# Patient Record
Sex: Male | Born: 1945 | Race: White | Hispanic: No | Smoking: Former smoker
Health system: Southern US, Community
[De-identification: ages and names within clinical notes are randomized; demographics above are authoritative.]

## PROBLEM LIST (undated history)

## (undated) ENCOUNTER — Emergency Department (HOSPITAL_COMMUNITY): Discharge status: Absent without leave | Payer: Medicare HMO

## (undated) DIAGNOSIS — G629 Polyneuropathy, unspecified: Secondary | ICD-10-CM

## (undated) DIAGNOSIS — I2510 Atherosclerotic heart disease of native coronary artery without angina pectoris: Secondary | ICD-10-CM

## (undated) DIAGNOSIS — I4891 Unspecified atrial fibrillation: Secondary | ICD-10-CM

## (undated) DIAGNOSIS — J1282 Pneumonia due to coronavirus disease 2019: Secondary | ICD-10-CM

## (undated) DIAGNOSIS — T4145XA Adverse effect of unspecified anesthetic, initial encounter: Secondary | ICD-10-CM

## (undated) DIAGNOSIS — C8590 Non-Hodgkin lymphoma, unspecified, unspecified site: Secondary | ICD-10-CM

## (undated) DIAGNOSIS — I10 Essential (primary) hypertension: Secondary | ICD-10-CM

## (undated) DIAGNOSIS — Z8701 Personal history of pneumonia (recurrent): Secondary | ICD-10-CM

## (undated) DIAGNOSIS — I214 Non-ST elevation (NSTEMI) myocardial infarction: Secondary | ICD-10-CM

## (undated) DIAGNOSIS — E119 Type 2 diabetes mellitus without complications: Secondary | ICD-10-CM

## (undated) DIAGNOSIS — N183 Chronic kidney disease, stage 3 unspecified: Secondary | ICD-10-CM

## (undated) DIAGNOSIS — G4730 Sleep apnea, unspecified: Secondary | ICD-10-CM

## (undated) DIAGNOSIS — U071 COVID-19: Secondary | ICD-10-CM

## (undated) DIAGNOSIS — E039 Hypothyroidism, unspecified: Secondary | ICD-10-CM

## (undated) DIAGNOSIS — E785 Hyperlipidemia, unspecified: Secondary | ICD-10-CM

## (undated) DIAGNOSIS — I447 Left bundle-branch block, unspecified: Secondary | ICD-10-CM

## (undated) HISTORY — DX: Non-ST elevation (NSTEMI) myocardial infarction: I21.4

## (undated) HISTORY — DX: Chronic kidney disease, stage 3 (moderate): N18.3

## (undated) HISTORY — DX: Non-Hodgkin lymphoma, unspecified, unspecified site: C85.90

## (undated) HISTORY — DX: Essential (primary) hypertension: I10

## (undated) HISTORY — DX: Chronic kidney disease, stage 3 unspecified: N18.30

## (undated) HISTORY — DX: Personal history of pneumonia (recurrent): Z87.01

## (undated) HISTORY — DX: Type 2 diabetes mellitus without complications: E11.9

## (undated) HISTORY — DX: Polyneuropathy, unspecified: G62.9

---

## 1990-08-03 HISTORY — PX: CHOLECYSTECTOMY: SHX55

## 2008-07-21 ENCOUNTER — Inpatient Hospital Stay (HOSPITAL_COMMUNITY): Admission: EM | Admit: 2008-07-21 | Discharge: 2008-07-29 | Payer: Self-pay | Admitting: Emergency Medicine

## 2008-07-21 ENCOUNTER — Ambulatory Visit: Payer: Self-pay | Admitting: Cardiovascular Disease

## 2008-07-23 ENCOUNTER — Ambulatory Visit: Payer: Self-pay | Admitting: Cardiology

## 2008-07-23 ENCOUNTER — Encounter (INDEPENDENT_AMBULATORY_CARE_PROVIDER_SITE_OTHER): Payer: Self-pay | Admitting: Internal Medicine

## 2010-12-16 NOTE — Group Therapy Note (Signed)
[name] [name], [name] [name]                 ACCOUNT NO.:  [id number]   MEDICAL RECORD NO.:  [id number]          PATIENT TYPE:  INP   LOCATION:  [id number]                          FACILITY:  [facility]   PHYSICIAN:  Audria Nine, M.D.DATE OF BIRTH:  August 19, 1945   DATE OF PROCEDURE:  07/24/2008  DATE OF DISCHARGE:                                 PROGRESS NOTE   SUBJECTIVE:  The patient continues to feel much better,  much stronger,  but he was still having problems with his blood sugar.  His sugars have  gone up as high as  450.  The patient is currently on Lantus insulin at  30 units.  We will increase his Lantus units dose and also start him on  glyburide.  Otherwise, he feels comfortable.  He denies any chest pain.   OBJECTIVE:  GENERAL:  Conscious, alert, comfortable, not in acute  distress.  Well oriented in time, place and person.  VITAL SIGNS:  Blood pressure is 104/55 with a pulse of 58, respirations  18, temperature 98.6 degrees Fahrenheit, oxygen saturation 95% on room  air.  HEENT:  Normocephalic, atraumatic.  Oral mucosa was moist.  No exudates  were noted.  NECK:  Supple.  No JVD, lymphadenopathy.  LUNGS:  Reduced air entry bilaterally.  No crackles or wheezing, no  rhonchi.  HEART:  S1-S2 regular, no S3, S4, gallops or rubs.  ABDOMEN:  Soft, nontender.  Bowel sounds positive.  No masses palpable,  was obese.  EXTREMITIES:  No edema.  CNS:  Exam was grossly intact.  No focal neurological deficits.   LABORATORY/DIAGNOSTIC DATA:  White blood cell count 8, hemoglobin of  14.9, hematocrit 42.7, platelet count 112.  Glucose was ranging between  402 to 595.  Sodium 131, potassium 3.2, chloride of 96, CO2 of 25, BUN  of 76, creatinine 4.39, calcium 7.1.  Urine output in the last 24 hours  has been about 5.2 L.   ASSESSMENT:  1. Hyperosmolar nonketotic hyperglycemia with altered mental      status/coma.  2. Acute renal failure with likely underlying chronic renal      insufficiency.  3. Acute bronchitis versus pneumonia.  4. 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   PLAN:  1. Will increase the patient's Lantus insulin to 50 units and start      him on glyburide.  2. Continue on Levaquin for now for suspected occult infection.  3. Continue on IV fluid hydration as per nephrology.  The patient is      also receiving Lasix.  He is making good urine, but his creatinine      is not improving.  He does not have any evidence of acidosis.  4. Continue him on Synthroid.  5. I again discussed with him importance of lifestyle changes.   DISPOSITION:  I think the patient will remain in the hospital until we  have determined that his BUN and creatinine are at his baseline and  likely to improve and also his blood sugars improve.      Audria Nine, M.D.  Electronically Signed  AM/MEDQ  D:  07/24/2008  T:  07/24/2008  Job:  [id number]

## 2010-12-16 NOTE — Discharge Summary (Signed)
[name] [name], [name] [name]                 ACCOUNT NO.:  [id number]   MEDICAL RECORD NO.:  [id number]          PATIENT TYPE:  INP   LOCATION:  A323                          FACILITY:  [facility]   PHYSICIAN:  Salem Caster, DO    DATE OF BIRTH:  Apr 04, 1946   DATE OF ADMISSION:  07/21/2008  DATE OF DISCHARGE:  12/27/2009LH                               DISCHARGE SUMMARY   DISCHARGE DIAGNOSES:  1. Acute-on-chronic renal failure.  2. Hypokalemia.  3. Newly diagnosed diabetes mellitus.  4. Hypertension.  5. History of hypothyroidism.   BRIEF HOSPITAL COURSE:  This is a 65 year old Caucasian male with  history of hypertension and hypothyroidism who was in usual state of  health to about a week prior and started having vomiting, polyuria,  polydipsia, and he is drinking multiple cans of orange juice and  [facility] and had some confusion.  The patient denied having chest  pain, diarrhea, headache, and did have some lightheaded and dizziness  and thought he might have a syncopal episode.  The patient was brought  in secondary to the symptoms.  Initial vitals showed a temperature of  99.8, blood pressure 123/83, heart rate 65, and respiratory rate 16.  He  was slightly confused and somnolent on admission.   INITIAL LABORATORY DATA:  A white count of 12.7, hemoglobin 16.7,  platelet count 168.  Sodium 123, potassium 4.9, chloride 83, bicarb 23,  glucose 1238, BUN 93, and creatinine 4.46.  His urinalysis showed  glycosuria, trace of blood, and few bacteria.  EKG showed sinus rhythm  and left axis deviation.   He was admitted for nonketotic hyperglycemic state.  He was placed on  insulin drip as well as IV fluids.  Also, Lantus was initiated.  Secondary to confusion, the patient was placed on neuro checks every 2  hours.  For his acute renal failure, he had some aggressive IV hydration  placed.  He was placed on his home medicines for his hypothyroidism and  he was started on DVT as well as GI  prophylaxis.  The patient did have a  chest x-ray with limited examination.  This showed some submental  atelectasis at the medial right lung base, subsequent cardiomegaly with  mild pulmonary vascular congestion, mild chronic interstitial lung  disease, and mild chronic bronchitic changes.  For the history of renal  failure, he had a renal ultrasound, which showed left kidney 10.5 cm on  left with a left-sided hydronephrosis or focal mass.  Right kidney and  bladder were not visualized.  Exam was limited by his body habitus.  Secondary to his renal failure, Nephrology was consulted.  They felt it  was an acute-on-chronic renal failure.  There was prerenal syndrome  versus acute tubular necrosis.  The patient had a diabetic nephropathy.  They agreed with hydration and diuresis and monitor his I's and O's.  The patient's renal failure has been improving on the daily basis with  hydration.  The patient was started on diuresis and had kidney urinary  output.  Secondary to his new onset of newly diagnosed diabetes, the  patient was placed on sliding scale as well as an oral hyperglycemics  and Lantus.  His hemoglobin A1c was found to be 11.8.  His blood sugars  have come down quite nicely, Lantus and sliding scale as well as oral  hyperglycemics.  The patient has watched the video regarding diabetes to  monitor his blood sugars at home.  At this time, the patient's blood  sugars are stable and with his family member at bedside who has already  got him a Glucometer as well as supplies who felt the patient can be  sent home with close follow up with the Health Department and follow up  with Nephrology.   MEDICATIONS ON DISCHARGE:  1. Loratadine 10 mg daily.  2. Levothyroxine.  3. Sodium 300 mcg daily.  4. Lopid 600 mg twice a day.  5. Glyburide 10 mg daily.  6. Lantus 60 units subcu daily.  7. K-Dur 40 mEq daily.  8. Lasix 40 mg twice a day.  9. Lisinopril 5 mg daily.   PHYSICAL  EXAMINATION:  VITAL SIGNS:  On discharge, temperature is 98.3,  pulse 45, respirations 18, and blood pressure is 120/75.  He is sating  99% on room air.   LABORATORY DATA:  Sodium 38, potassium is 3.4, chloride 107, CO2 22,  glucose 88, BUN 33, and creatinine 2.33.  The patient did have an  echocardiogram, which showed overall left ventricular systolic function  with normal estimated EF of 60%.   CONDITION ON DISCHARGE:  Stable.   DISPOSITION:  The patient will be discharged to home with family.   DISCHARGE INSTRUCTIONS:  The patient to maintain a 1800-2000-ADA diet.  He is to increase his activity slowly.  The patient is follow with the  Health Department regarding his diabetes followup and management as well  as referral to diabetic teaching in the 3-5 days.  The patient also will  be given contact information with Nephrology for followup within the  next 7 days.  The patient instructed to stay hydrated and stressed  dietary changes and exercise.  The patient returned to the emergency  room (ER) if he has any major similar complaints with the mental status  changes or extremely elevated blood sugars and/or call 911.  The patient  is to check his blood sugars at least 3 times daily.  As stated, family  has gotten him a Glucometer as well as supplies to check his blood  sugars.      Salem Caster, DO  Electronically Signed     SM/MEDQ  D:  07/29/2008  T:  07/29/2008  Job:  [id number]

## 2010-12-16 NOTE — Group Therapy Note (Signed)
[name] [name], [name] [name]                 ACCOUNT NO.:  [id number]   MEDICAL RECORD NO.:  [id number]          PATIENT TYPE:  INP   LOCATION:  [id number]                          FACILITY:  [facility]   PHYSICIAN:  Salem Caster, DO    DATE OF BIRTH:  Jan 04, 1946   DATE OF PROCEDURE:  07/28/2008  DATE OF DISCHARGE:                                 PROGRESS NOTE   PRIORITY PROGRESS NOTE   SUBJECTIVE:  Patient continues to improve.  Patient states that his  vision is blurry at times, but currently he is doing okay.  Patient  denies any chest pain, abdominal pain, or any other symptoms.  Overall,  he seems to be improving.   OBJECTIVE:  GENERAL:  He is awake and alert and comfortable in no acute  distress.  VITAL SIGNS:  Temperature is 98.2, pulse 55, respirations 16, Blood  pressure is 135/78, his saturation is 97% on room air.  CARDIOVASCULAR:  Regular rate and rhythm, no rubs or gallops or murmurs.  LUNGS:  Clear to auscultation bilaterally, no rhonchi or wheezing noted.  ABDOMEN:  Obese, soft, nontender, and nondistended, positive bowel  sounds.  EXTREMITIES:  No clubbing, cyanosis, or edema.   LABORATORY DATA:  Sodium is 138, potassium 3.4, chloride 103, CO2 is 26,  glucose 134, his BUN is 41, and creatinine 2.66.  His white count is  5.7, hemoglobin 13.9 with hematocrit 40.8, and platelet count is  125,000.   ASSESSMENT AND PLAN:  1. Newly diagnosed diabetes.  The patient's Lantus was recently      increased.  We will continue with Lantus and Glyburide at this      time.  2. For his acute renal failure.  It seems to improving.  Patient is      being followed by Nephrology, and they are also following his      intake and output (I's and O's) at this time.  3. For his acute bronchitis, the patient is on antibiotics.  We will      continue that at this time.  4. For his obesity, patient will need dietary changes as well as      increasing in exercise secondary to his obesity and his newly  diagnosed diabetes.  5. The patient has a history of hypertensive cardiomyopathy.   Anticipate the patient being discharged in the next 24 hours if okay  with Nephrology.  The patient will need close followup with his primary  care physician regarding his newly diagnosed diabetes for his  medications as well as referral to an ophthalmologist.      Salem Caster, DO  Electronically Signed     SM/MEDQ  D:  07/28/2008  T:  07/28/2008  Job:  [id number]

## 2010-12-16 NOTE — Group Therapy Note (Signed)
[name] [name], [name] [name]                 ACCOUNT NO.:  [id number]   MEDICAL RECORD NO.:  [id number]          PATIENT TYPE:  INP   LOCATION:  [id number]                          FACILITY:  [facility]   PHYSICIAN:  Audria Nine, M.D.DATE OF BIRTH:  Dec 07, 1945   DATE OF PROCEDURE:  07/27/2008  DATE OF DISCHARGE:                                 PROGRESS NOTE   SUBJECTIVE:  The patient feels well.  His blood sugars have improved  although still ranging in the 200s.  His insulin infusion is off and the  patient is now on Lantus.  I will be increasing the dose today.   OBJECTIVE:  Conscious, alert, comfortable, not in acute distress.  Well-  oriented in time, place and person.  VITAL SIGNS:  Blood pressure is 108/60 with a pulse of 50, respirations  20, temperature 97.4 degrees Fahrenheit.  Oxygen saturation was 96% on  room air.  HEENT EXAM:  Normocephalic, atraumatic.  Oral mucosa was moist.  No  exudates were noted.  NECK:  Supple.  No JVD or lymphadenopathy.  LUNGS:  Clear clinically with good air entry bilaterally.  HEART:  S1 and S2, regular rate, no S4 gallops or rubs.  ABDOMEN:  Obese but soft, nontender.  Bowel sounds positive.  EXTREMITIES:  No edema.   LAB REVIEW/DIAGNOSTIC DATA:  Sodium is 139, potassium 3.3, chloride 103,  CO2 was 26, glucose was 170, BUN of 49, creatinine was 2.95.  Blood  sugars are ranging between 220-293.   ASSESSMENT:  1. Hyperosmolar nonketotic hyperglycemia:  Now resolved.  2. Acute renal failure.  3. Chronic renal insufficiency, improving.  4. Acute bronchitis versus pneumonia.  5. Hypertensive cardiomyopathy  6. Morbid obesity.  7. Newly diagnosed diabetes mellitus.   PLAN:  1. Will increase the patient's Lantus insulin to 60 units today, will      increase his Glyburide to 10 mg once a day.  2. Will observe and will monitor the patient for the next 24 hours to      ensure that his blood sugars are at least below 200 for 24 hours      prior to  discharge.  3. Will continue on oral Levaquin for bronchitis.  4. Continue on IV fluid hydration per nephrology for his renal      insufficiency.  This has significantly improved.  This is being      managed by Dr. Lowanda Foster.   DISPOSITION:  The patient will remain in the hospital until his blood  sugars improved.  His BUN and creatinine are also showing some  improvement.  I suspect the patient may be able to discharge in next 24-  48 hours.      Audria Nine, M.D.  Electronically Signed     AM/MEDQ  D:  07/27/2008  T:  07/28/2008  Job:  [id number]

## 2010-12-16 NOTE — Consult Note (Signed)
[name] [name], [name] [name]                 ACCOUNT NO.:  [id number]   MEDICAL RECORD NO.:  [id number]          PATIENT TYPE:  INP   LOCATION:  [id number]                          FACILITY:  [facility]   PHYSICIAN:  Alison Murray, M.D.DATE OF BIRTH:  01-Apr-1946   DATE OF CONSULTATION:  DATE OF DISCHARGE:                                 CONSULTATION   ATTENDING PHYSICIAN:  __________   REASON FOR CONSULTATION:  Renal failure.   HISTORY OF PRESENT ILLNESS:  [name] [name] is a 65 year old gentleman who  had polyuria, confusion, nausea, or vomiting for a couple of days.  He  was brought to the hospital and was found to have significantly elevated  blood sugar and also renal failure, presently consulted for.  According  to the patient, he has high blood pressure for many years, but his blood  sugar has been risen.  He is not taking any medication.  However, in the  last couple of days, he has been having __________ and he has also some  nausea and diarrhea.  Hence, he was brought to the hospital.  He denies  any history of kidney stones.   PAST MEDICAL HISTORY:  He has history of hypertension.  He has history  of swelling of the legs, history of hypothyroidism and also history of  high blood sugar.   PAST SURGICAL HISTORY:  He has history of cholecystectomy.   MEDICATIONS:  D5 half-normal saline 50 mL per hour.  He is getting some  insulin.  He is also on Levaquin 250 mg IV, __________  Synthroid 300  mcg p.o. daily, Protonix 40 mg IV, other medications are p.r.n.   ALLERGIES:  No allergies.   SOCIAL HISTORY:  History of drug use.  He lives with __________.  Currently denies smoking and __________.   FAMILY HISTORY:  No history of renal failure, but there is a strong  family history of diabetes.   REVIEW OF SYSTEMS:  Presently, he feels okay.  He does not have any  nausea or vomiting.  He feels weak.  He denies shortness of breath.   PHYSICAL EXAMINATION:  GENERAL:  The patient is alert, in no  apparent  distress.  VITAL SIGNS:  His heart rate is 65, blood pressure 117/65.  CHEST:  Clear to auscultation.  HEART:  Regular rate and rhythm.  ABDOMEN:  Soft, positive bowel sounds.  EXTREMITIES:  No edema.   LABORATORY DATA:  __________.  Sodium 177, potassium 3.8, BUN is 90,  creatinine is 4.4.  His albumin is 3.1.  His white blood cell count when  he came yesterday was 17.2.  Hemoglobin 16.7, hematocrit 48.2.  His  blood sugar was as high as 670.  Calcium 8.5.  UA; specific gravity  1.01, glucose 1000, blood trace, few bacteria, and no protein.   ASSESSMENT:  1. Renal insufficiency, probably was acute, however, __________      deficiency cannot be ruled out.  Since the patient has      hyperglycemia, polyuria, and polydipsia, probably we may be dealing      with prerenal syndrome versus acute  tubular necrosis.  Since the      patient has diabetic nephropathy __________ and he does not have      any proteinuria.  2. History of hyperglycemia.  3. History of leukocytosis, etiology at this moment is not clear.      Need to rule out pneumonia.  Since she has also nausea, vomiting,      and diarrhea, bacterial gastroenteritis also is entertained.  4. History of hypertension:  Blood pressure seems to be controlled      very well.  5. History of cardiomegaly.  6. History of obesity.   RECOMMENDATIONS:  I agree with hydration.  We will do ultrasound of the  kidneys.  We will try to use some diuretics __________ urine output.  I  will continue the other treatments.  We will follow the patient.      Alison Murray, M.D.  Electronically Signed     BB/MEDQ  D:  07/23/2008  T:  07/23/2008  Job:  [id number]

## 2010-12-16 NOTE — Group Therapy Note (Signed)
[name] [name], [name] [name]                 ACCOUNT NO.:  [id number]   MEDICAL RECORD NO.:  [id number]          PATIENT TYPE:  INP   LOCATION:  IC04                          FACILITY:  [facility]   PHYSICIAN:  Audria Nine, M.D.DATE OF BIRTH:  Aug 03, 1946   DATE OF PROCEDURE:  07/22/2008  DATE OF DISCHARGE:                                 PROGRESS NOTE   SUBJECTIVE:  The patient feels much better.  He is more awake now.  The  patient was admitted to the hospital due to severe hyperglycemia with  blood sugars over 1000.  The patient had no ketones.  The patient is not  a known diabetic.  He denies any chest pain or abdominal pain.  He just  feels uncomfortable on the bed.  The patient's daughter was here with  him.  He has not had any paIN episodes since being in the hospital.   OBJECTIVE:  GENERAL:  Conscious, alert, comfortable, not in acute  distress.  Well-oriented in time, person and place.  VITAL SIGNS:  Blood pressure is 149/82, temperature 98.5 degrees  Fahrenheit, oxygen saturation is 98% on 2 liters, respirations 13, pulse  of 74.  HEENT:  Normocephalic, atraumatic.  Oral mucosa was dry.  No exudates  were noted.  NECK:  Supple.  No JVD or lymphadenopathy.  LUNGS:  Reduced air entry bilaterally.  No crackles or wheezing or  rhonchi was heard at the bases.  ABDOMEN:  Abdomen was obese but soft, nontender.  Bowel sounds positive.  EXTREMITIES:  No edema.  The patient has evidence of chronic stasis  dermatitis.  CNS:  Exam was grossly intact.   LABORATORY DIAGNOSTIC DATA:  White blood cell count 17.2, hemoglobin of  16.7, hematocrit 48.5, platelet count 153.  Sodium 137, potassium 3.8,  chloride of 97, CO2 was 26, BUN of 90, creatinine was 4.48, glucose was  362.  Cardiac enzymes were negative.  Chest x-ray shows chronic  bronchitic changes, possibly some pneumonia.   ASSESSMENT:  1. Hyperosmolar nonketotic hyperglycemia with altered mental      status/coma.  2. Acute renal  failure, possibly the patient may have some underlying      chronic renal insufficiency.  3. Acute bronchitis versus pneumonia.  4. Probable cardiomyopathy, possibly hypertensive cardiomyopathy      related to obesity.   PLAN:  1. The patient's blood sugars have improved.  We will continue him on      insulin infusion at this time.  We will continue to hold Lantus      insulin.  I do not have a hemoglobin A1c on him yet.  Likely will      need insulin long-term and he will also need diabetic education.  2. The patient has some leukocytosis and had a little bit of low-grade      fever.  The patient is currently on Levaquin.  His white blood      count has improved.  We will continue this for now.  3. He has acute renal failure.  I cannot exclude the possibility of  underlying chronic renal problems.  We will check an ultrasound of      his kidneys in the morning.  Potassium is normal.  CO2 is normal.      His pH is normal.  No indication for hemodialysis at this time.  4. History of hypothyroidism.  The patient is on Synthroid and we will      await his TSH level.  5. Morbid obesity.  Discussed with him the importance of lifestyle      changes including dietary restrictions.  The patient said he would      try to make some changes.  I think we can also try to accomplish      that with diabetic education.  6. History of cardiomegaly.  The patient likely has underlying      cardiomyopathy.  He does have a longstanding history of      hypertension.  The patient is awaiting further evaluation with an      echocardiogram in the morning.   DISPOSITION:  A patient with multiple comorbidities now diagnosed with  diabetes mellitus.  The patient comes in hyperosmolar nonketotic coma.  The patient is doing better.      Audria Nine, M.D.  Electronically Signed     AM/MEDQ  D:  07/22/2008  T:  07/22/2008  Job:  [id number]

## 2010-12-16 NOTE — Group Therapy Note (Signed)
NAMESENAN, LANDSIEDEL                 ACCOUNT NO.:  [id number]   MEDICAL RECORD NO.:  [id number]          PATIENT TYPE:  INP   LOCATION:  [id number]                          FACILITY:  [facility]   PHYSICIAN:  Audria Nine, M.D.DATE OF BIRTH:  [birth year]   DATE OF PROCEDURE:  07/26/2008  DATE OF DISCHARGE:                                 PROGRESS NOTE   SUBJECTIVE:  The patient feels very well.  His blood sugars have  improved.  His insulin infusion was stopped yesterday.  The patient's  blood sugars have remained in satisfactory range.   OBJECTIVE:  GENERAL:  Conscious, alert, comfortable, not in acute  distress.  Well oriented in time, place and person.  VITAL SIGNS:  Blood pressure is 119/67, pulse was 61, respirations 18,  temperature 97.6 degrees Fahrenheit, oxygen saturation  was 98% on room  air.  Blood sugars are now ranging between 99 and 148.  HEENT:  Normocephalic, atraumatic.  Oral mucosa was moist.  No exudates.  NECK:  Supple.  No JVD or lymphadenopathy.  LUNGS:  Clear with good air entry bilaterally.  HEART:  S1 and S2 regular.  No murmurs, gallops, or rubs.  ABDOMEN:  Soft, obese, nontender.  Bowel sounds positive.  EXTREMITIES:  No edema.   LABORATORY DATA:  White blood cell count was 5.6, hemoglobin of 14.7,  hematocrit 43.8, platelet count was 115 with no left shift.  Sodium 142,  potassium 3.5, chloride of 103, CO2 was 28, glucose 142, BUN of 56,  creatinine was 3.29.  The patient's hemoglobin A1c is almost 12.   ASSESSMENT/PLAN:  1. Hyperosmolar nonketotic hyperglycemic with altered mental status /      coma.  2. Acute renal failure with likely underlying chronic renal      insufficiency.  3. Acute bronchitis versus pneumonia.  4. Hypertensive cardiomyopathy, possibly related to obesity.  5. Newly diagnosed diabetes mellitus.   PLAN:  1. The patient seems to be tolerating the 15 units of Lantus very well      and will continue on this for now and support him with  sliding      scale.  2. Continue on Levaquin but switch it to oral.  3. Will continue IV fluid hydration per nephrology.  The patient's BUN      and creatinine are improving every day.  Will defer subsequent plan      to Dr. Lowanda Foster.   DISPOSITION:  The patient was remain in the hospital until we are able  to get his BUN and creatinine down.  The patient likely home in the next  24-48 hours if his blood sugars remain stable.      Audria Nine, M.D.  Electronically Signed     AM/MEDQ  D:  07/26/2008  T:  07/26/2008  Job:  [id number]

## 2010-12-16 NOTE — H&P (Signed)
NAMENITAI, CONSER                 ACCOUNT NO.:  [id number]   MEDICAL RECORD NO.:  [id number]          PATIENT TYPE:  EMS   LOCATION:  ED                            FACILITY:  [facility]   PHYSICIAN:  Bonnielee Haff, MD     DATE OF BIRTH:  23-Aug-1945   DATE OF ADMISSION:  07/21/2008  DATE OF DISCHARGE:  LH                              HISTORY & PHYSICAL   PRIORITY ADMISSION HISTORY AND PHYSICAL   PRIMARY CARE DOCTOR:  He does not have one.  [facility]   ADMITTING DIAGNOSES:  1. Hyperglycemia, nonketotic.  2. Altered mental status as a result of #1.  3. Acute renal failure.  4. Possible pneumonia.  5. Abnormal electrocardiogram and cardiomegaly requiring further      evaluation.   CHIEF COMPLAINT:  Confusion and vomiting for the last many days.   HISTORY OF PRESENT ILLNESS:  Patient is a 65 year old Caucasian male,  who has history of hypertension and hypothyroidism, who was in his usual  state of health until about a week ago when he started having vomiting,  polyuria, polydipsia, he was drinking multiple cartons of orange juice  and [facility], and he was feeling confused.  The patient still is a  little bit confused and unable to provide any more history.  Denies any  fever or chills at home.  Denies any diarrhea, denies any headache.  He  says he has been dizzy and lightheaded, and his daughter thinks that he  may have had a syncopal episode as well at home.  History is limited in  this individual at this time. The patient's daughter tells me that he  did have a high blood sugar reading a few months ago, but he never  followed up on that.   MEDICATIONS AT HOME:  He is on:  1. Verapamil 80 mg daily.  2. Lisinopril/HCTZ 20/25 once a day.  3. Furosemide 40 mg daily; this was initiated on November 5th for      lower extremity edema.  4. Loratadine 10 mg daily.  5. Levothyroxine 300 mcg daily.   ALLERGIES:  No known drug  allergies.   PAST MEDICAL HISTORY:  1. He has had hypertension and hypothyroidism for all his life,      according to the patient.  2. He has had a cholecystectomy over 20 years ago, but no other      surgeries.   He denies any history of heart disease, no heart attacks, no strokes, no  lung disease.   SOCIAL HISTORY:  He lives in [place] with his girlfriend, denies any  smoking use at this time, quit 5 years ago, no illicit drug use, no  alcohol use, independently usually with his daily activities.   FAMILY HISTORY:  Positive for thyroid disease in multiple family  members, type 2 diabetes, insulin-dependent diabetes.   REVIEW OF SYSTEMS:  GENERAL SYSTEM:  Positive for weakness, malaise, and  confusion.  HEENT:  Unremarkable.  CARDIOVASCULAR:  Unremarkable.  Denies any chest pain or shortness of breath.  RESPIRATORY:  Unremarkable.  GI:  As in HPI.  GU:  Unremarkable.  NEUROLOGICAL:  As in  HPI.  PSYCHIATRIC:  Unremarkable.  DERMATOLOGIC:  Unremarkable.  MUSCULOSKELETAL:  Unremarkable.  Again, this review of systems is  limited because of the patient's mental status.   PHYSICAL EXAMINATION:  VITAL SIGNS:  Temperature 99.8, blood pressure  123/83, heart rate 65, respiratory rate 16, saturation 94% on room air.  GENERAL:  This is an obese white male, confused and somnolent but easily  arousable and in no distress.  HEENT:  Pupils are equally reacting, no pallor or icterus is present,  oral mucous membrane is dry, no oral lesions are noted.  NECK:  Soft and supple, no thyromegaly is appreciated.  LUNGS:  Clear to auscultation bilaterally, no wheezing, rales, or  rhonchi.  CARDIOVASCULAR:  S1 S2 is normal, regular, no murmurs appreciated, no S3  S4, no rubs, and no bruits.  ABDOMEN:  Soft, nontender, and nondistended, bowel sounds are present,  no masses or organomegaly is appreciated.  MUSCULOSKELETAL:  Exam unremarkable.  NEUROLOGIC:  He is somnolent, arousable, pupils are  equally reactive, no  cranial deficits are present, motor strength is good bilaterally 5/5, no  other focal deficits are noted.   LABORATORY DATA:  He had a CBC, which showed a white count of 12.7,  hemoglobin is 16.7, platelet count is 168, 80% neutrophils identified  and no bands reported.  He had a BMET, which showed a sodium of 123, a  potassium of 4.9, chloride is 83, bicarb 23, glucose was 1238, BUN is  93, creatinine is 4.46.  Total bilirubin is 1.3, alk-phos is 94, other  LFTs are normal.  Acetone was negative.  Cardiac markers unremarkable  except for a mildly elevated myoglobin of 413.  UA showed glucosuria,  trace blood, a few bacteria; otherwise, unremarkable.  Repeat BMET  showed a decrease in glucose to 1081, improvement in BUN to 88 and  creatinine is 4.2, corrected sodium is about 141.   He had an EKG which showed a sinus rhythm with a left axis deviation,  intervals are abnormal with evidence for right bundle branch block,  interventricular conduction deficits may also be present.  I do not  appreciate any Q-waves in the PA leads.  They could be Q, but I think I  see a P-wave prior to the Q.  No significant ST or T-wave changes are  noted.  In all, this is an abnormal EKG.  There are some T changes in  leads aVL and V1.  No older EKGs are available to compare.  He had a  chest x-ray, which showed subsegmental atelectasis in the medial right  lung base, cardiomegaly, mild pulmonary vascular congestion, mild  bronchial interstitial lung disease, and mild chronic bronchitic changes  were also noted.   ASSESSMENT:  This is a 65 year old Caucasian male with hypertension and  hypothyroidism, who presents with confusion and a history of polydipsia,  polyuria, and has hyperglycemia in nonketotic state.   PLAN:  1. Nonketotic hyperglycemic state.  He is on an insulin drip, which      will be continued, IV fluids will be given, he has already been      given 3 L.  I will be  somewhat careful with his hydration because      of the cardiomegaly and abnormal EKG.  This patient could have      cardiomyopathy.  We will keep him NPO for now, change his  fluids to      D5 and his blood sugar goes below 250, and because of the above-      mentioned issues I will monitor him closely in the intensive care      unit for tonight.  HGB A1c will be checked.  Once his blood sugars      are target, Lantus will be initiated.  2. Confusion and altered mental status likely because of his      hyperglycemia.  Neuro checks will be done q.2 hours.  At this time,      there is no role for a CT scan.  We will follow and monitor him      very closely.  3. Leukocytosis and abnormal chest x-ray.  He is also mildly febrile      so I have initiated Levaquin on him.  He might have aspirated and      as a result may have developed aspiration pneumonitis.  4. Acute renal failure should improve with aggressive IV hydration.      We will check an ultrasound of his kidneys hopefully in the      morning.  His electrolytes are okay.  5. Hyponatremia is because of hyperglycemia.  A corrected value is      normal.  6. History of hypothyroidism.  We will check his TSH and free T4 as      well, and we will continue with the dose of Levothyroxine.  7. Abnormal EKG and cardiomegaly on chest x-ray.  We will check an      echocardiogram on Monday.  We will rule him out for acute coronary      syndrome with serial cardiac enzymes.  8. DVT prophylaxis will be initiated.   Further management decisions will depend on the results of further  testing and patient's response to treatment.      Bonnielee Haff, MD  Electronically Signed     GK/MEDQ  D:  07/21/2008  T:  07/21/2008  Job:  [id number]

## 2011-03-09 ENCOUNTER — Ambulatory Visit
Admission: RE | Admit: 2011-03-09 | Discharge: 2011-03-09 | Disposition: A | Discharge status: Home / Self Care | Payer: Medicare Other | Source: Ambulatory Visit | Attending: Family Medicine | Admitting: Family Medicine

## 2011-03-09 ENCOUNTER — Other Ambulatory Visit: Payer: Self-pay | Admitting: Family Medicine

## 2011-03-09 ENCOUNTER — Other Ambulatory Visit: Payer: Self-pay | Admitting: *Deleted

## 2011-03-09 DIAGNOSIS — M25569 Pain in unspecified knee: Secondary | ICD-10-CM

## 2011-05-08 LAB — GLUCOSE, CAPILLARY
Glucose-Capillary: 104 mg/dL — ABNORMAL HIGH (ref 70–99)
Glucose-Capillary: 106 mg/dL — ABNORMAL HIGH (ref 70–99)
Glucose-Capillary: 114 mg/dL — ABNORMAL HIGH (ref 70–99)
Glucose-Capillary: 118 mg/dL — ABNORMAL HIGH (ref 70–99)
Glucose-Capillary: 132 mg/dL — ABNORMAL HIGH (ref 70–99)
Glucose-Capillary: 135 mg/dL — ABNORMAL HIGH (ref 70–99)
Glucose-Capillary: 135 mg/dL — ABNORMAL HIGH (ref 70–99)
Glucose-Capillary: 139 mg/dL — ABNORMAL HIGH (ref 70–99)
Glucose-Capillary: 140 mg/dL — ABNORMAL HIGH (ref 70–99)
Glucose-Capillary: 140 mg/dL — ABNORMAL HIGH (ref 70–99)
Glucose-Capillary: 142 mg/dL — ABNORMAL HIGH (ref 70–99)
Glucose-Capillary: 144 mg/dL — ABNORMAL HIGH (ref 70–99)
Glucose-Capillary: 145 mg/dL — ABNORMAL HIGH (ref 70–99)
Glucose-Capillary: 155 mg/dL — ABNORMAL HIGH (ref 70–99)
Glucose-Capillary: 162 mg/dL — ABNORMAL HIGH (ref 70–99)
Glucose-Capillary: 165 mg/dL — ABNORMAL HIGH (ref 70–99)
Glucose-Capillary: 169 mg/dL — ABNORMAL HIGH (ref 70–99)
Glucose-Capillary: 170 mg/dL — ABNORMAL HIGH (ref 70–99)
Glucose-Capillary: 173 mg/dL — ABNORMAL HIGH (ref 70–99)
Glucose-Capillary: 184 mg/dL — ABNORMAL HIGH (ref 70–99)
Glucose-Capillary: 185 mg/dL — ABNORMAL HIGH (ref 70–99)
Glucose-Capillary: 201 mg/dL — ABNORMAL HIGH (ref 70–99)
Glucose-Capillary: 243 mg/dL — ABNORMAL HIGH (ref 70–99)
Glucose-Capillary: 252 mg/dL — ABNORMAL HIGH (ref 70–99)
Glucose-Capillary: 265 mg/dL — ABNORMAL HIGH (ref 70–99)
Glucose-Capillary: 293 mg/dL — ABNORMAL HIGH (ref 70–99)
Glucose-Capillary: 297 mg/dL — ABNORMAL HIGH (ref 70–99)
Glucose-Capillary: 305 mg/dL — ABNORMAL HIGH (ref 70–99)
Glucose-Capillary: 310 mg/dL — ABNORMAL HIGH (ref 70–99)
Glucose-Capillary: 324 mg/dL — ABNORMAL HIGH (ref 70–99)
Glucose-Capillary: 334 mg/dL — ABNORMAL HIGH (ref 70–99)
Glucose-Capillary: 355 mg/dL — ABNORMAL HIGH (ref 70–99)
Glucose-Capillary: 404 mg/dL — ABNORMAL HIGH (ref 70–99)
Glucose-Capillary: 433 mg/dL — ABNORMAL HIGH (ref 70–99)
Glucose-Capillary: 492 mg/dL — ABNORMAL HIGH (ref 70–99)
Glucose-Capillary: 510 mg/dL (ref 70–99)

## 2011-05-08 LAB — DIFFERENTIAL
Basophils Absolute: 0 10*3/uL (ref 0.0–0.1)
Basophils Absolute: 0.1 10*3/uL (ref 0.0–0.1)
Basophils Absolute: 0.1 10*3/uL (ref 0.0–0.1)
Basophils Absolute: 0.1 10*3/uL (ref 0.0–0.1)
Basophils Relative: 1 % (ref 0–1)
Basophils Relative: 1 % (ref 0–1)
Basophils Relative: 1 % (ref 0–1)
Basophils Relative: 1 % (ref 0–1)
Eosinophils Absolute: 0.1 10*3/uL (ref 0.0–0.7)
Eosinophils Absolute: 0.3 10*3/uL (ref 0.0–0.7)
Eosinophils Absolute: 0.4 10*3/uL (ref 0.0–0.7)
Eosinophils Relative: 2 % (ref 0–5)
Eosinophils Relative: 5 % (ref 0–5)
Lymphocytes Relative: 37 % (ref 12–46)
Lymphs Abs: 1.7 10*3/uL (ref 0.7–4.0)
Lymphs Abs: 2.1 10*3/uL (ref 0.7–4.0)
Monocytes Absolute: 0.5 10*3/uL (ref 0.1–1.0)
Monocytes Relative: 7 % (ref 3–12)
Monocytes Relative: 9 % (ref 3–12)
Monocytes Relative: 9 % (ref 3–12)
Neutro Abs: 10.2 10*3/uL — ABNORMAL HIGH (ref 1.7–7.7)
Neutro Abs: 2.7 10*3/uL (ref 1.7–7.7)
Neutro Abs: 4.9 10*3/uL (ref 1.7–7.7)
Neutrophils Relative %: 48 % (ref 43–77)
Neutrophils Relative %: 61 % (ref 43–77)
Neutrophils Relative %: 63 % (ref 43–77)
Neutrophils Relative %: 80 % — ABNORMAL HIGH (ref 43–77)

## 2011-05-08 LAB — BASIC METABOLIC PANEL
BUN: 33 mg/dL — ABNORMAL HIGH (ref 6–23)
BUN: 41 mg/dL — ABNORMAL HIGH (ref 6–23)
BUN: 68 mg/dL — ABNORMAL HIGH (ref 6–23)
BUN: 76 mg/dL — ABNORMAL HIGH (ref 6–23)
BUN: 90 mg/dL — ABNORMAL HIGH (ref 6–23)
CO2: 24 mEq/L (ref 19–32)
CO2: 24 mEq/L (ref 19–32)
CO2: 28 mEq/L (ref 19–32)
Calcium: 6.9 mg/dL — ABNORMAL LOW (ref 8.4–10.5)
Calcium: 7.1 mg/dL — ABNORMAL LOW (ref 8.4–10.5)
Calcium: 7.4 mg/dL — ABNORMAL LOW (ref 8.4–10.5)
Calcium: 7.8 mg/dL — ABNORMAL LOW (ref 8.4–10.5)
Calcium: 8.3 mg/dL — ABNORMAL LOW (ref 8.4–10.5)
Chloride: 102 mEq/L (ref 96–112)
Chloride: 88 mEq/L — ABNORMAL LOW (ref 96–112)
Creatinine, Ser: 2.66 mg/dL — ABNORMAL HIGH (ref 0.4–1.5)
Creatinine, Ser: 2.95 mg/dL — ABNORMAL HIGH (ref 0.4–1.5)
Creatinine, Ser: 3.73 mg/dL — ABNORMAL HIGH (ref 0.4–1.5)
Creatinine, Ser: 4.39 mg/dL — ABNORMAL HIGH (ref 0.4–1.5)
GFR calc Af Amer: 16 mL/min — ABNORMAL LOW (ref >60)
GFR calc Af Amer: 17 mL/min — ABNORMAL LOW (ref >60)
GFR calc Af Amer: 17 mL/min — ABNORMAL LOW (ref >60)
GFR calc Af Amer: 35 mL/min — ABNORMAL LOW (ref >60)
GFR calc non Af Amer: 13 mL/min — ABNORMAL LOW (ref >60)
GFR calc non Af Amer: 14 mL/min — ABNORMAL LOW (ref >60)
GFR calc non Af Amer: 22 mL/min — ABNORMAL LOW (ref >60)
GFR calc non Af Amer: 25 mL/min — ABNORMAL LOW (ref >60)
GFR calc non Af Amer: 29 mL/min — ABNORMAL LOW (ref >60)
Glucose, Bld: 115 mg/dL — ABNORMAL HIGH (ref 70–99)
Glucose, Bld: 142 mg/dL — ABNORMAL HIGH (ref 70–99)
Glucose, Bld: 170 mg/dL — ABNORMAL HIGH (ref 70–99)
Potassium: 3.4 mEq/L — ABNORMAL LOW (ref 3.5–5.1)
Potassium: 3.4 mEq/L — ABNORMAL LOW (ref 3.5–5.1)
Potassium: 3.5 mEq/L (ref 3.5–5.1)
Potassium: 3.8 mEq/L (ref 3.5–5.1)
Potassium: 4.4 mEq/L (ref 3.5–5.1)
Sodium: 126 mEq/L — ABNORMAL LOW (ref 135–145)
Sodium: 137 mEq/L (ref 135–145)
Sodium: 138 mEq/L (ref 135–145)
Sodium: 139 mEq/L (ref 135–145)
Sodium: 142 mEq/L (ref 135–145)

## 2011-05-08 LAB — LIPID PANEL
HDL: 27 mg/dL — ABNORMAL LOW (ref >39)
Total CHOL/HDL Ratio: 4.7 RATIO
Triglycerides: 362 mg/dL — ABNORMAL HIGH (ref <150)
VLDL: 72 mg/dL — ABNORMAL HIGH (ref 0–40)

## 2011-05-08 LAB — CBC
HCT: 43.8 % (ref 39.0–52.0)
HCT: 48.5 % (ref 39.0–52.0)
HCT: 51.4 % (ref 39.0–52.0)
Hemoglobin: 14.7 g/dL (ref 13.0–17.0)
Hemoglobin: 14.9 g/dL (ref 13.0–17.0)
Hemoglobin: 16.7 g/dL (ref 13.0–17.0)
Hemoglobin: 16.7 g/dL (ref 13.0–17.0)
MCHC: 32.5 g/dL (ref 30.0–36.0)
MCHC: 33.7 g/dL (ref 30.0–36.0)
MCHC: 34.2 g/dL (ref 30.0–36.0)
MCHC: 34.8 g/dL (ref 30.0–36.0)
MCV: 91.2 fL (ref 78.0–100.0)
Platelets: 103 10*3/uL — ABNORMAL LOW (ref 150–400)
Platelets: 112 10*3/uL — ABNORMAL LOW (ref 150–400)
Platelets: 125 10*3/uL — ABNORMAL LOW (ref 150–400)
Platelets: 183 10*3/uL (ref 150–400)
RBC: 4.53 MIL/uL (ref 4.22–5.81)
RBC: 5.31 MIL/uL (ref 4.22–5.81)
RDW: 13.4 % (ref 11.5–15.5)
RDW: 13.4 % (ref 11.5–15.5)
RDW: 13.5 % (ref 11.5–15.5)
RDW: 13.6 % (ref 11.5–15.5)
RDW: 14.5 % (ref 11.5–15.5)
WBC: 17.2 10*3/uL — ABNORMAL HIGH (ref 4.0–10.5)
WBC: 5.7 10*3/uL (ref 4.0–10.5)

## 2011-05-08 LAB — URINALYSIS, ROUTINE W REFLEX MICROSCOPIC
Glucose, UA: >1000 mg/dL — AB
Ketones, ur: NEGATIVE mg/dL
Leukocytes, UA: NEGATIVE
Nitrite: NEGATIVE
Protein, ur: NEGATIVE mg/dL
Urobilinogen, UA: 0.2 mg/dL (ref 0.0–1.0)

## 2011-05-08 LAB — POCT I-STAT, CHEM 8
Calcium, Ion: 0.86 mmol/L — ABNORMAL LOW (ref 1.12–1.32)
HCT: 52 % (ref 39.0–52.0)
Sodium: 121 mEq/L — ABNORMAL LOW (ref 135–145)
TCO2: 20 mmol/L (ref 0–100)

## 2011-05-08 LAB — OSMOLALITY: Osmolality: 370 mOsm/kg — ABNORMAL HIGH (ref 275–300)

## 2011-05-08 LAB — ANTISTREPTOLYSIN O TITER: ASO: 25 IU/mL (ref 0–116)

## 2011-05-08 LAB — GLUCOSE, RANDOM
Glucose, Bld: 383 mg/dL — ABNORMAL HIGH (ref 70–99)
Glucose, Bld: 429 mg/dL — ABNORMAL HIGH (ref 70–99)
Glucose, Bld: 478 mg/dL — ABNORMAL HIGH (ref 70–99)
Glucose, Bld: 525 mg/dL (ref 70–99)
Glucose, Bld: 670 mg/dL (ref 70–99)

## 2011-05-08 LAB — CARDIAC PANEL(CRET KIN+CKTOT+MB+TROPI)
CK, MB: 1.7 ng/mL (ref 0.3–4.0)
CK, MB: 3.5 ng/mL (ref 0.3–4.0)
Relative Index: 1 (ref 0.0–2.5)
Relative Index: 1.3 (ref 0.0–2.5)
Relative Index: INVALID (ref 0.0–2.5)
Total CK: 93 U/L (ref 7–232)
Troponin I: 0.03 ng/mL (ref 0.00–0.06)
Troponin I: 0.04 ng/mL (ref 0.00–0.06)

## 2011-05-08 LAB — HEPATIC FUNCTION PANEL
ALT: 13 U/L (ref 0–53)
Bilirubin, Direct: 0.2 mg/dL (ref 0.0–0.3)
Indirect Bilirubin: 0.6 mg/dL (ref 0.3–0.9)
Total Bilirubin: 0.8 mg/dL (ref 0.3–1.2)

## 2011-05-08 LAB — COMPREHENSIVE METABOLIC PANEL
ALT: 13 U/L (ref 0–53)
Alkaline Phosphatase: 94 U/L (ref 39–117)
BUN: 93 mg/dL — ABNORMAL HIGH (ref 6–23)
CO2: 23 mEq/L (ref 19–32)
Calcium: 8.5 mg/dL (ref 8.4–10.5)
GFR calc non Af Amer: 14 mL/min — ABNORMAL LOW (ref >60)
Glucose, Bld: 1238 mg/dL (ref 70–99)
Sodium: 123 mEq/L — ABNORMAL LOW (ref 135–145)

## 2011-05-08 LAB — C4 COMPLEMENT: Complement C4, Body Fluid: 21 mg/dL (ref 16–47)

## 2011-05-08 LAB — URINE MICROSCOPIC-ADD ON

## 2011-05-08 LAB — TSH: TSH: 1.009 u[IU]/mL (ref 0.350–4.500)

## 2011-05-08 LAB — C3 COMPLEMENT: C3 Complement: 100 mg/dL (ref 88–201)

## 2011-05-08 LAB — ANA: Anti Nuclear Antibody(ANA): NEGATIVE

## 2011-05-08 LAB — POCT CARDIAC MARKERS

## 2011-05-08 LAB — KETONES, QUALITATIVE: Acetone, Bld: NEGATIVE

## 2011-10-23 ENCOUNTER — Encounter (HOSPITAL_COMMUNITY): Payer: Self-pay | Admitting: Pharmacy Technician

## 2011-10-28 ENCOUNTER — Ambulatory Visit (HOSPITAL_COMMUNITY)
Admission: RE | Admit: 2011-10-28 | Discharge: 2011-10-28 | Disposition: A | Discharge status: Home / Self Care | Payer: Medicare Other | Source: Ambulatory Visit | Attending: Orthopedic Surgery | Admitting: Orthopedic Surgery

## 2011-10-28 ENCOUNTER — Encounter (HOSPITAL_COMMUNITY)
Admission: RE | Admit: 2011-10-28 | Discharge: 2011-10-28 | Disposition: A | Discharge status: Home / Self Care | Payer: Medicare Other | Source: Ambulatory Visit | Attending: Orthopedic Surgery | Admitting: Orthopedic Surgery

## 2011-10-28 ENCOUNTER — Encounter (HOSPITAL_COMMUNITY): Payer: Self-pay

## 2011-10-28 DIAGNOSIS — Z01812 Encounter for preprocedural laboratory examination: Secondary | ICD-10-CM | POA: Insufficient documentation

## 2011-10-28 DIAGNOSIS — Z01818 Encounter for other preprocedural examination: Secondary | ICD-10-CM | POA: Insufficient documentation

## 2011-10-28 HISTORY — DX: Sleep apnea, unspecified: G47.30

## 2011-10-28 HISTORY — DX: Hypothyroidism, unspecified: E03.9

## 2011-10-28 LAB — DIFFERENTIAL
Lymphocytes Relative: 28 % (ref 12–46)
Lymphs Abs: 1.8 10*3/uL (ref 0.7–4.0)
Monocytes Absolute: 0.4 10*3/uL (ref 0.1–1.0)
Monocytes Relative: 6 % (ref 3–12)
Neutro Abs: 4 10*3/uL (ref 1.7–7.7)
Neutrophils Relative %: 62 % (ref 43–77)

## 2011-10-28 LAB — CBC
HCT: 46.1 % (ref 39.0–52.0)
Hemoglobin: 15.1 g/dL (ref 13.0–17.0)
MCHC: 32.8 g/dL (ref 30.0–36.0)
RBC: 5.12 MIL/uL (ref 4.22–5.81)

## 2011-10-28 LAB — URINALYSIS, ROUTINE W REFLEX MICROSCOPIC
Glucose, UA: 100 mg/dL — AB
Ketones, ur: NEGATIVE mg/dL
Leukocytes, UA: NEGATIVE
Nitrite: NEGATIVE
Specific Gravity, Urine: 1.016 (ref 1.005–1.030)
pH: 6 (ref 5.0–8.0)

## 2011-10-28 LAB — SURGICAL PCR SCREEN
MRSA, PCR: NEGATIVE
Staphylococcus aureus: NEGATIVE

## 2011-10-28 LAB — BASIC METABOLIC PANEL
BUN: 24 mg/dL — ABNORMAL HIGH (ref 6–23)
Chloride: 100 mEq/L (ref 96–112)
GFR calc Af Amer: 30 mL/min — ABNORMAL LOW (ref >90)
GFR calc non Af Amer: 26 mL/min — ABNORMAL LOW (ref >90)
Potassium: 3.1 mEq/L — ABNORMAL LOW (ref 3.5–5.1)
Sodium: 138 mEq/L (ref 135–145)

## 2011-10-28 LAB — URINE MICROSCOPIC-ADD ON

## 2011-10-28 LAB — PROTIME-INR: INR: 1.07 (ref 0.00–1.49)

## 2011-10-28 MED ORDER — CHLORHEXIDINE GLUCONATE 4 % EX LIQD
60.0000 mL | Freq: Once | CUTANEOUS | Status: DC
  Filled 2011-10-28: qty 60

## 2011-10-28 MED ORDER — CEFAZOLIN SODIUM 1-5 GM-% IV SOLN: 1.0000 g | INTRAVENOUS | Status: DC

## 2011-10-28 NOTE — Patient Instructions (Signed)
[place]  10/28/2011   Your procedure is scheduled on:  11/10/11 1030am-1140am  Report to [place] at 0800 AM.  Call this number if you have problems the morning of surgery: [phone]   Remember:   Do not eat food:After Midnight.  May have clear liquids:until Midnight .  Marland Kitchen  Take these medicines the morning of surgery with A SIP OF WATER:    Do not wear jewelry,     Do not bring valuables to the hospital.  Contacts, dentures or bridgework may not be worn into surgery.  Leave suitcase in the car. After surgery it may be brought to your room.  For patients admitted to the hospital, checkout time is 11:00 AM the day of discharge.    Special Instructions: CHG Shower Use Special Wash: 1/2 bottle night before surgery and 1/2 bottle morning of surgery. shower chin to toes with CHG.  Wash face and private parts with regular soap.    Please read over the following fact sheets that you were given: MRSA Information, Blood Transfusion Fact Sheet, coughing and deep breathing exercises, leg exercises, Incentive Spirometry Fact sheet

## 2011-10-28 NOTE — Pre-Procedure Instructions (Signed)
10/28/11 Adrian Prince, PA made aware of abnormal labs on preop visit of today.

## 2011-10-28 NOTE — Pre-Procedure Instructions (Signed)
10/28/11 Called [facility] in [place] to find out mg of Verapamil pt takes since pt was unsure. Drug Store personnel verified dosage as 180mg  and placed in computer.

## 2011-10-28 NOTE — Progress Notes (Signed)
10/28/11 1145  OBSTRUCTIVE SLEEP APNEA  Have you ever been diagnosed with sleep apnea through a sleep study? No  Do you snore loudly (loud enough to be heard through closed doors)?  1  Do you often feel tired, fatigued, or sleepy during the daytime? 1  Has anyone observed you stop breathing during your sleep? 1  Do you have, or are you being treated for high blood pressure? 1  BMI more than 35 kg/m2? 1  Age over 65 years old? 1  Neck circumference greater than 40 cm/18 inches? 1  Gender: 1  Obstructive Sleep Apnea Score 8   Score 4 or greater  Updated health history

## 2011-11-04 NOTE — Pre-Procedure Instructions (Signed)
4.3/13/Pt had sleep study done on 10/30/11.  Only raw data available sleep study specialist on vacation week of 11/02/11 Will not be back in office until 11/10/11.  [place] to send raw data.  Phone nunmber for [facility] is [phone].

## 2011-11-04 NOTE — Pre-Procedure Instructions (Signed)
11/04/11 Requested and received raw data for sleep study done on 10/30/11.  Placed on chart behind cardiology section.

## 2011-11-06 NOTE — H&P (Signed)
[name] [name] is an 66 y.o. male.    Chief Complaint: right knee OA and pain   HPI: Pt is a 66 y.o. male complaining of right knee pain for 4-5 years. Pain had continually increased since the beginning. X-rays in the clinic show end-stage arthritic changes of the right knee. Pt has tried various conservative treatments which have failed to alleviate their symptoms including steroid injections which didn't help at all. Various options are discussed with the patient. Risks, benefits and expectations were discussed with the patient. Patient understand the risks, benefits and expectations and wishes to proceed with surgery.   PCP:  Lynne Logan, MD, MD  D/C Plans:  Home with HHPT  Post-op Meds:   Rx given for ASA, Robaxin, Iron, Colace and MiraLax  Tranexamic Acid:   To be given  Decadron:   Not to be given  PMH: Past Medical History  Diagnosis Date  . Peripheral vascular disease     neuropathy in toes   . Shortness of breath     due ot pain in knees   . Sleep apnea     sleep study 3/29 13 ? location   . Pneumonia     hx of   . Diabetes mellitus   . Hypothyroidism   . Chronic kidney disease     stage III kidney disease   . Arthritis     knees, back     PSH: Past Surgical History  Procedure Date  . Cholecystectomy     Social History:  reports that he quit smoking about 4 years ago. He has never used smokeless tobacco. He reports that he does not drink alcohol or use illicit drugs.  Allergies:  No Known Allergies  Medications: Current Facility-Administered Medications  Medication Dose Route Frequency Provider Last Rate Last Dose  . ceFAZolin (ANCEF) IVPB 1 g/50 mL premix  1 g Intravenous 60 min Pre-Op Pricilla Loveless, PA       Current Outpatient Prescriptions  Medication Sig Dispense Refill  . aspirin 81 MG chewable tablet Chew 81 mg by mouth every morning.      . furosemide (LASIX) 80 MG tablet Take 80 mg by mouth every morning.      . glyBURIDE (DIABETA) 5  MG tablet Take 5 mg by mouth daily with breakfast.      . insulin glargine (LANTUS) 100 UNIT/ML injection Inject 51 Units into the skin at bedtime.      Marland Kitchen levothyroxine (SYNTHROID, LEVOTHROID) 300 MCG tablet Take 300 mcg by mouth every morning.      Marland Kitchen losartan (COZAAR) 100 MG tablet Take 100 mg by mouth every morning.      . rosuvastatin (CRESTOR) 5 MG tablet Take 5 mg by mouth every morning.      . testosterone cypionate (DEPOTESTOTERONE CYPIONATE) 100 MG/ML injection Inject into the muscle every 28 (twenty-eight) days. For IM use only      . verapamil (CALAN) 80 MG tablet Take 180 mg by mouth every morning.          ROS: Review of Systems  Constitutional: Negative.  Negative for fever, chills and malaise/fatigue.  HENT: Negative.   Eyes: Negative.   Respiratory: Positive for shortness of breath.   Cardiovascular: Negative.   Gastrointestinal: Negative.   Genitourinary: Positive for urgency.  Musculoskeletal: Positive for joint pain.  Skin: Negative.   Neurological: Negative.   Endo/Heme/Allergies: Negative.   Psychiatric/Behavioral: Negative.      Physical Exam: BP: 175/85 ; HR: 65 ; Resp:  18 ;  Physical Exam  Constitutional: He is oriented to person, place, and time and well-developed, well-nourished, and in no distress.  HENT:  Head: Normocephalic and atraumatic.  Nose: Nose normal.  Mouth/Throat: Oropharynx is clear and moist.  Eyes: Pupils are equal, round, and reactive to light.  Neck: Neck supple. No JVD present. No tracheal deviation present. No thyromegaly present.  Cardiovascular: Normal rate, regular rhythm, normal heart sounds and intact distal pulses.   Pulmonary/Chest: Effort normal and breath sounds normal. No stridor.  Abdominal: Soft. There is no tenderness. There is no guarding.  Musculoskeletal:       Right knee: He exhibits decreased range of motion, swelling and bony tenderness. He exhibits no effusion, no ecchymosis and no deformity. tenderness found.    Lymphadenopathy:    He has no cervical adenopathy.  Neurological: He is alert and oriented to person, place, and time.  Skin: Skin is warm and dry.  Psychiatric: Affect normal.     Assessment/Plan Assessment: right knee OA and pain   Plan: Patient will undergo a right total knee arthroplasty on 11/10/2011 per Dr. Alvan Dame at [facility]. Risks benefits and expectation were discussed with the patient. Patient understand risks, benefits and expectation and wishes to proceed.   [place] [name] [name]   PAC  11/06/2011, 9:28 PM

## 2011-11-10 ENCOUNTER — Other Ambulatory Visit: Payer: Self-pay

## 2011-11-10 ENCOUNTER — Encounter (HOSPITAL_COMMUNITY): Payer: Self-pay | Admitting: *Deleted

## 2011-11-10 ENCOUNTER — Encounter (HOSPITAL_COMMUNITY): Payer: Self-pay | Admitting: Anesthesiology

## 2011-11-10 ENCOUNTER — Ambulatory Visit (HOSPITAL_COMMUNITY): Payer: Medicare Other | Admitting: Anesthesiology

## 2011-11-10 ENCOUNTER — Inpatient Hospital Stay (HOSPITAL_COMMUNITY)
Admission: RE | Admit: 2011-11-10 | Discharge: 2011-11-13 | DRG: 470 | Disposition: A | Discharge status: Skilled Nursing Facility | Payer: Medicare Other | Source: Ambulatory Visit | Attending: Orthopedic Surgery | Admitting: Orthopedic Surgery

## 2011-11-10 ENCOUNTER — Encounter (HOSPITAL_COMMUNITY)
Admission: RE | Disposition: A | Discharge status: Skilled Nursing Facility | Payer: Self-pay | Source: Ambulatory Visit | Attending: Orthopedic Surgery

## 2011-11-10 DIAGNOSIS — G4730 Sleep apnea, unspecified: Secondary | ICD-10-CM | POA: Diagnosis present

## 2011-11-10 DIAGNOSIS — N183 Chronic kidney disease, stage 3 unspecified: Secondary | ICD-10-CM | POA: Diagnosis present

## 2011-11-10 DIAGNOSIS — M1710 Unilateral primary osteoarthritis, unspecified knee: Principal | ICD-10-CM | POA: Diagnosis present

## 2011-11-10 DIAGNOSIS — Z96652 Presence of left artificial knee joint: Secondary | ICD-10-CM

## 2011-11-10 DIAGNOSIS — Z6841 Body Mass Index (BMI) 40.0 and over, adult: Secondary | ICD-10-CM

## 2011-11-10 DIAGNOSIS — Z96659 Presence of unspecified artificial knee joint: Secondary | ICD-10-CM

## 2011-11-10 DIAGNOSIS — I739 Peripheral vascular disease, unspecified: Secondary | ICD-10-CM | POA: Diagnosis present

## 2011-11-10 DIAGNOSIS — E039 Hypothyroidism, unspecified: Secondary | ICD-10-CM | POA: Diagnosis present

## 2011-11-10 DIAGNOSIS — G5790 Unspecified mononeuropathy of unspecified lower limb: Secondary | ICD-10-CM | POA: Diagnosis present

## 2011-11-10 DIAGNOSIS — E119 Type 2 diabetes mellitus without complications: Secondary | ICD-10-CM | POA: Diagnosis present

## 2011-11-10 HISTORY — PX: TOTAL KNEE ARTHROPLASTY: SHX125

## 2011-11-10 LAB — GLUCOSE, CAPILLARY
Glucose-Capillary: 84 mg/dL (ref 70–99)
Glucose-Capillary: 72 mg/dL (ref 70–99)

## 2011-11-10 LAB — ABO/RH: ABO/RH(D): O POS

## 2011-11-10 SURGERY — ARTHROPLASTY, KNEE, TOTAL
Anesthesia: Spinal | Site: Knee | Laterality: Right | Wound class: Clean

## 2011-11-10 MED ORDER — GLYBURIDE 5 MG PO TABS
5.0000 mg | ORAL_TABLET | Freq: Every day | ORAL | Status: DC
  Administered 2011-11-11 – 2011-11-13 (×3): 5 mg via ORAL
  Filled 2011-11-10 (×4): qty 1

## 2011-11-10 MED ORDER — ONDANSETRON HCL 4 MG PO TABS
4.0000 mg | ORAL_TABLET | Freq: Four times a day (QID) | ORAL | Status: DC | PRN
  Administered 2011-11-12: 4 mg via ORAL
  Filled 2011-11-10: qty 1

## 2011-11-10 MED ORDER — CEFAZOLIN SODIUM 1-5 GM-% IV SOLN
1.0000 g | Freq: Four times a day (QID) | INTRAVENOUS | Status: AC
  Administered 2011-11-10 – 2011-11-11 (×3): 1 g via INTRAVENOUS
  Filled 2011-11-10 (×4): qty 50

## 2011-11-10 MED ORDER — TRANEXAMIC ACID 100 MG/ML IV SOLN
2440.0000 mg | Freq: Once | INTRAVENOUS | Status: AC
  Administered 2011-11-10: 2440 mg via INTRAVENOUS
  Filled 2011-11-10: qty 24.4

## 2011-11-10 MED ORDER — FERROUS SULFATE 325 (65 FE) MG PO TABS
325.0000 mg | ORAL_TABLET | Freq: Three times a day (TID) | ORAL | Status: DC
  Administered 2011-11-11 – 2011-11-13 (×8): 325 mg via ORAL
  Filled 2011-11-10 (×9): qty 1

## 2011-11-10 MED ORDER — PROPOFOL 10 MG/ML IV EMUL
INTRAVENOUS | Status: DC | PRN
  Administered 2011-11-10: 50 ug/kg/min via INTRAVENOUS

## 2011-11-10 MED ORDER — LOSARTAN POTASSIUM 50 MG PO TABS
100.0000 mg | ORAL_TABLET | Freq: Every day | ORAL | Status: DC
Start: 2011-11-11 — End: 2011-11-13
  Administered 2011-11-11 – 2011-11-13 (×3): 100 mg via ORAL
  Filled 2011-11-10 (×4): qty 2

## 2011-11-10 MED ORDER — SENNA 8.6 MG PO TABS
1.0000 | ORAL_TABLET | Freq: Two times a day (BID) | ORAL | Status: DC
  Administered 2011-11-11 – 2011-11-13 (×5): 8.6 mg via ORAL
  Filled 2011-11-10 (×6): qty 1

## 2011-11-10 MED ORDER — PHENYLEPHRINE HCL 10 MG/ML IJ SOLN
10.0000 mg | INTRAVENOUS | Status: DC | PRN
  Administered 2011-11-10: 10 ug/min via INTRAVENOUS

## 2011-11-10 MED ORDER — ZOLPIDEM TARTRATE 5 MG PO TABS: 5.0000 mg | ORAL_TABLET | Freq: Every evening | ORAL | Status: DC | PRN

## 2011-11-10 MED ORDER — BUPIVACAINE-EPINEPHRINE 0.25% -1:200000 IJ SOLN
INTRAMUSCULAR | Status: AC
  Filled 2011-11-10: qty 1

## 2011-11-10 MED ORDER — HYDROMORPHONE HCL PF 1 MG/ML IJ SOLN
0.2000 mg | INTRAMUSCULAR | Status: DC | PRN
  Administered 2011-11-10: 0.6 mg via INTRAVENOUS
  Administered 2011-11-10: 0.5 mg via INTRAVENOUS
  Administered 2011-11-11: 0.4 mg via INTRAVENOUS
  Filled 2011-11-10 (×3): qty 1

## 2011-11-10 MED ORDER — FENTANYL CITRATE 0.05 MG/ML IJ SOLN
INTRAMUSCULAR | Status: DC | PRN
  Administered 2011-11-10: 50 ug via INTRAVENOUS

## 2011-11-10 MED ORDER — KETOROLAC TROMETHAMINE 30 MG/ML IJ SOLN
INTRAMUSCULAR | Status: AC
  Filled 2011-11-10: qty 1

## 2011-11-10 MED ORDER — SODIUM CHLORIDE 0.9 % IV SOLN
INTRAVENOUS | Status: DC
  Administered 2011-11-10 – 2011-11-11 (×2): via INTRAVENOUS
  Filled 2011-11-10 (×11): qty 1000

## 2011-11-10 MED ORDER — ACETAMINOPHEN 10 MG/ML IV SOLN
INTRAVENOUS | Status: DC | PRN
  Administered 2011-11-10: 1000 mg via INTRAVENOUS

## 2011-11-10 MED ORDER — FUROSEMIDE 80 MG PO TABS
80.0000 mg | ORAL_TABLET | Freq: Every day | ORAL | Status: DC
  Administered 2011-11-11 – 2011-11-13 (×3): 80 mg via ORAL
  Filled 2011-11-10 (×3): qty 1

## 2011-11-10 MED ORDER — BUPIVACAINE IN DEXTROSE 0.75-8.25 % IT SOLN
INTRATHECAL | Status: DC | PRN
  Administered 2011-11-10: 2 mL via INTRATHECAL

## 2011-11-10 MED ORDER — INSULIN GLARGINE 100 UNIT/ML ~~LOC~~ SOLN
51.0000 [IU] | Freq: Every day | SUBCUTANEOUS | Status: DC
  Administered 2011-11-10 – 2011-11-12 (×3): 51 [IU] via SUBCUTANEOUS

## 2011-11-10 MED ORDER — DOCUSATE SODIUM 100 MG PO CAPS
100.0000 mg | ORAL_CAPSULE | Freq: Two times a day (BID) | ORAL | Status: DC
  Administered 2011-11-10 – 2011-11-13 (×6): 100 mg via ORAL
  Filled 2011-11-10 (×7): qty 1

## 2011-11-10 MED ORDER — CHLORHEXIDINE GLUCONATE 4 % EX LIQD
60.0000 mL | Freq: Once | CUTANEOUS | Status: DC
  Filled 2011-11-10: qty 60

## 2011-11-10 MED ORDER — ACETAMINOPHEN 10 MG/ML IV SOLN
INTRAVENOUS | Status: AC
  Filled 2011-11-10: qty 100

## 2011-11-10 MED ORDER — VERAPAMIL HCL 120 MG PO TABS
180.0000 mg | ORAL_TABLET | Freq: Every day | ORAL | Status: DC
  Administered 2011-11-11 – 2011-11-13 (×3): 180 mg via ORAL
  Filled 2011-11-10 (×4): qty 1.5

## 2011-11-10 MED ORDER — INSULIN ASPART 100 UNIT/ML ~~LOC~~ SOLN
0.0000 [IU] | Freq: Three times a day (TID) | SUBCUTANEOUS | Status: DC
  Administered 2011-11-11 – 2011-11-12 (×4): 2 [IU] via SUBCUTANEOUS

## 2011-11-10 MED ORDER — SODIUM CHLORIDE 0.9 % IV SOLN
INTRAVENOUS | Status: DC | PRN
  Administered 2011-11-10 (×2): via INTRAVENOUS

## 2011-11-10 MED ORDER — CEFAZOLIN SODIUM-DEXTROSE 2-3 GM-% IV SOLR
2.0000 g | Freq: Once | INTRAVENOUS | Status: AC
  Administered 2011-11-10: 2 g via INTRAVENOUS

## 2011-11-10 MED ORDER — RIVAROXABAN 10 MG PO TABS
10.0000 mg | ORAL_TABLET | Freq: Every day | ORAL | Status: DC
  Administered 2011-11-11 – 2011-11-13 (×3): 10 mg via ORAL
  Filled 2011-11-10 (×3): qty 1

## 2011-11-10 MED ORDER — ATORVASTATIN CALCIUM 10 MG PO TABS
10.0000 mg | ORAL_TABLET | Freq: Every day | ORAL | Status: DC
  Administered 2011-11-10 – 2011-11-12 (×3): 10 mg via ORAL
  Filled 2011-11-10 (×4): qty 1

## 2011-11-10 MED ORDER — MIDAZOLAM HCL 5 MG/5ML IJ SOLN
INTRAMUSCULAR | Status: DC | PRN
  Administered 2011-11-10: 2 mg via INTRAVENOUS

## 2011-11-10 MED ORDER — ONDANSETRON HCL 4 MG/2ML IJ SOLN
INTRAMUSCULAR | Status: DC | PRN
  Administered 2011-11-10: 4 mg via INTRAVENOUS

## 2011-11-10 MED ORDER — KETAMINE HCL 10 MG/ML IJ SOLN
INTRAMUSCULAR | Status: DC | PRN
  Administered 2011-11-10 (×4): 10 mg via INTRAVENOUS

## 2011-11-10 MED ORDER — SODIUM CHLORIDE 0.9 % IR SOLN
Status: DC | PRN
  Administered 2011-11-10: 3000 mL

## 2011-11-10 MED ORDER — PROMETHAZINE HCL 25 MG/ML IJ SOLN: 6.2500 mg | INTRAMUSCULAR | Status: DC | PRN

## 2011-11-10 MED ORDER — LEVOTHYROXINE SODIUM 150 MCG PO TABS
300.0000 ug | ORAL_TABLET | Freq: Every day | ORAL | Status: DC
  Administered 2011-11-11 – 2011-11-13 (×3): 300 ug via ORAL
  Filled 2011-11-10 (×3): qty 2

## 2011-11-10 MED ORDER — ONDANSETRON HCL 4 MG/2ML IJ SOLN
4.0000 mg | Freq: Four times a day (QID) | INTRAMUSCULAR | Status: DC | PRN
  Administered 2011-11-12: 4 mg via INTRAVENOUS
  Filled 2011-11-10: qty 2

## 2011-11-10 MED ORDER — DIPHENHYDRAMINE HCL 12.5 MG/5ML PO ELIX: 25.0000 mg | ORAL_SOLUTION | Freq: Four times a day (QID) | ORAL | Status: DC | PRN

## 2011-11-10 MED ORDER — BUPIVACAINE-EPINEPHRINE PF 0.25-1:200000 % IJ SOLN
INTRAMUSCULAR | Status: DC | PRN
  Administered 2011-11-10: 50 mL

## 2011-11-10 MED ORDER — KETOROLAC TROMETHAMINE 30 MG/ML IJ SOLN
INTRAMUSCULAR | Status: DC | PRN
  Administered 2011-11-10: 30 mg

## 2011-11-10 MED ORDER — LACTATED RINGERS IV SOLN: INTRAVENOUS | Status: DC

## 2011-11-10 MED ORDER — PHENOL 1.4 % MT LIQD
1.0000 | OROMUCOSAL | Status: DC | PRN
  Filled 2011-11-10: qty 177

## 2011-11-10 MED ORDER — CEFAZOLIN SODIUM-DEXTROSE 2-3 GM-% IV SOLR
INTRAVENOUS | Status: AC
  Filled 2011-11-10: qty 50

## 2011-11-10 MED ORDER — MENTHOL 3 MG MT LOZG
1.0000 | LOZENGE | OROMUCOSAL | Status: DC | PRN
  Filled 2011-11-10: qty 9

## 2011-11-10 MED ORDER — HYDROCODONE-ACETAMINOPHEN 7.5-325 MG PO TABS
1.0000 | ORAL_TABLET | ORAL | Status: DC | PRN
  Administered 2011-11-10 (×3): 1 via ORAL
  Administered 2011-11-11 (×2): 2 via ORAL
  Filled 2011-11-10 (×3): qty 1
  Filled 2011-11-10 (×2): qty 2

## 2011-11-10 MED ORDER — POLYETHYLENE GLYCOL 3350 17 G PO PACK
17.0000 g | PACK | Freq: Every day | ORAL | Status: DC | PRN
  Filled 2011-11-10: qty 1

## 2011-11-10 MED ORDER — ALUMINUM HYDROXIDE GEL 320 MG/5ML PO SUSP
15.0000 mL | ORAL | Status: DC | PRN
  Filled 2011-11-10: qty 30

## 2011-11-10 MED ORDER — 0.9 % SODIUM CHLORIDE (POUR BTL) OPTIME
TOPICAL | Status: DC | PRN
  Administered 2011-11-10: 1000 mL

## 2011-11-10 MED ORDER — FENTANYL CITRATE 0.05 MG/ML IJ SOLN: 25.0000 ug | INTRAMUSCULAR | Status: DC | PRN

## 2011-11-10 MED ORDER — METHOCARBAMOL 100 MG/ML IJ SOLN
500.0000 mg | Freq: Four times a day (QID) | INTRAVENOUS | Status: DC | PRN
  Administered 2011-11-10 – 2011-11-11 (×2): 500 mg via INTRAVENOUS
  Filled 2011-11-10 (×2): qty 5

## 2011-11-10 MED ORDER — METHOCARBAMOL 500 MG PO TABS
500.0000 mg | ORAL_TABLET | Freq: Four times a day (QID) | ORAL | Status: DC | PRN
  Administered 2011-11-11 – 2011-11-12 (×2): 500 mg via ORAL
  Filled 2011-11-10 (×3): qty 1

## 2011-11-10 SURGICAL SUPPLY — 57 items
BAG ZIPLOCK 12X15 (MISCELLANEOUS) ×2 IMPLANT
BANDAGE ELASTIC 6 VELCRO ST LF (GAUZE/BANDAGES/DRESSINGS) ×2 IMPLANT
BANDAGE ESMARK 6X9 LF (GAUZE/BANDAGES/DRESSINGS) ×1 IMPLANT
BLADE SAW SGTL 13.0X1.19X90.0M (BLADE) ×2 IMPLANT
BNDG ESMARK 6X9 LF (GAUZE/BANDAGES/DRESSINGS) ×2
BONE CEMENT GENTAMICIN (Cement) ×4 IMPLANT
BOWL SMART MIX CTS (DISPOSABLE) ×2 IMPLANT
CEMENT BONE GENTAMICIN 40 (Cement) ×2 IMPLANT
CLOTH BEACON ORANGE TIMEOUT ST (SAFETY) ×2 IMPLANT
CUFF TOURN SGL QUICK 34 (TOURNIQUET CUFF) ×1
CUFF TRNQT CYL 34X4X40X1 (TOURNIQUET CUFF) ×1 IMPLANT
DECANTER SPIKE VIAL GLASS SM (MISCELLANEOUS) ×4 IMPLANT
DERMABOND ADVANCED (GAUZE/BANDAGES/DRESSINGS) ×1
DERMABOND ADVANCED .7 DNX12 (GAUZE/BANDAGES/DRESSINGS) ×1 IMPLANT
DRAPE EXTREMITY T 121X128X90 (DRAPE) ×2 IMPLANT
DRAPE POUCH INSTRU U-SHP 10X18 (DRAPES) ×2 IMPLANT
DRAPE U-SHAPE 47X51 STRL (DRAPES) ×2 IMPLANT
DRSG AQUACEL AG ADV 3.5X10 (GAUZE/BANDAGES/DRESSINGS) ×2 IMPLANT
DRSG TEGADERM 4X4.75 (GAUZE/BANDAGES/DRESSINGS) ×2 IMPLANT
DURAPREP 26ML APPLICATOR (WOUND CARE) ×2 IMPLANT
ELECT REM PT RETURN 9FT ADLT (ELECTROSURGICAL) ×2
ELECTRODE REM PT RTRN 9FT ADLT (ELECTROSURGICAL) ×1 IMPLANT
EVACUATOR 1/8 PVC DRAIN (DRAIN) ×2 IMPLANT
FACESHIELD LNG OPTICON STERILE (SAFETY) ×10 IMPLANT
GAUZE SPONGE 2X2 8PLY STRL LF (GAUZE/BANDAGES/DRESSINGS) ×1 IMPLANT
GLOVE BIOGEL PI IND STRL 7.5 (GLOVE) ×1 IMPLANT
GLOVE BIOGEL PI IND STRL 8 (GLOVE) ×1 IMPLANT
GLOVE BIOGEL PI INDICATOR 7.5 (GLOVE) ×1
GLOVE BIOGEL PI INDICATOR 8 (GLOVE) ×1
GLOVE ECLIPSE 8.0 STRL XLNG CF (GLOVE) ×2 IMPLANT
GLOVE ORTHO TXT STRL SZ7.5 (GLOVE) ×4 IMPLANT
GOWN BRE IMP PREV XXLGXLNG (GOWN DISPOSABLE) ×4 IMPLANT
GOWN STRL NON-REIN LRG LVL3 (GOWN DISPOSABLE) ×4 IMPLANT
HANDPIECE INTERPULSE COAX TIP (DISPOSABLE) ×1
IMMOBILIZER KNEE 20 (SOFTGOODS)
IMMOBILIZER KNEE 20 THIGH 36 (SOFTGOODS) IMPLANT
IMMOBILIZER KNEE 22 UNIV (SOFTGOODS) ×2 IMPLANT
KIT BASIN OR (CUSTOM PROCEDURE TRAY) ×2 IMPLANT
MANIFOLD NEPTUNE II (INSTRUMENTS) ×2 IMPLANT
NDL SAFETY ECLIPSE 18X1.5 (NEEDLE) ×1 IMPLANT
NEEDLE HYPO 18GX1.5 SHARP (NEEDLE) ×1
NS IRRIG 1000ML POUR BTL (IV SOLUTION) ×4 IMPLANT
PACK TOTAL JOINT (CUSTOM PROCEDURE TRAY) ×2 IMPLANT
POSITIONER SURGICAL ARM (MISCELLANEOUS) ×2 IMPLANT
SET HNDPC FAN SPRY TIP SCT (DISPOSABLE) ×1 IMPLANT
SET PAD KNEE POSITIONER (MISCELLANEOUS) ×2 IMPLANT
SPONGE GAUZE 2X2 STER 10/PKG (GAUZE/BANDAGES/DRESSINGS) ×1
SUCTION FRAZIER 12FR DISP (SUCTIONS) ×2 IMPLANT
SUT MNCRL AB 4-0 PS2 18 (SUTURE) ×2 IMPLANT
SUT VIC AB 1 CT1 36 (SUTURE) ×6 IMPLANT
SUT VIC AB 2-0 CT1 27 (SUTURE) ×3
SUT VIC AB 2-0 CT1 TAPERPNT 27 (SUTURE) ×3 IMPLANT
SYR 50ML LL SCALE MARK (SYRINGE) ×2 IMPLANT
TOWEL OR 17X26 10 PK STRL BLUE (TOWEL DISPOSABLE) ×4 IMPLANT
TRAY FOLEY CATH 14FRSI W/METER (CATHETERS) ×2 IMPLANT
WATER STERILE IRR 1500ML POUR (IV SOLUTION) ×2 IMPLANT
WRAP KNEE MAXI GEL POST OP (GAUZE/BANDAGES/DRESSINGS) ×2 IMPLANT

## 2011-11-10 NOTE — Anesthesia Preprocedure Evaluation (Addendum)
Anesthesia Evaluation  Patient identified by MRN, date of birth, ID band Patient awake    Reviewed: Allergy & Precautions, H&P , NPO status , Patient's Chart, lab work & pertinent test results  History of Anesthesia Complications Negative for: history of anesthetic complications  Airway Mallampati: II TM Distance: >3 FB Neck ROM: Full    Dental  (+) Edentulous Upper and Dental Advisory Given   Pulmonary neg pulmonary ROS, shortness of breath, sleep apnea , pneumonia ,  breath sounds clear to auscultation  Pulmonary exam normal       Cardiovascular + Peripheral Vascular Disease negative cardio ROS  Rhythm:Regular Rate:Normal     Neuro/Psych negative neurological ROS  negative psych ROS   GI/Hepatic negative GI ROS, Neg liver ROS,   Endo/Other  negative endocrine ROSDiabetes mellitus-, Type 2, Insulin Dependent and Oral Hypoglycemic AgentsHypothyroidism Morbid obesity  Renal/GU Renal InsufficiencyRenal diseasenegative Renal ROS  negative genitourinary   Musculoskeletal negative musculoskeletal ROS (+)   Abdominal   Peds  Hematology negative hematology ROS (+)   Anesthesia Other Findings   Reproductive/Obstetrics negative OB ROS                         Anesthesia Physical Anesthesia Plan  ASA: III  Anesthesia Plan: Spinal   Post-op Pain Management:    Induction:   Airway Management Planned: Simple Face Mask  Additional Equipment:   Intra-op Plan:   Post-operative Plan:   Informed Consent: I have reviewed the patients History and Physical, chart, labs and discussed the procedure including the risks, benefits and alternatives for the proposed anesthesia with the patient or authorized representative who has indicated his/her understanding and acceptance.   Dental advisory given  Plan Discussed with: CRNA  Anesthesia Plan Comments:         Anesthesia Quick Evaluation

## 2011-11-10 NOTE — Anesthesia Postprocedure Evaluation (Signed)
  Anesthesia Post-op Note  Patient: [name] [name]  Procedure(s) Performed: Procedure(s) (LRB): TOTAL KNEE ARTHROPLASTY (Right)  Patient Location: PACU  Anesthesia Type: Spinal  Level of Consciousness: awake and alert   Airway and Oxygen Therapy: Patient Spontanous Breathing  Post-op Pain: mild  Post-op Assessment: Post-op Vital signs reviewed, Patient's Cardiovascular Status Stable, Respiratory Function Stable, Patent Airway and No signs of Nausea or vomiting  Post-op Vital Signs: stable  Complications: No apparent anesthesia complications

## 2011-11-10 NOTE — Preoperative (Signed)
Beta Blockers   Reason not to administer Beta Blockers:Not Applicable 

## 2011-11-10 NOTE — Progress Notes (Signed)
Called to room because pt feeling cold and clammy and states something is not right.  Iv Dilaudid 0.6mg  given about 30 minutes ago and 1 norco also.  Vss, 133/71, 44, 97.4 orally, 98% on 2L.  Blood sugar is 106.  Pt denies chest pain.  Will continue to monitor.

## 2011-11-10 NOTE — Progress Notes (Signed)
Patient's heart rate is brady.  Patient is alert and oriented and reports no symptoms other than right knee pain.  Dr. Doran Durand paged, order received to perform EKG.  EKG performed, called Dr. Doran Durand to report the machine's interpretation.  Will continue to monitor patient, he is on continuous pulse oximetry.  Dr. Merlinda Frederick said he would consult cardiology.

## 2011-11-10 NOTE — Op Note (Signed)
NAME:  [name] [name]                      MEDICAL RECORD NO.:  [id number]                             FACILITY:  [facility]      PHYSICIAN:  Pietro Cassis. Alvan Dame, M.D.  DATE OF BIRTH:  1945/10/21      DATE OF PROCEDURE:  11/10/2011                                     OPERATIVE REPORT         PREOPERATIVE DIAGNOSIS:  Right knee osteoarthritis.      POSTOPERATIVE DIAGNOSIS:  Right knee osteoarthritis. 2. Morbid obesity, 3. Diabetes     FINDINGS:  The patient was noted to have complete loss of cartilage and   bone-on-bone arthritis with associated osteophytes in the medial, lateral and patellofemoral compartments of   the knee with a significant synovitis and associated effusion.      PROCEDURE:  Right total knee replacement.      COMPONENTS USED:  DePuy rotating platform posterior stabilized knee   system, a size 4 femur, 4 MBT tibia, 12.5 PS mm insert, and 41 patellar   button, with Gentamycin cement      SURGEON:  Pietro Cassis. Alvan Dame, M.D.      ASSISTANT:  Molli Barrows, PA-C.      ANESTHESIA:  Spinal.      SPECIMENS:  None.      COMPLICATION:  None.      DRAINS:  One Hemovac.  EBL: about 150cc      TOURNIQUET TIME:   Total Tourniquet Time Documented: Thigh (Right) - 46 minutes .      The patient was stable to the recovery room.      INDICATION FOR PROCEDURE:  [name] [name] is a 66 y.o. male patient of   mine.  The patient had been seen, evaluated, and treated conservatively in the   office with medication, activity modification, and injections.  The patient had   radiographic changes of bone-on-bone arthritis with endplate sclerosis and osteophytes noted.      The patient failed conservative measures including medication, injections, and activity modification, and at this point was ready for more definitive measures.   Based on the radiographic changes and failed conservative measures, the patient   decided to proceed with total knee replacement.  Risks of infection,   DVT,  component failure, need for revision surgery, postop course, and   expectations were all   discussed and reviewed.  Consent was obtained for benefit of pain   relief.      PROCEDURE IN DETAIL:  The patient was brought to the operative theater.   Once adequate anesthesia, preoperative antibiotics, 2 gm of Ancef administered, the patient was positioned supine with the right thigh tourniquet placed.  The  right lower extremity was prepped and draped in sterile fashion.  A time-   out was performed identifying the patient, planned procedure, and   extremity.      The right lower extremity was placed in the [facility] leg holder.  The leg was   exsanguinated, tourniquet elevated to 250 mmHg.  A midline incision was   made followed by median parapatellar arthrotomy.  Following initial   exposure, attention  was first directed to the patella.  Precut   measurement was noted to be 24 mm.  I resected down to 14 mm and used a   41 patellar button to restore patellar height as well as cover the cut   surface.      The lug holes were drilled and a metal shim was placed to protect the   patella from retractors and saw blades.      At this point, attention was now directed to the femur.  The femoral   canal was opened with a drill, irrigated to try to prevent fat emboli.  An   intramedullary rod was passed at 5 degrees valgus, 11 mm of bone was   resected off the distal femur.  Following this resection, the tibia was   subluxated anteriorly.  Using the extramedullary guide, 10 mm of bone was resected off   the proximal lateral tibia.  We confirmed the gap would be   stable medially and laterally with a 10 mm insert as well as confirmed   the cut was perpendicular in the coronal plane, checking with an alignment rod.      Once this was done, I sized the femur to be a size 4 in the anterior-   posterior dimension, chose a standard component based on medial and   lateral dimension.  The size 4 rotation  block was then pinned in   position anterior referenced using the C-clamp to set rotation.  The   anterior, posterior, and  chamfer cuts were made without difficulty nor   notching making certain that I was along the anterior cortex to help   with flexion gap stability.      The final box cut was made off the lateral aspect of distal femur.      At this point, the tibia was sized to be a size 4, the size 4 tray was   then pinned in position through the medial third of the tubercle,   drilled for a MBT tray, and keel punched.  Trial reduction was now carried with a 4 femur,  4 MBT tibia, a 12.5 mm insert, and the 41 patella botton.  The knee was brought to   extension, full extension with good flexion stability with the patella   tracking through the trochlea without application of pressure.  Given   all these findings, the trial components removed.  Final components were   opened and cement was mixed.  The knee was irrigated with normal saline   solution and pulse lavage.  The synovial lining was   then injected with 0.25% Marcaine with epinephrine and 1 cc of Toradol,   total of 61 cc.      The knee was irrigated.  Final implants were then cemented onto clean and   dried cut surfaces of bone with the knee brought to extension with a 12.5   mm trial insert.      Once the cement had fully cured, the excess cement was removed   throughout the knee.  I confirmed I was satisfied with the range of   motion and stability, and the final 12.5 mm insert was chosen.  It was   placed into the knee.      The tourniquet had been let down at 45 minutes.  No significant   hemostasis required.  The medium Hemovac drain was placed deep.  The   extensor mechanism was then reapproximated using #1 Vicryl with the knee  in flexion.  The   remaining wound was closed with 2-0 Vicryl and running 4-0 Monocryl.   The knee was cleaned, dried, dressed sterilely using Dermabond and   Aquacel dressing.  Drain  site dressed separately.  The patient was then   brought to recovery room in stable condition, tolerating the procedure   well.   Please note that Physician Assistant, Molli Barrows, was present for the entirety of the case, and was utilized for pre-operative positioning, peri-operative retractor management, general facilitation of the procedure.  He was also utilized for primary wound closure at the end of the case.              Pietro Cassis Alvan Dame, M.D.

## 2011-11-10 NOTE — Transfer of Care (Signed)
Immediate Anesthesia Transfer of Care Note  Patient: [name] [name]  Procedure(s) Performed: Procedure(s) (LRB): TOTAL KNEE ARTHROPLASTY (Right)  Patient Location: PACU  Anesthesia Type: Spinal  Level of Consciousness: awake, alert , oriented and patient cooperative  Airway & Oxygen Therapy: Patient Spontanous Breathing and Patient connected to face mask oxygen  Post-op Assessment: Report given to PACU RN and Post -op Vital signs reviewed and stable  Post vital signs: Reviewed and stable  Complications: No apparent anesthesia complications

## 2011-11-10 NOTE — Interval H&P Note (Signed)
History and Physical Interval Note:  11/10/2011 10:14 AM  [name] [name]  has presented today for surgery, with the diagnosis of Right Knee Osteoarthritis  The various methods of treatment have been discussed with the patient and family. After consideration of risks, benefits and other options for treatment, the patient has consented to  Procedure(s) (LRB): RIGHT TOTAL KNEE ARTHROPLASTY (Right) as a surgical intervention .  The patients' history has been reviewed, patient examined, no change in status, stable for surgery.  I have reviewed the patients' chart and labs.  Questions were answered to the patient's satisfaction.     Mauri Pole

## 2011-11-10 NOTE — Anesthesia Procedure Notes (Addendum)
Spinal  Patient location during procedure: OR Start time: 11/10/2011 10:32 AM End time: 11/10/2011 10:38 AM Staffing Performed by: anesthesiologist  Preanesthetic Checklist Completed: patient identified, site marked, surgical consent, pre-op evaluation, timeout performed, IV checked, risks and benefits discussed and monitors and equipment checked Spinal Block Patient position: sitting Prep: Betadine Patient monitoring: heart rate, continuous pulse ox and blood pressure Injection technique: single-shot Needle Needle type: Spinocan  Needle gauge: 22 G Needle length: 9 cm Additional Notes Expiration date of kit checked and confirmed. Patient tolerated procedure well, without complications.    Spinal  Patient location during procedure: OR Start time: 11/10/2011 10:32 AM End time: 11/10/2011 10:38 AM Staffing Anesthesiologist: Freddie Apley F Performed by: anesthesiologist  Preanesthetic Checklist Completed: patient identified, site marked, surgical consent, pre-op evaluation, timeout performed, IV checked, risks and benefits discussed and monitors and equipment checked Spinal Block Patient position: sitting Prep: Betadine Patient monitoring: heart rate, continuous pulse ox and blood pressure Approach: midline Injection technique: single-shot Needle Needle type: Quincke  Needle gauge: 22 G Needle length: 9 cm Assessment Sensory level: T6 Additional Notes Expiration date of kit checked and confirmed. Patient tolerated procedure well, without complications. Negative heme/paresthesia Lot [id number] DOE 01/2013

## 2011-11-10 NOTE — Progress Notes (Signed)
Received follow up call from Dr. Doran Durand regarding patient's heart rate.  Advised to continue monitoring patient on floor, to call with any change in condition, and to limit the amount of narcotics given.  Patient is on continuous pulse oximetry with an alarm set for heart rate less than 40.  Will continue to monitor.

## 2011-11-11 DIAGNOSIS — Z96652 Presence of left artificial knee joint: Secondary | ICD-10-CM

## 2011-11-11 LAB — BASIC METABOLIC PANEL
BUN: 19 mg/dL (ref 6–23)
CO2: 25 mEq/L (ref 19–32)
Calcium: 8.3 mg/dL — ABNORMAL LOW (ref 8.4–10.5)
Chloride: 102 mEq/L (ref 96–112)
Creatinine, Ser: 2.31 mg/dL — ABNORMAL HIGH (ref 0.50–1.35)
Glucose, Bld: 142 mg/dL — ABNORMAL HIGH (ref 70–99)

## 2011-11-11 LAB — CBC
HCT: 40.3 % (ref 39.0–52.0)
Hemoglobin: 13.1 g/dL (ref 13.0–17.0)
MCH: 29.5 pg (ref 26.0–34.0)
MCV: 90.8 fL (ref 78.0–100.0)
Platelets: 140 10*3/uL — ABNORMAL LOW (ref 150–400)
RBC: 4.44 MIL/uL (ref 4.22–5.81)

## 2011-11-11 LAB — GLUCOSE, CAPILLARY
Glucose-Capillary: 107 mg/dL — ABNORMAL HIGH (ref 70–99)
Glucose-Capillary: 127 mg/dL — ABNORMAL HIGH (ref 70–99)
Glucose-Capillary: 109 mg/dL — ABNORMAL HIGH (ref 70–99)

## 2011-11-11 MED ORDER — KETOROLAC TROMETHAMINE 15 MG/ML IJ SOLN
15.0000 mg | Freq: Four times a day (QID) | INTRAMUSCULAR | Status: AC
  Administered 2011-11-11 – 2011-11-13 (×8): 15 mg via INTRAVENOUS
  Filled 2011-11-11 (×8): qty 1

## 2011-11-11 MED ORDER — ACETAMINOPHEN 10 MG/ML IV SOLN
1000.0000 mg | Freq: Four times a day (QID) | INTRAVENOUS | Status: AC
  Administered 2011-11-11 – 2011-11-12 (×4): 1000 mg via INTRAVENOUS
  Filled 2011-11-11 (×4): qty 100

## 2011-11-11 MED ORDER — OXYCODONE HCL 5 MG PO TABS
5.0000 mg | ORAL_TABLET | ORAL | Status: DC
  Administered 2011-11-11: 5 mg via ORAL
  Administered 2011-11-11: 10 mg via ORAL
  Administered 2011-11-11: 5 mg via ORAL
  Administered 2011-11-11 – 2011-11-12 (×2): 15 mg via ORAL
  Administered 2011-11-12 – 2011-11-13 (×3): 10 mg via ORAL
  Filled 2011-11-11: qty 2
  Filled 2011-11-11: qty 1
  Filled 2011-11-11 (×4): qty 2
  Filled 2011-11-11 (×2): qty 3

## 2011-11-11 NOTE — Progress Notes (Signed)
Spoke to Dr. Alvan Dame concerning patients heart rate, blood pressure, and pain relief as he was rounding on the floor.  Will continue to monitor.

## 2011-11-11 NOTE — Progress Notes (Signed)
Subjective: 1 Day Post-Op Procedure(s) (LRB): TOTAL KNEE ARTHROPLASTY (Right)   Patient reports pain as moderate to severe at times. Pain issues all throughout the night and unable to get comfortable.  Objective:   VITALS:   Filed Vitals:   11/11/11 0658  BP: 183/89  Pulse: 52  Temp: 98.1 F (36.7 C)  Resp: 12    Neurovascular intact Dorsiflexion/Plantar flexion intact Incision: dressing C/D/I No cellulitis present Compartment soft  LABS  Basename 11/11/11 0412  HGB 13.1  HCT 40.3  WBC 7.8  PLT 140*     Basename 11/11/11 0412  NA 135  K 3.9  BUN 19  CREATININE 2.31*  GLUCOSE 142*     Assessment/Plan: 1 Day Post-Op Procedure(s) (LRB): TOTAL KNEE ARTHROPLASTY (Right)   HV drain d/c'ed Foley cath d/c'ed Advance diet Up with therapy D/C IV fluids Changed Norco to Oxycodone Added Ofirmev Added Toradol Ordered a CPM machine for hospital use to help with ROM Ordered a CPM for home use after he is d/c'ed as well.  [place] Tiernan Suto   PAC  11/11/2011, 9:18 AM

## 2011-11-11 NOTE — Clinical Documentation Improvement (Signed)
BMI DOCUMENTATION CLARIFICATION QUERY  THIS DOCUMENT IS NOT A PERMANENT PART OF THE MEDICAL RECORD  TO RESPOND TO THE THIS QUERY, FOLLOW THE INSTRUCTIONS BELOW:  1. If needed, update documentation for the patient's encounter via the notes activity.  2. Access this query again and click edit on the In [employer].  3. After updating, or not, click F2 to complete all highlighted (required) fields concerning your review. Select "additional documentation in the medical record" OR "no additional documentation provided".  4. Click Sign note button.  5. The deficiency will fall out of your In Basket *Please let us know if you are not able to complete this workflow by phone or e-mail (listed below).         11/11/11  Dear Dr.OLIN, [facility]  In an effort to better capture your patient's severity of illness, reflect appropriate length of stay and utilization of resources, a review of the patient medical record has revealed the following indicators.    Based on your clinical judgment, please clarify and document in a progress note and/or discharge summary the clinical condition associated with the following supporting information:  In responding to this query please exercise your independent judgment.  The fact that a query is asked, does not imply that any particular answer is desired or expected.  Pt's BMI= 47.3 with wt=358lbs in setting of Right knee OA/pain per H/P.   Please clarify whether or not BMI can be linked to one of he diagnoses listed below and document in pn  and d/c. Thank You!  [facility]: When linking BMI to a diagnosis please document both BMI and diagnosis together in pn for accuracy of SOI and ROM.      Possible Clinical conditions  Morbid Obesity W/ BMI=   Underweight w/BMI=  Other condition___________________  Cannot Clinically determine _____________  Risk Factors:  Right knee OA/pain   Sign &  Symptoms:  BMI-47.3 6'1"/358lbs  Treatment monitoring  Reviewed:  no additional documentation provided ljh  Thank You,  Heloise Beecham  RN, BSN, CCDS Clinical Documentation Specialist [facility] Dept Pager: [phone] / E-mail: [email or web address]

## 2011-11-11 NOTE — Progress Notes (Signed)
CARE MANAGEMENT NOTE 11/11/2011  Patient:  SLEVIN, SAURER   Account Number:  [id number]  Date Initiated:  11/11/2011  Documentation initiated by:  Sherrin Daisy  Subjective/Objective Assessment:   DX RT KNEE OSTEOARTHRITIS; TOTAL KNEE REPLACEMNT     Action/Plan:   cm SPOKE WITH PATIENT REGARDING DISCHARGE PLANNING. cURRENT PLANS ARE FOR PATIENT TO RETURN TO HIS HOME IN [place] where girlfriend will be caregiver.   Anticipated DC Date:  11/13/2011   Anticipated DC Plan:  [place]  In-house referral  Clinical Social Worker      DC Planning Services  CM consult      [facility] Choice  HOME HEALTH  DURABLE MEDICAL EQUIPMENT   Choice offered to / List presented to:  C-1 Patient   DME arranged  CPM      DME agency  [employer]        Status of service:  In process, will continue to follow  Comments:  11/11/2011 Fredonia Highland BSN CCM [phone] Pt states his bedroom is on 2nd level of home but he does have an elevator. He also has electric wheelchair and RW. Offered choice for [facility] agency-Pt would like agency that is in network. Has no specifice choice.CM will research for agency in network. [employer] does not offer services in [facility]

## 2011-11-11 NOTE — Progress Notes (Signed)
Patient hypertensive during vital sign check at 0200.  Patient reporting severe pain.  Paged and spoke to Dr. Doran Durand regarding the patient's blood pressure and how to treat patient's pain since being advised to limit the amount of narcotics given previously.  Advised to give 2 norco as ordered and follow up with blood pressure.  Will continue to monitor.

## 2011-11-11 NOTE — Progress Notes (Signed)
CSW consulted to assist with d/c planning. PN reviewed. Pt plans to d/c home with home health services. CSW is available to assist with SNF placement if pt's d/c plan changes. RNCM is presently assisting with [facility] services.  Werner Lean LCSW [phone]

## 2011-11-11 NOTE — Progress Notes (Signed)
11/11/2011 [facility] contacted and can provide HHpt to patient with start date of 11/14/2011-HH orders, op note, h&p, face sheet faxed to [facility]-spoke with Karen-fax [phone] CPM machine orderd from [employer]-spoke with rep-Rhonda-orders for cpm faxed to [phone] -confirmation received/[phone].

## 2011-11-11 NOTE — Evaluation (Signed)
Physical Therapy Evaluation Patient Details Name: Cyron Hoag MRN: [id number] DOB: [birth year] Today's Date: 11/11/2011  Problem List:  Patient Active Problem List  Diagnoses  . S/P right TKA    Past Medical History:  Past Medical History  Diagnosis Date  . Peripheral vascular disease     neuropathy in toes   . Shortness of breath     due ot pain in knees   . Sleep apnea     sleep study 3/29 13 ? location   . Pneumonia     hx of   . Diabetes mellitus   . Hypothyroidism   . Chronic kidney disease     stage III kidney disease   . Arthritis     knees, back    Past Surgical History:  Past Surgical History  Procedure Date  . Cholecystectomy     PT Assessment/Plan/Recommendation PT Assessment Clinical Impression Statement: Pt presents s/p R TKA POD 1 with decreased strength, ROM, and mobility.  Pt unable to tolerate standing due to pain in RLE and weakness in LLE.  Pts blood pressure remained 170's/70's throughout session, RN aware.  Transferred pt to chair via lift due to pt unable to stand longer than 30 secs.  Pt will benefit from skilled PT in order to address deficits.  Discussed that pt may need short term SNF stay to regain strength before returning home.  Pt reluctant to agree.   PT Recommendation/Assessment: Patient will need skilled PT in the acute care venue PT Problem List: Decreased strength;Decreased range of motion;Decreased activity tolerance;Decreased balance;Decreased mobility;Decreased knowledge of use of DME;Pain;Obesity Barriers to Discharge: None PT Therapy Diagnosis : Difficulty walking;Generalized weakness;Acute pain;Abnormality of gait PT Plan PT Frequency: 7X/week PT Treatment/Interventions: DME instruction;Gait training;Functional mobility training;Therapeutic activities;Therapeutic exercise;Balance training PT Recommendation Recommendations for Other Services: OT consult Follow Up Recommendations: Home health PT;Skilled nursing facility Equipment  Recommended: None recommended by PT PT Goals  Acute Rehab PT Goals PT Goal Formulation: With patient Time For Goal Achievement: 7 days Pt will go Supine/Side to Sit: with supervision PT Goal: Supine/Side to Sit - Progress: Goal set today Pt will go Sit to Supine/Side: with supervision PT Goal: Sit to Supine/Side - Progress: Goal set today Pt will go Sit to Stand: with mod assist PT Goal: Sit to Stand - Progress: Goal set today Pt will go Stand to Sit: with min assist PT Goal: Stand to Sit - Progress: Goal set today Pt will Transfer Bed to Chair/Chair to Bed: with mod assist PT Transfer Goal: Bed to Chair/Chair to Bed - Progress: Goal set today Pt will Ambulate: 16 - 50 feet;with mod assist;with least restrictive assistive device PT Goal: Ambulate - Progress: Goal set today Pt will Perform Home Exercise Program: with supervision, verbal cues required/provided PT Goal: Perform Home Exercise Program - Progress: Goal set today  PT Evaluation Precautions/Restrictions    Prior Functioning      Cognition   Sensation/Coordination Sensation Light Touch: Appears Intact Coordination Gross Motor Movements are Fluid and Coordinated: Yes Extremity Assessment RLE Assessment RLE Assessment: Exceptions to [facility] RLE Strength RLE Overall Strength Comments: Ankle motions WFL, unable to perform SLR LLE Assessment LLE Assessment: Exceptions to [facility] LLE Strength LLE Overall Strength Comments: Grossly 3/5 per standing assessment Mobility (including Balance) Bed Mobility Bed Mobility: Yes Supine to Sit: 4: Min assist;3: Mod assist;HOB elevated (Comment degrees);With rails Supine to Sit Details (indicate cue type and reason): Pt requires assist for RLE off of bed with cues for UE placement to  self assist trunk.  Transfers Transfers: Yes Sit to Stand: 1: +2 Total assist;Patient percentage (comment);From elevated surface;With upper extremity assist;From bed Sit to Stand Details (indicate cue type  and reason): Pt assist 20%.  Attempted sit to stand x 2 reps with pt unable to bear weight enough through RLE due to pain and LLE due to weakness (pt states he has to have L knee replaced).  Cues for hand placement and safety.  Stand to Sit: 1: +2 Total assist;Patient percentage (comment);With upper extremity assist;To bed;To elevated surface Stand to Sit Details: Pt assist 20%.  Assist for controlled descent with cues for hand placement for safety.  Transfer via Immunologist Ambulation/Gait Ambulation/Gait: No (Pt unable to amb due to pain in RLE, weakness in LLE) Stairs: No    Exercise    End of Session PT - End of Session Equipment Utilized During Treatment: Gait belt;Right knee immobilizer Activity Tolerance: Patient limited by pain;Other (comment) (Limited by nausea) Patient left: in chair;with call bell in reach;with family/visitor present Nurse Communication: Mobility status for transfers;Need for lift equipment General Behavior During Session: [facility] for tasks performed Cognition: [facility] for tasks performed  Page, Betha Loa 11/11/2011, 3:16 PM

## 2011-11-11 NOTE — Progress Notes (Signed)
PT note:  First PT session deferred due to pts blood pressure 197/89 and heart rate 50.  Will check back in pm on pt to determine if BP is under control.    Thanks,  Terisa Starr, PT Pager: [phone]

## 2011-11-12 LAB — BASIC METABOLIC PANEL
BUN: 25 mg/dL — ABNORMAL HIGH (ref 6–23)
CO2: 25 mEq/L (ref 19–32)
Chloride: 101 mEq/L (ref 96–112)
Creatinine, Ser: 2.7 mg/dL — ABNORMAL HIGH (ref 0.50–1.35)
GFR calc Af Amer: 27 mL/min — ABNORMAL LOW (ref >90)
Potassium: 3.8 mEq/L (ref 3.5–5.1)

## 2011-11-12 LAB — CBC
HCT: 38.8 % — ABNORMAL LOW (ref 39.0–52.0)
MCV: 91.7 fL (ref 78.0–100.0)
RBC: 4.23 MIL/uL (ref 4.22–5.81)
WBC: 7.8 10*3/uL (ref 4.0–10.5)

## 2011-11-12 LAB — GLUCOSE, CAPILLARY: Glucose-Capillary: 122 mg/dL — ABNORMAL HIGH (ref 70–99)

## 2011-11-12 NOTE — Progress Notes (Signed)
Physical Therapy Treatment Patient Details Name: [name] [name] MRN: [id number] DOB: 1945/09/17 Today's Date: 11/12/2011  PT Assessment/Plan  PT - Assessment/Plan Comments on Treatment Session: Pt with improved mobility today. Able to take a few steps with RW to chair, but with limited ablitly to WB onto LLE. Increased time and effort with all mobility. PT Plan: Discharge plan remains appropriate PT Frequency: 7X/week Recommendations for Other Services: OT consult Follow Up Recommendations: Home health PT;Skilled nursing facility Equipment Recommended: None recommended by PT PT Goals  Acute Rehab PT Goals PT Goal Formulation: With patient Time For Goal Achievement: 7 days Pt will go Supine/Side to Sit: with supervision PT Goal: Supine/Side to Sit - Progress: Progressing toward goal Pt will go Sit to Supine/Side: with supervision PT Goal: Sit to Supine/Side - Progress: Progressing toward goal Pt will go Sit to Stand: with mod assist PT Goal: Sit to Stand - Progress: Progressing toward goal Pt will go Stand to Sit: with min assist PT Goal: Stand to Sit - Progress: Progressing toward goal Pt will Transfer Bed to Chair/Chair to Bed: with mod assist PT Transfer Goal: Bed to Chair/Chair to Bed - Progress: Progressing toward goal Pt will Ambulate: 16 - 50 feet;with mod assist;with least restrictive assistive device PT Goal: Ambulate - Progress: Progressing toward goal Pt will Perform Home Exercise Program: with supervision, verbal cues required/provided PT Goal: Perform Home Exercise Program - Progress: Progressing toward goal  PT Treatment Precautions/Restrictions  Precautions Precautions: Knee Required Braces or Orthoses: Yes Knee Immobilizer: Discontinue once straight leg raise with < 10 degree lag Restrictions Weight Bearing Restrictions: No Other Position/Activity Restrictions: WBAT Mobility (including Balance) Bed Mobility Bed Mobility: Yes Supine to Sit: 1: +2 Total  assist;With rails;HOB elevated (Comment degrees) (HOB 30*) Supine to Sit Details (indicate cue type and reason): min A to support RLE, and to elevate trunk; increased time, labored Transfers Transfers: Yes Sit to Stand: 1: +2 Total assist;Patient percentage (comment);From elevated surface;With upper extremity assist;From bed Sit to Stand Details (indicate cue type and reason): pt 75%; +2 for safety 2* fall risk (pt reports weakness in LLE) Stand to Sit: 1: +2 Total assist;Patient percentage (comment);With upper extremity assist;To bed;To elevated surface Stand to Sit Details: VCs hand placement, assist to support RLE; assist to control descent; pt 75% Ambulation/Gait Ambulation/Gait: Yes Ambulation/Gait Assistance: 1: +2 Total assist Ambulation/Gait Assistance Details (indicate cue type and reason): pt 75%; +2 for safety due to fall risk; increased time; decreased WB LLE; labored Ambulation Distance (Feet): 4 Feet Assistive device: Rolling walker (wide RW) Gait Pattern: Decreased step length - right;Decreased step length - left;Step-to pattern;Antalgic    Exercise  Total Joint Exercises Ankle Circles/Pumps: AROM;Both;10 reps Quad Sets: AROM;Both;10 reps Short Arc QuadSinclair Ship;Right;10 reps Heel Slides: AAROM;Right;10 reps Straight Leg Raises: AAROM;Right;5 reps End of Session PT - End of Session Equipment Utilized During Treatment: Gait belt;Right knee immobilizer Activity Tolerance: Patient limited by pain;Patient limited by fatigue (difficulty with WB onto non-operative LLE with walking 2* OA) Patient left: in chair;with call bell in reach Nurse Communication: Mobility status for transfers;Need for lift equipment General Behavior During Session: [facility] for tasks performed Cognition: [facility] for tasks performed  Philomena Doheny 11/12/2011, 9:45 AM [phone]

## 2011-11-12 NOTE — Progress Notes (Signed)
Physical Therapy Treatment Patient Details Name: [name] [name] MRN: [id number] DOB: 11/29/1945 Today's Date: 11/12/2011  PT Assessment/Plan  PT - Assessment/Plan Comments on Treatment Session: Knee flexion AAROM limited by pain to approx 30*. Pt progressing slowly for POD #2, may need to go to SNF, depending on progress. Decreased ability to WB on non-operative LLE limits ambulation tolerance.  PT Plan: Discharge plan remains appropriate PT Frequency: 7X/week Recommendations for Other Services: OT consult Follow Up Recommendations: Home health PT;Skilled nursing facility Equipment Recommended: Defer to next venue PT Goals  Acute Rehab PT Goals PT Goal Formulation: With patient Time For Goal Achievement: 7 days Pt will go Supine/Side to Sit: with supervision PT Goal: Supine/Side to Sit - Progress: Progressing toward goal Pt will go Sit to Supine/Side: with supervision PT Goal: Sit to Supine/Side - Progress: Not met Pt will go Sit to Stand: with mod assist PT Goal: Sit to Stand - Progress: Progressing toward goal Pt will go Stand to Sit: with min assist PT Goal: Stand to Sit - Progress: Progressing toward goal Pt will Transfer Bed to Chair/Chair to Bed: with mod assist PT Transfer Goal: Bed to Chair/Chair to Bed - Progress: Progressing toward goal Pt will Ambulate: 16 - 50 feet;with mod assist;with least restrictive assistive device PT Goal: Ambulate - Progress: Progressing toward goal Pt will Perform Home Exercise Program: with supervision, verbal cues required/provided PT Goal: Perform Home Exercise Program - Progress: Progressing toward goal  PT Treatment Precautions/Restrictions  Precautions Precautions: Knee Required Braces or Orthoses DO NOT USE: Yes Required Braces or Orthoses: Knee Immobilizer - Right Knee Immobilizer - Right: Discontinue once straight leg raise with < 10 degree lag Knee Immobilizer DO NOT USE: Discontinue once straight leg raise with < 10 degree  lag Restrictions Weight Bearing Restrictions: No Other Position/Activity Restrictions: WBAT Mobility (including Balance) Bed Mobility Bed Mobility: No (MaxiSky mechanical lift used for chair to bed transfer) Transfers Transfers: No (MaxiSKy for chair to bed) Ambulation/Gait Ambulation/Gait: No    Exercise  Total Joint Exercises Ankle Circles/Pumps: AROM;Both;10 reps Quad Sets: AROM;Both;10 reps Short Arc QuadSinclair Ship;Right;10 reps Heel Slides: AAROM;Right;10 reps Hip ABduction/ADduction: AAROM;Right;10 reps;Seated Straight Leg Raises: AAROM;Right;10 reps End of Session PT - End of Session Equipment Utilized During Treatment: Gait belt;Right knee immobilizer Activity Tolerance: Patient limited by pain;Patient limited by fatigue Patient left: in bed;with call bell in reach;with family/visitor present Nurse Communication: Mobility status for transfers;Need for lift equipment General Behavior During Session: [facility] for tasks performed Cognition: [facility] for tasks performed  Philomena Doheny 11/12/2011, 1:56 PM [phone]

## 2011-11-12 NOTE — Progress Notes (Signed)
Subjective: 2 Days Post-Op Procedure(s) (LRB): TOTAL KNEE ARTHROPLASTY (Right)   Patient reports pain as mild. He feels that his pain is much better controled than it was yesterday. States he had a relatively pain free night. He had a rough day with PT yesterday and that has even been better today.  Objective:   VITALS:   Filed Vitals:   11/12/11 1000  BP: 177/76  Pulse:   Temp:   Resp:     Neurovascular intact Dorsiflexion/Plantar flexion intact Incision: dressing C/D/I No cellulitis present Compartment soft  LABS  Basename 11/12/11 0420 11/11/11 0412  HGB 12.8* 13.1  HCT 38.8* 40.3  WBC 7.8 7.8  PLT 137* 140*     Basename 11/12/11 0420 11/11/11 0412  NA 134* 135  K 3.8 3.9  BUN 25* 19  CREATININE 2.70* 2.31*  GLUCOSE 103* 142*     Assessment/Plan: 2 Days Post-Op Procedure(s) (LRB): TOTAL KNEE ARTHROPLASTY (Right)   Up with therapy Plan for discharge tomorrow to home if he continues to do well.   West Pugh Davyon Fisch   PAC  11/12/2011, 10:07 AM

## 2011-11-12 NOTE — Evaluation (Addendum)
Occupational Therapy Evaluation Patient Details Name: Johney Rafiq MRN: [id number] DOB: 1945/10/30 Today's Date: 11/12/2011  Problem List:  Patient Active Problem List  Diagnoses  . S/P right TKA    Past Medical History:  Past Medical History  Diagnosis Date  . Peripheral vascular disease     neuropathy in toes   . Shortness of breath     due ot pain in knees   . Sleep apnea     sleep study 3/29 13 ? location   . Pneumonia     hx of   . Diabetes mellitus   . Hypothyroidism   . Chronic kidney disease     stage III kidney disease   . Arthritis     knees, back    Past Surgical History:  Past Surgical History  Procedure Date  . Cholecystectomy     OT Assessment/Plan/Recommendation OT Assessment Clinical Impression Statement: Pt is s/p R TKA and displays weakness in L LE also, decreased functional mobility, increased pain and will benefit from continued OT services to improve his ADL independence for next venue of care.  OT Recommendation/Assessment: Patient will need skilled OT in the acute care venue OT Problem List: Decreased strength;Decreased activity tolerance;Decreased knowledge of use of DME or AE;Pain OT Therapy Diagnosis : Generalized weakness OT Plan OT Frequency: Min 1X/week OT Treatment/Interventions: Self-care/ADL training;Therapeutic activities;DME and/or AE instruction;Patient/family education OT Recommendation Follow Up Recommendations: Skilled nursing facility Equipment Recommended: Defer to next venue Individuals Consulted Consulted and Agree with Results and Recommendations: Patient OT Goals Acute Rehab OT Goals OT Goal Formulation: With patient Time For Goal Achievement: 7 days ADL Goals Pt Will Perform Grooming: with min assist;Standing at sink ADL Goal: Grooming - Progress: Goal set today Pt Will Perform Lower Body Bathing: with min assist;Sit to stand from bed;Sit to stand from chair;with adaptive equipment ADL Goal: Lower Body Bathing -  Progress: Goal set today Pt Will Perform Lower Body Dressing: with min assist;Sit to stand from chair;Sit to stand from bed;with adaptive equipment ADL Goal: Lower Body Dressing - Progress: Goal set today Pt Will Transfer to Toilet: with min assist;Ambulation;3-in-1 ADL Goal: Toilet Transfer - Progress: Goal set today Pt Will Perform Toileting - Clothing Manipulation: with min assist;Standing ADL Goal: Toileting - Clothing Manipulation - Progress: Goal set today  OT Evaluation Precautions/Restrictions  Precautions Precautions: Knee KI for R LE until able to SLR with < 10 degree lag Weight Bearing Restrictions: No Other Position/Activity Restrictions: WBAT Prior Functioning Home Living Lives With: Spouse;Daughter Receives Help From: Family Type of Home: Apartment Home Layout: Two level (has elevator to get in ) Bathroom Shower/Tub: Chiropodist: Standard Bathroom Accessibility: Yes How Accessible: Accessible via walker Home Adaptive Equipment: Walker - rolling;Shower chair with back Prior Function Driving: Yes Vocation: Retired  ADL ADL Eating/Feeding: Simulated;Independent Where Assessed - Eating/Feeding: Chair Grooming: Simulated;Set up Where Assessed - Grooming: Sitting, chair Upper Body Bathing: Simulated;Chest;Right arm;Left arm;Abdomen;Supervision/safety;Set up Where Assessed - Upper Body Bathing: Sitting, bed;Unsupported Lower Body Bathing: +2 Total assistance;Comment for patient %;Simulated Lower Body Bathing Details (indicate cue type and reason): pt 40%  Where Assessed - Lower Body Bathing: Sit to stand from bed Upper Body Dressing: Simulated;Supervision/safety;Set up Where Assessed - Upper Body Dressing: Sitting, bed;Unsupported Lower Body Dressing: Simulated;+2 Total assistance;Comment for patient % Lower Body Dressing Details (indicate cue type and reason): pt <10%. Unable to reach fully to either foot to simulate starting pants or socks on  feet. Needs bilateral UE on walker to balance-unable  to free UEs to simulate pulling up clothing.  Where Assessed - Lower Body Dressing: Sit to stand from bed Toilet Transfer: Simulated;+2 Total assistance;Comment for patient % Toilet Transfer Details (indicate cue type and reason): pt 75% Toilet Transfer Method: Stand pivot Toileting - Clothing Manipulation: Simulated;+2 Total assistance;Comment for patient % Toileting - Clothing Manipulation Details (indicate cue type and reason): pt 0% Where Assessed - Toileting Clothing Manipulation: Standing Toileting - Hygiene: Simulated;+2 Total assistance;Comment for patient % Toileting - Hygiene Details (indicate cue type and reason): pt 0% Where Assessed - Toileting Hygiene: Standing Equipment Used: Rolling walker ADL Comments: Cotx with PT. Increased time for patient and very difficult to transfer bed to chair. Pt able to step around toward chair but then chair brought up behind him.  Vision/Perception  Vision - History Baseline Vision: No visual deficits Cognition Cognition Arousal/Alertness: Awake/alert Overall Cognitive Status: Appears within functional limits for tasks assessed Orientation Level: Oriented X4 Sensation/Coordination Sensation Light Touch: Appears Intact Extremity Assessment RUE Assessment RUE Assessment: Within Functional Limits LUE Assessment LUE Assessment: Within Functional Limits Mobility  Bed Mobility Bed Mobility: Yes Supine to Sit: 1: +2 Total assist;With rails;HOB elevated (Comment degrees) (HOB 30*) Supine to Sit Details (indicate cue type and reason): min A to support RLE, and to elevate trunk; increased time, labored Transfers Sit to Stand: 1: +2 Total assist;Patient percentage (comment);From elevated surface;With upper extremity assist;From bed Sit to Stand Details (indicate cue type and reason): pt 75%; +2 for safety 2* fall risk (pt reports weakness in LLE) Stand to Sit: 1: +2 Total assist;Patient  percentage (comment);With upper extremity assist;To bed;To elevated surface Stand to Sit Details: VCs hand placement, assist to support RLE; assist to control descent; pt 75% End of Session OT - End of Session Equipment Utilized During Treatment: Gait belt Activity Tolerance: Patient limited by pain Patient left: in chair;with call bell in reach General Behavior During Session: [facility] for tasks performed Cognition: [facility] for tasks performed   Jules Schick [id number] 11/12/2011, 10:34 AM

## 2011-11-13 MED ORDER — METHOCARBAMOL 500 MG PO TABS: 500.0000 mg | ORAL_TABLET | Freq: Four times a day (QID) | ORAL | Status: AC | PRN

## 2011-11-13 MED ORDER — OXYCODONE HCL 5 MG PO TABS: 5.0000 mg | ORAL_TABLET | ORAL | Status: AC

## 2011-11-13 MED ORDER — DSS 100 MG PO CAPS: 100.0000 mg | ORAL_CAPSULE | Freq: Two times a day (BID) | ORAL | Status: AC

## 2011-11-13 MED ORDER — FERROUS SULFATE 325 (65 FE) MG PO TABS: 325.0000 mg | ORAL_TABLET | Freq: Three times a day (TID) | ORAL | Status: DC

## 2011-11-13 MED ORDER — ASPIRIN EC 325 MG PO TBEC: 325.0000 mg | DELAYED_RELEASE_TABLET | Freq: Two times a day (BID) | ORAL | Status: AC

## 2011-11-13 MED ORDER — OXYCODONE HCL 5 MG PO TABS
5.0000 mg | ORAL_TABLET | ORAL | Status: DC
  Filled 2011-11-13: qty 2

## 2011-11-13 MED ORDER — POLYETHYLENE GLYCOL 3350 17 G PO PACK: 17.0000 g | PACK | Freq: Every day | ORAL | Status: AC | PRN

## 2011-11-13 NOTE — Discharge Summary (Signed)
Physician Discharge Summary  Patient ID: [name] [name] MRN: [id number] DOB/AGE: [birth year] 66 y.o.  Admit date: 11/10/2011 Discharge date: 11/13/2011  Procedures:  Procedure(s) (LRB): TOTAL KNEE ARTHROPLASTY (Right)  Attending Physician:  Dr. Paralee Cancel   Admission Diagnoses: Right knee OA and pain   Discharge Diagnoses:  Principal Problem:  *S/P right TKA Peripheral vascular disease - neuropathy in toes   Shortness of breath   Sleep apnea  - sleep study 10/30/11  Pneumonia - hx of   Diabetes mellitus   Hypothyroidism   Chronic kidney disease - stage III kidney disease   Arthritis    HPI: Pt is a 66 y.o. male complaining of right knee pain for 4-5 years. Pain had continually increased since the beginning. X-rays in the clinic show end-stage arthritic changes of the right knee. Pt has tried various conservative treatments which have failed to alleviate their symptoms including steroid injections which didn't help at all. Various options are discussed with the patient. Risks, benefits and expectations were discussed with the patient. Patient understand the risks, benefits and expectations and wishes to proceed with surgery.   PCP: [name] Logan, MD, MD   Discharged Condition: good  Hospital Course:  Patient underwent the above stated procedure on 11/10/2011. Patient tolerated the procedure well and brought to the recovery room in good condition and subsequently to the floor.  POD #1 BP: 183/89 ; Pulse: 52 ; Temp: 98.1 F (36.7 C) ; Resp: 12  Pt's foley was removed, as well as the hemovac drain removed. IV was changed to a saline lock. Patient reports pain as moderate to severe at times. Pain issues all throughout the night and unable to get comfortable. Dorsiflexion/plantar flexion intact, incision: dressing C/[name]/I, no cellulitis present and compartment soft.   LABS  Basename  11/11/11 0412   HGB  13.1  HCT  40.3   POD #2  BP: 156/65 ; Pulse: 67 ; Temp: 97.9 F (36.6 C) ;  Resp: 12  Patient reports pain as mild. He feels that his pain is much better controled than it was yesterday. States he had a relatively pain free night. He had a rough day with PT yesterday and that has even been better today. Dorsiflexion/plantar flexion intact, incision: dressing C/[name]/I, no cellulitis present and compartment soft.   LABS  Basename  11/12/11 0420   HGB  12.8  HCT  38.8   POD #3  BP: 181/95 ; Pulse: 88 ; Temp: 99.3 F (37.4 C) ; Resp: 16  Patient reports pain as mild, while not moving the knee. States that the pain is well controlled. States that his left leg is weak, this was occuring prior to surgery, but he didn't realize to what extent. He states that he has been little slow in PT, but he is going to work hard today and at home as well. With the need for extra help he and his girlfriend think it would be best to go to a facility to get extra help. He is ready to be discharged today. Dorsiflexion/plantar flexion intact, incision: dressing C/[name]/I, no cellulitis present and compartment soft.  LABS   No new labs   Discharge Exam: General appearance: alert, cooperative and no distress Extremities: Homans sign is negative, no sign of DVT, no edema, redness or tenderness in the calves or thighs and no ulcers, gangrene or trophic changes  Disposition:  SNF / Rehab with follow up in 2 weeks  Follow-up Information    Follow up with [name] [name] in 2  weeks.   Contact information:   [facility] [address] [place] [phone]          Discharge Orders    Future Orders Please Complete By Expires   Diet - low sodium heart healthy      Call MD / Call 911      Comments:   If you experience chest pain or shortness of breath, CALL 911 and be transported to the hospital emergency room.  If you develope a fever above 101 F, pus (white drainage) or increased drainage or redness at the wound, or calf pain, call your  surgeon's office.   Discharge instructions      Comments:   Maintain surgical dressing for 8 days, then replace with gauze and tape. Keep the area dry and clean until follow up. Follow up in 2 weeks at [facility]. Call with any questions or concerns.     Constipation Prevention      Comments:   Drink plenty of fluids.  Prune juice may be helpful.  You may use a stool softener, such as Colace (over the counter) 100 mg twice a day.  Use MiraLax (over the counter) for constipation as needed.   Increase activity slowly as tolerated      Weight Bearing as taught in Physical Therapy      Comments:   Use a walker or crutches as instructed.   Driving restrictions      Comments:   No driving for 4 weeks   TED hose      Comments:   Use stockings (TED hose) for 2 weeks on both leg(s).  You may remove them at night for sleeping.   Change dressing      Comments:   Maintain surgical dressing for 8 days, then change the dressing daily with sterile 4 x 4 inch gauze dressing and tape. Keep the area dry and clean.     Current Discharge Medication List    START taking these medications   Details  aspirin EC 325 MG tablet Take 1 tablet (325 mg total) by mouth 2 (two) times daily. X 4 weeks Qty: 60 tablet, Refills: 0    docusate sodium 100 MG CAPS Take 100 mg by mouth 2 (two) times daily.    ferrous sulfate 325 (65 FE) MG tablet Take 1 tablet (325 mg total) by mouth 3 (three) times daily after meals.    methocarbamol (ROBAXIN) 500 MG tablet Take 1 tablet (500 mg total) by mouth every 6 (six) hours as needed (muscle spasms).    oxyCODONE (OXY IR/ROXICODONE) 5 MG immediate release tablet Take 1-2 tablets (5-10 mg total) by mouth every 4 (four) hours. Qty: 120 tablet, Refills: 0    polyethylene glycol (MIRALAX / GLYCOLAX) packet Take 17 g by mouth daily as needed.      CONTINUE these medications which have NOT CHANGED   Details  furosemide (LASIX) 80 MG tablet Take 80 mg by mouth  every morning.    glyBURIDE (DIABETA) 5 MG tablet Take 5 mg by mouth daily with breakfast.    insulin glargine (LANTUS) 100 UNIT/ML injection Inject 51 Units into the skin at bedtime.    levothyroxine (SYNTHROID, LEVOTHROID) 300 MCG tablet Take 300 mcg by mouth every morning.    losartan (COZAAR) 100 MG tablet Take 100 mg by mouth every morning.    rosuvastatin (CRESTOR) 5 MG tablet Take 5 mg by mouth every morning.    testosterone cypionate (  DEPOTESTOTERONE CYPIONATE) 100 MG/ML injection Inject into the muscle every 28 (twenty-eight) days. For IM use only    verapamil (CALAN) 80 MG tablet Take 180 mg by mouth every morning.       STOP taking these medications     aspirin 81 MG chewable tablet Comments:  Reason for Stopping:          Signed:  West Pugh. Collin Rengel   PAC  11/13/2011, 9:03 AM

## 2011-11-13 NOTE — Progress Notes (Signed)
Subjective: 3 Days Post-Op Procedure(s) (LRB): TOTAL KNEE ARTHROPLASTY (Right)   Patient reports pain as mild, while not moving the knee. States that the pain is well controlled. States that his left leg is weak, this was occuring prior to surgery, but he didn't realize to what extent. He states that he has been little slow in PT, but he is going to work hard today and at home as well.  Objective:   VITALS:   Filed Vitals:   11/13/11 0453  BP: 181/95  Pulse: 88  Temp: 99.3 F (37.4 C)  Resp: 16    Neurovascular intact Dorsiflexion/Plantar flexion intact Incision: dressing C/D/I No cellulitis present Compartment soft  LABS  Basename 11/12/11 0420 11/11/11 0412  HGB 12.8* 13.1  HCT 38.8* 40.3  WBC 7.8 7.8  PLT 137* 140*     Basename 11/12/11 0420 11/11/11 0412  NA 134* 135  K 3.8 3.9  BUN 25* 19  CREATININE 2.70* 2.31*  GLUCOSE 103* 142*     Assessment/Plan: 3 Days Post-Op Procedure(s) (LRB): TOTAL KNEE ARTHROPLASTY (Right)   Up with therapy Discharge home with home health, if he does well with 2 PT sessions Follow up in 2 weeks at [facility].  Follow-up Information    Follow up with OLIN,Eryk Beavers D in 2 weeks.   Contact information:   [facility] [address] [place] Giselle Brutus   PAC  11/13/2011, 8:08 AM

## 2011-11-13 NOTE — Progress Notes (Signed)
Physical Therapy Treatment Patient Details Name: [name] [name] MRN: [id number] DOB: 06-Jan-1946 Today's Date: 11/13/2011 [id number] PT Assessment/Plan  PT - Assessment/Plan Comments on Treatment Session: pt appears more awake, has  very little pain of<3/10. pt for SNF rehab.Pt has ambulated 2 short distances today for first time.  PT Plan: Discharge plan remains appropriate Follow Up Recommendations: Skilled nursing facility Equipment Recommended: None recommended by PT PT Goals  Acute Rehab PT Goals Pt will go Sit to Supine/Side: with supervision PT Goal: Sit to Supine/Side - Progress: Progressing toward goal Pt will go Sit to Stand: with mod assist PT Goal: Sit to Stand - Progress: Progressing toward goal Pt will go Stand to Sit: with min assist PT Goal: Stand to Sit - Progress: Progressing toward goal Pt will Transfer Bed to Chair/Chair to Bed: with modified independence Pt will Ambulate: 16 - 50 feet;with min assist;with rolling walker PT Goal: Ambulate - Progress: Progressing toward goal Pt will Perform Home Exercise Program: with supervision, verbal cues required/provided PT Goal: Perform Home Exercise Program - Progress: Progressing toward goal  PT Treatment Precautions/Restrictions  Precautions Precautions: Knee Required Braces or Orthoses DO NOT USE: Yes Required Braces or Orthoses: Knee Immobilizer - Right Knee Immobilizer - Right: Discontinue once straight leg raise with < 10 degree lag Knee Immobilizer DO NOT USE: Discontinue once straight leg raise with < 10 degree lag Restrictions Weight Bearing Restrictions: No Other Position/Activity Restrictions: WBAT Mobility (including Balance) Bed Mobility Sit to Supine: 4: Min assist Sit to Supine - Details (indicate cue type and reason): assist for RLE onto bed., pt moving much better w/ less assistance Transfers Sit to Stand: 1: +2 Total assist;From chair/3-in-1;With upper extremity assist Sit to Stand Details (indicate cue  type and reason): vc to push from recliner, pt=60%., improved from first session Stand to Sit: 4: Min assist;With upper extremity assist;To bed;To elevated surface Stand to Sit Details: pt able to step RLE out and reach to bed w. vc Ambulation/Gait Ambulation/Gait Assistance: 1: +2 Total assist Ambulation/Gait Assistance Details (indicate cue type and reason): pt=70% , vc for sequence, increased time Ambulation Distance (Feet): 8 Feet Assistive device: Rolling walker Gait Pattern: Step-to pattern Gait velocity: slower than normal ,DOE    Exercise  Total Joint Exercises Ankle Circles/Pumps: AROM;Both;10 reps Quad Sets: AROM;Both;10 reps Short Arc QuadSinclair Ship;Right;10 reps Heel Slides: AAROM;Right;10 reps Hip ABduction/ADduction: AAROM;Right;10 reps;Seated Straight Leg Raises: AAROM;Right;10 reps (pt used sheet to self assist) End of Session PT - End of Session Equipment Utilized During Treatment: Left knee immobilizer Activity Tolerance: Patient tolerated treatment well Patient left: in bed;with call bell in reach Nurse Communication: Mobility status for transfers General Behavior During Session: [facility] for tasks performed (more alert this session) Cognition: [facility] for tasks performed  Claretha Cooper 11/13/2011, 2:48 PM

## 2011-11-13 NOTE — Progress Notes (Signed)
CSW assisting with d/c planning. Pt had planned to d/c home today but now requires ST SNF placement. Pt has accepted a bed at the [facility] in [place]. CSW will assist with d/c planning to the [place] center today via [facility] transport.  Werner Lean  LCSW [phone]

## 2011-11-13 NOTE — Progress Notes (Signed)
Physical Therapy Treatment Patient Details Name: [name] [name] MRN: [id number] DOB: Mar 19, 1946 Today's Date: 11/13/2011  PT Assessment/Plan  PT - Assessment/Plan Comments on Treatment Session: encouraged pt to consider SNF rehab. PA also spoke w/ pt. and pt's girlfriend. pt now will consider rehab at snf.  pt is very sleepy. pt was able to begin ambulation today w/ 2 persons and one to keep recliner close.  PT Plan: Discharge plan remains appropriate;Frequency remains appropriate Equipment Recommended: None recommended by PT PT Goals  Acute Rehab PT Goals Pt will go Supine/Side to Sit: with supervision PT Goal: Supine/Side to Sit - Progress: Progressing toward goal Pt will go Sit to Supine/Side: with supervision PT Goal: Sit to Supine/Side - Progress: Progressing toward goal Pt will go Sit to Stand: with mod assist PT Goal: Sit to Stand - Progress: Progressing toward goal Pt will go Stand to Sit: with min assist PT Goal: Stand to Sit - Progress: Progressing toward goal Pt will Ambulate: 16 - 50 feet PT Goal: Ambulate - Progress: Progressing toward goal  PT Treatment Precautions/Restrictions  Precautions Precautions: Knee Required Braces or Orthoses DO NOT USE: Yes Required Braces or Orthoses: Knee Immobilizer - Right Knee Immobilizer - Right: Discontinue once straight leg raise with < 10 degree lag Knee Immobilizer DO NOT USE: Discontinue once straight leg raise with < 10 degree lag Restrictions Weight Bearing Restrictions: No Other Position/Activity Restrictions: WBAT Mobility (including Balance) Bed Mobility Supine to Sit: 1: +2 Total assist;HOB elevated (Comment degrees);With rails Supine to Sit Details (indicate cue type and reason): pt= 50% w/ HOB 50, pt did arouse for activity. increased time, tends to hold his breath. Transfers Sit to Stand: 1: +2 Total assist;From bed;From elevated surface Sit to Stand Details (indicate cue type and reason): Pt= 60% with bed elevated, at  RW. pt was able to stand up Stand to Sit: 3: Mod assist Stand to Sit Details: vc to reach back for recliner, step RLE forward Ambulation/Gait Ambulation/Gait: Yes Ambulation/Gait Assistance: 1: +2 Total assist Ambulation/Gait Assistance Details (indicate cue type and reason): vc for step length, ectra time, frequent rests, vc for sequence Ambulation Distance (Feet): 8 Feet Assistive device: Rolling walker Gait Pattern: Step-to pattern Gait velocity: slow    Exercise    End of Session PT - End of Session Equipment Utilized During Treatment: Right knee immobilizer Activity Tolerance: Patient limited by fatigue;Patient limited by pain Patient left: in chair;with call bell in reach Nurse Communication: Mobility status for transfers General Behavior During Session: Lethargic (pt does arouse with stimulation) Cognition: [facility] for tasks performed  Claretha Cooper 11/13/2011, 10:02 [id number] [phone]

## 2011-11-13 NOTE — Progress Notes (Signed)
11/13/2011 Fred[place] Highland BSN CCM [phone] Plans have changed to SNF rehab. Patient requiring +2 assistance. Referred to Clinical Education officer, museum. CM will follow as needed.

## 2011-11-16 NOTE — Progress Notes (Signed)
Discharge summary sent to payer through [facility]  

## 2011-11-24 ENCOUNTER — Encounter (HOSPITAL_COMMUNITY): Payer: Self-pay | Admitting: Orthopedic Surgery

## 2011-12-08 ENCOUNTER — Encounter (HOSPITAL_COMMUNITY): Admission: RE | Payer: Self-pay | Source: Ambulatory Visit

## 2011-12-08 ENCOUNTER — Ambulatory Visit (HOSPITAL_COMMUNITY): Admission: RE | Admit: 2011-12-08 | Payer: Medicare Other | Source: Ambulatory Visit | Admitting: Orthopedic Surgery

## 2011-12-08 SURGERY — ARTHROPLASTY, KNEE, TOTAL
Anesthesia: Spinal | Site: Knee | Laterality: Left

## 2012-05-23 ENCOUNTER — Ambulatory Visit: Payer: Medicare Other | Attending: Orthopedic Surgery | Admitting: Physical Therapy

## 2012-05-23 DIAGNOSIS — Z96659 Presence of unspecified artificial knee joint: Secondary | ICD-10-CM | POA: Insufficient documentation

## 2012-05-23 DIAGNOSIS — M25569 Pain in unspecified knee: Secondary | ICD-10-CM | POA: Insufficient documentation

## 2012-05-23 DIAGNOSIS — R269 Unspecified abnormalities of gait and mobility: Secondary | ICD-10-CM | POA: Insufficient documentation

## 2012-05-23 DIAGNOSIS — M25669 Stiffness of unspecified knee, not elsewhere classified: Secondary | ICD-10-CM | POA: Insufficient documentation

## 2012-05-23 DIAGNOSIS — R5381 Other malaise: Secondary | ICD-10-CM | POA: Insufficient documentation

## 2012-05-23 DIAGNOSIS — IMO0001 Reserved for inherently not codable concepts without codable children: Secondary | ICD-10-CM | POA: Insufficient documentation

## 2012-05-24 ENCOUNTER — Ambulatory Visit: Payer: Medicare Other | Admitting: Physical Therapy

## 2012-05-26 ENCOUNTER — Ambulatory Visit: Payer: Medicare Other | Admitting: Physical Therapy

## 2012-05-30 ENCOUNTER — Ambulatory Visit: Payer: Medicare Other | Admitting: Physical Therapy

## 2012-06-01 ENCOUNTER — Ambulatory Visit: Payer: Medicare Other | Admitting: Physical Therapy

## 2012-06-03 ENCOUNTER — Ambulatory Visit: Payer: Medicare Other | Attending: Orthopedic Surgery | Admitting: Physical Therapy

## 2012-06-03 DIAGNOSIS — R269 Unspecified abnormalities of gait and mobility: Secondary | ICD-10-CM | POA: Insufficient documentation

## 2012-06-03 DIAGNOSIS — M25669 Stiffness of unspecified knee, not elsewhere classified: Secondary | ICD-10-CM | POA: Insufficient documentation

## 2012-06-03 DIAGNOSIS — Z96659 Presence of unspecified artificial knee joint: Secondary | ICD-10-CM | POA: Insufficient documentation

## 2012-06-03 DIAGNOSIS — IMO0001 Reserved for inherently not codable concepts without codable children: Secondary | ICD-10-CM | POA: Insufficient documentation

## 2012-06-03 DIAGNOSIS — M25569 Pain in unspecified knee: Secondary | ICD-10-CM | POA: Insufficient documentation

## 2012-06-03 DIAGNOSIS — R5381 Other malaise: Secondary | ICD-10-CM | POA: Insufficient documentation

## 2012-06-06 ENCOUNTER — Ambulatory Visit: Payer: Medicare Other | Admitting: *Deleted

## 2012-06-08 ENCOUNTER — Ambulatory Visit: Payer: Medicare Other | Admitting: *Deleted

## 2012-06-10 ENCOUNTER — Ambulatory Visit: Payer: Medicare Other | Admitting: Physical Therapy

## 2012-06-13 ENCOUNTER — Ambulatory Visit: Payer: Medicare Other | Admitting: Physical Therapy

## 2012-06-15 ENCOUNTER — Ambulatory Visit: Payer: Medicare Other | Admitting: Physical Therapy

## 2012-06-17 ENCOUNTER — Ambulatory Visit: Payer: Medicare Other | Admitting: Physical Therapy

## 2012-06-21 ENCOUNTER — Ambulatory Visit: Payer: Medicare Other | Admitting: Physical Therapy

## 2012-06-22 ENCOUNTER — Ambulatory Visit: Payer: Medicare Other | Admitting: Physical Therapy

## 2012-06-24 ENCOUNTER — Ambulatory Visit: Payer: Medicare Other | Admitting: Physical Therapy

## 2012-06-27 ENCOUNTER — Ambulatory Visit: Payer: Medicare Other | Admitting: Physical Therapy

## 2012-06-29 ENCOUNTER — Ambulatory Visit: Payer: Medicare Other | Admitting: Physical Therapy

## 2012-07-04 ENCOUNTER — Ambulatory Visit: Payer: Medicare Other | Attending: Orthopedic Surgery | Admitting: *Deleted

## 2012-07-04 DIAGNOSIS — Z96659 Presence of unspecified artificial knee joint: Secondary | ICD-10-CM | POA: Insufficient documentation

## 2012-07-04 DIAGNOSIS — M25569 Pain in unspecified knee: Secondary | ICD-10-CM | POA: Insufficient documentation

## 2012-07-04 DIAGNOSIS — IMO0001 Reserved for inherently not codable concepts without codable children: Secondary | ICD-10-CM | POA: Insufficient documentation

## 2012-07-04 DIAGNOSIS — R269 Unspecified abnormalities of gait and mobility: Secondary | ICD-10-CM | POA: Insufficient documentation

## 2012-07-04 DIAGNOSIS — M25669 Stiffness of unspecified knee, not elsewhere classified: Secondary | ICD-10-CM | POA: Insufficient documentation

## 2012-07-04 DIAGNOSIS — R5381 Other malaise: Secondary | ICD-10-CM | POA: Insufficient documentation

## 2012-07-06 ENCOUNTER — Ambulatory Visit: Payer: Medicare Other | Admitting: *Deleted

## 2012-07-08 ENCOUNTER — Ambulatory Visit: Payer: Medicare Other | Admitting: Physical Therapy

## 2014-07-23 ENCOUNTER — Emergency Department (HOSPITAL_COMMUNITY): Payer: Medicare HMO

## 2014-07-23 ENCOUNTER — Inpatient Hospital Stay (HOSPITAL_COMMUNITY)
Admission: EM | Admit: 2014-07-23 | Discharge: 2014-07-25 | DRG: 699 | Disposition: A | Discharge status: Home / Self Care | Payer: Medicare HMO | Attending: Internal Medicine | Admitting: Internal Medicine

## 2014-07-23 ENCOUNTER — Inpatient Hospital Stay (HOSPITAL_COMMUNITY): Payer: Medicare HMO

## 2014-07-23 ENCOUNTER — Encounter (HOSPITAL_COMMUNITY): Payer: Self-pay | Admitting: Cardiology

## 2014-07-23 DIAGNOSIS — I739 Peripheral vascular disease, unspecified: Secondary | ICD-10-CM | POA: Diagnosis present

## 2014-07-23 DIAGNOSIS — Z96651 Presence of right artificial knee joint: Secondary | ICD-10-CM | POA: Diagnosis present

## 2014-07-23 DIAGNOSIS — Z8052 Family history of malignant neoplasm of bladder: Secondary | ICD-10-CM | POA: Diagnosis not present

## 2014-07-23 DIAGNOSIS — M13862 Other specified arthritis, left knee: Secondary | ICD-10-CM | POA: Diagnosis present

## 2014-07-23 DIAGNOSIS — Z794 Long term (current) use of insulin: Secondary | ICD-10-CM | POA: Diagnosis not present

## 2014-07-23 DIAGNOSIS — Z9049 Acquired absence of other specified parts of digestive tract: Secondary | ICD-10-CM | POA: Diagnosis present

## 2014-07-23 DIAGNOSIS — E039 Hypothyroidism, unspecified: Secondary | ICD-10-CM | POA: Diagnosis present

## 2014-07-23 DIAGNOSIS — I129 Hypertensive chronic kidney disease with stage 1 through stage 4 chronic kidney disease, or unspecified chronic kidney disease: Secondary | ICD-10-CM | POA: Diagnosis present

## 2014-07-23 DIAGNOSIS — E119 Type 2 diabetes mellitus without complications: Secondary | ICD-10-CM | POA: Diagnosis present

## 2014-07-23 DIAGNOSIS — Z87891 Personal history of nicotine dependence: Secondary | ICD-10-CM

## 2014-07-23 DIAGNOSIS — N3289 Other specified disorders of bladder: Principal | ICD-10-CM | POA: Insufficient documentation

## 2014-07-23 DIAGNOSIS — Z6841 Body Mass Index (BMI) 40.0 and over, adult: Secondary | ICD-10-CM | POA: Diagnosis not present

## 2014-07-23 DIAGNOSIS — R945 Abnormal results of liver function studies: Secondary | ICD-10-CM | POA: Diagnosis present

## 2014-07-23 DIAGNOSIS — M13861 Other specified arthritis, right knee: Secondary | ICD-10-CM | POA: Diagnosis present

## 2014-07-23 DIAGNOSIS — R748 Abnormal levels of other serum enzymes: Secondary | ICD-10-CM | POA: Diagnosis present

## 2014-07-23 DIAGNOSIS — R109 Unspecified abdominal pain: Secondary | ICD-10-CM | POA: Diagnosis present

## 2014-07-23 DIAGNOSIS — R590 Localized enlarged lymph nodes: Secondary | ICD-10-CM | POA: Diagnosis present

## 2014-07-23 DIAGNOSIS — E1122 Type 2 diabetes mellitus with diabetic chronic kidney disease: Secondary | ICD-10-CM

## 2014-07-23 DIAGNOSIS — N189 Chronic kidney disease, unspecified: Secondary | ICD-10-CM | POA: Diagnosis present

## 2014-07-23 DIAGNOSIS — N184 Chronic kidney disease, stage 4 (severe): Secondary | ICD-10-CM

## 2014-07-23 DIAGNOSIS — R7989 Other specified abnormal findings of blood chemistry: Secondary | ICD-10-CM | POA: Insufficient documentation

## 2014-07-23 DIAGNOSIS — N183 Chronic kidney disease, stage 3 (moderate): Secondary | ICD-10-CM

## 2014-07-23 DIAGNOSIS — I10 Essential (primary) hypertension: Secondary | ICD-10-CM | POA: Diagnosis present

## 2014-07-23 DIAGNOSIS — R1030 Lower abdominal pain, unspecified: Secondary | ICD-10-CM

## 2014-07-23 DIAGNOSIS — R935 Abnormal findings on diagnostic imaging of other abdominal regions, including retroperitoneum: Secondary | ICD-10-CM | POA: Insufficient documentation

## 2014-07-23 LAB — COMPREHENSIVE METABOLIC PANEL
ALT: 72 U/L — AB (ref 0–53)
ANION GAP: 17 — AB (ref 5–15)
AST: 114 U/L — ABNORMAL HIGH (ref 0–37)
Albumin: 3.8 g/dL (ref 3.5–5.2)
Alkaline Phosphatase: 211 U/L — ABNORMAL HIGH (ref 39–117)
BUN: 46 mg/dL — ABNORMAL HIGH (ref 6–23)
CALCIUM: 9.7 mg/dL (ref 8.4–10.5)
CO2: 26 mEq/L (ref 19–32)
CREATININE: 2.91 mg/dL — AB (ref 0.50–1.35)
Chloride: 94 mEq/L — ABNORMAL LOW (ref 96–112)
GFR calc Af Amer: 24 mL/min — ABNORMAL LOW (ref >90)
GFR calc non Af Amer: 21 mL/min — ABNORMAL LOW (ref >90)
Glucose, Bld: 219 mg/dL — ABNORMAL HIGH (ref 70–99)
Potassium: 3.8 mEq/L (ref 3.7–5.3)
Sodium: 137 mEq/L (ref 137–147)
TOTAL PROTEIN: 8.4 g/dL — AB (ref 6.0–8.3)
Total Bilirubin: 6.6 mg/dL — ABNORMAL HIGH (ref 0.3–1.2)

## 2014-07-23 LAB — URINALYSIS, ROUTINE W REFLEX MICROSCOPIC
Glucose, UA: 250 mg/dL — AB
Ketones, ur: NEGATIVE mg/dL
LEUKOCYTES UA: NEGATIVE
Nitrite: NEGATIVE
PROTEIN: 30 mg/dL — AB
Specific Gravity, Urine: 1.015 (ref 1.005–1.030)
Urobilinogen, UA: 4 mg/dL — ABNORMAL HIGH (ref 0.0–1.0)
pH: 6 (ref 5.0–8.0)

## 2014-07-23 LAB — CBC WITH DIFFERENTIAL/PLATELET
Basophils Absolute: 0 10*3/uL (ref 0.0–0.1)
Basophils Relative: 0 % (ref 0–1)
EOS PCT: 0 % (ref 0–5)
Eosinophils Absolute: 0 10*3/uL (ref 0.0–0.7)
HEMATOCRIT: 49.4 % (ref 39.0–52.0)
HEMOGLOBIN: 16.7 g/dL (ref 13.0–17.0)
LYMPHS PCT: 8 % — AB (ref 12–46)
Lymphs Abs: 0.9 10*3/uL (ref 0.7–4.0)
MCH: 29.5 pg (ref 26.0–34.0)
MCHC: 33.8 g/dL (ref 30.0–36.0)
MCV: 87.3 fL (ref 78.0–100.0)
MONO ABS: 0.6 10*3/uL (ref 0.1–1.0)
MONOS PCT: 5 % (ref 3–12)
Neutro Abs: 9.1 10*3/uL — ABNORMAL HIGH (ref 1.7–7.7)
Neutrophils Relative %: 87 % — ABNORMAL HIGH (ref 43–77)
Platelets: 132 10*3/uL — ABNORMAL LOW (ref 150–400)
RBC: 5.66 MIL/uL (ref 4.22–5.81)
RDW: 14.5 % (ref 11.5–15.5)
WBC: 10.5 10*3/uL (ref 4.0–10.5)

## 2014-07-23 LAB — URINE MICROSCOPIC-ADD ON

## 2014-07-23 LAB — TROPONIN I: Troponin I: <0.3 ng/mL (ref <0.30)

## 2014-07-23 LAB — I-STAT CG4 LACTIC ACID, ED: Lactic Acid, Venous: 2.33 mmol/L — ABNORMAL HIGH (ref 0.5–2.2)

## 2014-07-23 LAB — PRO B NATRIURETIC PEPTIDE: Pro B Natriuretic peptide (BNP): 299.5 pg/mL — ABNORMAL HIGH (ref 0–125)

## 2014-07-23 LAB — GLUCOSE, CAPILLARY: Glucose-Capillary: 204 mg/dL — ABNORMAL HIGH (ref 70–99)

## 2014-07-23 LAB — LIPASE, BLOOD: Lipase: 44 U/L (ref 11–59)

## 2014-07-23 MED ORDER — LEVOTHYROXINE SODIUM 100 MCG PO TABS
300.0000 ug | ORAL_TABLET | Freq: Every day | ORAL | Status: DC
  Administered 2014-07-24 – 2014-07-25 (×2): 300 ug via ORAL
  Filled 2014-07-23 (×2): qty 3
  Filled 2014-07-23 (×2): qty 2

## 2014-07-23 MED ORDER — LOSARTAN POTASSIUM 50 MG PO TABS
100.0000 mg | ORAL_TABLET | Freq: Every day | ORAL | Status: DC
  Administered 2014-07-23 – 2014-07-25 (×3): 100 mg via ORAL
  Filled 2014-07-23 (×6): qty 2

## 2014-07-23 MED ORDER — ZOLPIDEM TARTRATE 5 MG PO TABS
5.0000 mg | ORAL_TABLET | Freq: Once | ORAL | Status: AC
  Administered 2014-07-23: 5 mg via ORAL
  Filled 2014-07-23: qty 1

## 2014-07-23 MED ORDER — IOHEXOL 300 MG/ML  SOLN
50.0000 mL | Freq: Once | INTRAMUSCULAR | Status: AC | PRN
  Administered 2014-07-23: 50 mL via ORAL

## 2014-07-23 MED ORDER — ENOXAPARIN SODIUM 40 MG/0.4ML ~~LOC~~ SOLN
40.0000 mg | SUBCUTANEOUS | Status: DC
  Administered 2014-07-23 – 2014-07-24 (×2): 40 mg via SUBCUTANEOUS
  Filled 2014-07-23 (×2): qty 0.4

## 2014-07-23 MED ORDER — INSULIN ASPART 100 UNIT/ML ~~LOC~~ SOLN
0.0000 [IU] | Freq: Three times a day (TID) | SUBCUTANEOUS | Status: DC
  Administered 2014-07-24 – 2014-07-25 (×3): 2 [IU] via SUBCUTANEOUS

## 2014-07-23 MED ORDER — ONDANSETRON HCL 4 MG/2ML IJ SOLN: 4.0000 mg | Freq: Four times a day (QID) | INTRAMUSCULAR | Status: DC | PRN

## 2014-07-23 MED ORDER — SODIUM CHLORIDE 0.9 % IV SOLN
INTRAVENOUS | Status: DC
  Administered 2014-07-23 – 2014-07-25 (×3): via INTRAVENOUS

## 2014-07-23 MED ORDER — ENOXAPARIN SODIUM 30 MG/0.3ML ~~LOC~~ SOLN: 30.0000 mg | SUBCUTANEOUS | Status: DC

## 2014-07-23 MED ORDER — INSULIN GLARGINE 100 UNIT/ML ~~LOC~~ SOLN
50.0000 [IU] | Freq: Every day | SUBCUTANEOUS | Status: DC
  Filled 2014-07-23 (×2): qty 0.5

## 2014-07-23 MED ORDER — VERAPAMIL HCL ER 180 MG PO TBCR
180.0000 mg | EXTENDED_RELEASE_TABLET | Freq: Every day | ORAL | Status: DC
  Administered 2014-07-24 – 2014-07-25 (×2): 180 mg via ORAL
  Filled 2014-07-23 (×4): qty 1

## 2014-07-23 MED ORDER — ONDANSETRON HCL 4 MG PO TABS: 4.0000 mg | ORAL_TABLET | Freq: Four times a day (QID) | ORAL | Status: DC | PRN

## 2014-07-23 MED ORDER — INSULIN ASPART 100 UNIT/ML ~~LOC~~ SOLN
0.0000 [IU] | Freq: Every day | SUBCUTANEOUS | Status: DC
  Administered 2014-07-23: 2 [IU] via SUBCUTANEOUS

## 2014-07-23 NOTE — ED Notes (Signed)
Gave patient ice water as requested and approved by MD. 

## 2014-07-23 NOTE — H&P (Signed)
[facility] History and Physical  [name] [name] [id number] DOB: [birth year] DOA: 07/23/2014  Referring physician: ER PCP: Lynne Logan, MD   Chief Complaint: Abdominal pain  HPI: [name] [name] is a 68 y.o. male  This is a 68 year old man who gives a four-day history of central and lower abdominal pain. He rates the pain as severe and constant. It has been associated with several episodes of vomiting, mainly fluid. There is no hematemesis. There is no fever. He apparently vomited 83 days ago. Today he vomited once. There is no diarrhea or rectal bleeding. He did have bowel motions and he tells me that his stools are somewhat yellow in color. He does not describe melena. He has been feeling warm but there is no documented fever.   Review of Systems:  Apart from symptoms mentioned above, all other systems negative.  Past Medical History  Diagnosis Date  . Peripheral vascular disease     neuropathy in toes   . Shortness of breath     due ot pain in knees   . Sleep apnea     sleep study 3/29 13 ? location   . Pneumonia     hx of   . Diabetes mellitus   . Hypothyroidism   . Chronic kidney disease     stage III kidney disease   . Arthritis     knees, back    Past Surgical History  Procedure Laterality Date  . Cholecystectomy    . Total knee arthroplasty  11/10/2011    Procedure: TOTAL KNEE ARTHROPLASTY;  Surgeon: Mauri Pole, MD;  Location: [facility];  Service: Orthopedics;  Laterality: Right;   Social History:  reports that he quit smoking about 6 years ago. He has never used smokeless tobacco. He reports that he does not drink alcohol or use illicit drugs.  No Known Allergies  Family history: His father did have bladder cancer.   Prior to Admission medications   Medication Sig Start Date End Date Taking? Authorizing Provider  Insulin Glargine (LANTUS SOLOSTAR) 100 UNIT/ML Solostar Pen Inject 51 Units into the skin daily.   Yes Historical Provider, MD  insulin  lispro (HUMALOG KWIKPEN) 100 UNIT/ML KiwkPen Inject 3-7 Units into the skin 3 (three) times daily.   Yes Historical Provider, MD  levothyroxine (SYNTHROID, LEVOTHROID) 300 MCG tablet Take 300 mcg by mouth every morning.   Yes Historical Provider, MD  losartan (COZAAR) 100 MG tablet Take 100 mg by mouth every morning.   Yes Historical Provider, MD  verapamil (CALAN-SR) 180 MG CR tablet Take 180 mg by mouth daily.   Yes Historical Provider, MD  ferrous sulfate 325 (65 FE) MG tablet Take 1 tablet (325 mg total) by mouth 3 (three) times daily after meals. Patient not taking: Reported on 07/23/2014 11/13/11 11/12/12  Pricilla Loveless, PA-C   Physical Exam: Filed Vitals:   07/23/14 1251 07/23/14 1746  BP: 168/100 131/73  Pulse: 73 90  Temp: 98 F (36.7 C)   TempSrc: Oral   Resp: 18 20  Height: 6\' 1"  (1.854 m)   Weight: 140.615 kg (310 lb)   SpO2: 97% 95%    Wt Readings from Last 3 Encounters:  07/23/14 140.615 kg (310 lb)  11/10/11 162.388 kg (358 lb)  10/28/11 162.388 kg (358 lb)    General:  Appears jaundiced. Morbidly obese. Eyes: PERRL, normal lids, irises & conjunctiva ENT: grossly normal hearing, lips & tongue Neck: no LAD, masses or thyromegaly Cardiovascular: RRR, no m/r/g. No LE edema. Telemetry:  SR, no arrhythmias  Respiratory: CTA bilaterally, no w/r/r. Normal respiratory effort. Abdomen: Cholecystectomy scar. No significant abdominal tenderness. Due to his obesity, difficulty to feel  for any specific masses. Skin: no rash or induration seen on limited exam Musculoskeletal: grossly normal tone BUE/BLE Psychiatric: grossly normal mood and affect, speech fluent and appropriate Neurologic: grossly non-focal.          Labs on Admission:  Basic Metabolic Panel:  Recent Labs Lab 07/23/14 1245  NA 137  K 3.8  CL 94*  CO2 26  GLUCOSE 219*  BUN 46*  CREATININE 2.91*  CALCIUM 9.7   Liver Function Tests:  Recent Labs Lab 07/23/14 1245  AST 114*  ALT 72*    ALKPHOS 211*  BILITOT 6.6*  PROT 8.4*  ALBUMIN 3.8    Recent Labs Lab 07/23/14 1245  LIPASE 44   No results for input(s): AMMONIA in the last 168 hours. CBC:  Recent Labs Lab 07/23/14 1245  WBC 10.5  NEUTROABS 9.1*  HGB 16.7  HCT 49.4  MCV 87.3  PLT 132*   Cardiac Enzymes:  Recent Labs Lab 07/23/14 1245  TROPONINI <0.30    BNP (last 3 results) No results for input(s): PROBNP in the last 8760 hours. CBG: No results for input(s): GLUCAP in the last 168 hours.  Radiological Exams on Admission: Ct Abdomen Pelvis Wo Contrast  07/23/2014   CLINICAL DATA:  Upper abdominal pain for 3 days  EXAM: CT ABDOMEN AND PELVIS WITHOUT CONTRAST  TECHNIQUE: Multidetector CT imaging of the abdomen and pelvis was performed following the standard protocol without IV contrast.  COMPARISON:  Plain film from earlier in the same day  FINDINGS: Lung bases are free of acute infiltrate or sizable effusion. The abnormality seen on recent chest x-ray is not borne out on the CT.  The liver, spleen, adrenal glands and pancreas are within normal limits. The gallbladder has been surgically removed. The kidneys show no obstructive changes. \  The appendix is well visualized and within normal limits. Mild diverticular change is seen without evidence of diverticulitis. The bladder is partially distended.  Adjacent to the bladder extending superiorly into the left there is a 8.0 x 6.6 cm soft tissue lesion identified. It is intimately opposed to the bladder wall and may represent a large bladder mass. Further evaluation is recommended. Additionally small lymph nodes are noted in the periaortic region. The largest of these is on image number 59 of series 2 measuring 14 mm in dimension. Additionally some iliac chain lymphadenopathy is noted best seen on image number 72 and 70 of series 2 these measure approximately 12-13 mm in short axis. Degenerative changes of the lumbar spine are seen. A a soft tissue density is  noted adjacent to the distal esophagus on the first image but incompletely evaluated. CT of the chest may be helpful for further evaluation.  IMPRESSION: Soft tissue mass intimately opposed to the superior and left wall of the urinary bladder. It is uncertain whether this arises from the bladder but felt to be most likely. Periaortic and iliac chain lymph nodes are noted. Further evaluation is recommended.  The abnormality seen on recent plain film in the chest is not well visualized on this exam.  Likely lymph node adjacent to the distal esophagus. This is incompletely evaluated as it is only seen on the first image. CT of the chest may be helpful for further evaluation.  Although not mentioned in the body of the report, In the upper abdominal cavity  just below the xiphoid process, there is a partially calcified soft tissue lesion. This is of uncertain significance. The possibility of a peritoneal lesion cannot be totally excluded.   Electronically Signed   By: Inez Catalina M.D.   On: 07/23/2014 17:36   Dg Abd Acute W/chest  07/23/2014   CLINICAL DATA:  Vomiting and abdominal pain  EXAM: ACUTE ABDOMEN SERIES (ABDOMEN 2 VIEW & CHEST 1 VIEW)  COMPARISON:  Radiograph 10/28/2011  FINDINGS: Cardiac silhouette is enlarged. There is increased density over the right cardiophrenic angle. No free air beneath the hemidiaphragms.  No dilated large or small bowel. Gas in the rectum. No organomegaly. No pathologic calcifications. Prior cholecystectomy.  IMPRESSION: 1. Increased density at the right cardiophrenic angle. Cannot exclude pneumonia or aspiration pneumonitis. 2. No evidence of bowel obstruction.   Electronically Signed   By: Suzy Bouchard M.D.   On: 07/23/2014 14:30      Assessment/Plan   1. Abdominal pain-etiology is not entirely clear but CT scan of the abdomen is suggestive of a mass closely associated with the bladder. He may have malignancy. There is also lymphadenopathy near the distal esophagus.  Liver enzymes are abnormal with a hepatitic picture of elevated transaminases. He also has elevated bilirubin. CT scan of the abdomen does not show biliary duct obstruction/dilatation. I will order a hepatitis panel. We will get ultrasound of the abdomen looking at the biliary system. I will ask gastroenterology to see him. 2. Lymphadenopathy associated with esophagus-will obtain a CT scan of the chest for further evaluation. 3. Chronic kidney disease-his creatinine appears to be around his baseline. 4. Diabetes mellitus-we will monitor and continue home medications with a sliding scale insulin. 5. Morbid obesity.  Further recommendations will depend on patient's hospital progress.   Code Status: Full code  DVT Prophylaxis: Lovenox.  Family Communication: I discussed the plan with the patient at the bedside.   Disposition Plan: Home when medically stable.   Time spent: 60 minutes.  Doree Albee [facility] Pager [phone].

## 2014-07-23 NOTE — ED Provider Notes (Signed)
CSN: [id number]     Arrival date & time 07/23/14  1237 History   First MD Initiated Contact with Patient 07/23/14 1302     Chief Complaint  Patient presents with  . Emesis     (Consider location/radiation/quality/duration/timing/severity/associated sxs/prior Treatment) HPI Comments: Complains of diffuse abdominal pain. First started 3 days ago, vomited about 8 times and then symptoms resolved. Started again today - constant diffuse pain with nausea. Vomited one time today. No diarrhea, had a BM today.  Patient is a 68 y.o. male presenting with vomiting.  Emesis Associated symptoms: abdominal pain     Past Medical History  Diagnosis Date  . Peripheral vascular disease     neuropathy in toes   . Shortness of breath     due ot pain in knees   . Sleep apnea     sleep study 3/29 13 ? location   . Pneumonia     hx of   . Diabetes mellitus   . Hypothyroidism   . Chronic kidney disease     stage III kidney disease   . Arthritis     knees, back    Past Surgical History  Procedure Laterality Date  . Cholecystectomy    . Total knee arthroplasty  11/10/2011    Procedure: TOTAL KNEE ARTHROPLASTY;  Surgeon: Mauri Pole, MD;  Location: [facility];  Service: Orthopedics;  Laterality: Right;   History reviewed. No pertinent family history. History  Substance Use Topics  . Smoking status: Former Smoker    Quit date: 08/04/2007  . Smokeless tobacco: Never Used  . Alcohol Use: No    Review of Systems  Gastrointestinal: Positive for nausea, vomiting and abdominal pain.  All other systems reviewed and are negative.     Allergies  Review of patient's allergies indicates no known allergies.  Home Medications   Prior to Admission medications   Medication Sig Start Date End Date Taking? Authorizing Provider  Insulin Glargine (LANTUS SOLOSTAR) 100 UNIT/ML Solostar Pen Inject 51 Units into the skin daily.   Yes Historical Provider, MD  insulin lispro (HUMALOG KWIKPEN) 100 UNIT/ML  KiwkPen Inject 3-7 Units into the skin 3 (three) times daily.   Yes Historical Provider, MD  levothyroxine (SYNTHROID, LEVOTHROID) 300 MCG tablet Take 300 mcg by mouth every morning.   Yes Historical Provider, MD  losartan (COZAAR) 100 MG tablet Take 100 mg by mouth every morning.   Yes Historical Provider, MD  verapamil (CALAN-SR) 180 MG CR tablet Take 180 mg by mouth daily.   Yes Historical Provider, MD  ferrous sulfate 325 (65 FE) MG tablet Take 1 tablet (325 mg total) by mouth 3 (three) times daily after meals. Patient not taking: Reported on 07/23/2014 11/13/11 11/12/12  Lucille Passy Babish, PA-C   BP 131/73 mmHg  Pulse 90  Temp(Src) 98 F (36.7 C) (Oral)  Resp 20  Ht 6\' 1"  (1.854 m)  Wt 310 lb (140.615 kg)  BMI 40.91 kg/m2  SpO2 95% Physical Exam  Constitutional: He is oriented to person, place, and time. He appears well-developed and well-nourished. No distress.  HENT:  Head: Normocephalic and atraumatic.  Right Ear: Hearing normal.  Left Ear: Hearing normal.  Nose: Nose normal.  Mouth/Throat: Oropharynx is clear and moist and mucous membranes are normal.  Eyes: Conjunctivae and EOM are normal. Pupils are equal, round, and reactive to light.  Neck: Normal range of motion. Neck supple.  Cardiovascular: Regular rhythm, S1 normal and S2 normal.  Exam reveals no gallop and  no friction rub.   No murmur heard. Pulmonary/Chest: Effort normal and breath sounds normal. No respiratory distress. He exhibits no tenderness.  Abdominal: Soft. Normal appearance. He exhibits distension (slightly). Bowel sounds are decreased. There is no hepatosplenomegaly. There is no tenderness. There is no rebound, no guarding, no tenderness at McBurney's point and negative Murphy's sign. No hernia.  Musculoskeletal: Normal range of motion.  Neurological: He is alert and oriented to person, place, and time. He has normal strength. No cranial nerve deficit or sensory deficit. Coordination normal. GCS eye  subscore is 4. GCS verbal subscore is 5. GCS motor subscore is 6.  Skin: Skin is warm, dry and intact. No rash noted. No cyanosis.  Psychiatric: He has a normal mood and affect. His speech is normal and behavior is normal. Thought content normal.  Nursing note and vitals reviewed.   ED Course  Procedures (including critical care time) Labs Review Labs Reviewed  CBC WITH DIFFERENTIAL - Abnormal; Notable for the following:    Platelets 132 (*)    Neutrophils Relative % 87 (*)    Neutro Abs 9.1 (*)    Lymphocytes Relative 8 (*)    All other components within normal limits  COMPREHENSIVE METABOLIC PANEL - Abnormal; Notable for the following:    Chloride 94 (*)    Glucose, Bld 219 (*)    BUN 46 (*)    Creatinine, Ser 2.91 (*)    Total Protein 8.4 (*)    AST 114 (*)    ALT 72 (*)    Alkaline Phosphatase 211 (*)    Total Bilirubin 6.6 (*)    GFR calc non Af Amer 21 (*)    GFR calc Af Amer 24 (*)    Anion gap 17 (*)    All other components within normal limits  URINALYSIS, ROUTINE W REFLEX MICROSCOPIC - Abnormal; Notable for the following:    Glucose, UA 250 (*)    Hgb urine dipstick MODERATE (*)    Bilirubin Urine MODERATE (*)    Protein, ur 30 (*)    Urobilinogen, UA 4.0 (*)    All other components within normal limits  URINE MICROSCOPIC-ADD ON - Abnormal; Notable for the following:    Squamous Epithelial / LPF FEW (*)    Bacteria, UA FEW (*)    Casts GRANULAR CAST (*)    All other components within normal limits  I-STAT CG4 LACTIC ACID, ED - Abnormal; Notable for the following:    Lactic Acid, Venous 2.33 (*)    All other components within normal limits  LIPASE, BLOOD  TROPONIN I    Imaging Review Ct Abdomen Pelvis Wo Contrast  07/23/2014   CLINICAL DATA:  Upper abdominal pain for 3 days  EXAM: CT ABDOMEN AND PELVIS WITHOUT CONTRAST  TECHNIQUE: Multidetector CT imaging of the abdomen and pelvis was performed following the standard protocol without IV contrast.   COMPARISON:  Plain film from earlier in the same day  FINDINGS: Lung bases are free of acute infiltrate or sizable effusion. The abnormality seen on recent chest x-ray is not borne out on the CT.  The liver, spleen, adrenal glands and pancreas are within normal limits. The gallbladder has been surgically removed. The kidneys show no obstructive changes. \  The appendix is well visualized and within normal limits. Mild diverticular change is seen without evidence of diverticulitis. The bladder is partially distended.  Adjacent to the bladder extending superiorly into the left there is a 8.0 x 6.6 cm soft  tissue lesion identified. It is intimately opposed to the bladder wall and may represent a large bladder mass. Further evaluation is recommended. Additionally small lymph nodes are noted in the periaortic region. The largest of these is on image number 59 of series 2 measuring 14 mm in dimension. Additionally some iliac chain lymphadenopathy is noted best seen on image number 72 and 70 of series 2 these measure approximately 12-13 mm in short axis. Degenerative changes of the lumbar spine are seen. A a soft tissue density is noted adjacent to the distal esophagus on the first image but incompletely evaluated. CT of the chest may be helpful for further evaluation.  IMPRESSION: Soft tissue mass intimately opposed to the superior and left wall of the urinary bladder. It is uncertain whether this arises from the bladder but felt to be most likely. Periaortic and iliac chain lymph nodes are noted. Further evaluation is recommended.  The abnormality seen on recent plain film in the chest is not well visualized on this exam.  Likely lymph node adjacent to the distal esophagus. This is incompletely evaluated as it is only seen on the first image. CT of the chest may be helpful for further evaluation.  Although not mentioned in the body of the report, In the upper abdominal cavity just below the xiphoid process, there is a  partially calcified soft tissue lesion. This is of uncertain significance. The possibility of a peritoneal lesion cannot be totally excluded.   Electronically Signed   By: Inez Catalina M.D.   On: 07/23/2014 17:36   Dg Abd Acute W/chest  07/23/2014   CLINICAL DATA:  Vomiting and abdominal pain  EXAM: ACUTE ABDOMEN SERIES (ABDOMEN 2 VIEW & CHEST 1 VIEW)  COMPARISON:  Radiograph 10/28/2011  FINDINGS: Cardiac silhouette is enlarged. There is increased density over the right cardiophrenic angle. No free air beneath the hemidiaphragms.  No dilated large or small bowel. Gas in the rectum. No organomegaly. No pathologic calcifications. Prior cholecystectomy.  IMPRESSION: 1. Increased density at the right cardiophrenic angle. Cannot exclude pneumonia or aspiration pneumonitis. 2. No evidence of bowel obstruction.   Electronically Signed   By: Suzy Bouchard M.D.   On: 07/23/2014 14:30     EKG Interpretation   Date/Time:  Monday July 23 2014 12:47:13 EST Ventricular Rate:  72 PR Interval:  238 QRS Duration: 136 QT Interval:  408 QTC Calculation: 446 R Axis:   -61 Text Interpretation:  Sinus rhythm Ventricular premature complex Prolonged  PR interval Nonspecific IVCD with LAD Anterolateral infarct, age  indeterminate Confirmed by POLLINA  MD, Roslyn [phone]) on 07/23/2014  1:10:47 PM      MDM   Final diagnoses:  Abdominal pain  Elevated LFTs  Bladder mass  Lymphadenopathy, abdominal    Patient presents to the ER for evaluation of abdominal pain with nausea and vomiting. Symptoms have been intermittent for the last 3 days. He had onset 3 days ago of severe pain with nausea and vomiting which resolved after approximately a day. Symptoms began again today. Patient indicates diffuse midline pain. He has not had any vomiting since this morning, however. There is no fever. Patient's CBC is normal, no anemia, no leukocytosis. He had very slight lactic acidosis at 2.33. Complex metabolic  panel did show elevated AST (114), ALT(72), alkaline phosphatase (211) and total bilirubin (6.6). Patient has had previous cholecystectomy. Because of this, CT scan was performed to further evaluate for the patient's symptoms. No abnormalities are seen in the area of the  liver. He does, however, have a soft tissue mass in the area of the urinary bladder. There is also diffuse lymphadenopathy noted. This is concerning for bladder cancer. I discussed this with the patient and he does inform me that his father had bladder cancer. Patient does have a previous history of renal insufficiency, BUN and creatinine appear to be near his baseline. No other joint abnormalities. Based on his liver function abnormalities, however, I believe he requires further workup inpatient at least initially.      Orpah Greek, MD 07/23/14 [phone]

## 2014-07-23 NOTE — ED Notes (Addendum)
Vomiting and abdominal pain  times 3 days.

## 2014-07-24 ENCOUNTER — Inpatient Hospital Stay (HOSPITAL_COMMUNITY): Payer: Medicare HMO

## 2014-07-24 ENCOUNTER — Encounter (HOSPITAL_COMMUNITY): Payer: Self-pay | Admitting: Gastroenterology

## 2014-07-24 DIAGNOSIS — N184 Chronic kidney disease, stage 4 (severe): Secondary | ICD-10-CM

## 2014-07-24 DIAGNOSIS — R109 Unspecified abdominal pain: Secondary | ICD-10-CM

## 2014-07-24 DIAGNOSIS — R7989 Other specified abnormal findings of blood chemistry: Secondary | ICD-10-CM

## 2014-07-24 DIAGNOSIS — D494 Neoplasm of unspecified behavior of bladder: Secondary | ICD-10-CM

## 2014-07-24 DIAGNOSIS — R945 Abnormal results of liver function studies: Secondary | ICD-10-CM

## 2014-07-24 DIAGNOSIS — E118 Type 2 diabetes mellitus with unspecified complications: Secondary | ICD-10-CM

## 2014-07-24 LAB — COMPREHENSIVE METABOLIC PANEL
ALK PHOS: 159 U/L — AB (ref 39–117)
ALT: 57 U/L — ABNORMAL HIGH (ref 0–53)
AST: 58 U/L — AB (ref 0–37)
Albumin: 3.2 g/dL — ABNORMAL LOW (ref 3.5–5.2)
Anion gap: 9 (ref 5–15)
BILIRUBIN TOTAL: 8.5 mg/dL — AB (ref 0.3–1.2)
BUN: 46 mg/dL — ABNORMAL HIGH (ref 6–23)
CHLORIDE: 100 meq/L (ref 96–112)
CO2: 25 mmol/L (ref 19–32)
CREATININE: 2.72 mg/dL — AB (ref 0.50–1.35)
Calcium: 8.5 mg/dL (ref 8.4–10.5)
GFR calc Af Amer: 26 mL/min — ABNORMAL LOW (ref >90)
GFR calc non Af Amer: 23 mL/min — ABNORMAL LOW (ref >90)
Glucose, Bld: 147 mg/dL — ABNORMAL HIGH (ref 70–99)
Potassium: 2.9 mmol/L — ABNORMAL LOW (ref 3.5–5.1)
Sodium: 134 mmol/L — ABNORMAL LOW (ref 135–145)
Total Protein: 7.2 g/dL (ref 6.0–8.3)

## 2014-07-24 LAB — CBC
HEMATOCRIT: 42.7 % (ref 39.0–52.0)
Hemoglobin: 14.2 g/dL (ref 13.0–17.0)
MCH: 28.8 pg (ref 26.0–34.0)
MCHC: 33.3 g/dL (ref 30.0–36.0)
MCV: 86.6 fL (ref 78.0–100.0)
PLATELETS: 121 10*3/uL — AB (ref 150–400)
RBC: 4.93 MIL/uL (ref 4.22–5.81)
RDW: 14.8 % (ref 11.5–15.5)
WBC: 7.4 10*3/uL (ref 4.0–10.5)

## 2014-07-24 LAB — GLUCOSE, CAPILLARY
GLUCOSE-CAPILLARY: 125 mg/dL — AB (ref 70–99)
GLUCOSE-CAPILLARY: 117 mg/dL — AB (ref 70–99)
Glucose-Capillary: 149 mg/dL — ABNORMAL HIGH (ref 70–99)
Glucose-Capillary: 75 mg/dL (ref 70–99)

## 2014-07-24 LAB — BILIRUBIN, FRACTIONATED(TOT/DIR/INDIR)
Bilirubin, Direct: 5.7 mg/dL — ABNORMAL HIGH (ref 0.0–0.3)
Indirect Bilirubin: 2.7 mg/dL — ABNORMAL HIGH (ref 0.3–0.9)
Total Bilirubin: 8.4 mg/dL — ABNORMAL HIGH (ref 0.3–1.2)

## 2014-07-24 LAB — TSH: TSH: 0.538 u[IU]/mL (ref 0.350–4.500)

## 2014-07-24 LAB — PROTIME-INR
INR: 1.1 (ref 0.00–1.49)
Prothrombin Time: 14.3 seconds (ref 11.6–15.2)

## 2014-07-24 MED ORDER — PANTOPRAZOLE SODIUM 40 MG PO TBEC
40.0000 mg | DELAYED_RELEASE_TABLET | Freq: Every day | ORAL | Status: DC
  Administered 2014-07-25: 40 mg via ORAL
  Filled 2014-07-24: qty 1

## 2014-07-24 MED ORDER — INSULIN GLARGINE 100 UNIT/ML ~~LOC~~ SOLN
30.0000 [IU] | Freq: Every day | SUBCUTANEOUS | Status: DC
  Administered 2014-07-24: 30 [IU] via SUBCUTANEOUS
  Filled 2014-07-24 (×7): qty 0.3

## 2014-07-24 MED ORDER — PANTOPRAZOLE SODIUM 40 MG IV SOLR
40.0000 mg | INTRAVENOUS | Status: DC
  Administered 2014-07-24: 40 mg via INTRAVENOUS
  Filled 2014-07-24: qty 40

## 2014-07-24 MED ORDER — PANTOPRAZOLE SODIUM 40 MG PO TBEC
40.0000 mg | DELAYED_RELEASE_TABLET | Freq: Every day | ORAL | Status: DC
  Filled 2014-07-24: qty 1

## 2014-07-24 NOTE — Progress Notes (Signed)
[facility] PROGRESS NOTE  [name] [name] [id number] DOB: [birth year] DOA: 07/23/2014 PCP: Lynne Logan, MD  Assessment/Plan: 1. Abdominal pain. Etiology is not clear. Appreciate GI input. CT scan did not illustrate any clear source of abdominal pain. Patient is status post cholecystectomy. Plans are for abdominal ultrasound today. May ultimately need MRCP. 2. Elevated liver enzymes. Hepatitis panels currently pending. Question underlying chronic liver disease. Hyperbilirubinemia is mostly direct 3. Bladder lesion. On CT scan of the abdomen and pelvis, there was noted to be a soft tissue mass posterior superior left wall of the urinary bladder. Urology consultation requested for further evaluation if needed. 4. Insulin-dependent diabetes. Continue on Lantus and sliding scale insulin. 5. Chronic kidney disease stage IV. Creatinine is currently at baseline. 6. Hypokalemia. Replace 7. Hypothyroidism. On replacement therapy.  Code Status: full code Family Communication: discussed with family at the bedside Disposition Plan: discharge home once improved   Consultants:  Gastroenterology  Urology  Procedures:    Antibiotics:    HPI/Subjective: Feels abdominal pain has improved, no nausea or vomiting  Objective: Filed Vitals:   07/24/14 1509  BP: 111/65  Pulse: 50  Temp: 97.8 F (36.6 C)  Resp: 15    Intake/Output Summary (Last 24 hours) at 07/24/14 1518 Last data filed at 07/24/14 1400  Gross per 24 hour  Intake    240 ml  Output    300 ml  Net    -60 ml   Filed Weights   07/23/14 1251 07/23/14 2019  Weight: 140.615 kg (310 lb) 144.1 kg (317 lb 10.9 oz)    Exam:   General:  NAD  Cardiovascular: S1, S2 RRR  Respiratory: cta b  Abdomen: obese, nt, bs+  Musculoskeletal: no edema b/l   Data Reviewed: Basic Metabolic Panel:  Recent Labs Lab 07/23/14 1245 07/24/14 0546  NA 137 134*  K 3.8 2.9*  CL 94* 100  CO2 26 25  GLUCOSE 219* 147*   BUN 46* 46*  CREATININE 2.91* 2.72*  CALCIUM 9.7 8.5   Liver Function Tests:  Recent Labs Lab 07/23/14 1245 07/24/14 0546  AST 114* 58*  ALT 72* 57*  ALKPHOS 211* 159*  BILITOT 6.6* 8.4*  8.5*  PROT 8.4* 7.2  ALBUMIN 3.8 3.2*    Recent Labs Lab 07/23/14 1245  LIPASE 44   No results for input(s): AMMONIA in the last 168 hours. CBC:  Recent Labs Lab 07/23/14 1245 07/24/14 0546  WBC 10.5 7.4  NEUTROABS 9.1*  --   HGB 16.7 14.2  HCT 49.4 42.7  MCV 87.3 86.6  PLT 132* 121*   Cardiac Enzymes:  Recent Labs Lab 07/23/14 1245  TROPONINI <0.30   BNP (last 3 results)  Recent Labs  07/23/14 1245  PROBNP 299.5*   CBG:  Recent Labs Lab 07/23/14 2236 07/24/14 0739 07/24/14 1157  GLUCAP 204* 149* 125*    No results found for this or any previous visit (from the past 240 hour(s)).   Studies: Ct Abdomen Pelvis Wo Contrast  07/23/2014   CLINICAL DATA:  Upper abdominal pain for 3 days  EXAM: CT ABDOMEN AND PELVIS WITHOUT CONTRAST  TECHNIQUE: Multidetector CT imaging of the abdomen and pelvis was performed following the standard protocol without IV contrast.  COMPARISON:  Plain film from earlier in the same day  FINDINGS: Lung bases are free of acute infiltrate or sizable effusion. The abnormality seen on recent chest x-ray is not borne out on the CT.  The liver, spleen, adrenal glands and pancreas are within normal  limits. The gallbladder has been surgically removed. The kidneys show no obstructive changes. \  The appendix is well visualized and within normal limits. Mild diverticular change is seen without evidence of diverticulitis. The bladder is partially distended.  Adjacent to the bladder extending superiorly into the left there is a 8.0 x 6.6 cm soft tissue lesion identified. It is intimately opposed to the bladder wall and may represent a large bladder mass. Further evaluation is recommended. Additionally small lymph nodes are noted in the periaortic region.  The largest of these is on image number 59 of series 2 measuring 14 mm in dimension. Additionally some iliac chain lymphadenopathy is noted best seen on image number 72 and 70 of series 2 these measure approximately 12-13 mm in short axis. Degenerative changes of the lumbar spine are seen. A a soft tissue density is noted adjacent to the distal esophagus on the first image but incompletely evaluated. CT of the chest may be helpful for further evaluation.  IMPRESSION: Soft tissue mass intimately opposed to the superior and left wall of the urinary bladder. It is uncertain whether this arises from the bladder but felt to be most likely. Periaortic and iliac chain lymph nodes are noted. Further evaluation is recommended.  The abnormality seen on recent plain film in the chest is not well visualized on this exam.  Likely lymph node adjacent to the distal esophagus. This is incompletely evaluated as it is only seen on the first image. CT of the chest may be helpful for further evaluation.  Although not mentioned in the body of the report, In the upper abdominal cavity just below the xiphoid process, there is a partially calcified soft tissue lesion. This is of uncertain significance. The possibility of a peritoneal lesion cannot be totally excluded.   Electronically Signed   By: Inez Catalina M.D.   On: 07/23/2014 17:36   Ct Chest Wo Contrast  07/23/2014   CLINICAL DATA:  Upper abdominal pain, possible periesophageal lymph node at incomplete imaging of the lung bases on CT abdomen pelvis performed earlier today.  EXAM: CT CHEST WITHOUT CONTRAST  TECHNIQUE: Multidetector CT imaging of the chest was performed following the standard protocol without IV contrast.  COMPARISON:  CT abdomen/ pelvis performed earlier today, dictated separately  FINDINGS: Minimal patchy 1-2 mm bilateral upper lobe pulmonary nodules are identified with a possible tree-in-bud type configuration suggesting small airways infection. No focal lobar  opacity or mass. No pleural or pericardial effusion.  No acute osseous abnormality. Findings referable to the upper abdomen have been dictated under a separate report earlier today. Confluent subcarinal lymph node measures 1.5 cm image 27. Representative pretracheal node measures 0.9 cm image 21. AP window lymph nodes measuring 0.6 cm in short axis diameter and smaller are identified, representative node image 17. No axillary lymphadenopathy. Allowing for lack of contrast, no hilar lymphadenopathy.  IMPRESSION: Mild subcarinal lymphadenopathy. This is amenable to further evaluation at presumed global staging at PET-CT performed on a nonemergent outpatient basis, pending on results of presumed pending cystoscopy for a previously reported bladder mass. This could be reactive although metastatic lymphadenopathy could appear similar.  Patchy areas of pulmonary parenchymal nodularity with a vague tree-in-bud type appearance most typical for small airways infection. This is also amenable to followup at presumed future restaging studies.   Electronically Signed   By: Conchita Paris M.D.   On: 07/23/2014 19:52   US Abdomen Complete  07/24/2014   CLINICAL DATA:  Abdominal pain and elevated  liver enzymes  EXAM: ULTRASOUND ABDOMEN COMPLETE  COMPARISON:  CT abdomen and pelvis July 23, 2014  FINDINGS: Gallbladder: Surgically absent.  Common bile duct: Diameter: 5 mm. There is no intrahepatic, common hepatic, or common bile duct dilatation.  Liver: No focal lesion identified. Liver echogenicity is diffusely increased. Liver is enlarged, measuring 18.7 cm in length.  IVC: No abnormality visualized in visualized portions. Portions of the inferior vena cava are obscured by gas.  Pancreas: Visualized portion unremarkable. Portions of pancreas are obscured by gas.  Spleen: Spleen is enlarged, measuring 20.4 x 17.2 x 7.5 cm. Splenic volume is measured at 1,379 cubic cm. No focal splenic lesions are identified.  ,  Right  Kidney: Length: 10.1 cm. Echogenicity within normal limits. There is renal cortical thinning. No mass or hydronephrosis visualized.  Left Kidney: Length: 10.4 cm. Echogenicity within normal limits. There is renal cortical thinning. No mass or hydronephrosis visualized.  Abdominal aorta: No aneurysm visualized.  Other findings: No demonstrable ascites.  IMPRESSION: Gallbladder absent.  There is hepatomegaly and marked splenomegaly. Liver echogenicity is diffusely increased. This finding raises question of underlying parenchymal liver disease. There also may well be hepatic steatosis causing the increased echogenicity in the liver. While no focal liver lesions are identified, it must be cautioned that the sensitivity of ultrasound for liver lesion detection is diminished significantly in this circumstance.  Portions of the pancreas and inferior vena cava are obscured by gas.  There is renal cortical thinning of both kidneys, a finding that may be seen with medical renal disease. No obstructing foci identified in either kidney.   Electronically Signed   By: Lowella Grip M.D.   On: 07/24/2014 14:34   Dg Abd Acute W/chest  07/23/2014   CLINICAL DATA:  Vomiting and abdominal pain  EXAM: ACUTE ABDOMEN SERIES (ABDOMEN 2 VIEW & CHEST 1 VIEW)  COMPARISON:  Radiograph 10/28/2011  FINDINGS: Cardiac silhouette is enlarged. There is increased density over the right cardiophrenic angle. No free air beneath the hemidiaphragms.  No dilated large or small bowel. Gas in the rectum. No organomegaly. No pathologic calcifications. Prior cholecystectomy.  IMPRESSION: 1. Increased density at the right cardiophrenic angle. Cannot exclude pneumonia or aspiration pneumonitis. 2. No evidence of bowel obstruction.   Electronically Signed   By: Suzy Bouchard M.D.   On: 07/23/2014 14:30    Scheduled Meds: . enoxaparin (LOVENOX) injection  40 mg Subcutaneous Q24H  . insulin aspart  0-15 Units Subcutaneous TID WC  . insulin aspart   0-5 Units Subcutaneous QHS  . insulin glargine  30 Units Subcutaneous Q lunch  . levothyroxine  300 mcg Oral QAC breakfast  . losartan  100 mg Oral Daily  . pantoprazole (PROTONIX) IV  40 mg Intravenous Q24H  . verapamil  180 mg Oral Daily   Continuous Infusions: . sodium chloride 75 mL/hr at 07/24/14 1305    Active Problems:   Abdominal pain   Chronic kidney disease   Morbid obesity due to excess calories   Diabetes   Hypertension   Elevated LFTs    Time spent: 67mins    MEMON,JEHANZEB  [facility] Pager [phone]. If 7PM-7AM, please contact night-coverage at [email or web address], password [facility] 07/24/2014, 3:18 PM  LOS: 1 day

## 2014-07-24 NOTE — Consult Note (Signed)
Referring Provider: Dr. Anastasio Champion Primary Care Physician:  Lynne Logan, MD Primary Gastroenterologist:  Dr. Oneida Alar   Date of Admission: 07/23/14 Date of Consultation: 07/24/14  Reason for Consultation: Elevated LFTs  HPI:  [name] [name] is a 68 y.o. year old male presenting to the emergency room with several day history of diffuse abdominal pain, N/V, and found to have elevated LFTs.  Thursday evening acute onset of abdominal pain, N/V. Vomiting repeatedly. Pain resolved after episodes of vomiting. Ate chili on Saturday with recurrent periumbilical/diffuse abdominal pain that persisted, resulting in ED presentation. Was able to tolerate water otherwise no appetite. Recurrent N/V yesterday. No hematemesis. No fever/chills. No prior episodes. Denies ETOH use. Denies illicit drug use. Has only taken 1 dose of tylenol recently. No history of liver disease. Denies any history of GERD. States pain, N/V have resolved as of admission. Ultrasound of abdomen ordered for this morning.   No prior colonoscopy. Intermittent constipation. No rectal bleeding.   Past Medical History  Diagnosis Date  . Peripheral vascular disease     neuropathy in toes   . Shortness of breath     due ot pain in knees   . Sleep apnea     sleep study 3/29 13 ? location   . Pneumonia     hx of   . Diabetes mellitus   . Hypothyroidism   . Chronic kidney disease     stage III kidney disease   . Arthritis     knees, back     Past Surgical History  Procedure Laterality Date  . Cholecystectomy  1992  . Total knee arthroplasty  11/10/2011    Procedure: TOTAL KNEE ARTHROPLASTY;  Surgeon: Mauri Pole, MD;  Location: [facility];  Service: Orthopedics;  Laterality: Right;    Prior to Admission medications   Medication Sig Start Date End Date Taking? Authorizing Provider  Insulin Glargine (LANTUS SOLOSTAR) 100 UNIT/ML Solostar Pen Inject 51 Units into the skin daily.   Yes Historical Provider, MD  insulin lispro  (HUMALOG KWIKPEN) 100 UNIT/ML KiwkPen Inject 3-7 Units into the skin 3 (three) times daily.   Yes Historical Provider, MD  levothyroxine (SYNTHROID, LEVOTHROID) 300 MCG tablet Take 300 mcg by mouth every morning.   Yes Historical Provider, MD  losartan (COZAAR) 100 MG tablet Take 100 mg by mouth every morning.   Yes Historical Provider, MD  verapamil (CALAN-SR) 180 MG CR tablet Take 180 mg by mouth daily.   Yes Historical Provider, MD  ferrous sulfate 325 (65 FE) MG tablet Take 1 tablet (325 mg total) by mouth 3 (three) times daily after meals. Patient not taking: Reported on 07/23/2014 11/13/11 11/12/12  Lucille Passy Babish, PA-C    Current Facility-Administered Medications  Medication Dose Route Frequency Provider Last Rate Last Dose  . 0.9 %  sodium chloride infusion   Intravenous Continuous Doree Albee, MD 75 mL/hr at 07/23/14 2309    . enoxaparin (LOVENOX) injection 40 mg  40 mg Subcutaneous Q24H Nimish C Gosrani, MD   40 mg at 07/23/14 2300  . insulin aspart (novoLOG) injection 0-15 Units  0-15 Units Subcutaneous TID WC Nimish C Gosrani, MD      . insulin aspart (novoLOG) injection 0-5 Units  0-5 Units Subcutaneous QHS Doree Albee, MD   2 Units at 07/23/14 2300  . insulin glargine (LANTUS) injection 50 Units  50 Units Subcutaneous Daily Nimish C Gosrani, MD      . levothyroxine (SYNTHROID, LEVOTHROID) tablet 300  mcg  300 mcg Oral QAC breakfast Nimish C Anastasio Champion, MD      . losartan (COZAAR) tablet 100 mg  100 mg Oral Daily Nimish C Anastasio Champion, MD   100 mg at 07/23/14 2300  . ondansetron (ZOFRAN) tablet 4 mg  4 mg Oral Q6H PRN Nimish Luther Parody, MD       Or  . ondansetron (ZOFRAN) injection 4 mg  4 mg Intravenous Q6H PRN Nimish C Gosrani, MD      . verapamil (CALAN-SR) CR tablet 180 mg  180 mg Oral Daily Doree Albee, MD        Allergies as of 07/23/2014  . (No Known Allergies)    Family History  Problem Relation Age of Onset  . Colon cancer Neg Hx     History   Social  History  . Marital Status: Divorced    Spouse Name: N/A    Number of Children: N/A  . Years of Education: N/A   Occupational History  . Not on file.   Social History Main Topics  . Smoking status: Former Smoker    Quit date: 08/04/2007  . Smokeless tobacco: Never Used  . Alcohol Use: No  . Drug Use: No  . Sexual Activity: Not on file   Other Topics Concern  . Not on file   Social History Narrative    Review of Systems: As mentioned in HPI  Physical Exam: Vital signs in last 24 hours: Temp:  [98 F (36.7 C)-98.7 F (37.1 C)] 98.7 F (37.1 C) (12/22 0549) Pulse Rate:  [68-90] 68 (12/22 0549) Resp:  [18-20] 20 (12/22 0549) BP: (113-168)/(70-100) 127/77 mmHg (12/22 0549) SpO2:  [91 %-97 %] 91 % (12/22 0549) Weight:  [310 lb (140.615 kg)-317 lb 10.9 oz (144.1 kg)] 317 lb 10.9 oz (144.1 kg) (12/21 2019) Last BM Date: 07/23/14 General:   Alert,  Well-developed, well-nourished, pleasant and cooperative in NAD. Jaundiced.  Head:  Normocephalic and atraumatic. Eyes:  +scleral icterus Ears:  Normal auditory acuity. Nose:  No deformity, discharge,  or lesions. Mouth:  Oral mucosa pink and moist Lungs:  Clear throughout to auscultation.   No wheezes, crackles, or rhonchi. No acute distress. Heart: S1 S2 present without  murmurs Abdomen:  Soft, nontender and nondistended. Obese, difficult to appreciate HSM due to large AP diameter. Large right-sided open cholecystectomy scar. Normal bowel sounds, without guarding, and without rebound.   Rectal:  Deferred until time of colonoscopy.   Msk:  Symmetrical without gross deformities. Normal posture. Extremities:  Without edema. Chronic venous stasis changes noted.  Neurologic:  Alert and  oriented x4;  grossly normal neurologically. Psych:  Alert and cooperative. Normal mood and affect.  Intake/Output from previous day: 12/21 0701 - 12/22 0700 In: 240 [P.O.:240] Out: -  Intake/Output this shift:    Lab Results:  Recent Labs   07/23/14 1245 07/24/14 0546  WBC 10.5 7.4  HGB 16.7 14.2  HCT 49.4 42.7  PLT 132* 121*   BMET  Recent Labs  07/23/14 1245 07/24/14 0546  NA 137 134*  K 3.8 2.9*  CL 94* 100  CO2 26 25  GLUCOSE 219* 147*  BUN 46* 46*  CREATININE 2.91* 2.72*  CALCIUM 9.7 8.5   LFT  Recent Labs  07/23/14 1245 07/24/14 0546  PROT 8.4* 7.2  ALBUMIN 3.8 3.2*  AST 114* 58*  ALT 72* 57*  ALKPHOS 211* 159*  BILITOT 6.6* 8.5*   PT/INR  Recent Labs  07/24/14 0546  LABPROT 14.3  INR 1.10   Lab Results  Component Value Date   LIPASE 44 07/23/2014    Studies/Results: Ct Abdomen Pelvis Wo Contrast  07/23/2014   CLINICAL DATA:  Upper abdominal pain for 3 days  EXAM: CT ABDOMEN AND PELVIS WITHOUT CONTRAST  TECHNIQUE: Multidetector CT imaging of the abdomen and pelvis was performed following the standard protocol without IV contrast.  COMPARISON:  Plain film from earlier in the same day  FINDINGS: Lung bases are free of acute infiltrate or sizable effusion. The abnormality seen on recent chest x-ray is not borne out on the CT.  The liver, spleen, adrenal glands and pancreas are within normal limits. The gallbladder has been surgically removed. The kidneys show no obstructive changes. \  The appendix is well visualized and within normal limits. Mild diverticular change is seen without evidence of diverticulitis. The bladder is partially distended.  Adjacent to the bladder extending superiorly into the left there is a 8.0 x 6.6 cm soft tissue lesion identified. It is intimately opposed to the bladder wall and may represent a large bladder mass. Further evaluation is recommended. Additionally small lymph nodes are noted in the periaortic region. The largest of these is on image number 59 of series 2 measuring 14 mm in dimension. Additionally some iliac chain lymphadenopathy is noted best seen on image number 72 and 70 of series 2 these measure approximately 12-13 mm in short axis. Degenerative changes  of the lumbar spine are seen. A a soft tissue density is noted adjacent to the distal esophagus on the first image but incompletely evaluated. CT of the chest may be helpful for further evaluation.  IMPRESSION: Soft tissue mass intimately opposed to the superior and left wall of the urinary bladder. It is uncertain whether this arises from the bladder but felt to be most likely. Periaortic and iliac chain lymph nodes are noted. Further evaluation is recommended.  The abnormality seen on recent plain film in the chest is not well visualized on this exam.  Likely lymph node adjacent to the distal esophagus. This is incompletely evaluated as it is only seen on the first image. CT of the chest may be helpful for further evaluation.  Although not mentioned in the body of the report, In the upper abdominal cavity just below the xiphoid process, there is a partially calcified soft tissue lesion. This is of uncertain significance. The possibility of a peritoneal lesion cannot be totally excluded.   Electronically Signed   By: Inez Catalina M.D.   On: 07/23/2014 17:36   Ct Chest Wo Contrast  07/23/2014   CLINICAL DATA:  Upper abdominal pain, possible periesophageal lymph node at incomplete imaging of the lung bases on CT abdomen pelvis performed earlier today.  EXAM: CT CHEST WITHOUT CONTRAST  TECHNIQUE: Multidetector CT imaging of the chest was performed following the standard protocol without IV contrast.  COMPARISON:  CT abdomen/ pelvis performed earlier today, dictated separately  FINDINGS: Minimal patchy 1-2 mm bilateral upper lobe pulmonary nodules are identified with a possible tree-in-bud type configuration suggesting small airways infection. No focal lobar opacity or mass. No pleural or pericardial effusion.  No acute osseous abnormality. Findings referable to the upper abdomen have been dictated under a separate report earlier today. Confluent subcarinal lymph node measures 1.5 cm image 27. Representative  pretracheal node measures 0.9 cm image 21. AP window lymph nodes measuring 0.6 cm in short axis diameter and smaller are identified, representative node image 17. No axillary lymphadenopathy. Allowing for lack of  contrast, no hilar lymphadenopathy.  IMPRESSION: Mild subcarinal lymphadenopathy. This is amenable to further evaluation at presumed global staging at PET-CT performed on a nonemergent outpatient basis, pending on results of presumed pending cystoscopy for a previously reported bladder mass. This could be reactive although metastatic lymphadenopathy could appear similar.  Patchy areas of pulmonary parenchymal nodularity with a vague tree-in-bud type appearance most typical for small airways infection. This is also amenable to followup at presumed future restaging studies.   Electronically Signed   By: Conchita Paris M.D.   On: 07/23/2014 19:52   Dg Abd Acute W/chest  07/23/2014   CLINICAL DATA:  Vomiting and abdominal pain  EXAM: ACUTE ABDOMEN SERIES (ABDOMEN 2 VIEW & CHEST 1 VIEW)  COMPARISON:  Radiograph 10/28/2011  FINDINGS: Cardiac silhouette is enlarged. There is increased density over the right cardiophrenic angle. No free air beneath the hemidiaphragms.  No dilated large or small bowel. Gas in the rectum. No organomegaly. No pathologic calcifications. Prior cholecystectomy.  IMPRESSION: 1. Increased density at the right cardiophrenic angle. Cannot exclude pneumonia or aspiration pneumonitis. 2. No evidence of bowel obstruction.   Electronically Signed   By: Suzy Bouchard M.D.   On: 07/23/2014 14:30    Impression: 68 year old male admitted with several day history of acute onset abdominal pain, N/V, and elevated LFTs. No etiology per CT; gallbladder absent. US abdomen ordered to further evaluate biliary tree. May ultimately need MRCP but will await US findings. Acute hepatitis panel pending. As of note, patient denies ETOH use, illicit drug use. Clinically, abdominal pain, N/V have  improved with supportive measures. Will await US abdomen and provide further recommendations thereafter.   Plan: Follow-up on ordered ultrasound of abdomen for this morning Add PPI Fractionate bilirubin Continue to follow LFTs Initial screening colonoscopy as outpatient Further abnormalities noted on CT addressed per hospitalist, urology consultation   Orvil Feil, ANP-BC [facility]    LOS: 1 day    07/24/2014, 9:10 AM    Addendum at 1500: US abdomen reviewed. No evidence for choledocholithiasis. Question underlying liver disease with chronic thrombocytopenia, low albumin. Patient denied ETOH use with me. Acute hepatitis panel is pending. Will order autoimmune/PBC serologies. Echocardiogram to be ordered.   Orvil Feil, ANP-BC [facility] Gastroenter[facility]

## 2014-07-24 NOTE — Care Management Utilization Note (Signed)
UR complete 

## 2014-07-24 NOTE — Consult Note (Signed)
Urology Consult  Consulting [id number]  CC: Bladder mass  HPI: This is a 68 year old male who was recently admitted to [facility] with abdominal pain, nausea and vomiting. Additionally, he had elevated transaminases and bilirubin. He underwent CT the abdomen and pelvis which revealed, among other findings, a left anterior bladder mass. There was no hydronephrosis, no upper tract abnormalities. Urologic consultation is requested.  The patient denies gross hematuria. He denies significant lower urinary tract symptomatology. He has not been treated for urinary tract infections, denies any dysuria, pneumaturia, hematochezia, mucousy stools, prior history of diverticular disease. He has no history of night sweats. He has had no recent fever or chills. He is a smoker, but quit about 7 years ago. His father did have bladder cancer, from what it sounds like he was treated twice.  PMH: Past Medical History  Diagnosis Date  . Peripheral vascular disease     neuropathy in toes   . Shortness of breath     due ot pain in knees   . Sleep apnea     sleep study 3/29 13 ? location   . Pneumonia     hx of   . Diabetes mellitus   . Hypothyroidism   . Chronic kidney disease     stage III kidney disease   . Arthritis     knees, back     PSH: Past Surgical History  Procedure Laterality Date  . Cholecystectomy  1992  . Total knee arthroplasty  11/10/2011    Procedure: TOTAL KNEE ARTHROPLASTY;  Surgeon: Mauri Pole, MD;  Location: [facility];  Service: Orthopedics;  Laterality: Right;    Allergies: No Known Allergies  Medications: Prescriptions prior to admission  Medication Sig Dispense Refill Last Dose  . Insulin Glargine (LANTUS SOLOSTAR) 100 UNIT/ML Solostar Pen Inject 51 Units into the skin daily.   07/23/2014 at Unknown time  . insulin lispro (HUMALOG KWIKPEN) 100 UNIT/ML KiwkPen Inject 3-7 Units into the skin 3 (three) times daily.   07/23/2014 at Unknown time  . levothyroxine  (SYNTHROID, LEVOTHROID) 300 MCG tablet Take 300 mcg by mouth every morning.   07/22/2014 at Unknown time  . losartan (COZAAR) 100 MG tablet Take 100 mg by mouth every morning.   07/22/2014 at Unknown time  . verapamil (CALAN-SR) 180 MG CR tablet Take 180 mg by mouth daily.   07/23/2014 at Unknown time  . ferrous sulfate 325 (65 FE) MG tablet Take 1 tablet (325 mg total) by mouth 3 (three) times daily after meals. (Patient not taking: Reported on 07/23/2014)        Social History: History   Social History  . Marital Status: Divorced    Spouse Name: N/A    Number of Children: N/A  . Years of Education: N/A   Occupational History  . Not on file.   Social History Main Topics  . Smoking status: Former Smoker    Quit date: 08/04/2007  . Smokeless tobacco: Never Used  . Alcohol Use: No  . Drug Use: No  . Sexual Activity: Not on file   Other Topics Concern  . Not on file   Social History Narrative    Family History: Family History  Problem Relation Age of Onset  . Colon cancer Neg Hx     Review of Systems: Positive: Abdominal pain, nausea, vomiting. Negative: . A further 10 point review of systems was negative except what is listed in the HPI.  Physical Exam: @VITALS2 @ General: No acute  distress.  Awake. He is significantly obese. Head:  Normocephalic.  Atraumatic. ENT:  EOMI.  Mucous membranes moist Neck:  Supple.  No lymphadenopathy. CV:  S1 present. S2 present. Regular rate. Pulmonary: Equal effort bilaterally.  Clear to auscultation bilaterally. Abdomen: Abdomen is obese. Well-healed surgical scar in right upper quadrant. No abdominal tenderness, rebound or guarding. Full exam precluded secondary to obesity. Skin:  Normal turgor.  No visible rash. Extremity: No gross deformity of bilateral upper extremities.  No gross deformity of bilateral lower extremities. Neurologic: Alert. Appropriate mood.  Penis:  Uncircumcised.  No lesions. Penis is buried and suprapubic  fat Urethra: No Foley catheter in place.  Orthotopic meatus. Scrotum: No lesions.  No ecchymosis.  No erythema. Testicles: Descended bilaterally.  No masses bilaterally. Epididymis: Palpable bilaterally.  Non Tender to palpation.  Studies:  Recent Labs     07/23/14  1245  07/24/14  0546  HGB  16.7  14.2  WBC  10.5  7.4  PLT  132*  121*    Recent Labs     07/23/14  1245  07/24/14  0546  NA  137  134*  K  3.8  2.9*  CL  94*  100  CO2  26  25  BUN  46*  46*  CREATININE  2.91*  2.72*  CALCIUM  9.7  8.5  GFRNONAA  21*  23*  GFRAA  24*  26*     Recent Labs     07/24/14  0546  INR  1.10     Invalid input(s): ABG  I reviewed the patient's CT images, both personally and with him. Laboratories were reviewed. He has stable chronic renal insufficiency with a creatinine of around 2.7. There is significant hyperbilirubinemia.  Assessment:  1. Significant, left anterior/lateral bladder wall mass. Differential includes urothelial carcinoma (I feel slightly unlikely due to lack of hematuria), lymphoma, primary gastroenterologic cancer, inflammatory/diverticular disease  2. Stable chronic renal insufficiency  3. Hyperbilirubinemia  Plan: 1. I have discussed my differential with the patient-I agree with colonoscopy. This may well allow tissue diagnosis, as this bladder mass abuts the colon.  2. If tissue diagnosis is not possible by colonoscopy, I would recommend interventional radiology to obtain image directed biopsy-this may be easier than an anesthetic cystoscopy at this point, especially due to lack of GU availability in the near future  3. If this mass is secondary to primary urothelial origin, curative therapy would be extirpation of his bladder, and part of his colon if involved  4. Please keep me up-to-date in the Epic system regarding the patient's interventional results. Both myself and Dr. Jeffie Pollock will not be available over the next week. You can certainly call our  office if assistance necessary.    Pager:[phone]

## 2014-07-25 ENCOUNTER — Telehealth: Payer: Self-pay | Admitting: Gastroenterology

## 2014-07-25 ENCOUNTER — Other Ambulatory Visit: Payer: Self-pay

## 2014-07-25 DIAGNOSIS — R945 Abnormal results of liver function studies: Principal | ICD-10-CM

## 2014-07-25 DIAGNOSIS — R1084 Generalized abdominal pain: Secondary | ICD-10-CM

## 2014-07-25 DIAGNOSIS — N3289 Other specified disorders of bladder: Secondary | ICD-10-CM | POA: Insufficient documentation

## 2014-07-25 DIAGNOSIS — I359 Nonrheumatic aortic valve disorder, unspecified: Secondary | ICD-10-CM

## 2014-07-25 DIAGNOSIS — R935 Abnormal findings on diagnostic imaging of other abdominal regions, including retroperitoneum: Secondary | ICD-10-CM | POA: Insufficient documentation

## 2014-07-25 DIAGNOSIS — R748 Abnormal levels of other serum enzymes: Secondary | ICD-10-CM

## 2014-07-25 DIAGNOSIS — R7989 Other specified abnormal findings of blood chemistry: Secondary | ICD-10-CM

## 2014-07-25 LAB — COMPREHENSIVE METABOLIC PANEL
ALT: 44 U/L (ref 0–53)
ANION GAP: 10 (ref 5–15)
AST: 34 U/L (ref 0–37)
Albumin: 3.1 g/dL — ABNORMAL LOW (ref 3.5–5.2)
Alkaline Phosphatase: 174 U/L — ABNORMAL HIGH (ref 39–117)
BILIRUBIN TOTAL: 3 mg/dL — AB (ref 0.3–1.2)
BUN: 44 mg/dL — AB (ref 6–23)
CHLORIDE: 101 meq/L (ref 96–112)
CO2: 25 mmol/L (ref 19–32)
CREATININE: 2.88 mg/dL — AB (ref 0.50–1.35)
Calcium: 8.2 mg/dL — ABNORMAL LOW (ref 8.4–10.5)
GFR calc Af Amer: 24 mL/min — ABNORMAL LOW (ref >90)
GFR calc non Af Amer: 21 mL/min — ABNORMAL LOW (ref >90)
Glucose, Bld: 130 mg/dL — ABNORMAL HIGH (ref 70–99)
Potassium: 3 mmol/L — ABNORMAL LOW (ref 3.5–5.1)
Sodium: 136 mmol/L (ref 135–145)
Total Protein: 7.1 g/dL (ref 6.0–8.3)

## 2014-07-25 LAB — ANTI-SMOOTH MUSCLE ANTIBODY, IGG: F-ACTIN AB IGG: 27 U — AB (ref <20)

## 2014-07-25 LAB — GLUCOSE, CAPILLARY
Glucose-Capillary: 122 mg/dL — ABNORMAL HIGH (ref 70–99)
Glucose-Capillary: 199 mg/dL — ABNORMAL HIGH (ref 70–99)
Glucose-Capillary: 132 mg/dL — ABNORMAL HIGH (ref 70–99)

## 2014-07-25 LAB — IGG, IGA, IGM
IGG (IMMUNOGLOBIN G), SERUM: 1130 mg/dL (ref 650–1600)
IgA: 487 mg/dL — ABNORMAL HIGH (ref 68–379)
IgM, Serum: 145 mg/dL (ref 41–251)

## 2014-07-25 LAB — CBC
HEMATOCRIT: 42 % (ref 39.0–52.0)
Hemoglobin: 13.9 g/dL (ref 13.0–17.0)
MCH: 29 pg (ref 26.0–34.0)
MCHC: 33.1 g/dL (ref 30.0–36.0)
MCV: 87.5 fL (ref 78.0–100.0)
Platelets: 118 10*3/uL — ABNORMAL LOW (ref 150–400)
RBC: 4.8 MIL/uL (ref 4.22–5.81)
RDW: 15 % (ref 11.5–15.5)
WBC: 5.7 10*3/uL (ref 4.0–10.5)

## 2014-07-25 LAB — ANA: ANA: NEGATIVE

## 2014-07-25 LAB — MITOCHONDRIAL ANTIBODIES: Mitochondrial M2 Ab, IgG: 0.27 (ref <0.91)

## 2014-07-25 LAB — FERRITIN: Ferritin: 1174 ng/mL — ABNORMAL HIGH (ref 22–322)

## 2014-07-25 MED ORDER — PANTOPRAZOLE SODIUM 40 MG PO TBEC: 40.0000 mg | DELAYED_RELEASE_TABLET | Freq: Every day | ORAL | Status: DC

## 2014-07-25 MED ORDER — POTASSIUM CHLORIDE CRYS ER 20 MEQ PO TBCR
40.0000 meq | EXTENDED_RELEASE_TABLET | ORAL | Status: AC
  Administered 2014-07-25 (×2): 40 meq via ORAL
  Filled 2014-07-25 (×2): qty 2

## 2014-07-25 NOTE — Telephone Encounter (Addendum)
Patient needs a colonoscopy and EGD (for mass seen on CT ?in colon and screen for varices) with Dr. Oneida Alar as soon as possible as outpatient. Should go home from hospital within next 24 hours.  Please update medication list when you schedule him and I will give instructions for insulin. Thanks!  Also needs LFTs done next week.

## 2014-07-25 NOTE — Discharge Summary (Signed)
Physician Discharge Summary  [name] [name] [id number] DOB: 1946/05/21 DOA: 07/23/2014  PCP: Margaretmary Bayley D, PA-C  Admit date: 07/23/2014 Discharge date: 07/25/2014  Time spent: 45 minutes  Recommendations for Outpatient Follow-up:  1. Patient will follow-up With gastroenterology (Dr. Oneida Alar) in the outpatient setting for consideration of colonoscopy and endoscopy. 2. If biopsy is not attainable by colonoscopy, he will need to be seen by urology (Dr. Luberta Robertson) to consider for cystoscopy for biopsy of bladder mass. 3. He is instructed to follow-up with the primary care physician on 12/28. I have discussed this case with his primary care doctor so that the above consultations/studies can be followed  Discharge Diagnoses:  Active Problems:   Abdominal pain   Chronic kidney disease   Morbid obesity due to excess calories   Diabetes   Hypertension   Elevated LFTs   Abnormal CT scan, pelvis   Bladder mass   Elevated liver enzymes   Discharge Condition: Improved  Diet recommendation: Low-salt, low carb  Filed Weights   07/23/14 1251 07/23/14 2019  Weight: 140.615 kg (310 lb) 144.1 kg (317 lb 10.9 oz)    History of present illness:  This patient was admitted to the hospital with abdominal pain. The patient was having a four-day history of central and lower abdominal pain which he rated as severe. This was associated with episodes of vomiting. He was evaluated in the emergency room where he was noted to have elevated LFTs/bilirubin. CT scan of the abdomen and pelvis was performed which showed possible bladder mass abutting the colon. The patient was admitted for further evaluation  Hospital Course:  Patient was monitored in the hospital as well as abdominal pain resolved. He did not have any further nausea or vomiting he was able to tolerate a solid diet. He did receive IV fluids. He was seen by gastroenterology who ordered abdominal ultrasound which showed hepatosplenomegaly  but no other findings. Hepatitis panel was also ordered which is currently still in process. A battery of other tests regarding hepatocellular disease were ordered which can be followed up in the outpatient setting.   Regarding the patient's bladder mass, it was felt to be abutting the colon. He was seen by urology who felt patient possibly has a colonoscopy in which a biopsy may be able to be performed with the mass. If this is not possible, he will need further evaluation by either urology or interventional radiology for biopsy of this bladder mass. The patient is feeling significantly improved and wishes to pursue a further workup in the outpatient setting. He's been cleared for discharge by gastroenterology.  Procedures:  Echo:Left ventricle: The cavity size was normal. Wall thickness was increased in a pattern of moderate LVH. Systolic function was normal. The estimated ejection fraction was in the range of 55% to 60%. Diastolic function is abnormal, indeterminate grade. - Aortic valve: Mildly to moderately calcified annulus. Trileaflet; mildly thickened leaflets. There was mild regurgitation. Regurgitation pressure half-time: 682 ms. - Mitral valve: Mildly to moderately calcified annulus. Mildly thickened leaflets . - Left atrium: The atrium was mildly dilated.  Consultations:  Gastroenterology  Urology  Discharge Exam: Filed Vitals:   07/24/14 2030  BP: 134/68  Pulse: 59  Temp: 98.1 F (36.7 C)  Resp: 20    General: No acute distress Cardiovascular: S1, S2, regular rate and rhythm Respiratory: Clear to auscultation bilaterally  Discharge Instructions   Discharge Instructions    Call MD for:  persistant nausea and vomiting    Complete by:  As directed      Call MD for:  severe uncontrolled pain    Complete by:  As directed      Diet - low sodium heart healthy    Complete by:  As directed      Diet Carb Modified    Complete by:  As directed       Increase activity slowly    Complete by:  As directed           Discharge Medication List as of 07/25/2014  4:06 PM    START taking these medications   Details  pantoprazole (PROTONIX) 40 MG tablet Take 1 tablet (40 mg total) by mouth daily before supper., Starting 07/25/2014, Until Discontinued, Print      CONTINUE these medications which have NOT CHANGED   Details  Insulin Glargine (LANTUS SOLOSTAR) 100 UNIT/ML Solostar Pen Inject 51 Units into the skin daily., Until Discontinued, Historical Med    insulin lispro (HUMALOG KWIKPEN) 100 UNIT/ML KiwkPen Inject 3-7 Units into the skin 3 (three) times daily., Until Discontinued, Historical Med    levothyroxine (SYNTHROID, LEVOTHROID) 300 MCG tablet Take 300 mcg by mouth every morning., Until Discontinued, Historical Med    losartan (COZAAR) 100 MG tablet Take 100 mg by mouth every morning., Until Discontinued, Historical Med    verapamil (CALAN-SR) 180 MG CR tablet Take 180 mg by mouth daily., Until Discontinued, Historical Med    ferrous sulfate 325 (65 FE) MG tablet Take 1 tablet (325 mg total) by mouth 3 (three) times daily after meals., Starting 11/13/2011, Until Sat 11/12/12, No Print       No Known Allergies Follow-up Information    Follow up with EDENFIELD, Leonides Schanz, PA-C On 07/30/2014.   Specialty:  Physician Assistant   Why:  1:00pm   Contact information:   [place] [address] [phone]       Follow up with Barney Drain, MD.   Specialty:  Gastroenterology   Why:  will call you for colonoscopy appointment   Contact information:   [place] [address] [place] [address] [phone]       Follow up with Jorja Loa, MD.   Specialty:  Urology   Why:  call for appointment in 2-3 weeks   Contact information:   [place] [address] [phone]        The results of significant diagnostics from this hospitalization (including imaging,  microbiology, ancillary and laboratory) are listed below for reference.    Significant Diagnostic Studies: Ct Abdomen Pelvis Wo Contrast  07/23/2014   CLINICAL DATA:  Upper abdominal pain for 3 days  EXAM: CT ABDOMEN AND PELVIS WITHOUT CONTRAST  TECHNIQUE: Multidetector CT imaging of the abdomen and pelvis was performed following the standard protocol without IV contrast.  COMPARISON:  Plain film from earlier in the same day  FINDINGS: Lung bases are free of acute infiltrate or sizable effusion. The abnormality seen on recent chest x-ray is not borne out on the CT.  The liver, spleen, adrenal glands and pancreas are within normal limits. The gallbladder has been surgically removed. The kidneys show no obstructive changes. \  The appendix is well visualized and within normal limits. Mild diverticular change is seen without evidence of diverticulitis. The bladder is partially distended.  Adjacent to the bladder extending superiorly into the left there is a 8.0 x 6.6 cm soft tissue lesion identified. It is intimately opposed to the  bladder wall and may represent a large bladder mass. Further evaluation is recommended. Additionally small lymph nodes are noted in the periaortic region. The largest of these is on image number 59 of series 2 measuring 14 mm in dimension. Additionally some iliac chain lymphadenopathy is noted best seen on image number 72 and 70 of series 2 these measure approximately 12-13 mm in short axis. Degenerative changes of the lumbar spine are seen. A a soft tissue density is noted adjacent to the distal esophagus on the first image but incompletely evaluated. CT of the chest may be helpful for further evaluation.  IMPRESSION: Soft tissue mass intimately opposed to the superior and left wall of the urinary bladder. It is uncertain whether this arises from the bladder but felt to be most likely. Periaortic and iliac chain lymph nodes are noted. Further evaluation is recommended.  The abnormality  seen on recent plain film in the chest is not well visualized on this exam.  Likely lymph node adjacent to the distal esophagus. This is incompletely evaluated as it is only seen on the first image. CT of the chest may be helpful for further evaluation.  Although not mentioned in the body of the report, In the upper abdominal cavity just below the xiphoid process, there is a partially calcified soft tissue lesion. This is of uncertain significance. The possibility of a peritoneal lesion cannot be totally excluded.   Electronically Signed   By: Inez Catalina M.D.   On: 07/23/2014 17:36   Ct Chest Wo Contrast  07/23/2014   CLINICAL DATA:  Upper abdominal pain, possible periesophageal lymph node at incomplete imaging of the lung bases on CT abdomen pelvis performed earlier today.  EXAM: CT CHEST WITHOUT CONTRAST  TECHNIQUE: Multidetector CT imaging of the chest was performed following the standard protocol without IV contrast.  COMPARISON:  CT abdomen/ pelvis performed earlier today, dictated separately  FINDINGS: Minimal patchy 1-2 mm bilateral upper lobe pulmonary nodules are identified with a possible tree-in-bud type configuration suggesting small airways infection. No focal lobar opacity or mass. No pleural or pericardial effusion.  No acute osseous abnormality. Findings referable to the upper abdomen have been dictated under a separate report earlier today. Confluent subcarinal lymph node measures 1.5 cm image 27. Representative pretracheal node measures 0.9 cm image 21. AP window lymph nodes measuring 0.6 cm in short axis diameter and smaller are identified, representative node image 17. No axillary lymphadenopathy. Allowing for lack of contrast, no hilar lymphadenopathy.  IMPRESSION: Mild subcarinal lymphadenopathy. This is amenable to further evaluation at presumed global staging at PET-CT performed on a nonemergent outpatient basis, pending on results of presumed pending cystoscopy for a previously  reported bladder mass. This could be reactive although metastatic lymphadenopathy could appear similar.  Patchy areas of pulmonary parenchymal nodularity with a vague tree-in-bud type appearance most typical for small airways infection. This is also amenable to followup at presumed future restaging studies.   Electronically Signed   By: Conchita Paris M.D.   On: 07/23/2014 19:52   US Abdomen Complete  07/24/2014   CLINICAL DATA:  Abdominal pain and elevated liver enzymes  EXAM: ULTRASOUND ABDOMEN COMPLETE  COMPARISON:  CT abdomen and pelvis July 23, 2014  FINDINGS: Gallbladder: Surgically absent.  Common bile duct: Diameter: 5 mm. There is no intrahepatic, common hepatic, or common bile duct dilatation.  Liver: No focal lesion identified. Liver echogenicity is diffusely increased. Liver is enlarged, measuring 18.7 cm in length.  IVC: No abnormality visualized in  visualized portions. Portions of the inferior vena cava are obscured by gas.  Pancreas: Visualized portion unremarkable. Portions of pancreas are obscured by gas.  Spleen: Spleen is enlarged, measuring 20.4 x 17.2 x 7.5 cm. Splenic volume is measured at 1,379 cubic cm. No focal splenic lesions are identified.  ,  Right Kidney: Length: 10.1 cm. Echogenicity within normal limits. There is renal cortical thinning. No mass or hydronephrosis visualized.  Left Kidney: Length: 10.4 cm. Echogenicity within normal limits. There is renal cortical thinning. No mass or hydronephrosis visualized.  Abdominal aorta: No aneurysm visualized.  Other findings: No demonstrable ascites.  IMPRESSION: Gallbladder absent.  There is hepatomegaly and marked splenomegaly. Liver echogenicity is diffusely increased. This finding raises question of underlying parenchymal liver disease. There also may well be hepatic steatosis causing the increased echogenicity in the liver. While no focal liver lesions are identified, it must be cautioned that the sensitivity of ultrasound for  liver lesion detection is diminished significantly in this circumstance.  Portions of the pancreas and inferior vena cava are obscured by gas.  There is renal cortical thinning of both kidneys, a finding that may be seen with medical renal disease. No obstructing foci identified in either kidney.   Electronically Signed   By: Lowella Grip M.D.   On: 07/24/2014 14:34   Dg Abd Acute W/chest  07/23/2014   CLINICAL DATA:  Vomiting and abdominal pain  EXAM: ACUTE ABDOMEN SERIES (ABDOMEN 2 VIEW & CHEST 1 VIEW)  COMPARISON:  Radiograph 10/28/2011  FINDINGS: Cardiac silhouette is enlarged. There is increased density over the right cardiophrenic angle. No free air beneath the hemidiaphragms.  No dilated large or small bowel. Gas in the rectum. No organomegaly. No pathologic calcifications. Prior cholecystectomy.  IMPRESSION: 1. Increased density at the right cardiophrenic angle. Cannot exclude pneumonia or aspiration pneumonitis. 2. No evidence of bowel obstruction.   Electronically Signed   By: Suzy Bouchard M.D.   On: 07/23/2014 14:30    Microbiology: No results found for this or any previous visit (from the past 240 hour(s)).   Labs: Basic Metabolic Panel:  Recent Labs Lab 07/23/14 1245 07/24/14 0546 07/25/14 0542  NA 137 134* 136  K 3.8 2.9* 3.0*  CL 94* 100 101  CO2 26 25 25   GLUCOSE 219* 147* 130*  BUN 46* 46* 44*  CREATININE 2.91* 2.72* 2.88*  CALCIUM 9.7 8.5 8.2*   Liver Function Tests:  Recent Labs Lab 07/23/14 1245 07/24/14 0546 07/25/14 0542  AST 114* 58* 34  ALT 72* 57* 44  ALKPHOS 211* 159* 174*  BILITOT 6.6* 8.4*  8.5* 3.0*  PROT 8.4* 7.2 7.1  ALBUMIN 3.8 3.2* 3.1*    Recent Labs Lab 07/23/14 1245  LIPASE 44   No results for input(s): AMMONIA in the last 168 hours. CBC:  Recent Labs Lab 07/23/14 1245 07/24/14 0546 07/25/14 0542  WBC 10.5 7.4 5.7  NEUTROABS 9.1*  --   --   HGB 16.7 14.2 13.9  HCT 49.4 42.7 42.0  MCV 87.3 86.6 87.5  PLT 132*  121* 118*   Cardiac Enzymes:  Recent Labs Lab 07/23/14 1245  TROPONINI <0.30   BNP: BNP (last 3 results)  Recent Labs  07/23/14 1245  PROBNP 299.5*   CBG:  Recent Labs Lab 07/24/14 1626 07/24/14 2049 07/25/14 0738 07/25/14 1142 07/25/14 1615  GLUCAP 75 117* 122* 199* 132*       Signed:  Channie Bostick  [facility] 07/25/2014, 8:10 PM

## 2014-07-25 NOTE — Telephone Encounter (Signed)
Routing to Doris 

## 2014-07-25 NOTE — Care Management Note (Signed)
    Page 1 of 1   07/25/2014     11:06:22 AM CARE MANAGEMENT NOTE 07/25/2014  Patient:  [name] [name]   Account Number:  [id number]  Date Initiated:  07/25/2014  Documentation initiated by:  Theophilus Kinds  Subjective/Objective Assessment:   Pt admitted from home with abd pain. Pt lives with a friend and will return home at discharge. Pt is independent with ADL's. Pt has a cane that he uses for a bad knee.     Action/Plan:   No CM needs noted. Anticipate d/c within 24-48 hours.   Anticipated DC Date:  07/26/2014   Anticipated DC Plan:  [place]  CM consult      Choice offered to / List presented to:             Status of service:  Completed, signed off Medicare Important Message given?   (If response is "NO", the following Medicare IM given date fields will be blank) Date Medicare IM given:   Medicare IM given by:   Date Additional Medicare IM given:   Additional Medicare IM given by:    Discharge Disposition:  HOME/SELF CARE  Per UR Regulation:    If discussed at Long Length of Stay Meetings, dates discussed:    Comments:  07/25/14 [place], RN BSN CM

## 2014-07-25 NOTE — Progress Notes (Addendum)
Subjective:  Patient wants to go home today. Feels much better. Tolerating regular diet. Patient states he had been on Crestor but about 6 months ago switched to Lipitor. Stopped about 2 weeks ago worried that it was cause of elevated sugars. No BM in 2 days.   Objective: Vital signs in last 24 hours: Temp:  [97.8 F (36.6 C)-98.1 F (36.7 C)] 98.1 F (36.7 C) (12/22 2030) Pulse Rate:  [50-59] 59 (12/22 2030) Resp:  [15-20] 20 (12/22 2030) BP: (111-134)/(62-68) 134/68 mmHg (12/22 2030) SpO2:  [96 %-97 %] 97 % (12/22 2030) Last BM Date: 07/23/14 General:   Alert,  Well-developed, well-nourished, pleasant and cooperative in NAD Head:  Normocephalic and atraumatic. Eyes:  Sclera clear, no icterus.  Abdomen:  Soft, nontender and nondistended.  Normal bowel sounds, without guarding, and without rebound.   Extremities:  Without clubbing, deformity or edema. Neurologic:  Alert and  oriented x4;  grossly normal neurologically. Skin:  Intact without significant lesions or rashes. Psych:  Alert and cooperative. Normal mood and affect.  Intake/Output from previous day: 12/22 0701 - 12/23 0700 In: 1543.8 [P.O.:720; I.V.:823.8] Out: 700 [Urine:700] Intake/Output this shift:    Lab Results: CBC  Recent Labs  07/23/14 1245 07/24/14 0546 07/25/14 0542  WBC 10.5 7.4 5.7  HGB 16.7 14.2 13.9  HCT 49.4 42.7 42.0  MCV 87.3 86.6 87.5  PLT 132* 121* 118*   BMET  Recent Labs  07/23/14 1245 07/24/14 0546 07/25/14 0542  NA 137 134* 136  K 3.8 2.9* 3.0*  CL 94* 100 101  CO2 26 25 25   GLUCOSE 219* 147* 130*  BUN 46* 46* 44*  CREATININE 2.91* 2.72* 2.88*  CALCIUM 9.7 8.5 8.2*   LFTs  Recent Labs  07/23/14 1245 07/24/14 0546 07/25/14 0542  BILITOT 6.6* 8.4*  8.5* 3.0*  BILIDIR  --  5.7*  --   IBILI  --  2.7*  --   ALKPHOS 211* 159* 174*  AST 114* 58* 34  ALT 72* 57* 44  PROT 8.4* 7.2 7.1  ALBUMIN 3.8 3.2* 3.1*    Recent Labs  07/23/14 1245  LIPASE 44    PT/INR  Recent Labs  07/24/14 0546  LABPROT 14.3  INR 1.10   Lab Results  Component Value Date   FERRITIN 1174* 07/24/2014   Lab Results  Component Value Date   TSH 0.538 07/23/2014   Lab Results  Component Value Date   FERRITIN 1174* 07/24/2014     Imaging Studies: Ct Abdomen Pelvis Wo Contrast  07/23/2014   CLINICAL DATA:  Upper abdominal pain for 3 days  EXAM: CT ABDOMEN AND PELVIS WITHOUT CONTRAST  TECHNIQUE: Multidetector CT imaging of the abdomen and pelvis was performed following the standard protocol without IV contrast.  COMPARISON:  Plain film from earlier in the same day  FINDINGS: Lung bases are free of acute infiltrate or sizable effusion. The abnormality seen on recent chest x-ray is not borne out on the CT.  The liver, spleen, adrenal glands and pancreas are within normal limits. The gallbladder has been surgically removed. The kidneys show no obstructive changes. \  The appendix is well visualized and within normal limits. Mild diverticular change is seen without evidence of diverticulitis. The bladder is partially distended.  Adjacent to the bladder extending superiorly into the left there is a 8.0 x 6.6 cm soft tissue lesion identified. It is intimately opposed to the bladder wall and may represent a large bladder mass. Further evaluation is recommended. Additionally  small lymph nodes are noted in the periaortic region. The largest of these is on image number 59 of series 2 measuring 14 mm in dimension. Additionally some iliac chain lymphadenopathy is noted best seen on image number 72 and 70 of series 2 these measure approximately 12-13 mm in short axis. Degenerative changes of the lumbar spine are seen. A a soft tissue density is noted adjacent to the distal esophagus on the first image but incompletely evaluated. CT of the chest may be helpful for further evaluation.  IMPRESSION: Soft tissue mass intimately opposed to the superior and left wall of the urinary  bladder. It is uncertain whether this arises from the bladder but felt to be most likely. Periaortic and iliac chain lymph nodes are noted. Further evaluation is recommended.  The abnormality seen on recent plain film in the chest is not well visualized on this exam.  Likely lymph node adjacent to the distal esophagus. This is incompletely evaluated as it is only seen on the first image. CT of the chest may be helpful for further evaluation.  Although not mentioned in the body of the report, In the upper abdominal cavity just below the xiphoid process, there is a partially calcified soft tissue lesion. This is of uncertain significance. The possibility of a peritoneal lesion cannot be totally excluded.   Electronically Signed   By: Inez Catalina M.D.   On: 07/23/2014 17:36   Ct Chest Wo Contrast  07/23/2014   CLINICAL DATA:  Upper abdominal pain, possible periesophageal lymph node at incomplete imaging of the lung bases on CT abdomen pelvis performed earlier today.  EXAM: CT CHEST WITHOUT CONTRAST  TECHNIQUE: Multidetector CT imaging of the chest was performed following the standard protocol without IV contrast.  COMPARISON:  CT abdomen/ pelvis performed earlier today, dictated separately  FINDINGS: Minimal patchy 1-2 mm bilateral upper lobe pulmonary nodules are identified with a possible tree-in-bud type configuration suggesting small airways infection. No focal lobar opacity or mass. No pleural or pericardial effusion.  No acute osseous abnormality. Findings referable to the upper abdomen have been dictated under a separate report earlier today. Confluent subcarinal lymph node measures 1.5 cm image 27. Representative pretracheal node measures 0.9 cm image 21. AP window lymph nodes measuring 0.6 cm in short axis diameter and smaller are identified, representative node image 17. No axillary lymphadenopathy. Allowing for lack of contrast, no hilar lymphadenopathy.  IMPRESSION: Mild subcarinal lymphadenopathy.  This is amenable to further evaluation at presumed global staging at PET-CT performed on a nonemergent outpatient basis, pending on results of presumed pending cystoscopy for a previously reported bladder mass. This could be reactive although metastatic lymphadenopathy could appear similar.  Patchy areas of pulmonary parenchymal nodularity with a vague tree-in-bud type appearance most typical for small airways infection. This is also amenable to followup at presumed future restaging studies.   Electronically Signed   By: Conchita Paris M.D.   On: 07/23/2014 19:52   US Abdomen Complete  07/24/2014   CLINICAL DATA:  Abdominal pain and elevated liver enzymes  EXAM: ULTRASOUND ABDOMEN COMPLETE  COMPARISON:  CT abdomen and pelvis July 23, 2014  FINDINGS: Gallbladder: Surgically absent.  Common bile duct: Diameter: 5 mm. There is no intrahepatic, common hepatic, or common bile duct dilatation.  Liver: No focal lesion identified. Liver echogenicity is diffusely increased. Liver is enlarged, measuring 18.7 cm in length.  IVC: No abnormality visualized in visualized portions. Portions of the inferior vena cava are obscured by gas.  Pancreas:  Visualized portion unremarkable. Portions of pancreas are obscured by gas.  Spleen: Spleen is enlarged, measuring 20.4 x 17.2 x 7.5 cm. Splenic volume is measured at 1,379 cubic cm. No focal splenic lesions are identified.  ,  Right Kidney: Length: 10.1 cm. Echogenicity within normal limits. There is renal cortical thinning. No mass or hydronephrosis visualized.  Left Kidney: Length: 10.4 cm. Echogenicity within normal limits. There is renal cortical thinning. No mass or hydronephrosis visualized.  Abdominal aorta: No aneurysm visualized.  Other findings: No demonstrable ascites.  IMPRESSION: Gallbladder absent.  There is hepatomegaly and marked splenomegaly. Liver echogenicity is diffusely increased. This finding raises question of underlying parenchymal liver disease. There  also may well be hepatic steatosis causing the increased echogenicity in the liver. While no focal liver lesions are identified, it must be cautioned that the sensitivity of ultrasound for liver lesion detection is diminished significantly in this circumstance.  Portions of the pancreas and inferior vena cava are obscured by gas.  There is renal cortical thinning of both kidneys, a finding that may be seen with medical renal disease. No obstructing foci identified in either kidney.   Electronically Signed   By: Lowella Grip M.D.   On: 07/24/2014 14:34   Dg Abd Acute W/chest  07/23/2014   CLINICAL DATA:  Vomiting and abdominal pain  EXAM: ACUTE ABDOMEN SERIES (ABDOMEN 2 VIEW & CHEST 1 VIEW)  COMPARISON:  Radiograph 10/28/2011  FINDINGS: Cardiac silhouette is enlarged. There is increased density over the right cardiophrenic angle. No free air beneath the hemidiaphragms.  No dilated large or small bowel. Gas in the rectum. No organomegaly. No pathologic calcifications. Prior cholecystectomy.  IMPRESSION: 1. Increased density at the right cardiophrenic angle. Cannot exclude pneumonia or aspiration pneumonitis. 2. No evidence of bowel obstruction.   Electronically Signed   By: Suzy Bouchard M.D.   On: 07/23/2014 14:30  [2 weeks]   Assessment: 68 year old male admitted with several day history of acute onset abdominal pain, N/V, and elevated LFTs. CT with abnormalities as noted; gallbladder absent. US abdomen showed hepatosplenomegaly.  No biliary dilation. As of note, patient denies ETOH use, illicit drug use. Recently stopped lipitor use.  Clinically, abdominal pain, N/V have resolved and LFTs improved. Multiple labs pending.  Patient evaluated by urology for mass opposed to the superior and left anterior wall of bladder uncertain as to where it rises from but felt to arise from bladder. Abnormal chest CT as well with lymphadenopathy. Urology is suggesting a colonoscopy vs IR to obtain biopsy due to  the lack of urology coverage over the next week.   Patient has never had a colonoscopy.  Plan: 1. F/u pending labs. 2. He will need colonoscopy and upper endoscopy as an outpatient. We will make arrangements. Please note, patient does not want to stay for inpatient work up. 3. F/u ECHO.  4. Stable for discharge from GI standpoint. Would recommend repeat LFTs next week.    LOS: 2 days   Neil Crouch  07/25/2014, 7:44 AM  Patient seen and examined:  I agree with above assessment and recommendations.   Previously discussed case with Dr. Oneida Alar, primary gastroenterologist. Plans for expedited colonoscopy (and EGD). If mass noted in lumen of colon, biopsy to be done.   Laureen Ochs. Bobby Rumpf, PA-C 12/23/20153:25 PM  Attending note:  I have discussed biopsy issues with Dr. Roderic Palau

## 2014-07-25 NOTE — Progress Notes (Signed)
Discharge instruction reviewed with patient. No distress noted. IV removed. Patient escorted to lobby via wheelchair.

## 2014-07-25 NOTE — Progress Notes (Signed)
  Echocardiogram 2D Echocardiogram has been performed.  [name] [name], [name] 07/25/2014, 9:39 AM

## 2014-07-26 LAB — CERULOPLASMIN: CERULOPLASMIN: 33 mg/dL (ref 18–36)

## 2014-07-30 ENCOUNTER — Other Ambulatory Visit: Payer: Self-pay

## 2014-07-30 DIAGNOSIS — I8500 Esophageal varices without bleeding: Secondary | ICD-10-CM

## 2014-07-30 DIAGNOSIS — R945 Abnormal results of liver function studies: Secondary | ICD-10-CM

## 2014-07-30 DIAGNOSIS — R7989 Other specified abnormal findings of blood chemistry: Secondary | ICD-10-CM

## 2014-07-30 DIAGNOSIS — K6389 Other specified diseases of intestine: Secondary | ICD-10-CM

## 2014-07-30 NOTE — Telephone Encounter (Signed)
Gastroenterology Pre-Procedure Review  Request Date: 07/30/2014 Requesting Physician: Neil Crouch, PA and Dr. Oneida Alar  Per Neil Crouch, PA schedule pt for TCS and EGD ( Mass seen in colon on CT and screen for varices on the EGD  PATIENT REVIEW QUESTIONS: The patient responded to the following health history questions as indicated:    1. Diabetes Melitis: YES 2. Joint replacements in the past 12 months: no 3. Major health problems in the past 3 months: no 4. Has an artificial valve or MVP: no 5. Has a defibrillator: no 6. Has been advised in past to take antibiotics in advance of a procedure like teeth cleaning: no    MEDICATIONS & ALLERGIES:    Patient reports the following regarding taking any blood thinners:   Plavix? no Aspirin? YES Coumadin? no  Patient confirms/reports the following medications:  Current Outpatient Prescriptions  Medication Sig Dispense Refill  . aspirin 81 MG tablet Take 81 mg by mouth daily.    . Insulin Glargine (LANTUS SOLOSTAR) 100 UNIT/ML Solostar Pen Inject 51 Units into the skin daily. Pt takes in the AM    . insulin lispro (HUMALOG KWIKPEN) 100 UNIT/ML KiwkPen Inject 3-7 Units into the skin 3 (three) times daily.    Marland Kitchen levothyroxine (SYNTHROID, LEVOTHROID) 300 MCG tablet Take 300 mcg by mouth every morning.    Marland Kitchen losartan (COZAAR) 100 MG tablet Take 100 mg by mouth every morning.    . pantoprazole (PROTONIX) 40 MG tablet Take 1 tablet (40 mg total) by mouth daily before supper. 30 tablet 0  . verapamil (CALAN-SR) 180 MG CR tablet Take 180 mg by mouth daily.    . ferrous sulfate 325 (65 FE) MG tablet Take 1 tablet (325 mg total) by mouth 3 (three) times daily after meals. (Patient not taking: Reported on 07/23/2014)     No current facility-administered medications for this visit.    Patient confirms/reports the following allergies:  No Known Allergies  No orders of the defined types were placed in this encounter.    AUTHORIZATION  INFORMATION Primary Insurance:   ID #:   Group #:  Pre-Cert / Auth required: Pre-Cert / Auth #:   Secondary Insurance:   ID #:   Group #:  Pre-Cert / Auth required: Pre-Cert / Auth #:   SCHEDULE INFORMATION: Procedure has been scheduled as follows:  Date: 08/07/2014           Time: 1:30 PM  Location: [facility] Short Stay  This Gastroenterology Pre-Precedure Review Form is being routed to the following provider(s): Barney Drain, MD

## 2014-07-30 NOTE — Telephone Encounter (Signed)
I have called pt and he is scheduled for TCS and EGD on 08/07/2013 at 1:30 PM with Dr. Oneida Alar.  He has appt at PCP at [facility] today and I have spoke to [place] at [facility] and they will do his LFT's today. Order has been faxed to them at [phone]. ( Phone number is [phone]).  Pt is aware.

## 2014-07-30 NOTE — Telephone Encounter (Signed)
Seen during recent hospitalization.  Hold iron 7 days. Day of bowel prep: Lantus 25 units daily, Humalog 1/2 of normal dose TID.

## 2014-07-31 NOTE — Telephone Encounter (Signed)
[name] [name], please note that due to the schedule I had to schedule the pt at 1:30 PM on the day of procedure. Any recommendations for that day?   Thanks!

## 2014-07-31 NOTE — Telephone Encounter (Signed)
I called [name] [name] and LMOM for him not to take his iron anymore prior to procedure and to please call me back and let me know that he got the message.

## 2014-07-31 NOTE — Telephone Encounter (Signed)
Pt called back and said he is not taking his Iron anyway. And he got the message

## 2014-08-01 ENCOUNTER — Telehealth: Payer: Self-pay

## 2014-08-01 MED ORDER — PEG-KCL-NACL-NASULF-NA ASC-C 100 G PO SOLR: 1.0000 | ORAL | Status: DC

## 2014-08-01 NOTE — Telephone Encounter (Signed)
I called [employer] @ [phone] and spoke to May L. She said that a PA is not required for the following: TCS CPT CODE 60454   AND EGD CPT CODE 09811.

## 2014-08-01 NOTE — Telephone Encounter (Signed)
Opened in error

## 2014-08-01 NOTE — Telephone Encounter (Signed)
Rx sent to [facility]  One Movie Prep ( cancelled the one sent to [facility]) Pt instructions faxed to [facility] HAND WROTE NO DIABETIC MEDS ON THE MORNING OF PROCEDURE Pt aware his instructions will be at the pharmacy.

## 2014-08-01 NOTE — Telephone Encounter (Signed)
Can have clear liquids until 5:30 am the day of procedure and then NPO. No diabetic medication the morning of procedure.

## 2014-08-01 NOTE — Addendum Note (Signed)
Addended by: Everardo All on: 08/01/2014 12:16 PM   Modules accepted: Orders

## 2014-08-02 LAB — HEMOGLOBIN A1C

## 2014-08-06 LAB — HEPATITIS PANEL, ACUTE
HCV Ab: NEGATIVE
HEP B S AG: NEGATIVE
Hep A IgM: NONREACTIVE
Hep B C IgM: NONREACTIVE

## 2014-08-23 ENCOUNTER — Telehealth: Payer: Self-pay

## 2014-08-23 ENCOUNTER — Other Ambulatory Visit: Payer: Self-pay

## 2014-08-23 DIAGNOSIS — K6389 Other specified diseases of intestine: Secondary | ICD-10-CM

## 2014-08-23 NOTE — Telephone Encounter (Signed)
Melanie from [facility] called and states that pt called hospital and cancelled his procedure for Monday 08/27/2014.  She states that pt wants office to call him to reschedule.

## 2014-08-23 NOTE — Telephone Encounter (Signed)
Pt has been rescheduled for 09/03/2014 at 12:30 Pm and he is aware to be at the hospital at 11:30 AM. Sending new instructions for the prep. He had not picked up the prescription for the Movie Prep and will call the pharmacy.  Pt did say the reason he was cancelling for 08/27/2014 was that he was very sick last night on his stomach. He drank some coffee that made him sick and vomited some all night.  He said that is the first time that coffee has every made him sick on his stomach.   He did not have any fever, chills, abdominal pain or diarrhea.  No one in his family has been sick.  He is feeling a little better today, and is slowly getting some liquids down.  He will call if he has more problems.

## 2014-08-23 NOTE — Progress Notes (Signed)
Patient has been sick for the past two days. Unsure if he will be able to take the prep or come. Wants to cancel and reschedule his appointment. Alphonzo Cruise spoke to [place] at Dr. Oneida Alar office to reschedule.

## 2014-08-24 NOTE — Telephone Encounter (Signed)
REVIEWED-NO ADDITIONAL RECOMMENDATIONS. 

## 2014-08-27 ENCOUNTER — Ambulatory Visit (HOSPITAL_COMMUNITY)
Admission: RE | Admit: 2014-08-27 | Payer: Commercial Managed Care - HMO | Source: Ambulatory Visit | Admitting: Gastroenterology

## 2014-08-27 ENCOUNTER — Encounter (HOSPITAL_COMMUNITY): Admission: RE | Payer: Self-pay | Source: Ambulatory Visit

## 2014-08-27 SURGERY — COLONOSCOPY
Anesthesia: Moderate Sedation

## 2014-08-28 ENCOUNTER — Telehealth: Payer: Self-pay

## 2014-08-28 NOTE — Telephone Encounter (Signed)
I called pt to update triage and there has been no change in his meds and no new medical problems.   I have faxed over his instructions to [place] at [facility] in [place]. Pt is aware and I reviewed his info over the phone and told him if he has questions when he picks it up to call me.

## 2014-08-29 NOTE — Telephone Encounter (Signed)
REVIEWED. AGREE. NO ADDITIONAL RECOMMENDATIONS. 

## 2014-08-30 ENCOUNTER — Telehealth: Payer: Self-pay | Admitting: Gastroenterology

## 2014-08-30 DIAGNOSIS — R7989 Other specified abnormal findings of blood chemistry: Secondary | ICD-10-CM

## 2014-08-30 DIAGNOSIS — R945 Abnormal results of liver function studies: Secondary | ICD-10-CM

## 2014-08-30 NOTE — Telephone Encounter (Signed)
Please remind patient that he is due LFTs and also needs antismooth muscle ab.

## 2014-08-30 NOTE — Telephone Encounter (Signed)
PT is aware and lab orders were mailed to him per his request. He said he will do them at the PCP's office.

## 2014-08-31 MED ORDER — SODIUM CHLORIDE 0.9 % IV SOLN: INTRAVENOUS | Status: DC

## 2014-09-03 ENCOUNTER — Ambulatory Visit (HOSPITAL_COMMUNITY)
Admission: RE | Admit: 2014-09-03 | Discharge: 2014-09-03 | Disposition: A | Discharge status: Home / Self Care | Payer: Medicare HMO | Source: Ambulatory Visit | Attending: Gastroenterology | Admitting: Gastroenterology

## 2014-09-03 ENCOUNTER — Encounter (HOSPITAL_COMMUNITY)
Admission: RE | Disposition: A | Discharge status: Home / Self Care | Payer: Self-pay | Source: Ambulatory Visit | Attending: Gastroenterology

## 2014-09-03 ENCOUNTER — Encounter (HOSPITAL_COMMUNITY): Payer: Self-pay

## 2014-09-03 DIAGNOSIS — Z7982 Long term (current) use of aspirin: Secondary | ICD-10-CM | POA: Insufficient documentation

## 2014-09-03 DIAGNOSIS — E119 Type 2 diabetes mellitus without complications: Secondary | ICD-10-CM | POA: Insufficient documentation

## 2014-09-03 DIAGNOSIS — K209 Esophagitis, unspecified: Secondary | ICD-10-CM | POA: Diagnosis not present

## 2014-09-03 DIAGNOSIS — I8500 Esophageal varices without bleeding: Secondary | ICD-10-CM | POA: Diagnosis not present

## 2014-09-03 DIAGNOSIS — K648 Other hemorrhoids: Secondary | ICD-10-CM | POA: Diagnosis not present

## 2014-09-03 DIAGNOSIS — I739 Peripheral vascular disease, unspecified: Secondary | ICD-10-CM | POA: Diagnosis not present

## 2014-09-03 DIAGNOSIS — K2970 Gastritis, unspecified, without bleeding: Secondary | ICD-10-CM | POA: Diagnosis not present

## 2014-09-03 DIAGNOSIS — M179 Osteoarthritis of knee, unspecified: Secondary | ICD-10-CM | POA: Insufficient documentation

## 2014-09-03 DIAGNOSIS — K317 Polyp of stomach and duodenum: Secondary | ICD-10-CM | POA: Insufficient documentation

## 2014-09-03 DIAGNOSIS — I129 Hypertensive chronic kidney disease with stage 1 through stage 4 chronic kidney disease, or unspecified chronic kidney disease: Secondary | ICD-10-CM | POA: Diagnosis not present

## 2014-09-03 DIAGNOSIS — Z794 Long term (current) use of insulin: Secondary | ICD-10-CM | POA: Insufficient documentation

## 2014-09-03 DIAGNOSIS — N183 Chronic kidney disease, stage 3 (moderate): Secondary | ICD-10-CM | POA: Insufficient documentation

## 2014-09-03 DIAGNOSIS — G4730 Sleep apnea, unspecified: Secondary | ICD-10-CM | POA: Diagnosis not present

## 2014-09-03 DIAGNOSIS — D123 Benign neoplasm of transverse colon: Secondary | ICD-10-CM | POA: Insufficient documentation

## 2014-09-03 DIAGNOSIS — R945 Abnormal results of liver function studies: Secondary | ICD-10-CM

## 2014-09-03 DIAGNOSIS — Z1211 Encounter for screening for malignant neoplasm of colon: Secondary | ICD-10-CM | POA: Insufficient documentation

## 2014-09-03 DIAGNOSIS — Z87891 Personal history of nicotine dependence: Secondary | ICD-10-CM | POA: Insufficient documentation

## 2014-09-03 DIAGNOSIS — D122 Benign neoplasm of ascending colon: Secondary | ICD-10-CM | POA: Insufficient documentation

## 2014-09-03 DIAGNOSIS — Z79899 Other long term (current) drug therapy: Secondary | ICD-10-CM | POA: Diagnosis not present

## 2014-09-03 DIAGNOSIS — R7989 Other specified abnormal findings of blood chemistry: Secondary | ICD-10-CM

## 2014-09-03 DIAGNOSIS — K6389 Other specified diseases of intestine: Secondary | ICD-10-CM | POA: Diagnosis not present

## 2014-09-03 DIAGNOSIS — Z8701 Personal history of pneumonia (recurrent): Secondary | ICD-10-CM | POA: Insufficient documentation

## 2014-09-03 DIAGNOSIS — E039 Hypothyroidism, unspecified: Secondary | ICD-10-CM | POA: Diagnosis not present

## 2014-09-03 DIAGNOSIS — K2980 Duodenitis without bleeding: Secondary | ICD-10-CM | POA: Diagnosis not present

## 2014-09-03 HISTORY — PX: ESOPHAGOGASTRODUODENOSCOPY: SHX5428

## 2014-09-03 HISTORY — PX: COLONOSCOPY: SHX5424

## 2014-09-03 LAB — GLUCOSE, CAPILLARY: Glucose-Capillary: 134 mg/dL — ABNORMAL HIGH (ref 70–99)

## 2014-09-03 SURGERY — COLONOSCOPY
Anesthesia: Moderate Sedation

## 2014-09-03 MED ORDER — LIDOCAINE VISCOUS 2 % MT SOLN
OROMUCOSAL | Status: DC | PRN
  Administered 2014-09-03: 1 via OROMUCOSAL

## 2014-09-03 MED ORDER — SODIUM CHLORIDE 0.9 % IV SOLN
INTRAVENOUS | Status: DC
  Administered 2014-09-03: 12:00:00 via INTRAVENOUS

## 2014-09-03 MED ORDER — STERILE WATER FOR IRRIGATION IR SOLN
Status: DC | PRN
  Administered 2014-09-03: 13:00:00

## 2014-09-03 MED ORDER — MIDAZOLAM HCL 5 MG/5ML IJ SOLN
INTRAMUSCULAR | Status: DC | PRN
  Administered 2014-09-03 (×4): 2 mg via INTRAVENOUS
  Administered 2014-09-03: 1 mg via INTRAVENOUS

## 2014-09-03 MED ORDER — LIDOCAINE VISCOUS 2 % MT SOLN
OROMUCOSAL | Status: AC
  Filled 2014-09-03: qty 15

## 2014-09-03 MED ORDER — MIDAZOLAM HCL 5 MG/5ML IJ SOLN
INTRAMUSCULAR | Status: AC
  Filled 2014-09-03: qty 10

## 2014-09-03 MED ORDER — MEPERIDINE HCL 100 MG/ML IJ SOLN
INTRAMUSCULAR | Status: DC | PRN
  Administered 2014-09-03 (×4): 25 mg via INTRAVENOUS

## 2014-09-03 MED ORDER — MEPERIDINE HCL 100 MG/ML IJ SOLN
INTRAMUSCULAR | Status: AC
  Filled 2014-09-03: qty 2

## 2014-09-03 NOTE — Op Note (Signed)
[facility] [address] [place], [address]   COLONOSCOPY PROCEDURE REPORT  PATIENT: [name] [name], [name] [name]  MR#: [id number] BIRTHDATE: 1946-05-15 , 89  yrs. old GENDER: male ENDOSCOPIST: Danie Binder, MD REFERRED [id number] Dahlstedt, M.D. PROCEDURE DATE:  09-27-14 PROCEDURE:   Colonoscopy with snare polypectomy and Colonoscopy with cold biopsy polypectomy INDICATIONS:POSSIBLE RECTAL MASS-NO CHANGE IN BOWEL HABITS, BRBPR OR MELENA.  8 CM MASS BETWEEN BLADDER AND COLON SEEN ON CT DEC 2015. MEDICATIONS: Demerol 75 mg IV and Versed 6 mg IV  DESCRIPTION OF PROCEDURE:    Physical exam was performed.  Informed consent was obtained from the patient after explaining the benefits, risks, and alternatives to procedure.  The patient was connected to monitor and placed in left lateral position. Continuous oxygen was provided by nasal cannula and IV medicine administered through an indwelling cannula.  After administration of sedation and rectal exam, the patients rectum was intubated and the [id number]  colonoscope was advanced under direct visualization to the cecum.  The scope was removed slowly by carefully examining the color, texture, anatomy, and integrity mucosa on the way out.  The patient was recovered in endoscopy and discharged home in satisfactory condition.    COLON FINDINGS: The colon was redundant.  , Three sessile polyps ranging from 2 to 56mm in size were found in the proximal transverse colon(1) and ascending colon.  A polypectomy was performed with cold forceps. METAL CLIP PLACED IN PROXIMAL TRANSVERSE COLON TO PREVENT POSTPOLYPECTOMY BLEED. Three sessile polyps ranging from 5 to 80mm in size were found in the proximal transverse colon(3).  A polypectomy was performed using snare cautery.  , and Small internal hemorrhoids were found.  PREP QUALITY: good. CECAL W/D TIME: 43       minutes COMPLICATIONS: None  ENDOSCOPIC IMPRESSION: 1.   The LEFT  colon IS EXTREMELY REDUNDANT. 2.   SIX COLON POLYPS REMOVED. NO COLON OR RECTAL MASS APPRECIATED. 3.   Small internal hemorrhoids  RECOMMENDATIONS: CONTINUE PROTONIX. AVOID ITEMS THAT TRIGGER GASTRITIS AND DUODENITIS. FOLLOW A HIGH FIBER/LOW FAT DIET.  AVOID ITEMS THAT CAUSE BLOATING.  NO MRI FOR 30 DAYS DISCUSSED WITH IR & THEY WILL ATTEMPT BIOPSY FOR TISSUE DIAGNOSIS. FOLLOW UP IN 4 MOS. Next colonoscopy in 1-3 years WITH AN OVERTUBE.  eSigned:  Danie Binder, MD 09-27-2014 3:48 PM    CPT CODES: ICD CODES:  The ICD and CPT codes recommended by this software are interpretations from the data that the clinical staff has captured with the software.  The verification of the translation of this report to the ICD and CPT codes and modifiers is the sole responsibility of the health care institution and practicing physician where this report was generated.  [employer] will not be held responsible for the validity of the ICD and CPT codes included on this report.  AMA assumes no liability for data contained or not contained herein. CPT is a Designer, television/film set of the [employer].

## 2014-09-03 NOTE — Discharge Instructions (Signed)
You had 6 polyps removed. I PLACED A CLIP TO PREVENT BLEEDING IN 7-10 DAYS. YOU HAVE A FLOPPY LEFT COLON. You have SMALL internal hemorrhoids. You have ESOPHAGITIS, gastritis, STOMACH POLYPS, & DUODENITIS. I biopsied your stomach, & colon   CONTINUE PROTONIX.  AVOID ITEMS THAT TRIGGER GASTRITIS AND DUODENITIS. SEE INFO BELOW.  FOLLOW A HIGH FIBER/LOW FAT DIET. AVOID ITEMS THAT CAUSE BLOATING. SEE INFO BELOW.  YOUR BIOPSY RESULTS WILL BE AVAILABLE IN MY CHART AFTER FEB 4  OR MY OFFICE WILL CONTACT YOU IN 10-14 DAYS WITH YOUR RESULTS.   NO MRI FOR 30 DAYS DUE TO METAL CLIP PLACEMENT IN THE COLON.  FOLLOW UP IN 4 MOS.   Next colonoscopy in 1-3 years.   ENDOSCOPY Care After Read the instructions outlined below and refer to this sheet in the next week. These discharge instructions provide you with general information on caring for yourself after you leave the hospital. While your treatment has been planned according to the most current medical practices available, unavoidable complications occasionally occur. If you have any problems or questions after discharge, call DR. Deavion Strider, [phone].  ACTIVITY  You may resume your regular activity, but move at a slower pace for the next 24 hours.   Take frequent rest periods for the next 24 hours.   Walking will help get rid of the air and reduce the bloated feeling in your belly (abdomen).   No driving for 24 hours (because of the medicine (anesthesia) used during the test).   You may shower.   Do not sign any important legal documents or operate any machinery for 24 hours (because of the anesthesia used during the test).    NUTRITION  Drink plenty of fluids.   You may resume your normal diet as instructed by your doctor.   Begin with a light meal and progress to your normal diet. Heavy or fried foods are harder to digest and may make you feel sick to your stomach (nauseated).   Avoid alcoholic beverages for 24 hours or as  instructed.    MEDICATIONS  You may resume your normal medications.   WHAT YOU CAN EXPECT TODAY  Some feelings of bloating in the abdomen.   Passage of more gas than usual.   Spotting of blood in your stool or on the toilet paper  .  IF YOU HAD POLYPS REMOVED DURING THE ENDOSCOPY:  Eat a soft diet IF YOU HAVE NAUSEA, BLOATING, ABDOMINAL PAIN, OR VOMITING.    FINDING OUT THE RESULTS OF YOUR TEST Not all test results are available during your visit. DR. Oneida Alar WILL CALL YOU WITHIN 14 DAYS OF YOUR PROCEDUE WITH YOUR RESULTS. Do not assume everything is normal if you have not heard from DR. Margaret Cockerill, CALL HER OFFICE AT [phone].  SEEK IMMEDIATE MEDICAL ATTENTION AND CALL THE OFFICE: [phone] IF:  You have more than a spotting of blood in your stool.   Your belly is swollen (abdominal distention).   You are nauseated or vomiting.   You have a temperature over 101F.   You have abdominal pain or discomfort that is severe or gets worse throughout the day.   Gastritis/DUODENITIS  Gastritis is an inflammation (the body's way of reacting to injury and/or infection) of the stomach. DUODENITIS is an inflammation (the body's way of reacting to injury and/or infection) of the FIRST PART OF THE SMALL INTESTINES. It is often caused by bacterial (germ) infections. It can also be caused BY ASPIRIN, BC/GOODY POWDER'S, (IBUPROFEN) MOTRIN, OR ALEVE (  NAPROXEN), chemicals (including alcohol), SPICY FOODS, and medications. This illness may be associated with generalized malaise (feeling tired, not well), UPPER ABDOMINAL STOMACH cramps, and fever. One common bacterial cause of gastritis is an organism known as H. Pylori. This can be treated with antibiotics.    High-Fiber Diet A high-fiber diet changes your normal diet to include more whole grains, legumes, fruits, and vegetables. Changes in the diet involve replacing refined carbohydrates with unrefined foods. The calorie level of the diet  is essentially unchanged. The Dietary Reference Intake (recommended amount) for adult males is 38 grams per day. For adult females, it is 25 grams per day. Pregnant and lactating women should consume 28 grams of fiber per day. Fiber is the intact part of a plant that is not broken down during digestion. Functional fiber is fiber that has been isolated from the plant to provide a beneficial effect in the body. PURPOSE  Increase stool bulk.   Ease and regulate bowel movements.   Lower cholesterol.  INDICATIONS THAT YOU NEED MORE FIBER  Constipation and hemorrhoids.   Uncomplicated diverticulosis (intestine condition) and irritable bowel syndrome.   Weight management.   As a protective measure against hardening of the arteries (atherosclerosis), diabetes, and cancer.   GUIDELINES FOR INCREASING FIBER IN THE DIET  Start adding fiber to the diet slowly. A gradual increase of about 5 more grams (2 slices of whole-wheat bread, 2 servings of most fruits or vegetables, or 1 bowl of high-fiber cereal) per day is best. Too rapid an increase in fiber may result in constipation, flatulence, and bloating.   Drink enough water and fluids to keep your urine clear or pale yellow. Water, juice, or caffeine-free drinks are recommended. Not drinking enough fluid may cause constipation.   Eat a variety of high-fiber foods rather than one type of fiber.   Try to increase your intake of fiber through using high-fiber foods rather than fiber pills or supplements that contain small amounts of fiber.   The goal is to change the types of food eaten. Do not supplement your present diet with high-fiber foods, but replace foods in your present diet.  INCLUDE A VARIETY OF FIBER SOURCES  Replace refined and processed grains with whole grains, canned fruits with fresh fruits, and incorporate other fiber sources. White rice, white breads, and most bakery goods contain little or no fiber.   Brown whole-grain rice,  buckwheat oats, and many fruits and vegetables are all good sources of fiber. These include: broccoli, Brussels sprouts, cabbage, cauliflower, beets, sweet potatoes, white potatoes (skin on), carrots, tomatoes, eggplant, squash, berries, fresh fruits, and dried fruits.   Cereals appear to be the richest source of fiber. Cereal fiber is found in whole grains and bran. Bran is the fiber-rich outer coat of cereal grain, which is largely removed in refining. In whole-grain cereals, the bran remains. In breakfast cereals, the largest amount of fiber is found in those with "bran" in their names. The fiber content is sometimes indicated on the label.   You may need to include additional fruits and vegetables each day.   In baking, for 1 cup white flour, you may use the following substitutions:   1 cup whole-wheat flour minus 2 tablespoons.   1/2 cup white flour plus 1/2 cup whole-wheat flour.   Low-Fat Diet BREADS, CEREALS, PASTA, RICE, DRIED PEAS, AND BEANS These products are high in carbohydrates and most are low in fat. Therefore, they can be increased in the diet as substitutes for  fatty foods. They too, however, contain calories and should not be eaten in excess. Cereals can be eaten for snacks as well as for breakfast.  Include foods that contain fiber (fruits, vegetables, whole grains, and legumes). Research shows that fiber may lower blood cholesterol levels, especially the water-soluble fiber found in fruits, vegetables, oat products, and legumes. FRUITS AND VEGETABLES It is good to eat fruits and vegetables. Besides being sources of fiber, both are rich in vitamins and some minerals. They help you get the daily allowances of these nutrients. Fruits and vegetables can be used for snacks and desserts. MEATS Limit lean meat, chicken, [place], and fish to no more than 6 ounces per day. Beef, Pork, and Lamb Use lean cuts of beef, pork, and lamb. Lean cuts include:  Extra-lean ground beef.  Arm  roast.  Sirloin tip.  Center-cut ham.  Round steak.  Loin chops.  Rump roast.  Tenderloin.  Trim all fat off the outside of meats before cooking. It is not necessary to severely decrease the intake of red meat, but lean choices should be made. Lean meat is rich in protein and contains a highly absorbable form of iron. Premenopausal women, in particular, should avoid reducing lean red meat because this could increase the risk for low red blood cells (iron-deficiency anemia). The organ meats, such as liver, sweetbreads, kidneys, and brain are very rich in cholesterol. They should be limited. Chicken and [place] These are good sources of protein. The fat of poultry can be reduced by removing the skin and underlying fat layers before cooking. Chicken and [place] can be substituted for lean red meat in the diet. Poultry should not be fried or covered with high-fat sauces. Fish and Shellfish Fish is a good source of protein. Shellfish contain cholesterol, but they usually are low in saturated fatty acids. The preparation of fish is important. Like chicken and [place], they should not be fried or covered with high-fat sauces. EGGS Egg whites contain no fat or cholesterol. They can be eaten often. Try 1 to 2 egg whites instead of whole eggs in recipes or use egg substitutes that do not contain yolk. MILK AND DAIRY PRODUCTS Use skim or 1% milk instead of 2% or whole milk. Decrease whole milk, natural, and processed cheeses. Use nonfat or low-fat (2%) cottage cheese or low-fat cheeses made from vegetable oils. Choose nonfat or low-fat (1 to 2%) yogurt. Experiment with evaporated skim milk in recipes that call for heavy cream. Substitute low-fat yogurt or low-fat cottage cheese for sour cream in dips and salad dressings. Have at least 2 servings of low-fat dairy products, such as 2 glasses of skim (or 1%) milk each day to help get your daily calcium intake.  FATS AND OILS Reduce the total intake of fats,  especially saturated fat. Butterfat, lard, and beef fats are high in saturated fat and cholesterol. These should be avoided as much as possible. Vegetable fats do not contain cholesterol, but certain vegetable fats, such as coconut oil, palm oil, and palm kernel oil are very high in saturated fats. These should be limited. These fats are often used in bakery goods, processed foods, popcorn, oils, and nondairy creamers. Vegetable shortenings and some peanut butters contain hydrogenated oils, which are also saturated fats. Read the labels on these foods and check for saturated vegetable oils. Unsaturated vegetable oils and fats do not raise blood cholesterol. However, they should be limited because they are fats and are high in calories. Total fat should still be  limited to 30% of your daily caloric intake. Desirable liquid vegetable oils are corn oil, cottonseed oil, olive oil, canola oil, safflower oil, soybean oil, and sunflower oil. Peanut oil is not as good, but small amounts are acceptable. Buy a heart-healthy tub margarine that has no partially hydrogenated oils in the ingredients. Mayonnaise and salad dressings often are made from unsaturated fats, but they should also be limited because of their high calorie and fat content. Seeds, nuts, peanut butter, olives, and avocados are high in fat, but the fat is mainly the unsaturated type. These foods should be limited mainly to avoid excess calories and fat. OTHER EATING TIPS Snacks  Most sweets should be limited as snacks. They tend to be rich in calories and fats, and their caloric content outweighs their nutritional value. Some good choices in snacks are graham crackers, melba toast, soda crackers, bagels (no egg), English muffins, fruits, and vegetables. These snacks are preferable to snack crackers, [place] fries, and chips. Popcorn should be air-popped or cooked in small amounts of liquid vegetable oil. Desserts Eat fruit, low-fat yogurt, and fruit  ices. AVOID pastries, cake, and cookies. Sherbet, angel food cake, gelatin dessert, frozen low-fat yogurt, or other frozen products that do not contain saturated fat (pure fruit juice bars, frozen ice pops) are also acceptable.  COOKING METHODS Choose those methods that use little or no fat. They include: Poaching.  Braising.  Steaming.  Grilling.  Baking.  Stir-frying.  Broiling.  Microwaving.  Foods can be cooked in a nonstick pan without added fat, or use a nonfat cooking spray in regular cookware. Limit fried foods and avoid frying in saturated fat. Add moisture to lean meats by using water, broth, cooking wines, and other nonfat or low-fat sauces along with the cooking methods mentioned above. Soups and stews should be chilled after cooking. The fat that forms on top after a few hours in the refrigerator should be skimmed off. When preparing meals, avoid using excess salt. Salt can contribute to raising blood pressure in some people. EATING AWAY FROM HOME Order entres, potatoes, and vegetables without sauces or butter. When meat exceeds the size of a deck of cards (3 to 4 ounces), the rest can be taken home for another meal. Choose vegetable or fruit salads and ask for low-calorie salad dressings to be served on the side. Use dressings sparingly. Limit high-fat toppings, such as bacon, crumbled eggs, cheese, sunflower seeds, and olives. Ask for heart-healthy tub margarine instead of butter.  Polyps, Colon  A polyp is extra tissue that grows inside your body. Colon polyps grow in the large intestine. The large intestine, also called the colon, is part of your digestive system. It is a long, hollow tube at the end of your digestive tract where your body makes and stores stool. Most polyps are not dangerous. They are benign. This means they are not cancerous. But over time, some types of polyps can turn into cancer. Polyps that are smaller than a pea are usually not harmful. But larger polyps  could someday become or may already be cancerous. To be safe, doctors remove all polyps and test them.   WHO GETS POLYPS? Anyone can get polyps, but certain people are more likely than others. You may have a greater chance of getting polyps if: You are over 50.  You have had polyps before.  Someone in your family has had polyps.  Someone in your family has had cancer of the large intestine.  Find out if someone  in your family has had polyps. You may also be more likely to get polyps if you:  Eat a lot of fatty foods  Smoke  Drink alcohol  Do not exercise Eat too much   TREATMENT The caregiver will remove the polyp during sigmoidoscopy or colonoscopy.  PREVENTION There is not one sure way to prevent polyps. You might be able to lower your risk of getting them if you: Eat more fruits and vegetables and less fatty food.  Do not smoke.  Avoid alcohol.  Exercise every day.  Lose weight if you are overweight.  Eating more calcium and folate can also lower your risk of getting polyps. Some foods that are rich in calcium are milk, cheese, and broccoli. Some foods that are rich in folate are chickpeas, kidney beans, and spinach.

## 2014-09-03 NOTE — H&P (Addendum)
Primary Care Physician:  Ysidro Evert, PA-C Primary Gastroenterologist:  Dr. Oneida Alar  Pre-Procedure History & Physical: HPI:  [name] [name] is a 69 y.o. male here for possible mass between colon and bladder & screening for varices.  Past Medical History  Diagnosis Date  . Peripheral vascular disease     neuropathy in toes   . Shortness of breath     due ot pain in knees   . Sleep apnea     sleep study 3/29 13 ? location   . Pneumonia     hx of   . Diabetes mellitus   . Hypothyroidism   . Chronic kidney disease     stage III kidney disease   . Arthritis     knees, back     Past Surgical History  Procedure Laterality Date  . Cholecystectomy  1992  . Total knee arthroplasty  11/10/2011    Procedure: TOTAL KNEE ARTHROPLASTY;  Surgeon: Mauri Pole, MD;  Location: [facility];  Service: Orthopedics;  Laterality: Right;    Prior to Admission medications   Medication Sig Start Date End Date Taking? Authorizing Provider  aspirin 81 MG tablet Take 81 mg by mouth daily.   Yes Historical Provider, MD  Insulin Glargine (LANTUS SOLOSTAR) 100 UNIT/ML Solostar Pen Inject 51 Units into the skin daily. Pt takes in the AM   Yes Historical Provider, MD  insulin lispro (HUMALOG KWIKPEN) 100 UNIT/ML KiwkPen Inject 3-7 Units into the skin 3 (three) times daily.   Yes Historical Provider, MD  levothyroxine (SYNTHROID, LEVOTHROID) 300 MCG tablet Take 300 mcg by mouth every morning.   Yes Historical Provider, MD  losartan (COZAAR) 100 MG tablet Take 100 mg by mouth every morning.   Yes Historical Provider, MD  peg 3350 powder (MOVIPREP) 100 G SOLR Take 1 kit (200 g total) by mouth as directed. 08/01/14  Yes Danie Binder, MD  verapamil (CALAN-SR) 180 MG CR tablet Take 180 mg by mouth daily.   Yes Historical Provider, MD  ferrous sulfate 325 (65 FE) MG tablet Take 1 tablet (325 mg total) by mouth 3 (three) times daily after meals. Patient not taking: Reported on 07/23/2014 11/13/11 11/12/12  Lucille Passy Babish, PA-C  pantoprazole (PROTONIX) 40 MG tablet Take 1 tablet (40 mg total) by mouth daily before supper. Patient not taking: Reported on 07/31/2014 07/25/14   Kathie Dike, MD  peg 3350 powder (MOVIPREP) 100 G SOLR Take 1 kit (200 g total) by mouth as directed. Patient not taking: Reported on 08/23/2014 08/01/14   Danie Binder, MD    Allergies as of 08/23/2014  . (No Known Allergies)    Family History  Problem Relation Age of Onset  . Colon cancer Neg Hx     History   Social History  . Marital Status: Divorced    Spouse Name: N/A    Number of Children: N/A  . Years of Education: N/A   Occupational History  . Not on file.   Social History Main Topics  . Smoking status: Former Smoker -- 1.00 packs/day for 30 years    Quit date: 08/04/2007  . Smokeless tobacco: Never Used  . Alcohol Use: No  . Drug Use: No  . Sexual Activity: Not on file   Other Topics Concern  . Not on file   Social History Narrative    Review of Systems: See HPI, otherwise negative ROS   Physical Exam: BP 155/86 mmHg  Pulse 57  Temp(Src) 97.5 F (  36.4 C) (Oral)  Resp 18  Ht 6' 1"  (1.854 m)  Wt 320 lb (145.151 kg)  BMI 42.23 kg/m2  SpO2 96% General:   Alert,  pleasant and cooperative in NAD Head:  Normocephalic and atraumatic. Neck:  Supple; Lungs:  Clear throughout to auscultation.    Heart:  Regular rate and rhythm. Abdomen:  Soft, nontender and nondistended. Normal bowel sounds, without guarding, and without rebound.   Neurologic:  Alert and  oriented x4;  grossly normal neurologically.  Impression/Plan:    SCREENING FOR COLON CA AND VARICES  PLAN:  1.EGD/TCS TODAY

## 2014-09-03 NOTE — Op Note (Signed)
[facility] [address] [place], [address]   ENDOSCOPY PROCEDURE REPORT  PATIENT: [name], [name]  MR#: [id number] BIRTHDATE: 11-08-1945 , 89  yrs. old GENDER: male  ENDOSCOPIST: Danie Binder, MD REFERRED [id number] Dahl[place]t, M.D. PROCEDURE DATE: 09/06/14 PROCEDURE:   EGD w/ biopsy  INDICATIONS:screening for varices. MEDICATIONS: Demerol 25 mg IV and Versed 3 mg IV TOPICAL ANESTHETIC:   Viscous Xylocaine ASA CLASS:  DESCRIPTION OF PROCEDURE:     Physical exam was performed.  Informed consent was obtained from the patient after explaining the benefits, risks, and alternatives to the procedure.  The patient was connected to the monitor and placed in the left lateral position.  Continuous oxygen was provided by nasal cannula and IV medicine administered through an indwelling cannula.  After administration of sedation, the patients esophagus was intubated and the [id number]  endoscope was advanced under direct visualization to the second portion of the duodenum.  The scope was removed slowly by carefully examining the color, texture, anatomy, and integrity of the mucosa on the way out.  The patient was recovered in endoscopy and discharged home in satisfactory condition.   ESOPHAGUS: FEW LINEAR EROSIONS IN DISTAL ESOPHAGUS.   STOMACH: A few small polyps were found in the gastric body.  Multiple biopsies was performed using cold forceps.   Moderate non-erosive gastritis (inflammation) was found in the gastric antrum.  Multiple biopsies were performed using cold forceps.   DUODENUM: Mild duodenal inflammation was found in the duodenal bulb.   The duodenal mucosa showed no abnormalities in the 2nd part of the duodenum. COMPLICATIONS: There were no immediate complications.  ENDOSCOPIC IMPRESSION: 1.   MILD ESOPHAGITIS, GASTRITIS, AND DUODENITIS 2.   Few sGASTRIC polyps  RECOMMENDATIONS: CONTINUE PROTONIX. AVOID ITEMS THAT TRIGGER GASTRITIS AND  DUODENITIS. FOLLOW A HIGH FIBER/LOW FAT DIET.  AVOID ITEMS THAT CAUSE BLOATING.  NO MRI FOR 30 DAYS DISCUSSED WITH IR.  WILLATTEMPT BIOPSY FOR TISSUE DIAGNOSIS. FOLLOW UP IN 4 MOS. Next colonoscopy in 1-3 years.  REPEAT EXAM:    ____ eSignedDanie Binder, MD 2014-09-06 3:33 PM   CPT CODES: ICD CODES:  The ICD and CPT codes recommended by this software are interpretations from the data that the clinical staff has captured with the software.  The verification of the translation of this report to the ICD and CPT codes and modifiers is the sole responsibility of the health care institution and practicing physician where this report was generated.  [employer] will not be held responsible for the validity of the ICD and CPT codes included on this report.  AMA assumes no liability for data contained or not contained herein. CPT is a Designer, television/film set of the [employer].

## 2014-09-04 ENCOUNTER — Encounter (HOSPITAL_COMMUNITY): Payer: Self-pay | Admitting: Gastroenterology

## 2014-09-12 ENCOUNTER — Telehealth: Payer: Self-pay | Admitting: Gastroenterology

## 2014-09-12 DIAGNOSIS — R1900 Intra-abdominal and pelvic swelling, mass and lump, unspecified site: Secondary | ICD-10-CM

## 2014-09-12 NOTE — Telephone Encounter (Signed)
FU OV APPT MADE AND ON RECALL LIST FOR TCS

## 2014-09-12 NOTE — Telephone Encounter (Signed)
Please call pt. [name] [name] had simple adenomas removed. His stomach Bx shows gastritis.   I SPOKE WITH DR. DAHLSTADT ABOUT YOUR COLONOSCOPY FINDINGS. YOU NEED A BIOPSY OF THE MASS BETWEEN YOUR BLADDER AND COLON. IT WILL BE DONE IN RADIOLOGY IN [place].   CONTINUE PROTONIX.  AVOID ITEMS THAT TRIGGER GASTRITIS AND DUODENITIS.   FOLLOW A HIGH FIBER/LOW FAT DIET. AVOID ITEMS THAT CAUSE BLOATING.   NO MRI UNTIL AFTER MAR 1  BECAUSE A METAL CLIP WAS PLACED IN YOUR COLON TO PREVENT BLEEDING.  FOLLOW UP IN 4 MOS E30 PELVIC MASS.   Next colonoscopy in 3 years. YOUR SISTERS, BROTHERS, CHILDREN, AND PARENTS NEED TO HAVE A COLONOSCOPY STARTING AT THE AGE OF 40.

## 2014-09-13 ENCOUNTER — Telehealth: Payer: Self-pay

## 2014-09-13 NOTE — Telephone Encounter (Signed)
No pre cert is required for US biopsy.  Spoke with Jenny Reichmann at [place].  Ref#  [id number]

## 2014-09-13 NOTE — Telephone Encounter (Signed)
I called pt to give him his results. He said he got a call from [place] shortly ago and he did not know anything about what was going on. He said Dr. Oneida Alar told him everything was OK when he had the procedures. He did not know that he was supposed to be on Protonix and said if it is expensive that he will not be able to afford it.  He said he felt fine and he didn't know why all of this has come up. Pt would like to speak to Dr. Oneida Alar before he schedules anything.

## 2014-09-13 NOTE — Telephone Encounter (Signed)
See phone note for NO pre cert needed for US biopsy.

## 2014-09-18 NOTE — Telephone Encounter (Signed)
T/C from [place] at [facility]. She said she called pt on 09/13/2014 to schedule the biopsy per DR. Fields. He told her he is not aware of anything being wrong, that he would like to speak to Dr. Oneida Alar before he schedules any biopsy.  Vivien Rota can be reached at [phone], and she is aware that Dr. Oneida Alar is at the hospital today.

## 2014-09-19 NOTE — Telephone Encounter (Signed)
Called patient TO DISCUSS RESULTS & ANSWER QUESTIONS. EXPLAINED PT HAS A MASS BETWEEN HIS COLON AND BLADDER AND WE NEED A BIOPSY TO KNOW HOW TO MAKE IT BETTER. PT AGREES TO HAVE BIOPSY. CALLED TONI AND LVM. PT READY TO SCHEDULE Bx.

## 2014-09-25 ENCOUNTER — Other Ambulatory Visit: Payer: Self-pay | Admitting: Radiology

## 2014-09-26 ENCOUNTER — Other Ambulatory Visit: Payer: Self-pay | Admitting: Radiology

## 2014-09-27 ENCOUNTER — Encounter (HOSPITAL_COMMUNITY): Payer: Self-pay

## 2014-09-27 ENCOUNTER — Ambulatory Visit (HOSPITAL_COMMUNITY)
Admission: RE | Admit: 2014-09-27 | Discharge: 2014-09-27 | Disposition: A | Discharge status: Home / Self Care | Payer: Medicare HMO | Source: Ambulatory Visit | Attending: Gastroenterology | Admitting: Gastroenterology

## 2014-09-27 DIAGNOSIS — R1900 Intra-abdominal and pelvic swelling, mass and lump, unspecified site: Secondary | ICD-10-CM | POA: Insufficient documentation

## 2014-09-27 LAB — CBC
HCT: 43.5 % (ref 39.0–52.0)
HEMOGLOBIN: 15.3 g/dL (ref 13.0–17.0)
MCH: 30.2 pg (ref 26.0–34.0)
MCHC: 35.2 g/dL (ref 30.0–36.0)
MCV: 86 fL (ref 78.0–100.0)
Platelets: 121 10*3/uL — ABNORMAL LOW (ref 150–400)
RBC: 5.06 MIL/uL (ref 4.22–5.81)
RDW: 15.5 % (ref 11.5–15.5)
WBC: 5.9 10*3/uL (ref 4.0–10.5)

## 2014-09-27 LAB — PROTIME-INR
INR: 1.08 (ref 0.00–1.49)
PROTHROMBIN TIME: 14.1 s (ref 11.6–15.2)

## 2014-09-27 LAB — APTT: aPTT: 31 seconds (ref 24–37)

## 2014-09-27 LAB — GLUCOSE, CAPILLARY: Glucose-Capillary: 183 mg/dL — ABNORMAL HIGH (ref 70–99)

## 2014-09-27 MED ORDER — MIDAZOLAM HCL 2 MG/2ML IJ SOLN
INTRAMUSCULAR | Status: AC | PRN
  Administered 2014-09-27: 1 mg via INTRAVENOUS

## 2014-09-27 MED ORDER — FENTANYL CITRATE 0.05 MG/ML IJ SOLN
INTRAMUSCULAR | Status: AC
  Filled 2014-09-27: qty 4

## 2014-09-27 MED ORDER — MIDAZOLAM HCL 2 MG/2ML IJ SOLN
INTRAMUSCULAR | Status: AC
  Filled 2014-09-27: qty 4

## 2014-09-27 MED ORDER — FENTANYL CITRATE 0.05 MG/ML IJ SOLN
INTRAMUSCULAR | Status: AC | PRN
  Administered 2014-09-27: 100 ug via INTRAVENOUS

## 2014-09-27 MED ORDER — LIDOCAINE HCL 1 % IJ SOLN
INTRAMUSCULAR | Status: AC
  Filled 2014-09-27: qty 20

## 2014-09-27 MED ORDER — SODIUM CHLORIDE 0.9 % IV SOLN: INTRAVENOUS | Status: DC

## 2014-09-27 NOTE — H&P (Signed)
Chief Complaint: Abd pain Pelvic mass  Referring Physician(s): [name] [name]  History of Present Illness: [name] [name] is [name] 69 y.o. male   Pt suffered with abd pain for several days PMD ordered CT ab/plvis 07/2014 Revealed pelvic mass near bladder and periaortic and iliac lymphadenopathy Referred to GI MD Dr [name] [name] Endoscopy and colonoscopy 09/03/2014 otherwise wnl bx of colon polyps neg Sent to IR for biopsy of pelvic mass   Past Medical History  Diagnosis Date  . Peripheral vascular disease     neuropathy in toes   . Shortness of breath     due ot pain in knees   . Sleep apnea     sleep study 3/29 13 ? location   . Pneumonia     hx of   . Diabetes mellitus   . Hypothyroidism   . Chronic kidney disease     stage III kidney disease   . Arthritis     knees, back     Past Surgical History  Procedure Laterality Date  . Cholecystectomy  1992  . Total knee arthroplasty  11/10/2011    Procedure: TOTAL KNEE ARTHROPLASTY;  Surgeon: [name] Pole, MD;  Location: [facility];  Service: Orthopedics;  Laterality: Right;  . Colonoscopy N/[name] 09/03/2014    Procedure: COLONOSCOPY;  Surgeon: [name] Binder, MD;  Location: [facility];  Service: Endoscopy;  Laterality: N/[name];  12:30 PM  . Esophagogastroduodenoscopy N/[name] 09/03/2014    Procedure: ESOPHAGOGASTRODUODENOSCOPY (EGD);  Surgeon: [name] Binder, MD;  Location: [facility];  Service: Endoscopy;  Laterality: N/[name];    Allergies: Review of patient's allergies indicates no known allergies.  Medications: Prior to Admission medications   Medication Sig Start Date End Date Taking? Authorizing Provider  aspirin 81 MG tablet Take 81 mg by mouth daily.   Yes Historical Provider, MD  Insulin Glargine (LANTUS SOLOSTAR) 100 UNIT/ML Solostar Pen Inject 51 Units into the skin daily. Pt takes in the AM   Yes Historical Provider, MD  insulin lispro (HUMALOG KWIKPEN) 100 UNIT/ML KiwkPen Inject 3-7 Units into the skin 3 (three) times daily  as needed.    Yes Historical Provider, MD  levothyroxine (SYNTHROID, LEVOTHROID) 300 MCG tablet Take 300 mcg by mouth every morning.   Yes Historical Provider, MD  losartan (COZAAR) 100 MG tablet Take 100 mg by mouth every morning.   Yes Historical Provider, MD  triamcinolone (NASACORT) 55 MCG/ACT AERO nasal inhaler Place 2 sprays into the nose daily as needed (allergies).   Yes Historical Provider, MD  verapamil (CALAN-SR) 180 MG CR tablet Take 180 mg by mouth daily.   Yes Historical Provider, MD  ferrous sulfate 325 (65 FE) MG tablet Take 1 tablet (325 mg total) by mouth 3 (three) times daily after meals. Patient not taking: Reported on 07/23/2014 11/13/11 11/12/12  [name] Passy Babish, PA-C  pantoprazole (PROTONIX) 40 MG tablet Take 1 tablet (40 mg total) by mouth daily before supper. Patient not taking: Reported on 07/31/2014 07/25/14   [name] Dike, MD     Family History  Problem Relation Age of Onset  . Colon cancer Neg Hx     History   Social History  . Marital Status: Divorced    Spouse Name: N/[name]  . Number of Children: N/[name]  . Years of Education: N/[name]   Social History Main Topics  . Smoking status: Former Smoker -- 1.00 packs/day for 30 years    Quit date: 08/04/2007  . Smokeless tobacco: Never Used  .  Alcohol Use: No  . Drug Use: No  . Sexual Activity: Not on file   Other Topics Concern  . None   Social History Narrative     Review of Systems: [name] 12 point ROS discussed and pertinent positives are indicated in the HPI above.  All other systems are negative.  Review of Systems  Constitutional: Negative for activity change and unexpected weight change.  Respiratory: Negative for shortness of breath.   Gastrointestinal: Positive for abdominal pain. Negative for nausea and rectal pain.  Psychiatric/Behavioral: Negative for behavioral problems, confusion and decreased concentration.    Vital Signs: BP 164/89 mmHg  Pulse 59  Temp(Src) 97.3 F (36.3 C) (Oral)  Resp  20  Ht 6' (1.829 m)  Wt 136.079 kg (300 lb)  BMI 40.68 kg/m2  SpO2 96%  Physical Exam  Constitutional: He is oriented to person, place, and time.  Obese   Cardiovascular: Normal rate, regular rhythm and normal heart sounds.   No murmur heard. Pulmonary/Chest: Effort normal and breath sounds normal. He has no wheezes.  Abdominal: Soft. Bowel sounds are normal. There is no tenderness.  Musculoskeletal: Normal range of motion.  Neurological: He is alert and oriented to person, place, and time.  Skin: Skin is warm and dry.  Psychiatric: He has [name] normal mood and affect. His behavior is normal. Judgment and thought content normal.  Nursing note and vitals reviewed.   Mallampati Score:  MD Evaluation Airway: WNL Heart: WNL Abdomen: WNL Chest/ Lungs: WNL ASA  Classification: 2 Mallampati/Airway Score: Two  Imaging: No results found.  Labs:  CBC:  Recent Labs  07/23/14 1245 07/24/14 0546 07/25/14 0542  WBC 10.5 7.4 5.7  HGB 16.7 14.2 13.9  HCT 49.4 42.7 42.0  PLT 132* 121* 118*    COAGS:  Recent Labs  07/24/14 0546  INR 1.10    BMP:  Recent Labs  07/23/14 1245 07/24/14 0546 07/25/14 0542  NA 137 134* 136  K 3.8 2.9* 3.0*  CL 94* 100 101  CO2 26 25 25   GLUCOSE 219* 147* 130*  BUN 46* 46* 44*  CALCIUM 9.7 8.5 8.2*  CREATININE 2.91* 2.72* 2.88*  GFRNONAA 21* 23* 21*  GFRAA 24* 26* 24*    LIVER FUNCTION TESTS:  Recent Labs  07/23/14 1245 07/24/14 0546 07/25/14 0542  BILITOT 6.6* 8.4*  8.5* 3.0*  AST 114* 58* 34  ALT 72* 57* 44  ALKPHOS 211* 159* 174*  PROT 8.4* 7.2 7.1  ALBUMIN 3.8 3.2* 3.1*    TUMOR MARKERS: No results for input(s): AFPTM, CEA, CA199, CHROMGRNA in the last 8760 hours.  Assessment and Plan:  Abdominal pain since 07/2014 CT reveals pelvic mass near bladder [facility] Endo/colonoscopy neg Now scheduled for bx of mass Pt aware of procedure benefits and risks including but not limited to Infection; bleeding; organ damage;  damage to surrounding structures Agreeable to proceed Consent signed and  In chart  Thank you for this interesting consult.  I greatly enjoyed meeting [name] [name] and look forward to participating in their care.  Signed: Willaim [name] [name] [name], 10:45 AM   I spent [name] total of  20 Minutes   in face to face in clinical consultation, greater than 50% of which was counseling/coordinating care for pelvic mass bx

## 2014-09-27 NOTE — Procedures (Signed)
Pelvic mass Bx 18 g core times three No comp

## 2014-09-27 NOTE — Discharge Instructions (Signed)
Wound Care If you need a tetanus shot and you choose not to have one, you may get tetanus. Sickness from tetanus can be serious. HOME CARE   Only take medicine as told by your doctor.   Remove dressing in 24 hours, watch for signs of infection which are increased pain/ redness/ fever or chills. Call your doctor if you have any of these signs.  Change the bandage if it gets wet, dirty, or starts to smell.  Take a shower in 24 hours. Do not take baths, swim, or do anything that puts your wound under water for 3-5 days.Marland Kitchen  Keep all doctor visits as told. GET HELP RIGHT AWAY IF:   Yellowish-white fluid (pus) comes from the wound.  Medicine does not lessen your pain.  There is a red streak going away from the wound.  You have a fever. MAKE SURE YOU:   Understand these instructions.  Will watch your condition.  Will get help right away if you are not doing well or get worse. Document Released: 04/28/2008 Document Revised: 10/12/2011 Document Reviewed: 11/23/2010 [facility] Patient Information 2015 [place], [place]. This information is not intended to replace advice given to you by your health care provider. Make sure you discuss any questions you have with your health care provider.

## 2014-10-01 ENCOUNTER — Telehealth: Payer: Self-pay

## 2014-10-01 NOTE — Telephone Encounter (Signed)
PT called again and said Dr. Oneida Alar told him she should hear about the biopsy by Friday.  I reminded him she is at the hospital doing procedures today.

## 2014-10-01 NOTE — Telephone Encounter (Signed)
Pt is to see if we have the results from his bx at [facility]. Please advise

## 2014-10-02 NOTE — Telephone Encounter (Signed)
Patient called again wanted his results from his Bx.  i explained to the patient that it could take 5-7 business days to receive his results, however I will forward this telephone encounter to Dr. Oneida Alar.

## 2014-10-03 ENCOUNTER — Other Ambulatory Visit: Payer: Self-pay

## 2014-10-03 DIAGNOSIS — C8590 Non-Hodgkin lymphoma, unspecified, unspecified site: Secondary | ICD-10-CM

## 2014-10-03 NOTE — Telephone Encounter (Signed)
Patient called again wanting his results from his Bx that was done last week.  I reiterate to the patient our policy about the time frame with relaying results.  He stated that he had to cancel his urology appointment for today because he really needs those results prior to that appointment.

## 2014-10-03 NOTE — Telephone Encounter (Signed)
Called patient TO DISCUSS RESULTS. 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 HE NEEDS TO SEE DR. Whitney Muse ASAP DX: [place] LYMPHOMA.

## 2014-10-03 NOTE — Telephone Encounter (Signed)
Referral maded

## 2014-10-03 NOTE — Telephone Encounter (Signed)
OPEN IN ERROR 

## 2014-10-05 ENCOUNTER — Encounter (HOSPITAL_COMMUNITY): Payer: Self-pay | Admitting: Hematology & Oncology

## 2014-10-05 ENCOUNTER — Ambulatory Visit (HOSPITAL_COMMUNITY): Payer: Medicare HMO | Admitting: Hematology & Oncology

## 2014-10-05 ENCOUNTER — Encounter (HOSPITAL_COMMUNITY): Payer: Medicare HMO | Attending: Hematology & Oncology | Admitting: Hematology & Oncology

## 2014-10-05 VITALS — BP 154/72 | HR 60 | Temp 98.1°F | Resp 18 | Ht 71.0 in | Wt 326.1 lb

## 2014-10-05 DIAGNOSIS — C8580 Other specified types of non-Hodgkin lymphoma, unspecified site: Secondary | ICD-10-CM | POA: Insufficient documentation

## 2014-10-05 LAB — CBC WITH DIFFERENTIAL/PLATELET
Basophils Absolute: 0.1 10*3/uL (ref 0.0–0.1)
Basophils Relative: 1 % (ref 0–1)
EOS PCT: 4 % (ref 0–5)
Eosinophils Absolute: 0.3 10*3/uL (ref 0.0–0.7)
HEMATOCRIT: 44.7 % (ref 39.0–52.0)
Hemoglobin: 15.5 g/dL (ref 13.0–17.0)
Lymphocytes Relative: 32 % (ref 12–46)
Lymphs Abs: 2.5 10*3/uL (ref 0.7–4.0)
MCH: 30.4 pg (ref 26.0–34.0)
MCHC: 34.7 g/dL (ref 30.0–36.0)
MCV: 87.6 fL (ref 78.0–100.0)
MONOS PCT: 11 % (ref 3–12)
Monocytes Absolute: 0.9 10*3/uL (ref 0.1–1.0)
Neutro Abs: 4.2 10*3/uL (ref 1.7–7.7)
Neutrophils Relative %: 52 % (ref 43–77)
Platelets: 138 10*3/uL — ABNORMAL LOW (ref 150–400)
RBC: 5.1 MIL/uL (ref 4.22–5.81)
RDW: 15.2 % (ref 11.5–15.5)
WBC: 8 10*3/uL (ref 4.0–10.5)

## 2014-10-05 LAB — COMPREHENSIVE METABOLIC PANEL
ALBUMIN: 4.1 g/dL (ref 3.5–5.2)
ALT: 13 U/L (ref 0–53)
AST: 16 U/L (ref 0–37)
Alkaline Phosphatase: 69 U/L (ref 39–117)
Anion gap: 8 (ref 5–15)
BUN: 30 mg/dL — ABNORMAL HIGH (ref 6–23)
CALCIUM: 9.1 mg/dL (ref 8.4–10.5)
CO2: 27 mmol/L (ref 19–32)
Chloride: 102 mmol/L (ref 96–112)
Creatinine, Ser: 2.41 mg/dL — ABNORMAL HIGH (ref 0.50–1.35)
GFR calc Af Amer: 30 mL/min — ABNORMAL LOW (ref >90)
GFR calc non Af Amer: 26 mL/min — ABNORMAL LOW (ref >90)
Glucose, Bld: 147 mg/dL — ABNORMAL HIGH (ref 70–99)
Potassium: 3.8 mmol/L (ref 3.5–5.1)
SODIUM: 137 mmol/L (ref 135–145)
TOTAL PROTEIN: 7.5 g/dL (ref 6.0–8.3)
Total Bilirubin: 0.9 mg/dL (ref 0.3–1.2)

## 2014-10-05 LAB — LACTATE DEHYDROGENASE: LDH: 171 U/L (ref 94–250)

## 2014-10-05 NOTE — Patient Instructions (Signed)
..  [place] at [facility] Discharge Instructions  RECOMMENDATIONS MADE BY THE CONSULTANT AND ANY TEST RESULTS WILL BE SENT TO YOUR REFERRING PHYSICIAN.  We will schedule PET scan and see you back after the scan  Labs today  Thank you for choosing [facility] at [facility] to provide your oncology and hematology care.  To afford each patient quality time with our provider, please arrive at least 15 minutes before your scheduled appointment time.    You need to re-schedule your appointment should you arrive 10 or more minutes late.  We strive to give you quality time with our providers, and arriving late affects you and other patients whose appointments are after yours.  Also, if you no show three or more times for appointments you may be dismissed from the clinic at the providers discretion.     Again, thank you for choosing [facility].  Our hope is that these requests will decrease the amount of time that you wait before being seen by our physicians.       _____________________________________________________________  Should you have questions after your visit to [facility], please contact our office at [phone] between the hours of 8:30 a.m. and 4:30 p.m.  Voicemails left after 4:30 p.m. will not be returned until the following business day.  For prescription refill requests, have your pharmacy contact our office.

## 2014-10-05 NOTE — Progress Notes (Signed)
[place] CONSULT NOTE  Patient Care Team: Ysidro Evert, PA-C as PCP - General (Physician Assistant)  CHIEF COMPLAINTS/PURPOSE OF CONSULTATION:  Marginal Zone Lymphoma EGD 09/03/2014 negative for intestinal metaplasia, dysplasia or malignancy H pylori negative Colonoscopy on 09/03/2014 with 6 polyps (tubular adenomas), redundant left colon  HISTORY OF PRESENTING ILLNESS:  [name] [name] 69 y.o. male is here because of newly diagnosed marginal zone lymphoma.  He was admitted to [facility] in December 2015 with abdominal pain. The pain was described as severe. He also reported episodes of vomiting. At presentation he had elevated liver function tests and bilirubin, CT scan of the abdomen and pelvis showed a possible bladder mass abutting the colon.  He underwent a colonoscopy on 09/03/2014 with Dr. Oneida Alar. No mass was noted on exam. 6 polyps were removed all without dysplasia on final pathology, and a redundant left colon was noted. He ultimately underwent a CT-guided biopsy of the pelvic mass on February 25, with final pathology consistent with a low-grade non-Hodgkin's lymphoma, marginal zone type.  Dates he feels well. Has a good appetite. Does have problems with constipation which may have gotten worse over the last few months but he also states it is more of a chronic problem. He denies any B symptoms    Marginal zone lymphoma   07/23/2014 Imaging CT C/A/P with soft tissue mass in pelvis, LN adjacent to distal esophagus, upper abdomen a partially calcified soft tissue mass   09/27/2014 Initial Biopsy CT guided biopsy     MEDICAL HISTORY:  Past Medical History  Diagnosis Date  . Peripheral vascular disease     neuropathy in toes   . Shortness of breath     due ot pain in knees   . Sleep apnea     sleep study 3/29 13 ? location   . Pneumonia     hx of   . Diabetes mellitus   . Hypothyroidism   . Chronic kidney disease     stage III kidney disease   .  Arthritis     knees, back     SURGICAL HISTORY: Past Surgical History  Procedure Laterality Date  . Cholecystectomy  1992  . Total knee arthroplasty  11/10/2011    Procedure: TOTAL KNEE ARTHROPLASTY;  Surgeon: Mauri Pole, MD;  Location: [facility];  Service: Orthopedics;  Laterality: Right;  . Colonoscopy N/A 09/03/2014    Procedure: COLONOSCOPY;  Surgeon: Danie Binder, MD;  Location: [facility];  Service: Endoscopy;  Laterality: N/A;  12:30 PM  . Esophagogastroduodenoscopy N/A 09/03/2014    Procedure: ESOPHAGOGASTRODUODENOSCOPY (EGD);  Surgeon: Danie Binder, MD;  Location: [facility];  Service: Endoscopy;  Laterality: N/A;    SOCIAL HISTORY: History   Social History  . Marital Status: Divorced    Spouse Name: N/A  . Number of Children: N/A  . Years of Education: N/A   Occupational History  . Not on file.   Social History Main Topics  . Smoking status: Former Smoker -- 1.00 packs/day for 30 years    Quit date: 08/04/2007  . Smokeless tobacco: Never Used  . Alcohol Use: No  . Drug Use: No  . Sexual Activity: Not on file   Other Topics Concern  . Not on file   Social History Narrative   he has worked as a Administrator. He states he has been in 30 states. Currently his work has been mostly Radiation protection practitioner. He is widowed, then remarried and is  now divorced. He has a son and a daughter. He has a smoking history but quit 7 years ago. No significant alcohol consumption. He was born in [place].  FAMILY HISTORY: Family History  Problem Relation Age of Onset  . Colon cancer Neg Hx    has no family status information on file.   His mother is alive at 41 she has a history of breast cancer and a history of lung cancer, both were treated surgically and cured. His father died at 90 from heart problems. He is a history of bladder cancer.  ALLERGIES:  has No Known Allergies.  MEDICATIONS:  Current Outpatient Prescriptions  Medication Sig Dispense Refill  . aspirin 81 MG tablet Take  81 mg by mouth daily.    . ferrous sulfate 325 (65 FE) MG tablet Take 1 tablet (325 mg total) by mouth 3 (three) times daily after meals. (Patient not taking: Reported on 07/23/2014)    . Insulin Glargine (LANTUS SOLOSTAR) 100 UNIT/ML Solostar Pen Inject 51 Units into the skin daily. Pt takes in the AM    . insulin lispro (HUMALOG KWIKPEN) 100 UNIT/ML KiwkPen Inject 3-7 Units into the skin 3 (three) times daily as needed.     Marland Kitchen levothyroxine (SYNTHROID, LEVOTHROID) 300 MCG tablet Take 300 mcg by mouth every morning.    Marland Kitchen losartan (COZAAR) 100 MG tablet Take 100 mg by mouth every morning.    . pantoprazole (PROTONIX) 40 MG tablet Take 1 tablet (40 mg total) by mouth daily before supper. (Patient not taking: Reported on 07/31/2014) 30 tablet 0  . triamcinolone (NASACORT) 55 MCG/ACT AERO nasal inhaler Place 2 sprays into the nose daily as needed (allergies).    . verapamil (CALAN-SR) 180 MG CR tablet Take 180 mg by mouth daily.     No current facility-administered medications for this visit.    Review of Systems  Constitutional: Negative for fever, chills, weight loss and malaise/fatigue.  HENT: Negative for congestion, hearing loss, nosebleeds, sore throat and tinnitus.   Eyes: Negative for blurred vision, double vision, pain and discharge.  Respiratory: Negative for cough, hemoptysis, sputum production, shortness of breath and wheezing.   Cardiovascular: Negative for chest pain, palpitations, claudication, leg swelling and PND.  Gastrointestinal: Positive for constipation. Negative for heartburn, nausea, vomiting, abdominal pain, diarrhea, blood in stool and melena.  Genitourinary: Negative for dysuria, urgency, frequency and hematuria.  Musculoskeletal: Positive for joint pain. Negative for myalgias and falls.       "bad left knee"  Skin: Negative for itching and rash.  Neurological: Negative for dizziness, tingling, tremors, sensory change, speech change, focal weakness, seizures, loss of  consciousness, weakness and headaches.  Endo/Heme/Allergies: Does not bruise/bleed easily.  Psychiatric/Behavioral: Negative for depression, suicidal ideas, memory loss and substance abuse. The patient is not nervous/anxious and does not have insomnia.     PHYSICAL EXAMINATION:  ECOG PERFORMANCE STATUS: 1 - Symptomatic but completely ambulatory  There were no vitals filed for this visit. There were no vitals filed for this visit.   Physical Exam  Constitutional: He is oriented to person, place, and time and well-developed, well-nourished, and in no distress.  Obese  HENT:  Head: Normocephalic and atraumatic.  Nose: Nose normal.  Mouth/Throat: Oropharynx is clear and moist. No oropharyngeal exudate.  Eyes: Conjunctivae and EOM are normal. Pupils are equal, round, and reactive to light. Right eye exhibits no discharge. Left eye exhibits no discharge. No scleral icterus.  Neck: Normal range of motion. Neck supple. No tracheal deviation  present. No thyromegaly present.  Cardiovascular: Normal rate, regular rhythm and normal heart sounds.  Exam reveals no gallop and no friction rub.   No murmur heard. Pulmonary/Chest: Effort normal and breath sounds normal. He has no wheezes. He has no rales.  Abdominal: Soft. Bowel sounds are normal. He exhibits no distension and no mass. There is no tenderness. There is no rebound and no guarding.  Musculoskeletal: Normal range of motion. He exhibits no edema.  Lymphadenopathy:    He has no cervical adenopathy.  Neurological: He is alert and oriented to person, place, and time. He has normal reflexes. No cranial nerve deficit. Gait normal. Coordination normal.  Skin: Skin is warm and dry. No rash noted.  Psychiatric: Mood, memory, affect and judgment normal.  Nursing note and vitals reviewed.    LABORATORY DATA:  I have reviewed the data as listed Lab Results  Component Value Date   WBC 5.9 09/27/2014   HGB 15.3 09/27/2014   HCT 43.5 09/27/2014    MCV 86.0 09/27/2014   PLT 121* 09/27/2014     Chemistry      Component Value Date/Time   NA 136 07/25/2014 0542   K 3.0* 07/25/2014 0542   CL 101 07/25/2014 0542   CO2 25 07/25/2014 0542   BUN 44* 07/25/2014 0542   CREATININE 2.88* 07/25/2014 0542      Component Value Date/Time   CALCIUM 8.2* 07/25/2014 0542   ALKPHOS 174* 07/25/2014 0542   AST 34 07/25/2014 0542   ALT 44 07/25/2014 0542   BILITOT 3.0* 07/25/2014 0542     Soft tissue mass, biopsy, adjacent to urinary bladder - ATYPICAL LYMPHOID PROLIFERATION CONSISTENT WITH NON-HODGKIN'S B-CELL LYMPHOMA. - SEE ONCOLOGY TABLE. Histologic type: Non-Hodgkin's lymphoma, see comment. Grade (if applicable): Low grade. Flow cytometry: No tissue is available for analysis since the specimen was received in formalin. Immunohistochemical stains: BCL-6, CD3, CD5, CD10, CD20, CD21, CD34, CD43, CD79a, CD138, Cyclin D-1, kappa, lambda, TdT, and Ki-67 with appropriate controls. Touch preps/imprints: Not performed. Comments: The sections show needle core biopsy of soft tissue displaying a very dense and relatively monomorphic infiltrate of small lymphoid cells with high nuclear cytoplasmic ratio, round to irregular nuclei, dense chromatin, and small to inconspicuous nucleoli. The appearance is diffuse with lack of obvious follicular structures or proliferation centers. No necrosis or conspicuous mitosis is identified. To further evaluate this process, immunohistochemical stains were performed and show that the overwhelming majority of lymphocytes consist of B-cells, as highlighted with CD20 and CD79a. B-lymphocytes show no significant staining with BCL-6, CD10, CD5, Cyclin D-1, CD34, or TdT. CD21 highlights scattered small foci of dendritic networks throughout the core biopsies. CD138 highlights a minor plasma cell component, which consist of scattered cells and variably sized but predominantly small clusters. Kappa and lambda stains failed  to show any significant staining and are considered non-contributory. Ki-67 shows very low expression (less than 5%). There is an admixed minor T-cell component present as seen with CD3, CD5, and CD43. The overall features are consistent with low grade non-Hodgkin's B-cell lymphoma and the overall phenotypic features favor marginal zone type. Clinical correlation is strongly recommended. (BNS:ds 10/01/14)   RADIOGRAPHIC STUDIES: I have personally reviewed the radiological images as listed and agreed with the findings in the report.   CLINICAL DATA: Upper abdominal pain, possible periesophageal lymph node at incomplete imaging of the lung bases on CT abdomen pelvis performed earlier today.  EXAM: CT CHEST WITHOUT CONTRAST/CT ABDOMEN     IMPRESSION: Mild subcarinal lymphadenopathy.  This is amenable to further evaluation at presumed global staging at PET-CT performed on a nonemergent outpatient basis, pending on results of presumed pending cystoscopy for a previously reported bladder mass. This could be reactive although metastatic lymphadenopathy could appear similar.  Patchy areas of pulmonary parenchymal nodularity with a vague tree-in-bud type appearance most typical for small airways infection. This is also amenable to followup at presumed future restaging studies.   Electronically Signed  By: Conchita Paris M.D.  On: 07/23/2014 19:52  IMPRESSION: Soft tissue mass intimately opposed to the superior and left wall of the urinary bladder. It is uncertain whether this arises from the bladder but felt to be most likely. Periaortic and iliac chain lymph nodes are noted. Further evaluation is recommended.  The abnormality seen on recent plain film in the chest is not well visualized on this exam.  Likely lymph node adjacent to the distal esophagus. This is incompletely evaluated as it is only seen on the first image. CT of the chest may be helpful for further  evaluation.  Although not mentioned in the body of the report, In the upper abdominal cavity just below the xiphoid process, there is a partially calcified soft tissue lesion. This is of uncertain significance. The possibility of a peritoneal lesion cannot be totally excluded.   Electronically Signed  By: Inez Catalina M.D.  On: 07/23/2014 17:36   ASSESSMENT & PLAN:   Marginal Zone Lymphoma presenting as a pelvic soft tissue mass  Pleasant 69 year old male who presented to the inpatient service at [facility] with acute abdominal pain, nausea and vomiting, elevated LFTs. Ultrasound showed hepatosplenomegaly. CT noted a soft tissue mass opposed to the superior and left wall of the urinary bladder. At the time of his evaluation it was uncertain if this mass arose from the bowel or bladder. CT also reported a partially calcified soft tissue lesion just below the xiphoid process and a mildly enlarged lymph node adjacent to the distal esophagus. He was seen by GI. A colonoscopy performed on February 1 showed a redundant colon and multiple polyps but no mass. EGD performed the same day showed mild gastritis, duodenitis, and esophagitis. Biopsy of the mass revealed a low-grade non-Hodgkin's lymphoma consistent with a marginal zone type. His presentation is certainly not typical.  He is currently asymptomatic from the lesion, primary concern being the location of the lesion and potential for problems down the road. Rituxan has been studied as a single agent in marginal zone lymphomas with one study showing a response rate of 100% and a CR 69%. The optimal management of his disease is not clearly defined. We also talked about limited field radiation as a possibility either now or down the road. I am not sure of the other findings on his CT imaging are lymphomatous or not. I recommended PET imaging, keeping in mind that it is a low-grade lymphoma however it may be beneficial.  He was provided with  reading information. We will set him up for PET imaging, I will bring him back for additional evaluation and discussion once he has completed PET scanning. We will obtain baseline laboratory studies today including a CBC, CMP and LDH.  All questions were answered. The patient knows to call the clinic with any problems, questions or concerns.  This note was electronically signed.    Molli Hazard, MD MD 10/05/2014 1:08 PM

## 2014-10-17 ENCOUNTER — Ambulatory Visit (HOSPITAL_COMMUNITY)
Admission: RE | Admit: 2014-10-17 | Discharge: 2014-10-17 | Disposition: A | Discharge status: Home / Self Care | Payer: Medicare HMO | Source: Ambulatory Visit | Attending: Hematology & Oncology | Admitting: Hematology & Oncology

## 2014-10-17 DIAGNOSIS — C8580 Other specified types of non-Hodgkin lymphoma, unspecified site: Secondary | ICD-10-CM | POA: Diagnosis present

## 2014-10-17 LAB — GLUCOSE, CAPILLARY: Glucose-Capillary: 221 mg/dL — ABNORMAL HIGH (ref 70–99)

## 2014-10-17 MED ORDER — FLUDEOXYGLUCOSE F - 18 (FDG) INJECTION
15.9000 | Freq: Once | INTRAVENOUS | Status: AC | PRN
  Administered 2014-10-17: 15.9 via INTRAVENOUS

## 2014-10-19 ENCOUNTER — Encounter (HOSPITAL_BASED_OUTPATIENT_CLINIC_OR_DEPARTMENT_OTHER): Payer: Medicare HMO | Admitting: Hematology & Oncology

## 2014-10-19 ENCOUNTER — Encounter (HOSPITAL_COMMUNITY): Payer: Self-pay | Admitting: Hematology & Oncology

## 2014-10-19 VITALS — BP 141/85 | HR 76 | Temp 98.3°F | Resp 18 | Wt 325.6 lb

## 2014-10-19 DIAGNOSIS — C8580 Other specified types of non-Hodgkin lymphoma, unspecified site: Secondary | ICD-10-CM | POA: Diagnosis not present

## 2014-10-19 NOTE — Patient Instructions (Signed)
[place] at [facility] Discharge Instructions  RECOMMENDATIONS MADE BY THE CONSULTANT AND ANY TEST RESULTS WILL BE SENT TO YOUR REFERRING PHYSICIAN. Discussion by Dr. Whitney Muse.   Results of your PET Scan is attached. Will get you scheduled for chemotherapy teaching with Lupita Raider our Nurse Navigator. Plans are to treat you with Rituxan to see if we can get shrinkage at the bladder area. (information about Rituxan also attached.) EXAM: NUCLEAR MEDICINE PET SKULL BASE TO THIGH  TECHNIQUE: 16.0 mCi F-18 FDG was injected intravenously. Full-ring PET imaging was performed from the skull base to thigh after the radiotracer. CT data was obtained and used for attenuation correction and anatomic localization.  FASTING BLOOD GLUCOSE: Value: 221 mg/dl  COMPARISON: CT abdomen pelvis 07/23/2014  FINDINGS: NECK  No hypermetabolic lymph nodes in the neck.  CHEST  No hypermetabolic mediastinal or hilar nodes. No suspicious pulmonary nodules on the CT scan.  ABDOMEN/PELVIS  There is a hypermetabolic mass inseparable from the left aspect of the bladder. The mass measures 8.3 x 5.9 cm and is relatively mild in metabolic activity (SUV max 4.0). There are mildly metabolic left external iliac lymph nodes. The highest metabolic pelvic lymph node is a left common iliac lymph node measuring 16 mm on image 59, series 4 with SUV max equal 3.3.  There is a hypermetabolic retroperitoneal lymph nodes anterior to the left renal vein measuring 16 mm short axis on image 129, series 4 with SUV max equal all 4.8.  The spleen is normal volume and normal metabolic activity. Pneumobilia noted. No focal hepatic lesion.  SKELETON  No focal hypermetabolic activity to suggest skeletal metastasis.  IMPRESSION: 1. Hypermetabolic mass left adjacent to the bladder. The mass is mild to moderate in metabolic activity consistent with low-grade lymphoma. 2. Mild left  external iliac and common iliac metabolic adenopathy. 3. Single the probably retroperitoneal node anterior to the left renal vein is moderately hypermetabolic.   Electronically Signed  By: Suzy Bouchard M.D.  On: 10/17/2014 11:24    Thank you for choosing [place] at [facility] to provide your oncology and hematology care.  To afford each patient quality time with our provider, please arrive at least 15 minutes before your scheduled appointment time.    You need to re-schedule your appointment should you arrive 10 or more minutes late.  We strive to give you quality time with our providers, and arriving late affects you and other patients whose appointments are after yours.  Also, if you no show three or more times for appointments you may be dismissed from the clinic at the providers discretion.     Again, thank you for choosing [facility].  Our hope is that these requests will decrease the amount of time that you wait before being seen by our physicians.       _____________________________________________________________  Should you have questions after your visit to [facility], please contact our office at [phone] between the hours of 8:30 a.m. and 4:30 p.m.  Voicemails left after 4:30 p.m. will not be returned until the following business day.  For prescription refill requests, have your pharmacy contact our office.    Rituximab injection What is this medicine? RITUXIMAB (ri TUX i mab) is a monoclonal antibody. This medicine changes the way the body's immune system works. It is used commonly to treat non-Hodgkin's lymphoma and other conditions. In cancer cells, this drug targets a specific protein within  cancer cells and stops the cancer cells from growing. It is also used to treat rhuematoid arthritis (RA). In RA, this medicine slow the inflammatory process and help reduce joint pain and swelling. This medicine is often  used with other cancer or arthritis medications. This medicine may be used for other purposes; ask your health care provider or pharmacist if you have questions. COMMON BRAND NAME(S): Rituxan What should I tell my health care provider before I take this medicine? They need to know if you have any of these conditions: -blood disorders -heart disease -history of hepatitis B -infection (especially a virus infection such as chickenpox, cold sores, or herpes) -irregular heartbeat -kidney disease -lung or breathing disease, like asthma -lupus -an unusual or allergic reaction to rituximab, mouse proteins, other medicines, foods, dyes, or preservatives -pregnant or trying to get pregnant -breast-feeding How should I use this medicine? This medicine is for infusion into a vein. It is administered in a hospital or clinic by a specially trained health care professional. A special MedGuide will be given to you by the pharmacist with each prescription and refill. Be sure to read this information carefully each time. Talk to your pediatrician regarding the use of this medicine in children. This medicine is not approved for use in children. Overdosage: If you think you have taken too much of this medicine contact a poison control center or emergency room at once. NOTE: This medicine is only for you. Do not share this medicine with others. What if I miss a dose? It is important not to miss a dose. Call your doctor or health care professional if you are unable to keep an appointment. What may interact with this medicine? -cisplatin -medicines for blood pressure -some other medicines for arthritis -vaccines This list may not describe all possible interactions. Give your health care provider a list of all the medicines, herbs, non-prescription drugs, or dietary supplements you use. Also tell them if you smoke, drink alcohol, or use illegal drugs. Some items may interact with your medicine. What should I  watch for while using this medicine? Report any side effects that you notice during your treatment right away, such as changes in your breathing, fever, chills, dizziness or lightheadedness. These effects are more common with the first dose. Visit your prescriber or health care professional for checks on your progress. You will need to have regular blood work. Report any other side effects. The side effects of this medicine can continue after you finish your treatment. Continue your course of treatment even though you feel ill unless your doctor tells you to stop. Call your doctor or health care professional for advice if you get a fever, chills or sore throat, or other symptoms of a cold or flu. Do not treat yourself. This drug decreases your body's ability to fight infections. Try to avoid being around people who are sick. This medicine may increase your risk to bruise or bleed. Call your doctor or health care professional if you notice any unusual bleeding. Be careful brushing and flossing your teeth or using a toothpick because you may get an infection or bleed more easily. If you have any dental work done, tell your dentist you are receiving this medicine. Avoid taking products that contain aspirin, acetaminophen, ibuprofen, naproxen, or ketoprofen unless instructed by your doctor. These medicines may hide a fever. Do not become pregnant while taking this medicine. Women should inform their doctor if they wish to become pregnant or think they might be pregnant. There  is a potential for serious side effects to an unborn child. Talk to your health care professional or pharmacist for more information. Do not breast-feed an infant while taking this medicine. What side effects may I notice from receiving this medicine? Side effects that you should report to your doctor or health care professional as soon as possible: -allergic reactions like skin rash, itching or hives, swelling of the face, lips, or  tongue -low blood counts - this medicine may decrease the number of white blood cells, red blood cells and platelets. You may be at increased risk for infections and bleeding. -signs of infection - fever or chills, cough, sore throat, pain or difficulty passing urine -signs of decreased platelets or bleeding - bruising, pinpoint red spots on the skin, black, tarry stools, blood in the urine -signs of decreased red blood cells - unusually weak or tired, fainting spells, lightheadedness -breathing problems -confused, not responsive -chest pain -fast, irregular heartbeat -feeling faint or lightheaded, falls -mouth sores -redness, blistering, peeling or loosening of the skin, including inside the mouth -stomach pain -swelling of the ankles, feet, or hands -trouble passing urine or change in the amount of urine Side effects that usually do not require medical attention (report to your doctor or other health care professional if they continue or are bothersome): -anxiety -headache -loss of appetite -muscle aches -nausea -night sweats This list may not describe all possible side effects. Call your doctor for medical advice about side effects. You may report side effects to FDA at 1-800-FDA-1088. Where should I keep my medicine? This drug is given in a hospital or clinic and will not be stored at home. NOTE: This sheet is a summary. It may not cover all possible information. If you have questions about this medicine, talk to your doctor, pharmacist, or health care provider.  2015, [employer]vier/Gold Standard. (2008-03-19 14:04:59)

## 2014-10-19 NOTE — Progress Notes (Signed)
[place] Progress Note  Patient Care Team: Ysidro Evert, PA-C as PCP - General (Physician Assistant)  CHIEF COMPLAINTS/PURPOSE OF CONSULTATION:  Marginal Zone Lymphoma EGD 09/03/2014 negative for intestinal metaplasia, dysplasia or malignancy H pylori negative Colonoscopy on 09/03/2014 with 6 polyps (tubular adenomas), redundant left colon  HISTORY OF PRESENTING ILLNESS:  [name] [name] 69 y.o. male is here because of newly diagnosed marginal zone lymphoma.  He was admitted to [facility] in December 2015 with abdominal pain. The pain was described as severe. He also reported episodes of vomiting. At presentation he had elevated liver function tests and bilirubin, CT scan of the abdomen and pelvis showed a possible bladder mass abutting the colon.  He underwent a colonoscopy on 09/03/2014 with Dr. Oneida Alar. No mass was noted on exam. 6 polyps were removed all without dysplasia on final pathology, and a redundant left colon was noted. He ultimately underwent a CT-guided biopsy of the pelvic mass on February 25, with final pathology consistent with a low-grade non-Hodgkin's lymphoma, marginal zone type.  He continues to feel well. He denies any B symptoms. He denies any new pain. He is here to review the results of his recent PET scan.    Marginal zone lymphoma   07/23/2014 Imaging CT C/A/P with soft tissue mass in pelvis, LN adjacent to distal esophagus, upper abdomen a partially calcified soft tissue mass   09/27/2014 Initial Biopsy CT guided biopsy   10/17/2014 PET scan Hypermetabolic mass left adjacent to the bladder. The mass is mild to moderate in metabolic activity consistent with low-grade lymphoma. Mild left external iliac and common iliac metabolic adenopathy.Single retroperitoneal node anterior to L renal vein   10/31/2014 -  Chemotherapy Single Agent Rituxan     MEDICAL HISTORY:  Past Medical History  Diagnosis Date  . Peripheral vascular disease     neuropathy  in toes   . Shortness of breath     due ot pain in knees   . Sleep apnea     sleep study 3/29 13 ? location   . Pneumonia     hx of   . Diabetes mellitus   . Hypothyroidism   . Chronic kidney disease     stage III kidney disease   . Arthritis     knees, back   . S/P biopsy     SURGICAL HISTORY: Past Surgical History  Procedure Laterality Date  . Cholecystectomy  1992  . Total knee arthroplasty  11/10/2011    Procedure: TOTAL KNEE ARTHROPLASTY;  Surgeon: Mauri Pole, MD;  Location: [facility];  Service: Orthopedics;  Laterality: Right;  . Colonoscopy N/A 09/03/2014    Procedure: COLONOSCOPY;  Surgeon: Danie Binder, MD;  Location: [facility];  Service: Endoscopy;  Laterality: N/A;  12:30 PM  . Esophagogastroduodenoscopy N/A 09/03/2014    Procedure: ESOPHAGOGASTRODUODENOSCOPY (EGD);  Surgeon: Danie Binder, MD;  Location: [facility];  Service: Endoscopy;  Laterality: N/A;    SOCIAL HISTORY: History   Social History  . Marital Status: Divorced    Spouse Name: N/A  . Number of Children: N/A  . Years of Education: N/A   Occupational History  . Not on file.   Social History Main Topics  . Smoking status: Former Smoker -- 1.00 packs/day for 30 years    Quit date: 08/04/2007  . Smokeless tobacco: Never Used  . Alcohol Use: No  . Drug Use: No  . Sexual Activity: Not on file   Other  Topics Concern  . Not on file   Social History Narrative    FAMILY HISTORY: Family History  Problem Relation Age of Onset  . Colon cancer Neg Hx    has no family status information on file.   ALLERGIES:  has No Known Allergies.  MEDICATIONS:  Current Outpatient Prescriptions  Medication Sig Dispense Refill  . aspirin 81 MG tablet Take 81 mg by mouth daily.    . Insulin Glargine (LANTUS SOLOSTAR) 100 UNIT/ML Solostar Pen Inject 51 Units into the skin daily. Pt takes in the AM    . insulin lispro (HUMALOG KWIKPEN) 100 UNIT/ML KiwkPen Inject 3-7 Units into the skin 3 (three) times  daily as needed.     Marland Kitchen levothyroxine (SYNTHROID, LEVOTHROID) 300 MCG tablet Take 300 mcg by mouth every morning.    Marland Kitchen losartan (COZAAR) 100 MG tablet Take 100 mg by mouth every morning.    . triamcinolone (NASACORT) 55 MCG/ACT AERO nasal inhaler Place 2 sprays into the nose daily as needed (allergies).    Marland Kitchen UNABLE TO FIND Apply 1 application topically as needed. Allergy cream    . UNABLE TO FIND Apply 1 application topically as needed. Diabetic skin relief foot cream    . verapamil (CALAN-SR) 180 MG CR tablet Take 180 mg by mouth daily.    . ferrous sulfate 325 (65 FE) MG tablet Take 1 tablet (325 mg total) by mouth 3 (three) times daily after meals. (Patient not taking: Reported on 07/23/2014)    . GuaiFENesin (MUCINEX PO) Take by mouth. A liquid, did not know correct dose    . ondansetron (ZOFRAN) 8 MG tablet Take 1 tablet (8 mg total) by mouth every 8 (eight) hours as needed for nausea or vomiting. (Patient not taking: Reported on 11/07/2014) 30 tablet 2   No current facility-administered medications for this visit.    Review of Systems  Constitutional: Negative for fever, chills, weight loss and malaise/fatigue.  HENT: Negative for congestion, hearing loss, nosebleeds, sore throat and tinnitus.   Eyes: Negative for blurred vision, double vision, pain and discharge.  Respiratory: Negative for cough, hemoptysis, sputum production, shortness of breath and wheezing.   Cardiovascular: Negative for chest pain, palpitations, claudication, leg swelling and PND.  Gastrointestinal: Negative for heartburn, nausea, vomiting, abdominal pain, diarrhea, constipation, blood in stool and melena.  Genitourinary: Negative for dysuria, urgency, frequency and hematuria.  Musculoskeletal: Negative for myalgias, joint pain and falls.  Skin: Negative for itching and rash.  Neurological: Negative for dizziness, tingling, tremors, sensory change, speech change, focal weakness, seizures, loss of consciousness,  weakness and headaches.  Endo/Heme/Allergies: Does not bruise/bleed easily.  Psychiatric/Behavioral: Negative for depression, suicidal ideas, memory loss and substance abuse. The patient is not nervous/anxious and does not have insomnia.     PHYSICAL EXAMINATION: ECOG PERFORMANCE STATUS: 1 - Symptomatic but completely ambulatory  Filed Vitals:   10/19/14 1144  BP: 141/85  Pulse: 76  Temp: 98.3 F (36.8 C)  Resp: 18   Filed Weights   10/19/14 1144  Weight: 325 lb 9.6 oz (147.691 kg)     Physical Exam  Constitutional: He is oriented to person, place, and time and well-developed, well-nourished, and in no distress.  Obese, wears dark sunglasses  HENT:  Head: Normocephalic and atraumatic.  Nose: Nose normal.  Mouth/Throat: Oropharynx is clear and moist. No oropharyngeal exudate.  Eyes: Conjunctivae and EOM are normal. Pupils are equal, round, and reactive to light. Right eye exhibits no discharge. Left eye exhibits no discharge.  No scleral icterus.  Neck: Normal range of motion. Neck supple. No tracheal deviation present. No thyromegaly present.  Cardiovascular: Normal rate, regular rhythm and normal heart sounds.  Exam reveals no gallop and no friction rub.   No murmur heard. Pulmonary/Chest: Effort normal and breath sounds normal. He has no wheezes. He has no rales.  Abdominal: Soft. Bowel sounds are normal. He exhibits no distension and no mass. There is no tenderness. There is no rebound and no guarding.  Musculoskeletal: Normal range of motion. He exhibits no edema.  Chronic LE edema with brawny skin changes  Lymphadenopathy:    He has no cervical adenopathy.  Neurological: He is alert and oriented to person, place, and time. He has normal reflexes. No cranial nerve deficit. Gait normal. Coordination normal.  Skin: Skin is warm and dry. No rash noted.  Chronic LE skin changes  Psychiatric: Mood, memory, affect and judgment normal.  Nursing note and vitals  reviewed.    LABORATORY DATA:  I have reviewed the data as listed:  NONE TODAY   Soft tissue mass, biopsy, adjacent to urinary bladder - ATYPICAL LYMPHOID PROLIFERATION CONSISTENT WITH NON-HODGKIN'S B-CELL LYMPHOMA. - SEE ONCOLOGY TABLE. Histologic type: Non-Hodgkin's lymphoma, see comment. Grade (if applicable): Low grade. Flow cytometry: No tissue is available for analysis since the specimen was received in formalin. Immunohistochemical stains: BCL-6, CD3, CD5, CD10, CD20, CD21, CD34, CD43, CD79a, CD138, Cyclin D-1, kappa, lambda, TdT, and Ki-67 with appropriate controls. Touch preps/imprints: Not performed. Comments: The sections show needle core biopsy of soft tissue displaying a very dense and relatively monomorphic infiltrate of small lymphoid cells with high nuclear cytoplasmic ratio, round to irregular nuclei, dense chromatin, and small to inconspicuous nucleoli. The appearance is diffuse with lack of obvious follicular structures or proliferation centers. No necrosis or conspicuous mitosis is identified. To further evaluate this process, immunohistochemical stains were performed and show that the overwhelming majority of lymphocytes consist of B-cells, as highlighted with CD20 and CD79a. B-lymphocytes show no significant staining with BCL-6, CD10, CD5, Cyclin D-1, CD34, or TdT. CD21 highlights scattered small foci of dendritic networks throughout the core biopsies. CD138 highlights a minor plasma cell component, which consist of scattered cells and variably sized but predominantly small clusters. Kappa and lambda stains failed to show any significant staining and are considered non-contributory. Ki-67 shows very low expression (less than 5%). There is an admixed minor T-cell component present as seen with CD3, CD5, and CD43. The overall features are consistent with low grade non-Hodgkin's B-cell lymphoma and the overall phenotypic features favor marginal zone type. Clinical  correlation is strongly recommended. (BNS:ds 10/01/14)   RADIOGRAPHIC STUDIES: I have personally reviewed the radiological images as listed and agreed with the findings in the report. CLINICAL DATA: Initial treatment strategy for non-Hodgkin's lymphoma.  EXAM: NUCLEAR MEDICINE PET SKULL BASE TO THIGH IMPRESSION: 1. Hypermetabolic mass left adjacent to the bladder. The mass is mild to moderate in metabolic activity consistent with low-grade lymphoma. 2. Mild left external iliac and common iliac metabolic adenopathy. 3. Single the probably retroperitoneal node anterior to the left renal vein is moderately hypermetabolic.   Electronically Signed  By: Suzy Bouchard M.D.  On: 10/17/2014 11:24  ASSESSMENT & PLAN:   Marginal zone lymphoma Mass sits between the bladder and rectum.  We have discussed his disease in detail. We have talked about observation as a potential possibility. We finally decided that based on the location of the primary tumor we will proceed with single agent Rituxan. He  has spent time reading and researching and feels comfortable with this plan.  We will arrange for chemotherapy teaching. We will start his therapy in the next several weeks at his convenience. I will see him back 1 week posttherapy to review the results. We will ensure all pretreatment labs are obtained including a hepatitis panel. I again reminded him that Rituxan therapy is a "slow working" treatment. He has a good understanding of this and wishes to proceed.  I have reviewed his PET scan with him in detail.  All questions were answered. The patient knows to call the clinic with any problems, questions or concerns.  This note was electronically signed.    Molli Hazard, MD  11/11/2014 10:55 AM

## 2014-10-22 MED ORDER — ONDANSETRON HCL 8 MG PO TABS: 8.0000 mg | ORAL_TABLET | Freq: Three times a day (TID) | ORAL | Status: DC | PRN

## 2014-10-22 NOTE — Patient Instructions (Addendum)
[facility]   CHEMOTHERAPY INSTRUCTIONS   Rituxan - Before taking Rituxan you need to take Tylenol 650mg  and Benadryl 50mg  1 hour before the Rituxan. You can take this at home. This reduces your risk of having an allergic reaction to the Rituxan. You will do this each time prior to Rituxan. Side Effects: during infusion - itching, low blood pressure, low oxygen, bronchospasm, rash, trouble breathing - we need to know immediately if any of this happens. The first time you receive this drug, it takes a long time to infuse because we titrate the drug very slowly. With each Rituxan infusion, the likelihood of developing an infusion reaction decreases. You may also experience fever, chills, shaking chills, headaches, muscle aches, nausea, rash, and a low white blood cell count. We need to be sure that you are drinking plenty of fluids - preferably 64oz of decaff fluids/water daily. It is best to start drinking fluids 2 days prior to treatment and for up to 4-5 days after treatment. As your tumor breaks down, it leaves behind uric acid and the extra fluid that you drink helps to flush this out of your body.   POTENTIAL SIDE EFFECTS OF TREATMENT: Increased Susceptibility to Infection, Vomiting, Constipation, Hair Thinning, Changes in Character of Skin and Nails (brittleness, dryness,etc.), Bone Marrow Suppression, Complete Hair Loss, Nausea and Diarrhea   EDUCATIONAL MATERIALS GIVEN AND REVIEWED: Chemotherapy and You booklet Specific Instructions Sheets: Rituxan, Benadryl,Tylenol, Zofran   SELF CARE ACTIVITIES WHILE ON CHEMOTHERAPY: Increase your fluid intake 48 hours prior to treatment and drink at least 2 quarts per day after treatment., No alcohol intake., No aspirin or other medications unless approved by your oncologist., Eat foods that are light and easy to digest., Eat foods at cold or room temperature., No fried, fatty, or spicy foods immediately before or after  treatment., Have teeth cleaned professionally before starting treatment. Keep dentures and partial plates clean., Use soft toothbrush and do not use mouthwashes that contain alcohol. Biotene is a good mouthwash that is available at most pharmacies or may be ordered by calling [phone]., Use warm salt water gargles (1 teaspoon salt per 1 quart warm water) before and after meals and at bedtime. Or you may rinse with 2 tablespoons of three -percent hydrogen peroxide mixed in eight ounces of water. and Always use sunscreen with SPF (Sun Protection Factor) of 30 or higher.  Please wash your hands for at least 30 seconds using warm soapy water. Handwashing is the #1 way to prevent the spread of germs. Stay away from sick people or people who are getting over a cold. If you develop respiratory systems such as green/yellow mucus production or productive cough or persistent cough let us know and we will see if you need an antibiotic. It is a good idea to keep a pair of gloves on when going into grocery stores/[employer] to decrease your risk of coming into contact with germs on the carts, etc. Carry alcohol hand gel with you at all times and use it frequently if out in public. All foods need to be cooked thoroughly. No raw foods. No medium or undercooked meats, eggs. If your food is cooked medium well, it does not need to be hot pink or saturated with bloody liquid at all. Vegetables and fruits need to be washed/rinsed under the faucet with a dish detergent before being consumed. You can eat raw fruits and vegetables unless we tell you otherwise but it would be best  if you cooked them or bought frozen. Do not eat off of salad bars or hot bars unless you really trust the cleanliness of the restaurant. If you need dental work, please let Dr. Whitney Muse know before you go for your appointment so that we can coordinate the best possible time for you in regards to your chemo regimen. You need to also let your dentist know that  you are actively taking chemo. We may need to do labs prior to your dental appointment. We also want your bowels moving at least every other day. If this is not happening, we need to know so that we can get you on a bowel regimen to help you go.    MEDICATIONS: You have been given prescriptions for the following medications:  Zofran 8mg  tablet. Take 1 tablet every 8 hours as needed for nausea/vomiting.   Over-the-Counter Meds:  Senna - this is a mild laxative used to treat mild constipation. May take 2 tabs by mouth daily or up to twice a day as needed for mild constipation.  Milk of Magnesia - this is a laxative used to treat moderate to severe constipation. May take 2-4 tablespoons every 8 hours as needed. May increase to 8 tablespoons x 1 dose and if no bowel movement call the [facility].  Imodium - this is for diarrhea. Take 2 tabs after 1st loose stool and then 1 tab every 2 hours until you go a total of 12 hours without a loose stool. Call [facility] if loose stools continue.     SYMPTOMS TO REPORT AS SOON AS POSSIBLE AFTER TREATMENT:  FEVER GREATER THAN 100.5 F  CHILLS WITH OR WITHOUT FEVER  NAUSEA AND VOMITING THAT IS NOT CONTROLLED WITH YOUR NAUSEA MEDICATION  UNUSUAL SHORTNESS OF BREATH  UNUSUAL BRUISING OR BLEEDING  TENDERNESS IN MOUTH AND THROAT WITH OR WITHOUT PRESENCE OF ULCERS  URINARY PROBLEMS  BOWEL PROBLEMS  UNUSUAL RASH    Wear comfortable clothing and clothing appropriate for easy access to any Portacath or PICC line. Let us know if there is anything that we can do to make your therapy better!      I have been informed and understand all of the instructions given to me and have received a copy. I have been instructed to call the clinic [phone] or my family physician as soon as possible for continued medical care, if indicated. I do not have any more questions at this time but understand that I may call the [facility] or the Patient  Navigator at [phone] during office hours should I have questions or need assistance in obtaining follow-up care.            Rituximab injection What is this medicine? RITUXIMAB (ri TUX i mab) is a monoclonal antibody. This medicine changes the way the body's immune system works. It is used commonly to treat non-Hodgkin's lymphoma and other conditions. In cancer cells, this drug targets a specific protein within cancer cells and stops the cancer cells from growing. It is also used to treat rhuematoid arthritis (RA). In RA, this medicine slow the inflammatory process and help reduce joint pain and swelling. This medicine is often used with other cancer or arthritis medications. This medicine may be used for other purposes; ask your health care provider or pharmacist if you have questions. COMMON BRAND NAME(S): Rituxan What should I tell my health care provider before I take this medicine? They need to know if you have any of these  conditions: -blood disorders -heart disease -history of hepatitis B -infection (especially a virus infection such as chickenpox, cold sores, or herpes) -irregular heartbeat -kidney disease -lung or breathing disease, like asthma -lupus -an unusual or allergic reaction to rituximab, mouse proteins, other medicines, foods, dyes, or preservatives -pregnant or trying to get pregnant -breast-feeding How should I use this medicine? This medicine is for infusion into a vein. It is administered in a hospital or clinic by a specially trained health care professional. A special MedGuide will be given to you by the pharmacist with each prescription and refill. Be sure to read this information carefully each time. Talk to your pediatrician regarding the use of this medicine in children. This medicine is not approved for use in children. Overdosage: If you think you have taken too much of this medicine contact a poison control center or emergency room at  once. NOTE: This medicine is only for you. Do not share this medicine with others. What if I miss a dose? It is important not to miss a dose. Call your doctor or health care professional if you are unable to keep an appointment. What may interact with this medicine? -cisplatin -medicines for blood pressure -some other medicines for arthritis -vaccines This list may not describe all possible interactions. Give your health care provider a list of all the medicines, herbs, non-prescription drugs, or dietary supplements you use. Also tell them if you smoke, drink alcohol, or use illegal drugs. Some items may interact with your medicine. What should I watch for while using this medicine? Report any side effects that you notice during your treatment right away, such as changes in your breathing, fever, chills, dizziness or lightheadedness. These effects are more common with the first dose. Visit your prescriber or health care professional for checks on your progress. You will need to have regular blood work. Report any other side effects. The side effects of this medicine can continue after you finish your treatment. Continue your course of treatment even though you feel ill unless your doctor tells you to stop. Call your doctor or health care professional for advice if you get a fever, chills or sore throat, or other symptoms of a cold or flu. Do not treat yourself. This drug decreases your body's ability to fight infections. Try to avoid being around people who are sick. This medicine may increase your risk to bruise or bleed. Call your doctor or health care professional if you notice any unusual bleeding. Be careful brushing and flossing your teeth or using a toothpick because you may get an infection or bleed more easily. If you have any dental work done, tell your dentist you are receiving this medicine. Avoid taking products that contain aspirin, acetaminophen, ibuprofen, naproxen, or ketoprofen  unless instructed by your doctor. These medicines may hide a fever. Do not become pregnant while taking this medicine. Women should inform their doctor if they wish to become pregnant or think they might be pregnant. There is a potential for serious side effects to an unborn child. Talk to your health care professional or pharmacist for more information. Do not breast-feed an infant while taking this medicine. What side effects may I notice from receiving this medicine? Side effects that you should report to your doctor or health care professional as soon as possible: -allergic reactions like skin rash, itching or hives, swelling of the face, lips, or tongue -low blood counts - this medicine may decrease the number of white blood cells, red blood cells  and platelets. You may be at increased risk for infections and bleeding. -signs of infection - fever or chills, cough, sore throat, pain or difficulty passing urine -signs of decreased platelets or bleeding - bruising, pinpoint red spots on the skin, black, tarry stools, blood in the urine -signs of decreased red blood cells - unusually weak or tired, fainting spells, lightheadedness -breathing problems -confused, not responsive -chest pain -fast, irregular heartbeat -feeling faint or lightheaded, falls -mouth sores -redness, blistering, peeling or loosening of the skin, including inside the mouth -stomach pain -swelling of the ankles, feet, or hands -trouble passing urine or change in the amount of urine Side effects that usually do not require medical attention (report to your doctor or other health care professional if they continue or are bothersome): -anxiety -headache -loss of appetite -muscle aches -nausea -night sweats This list may not describe all possible side effects. Call your doctor for medical advice about side effects. You may report side effects to FDA at 1-800-FDA-1088. Where should I keep my medicine? This drug is given  in a hospital or clinic and will not be stored at home. NOTE: This sheet is a summary. It may not cover all possible information. If you have questions about this medicine, talk to your doctor, pharmacist, or health care provider.  2015, Elsevier/Gold Standard. (2008-03-19 14:04:59) Acetaminophen tablets or caplets What is this medicine? ACETAMINOPHEN (a set a MEE noe fen) is a pain reliever. It is used to treat mild pain and fever. This medicine may be used for other purposes; ask your health care provider or pharmacist if you have questions. COMMON BRAND NAME(S): Aceta, Actamin, Anacin Aspirin Free, Genapap, Genebs, Mapap, Pain & Fever, Pain and Fever, PAIN RELIEF, PAIN RELIEF Extra Strength, Pain Reliever, Panadol, PHARBETOL, Q-Pap, Q-Pap Extra Strength, Tylenol, Tylenol CrushableTablet, Tylenol Extra Strength, XS No Aspirin, XS Pain Reliever What should I tell my health care provider before I take this medicine? They need to know if you have any of these conditions: -if you often drink alcohol -liver disease -an unusual or allergic reaction to acetaminophen, other medicines, foods, dyes, or preservatives -pregnant or trying to get pregnant -breast-feeding How should I use this medicine? Take this medicine by mouth with a glass of water. Follow the directions on the package or prescription label. Take your medicine at regular intervals. Do not take your medicine more often than directed. Talk to your pediatrician regarding the use of this medicine in children. While this drug may be prescribed for children as young as 59 years of age for selected conditions, precautions do apply. Overdosage: If you think you have taken too much of this medicine contact a poison control center or emergency room at once. NOTE: This medicine is only for you. Do not share this medicine with others. What if I miss a dose? If you miss a dose, take it as soon as you can. If it is almost time for your next dose, take  only that dose. Do not take double or extra doses. What may interact with this medicine? -alcohol -imatinib -isoniazid -other medicines with acetaminophen This list may not describe all possible interactions. Give your health care provider a list of all the medicines, herbs, non-prescription drugs, or dietary supplements you use. Also tell them if you smoke, drink alcohol, or use illegal drugs. Some items may interact with your medicine. What should I watch for while using this medicine? Tell your doctor or health care professional if the pain lasts more than 10  days (5 days for children), if it gets worse, or if there is a new or different kind of pain. Also, check with your doctor if a fever lasts for more than 3 days. Do not take other medicines that contain acetaminophen with this medicine. Always read labels carefully. If you have questions, ask your doctor or pharmacist. If you take too much acetaminophen get medical help right away. Too much acetaminophen can be very dangerous and cause liver damage. Even if you do not have symptoms, it is important to get help right away. What side effects may I notice from receiving this medicine? Side effects that you should report to your doctor or health care professional as soon as possible: -allergic reactions like skin rash, itching or hives, swelling of the face, lips, or tongue -breathing problems -fever or sore throat -redness, blistering, peeling or loosening of the skin, including inside the mouth -trouble passing urine or change in the amount of urine -unusual bleeding or bruising -unusually weak or tired -yellowing of the eyes or skin Side effects that usually do not require medical attention (report to your doctor or health care professional if they continue or are bothersome): -headache -nausea, stomach upset This list may not describe all possible side effects. Call your doctor for medical advice about side effects. You may report  side effects to FDA at 1-800-FDA-1088. Where should I keep my medicine? Keep out of reach of children. Store at room temperature between 20 and 25 degrees C (68 and 77 degrees F). Protect from moisture and heat. Throw away any unused medicine after the expiration date. NOTE: This sheet is a summary. It may not cover all possible information. If you have questions about this medicine, talk to your doctor, pharmacist, or health care provider.  2015, Elsevier/Gold Standard. (2013-03-13 12:54:16) Diphenhydramine capsules or tablets What is this medicine? DIPHENHYDRAMINE (dye fen HYE dra meen) is an antihistamine. It is used to treat the symptoms of an allergic reaction. It is also used to treat Parkinson's disease. This medicine is also used to prevent and to treat motion sickness and as a nighttime sleep aid. This medicine may be used for other purposes; ask your health care provider or pharmacist if you have questions. COMMON BRAND NAME(S): Alka-Seltzer Plus Allergy, Banophen, Benadryl Allergy, Benadryl Allergy Dye Free, Benadryl Allergy Kapgel, Benadryl Allergy Ultratab, Diphedryl, Diphenhist, Genahist, PHARBEDRYL, Q-Dryl, Gretta Began, Valu-Dryl, Vicks ZzzQuil Nightime Sleep-Aid What should I tell my health care provider before I take this medicine? They need to know if you have any of these conditions: -asthma or lung disease -glaucoma -high blood pressure or heart disease -liver disease -pain or difficulty passing urine -prostate trouble -ulcers or other stomach problems -an unusual or allergic reaction to diphenhydramine, other medicines foods, dyes, or preservatives such as sulfites -pregnant or trying to get pregnant -breast-feeding How should I use this medicine? Take this medicine by mouth with a full glass of water. Follow the directions on the prescription label. Take your doses at regular intervals. Do not take your medicine more often than directed. To prevent motion sickness start  taking this medicine 30 to 60 minutes before you leave. Talk to your pediatrician regarding the use of this medicine in children. Special care may be needed. Patients over 78 years old may have a stronger reaction and need a smaller dose. Overdosage: If you think you have taken too much of this medicine contact a poison control center or emergency room at once. NOTE: This medicine is only for  you. Do not share this medicine with others. What if I miss a dose? If you miss a dose, take it as soon as you can. If it is almost time for your next dose, take only that dose. Do not take double or extra doses. What may interact with this medicine? Do not take this medicine with any of the following medications: -MAOIs like Carbex, Eldepryl, Marplan, Nardil, and Parnate This medicine may also interact with the following medications: -alcohol -barbiturates, like phenobarbital -medicines for bladder spasm like oxybutynin, tolterodine -medicines for blood pressure -medicines for depression, anxiety, or psychotic disturbances -medicines for movement abnormalities or Parkinson's disease -medicines for sleep -other medicines for cold, cough or allergy -some medicines for the stomach like chlordiazepoxide, dicyclomine This list may not describe all possible interactions. Give your health care provider a list of all the medicines, herbs, non-prescription drugs, or dietary supplements you use. Also tell them if you smoke, drink alcohol, or use illegal drugs. Some items may interact with your medicine. What should I watch for while using this medicine? Visit your doctor or health care professional for regular check ups. Tell your doctor if your symptoms do not improve or if they get worse. Your mouth may get dry. Chewing sugarless gum or sucking hard candy, and drinking plenty of water may help. Contact your doctor if the problem does not go away or is severe. This medicine may cause dry eyes and blurred vision.  If you wear contact lenses you may feel some discomfort. Lubricating drops may help. See your eye doctor if the problem does not go away or is severe. You may get drowsy or dizzy. Do not drive, use machinery, or do anything that needs mental alertness until you know how this medicine affects you. Do not stand or sit up quickly, especially if you are an older patient. This reduces the risk of dizzy or fainting spells. Alcohol may interfere with the effect of this medicine. Avoid alcoholic drinks. What side effects may I notice from receiving this medicine? Side effects that you should report to your doctor or health care professional as soon as possible: -allergic reactions like skin rash, itching or hives, swelling of the face, lips, or tongue -changes in vision -confused, agitated, nervous -irregular or fast heartbeat -tremor -trouble passing urine -unusual bleeding or bruising -unusually weak or tired Side effects that usually do not require medical attention (report to your doctor or health care professional if they continue or are bothersome): -constipation, diarrhea -drowsy -headache -loss of appetite -stomach upset, vomiting -thick mucous This list may not describe all possible side effects. Call your doctor for medical advice about side effects. You may report side effects to FDA at 1-800-FDA-1088. Where should I keep my medicine? Keep out of the reach of children. Store at room temperature between 15 and 30 degrees C (59 and 86 degrees F). Keep container closed tightly. Throw away any unused medicine after the expiration date. NOTE: This sheet is a summary. It may not cover all possible information. If you have questions about this medicine, talk to your doctor, pharmacist, or health care provider.  2015, Elsevier/Gold Standard. (2007-11-07 17:06:22) Ondansetron tablets What is this medicine? ONDANSETRON (on DAN se tron) is used to treat nausea and vomiting caused by chemotherapy.  It is also used to prevent or treat nausea and vomiting after surgery. This medicine may be used for other purposes; ask your health care provider or pharmacist if you have questions. COMMON BRAND NAME(S): Zofran What should  I tell my health care provider before I take this medicine? They need to know if you have any of these conditions: -heart disease -history of irregular heartbeat -liver disease -low levels of magnesium or potassium in the blood -an unusual or allergic reaction to ondansetron, granisetron, other medicines, foods, dyes, or preservatives -pregnant or trying to get pregnant -breast-feeding How should I use this medicine? Take this medicine by mouth with a glass of water. Follow the directions on your prescription label. Take your doses at regular intervals. Do not take your medicine more often than directed. Talk to your pediatrician regarding the use of this medicine in children. Special care may be needed. Overdosage: If you think you have taken too much of this medicine contact a poison control center or emergency room at once. NOTE: This medicine is only for you. Do not share this medicine with others. What if I miss a dose? If you miss a dose, take it as soon as you can. If it is almost time for your next dose, take only that dose. Do not take double or extra doses. What may interact with this medicine? Do not take this medicine with any of the following medications: -apomorphine -certain medicines for fungal infections like fluconazole, itraconazole, ketoconazole, posaconazole, voriconazole -cisapride -dofetilide -dronedarone -pimozide -thioridazine -ziprasidone This medicine may also interact with the following medications: -carbamazepine -certain medicines for depression, anxiety, or psychotic disturbances -fentanyl -linezolid -MAOIs like Carbex, Eldepryl, Marplan, Nardil, and Parnate -methylene blue (injected into a vein) -other medicines that prolong the  QT interval (cause an abnormal heart rhythm) -phenytoin -rifampicin -tramadol This list may not describe all possible interactions. Give your health care provider a list of all the medicines, herbs, non-prescription drugs, or dietary supplements you use. Also tell them if you smoke, drink alcohol, or use illegal drugs. Some items may interact with your medicine. What should I watch for while using this medicine? Check with your doctor or health care professional right away if you have any sign of an allergic reaction. What side effects may I notice from receiving this medicine? Side effects that you should report to your doctor or health care professional as soon as possible: -allergic reactions like skin rash, itching or hives, swelling of the face, lips or tongue -breathing problems -confusion -dizziness -fast or irregular heartbeat -feeling faint or lightheaded, falls -fever and chills -loss of balance or coordination -seizures -sweating -swelling of the hands or feet -tightness in the chest -tremors -unusually weak or tired Side effects that usually do not require medical attention (report to your doctor or health care professional if they continue or are bothersome): -constipation or diarrhea -headache This list may not describe all possible side effects. Call your doctor for medical advice about side effects. You may report side effects to FDA at 1-800-FDA-1088. Where should I keep my medicine? Keep out of the reach of children. Store between 2 and 30 degrees C (36 and 86 degrees F). Throw away any unused medicine after the expiration date. NOTE: This sheet is a summary. It may not cover all possible information. If you have questions about this medicine, talk to your doctor, pharmacist, or health care provider.  2015, Elsevier/Gold Standard. (2013-04-26 16:27:45)

## 2014-10-23 ENCOUNTER — Telehealth (HOSPITAL_COMMUNITY): Payer: Self-pay | Admitting: Hematology & Oncology

## 2014-10-23 ENCOUNTER — Telehealth (HOSPITAL_COMMUNITY): Payer: Self-pay | Admitting: *Deleted

## 2014-10-23 ENCOUNTER — Encounter: Payer: Self-pay | Admitting: *Deleted

## 2014-10-23 ENCOUNTER — Encounter (HOSPITAL_BASED_OUTPATIENT_CLINIC_OR_DEPARTMENT_OTHER): Payer: Medicare HMO

## 2014-10-23 DIAGNOSIS — C8590 Non-Hodgkin lymphoma, unspecified, unspecified site: Secondary | ICD-10-CM

## 2014-10-23 DIAGNOSIS — C8580 Other specified types of non-Hodgkin lymphoma, unspecified site: Secondary | ICD-10-CM

## 2014-10-23 LAB — COMPREHENSIVE METABOLIC PANEL
ALBUMIN: 4.4 g/dL (ref 3.5–5.2)
ALT: 15 U/L (ref 0–53)
AST: 21 U/L (ref 0–37)
Alkaline Phosphatase: 81 U/L (ref 39–117)
Anion gap: 11 (ref 5–15)
BUN: 30 mg/dL — AB (ref 6–23)
CALCIUM: 9.5 mg/dL (ref 8.4–10.5)
CO2: 26 mmol/L (ref 19–32)
Chloride: 100 mmol/L (ref 96–112)
Creatinine, Ser: 2.37 mg/dL — ABNORMAL HIGH (ref 0.50–1.35)
GFR calc non Af Amer: 27 mL/min — ABNORMAL LOW (ref >90)
GFR, EST AFRICAN AMERICAN: 31 mL/min — AB (ref >90)
Glucose, Bld: 342 mg/dL — ABNORMAL HIGH (ref 70–99)
Potassium: 3.3 mmol/L — ABNORMAL LOW (ref 3.5–5.1)
Sodium: 137 mmol/L (ref 135–145)
TOTAL PROTEIN: 8.4 g/dL — AB (ref 6.0–8.3)
Total Bilirubin: 1.3 mg/dL — ABNORMAL HIGH (ref 0.3–1.2)

## 2014-10-23 LAB — CBC WITH DIFFERENTIAL/PLATELET
BASOS ABS: 0.1 10*3/uL (ref 0.0–0.1)
BASOS PCT: 1 % (ref 0–1)
EOS ABS: 0.3 10*3/uL (ref 0.0–0.7)
Eosinophils Relative: 5 % (ref 0–5)
HCT: 49.6 % (ref 39.0–52.0)
Hemoglobin: 17.4 g/dL — ABNORMAL HIGH (ref 13.0–17.0)
Lymphocytes Relative: 31 % (ref 12–46)
Lymphs Abs: 2.3 10*3/uL (ref 0.7–4.0)
MCH: 31 pg (ref 26.0–34.0)
MCHC: 35.1 g/dL (ref 30.0–36.0)
MCV: 88.4 fL (ref 78.0–100.0)
Monocytes Absolute: 0.7 10*3/uL (ref 0.1–1.0)
Monocytes Relative: 10 % (ref 3–12)
Neutro Abs: 3.9 10*3/uL (ref 1.7–7.7)
Neutrophils Relative %: 53 % (ref 43–77)
Platelets: 133 10*3/uL — ABNORMAL LOW (ref 150–400)
RBC: 5.61 MIL/uL (ref 4.22–5.81)
RDW: 14.6 % (ref 11.5–15.5)
WBC: 7.3 10*3/uL (ref 4.0–10.5)

## 2014-10-23 LAB — LACTATE DEHYDROGENASE: LDH: 170 U/L (ref 94–250)

## 2014-10-23 NOTE — Telephone Encounter (Signed)
MUST CONTACT [facility] [phone] (CSR) INTAKE TEAM FOR CHEMO AUTHS @ [phone]. DO NOT SEND TO [facility] OR [facility]! [facility] [phone]

## 2014-10-23 NOTE — Progress Notes (Signed)
[place] Psychosocial Distress Screening Clinical Social Work  Clinical Social Work was referred by distress screening protocol.  The patient scored a 5 on the Psychosocial Distress Thermometer which indicates mild distress. Clinical Social Worker met with pt at chemo class to assess for distress and other psychosocial needs. Pt's had concerns about finances and is aware to follow up with financial advocate whom she met today. Pt has CSW contact as well and is aware to reach out as needed.  ON[facility] DISTRESS SCREENING 10/23/2014  Screening Type Initial Screening  Distress experienced in past week (1-10) 5  Practical problem type (No Data)  Referral to clinical social work (No Data)  Referral to financial advocate Yes    Clinical Social Worker follow up needed: No.  If yes, follow up plan:  Loren Racer, [facility] Phone: [phone] Fax: [phone]

## 2014-10-23 NOTE — Progress Notes (Signed)
[place] Clinical Social Work  Clinical Social Work met with pt and his family at [facility] to review role of CSW and explain Arts development officer available to assist during cancer treatment. CSW provided pt and family with handouts re. role of CSW and Support Services calendar. Pt and family are aware to reach out to CSW as needed.   Clinical Social Work interventions: Education   Loren Racer, [place] Tuesdays 8:30-1pm Wednesdays 8:30-12pm  [phone]

## 2014-10-23 NOTE — Addendum Note (Signed)
Addended by: Gerhard Perches on: 10/23/2014 11:36 AM   Modules accepted: Orders

## 2014-10-23 NOTE — Progress Notes (Signed)
Teaching regarding Rituxan done and consent signed. Zofran called into mail order pharmacy yesterday. Calendar given to patient.   [name] [name] presented for labwork. Labs per MD order drawn via Peripheral Line 23 gauge needle inserted in LT AC Good blood return present. Procedure without incident.  Needle removed intact. Patient tolerated procedure well.

## 2014-10-23 NOTE — Progress Notes (Signed)
Labs drawn by RN 

## 2014-10-23 NOTE — Telephone Encounter (Signed)
Consistent blood sugar of 180-220 in am upon arising. Patient takes Lantus 51 units in am. Takes between 3-7 units three times a day as needed for high blood sugar. Hasn't taken Lipitor in approximately 3+ months because blood sugar was running around 500 in the am. Patient was switched from Crestor to Lipitor and when this happened the blood sugars in the am were approx 500 so pt quit taking Lipitor. [facility] [place]. Patient has an appt with PCP this evening for a "cold". I asked him to mention his blood sugar levels and Lipitor to his physician this evening. He said he would.

## 2014-10-24 LAB — HEPATITIS B CORE ANTIBODY, IGM: Hep B C IgM: NONREACTIVE

## 2014-10-24 LAB — HEPATITIS B SURFACE ANTIGEN: Hepatitis B Surface Ag: NEGATIVE

## 2014-10-31 ENCOUNTER — Encounter (HOSPITAL_COMMUNITY): Payer: Self-pay | Admitting: Hematology & Oncology

## 2014-10-31 ENCOUNTER — Encounter (HOSPITAL_BASED_OUTPATIENT_CLINIC_OR_DEPARTMENT_OTHER): Payer: Medicare HMO

## 2014-10-31 ENCOUNTER — Encounter (HOSPITAL_COMMUNITY): Payer: Self-pay

## 2014-10-31 DIAGNOSIS — C8590 Non-Hodgkin lymphoma, unspecified, unspecified site: Secondary | ICD-10-CM | POA: Diagnosis not present

## 2014-10-31 DIAGNOSIS — C8580 Other specified types of non-Hodgkin lymphoma, unspecified site: Secondary | ICD-10-CM

## 2014-10-31 LAB — CBC WITH DIFFERENTIAL/PLATELET
BASOS ABS: 0 10*3/uL (ref 0.0–0.1)
BASOS PCT: 1 % (ref 0–1)
EOS PCT: 4 % (ref 0–5)
Eosinophils Absolute: 0.3 10*3/uL (ref 0.0–0.7)
HCT: 42.8 % (ref 39.0–52.0)
HEMOGLOBIN: 15 g/dL (ref 13.0–17.0)
LYMPHS ABS: 2 10*3/uL (ref 0.7–4.0)
Lymphocytes Relative: 32 % (ref 12–46)
MCH: 30.4 pg (ref 26.0–34.0)
MCHC: 35 g/dL (ref 30.0–36.0)
MCV: 86.8 fL (ref 78.0–100.0)
Monocytes Absolute: 0.4 10*3/uL (ref 0.1–1.0)
Monocytes Relative: 7 % (ref 3–12)
Neutro Abs: 3.5 10*3/uL (ref 1.7–7.7)
Neutrophils Relative %: 56 % (ref 43–77)
Platelets: 125 10*3/uL — ABNORMAL LOW (ref 150–400)
RBC: 4.93 MIL/uL (ref 4.22–5.81)
RDW: 14.5 % (ref 11.5–15.5)
WBC: 6.2 10*3/uL (ref 4.0–10.5)

## 2014-10-31 LAB — COMPREHENSIVE METABOLIC PANEL
ALT: 16 U/L (ref 0–53)
ANION GAP: 10 (ref 5–15)
AST: 21 U/L (ref 0–37)
Albumin: 3.8 g/dL (ref 3.5–5.2)
Alkaline Phosphatase: 78 U/L (ref 39–117)
BUN: 44 mg/dL — ABNORMAL HIGH (ref 6–23)
CALCIUM: 8.9 mg/dL (ref 8.4–10.5)
CO2: 23 mmol/L (ref 19–32)
Chloride: 102 mmol/L (ref 96–112)
Creatinine, Ser: 2.66 mg/dL — ABNORMAL HIGH (ref 0.50–1.35)
GFR calc non Af Amer: 23 mL/min — ABNORMAL LOW (ref >90)
GFR, EST AFRICAN AMERICAN: 27 mL/min — AB (ref >90)
Glucose, Bld: 330 mg/dL — ABNORMAL HIGH (ref 70–99)
POTASSIUM: 3.2 mmol/L — AB (ref 3.5–5.1)
Sodium: 135 mmol/L (ref 135–145)
Total Bilirubin: 0.6 mg/dL (ref 0.3–1.2)
Total Protein: 7.3 g/dL (ref 6.0–8.3)

## 2014-10-31 LAB — LACTATE DEHYDROGENASE: LDH: 158 U/L (ref 94–250)

## 2014-10-31 MED ORDER — DIPHENHYDRAMINE HCL 25 MG PO CAPS
ORAL_CAPSULE | ORAL | Status: AC
  Filled 2014-10-31: qty 2

## 2014-10-31 MED ORDER — ACETAMINOPHEN 325 MG PO TABS
650.0000 mg | ORAL_TABLET | Freq: Once | ORAL | Status: AC
  Administered 2014-10-31: 650 mg via ORAL

## 2014-10-31 MED ORDER — SODIUM CHLORIDE 0.9 % IV SOLN
Freq: Once | INTRAVENOUS | Status: AC
  Administered 2014-10-31: 12:00:00 via INTRAVENOUS

## 2014-10-31 MED ORDER — SODIUM CHLORIDE 0.9 % IV SOLN
375.0000 mg/m2 | Freq: Once | INTRAVENOUS | Status: AC
  Administered 2014-10-31: 1000 mg via INTRAVENOUS
  Filled 2014-10-31: qty 100

## 2014-10-31 MED ORDER — SODIUM CHLORIDE 0.9 % IJ SOLN: 10.0000 mL | INTRAMUSCULAR | Status: DC | PRN

## 2014-10-31 MED ORDER — ACETAMINOPHEN 325 MG PO TABS
ORAL_TABLET | ORAL | Status: AC
  Filled 2014-10-31: qty 2

## 2014-10-31 MED ORDER — DIPHENHYDRAMINE HCL 25 MG PO CAPS
50.0000 mg | ORAL_CAPSULE | Freq: Once | ORAL | Status: AC
Start: 2014-10-31 — End: 2014-10-31
  Administered 2014-10-31: 50 mg via ORAL

## 2014-10-31 NOTE — Progress Notes (Signed)
Tolerated infusion w/o adverse reaction; VSS; in no distress; discharged via wheelchair; left in c/o daughter for transport home.

## 2014-10-31 NOTE — Patient Instructions (Addendum)
[facility] Discharge Instructions for Patients Receiving Chemotherapy  Today you received the following chemotherapy agents:  Rituxan Someone will contact you tomorrow to see how you are doing. Call the clinic with any questions or concerns.  If you develop nausea and vomiting, or diarrhea that is not controlled by your medication, call the clinic.  The clinic phone number is [phone]. Office hours are Monday-Friday 8:30am-5:00pm.  BELOW ARE SYMPTOMS THAT SHOULD BE REPORTED IMMEDIATELY:  *FEVER GREATER THAN 101.0 F  *CHILLS WITH OR WITHOUT FEVER  NAUSEA AND VOMITING THAT IS NOT CONTROLLED WITH YOUR NAUSEA MEDICATION  *UNUSUAL SHORTNESS OF BREATH  *UNUSUAL BRUISING OR BLEEDING  TENDERNESS IN MOUTH AND THROAT WITH OR WITHOUT PRESENCE OF ULCERS  *URINARY PROBLEMS  *BOWEL PROBLEMS  UNUSUAL RASH Items with * indicate a potential emergency and should be followed up as soon as possible. If you have an emergency after office hours please contact your primary care physician or go to the nearest emergency department.  Please call the clinic during office hours if you have any questions or concerns.   You may also contact the Patient Navigator at [phone] should you have any questions or need assistance in obtaining follow up care. _____________________________________________________________________ Have you asked about our STAR program?    STAR stands for Survivorship Training and Rehabilitation, and this is a nationally recognized cancer care program that focuses on survivorship and rehabilitation.  Cancer and cancer treatments may cause problems, such as, pain, making you feel tired and keeping you from doing the things that you need or want to do. Cancer rehabilitation can help. Our goal is to reduce these troubling effects and help you have the best quality of life possible.  You may receive a survey from a nurse that asks questions about your current  state of health.  Based on the survey results, all eligible patients will be referred to the [facility] program for an evaluation so we can better serve you! A frequently asked questions sheet is available upon request.          Rituximab injection What is this medicine? RITUXIMAB (ri TUX i mab) is a monoclonal antibody. This medicine changes the way the body's immune system works. It is used commonly to treat non-Hodgkin's lymphoma and other conditions. In cancer cells, this drug targets a specific protein within cancer cells and stops the cancer cells from growing. It is also used to treat rhuematoid arthritis (RA). In RA, this medicine slow the inflammatory process and help reduce joint pain and swelling. This medicine is often used with other cancer or arthritis medications. This medicine may be used for other purposes; ask your health care provider or pharmacist if you have questions. COMMON BRAND NAME(S): Rituxan What should I tell my health care provider before I take this medicine? They need to know if you have any of these conditions: -blood disorders -heart disease -history of hepatitis B -infection (especially a virus infection such as chickenpox, cold sores, or herpes) -irregular heartbeat -kidney disease -lung or breathing disease, like asthma -lupus -an unusual or allergic reaction to rituximab, mouse proteins, other medicines, foods, dyes, or preservatives -pregnant or trying to get pregnant -breast-feeding How should I use this medicine? This medicine is for infusion into a vein. It is administered in a hospital or clinic by a specially trained health care professional. A special MedGuide will be given to you by the pharmacist with each prescription and refill. Be sure to read this information carefully  each time. Talk to your pediatrician regarding the use of this medicine in children. This medicine is not approved for use in children. Overdosage: If you think you have  taken too much of this medicine contact a poison control center or emergency room at once. NOTE: This medicine is only for you. Do not share this medicine with others. What if I miss a dose? It is important not to miss a dose. Call your doctor or health care professional if you are unable to keep an appointment. What may interact with this medicine? -cisplatin -medicines for blood pressure -some other medicines for arthritis -vaccines This list may not describe all possible interactions. Give your health care provider a list of all the medicines, herbs, non-prescription drugs, or dietary supplements you use. Also tell them if you smoke, drink alcohol, or use illegal drugs. Some items may interact with your medicine. What should I watch for while using this medicine? Report any side effects that you notice during your treatment right away, such as changes in your breathing, fever, chills, dizziness or lightheadedness. These effects are more common with the first dose. Visit your prescriber or health care professional for checks on your progress. You will need to have regular blood work. Report any other side effects. The side effects of this medicine can continue after you finish your treatment. Continue your course of treatment even though you feel ill unless your doctor tells you to stop. Call your doctor or health care professional for advice if you get a fever, chills or sore throat, or other symptoms of a cold or flu. Do not treat yourself. This drug decreases your body's ability to fight infections. Try to avoid being around people who are sick. This medicine may increase your risk to bruise or bleed. Call your doctor or health care professional if you notice any unusual bleeding. Be careful brushing and flossing your teeth or using a toothpick because you may get an infection or bleed more easily. If you have any dental work done, tell your dentist you are receiving this medicine. Avoid taking  products that contain aspirin, acetaminophen, ibuprofen, naproxen, or ketoprofen unless instructed by your doctor. These medicines may hide a fever. Do not become pregnant while taking this medicine. Women should inform their doctor if they wish to become pregnant or think they might be pregnant. There is a potential for serious side effects to an unborn child. Talk to your health care professional or pharmacist for more information. Do not breast-feed an infant while taking this medicine. What side effects may I notice from receiving this medicine? Side effects that you should report to your doctor or health care professional as soon as possible: -allergic reactions like skin rash, itching or hives, swelling of the face, lips, or tongue -low blood counts - this medicine may decrease the number of white blood cells, red blood cells and platelets. You may be at increased risk for infections and bleeding. -signs of infection - fever or chills, cough, sore throat, pain or difficulty passing urine -signs of decreased platelets or bleeding - bruising, pinpoint red spots on the skin, black, tarry stools, blood in the urine -signs of decreased red blood cells - unusually weak or tired, fainting spells, lightheadedness -breathing problems -confused, not responsive -chest pain -fast, irregular heartbeat -feeling faint or lightheaded, falls -mouth sores -redness, blistering, peeling or loosening of the skin, including inside the mouth -stomach pain -swelling of the ankles, feet, or hands -trouble passing urine or change in  the amount of urine Side effects that usually do not require medical attention (report to your doctor or other health care professional if they continue or are bothersome): -anxiety -headache -loss of appetite -muscle aches -nausea -night sweats This list may not describe all possible side effects. Call your doctor for medical advice about side effects. You may report side effects  to FDA at 1-800-FDA-1088. Where should I keep my medicine? This drug is given in a hospital or clinic and will not be stored at home. NOTE: This sheet is a summary. It may not cover all possible information. If you have questions about this medicine, talk to your doctor, pharmacist, or health care provider.  2015, Elsevier/Gold Standard. (2008-03-19 14:04:59)

## 2014-11-01 ENCOUNTER — Telehealth (HOSPITAL_COMMUNITY): Payer: Self-pay | Admitting: *Deleted

## 2014-11-01 NOTE — Telephone Encounter (Signed)
Patient is doing good today. He says he is very sleepy. But he states that he didn't sleep good last night and that he got up early this morning. I reminded patient to call me tomorrow if he needed anything and he said all right.

## 2014-11-07 ENCOUNTER — Encounter (HOSPITAL_BASED_OUTPATIENT_CLINIC_OR_DEPARTMENT_OTHER): Payer: Medicare HMO | Admitting: Hematology & Oncology

## 2014-11-07 ENCOUNTER — Encounter (HOSPITAL_COMMUNITY): Payer: Self-pay | Admitting: Hematology & Oncology

## 2014-11-07 ENCOUNTER — Encounter (HOSPITAL_COMMUNITY): Payer: Medicare HMO | Attending: Hematology & Oncology

## 2014-11-07 ENCOUNTER — Encounter (HOSPITAL_COMMUNITY): Payer: Self-pay

## 2014-11-07 ENCOUNTER — Ambulatory Visit (HOSPITAL_COMMUNITY): Payer: Medicare HMO | Admitting: Hematology & Oncology

## 2014-11-07 VITALS — BP 139/77 | HR 68 | Temp 98.0°F | Resp 18 | Wt 322.6 lb

## 2014-11-07 DIAGNOSIS — C8580 Other specified types of non-Hodgkin lymphoma, unspecified site: Secondary | ICD-10-CM

## 2014-11-07 DIAGNOSIS — Z5112 Encounter for antineoplastic immunotherapy: Secondary | ICD-10-CM

## 2014-11-07 DIAGNOSIS — C8590 Non-Hodgkin lymphoma, unspecified, unspecified site: Secondary | ICD-10-CM

## 2014-11-07 LAB — COMPREHENSIVE METABOLIC PANEL
ALT: 15 U/L (ref 0–53)
AST: 21 U/L (ref 0–37)
Albumin: 3.9 g/dL (ref 3.5–5.2)
Alkaline Phosphatase: 77 U/L (ref 39–117)
Anion gap: 10 (ref 5–15)
BUN: 34 mg/dL — ABNORMAL HIGH (ref 6–23)
CALCIUM: 8.9 mg/dL (ref 8.4–10.5)
CO2: 25 mmol/L (ref 19–32)
Chloride: 101 mmol/L (ref 96–112)
Creatinine, Ser: 2.39 mg/dL — ABNORMAL HIGH (ref 0.50–1.35)
GFR, EST AFRICAN AMERICAN: 31 mL/min — AB (ref >90)
GFR, EST NON AFRICAN AMERICAN: 26 mL/min — AB (ref >90)
Glucose, Bld: 250 mg/dL — ABNORMAL HIGH (ref 70–99)
Potassium: 3.3 mmol/L — ABNORMAL LOW (ref 3.5–5.1)
SODIUM: 136 mmol/L (ref 135–145)
Total Bilirubin: 0.9 mg/dL (ref 0.3–1.2)
Total Protein: 7.5 g/dL (ref 6.0–8.3)

## 2014-11-07 LAB — CBC WITH DIFFERENTIAL/PLATELET
BASOS ABS: 0.1 10*3/uL (ref 0.0–0.1)
BASOS PCT: 1 % (ref 0–1)
Eosinophils Absolute: 0.4 10*3/uL (ref 0.0–0.7)
Eosinophils Relative: 5 % (ref 0–5)
HEMATOCRIT: 46 % (ref 39.0–52.0)
HEMOGLOBIN: 15.7 g/dL (ref 13.0–17.0)
LYMPHS PCT: 29 % (ref 12–46)
Lymphs Abs: 2.3 10*3/uL (ref 0.7–4.0)
MCH: 29.8 pg (ref 26.0–34.0)
MCHC: 34.1 g/dL (ref 30.0–36.0)
MCV: 87.5 fL (ref 78.0–100.0)
Monocytes Absolute: 0.8 10*3/uL (ref 0.1–1.0)
Monocytes Relative: 10 % (ref 3–12)
NEUTROS ABS: 4.3 10*3/uL (ref 1.7–7.7)
NEUTROS PCT: 55 % (ref 43–77)
Platelets: 139 10*3/uL — ABNORMAL LOW (ref 150–400)
RBC: 5.26 MIL/uL (ref 4.22–5.81)
RDW: 14.9 % (ref 11.5–15.5)
WBC: 7.8 10*3/uL (ref 4.0–10.5)

## 2014-11-07 LAB — LACTATE DEHYDROGENASE: LDH: 170 U/L (ref 94–250)

## 2014-11-07 MED ORDER — ACETAMINOPHEN 325 MG PO TABS
ORAL_TABLET | ORAL | Status: AC
  Filled 2014-11-07: qty 2

## 2014-11-07 MED ORDER — SODIUM CHLORIDE 0.9 % IJ SOLN: 10.0000 mL | INTRAMUSCULAR | Status: DC | PRN

## 2014-11-07 MED ORDER — SODIUM CHLORIDE 0.9 % IV SOLN
375.0000 mg/m2 | Freq: Once | INTRAVENOUS | Status: AC
  Administered 2014-11-07: 1000 mg via INTRAVENOUS
  Filled 2014-11-07: qty 100

## 2014-11-07 MED ORDER — SODIUM CHLORIDE 0.9 % IV SOLN
Freq: Once | INTRAVENOUS | Status: AC
  Administered 2014-11-07: 09:00:00 via INTRAVENOUS

## 2014-11-07 MED ORDER — ACETAMINOPHEN 325 MG PO TABS
650.0000 mg | ORAL_TABLET | Freq: Once | ORAL | Status: AC
  Administered 2014-11-07: 650 mg via ORAL

## 2014-11-07 MED ORDER — DIPHENHYDRAMINE HCL 25 MG PO CAPS
50.0000 mg | ORAL_CAPSULE | Freq: Once | ORAL | Status: AC
  Administered 2014-11-07: 50 mg via ORAL

## 2014-11-07 MED ORDER — DIPHENHYDRAMINE HCL 25 MG PO CAPS
ORAL_CAPSULE | ORAL | Status: AC
  Filled 2014-11-07: qty 2

## 2014-11-07 NOTE — Patient Instructions (Signed)
[place] at [facility] Discharge Instructions  RECOMMENDATIONS MADE BY THE CONSULTANT AND ANY TEST RESULTS WILL BE SENT TO YOUR REFERRING PHYSICIAN.  You received chemo today. Dr. Whitney Muse will see you in two weeks for a follow up. Call for any questions or concerns.  Thank you for choosing [place] at [facility] to provide your oncology and hematology care.  To afford each patient quality time with our provider, please arrive at least 15 minutes before your scheduled appointment time.    You need to re-schedule your appointment should you arrive 10 or more minutes late.  We strive to give you quality time with our providers, and arriving late affects you and other patients whose appointments are after yours.  Also, if you no show three or more times for appointments you may be dismissed from the clinic at the providers discretion.     Again, thank you for choosing [facility].  Our hope is that these requests will decrease the amount of time that you wait before being seen by our physicians.       _____________________________________________________________  Should you have questions after your visit to [facility], please contact our office at [phone] between the hours of 8:30 a.m. and 4:30 p.m.  Voicemails left after 4:30 p.m. will not be returned until the following business day.  For prescription refill requests, have your pharmacy contact our office.

## 2014-11-07 NOTE — Patient Instructions (Signed)
[facility] Discharge Instructions for Patients Receiving Chemotherapy  Today you received Rituxan infusion. Return for treatment as scheduled next week. Treatment and office visit in 2 weeks.    If you develop nausea and vomiting, or diarrhea that is not controlled by your medication, call the clinic.  The clinic phone number is [phone]. Office hours are Monday-Friday 8:30am-5:00pm.  BELOW ARE SYMPTOMS THAT SHOULD BE REPORTED IMMEDIATELY:  *FEVER GREATER THAN 101.0 F  *CHILLS WITH OR WITHOUT FEVER  NAUSEA AND VOMITING THAT IS NOT CONTROLLED WITH YOUR NAUSEA MEDICATION  *UNUSUAL SHORTNESS OF BREATH  *UNUSUAL BRUISING OR BLEEDING  TENDERNESS IN MOUTH AND THROAT WITH OR WITHOUT PRESENCE OF ULCERS  *URINARY PROBLEMS  *BOWEL PROBLEMS  UNUSUAL RASH Items with * indicate a potential emergency and should be followed up as soon as possible. If you have an emergency after office hours please contact your primary care physician or go to the nearest emergency department.  Please call the clinic during office hours if you have any questions or concerns.   You may also contact the Patient Navigator at [phone] should you have any questions or need assistance in obtaining follow up care. _____________________________________________________________________ Have you asked about our STAR program?    STAR stands for Survivorship Training and Rehabilitation, and this is a nationally recognized cancer care program that focuses on survivorship and rehabilitation.  Cancer and cancer treatments may cause problems, such as, pain, making you feel tired and keeping you from doing the things that you need or want to do. Cancer rehabilitation can help. Our goal is to reduce these troubling effects and help you have the best quality of life possible.  You may receive a survey from a nurse that asks questions about your current state of health.  Based on the survey results, all  eligible patients will be referred to the [facility] program for an evaluation so we can better serve you! A frequently asked questions sheet is available upon request.

## 2014-11-07 NOTE — Progress Notes (Signed)
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 male is here because of newly diagnosed marginal zone lymphoma.  He was admitted to [facility] in December 2015 with abdominal pain. The pain was described as severe. He also reported episodes of vomiting. At presentation he had elevated liver function tests and bilirubin, CT scan of the abdomen and pelvis showed a possible bladder mass abutting the colon.  He underwent a colonoscopy on 09/03/2014 with Dr. Oneida Alar. No mass was noted on exam. 6 polyps were removed all without dysplasia on final pathology, and a redundant left colon was noted. He ultimately underwent a CT-guided biopsy of the pelvic mass on February 25, with final pathology consistent with a low-grade non-Hodgkin's lymphoma, marginal zone type.  Based on the location of his lesion we opted to proceed with single agent Rituxan therapy. He has completed his first treatment and has done very well. He states "I did not even know I got it." He is eating well, energy level is good.    Marginal zone lymphoma   07/23/2014 Imaging CT C/A/P with soft tissue mass in pelvis, LN adjacent to distal esophagus, upper abdomen a partially calcified soft tissue mass   09/27/2014 Initial Biopsy CT guided biopsy     MEDICAL HISTORY:  Past Medical History  Diagnosis Date  . Peripheral vascular disease     neuropathy in toes   . Shortness of breath     due ot pain in knees   . Sleep apnea     sleep  study 3/29 13 ? location   . Pneumonia     hx of   . Diabetes mellitus   . Hypothyroidism   . Chronic kidney disease     stage III kidney disease   . Arthritis     knees, back   . S/P biopsy     SURGICAL HISTORY: Past Surgical History  Procedure Laterality Date  . Cholecystectomy  1992  . Total knee arthroplasty  11/10/2011    Procedure: TOTAL KNEE ARTHROPLASTY;  Surgeon: Mauri Pole, MD;  Location: [facility];  Service: Orthopedics;  Laterality: Right;  . Colonoscopy N/A 09/03/2014    Procedure: COLONOSCOPY;  Surgeon: Danie Binder, MD;  [facility];  Service: Endoscopy;  Laterality: N/A;  12:30 PM  . Esophagogastroduodenoscopy N/A 09/03/2014    Procedure: ESOPHAGOGASTRODUODENOSCOPY (EGD);  Surgeon: Danie Binder, MD;  Location: [facility];  Service: Endoscopy;  Laterality: N/A;    SOCIAL HISTORY: History   Social History  . Marital Status: Divorced    Spouse Name: N/A  . Number of Children: N/A  . Years of Education: N/A   Occupational History  . Not on file.   Social History Main Topics  . Smoking status: Former Smoker -- 1.00 packs/day for 30 years    Quit date: 08/04/2007  . Smokeless tobacco: Never Used  . Alcohol Use: No  . Drug Use: No  . Sexual Activity: Not on file   Other Topics Concern  . Not on  file   Social History Narrative    FAMILY HISTORY: Family History  Problem Relation Age of Onset  . Colon cancer Neg Hx    has no family status information on file.   ALLERGIES:  has No Known Allergies.  MEDICATIONS:  Current Outpatient Prescriptions  Medication Sig Dispense Refill  . aspirin 81 MG tablet Take 81 mg by mouth daily.    . ferrous sulfate 325 (65 FE) MG tablet Take 1 tablet (325 mg total) by mouth 3 (three) times daily after meals. (Patient not taking: Reported on 07/23/2014)    . GuaiFENesin (MUCINEX PO) Take by mouth. A liquid, did not know correct dose    . Insulin Glargine (LANTUS SOLOSTAR) 100 UNIT/ML Solostar Pen Inject  51 Units into the skin daily. Pt takes in the AM    . insulin lispro (HUMALOG KWIKPEN) 100 UNIT/ML KiwkPen Inject 3-7 Units into the skin 3 (three) times daily as needed.     Marland Kitchen levothyroxine (SYNTHROID, LEVOTHROID) 300 MCG tablet Take 300 mcg by mouth every morning.    Marland Kitchen losartan (COZAAR) 100 MG tablet Take 100 mg by mouth every morning.    . ondansetron (ZOFRAN) 8 MG tablet Take 1 tablet (8 mg total) by mouth every 8 (eight) hours as needed for nausea or vomiting. (Patient not taking: Reported on 11/07/2014) 30 tablet 2  . triamcinolone (NASACORT) 55 MCG/ACT AERO nasal inhaler Place 2 sprays into the nose daily as needed (allergies).    Marland Kitchen UNABLE TO FIND Apply 1 application topically as needed. Allergy cream    . UNABLE TO FIND Apply 1 application topically as needed. Diabetic skin relief foot cream    . verapamil (CALAN-SR) 180 MG CR tablet Take 180 mg by mouth daily.     No current facility-administered medications for this visit.   Facility-Administered Medications Ordered in Other Visits  Medication Dose Route Frequency Provider Last Rate Last Dose  . sodium chloride 0.9 % injection 10 mL  10 mL Intracatheter PRN Patrici Ranks, MD        Review of Systems  Constitutional: Negative for fever, chills, weight loss and malaise/fatigue.  HENT: Negative for congestion, hearing loss, nosebleeds, sore throat and tinnitus.   Eyes: Negative for blurred vision, double vision, pain and discharge.  Respiratory: Negative for cough, hemoptysis, sputum production, shortness of breath and wheezing.   Cardiovascular: Negative for chest pain, palpitations, claudication, leg swelling and PND.  Gastrointestinal: Negative for heartburn, nausea, vomiting, abdominal pain, diarrhea, constipation, blood in stool and melena.  Genitourinary: Negative for dysuria, urgency, frequency and hematuria.  Musculoskeletal: Negative for myalgias, joint pain and falls.  Skin: Negative for itching and rash.  Neurological:  Negative for dizziness, tingling, tremors, sensory change, speech change, focal weakness, seizures, loss of consciousness, weakness and headaches.  Endo/Heme/Allergies: Does not bruise/bleed easily.  Psychiatric/Behavioral: Negative for depression, suicidal ideas, memory loss and substance abuse. The patient is not nervous/anxious and does not have insomnia.     PHYSICAL EXAMINATION: ECOG PERFORMANCE STATUS: 0 - Asymptomatic  Filed Vitals:   11/07/14 0905  BP: 139/77  Pulse: 68  Temp: 98 F (36.7 C)  Resp: 18   Filed Weights   11/07/14 0905  Weight: 322 lb 9.6 oz (146.33 kg)     Physical Exam  Constitutional: He is oriented to person, place, and time and well-developed, well-nourished, and in no distress.  Obese  HENT:  Head: Normocephalic and atraumatic.  Nose: Nose normal.  Mouth/Throat: Oropharynx is clear and moist.  No oropharyngeal exudate.  Eyes: Conjunctivae and EOM are normal. Pupils are equal, round, and reactive to light. Right eye exhibits no discharge. Left eye exhibits no discharge. No scleral icterus.  Neck: Normal range of motion. Neck supple. No tracheal deviation present. No thyromegaly present.  Cardiovascular: Normal rate, regular rhythm and normal heart sounds.  Exam reveals no gallop and no friction rub.   No murmur heard. Pulmonary/Chest: Effort normal and breath sounds normal. He has no wheezes. He has no rales.  Abdominal: Soft. Bowel sounds are normal. He exhibits no distension and no mass. There is no tenderness. There is no rebound and no guarding.  Musculoskeletal: Normal range of motion. He exhibits no edema.  Chronic LE skin/vascular changes  Lymphadenopathy:    He has no cervical adenopathy.  Neurological: He is alert and oriented to person, place, and time. He has normal reflexes. No cranial nerve deficit. Gait normal. Coordination normal.  Skin: Skin is warm and dry. No rash noted.  Psychiatric: Mood, memory, affect and judgment normal.    Nursing note and vitals reviewed.    LABORATORY DATA:  I have reviewed the data as listed Lab Results  Component Value Date   WBC 7.8 11/07/2014   HGB 15.7 11/07/2014   HCT 46.0 11/07/2014   MCV 87.5 11/07/2014   PLT 139* 11/07/2014     Chemistry      Component Value Date/Time   NA 136 11/07/2014 0915   K 3.3* 11/07/2014 0915   CL 101 11/07/2014 0915   CO2 25 11/07/2014 0915   BUN 34* 11/07/2014 0915   CREATININE 2.39* 11/07/2014 0915      Component Value Date/Time   CALCIUM 8.9 11/07/2014 0915   ALKPHOS 77 11/07/2014 0915   AST 21 11/07/2014 0915   ALT 15 11/07/2014 0915   BILITOT 0.9 11/07/2014 0915     Soft tissue mass, biopsy, adjacent to urinary bladder - ATYPICAL LYMPHOID PROLIFERATION CONSISTENT WITH NON-HODGKIN'S B-CELL LYMPHOMA. - SEE ONCOLOGY TABLE. Histologic type: Non-Hodgkin's lymphoma, see comment. Grade (if applicable): Low grade. Flow cytometry: No tissue is available for analysis since the specimen was received in formalin. Immunohistochemical stains: BCL-6, CD3, CD5, CD10, CD20, CD21, CD34, CD43, CD79a, CD138, Cyclin D-1, kappa, lambda, TdT, and Ki-67 with appropriate controls. Touch preps/imprints: Not performed. Comments: The sections show needle core biopsy of soft tissue displaying a very dense and relatively monomorphic infiltrate of small lymphoid cells with high nuclear cytoplasmic ratio, round to irregular nuclei, dense chromatin, and small to inconspicuous nucleoli. The appearance is diffuse with lack of obvious follicular structures or proliferation centers. No necrosis or conspicuous mitosis is identified. To further evaluate this process, immunohistochemical stains were performed and show that the overwhelming majority of lymphocytes consist of B-cells, as highlighted with CD20 and CD79a. B-lymphocytes show no significant staining with BCL-6, CD10, CD5, Cyclin D-1, CD34, or TdT. CD21 highlights scattered small foci of dendritic networks  throughout the core biopsies. CD138 highlights a minor plasma cell component, which consist of scattered cells and variably sized but predominantly small clusters. Kappa and lambda stains failed to show any significant staining and are considered non-contributory. Ki-67 shows very low expression (less than 5%). There is an admixed minor T-cell component present as seen with CD3, CD5, and CD43. The overall features are consistent with low grade non-Hodgkin's B-cell lymphoma and the overall phenotypic features favor marginal zone type. Clinical correlation is strongly recommended. (BNS:ds 10/01/14)   RADIOGRAPHIC STUDIES: I have personally reviewed the radiological images as listed  and agreed with the findings in the report.   CLINICAL DATA: Upper abdominal pain, possible periesophageal lymph node at incomplete imaging of the lung bases on CT abdomen pelvis performed earlier today.  EXAM: CT CHEST WITHOUT CONTRAST/CT ABDOMEN     IMPRESSION: Mild subcarinal lymphadenopathy. This is amenable to further evaluation at presumed global staging at PET-CT performed on a nonemergent outpatient basis, pending on results of presumed pending cystoscopy for a previously reported bladder mass. This could be reactive although metastatic lymphadenopathy could appear similar.  Patchy areas of pulmonary parenchymal nodularity with a vague tree-in-bud type appearance most typical for small airways infection. This is also amenable to followup at presumed future restaging studies.   Electronically Signed  By: Conchita Paris M.D.  On: 07/23/2014 19:52  IMPRESSION: Soft tissue mass intimately opposed to the superior and left wall of the urinary bladder. It is uncertain whether this arises from the bladder but felt to be most likely. Periaortic and iliac chain lymph nodes are noted. Further evaluation is recommended.  The abnormality seen on recent plain film in the chest is not  well visualized on this exam.  Likely lymph node adjacent to the distal esophagus. This is incompletely evaluated as it is only seen on the first image. CT of the chest may be helpful for further evaluation.  Although not mentioned in the body of the report, In the upper abdominal cavity just below the xiphoid process, there is a partially calcified soft tissue lesion. This is of uncertain significance. The possibility of a peritoneal lesion cannot be totally excluded.   Electronically Signed  By: Inez Catalina M.D.  On: 07/23/2014 17:36   ASSESSMENT & PLAN:   Stage II marginal zone lymphoma  He has done well so far with single agent Rituxan. His disease is indolent, but based upon the location of the pelvic tumor (ie. Between the bladder and rectum) we opted to proceed with single agent Rituxan. The plan will be to complete 4 weekly treatments and then one treatment every 2 months. Discuss reimaging him in several months. I will see him back again in 2 weeks. We will continue forward with therapy as planned.  All questions were answered. The patient knows to call the clinic with any problems, questions or concerns.  This note was electronically signed.    Molli Hazard, MD MD 11/07/2014 1:33 PM

## 2014-11-12 ENCOUNTER — Other Ambulatory Visit (HOSPITAL_COMMUNITY): Payer: Self-pay | Admitting: Oncology

## 2014-11-14 ENCOUNTER — Encounter (HOSPITAL_COMMUNITY): Payer: Self-pay

## 2014-11-14 ENCOUNTER — Other Ambulatory Visit (HOSPITAL_COMMUNITY): Payer: Self-pay | Admitting: Hematology & Oncology

## 2014-11-14 ENCOUNTER — Encounter (HOSPITAL_BASED_OUTPATIENT_CLINIC_OR_DEPARTMENT_OTHER): Payer: Medicare HMO

## 2014-11-14 VITALS — BP 143/77 | HR 51 | Temp 97.5°F | Resp 18 | Wt 326.4 lb

## 2014-11-14 DIAGNOSIS — Z5112 Encounter for antineoplastic immunotherapy: Secondary | ICD-10-CM

## 2014-11-14 DIAGNOSIS — C8580 Other specified types of non-Hodgkin lymphoma, unspecified site: Secondary | ICD-10-CM

## 2014-11-14 DIAGNOSIS — E119 Type 2 diabetes mellitus without complications: Secondary | ICD-10-CM | POA: Diagnosis not present

## 2014-11-14 LAB — COMPREHENSIVE METABOLIC PANEL
ALK PHOS: 72 U/L (ref 39–117)
ALT: 12 U/L (ref 0–53)
ANION GAP: 9 (ref 5–15)
AST: 18 U/L (ref 0–37)
Albumin: 3.8 g/dL (ref 3.5–5.2)
BUN: 31 mg/dL — AB (ref 6–23)
CALCIUM: 9 mg/dL (ref 8.4–10.5)
CO2: 27 mmol/L (ref 19–32)
Chloride: 100 mmol/L (ref 96–112)
Creatinine, Ser: 2.26 mg/dL — ABNORMAL HIGH (ref 0.50–1.35)
GFR calc non Af Amer: 28 mL/min — ABNORMAL LOW (ref >90)
GFR, EST AFRICAN AMERICAN: 33 mL/min — AB (ref >90)
Glucose, Bld: 280 mg/dL — ABNORMAL HIGH (ref 70–99)
Potassium: 3.6 mmol/L (ref 3.5–5.1)
Sodium: 136 mmol/L (ref 135–145)
TOTAL PROTEIN: 7.2 g/dL (ref 6.0–8.3)
Total Bilirubin: 1 mg/dL (ref 0.3–1.2)

## 2014-11-14 LAB — CBC WITH DIFFERENTIAL/PLATELET
BASOS ABS: 0.1 10*3/uL (ref 0.0–0.1)
BASOS PCT: 1 % (ref 0–1)
EOS ABS: 0.3 10*3/uL (ref 0.0–0.7)
EOS PCT: 5 % (ref 0–5)
HEMATOCRIT: 45.7 % (ref 39.0–52.0)
Hemoglobin: 15.4 g/dL (ref 13.0–17.0)
Lymphocytes Relative: 32 % (ref 12–46)
Lymphs Abs: 2.1 10*3/uL (ref 0.7–4.0)
MCH: 29.8 pg (ref 26.0–34.0)
MCHC: 33.7 g/dL (ref 30.0–36.0)
MCV: 88.6 fL (ref 78.0–100.0)
MONO ABS: 0.6 10*3/uL (ref 0.1–1.0)
Monocytes Relative: 9 % (ref 3–12)
Neutro Abs: 3.5 10*3/uL (ref 1.7–7.7)
Neutrophils Relative %: 54 % (ref 43–77)
PLATELETS: 119 10*3/uL — AB (ref 150–400)
RBC: 5.16 MIL/uL (ref 4.22–5.81)
RDW: 14.6 % (ref 11.5–15.5)
WBC: 6.5 10*3/uL (ref 4.0–10.5)

## 2014-11-14 MED ORDER — INSULIN ASPART 100 UNIT/ML ~~LOC~~ SOLN
8.0000 [IU] | Freq: Once | SUBCUTANEOUS | Status: AC
  Administered 2014-11-14: 8 [IU] via SUBCUTANEOUS
  Filled 2014-11-14: qty 0.08

## 2014-11-14 MED ORDER — SODIUM CHLORIDE 0.9 % IV SOLN
375.0000 mg/m2 | Freq: Once | INTRAVENOUS | Status: AC
  Administered 2014-11-14: 1000 mg via INTRAVENOUS
  Filled 2014-11-14: qty 100

## 2014-11-14 MED ORDER — ACETAMINOPHEN 325 MG PO TABS
650.0000 mg | ORAL_TABLET | Freq: Once | ORAL | Status: AC
  Administered 2014-11-14: 650 mg via ORAL

## 2014-11-14 MED ORDER — INSULIN ASPART 100 UNIT/ML ~~LOC~~ SOLN
8.0000 [IU] | Freq: Once | SUBCUTANEOUS | Status: DC
  Filled 2014-11-14: qty 0.08

## 2014-11-14 MED ORDER — SODIUM CHLORIDE 0.9 % IV SOLN
Freq: Once | INTRAVENOUS | Status: AC
  Administered 2014-11-14: 10:00:00 via INTRAVENOUS

## 2014-11-14 MED ORDER — SODIUM CHLORIDE 0.9 % IJ SOLN: 10.0000 mL | INTRAMUSCULAR | Status: DC | PRN

## 2014-11-14 MED ORDER — DIPHENHYDRAMINE HCL 25 MG PO CAPS
ORAL_CAPSULE | ORAL | Status: AC
  Filled 2014-11-14: qty 2

## 2014-11-14 MED ORDER — DIPHENHYDRAMINE HCL 25 MG PO CAPS
50.0000 mg | ORAL_CAPSULE | Freq: Once | ORAL | Status: AC
  Administered 2014-11-14: 50 mg via ORAL

## 2014-11-14 MED ORDER — ACETAMINOPHEN 325 MG PO TABS
ORAL_TABLET | ORAL | Status: AC
  Filled 2014-11-14: qty 2

## 2014-11-14 NOTE — Patient Instructions (Signed)
[facility] Discharge Instructions for Patients Receiving Chemotherapy  Today you received the following chemotherapy agents rituxan We given you insulin today because your blood glucose was elevated Please return next week for week 4. Call the clinic if you have any questions or concerns  To help prevent nausea and vomiting after your treatment, we encourage you to take your nausea medication    If you develop nausea and vomiting that is not controlled by your nausea medication, call the clinic. If it is after clinic hours your family physician or the after hours number for the clinic or go to the Emergency Department.   BELOW ARE SYMPTOMS THAT SHOULD BE REPORTED IMMEDIATELY:  *FEVER GREATER THAN 101.0 F  *CHILLS WITH OR WITHOUT FEVER  NAUSEA AND VOMITING THAT IS NOT CONTROLLED WITH YOUR NAUSEA MEDICATION  *UNUSUAL SHORTNESS OF BREATH  *UNUSUAL BRUISING OR BLEEDING  TENDERNESS IN MOUTH AND THROAT WITH OR WITHOUT PRESENCE OF ULCERS  *URINARY PROBLEMS  *BOWEL PROBLEMS  UNUSUAL RASH Items with * indicate a potential emergency and should be followed up as soon as possible.  One of the nurses will contact you 24 hours after your treatment. Please let the nurse know about any problems that you may have experienced. Feel free to call the clinic you have any questions or concerns. The clinic phone number is [phone].   I have been informed and understand all the instructions given to me. I know to contact the clinic, my physician, or go to the Emergency Department if any problems should occur. I do not have any questions at this time, but understand that I may call the clinic during office hours or the Patient Navigator at [phone] should I have any questions or need assistance in obtaining follow up care.

## 2014-11-14 NOTE — Progress Notes (Signed)
[name] [name] Tolerated chemotherapy well today.  Discharged ambulatory.  1300 notified Dr Whitney Muse of blood glucose level, orders received.

## 2014-11-21 ENCOUNTER — Encounter (HOSPITAL_BASED_OUTPATIENT_CLINIC_OR_DEPARTMENT_OTHER): Payer: Medicare HMO

## 2014-11-21 ENCOUNTER — Encounter (HOSPITAL_BASED_OUTPATIENT_CLINIC_OR_DEPARTMENT_OTHER): Payer: Medicare HMO | Admitting: Oncology

## 2014-11-21 ENCOUNTER — Encounter (HOSPITAL_COMMUNITY): Payer: Self-pay | Admitting: Oncology

## 2014-11-21 VITALS — BP 137/56 | HR 54 | Temp 98.0°F | Resp 20

## 2014-11-21 VITALS — BP 140/72 | HR 62 | Temp 97.7°F | Resp 21 | Wt 327.6 lb

## 2014-11-21 DIAGNOSIS — C8580 Other specified types of non-Hodgkin lymphoma, unspecified site: Secondary | ICD-10-CM | POA: Diagnosis not present

## 2014-11-21 DIAGNOSIS — Z5112 Encounter for antineoplastic immunotherapy: Secondary | ICD-10-CM | POA: Diagnosis not present

## 2014-11-21 LAB — COMPREHENSIVE METABOLIC PANEL
ALK PHOS: 67 U/L (ref 39–117)
ALT: 14 U/L (ref 0–53)
AST: 22 U/L (ref 0–37)
Albumin: 3.8 g/dL (ref 3.5–5.2)
Anion gap: 10 (ref 5–15)
BUN: 32 mg/dL — ABNORMAL HIGH (ref 6–23)
CO2: 26 mmol/L (ref 19–32)
Calcium: 8.9 mg/dL (ref 8.4–10.5)
Chloride: 100 mmol/L (ref 96–112)
Creatinine, Ser: 2.4 mg/dL — ABNORMAL HIGH (ref 0.50–1.35)
GFR, EST AFRICAN AMERICAN: 31 mL/min — AB (ref >90)
GFR, EST NON AFRICAN AMERICAN: 26 mL/min — AB (ref >90)
Glucose, Bld: 288 mg/dL — ABNORMAL HIGH (ref 70–99)
Potassium: 3.4 mmol/L — ABNORMAL LOW (ref 3.5–5.1)
Sodium: 136 mmol/L (ref 135–145)
Total Bilirubin: 0.8 mg/dL (ref 0.3–1.2)
Total Protein: 7 g/dL (ref 6.0–8.3)

## 2014-11-21 LAB — CBC WITH DIFFERENTIAL/PLATELET
Basophils Absolute: 0.1 10*3/uL (ref 0.0–0.1)
Basophils Relative: 1 % (ref 0–1)
EOS PCT: 5 % (ref 0–5)
Eosinophils Absolute: 0.3 10*3/uL (ref 0.0–0.7)
HCT: 44.4 % (ref 39.0–52.0)
Hemoglobin: 15.2 g/dL (ref 13.0–17.0)
LYMPHS ABS: 1.8 10*3/uL (ref 0.7–4.0)
LYMPHS PCT: 28 % (ref 12–46)
MCH: 30.3 pg (ref 26.0–34.0)
MCHC: 34.2 g/dL (ref 30.0–36.0)
MCV: 88.4 fL (ref 78.0–100.0)
Monocytes Absolute: 0.7 10*3/uL (ref 0.1–1.0)
Monocytes Relative: 11 % (ref 3–12)
NEUTROS ABS: 3.6 10*3/uL (ref 1.7–7.7)
Neutrophils Relative %: 55 % (ref 43–77)
PLATELETS: 113 10*3/uL — AB (ref 150–400)
RBC: 5.02 MIL/uL (ref 4.22–5.81)
RDW: 14.5 % (ref 11.5–15.5)
WBC: 6.5 10*3/uL (ref 4.0–10.5)

## 2014-11-21 MED ORDER — RITUXIMAB CHEMO INJECTION 500 MG/50ML
375.0000 mg/m2 | Freq: Once | INTRAVENOUS | Status: AC
  Administered 2014-11-21: 1000 mg via INTRAVENOUS
  Filled 2014-11-21: qty 100

## 2014-11-21 MED ORDER — DIPHENHYDRAMINE HCL 25 MG PO CAPS
ORAL_CAPSULE | ORAL | Status: AC
  Filled 2014-11-21: qty 2

## 2014-11-21 MED ORDER — ACETAMINOPHEN 325 MG PO TABS
650.0000 mg | ORAL_TABLET | Freq: Once | ORAL | Status: AC
  Administered 2014-11-21: 650 mg via ORAL

## 2014-11-21 MED ORDER — SODIUM CHLORIDE 0.9 % IJ SOLN
10.0000 mL | INTRAMUSCULAR | Status: DC | PRN
  Administered 2014-11-21: 10 mL
  Filled 2014-11-21: qty 10

## 2014-11-21 MED ORDER — SODIUM CHLORIDE 0.9 % IV SOLN
Freq: Once | INTRAVENOUS | Status: AC
  Administered 2014-11-21: 09:00:00 via INTRAVENOUS

## 2014-11-21 MED ORDER — DIPHENHYDRAMINE HCL 25 MG PO CAPS
50.0000 mg | ORAL_CAPSULE | Freq: Once | ORAL | Status: AC
  Administered 2014-11-21: 50 mg via ORAL

## 2014-11-21 MED ORDER — ACETAMINOPHEN 325 MG PO TABS
ORAL_TABLET | ORAL | Status: AC
  Filled 2014-11-21: qty 2

## 2014-11-21 NOTE — Assessment & Plan Note (Addendum)
Stage II Marginal Zone Lymphoma.  Actively undergoing treatment with Rituxan weekly and this will be followed by Rituxan maintenance every 60 days.  Today is cycle #4/4 of weekly Rituxan which began on 10/31/2014.    Future antibody plan built for maintenance Rituxan every 60 days x 2 years.  GI follow-up appointment as scheduled in June 2016.  We will order and schedule future imaging in the future on his next follow-up appointment.  Return in 8 weeks for labs, follow-up visit, and Ritxan infusion.  Labs: CBC diff, CMET, LDH, B2M.

## 2014-11-21 NOTE — Patient Instructions (Signed)
[place] [facility] Discharge Instructions  RECOMMENDATIONS MADE BY THE CONSULTANT AND ANY TEST RESULTS WILL BE SENT TO YOUR REFERRING PHYSICIAN.  Exam and discussion by Robynn Pane, PA-C Recommend that you keep your appointment with Gastroenterology as scheduled.  Report unexplained weight loss, fevers, night sweats or other concerns.  Follow-up in 2 months with labs, office visit and Rituxan.  Thank you for choosing [place] [facility] to provide your oncology and hematology care.  To afford each patient quality time with our provider, please arrive at least 15 minutes before your scheduled appointment time.    You need to re-schedule your appointment should you arrive 10 or more minutes late.  We strive to give you quality time with our providers, and arriving late affects you and other patients whose appointments are after yours.  Also, if you no show three or more times for appointments you may be dismissed from the clinic at the providers discretion.     Again, thank you for choosing [facility].  Our hope is that these requests will decrease the amount of time that you wait before being seen by our physicians.       _____________________________________________________________  Should you have questions after your visit to [facility], please contact our office at [phone] between the hours of 8:30 a.m. and 4:30 p.m.  Voicemails left after 4:30 p.m. will not be returned until the following business day.  For prescription refill requests, have your pharmacy contact our office.

## 2014-11-21 NOTE — Progress Notes (Signed)
[name], [name] Schanz, PA-C [address] [place] [address]  Marginal zone lymphoma - Plan: CBC with Differential, Comprehensive metabolic panel, Lactate dehydrogenase, Beta 2 microglobuline, serum  CURRENT THERAPY:  Weekly Rituxan #4 today beginning on 10/31/2014.  INTERVAL HISTORY: [name] [name] returns for followup of Stage II Marginal Zone Lymphoma.  Actively undergoing treatment with Rituxan weekly and this will be followed by Rituxan maintenance every 60 days.    Marginal zone lymphoma   07/23/2014 Imaging CT C/A/P with soft tissue mass in pelvis, LN adjacent to distal esophagus, upper abdomen a partially calcified soft tissue mass   09/27/2014 Initial Biopsy CT guided biopsy   10/17/2014 PET scan Hypermetabolic mass left adjacent to the bladder. The mass is mild to moderate in metabolic activity consistent with low-grade lymphoma. Mild left external iliac and common iliac metabolic adenopathy.Single retroperitoneal node anterior to L renal vein   10/31/2014 - 11/21/2014 Chemotherapy Single Agent Rituxan weekly x 4    I personally reviewed and went over laboratory results with the patient.  The results are noted within this dictation.  He asked about scans, and it is too early for scans.  We will arrange repeat imaging in the future.  He asks about his scheduled GI appointment and the fact that his co-pays are causing a financial hardship.  He has an appt on 01/02/2015 for GI follow-up.  He is encouraged to keep this appointment.  He is tolerating therapy well without any issues today.  He denies any B symptoms and his weight is stable.  Hematologically, he denies any complaints and ROS questioning is negative.   Past Medical History  Diagnosis Date  . Peripheral vascular disease     neuropathy in toes   . Shortness of breath     due ot pain in knees   . Sleep apnea     sleep study 3/29 13 ? location   . Pneumonia     hx of   . Diabetes mellitus   .  Hypothyroidism   . Chronic kidney disease     stage III kidney disease   . Arthritis     knees, back   . S/P biopsy     has S/P right TKA; Abdominal pain; Chronic kidney disease; Morbid obesity due to excess calories; Diabetes; Hypertension; Elevated LFTs; Abnormal CT scan, pelvis; Bladder mass; Elevated liver enzymes; Varices, esophageal; Colonic mass; Pelvic mass in [name]; and Marginal zone lymphoma on his problem list.     has No Known Allergies.  [name] [name] does not currently have medications on file.  Past Surgical History  Procedure Laterality Date  . Cholecystectomy  1992  . Total knee arthroplasty  11/10/2011    Procedure: TOTAL KNEE ARTHROPLASTY;  Surgeon: Mauri Pole, MD;  Location: [facility];  Service: Orthopedics;  Laterality: Right;  . Colonoscopy N/A 09/03/2014    Procedure: COLONOSCOPY;  Surgeon: Danie Binder, MD;  [facility];  Service: Endoscopy;  Laterality: N/A;  12:30 PM  . Esophagogastroduodenoscopy N/A 09/03/2014    Procedure: ESOPHAGOGASTRODUODENOSCOPY (EGD);  Surgeon: Danie Binder, MD;  [facility];  Service: Endoscopy;  Laterality: N/A;    Denies any headaches, dizziness, double vision, fevers, chills, night sweats, nausea, vomiting, diarrhea, constipation, chest pain, heart palpitations, shortness of breath, blood in stool, black tarry stool, urinary pain, urinary burning, urinary frequency, hematuria.   PHYSICAL EXAMINATION  ECOG PERFORMANCE STATUS: 0 - Asymptomatic  Filed Vitals:   11/21/14 0900  BP: 140/72  Pulse: 62  Temp: 97.7 F (36.5 C)  Resp: 21    GENERAL:alert, no distress, well nourished, well developed, comfortable, cooperative, obese and smiling SKIN: skin color, texture, turgor are normal, no rashes or significant lesions HEAD: Normocephalic, No masses, lesions, tenderness or abnormalities EYES: normal, PERRLA, EOMI, Conjunctiva are pink and non-injected EARS: External ears normal OROPHARYNX:lips, buccal mucosa,  and tongue normal and mucous membranes are moist  NECK: supple, no adenopathy, thyroid normal size, non-tender, without nodularity, trachea midline LYMPH:  no palpable lymphadenopathy BREAST:not examined LUNGS: clear to auscultation  HEART: regular rate & rhythm, no murmurs, no gallops, S1 normal and S2 normal ABDOMEN:abdomen soft, non-tender, obese, normal bowel sounds and organ exam hindered due to body habitus. BACK: Back symmetric, no curvature. EXTREMITIES:less then 2 second capillary refill, no joint deformities, effusion, or inflammation, no skin discoloration, no clubbing, no cyanosis  NEURO: alert & oriented x 3 with fluent speech, no focal motor/sensory deficits, gait normal   LABORATORY DATA: CBC    Component Value Date/Time   WBC 6.5 11/21/2014 0900   RBC 5.02 11/21/2014 0900   HGB 15.2 11/21/2014 0900   HCT 44.4 11/21/2014 0900   PLT 113* 11/21/2014 0900   MCV 88.4 11/21/2014 0900   MCH 30.3 11/21/2014 0900   MCHC 34.2 11/21/2014 0900   RDW 14.5 11/21/2014 0900   LYMPHSABS 1.8 11/21/2014 0900   MONOABS 0.7 11/21/2014 0900   EOSABS 0.3 11/21/2014 0900   BASOSABS 0.1 11/21/2014 0900      Chemistry      Component Value Date/Time   NA 136 11/21/2014 0900   K 3.4* 11/21/2014 0900   CL 100 11/21/2014 0900   CO2 26 11/21/2014 0900   BUN 32* 11/21/2014 0900   CREATININE 2.40* 11/21/2014 0900      Component Value Date/Time   CALCIUM 8.9 11/21/2014 0900   ALKPHOS 67 11/21/2014 0900   AST 22 11/21/2014 0900   ALT 14 11/21/2014 0900   BILITOT 0.8 11/21/2014 0900        ASSESSMENT AND PLAN:  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  Actively undergoing treatment with Rituxan weekly and this will be followed by Rituxan maintenance every 60 days.  Today is cycle #4/4 of weekly Rituxan which began on 10/31/2014.    Future antibody plan built for maintenance Rituxan every 60 days x 2 years.  GI follow-up appointment as scheduled in June  2016.  We will order and schedule future imaging in the future on his next follow-up appointment.  Return in 8 weeks for labs, follow-up visit, and Ritxan infusion.  Labs: CBC diff, CMET, LDH, B2M.    THERAPY PLAN:  Complete #4 of weekly Rituxan and then move on to maintenance Rituxan every 60 days.  All questions were answered. The patient knows to call the clinic with any problems, questions or concerns. We can certainly see the patient much sooner if necessary.  Patient and plan discussed with Dr. Ancil Linsey and she is in agreement with the aforementioned.   This note is electronically signed by: Robynn Pane 11/21/2014 1:29 PM

## 2014-11-21 NOTE — Patient Instructions (Signed)
[facility] Discharge Instructions for Patients Receiving Chemotherapy  Today you received the following chemotherapy agents Rituxan week 4.  To help prevent nausea and vomiting after your treatment, we encourage you to take your nausea medication as instructed. If you develop nausea and vomiting that is not controlled by your nausea medication, call the clinic. If it is after clinic hours your family physician or the after hours number for the clinic or go to the Emergency Department. BELOW ARE SYMPTOMS THAT SHOULD BE REPORTED IMMEDIATELY:  *FEVER GREATER THAN 101.0 F  *CHILLS WITH OR WITHOUT FEVER  NAUSEA AND VOMITING THAT IS NOT CONTROLLED WITH YOUR NAUSEA MEDICATION  *UNUSUAL SHORTNESS OF BREATH  *UNUSUAL BRUISING OR BLEEDING  TENDERNESS IN MOUTH AND THROAT WITH OR WITHOUT PRESENCE OF ULCERS  *URINARY PROBLEMS  *BOWEL PROBLEMS  UNUSUAL RASH Items with * indicate a potential emergency and should be followed up as soon as possible.  Return as scheduled.  I have been informed and understand all the instructions given to me. I know to contact the clinic, my physician, or go to the Emergency Department if any problems should occur. I do not have any questions at this time, but understand that I may call the clinic during office hours or the Patient Navigator at [phone] should I have any questions or need assistance in obtaining follow up care.    __________________________________________  _____________  __________ Signature of Patient or Authorized Representative            Date                   Time    __________________________________________ Nurse's Signature

## 2014-11-21 NOTE — Progress Notes (Signed)
Tolerated Rituxan infusion well. 

## 2015-01-02 ENCOUNTER — Ambulatory Visit: Payer: Medicare HMO | Admitting: Nurse Practitioner

## 2015-01-02 ENCOUNTER — Telehealth: Payer: Self-pay | Admitting: Nurse Practitioner

## 2015-01-02 NOTE — Telephone Encounter (Signed)
Pt was a no show

## 2015-01-02 NOTE — Telephone Encounter (Signed)
Noted  

## 2015-01-21 ENCOUNTER — Encounter (HOSPITAL_COMMUNITY): Payer: Medicare HMO | Attending: Hematology & Oncology | Admitting: Hematology & Oncology

## 2015-01-21 ENCOUNTER — Encounter (HOSPITAL_BASED_OUTPATIENT_CLINIC_OR_DEPARTMENT_OTHER): Payer: Medicare HMO

## 2015-01-21 ENCOUNTER — Encounter (HOSPITAL_COMMUNITY): Payer: Self-pay | Admitting: Hematology & Oncology

## 2015-01-21 VITALS — BP 149/68 | HR 48 | Temp 97.6°F | Resp 18

## 2015-01-21 VITALS — BP 157/77 | HR 50 | Temp 98.4°F | Resp 20 | Wt 322.3 lb

## 2015-01-21 DIAGNOSIS — Z5112 Encounter for antineoplastic immunotherapy: Secondary | ICD-10-CM

## 2015-01-21 DIAGNOSIS — C8580 Other specified types of non-Hodgkin lymphoma, unspecified site: Secondary | ICD-10-CM

## 2015-01-21 LAB — CBC WITH DIFFERENTIAL/PLATELET
BASOS PCT: 1 % (ref 0–1)
Basophils Absolute: 0.1 10*3/uL (ref 0.0–0.1)
EOS PCT: 4 % (ref 0–5)
Eosinophils Absolute: 0.3 10*3/uL (ref 0.0–0.7)
HCT: 46.8 % (ref 39.0–52.0)
Hemoglobin: 16.2 g/dL (ref 13.0–17.0)
Lymphocytes Relative: 26 % (ref 12–46)
Lymphs Abs: 2 10*3/uL (ref 0.7–4.0)
MCH: 30.1 pg (ref 26.0–34.0)
MCHC: 34.6 g/dL (ref 30.0–36.0)
MCV: 87 fL (ref 78.0–100.0)
Monocytes Absolute: 0.7 10*3/uL (ref 0.1–1.0)
Monocytes Relative: 9 % (ref 3–12)
NEUTROS PCT: 60 % (ref 43–77)
Neutro Abs: 4.8 10*3/uL (ref 1.7–7.7)
PLATELETS: 128 10*3/uL — AB (ref 150–400)
RBC: 5.38 MIL/uL (ref 4.22–5.81)
RDW: 14.5 % (ref 11.5–15.5)
WBC: 7.9 10*3/uL (ref 4.0–10.5)

## 2015-01-21 LAB — COMPREHENSIVE METABOLIC PANEL
ALT: 14 U/L — ABNORMAL LOW (ref 17–63)
AST: 16 U/L (ref 15–41)
Albumin: 3.9 g/dL (ref 3.5–5.0)
Alkaline Phosphatase: 57 U/L (ref 38–126)
Anion gap: 11 (ref 5–15)
BUN: 35 mg/dL — ABNORMAL HIGH (ref 6–20)
CO2: 25 mmol/L (ref 22–32)
Calcium: 9 mg/dL (ref 8.9–10.3)
Chloride: 100 mmol/L — ABNORMAL LOW (ref 101–111)
Creatinine, Ser: 2.34 mg/dL — ABNORMAL HIGH (ref 0.61–1.24)
GFR calc Af Amer: 31 mL/min — ABNORMAL LOW (ref >60)
GFR calc non Af Amer: 27 mL/min — ABNORMAL LOW (ref >60)
Glucose, Bld: 175 mg/dL — ABNORMAL HIGH (ref 65–99)
Potassium: 3.5 mmol/L (ref 3.5–5.1)
Sodium: 136 mmol/L (ref 135–145)
TOTAL PROTEIN: 7.4 g/dL (ref 6.5–8.1)
Total Bilirubin: 1.2 mg/dL (ref 0.3–1.2)

## 2015-01-21 LAB — LACTATE DEHYDROGENASE: LDH: 135 U/L (ref 98–192)

## 2015-01-21 MED ORDER — DIPHENHYDRAMINE HCL 25 MG PO CAPS
50.0000 mg | ORAL_CAPSULE | Freq: Once | ORAL | Status: AC
  Administered 2015-01-21: 50 mg via ORAL
  Filled 2015-01-21: qty 2

## 2015-01-21 MED ORDER — HEPARIN SOD (PORK) LOCK FLUSH 100 UNIT/ML IV SOLN: 500.0000 [IU] | Freq: Once | INTRAVENOUS | Status: DC | PRN

## 2015-01-21 MED ORDER — SODIUM CHLORIDE 0.9 % IV SOLN
Freq: Once | INTRAVENOUS | Status: AC
  Administered 2015-01-21: 10:00:00 via INTRAVENOUS

## 2015-01-21 MED ORDER — SODIUM CHLORIDE 0.9 % IJ SOLN
10.0000 mL | INTRAMUSCULAR | Status: DC | PRN
  Administered 2015-01-21: 10 mL
  Filled 2015-01-21: qty 10

## 2015-01-21 MED ORDER — SODIUM CHLORIDE 0.9 % IV SOLN
375.0000 mg/m2 | Freq: Once | INTRAVENOUS | Status: AC
  Administered 2015-01-21: 1000 mg via INTRAVENOUS
  Filled 2015-01-21: qty 100

## 2015-01-21 MED ORDER — ACETAMINOPHEN 325 MG PO TABS
650.0000 mg | ORAL_TABLET | Freq: Once | ORAL | Status: AC
  Administered 2015-01-21: 650 mg via ORAL
  Filled 2015-01-21: qty 2

## 2015-01-21 NOTE — Progress Notes (Signed)
1300:  Tolerated infusion w/o adverse reaction.  VSS.  In no distress.  Discharged ambulatory in c/o family for transport home.

## 2015-01-21 NOTE — Progress Notes (Signed)
[name] PROGRESS NOTE  Patient Care Team: Ysidro Evert, PA-C as PCP - General (Physician Assistant)  CHIEF COMPLAINTS/PURPOSE OF CONSULTATION:  Marginal Zone Lymphoma EGD 09/03/2014 negative for intestinal metaplasia, dysplasia or malignancy H pylori negative Colonoscopy on 09/03/2014 with 6 polyps (tubular adenomas), redundant left colon  Marginal zone lymphoma   Staging form: Lymphoid Neoplasms, AJCC 6th Edition     Clinical stage from 11/07/2014: Stage II - Unsigned   HISTORY OF PRESENTING ILLNESS:  [name] [name] 69 y.o. male is here because of Stage II marginal zone lymphoma.  He was admitted to [facility] in December 2015 with abdominal pain. The pain was described as severe. He also reported episodes of vomiting. At presentation he had elevated liver function tests and bilirubin, CT scan of the abdomen and pelvis showed a possible bladder mass abutting the colon.  He underwent a colonoscopy on 09/03/2014 with Dr. Oneida Alar. No mass was noted on exam. 6 polyps were removed all without dysplasia on final pathology, and a redundant left colon was noted. He ultimately underwent a CT-guided biopsy of the pelvic mass on February 25, with final pathology consistent with a low-grade non-Hodgkin's lymphoma, marginal zone type.  He is present today with his family and says that he is doing just fine. He received a Cortisone shot in the knee that he says is now feeling much better. He says that he has no pain, everything is normal, and he has the same energy level as before.     Marginal zone lymphoma   07/23/2014 Imaging CT C/A/P with soft tissue mass in pelvis, LN adjacent to distal esophagus, upper abdomen a partially calcified soft tissue mass   09/27/2014 Initial Biopsy CT guided biopsy   10/17/2014 PET scan Hypermetabolic mass left adjacent to the bladder. The mass is mild to moderate in metabolic activity consistent with low-grade lymphoma. Mild left external iliac and  common iliac metabolic adenopathy.Single retroperitoneal node anterior to L renal vein   10/31/2014 - 11/21/2014 Chemotherapy Single Agent Rituxan weekly x 4     MEDICAL HISTORY:  Past Medical History  Diagnosis Date  . Peripheral vascular disease     neuropathy in toes   . Shortness of breath     due ot pain in knees   . Sleep apnea     sleep study 3/29 13 ? location   . Pneumonia     hx of   . Diabetes mellitus   . Hypothyroidism   . Chronic kidney disease     stage III kidney disease   . Arthritis     knees, back   . S/P biopsy     SURGICAL HISTORY: Past Surgical History  Procedure Laterality Date  . Cholecystectomy  1992  . Total knee arthroplasty  11/10/2011    Procedure: TOTAL KNEE ARTHROPLASTY;  Surgeon: Mauri Pole, MD;  Location: [facility];  Service: Orthopedics;  Laterality: Right;  . Colonoscopy N/A 09/03/2014    SLF:six colon polyps removed/small internal hemorrhoids  . Esophagogastroduodenoscopy N/A 09/03/2014    SLF: mild gastritis/few gastric polyps    SOCIAL HISTORY: History   Social History  . Marital Status: Divorced    Spouse Name: N/A  . Number of Children: N/A  . Years of Education: N/A   Occupational History  . Not on file.   Social History Main Topics  . Smoking status: Former Smoker -- 1.00 packs/day for 30 years    Quit date: 08/04/2007  . Smokeless tobacco:  Never Used  . Alcohol Use: No  . Drug Use: No  . Sexual Activity: Not on file   Other Topics Concern  . Not on file   Social History Narrative    FAMILY HISTORY: Family History  Problem Relation Age of Onset  . Colon cancer Neg Hx    has no family status information on file.   ALLERGIES:  has No Known Allergies.  MEDICATIONS:  Current Outpatient Prescriptions  Medication Sig Dispense Refill  . aspirin 81 MG tablet Take 81 mg by mouth daily.    . Insulin Glargine (LANTUS SOLOSTAR) 100 UNIT/ML Solostar Pen Inject 51 Units into the skin daily. Pt takes in the AM    .  insulin lispro (HUMALOG KWIKPEN) 100 UNIT/ML KiwkPen Inject 3-7 Units into the skin 3 (three) times daily as needed.     Marland Kitchen levothyroxine (SYNTHROID, LEVOTHROID) 300 MCG tablet Take 300 mcg by mouth every morning.    Marland Kitchen losartan (COZAAR) 100 MG tablet Take 100 mg by mouth every morning.    . triamcinolone (NASACORT) 55 MCG/ACT AERO nasal inhaler Place 2 sprays into the nose daily as needed (allergies).    Marland Kitchen UNABLE TO FIND Apply 1 application topically as needed. Allergy cream    . UNABLE TO FIND Apply 1 application topically as needed. Diabetic skin relief foot cream    . verapamil (CALAN-SR) 180 MG CR tablet Take 180 mg by mouth daily.    . GuaiFENesin (MUCINEX PO) Take by mouth. A liquid, did not know correct dose    . ondansetron (ZOFRAN) 8 MG tablet Take 1 tablet (8 mg total) by mouth every 8 (eight) hours as needed for nausea or vomiting. (Patient not taking: Reported on 11/07/2014) 30 tablet 2   No current facility-administered medications for this visit.    Review of Systems  Constitutional: Negative for fever, chills, weight loss and malaise/fatigue.  HENT: Negative for congestion, hearing loss, nosebleeds, sore throat and tinnitus.   Eyes: Negative for blurred vision, double vision, pain and discharge.  Respiratory: Negative for cough, hemoptysis, sputum production, shortness of breath and wheezing.   Cardiovascular: Negative for chest pain, palpitations, claudication, leg swelling and PND.  Gastrointestinal: Negative for heartburn, nausea, vomiting, abdominal pain, diarrhea, constipation, blood in stool and melena.  Genitourinary: Negative for dysuria, urgency, frequency and hematuria.  Musculoskeletal: Negative for myalgias, joint pain and falls.  Skin: Negative for itching and rash.  Neurological: Negative for dizziness, tingling, tremors, sensory change, speech change, focal weakness, seizures, loss of consciousness, weakness and headaches.  Endo/Heme/Allergies: Does not bruise/bleed  easily.  Psychiatric/Behavioral: Negative for depression, suicidal ideas, memory loss and substance abuse. The patient is not nervous/anxious and does not have insomnia.   14 point review of systems was performed and is negative except as detailed under history of present illness and above   PHYSICAL EXAMINATION: ECOG PERFORMANCE STATUS: 0 - Asymptomatic  Filed Vitals:   01/21/15 0828  BP: 157/77  Pulse: 50  Temp: 98.4 F (36.9 C)  Resp: 20   Filed Weights   01/21/15 0828  Weight: 322 lb 4.8 oz (146.194 kg)     Physical Exam  Constitutional: He is oriented to person, place, and time and well-developed, well-nourished, and in no distress.  Obese  HENT:  Head: Normocephalic and atraumatic.  Nose: Nose normal.  Mouth/Throat: Oropharynx is clear and moist. No oropharyngeal exudate.  Eyes: Conjunctivae and EOM are normal. Pupils are equal, round, and reactive to light. Right eye exhibits no discharge.  Left eye exhibits no discharge. No scleral icterus.  Neck: Normal range of motion. Neck supple. No tracheal deviation present. No thyromegaly present.  Cardiovascular: Normal rate, regular rhythm and normal heart sounds.  Exam reveals no gallop and no friction rub.   No murmur heard. Pulmonary/Chest: Effort normal and breath sounds normal. He has no wheezes. He has no rales.  Abdominal: Soft. Bowel sounds are normal. He exhibits no distension and no mass. There is no tenderness. There is no rebound and no guarding.  Musculoskeletal: Normal range of motion. He exhibits no edema.  Chronic LE skin/vascular changes  Lymphadenopathy:    He has no cervical adenopathy.  Neurological: He is alert and oriented to person, place, and time. He has normal reflexes. No cranial nerve deficit. Gait normal. Coordination normal.  Skin: Skin is warm and dry. No rash noted.  Psychiatric: Mood, memory, affect and judgment normal.  Nursing note and vitals reviewed.    LABORATORY DATA:  I have  reviewed the data as listed Lab Results  Component Value Date   WBC 6.5 11/21/2014   HGB 15.2 11/21/2014   HCT 44.4 11/21/2014   MCV 88.4 11/21/2014   PLT 113* 11/21/2014     Chemistry      Component Value Date/Time   NA 136 11/21/2014 0900   K 3.4* 11/21/2014 0900   CL 100 11/21/2014 0900   CO2 26 11/21/2014 0900   BUN 32* 11/21/2014 0900   CREATININE 2.40* 11/21/2014 0900      Component Value Date/Time   CALCIUM 8.9 11/21/2014 0900   ALKPHOS 67 11/21/2014 0900   AST 22 11/21/2014 0900   ALT 14 11/21/2014 0900   BILITOT 0.8 11/21/2014 0900     Soft tissue mass, biopsy, adjacent to urinary bladder - ATYPICAL LYMPHOID PROLIFERATION CONSISTENT WITH NON-HODGKIN'S B-CELL LYMPHOMA. - SEE ONCOLOGY TABLE. Histologic type: Non-Hodgkin's lymphoma, see comment. Grade (if applicable): Low grade. Flow cytometry: No tissue is available for analysis since the specimen was received in formalin. Immunohistochemical stains: BCL-6, CD3, CD5, CD10, CD20, CD21, CD34, CD43, CD79a, CD138, Cyclin D-1, kappa, lambda, TdT, and Ki-67 with appropriate controls. Touch preps/imprints: Not performed. Comments: The sections show needle core biopsy of soft tissue displaying a very dense and relatively monomorphic infiltrate of small lymphoid cells with high nuclear cytoplasmic ratio, round to irregular nuclei, dense chromatin, and small to inconspicuous nucleoli. The appearance is diffuse with lack of obvious follicular structures or proliferation centers. No necrosis or conspicuous mitosis is identified. To further evaluate this process, immunohistochemical stains were performed and show that the overwhelming majority of lymphocytes consist of B-cells, as highlighted with CD20 and CD79a. B-lymphocytes show no significant staining with BCL-6, CD10, CD5, Cyclin D-1, CD34, or TdT. CD21 highlights scattered small foci of dendritic networks throughout the core biopsies. CD138 highlights a minor plasma cell  component, which consist of scattered cells and variably sized but predominantly small clusters. Kappa and lambda stains failed to show any significant staining and are considered non-contributory. Ki-67 shows very low expression (less than 5%). There is an admixed minor T-cell component present as seen with CD3, CD5, and CD43. The overall features are consistent with low grade non-Hodgkin's B-cell lymphoma and the overall phenotypic features favor marginal zone type. Clinical correlation is strongly recommended. (BNS:ds 10/01/14)   RADIOGRAPHIC STUDIES: I have personally reviewed the radiological images as listed and agreed with the findings in the report.   CLINICAL DATA: Upper abdominal pain, possible periesophageal lymph node at incomplete imaging of the  lung bases on CT abdomen pelvis performed earlier today.  EXAM: CT CHEST WITHOUT CONTRAST/CT ABDOMEN     IMPRESSION: Mild subcarinal lymphadenopathy. This is amenable to further evaluation at presumed global staging at PET-CT performed on a nonemergent outpatient basis, pending on results of presumed pending cystoscopy for a previously reported bladder mass. This could be reactive although metastatic lymphadenopathy could appear similar.  Patchy areas of pulmonary parenchymal nodularity with a vague tree-in-bud type appearance most typical for small airways infection. This is also amenable to followup at presumed future restaging studies.   Electronically Signed  By: Conchita Paris M.D.  On: 07/23/2014 19:52  IMPRESSION: Soft tissue mass intimately opposed to the superior and left wall of the urinary bladder. It is uncertain whether this arises from the bladder but felt to be most likely. Periaortic and iliac chain lymph nodes are noted. Further evaluation is recommended.  The abnormality seen on recent plain film in the chest is not well visualized on this exam.  Likely lymph node adjacent to the distal  esophagus. This is incompletely evaluated as it is only seen on the first image. CT of the chest may be helpful for further evaluation.  Although not mentioned in the body of the report, In the upper abdominal cavity just below the xiphoid process, there is a partially calcified soft tissue lesion. This is of uncertain significance. The possibility of a peritoneal lesion cannot be totally excluded.   Electronically Signed  By: Inez Catalina M.D.  On: 07/23/2014 17:36   ASSESSMENT & PLAN:   Stage II marginal zone lymphoma  He has done well so far with single agent Rituxan. His disease is indolent, but based upon the location of the pelvic tumor (ie. Between the bladder and rectum) we opted to proceed with single agent Rituxan. He is currently on Q 2 month Rituxan. We discussed reimaging him in several months. I will see him back again in 2 months with labs and physical exam. We will continue forward with therapy as planned.  All questions were answered. The patient knows to call the clinic with any problems, questions or concerns.   Ordering scans next follow up, in 2 months  This note was electronically signed.   This document serves as a record of services personally performed by Ancil Linsey, MD. It was created on her behalf by Janace Hoard, a trained medical scribe. The creation of this record is based on the scribe's personal observations and the provider's statements to them. This document has been checked and approved by the attending provider.  I have reviewed the above documentation for accuracy and completeness, and I agree with the above.   Kelby Fam. Whitney Muse, MD

## 2015-01-21 NOTE — Patient Instructions (Signed)
[place] at [facility] Discharge Instructions  RECOMMENDATIONS MADE BY THE CONSULTANT AND ANY TEST RESULTS WILL BE SENT TO YOUR REFERRING PHYSICIAN.  Rituxan as scheduled today and lab work. Follow up with the doctor in 2 months with lab work. Please call the clinic if you have any questions or concerns    Thank you for choosing [place] at [facility] to provide your oncology and hematology care.  To afford each patient quality time with our provider, please arrive at least 15 minutes before your scheduled appointment time.    You need to re-schedule your appointment should you arrive 10 or more minutes late.  We strive to give you quality time with our providers, and arriving late affects you and other patients whose appointments are after yours.  Also, if you no show three or more times for appointments you may be dismissed from the clinic at the providers discretion.     Again, thank you for choosing [facility].  Our hope is that these requests will decrease the amount of time that you wait before being seen by our physicians.       _____________________________________________________________  Should you have questions after your visit to [facility], please contact our office at [phone] between the hours of 8:30 a.m. and 4:30 p.m.  Voicemails left after 4:30 p.m. will not be returned until the following business day.  For prescription refill requests, have your pharmacy contact our office.

## 2015-01-22 LAB — BETA 2 MICROGLOBULIN, SERUM: Beta-2 Microglobulin: 7.4 mg/L — ABNORMAL HIGH (ref 0.6–2.4)

## 2015-03-21 ENCOUNTER — Other Ambulatory Visit (HOSPITAL_COMMUNITY): Payer: Self-pay

## 2015-03-21 ENCOUNTER — Other Ambulatory Visit (HOSPITAL_COMMUNITY): Payer: Self-pay | Admitting: Oncology

## 2015-03-25 ENCOUNTER — Encounter (HOSPITAL_BASED_OUTPATIENT_CLINIC_OR_DEPARTMENT_OTHER): Payer: Medicare HMO

## 2015-03-25 ENCOUNTER — Encounter (HOSPITAL_COMMUNITY): Payer: Self-pay | Admitting: Hematology & Oncology

## 2015-03-25 ENCOUNTER — Encounter (HOSPITAL_COMMUNITY): Payer: Medicare HMO | Attending: Hematology & Oncology | Admitting: Hematology & Oncology

## 2015-03-25 VITALS — BP 161/70 | HR 54 | Temp 97.6°F | Resp 20

## 2015-03-25 VITALS — BP 156/77 | HR 63 | Temp 98.4°F | Resp 18 | Wt 326.2 lb

## 2015-03-25 DIAGNOSIS — Z5112 Encounter for antineoplastic immunotherapy: Secondary | ICD-10-CM

## 2015-03-25 DIAGNOSIS — C8580 Other specified types of non-Hodgkin lymphoma, unspecified site: Secondary | ICD-10-CM | POA: Diagnosis not present

## 2015-03-25 DIAGNOSIS — D696 Thrombocytopenia, unspecified: Secondary | ICD-10-CM | POA: Diagnosis not present

## 2015-03-25 DIAGNOSIS — C8589 Other specified types of non-Hodgkin lymphoma, extranodal and solid organ sites: Secondary | ICD-10-CM

## 2015-03-25 DIAGNOSIS — N189 Chronic kidney disease, unspecified: Secondary | ICD-10-CM

## 2015-03-25 LAB — COMPREHENSIVE METABOLIC PANEL
ALT: 19 U/L (ref 17–63)
ANION GAP: 8 (ref 5–15)
AST: 18 U/L (ref 15–41)
Albumin: 4.1 g/dL (ref 3.5–5.0)
Alkaline Phosphatase: 65 U/L (ref 38–126)
BUN: 32 mg/dL — ABNORMAL HIGH (ref 6–20)
CHLORIDE: 100 mmol/L — AB (ref 101–111)
CO2: 28 mmol/L (ref 22–32)
CREATININE: 2.28 mg/dL — AB (ref 0.61–1.24)
Calcium: 8.9 mg/dL (ref 8.9–10.3)
GFR, EST AFRICAN AMERICAN: 32 mL/min — AB (ref >60)
GFR, EST NON AFRICAN AMERICAN: 28 mL/min — AB (ref >60)
Glucose, Bld: 251 mg/dL — ABNORMAL HIGH (ref 65–99)
Potassium: 3.7 mmol/L (ref 3.5–5.1)
SODIUM: 136 mmol/L (ref 135–145)
Total Bilirubin: 1 mg/dL (ref 0.3–1.2)
Total Protein: 7.6 g/dL (ref 6.5–8.1)

## 2015-03-25 LAB — CBC WITH DIFFERENTIAL/PLATELET
BASOS ABS: 0.1 10*3/uL (ref 0.0–0.1)
BASOS PCT: 1 % (ref 0–1)
Eosinophils Absolute: 0.3 10*3/uL (ref 0.0–0.7)
Eosinophils Relative: 5 % (ref 0–5)
HCT: 48.6 % (ref 39.0–52.0)
Hemoglobin: 16.9 g/dL (ref 13.0–17.0)
Lymphocytes Relative: 29 % (ref 12–46)
Lymphs Abs: 2.2 10*3/uL (ref 0.7–4.0)
MCH: 30.8 pg (ref 26.0–34.0)
MCHC: 34.8 g/dL (ref 30.0–36.0)
MCV: 88.7 fL (ref 78.0–100.0)
MONO ABS: 0.8 10*3/uL (ref 0.1–1.0)
Monocytes Relative: 10 % (ref 3–12)
NEUTROS PCT: 56 % (ref 43–77)
Neutro Abs: 4.2 10*3/uL (ref 1.7–7.7)
Platelets: 119 10*3/uL — ABNORMAL LOW (ref 150–400)
RBC: 5.48 MIL/uL (ref 4.22–5.81)
RDW: 15.2 % (ref 11.5–15.5)
WBC: 7.6 10*3/uL (ref 4.0–10.5)

## 2015-03-25 LAB — LACTATE DEHYDROGENASE: LDH: 153 U/L (ref 98–192)

## 2015-03-25 MED ORDER — SODIUM CHLORIDE 0.9 % IV SOLN
Freq: Once | INTRAVENOUS | Status: AC
  Administered 2015-03-25: 11:00:00 via INTRAVENOUS

## 2015-03-25 MED ORDER — SODIUM CHLORIDE 0.9 % IJ SOLN: 10.0000 mL | INTRAMUSCULAR | Status: DC | PRN

## 2015-03-25 MED ORDER — ACETAMINOPHEN 325 MG PO TABS
650.0000 mg | ORAL_TABLET | Freq: Once | ORAL | Status: AC
  Administered 2015-03-25: 650 mg via ORAL
  Filled 2015-03-25: qty 2

## 2015-03-25 MED ORDER — RITUXIMAB CHEMO INJECTION 500 MG/50ML
375.0000 mg/m2 | Freq: Once | INTRAVENOUS | Status: AC
  Administered 2015-03-25: 1000 mg via INTRAVENOUS
  Filled 2015-03-25: qty 100

## 2015-03-25 MED ORDER — DIPHENHYDRAMINE HCL 25 MG PO CAPS
50.0000 mg | ORAL_CAPSULE | Freq: Once | ORAL | Status: AC
  Administered 2015-03-25: 50 mg via ORAL
  Filled 2015-03-25: qty 2

## 2015-03-25 NOTE — Progress Notes (Signed)
[place] PROGRESS NOTE  Patient Care Team: Ysidro Evert, PA-C as PCP - General (Physician Assistant)  CHIEF COMPLAINTS/PURPOSE OF CONSULTATION:  Marginal Zone Lymphoma EGD 09/03/2014 negative for intestinal metaplasia, dysplasia or malignancy H pylori negative Colonoscopy on 09/03/2014 with 6 polyps (tubular adenomas), redundant left colon  Marginal zone lymphoma   Staging form: Lymphoid Neoplasms, AJCC 6th Edition     Clinical stage from 11/07/2014: Stage II - Unsigned   HISTORY OF PRESENTING ILLNESS:  [name] [name] 69 y.o. male is here because of Stage II marginal zone lymphoma.  He was admitted to [facility] in December 2015 with abdominal pain. The pain was described as severe. He also reported episodes of vomiting. At presentation he had elevated liver function tests and bilirubin, CT scan of the abdomen and pelvis showed a possible bladder mass abutting the colon.  He underwent a colonoscopy on 09/03/2014 with Dr. Oneida Alar. No mass was noted on exam. 6 polyps were removed all without dysplasia on final pathology, and a redundant left colon was noted. He ultimately underwent a CT-guided biopsy of the pelvic mass on February 25, with final pathology consistent with a low-grade non-Hodgkin's lymphoma, marginal zone type.  The patient is here today with his lady friend, Butch Penny. He has been eating and sleeping well. He stays as active as his knees allow him to. He recently had one of his front teeth removed.  He denies chest pain, or pain anywhere except for his feet.  He notes using lotion on his legs daily.   He plans to have his flu shot in November. He is without any other complaints or concerns. He is here for ongoing Rituxan therapy.    Marginal zone lymphoma   07/23/2014 Imaging CT C/A/P with soft tissue mass in pelvis, LN adjacent to distal esophagus, upper abdomen a partially calcified soft tissue mass   09/27/2014 Initial Biopsy CT guided biopsy   10/17/2014  PET scan Hypermetabolic mass left adjacent to the bladder. The mass is mild to moderate in metabolic activity consistent with low-grade lymphoma. Mild left external iliac and common iliac metabolic adenopathy.Single retroperitoneal node anterior to L renal vein   10/31/2014 - 11/21/2014 Chemotherapy Single Agent Rituxan weekly x 4     MEDICAL HISTORY:  Past Medical History  Diagnosis Date  . Peripheral vascular disease     neuropathy in toes   . Shortness of breath     due ot pain in knees   . Sleep apnea     sleep study 3/29 13 ? location   . Pneumonia     hx of   . Diabetes mellitus   . Hypothyroidism   . Chronic kidney disease     stage III kidney disease   . Arthritis     knees, back   . S/P biopsy     SURGICAL HISTORY: Past Surgical History  Procedure Laterality Date  . Cholecystectomy  1992  . Total knee arthroplasty  11/10/2011    Procedure: TOTAL KNEE ARTHROPLASTY;  Surgeon: Mauri Pole, MD;  Location: [facility];  Service: Orthopedics;  Laterality: Right;  . Colonoscopy N/A 09/03/2014    SLF:six colon polyps removed/small internal hemorrhoids  . Esophagogastroduodenoscopy N/A 09/03/2014    SLF: mild gastritis/few gastric polyps    SOCIAL HISTORY: Social History   Social History  . Marital Status: Divorced    Spouse Name: N/A  . Number of Children: N/A  . Years of Education: N/A   Occupational History  .  Not on file.   Social History Main Topics  . Smoking status: Former Smoker -- 1.00 packs/day for 30 years    Quit date: 08/04/2007  . Smokeless tobacco: Never Used  . Alcohol Use: No  . Drug Use: No  . Sexual Activity: Not on file   Other Topics Concern  . Not on file   Social History Narrative    FAMILY HISTORY: Family History  Problem Relation Age of Onset  . Colon cancer Neg Hx    has no family status information on file.   ALLERGIES:  has No Known Allergies.  MEDICATIONS:  Current Outpatient Prescriptions  Medication Sig Dispense Refill  .  aspirin 81 MG tablet Take 81 mg by mouth daily.    . Insulin Glargine (LANTUS SOLOSTAR) 100 UNIT/ML Solostar Pen Inject 60 Units into the skin daily. Pt takes in the AM    . insulin lispro (HUMALOG KWIKPEN) 100 UNIT/ML KiwkPen Inject 3-7 Units into the skin 3 (three) times daily as needed.     Marland Kitchen levothyroxine (SYNTHROID, LEVOTHROID) 300 MCG tablet Take 300 mcg by mouth every morning.    Marland Kitchen losartan (COZAAR) 100 MG tablet Take 100 mg by mouth every morning.    . triamcinolone (NASACORT) 55 MCG/ACT AERO nasal inhaler Place 2 sprays into the nose daily as needed (allergies).    Marland Kitchen UNABLE TO FIND Apply 1 application topically as needed. Allergy cream    . UNABLE TO FIND Apply 1 application topically as needed. Diabetic skin relief foot cream    . verapamil (CALAN-SR) 180 MG CR tablet Take 180 mg by mouth daily.    . GuaiFENesin (MUCINEX PO) Take by mouth. A liquid, did not know correct dose    . ondansetron (ZOFRAN) 8 MG tablet Take 1 tablet (8 mg total) by mouth every 8 (eight) hours as needed for nausea or vomiting. (Patient not taking: Reported on 11/07/2014) 30 tablet 2   No current facility-administered medications for this visit.   Facility-Administered Medications Ordered in Other Visits  Medication Dose Route Frequency Provider Last Rate Last Dose  . sodium chloride 0.9 % injection 10 mL  10 mL Intracatheter PRN Patrici Ranks, MD        Review of Systems  Constitutional: Negative for fever, chills, weight loss and malaise/fatigue.  HENT: Negative for congestion, hearing loss, nosebleeds, sore throat and tinnitus.   Eyes: Negative for blurred vision, double vision, pain and discharge.  Respiratory: Negative for cough, hemoptysis, sputum production, shortness of breath and wheezing.   Cardiovascular: Negative for chest pain, palpitations, claudication, leg swelling and PND.  Gastrointestinal: Negative for heartburn, nausea, vomiting, abdominal pain, diarrhea, constipation, blood in stool  and melena.  Genitourinary: Negative for dysuria, urgency, frequency and hematuria.  Musculoskeletal: Negative for myalgias, joint pain and falls.  Skin: Negative for itching and rash.  Neurological: Negative for dizziness, tingling, tremors, sensory change, speech change, focal weakness, seizures, loss of consciousness, weakness and headaches.  Endo/Heme/Allergies: Does not bruise/bleed easily.  Psychiatric/Behavioral: Negative for depression, suicidal ideas, memory loss and substance abuse. The patient is not nervous/anxious and does not have insomnia.   14 point review of systems was performed and is negative except as detailed under history of present illness and above   PHYSICAL EXAMINATION: ECOG PERFORMANCE STATUS: 0 - Asymptomatic  Filed Vitals:   03/25/15 0949  BP: 156/77  Pulse: 63  Temp: 98.4 F (36.9 C)  Resp: 18   Filed Weights   03/25/15 0949  Weight: 326  lb 3.2 oz (147.963 kg)   Physical Exam  Constitutional: He is oriented to person, place, and time and well-developed, well-nourished, and in no distress.  Obese  HENT:  Head: Normocephalic and atraumatic.  Nose: Nose normal.  Mouth/Throat: Oropharynx is clear and moist. No oropharyngeal exudate.  Eyes: Conjunctivae and EOM are normal. Pupils are equal, round, and reactive to light. Right eye exhibits no discharge. Left eye exhibits no discharge. No scleral icterus.  Neck: Normal range of motion. Neck supple. No tracheal deviation present. No thyromegaly present.  Cardiovascular: Normal rate, regular rhythm and normal heart sounds.  Exam reveals no gallop and no friction rub.  No murmur heard. Pulmonary/Chest: Effort normal and breath sounds normal. He has no wheezes. He has no rales.  Abdominal: Soft. Bowel sounds are normal. He exhibits no distension and no mass. There is no tenderness. There is no rebound and no guarding.  Musculoskeletal: Normal range of motion. He exhibits no edema.  Chronic LE skin/vascular  changes, improvement noted.  Lymphadenopathy:    He has no cervical adenopathy.  Neurological: He is alert and oriented to person, place, and time. He has normal reflexes. No cranial nerve deficit. Gait normal. Coordination normal.  Skin: Skin is warm and dry. No rash noted.  Psychiatric: Mood, memory, affect and judgment normal.  Nursing note and vitals reviewed.   LABORATORY DATA:  I have reviewed the data as listed Lab Results  Component Value Date   WBC 7.6 03/25/2015   HGB 16.9 03/25/2015   HCT 48.6 03/25/2015   MCV 88.7 03/25/2015   PLT 119* 03/25/2015     Chemistry      Component Value Date/Time   NA 136 03/25/2015 0925   K 3.7 03/25/2015 0925   CL 100* 03/25/2015 0925   CO2 28 03/25/2015 0925   BUN 32* 03/25/2015 0925   CREATININE 2.28* 03/25/2015 0925      Component Value Date/Time   CALCIUM 8.9 03/25/2015 0925   ALKPHOS 65 03/25/2015 0925   AST 18 03/25/2015 0925   ALT 19 03/25/2015 0925   BILITOT 1.0 03/25/2015 0925     Soft tissue mass, biopsy, adjacent to urinary bladder - ATYPICAL LYMPHOID PROLIFERATION CONSISTENT WITH NON-HODGKIN'S B-CELL LYMPHOMA. - SEE ONCOLOGY TABLE. Histologic type: Non-Hodgkin's lymphoma, see comment. Grade (if applicable): Low grade. Flow cytometry: No tissue is available for analysis since the specimen was received in formalin. Immunohistochemical stains: BCL-6, CD3, CD5, CD10, CD20, CD21, CD34, CD43, CD79a, CD138, Cyclin D-1, kappa, lambda, TdT, and Ki-67 with appropriate controls. Touch preps/imprints: Not performed. Comments: The sections show needle core biopsy of soft tissue displaying a very dense and relatively monomorphic infiltrate of small lymphoid cells with high nuclear cytoplasmic ratio, round to irregular nuclei, dense chromatin, and small to inconspicuous nucleoli. The appearance is diffuse with lack of obvious follicular structures or proliferation centers. No necrosis or conspicuous mitosis is identified. To  further evaluate this process, immunohistochemical stains were performed and show that the overwhelming majority of lymphocytes consist of B-cells, as highlighted with CD20 and CD79a. B-lymphocytes show no significant staining with BCL-6, CD10, CD5, Cyclin D-1, CD34, or TdT. CD21 highlights scattered small foci of dendritic networks throughout the core biopsies. CD138 highlights a minor plasma cell component, which consist of scattered cells and variably sized but predominantly small clusters. Kappa and lambda stains failed to show any significant staining and are considered non-contributory. Ki-67 shows very low expression (less than 5%). There is an admixed minor T-cell component present as seen  with CD3, CD5, and CD43. The overall features are consistent with low grade non-Hodgkin's B-cell lymphoma and the overall phenotypic features favor marginal zone type. Clinical correlation is strongly recommended. (BNS:ds 10/01/14)   RADIOGRAPHIC STUDIES: I have personally reviewed the radiological images as listed and agreed with the findings in the report.   CLINICAL DATA: Upper abdominal pain, possible periesophageal lymph node at incomplete imaging of the lung bases on CT abdomen pelvis performed earlier today.  EXAM: CT CHEST WITHOUT CONTRAST/CT ABDOMEN     IMPRESSION: Mild subcarinal lymphadenopathy. This is amenable to further evaluation at presumed global staging at PET-CT performed on a nonemergent outpatient basis, pending on results of presumed pending cystoscopy for a previously reported bladder mass. This could be reactive although metastatic lymphadenopathy could appear similar.  Patchy areas of pulmonary parenchymal nodularity with a vague tree-in-bud type appearance most typical for small airways infection. This is also amenable to followup at presumed future restaging studies.   Electronically Signed  By: Conchita Paris M.D.  On: 07/23/2014  19:52  IMPRESSION: Soft tissue mass intimately opposed to the superior and left wall of the urinary bladder. It is uncertain whether this arises from the bladder but felt to be most likely. Periaortic and iliac chain lymph nodes are noted. Further evaluation is recommended.  The abnormality seen on recent plain film in the chest is not well visualized on this exam.  Likely lymph node adjacent to the distal esophagus. This is incompletely evaluated as it is only seen on the first image. CT of the chest may be helpful for further evaluation.  Although not mentioned in the body of the report, In the upper abdominal cavity just below the xiphoid process, there is a partially calcified soft tissue lesion. This is of uncertain significance. The possibility of a peritoneal lesion cannot be totally excluded.   Electronically Signed  By: Inez Catalina M.D.  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 His disease is indolent, but based upon the location of the pelvic tumor (ie. Between the bladder and rectum) we opted to proceed with single agent Rituxan. He is currently on Q 2 month Rituxan. We discussed reimaging him and have ordered imaging prior to his next 2 month visit. I will see him back again in 2 months with labs and physical exam. We will continue forward with therapy as planned.  Mr. Briseno does not need any refills at this time.  All questions were answered. The patient knows to call the clinic with any problems, questions or concerns.   This note was electronically signed.   This document serves as a record of services personally performed by Ancil Linsey, MD. It was created on her behalf by Arlyce Harman, a trained medical scribe. The creation of this record is based on the scribe's personal observations and the provider's statements to them. This document  has been checked and approved by the attending provider.  I have reviewed the above documentation for accuracy and completeness, and I agree with the above.   Kelby Fam. Whitney Muse, MD

## 2015-03-25 NOTE — Patient Instructions (Signed)
[facility] Discharge Instructions for Patients Receiving Chemotherapy  Today you received the following chemotherapy agents rituxan Follow up as scheduled Please call the clinic if you have any questions or concerns  To help prevent nausea and vomiting after your treatment, we encourage you to take your nausea medication  If you develop nausea and vomiting, or diarrhea that is not controlled by your medication, call the clinic.  The clinic phone number is [phone]. Office hours are Monday-Friday 8:30am-5:00pm.  BELOW ARE SYMPTOMS THAT SHOULD BE REPORTED IMMEDIATELY:  *FEVER GREATER THAN 101.0 F  *CHILLS WITH OR WITHOUT FEVER  NAUSEA AND VOMITING THAT IS NOT CONTROLLED WITH YOUR NAUSEA MEDICATION  *UNUSUAL SHORTNESS OF BREATH  *UNUSUAL BRUISING OR BLEEDING  TENDERNESS IN MOUTH AND THROAT WITH OR WITHOUT PRESENCE OF ULCERS  *URINARY PROBLEMS  *BOWEL PROBLEMS  UNUSUAL RASH Items with * indicate a potential emergency and should be followed up as soon as possible. If you have an emergency after office hours please contact your primary care physician or go to the nearest emergency department.  Please call the clinic during office hours if you have any questions or concerns.   You may also contact the Patient Navigator at [phone] should you have any questions or need assistance in obtaining follow up care. _____________________________________________________________________ Have you asked about our STAR program?    STAR stands for Survivorship Training and Rehabilitation, and this is a nationally recognized cancer care program that focuses on survivorship and rehabilitation.  Cancer and cancer treatments may cause problems, such as, pain, making you feel tired and keeping you from doing the things that you need or want to do. Cancer rehabilitation can help. Our goal is to reduce these troubling effects and help you have the best quality of life  possible.  You may receive a survey from a nurse that asks questions about your current state of health.  Based on the survey results, all eligible patients will be referred to the [facility] program for an evaluation so we can better serve you! A frequently asked questions sheet is available upon request.

## 2015-03-25 NOTE — Patient Instructions (Signed)
..  [place] at [facility] Discharge Instructions  RECOMMENDATIONS MADE BY THE CONSULTANT AND ANY TEST RESULTS WILL BE SENT TO YOUR REFERRING PHYSICIAN. EXam per Dr. Whitney Muse  Prior to next f/u in 2 months you will have a PET scan at [place] you for choosing [place] at [facility] to provide your oncology and hematology care.  To afford each patient quality time with our provider, please arrive at least 15 minutes before your scheduled appointment time.    You need to re-schedule your appointment should you arrive 10 or more minutes late.  We strive to give you quality time with our providers, and arriving late affects you and other patients whose appointments are after yours.  Also, if you no show three or more times for appointments you may be dismissed from the clinic at the providers discretion.     Again, thank you for choosing [facility].  Our hope is that these requests will decrease the amount of time that you wait before being seen by our physicians.       _____________________________________________________________  Should you have questions after your visit to [facility], please contact our office at [phone] between the hours of 8:30 a.m. and 4:30 p.m.  Voicemails left after 4:30 p.m. will not be returned until the following business day.  For prescription refill requests, have your pharmacy contact our office.

## 2015-03-25 NOTE — Progress Notes (Signed)
LABS DRAWN

## 2015-05-13 ENCOUNTER — Ambulatory Visit (HOSPITAL_COMMUNITY): Payer: Medicare HMO

## 2015-05-14 ENCOUNTER — Encounter (HOSPITAL_COMMUNITY): Payer: Self-pay | Admitting: Emergency Medicine

## 2015-05-14 ENCOUNTER — Observation Stay (HOSPITAL_COMMUNITY)
Admission: EM | Admit: 2015-05-14 | Discharge: 2015-05-16 | Disposition: A | Discharge status: Home / Self Care | Payer: Medicare HMO | Attending: Family Medicine | Admitting: Family Medicine

## 2015-05-14 ENCOUNTER — Other Ambulatory Visit (HOSPITAL_COMMUNITY): Payer: Medicare HMO

## 2015-05-14 DIAGNOSIS — Z87891 Personal history of nicotine dependence: Secondary | ICD-10-CM | POA: Insufficient documentation

## 2015-05-14 DIAGNOSIS — Z8701 Personal history of pneumonia (recurrent): Secondary | ICD-10-CM | POA: Diagnosis not present

## 2015-05-14 DIAGNOSIS — G4730 Sleep apnea, unspecified: Secondary | ICD-10-CM | POA: Insufficient documentation

## 2015-05-14 DIAGNOSIS — Z794 Long term (current) use of insulin: Secondary | ICD-10-CM | POA: Insufficient documentation

## 2015-05-14 DIAGNOSIS — E860 Dehydration: Secondary | ICD-10-CM | POA: Diagnosis not present

## 2015-05-14 DIAGNOSIS — C8580 Other specified types of non-Hodgkin lymphoma, unspecified site: Secondary | ICD-10-CM | POA: Diagnosis present

## 2015-05-14 DIAGNOSIS — N189 Chronic kidney disease, unspecified: Secondary | ICD-10-CM | POA: Diagnosis not present

## 2015-05-14 DIAGNOSIS — E876 Hypokalemia: Secondary | ICD-10-CM | POA: Diagnosis not present

## 2015-05-14 DIAGNOSIS — E039 Hypothyroidism, unspecified: Secondary | ICD-10-CM | POA: Insufficient documentation

## 2015-05-14 DIAGNOSIS — N179 Acute kidney failure, unspecified: Principal | ICD-10-CM | POA: Insufficient documentation

## 2015-05-14 DIAGNOSIS — E119 Type 2 diabetes mellitus without complications: Secondary | ICD-10-CM | POA: Insufficient documentation

## 2015-05-14 DIAGNOSIS — I739 Peripheral vascular disease, unspecified: Secondary | ICD-10-CM | POA: Insufficient documentation

## 2015-05-14 DIAGNOSIS — R197 Diarrhea, unspecified: Secondary | ICD-10-CM | POA: Diagnosis present

## 2015-05-14 DIAGNOSIS — R5383 Other fatigue: Secondary | ICD-10-CM | POA: Insufficient documentation

## 2015-05-14 DIAGNOSIS — Z7982 Long term (current) use of aspirin: Secondary | ICD-10-CM | POA: Insufficient documentation

## 2015-05-14 LAB — COMPREHENSIVE METABOLIC PANEL
ALK PHOS: 72 U/L (ref 38–126)
ALT: 16 U/L — AB (ref 17–63)
AST: 17 U/L (ref 15–41)
Albumin: 3.9 g/dL (ref 3.5–5.0)
Anion gap: 8 (ref 5–15)
BUN: 57 mg/dL — AB (ref 6–20)
CALCIUM: 8.4 mg/dL — AB (ref 8.9–10.3)
CHLORIDE: 105 mmol/L (ref 101–111)
CO2: 22 mmol/L (ref 22–32)
CREATININE: 3.13 mg/dL — AB (ref 0.61–1.24)
GFR calc Af Amer: 22 mL/min — ABNORMAL LOW (ref >60)
GFR calc non Af Amer: 19 mL/min — ABNORMAL LOW (ref >60)
Glucose, Bld: 149 mg/dL — ABNORMAL HIGH (ref 65–99)
Potassium: 3.1 mmol/L — ABNORMAL LOW (ref 3.5–5.1)
SODIUM: 135 mmol/L (ref 135–145)
Total Bilirubin: 1 mg/dL (ref 0.3–1.2)
Total Protein: 7.3 g/dL (ref 6.5–8.1)

## 2015-05-14 LAB — GLUCOSE, CAPILLARY: Glucose-Capillary: 124 mg/dL — ABNORMAL HIGH (ref 65–99)

## 2015-05-14 LAB — CBC WITH DIFFERENTIAL/PLATELET
Basophils Absolute: 0.1 10*3/uL (ref 0.0–0.1)
Basophils Relative: 1 %
EOS ABS: 0.2 10*3/uL (ref 0.0–0.7)
EOS PCT: 3 %
HCT: 45.1 % (ref 39.0–52.0)
HEMOGLOBIN: 16 g/dL (ref 13.0–17.0)
LYMPHS ABS: 1.5 10*3/uL (ref 0.7–4.0)
Lymphocytes Relative: 20 %
MCH: 30.8 pg (ref 26.0–34.0)
MCHC: 35.5 g/dL (ref 30.0–36.0)
MCV: 86.9 fL (ref 78.0–100.0)
MONO ABS: 0.7 10*3/uL (ref 0.1–1.0)
MONOS PCT: 9 %
Neutro Abs: 5.3 10*3/uL (ref 1.7–7.7)
Neutrophils Relative %: 67 %
PLATELETS: 160 10*3/uL (ref 150–400)
RBC: 5.19 MIL/uL (ref 4.22–5.81)
RDW: 15.1 % (ref 11.5–15.5)
WBC: 7.8 10*3/uL (ref 4.0–10.5)

## 2015-05-14 MED ORDER — ALUM & MAG HYDROXIDE-SIMETH 200-200-20 MG/5ML PO SUSP
30.0000 mL | Freq: Four times a day (QID) | ORAL | Status: DC | PRN
Start: 2015-05-14 — End: 2015-05-16

## 2015-05-14 MED ORDER — INSULIN ASPART 100 UNIT/ML ~~LOC~~ SOLN: 0.0000 [IU] | Freq: Every day | SUBCUTANEOUS | Status: DC

## 2015-05-14 MED ORDER — INSULIN ASPART 100 UNIT/ML ~~LOC~~ SOLN: 0.0000 [IU] | Freq: Three times a day (TID) | SUBCUTANEOUS | Status: DC

## 2015-05-14 MED ORDER — TRIAMCINOLONE ACETONIDE 55 MCG/ACT NA AERO
2.0000 | INHALATION_SPRAY | Freq: Every day | NASAL | Status: DC | PRN
  Filled 2015-05-14: qty 21.6

## 2015-05-14 MED ORDER — ONDANSETRON HCL 4 MG/2ML IJ SOLN: 4.0000 mg | Freq: Four times a day (QID) | INTRAMUSCULAR | Status: DC | PRN

## 2015-05-14 MED ORDER — ACETAMINOPHEN 325 MG PO TABS: 650.0000 mg | ORAL_TABLET | Freq: Four times a day (QID) | ORAL | Status: DC | PRN

## 2015-05-14 MED ORDER — ENOXAPARIN SODIUM 30 MG/0.3ML ~~LOC~~ SOLN
30.0000 mg | SUBCUTANEOUS | Status: DC
  Administered 2015-05-14: 30 mg via SUBCUTANEOUS
  Filled 2015-05-14: qty 0.3

## 2015-05-14 MED ORDER — INSULIN GLARGINE 100 UNIT/ML ~~LOC~~ SOLN
52.0000 [IU] | Freq: Every day | SUBCUTANEOUS | Status: DC
  Administered 2015-05-15 – 2015-05-16 (×2): 52 [IU] via SUBCUTANEOUS
  Filled 2015-05-14 (×3): qty 0.52

## 2015-05-14 MED ORDER — INSULIN GLARGINE 100 UNIT/ML SOLOSTAR PEN: 52.0000 [IU] | PEN_INJECTOR | Freq: Every morning | SUBCUTANEOUS | Status: DC

## 2015-05-14 MED ORDER — SODIUM CHLORIDE 0.9 % IV BOLUS (SEPSIS)
1000.0000 mL | Freq: Once | INTRAVENOUS | Status: AC
  Administered 2015-05-14: 1000 mL via INTRAVENOUS

## 2015-05-14 MED ORDER — ONDANSETRON HCL 4 MG PO TABS: 4.0000 mg | ORAL_TABLET | Freq: Four times a day (QID) | ORAL | Status: DC | PRN

## 2015-05-14 MED ORDER — LOSARTAN POTASSIUM 50 MG PO TABS
100.0000 mg | ORAL_TABLET | Freq: Every day | ORAL | Status: DC
  Administered 2015-05-15 – 2015-05-16 (×2): 100 mg via ORAL
  Filled 2015-05-14 (×2): qty 2

## 2015-05-14 MED ORDER — ACETAMINOPHEN 650 MG RE SUPP: 650.0000 mg | Freq: Four times a day (QID) | RECTAL | Status: DC | PRN

## 2015-05-14 MED ORDER — VERAPAMIL HCL ER 180 MG PO TBCR
180.0000 mg | EXTENDED_RELEASE_TABLET | Freq: Every day | ORAL | Status: DC
  Administered 2015-05-15 – 2015-05-16 (×2): 180 mg via ORAL
  Filled 2015-05-14 (×2): qty 1

## 2015-05-14 MED ORDER — LOPERAMIDE HCL 2 MG PO CAPS: 2.0000 mg | ORAL_CAPSULE | ORAL | Status: DC | PRN

## 2015-05-14 MED ORDER — LEVOTHYROXINE SODIUM 100 MCG PO TABS
300.0000 ug | ORAL_TABLET | Freq: Every day | ORAL | Status: DC
  Administered 2015-05-15 – 2015-05-16 (×2): 300 ug via ORAL
  Filled 2015-05-14 (×2): qty 3

## 2015-05-14 MED ORDER — POTASSIUM CHLORIDE CRYS ER 20 MEQ PO TBCR
20.0000 meq | EXTENDED_RELEASE_TABLET | Freq: Two times a day (BID) | ORAL | Status: DC
  Administered 2015-05-14 – 2015-05-16 (×4): 20 meq via ORAL
  Filled 2015-05-14 (×4): qty 1

## 2015-05-14 MED ORDER — ASPIRIN 81 MG PO CHEW
81.0000 mg | CHEWABLE_TABLET | Freq: Every day | ORAL | Status: DC
  Administered 2015-05-15 – 2015-05-16 (×2): 81 mg via ORAL
  Filled 2015-05-14 (×2): qty 1

## 2015-05-14 MED ORDER — SODIUM CHLORIDE 0.9 % IV SOLN
1000.0000 mL | Freq: Once | INTRAVENOUS | Status: AC
  Administered 2015-05-14: 1000 mL via INTRAVENOUS

## 2015-05-14 MED ORDER — POTASSIUM CHLORIDE IN NACL 20-0.9 MEQ/L-% IV SOLN
INTRAVENOUS | Status: DC
  Administered 2015-05-14 – 2015-05-15 (×4): via INTRAVENOUS

## 2015-05-14 MED ORDER — POTASSIUM CHLORIDE 10 MEQ/100ML IV SOLN
10.0000 meq | Freq: Once | INTRAVENOUS | Status: AC
  Administered 2015-05-14: 10 meq via INTRAVENOUS
  Filled 2015-05-14: qty 100

## 2015-05-14 MED ORDER — ASPIRIN 81 MG PO TABS: 81.0000 mg | ORAL_TABLET | Freq: Every day | ORAL | Status: DC

## 2015-05-14 NOTE — ED Provider Notes (Signed)
CSN: [id number]     Arrival date & time 05/14/15  1631 History   First MD Initiated Contact with Patient 05/14/15 1644     Chief Complaint  Patient presents with  . Diarrhea     (Consider location/radiation/quality/duration/timing/severity/associated sxs/prior Treatment) HPI  Patient is a 69 year old male with history of stage II marginal zone lymphoma, CAD, mild thrombocytopenia, sleep apnea, obesity presenting today with diarrhea the last 3-4 days. Patient had no nausea. Patient's daughter has had the same thing. Patient states that he feels like he is dehydrated after all of the diarrhea. Sometimes he cannot make it to the bathroom. Patient's had no recent antibiotic use. No fevers. No blood per rectum. Patient's only gone 1 time today. He says the diarrhea comes and goes. Past Medical History  Diagnosis Date  . Peripheral vascular disease (HCC)     neuropathy in toes   . Shortness of breath     due ot pain in knees   . Sleep apnea     sleep study 3/29 13 ? location   . Pneumonia     hx of   . Diabetes mellitus   . Hypothyroidism   . Chronic kidney disease     stage III kidney disease   . Arthritis     knees, back   . S/P biopsy    Past Surgical History  Procedure Laterality Date  . Cholecystectomy  1992  . Total knee arthroplasty  11/10/2011    Procedure: TOTAL KNEE ARTHROPLASTY;  Surgeon: Mauri Pole, MD;  Location: [facility];  Service: Orthopedics;  Laterality: Right;  . Colonoscopy N/A 09/03/2014    SLF:six colon polyps removed/small internal hemorrhoids  . Esophagogastroduodenoscopy N/A 09/03/2014    SLF: mild gastritis/few gastric polyps   Family History  Problem Relation Age of Onset  . Colon cancer Neg Hx    Social History  Substance Use Topics  . Smoking status: Former Smoker -- 1.00 packs/day for 30 years    Quit date: 08/04/2007  . Smokeless tobacco: Never Used  . Alcohol Use: No    Review of Systems  Constitutional: Positive for fatigue. Negative for  fever, chills and activity change.  HENT: Negative for hearing loss.   Eyes: Negative for discharge and redness.  Respiratory: Negative for cough and shortness of breath.   Cardiovascular: Negative for chest pain.  Gastrointestinal: Positive for diarrhea. Negative for nausea, vomiting, abdominal pain, blood in stool and anal bleeding.  Genitourinary: Negative for dysuria and urgency.  Musculoskeletal: Negative for arthralgias.  Allergic/Immunologic: Positive for immunocompromised state.  Psychiatric/Behavioral: Negative for behavioral problems and agitation.  All other systems reviewed and are negative.     Allergies  Review of patient's allergies indicates no known allergies.  Home Medications   Prior to Admission medications   Medication Sig Start Date End Date Taking? Authorizing Provider  aspirin 81 MG tablet Take 81 mg by mouth daily.   Yes Historical Provider, MD  Insulin Glargine (LANTUS SOLOSTAR) 100 UNIT/ML Solostar Pen Inject 52 Units into the skin every morning. Pt takes in the AM   Yes Historical Provider, MD  insulin lispro (HUMALOG KWIKPEN) 100 UNIT/ML KiwkPen Inject 3-7 Units into the skin 3 (three) times daily as needed (FOR BLOOD SUGARS).    Yes Historical Provider, MD  levothyroxine (SYNTHROID, LEVOTHROID) 300 MCG tablet Take 300 mcg by mouth every morning.   Yes Historical Provider, MD  losartan (COZAAR) 100 MG tablet Take 100 mg by mouth every morning.   Yes  Historical Provider, MD  triamcinolone (NASACORT) 55 MCG/ACT AERO nasal inhaler Place 2 sprays into the nose daily as needed (allergies).   Yes Historical Provider, MD  verapamil (CALAN-SR) 180 MG CR tablet Take 180 mg by mouth daily.   Yes Historical Provider, MD  UNABLE TO FIND Apply 1 application topically as needed. Diabetic skin relief foot cream 10/10/14   Historical Provider, MD   BP 146/73 mmHg  Pulse 52  Temp(Src) 97.4 F (36.3 C) (Oral)  Resp 20  Ht 6' (1.829 m)  Wt 320 lb (145.151 kg)  BMI 43.39  kg/m2  SpO2 98% Physical Exam  Constitutional: He is oriented to person, place, and time. He appears well-nourished.  HENT:  Head: Normocephalic.  Dry mucous membranes  Eyes: Conjunctivae are normal.  Neck: No tracheal deviation present.  Cardiovascular: Normal rate.   Pulmonary/Chest: Effort normal. No stridor. No respiratory distress.  Abdominal: Soft. There is no tenderness. There is no guarding.  Obese abdomen  Musculoskeletal: Normal range of motion. He exhibits no edema.  Neurological: He is oriented to person, place, and time. No cranial nerve deficit.  Skin: Skin is warm and dry. No rash noted. He is not diaphoretic.  Psychiatric: He has a normal mood and affect. His behavior is normal.  Nursing note and vitals reviewed.   ED Course  Procedures (including critical care time) Labs Review Labs Reviewed  COMPREHENSIVE METABOLIC PANEL - Abnormal; Notable for the following:    Potassium 3.1 (*)    Glucose, Bld 149 (*)    BUN 57 (*)    Creatinine, Ser 3.13 (*)    Calcium 8.4 (*)    ALT 16 (*)    GFR calc non Af Amer 19 (*)    GFR calc Af Amer 22 (*)    All other components within normal limits  CBC WITH DIFFERENTIAL/PLATELET    Imaging Review No results found. I have personally reviewed and evaluated these images and lab results as part of my medical decision-making.   EKG Interpretation None      MDM   Final diagnoses:  AKI (acute kidney injury) [facility])   patient is a pleasant 69 year old gentleman with history of CK D, marginal zone lymphoma, obesity, sleep apnea presenting today with diarrhea for the last couple days. Patient feels that he is dehydrated. We will get labs, give fluids. We instructed patient if he is able to go for [place] that we could send stool for stool sample. We'll send him home with a cup to bring to his primary care physician if he is able to stool.  Patient denies any blood in his stool, no fevers, no abdominal pain.  Patient has AKI.  Will  treat with fluids. Given his immunocompromised state, will admit for hydration and stool study overnight.   Elwyn Lowden Julio Alm, MD 05/14/15 2033

## 2015-05-14 NOTE — ED Notes (Addendum)
Patient complaining of diarrhea for over a week. Denies pain. Denies nausea, vomiting. Patient states "I feel really weak, I think I may be dehydrated."

## 2015-05-14 NOTE — H&P (Signed)
History and Physical  [name] [name] [id number] DOB: 11-07-1945 DOA: 05/14/2015  Referring physician: Dr Thomasene Lot, ED physician PCP: Ysidro Evert, PA-C   Chief Complaint: Diarrhea, weakness  HPI: [name] [name] is a 69 y.o. 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 Patient seen for 7 days of watery, nonbloody diarrhea that appeared to improve slightly over the past few days and then worsened again. Patient has taken a few doses of Imodium, which helped a little, but the diarrhea continues to return. He has not had a lot of oral intake of either food or liquid. He does feel weak. No provoking factors. Denies recent antibiotic use.   Review of Systems:   Pt denies any fevers, chills, nausea, vomiting, constipation, abdominal pain, shortness of breath, dyspnea on exertion, orthopnea, cough, wheezing, palpitations, headache, vision changes, lightheadedness, dizziness, constipation, melena, rectal bleeding.  Review of systems are otherwise negative  Past Medical History  Diagnosis Date  . Peripheral vascular disease (HCC)     neuropathy in toes   . Shortness of breath     due ot pain in knees   . Sleep apnea     sleep study 3/29 13 ? location   . Pneumonia     hx of   . Diabetes mellitus   . Hypothyroidism   . Chronic kidney disease     stage III kidney disease   . Arthritis     knees, back   . S/P biopsy    Past Surgical History  Procedure Laterality Date  . Cholecystectomy  1992  . Total knee arthroplasty  11/10/2011    Procedure: TOTAL KNEE ARTHROPLASTY;  Surgeon: Mauri Pole, MD;  Location: [facility];  Service: Orthopedics;  Laterality: Right;  . Colonoscopy N/A 09/03/2014    SLF:six colon polyps removed/small internal hemorrhoids  . Esophagogastroduodenoscopy N/A 09/03/2014    SLF: mild gastritis/few gastric polyps   Social  History:  reports that he quit smoking about 7 years ago. He has never used smokeless tobacco. He reports that he does not drink alcohol or use illicit drugs. Patient lives at  home and is able to participate in activities of daily living   No Known Allergies  Family History  Problem Relation Age of Onset  . Colon cancer Neg Hx      Prior to Admission medications   Medication Sig Start Date End Date Taking? Authorizing Provider  aspirin 81 MG tablet Take 81 mg by mouth daily.   Yes Historical Provider, MD  Insulin Glargine (LANTUS SOLOSTAR) 100 UNIT/ML Solostar Pen Inject 52 Units into the skin every morning. Pt takes in the AM   Yes Historical Provider, MD  insulin lispro (HUMALOG KWIKPEN) 100 UNIT/ML KiwkPen Inject 3-7 Units into the skin 3 (three) times daily as needed (FOR BLOOD SUGARS).    Yes Historical Provider, MD  levothyroxine (SYNTHROID, LEVOTHROID) 300 MCG tablet Take 300 mcg by mouth every morning.   Yes Historical Provider, MD  losartan (COZAAR) 100 MG tablet Take 100 mg by mouth every morning.   Yes Historical Provider, MD  triamcinolone (NASACORT) 55 MCG/ACT AERO nasal inhaler Place 2 sprays into the nose daily as needed (allergies).   Yes Historical Provider, MD  verapamil (CALAN-SR) 180 MG CR tablet Take 180 mg by mouth daily.   Yes Historical Provider, MD  UNABLE TO FIND Apply  1 application topically as needed. Diabetic skin relief foot cream 10/10/14   Historical Provider, MD    Physical Exam: BP 146/73 mmHg  Pulse 52  Temp(Src) 97.4 F (36.3 C) (Oral)  Resp 20  Ht 6' (1.829 m)  Wt 145.151 kg (320 lb)  BMI 43.39 kg/m2  SpO2 98%  General: older male . Awake and alert and oriented x3. No acute cardiopulmonary distress.  Eyes: Pupils equal, round, reactive to light. Extraocular muscles are intact. Sclerae anicteric and noninjected.  ENT: dry  mucosal membranes. No mucosal lesions. Teeth in Moderate repair  Neck: Neck supple without lymphadenopathy. No carotid  bruits. No masses palpated.  Cardiovascular: Regular rate with normal S1-S2 sounds. No murmurs, rubs, gallops auscultated. No JVD.  Respiratory: Good respiratory effort with no wheezes, rales, rhonchi. Lungs clear to auscultation bilaterally.  Abdomen: Soft, nontender, nondistended. Active bowel sounds. No masses or hepatosplenomegaly  Skin: Dry, warm to touch. 2+ dorsalis pedis and radial pulses. Musculoskeletal: No calf or leg pain. All major joints not erythematous nontender.  Psychiatric: Intact judgment and insight.  Neurologic: No focal neurological deficits. Cranial nerves II through XII are grossly intact.           Labs on Admission:  Basic Metabolic Panel:  Recent Labs Lab 05/14/15 1717  NA 135  K 3.1*  CL 105  CO2 22  GLUCOSE 149*  BUN 57*  CREATININE 3.13*  CALCIUM 8.4*   Liver Function Tests:  Recent Labs Lab 05/14/15 1717  AST 17  ALT 16*  ALKPHOS 72  BILITOT 1.0  PROT 7.3  ALBUMIN 3.9   No results for input(s): LIPASE, AMYLASE in the last 168 hours. No results for input(s): AMMONIA in the last 168 hours. CBC:  Recent Labs Lab 05/14/15 1717  WBC 7.8  NEUTROABS 5.3  HGB 16.0  HCT 45.1  MCV 86.9  PLT 160   Cardiac Enzymes: No results for input(s): CKTOTAL, CKMB, CKMBINDEX, TROPONINI in the last 168 hours.  BNP (last 3 results) No results for input(s): BNP in the last 8760 hours.  ProBNP (last 3 results)  Recent Labs  07/23/14 1245  PROBNP 299.5*    CBG: No results for input(s): GLUCAP in the last 168 hours.  Radiological Exams on Admission: No results found.   Assessment/Plan Present on Admission:  . AKI (acute kidney injury) ([place]) . Diarrhea . 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[place]) . Dehydration  This patient was discussed with the ED physician, including pertinent vitals, physical exam findings, labs, and imaging.  We also discussed care given by the ED provider.  #1 AK I #2 diarrhea #3 dehydration #4 marginal zone lymphoma #5  diabetes #6 hypokalemia  Admit for observation  Continue IV fluids at 125 mL per hour - will give normal saline with 20 mEq of potassium  Replace potassium: 20 no equivalents twice a day  Recheck creatinine in the morning  Imodium for diarrhea: 2 mg when necessary  Sinus scale insulin with home Lantus  Likely discharge tomorrow   DVT prophylaxis: Lovenox   Consultants: none   Code Status: full code   Family Communication: none    Disposition Plan: observation   Truett Mainland, DO [facility] Pager [phone]

## 2015-05-15 DIAGNOSIS — N179 Acute kidney failure, unspecified: Secondary | ICD-10-CM | POA: Diagnosis not present

## 2015-05-15 DIAGNOSIS — R197 Diarrhea, unspecified: Secondary | ICD-10-CM

## 2015-05-15 DIAGNOSIS — E860 Dehydration: Secondary | ICD-10-CM | POA: Diagnosis not present

## 2015-05-15 LAB — BASIC METABOLIC PANEL
Anion gap: 5 (ref 5–15)
BUN: 49 mg/dL — AB (ref 6–20)
CHLORIDE: 108 mmol/L (ref 101–111)
CO2: 24 mmol/L (ref 22–32)
Calcium: 8 mg/dL — ABNORMAL LOW (ref 8.9–10.3)
Creatinine, Ser: 2.96 mg/dL — ABNORMAL HIGH (ref 0.61–1.24)
GFR calc Af Amer: 24 mL/min — ABNORMAL LOW (ref >60)
GFR calc non Af Amer: 20 mL/min — ABNORMAL LOW (ref >60)
GLUCOSE: 98 mg/dL (ref 65–99)
Potassium: 3.6 mmol/L (ref 3.5–5.1)
Sodium: 137 mmol/L (ref 135–145)

## 2015-05-15 LAB — GLUCOSE, CAPILLARY
GLUCOSE-CAPILLARY: 111 mg/dL — AB (ref 65–99)
GLUCOSE-CAPILLARY: 113 mg/dL — AB (ref 65–99)
Glucose-Capillary: 108 mg/dL — ABNORMAL HIGH (ref 65–99)
Glucose-Capillary: 119 mg/dL — ABNORMAL HIGH (ref 65–99)

## 2015-05-15 LAB — C DIFFICILE QUICK SCREEN W PCR REFLEX
C DIFFICILE (CDIFF) INTERP: NEGATIVE
C Diff antigen: NEGATIVE
C Diff toxin: NEGATIVE

## 2015-05-15 MED ORDER — LOPERAMIDE HCL 2 MG PO CAPS
2.0000 mg | ORAL_CAPSULE | ORAL | Status: DC | PRN
  Administered 2015-05-15 – 2015-05-16 (×8): 2 mg via ORAL
  Filled 2015-05-15 (×8): qty 1

## 2015-05-15 MED ORDER — ENOXAPARIN SODIUM 80 MG/0.8ML ~~LOC~~ SOLN
70.0000 mg | SUBCUTANEOUS | Status: DC
  Administered 2015-05-15: 70 mg via SUBCUTANEOUS
  Filled 2015-05-15: qty 0.8

## 2015-05-15 MED ORDER — FLUTICASONE PROPIONATE 50 MCG/ACT NA SUSP: 1.0000 | Freq: Every day | NASAL | Status: DC | PRN

## 2015-05-15 NOTE — Care Management Note (Signed)
Case Management Note  Patient Details  Name: Isandro Schorer MRN: [id number] Date of Birth: 1946/06/09  Subjective/Objective:                  Pt admitted from home with diarrhea and dehydration. Pt lives with family and will return home at discharge. Pt is independent with ADL's.  Pt has a cane and walker for home use.  Action/Plan: No CM needs anticipated.  Expected Discharge Date:                  Expected Discharge Plan:  Home/Self Care  In-House Referral:  NA  Discharge planning Services  CM Consult  Post Acute Care Choice:  NA Choice offered to:  NA  DME Arranged:    DME Agency:     HH Arranged:    HH Agency:     Status of Service:  Completed, signed off  Medicare Important Message Given:    Date Medicare IM Given:    Medicare IM give by:    Date Additional Medicare IM Given:    Additional Medicare Important Message give by:     If discussed at [place] of Stay Meetings, dates discussed:    Additional Comments:  Joylene Draft, RN 05/15/2015, 11:23 AM

## 2015-05-15 NOTE — Discharge Summary (Signed)
Physician Discharge Summary  [name] [name] [id number] DOB: Sep 14, 1945 DOA: 05/14/2015  PCP: Margaretmary Bayley D, PA-C  Admit date: 05/14/2015 Discharge date: 05/16/2015  Recommendations for Outpatient Follow-up:  1. Follow up with PCP as needed  Follow-up Information    Follow up with EDENFIELD, Leonides Schanz, PA-C.   Specialty:  Physician Assistant   Why:  As needed   Contact information:   [place] [id number] [phone]        Discharge Diagnoses:  1. 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 2. Dehydration. 3. Diarrhea.  4. Hypokalemia. 5. Marginal zone lymphoma. 6. DM type 2. 7. Essential Hypertension.  Discharge Condition: Improved Disposition: Home  Diet recommendation: Regular  Filed Weights   05/14/15 1644 05/14/15 2159  Weight: 145.151 kg (320 lb) 141.613 kg (312 lb 3.2 oz)    History of present illness:  69 y.o. 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 Patient seen for 7 days of watery, nonbloody diarrhea that appeared to improve slightly over the past few days and then worsened again. Patient has taken a few doses of Imodium, which helped a little, but the diarrhea continues to return. He has not had a lot of oral intake of either food or liquid. Admitted for further management.   Hospital Course:  Diarrhea significantly improved with Immodium and IVF. C.diff was negative. Final culture results are still pending. Given similar symptoms in multiple family members, suspect viral illness. 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 Both resolved with aggressive IVFs. Renal function back at baseline. Hypokalemia was repleted.   1. 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 Likely secondary to dehydration, resolved with IVF.  Back to baseline renal function.  2. Dehydration, resolved with IVF.  3. Diarrhea, still present but improving. Stool cultures pending. Cdiff negative. Multiple sick contacts, suspect viral illness.  4. Hypokalemia. Repleted. 5. Marginal zone lymphoma, stable, on rituximab every 2 months with his last dose at the end of August 6. DM type 2, stable. Continue SSI 7. Essential hypertension, stable.  Consultants: 8. none  Procedures:  none  Antibiotics:  none   Discharge Instructions Discharge Instructions    Diet - low sodium heart healthy    Complete by:  As directed      Diet Carb Modified    Complete by:  As directed      Discharge instructions    Complete by:  As directed   Call your physician or seek immediate medical attention for increased diarrhea, pain, fever, vomiting, inability to eat, generalized weakness or worsening of condition.     Increase activity slowly    Complete by:  As directed              Discharge Medication List as of 05/16/2015  3:30 PM    CONTINUE these medications which have NOT CHANGED   Details  aspirin 81 MG tablet Take 81 mg by mouth daily., Until Discontinued, Historical Med    Insulin Glargine (LANTUS SOLOSTAR) 100 UNIT/ML Solostar Pen Inject 52 Units into the skin every morning. Pt takes in the AM, Until Discontinued, Historical Med    insulin lispro (HUMALOG KWIKPEN) 100 UNIT/ML KiwkPen Inject 3-7 Units into the skin 3 (three) times daily as needed (FOR BLOOD SUGARS). , Until Discontinued,  Historical Med    levothyroxine (SYNTHROID, LEVOTHROID) 300 MCG tablet Take 300 mcg by mouth every morning., Until Discontinued, Historical Med    losartan (COZAAR) 100 MG tablet Take 100 mg by mouth every morning., Until Discontinued, Historical Med    triamcinolone (NASACORT) 55 MCG/ACT AERO nasal inhaler Place 2 sprays into the nose daily as needed (allergies)., Until Discontinued, Historical Med    verapamil (CALAN-SR) 180 MG CR tablet  Take 180 mg by mouth daily., Until Discontinued, Historical Med    UNABLE TO FIND Apply 1 application topically as needed. Diabetic skin relief foot cream, Starting 10/10/2014, Until Discontinued, Historical Med       No Known Allergies  The results of significant diagnostics from this hospitalization (including imaging, microbiology, ancillary and laboratory) are listed below for reference.    Significant Diagnostic Studies: No results found.  Microbiology: Recent Results (from the past 240 hour(s))  C difficile quick screen w PCR reflex     Status: None   Collection Time: 05/15/15  1:15 AM  Result Value Ref Range Status   C Diff antigen NEGATIVE NEGATIVE Final   C Diff toxin NEGATIVE NEGATIVE Final   C Diff interpretation Negative for toxigenic C. difficile  Final     Labs: Basic Metabolic Panel:  Recent Labs Lab 05/14/15 1717 05/15/15 0606  NA 135 137  K 3.1* 3.6  CL 105 108  CO2 22 24  GLUCOSE 149* 98  BUN 57* 49*  CREATININE 3.13* 2.96*  CALCIUM 8.4* 8.0*   Liver Function Tests:  Recent Labs Lab 05/14/15 1717  AST 17  ALT 16*  ALKPHOS 72  BILITOT 1.0  PROT 7.3  ALBUMIN 3.9    CBC:  Recent Labs Lab 05/14/15 1717  WBC 7.8  NEUTROABS 5.3  HGB 16.0  HCT 45.1  MCV 86.9  PLT 160     CBG:  Recent Labs Lab 05/15/15 2019 05/16/15 0750 05/16/15 0829 05/16/15 0940 05/16/15 1140  GLUCAP 119* 59* 78 83 101*    Principal Problem:   AKI (acute kidney injury) ([place]) Active Problems:   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[place])   Diarrhea   Dehydration   Hypokalemia   Time coordinating discharge: 35 minutes  Signed:  Murray Hodgkins, MD [facility] 05/16/2015, 6:55 AM  By signing my name below, I, Rosalie Doctor attest that this documentation has been prepared under the direction and in the presence of Murray Hodgkins, MD Electronically signed: Rosalie Doctor, Scribe.  05/16/2015  I personally performed the services described in this  documentation. All medical record entries made by the scribe were at my direction. I have reviewed the chart and agree that the record reflects my personal performance and is accurate and complete. Murray Hodgkins, MD

## 2015-05-15 NOTE — Progress Notes (Signed)
PROGRESS NOTE  [name] [name] [id number] DOB: 1946/03/03 DOA: 05/14/2015 PCP: Ysidro Evert, PA-C  Summary: 69 y.o. 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 Patient seen for 7 days of watery, nonbloody diarrhea that appeared to improve slightly over the past few days and then worsened again. Patient has taken a few doses of Imodium, which helped a little, but the diarrhea continues to return. He has not had a lot of oral intake of either food or liquid. Admitted for further management.   Assessment/Plan: 1. 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 Likely secondary to dehydration, improved with IVF.  2. Dehydration, improved with IVF.  3. Diarrhea, improved but still having several stools a day. Stool cultures pending.  Cdiff negative. 4. Hypokalemia. Repleted. 5. Marginal zone lymphoma, stable, on rituximab every 2 months with his last dose at the end of August 6. DM type 2, stable. Continue SSI 7. Essential hypertension, stable.    Overall improved. Continue IVF, Check BMP in morning and continue supportive care  Multiple sick contacts, suspect viral illness.   Code Status: Full code DVT prophylaxis: Lovenox Family Communication: No family at bedside. Discussed with patient who understands and has no concerns at this time. Disposition Plan: Anticipate discharge within 24 hours.   Murray Hodgkins, MD  [facility]  Pager [phone] If 7PM-7AM, please contact night-coverage at [email or web address], password [facility] 05/15/2015, 7:05 AM    Consultants:    Procedures:    Antibiotics:    HPI/Subjective: Feels better but still has some diarrhea. Reports 2 episodes this morning but several times throughout the night. No gross blood. Denies any nausea, vomiting, or abdominal pain.  Reports family members have similar symptoms.     Objective: Filed Vitals:   05/14/15 1913 05/14/15 1930 05/14/15 2159 05/15/15 0430  BP: 146/73 143/70 141/64 137/87  Pulse: 52 49 55 50  Temp: 97.4 F (36.3 C)  97.8 F (36.6 C) 98 F (36.7 C)  TempSrc: Oral  Oral Oral  Resp: 20  20 20   Height:      Weight:   141.613 kg (312 lb 3.2 oz)   SpO2: 98% 96% 98% 94%   No intake or output data in the 24 hours ending 05/15/15 0705   Filed Weights   05/14/15 1644 05/14/15 2159  Weight: 145.151 kg (320 lb) 141.613 kg (312 lb 3.2 oz)    Exam:   VSS, afebrile, not hypoxic General:  Appears comfortable, calm. Cardiovascular: Regular rate and rhythm, no murmur, rub or gallop. No lower extremity edema. Respiratory: Clear to auscultation bilaterally, no wheezes, rales or rhonchi. Normal respiratory effort. Abdomen: soft, ntnd, hyperactive bowel sounds Musculoskeletal: grossly normal tone bilateral upper and lower extremities Psychiatric: grossly normal mood and affect, speech fluent and appropriate Neurologic: grossly non-focal.   New data reviewed:  Creatinine improved at 2.96, BUN 49  Pertinent data since admission:    Pending data:  Stool culture  Scheduled Meds: . aspirin  81 mg Oral Daily  . enoxaparin (LOVENOX) injection  30 mg Subcutaneous Q24H  . insulin aspart  0-20 Units Subcutaneous TID WC  . insulin aspart  0-5 Units Subcutaneous QHS  . insulin glargine  52 Units Subcutaneous Daily  . levothyroxine  300 mcg Oral QAC breakfast  . losartan  100 mg Oral Daily  . potassium chloride  20 mEq Oral BID  .  verapamil  180 mg Oral Daily   Continuous Infusions: . 0.9 % NaCl with KCl 20 mEq / L 125 mL/hr at 05/15/15 [id number]    Principal Problem:   AKI (acute kidney injury) ([place]) Active Problems:   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[place])   Diarrhea   Dehydration   Hypokalemia  Time spent: 25 MInutes   By signing my name below, I, Rosalie Doctor attest that this documentation has been prepared under the direction and in  the presence of Murray Hodgkins, MD Electronically signed: Rosalie Doctor, Scribe. 05/15/2015 11:54am  I personally performed the services described in this documentation. All medical record entries made by the scribe were at my direction. I have reviewed the chart and agree that the record reflects my personal performance and is accurate and complete. Murray Hodgkins, MD

## 2015-05-16 LAB — BASIC METABOLIC PANEL
Anion gap: 6 (ref 5–15)
BUN: 36 mg/dL — AB (ref 6–20)
CHLORIDE: 113 mmol/L — AB (ref 101–111)
CO2: 20 mmol/L — AB (ref 22–32)
CREATININE: 2.39 mg/dL — AB (ref 0.61–1.24)
Calcium: 7.7 mg/dL — ABNORMAL LOW (ref 8.9–10.3)
GFR calc non Af Amer: 26 mL/min — ABNORMAL LOW (ref >60)
GFR, EST AFRICAN AMERICAN: 30 mL/min — AB (ref >60)
GLUCOSE: 85 mg/dL (ref 65–99)
Potassium: 3.6 mmol/L (ref 3.5–5.1)
Sodium: 139 mmol/L (ref 135–145)

## 2015-05-16 LAB — GLUCOSE, CAPILLARY
GLUCOSE-CAPILLARY: 83 mg/dL (ref 65–99)
Glucose-Capillary: 59 mg/dL — ABNORMAL LOW (ref 65–99)
Glucose-Capillary: 78 mg/dL (ref 65–99)
Glucose-Capillary: 101 mg/dL — ABNORMAL HIGH (ref 65–99)

## 2015-05-16 NOTE — Progress Notes (Signed)
PROGRESS NOTE  [name] [name] [id number] DOB: 1946-01-18 DOA: 05/14/2015 PCP: Ysidro Evert, PA-C  Summary: 69 y.o. 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 Patient seen for 7 days of watery, nonbloody diarrhea that appeared to improve slightly over the past few days and then worsened again. Patient has taken a few doses of Imodium, which helped a little, but the diarrhea continues to return. He has not had a lot of oral intake of either food or liquid. Admitted for further management.   Assessment/Plan: 1. 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 Likely secondary to dehydration, resolved with IVF. Back to baseline renal function.  2. Dehydration, resolved with IVF.  3. Diarrhea, still present but improving. Stool cultures pending.  Cdiff negative. Multiple sick contacts, suspect viral illness.  4. Hypokalemia. Repleted. 5. Marginal zone lymphoma, stable, on rituximab every 2 months with his last dose at the end of August 6. DM type 2, stable. Continue SSI 7. Essential hypertension, stable.    Overall improved. Discharge home today.   Code Status: Full code DVT prophylaxis: Lovenox Family Communication: No family at bedside. Discussed with patient who understands and has no concerns at this time. Disposition Plan: Discharge home today.  Murray Hodgkins, MD  [facility]  Pager [phone] If 7PM-7AM, please contact night-coverage at [email or web address], password [facility] 05/16/2015, 6:52 AM    Consultants:    Procedures:    Antibiotics:    HPI/Subjective: Feels better. Still has diarrhea but not as frequently. Denies any nausea, vomiting or pain. Has an appetite.   Objective: Filed Vitals:   05/15/15 0430 05/15/15 1407 05/15/15 2017 05/16/15 0623  BP: 137/87 134/73 121/56 155/65  Pulse: 50 49 48 52  Temp: 98 F (36.7  C) 97.6 F (36.4 C) 98.3 F (36.8 C) 97.9 F (36.6 C)  TempSrc: Oral  Oral Oral  Resp: 20 20 21 20   Height:      Weight:      SpO2: 94% 97% 97% 98%    Intake/Output Summary (Last 24 hours) at 05/16/15 0652 Last data filed at 05/15/15 1853  Gross per 24 hour  Intake   3000 ml  Output      0 ml  Net   3000 ml     Filed Weights   05/14/15 1644 05/14/15 2159  Weight: 145.151 kg (320 lb) 141.613 kg (312 lb 3.2 oz)    Exam:    VSS, afebrile, not hypoxic General:  Appears calm and comfortable Cardiovascular: RRR, no m/r/g. No LE edema. Respiratory: CTA bilaterally, no w/r/r. Normal respiratory effort. Abdomen: soft, ntnd, positive bowel sounds Psychiatric: grossly normal mood and affect, speech fluent and appropriate  New data reviewed:  BUN 36, Creatinine 2.39 - improving  CBG stable  Pertinent data since admission:    Pending data:  Stool culture  Scheduled Meds: . aspirin  81 mg Oral Daily  . enoxaparin (LOVENOX) injection  70 mg Subcutaneous Q24H  . insulin aspart  0-20 Units Subcutaneous TID WC  . insulin aspart  0-5 Units Subcutaneous QHS  . insulin glargine  52 Units Subcutaneous Daily  . levothyroxine  300 mcg Oral QAC breakfast  . losartan  100 mg Oral Daily  . potassium chloride  20 mEq Oral BID  . verapamil  180 mg Oral Daily   Continuous Infusions: . 0.9 % NaCl with KCl 20 mEq /  L 125 mL/hr at 05/15/15 2234    Principal Problem:   AKI (acute kidney injury) ([place]) Active Problems:   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[place])   Diarrhea   Dehydration   Hypokalemia   By signing my name below, I, Rosalie Doctor attest that this documentation has been prepared under the direction and in the presence of Murray Hodgkins, MD Electronically signed: Rosalie Doctor, Scribe. 05/16/2015 2:30pm  I personally performed the services described in this documentation. All medical record entries made by the scribe were at my direction. I have reviewed the chart and  agree that the record reflects my personal performance and is accurate and complete. Murray Hodgkins, MD

## 2015-05-16 NOTE — Progress Notes (Signed)
Patient alert and oriented, independent, VSS, pt. Tolerating diet well. No complaints of pain or nausea. Pt. Had IV removed tip intact. Pt. Had prescriptions given. Pt. Voiced understanding of discharge instructions with no further questions. Pt. Discharged via wheelchair with auxilliary.  

## 2015-05-16 NOTE — Progress Notes (Signed)
Inpatient Diabetes Program Recommendations  AACE/ADA: New Consensus Statement on Inpatient Glycemic Control (2015)  Target Ranges:  Prepandial:   less than 140 mg/dL      Peak postprandial:   less than 180 mg/dL (1-2 hours)      Critically ill patients:  140 - 180 mg/dL   Results for TICE, MCMEANS (MRN [id number]) as of 05/16/2015 08:23  Ref. Range 05/16/2015 07:50  Glucose-Capillary Latest Ref Range: 65-99 mg/dL 59 (L)   Review of Glycemic Control  Diabetes history: DM 2 Outpatient Diabetes medications: Lantus 52 units Daily, Humalog 3-7 units TID Current orders for Inpatient glycemic control: Lantus 52 units Daily, Novolog Resistant TID + HS scale  Inpatient Diabetes Program Recommendations:  Insulin - Basal: Patient had hypoglycemia at 59 mg/dl this am. Please consider reducing basal insulin to 48 units Daily.  Thanks,  Tama Headings RN, MSN, [facility] Inpatient Diabetes Coordinator Team Pager [phone] (8a-5p)

## 2015-05-19 LAB — STOOL CULTURE

## 2015-05-21 ENCOUNTER — Inpatient Hospital Stay (HOSPITAL_COMMUNITY): Payer: Medicare HMO

## 2015-05-21 ENCOUNTER — Ambulatory Visit (HOSPITAL_COMMUNITY): Payer: Medicare HMO | Admitting: Hematology & Oncology

## 2015-05-21 ENCOUNTER — Other Ambulatory Visit (HOSPITAL_COMMUNITY): Payer: Medicare HMO

## 2015-05-28 ENCOUNTER — Encounter (HOSPITAL_COMMUNITY): Payer: Medicare HMO

## 2015-05-28 ENCOUNTER — Ambulatory Visit (HOSPITAL_COMMUNITY): Payer: Medicare HMO | Admitting: Hematology & Oncology

## 2015-05-28 ENCOUNTER — Encounter (HOSPITAL_COMMUNITY): Payer: Self-pay | Admitting: Hematology & Oncology

## 2015-05-28 ENCOUNTER — Other Ambulatory Visit (HOSPITAL_COMMUNITY): Payer: Medicare HMO

## 2015-05-28 ENCOUNTER — Encounter (HOSPITAL_BASED_OUTPATIENT_CLINIC_OR_DEPARTMENT_OTHER): Payer: Medicare HMO | Admitting: Hematology & Oncology

## 2015-05-28 ENCOUNTER — Inpatient Hospital Stay (HOSPITAL_COMMUNITY): Payer: Medicare HMO

## 2015-05-28 ENCOUNTER — Encounter (HOSPITAL_COMMUNITY): Payer: Medicare HMO | Attending: Hematology & Oncology

## 2015-05-28 VITALS — BP 163/78 | HR 56 | Temp 97.8°F | Resp 20

## 2015-05-28 DIAGNOSIS — C8580 Other specified types of non-Hodgkin lymphoma, unspecified site: Secondary | ICD-10-CM | POA: Insufficient documentation

## 2015-05-28 DIAGNOSIS — Z5112 Encounter for antineoplastic immunotherapy: Secondary | ICD-10-CM

## 2015-05-28 DIAGNOSIS — D696 Thrombocytopenia, unspecified: Secondary | ICD-10-CM

## 2015-05-28 DIAGNOSIS — N189 Chronic kidney disease, unspecified: Secondary | ICD-10-CM | POA: Diagnosis not present

## 2015-05-28 DIAGNOSIS — C8589 Other specified types of non-Hodgkin lymphoma, extranodal and solid organ sites: Secondary | ICD-10-CM | POA: Diagnosis not present

## 2015-05-28 LAB — COMPREHENSIVE METABOLIC PANEL
ALBUMIN: 3.8 g/dL (ref 3.5–5.0)
ALK PHOS: 96 U/L (ref 38–126)
ALT: 21 U/L (ref 17–63)
AST: 22 U/L (ref 15–41)
Anion gap: 8 (ref 5–15)
BUN: 25 mg/dL — AB (ref 6–20)
CALCIUM: 9.3 mg/dL (ref 8.9–10.3)
CO2: 25 mmol/L (ref 22–32)
CREATININE: 2.24 mg/dL — AB (ref 0.61–1.24)
Chloride: 106 mmol/L (ref 101–111)
GFR calc Af Amer: 33 mL/min — ABNORMAL LOW (ref >60)
GFR calc non Af Amer: 28 mL/min — ABNORMAL LOW (ref >60)
GLUCOSE: 173 mg/dL — AB (ref 65–99)
Potassium: 3.9 mmol/L (ref 3.5–5.1)
SODIUM: 139 mmol/L (ref 135–145)
Total Bilirubin: 0.7 mg/dL (ref 0.3–1.2)
Total Protein: 7 g/dL (ref 6.5–8.1)

## 2015-05-28 LAB — CBC WITH DIFFERENTIAL/PLATELET
BASOS PCT: 1 %
Basophils Absolute: 0.1 10*3/uL (ref 0.0–0.1)
Eosinophils Absolute: 0.3 10*3/uL (ref 0.0–0.7)
Eosinophils Relative: 5 %
HCT: 45.2 % (ref 39.0–52.0)
HEMOGLOBIN: 15.6 g/dL (ref 13.0–17.0)
Lymphocytes Relative: 27 %
Lymphs Abs: 1.5 10*3/uL (ref 0.7–4.0)
MCH: 30.6 pg (ref 26.0–34.0)
MCHC: 34.5 g/dL (ref 30.0–36.0)
MCV: 88.6 fL (ref 78.0–100.0)
MONOS PCT: 7 %
Monocytes Absolute: 0.4 10*3/uL (ref 0.1–1.0)
NEUTROS ABS: 3.4 10*3/uL (ref 1.7–7.7)
NEUTROS PCT: 60 %
Platelets: 109 10*3/uL — ABNORMAL LOW (ref 150–400)
RBC: 5.1 MIL/uL (ref 4.22–5.81)
RDW: 15.6 % — ABNORMAL HIGH (ref 11.5–15.5)
SMEAR REVIEW: DECREASED
WBC: 5.6 10*3/uL (ref 4.0–10.5)

## 2015-05-28 LAB — LACTATE DEHYDROGENASE: LDH: 162 U/L (ref 98–192)

## 2015-05-28 MED ORDER — DIPHENHYDRAMINE HCL 25 MG PO CAPS
ORAL_CAPSULE | ORAL | Status: AC
  Filled 2015-05-28: qty 2

## 2015-05-28 MED ORDER — SODIUM CHLORIDE 0.9 % IJ SOLN
10.0000 mL | INTRAMUSCULAR | Status: DC | PRN
  Administered 2015-05-28: 10 mL
  Filled 2015-05-28: qty 10

## 2015-05-28 MED ORDER — ACETAMINOPHEN 325 MG PO TABS
ORAL_TABLET | ORAL | Status: AC
  Filled 2015-05-28: qty 2

## 2015-05-28 MED ORDER — SODIUM CHLORIDE 0.9 % IV SOLN
375.0000 mg/m2 | Freq: Once | INTRAVENOUS | Status: AC
  Administered 2015-05-28: 1000 mg via INTRAVENOUS
  Filled 2015-05-28: qty 100

## 2015-05-28 MED ORDER — ACETAMINOPHEN 325 MG PO TABS
650.0000 mg | ORAL_TABLET | Freq: Once | ORAL | Status: AC
  Administered 2015-05-28: 650 mg via ORAL

## 2015-05-28 MED ORDER — DIPHENHYDRAMINE HCL 25 MG PO CAPS
50.0000 mg | ORAL_CAPSULE | Freq: Once | ORAL | Status: AC
  Administered 2015-05-28: 50 mg via ORAL

## 2015-05-28 MED ORDER — SODIUM CHLORIDE 0.9 % IV SOLN
Freq: Once | INTRAVENOUS | Status: AC
  Administered 2015-05-28: 09:00:00 via INTRAVENOUS

## 2015-05-28 NOTE — Patient Instructions (Signed)
[place] at [facility] Discharge Instructions  RECOMMENDATIONS MADE BY THE CONSULTANT AND ANY TEST RESULTS WILL BE SENT TO YOUR REFERRING PHYSICIAN.  Exam completed by Dr Whitney Muse today CT scan Return to see the doctor after the CT scan. Follow up as scheduled Please call the clinic if you have any questions or concerns  Thank you for choosing [place] at [facility] to provide your oncology and hematology care.  To afford each patient quality time with our provider, please arrive at least 15 minutes before your scheduled appointment time.    You need to re-schedule your appointment should you arrive 10 or more minutes late.  We strive to give you quality time with our providers, and arriving late affects you and other patients whose appointments are after yours.  Also, if you no show three or more times for appointments you may be dismissed from the clinic at the providers discretion.     Again, thank you for choosing [facility].  Our hope is that these requests will decrease the amount of time that you wait before being seen by our physicians.       _____________________________________________________________  Should you have questions after your visit to [facility], please contact our office at [phone] between the hours of 8:30 a.m. and 4:30 p.m.  Voicemails left after 4:30 p.m. will not be returned until the following business day.  For prescription refill requests, have your pharmacy contact our office.

## 2015-05-28 NOTE — Progress Notes (Signed)
Tolerated infusion w/o adverse reaction. A&Ox4, in no distress.  VSS.  Discharged ambulatory.  

## 2015-05-28 NOTE — Progress Notes (Signed)
[place] PROGRESS NOTE  Patient Care Team: Ysidro Evert, PA-C as PCP - General (Physician Assistant)  CHIEF COMPLAINTS/PURPOSE OF CONSULTATION:  Marginal Zone Lymphoma EGD 09/03/2014 negative for intestinal metaplasia, dysplasia or malignancy H pylori negative Colonoscopy on 09/03/2014 with 6 polyps (tubular adenomas), redundant left colon  Marginal zone lymphoma   Staging form: Lymphoid Neoplasms, AJCC 6th Edition     Clinical stage from 11/07/2014: Stage II - Unsigned   HISTORY OF PRESENTING ILLNESS:  [name] [name] 69 y.o. male is here because of Stage II marginal zone lymphoma.  He was admitted to [facility] in December 2015 with abdominal pain. The pain was described as severe. He also reported episodes of vomiting. At presentation he had elevated liver function tests and bilirubin, CT scan of the abdomen and pelvis showed a possible bladder mass abutting the colon.  He underwent a colonoscopy on 09/03/2014 with Dr. Oneida Alar. No mass was noted on exam. 6 polyps were removed all without dysplasia on final pathology, and a redundant left colon was noted. He ultimately underwent a CT-guided biopsy of the pelvic mass on February 25, with final pathology consistent with a low-grade non-Hodgkin's lymphoma, marginal zone type.  The patient is here today with his lady friend, [name] [name]. He has been eating and sleeping well. He stays as active as his knees allow him to. He recently had one of his front teeth removed.  He denies chest pain, or pain anywhere except for his feet.  He notes using lotion on his legs daily.   He plans to have his flu shot in November. He is without any other complaints or concerns. He is here for ongoing Rituxan therapy. //  The patient was just recently hospitalized for diarrhea that lasted the 3 weeks.  Three of his other family members also had this condition and he assumes that this was just a virus.  This has now subsided. On review of his  discharge summary he was admitted also with dehydration, acute on chronic kidney disease.  He is here today to review recent PET/CT.     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[place])   07/23/2014 Imaging CT C/A/P with soft tissue mass in pelvis, LN adjacent to distal esophagus, upper abdomen a partially calcified soft tissue mass   09/27/2014 Initial Biopsy CT guided biopsy   10/17/2014 PET scan Hypermetabolic mass left adjacent to the bladder. The mass is mild to moderate in metabolic activity consistent with low-grade lymphoma. Mild left external iliac and common iliac metabolic adenopathy.Single retroperitoneal node anterior to L renal vein   10/31/2014 - 11/21/2014 Chemotherapy Single Agent Rituxan weekly x 4     MEDICAL HISTORY:  Past Medical History  Diagnosis Date  . Peripheral vascular disease (HCC)     neuropathy in toes   . Shortness of breath     due ot pain in knees   . Sleep apnea     sleep study 3/29 13 ? location   . Pneumonia     hx of   . Diabetes mellitus   . Hypothyroidism   . Chronic kidney disease     stage III kidney disease   . S/P biopsy     SURGICAL HISTORY: Past Surgical History  Procedure Laterality Date  . Cholecystectomy  1992  . Total knee arthroplasty  11/10/2011    Procedure: TOTAL KNEE ARTHROPLASTY;  Surgeon: Mauri Pole, MD;  Location: [facility] ORS;  Service: Orthopedics;  Laterality: Right;  . Colonoscopy N/A 09/03/2014  SLF:six colon polyps removed/small internal hemorrhoids  . Esophagogastroduodenoscopy N/A 09/03/2014    SLF: mild gastritis/few gastric polyps    SOCIAL HISTORY: Social History   Social History  . Marital Status: Divorced    Spouse Name: N/A  . Number of Children: N/A  . Years of Education: N/A   Occupational History  . Not on file.   Social History Main Topics  . Smoking status: Former Smoker -- 1.00 packs/day for 30 years    Quit date: 08/04/2007  . Smokeless tobacco: Never Used  . Alcohol Use: No  . Drug Use: No  . Sexual  Activity: Not on file   Other Topics Concern  . Not on file   Social History Narrative    FAMILY HISTORY: Family History  Problem Relation Age of Onset  . Colon cancer Neg Hx    has no family status information on file.   ALLERGIES:  has No Known Allergies.  MEDICATIONS:  Current Outpatient Prescriptions  Medication Sig Dispense Refill  . aspirin 81 MG tablet Take 81 mg by mouth daily.    . Insulin Glargine (LANTUS SOLOSTAR) 100 UNIT/ML Solostar Pen Inject 52 Units into the skin every morning. Pt takes in the AM    . insulin lispro (HUMALOG KWIKPEN) 100 UNIT/ML KiwkPen Inject 3-7 Units into the skin 3 (three) times daily as needed (FOR BLOOD SUGARS).     Marland Kitchen levothyroxine (SYNTHROID, LEVOTHROID) 300 MCG tablet Take 300 mcg by mouth every morning.    Marland Kitchen losartan (COZAAR) 100 MG tablet Take 100 mg by mouth every morning.    . triamcinolone (NASACORT) 55 MCG/ACT AERO nasal inhaler Place 2 sprays into the nose daily as needed (allergies).    Marland Kitchen UNABLE TO FIND Apply 1 application topically as needed. Diabetic skin relief foot cream    . verapamil (CALAN-SR) 180 MG CR tablet Take 180 mg by mouth daily.     No current facility-administered medications for this visit.    Review of Systems  Constitutional: Negative for fever, chills, weight loss and malaise/fatigue.  HENT: Negative for congestion, hearing loss, nosebleeds, sore throat and tinnitus.   Eyes: Negative for blurred vision, double vision, pain and discharge.  Respiratory: Negative for cough, hemoptysis, sputum production, shortness of breath and wheezing.   Cardiovascular: Negative for chest pain, palpitations, claudication, leg swelling and PND.  Gastrointestinal: Negative for heartburn, nausea, vomiting, abdominal pain, diarrhea, constipation, blood in stool and melena.  Genitourinary: Negative for dysuria, urgency, frequency and hematuria.  Musculoskeletal: Negative for myalgias, joint pain and falls.  Skin: Negative for  itching and rash.  Neurological: Negative for dizziness, tingling, tremors, sensory change, speech change, focal weakness, seizures, loss of consciousness, weakness and headaches.  Endo/Heme/Allergies: Does not bruise/bleed easily.  Psychiatric/Behavioral: Negative for depression, suicidal ideas, memory loss and substance abuse. The patient is not nervous/anxious and does not have insomnia.   14 point review of systems was performed and is negative except as detailed under history of present illness and above   PHYSICAL EXAMINATION: ECOG PERFORMANCE STATUS: 0 - Asymptomatic  Filed Vitals:   05/28/15 0900  BP: 181/80  Pulse: 63  Temp: 97.5 F (36.4 C)  Resp: 20   Filed Weights   05/28/15 0900  Weight: 315 lb (142.883 kg)   Physical Exam  Constitutional: He is oriented to person, place, and time and well-developed, well-nourished, and in no distress.  Obese  HENT:  Head: Normocephalic and atraumatic.  Nose: Nose normal.  Mouth/Throat: Oropharynx is clear and  moist. No oropharyngeal exudate.  Eyes: Conjunctivae and EOM are normal. Pupils are equal, round, and reactive to light. Right eye exhibits no discharge. Left eye exhibits no discharge. No scleral icterus.  Neck: Normal range of motion. Neck supple. No tracheal deviation present. No thyromegaly present.  Cardiovascular: Normal rate, regular rhythm and normal heart sounds.  Exam reveals no gallop and no friction rub.  No murmur heard. Pulmonary/Chest: Effort normal and breath sounds normal. He has no wheezes. He has no rales.  Abdominal: Soft. Bowel sounds are normal. He exhibits no distension and no mass. There is no tenderness. There is no rebound and no guarding.  Musculoskeletal: Normal range of motion. He exhibits no edema.  Chronic LE skin/vascular changes, improvement noted.  Lymphadenopathy:    He has no cervical adenopathy.  Neurological: He is alert and oriented to person, place, and time. He has normal reflexes. No  cranial nerve deficit. Gait normal. Coordination normal.  Skin: Skin is warm and dry. No rash noted.  Psychiatric: Mood, memory, affect and judgment normal.  Nursing note and vitals reviewed.   LABORATORY DATA:  I have reviewed the data as listed Lab Results  Component Value Date   WBC 5.6 05/28/2015   HGB 15.6 05/28/2015   HCT 45.2 05/28/2015   MCV 88.6 05/28/2015   PLT 109* 05/28/2015     Chemistry      Component Value Date/Time   NA 139 05/16/2015 0935   K 3.6 05/16/2015 0935   CL 113* 05/16/2015 0935   CO2 20* 05/16/2015 0935   BUN 36* 05/16/2015 0935   CREATININE 2.39* 05/16/2015 0935      Component Value Date/Time   CALCIUM 7.7* 05/16/2015 0935   ALKPHOS 72 05/14/2015 1717   AST 17 05/14/2015 1717   ALT 16* 05/14/2015 1717   BILITOT 1.0 05/14/2015 1717     Soft tissue mass, biopsy, adjacent to urinary bladder - ATYPICAL LYMPHOID PROLIFERATION CONSISTENT WITH NON-HODGKIN'S B-CELL LYMPHOMA. - SEE ONCOLOGY TABLE. Histologic type: Non-Hodgkin's lymphoma, see comment. Grade (if applicable): Low grade. Flow cytometry: No tissue is available for analysis since the specimen was received in formalin. Immunohistochemical stains: BCL-6, CD3, CD5, CD10, CD20, CD21, CD34, CD43, CD79a, CD138, Cyclin D-1, kappa, lambda, TdT, and Ki-67 with appropriate controls. Touch preps/imprints: Not performed. Comments: The sections show needle core biopsy of soft tissue displaying a very dense and relatively monomorphic infiltrate of small lymphoid cells with high nuclear cytoplasmic ratio, round to irregular nuclei, dense chromatin, and small to inconspicuous nucleoli. The appearance is diffuse with lack of obvious follicular structures or proliferation centers. No necrosis or conspicuous mitosis is identified. To further evaluate this process, immunohistochemical stains were performed and show that the overwhelming majority of lymphocytes consist of B-cells, as highlighted with CD20 and  CD79a. B-lymphocytes show no significant staining with BCL-6, CD10, CD5, Cyclin D-1, CD34, or TdT. CD21 highlights scattered small foci of dendritic networks throughout the core biopsies. CD138 highlights a minor plasma cell component, which consist of scattered cells and variably sized but predominantly small clusters. Kappa and lambda stains failed to show any significant staining and are considered non-contributory. Ki-67 shows very low expression (less than 5%). There is an admixed minor T-cell component present as seen with CD3, CD5, and CD43. The overall features are consistent with low grade non-Hodgkin's B-cell lymphoma and the overall phenotypic features favor marginal zone type. Clinical correlation is strongly recommended. (BNS:ds 10/01/14)   RADIOGRAPHIC STUDIES: I have personally reviewed the radiological images as listed  and agreed with the findings in the report.  CLINICAL DATA: Subsequent treatment strategy for non-Hodgkin's (marginal zone) lymphoma on Rituxan.  EXAM: NUCLEAR MEDICINE PET SKULL BASE TO THIGH  TECHNIQUE: 16.6 mCi F-18 FDG was injected intravenously. Full-ring PET imaging was performed from the skull base to thigh after the radiotracer. CT data was obtained and used for attenuation correction and anatomic localization.  FASTING BLOOD GLUCOSE: Value: 97 mg/dl  COMPARISON: 10/17/2014 PET-CT.  FINDINGS: NECK  Significant head/neck motion between the PET and CT portions of the study limits evaluation of the head and neck. No appreciable hypermetabolic lymph nodes in the neck.  CHEST  No hypermetabolic axillary, mediastinal or hilar nodes. There is atherosclerosis of the thoracic aorta, the great vessels of the mediastinum and the coronary arteries, including calcified atherosclerotic plaque in the left anterior descending, left circumflex and right coronary arteries. Stable 3 mm solid right upper lobe pulmonary nodule (series 8/ image  16), below PET resolution. No acute consolidative airspace disease or new significant pulmonary nodules.  ABDOMEN/PELVIS  Stable mild to moderate splenomegaly (craniocaudal splenic length 16.4 cm) without splenic hypermetabolism.  There is a mildly hypermetabolic 7.7 x 4.9 cm anterior left pelvic mass draping over the left bladder wall (series 4/image 191) with max SUV 5.2, previously 7.7 x 5.4 cm with max SUV 4.4, not appreciably changed in size, with minimally increased FDG uptake.  There is a mildly enlarged mildly hypermetabolic 1.4 cm left upper retroperitoneal node just anterior to the left renal vein (4/130) with max SUV 4.9, previously 1.4 cm with max SUV 4.8, not appreciably changed.  There is a non hypermetabolic mildly enlarged 1.2 cm left common iliac node (4/170) with max SUV 2.5, previously 1.4 cm with max SUV 3.3, decreased in size and uptake.  A 0.7 cm left external iliac node (4/183) demonstrates max SUV 3.4, unchanged in size and metabolism. No new hypermetabolic lymph nodes in the abdomen or pelvis.  No abnormal hypermetabolic activity within the liver, pancreas or adrenal glands. Status post cholecystectomy. Stable mild prostatomegaly.  There is a 3.9 cm partially calcified complex fluid density structure in the midline far upper anterior abdomen, just below the xiphoid process, which demonstrates mild hypermetabolism with max SUV 4.3, stable in size with previous max SUV 3.1. Stable small left greater than right fat containing bilateral inguinal hernias.  SKELETON  No focal hypermetabolic activity to suggest skeletal metastasis.  IMPRESSION: 1. Slight metabolic progression of 7.7 cm anterior pelvic mass draping over the left bladder wall. 2. Stable mildly hypermetabolic left upper retroperitoneal and left external iliac lymphadenopathy. 3. Metabolic response of the left common iliac lymphadenopathy, which is now non-hypermetabolic. 4.  Overall, the metabolic response is mixed. No new sites of hypermetabolic disease. 5. Stable size of partially calcified mildly hypermetabolic 3.9 cm complex fluid density structure in the midline anterior far upper abdomen just below the xiphoid process, indeterminate, possibly post-traumautic. 6. Stable subcentimeter right upper lobe pulmonary nodule, below PET resolution. 7. Atherosclerosis, including three-vessel coronary artery disease. Please note that although the presence of coronary artery calcium documents the presence of coronary artery disease, the severity of this disease and any potential stenosis cannot be assessed on this non-gated CT examination.   Electronically Signed  By: Ilona Sorrel M.D.  On: 05/31/2015 10:18  ASSESSMENT & PLAN:   Stage II marginal zone lymphoma CKD Mild Thrombocytopenia  He has done well  with single agent Rituxan. His disease is indolent, but based upon the location of the  pelvic tumor (ie. Between the bladder and rectum) we opted to proceed with single agent Rituxan. He is currently on Q 2 month Rituxan. I reviewed his imaging results and advised him that it shows a mixed response -- improved/stable disease.  We discussed XRT to the pelvic mass as I was hoping for a better response with rituxan therapy. He would be willing to take XRT if recommended. We will continue to discuss this at his next follow-up.  I am going to discuss with Dr. Tammi Klippel in the interim.  Mr. Mayberry does not need any refills at this time.  All questions were answered. The patient knows to call the clinic with any problems, questions or concerns.   This note was electronically signed.   This document serves as a record of services personally performed by Ancil Linsey, MD. It was created on her behalf by Janace Hoard, a trained medical scribe. The creation of this record is based on the scribe's personal observations and the provider's statements to them. This  document has been checked and approved by the attending provider.  I have reviewed the above documentation for accuracy and completeness, and I agree with the above.   Kelby Fam. Whitney Muse, MD

## 2015-05-28 NOTE — Patient Instructions (Signed)
[place] at [facility] Discharge Instructions  RECOMMENDATIONS MADE BY THE CONSULTANT AND ANY TEST RESULTS WILL BE SENT TO YOUR REFERRING PHYSICIAN.  Rituxan infusion today. CT scan as scheduled. Office visit as scheduled.  Rituxan infusion as scheduled.   Thank you for choosing [place] at [facility] to provide your oncology and hematology care.  To afford each patient quality time with our provider, please arrive at least 15 minutes before your scheduled appointment time.    You need to re-schedule your appointment should you arrive 10 or more minutes late.  We strive to give you quality time with our providers, and arriving late affects you and other patients whose appointments are after yours.  Also, if you no show three or more times for appointments you may be dismissed from the clinic at the providers discretion.     Again, thank you for choosing [facility].  Our hope is that these requests will decrease the amount of time that you wait before being seen by our physicians.       _____________________________________________________________  Should you have questions after your visit to [facility], please contact our office at [phone] between the hours of 8:30 a.m. and 4:30 p.m.  Voicemails left after 4:30 p.m. will not be returned until the following business day.  For prescription refill requests, have your pharmacy contact our office.

## 2015-05-31 ENCOUNTER — Ambulatory Visit (HOSPITAL_COMMUNITY)
Admission: RE | Admit: 2015-05-31 | Discharge: 2015-05-31 | Disposition: A | Discharge status: Home / Self Care | Payer: Medicare HMO | Source: Ambulatory Visit | Attending: Hematology & Oncology | Admitting: Hematology & Oncology

## 2015-05-31 DIAGNOSIS — C8580 Other specified types of non-Hodgkin lymphoma, unspecified site: Secondary | ICD-10-CM | POA: Insufficient documentation

## 2015-05-31 DIAGNOSIS — I700 Atherosclerosis of aorta: Secondary | ICD-10-CM | POA: Insufficient documentation

## 2015-05-31 DIAGNOSIS — R911 Solitary pulmonary nodule: Secondary | ICD-10-CM | POA: Insufficient documentation

## 2015-05-31 DIAGNOSIS — Z79899 Other long term (current) drug therapy: Secondary | ICD-10-CM | POA: Insufficient documentation

## 2015-05-31 DIAGNOSIS — R161 Splenomegaly, not elsewhere classified: Secondary | ICD-10-CM | POA: Insufficient documentation

## 2015-05-31 DIAGNOSIS — I2510 Atherosclerotic heart disease of native coronary artery without angina pectoris: Secondary | ICD-10-CM | POA: Diagnosis not present

## 2015-05-31 DIAGNOSIS — K4020 Bilateral inguinal hernia, without obstruction or gangrene, not specified as recurrent: Secondary | ICD-10-CM | POA: Insufficient documentation

## 2015-05-31 LAB — GLUCOSE, CAPILLARY: Glucose-Capillary: 97 mg/dL (ref 65–99)

## 2015-05-31 MED ORDER — DARBEPOETIN ALFA 150 MCG/0.3ML IJ SOSY
PREFILLED_SYRINGE | INTRAMUSCULAR | Status: AC
  Filled 2015-05-31: qty 0.3

## 2015-05-31 MED ORDER — INFLUENZA VAC SPLIT QUAD 0.5 ML IM SUSY
PREFILLED_SYRINGE | INTRAMUSCULAR | Status: AC
  Filled 2015-05-31: qty 0.5

## 2015-05-31 MED ORDER — FLUDEOXYGLUCOSE F - 18 (FDG) INJECTION
16.6000 | Freq: Once | INTRAVENOUS | Status: DC | PRN
  Administered 2015-05-31: 16.6 via INTRAVENOUS
  Filled 2015-05-31: qty 16.6

## 2015-06-07 ENCOUNTER — Other Ambulatory Visit (HOSPITAL_COMMUNITY): Payer: Self-pay | Admitting: Hematology & Oncology

## 2015-06-07 ENCOUNTER — Ambulatory Visit (HOSPITAL_COMMUNITY)
Admission: RE | Admit: 2015-06-07 | Discharge: 2015-06-07 | Disposition: A | Discharge status: Home / Self Care | Payer: Medicare HMO | Source: Ambulatory Visit | Attending: Hematology & Oncology | Admitting: Hematology & Oncology

## 2015-06-07 DIAGNOSIS — C8580 Other specified types of non-Hodgkin lymphoma, unspecified site: Secondary | ICD-10-CM | POA: Insufficient documentation

## 2015-06-07 DIAGNOSIS — Z09 Encounter for follow-up examination after completed treatment for conditions other than malignant neoplasm: Secondary | ICD-10-CM | POA: Diagnosis present

## 2015-06-07 DIAGNOSIS — E119 Type 2 diabetes mellitus without complications: Secondary | ICD-10-CM | POA: Insufficient documentation

## 2015-06-07 DIAGNOSIS — R161 Splenomegaly, not elsewhere classified: Secondary | ICD-10-CM | POA: Insufficient documentation

## 2015-06-07 DIAGNOSIS — I10 Essential (primary) hypertension: Secondary | ICD-10-CM | POA: Insufficient documentation

## 2015-06-07 LAB — POCT I-STAT CREATININE: Creatinine, Ser: 2.6 mg/dL — ABNORMAL HIGH (ref 0.61–1.24)

## 2015-06-11 ENCOUNTER — Encounter (HOSPITAL_COMMUNITY): Payer: Medicare HMO | Attending: Hematology & Oncology | Admitting: Hematology & Oncology

## 2015-06-11 VITALS — BP 126/77 | HR 62 | Temp 98.2°F | Resp 20 | Wt 316.7 lb

## 2015-06-11 DIAGNOSIS — D696 Thrombocytopenia, unspecified: Secondary | ICD-10-CM

## 2015-06-11 DIAGNOSIS — N183 Chronic kidney disease, stage 3 (moderate): Secondary | ICD-10-CM | POA: Diagnosis not present

## 2015-06-11 DIAGNOSIS — C8586 Other specified types of non-Hodgkin lymphoma, intrapelvic lymph nodes: Secondary | ICD-10-CM | POA: Diagnosis not present

## 2015-06-11 DIAGNOSIS — N184 Chronic kidney disease, stage 4 (severe): Secondary | ICD-10-CM

## 2015-06-11 DIAGNOSIS — C8580 Other specified types of non-Hodgkin lymphoma, unspecified site: Secondary | ICD-10-CM | POA: Insufficient documentation

## 2015-06-11 NOTE — Progress Notes (Signed)
[name] PROGRESS NOTE  Patient Care Team: Stephens Shire, MD as PCP - General (Family Medicine)  CHIEF COMPLAINTS/PURPOSE OF CONSULTATION:  Marginal Zone Lymphoma EGD 09/03/2014 negative for intestinal metaplasia, dysplasia or malignancy H pylori negative Colonoscopy on 09/03/2014 with 6 polyps (tubular adenomas), redundant left colon  Marginal zone lymphoma   Staging form: Lymphoid Neoplasms, AJCC 6th Edition     Clinical stage from 11/07/2014: Stage II - Unsigned   HISTORY OF PRESENTING ILLNESS:  [name] [name] 69 y.o. male is here because of Stage II marginal zone lymphoma.  He was admitted to [facility] in December 2015 with abdominal pain. The pain was described as severe. He also reported episodes of vomiting. At presentation he had elevated liver function tests and bilirubin, CT scan of the abdomen and pelvis showed a possible bladder mass abutting the colon.  He underwent a colonoscopy on 09/03/2014 with Dr. Oneida Alar. No mass was noted on exam. 6 polyps were removed all without dysplasia on final pathology, and a redundant left colon was noted. He ultimately underwent a CT-guided biopsy of the pelvic mass on February 25, with final pathology consistent with a low-grade non-Hodgkin's lymphoma, marginal zone type.  Mr. Mahaney is here to discuss the results of his most recent scans. He is here today with his daughter. He has been eating well. He notes that he feels well, energy is baseline and appetite is good.   He denies abdominal pain, nausea or diarrhea. No night sweats or fever.     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[place])   07/23/2014 Imaging CT C/A/P with soft tissue mass in pelvis, LN adjacent to distal esophagus, upper abdomen a partially calcified soft tissue mass   09/27/2014 Initial Biopsy CT guided biopsy   10/17/2014 PET scan Hypermetabolic mass left adjacent to the bladder. The mass is mild to moderate in metabolic activity consistent with low-grade lymphoma. Mild left  external iliac and common iliac metabolic adenopathy.Single retroperitoneal node anterior to L renal vein   10/31/2014 - 11/21/2014 Chemotherapy Single Agent Rituxan weekly x 4     MEDICAL HISTORY:  Past Medical History  Diagnosis Date  . Peripheral vascular disease (HCC)     neuropathy in toes   . Shortness of breath     due ot pain in knees   . Sleep apnea     sleep study 3/29 13 ? location   . Pneumonia     hx of   . Diabetes mellitus   . Hypothyroidism   . Chronic kidney disease     stage III kidney disease   . S/P biopsy     SURGICAL HISTORY: Past Surgical History  Procedure Laterality Date  . Cholecystectomy  1992  . Total knee arthroplasty  11/10/2011    Procedure: TOTAL KNEE ARTHROPLASTY;  Surgeon: Mauri Pole, MD;  Location: [facility];  Service: Orthopedics;  Laterality: Right;  . Colonoscopy N/A 09/03/2014    SLF:six colon polyps removed/small internal hemorrhoids  . Esophagogastroduodenoscopy N/A 09/03/2014    SLF: mild gastritis/few gastric polyps    SOCIAL HISTORY: Social History   Social History  . Marital Status: Divorced    Spouse Name: N/A  . Number of Children: N/A  . Years of Education: N/A   Occupational History  . Not on file.   Social History Main Topics  . Smoking status: Former Smoker -- 1.00 packs/day for 30 years    Quit date: 08/04/2007  . Smokeless tobacco: Never Used  . Alcohol  Use: No  . Drug Use: No  . Sexual Activity: Not on file   Other Topics Concern  . Not on file   Social History Narrative    FAMILY HISTORY: Family History  Problem Relation Age of Onset  . Colon cancer Neg Hx    has no family status information on file.   ALLERGIES:  has No Known Allergies.  MEDICATIONS:  Current Outpatient Prescriptions  Medication Sig Dispense Refill  . aspirin 81 MG tablet Take 81 mg by mouth daily.    . Insulin Glargine (LANTUS SOLOSTAR) 100 UNIT/ML Solostar Pen Inject 52 Units into the skin every morning. Pt takes in the AM      . insulin lispro (HUMALOG KWIKPEN) 100 UNIT/ML KiwkPen Inject 3-7 Units into the skin 3 (three) times daily as needed (FOR BLOOD SUGARS).     Marland Kitchen levothyroxine (SYNTHROID, LEVOTHROID) 300 MCG tablet Take 300 mcg by mouth every morning.    Marland Kitchen losartan (COZAAR) 100 MG tablet Take 100 mg by mouth every morning.    . triamcinolone (NASACORT) 55 MCG/ACT AERO nasal inhaler Place 2 sprays into the nose daily as needed (allergies).    Marland Kitchen UNABLE TO FIND Apply 1 application topically as needed. Diabetic skin relief foot cream    . verapamil (CALAN-SR) 180 MG CR tablet Take 180 mg by mouth daily.     No current facility-administered medications for this visit.    Review of Systems  Constitutional: Negative for fever, chills, weight loss and malaise/fatigue.  HENT: Negative for congestion, hearing loss, nosebleeds, sore throat and tinnitus.   Eyes: Negative for blurred vision, double vision, pain and discharge.  Respiratory: Negative for cough, hemoptysis, sputum production, shortness of breath and wheezing.   Cardiovascular: Negative for chest pain, palpitations, claudication, leg swelling and PND.  Gastrointestinal: Negative for heartburn, nausea, vomiting, abdominal pain, diarrhea, constipation, blood in stool and melena.  Genitourinary: Negative for dysuria, urgency, frequency and hematuria.  Musculoskeletal: Negative for myalgias, joint pain and falls.  Skin: Negative for itching and rash.  Neurological: Negative for dizziness, tingling, tremors, sensory change, speech change, focal weakness, seizures, loss of consciousness, weakness and headaches.  Endo/Heme/Allergies: Does not bruise/bleed easily.  Psychiatric/Behavioral: Negative for depression, suicidal ideas, memory loss and substance abuse. The patient is not nervous/anxious and does not have insomnia.   14 point review of systems was performed and is negative except as detailed under history of present illness and above  PHYSICAL  EXAMINATION: ECOG PERFORMANCE STATUS: 0 - Asymptomatic  Filed Vitals:   06/11/15 0849  BP: 126/77  Pulse: 62  Temp: 98.2 F (36.8 C)  Resp: 20   Filed Weights   06/11/15 0849  Weight: 316 lb 11.2 oz (143.654 kg)   Physical Exam  Constitutional: He is oriented to person, place, and time and well-developed, well-nourished, and in no distress.  Obese  HENT:  Head: Normocephalic and atraumatic.  Nose: Nose normal.  Mouth/Throat: Oropharynx is clear and moist. No oropharyngeal exudate.  Eyes: Conjunctivae and EOM are normal. Pupils are equal, round, and reactive to light. Right eye exhibits no discharge. Left eye exhibits no discharge. No scleral icterus.  Neck: Normal range of motion. Neck supple. No tracheal deviation present. No thyromegaly present.  Cardiovascular: Normal rate, regular rhythm and normal heart sounds.  Exam reveals no gallop and no friction rub.  No murmur heard. Pulmonary/Chest: Effort normal and breath sounds normal. He has no wheezes. He has no rales.  Abdominal: Soft. Bowel sounds are normal. He  exhibits no distension and no mass. There is no tenderness. There is no rebound and no guarding.  Musculoskeletal: Normal range of motion. He exhibits no edema.  Chronic LE skin/vascular changes, improvement noted.  Lymphadenopathy:    He has no cervical adenopathy.  Neurological: He is alert and oriented to person, place, and time. He has normal reflexes. No cranial nerve deficit. Gait normal. Coordination normal.  Skin: Skin is warm and dry. No rash noted.  Psychiatric: Mood, memory, affect and judgment normal.  Nursing note and vitals reviewed.   LABORATORY DATA:  I have reviewed the data as listed Lab Results  Component Value Date   WBC 5.6 05/28/2015   HGB 15.6 05/28/2015   HCT 45.2 05/28/2015   MCV 88.6 05/28/2015   PLT 109* 05/28/2015     Chemistry      Component Value Date/Time   NA 139 05/28/2015 0915   K 3.9 05/28/2015 0915   CL 106 05/28/2015  0915   CO2 25 05/28/2015 0915   BUN 25* 05/28/2015 0915   CREATININE 2.60* 06/07/2015 1042      Component Value Date/Time   CALCIUM 9.3 05/28/2015 0915   ALKPHOS 96 05/28/2015 0915   AST 22 05/28/2015 0915   ALT 21 05/28/2015 0915   BILITOT 0.7 05/28/2015 0915     Soft tissue mass, biopsy, adjacent to urinary bladder - ATYPICAL LYMPHOID PROLIFERATION CONSISTENT WITH NON-HODGKIN'S B-CELL LYMPHOMA. - SEE ONCOLOGY TABLE. Histologic type: Non-Hodgkin's lymphoma, see comment. Grade (if applicable): Low grade. Flow cytometry: No tissue is available for analysis since the specimen was received in formalin. Immunohistochemical stains: BCL-6, CD3, CD5, CD10, CD20, CD21, CD34, CD43, CD79a, CD138, Cyclin D-1, kappa, lambda, TdT, and Ki-67 with appropriate controls. Touch preps/imprints: Not performed. Comments: The sections show needle core biopsy of soft tissue displaying a very dense and relatively monomorphic infiltrate of small lymphoid cells with high nuclear cytoplasmic ratio, round to irregular nuclei, dense chromatin, and small to inconspicuous nucleoli. The appearance is diffuse with lack of obvious follicular structures or proliferation centers. No necrosis or conspicuous mitosis is identified. To further evaluate this process, immunohistochemical stains were performed and show that the overwhelming majority of lymphocytes consist of B-cells, as highlighted with CD20 and CD79a. B-lymphocytes show no significant staining with BCL-6, CD10, CD5, Cyclin D-1, CD34, or TdT. CD21 highlights scattered small foci of dendritic networks throughout the core biopsies. CD138 highlights a minor plasma cell component, which consist of scattered cells and variably sized but predominantly small clusters. Kappa and lambda stains failed to show any significant staining and are considered non-contributory. Ki-67 shows very low expression (less than 5%). There is an admixed minor T-cell component present as  seen with CD3, CD5, and CD43. The overall features are consistent with low grade non-Hodgkin's B-cell lymphoma and the overall phenotypic features favor marginal zone type. Clinical correlation is strongly recommended. (BNS:ds 10/01/14)   RADIOGRAPHIC STUDIES: I have personally reviewed the radiological images as listed and agreed with the findings in the report.  Ct Abdomen Pelvis Wo Contrast  06/07/2015  CLINICAL DATA:  Restaging non-Hodgkin's lymphoma. History of diabetes and hypertension. Renal failure. Subsequent encounter. EXAM: CT ABDOMEN AND PELVIS WITHOUT CONTRAST TECHNIQUE: Multidetector CT imaging of the abdomen and pelvis was performed following the standard protocol without IV contrast. COMPARISON:  PET-CT 05/31/2015.  Abdominal pelvic CT 07/23/2014. FINDINGS: Lower chest: Clear lung bases. No significant pleural or pericardial effusion. Right infrahilar nodal tissue appears unchanged. Hepatobiliary: The liver demonstrates diffusely decreased density consistent with  steatosis. As evaluated in the noncontrast state, the liver appears unchanged without apparent focal abnormality. No biliary dilatation status post cholecystectomy. Pancreas: Unremarkable. No pancreatic ductal dilatation or surrounding inflammatory changes. Spleen: Stable mild splenomegaly. There is a small splenule at the hilum which is unchanged. Adrenals/Urinary Tract: Both adrenal glands appear normal. Both kidneys are atrophied with cortical thinning. No mass lesion, hydronephrosis or urinary tract calculus demonstrated. A soft tissue mass draping over the superior left aspect of the bladder is again noted, measuring approximately 7.4 x 5.3 cm transverse on image 75, similar to PET-CT. This has slightly improved compared with the prior abdominal CT. Stomach/Bowel: No evidence of bowel wall thickening, distention or surrounding inflammatory change. There is moderate stool throughout the colon. Vascular/Lymphatic: Retroperitoneal  and left pelvic adenopathy is similar to the most recent study. There is a 14 mm node anterior to the left renal vein on image 33, a 10 mm left common iliac node on image 58 and a 10 mm left external iliac node on image 72. No progressive adenopathy demonstrated. Mild aortoiliac atherosclerosis appears unchanged. Reproductive: Unremarkable. Other: The partially calcified superior omental mass inferior to the xiphoid process is unchanged, measuring up to 3.1 cm on coronal image 14. No ascites or peritoneal nodularity. Mildly prominent fat in the inguinal canals is stable. Musculoskeletal: No acute or significant osseous findings. Stable mild lumbar spondylosis. IMPRESSION: 1. Stable mild splenomegaly and stable small retroperitoneal and left pelvic lymph nodes. 2. No evidence of disease progression. 3. Stable pelvic mass involving the left superior aspect of the bladder. No evidence of hydronephrosis. Electronically Signed   By: Richardean Sale M.D.   On: 06/07/2015 13:16   Nm Pet Image Restag (ps) Skull Base To Thigh  05/31/2015  CLINICAL DATA:  Subsequent treatment strategy for non-Hodgkin's (marginal zone) lymphoma on Rituxan. EXAM: NUCLEAR MEDICINE PET SKULL BASE TO THIGH TECHNIQUE: 16.6 mCi F-18 FDG was injected intravenously. Full-ring PET imaging was performed from the skull base to thigh after the radiotracer. CT data was obtained and used for attenuation correction and anatomic localization. FASTING BLOOD GLUCOSE:  Value: 97 mg/dl COMPARISON:  10/17/2014 PET-CT. FINDINGS: NECK Significant head/neck motion between the PET and CT portions of the study limits evaluation of the head and neck. No appreciable hypermetabolic lymph nodes in the neck. CHEST No hypermetabolic axillary, mediastinal or hilar nodes. There is atherosclerosis of the thoracic aorta, the great vessels of the mediastinum and the coronary arteries, including calcified atherosclerotic plaque in the left anterior descending, left circumflex  and right coronary arteries. Stable 3 mm solid right upper lobe pulmonary nodule (series 8/ image 16), below PET resolution. No acute consolidative airspace disease or new significant pulmonary nodules. ABDOMEN/PELVIS Stable mild to moderate splenomegaly (craniocaudal splenic length 16.4 cm) without splenic hypermetabolism. There is a mildly hypermetabolic 7.7 x 4.9 cm anterior left pelvic mass draping over the left bladder wall (series 4/image 191) with max SUV 5.2, previously 7.7 x 5.4 cm with max SUV 4.4, not appreciably changed in size, with minimally increased FDG uptake. There is a mildly enlarged mildly hypermetabolic 1.4 cm left upper retroperitoneal node just anterior to the left renal vein (4/130) with max SUV 4.9, previously 1.4 cm with max SUV 4.8, not appreciably changed. There is a non hypermetabolic mildly enlarged 1.2 cm left common iliac node (4/170) with max SUV 2.5, previously 1.4 cm with max SUV 3.3, decreased in size and uptake. A 0.7 cm left external iliac node (4/183) demonstrates max SUV 3.4, unchanged in  size and metabolism. No new hypermetabolic lymph nodes in the abdomen or pelvis. No abnormal hypermetabolic activity within the liver, pancreas or adrenal glands. Status post cholecystectomy. Stable mild prostatomegaly. There is a 3.9 cm partially calcified complex fluid density structure in the midline far upper anterior abdomen, just below the xiphoid process, which demonstrates mild hypermetabolism with max SUV 4.3, stable in size with previous max SUV 3.1. Stable small left greater than right fat containing bilateral inguinal hernias. SKELETON No focal hypermetabolic activity to suggest skeletal metastasis. IMPRESSION: 1. Slight metabolic progression of 7.7 cm anterior pelvic mass draping over the left bladder wall. 2. Stable mildly hypermetabolic left upper retroperitoneal and left external iliac lymphadenopathy. 3. Metabolic response of the left common iliac lymphadenopathy, which is  now non-hypermetabolic. 4. Overall, the metabolic response is mixed. No new sites of hypermetabolic disease. 5. Stable size of partially calcified mildly hypermetabolic 3.9 cm complex fluid density structure in the midline anterior far upper abdomen just below the xiphoid process, indeterminate, possibly post-traumautic. 6. Stable subcentimeter right upper lobe pulmonary nodule, below PET resolution. 7. Atherosclerosis, including three-vessel coronary artery disease. Please note that although the presence of coronary artery calcium documents the presence of coronary artery disease, the severity of this disease and any potential stenosis cannot be assessed on this non-gated CT examination. Electronically Signed   By: Ilona Sorrel M.D.   On: 05/31/2015 10:18     ASSESSMENT & PLAN:   Stage II marginal zone lymphoma CKD Mild Thrombocytopenia  He has done well  with single agent Rituxan. His disease is indolent, but based upon the location of the pelvic tumor (ie. Between the bladder and rectum) we opted to proceed with single agent Rituxan. He is currently on Q 2 month Rituxan.   We discussed his most recent PET scan findings. I have advised him that his disease is stable to improved. In regards to the pelvic mass, it is relatively unchanged.  I have recommended a discussion with Rad Onc, based upon the location of the tumor he may benefit from XRT.  He understands to contact us if he begins to experience pain in his pelvic area, change in bowel or bladder habits.  I am not sure if he has ever seen nephrology, but will discuss referral at his next visit. We will repeat CT scans around March 2016. I will see him again in one month for routine follow-up and ongoing Rituxan therapy.  All questions were answered. The patient knows to call the clinic with any problems, questions or concerns.   This note was electronically signed.   This document serves as a record of services personally performed by  Ancil Linsey, MD. It was created on her behalf by Arlyce Harman, a trained medical scribe. The creation of this record is based on the scribe's personal observations and the provider's statements to them. This document has been checked and approved by the attending provider.  I have reviewed the above documentation for accuracy and completeness, and I agree with the above.   Kelby Fam. Whitney Muse, MD

## 2015-06-11 NOTE — Patient Instructions (Signed)
[place] at [facility] Discharge Instructions  RECOMMENDATIONS MADE BY THE CONSULTANT AND ANY TEST RESULTS WILL BE SENT TO YOUR REFERRING PHYSICIAN.  Exam per Dr.Penland. Return as scheduled.  Thank you for choosing [place] at [facility] to provide your oncology and hematology care.  To afford each patient quality time with our provider, please arrive at least 15 minutes before your scheduled appointment time.    You need to re-schedule your appointment should you arrive 10 or more minutes late.  We strive to give you quality time with our providers, and arriving late affects you and other patients whose appointments are after yours.  Also, if you no show three or more times for appointments you may be dismissed from the clinic at the providers discretion.     Again, thank you for choosing [facility].  Our hope is that these requests will decrease the amount of time that you wait before being seen by our physicians.       _____________________________________________________________  Should you have questions after your visit to [facility], please contact our office at [phone] between the hours of 8:30 a.m. and 4:30 p.m.  Voicemails left after 4:30 p.m. will not be returned until the following business day.  For prescription refill requests, have your pharmacy contact our office.

## 2015-06-20 ENCOUNTER — Ambulatory Visit: Payer: Medicare HMO

## 2015-06-25 ENCOUNTER — Encounter: Payer: Self-pay | Admitting: Radiation Oncology

## 2015-06-25 NOTE — Progress Notes (Signed)
GI Location of Tumor / Histology: stage II marginal zone lymphoma  [name] [name] presented December 2015 with symptoms of: severe abdominal pain and vomiting.  Biopsies of soft tissue mass adjacent to urinary bladder (if applicable) revealed:    Past/Anticipated interventions by surgeon, if any: CT guided biopsy of pelvic mass   Past/Anticipated interventions by medical oncology, if any: He is currently on every two month Rituxan  Weight changes, if any: no  Bowel/Bladder complaints, if any: no  Nausea / Vomiting, if any: no  Pain issues, if any:  no  Any blood per rectum:   no  SAFETY ISSUES:  Prior radiation? no  Pacemaker/ICD? no  Possible current pregnancy? no  Is the patient on methotrexate? no

## 2015-07-01 ENCOUNTER — Ambulatory Visit: Admission: RE | Admit: 2015-07-01 | Payer: Medicare HMO | Source: Ambulatory Visit | Admitting: Radiation Oncology

## 2015-07-01 ENCOUNTER — Encounter: Payer: Self-pay | Admitting: Radiation Oncology

## 2015-07-01 ENCOUNTER — Telehealth: Payer: Self-pay | Admitting: Radiation Oncology

## 2015-07-01 ENCOUNTER — Ambulatory Visit: Payer: Medicare HMO

## 2015-07-01 NOTE — Progress Notes (Signed)
  Radiation Oncology         [phone] ________________________________  Name: [name] [name]  MRN: [id number]  Date: 07/01/2015  DOB: [birth year]  Chart Note:  I received a message from Dr. Whitney Muse regarding this patient's lymphoma involving the superior bladder.  In reviewing his chart and imaging, he appears to have stage II extranodal marginal zone lymphoma.  In this setting, I think localized radiotherapy (ISRT) to the tumor to 24-30 Gy would be an option with limited morbidity.  After receiving the message and reviewing the films, I advised my staff to set up a visit with me.  However, the patient spoke with Dr. Whitney Muse, and since there is no urgency for possible radiation therapy at this time and he is tolerating rituximab very well, the patient cancelled the appointment today.  I think that is very reasonable, and would be more than happy to see him in the future if clinically indicated.  ________________________________  Sheral Apley. Tammi Klippel, M.D.

## 2015-07-01 NOTE — Telephone Encounter (Signed)
Spoke with patient. Confirmed appointment isn't needed for today. Patient understands to contact our staff with future needs.

## 2015-07-22 ENCOUNTER — Encounter (HOSPITAL_COMMUNITY): Payer: Self-pay | Admitting: Hematology & Oncology

## 2015-07-22 ENCOUNTER — Encounter (HOSPITAL_COMMUNITY): Payer: Medicare HMO | Attending: Hematology & Oncology

## 2015-07-22 ENCOUNTER — Encounter (HOSPITAL_BASED_OUTPATIENT_CLINIC_OR_DEPARTMENT_OTHER): Payer: Medicare HMO | Admitting: Hematology & Oncology

## 2015-07-22 VITALS — BP 132/80 | HR 72 | Temp 97.7°F | Resp 20 | Wt 317.8 lb

## 2015-07-22 VITALS — BP 123/74 | HR 60 | Temp 97.8°F | Resp 18

## 2015-07-22 DIAGNOSIS — N189 Chronic kidney disease, unspecified: Secondary | ICD-10-CM | POA: Diagnosis not present

## 2015-07-22 DIAGNOSIS — C8580 Other specified types of non-Hodgkin lymphoma, unspecified site: Secondary | ICD-10-CM

## 2015-07-22 DIAGNOSIS — Z5112 Encounter for antineoplastic immunotherapy: Secondary | ICD-10-CM

## 2015-07-22 DIAGNOSIS — E119 Type 2 diabetes mellitus without complications: Secondary | ICD-10-CM

## 2015-07-22 DIAGNOSIS — D696 Thrombocytopenia, unspecified: Secondary | ICD-10-CM | POA: Diagnosis not present

## 2015-07-22 LAB — CBC WITH DIFFERENTIAL/PLATELET
BASOS ABS: 0.1 10*3/uL (ref 0.0–0.1)
BASOS PCT: 1 %
Eosinophils Absolute: 0.4 10*3/uL (ref 0.0–0.7)
Eosinophils Relative: 4 %
HCT: 49 % (ref 39.0–52.0)
Hemoglobin: 17.4 g/dL — ABNORMAL HIGH (ref 13.0–17.0)
Lymphocytes Relative: 24 %
Lymphs Abs: 2.6 10*3/uL (ref 0.7–4.0)
MCH: 31.4 pg (ref 26.0–34.0)
MCHC: 35.5 g/dL (ref 30.0–36.0)
MCV: 88.3 fL (ref 78.0–100.0)
MONO ABS: 0.9 10*3/uL (ref 0.1–1.0)
Monocytes Relative: 9 %
Neutro Abs: 6.7 10*3/uL (ref 1.7–7.7)
Neutrophils Relative %: 62 %
PLATELETS: 180 10*3/uL (ref 150–400)
RBC: 5.55 MIL/uL (ref 4.22–5.81)
RDW: 14.7 % (ref 11.5–15.5)
WBC: 10.7 10*3/uL — ABNORMAL HIGH (ref 4.0–10.5)

## 2015-07-22 LAB — COMPREHENSIVE METABOLIC PANEL
ALBUMIN: 4.3 g/dL (ref 3.5–5.0)
ALT: 11 U/L — ABNORMAL LOW (ref 17–63)
ANION GAP: 11 (ref 5–15)
AST: 15 U/L (ref 15–41)
Alkaline Phosphatase: 102 U/L (ref 38–126)
BUN: 43 mg/dL — AB (ref 6–20)
CHLORIDE: 94 mmol/L — AB (ref 101–111)
CO2: 26 mmol/L (ref 22–32)
Calcium: 9.9 mg/dL (ref 8.9–10.3)
Creatinine, Ser: 2.7 mg/dL — ABNORMAL HIGH (ref 0.61–1.24)
GFR calc Af Amer: 26 mL/min — ABNORMAL LOW (ref >60)
GFR, EST NON AFRICAN AMERICAN: 23 mL/min — AB (ref >60)
GLUCOSE: 544 mg/dL — AB (ref 65–99)
POTASSIUM: 3.8 mmol/L (ref 3.5–5.1)
Sodium: 131 mmol/L — ABNORMAL LOW (ref 135–145)
Total Bilirubin: 1.2 mg/dL (ref 0.3–1.2)
Total Protein: 7.9 g/dL (ref 6.5–8.1)

## 2015-07-22 LAB — LACTATE DEHYDROGENASE: LDH: 176 U/L (ref 98–192)

## 2015-07-22 MED ORDER — SODIUM CHLORIDE 0.9 % IV SOLN
Freq: Once | INTRAVENOUS | Status: AC
  Administered 2015-07-22: 09:00:00 via INTRAVENOUS

## 2015-07-22 MED ORDER — SODIUM CHLORIDE 0.9 % IV SOLN
375.0000 mg/m2 | Freq: Once | INTRAVENOUS | Status: AC
  Administered 2015-07-22: 1000 mg via INTRAVENOUS
  Filled 2015-07-22: qty 100

## 2015-07-22 MED ORDER — DIPHENHYDRAMINE HCL 25 MG PO CAPS
ORAL_CAPSULE | ORAL | Status: AC
  Filled 2015-07-22: qty 2

## 2015-07-22 MED ORDER — ACETAMINOPHEN 325 MG PO TABS
650.0000 mg | ORAL_TABLET | Freq: Once | ORAL | Status: AC
  Administered 2015-07-22: 650 mg via ORAL

## 2015-07-22 MED ORDER — SODIUM CHLORIDE 0.9 % IJ SOLN
10.0000 mL | INTRAMUSCULAR | Status: DC | PRN
  Administered 2015-07-22: 10 mL
  Filled 2015-07-22: qty 10

## 2015-07-22 MED ORDER — DIPHENHYDRAMINE HCL 25 MG PO CAPS
50.0000 mg | ORAL_CAPSULE | Freq: Once | ORAL | Status: AC
  Administered 2015-07-22: 50 mg via ORAL

## 2015-07-22 MED ORDER — ACETAMINOPHEN 325 MG PO TABS
ORAL_TABLET | ORAL | Status: AC
  Filled 2015-07-22: qty 2

## 2015-07-22 NOTE — Progress Notes (Signed)
[place] PROGRESS NOTE  Patient Care Team: Stephens Shire, MD as PCP - General (Family Medicine)  CHIEF COMPLAINTS/PURPOSE OF CONSULTATION:  Marginal Zone Lymphoma EGD 09/03/2014 negative for intestinal metaplasia, dysplasia or malignancy H pylori negative Colonoscopy on 09/03/2014 with 6 polyps (tubular adenomas), redundant left colon  Marginal zone lymphoma   Staging form: Lymphoid Neoplasms, AJCC 6th Edition     Clinical stage from 11/07/2014: Stage II - Unsigned   HISTORY OF PRESENTING ILLNESS:  [name] [name] 69 y.o. male is here because of Stage II marginal zone lymphoma.  He was admitted to [facility] in December 2015 with abdominal pain. The pain was described as severe. He also reported episodes of vomiting. At presentation he had elevated liver function tests and bilirubin, CT scan of the abdomen and pelvis showed a possible bladder mass abutting the colon.  He underwent a colonoscopy on 09/03/2014 with Dr. Oneida Alar. No mass was noted on exam. 6 polyps were removed all without dysplasia on final pathology, and a redundant left colon was noted. He ultimately underwent a CT-guided biopsy of the pelvic mass on February 25, with final pathology consistent with a low-grade non-Hodgkin's lymphoma, marginal zone type.  [name] [name] returns to [facility] today. He goes by the name of "[name] [name]."  He says he feels well today. He had a good Thanksgiving and he is planning on a similar Christmas.  When the possibility of radiation is discussed for his pelvic pass, he confirms that he would "rather have it gone." He is agreeable to proceeding.  He confirms that he has had his flu shot this year.  [name] [name] denies any belly pain, confirms that his feet feel the same and denies numbness there. In terms of his leg swelling, he looks a lot better. The skin on his legs appears dry. He confirms that he uses two or three different creams, one purposefully for diabetics, one  supplied by his girlfriend.  He has proposed marriage "down the line" to his girlfriend. He says she treats him better than any other woman in his life. He is without other complaints today.      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[place])   07/23/2014 Imaging CT C/A/P with soft tissue mass in pelvis, LN adjacent to distal esophagus, upper abdomen a partially calcified soft tissue mass   09/27/2014 Initial Biopsy CT guided biopsy   10/17/2014 PET scan Hypermetabolic mass left adjacent to the bladder. The mass is mild to moderate in metabolic activity consistent with low-grade lymphoma. Mild left external iliac and common iliac metabolic adenopathy.Single retroperitoneal node anterior to L renal vein   10/31/2014 - 11/21/2014 Chemotherapy Single Agent Rituxan weekly x 4     MEDICAL HISTORY:  Past Medical History  Diagnosis Date  . Peripheral vascular disease (HCC)     neuropathy in toes   . Shortness of breath     due ot pain in knees   . Sleep apnea     sleep study 3/29 13 ? location   . Pneumonia     hx of   . Diabetes mellitus   . Hypothyroidism   . Chronic kidney disease     stage III kidney disease   . S/P biopsy   . Lymphoma ([place])     SURGICAL HISTORY: Past Surgical History  Procedure Laterality Date  . Cholecystectomy  1992  . Total knee arthroplasty  11/10/2011    Procedure: TOTAL KNEE ARTHROPLASTY;  Surgeon: Mauri Pole, MD;  Location: [facility];  Service: Orthopedics;  Laterality: Right;  . Colonoscopy N/A 09/03/2014    SLF:six colon polyps removed/small internal hemorrhoids  . Esophagogastroduodenoscopy N/A 09/03/2014    SLF: mild gastritis/few gastric polyps    SOCIAL HISTORY: Social History   Social History  . Marital Status: Divorced    Spouse Name: N/A  . Number of Children: N/A  . Years of Education: N/A   Occupational History  . Not on file.   Social History Main Topics  . Smoking status: Former Smoker -- 1.00 packs/day for 30 years    Quit date: 08/04/2007  .  Smokeless tobacco: Never Used  . Alcohol Use: No  . Drug Use: No  . Sexual Activity: Not on file   Other Topics Concern  . Not on file   Social History Narrative    FAMILY HISTORY: Family History  Problem Relation Age of Onset  . Colon cancer Neg Hx    has no family status information on file.   ALLERGIES:  has No Known Allergies.  MEDICATIONS:  Current Outpatient Prescriptions  Medication Sig Dispense Refill  . aspirin 81 MG tablet Take 81 mg by mouth daily.    . Insulin Glargine (LANTUS SOLOSTAR) 100 UNIT/ML Solostar Pen Inject 52 Units into the skin every morning. Pt takes in the AM    . insulin lispro (HUMALOG KWIKPEN) 100 UNIT/ML KiwkPen Inject 3-7 Units into the skin 3 (three) times daily as needed (FOR BLOOD SUGARS).     Marland Kitchen levothyroxine (SYNTHROID, LEVOTHROID) 300 MCG tablet Take 300 mcg by mouth every morning.    Marland Kitchen losartan (COZAAR) 100 MG tablet Take 100 mg by mouth every morning.    . triamcinolone (NASACORT) 55 MCG/ACT AERO nasal inhaler Place 2 sprays into the nose daily as needed (allergies).    Marland Kitchen UNABLE TO FIND Apply 1 application topically as needed. Diabetic skin relief foot cream    . verapamil (CALAN-SR) 180 MG CR tablet Take 180 mg by mouth daily.     No current facility-administered medications for this visit.   Facility-Administered Medications Ordered in Other Visits  Medication Dose Route Frequency Provider Last Rate Last Dose  . sodium chloride 0.9 % injection 10 mL  10 mL Intracatheter PRN Patrici Ranks, MD   10 mL at 07/22/15 0830    Review of Systems  Constitutional: Negative for fever, chills, weight loss and malaise/fatigue.  HENT: Negative for congestion, hearing loss, nosebleeds, sore throat and tinnitus.   Eyes: Negative for blurred vision, double vision, pain and discharge.  Respiratory: Negative for cough, hemoptysis, sputum production, shortness of breath and wheezing.   Cardiovascular: Negative for chest pain, palpitations,  claudication, leg swelling and PND.  Gastrointestinal: Negative for heartburn, nausea, vomiting, abdominal pain, diarrhea, constipation, blood in stool and melena.  Genitourinary: Negative for dysuria, urgency, frequency and hematuria.  Musculoskeletal: Negative for myalgias, joint pain and falls.  Skin: Negative for itching and rash.  Neurological: Negative for dizziness, tingling, tremors, sensory change, speech change, focal weakness, seizures, loss of consciousness, weakness and headaches.  Endo/Heme/Allergies: Does not bruise/bleed easily.  Psychiatric/Behavioral: Negative for depression, suicidal ideas, memory loss and substance abuse. The patient is not nervous/anxious and does not have insomnia.   14 point review of systems was performed and is negative except as detailed under history of present illness and above   PHYSICAL EXAMINATION: ECOG PERFORMANCE STATUS: 0 - Asymptomatic  Filed Vitals:   07/22/15 0834  BP: 132/80  Pulse: 72  Temp: 97.7  F (36.5 C)  Resp: 20   Filed Weights   07/22/15 0834  Weight: 317 lb 12.8 oz (144.153 kg)   Physical Exam  Constitutional: He is oriented to person, place, and time and well-developed, well-nourished, and in no distress.  Obese  HENT:  Head: Normocephalic and atraumatic.  Nose: Nose normal.  Mouth/Throat: Oropharynx is clear and moist. No oropharyngeal exudate.  Eyes: Conjunctivae and EOM are normal. Pupils are equal, round, and reactive to light. Right eye exhibits no discharge. Left eye exhibits no discharge. No scleral icterus.  Neck: Normal range of motion. Neck supple. No tracheal deviation present. No thyromegaly present.  Cardiovascular: Normal rate, regular rhythm and normal heart sounds.  Exam reveals no gallop and no friction rub.  No murmur heard. Pulmonary/Chest: Effort normal and breath sounds normal. He has no wheezes. He has no rales.  Abdominal: Soft. Bowel sounds are normal. He exhibits no distension and no mass.  There is no tenderness. There is no rebound and no guarding.  Musculoskeletal: Normal range of motion. He exhibits no edema.  Chronic LE skin/vascular changes, improvement noted.  Lymphadenopathy:    He has no cervical adenopathy.  Neurological: He is alert and oriented to person, place, and time. He has normal reflexes. No cranial nerve deficit. Gait normal. Coordination normal.  Skin: Skin is warm and dry. No rash noted.  Psychiatric: Mood, memory, affect and judgment normal.  Nursing note and vitals reviewed.   LABORATORY DATA:  I have reviewed the data as listed Lab Results  Component Value Date   WBC 10.7* 07/22/2015   HGB 17.4* 07/22/2015   HCT 49.0 07/22/2015   MCV 88.3 07/22/2015   PLT 180 07/22/2015     Chemistry      Component Value Date/Time   NA 131* 07/22/2015 0930   K 3.8 07/22/2015 0930   CL 94* 07/22/2015 0930   CO2 26 07/22/2015 0930   BUN 43* 07/22/2015 0930   CREATININE 2.70* 07/22/2015 0930      Component Value Date/Time   CALCIUM 9.9 07/22/2015 0930   ALKPHOS 102 07/22/2015 0930   AST 15 07/22/2015 0930   ALT 11* 07/22/2015 0930   BILITOT 1.2 07/22/2015 0930     Soft tissue mass, biopsy, adjacent to urinary bladder - ATYPICAL LYMPHOID PROLIFERATION CONSISTENT WITH NON-HODGKIN'S B-CELL LYMPHOMA. - SEE ONCOLOGY TABLE. Histologic type: Non-Hodgkin's lymphoma, see comment. Grade (if applicable): Low grade. Flow cytometry: No tissue is available for analysis since the specimen was received in formalin. Immunohistochemical stains: BCL-6, CD3, CD5, CD10, CD20, CD21, CD34, CD43, CD79a, CD138, Cyclin D-1, kappa, lambda, TdT, and Ki-67 with appropriate controls. Touch preps/imprints: Not performed. Comments: The sections show needle core biopsy of soft tissue displaying a very dense and relatively monomorphic infiltrate of small lymphoid cells with high nuclear cytoplasmic ratio, round to irregular nuclei, dense chromatin, and small to inconspicuous  nucleoli. The appearance is diffuse with lack of obvious follicular structures or proliferation centers. No necrosis or conspicuous mitosis is identified. To further evaluate this process, immunohistochemical stains were performed and show that the overwhelming majority of lymphocytes consist of B-cells, as highlighted with CD20 and CD79a. B-lymphocytes show no significant staining with BCL-6, CD10, CD5, Cyclin D-1, CD34, or TdT. CD21 highlights scattered small foci of dendritic networks throughout the core biopsies. CD138 highlights a minor plasma cell component, which consist of scattered cells and variably sized but predominantly small clusters. Kappa and lambda stains failed to show any significant staining and are considered non-contributory. Ki-67  shows very low expression (less than 5%). There is an admixed minor T-cell component present as seen with CD3, CD5, and CD43. The overall features are consistent with low grade non-Hodgkin's B-cell lymphoma and the overall phenotypic features favor marginal zone type. Clinical correlation is strongly recommended. (BNS:ds 10/01/14)   RADIOGRAPHIC STUDIES: I have personally reviewed the radiological images as listed and agreed with the findings in the report. CLINICAL DATA: Restaging non-Hodgkin's lymphoma. History of diabetes and hypertension. Renal failure. Subsequent encounter.  EXAM: CT ABDOMEN AND PELVIS WITHOUT CONTRAST  TECHNIQUE: Multidetector CT imaging of the abdomen and pelvis was performed following the standard protocol without IV contrast.  COMPARISON: PET-CT 05/31/2015. Abdominal pelvic CT 07/23/2014.  FINDINGS: Lower chest: Clear lung bases. No significant pleural or pericardial effusion. Right infrahilar nodal tissue appears unchanged.  Hepatobiliary: The liver demonstrates diffusely decreased density consistent with steatosis. As evaluated in the noncontrast state, the liver appears unchanged without apparent  focal abnormality. No biliary dilatation status post cholecystectomy.  Pancreas: Unremarkable. No pancreatic ductal dilatation or surrounding inflammatory changes.  Spleen: Stable mild splenomegaly. There is a small splenule at the hilum which is unchanged.  Adrenals/Urinary Tract: Both adrenal glands appear normal. Both kidneys are atrophied with cortical thinning. No mass lesion, hydronephrosis or urinary tract calculus demonstrated. A soft tissue mass draping over the superior left aspect of the bladder is again noted, measuring approximately 7.4 x 5.3 cm transverse on image 75, similar to PET-CT. This has slightly improved compared with the prior abdominal CT.  Stomach/Bowel: No evidence of bowel wall thickening, distention or surrounding inflammatory change. There is moderate stool throughout the colon.  Vascular/Lymphatic: Retroperitoneal and left pelvic adenopathy is similar to the most recent study. There is a 14 mm node anterior to the left renal vein on image 33, a 10 mm left common iliac node on image 58 and a 10 mm left external iliac node on image 72. No progressive adenopathy demonstrated. Mild aortoiliac atherosclerosis appears unchanged.  Reproductive: Unremarkable.  Other: The partially calcified superior omental mass inferior to the xiphoid process is unchanged, measuring up to 3.1 cm on coronal image 14. No ascites or peritoneal nodularity. Mildly prominent fat in the inguinal canals is stable.  Musculoskeletal: No acute or significant osseous findings. Stable mild lumbar spondylosis.  IMPRESSION: 1. Stable mild splenomegaly and stable small retroperitoneal and left pelvic lymph nodes. 2. No evidence of disease progression. 3. Stable pelvic mass involving the left superior aspect of the bladder. No evidence of hydronephrosis.   Electronically Signed  By: Richardean Sale M.D.  On: 06/07/2015 13:16    ASSESSMENT & PLAN:  Stage II  marginal zone lymphoma CKD Mild Thrombocytopenia, intermittent Diabetes  He has done well  with single agent Rituxan. His disease is indolent, but based upon the location of the pelvic tumor (ie. Between the bladder and rectum) we opted to proceed with single agent Rituxan. He is currently on Q 2 month Rituxan.   We discussed his most recent PET scan findings. I have advised him that his disease is stable to improved. In regards to the pelvic mass, it is relatively unchanged.  I have recommended a discussion with Rad Onc, based upon the location of the tumor he may benefit from XRT. He is agreeable today to go to [place] after the New Year and discuss XRT with Dr.Manning.  I have strongly encouraged that he do this.  I will put him down for follow-up in 2 months.  Platelet count will continue to  be followed with observation only for now.   Orders Placed This Encounter  Procedures  . CBC with Differential    Standing Status: Standing     Number of Occurrences: 8     Standing Expiration Date: 07/21/2017  . Comprehensive metabolic panel    Standing Status: Standing     Number of Occurrences: 8     Standing Expiration Date: 07/21/2017  . Lactate dehydrogenase    Standing Status: Standing     Number of Occurrences: 8     Standing Expiration Date: 07/21/2017   All questions were answered. The patient knows to call the clinic with any problems, questions or concerns.   This note was electronically signed.   This document serves as a record of services personally performed by Ancil Linsey, MD. It was created on her behalf by Toni Amend, a trained medical scribe. The creation of this record is based on the scribe's personal observations and the provider's statements to them. This document has been checked and approved by the attending provider.  I have reviewed the above documentation for accuracy and completeness, and I agree with the above.   Kelby Fam. Whitney Muse, MD

## 2015-07-22 NOTE — Progress Notes (Signed)
Tolerated rituxan infusion well. Ambulatory on discharge home to self.

## 2015-07-22 NOTE — Patient Instructions (Signed)
[facility] Discharge Instructions for Patients Receiving Chemotherapy  Today you received the following chemotherapy agents Rituxan.  To help prevent nausea and vomiting after your treatment, we encourage you to take your nausea medication as instructed.  If you develop nausea and vomiting that is not controlled by your nausea medication, call the clinic. If it is after clinic hours your family physician or the after hours number for the clinic or go to the Emergency Department.   BELOW ARE SYMPTOMS THAT SHOULD BE REPORTED IMMEDIATELY:  *FEVER GREATER THAN 101.0 F  *CHILLS WITH OR WITHOUT FEVER  NAUSEA AND VOMITING THAT IS NOT CONTROLLED WITH YOUR NAUSEA MEDICATION  *UNUSUAL SHORTNESS OF BREATH  *UNUSUAL BRUISING OR BLEEDING  TENDERNESS IN MOUTH AND THROAT WITH OR WITHOUT PRESENCE OF ULCERS  *URINARY PROBLEMS  *BOWEL PROBLEMS  UNUSUAL RASH Items with * indicate a potential emergency and should be followed up as soon as possible.  Return as scheduled.  I have been informed and understand all the instructions given to me. I know to contact the clinic, my physician, or go to the Emergency Department if any problems should occur. I do not have any questions at this time, but understand that I may call the clinic during office hours or the Patient Navigator at [phone] should I have any questions or need assistance in obtaining follow up care.    __________________________________________  _____________  __________ Signature of Patient or Authorized Representative            Date                   Time    __________________________________________ Nurse's Signature  

## 2015-07-22 NOTE — Patient Instructions (Addendum)
[place] at [facility] Discharge Instructions  RECOMMENDATIONS MADE BY THE CONSULTANT AND ANY TEST RESULTS WILL BE SENT TO YOUR REFERRING PHYSICIAN.   Exam completed by Dr Whitney Muse today Rituxan every 2 months Rituxan today as scheduled Lab work today After the holidays, referral to Dr Tammi Klippel for radiation for pelvic mass Return to see the doctor in 2 months Please call the clinic if you have any questions or concerns    Thank you for choosing [place] at [facility] to provide your oncology and hematology care.  To afford each patient quality time with our provider, please arrive at least 15 minutes before your scheduled appointment time.    You need to re-schedule your appointment should you arrive 10 or more minutes late.  We strive to give you quality time with our providers, and arriving late affects you and other patients whose appointments are after yours.  Also, if you no show three or more times for appointments you may be dismissed from the clinic at the providers discretion.     Again, thank you for choosing [facility].  Our hope is that these requests will decrease the amount of time that you wait before being seen by our physicians.       _____________________________________________________________  Should you have questions after your visit to [facility], please contact our office at [phone] between the hours of 8:30 a.m. and 4:30 p.m.  Voicemails left after 4:30 p.m. will not be returned until the following business day.  For prescription refill requests, have your pharmacy contact our office.

## 2015-07-26 ENCOUNTER — Other Ambulatory Visit (HOSPITAL_COMMUNITY): Payer: Self-pay | Admitting: Emergency Medicine

## 2015-07-26 NOTE — Progress Notes (Signed)
Pt called and stated that he was weak for a few days after rituxan this time.  I told him i would send Dr Whitney Muse a message to let her know

## 2015-07-31 ENCOUNTER — Other Ambulatory Visit: Payer: Self-pay | Admitting: Nurse Practitioner

## 2015-08-19 ENCOUNTER — Encounter: Payer: Self-pay | Admitting: Radiation Oncology

## 2015-08-19 ENCOUNTER — Ambulatory Visit
Admission: RE | Admit: 2015-08-19 | Discharge: 2015-08-19 | Disposition: A | Discharge status: Home / Self Care | Payer: Medicare HMO | Source: Ambulatory Visit | Attending: Radiation Oncology | Admitting: Radiation Oncology

## 2015-08-19 VITALS — BP 148/76 | HR 57 | Resp 16 | Ht 72.0 in | Wt 315.9 lb

## 2015-08-19 DIAGNOSIS — C8580 Other specified types of non-Hodgkin lymphoma, unspecified site: Secondary | ICD-10-CM | POA: Diagnosis present

## 2015-08-19 DIAGNOSIS — I739 Peripheral vascular disease, unspecified: Secondary | ICD-10-CM | POA: Insufficient documentation

## 2015-08-19 DIAGNOSIS — N189 Chronic kidney disease, unspecified: Secondary | ICD-10-CM | POA: Diagnosis not present

## 2015-08-19 DIAGNOSIS — N529 Male erectile dysfunction, unspecified: Secondary | ICD-10-CM | POA: Diagnosis present

## 2015-08-19 DIAGNOSIS — E119 Type 2 diabetes mellitus without complications: Secondary | ICD-10-CM | POA: Diagnosis not present

## 2015-08-19 DIAGNOSIS — C8598 Non-Hodgkin lymphoma, unspecified, lymph nodes of multiple sites: Secondary | ICD-10-CM

## 2015-08-19 DIAGNOSIS — Z809 Family history of malignant neoplasm, unspecified: Secondary | ICD-10-CM | POA: Insufficient documentation

## 2015-08-19 DIAGNOSIS — E039 Hypothyroidism, unspecified: Secondary | ICD-10-CM | POA: Diagnosis not present

## 2015-08-19 NOTE — Patient Instructions (Signed)
Contact our office if you have any questions following today's appointment: [phone].  

## 2015-08-19 NOTE — Progress Notes (Signed)
See progress note under physician encounter. 

## 2015-08-19 NOTE — Progress Notes (Signed)
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 A diagnosis of Erectile dysfunction, unspecified erectile dysfunction type was also pertinent to this visit.     ICD-9-CM ICD-10-CM   1. 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HCC) 200.30 C85.80   2. Erectile dysfunction, unspecified erectile dysfunction type 607.84 N52.9     HISTORY OF PRESENT ILLNESS::[name] [name] is a 70 y.o. male with a history of marginal zone lymphoma, seen at the request of Dr. Whitney Muse at [facility]. The patient experienced abdominal pain and vomiting in December 2015. A CT scan during his workup revealed a possible bladder mass abutting the colon for which he subsequently underwent a workup. Colonoscopy in February 2016 revealed benign colon polyps, though he ultimately underwent a CT-guided biopsy of this mass on 09/27/2014 revealing low-grade non-Hodgkin's lymphoma, marginal zone type. He has been treated with single agent Rituxan and his disease is currently indolent, and remains on this infusion every 2 months. His recent PET scan in October 2016 revealed persistent hypermetabolic change within the pelvic mass, and follow-up CT scan on 06/07/2015 revealed stable splenomegaly, small retroperitoneal and left pelvic adenopathy that was stable, no evidence of disease progression, and stable pelvic mass involving the left superior aspect of the bladder. Dr. Tammi Klippel has been consulted previously on this case, and would recommend localized radiotherapy. The patient initially wanted to postpone this until after the holidays and comes today for further recommendations of care with Dr. Tammi Klippel.  PREVIOUS RADIATION THERAPY: No  PAST MEDICAL  HISTORY:  has a past medical history of Peripheral vascular disease ([place]); Shortness of breath; Sleep apnea; Pneumonia; Diabetes mellitus; Hypothyroidism; Chronic kidney disease; S/P biopsy; Lymphoma ([place]); and Cancer ([place]).    PAST SURGICAL HISTORY: Past Surgical History  Procedure Laterality Date  . Cholecystectomy  1992  . Total knee arthroplasty  11/10/2011    Procedure: TOTAL KNEE ARTHROPLASTY;  Surgeon: Mauri Pole, MD;  Location: [facility];  Service: Orthopedics;  Laterality: Right;  . Colonoscopy N/A 09/03/2014    SLF:six colon polyps removed/small internal hemorrhoids  . Esophagogastroduodenoscopy N/A 09/03/2014    SLF: mild gastritis/few gastric polyps    FAMILY HISTORY: family history includes Cancer in his father, maternal uncle, mother, and paternal uncle. There is no history of Colon cancer.  SOCIAL HISTORY:  Social History   Social History  . Marital Status: Divorced    Spouse Name: N/A  . Number of Children: N/A  . Years of Education: N/A   Occupational History  . Not on file.   Social History Main Topics  . Smoking status: Former Smoker -- 1.00 packs/day for 30 years    Types: Cigarettes    Quit date: 08/04/2007  . Smokeless tobacco: Never Used  . Alcohol Use: No  . Drug Use: No  . Sexual Activity: Yes   Other Topics Concern  . Not on file   Social History Narrative    ALLERGIES: Review of patient's allergies indicates no known allergies.  MEDICATIONS:  Current Outpatient Prescriptions  Medication Sig Dispense Refill  . aspirin 81 MG tablet Take 81 mg by mouth daily.    . Insulin Glargine (LANTUS SOLOSTAR) 100 UNIT/ML Solostar Pen Inject 52 Units into the skin every morning. Pt takes  in the AM    . insulin lispro (HUMALOG KWIKPEN) 100 UNIT/ML KiwkPen Inject 3-7 Units into the skin 3 (three) times daily as needed (FOR BLOOD SUGARS).     Marland Kitchen levothyroxine (SYNTHROID, LEVOTHROID) 300 MCG tablet Take 300 mcg by mouth every morning.    Marland Kitchen losartan (COZAAR) 100  MG tablet Take 100 mg by mouth every morning.    . triamcinolone (NASACORT) 55 MCG/ACT AERO nasal inhaler Place 2 sprays into the nose daily as needed (allergies).    Marland Kitchen UNABLE TO FIND Apply 1 application topically as needed. Diabetic skin relief foot cream    . verapamil (CALAN-SR) 180 MG CR tablet Take 180 mg by mouth daily.     No current facility-administered medications for this encounter.    REVIEW OF SYSTEMS:  Review of systems the patient reports an overall he is doing pretty well. He continues his Rituxan every 2 months and his last infusion was on 07/22/2015. He states that he has had constipation for a long time, and uses glycerin suppositories as needed for relief of this. He denies that this is a new symptom within the last year or 2. He states that he is not having any urinary symptoms whatsoever and specifically.denies any incontinence. She does not feel any pressure when voiding. He has a complete sense of emptying his bladder. He denies any hematuria, or hematochezia. He does report erectile dysfunction when asked, but has never had this worked up. He believes this may be a result of his peripheral vascular disease versus diabetes but would be interested in meeting with the urologist for further discussion. He denies any current abdominal pain, nausea or vomiting. He reports his weight has been stable. He denies any fevers or chills chest pain or shortness of breath. Complete review of systems is obtained and is otherwise negative.   PHYSICAL EXAM:  height is 6' (1.829 m) and weight is 315 lb 14.4 oz (143.291 kg). His blood pressure is 148/76 and his pulse is 57. His respiration is 16 and oxygen saturation is 100%.   In general this is an obese Caucasian male in no acute distress he is alert and oriented 1 appropriate prep examination. Cardiovascular exam reveals a regular rate and rhythm no clicks rubs or murmurs auscultated. Chest is clear to auscultation bilaterally. His abdomen is  obese, without any visible abnormalities. Bowel sounds are noted in all quadrants in the abdomen is soft nontender nondistended without any palpable fascial defects or hepatic paraspinal megaly. Lower extremities are negative for pretibial pitting edema or deep calf tenderness.  KPS = 100  100 - Normal; no complaints; no evidence of disease. 90   - Able to carry on normal activity; minor signs or symptoms of disease. 80   - Normal activity with effort; some signs or symptoms of disease. 75   - Cares for self; unable to carry on normal activity or to do active work. 60   - Requires occasional assistance, but is able to care for most of his personal needs. 50   - Requires considerable assistance and frequent medical care. 64   - Disabled; requires special care and assistance. 33   - Severely disabled; hospital admission is indicated although death not imminent. 108   - Very sick; hospital admission necessary; active supportive treatment necessary. 10   - Moribund; fatal processes progressing rapidly. 0     - Dead  Karnofsky DA, Abelmann WH, Craver LS and Burchenal [facility] [phone]) The use of the nitrogen  mustards in the palliative treatment of carcinoma: with particular reference to bronchogenic carcinoma Cancer 1 634-56  LABORATORY DATA:  Lab Results  Component Value Date   WBC 10.7* 07/22/2015   HGB 17.4* 07/22/2015   HCT 49.0 07/22/2015   MCV 88.3 07/22/2015   PLT 180 07/22/2015   Lab Results  Component Value Date   NA 131* 07/22/2015   K 3.8 07/22/2015   CL 94* 07/22/2015   CO2 26 07/22/2015   Lab Results  Component Value Date   ALT 11* 07/22/2015   AST 15 07/22/2015   ALKPHOS 102 07/22/2015   BILITOT 1.2 07/22/2015     RADIOGRAPHY: No results found.    IMPRESSION: Stage II low-grade non-Hodgkin's lymphoma, marginal zone type with persistent pelvic mass, retroperitoneal, and left pelvic adenopathy, with symptoms of erectile dysfunction.  PLAN: The patient is asymptomatic,  there is concern about the persistence of his disease within the pelvis. Dr. Tammi Klippel discusses with the patient the options for radiotherapy including radiation to the pelvic mass abutting the bladder, and treatment of the left pelvic adenopathy and retroperitoneal adenopathy. He discusses the patient that the recommendations would be for the patient to undergo between 10-12 fractions of radiotherapy to these locations, and reviews risks benefits, acute and long-term side effects of radiotherapy. The patient is interested in meeting with urology as well, and Dr. Tammi Klippel believes that this would be beneficial as well due to the location of the patient's tumor in addition to having an interest in wanting his ED worked up. We will refer [facility] to [facility] as this is closer to the patient's home, and we will plan to begin with simulation in the next couple of weeks. The patient is interested in having treatment closer to home and elects for simulation and radiation therapy at [facility].   The above documentation reflects my direct findings during this shared patient visit. Please see the separate note by Dr. Tammi Klippel on this date for the remainder of the patient's plan of care.  Carola Rhine, PAC

## 2015-08-19 NOTE — Progress Notes (Signed)
Histology and Location of Primary Cancer: low grade non-Hodgkin's lymphoma, marginal zone type  Location(s) of Symptomatic tumor(s): superior bladder (between bladder and intestines)  Past/Anticipated chemotherapy by medical oncology, if any: Rituxan every two months for two years  Patient's main complaints related to symptomatic tumor(s) are: negative for dysuria, urgency, frequency, or hematuria.    Pain on a scale of 0-10 is: No.    If Spine Met(s), symptoms, if any, include:  Bowel/Bladder retention or incontinence (please describe): No. Reports long standing issues with constipation but, can go months without a problem.   Numbness or weakness in extremities (please describe): No. Reports neuropathy in his feet (diabetes?)  Current Decadron regimen, if applicable: No. However, reports he received a cortisone injection in his left knee that has increased his blood sugar.   Ambulatory status? Walker? Wheelchair?: ambulatory  SAFETY ISSUES:  Prior radiation? No  Pacemaker/ICD? NO  Possible current pregnancy? NO  Is the patient on methotrexate? NO  Additional Complaints / other details:  70 year old male. Divorced. Has a girlfriend. Prefers to be called [name].

## 2015-09-23 ENCOUNTER — Encounter (HOSPITAL_COMMUNITY): Payer: Medicare HMO | Attending: Hematology & Oncology

## 2015-09-23 ENCOUNTER — Ambulatory Visit (HOSPITAL_COMMUNITY): Payer: Medicare HMO | Admitting: Hematology & Oncology

## 2015-09-23 VITALS — BP 108/66 | HR 56 | Temp 97.8°F | Resp 18 | Wt 310.0 lb

## 2015-09-23 DIAGNOSIS — E119 Type 2 diabetes mellitus without complications: Secondary | ICD-10-CM | POA: Diagnosis not present

## 2015-09-23 DIAGNOSIS — Z5112 Encounter for antineoplastic immunotherapy: Secondary | ICD-10-CM

## 2015-09-23 DIAGNOSIS — C8580 Other specified types of non-Hodgkin lymphoma, unspecified site: Secondary | ICD-10-CM | POA: Insufficient documentation

## 2015-09-23 LAB — COMPREHENSIVE METABOLIC PANEL
ALBUMIN: 3.7 g/dL (ref 3.5–5.0)
ALK PHOS: 91 U/L (ref 38–126)
ALT: 11 U/L — AB (ref 17–63)
ANION GAP: 11 (ref 5–15)
AST: 15 U/L (ref 15–41)
BILIRUBIN TOTAL: 0.9 mg/dL (ref 0.3–1.2)
BUN: 51 mg/dL — AB (ref 6–20)
CO2: 23 mmol/L (ref 22–32)
Calcium: 8.9 mg/dL (ref 8.9–10.3)
Chloride: 99 mmol/L — ABNORMAL LOW (ref 101–111)
Creatinine, Ser: 2.56 mg/dL — ABNORMAL HIGH (ref 0.61–1.24)
GFR calc Af Amer: 28 mL/min — ABNORMAL LOW (ref >60)
GFR calc non Af Amer: 24 mL/min — ABNORMAL LOW (ref >60)
GLUCOSE: 390 mg/dL — AB (ref 65–99)
Potassium: 3.5 mmol/L (ref 3.5–5.1)
Sodium: 133 mmol/L — ABNORMAL LOW (ref 135–145)
TOTAL PROTEIN: 6.9 g/dL (ref 6.5–8.1)

## 2015-09-23 LAB — CBC WITH DIFFERENTIAL/PLATELET
Basophils Absolute: 0 10*3/uL (ref 0.0–0.1)
Basophils Relative: 1 %
Eosinophils Absolute: 0.4 10*3/uL (ref 0.0–0.7)
Eosinophils Relative: 6 %
HEMATOCRIT: 43.3 % (ref 39.0–52.0)
HEMOGLOBIN: 15.2 g/dL (ref 13.0–17.0)
Lymphocytes Relative: 15 %
Lymphs Abs: 1 10*3/uL (ref 0.7–4.0)
MCH: 30.5 pg (ref 26.0–34.0)
MCHC: 35.1 g/dL (ref 30.0–36.0)
MCV: 86.8 fL (ref 78.0–100.0)
MONOS PCT: 8 %
Monocytes Absolute: 0.5 10*3/uL (ref 0.1–1.0)
NEUTROS ABS: 4.7 10*3/uL (ref 1.7–7.7)
NEUTROS PCT: 71 %
Platelets: 118 10*3/uL — ABNORMAL LOW (ref 150–400)
RBC: 4.99 MIL/uL (ref 4.22–5.81)
RDW: 14.8 % (ref 11.5–15.5)
WBC: 6.6 10*3/uL (ref 4.0–10.5)

## 2015-09-23 LAB — LACTATE DEHYDROGENASE: LDH: 113 U/L (ref 98–192)

## 2015-09-23 MED ORDER — ACETAMINOPHEN 325 MG PO TABS
650.0000 mg | ORAL_TABLET | Freq: Once | ORAL | Status: AC
  Administered 2015-09-23: 650 mg via ORAL
  Filled 2015-09-23: qty 2

## 2015-09-23 MED ORDER — SODIUM CHLORIDE 0.9 % IV SOLN
Freq: Once | INTRAVENOUS | Status: AC
  Administered 2015-09-23: 11:00:00 via INTRAVENOUS

## 2015-09-23 MED ORDER — DIPHENHYDRAMINE HCL 25 MG PO CAPS
50.0000 mg | ORAL_CAPSULE | Freq: Once | ORAL | Status: AC
Start: 2015-09-23 — End: 2015-09-23
  Administered 2015-09-23: 50 mg via ORAL
  Filled 2015-09-23: qty 2

## 2015-09-23 MED ORDER — INSULIN ASPART 100 UNIT/ML ~~LOC~~ SOLN
15.0000 [IU] | Freq: Once | SUBCUTANEOUS | Status: AC
  Administered 2015-09-23: 15 [IU] via SUBCUTANEOUS
  Filled 2015-09-23: qty 0.15

## 2015-09-23 MED ORDER — SODIUM CHLORIDE 0.9 % IV SOLN
375.0000 mg/m2 | Freq: Once | INTRAVENOUS | Status: AC
  Administered 2015-09-23: 1000 mg via INTRAVENOUS
  Filled 2015-09-23: qty 100

## 2015-09-23 MED ORDER — SODIUM CHLORIDE 0.9 % IJ SOLN: 10.0000 mL | INTRAMUSCULAR | Status: DC | PRN

## 2015-09-23 NOTE — Progress Notes (Signed)
Tolerated rituxan well. Insulin given as ordered. D/C home. Returns in 8 weeks for f/u and rituxan. He missed MD exam with Dr. Whitney Muse today due to XRT appt. He is ok with waiting til 8 week f/u. Patient was instructed to call if he needs [place] before then. Verbalized understanding.

## 2015-09-23 NOTE — Patient Instructions (Signed)
..  [facility] Discharge Instructions for Patients Receiving Chemotherapy   Beginning January 23rd 2017 lab work for the [facility] will be done in the  Main lab at [facility] on 1st floor. If you have a lab appointment with the [facility] please come in thru the  Main Entrance and check in at the main information desk   Today you received the following chemotherapy agents Rituxan 15 units novulog insulan  If you develop nausea and vomiting, or diarrhea that is not controlled by your medication, call the clinic.  The clinic phone number is [phone]. Office hours are Monday-Friday 8:30am-5:00pm.  BELOW ARE SYMPTOMS THAT SHOULD BE REPORTED IMMEDIATELY:  *FEVER GREATER THAN 101.0 F  *CHILLS WITH OR WITHOUT FEVER  NAUSEA AND VOMITING THAT IS NOT CONTROLLED WITH YOUR NAUSEA MEDICATION  *UNUSUAL SHORTNESS OF BREATH  *UNUSUAL BRUISING OR BLEEDING  TENDERNESS IN MOUTH AND THROAT WITH OR WITHOUT PRESENCE OF ULCERS  *URINARY PROBLEMS  *BOWEL PROBLEMS  UNUSUAL RASH Items with * indicate a potential emergency and should be followed up as soon as possible. If you have an emergency after office hours please contact your primary care physician or go to the nearest emergency department.  Please call the clinic during office hours if you have any questions or concerns.   You may also contact the Patient Navigator at [phone] should you have any questions or need assistance in obtaining follow up care.

## 2015-09-30 DIAGNOSIS — M25562 Pain in left knee: Secondary | ICD-10-CM

## 2015-09-30 DIAGNOSIS — M1990 Unspecified osteoarthritis, unspecified site: Secondary | ICD-10-CM | POA: Insufficient documentation

## 2015-09-30 DIAGNOSIS — G8929 Other chronic pain: Secondary | ICD-10-CM | POA: Insufficient documentation

## 2015-09-30 DIAGNOSIS — G4730 Sleep apnea, unspecified: Secondary | ICD-10-CM | POA: Insufficient documentation

## 2015-09-30 DIAGNOSIS — E1140 Type 2 diabetes mellitus with diabetic neuropathy, unspecified: Secondary | ICD-10-CM | POA: Insufficient documentation

## 2015-09-30 DIAGNOSIS — I10 Essential (primary) hypertension: Secondary | ICD-10-CM | POA: Insufficient documentation

## 2015-09-30 DIAGNOSIS — E291 Testicular hypofunction: Secondary | ICD-10-CM | POA: Insufficient documentation

## 2015-09-30 DIAGNOSIS — E039 Hypothyroidism, unspecified: Secondary | ICD-10-CM | POA: Insufficient documentation

## 2015-11-18 ENCOUNTER — Encounter (HOSPITAL_COMMUNITY): Payer: Self-pay | Admitting: Hematology & Oncology

## 2015-11-18 ENCOUNTER — Other Ambulatory Visit (HOSPITAL_COMMUNITY): Payer: Self-pay | Admitting: Hematology & Oncology

## 2015-11-18 ENCOUNTER — Encounter (HOSPITAL_COMMUNITY): Payer: Medicare HMO | Attending: Hematology & Oncology | Admitting: Hematology & Oncology

## 2015-11-18 ENCOUNTER — Encounter (HOSPITAL_BASED_OUTPATIENT_CLINIC_OR_DEPARTMENT_OTHER): Payer: Medicare HMO

## 2015-11-18 VITALS — BP 130/57 | HR 55 | Temp 97.7°F | Resp 18 | Wt 315.8 lb

## 2015-11-18 DIAGNOSIS — N184 Chronic kidney disease, stage 4 (severe): Secondary | ICD-10-CM

## 2015-11-18 DIAGNOSIS — E119 Type 2 diabetes mellitus without complications: Secondary | ICD-10-CM | POA: Diagnosis not present

## 2015-11-18 DIAGNOSIS — Z5112 Encounter for antineoplastic immunotherapy: Secondary | ICD-10-CM | POA: Diagnosis not present

## 2015-11-18 DIAGNOSIS — C8580 Other specified types of non-Hodgkin lymphoma, unspecified site: Secondary | ICD-10-CM

## 2015-11-18 DIAGNOSIS — E876 Hypokalemia: Secondary | ICD-10-CM

## 2015-11-18 DIAGNOSIS — D696 Thrombocytopenia, unspecified: Secondary | ICD-10-CM

## 2015-11-18 LAB — COMPREHENSIVE METABOLIC PANEL
ALT: 17 U/L (ref 17–63)
AST: 19 U/L (ref 15–41)
Albumin: 3.9 g/dL (ref 3.5–5.0)
Alkaline Phosphatase: 83 U/L (ref 38–126)
Anion gap: 12 (ref 5–15)
BUN: 38 mg/dL — AB (ref 6–20)
CHLORIDE: 100 mmol/L — AB (ref 101–111)
CO2: 24 mmol/L (ref 22–32)
CREATININE: 2.33 mg/dL — AB (ref 0.61–1.24)
Calcium: 8.7 mg/dL — ABNORMAL LOW (ref 8.9–10.3)
GFR calc Af Amer: 31 mL/min — ABNORMAL LOW (ref >60)
GFR, EST NON AFRICAN AMERICAN: 27 mL/min — AB (ref >60)
Glucose, Bld: 331 mg/dL — ABNORMAL HIGH (ref 65–99)
POTASSIUM: 3.3 mmol/L — AB (ref 3.5–5.1)
SODIUM: 136 mmol/L (ref 135–145)
Total Bilirubin: 0.7 mg/dL (ref 0.3–1.2)
Total Protein: 7.2 g/dL (ref 6.5–8.1)

## 2015-11-18 LAB — CBC WITH DIFFERENTIAL/PLATELET
BASOS ABS: 0.1 10*3/uL (ref 0.0–0.1)
BASOS PCT: 1 %
EOS ABS: 0.3 10*3/uL (ref 0.0–0.7)
EOS PCT: 5 %
HCT: 46.3 % (ref 39.0–52.0)
Hemoglobin: 15.9 g/dL (ref 13.0–17.0)
LYMPHS PCT: 13 %
Lymphs Abs: 0.9 10*3/uL (ref 0.7–4.0)
MCH: 30.3 pg (ref 26.0–34.0)
MCHC: 34.3 g/dL (ref 30.0–36.0)
MCV: 88.4 fL (ref 78.0–100.0)
MONO ABS: 0.7 10*3/uL (ref 0.1–1.0)
Monocytes Relative: 10 %
Neutro Abs: 4.9 10*3/uL (ref 1.7–7.7)
Neutrophils Relative %: 71 %
PLATELETS: 139 10*3/uL — AB (ref 150–400)
RBC: 5.24 MIL/uL (ref 4.22–5.81)
RDW: 14.8 % (ref 11.5–15.5)
WBC: 6.9 10*3/uL (ref 4.0–10.5)

## 2015-11-18 LAB — LACTATE DEHYDROGENASE: LDH: 139 U/L (ref 98–192)

## 2015-11-18 MED ORDER — ACETAMINOPHEN 325 MG PO TABS
650.0000 mg | ORAL_TABLET | Freq: Once | ORAL | Status: AC
  Administered 2015-11-18: 650 mg via ORAL
  Filled 2015-11-18: qty 2

## 2015-11-18 MED ORDER — POTASSIUM CHLORIDE CRYS ER 20 MEQ PO TBCR: 20.0000 meq | EXTENDED_RELEASE_TABLET | Freq: Every day | ORAL | Status: DC

## 2015-11-18 MED ORDER — SODIUM CHLORIDE 0.9 % IV SOLN
Freq: Once | INTRAVENOUS | Status: AC
  Administered 2015-11-18: 10:00:00 via INTRAVENOUS

## 2015-11-18 MED ORDER — SODIUM CHLORIDE 0.9 % IV SOLN
375.0000 mg/m2 | Freq: Once | INTRAVENOUS | Status: AC
  Administered 2015-11-18: 1000 mg via INTRAVENOUS
  Filled 2015-11-18: qty 100

## 2015-11-18 MED ORDER — SODIUM CHLORIDE 0.9 % IJ SOLN
10.0000 mL | INTRAMUSCULAR | Status: DC | PRN
Start: 2015-11-18 — End: 2015-11-18
  Administered 2015-11-18: 10 mL
  Filled 2015-11-18: qty 10

## 2015-11-18 MED ORDER — DIPHENHYDRAMINE HCL 25 MG PO CAPS
50.0000 mg | ORAL_CAPSULE | Freq: Once | ORAL | Status: AC
  Administered 2015-11-18: 50 mg via ORAL
  Filled 2015-11-18: qty 2

## 2015-11-18 MED ORDER — INSULIN ASPART 100 UNIT/ML ~~LOC~~ SOLN
8.0000 [IU] | Freq: Once | SUBCUTANEOUS | Status: AC
  Administered 2015-11-18: 8 [IU] via SUBCUTANEOUS
  Filled 2015-11-18: qty 0.08

## 2015-11-18 NOTE — Patient Instructions (Signed)
[facility] Discharge Instructions for Patients Receiving Chemotherapy   Beginning January 23rd 2017 lab work for the [facility] will be done in the  Main lab at [facility] on 1st floor. If you have a lab appointment with the [facility] please come in thru the  Main Entrance and check in at the main information desk   Today you received the following chemotherapy agents Rituxan.  To help prevent nausea and vomiting after your treatment, we encourage you to take your nausea medication as instructed.   If you develop nausea and vomiting, or diarrhea that is not controlled by your medication, call the clinic.  The clinic phone number is [phone]. Office hours are Monday-Friday 8:30am-5:00pm.  BELOW ARE SYMPTOMS THAT SHOULD BE REPORTED IMMEDIATELY:  *FEVER GREATER THAN 101.0 F  *CHILLS WITH OR WITHOUT FEVER  NAUSEA AND VOMITING THAT IS NOT CONTROLLED WITH YOUR NAUSEA MEDICATION  *UNUSUAL SHORTNESS OF BREATH  *UNUSUAL BRUISING OR BLEEDING  TENDERNESS IN MOUTH AND THROAT WITH OR WITHOUT PRESENCE OF ULCERS  *URINARY PROBLEMS  *BOWEL PROBLEMS  UNUSUAL RASH Items with * indicate a potential emergency and should be followed up as soon as possible. If you have an emergency after office hours please contact your primary care physician or go to the nearest emergency department.  Please call the clinic during office hours if you have any questions or concerns.   You may also contact the Patient Navigator at [phone] should you have any questions or need assistance in obtaining follow up care.  Resources For Cancer Patients and their Caregivers ? American Cancer Society: Can assist with transportation, wigs, general needs, runs Look Good Feel Better.        (915) 382-6548 ? Cancer Care: Provides financial assistance, online support groups, medication/co-pay assistance.  1-800-813-HOPE 479 207 5740) ? [place] Assists [place]  cancer patients and their families through emotional , educational and financial support.  514-464-9807 ? [place] DSS Where to apply for food stamps, Medicaid and utility assistance. (908)806-1878 ? [facility]: Transportation to medical appointments. 440-802-0194 ? Social Security Administration: May apply for disability if have a Stage IV cancer. 6517324950 (380)158-9924 ? [employer], Disability and Transit Services: Assists with nutrition, care and transit needs. (252)328-5782

## 2015-11-18 NOTE — Progress Notes (Signed)
[place] PROGRESS NOTE  Patient Care Team: Stephens Shire, MD as PCP - General (Family Medicine)  CHIEF COMPLAINTS/PURPOSE OF CONSULTATION:  Marginal Zone Lymphoma EGD 09/03/2014 negative for intestinal metaplasia, dysplasia or malignancy H pylori negative Colonoscopy on 09/03/2014 with 6 polyps (tubular adenomas), redundant left colon  Marginal zone lymphoma   Staging form: Lymphoid Neoplasms, AJCC 6th Edition     Clinical stage from 11/07/2014: Stage II - Unsigned   HISTORY OF PRESENTING ILLNESS:  [name] [name] 70 y.o. male is here because of Stage II marginal zone lymphoma.  He was admitted to [facility] in December 2015 with abdominal pain. The pain was described as severe. He also reported episodes of vomiting. At presentation he had elevated liver function tests and bilirubin, CT scan of the abdomen and pelvis showed a possible bladder mass abutting the colon.He underwent a colonoscopy on 09/03/2014 with Dr. Oneida Alar. No mass was noted on exam. 6 polyps were removed all without dysplasia on final pathology, and a redundant left colon was noted. He ultimately underwent a CT-guided biopsy of the pelvic mass on February 25, with final pathology consistent with a low-grade non-Hodgkin's lymphoma, marginal zone type.  [name] returns to the [place] alone today. He goes by the name of "[name] [name]."  He is on maintenance Rituxan.  He has been done with radiation for a month. He is happy that he is done with this because he felt very weak during the treatment. He said that when he would go home afterwards he could hardly make it up the stairs. He also had some diarrhea during radiation.   He states that he still cannot get his strength up. Yesterday he was working around the house and had some trouble picking things up. He says that he still tries to stay active but it is hard because his knees and feet hurt him but this is chronic.  He says that he has no problems going to  sleep but wakes up in the middle of the night and cannot go back to sleep. This has been happening for years and is not any different.  He eats "too good" with his girlfriend because she cooks him delicious meals.  He denies any chest pain or breathing problems. No night sweats. No new pain.     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[place])   07/23/2014 Imaging CT C/A/P with soft tissue mass in pelvis, LN adjacent to distal esophagus, upper abdomen a partially calcified soft tissue mass   09/27/2014 Initial Biopsy CT guided biopsy   10/17/2014 PET scan Hypermetabolic mass left adjacent to the bladder. The mass is mild to moderate in metabolic activity consistent with low-grade lymphoma. Mild left external iliac and common iliac metabolic adenopathy.Single retroperitoneal node anterior to L renal vein   10/31/2014 - 11/21/2014 Chemotherapy Single Agent Rituxan weekly x 4   09/05/2015 - 09/23/2015 Radiation Therapy Palliative XRT to palvic mass adjacent to bladder, 24 Gy in 12 fractions by Dr. Tammi Klippel     MEDICAL HISTORY:  Past Medical History  Diagnosis Date  . Peripheral vascular disease (HCC)     neuropathy in toes   . Shortness of breath     due ot pain in knees   . Sleep apnea     sleep study 3/29 13 ? location   . Pneumonia     hx of   . Diabetes mellitus   . Hypothyroidism   . Chronic kidney disease     stage III  kidney disease   . S/P biopsy   . Lymphoma ([place])   . Cancer ([place])     low grade non hodgkin's lymphoma    SURGICAL HISTORY: Past Surgical History  Procedure Laterality Date  . Cholecystectomy  1992  . Total knee arthroplasty  11/10/2011    Procedure: TOTAL KNEE ARTHROPLASTY;  Surgeon: Mauri Pole, MD;  Location: [facility];  Service: Orthopedics;  Laterality: Right;  . Colonoscopy N/A 09/03/2014    SLF:six colon polyps removed/small internal hemorrhoids  . Esophagogastroduodenoscopy N/A 09/03/2014    SLF: mild gastritis/few gastric polyps    SOCIAL HISTORY: Social History   Social  History  . Marital Status: Divorced    Spouse Name: N/A  . Number of Children: N/A  . Years of Education: N/A   Occupational History  . Not on file.   Social History Main Topics  . Smoking status: Former Smoker -- 1.00 packs/day for 30 years    Types: Cigarettes    Quit date: 08/04/2007  . Smokeless tobacco: Never Used  . Alcohol Use: No  . Drug Use: No  . Sexual Activity: Yes   Other Topics Concern  . Not on file   Social History Narrative    FAMILY HISTORY: Family History  Problem Relation Age of Onset  . Colon cancer Neg Hx   . Cancer Mother     breast and lung  . Cancer Father     bladder  . Cancer Maternal Uncle     prostate  . Cancer Paternal Uncle     esophagus   has no family status information on file.   ALLERGIES:  has No Known Allergies.  MEDICATIONS:  Current Outpatient Prescriptions  Medication Sig Dispense Refill  . aspirin 81 MG tablet Take 81 mg by mouth daily.    . Insulin Glargine (LANTUS SOLOSTAR) 100 UNIT/ML Solostar Pen Inject 52 Units into the skin every morning. Pt takes in the AM    . insulin lispro (HUMALOG KWIKPEN) 100 UNIT/ML KiwkPen Inject 3-7 Units into the skin 3 (three) times daily as needed (FOR BLOOD SUGARS).     Marland Kitchen levothyroxine (SYNTHROID, LEVOTHROID) 300 MCG tablet Take 300 mcg by mouth every morning.    Marland Kitchen losartan (COZAAR) 100 MG tablet Take 100 mg by mouth every morning.    . triamcinolone (NASACORT) 55 MCG/ACT AERO nasal inhaler Place 2 sprays into the nose daily as needed (allergies).    Marland Kitchen UNABLE TO FIND Apply 1 application topically as needed. Diabetic skin relief foot cream    . verapamil (CALAN-SR) 180 MG CR tablet Take 180 mg by mouth daily.     No current facility-administered medications for this visit.   Facility-Administered Medications Ordered in Other Visits  Medication Dose Route Frequency Provider Last Rate Last Dose  . sodium chloride 0.9 % injection 10 mL  10 mL Intracatheter PRN Patrici Ranks, MD   10  mL at 11/18/15 0920    Review of Systems  Constitutional: Negative for fever, chills, weight loss and malaise/fatigue.  HENT: Negative for congestion, hearing loss, nosebleeds, sore throat and tinnitus.   Eyes: Negative for blurred vision, double vision, pain and discharge.  Respiratory: Negative for cough, hemoptysis, sputum production, shortness of breath and wheezing.   Cardiovascular: Negative for chest pain, palpitations, claudication, leg swelling and PND.  Gastrointestinal: Negative for heartburn, nausea, vomiting, abdominal pain, diarrhea, constipation, blood in stool and melena.  Genitourinary: Negative for dysuria, urgency, frequency and hematuria.  Musculoskeletal: Negative for myalgias,  joint pain and falls.  Skin: Negative for itching and rash.  Neurological: Negative for dizziness, tingling, tremors, sensory change, speech change, focal weakness, seizures, loss of consciousness, weakness and headaches.  Endo/Heme/Allergies: Does not bruise/bleed easily.  Psychiatric/Behavioral: Negative for depression, suicidal ideas, memory loss and substance abuse. The patient is not nervous/anxious and does not have insomnia.   14 point review of systems was performed and is negative except as detailed under history of present illness and above   PHYSICAL EXAMINATION: ECOG PERFORMANCE STATUS: 0 - Asymptomatic Vitals with BMI 11/18/2015  Height   Weight 315 lbs 13 oz  BMI   Systolic 134  Diastolic 69  Pulse 65  Respirations 20   Physical Exam  Constitutional: He is oriented to person, place, and time and well-developed, well-nourished, and in no distress.  Obese  HENT:  Head: Normocephalic and atraumatic.  Nose: Nose normal.  Mouth/Throat: Oropharynx is clear and moist. No oropharyngeal exudate.  Eyes: Conjunctivae and EOM are normal. Pupils are equal, round, and reactive to light. Right eye exhibits no discharge. Left eye exhibits no discharge. No scleral icterus.  Neck: Normal  range of motion. Neck supple. No tracheal deviation present. No thyromegaly present.  Cardiovascular: Normal rate, regular rhythm and normal heart sounds.  Exam reveals no gallop and no friction rub.  No murmur heard. Pulmonary/Chest: Effort normal and breath sounds normal. He has no wheezes. He has no rales.  Abdominal: Soft. Bowel sounds are normal. He exhibits no distension and no mass. There is no tenderness. There is no rebound and no guarding.  Musculoskeletal: Normal range of motion. He exhibits no edema.  Chronic LE skin/vascular changes Lymphadenopathy:    He has no cervical adenopathy.  Neurological: He is alert and oriented to person, place, and time. He has normal reflexes. No cranial nerve deficit. Gait normal. Coordination normal.  Skin: Skin is warm and dry. No rash noted.  Psychiatric: Mood, memory, affect and judgment normal.  Nursing note and vitals reviewed.  LABORATORY DATA:  I have reviewed the data as listed  Results for ASHRAF, MESTA (MRN [id number]) as of 11/18/2015 08:14  Ref. Range 09/23/2015 09:32  Sodium Latest Ref Range: 135-145 mmol/L 133 (L)  Potassium Latest Ref Range: 3.5-5.1 mmol/L 3.5  Chloride Latest Ref Range: 101-111 mmol/L 99 (L)  CO2 Latest Ref Range: 22-32 mmol/L 23  BUN Latest Ref Range: 6-20 mg/dL 51 (H)  Creatinine Latest Ref Range: 0.61-1.24 mg/dL 8.56 (H)  Calcium Latest Ref Range: 8.9-10.3 mg/dL 8.9  EGFR (Non-African Amer.) Latest Ref Range: >60 mL/min 24 (L)  EGFR (African American) Latest Ref Range: >60 mL/min 28 (L)  Glucose Latest Ref Range: 65-99 mg/dL 879 (H)  Anion gap Latest Ref Range: 5-15  11  Alkaline Phosphatase Latest Ref Range: 38-126 U/L 91  Albumin Latest Ref Range: 3.5-5.0 g/dL 3.7  AST Latest Ref Range: 15-41 U/L 15  ALT Latest Ref Range: 17-63 U/L 11 (L)  Total Protein Latest Ref Range: 6.5-8.1 g/dL 6.9  Total Bilirubin Latest Ref Range: 0.3-1.2 mg/dL 0.9  LDH Latest Ref Range: 98-192 U/L 113  WBC Latest Ref Range:  4.0-10.5 K/uL 6.6  RBC Latest Ref Range: 4.22-5.81 MIL/uL 4.99  Hemoglobin Latest Ref Range: 13.0-17.0 g/dL 30.2  HCT Latest Ref Range: 39.0-52.0 % 43.3  MCV Latest Ref Range: 78.0-100.0 fL 86.8  MCH Latest Ref Range: 26.0-34.0 pg 30.5  MCHC Latest Ref Range: 30.0-36.0 g/dL 10.7  RDW Latest Ref Range: 11.5-15.5 % 14.8  Platelets Latest Ref Range: 150-400  K/uL 118 (L)  Neutrophils Latest Units: % 71  Lymphocytes Latest Units: % 15  Monocytes Relative Latest Units: % 8  Eosinophil Latest Units: % 6  Basophil Latest Units: % 1  NEUT# Latest Ref Range: 1.7-7.7 K/uL 4.7  Lymphocyte # Latest Ref Range: 0.7-4.0 K/uL 1.0  Monocyte # Latest Ref Range: 0.1-1.0 K/uL 0.5  Eosinophils Absolute Latest Ref Range: 0.0-0.7 K/uL 0.4  Basophils Absolute Latest Ref Range: 0.0-0.1 K/uL 0.0   PATHOLOGY:  Soft tissue mass, biopsy, adjacent to urinary bladder - ATYPICAL LYMPHOID PROLIFERATION CONSISTENT WITH NON-HODGKIN'S B-CELL LYMPHOMA. - SEE ONCOLOGY TABLE. Histologic type: Non-Hodgkin's lymphoma, see comment. Grade (if applicable): Low grade. Flow cytometry: No tissue is available for analysis since the specimen was received in formalin. Immunohistochemical stains: BCL-6, CD3, CD5, CD10, CD20, CD21, CD34, CD43, CD79a, CD138, Cyclin D-1, kappa, lambda, TdT, and Ki-67 with appropriate controls. Touch preps/imprints: Not performed. Comments: The sections show needle core biopsy of soft tissue displaying a very dense and relatively monomorphic infiltrate of small lymphoid cells with high nuclear cytoplasmic ratio, round to irregular nuclei, dense chromatin, and small to inconspicuous nucleoli. The appearance is diffuse with lack of obvious follicular structures or proliferation centers. No necrosis or conspicuous mitosis is identified. To further evaluate this process, immunohistochemical stains were performed and show that the overwhelming majority of lymphocytes consist of B-cells, as highlighted with  CD20 and CD79a. B-lymphocytes show no significant staining with BCL-6, CD10, CD5, Cyclin D-1, CD34, or TdT. CD21 highlights scattered small foci of dendritic networks throughout the core biopsies. CD138 highlights a minor plasma cell component, which consist of scattered cells and variably sized but predominantly small clusters. Kappa and lambda stains failed to show any significant staining and are considered non-contributory. Ki-67 shows very low expression (less than 5%). There is an admixed minor T-cell component present as seen with CD3, CD5, and CD43. The overall features are consistent with low grade non-Hodgkin's B-cell lymphoma and the overall phenotypic features favor marginal zone type. Clinical correlation is strongly recommended. (BNS:ds 10/01/14)   RADIOGRAPHIC STUDIES: I have personally reviewed the radiological images as listed and agreed with the findings in the report.  CLINICAL DATA: Restaging non-Hodgkin's lymphoma. History of diabetes and hypertension. Renal failure. Subsequent encounter.  EXAM: CT ABDOMEN AND PELVIS WITHOUT CONTRAST  TECHNIQUE: Multidetector CT imaging of the abdomen and pelvis was performed following the standard protocol without IV contrast.  COMPARISON: PET-CT 05/31/2015. Abdominal pelvic CT 07/23/2014.  FINDINGS: Lower chest: Clear lung bases. No significant pleural or pericardial effusion. Right infrahilar nodal tissue appears unchanged.  Hepatobiliary: The liver demonstrates diffusely decreased density consistent with steatosis. As evaluated in the noncontrast state, the liver appears unchanged without apparent focal abnormality. No biliary dilatation status post cholecystectomy.  Pancreas: Unremarkable. No pancreatic ductal dilatation or surrounding inflammatory changes.  Spleen: Stable mild splenomegaly. There is a small splenule at the hilum which is unchanged.  Adrenals/Urinary Tract: Both adrenal glands appear normal.  Both kidneys are atrophied with cortical thinning. No mass lesion, hydronephrosis or urinary tract calculus demonstrated. A soft tissue mass draping over the superior left aspect of the bladder is again noted, measuring approximately 7.4 x 5.3 cm transverse on image 75, similar to PET-CT. This has slightly improved compared with the prior abdominal CT.  Stomach/Bowel: No evidence of bowel wall thickening, distention or surrounding inflammatory change. There is moderate stool throughout the colon.  Vascular/Lymphatic: Retroperitoneal and left pelvic adenopathy is similar to the most recent study. There is a 14 mm  node anterior to the left renal vein on image 33, a 10 mm left common iliac node on image 58 and a 10 mm left external iliac node on image 72. No progressive adenopathy demonstrated. Mild aortoiliac atherosclerosis appears unchanged.  Reproductive: Unremarkable.  Other: The partially calcified superior omental mass inferior to the xiphoid process is unchanged, measuring up to 3.1 cm on coronal image 14. No ascites or peritoneal nodularity. Mildly prominent fat in the inguinal canals is stable.  Musculoskeletal: No acute or significant osseous findings. Stable mild lumbar spondylosis.  IMPRESSION: 1. Stable mild splenomegaly and stable small retroperitoneal and left pelvic lymph nodes. 2. No evidence of disease progression. 3. Stable pelvic mass involving the left superior aspect of the bladder. No evidence of hydronephrosis.   Electronically Signed  By: Richardean Sale M.D.  On: 06/07/2015 13:16    ASSESSMENT & PLAN:  Stage II marginal zone lymphoma CKD Mild Thrombocytopenia, intermittent Diabetes  He has done well  with single agent Rituxan. We will continue with maintenance to complete 2 years. He will be due for repeat imaging prior to follow-up to reassess his disease, he has completed XRT to the pelvis.   I will put him down for follow-up in  2 months. We will review imaging at that time.   Platelet count will continue to be followed with observation only for now.    All questions were answered. The patient knows to call the clinic with any problems, questions or concerns.   This note was electronically signed.   This document serves as a record of services personally performed by Ancil Linsey, MD. It was created on her behalf by Kandace Blitz, a trained medical scribe. The creation of this record is based on the scribe's personal observations and the provider's statements to them. This document has been checked and approved by the attending provider.  I have reviewed the above documentation for accuracy and completeness, and I agree with the above.   Kelby Fam. Whitney Muse, MD

## 2015-11-18 NOTE — Progress Notes (Signed)
Tolerated chemo well. Ambulatory on discharge home to self. 

## 2015-11-18 NOTE — Patient Instructions (Signed)
[place] at [facility] Discharge Instructions  RECOMMENDATIONS MADE BY THE CONSULTANT AND ANY TEST RESULTS WILL BE SENT TO YOUR REFERRING PHYSICIAN.  Exam done and seen today by Dr. Gustavus Bryant reviewed Chemo today if labs ok. Pet scan in 2 months Return to see the doctor in 20months after pet scan Please call the clinic if you have any questions or concerns  Thank you for choosing [place] at [facility] to provide your oncology and hematology care.  To afford each patient quality time with our provider, please arrive at least 15 minutes before your scheduled appointment time.   Beginning January 23rd 2017 lab work for the [facility] will be done in the  Main lab at [facility] on 1st floor. If you have a lab appointment with the [facility] please come in thru the  Main Entrance and check in at the main information desk  You need to re-schedule your appointment should you arrive 10 or more minutes late.  We strive to give you quality time with our providers, and arriving late affects you and other patients whose appointments are after yours.  Also, if you no show three or more times for appointments you may be dismissed from the clinic at the providers discretion.     Again, thank you for choosing [facility].  Our hope is that these requests will decrease the amount of time that you wait before being seen by our physicians.       _____________________________________________________________  Should you have questions after your visit to [facility], please contact our office at [phone] between the hours of 8:30 a.m. and 4:30 p.m.  Voicemails left after 4:30 p.m. will not be returned until the following business day.  For prescription refill requests, have your pharmacy contact our office.         Resources For Cancer Patients and their Caregivers ? American Cancer Society: Can assist with  transportation, wigs, general needs, runs Look Good Feel Better.        305-887-8786 ? Cancer Care: Provides financial assistance, online support groups, medication/co-pay assistance.  1-800-813-HOPE (218) 688-0575) ? [place] Assists [place] cancer patients and their families through emotional , educational and financial support.  906-073-1680 ? [place] DSS Where to apply for food stamps, Medicaid and utility assistance. 952 284 2523 ? [employer]: Transportation to medical appointments. 832-056-5699 ? Social Security Administration: May apply for disability if have a Stage IV cancer. 339-423-4377 775-136-7623 ? [employer], Disability and Transit Services: Assists with nutrition, care and transit needs. (248)450-3743

## 2015-12-19 DIAGNOSIS — N529 Male erectile dysfunction, unspecified: Secondary | ICD-10-CM | POA: Insufficient documentation

## 2016-01-20 ENCOUNTER — Encounter (HOSPITAL_COMMUNITY): Payer: Medicare HMO

## 2016-01-23 ENCOUNTER — Encounter (HOSPITAL_COMMUNITY): Payer: Self-pay

## 2016-01-23 ENCOUNTER — Encounter (HOSPITAL_COMMUNITY): Payer: Medicare HMO | Attending: Hematology & Oncology

## 2016-01-23 ENCOUNTER — Ambulatory Visit (HOSPITAL_COMMUNITY): Payer: Medicare HMO | Admitting: Oncology

## 2016-01-23 VITALS — BP 133/75 | HR 60 | Temp 97.8°F | Resp 18 | Wt 315.0 lb

## 2016-01-23 DIAGNOSIS — C8580 Other specified types of non-Hodgkin lymphoma, unspecified site: Secondary | ICD-10-CM | POA: Diagnosis not present

## 2016-01-23 DIAGNOSIS — Z5112 Encounter for antineoplastic immunotherapy: Secondary | ICD-10-CM

## 2016-01-23 LAB — COMPREHENSIVE METABOLIC PANEL
ALBUMIN: 4 g/dL (ref 3.5–5.0)
ALK PHOS: 76 U/L (ref 38–126)
ALT: 16 U/L — AB (ref 17–63)
ANION GAP: 9 (ref 5–15)
AST: 18 U/L (ref 15–41)
BILIRUBIN TOTAL: 1.1 mg/dL (ref 0.3–1.2)
BUN: 31 mg/dL — AB (ref 6–20)
CALCIUM: 9.5 mg/dL (ref 8.9–10.3)
CO2: 26 mmol/L (ref 22–32)
Chloride: 99 mmol/L — ABNORMAL LOW (ref 101–111)
Creatinine, Ser: 2.3 mg/dL — ABNORMAL HIGH (ref 0.61–1.24)
GFR calc Af Amer: 32 mL/min — ABNORMAL LOW (ref >60)
GFR calc non Af Amer: 28 mL/min — ABNORMAL LOW (ref >60)
GLUCOSE: 320 mg/dL — AB (ref 65–99)
Potassium: 3.8 mmol/L (ref 3.5–5.1)
SODIUM: 134 mmol/L — AB (ref 135–145)
TOTAL PROTEIN: 7.4 g/dL (ref 6.5–8.1)

## 2016-01-23 LAB — CBC WITH DIFFERENTIAL/PLATELET
BASOS PCT: 1 %
Basophils Absolute: 0.1 10*3/uL (ref 0.0–0.1)
Eosinophils Absolute: 0.2 10*3/uL (ref 0.0–0.7)
Eosinophils Relative: 4 %
HEMATOCRIT: 44.4 % (ref 39.0–52.0)
Hemoglobin: 15.3 g/dL (ref 13.0–17.0)
LYMPHS PCT: 20 %
Lymphs Abs: 1.1 10*3/uL (ref 0.7–4.0)
MCH: 29.5 pg (ref 26.0–34.0)
MCHC: 34.5 g/dL (ref 30.0–36.0)
MCV: 85.5 fL (ref 78.0–100.0)
MONO ABS: 0.6 10*3/uL (ref 0.1–1.0)
Monocytes Relative: 11 %
NEUTROS ABS: 3.6 10*3/uL (ref 1.7–7.7)
Neutrophils Relative %: 64 %
Platelets: 132 10*3/uL — ABNORMAL LOW (ref 150–400)
RBC: 5.19 MIL/uL (ref 4.22–5.81)
RDW: 15.1 % (ref 11.5–15.5)
WBC: 5.6 10*3/uL (ref 4.0–10.5)

## 2016-01-23 MED ORDER — HEPARIN SOD (PORK) LOCK FLUSH 100 UNIT/ML IV SOLN: 500.0000 [IU] | Freq: Once | INTRAVENOUS | Status: DC | PRN

## 2016-01-23 MED ORDER — SODIUM CHLORIDE 0.9 % IV SOLN
375.0000 mg/m2 | Freq: Once | INTRAVENOUS | Status: AC
  Administered 2016-01-23: 1000 mg via INTRAVENOUS
  Filled 2016-01-23: qty 100

## 2016-01-23 MED ORDER — DIPHENHYDRAMINE HCL 25 MG PO CAPS
50.0000 mg | ORAL_CAPSULE | Freq: Once | ORAL | Status: AC
  Administered 2016-01-23: 50 mg via ORAL
  Filled 2016-01-23: qty 2

## 2016-01-23 MED ORDER — ACETAMINOPHEN 325 MG PO TABS
650.0000 mg | ORAL_TABLET | Freq: Once | ORAL | Status: AC
  Administered 2016-01-23: 650 mg via ORAL
  Filled 2016-01-23: qty 2

## 2016-01-23 MED ORDER — SODIUM CHLORIDE 0.9 % IV SOLN
Freq: Once | INTRAVENOUS | Status: AC
Start: 2016-01-23 — End: 2016-01-23
  Administered 2016-01-23: 11:00:00 via INTRAVENOUS

## 2016-01-23 MED ORDER — SODIUM CHLORIDE 0.9 % IJ SOLN: 10.0000 mL | INTRAMUSCULAR | Status: DC | PRN

## 2016-01-23 NOTE — Progress Notes (Signed)
[name] [name] Tolerated chemotherapy well today discharged ambultory

## 2016-01-23 NOTE — Patient Instructions (Signed)
[place] at [facility] Discharge Instructions  RECOMMENDATIONS MADE BY THE CONSULTANT AND ANY TEST RESULTS WILL BE SENT TO YOUR REFERRING PHYSICIAN.  rituxan today Follow up as scheduled  Please call the clinic if you have any questions or concerns   Thank you for choosing [place] at [facility] to provide your oncology and hematology care.  To afford each patient quality time with our provider, please arrive at least 15 minutes before your scheduled appointment time.   Beginning January 23rd 2017 lab work for the [facility] will be done in the  Main lab at [facility] on 1st floor. If you have a lab appointment with the [facility] please come in thru the  Main Entrance and check in at the main information desk  You need to re-schedule your appointment should you arrive 10 or more minutes late.  We strive to give you quality time with our providers, and arriving late affects you and other patients whose appointments are after yours.  Also, if you no show three or more times for appointments you may be dismissed from the clinic at the providers discretion.     Again, thank you for choosing [facility].  Our hope is that these requests will decrease the amount of time that you wait before being seen by our physicians.       _____________________________________________________________  Should you have questions after your visit to [facility], please contact our office at [phone] between the hours of 8:30 a.m. and 4:30 p.m.  Voicemails left after 4:30 p.m. will not be returned until the following business day.  For prescription refill requests, have your pharmacy contact our office.         Resources For Cancer Patients and their Caregivers ? American Cancer Society: Can assist with transportation, wigs, general needs, runs Look Good Feel Better.        413-150-1245 ? Cancer Care: Provides  financial assistance, online support groups, medication/co-pay assistance.  1-800-813-HOPE 914-734-7390) ? [place] Assists [place] cancer patients and their families through emotional , educational and financial support.  (631)056-0030 ? [place] DSS Where to apply for food stamps, Medicaid and utility assistance. 6612819222 ? [employer]: Transportation to medical appointments. 607-071-6267 ? Social Security Administration: May apply for disability if have a Stage IV cancer. 508 331 0292 289-096-7613 ? [employer], Disability and Transit Services: Assists with nutrition, care and transit needs. [place] Support Programs: @10RELATIVEDAYS @ > Cancer Support Group  2nd Tuesday of the month 1pm-2pm, Journey Room  > Creative Journey  3rd Tuesday of the month 1130am-1pm, Journey Room  > Look Good Feel Better  1st Wednesday of the month 10am-12 noon, Journey Room (Call [place] to register [phone])

## 2016-01-27 ENCOUNTER — Ambulatory Visit (HOSPITAL_COMMUNITY)
Admission: RE | Admit: 2016-01-27 | Discharge: 2016-01-27 | Disposition: A | Discharge status: Home / Self Care | Payer: Medicare HMO | Source: Ambulatory Visit | Attending: Hematology & Oncology | Admitting: Hematology & Oncology

## 2016-01-27 DIAGNOSIS — R590 Localized enlarged lymph nodes: Secondary | ICD-10-CM | POA: Insufficient documentation

## 2016-01-27 DIAGNOSIS — R1900 Intra-abdominal and pelvic swelling, mass and lump, unspecified site: Secondary | ICD-10-CM | POA: Diagnosis not present

## 2016-01-27 DIAGNOSIS — C8580 Other specified types of non-Hodgkin lymphoma, unspecified site: Secondary | ICD-10-CM | POA: Diagnosis not present

## 2016-01-27 LAB — GLUCOSE, CAPILLARY: GLUCOSE-CAPILLARY: 203 mg/dL — AB (ref 65–99)

## 2016-01-27 MED ORDER — FLUDEOXYGLUCOSE F - 18 (FDG) INJECTION
16.0100 | Freq: Once | INTRAVENOUS | Status: AC | PRN
  Administered 2016-01-27: 16.01 via INTRAVENOUS

## 2016-01-28 ENCOUNTER — Encounter (HOSPITAL_BASED_OUTPATIENT_CLINIC_OR_DEPARTMENT_OTHER): Payer: Medicare HMO | Admitting: Oncology

## 2016-01-28 VITALS — BP 141/75 | HR 63 | Temp 98.8°F | Resp 20 | Wt 315.5 lb

## 2016-01-28 DIAGNOSIS — C8580 Other specified types of non-Hodgkin lymphoma, unspecified site: Secondary | ICD-10-CM

## 2016-01-28 NOTE — Patient Instructions (Addendum)
[place] at [facility] Discharge Instructions  RECOMMENDATIONS MADE BY THE CONSULTANT AND ANY TEST RESULTS WILL BE SENT TO YOUR REFERRING PHYSICIAN.  Labs 2 months  Rituxan 2 months  PET 6 months  Return in 2 months for follow up   Thank you for choosing [place] at [facility] to provide your oncology and hematology care.  To afford each patient quality time with our provider, please arrive at least 15 minutes before your scheduled appointment time.   Beginning January 23rd 2017 lab work for the [name] [name] will be done in the  Main lab at [facility] on 1st floor. If you have a lab appointment with the [place] please come in thru the  Main Entrance and check in at the main information desk  You need to re-schedule your appointment should you arrive 10 or more minutes late.  We strive to give you quality time with our providers, and arriving late affects you and other patients whose appointments are after yours.  Also, if you no show three or more times for appointments you may be dismissed from the clinic at the providers discretion.     Again, thank you for choosing [facility].  Our hope is that these requests will decrease the amount of time that you wait before being seen by our physicians.       _____________________________________________________________  Should you have questions after your visit to [facility], please contact our office at [phone] between the hours of 8:30 a.m. and 4:30 p.m.  Voicemails left after 4:30 p.m. will not be returned until the following business day.  For prescription refill requests, have your pharmacy contact our office.         Resources For Cancer Patients and their Caregivers ? American Cancer Society: Can assist with transportation, wigs, general needs, runs Look Good Feel Better.        (740)169-8690 ? Cancer Care: Provides financial  assistance, online support groups, medication/co-pay assistance.  1-800-813-HOPE 567 393 4699) ? [place] Assists [place] cancer patients and their families through emotional , educational and financial support.  480 644 1280 ? [place] DSS Where to apply for food stamps, Medicaid and utility assistance. (684)295-4104 ? [facility]: Transportation to medical appointments. 769-597-1537 ? Social Security Administration: May apply for disability if have a Stage IV cancer. (614) 337-9970 417-110-4770 ? [employer], Disability and Transit Services: Assists with nutrition, care and transit needs. [place] Support Programs: @10RELATIVEDAYS @ > Cancer Support Group  2nd Tuesday of the month 1pm-2pm, Journey Room  > Creative Journey  3rd Tuesday of the month 1130am-1pm, Journey Room  > Look Good Feel Better  1st Wednesday of the month 10am-12 noon, Journey Room (Call [place] to register [phone])

## 2016-01-28 NOTE — Progress Notes (Signed)
[name] Shire, MD [address] [place] [address]  Marginal zone lymphoma [facility]) - Plan: NM PET Image Restag (PS) Skull Base To Thigh  CURRENT THERAPY: Rituxan maintenance every 60 day  INTERVAL HISTORY: [name] [name] 70 y.o. 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  Now on maintenance Rituxan every 60 days.    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[place])   07/23/2014 Imaging CT C/A/P with soft tissue mass in pelvis, LN adjacent to distal esophagus, upper abdomen a partially calcified soft tissue mass   09/27/2014 Initial Biopsy CT guided biopsy   10/17/2014 PET scan Hypermetabolic mass left adjacent to the bladder. The mass is mild to moderate in metabolic activity consistent with low-grade lymphoma. Mild left external iliac and common iliac metabolic adenopathy.Single retroperitoneal node anterior to L renal vein   10/31/2014 - 11/21/2014 Chemotherapy Single Agent Rituxan weekly x 4   01/21/2015 -  Antibody Plan    09/05/2015 - 09/23/2015 Radiation Therapy Palliative XRT to palvic mass adjacent to bladder, 24 Gy in 12 fractions by Dr. Tammi Klippel   01/27/2016 PET scan Hypermetabolic anterior left pelvic mass abutting the left superior bladder wall has decreased in size but mildly increased in metabolism, consistent with mild metabolic progression.   He denies any B symptoms.  He continues to have issues with hyperglycemia which is being managed by PCP.  His glucose last week in the clinic was > 300.    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    Review of Systems  Constitutional: Negative for fever, chills, weight loss and malaise/fatigue.  HENT: Negative.   Eyes: Negative.   Respiratory: Negative.   Cardiovascular: Negative.   Gastrointestinal: Negative.   Genitourinary: Negative.   Musculoskeletal: Negative.   Skin: Negative.   Neurological: Positive  for tingling (peripheral neuropathy in lower extremitites.).  Endo/Heme/Allergies: Negative.   Psychiatric/Behavioral: Negative.     Past Medical History  Diagnosis Date  . Peripheral vascular disease (HCC)     neuropathy in toes   . Shortness of breath     due ot pain in knees   . Sleep apnea     sleep study 3/29 13 ? location   . Pneumonia     hx of   . Diabetes mellitus   . Hypothyroidism   . Chronic kidney disease     stage III kidney disease   . S/P biopsy   . Lymphoma ([place])   . Cancer [facility])     low grade non hodgkin's lymphoma    Past Surgical History  Procedure Laterality Date  . Cholecystectomy  1992  . Total knee arthroplasty  11/10/2011    Procedure: TOTAL KNEE ARTHROPLASTY;  Surgeon: Mauri Pole, MD;  Location: [facility] ORS;  Service: Orthopedics;  Laterality: Right;  . Colonoscopy N/A 09/03/2014    SLF:six colon polyps removed/small internal hemorrhoids  . Esophagogastroduodenoscopy N/A 09/03/2014    SLF: mild gastritis/few gastric polyps    Family History  Problem Relation Age of Onset  . Colon cancer Neg Hx   . Cancer Mother     breast and lung  . Cancer Father     bladder  . Cancer Maternal Uncle     prostate  . Cancer Paternal Uncle     esophagus    Social History   Social History  . Marital Status: Divorced    Spouse  Name: N/A  . Number of Children: N/A  . Years of Education: N/A   Social History Main Topics  . Smoking status: Former Smoker -- 1.00 packs/day for 30 years    Types: Cigarettes    Quit date: 08/04/2007  . Smokeless tobacco: Never Used  . Alcohol Use: No  . Drug Use: No  . Sexual Activity: Yes   Other Topics Concern  . Not on file   Social History Narrative     PHYSICAL EXAMINATION  ECOG PERFORMANCE STATUS: 1 - Symptomatic but completely ambulatory  Filed Vitals:   01/28/16 0954  BP: 141/75  Pulse: 63  Temp: 98.8 F (37.1 C)  Resp: 20    GENERAL:alert, no distress, well nourished, well developed, comfortable,  cooperative, obese, smiling and unaccompanied SKIN: skin color, texture, turgor are normal, no rashes or significant lesions HEAD: Normocephalic, No masses, lesions, tenderness or abnormalities EYES: normal, EOMI, Conjunctiva are pink and non-injected EARS: External ears normal OROPHARYNX:lips, buccal mucosa, and tongue normal and mucous membranes are moist  NECK: supple, trachea midline LYMPH:  no palpable lymphadenopathy BREAST:not examined LUNGS: clear to auscultation  HEART: regular rate & rhythm, no murmurs and no gallops ABDOMEN:abdomen soft, obese and normal bowel sounds BACK: Back symmetric, no curvature. EXTREMITIES:less then 2 second capillary refill, no joint deformities, effusion, or inflammation, no cyanosis, positive findings:  Lower extremity skin changes associated with poor vascular flow and chronic complications with hyperglycemia.  NEURO: alert & oriented x 3 with fluent speech   LABORATORY DATA: CBC    Component Value Date/Time   WBC 5.6 01/23/2016 1010   RBC 5.19 01/23/2016 1010   HGB 15.3 01/23/2016 1010   HCT 44.4 01/23/2016 1010   PLT 132* 01/23/2016 1010   MCV 85.5 01/23/2016 1010   MCH 29.5 01/23/2016 1010   MCHC 34.5 01/23/2016 1010   RDW 15.1 01/23/2016 1010   LYMPHSABS 1.1 01/23/2016 1010   MONOABS 0.6 01/23/2016 1010   EOSABS 0.2 01/23/2016 1010   BASOSABS 0.1 01/23/2016 1010      Chemistry      Component Value Date/Time   NA 134* 01/23/2016 1010   K 3.8 01/23/2016 1010   CL 99* 01/23/2016 1010   CO2 26 01/23/2016 1010   BUN 31* 01/23/2016 1010   CREATININE 2.30* 01/23/2016 1010      Component Value Date/Time   CALCIUM 9.5 01/23/2016 1010   ALKPHOS 76 01/23/2016 1010   AST 18 01/23/2016 1010   ALT 16* 01/23/2016 1010   BILITOT 1.1 01/23/2016 1010        PENDING LABS:   RADIOGRAPHIC STUDIES:  Nm Pet Image Restag (ps) Skull Base To Thigh  01/27/2016  CLINICAL DATA:  Subsequent treatment strategy for marginal zone non-Hodgkin's  lymphoma, presenting for restaging. EXAM: NUCLEAR MEDICINE PET SKULL BASE TO THIGH TECHNIQUE: 16.0 mCi F-18 FDG was injected intravenously. Full-ring PET imaging was performed from the skull base to thigh after the radiotracer. CT data was obtained and used for attenuation correction and anatomic localization. FASTING BLOOD GLUCOSE:  Value: 203 mg/dl COMPARISON:  05/31/2015 PET-CT. 06/07/2015 CT abdomen/ pelvis. 08/28/2015 CT pelvis. FINDINGS: NECK No hypermetabolic lymph nodes in the neck. CHEST No hypermetabolic axillary, mediastinal or hilar nodes. No pleural effusions. Stable mild cardiomegaly. Stable left anterior descending and right coronary atherosclerosis. Atherosclerotic nonaneurysmal thoracic aorta. Atrophic appearing thyroid. Stable mildly enlarged 1.5 cm subcarinal node (series 4/image 86) with metabolism below that of the mediastinal blood pool activity, previously 1.6 cm, not appreciably changed in  size. Stable right upper lobe 3 mm pulmonary nodule (series 6/image 22), below PET resolution, probably benign. No acute consolidative airspace disease or new significant pulmonary nodules. ABDOMEN/PELVIS Hypermetabolic 6.3 x 4.4 cm anterior left pelvic mass abutting the left superior bladder wall (series 4/image 187) with max SUV 6.3, previously 7.7 x 5.1 cm with max SUV 5.2, decreased in size and mildly increased in metabolism. Mildly enlarged and mildly hypermetabolic 1.3 cm left retroperitoneal node anterior to the left renal vein (series 4/image 137) with max SUV 5.4, previously 1.4 cm with max SUV 4.9, not appreciably changed in size or metabolism. No additional hypermetabolic lymph nodes in the abdomen or pelvis. Previously described mildly hypermetabolic left external iliac lymph node is non enlarged and non hypermetabolic on today's scan. Partially calcified 4.2 cm mass in the anterior midline far upper abdomen (series 4/image 114) with mild hypermetabolism (max SUV 4.6) previously measured 4.2 cm  with max SUV 4.3 80, not appreciably changed in size or metabolism, favor a benign finding such as fat necrosis and/or old hematoma from prior trauma. No abnormal hypermetabolic activity within the liver, pancreas, adrenal glands, or spleen. Diffuse hepatic steatosis. Cholecystectomy. Small amount of pneumobilia in the left lower lobe is probably due to a history of sphincterotomy. Stable mild splenomegaly. No splenic hypermetabolism. Stable mildly enlarged prostate. Atherosclerotic nonaneurysmal abdominal aorta. SKELETON No focal hypermetabolic activity to suggest skeletal metastasis. Low marrow activity in the lower lumbar spine and sacrum consistent with treatment effect. Mildly increased marrow activity in the remaining spine consistent with mildly reactive marrow state. IMPRESSION: 1. Hypermetabolic anterior left pelvic mass abutting the left superior bladder wall has decreased in size but mildly increased in metabolism, consistent with mild metabolic progression. 2. Stable mildly hypermetabolic mildly enlarged left retroperitoneal lymph node. 3. No residual hypermetabolic lymphadenopathy in the left iliac chains. 4. No new sites of hypermetabolic lymphoma. 5. Stable mildly enlarged non hypermetabolic spleen. Electronically Signed   By: Ilona Sorrel M.D.   On: 01/27/2016 11:29     PATHOLOGY:    ASSESSMENT AND PLAN:  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[place]) Stage II Marginal Zone Lymphoma, S/P Rituxan single-agent weekly x 4 (10/31/2014- 11/21/2014).  Now on maintenance Rituxan every 60 days.   Oncology history is updated.  Labs completed last week: CBC diff, CMET.  I personally reviewed and went over laboratory results with the patient.  The results are noted within this dictation.  Significant hyperglycemia is noted and he is advised to follow-up with his managing physician regarding this issue.    I personally reviewed and went over radiographic studies with the patient.  The results are noted within this  dictation.  PET scan is reviewed in detail with the patient.  Left anterior pelvic mass hypermetabolic activity is increased, BUT has decreased in size.  Activity increase is likely secondary to XRT.  As a result, we will perform a repeat PET in 6 months to further evaluate this area.  If size increases, we have treatment options that we can discuss in detail if needed.  At this time, there is no indication to change treatment course.  We will continue with maintenance Rituxan therapy.  He is advised that if his pelvic mass changes radiographically, we have other treatment options; HOWEVER, at this time, no indication for change in therapy.  He continues to have issues associated with his diabetes including peripheral neuropathy.  Labs in 2 months: CBC diff, CMET, LDH.  Return in 2 months for follow-up and next cycle of  maintenance Rituxan.      ORDERS PLACED FOR THIS ENCOUNTER: Orders Placed This Encounter  Procedures  . NM PET Image Restag (PS) Skull Base To Thigh    MEDICATIONS PRESCRIBED THIS ENCOUNTER: No orders of the defined types were placed in this encounter.    THERAPY PLAN:  Continue with maintenance Rituxan therapy.  All questions were answered. The patient knows to call the clinic with any problems, questions or concerns. We can certainly see the patient much sooner if necessary.  Patient and plan discussed with Dr. Ancil Linsey and she is in agreement with the aforementioned.   This note is electronically signed by: Doy Mince 01/28/2016 6:29 PM

## 2016-01-28 NOTE — Assessment & Plan Note (Addendum)
Stage II Marginal Zone Lymphoma, S/P Rituxan single-agent weekly x 4 (10/31/2014- 11/21/2014).  Now on maintenance Rituxan every 60 days.   Oncology history is updated.  Labs completed last week: CBC diff, CMET.  I personally reviewed and went over laboratory results with the patient.  The results are noted within this dictation.  Significant hyperglycemia is noted and he is advised to follow-up with his managing physician regarding this issue.    I personally reviewed and went over radiographic studies with the patient.  The results are noted within this dictation.  PET scan is reviewed in detail with the patient.  Left anterior pelvic mass hypermetabolic activity is increased, BUT has decreased in size.  Activity increase is likely secondary to XRT.  As a result, we will perform a repeat PET in 6 months to further evaluate this area.  If size increases, we have treatment options that we can discuss in detail if needed.  At this time, there is no indication to change treatment course.  We will continue with maintenance Rituxan therapy.  He is advised that if his pelvic mass changes radiographically, we have other treatment options; HOWEVER, at this time, no indication for change in therapy.  He continues to have issues associated with his diabetes including peripheral neuropathy.  Labs in 2 months: CBC diff, CMET, LDH.  Return in 2 months for follow-up and next cycle of maintenance Rituxan.

## 2016-03-23 ENCOUNTER — Encounter (HOSPITAL_BASED_OUTPATIENT_CLINIC_OR_DEPARTMENT_OTHER): Payer: Medicare HMO

## 2016-03-23 ENCOUNTER — Encounter (HOSPITAL_COMMUNITY): Payer: Self-pay | Admitting: Hematology & Oncology

## 2016-03-23 ENCOUNTER — Encounter (HOSPITAL_COMMUNITY): Payer: Medicare HMO | Attending: Hematology & Oncology | Admitting: Hematology & Oncology

## 2016-03-23 ENCOUNTER — Other Ambulatory Visit (HOSPITAL_COMMUNITY): Payer: Self-pay

## 2016-03-23 VITALS — BP 152/83 | HR 71 | Temp 98.4°F | Resp 18 | Wt 311.2 lb

## 2016-03-23 VITALS — BP 128/69 | HR 69 | Temp 97.4°F | Resp 18

## 2016-03-23 DIAGNOSIS — C8580 Other specified types of non-Hodgkin lymphoma, unspecified site: Secondary | ICD-10-CM

## 2016-03-23 DIAGNOSIS — Z5112 Encounter for antineoplastic immunotherapy: Secondary | ICD-10-CM

## 2016-03-23 DIAGNOSIS — E1165 Type 2 diabetes mellitus with hyperglycemia: Secondary | ICD-10-CM

## 2016-03-23 DIAGNOSIS — R739 Hyperglycemia, unspecified: Secondary | ICD-10-CM

## 2016-03-23 DIAGNOSIS — N189 Chronic kidney disease, unspecified: Secondary | ICD-10-CM

## 2016-03-23 DIAGNOSIS — N184 Chronic kidney disease, stage 4 (severe): Secondary | ICD-10-CM

## 2016-03-23 DIAGNOSIS — D696 Thrombocytopenia, unspecified: Secondary | ICD-10-CM

## 2016-03-23 LAB — CBC WITH DIFFERENTIAL/PLATELET
BASOS PCT: 1 %
Basophils Absolute: 0.1 10*3/uL (ref 0.0–0.1)
EOS ABS: 0.2 10*3/uL (ref 0.0–0.7)
EOS PCT: 3 %
HCT: 47.6 % (ref 39.0–52.0)
HEMOGLOBIN: 16.5 g/dL (ref 13.0–17.0)
LYMPHS ABS: 1.2 10*3/uL (ref 0.7–4.0)
Lymphocytes Relative: 15 %
MCH: 29.9 pg (ref 26.0–34.0)
MCHC: 34.7 g/dL (ref 30.0–36.0)
MCV: 86.2 fL (ref 78.0–100.0)
Monocytes Absolute: 0.6 10*3/uL (ref 0.1–1.0)
Monocytes Relative: 8 %
NEUTROS PCT: 73 %
Neutro Abs: 5.6 10*3/uL (ref 1.7–7.7)
Platelets: 126 10*3/uL — ABNORMAL LOW (ref 150–400)
RBC: 5.52 MIL/uL (ref 4.22–5.81)
RDW: 15 % (ref 11.5–15.5)
WBC: 7.7 10*3/uL (ref 4.0–10.5)

## 2016-03-23 LAB — COMPREHENSIVE METABOLIC PANEL
ALBUMIN: 4 g/dL (ref 3.5–5.0)
ALT: 18 U/L (ref 17–63)
ANION GAP: 8 (ref 5–15)
AST: 13 U/L — ABNORMAL LOW (ref 15–41)
Alkaline Phosphatase: 108 U/L (ref 38–126)
BUN: 40 mg/dL — ABNORMAL HIGH (ref 6–20)
CHLORIDE: 96 mmol/L — AB (ref 101–111)
CO2: 27 mmol/L (ref 22–32)
CREATININE: 2.27 mg/dL — AB (ref 0.61–1.24)
Calcium: 9 mg/dL (ref 8.9–10.3)
GFR calc non Af Amer: 28 mL/min — ABNORMAL LOW (ref >60)
GFR, EST AFRICAN AMERICAN: 32 mL/min — AB (ref >60)
Glucose, Bld: 469 mg/dL — ABNORMAL HIGH (ref 65–99)
POTASSIUM: 3.6 mmol/L (ref 3.5–5.1)
SODIUM: 131 mmol/L — AB (ref 135–145)
Total Bilirubin: 1.1 mg/dL (ref 0.3–1.2)
Total Protein: 7.4 g/dL (ref 6.5–8.1)

## 2016-03-23 LAB — LACTATE DEHYDROGENASE: LDH: 128 U/L (ref 98–192)

## 2016-03-23 MED ORDER — SODIUM CHLORIDE 0.9 % IV SOLN
Freq: Once | INTRAVENOUS | Status: AC
Start: 2016-03-23 — End: 2016-03-23
  Administered 2016-03-23: 10:00:00 via INTRAVENOUS

## 2016-03-23 MED ORDER — SODIUM CHLORIDE 0.9 % IJ SOLN
10.0000 mL | INTRAMUSCULAR | Status: DC | PRN
  Administered 2016-03-23: 10 mL
  Filled 2016-03-23: qty 10

## 2016-03-23 MED ORDER — ACETAMINOPHEN 325 MG PO TABS
650.0000 mg | ORAL_TABLET | Freq: Once | ORAL | Status: AC
  Administered 2016-03-23: 650 mg via ORAL
  Filled 2016-03-23: qty 2

## 2016-03-23 MED ORDER — SODIUM CHLORIDE 0.9 % IV SOLN
375.0000 mg/m2 | Freq: Once | INTRAVENOUS | Status: AC
  Administered 2016-03-23: 1000 mg via INTRAVENOUS
  Filled 2016-03-23: qty 100

## 2016-03-23 MED ORDER — DIPHENHYDRAMINE HCL 25 MG PO CAPS
50.0000 mg | ORAL_CAPSULE | Freq: Once | ORAL | Status: AC
  Administered 2016-03-23: 50 mg via ORAL
  Filled 2016-03-23: qty 2

## 2016-03-23 NOTE — Progress Notes (Signed)
Tolerated rituxan infusion well. Ambulatory on discharge home to self.

## 2016-03-23 NOTE — Progress Notes (Signed)
[place] PROGRESS NOTE  Patient Care Team: Stephens Shire, MD as PCP - General (Family Medicine)  CHIEF COMPLAINTS:  Marginal Zone Lymphoma EGD 09/03/2014 negative for intestinal metaplasia, dysplasia or malignancy H pylori negative Colonoscopy on 09/03/2014 with 6 polyps (tubular adenomas), redundant left colon  Marginal zone lymphoma   Staging form: Lymphoid Neoplasms, AJCC 6th Edition     Clinical stage from 11/07/2014: Stage II - Unsigned    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[place])   07/23/2014 Imaging    CT C/A/P with soft tissue mass in pelvis, LN adjacent to distal esophagus, upper abdomen a partially calcified soft tissue mass      09/27/2014 Initial Biopsy    CT guided biopsy      10/17/2014 PET scan    Hypermetabolic mass left adjacent to the bladder. The mass is mild to moderate in metabolic activity consistent with low-grade lymphoma. Mild left external iliac and common iliac metabolic adenopathy.Single retroperitoneal node anterior to L renal vein      10/31/2014 - 11/21/2014 Chemotherapy    Single Agent Rituxan weekly x 4      01/21/2015 -  Antibody Plan         09/05/2015 - 09/23/2015 Radiation Therapy    Palliative XRT to palvic mass adjacent to bladder, 24 Gy in 12 fractions by Dr. Tammi Klippel      01/27/2016 PET scan    Hypermetabolic anterior left pelvic mass abutting the left superior bladder wall has decreased in size but mildly increased in metabolism, consistent with mild metabolic progression.       HISTORY OF PRESENTING ILLNESS:  [name] [name] 70 y.o. male is here because of Stage II marginal zone lymphoma.  He was admitted to [facility] in December 2015 with abdominal pain. The pain was described as severe. He also reported episodes of vomiting. At presentation he had elevated liver function tests and bilirubin, CT scan of the abdomen and pelvis showed a possible bladder mass abutting the colon.He underwent a colonoscopy on 09/03/2014 with Dr. Oneida Alar. No  mass was noted on exam. 6 polyps were removed all without dysplasia on final pathology, and a redundant left colon was noted. He ultimately underwent a CT-guided biopsy of the pelvic mass on February 25, with final pathology consistent with a low-grade non-Hodgkin's lymphoma, marginal zone type.  Mr. Nair returns to the [place] alone today. He goes by the name of "[name] [name]."  He uses a cane to ambulate. He is here for Cycle #8 Rituximab.   His appetite is good. He denies chest pain or breathing issues. He denies bladder issues. His legs are the same. States, "If it weren't for my knees and feet, I'd feel like I did in my 50s". He has not noticed any new lumps or bumps.   He found out a couple of weeks ago that his brother has cancer in his back, lymph nodes, and top of his lung. He was a heavy smoker.   He denies bowel or bladder issues.    MEDICAL HISTORY:  Past Medical History:  Diagnosis Date  . Cancer (HCC)    low grade non hodgkin's lymphoma  . Chronic kidney disease    stage III kidney disease   . Diabetes mellitus   . Hypothyroidism   . Lymphoma ([place])   . Peripheral vascular disease (HCC)    neuropathy in toes   . Pneumonia    hx of   . S/P biopsy   . Shortness of breath  due ot pain in knees   . Sleep apnea    sleep study 3/29 13 ? location     SURGICAL HISTORY: Past Surgical History:  Procedure Laterality Date  . CHOLECYSTECTOMY  1992  . COLONOSCOPY N/A 09/03/2014   SLF:six colon polyps removed/small internal hemorrhoids  . ESOPHAGOGASTRODUODENOSCOPY N/A 09/03/2014   SLF: mild gastritis/few gastric polyps  . TOTAL KNEE ARTHROPLASTY  11/10/2011   Procedure: TOTAL KNEE ARTHROPLASTY;  Surgeon: Mauri Pole, MD;  Location: [facility];  Service: Orthopedics;  Laterality: Right;    SOCIAL HISTORY: Social History   Social History  . Marital status: Divorced    Spouse name: N/A  . Number of children: N/A  . Years of education: N/A   Occupational History  . Not on  file.   Social History Main Topics  . Smoking status: Former Smoker    Packs/day: 1.00    Years: 30.00    Types: Cigarettes    Quit date: 08/04/2007  . Smokeless tobacco: Never Used  . Alcohol use No  . Drug use: No  . Sexual activity: Yes   Other Topics Concern  . Not on file   Social History Narrative  . No narrative on file    FAMILY HISTORY: Family History  Problem Relation Age of Onset  . Cancer Mother     breast and lung  . Cancer Father     bladder  . Cancer Maternal Uncle     prostate  . Cancer Paternal Uncle     esophagus  . Colon cancer Neg Hx    indicated that the status of his mother is unknown. He indicated that the status of his father is unknown. He indicated that the status of his maternal uncle is unknown. He indicated that the status of his paternal uncle is unknown. He indicated that the status of his neg hx is unknown.    ALLERGIES:  has No Known Allergies.  MEDICATIONS:  Current Outpatient Prescriptions  Medication Sig Dispense Refill  . aspirin 81 MG tablet Take 81 mg by mouth daily.    . Insulin Glargine (LANTUS SOLOSTAR) 100 UNIT/ML Solostar Pen Inject 52 Units into the skin every morning. Pt takes in the AM    . insulin lispro (HUMALOG KWIKPEN) 100 UNIT/ML KiwkPen Inject 3-7 Units into the skin 3 (three) times daily as needed (FOR BLOOD SUGARS).     Marland Kitchen levothyroxine (SYNTHROID, LEVOTHROID) 300 MCG tablet Take 300 mcg by mouth every morning.    Marland Kitchen losartan (COZAAR) 100 MG tablet Take 100 mg by mouth every morning.    . potassium chloride SA (K-DUR,KLOR-CON) 20 MEQ tablet Take 1 tablet (20 mEq total) by mouth daily. 60 tablet 1  . triamcinolone (NASACORT) 55 MCG/ACT AERO nasal inhaler Place 2 sprays into the nose daily as needed (allergies).    Marland Kitchen UNABLE TO FIND Apply 1 application topically as needed. Diabetic skin relief foot cream    . verapamil (CALAN-SR) 180 MG CR tablet Take 180 mg by mouth daily.     No current facility-administered  medications for this visit.     Review of Systems  Constitutional: Negative for fever, chills, weight loss and malaise/fatigue.  HENT: Negative for congestion, hearing loss, nosebleeds, sore throat and tinnitus.   Eyes: Negative for blurred vision, double vision, pain and discharge.  Respiratory: Negative for cough, hemoptysis, sputum production, shortness of breath and wheezing.   Cardiovascular: Negative for chest pain, palpitations, claudication, leg swelling and PND.  Gastrointestinal: Negative for  heartburn, nausea, vomiting, abdominal pain, diarrhea, constipation, blood in stool and melena.  Genitourinary: Negative for dysuria, urgency, frequency and hematuria.  Musculoskeletal: Negative for myalgias, joint pain and falls.  Skin: Negative for itching and rash.  Neurological: Negative for dizziness, tingling, tremors, sensory change, speech change, focal weakness, seizures, loss of consciousness, weakness and headaches.  Endo/Heme/Allergies: Does not bruise/bleed easily.  Psychiatric/Behavioral: Negative for depression, suicidal ideas, memory loss and substance abuse. The patient is not nervous/anxious and does not have insomnia.   14 point review of systems was performed and is negative except as detailed under history of present illness and above   PHYSICAL EXAMINATION: ECOG PERFORMANCE STATUS: 0 - Asymptomatic   Vitals with BMI 03/23/2016  Height   Weight 311 lbs 3 oz  BMI   Systolic 672  Diastolic 83  Pulse 71  Respirations 18    Physical Exam  Constitutional: He is oriented to person, place, and time and well-developed, well-nourished, and in no distress.  Obese  HENT:  Head: Normocephalic and atraumatic.  Nose: Nose normal.  Mouth/Throat: Oropharynx is clear and moist. No oropharyngeal exudate.  Eyes: Conjunctivae and EOM are normal. Pupils are equal, round, and reactive to light. Right eye exhibits no discharge. Left eye exhibits no discharge. No scleral icterus.    Neck: Normal range of motion. Neck supple. No tracheal deviation present. No thyromegaly present.  Cardiovascular: Normal rate, regular rhythm and normal heart sounds.  Exam reveals no gallop and no friction rub.  No murmur heard. Pulmonary/Chest: Effort normal and breath sounds normal. He has no wheezes. He has no rales.  Abdominal: Soft. Bowel sounds are normal. He exhibits no distension and no mass. There is no tenderness. There is no rebound and no guarding.  Musculoskeletal: Normal range of motion. He exhibits no edema.  Chronic LE skin/vascular changes Lymphadenopathy:    He has no cervical adenopathy.  Neurological: He is alert and oriented to person, place, and time. He has normal reflexes. No cranial nerve deficit. Gait normal. Coordination normal.  Skin: Skin is warm and dry. No rash noted.  Psychiatric: Mood, memory, affect and judgment normal.  Nursing note and vitals reviewed.  LABORATORY DATA:  I have reviewed the data as listed Results for SELESTINO, NILA (MRN [id number]) as of 03/23/2016 17:54  Ref. Range 03/23/2016 10:55  Sodium Latest Ref Range: 135 - 145 mmol/L 131 (L)  Potassium Latest Ref Range: 3.5 - 5.1 mmol/L 3.6  Chloride Latest Ref Range: 101 - 111 mmol/L 96 (L)  CO2 Latest Ref Range: 22 - 32 mmol/L 27  BUN Latest Ref Range: 6 - 20 mg/dL 40 (H)  Creatinine Latest Ref Range: 0.61 - 1.24 mg/dL 2.27 (H)  Calcium Latest Ref Range: 8.9 - 10.3 mg/dL 9.0  EGFR (Non-African Amer.) Latest Ref Range: >60 mL/min 28 (L)  EGFR (African American) Latest Ref Range: >60 mL/min 32 (L)  Glucose Latest Ref Range: 65 - 99 mg/dL 469 (H)  Anion gap Latest Ref Range: 5 - 15  8  Alkaline Phosphatase Latest Ref Range: 38 - 126 U/L 108  Albumin Latest Ref Range: 3.5 - 5.0 g/dL 4.0  AST Latest Ref Range: 15 - 41 U/L 13 (L)  ALT Latest Ref Range: 17 - 63 U/L 18  Total Protein Latest Ref Range: 6.5 - 8.1 g/dL 7.4  Total Bilirubin Latest Ref Range: 0.3 - 1.2 mg/dL 1.1  LDH Latest Ref Range:  98 - 192 U/L 128  WBC Latest Ref Range: 4.0 - 10.5 K/uL  7.7  RBC Latest Ref Range: 4.22 - 5.81 MIL/uL 5.52  Hemoglobin Latest Ref Range: 13.0 - 17.0 g/dL 16.5  HCT Latest Ref Range: 39.0 - 52.0 % 47.6  MCV Latest Ref Range: 78.0 - 100.0 fL 86.2  MCH Latest Ref Range: 26.0 - 34.0 pg 29.9  MCHC Latest Ref Range: 30.0 - 36.0 g/dL 34.7  RDW Latest Ref Range: 11.5 - 15.5 % 15.0  Platelets Latest Ref Range: 150 - 400 K/uL 126 (L)  Neutrophils Latest Units: % 73  Lymphocytes Latest Units: % 15  Monocytes Relative Latest Units: % 8  Eosinophil Latest Units: % 3  Basophil Latest Units: % 1  NEUT# Latest Ref Range: 1.7 - 7.7 K/uL 5.6  Lymphocyte # Latest Ref Range: 0.7 - 4.0 K/uL 1.2  Monocyte # Latest Ref Range: 0.1 - 1.0 K/uL 0.6  Eosinophils Absolute Latest Ref Range: 0.0 - 0.7 K/uL 0.2  Basophils Absolute Latest Ref Range: 0.0 - 0.1 K/uL 0.1   PATHOLOGY:  Soft tissue mass, biopsy, adjacent to urinary bladder - ATYPICAL LYMPHOID PROLIFERATION CONSISTENT WITH NON-HODGKIN'S B-CELL LYMPHOMA. - SEE ONCOLOGY TABLE. Histologic type: Non-Hodgkin's lymphoma, see comment. Grade (if applicable): Low grade. Flow cytometry: No tissue is available for analysis since the specimen was received in formalin. Immunohistochemical stains: BCL-6, CD3, CD5, CD10, CD20, CD21, CD34, CD43, CD79a, CD138, Cyclin D-1, kappa, lambda, TdT, and Ki-67 with appropriate controls. Touch preps/imprints: Not performed. Comments: The sections show needle core biopsy of soft tissue displaying a very dense and relatively monomorphic infiltrate of small lymphoid cells with high nuclear cytoplasmic ratio, round to irregular nuclei, dense chromatin, and small to inconspicuous nucleoli. The appearance is diffuse with lack of obvious follicular structures or proliferation centers. No necrosis or conspicuous mitosis is identified. To further evaluate this process, immunohistochemical stains were performed and show that the  overwhelming majority of lymphocytes consist of B-cells, as highlighted with CD20 and CD79a. B-lymphocytes show no significant staining with BCL-6, CD10, CD5, Cyclin D-1, CD34, or TdT. CD21 highlights scattered small foci of dendritic networks throughout the core biopsies. CD138 highlights a minor plasma cell component, which consist of scattered cells and variably sized but predominantly small clusters. Kappa and lambda stains failed to show any significant staining and are considered non-contributory. Ki-67 shows very low expression (less than 5%). There is an admixed minor T-cell component present as seen with CD3, CD5, and CD43. The overall features are consistent with low grade non-Hodgkin's B-cell lymphoma and the overall phenotypic features favor marginal zone type. Clinical correlation is strongly recommended. (BNS:ds 10/01/14)   RADIOGRAPHIC STUDIES: I have personally reviewed the radiological images as listed and agreed with the findings in the report. Study Result   CLINICAL DATA:  Subsequent treatment strategy for marginal zone non-Hodgkin's lymphoma, presenting for restaging.  EXAM: NUCLEAR MEDICINE PET SKULL BASE TO THIGH  TECHNIQUE: 16.0 mCi F-18 FDG was injected intravenously. Full-ring PET imaging was performed from the skull base to thigh after the radiotracer. CT data was obtained and used for attenuation correction and anatomic localization.  FASTING BLOOD GLUCOSE:  Value: 203 mg/dl  COMPARISON:  05/31/2015 PET-CT. 06/07/2015 CT abdomen/ pelvis. 08/28/2015 CT pelvis.  FINDINGS: NECK  No hypermetabolic lymph nodes in the neck.  CHEST  No hypermetabolic axillary, mediastinal or hilar nodes. No pleural effusions. Stable mild cardiomegaly. Stable left anterior descending and right coronary atherosclerosis. Atherosclerotic nonaneurysmal thoracic aorta. Atrophic appearing thyroid. Stable mildly enlarged 1.5 cm subcarinal node (series 4/image 86) with  metabolism below that of the  mediastinal blood pool activity, previously 1.6 cm, not appreciably changed in size. Stable right upper lobe 3 mm pulmonary nodule (series 6/image 22), below PET resolution, probably benign. No acute consolidative airspace disease or new significant pulmonary nodules.  ABDOMEN/PELVIS  Hypermetabolic 6.3 x 4.4 cm anterior left pelvic mass abutting the left superior bladder wall (series 4/image 187) with max SUV 6.3, previously 7.7 x 5.1 cm with max SUV 5.2, decreased in size and mildly increased in metabolism.  Mildly enlarged and mildly hypermetabolic 1.3 cm left retroperitoneal node anterior to the left renal vein (series 4/image 137) with max SUV 5.4, previously 1.4 cm with max SUV 4.9, not appreciably changed in size or metabolism.  No additional hypermetabolic lymph nodes in the abdomen or pelvis. Previously described mildly hypermetabolic left external iliac lymph node is non enlarged and non hypermetabolic on today's scan.  Partially calcified 4.2 cm mass in the anterior midline far upper abdomen (series 4/image 114) with mild hypermetabolism (max SUV 4.6) previously measured 4.2 cm with max SUV 4.3 80, not appreciably changed in size or metabolism, favor a benign finding such as fat necrosis and/or old hematoma from prior trauma.  No abnormal hypermetabolic activity within the liver, pancreas, adrenal glands, or spleen. Diffuse hepatic steatosis. Cholecystectomy. Small amount of pneumobilia in the left lower lobe is probably due to a history of sphincterotomy. Stable mild splenomegaly. No splenic hypermetabolism. Stable mildly enlarged prostate. Atherosclerotic nonaneurysmal abdominal aorta.  SKELETON  No focal hypermetabolic activity to suggest skeletal metastasis. Low marrow activity in the lower lumbar spine and sacrum consistent with treatment effect. Mildly increased marrow activity in the remaining spine consistent with mildly  reactive marrow state.  IMPRESSION: 1. Hypermetabolic anterior left pelvic mass abutting the left superior bladder wall has decreased in size but mildly increased in metabolism, consistent with mild metabolic progression. 2. Stable mildly hypermetabolic mildly enlarged left retroperitoneal lymph node. 3. No residual hypermetabolic lymphadenopathy in the left iliac chains. 4. No new sites of hypermetabolic lymphoma. 5. Stable mildly enlarged non hypermetabolic spleen.   Electronically Signed   By: Ilona Sorrel M.D.   On: 01/27/2016 11:29    ASSESSMENT & PLAN:  Stage II marginal zone lymphoma CKD Mild Thrombocytopenia, intermittent Diabetes Hyperglycemia  He has done well  with single agent Rituxan. We will continue with maintenance to complete 2 years.  I will put him down for follow-up in 2 months.  Thrombocytopenia is mild and stable. CKD is stable.   Hyperglycemia is significant and we discussed this. He has a SS with humalog.  We reviewed the importance of good blood sugar control. He will continue to follow with his PCP.  All questions were answered. The patient knows to call the clinic with any problems, questions or concerns.   This note was electronically signed.   This document serves as a record of services personally performed by Ancil Linsey, MD. It was created on her behalf by Arlyce Harman, a trained medical scribe. The creation of this record is based on the scribe's personal observations and the provider's statements to them. This document has been checked and approved by the attending provider.  I have reviewed the above documentation for accuracy and completeness, and I agree with the above.   K[place]by Fam. Whitney Muse, MD

## 2016-03-23 NOTE — Patient Instructions (Signed)
[facility] Discharge Instructions for Patients Receiving Chemotherapy   Beginning January 23rd 2017 lab work for the Cancer Center will be done in the  Main lab at [facility] on 1st floor. If you have a lab appointment with the Cancer Center please come in thru the  Main Entrance and check in at the main information desk   Today you received the following chemotherapy agents:  Rituxan  If you develop nausea and vomiting, or diarrhea that is not controlled by your medication, call the clinic.  The clinic phone number is [phone]. Office hours are Monday-Friday 8:30am-5:00pm.  BELOW ARE SYMPTOMS THAT SHOULD BE REPORTED IMMEDIATELY:  *FEVER GREATER THAN 101.0 F  *CHILLS WITH OR WITHOUT FEVER  NAUSEA AND VOMITING THAT IS NOT CONTROLLED WITH YOUR NAUSEA MEDICATION  *UNUSUAL SHORTNESS OF BREATH  *UNUSUAL BRUISING OR BLEEDING  TENDERNESS IN MOUTH AND THROAT WITH OR WITHOUT PRESENCE OF ULCERS  *URINARY PROBLEMS  *BOWEL PROBLEMS  UNUSUAL RASH Items with * indicate a potential emergency and should be followed up as soon as possible. If you have an emergency after office hours please contact your primary care physician or go to the nearest emergency department.  Please call the clinic during office hours if you have any questions or concerns.   You may also contact the Patient Navigator at [phone] should you have any questions or need assistance in obtaining follow up care.      Resources For Cancer Patients and their Caregivers ? American Cancer Society: Can assist with transportation, wigs, general needs, runs Look Good Feel Better.        1-888-227-6333 ? Cancer Care: Provides financial assistance, online support groups, medication/co-pay assistance.  1-800-813-HOPE (4673) ? [facility] Assists [place] cancer patients and their families through emotional , educational and financial support.   336-427-4357 ? [place] DSS Where to apply for food stamps, Medicaid and utility assistance. 336-342-1394 ? [facility]: Transportation to medical appointments. 336-347-2287 ? Social Security Administration: May apply for disability if have a Stage IV cancer. 336-342-7796 1-800-772-1213 ? [place] Aging, Disability and Transit Services: Assists with nutrition, care and transit needs. 336-349-2343         

## 2016-03-23 NOTE — Patient Instructions (Signed)
[place] at [facility] Discharge Instructions  RECOMMENDATIONS MADE BY THE CONSULTANT AND ANY TEST RESULTS WILL BE SENT TO YOUR REFERRING PHYSICIAN.  You saw Dr. Whitney Muse today. Return to clinic for labs, follow up and treatment in 2 months.  Thank you for choosing [place] at [facility] to provide your oncology and hematology care.  To afford each patient quality time with our provider, please arrive at least 15 minutes before your scheduled appointment time.   Beginning January 23rd 2017 lab work for the [facility] will be done in the  Main lab at [facility] on 1st floor. If you have a lab appointment with the [facility] please come in thru the  Main Entrance and check in at the main information desk  You need to re-schedule your appointment should you arrive 10 or more minutes late.  We strive to give you quality time with our providers, and arriving late affects you and other patients whose appointments are after yours.  Also, if you no show three or more times for appointments you may be dismissed from the clinic at the providers discretion.     Again, thank you for choosing [facility]  Our hope is that these requests will decrease the amount of time that you wait before being seen by our physicians.       _____________________________________________________________  Should you have questions after your visit to [facility] please contact our office at [phone] between the hours of 8:30 a.m. and 4:30 p.m.  Voicemails left after 4:30 p.m. will not be returned until the following business day.  For prescription refill requests, have your pharmacy contact our office.         Resources For Cancer Patients and their Caregivers ? American Cancer Society: Can assist with transportation, wigs, general needs, runs Look Good Feel Better.        (681)607-8308 ? Cancer Care: Provides financial  assistance, online support groups, medication/co-pay assistance.  1-800-813-HOPE 972-205-2378) ? [facility] Assists [place] cancer patients and their families through emotional , educational and financial support.  580-617-9859 ? [place] DSS Where to apply for food stamps, Medicaid and utility assistance. 210 575 1431 ? [facility]: Transportation to medical appointments. 469-286-7448 ? Social Security Administration: May apply for disability if have a Stage IV cancer. (860) 660-8156 626 765 6668 ? [employer], Disability and Transit Services: Assists with nutrition, care and transit needs. [place] Support Programs: @10RELATIVEDAYS @ > Cancer Support Group  2nd Tuesday of the month 1pm-2pm, Journey Room  > Creative Journey  3rd Tuesday of the month 1130am-1pm, Journey Room  > Look Good Feel Better  1st Wednesday of the month 10am-12 noon, Journey Room (Call [place] to register [phone])

## 2016-04-03 ENCOUNTER — Telehealth (HOSPITAL_COMMUNITY): Payer: Self-pay | Admitting: *Deleted

## 2016-04-03 NOTE — Telephone Encounter (Signed)
Notified patient of his diagnosis with understanding verbalized.

## 2016-05-18 DIAGNOSIS — F5102 Adjustment insomnia: Secondary | ICD-10-CM | POA: Insufficient documentation

## 2016-05-25 ENCOUNTER — Ambulatory Visit (HOSPITAL_COMMUNITY): Payer: Medicare HMO | Admitting: Hematology & Oncology

## 2016-05-25 ENCOUNTER — Encounter (HOSPITAL_COMMUNITY): Payer: Self-pay | Admitting: Oncology

## 2016-05-25 ENCOUNTER — Encounter (HOSPITAL_BASED_OUTPATIENT_CLINIC_OR_DEPARTMENT_OTHER): Payer: Medicare HMO

## 2016-05-25 ENCOUNTER — Encounter (HOSPITAL_COMMUNITY): Payer: Medicare HMO | Attending: Oncology | Admitting: Oncology

## 2016-05-25 ENCOUNTER — Ambulatory Visit (HOSPITAL_COMMUNITY): Payer: Medicare HMO

## 2016-05-25 ENCOUNTER — Other Ambulatory Visit (HOSPITAL_COMMUNITY): Payer: Self-pay | Admitting: Oncology

## 2016-05-25 VITALS — BP 147/69 | HR 60 | Temp 99.4°F | Resp 18 | Wt 311.0 lb

## 2016-05-25 VITALS — BP 127/71 | HR 51 | Temp 97.8°F | Resp 18

## 2016-05-25 DIAGNOSIS — Z5112 Encounter for antineoplastic immunotherapy: Secondary | ICD-10-CM | POA: Diagnosis not present

## 2016-05-25 DIAGNOSIS — N189 Chronic kidney disease, unspecified: Secondary | ICD-10-CM | POA: Diagnosis not present

## 2016-05-25 DIAGNOSIS — C8580 Other specified types of non-Hodgkin lymphoma, unspecified site: Secondary | ICD-10-CM | POA: Insufficient documentation

## 2016-05-25 DIAGNOSIS — E1165 Type 2 diabetes mellitus with hyperglycemia: Secondary | ICD-10-CM | POA: Diagnosis not present

## 2016-05-25 DIAGNOSIS — E669 Obesity, unspecified: Secondary | ICD-10-CM

## 2016-05-25 DIAGNOSIS — N3289 Other specified disorders of bladder: Secondary | ICD-10-CM

## 2016-05-25 DIAGNOSIS — D696 Thrombocytopenia, unspecified: Secondary | ICD-10-CM | POA: Diagnosis not present

## 2016-05-25 LAB — CBC WITH DIFFERENTIAL/PLATELET
BASOS ABS: 0 10*3/uL (ref 0.0–0.1)
BASOS PCT: 0 %
EOS ABS: 0.3 10*3/uL (ref 0.0–0.7)
Eosinophils Relative: 3 %
HCT: 45.1 % (ref 39.0–52.0)
HEMOGLOBIN: 15.8 g/dL (ref 13.0–17.0)
Lymphocytes Relative: 13 %
Lymphs Abs: 1.1 10*3/uL (ref 0.7–4.0)
MCH: 30.6 pg (ref 26.0–34.0)
MCHC: 35 g/dL (ref 30.0–36.0)
MCV: 87.2 fL (ref 78.0–100.0)
MONOS PCT: 8 %
Monocytes Absolute: 0.6 10*3/uL (ref 0.1–1.0)
NEUTROS PCT: 76 %
Neutro Abs: 6.1 10*3/uL (ref 1.7–7.7)
Platelets: 140 10*3/uL — ABNORMAL LOW (ref 150–400)
RBC: 5.17 MIL/uL (ref 4.22–5.81)
RDW: 15.1 % (ref 11.5–15.5)
WBC: 8.1 10*3/uL (ref 4.0–10.5)

## 2016-05-25 LAB — COMPREHENSIVE METABOLIC PANEL
ALBUMIN: 3.8 g/dL (ref 3.5–5.0)
ALK PHOS: 105 U/L (ref 38–126)
ALT: 16 U/L — ABNORMAL LOW (ref 17–63)
ANION GAP: 9 (ref 5–15)
AST: 15 U/L (ref 15–41)
BUN: 42 mg/dL — ABNORMAL HIGH (ref 6–20)
CALCIUM: 9.3 mg/dL (ref 8.9–10.3)
CO2: 26 mmol/L (ref 22–32)
Chloride: 95 mmol/L — ABNORMAL LOW (ref 101–111)
Creatinine, Ser: 2.43 mg/dL — ABNORMAL HIGH (ref 0.61–1.24)
GFR calc Af Amer: 30 mL/min — ABNORMAL LOW (ref >60)
GFR calc non Af Amer: 26 mL/min — ABNORMAL LOW (ref >60)
GLUCOSE: 521 mg/dL — AB (ref 65–99)
POTASSIUM: 3.3 mmol/L — AB (ref 3.5–5.1)
SODIUM: 130 mmol/L — AB (ref 135–145)
Total Bilirubin: 0.9 mg/dL (ref 0.3–1.2)
Total Protein: 7 g/dL (ref 6.5–8.1)

## 2016-05-25 LAB — LACTATE DEHYDROGENASE: LDH: 108 U/L (ref 98–192)

## 2016-05-25 MED ORDER — DIPHENHYDRAMINE HCL 25 MG PO CAPS
50.0000 mg | ORAL_CAPSULE | Freq: Once | ORAL | Status: AC
  Administered 2016-05-25: 50 mg via ORAL
  Filled 2016-05-25: qty 2

## 2016-05-25 MED ORDER — INSULIN ASPART 100 UNIT/ML ~~LOC~~ SOLN
4.0000 [IU] | Freq: Once | SUBCUTANEOUS | Status: AC
  Administered 2016-05-25: 4 [IU] via SUBCUTANEOUS
  Filled 2016-05-25: qty 0.04

## 2016-05-25 MED ORDER — SODIUM CHLORIDE 0.9 % IV SOLN
Freq: Once | INTRAVENOUS | Status: AC
  Administered 2016-05-25: 11:00:00 via INTRAVENOUS

## 2016-05-25 MED ORDER — SODIUM CHLORIDE 0.9 % IJ SOLN: 10.0000 mL | INTRAMUSCULAR | Status: DC | PRN

## 2016-05-25 MED ORDER — ACETAMINOPHEN 325 MG PO TABS
650.0000 mg | ORAL_TABLET | Freq: Once | ORAL | Status: AC
  Administered 2016-05-25: 650 mg via ORAL
  Filled 2016-05-25: qty 2

## 2016-05-25 MED ORDER — SODIUM CHLORIDE 0.9 % IV SOLN
375.0000 mg/m2 | Freq: Once | INTRAVENOUS | Status: AC
  Administered 2016-05-25: 1000 mg via INTRAVENOUS
  Filled 2016-05-25: qty 50

## 2016-05-25 NOTE — Progress Notes (Signed)
[facility] PROGRESS NOTE  Patient Care Team: Stephens Shire, MD as PCP - General (Family Medicine)  CHIEF COMPLAINTS:  Marginal Zone Lymphoma EGD 09/03/2014 negative for intestinal metaplasia, dysplasia or malignancy H pylori negative Colonoscopy on 09/03/2014 with 6 polyps (tubular adenomas), redundant left colon  Marginal zone lymphoma   Staging form: Lymphoid Neoplasms, AJCC 6th Edition     Clinical stage from 11/07/2014: Stage II - Unsigned    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[place])   07/23/2014 Imaging    CT C/A/P with soft tissue mass in pelvis, LN adjacent to distal esophagus, upper abdomen a partially calcified soft tissue mass      09/27/2014 Initial Biopsy    CT guided biopsy      10/17/2014 PET scan    Hypermetabolic mass left adjacent to the bladder. The mass is mild to moderate in metabolic activity consistent with low-grade lymphoma. Mild left external iliac and common iliac metabolic adenopathy.Single retroperitoneal node anterior to L renal vein      10/31/2014 - 11/21/2014 Chemotherapy    Single Agent Rituxan weekly x 4      01/21/2015 -  Antibody Plan         09/05/2015 - 09/23/2015 Radiation Therapy    Palliative XRT to palvic mass adjacent to bladder, 24 Gy in 12 fractions by Dr. Tammi Klippel      01/27/2016 PET scan    Hypermetabolic anterior left pelvic mass abutting the left superior bladder wall has decreased in size but mildly increased in metabolism, consistent with mild metabolic progression.       HISTORY OF PRESENTING ILLNESS:  [name] [name] 70 y.o. male is here because of Stage II marginal zone lymphoma.  He was admitted to [facility] in December 2015 with abdominal pain. The pain was described as severe. He also reported episodes of vomiting. At presentation he had elevated liver function tests and bilirubin, CT scan of the abdomen and pelvis showed a possible bladder mass abutting the colon.He underwent a colonoscopy on 09/03/2014 with Dr. Oneida Alar. No  mass was noted on exam. 6 polyps were removed all without dysplasia on final pathology, and a redundant left colon was noted. He ultimately underwent a CT-guided biopsy of the pelvic mass on February 25, with final pathology consistent with a low-grade non-Hodgkin's lymphoma, marginal zone type.  [name] [name] returns to the [place] alone today. He goes by the name of "[name]."  He uses a cane to ambulate. He is here for Cycle #8 Rituximab.   His appetite is good. He denies chest pain or breathing issues. He denies bladder issues. His legs are the same. States, "If it weren't for my knees and feet, I'd feel like I did in my 50s". He has not noticed any new lumps or bumps.   He found out a couple of weeks ago that his brother has cancer in his back, lymph nodes, and top of his lung. He was a heavy smoker.   He denies bowel or bladder issues.  Patient is here ongoing evaluation and continuation of treatment for his low-grade lymphoma. He has tolerated last treatment very well.  He is on maintenance chemotherapy. He has knee problem.  Patient has quit smoking 7 years ago Patient is somewhat depressed as his younger brother died of lung cancer recently.  MEDICAL HISTORY:  Past Medical History:  Diagnosis Date  . Cancer (HCC)    low grade non hodgkin's lymphoma  . Chronic kidney disease    stage  III kidney disease   . Diabetes mellitus   . Hypothyroidism   . Lymphoma ([place])   . Peripheral vascular disease (HCC)    neuropathy in toes   . Pneumonia    hx of   . S/P biopsy   . Shortness of breath    due ot pain in knees   . Sleep apnea    sleep study 3/29 13 ? location     SURGICAL HISTORY: Past Surgical History:  Procedure Laterality Date  . CHOLECYSTECTOMY  1992  . COLONOSCOPY N/A 09/03/2014   SLF:six colon polyps removed/small internal hemorrhoids  . ESOPHAGOGASTRODUODENOSCOPY N/A 09/03/2014   SLF: mild gastritis/few gastric polyps  . TOTAL KNEE ARTHROPLASTY  11/10/2011   Procedure:  TOTAL KNEE ARTHROPLASTY;  Surgeon: Mauri Pole, MD;  Location: [facility]S;  Service: Orthopedics;  Laterality: Right;    SOCIAL HISTORY: Social History   Social History  . Marital status: Divorced    Spouse name: N/A  . Number of children: N/A  . Years of education: N/A   Occupational History  . Not on file.   Social History Main Topics  . Smoking status: Former Smoker    Packs/day: 1.00    Years: 30.00    Types: Cigarettes    Quit date: 08/04/2007  . Smokeless tobacco: Never Used  . Alcohol use No  . Drug use: No  . Sexual activity: Yes   Other Topics Concern  . Not on file   Social History Narrative  . No narrative on file    FAMILY HISTORY: Family History  Problem Relation Age of Onset  . Cancer Mother     breast and lung  . Cancer Father     bladder  . Cancer Maternal Uncle     prostate  . Cancer Paternal Uncle     esophagus  . Colon cancer Neg Hx    indicated that the status of his mother is unknown. He indicated that the status of his father is unknown. He indicated that the status of his maternal uncle is unknown. He indicated that the status of his paternal uncle is unknown. He indicated that the status of his neg hx is unknown.    ALLERGIES:  has No Known Allergies.  MEDICATIONS:  Current Outpatient Prescriptions  Medication Sig Dispense Refill  . aspirin 81 MG tablet Take 81 mg by mouth daily.    . Insulin Glargine (LANTUS SOLOSTAR) 100 UNIT/ML Solostar Pen Inject 52 Units into the skin every morning. Pt takes in the AM    . insulin lispro (HUMALOG KWIKPEN) 100 UNIT/ML KiwkPen Inject 3-7 Units into the skin 3 (three) times daily as needed (FOR BLOOD SUGARS).     Marland Kitchen levothyroxine (SYNTHROID, LEVOTHROID) 300 MCG tablet Take 300 mcg by mouth every morning.    Marland Kitchen losartan (COZAAR) 100 MG tablet Take 100 mg by mouth every morning.    . potassium chloride SA (K-DUR,KLOR-CON) 20 MEQ tablet Take 1 tablet (20 mEq total) by mouth daily. 60 tablet 1  .  triamcinolone (NASACORT) 55 MCG/ACT AERO nasal inhaler Place 2 sprays into the nose daily as needed (allergies).    Marland Kitchen UNABLE TO FIND Apply 1 application topically as needed. Diabetic skin relief foot cream    . verapamil (CALAN-SR) 180 MG CR tablet Take 180 mg by mouth daily.     No current facility-administered medications for this visit.     Review of Systems  Constitutional: Negative for fever, chills, weight loss and malaise/fatigue.  HENT: Negative for congestion, hearing loss, nosebleeds, sore throat and tinnitus.   Eyes: Negative for blurred vision, double vision, pain and discharge.  Respiratory: Negative for cough, hemoptysis, sputum production, shortness of breath and wheezing.   Cardiovascular: Negative for chest pain, palpitations, claudication, leg swelling and PND.  Gastrointestinal: Negative for heartburn, nausea, vomiting, abdominal pain, diarrhea, constipation, blood in stool and melena.  Genitourinary: Negative for dysuria, urgency, frequency and hematuria.  Musculoskeletal: Negative for myalgias, joint pain and falls.  Skin: Negative for itching and rash.  Neurological: Negative for dizziness, tingling, tremors, sensory change, speech change, focal weakness, seizures, loss of consciousness, weakness and headaches.  Endo/Heme/Allergies: Does not bruise/bleed easily.  Psychiatric/Behavioral: Negative for depression, suicidal ideas, memory loss and substance abuse. The patient is not nervous/anxious and does not have insomnia.   14 point review of systems was performed and is negative except as detailed under history of present illness and above   PHYSICAL EXAMINATION: ECOG PERFORMANCE STATUS: 0 - Asymptomatic   Blood pressure (!) 147/69, pulse 60, temperature 99.4 F (37.4 C), temperature source Oral, resp. rate 18, weight (!) 311 lb (141.1 kg), SpO2 97 %.   Physical Exam  Constitutional: He is oriented to person, place, and time and well-developed, well-nourished, and  in no distress.  Obese  HENT:  Head: Normocephalic and atraumatic.  Nose: Nose normal.  Mouth/Throat: Oropharynx is clear and moist. No oropharyngeal exudate.  Eyes: Conjunctivae and EOM are normal. Pupils are equal, round, and reactive to light. Right eye exhibits no discharge. Left eye exhibits no discharge. No scleral icterus.  Neck: Normal range of motion. Neck supple. No tracheal deviation present. No thyromegaly present.  Cardiovascular: Normal rate, regular rhythm and normal heart sounds.  Exam reveals no gallop and no friction rub.  No murmur heard. Pulmonary/Chest: Effort normal and breath sounds normal. He has no wheezes. He has no rales.  Abdominal: Soft. Bowel sounds are normal. He exhibits no distension and no mass. There is no tenderness. There is no rebound and no guarding.  Musculoskeletal: Normal range of motion. He exhibits no edema.  Chronic LE skin/vascular changes Lymphadenopathy:    He has no cervical adenopathy.  Neurological: He is alert and oriented to person, place, and time. He has normal reflexes. No cranial nerve deficit. Gait normal. Coordination normal.  Skin: Skin is warm and dry. No rash noted.  Psychiatric: Mood, memory, affect and judgment normal.  Nursing note and vitals reviewed.  LABORATORY DATA:  Lab data is pending and will be reviewed prior to chemotherapy   PATHOLOGY:  Soft tissue mass, biopsy, adjacent to urinary bladder - ATYPICAL LYMPHOID PROLIFERATION CONSISTENT WITH NON-HODGKIN'S B-CELL LYMPHOMA. - SEE ONCOLOGY TABLE. Histologic type: Non-Hodgkin's lymphoma, see comment. Grade (if applicable): Low grade. Flow cytometry: No tissue is available for analysis since the specimen was received in formalin. Immunohistochemical stains: BCL-6, CD3, CD5, CD10, CD20, CD21, CD34, CD43, CD79a, CD138, Cyclin D-1, kappa, lambda, TdT, and Ki-67 with appropriate controls. Touch preps/imprints: Not performed. Comments: The sections show needle core  biopsy of soft tissue displaying a very dense and relatively monomorphic infiltrate of small lymphoid cells with high nuclear cytoplasmic ratio, round to irregular nuclei, dense chromatin, and small to inconspicuous nucleoli. The appearance is diffuse with lack of obvious follicular structures or proliferation centers. No necrosis or conspicuous mitosis is identified. To further evaluate this process, immunohistochemical stains were performed and show that the overwhelming majority of lymphocytes consist of B-cells, as highlighted with CD20 and CD79a. B-lymphocytes show  no significant staining with BCL-6, CD10, CD5, Cyclin D-1, CD34, or TdT. CD21 highlights scattered small foci of dendritic networks throughout the core biopsies. CD138 highlights a minor plasma cell component, which consist of scattered cells and variably sized but predominantly small clusters. Kappa and lambda stains failed to show any significant staining and are considered non-contributory. Ki-67 shows very low expression (less than 5%). There is an admixed minor T-cell component present as seen with CD3, CD5, and CD43. The overall features are consistent with low grade non-Hodgkin's B-cell lymphoma and the overall phenotypic features favor marginal zone type. Clinical correlation is strongly recommended. (BNS:ds 10/01/14)   RADIOGRAPHIC STUDIES: I have personally reviewed the radiological images as listed and agreed with the findings in the report. Study Result   CLINICAL DATA:  Subsequent treatment strategy for marginal zone non-Hodgkin's lymphoma, presenting for restaging.  EXAM: NUCLEAR MEDICINE PET SKULL BASE TO THIGH  TECHNIQUE: 16.0 mCi F-18 FDG was injected intravenously. Full-ring PET imaging was performed from the skull base to thigh after the radiotracer. CT data was obtained and used for attenuation correction and anatomic localization.  FASTING BLOOD GLUCOSE:  Value: 203 mg/dl  COMPARISON:   05/31/2015 PET-CT. 06/07/2015 CT abdomen/ pelvis. 08/28/2015 CT pelvis.  FINDINGS: NECK  No hypermetabolic lymph nodes in the neck.  CHEST  No hypermetabolic axillary, mediastinal or hilar nodes. No pleural effusions. Stable mild cardiomegaly. Stable left anterior descending and right coronary atherosclerosis. Atherosclerotic nonaneurysmal thoracic aorta. Atrophic appearing thyroid. Stable mildly enlarged 1.5 cm subcarinal node (series 4/image 86) with metabolism below that of the mediastinal blood pool activity, previously 1.6 cm, not appreciably changed in size. Stable right upper lobe 3 mm pulmonary nodule (series 6/image 22), below PET resolution, probably benign. No acute consolidative airspace disease or new significant pulmonary nodules.  ABDOMEN/PELVIS  Hypermetabolic 6.3 x 4.4 cm anterior left pelvic mass abutting the left superior bladder wall (series 4/image 187) with max SUV 6.3, previously 7.7 x 5.1 cm with max SUV 5.2, decreased in size and mildly increased in metabolism.  Mildly enlarged and mildly hypermetabolic 1.3 cm left retroperitoneal node anterior to the left renal vein (series 4/image 137) with max SUV 5.4, previously 1.4 cm with max SUV 4.9, not appreciably changed in size or metabolism.  No additional hypermetabolic lymph nodes in the abdomen or pelvis. Previously described mildly hypermetabolic left external iliac lymph node is non enlarged and non hypermetabolic on today's scan.  Partially calcified 4.2 cm mass in the anterior midline far upper abdomen (series 4/image 114) with mild hypermetabolism (max SUV 4.6) previously measured 4.2 cm with max SUV 4.3 80, not appreciably changed in size or metabolism, favor a benign finding such as fat necrosis and/or old hematoma from prior trauma.  No abnormal hypermetabolic activity within the liver, pancreas, adrenal glands, or spleen. Diffuse hepatic steatosis. Cholecystectomy. Small amount of  pneumobilia in the left lower lobe is probably due to a history of sphincterotomy. Stable mild splenomegaly. No splenic hypermetabolism. Stable mildly enlarged prostate. Atherosclerotic nonaneurysmal abdominal aorta.  SKELETON  No focal hypermetabolic activity to suggest skeletal metastasis. Low marrow activity in the lower lumbar spine and sacrum consistent with treatment effect. Mildly increased marrow activity in the remaining spine consistent with mildly reactive marrow state.  IMPRESSION: 1. Hypermetabolic anterior left pelvic mass abutting the left superior bladder wall has decreased in size but mildly increased in metabolism, consistent with mild metabolic progression. 2. Stable mildly hypermetabolic mildly enlarged left retroperitoneal lymph node. 3. No residual hypermetabolic lymphadenopathy in  the left iliac chains. 4. No new sites of hypermetabolic lymphoma. 5. Stable mildly enlarged non hypermetabolic spleen.   Electronically Signed   By: Ilona Sorrel M.D.   On: 01/27/2016 11:29    ASSESSMENT & PLAN:  Stage II marginal zone lymphoma CKD Mild Thrombocytopenia, intermittent Diabetes Hyperglycemia.  (Patient is checking blood sugar on a regular interval and managing with her meter and regular insulin) Obesity

## 2016-05-25 NOTE — Progress Notes (Signed)
Tolerated infusion w/o adverse reaction.  Alert, in no distress.  VSS.  Discharged ambulatory.  

## 2016-05-25 NOTE — Addendum Note (Signed)
Addended by: Jaynie Collins R on: 05/25/2016 10:58 AM   Modules accepted: Orders

## 2016-05-25 NOTE — Progress Notes (Signed)
CRITICAL VALUE ALERT Critical value received:  Glucose-521 Date of notification:  05/25/16 Time of notification: 6979 Critical value read back:  Yes.   Nurse who received alert:  M.Marque Rademaker, LPN MD notified (1st page):  Kirby Crigler, PA-C

## 2016-05-25 NOTE — Patient Instructions (Signed)
[facility] Discharge Instructions for Patients Receiving Chemotherapy   Beginning January 23rd 2017 lab work for the [facility] will be done in the  Main lab at [facility] on 1st floor. If you have a lab appointment with the [facility] please come in thru the  Main Entrance and check in at the main information desk   Today you received the following chemotherapy agents:  Rituxan  If you develop nausea and vomiting, or diarrhea that is not controlled by your medication, call the clinic.  The clinic phone number is [phone]. Office hours are Monday-Friday 8:30am-5:00pm.  BELOW ARE SYMPTOMS THAT SHOULD BE REPORTED IMMEDIATELY:  *FEVER GREATER THAN 101.0 F  *CHILLS WITH OR WITHOUT FEVER  NAUSEA AND VOMITING THAT IS NOT CONTROLLED WITH YOUR NAUSEA MEDICATION  *UNUSUAL SHORTNESS OF BREATH  *UNUSUAL BRUISING OR BLEEDING  TENDERNESS IN MOUTH AND THROAT WITH OR WITHOUT PRESENCE OF ULCERS  *URINARY PROBLEMS  *BOWEL PROBLEMS  UNUSUAL RASH Items with * indicate a potential emergency and should be followed up as soon as possible. If you have an emergency after office hours please contact your primary care physician or go to the nearest emergency department.  Please call the clinic during office hours if you have any questions or concerns.   You may also contact the Patient Navigator at [phone] should you have any questions or need assistance in obtaining follow up care.      Resources For Cancer Patients and their Caregivers ? American Cancer Society: Can assist with transportation, wigs, general needs, runs Look Good Feel Better.        (657)673-8885 ? Cancer Care: Provides financial assistance, online support groups, medication/co-pay assistance.  1-800-813-HOPE 7267867401) ? [place] Assists [place] cancer patients and their families through emotional , educational and financial support.   (502) 781-0066 ? [place] DSS Where to apply for food stamps, Medicaid and utility assistance. 815-334-1354 ? [facility]: Transportation to medical appointments. 510 845 7497 ? Social Security Administration: May apply for disability if have a Stage IV cancer. 854-368-3862 236-326-2844 ? [employer], Disability and Transit Services: Assists with nutrition, care and transit needs. 703-753-7312

## 2016-05-25 NOTE — Progress Notes (Signed)
Patient's lab data was available for review. Mild thrombocytopenia. Blood sugar was more than 500 so insulin was given 4 units subcutaneous patient had insulin this morning Serum creatinine is high so patient cannot get CT scan of abdomen and pelvis with contrast Patient does not want PET scan because of high co-pay CT scan of abdomen and pelvis without contrast did not give enough information and we may have to go with PET scanning

## 2016-05-25 NOTE — Patient Instructions (Addendum)
[facility] Discharge Instructions  RECOMMENDATIONS MADE BY THE CONSULTANT AND ANY TEST RESULTS WILL BE SENT TO YOUR REFERRING PHYSICIAN.  You saw Dr. Oliva Bustard in place of Dr. Whitney Muse today. Treatment today. Follow up with MD in 2 months with treatment and labs. PET scan will be changed to CT scan.  Thank you for choosing [facility] at [facility] to provide your oncology and hematology care.  To afford each patient quality time with our provider, please arrive at least 15 minutes before your scheduled appointment time.   Beginning January 23rd 2017 lab work for the [facility] will be done in the  Main lab at [facility] on 1st floor. If you have a lab appointment with the [facility] please come in thru the  Main Entrance and check in at the main information desk  You need to re-schedule your appointment should you arrive 10 or more minutes late.  We strive to give you quality time with our providers, and arriving late affects you and other patients whose appointments are after yours.  Also, if you no show three or more times for appointments you may be dismissed from the clinic at the providers discretion.     Again, thank you for choosing [facility].  Our hope is that these requests will decrease the amount of time that you wait before being seen by our physicians.       _____________________________________________________________  Should you have questions after your visit to [facility], please contact our office at [phone] between the hours of 8:30 a.m. and 4:30 p.m.  Voicemails left after 4:30 p.m. will not be returned until the following business day.  For prescription refill requests, have your pharmacy contact our office.         Resources For Cancer Patients and their Caregivers ? American Cancer Society: Can assist with transportation, wigs, general needs, runs Look Good Feel  Better.        416-196-7084 ? Cancer Care: Provides financial assistance, online support groups, medication/co-pay assistance.  1-800-813-HOPE (458)813-4880) ? [place] Assists [place] cancer patients and their families through emotional , educational and financial support.  915-120-5117 ? [place] DSS Where to apply for food stamps, Medicaid and utility assistance. 626-026-9050 ? [facility]: Transportation to medical appointments. 7654788209 ? Social Security Administration: May apply for disability if have a Stage IV cancer. (938) 804-3459 (260)615-1293 ? [employer], Disability and Transit Services: Assists with nutrition, care and transit needs. [place] Support Programs: @10RELATIVEDAYS @ > Cancer Support Group  2nd Tuesday of the month 1pm-2pm, Journey Room  > Creative Journey  3rd Tuesday of the month 1130am-1pm, Journey Room  > Look Good Feel Better  1st Wednesday of the month 10am-12 noon, Journey Room (Call [facility] to register [phone])

## 2016-07-23 ENCOUNTER — Ambulatory Visit (HOSPITAL_COMMUNITY): Payer: Medicare HMO

## 2016-07-23 ENCOUNTER — Other Ambulatory Visit (HOSPITAL_COMMUNITY): Payer: Medicare HMO

## 2016-07-24 ENCOUNTER — Inpatient Hospital Stay (HOSPITAL_COMMUNITY)
Admission: EM | Admit: 2016-07-24 | Discharge: 2016-07-28 | DRG: 872 | Disposition: A | Discharge status: Home / Self Care | Payer: Medicare HMO | Attending: Internal Medicine | Admitting: Internal Medicine

## 2016-07-24 ENCOUNTER — Ambulatory Visit (HOSPITAL_COMMUNITY): Payer: Medicare HMO

## 2016-07-24 ENCOUNTER — Emergency Department (HOSPITAL_COMMUNITY): Payer: Medicare HMO

## 2016-07-24 ENCOUNTER — Other Ambulatory Visit: Payer: Self-pay

## 2016-07-24 ENCOUNTER — Encounter (HOSPITAL_COMMUNITY): Payer: Self-pay | Admitting: Emergency Medicine

## 2016-07-24 DIAGNOSIS — A419 Sepsis, unspecified organism: Secondary | ICD-10-CM | POA: Diagnosis present

## 2016-07-24 DIAGNOSIS — C8590 Non-Hodgkin lymphoma, unspecified, unspecified site: Secondary | ICD-10-CM | POA: Diagnosis present

## 2016-07-24 DIAGNOSIS — Z96651 Presence of right artificial knee joint: Secondary | ICD-10-CM | POA: Diagnosis present

## 2016-07-24 DIAGNOSIS — G4730 Sleep apnea, unspecified: Secondary | ICD-10-CM | POA: Diagnosis present

## 2016-07-24 DIAGNOSIS — E669 Obesity, unspecified: Secondary | ICD-10-CM | POA: Diagnosis present

## 2016-07-24 DIAGNOSIS — Z7951 Long term (current) use of inhaled steroids: Secondary | ICD-10-CM | POA: Diagnosis not present

## 2016-07-24 DIAGNOSIS — E876 Hypokalemia: Secondary | ICD-10-CM | POA: Diagnosis present

## 2016-07-24 DIAGNOSIS — E860 Dehydration: Secondary | ICD-10-CM | POA: Diagnosis present

## 2016-07-24 DIAGNOSIS — E1165 Type 2 diabetes mellitus with hyperglycemia: Secondary | ICD-10-CM | POA: Diagnosis present

## 2016-07-24 DIAGNOSIS — Z87891 Personal history of nicotine dependence: Secondary | ICD-10-CM

## 2016-07-24 DIAGNOSIS — Z794 Long term (current) use of insulin: Secondary | ICD-10-CM | POA: Diagnosis not present

## 2016-07-24 DIAGNOSIS — R748 Abnormal levels of other serum enzymes: Secondary | ICD-10-CM | POA: Diagnosis not present

## 2016-07-24 DIAGNOSIS — E1151 Type 2 diabetes mellitus with diabetic peripheral angiopathy without gangrene: Secondary | ICD-10-CM | POA: Diagnosis present

## 2016-07-24 DIAGNOSIS — Z6841 Body Mass Index (BMI) 40.0 and over, adult: Secondary | ICD-10-CM

## 2016-07-24 DIAGNOSIS — E1122 Type 2 diabetes mellitus with diabetic chronic kidney disease: Secondary | ICD-10-CM

## 2016-07-24 DIAGNOSIS — R778 Other specified abnormalities of plasma proteins: Secondary | ICD-10-CM | POA: Diagnosis present

## 2016-07-24 DIAGNOSIS — E1140 Type 2 diabetes mellitus with diabetic neuropathy, unspecified: Secondary | ICD-10-CM | POA: Diagnosis present

## 2016-07-24 DIAGNOSIS — N189 Chronic kidney disease, unspecified: Secondary | ICD-10-CM

## 2016-07-24 DIAGNOSIS — E872 Acidosis, unspecified: Secondary | ICD-10-CM | POA: Diagnosis present

## 2016-07-24 DIAGNOSIS — N183 Chronic kidney disease, stage 3 (moderate): Secondary | ICD-10-CM | POA: Diagnosis present

## 2016-07-24 DIAGNOSIS — N179 Acute kidney failure, unspecified: Secondary | ICD-10-CM | POA: Diagnosis present

## 2016-07-24 DIAGNOSIS — E039 Hypothyroidism, unspecified: Secondary | ICD-10-CM | POA: Diagnosis present

## 2016-07-24 DIAGNOSIS — Z7982 Long term (current) use of aspirin: Secondary | ICD-10-CM | POA: Diagnosis not present

## 2016-07-24 DIAGNOSIS — R1110 Vomiting, unspecified: Secondary | ICD-10-CM | POA: Diagnosis not present

## 2016-07-24 DIAGNOSIS — I248 Other forms of acute ischemic heart disease: Secondary | ICD-10-CM | POA: Diagnosis present

## 2016-07-24 DIAGNOSIS — R739 Hyperglycemia, unspecified: Secondary | ICD-10-CM

## 2016-07-24 DIAGNOSIS — R7989 Other specified abnormal findings of blood chemistry: Secondary | ICD-10-CM | POA: Diagnosis present

## 2016-07-24 DIAGNOSIS — Z9049 Acquired absence of other specified parts of digestive tract: Secondary | ICD-10-CM | POA: Diagnosis not present

## 2016-07-24 DIAGNOSIS — I129 Hypertensive chronic kidney disease with stage 1 through stage 4 chronic kidney disease, or unspecified chronic kidney disease: Secondary | ICD-10-CM | POA: Diagnosis present

## 2016-07-24 DIAGNOSIS — N184 Chronic kidney disease, stage 4 (severe): Secondary | ICD-10-CM

## 2016-07-24 DIAGNOSIS — R509 Fever, unspecified: Secondary | ICD-10-CM | POA: Diagnosis present

## 2016-07-24 LAB — URINALYSIS, ROUTINE W REFLEX MICROSCOPIC
Bilirubin Urine: NEGATIVE
Glucose, UA: >=500 mg/dL — AB
KETONES UR: NEGATIVE mg/dL
LEUKOCYTES UA: NEGATIVE
Nitrite: NEGATIVE
PROTEIN: 30 mg/dL — AB
Specific Gravity, Urine: 1.012 (ref 1.005–1.030)
pH: 6 (ref 5.0–8.0)

## 2016-07-24 LAB — CBC WITH DIFFERENTIAL/PLATELET
BASOS PCT: 0 %
Basophils Absolute: 0 10*3/uL (ref 0.0–0.1)
EOS ABS: 0 10*3/uL (ref 0.0–0.7)
Eosinophils Relative: 0 %
HCT: 47.8 % (ref 39.0–52.0)
HEMOGLOBIN: 16.4 g/dL (ref 13.0–17.0)
Lymphocytes Relative: 3 %
Lymphs Abs: 0.3 10*3/uL — ABNORMAL LOW (ref 0.7–4.0)
MCH: 30.7 pg (ref 26.0–34.0)
MCHC: 34.3 g/dL (ref 30.0–36.0)
MCV: 89.5 fL (ref 78.0–100.0)
Monocytes Absolute: 0.7 10*3/uL (ref 0.1–1.0)
Monocytes Relative: 6 %
NEUTROS PCT: 91 %
Neutro Abs: 11.4 10*3/uL — ABNORMAL HIGH (ref 1.7–7.7)
PLATELETS: 130 10*3/uL — AB (ref 150–400)
RBC: 5.34 MIL/uL (ref 4.22–5.81)
RDW: 15.1 % (ref 11.5–15.5)
WBC: 12.5 10*3/uL — AB (ref 4.0–10.5)

## 2016-07-24 LAB — COMPREHENSIVE METABOLIC PANEL
ALK PHOS: 103 U/L (ref 38–126)
ALT: 52 U/L (ref 17–63)
AST: 77 U/L — ABNORMAL HIGH (ref 15–41)
Albumin: 3.7 g/dL (ref 3.5–5.0)
Anion gap: 17 — ABNORMAL HIGH (ref 5–15)
BILIRUBIN TOTAL: 6 mg/dL — AB (ref 0.3–1.2)
BUN: 40 mg/dL — ABNORMAL HIGH (ref 6–20)
CALCIUM: 9.3 mg/dL (ref 8.9–10.3)
CO2: 23 mmol/L (ref 22–32)
CREATININE: 3.39 mg/dL — AB (ref 0.61–1.24)
Chloride: 95 mmol/L — ABNORMAL LOW (ref 101–111)
GFR, EST AFRICAN AMERICAN: 20 mL/min — AB (ref >60)
GFR, EST NON AFRICAN AMERICAN: 17 mL/min — AB (ref >60)
Glucose, Bld: 449 mg/dL — ABNORMAL HIGH (ref 65–99)
Potassium: 2.7 mmol/L — CL (ref 3.5–5.1)
Sodium: 135 mmol/L (ref 135–145)
TOTAL PROTEIN: 7 g/dL (ref 6.5–8.1)

## 2016-07-24 LAB — BASIC METABOLIC PANEL
Anion gap: 14 (ref 5–15)
BUN: 44 mg/dL — AB (ref 6–20)
CALCIUM: 8 mg/dL — AB (ref 8.9–10.3)
CHLORIDE: 100 mmol/L — AB (ref 101–111)
CO2: 21 mmol/L — AB (ref 22–32)
CREATININE: 3.73 mg/dL — AB (ref 0.61–1.24)
GFR calc Af Amer: 18 mL/min — ABNORMAL LOW (ref >60)
GFR calc non Af Amer: 15 mL/min — ABNORMAL LOW (ref >60)
Glucose, Bld: 341 mg/dL — ABNORMAL HIGH (ref 65–99)
Potassium: 3.9 mmol/L (ref 3.5–5.1)
SODIUM: 135 mmol/L (ref 135–145)

## 2016-07-24 LAB — GLUCOSE, CAPILLARY
Glucose-Capillary: 349 mg/dL — ABNORMAL HIGH (ref 65–99)
Glucose-Capillary: 329 mg/dL — ABNORMAL HIGH (ref 65–99)

## 2016-07-24 LAB — INFLUENZA PANEL BY PCR (TYPE A & B)
Influenza A By PCR: NEGATIVE
Influenza B By PCR: NEGATIVE

## 2016-07-24 LAB — I-STAT CG4 LACTIC ACID, ED
LACTIC ACID, VENOUS: 6.6 mmol/L — AB (ref 0.5–1.9)
Lactic Acid, Venous: 7.66 mmol/L (ref 0.5–1.9)

## 2016-07-24 LAB — LACTIC ACID, PLASMA: Lactic Acid, Venous: 5.5 mmol/L (ref 0.5–1.9)

## 2016-07-24 LAB — TROPONIN I
TROPONIN I: 0.06 ng/mL — AB (ref <0.03)
Troponin I: 0.06 ng/mL (ref <0.03)

## 2016-07-24 LAB — CBG MONITORING, ED
Glucose-Capillary: 452 mg/dL — ABNORMAL HIGH (ref 65–99)
Glucose-Capillary: 393 mg/dL — ABNORMAL HIGH (ref 65–99)

## 2016-07-24 LAB — MRSA PCR SCREENING: MRSA BY PCR: NEGATIVE

## 2016-07-24 MED ORDER — FLUTICASONE PROPIONATE 50 MCG/ACT NA SUSP
1.0000 | Freq: Every day | NASAL | Status: DC
  Administered 2016-07-25 – 2016-07-28 (×4): 1 via NASAL
  Filled 2016-07-24: qty 32
  Filled 2016-07-24 (×2): qty 16

## 2016-07-24 MED ORDER — SODIUM CHLORIDE 0.9 % IV BOLUS (SEPSIS)
500.0000 mL | Freq: Once | INTRAVENOUS | Status: AC
  Administered 2016-07-24: 500 mL via INTRAVENOUS

## 2016-07-24 MED ORDER — SODIUM CHLORIDE 0.9 % IV BOLUS (SEPSIS)
1000.0000 mL | Freq: Once | INTRAVENOUS | Status: AC
  Administered 2016-07-24: 1000 mL via INTRAVENOUS

## 2016-07-24 MED ORDER — VANCOMYCIN HCL 10 G IV SOLR
1750.0000 mg | INTRAVENOUS | Status: DC
  Administered 2016-07-25: 1750 mg via INTRAVENOUS
  Filled 2016-07-24 (×5): qty 1750

## 2016-07-24 MED ORDER — INSULIN DEGLUDEC 100 UNIT/ML ~~LOC~~ SOPN: 35.0000 [IU] | PEN_INJECTOR | Freq: Every day | SUBCUTANEOUS | Status: DC

## 2016-07-24 MED ORDER — ONDANSETRON HCL 4 MG PO TABS: 4.0000 mg | ORAL_TABLET | Freq: Four times a day (QID) | ORAL | Status: DC | PRN

## 2016-07-24 MED ORDER — ACETAMINOPHEN 650 MG RE SUPP: 650.0000 mg | Freq: Four times a day (QID) | RECTAL | Status: DC | PRN

## 2016-07-24 MED ORDER — INSULIN ASPART 100 UNIT/ML ~~LOC~~ SOLN
0.0000 [IU] | Freq: Three times a day (TID) | SUBCUTANEOUS | Status: DC
  Administered 2016-07-24: 15 [IU] via SUBCUTANEOUS
  Administered 2016-07-25: 7 [IU] via SUBCUTANEOUS
  Administered 2016-07-25 (×2): 4 [IU] via SUBCUTANEOUS
  Administered 2016-07-26: 7 [IU] via SUBCUTANEOUS
  Administered 2016-07-26: 4 [IU] via SUBCUTANEOUS
  Administered 2016-07-26: 7 [IU] via SUBCUTANEOUS
  Administered 2016-07-27 (×3): 4 [IU] via SUBCUTANEOUS
  Administered 2016-07-28: 11 [IU] via SUBCUTANEOUS
  Administered 2016-07-28: 4 [IU] via SUBCUTANEOUS

## 2016-07-24 MED ORDER — VANCOMYCIN HCL IN DEXTROSE 1-5 GM/200ML-% IV SOLN: 1000.0000 mg | Freq: Once | INTRAVENOUS | Status: DC

## 2016-07-24 MED ORDER — ASPIRIN 81 MG PO CHEW
81.0000 mg | CHEWABLE_TABLET | Freq: Every day | ORAL | Status: DC
  Administered 2016-07-24 – 2016-07-28 (×5): 81 mg via ORAL
  Filled 2016-07-24 (×5): qty 1

## 2016-07-24 MED ORDER — INSULIN GLARGINE 100 UNIT/ML ~~LOC~~ SOLN
35.0000 [IU] | Freq: Every day | SUBCUTANEOUS | Status: DC
  Administered 2016-07-24 – 2016-07-27 (×4): 35 [IU] via SUBCUTANEOUS
  Filled 2016-07-24 (×5): qty 0.35

## 2016-07-24 MED ORDER — LEVOTHYROXINE SODIUM 100 MCG PO TABS
200.0000 ug | ORAL_TABLET | Freq: Every day | ORAL | Status: DC
  Administered 2016-07-25 – 2016-07-28 (×4): 200 ug via ORAL
  Filled 2016-07-24 (×4): qty 2

## 2016-07-24 MED ORDER — POTASSIUM CHLORIDE CRYS ER 20 MEQ PO TBCR
40.0000 meq | EXTENDED_RELEASE_TABLET | Freq: Three times a day (TID) | ORAL | Status: DC
  Administered 2016-07-24 – 2016-07-27 (×11): 40 meq via ORAL
  Filled 2016-07-24 (×11): qty 2

## 2016-07-24 MED ORDER — LEVOTHYROXINE SODIUM 100 MCG PO TABS
100.0000 ug | ORAL_TABLET | Freq: Every day | ORAL | Status: DC
  Administered 2016-07-25 – 2016-07-28 (×4): 100 ug via ORAL
  Filled 2016-07-24 (×4): qty 1

## 2016-07-24 MED ORDER — PIPERACILLIN-TAZOBACTAM 3.375 G IVPB
3.3750 g | Freq: Three times a day (TID) | INTRAVENOUS | Status: DC
  Administered 2016-07-24 – 2016-07-27 (×8): 3.375 g via INTRAVENOUS
  Filled 2016-07-24 (×8): qty 50

## 2016-07-24 MED ORDER — VANCOMYCIN HCL 10 G IV SOLR
2500.0000 mg | Freq: Once | INTRAVENOUS | Status: AC
  Administered 2016-07-24: 2500 mg via INTRAVENOUS
  Filled 2016-07-24: qty 2500

## 2016-07-24 MED ORDER — ONDANSETRON HCL 4 MG/2ML IJ SOLN
4.0000 mg | Freq: Four times a day (QID) | INTRAMUSCULAR | Status: DC | PRN
  Administered 2016-07-27: 4 mg via INTRAVENOUS
  Filled 2016-07-24: qty 2

## 2016-07-24 MED ORDER — INSULIN ASPART 100 UNIT/ML ~~LOC~~ SOLN
0.0000 [IU] | Freq: Every day | SUBCUTANEOUS | Status: DC
  Administered 2016-07-24: 4 [IU] via SUBCUTANEOUS
  Administered 2016-07-25: 2 [IU] via SUBCUTANEOUS

## 2016-07-24 MED ORDER — ACETAMINOPHEN 325 MG PO TABS
650.0000 mg | ORAL_TABLET | Freq: Four times a day (QID) | ORAL | Status: DC | PRN
  Administered 2016-07-25 – 2016-07-27 (×2): 650 mg via ORAL
  Filled 2016-07-24 (×3): qty 2

## 2016-07-24 MED ORDER — POTASSIUM CHLORIDE 10 MEQ/100ML IV SOLN
10.0000 meq | INTRAVENOUS | Status: AC
  Administered 2016-07-24 (×3): 10 meq via INTRAVENOUS
  Filled 2016-07-24 (×3): qty 100

## 2016-07-24 MED ORDER — PIPERACILLIN-TAZOBACTAM 3.375 G IVPB 30 MIN
3.3750 g | Freq: Once | INTRAVENOUS | Status: AC
  Administered 2016-07-24: 3.375 g via INTRAVENOUS
  Filled 2016-07-24: qty 50

## 2016-07-24 MED ORDER — ENOXAPARIN SODIUM 80 MG/0.8ML ~~LOC~~ SOLN
70.0000 mg | SUBCUTANEOUS | Status: DC
  Administered 2016-07-24 – 2016-07-26 (×3): 70 mg via SUBCUTANEOUS
  Filled 2016-07-24 (×3): qty 0.8

## 2016-07-24 NOTE — ED Notes (Addendum)
1st blood culture drawn from right hand at 1205 by phlebotomist.

## 2016-07-24 NOTE — ED Notes (Signed)
CRITICAL VALUE ALERT  Critical value received:  Potassium 2.7, Troponin 0.06  Date of notification:  07/24/16  Time of notification:  5750  Critical value read back:Yes.    Nurse who received alert:  Norm Salt, RN  MD notified (1st page):  Dr. Alvino Chapel  Time of first page:  1253  MD notified (2nd page):  Time of second page:  Responding MD:  Dr. Alvino Chapel  Time MD responded:  1256

## 2016-07-24 NOTE — ED Notes (Signed)
ED Provider at bedside. 

## 2016-07-24 NOTE — ED Notes (Signed)
Dr. Alvino Chapel made aware of lab results of Lactic Acid: 6.6

## 2016-07-24 NOTE — ED Notes (Signed)
Attempted to call report, RN unavailable.

## 2016-07-24 NOTE — ED Provider Notes (Signed)
[facility] DEPT Provider Note   CSN: [id number] Arrival date & time: 07/24/16  1127  By signing my name below, I, Charolotte Eke, attest that this documentation has been prepared under the direction and in the presence of Davonna Belling, MD. Electronically Signed: Charolotte Eke, Scribe. 07/24/16. 11:51 AM.   History   Chief Complaint Chief Complaint  Patient presents with  . Altered Mental Status   LEVEL 5 CAVEAT: HPI and ROS limited due to altered mental status.   HPI Comments: [name] [name] is a 70 y.o. male brought in by ambulance with h/o of DM who presents to the Emergency Department with altered mental status that began this morning. EMS reported a CBG of 471 en route. Pt states that he began to feel unwell last night with GI upset. He states that his appetite has been normal. He denies eating gross amounts of sugar. Pt also denies chest pain, SOB, fever, and chills.   The history is provided by the patient. The history is limited by the condition of the patient. No language interpreter was used.    Past Medical History:  Diagnosis Date  . Cancer (HCC)    low grade non hodgkin's lymphoma  . Chronic kidney disease    stage III kidney disease   . Diabetes mellitus   . Hypothyroidism   . Lymphoma ([place])   . Peripheral vascular disease (HCC)    neuropathy in toes   . Pneumonia    hx of   . S/P biopsy   . Shortness of breath    due ot pain in knees   . Sleep apnea    sleep study 3/29 13 ? location     Patient Active Problem List   Diagnosis Date Noted  . AKI (acute kidney injury) ([place]) 05/14/2015  . Diarrhea 05/14/2015  . Dehydration 05/14/2015  . Hypokalemia 05/14/2015  . 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 Pelvic mass in male   . Varices, esophageal ([place])   . Colonic mass   . Abnormal CT scan, pelvis   . Bladder mass   . Elevated liver enzymes   . Elevated LFTs   . Abdominal pain 07/23/2014  . Chronic kidney disease 07/23/2014  . Morbid obesity due  to excess calories ([place]) 07/23/2014  . Diabetes ([place]) 07/23/2014  . Hypertension 07/23/2014  . S/P right TKA 11/11/2011    Past Surgical History:  Procedure Laterality Date  . CHOLECYSTECTOMY  1992  . COLONOSCOPY N/A 09/03/2014   SLF:six colon polyps removed/small internal hemorrhoids  . ESOPHAGOGASTRODUODENOSCOPY N/A 09/03/2014   SLF: mild gastritis/few gastric polyps  . TOTAL KNEE ARTHROPLASTY  11/10/2011   Procedure: TOTAL KNEE ARTHROPLASTY;  Surgeon: Mauri Pole, MD;  Location: [facility];  Service: Orthopedics;  Laterality: Right;       Home Medications    Prior to Admission medications   Medication Sig Start Date End Date Taking? Authorizing Provider  aspirin 81 MG tablet Take 81 mg by mouth daily.   Yes Historical Provider, MD  fluticasone (FLONASE) 50 MCG/ACT nasal spray Place 1 spray into both nostrils daily.   Yes Historical Provider, MD  gabapentin (NEURONTIN) 100 MG capsule Take 100 mg by mouth at bedtime.   Yes Historical Provider, MD  insulin aspart (NOVOLOG FLEXPEN) 100 UNIT/ML FlexPen Inject into the skin. As needed for blood sugar   Yes Historical Provider, MD  insulin degludec (TRESIBA FLEXTOUCH) 100 UNIT/ML SOPN FlexTouch Pen Inject 65 Units into the skin daily at 10 pm.  Yes Historical Provider, MD  levothyroxine (SYNTHROID, LEVOTHROID) 100 MCG tablet Take 100 mcg by mouth daily before breakfast.   Yes Historical Provider, MD  levothyroxine (SYNTHROID, LEVOTHROID) 200 MCG tablet Take 200 mcg by mouth daily before breakfast.   Yes Historical Provider, MD  losartan-hydrochlorothiazide (HYZAAR) 100-25 MG tablet Take 1 tablet by mouth daily.   Yes Historical Provider, MD  meloxicam (MOBIC) 7.5 MG tablet Take 7.5 mg by mouth daily.   Yes Historical Provider, MD  traZODone (DESYREL) 50 MG tablet Take 50-100 mg by mouth at bedtime.   Yes Historical Provider, MD  verapamil (CALAN-SR) 240 MG CR tablet Take 240 mg by mouth daily.   Yes Historical Provider, MD    Family  History Family History  Problem Relation Age of Onset  . Cancer Mother     breast and lung  . Cancer Father     bladder  . Cancer Maternal Uncle     prostate  . Cancer Paternal Uncle     esophagus  . Colon cancer Neg Hx     Social History Social History  Substance Use Topics  . Smoking status: Former Smoker    Packs/day: 1.00    Years: 30.00    Types: Cigarettes    Quit date: 08/04/2007  . Smokeless tobacco: Never Used  . Alcohol use No     Allergies   Patient has no known allergies.   Review of Systems Review of Systems  Unable to perform ROS: Mental status change     Physical Exam Updated Vital Signs BP 103/57   Pulse 102   Temp 101.6 F (38.7 C) (Rectal)   Resp 23   Ht 6' (1.829 m)   Wt (!) 315 lb (142.9 kg)   SpO2 97%   BMI 42.72 kg/m   Physical Exam  Constitutional:  Somnolent, but arousable to stimuli. Patient is obese  HENT:  Head: Normocephalic and atraumatic.  Mouth/Throat: Mucous membranes are dry.  Dry and cracked mucous membranes.  Eyes: Pupils are equal, round, and reactive to light.  Cardiovascular:  Tachycardia  Pulmonary/Chest: Effort normal and breath sounds normal. No respiratory distress. He has no wheezes. He has no rales.  Abdominal: Soft. He exhibits no distension and no mass. There is no tenderness. There is no guarding.  Musculoskeletal: He exhibits no edema.  No peripheral edema.   Neurological:  Somewhat somnolent but arousable stimuli.  Skin: Skin is warm and dry. Capillary refill takes less than 2 seconds.  Nursing note and vitals reviewed.    ED Treatments / Results   DIAGNOSTIC STUDIES: Oxygen Saturation is 88% on room air, low by my interpretation.    COORDINATION OF CARE: 11:38 AM Will order labs, CXR.     Labs (all labs ordered are listed, but only abnormal results are displayed) Labs Reviewed  COMPREHENSIVE METABOLIC PANEL - Abnormal; Notable for the following:       Result Value   Potassium 2.7 (*)     Chloride 95 (*)    Glucose, Bld 449 (*)    BUN 40 (*)    Creatinine, Ser 3.39 (*)    AST 77 (*)    Total Bilirubin 6.0 (*)    GFR calc non Af Amer 17 (*)    GFR calc Af Amer 20 (*)    Anion gap 17 (*)    All other components within normal limits  CBC WITH DIFFERENTIAL/PLATELET - Abnormal; Notable for the following:    WBC 12.5 (*)  Platelets 130 (*)    Neutro Abs 11.4 (*)    Lymphs Abs 0.3 (*)    All other components within normal limits  URINALYSIS, ROUTINE W REFLEX MICROSCOPIC - Abnormal; Notable for the following:    Color, Urine AMBER (*)    APPearance HAZY (*)    Glucose, UA >=500 (*)    Hgb urine dipstick SMALL (*)    Protein, ur 30 (*)    Bacteria, UA RARE (*)    All other components within normal limits  TROPONIN I - Abnormal; Notable for the following:    Troponin I 0.06 (*)    All other components within normal limits  CBG MONITORING, ED - Abnormal; Notable for the following:    Glucose-Capillary 452 (*)    All other components within normal limits  CBG MONITORING, ED - Abnormal; Notable for the following:    Glucose-Capillary 393 (*)    All other components within normal limits  I-STAT CG4 LACTIC ACID, ED - Abnormal; Notable for the following:    Lactic Acid, Venous 7.66 (*)    All other components within normal limits  CULTURE, BLOOD (ROUTINE X 2)  CULTURE, BLOOD (ROUTINE X 2)  URINE CULTURE  INFLUENZA PANEL BY PCR (TYPE A & B, H1N1)  CBG MONITORING, ED  I-STAT CG4 LACTIC ACID, ED    EKG  EKG Interpretation  Date/Time:  Friday July 24 2016 11:28:37 EST Ventricular Rate:  113 PR Interval:    QRS Duration: 159 QT Interval:  399 QTC Calculation: 548 R Axis:   -69 Text Interpretation:  Sinus or ectopic atrial tachycardia Left bundle branch block Confirmed by Alvino Chapel  MD, Avik Leoni [phone]) on 07/24/2016 11:31:16 AM       Radiology Dg Chest Portable 1 View  Result Date: 07/24/2016 CLINICAL DATA:  Lethargic. Ex-smoker. Non-Hodgkin's  lymphoma. Fever. EXAM: PORTABLE CHEST 1 VIEW COMPARISON:  01/27/2016 PET. Most recent plain film 07/23/2014 acute abdomen series. FINDINGS: Mildly degraded exam due to AP portable technique and patient body habitus. Moderate right hemidiaphragm elevation. Midline trachea. Cardiomegaly accentuated by AP portable technique. Apparent superior mediastinal soft tissue fullness is most likely due to AP portable technique and patient size. No definite pleural fluid. No pneumothorax. Pulmonary interstitial prominence is at least partially due to low lung volumes. No well-defined lobar consolidation. IMPRESSION: Multifactorial degradation, including patient body habitus and AP portable technique. Right hemidiaphragm elevation with low lung volumes and right base volume loss. Cardiomegaly with mild pulmonary interstitial prominence. Primarily felt to be secondary low lung volumes and AP portable technique. Cannot exclude mild pulmonary venous congestion. Electronically Signed   By: Abigail Miyamoto M.D.   On: 07/24/2016 12:08    Procedures Procedures (including critical care time)  Medications Ordered in ED Medications  sodium chloride 0.9 % bolus 500 mL (500 mLs Intravenous New Bag/Given 07/24/16 1428)  potassium chloride 10 mEq in 100 mL IVPB (10 mEq Intravenous New Bag/Given 07/24/16 1426)  piperacillin-tazobactam (ZOSYN) IVPB 3.375 g (0 g Intravenous Stopped 07/24/16 1256)  sodium chloride 0.9 % bolus 1,000 mL (0 mLs Intravenous Stopped 07/24/16 1256)  vancomycin (VANCOCIN) 2,500 mg in sodium chloride 0.9 % 500 mL IVPB (2,500 mg Intravenous New Bag/Given 07/24/16 1215)  sodium chloride 0.9 % bolus 1,000 mL (0 mLs Intravenous Stopped 07/24/16 1352)  sodium chloride 0.9 % bolus 1,000 mL (1,000 mLs Intravenous New Bag/Given 07/24/16 1352)  sodium chloride 0.9 % bolus 1,000 mL (0 mLs Intravenous Stopped 07/24/16 1430)     Initial Impression /  Assessment and Plan / ED Course  I have reviewed the triage vital  signs and the nursing notes.  Pertinent labs & imaging results that were available during my care of the patient were reviewed by me and considered in my medical decision making (see chart for details).  Clinical Course     Patient presents with fever tachycardia and altered mental status. Has had nausea vomiting. Potentially the source. Mental status improved after IV fluids. Sugar is 400 and has an anion gap of 17. Has lactic acid of 7.6. Also febrile but without hypotension. Urine reassuring. Chest x-ray somewhat limited by body habitus and positioning but no clear pneumonia. Negative flu test. Initial 30/kg fluid bolus limited somewhat by possibility that the patient's elevated lactic acid was due to nonseptic cause such as hyperglycemia and DKA. Patient has had Zosyn and vancomycin. 30/kg bolus. Will admit to stepdown and internal medicine. Lactic acid due to be repeated.  CRITICAL CARE Performed by: Mackie Pai Total critical care time: 40 minutes Critical care time was exclusive of separately billable procedures and treating other patients. Critical care was necessary to treat or prevent imminent or life-threatening deterioration. Critical care was time spent personally by me on the following activities: development of treatment plan with patient and/or surrogate as well as nursing, discussions with consultants, evaluation of patient's response to treatment, examination of patient, obtaining history from patient or surrogate, ordering and performing treatments and interventions, ordering and review of laboratory studies, ordering and review of radiographic studies, pulse oximetry and re-evaluation of patient's condition.   Final Clinical Impressions(s) / ED Diagnoses   Final diagnoses:  Sepsis, due to unspecified organism ([place])  Hyperglycemia  AKI (acute kidney injury) ([place])    New Prescriptions New Prescriptions   No medications on file   I personally performed the  services described in this documentation, which was scribed in my presence. The recorded information has been reviewed and is accurate.      Davonna Belling, MD 07/24/16 1459

## 2016-07-24 NOTE — Progress Notes (Signed)
Patient ID: [name] [name], male   DOB: [birth year], 70 y.o.   MRN: [id number]  Sepsis - Repeat Assessment  Performed at:    7062  Vitals     Blood pressure 103/61, pulse 101, temperature 101.6 F (38.7 C), temperature source Rectal, resp. rate (!) 33, height 6' (1.829 m), weight (!) 142.9 kg (315 lb), SpO2 94 %.  Heart:     Regular rate and rhythm  Lungs:    CTA  Capillary Refill:   <2 sec  Peripheral Pulse:   Radial pulse palpable and Dorsalis pedis pulse  palpable  Skin:     Pale and Diaphoretic

## 2016-07-24 NOTE — ED Triage Notes (Signed)
PT brought in by [facility] today for lethargy and increased CBG that started this am. PT able to be aroused with verbal stimulation. PT denies any pain. CBG with EMS 471 and had tachypnea.

## 2016-07-24 NOTE — ED Notes (Signed)
2nd blood culture drawn from left a/c at 1214 by phlebotomist.

## 2016-07-24 NOTE — Progress Notes (Signed)
Pharmacy Antibiotic Note  [name] [name] is a 70 y.o. male admitted on 07/24/2016 with sepsis.  Pharmacy has been consulted for vancomycin and zosyn dosing.  Plan: Vancomycin 2500 mg IV x 1 then 1750 mg IV q24 hours Zosyn 3.375 gm IV q8 hours F/u renal function, cultures and clinical course  Height: 6' (182.9 cm) Weight: (!) 315 lb (142.9 kg) IBW/kg (Calculated) : 77.6  Temp (24hrs), Avg:101.6 F (38.7 C), Min:101.6 F (38.7 C), Max:101.6 F (38.7 C)   Recent Labs Lab 07/24/16 1144 07/24/16 1158  WBC 12.5*  --   LATICACIDVEN  --  7.66*    CrCl cannot be calculated (Patient's most recent lab result is older than the maximum 21 days allowed.).    No Known Allergies  Antimicrobials this admission: vanc 12/22  >>  zosyn 12/22 >>   Thank you for allowing pharmacy to be a part of this patient's care.  [name] 07/24/2016 12:46 PM

## 2016-07-24 NOTE — H&P (Signed)
History and Physical  [name] [name] HER:[id number] DOB: September 12, 1945 DOA: 07/24/2016  Referring physician: Alvino Chapel, ED physician PCP: Stephens Shire, MD  Outpatient Specialists: none  Chief Complaint: Vomiting  HPI: [name] [name] is a 70 y.o. male with a history of runny kidney disease, non-Hodgkin's lymphoma, diabetes type 2, hypothyroidism, neuropathy, obesity, sleep apnea. Patient presents with confusion. He was brought in by EMS due to lethargy and increased blood sugars. Patient was able to be rales with verbal stimulation. Patient is still quite confused, but is able to provide some information. Patient has been vomiting over the weekend - scribes emesis as stomach contents. His symptoms are slightly improving. No abdominal pain. Found to have a fever here. Oral intake exacerbated symptoms. No palliating factors.  Emergency Department Course: Patient started on broad-spectrum antibiotics after blood cultures were obtained. Due to hypokalemia, patient was started on potassium. Lactic acid was elevated at 7 with recheck 3 hours later at 6.6. Influenza was negative.  Review of Systems:   Pt denies any chills, constipation, abdominal pain, shortness of breath, dyspnea on exertion, orthopnea, cough, wheezing, palpitations, headache, vision changes, lightheadedness, dizziness, melena, rectal bleeding.  Review of systems are otherwise negative  Past Medical History:  Diagnosis Date  . Cancer (HCC)    low grade non hodgkin's lymphoma  . Chronic kidney disease    stage III kidney disease   . Diabetes mellitus   . Hypothyroidism   . Lymphoma ([place])   . Peripheral vascular disease (HCC)    neuropathy in toes   . Pneumonia    hx of   . S/P biopsy   . Shortness of breath    due ot pain in knees   . Sleep apnea    sleep study 3/29 13 ? location    Past Surgical History:  Procedure Laterality Date  . CHOLECYSTECTOMY  1992  . COLONOSCOPY N/A 09/03/2014   SLF:six colon polyps removed/small  internal hemorrhoids  . ESOPHAGOGASTRODUODENOSCOPY N/A 09/03/2014   SLF: mild gastritis/few gastric polyps  . TOTAL KNEE ARTHROPLASTY  11/10/2011   Procedure: TOTAL KNEE ARTHROPLASTY;  Surgeon: Mauri Pole, MD;  Location: [facility];  Service: Orthopedics;  Laterality: Right;   Social History:  reports that he quit smoking about 8 years ago. His smoking use included Cigarettes. He has a 30.00 pack-year smoking history. He has never used smokeless tobacco. He reports that he does not drink alcohol or use drugs. Patient lives at Home  No Known Allergies  Family History  Problem Relation Age of Onset  . Cancer Mother     breast and lung  . Cancer Father     bladder  . Cancer Maternal Uncle     prostate  . Cancer Paternal Uncle     esophagus  . Colon cancer Neg Hx      Prior to Admission medications   Medication Sig Start Date End Date Taking? Authorizing Provider  aspirin 81 MG tablet Take 81 mg by mouth daily.   Yes Historical Provider, MD  fluticasone (FLONASE) 50 MCG/ACT nasal spray Place 1 spray into both nostrils daily.   Yes Historical Provider, MD  gabapentin (NEURONTIN) 100 MG capsule Take 100 mg by mouth at bedtime.   Yes Historical Provider, MD  insulin aspart (NOVOLOG FLEXPEN) 100 UNIT/ML FlexPen Inject into the skin. As needed for blood sugar   Yes Historical Provider, MD  insulin degludec (TRESIBA FLEXTOUCH) 100 UNIT/ML SOPN FlexTouch Pen Inject 65 Units into the skin daily at 10 pm.  Yes Historical Provider, MD  levothyroxine (SYNTHROID, LEVOTHROID) 100 MCG tablet Take 100 mcg by mouth daily before breakfast.   Yes Historical Provider, MD  levothyroxine (SYNTHROID, LEVOTHROID) 200 MCG tablet Take 200 mcg by mouth daily before breakfast.   Yes Historical Provider, MD  losartan-hydrochlorothiazide (HYZAAR) 100-25 MG tablet Take 1 tablet by mouth daily.   Yes Historical Provider, MD  meloxicam (MOBIC) 7.5 MG tablet Take 7.5 mg by mouth daily.   Yes Historical Provider, MD    traZODone (DESYREL) 50 MG tablet Take 50-100 mg by mouth at bedtime.   Yes Historical Provider, MD  verapamil (CALAN-SR) 240 MG CR tablet Take 240 mg by mouth daily.   Yes Historical Provider, MD    Physical Exam: BP 103/61   Pulse 101   Temp 101.6 F (38.7 C) (Rectal)   Resp (!) 33   Ht 6' (1.829 m)   Wt (!) 142.9 kg (315 lb)   SpO2 94%   BMI 42.72 kg/m   General: Older Caucasian male. Awake and alert and oriented x3. No acute cardiopulmonary distress.  HEENT: Normocephalic atraumatic.  Right and left ears normal in appearance.  Pupils equal, round, reactive to light. Extraocular muscles are intact. Sclerae anicteric and noninjected.  Dry mucosal membranes. No mucosal lesions.  Neck: Neck supple without lymphadenopathy. No carotid bruits. No masses palpated.  Cardiovascular: Regular rate with normal S1-S2 sounds. No murmurs, rubs, gallops auscultated. No JVD.  Respiratory: Tachypneic. Good respiratory effort with no wheezes, rales, rhonchi. Lungs clear to auscultation bilaterally.  No accessory muscle use. Abdomen: Soft, nontender, nondistended. Active bowel sounds. No masses or hepatosplenomegaly  Skin: No rashes, lesions, or ulcerations.  Dry, warm to touch. 2+ dorsalis pedis and radial pulses. Musculoskeletal: No calf or leg pain. All major joints not erythematous nontender.  No upper or lower joint deformation.  Good ROM.  No contractures  Psychiatric: Intact judgment and insight. Pleasant and cooperative. Neurologic: No focal neurological deficits. Strength is 5/5 and symmetric in upper and lower extremities.  Cranial nerves II through XII are grossly intact.           Labs on Admission: I have personally reviewed following labs and imaging studies  CBC:  Recent Labs Lab 07/24/16 1144  WBC 12.5*  NEUTROABS 11.4*  HGB 16.4  HCT 47.8  MCV 89.5  PLT 782*   Basic Metabolic Panel:  Recent Labs Lab 07/24/16 1144  NA 135  K 2.7*  CL 95*  CO2 23  GLUCOSE 449*   BUN 40*  CREATININE 3.39*  CALCIUM 9.3   GFR: Estimated Creatinine Clearance: 30.2 mL/min (by C-G formula based on SCr of 3.39 mg/dL (H)). Liver Function Tests:  Recent Labs Lab 07/24/16 1144  AST 77*  ALT 52  ALKPHOS 103  BILITOT 6.0*  PROT 7.0  ALBUMIN 3.7   No results for input(s): LIPASE, AMYLASE in the last 168 hours. No results for input(s): AMMONIA in the last 168 hours. Coagulation Profile: No results for input(s): INR, PROTIME in the last 168 hours. Cardiac Enzymes:  Recent Labs Lab 07/24/16 1144  TROPONINI 0.06*   BNP (last 3 results) No results for input(s): PROBNP in the last 8760 hours. HbA1C: No results for input(s): HGBA1C in the last 72 hours. CBG:  Recent Labs Lab 07/24/16 1129 07/24/16 1417  GLUCAP 452* 393*   Lipid Profile: No results for input(s): CHOL, HDL, LDLCALC, TRIG, CHOLHDL, LDLDIRECT in the last 72 hours. Thyroid Function Tests: No results for input(s): TSH, T4TOTAL, FREET4, T3FREE,  THYROIDAB in the last 72 hours. Anemia Panel: No results for input(s): VITAMINB12, FOLATE, FERRITIN, TIBC, IRON, RETICCTPCT in the last 72 hours. Urine analysis:    Component Value Date/Time   COLORURINE AMBER (A) 07/24/2016 1300   APPEARANCEUR HAZY (A) 07/24/2016 1300   LABSPEC 1.012 07/24/2016 1300   PHURINE 6.0 07/24/2016 1300   GLUCOSEU >=500 (A) 07/24/2016 1300   HGBUR SMALL (A) 07/24/2016 1300   BILIRUBINUR NEGATIVE 07/24/2016 1300   KETONESUR NEGATIVE 07/24/2016 1300   PROTEINUR 30 (A) 07/24/2016 1300   UROBILINOGEN 4.0 (H) 07/23/2014 1356   NITRITE NEGATIVE 07/24/2016 1300   LEUKOCYTESUR NEGATIVE 07/24/2016 1300   Sepsis Labs: @LABRCNTIP (procalcitonin:4,lacticidven:4) ) Recent Results (from the past 240 hour(s))  Blood Culture (routine x 2)     Status: None (Preliminary result)   Collection Time: 07/24/16 12:03 PM  Result Value Ref Range Status   Specimen Description BLOOD RIGHT HAND  Final   Special Requests BOTTLES DRAWN AEROBIC  AND ANAEROBIC [facility] EACH  Final   Culture PENDING  Incomplete   Report Status PENDING  Incomplete  Blood Culture (routine x 2)     Status: None (Preliminary result)   Collection Time: 07/24/16 12:11 PM  Result Value Ref Range Status   Specimen Description LEFT ANTECUBITAL  Final   Special Requests BOTTLES DRAWN AEROBIC AND ANAEROBIC [facility] EACH  Final   Culture PENDING  Incomplete   Report Status PENDING  Incomplete     Radiological Exams on Admission: Dg Chest Portable 1 View  Result Date: 07/24/2016 CLINICAL DATA:  Lethargic. Ex-smoker. Non-Hodgkin's lymphoma. Fever. EXAM: PORTABLE CHEST 1 VIEW COMPARISON:  01/27/2016 PET. Most recent plain film 07/23/2014 acute abdomen series. FINDINGS: Mildly degraded exam due to AP portable technique and patient body habitus. Moderate right hemidiaphragm elevation. Midline trachea. Cardiomegaly accentuated by AP portable technique. Apparent superior mediastinal soft tissue fullness is most likely due to AP portable technique and patient size. No definite pleural fluid. No pneumothorax. Pulmonary interstitial prominence is at least partially due to low lung volumes. No well-defined lobar consolidation. IMPRESSION: Multifactorial degradation, including patient body habitus and AP portable technique. Right hemidiaphragm elevation with low lung volumes and right base volume loss. Cardiomegaly with mild pulmonary interstitial prominence. Primarily felt to be secondary low lung volumes and AP portable technique. Cannot exclude mild pulmonary venous congestion. Electronically Signed   By: Abigail Miyamoto M.D.   On: 07/24/2016 12:08    EKG: Independently reviewed. Sinus tachycardia with left bundle branch block. No acute ST changes.  Assessment/Plan: Principal Problem:   Sepsis ([place]) Active Problems:   Diabetes ([place])   Acute on chronic kidney failure (HCC)   Hypokalemia   Elevated troponin I level   Fever   Lactic acidosis    This patient was discussed with the  ED physician, including pertinent vitals, physical exam findings, labs, and imaging.  We also discussed care given by the ED provider.   #1 sepsis  Patient fluid resuscitated  Broad-spectrum antibiotics  Cultures done  Recheck CBC in the morning #2 lactic acidosis  We'll continue to trend lactic acid #3 fever  Likely secondary to gastroenteritis. No other focal signs of illness at this point #4 hyperglycemia with diabetes  Improved after fluid hydration. We'll continue sliding scale insulin. As patient is vomiting, will half his normal evening insulin dose  If this continues to increase, we'll start insulin drip #5 hypokalemia  Will replace potassium #6 elevated troponin I level  Likely due to sepsis  We'll trend #7  acute on chronic kidney failure  Fluid hydrated  Likely secondary to #1  Recheck creatinine in the morning #8 hypertension  Pressure still a little soft. We'll hold antihypertensives  DVT prophylaxis: Lovenox Consultants: None Code Status: Full code Family Communication: [name] [name] and [name] [name] in the room  Disposition Plan: Patient should be returned home following admission   Truett Mainland, DO [facility] Pager [phone]  If 7PM-7AM, please contact night-coverage [email or web address] Password [email or web address]

## 2016-07-25 ENCOUNTER — Inpatient Hospital Stay (HOSPITAL_COMMUNITY): Payer: Medicare HMO

## 2016-07-25 DIAGNOSIS — N183 Chronic kidney disease, stage 3 (moderate): Secondary | ICD-10-CM

## 2016-07-25 LAB — CBC
HCT: 38.2 % — ABNORMAL LOW (ref 39.0–52.0)
Hemoglobin: 13 g/dL (ref 13.0–17.0)
MCH: 30.4 pg (ref 26.0–34.0)
MCHC: 34 g/dL (ref 30.0–36.0)
MCV: 89.3 fL (ref 78.0–100.0)
Platelets: 98 10*3/uL — ABNORMAL LOW (ref 150–400)
RBC: 4.28 MIL/uL (ref 4.22–5.81)
RDW: 15.6 % — AB (ref 11.5–15.5)
WBC: 10.7 10*3/uL — ABNORMAL HIGH (ref 4.0–10.5)

## 2016-07-25 LAB — BASIC METABOLIC PANEL
ANION GAP: 10 (ref 5–15)
BUN: 46 mg/dL — AB (ref 6–20)
CALCIUM: 7.6 mg/dL — AB (ref 8.9–10.3)
CO2: 22 mmol/L (ref 22–32)
Chloride: 104 mmol/L (ref 101–111)
Creatinine, Ser: 3.69 mg/dL — ABNORMAL HIGH (ref 0.61–1.24)
GFR calc Af Amer: 18 mL/min — ABNORMAL LOW (ref >60)
GFR, EST NON AFRICAN AMERICAN: 15 mL/min — AB (ref >60)
GLUCOSE: 218 mg/dL — AB (ref 65–99)
Potassium: 3.8 mmol/L (ref 3.5–5.1)
Sodium: 136 mmol/L (ref 135–145)

## 2016-07-25 LAB — GLUCOSE, CAPILLARY
GLUCOSE-CAPILLARY: 192 mg/dL — AB (ref 65–99)
GLUCOSE-CAPILLARY: 245 mg/dL — AB (ref 65–99)
GLUCOSE-CAPILLARY: 220 mg/dL — AB (ref 65–99)
Glucose-Capillary: 196 mg/dL — ABNORMAL HIGH (ref 65–99)

## 2016-07-25 LAB — LACTIC ACID, PLASMA
LACTIC ACID, VENOUS: 2.5 mmol/L — AB (ref 0.5–1.9)
Lactic Acid, Venous: 4 mmol/L (ref 0.5–1.9)
Lactic Acid, Venous: 2.9 mmol/L (ref 0.5–1.9)

## 2016-07-25 LAB — TROPONIN I
TROPONIN I: 0.07 ng/mL — AB (ref <0.03)
TROPONIN I: 0.09 ng/mL — AB (ref <0.03)

## 2016-07-25 MED ORDER — IOPAMIDOL (ISOVUE-300) INJECTION 61%
INTRAVENOUS | Status: AC
  Filled 2016-07-25: qty 30

## 2016-07-25 MED ORDER — VANCOMYCIN HCL 10 G IV SOLR
2000.0000 mg | INTRAVENOUS | Status: DC
  Filled 2016-07-25: qty 2000

## 2016-07-25 MED ORDER — HYDROCORTISONE NA SUCCINATE PF 100 MG IJ SOLR: 100.0000 mg | Freq: Once | INTRAMUSCULAR | Status: DC

## 2016-07-25 MED ORDER — SODIUM CHLORIDE 0.9 % IV SOLN
INTRAVENOUS | Status: DC
  Administered 2016-07-25: 03:00:00 via INTRAVENOUS

## 2016-07-25 MED ORDER — SODIUM CHLORIDE 0.9 % IV BOLUS (SEPSIS): 1000.0000 mL | Freq: Once | INTRAVENOUS | Status: DC

## 2016-07-25 NOTE — Progress Notes (Signed)
Pharmacy Antibiotic Note  [name] [name] is a 70 y.o. male admitted on 07/24/2016 with sepsis.  Pharmacy has been consulted for vancomycin and zosyn dosing.  Plan: Change Vancomycin to 2 GM IV every 48 hours due to renal function Continue Zosyn 3.375 gm IV q8 hours F/u renal function, cultures and clinical course  Height: 6' (182.9 cm) Weight: (!) 322 lb 12.1 oz (146.4 kg) IBW/kg (Calculated) : 77.6  Temp (24hrs), Avg:98.5 F (36.9 C), Min:97.8 F (36.6 C), Max:99.8 F (37.7 C)   Recent Labs Lab 07/24/16 1144 07/24/16 1158 07/24/16 1531 07/24/16 1952 07/24/16 2034 07/24/16 2353 07/25/16 0516 07/25/16 1112  WBC 12.5*  --   --   --   --   --  10.7*  --   CREATININE 3.39*  --   --   --  3.73*  --  3.69*  --   LATICACIDVEN  --  7.66* 6.60* 5.5*  --  4.0*  --  2.5*    Estimated Creatinine Clearance: 28.1 mL/min (by C-G formula based on SCr of 3.69 mg/dL (H)).    No Known Allergies  Antimicrobials this admission: vanc 12/22  >>  zosyn 12/22 >>   Thank you for allowing pharmacy to be a part of this patient's care.  Chriss Czar 07/25/2016 12:17 PM

## 2016-07-25 NOTE — Progress Notes (Signed)
PROGRESS NOTE    [name] [name]  BOF:[id number] DOB: [birth year] DOA: 07/24/2016 PCP: Stephens Shire, MD    Brief Narrative:  70 y/o male with history of CKD3, NHL, DM, presents with persistent vomiting, elevated blood sugars and lethargy/confusion. He was noted to be mildly febrile, had evidence of AKI and lactic acidosis. He was admitted for further evaluation.   Assessment & Plan:   Principal Problem:   Sepsis ([place]) Active Problems:   Diabetes ([place])   Acute on chronic kidney failure (HCC)   Hypokalemia   Elevated troponin I level   Fever   Lactic acidosis   1. Sepsis. Patient has been aggressively hydrated with IV fluids. Source at this time appears to be from his GI tract. He is currently on broad-spectrum antibiotics. Lactic acid has been trending down. Follow-up cultures. Hemodynamics appear to be stabilizing  2. Lactic acidosis. Likely related to persistent vomiting and dehydration. Trending down. Continue to follow.  3. AKI on CKD3. Baseline creatinine appears to be around 2.4. He presented with a creatinine of 3.3 which has since he to 3.7. He was taking ARB/hydrochlorothiazide and Mobic prior to admission. These have been held. He's been on IV hydration. Continue to follow urine output.  4. Elevated troponin. Suspect this is related to demand ischemia. No EKG changes. No chest pain. No further workup planned.  5. Diabetes. He is on basal and bolus insulin. Currently on Lantus. Adjust insulin accordingly. Blood sugars have improved since admission. Continue sliding scale insulin.  6. Hypokalemia. Improved with replacement.  7. Vomiting. Possibly related to gastroenteritis. Since he was febrile and had significant lactic acidosis, will check CT of the abdomen to rule out any other occult pathology.   DVT prophylaxis: lovenox Code Status: full Family Communication: no family present Disposition Plan: discharge home once improved   Consultants:     Procedures:      Antimicrobials:   Vancomycin 12/22>>  Zosyn 12/22>>   Subjective: Has some soreness in abdomen from vomiting. Started having loose stools. Vomiting is better.  Objective: Vitals:   07/25/16 0600 07/25/16 0700 07/25/16 0744 07/25/16 0800  BP: 129/75 (!) 142/91  124/66  Pulse: 86 88 83 85  Resp: (!) 26 (!) 23 19 18   Temp:   98.3 F (36.8 C)   TempSrc:   Oral   SpO2: 93% 94% 95% 95%  Weight:      Height:        Intake/Output Summary (Last 24 hours) at 07/25/16 1021 Last data filed at 07/25/16 0842  Gross per 24 hour  Intake          5261.67 ml  Output              941 ml  Net          4320.67 ml   Filed Weights   07/24/16 1133 07/24/16 1800 07/25/16 0400  Weight: (!) 142.9 kg (315 lb) (!) 146.1 kg (322 lb 1.5 oz) (!) 146.4 kg (322 lb 12.1 oz)    Examination:  General exam: Appears calm and comfortable  Respiratory system: diminished breath sounds at bases. Respiratory effort normal. Cardiovascular system: S1 & S2 heard, RRR. No JVD, murmurs, rubs, gallops or clicks. 1+ pedal edema. Gastrointestinal system: Abdomen is obese, soft and nontender. No organomegaly or masses felt. Normal bowel sounds heard. Central nervous system: Alert and oriented. No focal neurological deficits. Extremities: Symmetric 5 x 5 power. Skin: No rashes, lesions or ulcers Psychiatry: Judgement and insight appear normal. Mood &  affect appropriate.     Data Reviewed: I have personally reviewed following labs and imaging studies  CBC:  Recent Labs Lab 07/24/16 1144 07/25/16 0516  WBC 12.5* 10.7*  NEUTROABS 11.4*  --   HGB 16.4 13.0  HCT 47.8 38.2*  MCV 89.5 89.3  PLT 130* 98*   Basic Metabolic Panel:  Recent Labs Lab 07/24/16 1144 07/24/16 2034 07/25/16 0516  NA 135 135 136  K 2.7* 3.9 3.8  CL 95* 100* 104  CO2 23 21* 22  GLUCOSE 449* 341* 218*  BUN 40* 44* 46*  CREATININE 3.39* 3.73* 3.69*  CALCIUM 9.3 8.0* 7.6*   GFR: Estimated Creatinine Clearance: 28.1  mL/min (by C-G formula based on SCr of 3.69 mg/dL (H)). Liver Function Tests:  Recent Labs Lab 07/24/16 1144  AST 77*  ALT 52  ALKPHOS 103  BILITOT 6.0*  PROT 7.0  ALBUMIN 3.7   No results for input(s): LIPASE, AMYLASE in the last 168 hours. No results for input(s): AMMONIA in the last 168 hours. Coagulation Profile: No results for input(s): INR, PROTIME in the last 168 hours. Cardiac Enzymes:  Recent Labs Lab 07/24/16 1144 07/24/16 2034 07/24/16 2353 07/25/16 0516  TROPONINI 0.06* 0.06* 0.07* 0.09*   BNP (last 3 results) No results for input(s): PROBNP in the last 8760 hours. HbA1C: No results for input(s): HGBA1C in the last 72 hours. CBG:  Recent Labs Lab 07/24/16 1129 07/24/16 1417 07/24/16 1734 07/24/16 2117 07/25/16 0751  GLUCAP 452* 393* 349* 329* 192*   Lipid Profile: No results for input(s): CHOL, HDL, LDLCALC, TRIG, CHOLHDL, LDLDIRECT in the last 72 hours. Thyroid Function Tests: No results for input(s): TSH, T4TOTAL, FREET4, T3FREE, THYROIDAB in the last 72 hours. Anemia Panel: No results for input(s): VITAMINB12, FOLATE, FERRITIN, TIBC, IRON, RETICCTPCT in the last 72 hours. Sepsis Labs:  Recent Labs Lab 07/24/16 1158 07/24/16 1531 07/24/16 1952 07/24/16 2353  LATICACIDVEN 7.66* 6.60* 5.5* 4.0*    Recent Results (from the past 240 hour(s))  Blood Culture (routine x 2)     Status: None (Preliminary result)   Collection Time: 07/24/16 12:03 PM  Result Value Ref Range Status   Specimen Description BLOOD RIGHT HAND  Final   Special Requests BOTTLES DRAWN AEROBIC AND ANAEROBIC 6CC EACH  Final   Culture NO GROWTH < 24 HOURS  Final   Report Status PENDING  Incomplete  Blood Culture (routine x 2)     Status: None (Preliminary result)   Collection Time: 07/24/16 12:11 PM  Result Value Ref Range Status   Specimen Description LEFT ANTECUBITAL  Final   Special Requests BOTTLES DRAWN AEROBIC AND ANAEROBIC [place]  Final   Culture NO GROWTH < 24  HOURS  Final   Report Status PENDING  Incomplete  MRSA PCR Screening     Status: None   Collection Time: 07/24/16  5:36 PM  Result Value Ref Range Status   MRSA by PCR NEGATIVE NEGATIVE Final    Comment:        The GeneXpert MRSA Assay (FDA approved for NASAL specimens only), is one component of a comprehensive MRSA colonization surveillance program. It is not intended to diagnose MRSA infection nor to guide or monitor treatment for MRSA infections.          Radiology Studies: Dg Chest Portable 1 View  Result Date: 07/24/2016 CLINICAL DATA:  Lethargic. Ex-smoker. Non-Hodgkin's lymphoma. Fever. EXAM: PORTABLE CHEST 1 VIEW COMPARISON:  01/27/2016 PET. Most recent plain film 07/23/2014 acute abdomen series. FINDINGS:  Mildly degraded exam due to AP portable technique and patient body habitus. Moderate right hemidiaphragm elevation. Midline trachea. Cardiomegaly accentuated by AP portable technique. Apparent superior mediastinal soft tissue fullness is most likely due to AP portable technique and patient size. No definite pleural fluid. No pneumothorax. Pulmonary interstitial prominence is at least partially due to low lung volumes. No well-defined lobar consolidation. IMPRESSION: Multifactorial degradation, including patient body habitus and AP portable technique. Right hemidiaphragm elevation with low lung volumes and right base volume loss. Cardiomegaly with mild pulmonary interstitial prominence. Primarily felt to be secondary low lung volumes and AP portable technique. Cannot exclude mild pulmonary venous congestion. Electronically Signed   By: Abigail Miyamoto M.D.   On: 07/24/2016 12:08        Scheduled Meds: . aspirin  81 mg Oral Daily  . enoxaparin (LOVENOX) injection  70 mg Subcutaneous Q24H  . fluticasone  1 spray Each Nare Daily  . insulin aspart  0-20 Units Subcutaneous TID WC  . insulin aspart  0-5 Units Subcutaneous QHS  . insulin glargine  35 Units Subcutaneous QHS  .  levothyroxine  100 mcg Oral QAC breakfast  . levothyroxine  200 mcg Oral QAC breakfast  . piperacillin-tazobactam (ZOSYN)  IV  3.375 g Intravenous Q8H  . potassium chloride  40 mEq Oral TID  . vancomycin  1,750 mg Intravenous Q24H   Continuous Infusions:   LOS: 1 day    Time spent: 47mins    Gaylon Bentz, MD [facility] Pager [phone]  If 7PM-7AM, please contact night-coverage [email or web address] Password [facility] 07/25/2016, 10:21 AM

## 2016-07-25 NOTE — Progress Notes (Signed)
CRITICAL VALUE ALERT  Critical value received:  Lactic Acid 2.5  Date of notification:  07/25/16  Time of notification:  2419  Critical value read back: yes  Nurse who received alert: Anabel Bene  MD notified (1st page): Dr. Roderic Palau  Time of first page:  1200  No new orders received at this time.

## 2016-07-26 LAB — GLUCOSE, CAPILLARY
GLUCOSE-CAPILLARY: 155 mg/dL — AB (ref 65–99)
GLUCOSE-CAPILLARY: 215 mg/dL — AB (ref 65–99)
Glucose-Capillary: 202 mg/dL — ABNORMAL HIGH (ref 65–99)
Glucose-Capillary: 193 mg/dL — ABNORMAL HIGH (ref 65–99)

## 2016-07-26 LAB — BASIC METABOLIC PANEL
ANION GAP: 10 (ref 5–15)
BUN: 44 mg/dL — ABNORMAL HIGH (ref 6–20)
CALCIUM: 7.8 mg/dL — AB (ref 8.9–10.3)
CO2: 22 mmol/L (ref 22–32)
Chloride: 104 mmol/L (ref 101–111)
Creatinine, Ser: 3.61 mg/dL — ABNORMAL HIGH (ref 0.61–1.24)
GFR, EST AFRICAN AMERICAN: 18 mL/min — AB (ref >60)
GFR, EST NON AFRICAN AMERICAN: 16 mL/min — AB (ref >60)
Glucose, Bld: 149 mg/dL — ABNORMAL HIGH (ref 65–99)
Potassium: 4.1 mmol/L (ref 3.5–5.1)
Sodium: 136 mmol/L (ref 135–145)

## 2016-07-26 LAB — CBC
HCT: 37.8 % — ABNORMAL LOW (ref 39.0–52.0)
HEMOGLOBIN: 12.8 g/dL — AB (ref 13.0–17.0)
MCH: 30.6 pg (ref 26.0–34.0)
MCHC: 33.9 g/dL (ref 30.0–36.0)
MCV: 90.4 fL (ref 78.0–100.0)
Platelets: 99 10*3/uL — ABNORMAL LOW (ref 150–400)
RBC: 4.18 MIL/uL — AB (ref 4.22–5.81)
RDW: 15.6 % — ABNORMAL HIGH (ref 11.5–15.5)
WBC: 8.4 10*3/uL (ref 4.0–10.5)

## 2016-07-26 LAB — URINE CULTURE: Culture: NO GROWTH

## 2016-07-26 NOTE — Progress Notes (Signed)
1445 Patient transferred to Dept 300 room# 312. A&O, able to make needs known. Patient belongings taken with patient. Nurse aware of patient's arrival to Dept 300.

## 2016-07-26 NOTE — Progress Notes (Signed)
PROGRESS NOTE    [name] [name]  EXB:[id number] DOB: Dec 21, 1945 DOA: 07/24/2016 PCP: Stephens Shire, MD    Brief Narrative:  70 y/o male with history of CKD3, NHL, DM, presents with persistent vomiting, elevated blood sugars and lethargy/confusion. He was noted to be mildly febrile, had evidence of AKI and lactic acidosis. He was admitted for further evaluation.   Assessment & Plan:   Principal Problem:   Sepsis ([place]) Active Problems:   Diabetes ([place])   Acute on chronic kidney failure (HCC)   Hypokalemia   Elevated troponin I level   Fever   Lactic acidosis   1. Sepsis. Patient has been aggressively hydrated with IV fluids. Source at this time appears to be from his GI tract. He is currently on broad-spectrum antibiotics. Lactic acid has been trending down. Follow-up cultures. Hemodynamics appear to be stabilizing. No significant findings on cultures and patient continues to improve, can consider discontinuing abx tomorrow  2. Lactic acidosis. Likely related to persistent vomiting and dehydration. Trending down. Continue to follow.  3. AKI on CKD3. Baseline creatinine appears to be around 2.4. He presented with a creatinine of 3.3 which has since he to 3.7. He was taking ARB/hydrochlorothiazide and Mobic prior to admission. These have been held. Urine output has been excellent. Continue to follow.  4. Elevated troponin. Suspect this is related to demand ischemia. No EKG changes. No chest pain. No further workup planned.  5. Diabetes. He is on basal and bolus insulin. Currently on Lantus. Adjust insulin accordingly. Blood sugars have improved since admission. Continue sliding scale insulin.  6. Hypokalemia. Improved with replacement.  7. Vomiting. Possibly related to gastroenteritis. CT abdomen pelvis did not show any acute findings.   DVT prophylaxis: lovenox Code Status: full Family Communication: no family present Disposition Plan: discharge home once  improved   Consultants:     Procedures:     Antimicrobials:   Vancomycin 12/22>>  Zosyn 12/22>>   Subjective: No further diarrhea or vomiting. Overall feeling better.  Objective: Vitals:   07/26/16 1147 07/26/16 1200 07/26/16 1300 07/26/16 1400  BP:  122/83 123/78   Pulse:  68 70 72  Resp:      Temp: 98.4 F (36.9 C)     TempSrc: Oral     SpO2:  94% 93% 94%  Weight:      Height:        Intake/Output Summary (Last 24 hours) at 07/26/16 1616 Last data filed at 07/26/16 1244  Gross per 24 hour  Intake              660 ml  Output             3150 ml  Net            -2490 ml   Filed Weights   07/24/16 1800 07/25/16 0400 07/26/16 0500  Weight: (!) 146.1 kg (322 lb 1.5 oz) (!) 146.4 kg (322 lb 12.1 oz) (!) 143.8 kg (317 lb 0.3 oz)    Examination:  General exam: Appears calm and comfortable  Respiratory system: diminished breath sounds at bases. Respiratory effort normal. Cardiovascular system: S1 & S2 heard, RRR. No JVD, murmurs, rubs, gallops or clicks. 1+ pedal edema. Gastrointestinal system: Abdomen is obese, soft and nontender. No organomegaly or masses felt. Normal bowel sounds heard. Central nervous system: Alert and oriented. No focal neurological deficits. Extremities: Symmetric 5 x 5 power. Skin: No rashes, lesions or ulcers Psychiatry: Judgement and insight appear normal. Mood & affect appropriate.  Data Reviewed: I have personally reviewed following labs and imaging studies  CBC:  Recent Labs Lab 07/24/16 1144 07/25/16 0516 07/26/16 0540  WBC 12.5* 10.7* 8.4  NEUTROABS 11.4*  --   --   HGB 16.4 13.0 12.8*  HCT 47.8 38.2* 37.8*  MCV 89.5 89.3 90.4  PLT 130* 98* 99*   Basic Metabolic Panel:  Recent Labs Lab 07/24/16 1144 07/24/16 2034 07/25/16 0516 07/26/16 0540  NA 135 135 136 136  K 2.7* 3.9 3.8 4.1  CL 95* 100* 104 104  CO2 23 21* 22 22  GLUCOSE 449* 341* 218* 149*  BUN 40* 44* 46* 44*  CREATININE 3.39* 3.73* 3.69*  3.61*  CALCIUM 9.3 8.0* 7.6* 7.8*   GFR: Estimated Creatinine Clearance: 28.4 mL/min (by C-G formula based on SCr of 3.61 mg/dL (H)). Liver Function Tests:  Recent Labs Lab 07/24/16 1144  AST 77*  ALT 52  ALKPHOS 103  BILITOT 6.0*  PROT 7.0  ALBUMIN 3.7   No results for input(s): LIPASE, AMYLASE in the last 168 hours. No results for input(s): AMMONIA in the last 168 hours. Coagulation Profile: No results for input(s): INR, PROTIME in the last 168 hours. Cardiac Enzymes:  Recent Labs Lab 07/24/16 1144 07/24/16 2034 07/24/16 2353 07/25/16 0516  TROPONINI 0.06* 0.06* 0.07* 0.09*   BNP (last 3 results) No results for input(s): PROBNP in the last 8760 hours. HbA1C: No results for input(s): HGBA1C in the last 72 hours. CBG:  Recent Labs Lab 07/25/16 1139 07/25/16 1632 07/25/16 2050 07/26/16 0747 07/26/16 1130  GLUCAP 196* 245* 220* 155* 215*   Lipid Profile: No results for input(s): CHOL, HDL, LDLCALC, TRIG, CHOLHDL, LDLDIRECT in the last 72 hours. Thyroid Function Tests: No results for input(s): TSH, T4TOTAL, FREET4, T3FREE, THYROIDAB in the last 72 hours. Anemia Panel: No results for input(s): VITAMINB12, FOLATE, FERRITIN, TIBC, IRON, RETICCTPCT in the last 72 hours. Sepsis Labs:  Recent Labs Lab 07/24/16 1952 07/24/16 2353 07/25/16 1112 07/25/16 1430  LATICACIDVEN 5.5* 4.0* 2.5* 2.9*    Recent Results (from the past 240 hour(s))  Blood Culture (routine x 2)     Status: None (Preliminary result)   Collection Time: 07/24/16 12:03 PM  Result Value Ref Range Status   Specimen Description BLOOD RIGHT HAND  Final   Special Requests BOTTLES DRAWN AEROBIC AND ANAEROBIC [place]  Final   Culture NO GROWTH 2 DAYS  Final   Report Status PENDING  Incomplete  Blood Culture (routine x 2)     Status: None (Preliminary result)   Collection Time: 07/24/16 12:11 PM  Result Value Ref Range Status   Specimen Description LEFT ANTECUBITAL  Final   Special Requests  BOTTLES DRAWN AEROBIC AND ANAEROBIC [place]  Final   Culture NO GROWTH 2 DAYS  Final   Report Status PENDING  Incomplete  Urine culture     Status: None   Collection Time: 07/24/16  1:00 PM  Result Value Ref Range Status   Specimen Description URINE, CLEAN CATCH  Final   Special Requests NONE  Final   Culture NO GROWTH Performed at [facility]   Final   Report Status 07/26/2016 FINAL  Final  MRSA PCR Screening     Status: None   Collection Time: 07/24/16  5:36 PM  Result Value Ref Range Status   MRSA by PCR NEGATIVE NEGATIVE Final    Comment:        The GeneXpert MRSA Assay (FDA approved for NASAL specimens only), is  one component of a comprehensive MRSA colonization surveillance program. It is not intended to diagnose MRSA infection nor to guide or monitor treatment for MRSA infections.          Radiology Studies: Ct Abdomen Pelvis Wo Contrast  Result Date: 07/25/2016 CLINICAL DATA:  Vomiting, sepsis, non-Hodgkin's lymphoma, diabetes, chronic kidney disease. Remote cholecystectomy. EXAM: CT ABDOMEN AND PELVIS WITHOUT CONTRAST TECHNIQUE: Multidetector CT imaging of the abdomen and pelvis was performed following the standard protocol without IV contrast. COMPARISON:  06/07/2015 FINDINGS: Lower chest: Right middle lobe and medial right lower lobe partial collapse/ consolidation with central air bronchograms. No mucous plugging. Difficult to exclude consolidative pneumonia. Left lower lobe is clear. Mild lower chest paraesophageal prominent lymph nodes as before. Normal heart size. No pericardial or pleural effusion. Degenerative changes of the thoracic spine. Hepatobiliary: Remote cholecystectomy. No biliary obstruction or dilatation. No definite focal hepatic abnormality within the limits of noncontrast imaging. Pancreas: Unremarkable. No pancreatic ductal dilatation or surrounding inflammatory changes. Spleen: Stable mild splenomegaly. Spleen measures 15.3 cm in length.  Access to splenule noted in the hilum. Adrenals/Urinary Tract: Normal adrenal glands. Kidneys demonstrate chronic appearing perinephric strandy edema without obstruction or hydronephrosis. No obstructing ureteral calculus. Stable appearance of the bladder. Persistent soft tissue mass along the left aspect of the bladder dome, appearing smaller roughly measuring 6.4 x 2.5 cm, previously 7.4 x 5.3 cm. 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 Stomach/Bowel: Negative for bowel obstruction, significant dilatation, ileus, or free air. Normal appearing appendix with contrast in the lumen. No fluid collection or abscess. Vascular/Lymphatic: Aortic atherosclerosis evident without aneurysm. Iliac vessels are mildly atherosclerotic and tortuous. Stable small retroperitoneal periaortic nodes and iliac nodes. These all measure less than 1 cm. No significant developing adenopathy. Reproductive: Unremarkable. Other: Anterior midline partially calcified omental mass just below the xiphoid again noted without change may represent a treated calcified residual nodal mass. Musculoskeletal: Diffuse degenerative changes noted spine. Lower lumbar facet arthropathy, most pronounced at L5-S1. No acute compression fracture or other acute osseous finding. IMPRESSION: Stable splenomegaly and small retroperitoneal and iliac lymph nodes. No evidence of developing adenopathy or significant disease progression. Smaller anterior left pelvic mass along the left aspect of the bladder compatible with a response to radiation therapy. Right middle lobe and medial right lower lobe partial collapse/ consolidation. Difficult to exclude consolidative pneumonia. No abdominal fluid collection or abscess. Electronically Signed   By: Jerilynn Mages.  Shick M.D.   On: 07/25/2016 13:58        Scheduled Meds: . aspirin  81 mg Oral Daily  . enoxaparin (LOVENOX) injection  70 mg Subcutaneous Q24H  . fluticasone  1 spray Each Nare  Daily  . insulin aspart  0-20 Units Subcutaneous TID WC  . insulin aspart  0-5 Units Subcutaneous QHS  . insulin glargine  35 Units Subcutaneous QHS  . levothyroxine  100 mcg Oral QAC breakfast  . levothyroxine  200 mcg Oral QAC breakfast  . piperacillin-tazobactam (ZOSYN)  IV  3.375 g Intravenous Q8H  . potassium chloride  40 mEq Oral TID  . [START ON 07/27/2016] vancomycin  2,000 mg Intravenous Q48H   Continuous Infusions:   LOS: 2 days    Time spent: 31mins    Kathie Dike, MD [facility] Pager [phone]  If 7PM-7AM, please contact night-coverage [email or web address] Password [email or web address] 07/26/2016, 4:16 PM

## 2016-07-27 LAB — BASIC METABOLIC PANEL
Anion gap: 9 (ref 5–15)
BUN: 43 mg/dL — AB (ref 6–20)
CHLORIDE: 106 mmol/L (ref 101–111)
CO2: 22 mmol/L (ref 22–32)
Calcium: 8.5 mg/dL — ABNORMAL LOW (ref 8.9–10.3)
Creatinine, Ser: 3.19 mg/dL — ABNORMAL HIGH (ref 0.61–1.24)
GFR calc Af Amer: 21 mL/min — ABNORMAL LOW (ref >60)
GFR, EST NON AFRICAN AMERICAN: 18 mL/min — AB (ref >60)
GLUCOSE: 203 mg/dL — AB (ref 65–99)
POTASSIUM: 4.3 mmol/L (ref 3.5–5.1)
Sodium: 137 mmol/L (ref 135–145)

## 2016-07-27 LAB — CBC
HEMATOCRIT: 40.3 % (ref 39.0–52.0)
Hemoglobin: 13.4 g/dL (ref 13.0–17.0)
MCH: 29.8 pg (ref 26.0–34.0)
MCHC: 33.3 g/dL (ref 30.0–36.0)
MCV: 89.6 fL (ref 78.0–100.0)
PLATELETS: 84 10*3/uL — AB (ref 150–400)
RBC: 4.5 MIL/uL (ref 4.22–5.81)
RDW: 15.2 % (ref 11.5–15.5)
WBC: 5.2 10*3/uL (ref 4.0–10.5)

## 2016-07-27 LAB — GLUCOSE, CAPILLARY
GLUCOSE-CAPILLARY: 176 mg/dL — AB (ref 65–99)
Glucose-Capillary: 193 mg/dL — ABNORMAL HIGH (ref 65–99)
Glucose-Capillary: 176 mg/dL — ABNORMAL HIGH (ref 65–99)
Glucose-Capillary: 153 mg/dL — ABNORMAL HIGH (ref 65–99)

## 2016-07-27 MED ORDER — HYDROMORPHONE HCL 1 MG/ML IJ SOLN
1.0000 mg | Freq: Once | INTRAMUSCULAR | Status: AC
  Administered 2016-07-27: 1 mg via INTRAVENOUS
  Filled 2016-07-27: qty 1

## 2016-07-27 MED ORDER — FAMOTIDINE IN NACL 20-0.9 MG/50ML-% IV SOLN
20.0000 mg | Freq: Once | INTRAVENOUS | Status: AC
  Administered 2016-07-27: 20 mg via INTRAVENOUS
  Filled 2016-07-27: qty 50

## 2016-07-27 NOTE — Progress Notes (Signed)
PROGRESS NOTE    [name] [name]  PYK:[id number] DOB: [birth year] DOA: 07/24/2016 PCP: Stephens Shire, MD    Brief Narrative:  70 y/o male with history of CKD3, NHL, DM, presents with persistent vomiting, elevated blood sugars and lethargy/confusion. He was noted to be mildly febrile, had evidence of AKI and lactic acidosis. He was admitted for further evaluation.   Assessment & Plan:   Principal Problem:   Sepsis ([place]) Active Problems:   Diabetes ([place])   Acute on chronic kidney failure (HCC)   Hypokalemia   Elevated troponin I level   Fever   Lactic acidosis   1. Sepsis. Patient has been aggressively hydrated with IV fluids. Source at this time appears to be from his GI tract. He is currently on broad-spectrum antibiotics, but has been afebrile and blood cultures have shown no growth. Will discontinue further antibiotics and observe. Hemodynamics appear to be stabilizing.   2. Lactic acidosis. Likely related to persistent vomiting and dehydration. Trending down.   3. AKI on CKD3. Baseline creatinine appears to be around 2.4. He presented with a creatinine of 3.3 which has since he to 3.7. He was taking ARB/hydrochlorothiazide and Mobic prior to admission. These have been held. Urine output has been excellent. Creatinine improving today. Continue to follow.  4. Elevated troponin. Suspect this is related to demand ischemia. No EKG changes. No chest pain. No further workup planned.  5. Diabetes. He is on basal and bolus insulin. Currently on Lantus. Adjust insulin accordingly. Blood sugars have improved since admission. Continue sliding scale insulin.  6. Hypokalemia. Improved with replacement.  7. Vomiting. Possibly related to gastroenteritis. CT abdomen pelvis did not show any acute findings. Resolved.  8. Thrombocytopenia. Felt to be related to sepsis. Continue to monitor. Hold lovenox for now.   DVT prophylaxis: scds Code Status: full Family Communication: no family  present Disposition Plan: discharge home once improved   Consultants:     Procedures:     Antimicrobials:   Vancomycin 12/22>> 12/25  Zosyn 12/22>> 12/25   Subjective:  had some abdominal pain overnight, now improved. Had some loose stools yesterday, none today. No vomiting. No cough.  Objective: Vitals:   07/26/16 1300 07/26/16 1400 07/26/16 2035 07/27/16 0543  BP: 123/78  (!) 149/87 (!) 142/63  Pulse: 70 72 72 64  Resp:   20 20  Temp:   97.7 F (36.5 C) 97.9 F (36.6 C)  TempSrc:   Oral Oral  SpO2: 93% 94% 95% 93%  Weight:      Height:        Intake/Output Summary (Last 24 hours) at 07/27/16 1138 Last data filed at 07/27/16 0900  Gross per 24 hour  Intake              920 ml  Output             3090 ml  Net            -2170 ml   Filed Weights   07/24/16 1800 07/25/16 0400 07/26/16 0500  Weight: (!) 146.1 kg (322 lb 1.5 oz) (!) 146.4 kg (322 lb 12.1 oz) (!) 143.8 kg (317 lb 0.3 oz)    Examination:  General exam: Appears calm and comfortable  Respiratory system: diminished breath sounds at bases. Respiratory effort normal. Cardiovascular system: S1 & S2 heard, RRR. No JVD, murmurs, rubs, gallops or clicks. trace pedal edema. Gastrointestinal system: Abdomen is obese, soft and nontender. No organomegaly or masses felt. Normal bowel sounds heard. Central  nervous system: Alert and oriented. No focal neurological deficits. Extremities: Symmetric 5 x 5 power. Skin: No rashes, lesions or ulcers Psychiatry: Judgement and insight appear normal. Mood & affect appropriate.     Data Reviewed: I have personally reviewed following labs and imaging studies  CBC:  Recent Labs Lab 07/24/16 1144 07/25/16 0516 07/26/16 0540 07/27/16 0617  WBC 12.5* 10.7* 8.4 5.2  NEUTROABS 11.4*  --   --   --   HGB 16.4 13.0 12.8* 13.4  HCT 47.8 38.2* 37.8* 40.3  MCV 89.5 89.3 90.4 89.6  PLT 130* 98* 99* 84*   Basic Metabolic Panel:  Recent Labs Lab 07/24/16 1144  07/24/16 2034 07/25/16 0516 07/26/16 0540 07/27/16 0617  NA 135 135 136 136 137  K 2.7* 3.9 3.8 4.1 4.3  CL 95* 100* 104 104 106  CO2 23 21* 22 22 22   GLUCOSE 449* 341* 218* 149* 203*  BUN 40* 44* 46* 44* 43*  CREATININE 3.39* 3.73* 3.69* 3.61* 3.19*  CALCIUM 9.3 8.0* 7.6* 7.8* 8.5*   GFR: Estimated Creatinine Clearance: 32.2 mL/min (by C-G formula based on SCr of 3.19 mg/dL (H)). Liver Function Tests:  Recent Labs Lab 07/24/16 1144  AST 77*  ALT 52  ALKPHOS 103  BILITOT 6.0*  PROT 7.0  ALBUMIN 3.7   No results for input(s): LIPASE, AMYLASE in the last 168 hours. No results for input(s): AMMONIA in the last 168 hours. Coagulation Profile: No results for input(s): INR, PROTIME in the last 168 hours. Cardiac Enzymes:  Recent Labs Lab 07/24/16 1144 07/24/16 2034 07/24/16 2353 07/25/16 0516  TROPONINI 0.06* 0.06* 0.07* 0.09*   BNP (last 3 results) No results for input(s): PROBNP in the last 8760 hours. HbA1C: No results for input(s): HGBA1C in the last 72 hours. CBG:  Recent Labs Lab 07/26/16 1130 07/26/16 1620 07/26/16 2047 07/27/16 0735 07/27/16 1128  GLUCAP 215* 202* 193* 193* 176*   Lipid Profile: No results for input(s): CHOL, HDL, LDLCALC, TRIG, CHOLHDL, LDLDIRECT in the last 72 hours. Thyroid Function Tests: No results for input(s): TSH, T4TOTAL, FREET4, T3FREE, THYROIDAB in the last 72 hours. Anemia Panel: No results for input(s): VITAMINB12, FOLATE, FERRITIN, TIBC, IRON, RETICCTPCT in the last 72 hours. Sepsis Labs:  Recent Labs Lab 07/24/16 1952 07/24/16 2353 07/25/16 1112 07/25/16 1430  LATICACIDVEN 5.5* 4.0* 2.5* 2.9*    Recent Results (from the past 240 hour(s))  Blood Culture (routine x 2)     Status: None (Preliminary result)   Collection Time: 07/24/16 12:03 PM  Result Value Ref Range Status   Specimen Description BLOOD RIGHT HAND  Final   Special Requests BOTTLES DRAWN AEROBIC AND ANAEROBIC [place]  Final   Culture NO  GROWTH 2 DAYS  Final   Report Status PENDING  Incomplete  Blood Culture (routine x 2)     Status: None (Preliminary result)   Collection Time: 07/24/16 12:11 PM  Result Value Ref Range Status   Specimen Description LEFT ANTECUBITAL  Final   Special Requests BOTTLES DRAWN AEROBIC AND ANAEROBIC [place]  Final   Culture NO GROWTH 2 DAYS  Final   Report Status PENDING  Incomplete  Urine culture     Status: None   Collection Time: 07/24/16  1:00 PM  Result Value Ref Range Status   Specimen Description URINE, CLEAN CATCH  Final   Special Requests NONE  Final   Culture NO GROWTH Performed at [facility]   Final   Report Status 07/26/2016 FINAL  Final  MRSA PCR Screening     Status: None   Collection Time: 07/24/16  5:36 PM  Result Value Ref Range Status   MRSA by PCR NEGATIVE NEGATIVE Final    Comment:        The GeneXpert MRSA Assay (FDA approved for NASAL specimens only), is one component of a comprehensive MRSA colonization surveillance program. It is not intended to diagnose MRSA infection nor to guide or monitor treatment for MRSA infections.          Radiology Studies: Ct Abdomen Pelvis Wo Contrast  Result Date: 07/25/2016 CLINICAL DATA:  Vomiting, sepsis, non-Hodgkin's lymphoma, diabetes, chronic kidney disease. Remote cholecystectomy. EXAM: CT ABDOMEN AND PELVIS WITHOUT CONTRAST TECHNIQUE: Multidetector CT imaging of the abdomen and pelvis was performed following the standard protocol without IV contrast. COMPARISON:  06/07/2015 FINDINGS: Lower chest: Right middle lobe and medial right lower lobe partial collapse/ consolidation with central air bronchograms. No mucous plugging. Difficult to exclude consolidative pneumonia. Left lower lobe is clear. Mild lower chest paraesophageal prominent lymph nodes as before. Normal heart size. No pericardial or pleural effusion. Degenerative changes of the thoracic spine. Hepatobiliary: Remote cholecystectomy. No biliary  obstruction or dilatation. No definite focal hepatic abnormality within the limits of noncontrast imaging. Pancreas: Unremarkable. No pancreatic ductal dilatation or surrounding inflammatory changes. Spleen: Stable mild splenomegaly. Spleen measures 15.3 cm in length. Access to splenule noted in the hilum. Adrenals/Urinary Tract: Normal adrenal glands. Kidneys demonstrate chronic appearing perinephric strandy edema without obstruction or hydronephrosis. No obstructing ureteral calculus. Stable appearance of the bladder. Persistent soft tissue mass along the left aspect of the bladder dome, appearing smaller roughly measuring 6.4 x 2.5 cm, previously 7.4 x 5.3 cm. 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 Stomach/Bowel: Negative for bowel obstruction, significant dilatation, ileus, or free air. Normal appearing appendix with contrast in the lumen. No fluid collection or abscess. Vascular/Lymphatic: Aortic atherosclerosis evident without aneurysm. Iliac vessels are mildly atherosclerotic and tortuous. Stable small retroperitoneal periaortic nodes and iliac nodes. These all measure less than 1 cm. No significant developing adenopathy. Reproductive: Unremarkable. Other: Anterior midline partially calcified omental mass just below the xiphoid again noted without change may represent a treated calcified residual nodal mass. Musculoskeletal: Diffuse degenerative changes noted spine. Lower lumbar facet arthropathy, most pronounced at L5-S1. No acute compression fracture or other acute osseous finding. IMPRESSION: Stable splenomegaly and small retroperitoneal and iliac lymph nodes. No evidence of developing adenopathy or significant disease progression. Smaller anterior left pelvic mass along the left aspect of the bladder compatible with a response to radiation therapy. Right middle lobe and medial right lower lobe partial collapse/ consolidation. Difficult to exclude consolidative  pneumonia. No abdominal fluid collection or abscess. Electronically Signed   By: Jerilynn Mages.  Shick M.D.   On: 07/25/2016 13:58        Scheduled Meds: . aspirin  81 mg Oral Daily  . fluticasone  1 spray Each Nare Daily  . insulin aspart  0-20 Units Subcutaneous TID WC  . insulin aspart  0-5 Units Subcutaneous QHS  . insulin glargine  35 Units Subcutaneous QHS  . levothyroxine  100 mcg Oral QAC breakfast  . levothyroxine  200 mcg Oral QAC breakfast  . piperacillin-tazobactam (ZOSYN)  IV  3.375 g Intravenous Q8H  . potassium chloride  40 mEq Oral TID  . vancomycin  2,000 mg Intravenous Q48H   Continuous Infusions:   LOS: 3 days    Time spent: 10mins  Kathie Dike, MD [facility] Pager [phone]  If 7PM-7AM, please contact night-coverage [email or web address] Password [email or web address] 07/27/2016, 11:38 AM

## 2016-07-27 NOTE — Progress Notes (Signed)
Patient ID: [name] [name], male   DOB: Jan 26, 1946, 70 y.o.   MRN: [id number] The patient complained of crampy abdominal pain to the staff. The chart was reviewed. I ordered dilaudid 1 mg and famotidine 20 mg IVP.  Tennis Must, MD

## 2016-07-28 ENCOUNTER — Other Ambulatory Visit (HOSPITAL_COMMUNITY): Payer: Self-pay | Admitting: Oncology

## 2016-07-28 ENCOUNTER — Ambulatory Visit (HOSPITAL_COMMUNITY): Payer: Medicare HMO | Admitting: Oncology

## 2016-07-28 ENCOUNTER — Other Ambulatory Visit (HOSPITAL_COMMUNITY): Payer: Medicare HMO

## 2016-07-28 ENCOUNTER — Ambulatory Visit (HOSPITAL_COMMUNITY): Payer: Medicare HMO

## 2016-07-28 LAB — BASIC METABOLIC PANEL
ANION GAP: 7 (ref 5–15)
BUN: 37 mg/dL — ABNORMAL HIGH (ref 6–20)
CALCIUM: 9.1 mg/dL (ref 8.9–10.3)
CO2: 25 mmol/L (ref 22–32)
Chloride: 105 mmol/L (ref 101–111)
Creatinine, Ser: 3.25 mg/dL — ABNORMAL HIGH (ref 0.61–1.24)
GFR, EST AFRICAN AMERICAN: 21 mL/min — AB (ref >60)
GFR, EST NON AFRICAN AMERICAN: 18 mL/min — AB (ref >60)
Glucose, Bld: 144 mg/dL — ABNORMAL HIGH (ref 65–99)
POTASSIUM: 5.2 mmol/L — AB (ref 3.5–5.1)
SODIUM: 137 mmol/L (ref 135–145)

## 2016-07-28 LAB — GLUCOSE, CAPILLARY
Glucose-Capillary: 187 mg/dL — ABNORMAL HIGH (ref 65–99)
Glucose-Capillary: 263 mg/dL — ABNORMAL HIGH (ref 65–99)

## 2016-07-28 LAB — CBC
HEMATOCRIT: 42.9 % (ref 39.0–52.0)
HEMOGLOBIN: 14.1 g/dL (ref 13.0–17.0)
MCH: 30.5 pg (ref 26.0–34.0)
MCHC: 32.9 g/dL (ref 30.0–36.0)
MCV: 92.7 fL (ref 78.0–100.0)
PLATELETS: 109 10*3/uL — AB (ref 150–400)
RBC: 4.63 MIL/uL (ref 4.22–5.81)
RDW: 15.8 % — AB (ref 11.5–15.5)
WBC: 5.6 10*3/uL (ref 4.0–10.5)

## 2016-07-28 MED ORDER — INSULIN DEGLUDEC 100 UNIT/ML ~~LOC~~ SOPN: 50.0000 [IU] | PEN_INJECTOR | Freq: Every day | SUBCUTANEOUS | Status: DC

## 2016-07-28 NOTE — Progress Notes (Deleted)
Patient hospitalized for sepsis  ROS

## 2016-07-28 NOTE — Care Management Important Message (Signed)
Important Message  Patient Details  Name: [name] [name] MRN: [id number] Date of Birth: 12-27-1945   Medicare Important Message Given:  Yes    Sherald Barge, RN 07/28/2016, 10:09 AM

## 2016-07-28 NOTE — Care Management Note (Signed)
Case Management Note  Patient Details  Name: Rodgers Likes MRN: [id number] Date of Birth: [birth year]  Subjective/Objective:                  Pt admitted with sepsis. He is from home, lives with his gf. He has PCP, transportation and no difficulty affording his medications. He uses a cane with ambulation. He plans to return home with self care at DC. Pt doing well on room air.   Action/Plan: Potential DC home today. No CM needs anticipated.   Expected Discharge Date:      07/28/2016            Expected Discharge Plan:  Home/Self Care  In-House Referral:  NA  Discharge planning Services  CM Consult  Post Acute Care Choice:  NA Choice offered to:  NA  Status of Service:  Completed, signed off  Sherald Barge, RN 07/28/2016, 10:08 AM

## 2016-07-28 NOTE — Progress Notes (Signed)
Patient discharged home with personal belongings, IV removed and site intact. Patient discharged with paperwork and all prescriptions.

## 2016-07-28 NOTE — Discharge Summary (Signed)
Physician Discharge Summary  Travus Oren FWY:[id number] DOB: 10/24/1945 DOA: 07/24/2016  PCP: Stephens Shire, MD  Admit date: 07/24/2016 Discharge date: 07/28/2016  Admitted From: home Disposition:  home  Recommendations for Outpatient Follow-up:  1. Follow up with PCP in 1-2 weeks 2. Please obtain BMP/CBC in one week 3. Consider outpatient nephrology referral 4. Losartan/HCTZ and mobic discontinued due to elevated creatinine  Home Health: Equipment/Devices:   Discharge Condition: stable CODE STATUS: full Diet recommendation: Heart Healthy / Carb Modified  Brief/Interim Summary: 70 y/o male with history of CKD3, NHL, DM, presents with persistent vomiting, elevated blood sugars and lethargy/confusion. He was noted to be mildly febrile, had evidence of AKI and lactic acidosis. He was admitted for further evaluation  Discharge Diagnoses:  Principal Problem:   Sepsis ([place]) Active Problems:   Diabetes ([place])   Acute on chronic kidney failure (HCC)   Hypokalemia   Elevated troponin I level   Fever   Lactic acidosis  1. Sepsis. Patient has been aggressively hydrated with IV fluids. Source at this time appears to be from his GI tract. He was treated with broad-spectrum antibiotics, but has been afebrile and blood cultures have shown no growth. Abx were discontinued on discharge. Hemodynamics appear to be stabilizing.   2. Lactic acidosis. Likely related to persistent vomiting and dehydration. Trending down.   3. AKI on CKD3. Baseline creatinine appears to be around 2.4. He presented with a creatinine of 3.3 which has since peaked to 3.7. He was taking ARB/hydrochlorothiazide and Mobic prior to admission. These have been held. Urine output has been excellent. Creatinine slowly improving. Repeat in 1 week. Consider outpatient referral to nephrology  4. Elevated troponin. Suspect this is related to demand ischemia. No EKG changes. No chest pain. No further workup planned.  5.  Diabetes. He is on basal and bolus insulin. Currently on Lantus. Adjust insulin accordingly. Blood sugars have improved since admission. Continue sliding scale insulin.  6. Hypokalemia. Improved with replacement.  7. Vomiting. Possibly related to gastroenteritis. CT abdomen pelvis did not show any acute findings. Resolved.  8. Thrombocytopenia. Felt to be related to sepsis. Improved.  Discharge Instructions  Discharge Instructions    Diet - low sodium heart healthy    Complete by:  As directed    Increase activity slowly    Complete by:  As directed      Allergies as of 07/28/2016   No Known Allergies     Medication List    STOP taking these medications   losartan-hydrochlorothiazide 100-25 MG tablet Commonly known as:  HYZAAR   meloxicam 7.5 MG tablet Commonly known as:  MOBIC     TAKE these medications   aspirin 81 MG tablet Take 81 mg by mouth daily.   fluticasone 50 MCG/ACT nasal spray Commonly known as:  FLONASE Place 1 spray into both nostrils daily.   gabapentin 100 MG capsule Commonly known as:  NEURONTIN Take 100 mg by mouth at bedtime.   insulin degludec 100 UNIT/ML Sopn FlexTouch Pen Commonly known as:  TRESIBA FLEXTOUCH Inject 0.5 mLs (50 Units total) into the skin daily at 10 pm. What changed:  how much to take   levothyroxine 200 MCG tablet Commonly known as:  SYNTHROID, LEVOTHROID Take 200 mcg by mouth daily before breakfast.   levothyroxine 100 MCG tablet Commonly known as:  SYNTHROID, LEVOTHROID Take 100 mcg by mouth daily before breakfast.   NOVOLOG FLEXPEN 100 UNIT/ML FlexPen Generic drug:  insulin aspart Inject into the skin. As needed  for blood sugar   traZODone 50 MG tablet Commonly known as:  DESYREL Take 50-100 mg by mouth at bedtime.   verapamil 240 MG CR tablet Commonly known as:  CALAN-SR Take 240 mg by mouth daily.       No Known Allergies  Consultations:     Procedures/Studies: Ct Abdomen Pelvis Wo  Contrast  Result Date: 07/25/2016 CLINICAL DATA:  Vomiting, sepsis, non-Hodgkin's lymphoma, diabetes, chronic kidney disease. Remote cholecystectomy. EXAM: CT ABDOMEN AND PELVIS WITHOUT CONTRAST TECHNIQUE: Multidetector CT imaging of the abdomen and pelvis was performed following the standard protocol without IV contrast. COMPARISON:  06/07/2015 FINDINGS: Lower chest: Right middle lobe and medial right lower lobe partial collapse/ consolidation with central air bronchograms. No mucous plugging. Difficult to exclude consolidative pneumonia. Left lower lobe is clear. Mild lower chest paraesophageal prominent lymph nodes as before. Normal heart size. No pericardial or pleural effusion. Degenerative changes of the thoracic spine. Hepatobiliary: Remote cholecystectomy. No biliary obstruction or dilatation. No definite focal hepatic abnormality within the limits of noncontrast imaging. Pancreas: Unremarkable. No pancreatic ductal dilatation or surrounding inflammatory changes. Spleen: Stable mild splenomegaly. Spleen measures 15.3 cm in length. Access to splenule noted in the hilum. Adrenals/Urinary Tract: Normal adrenal glands. Kidneys demonstrate chronic appearing perinephric strandy edema without obstruction or hydronephrosis. No obstructing ureteral calculus. Stable appearance of the bladder. Persistent soft tissue mass along the left aspect of the bladder dome, appearing smaller roughly measuring 6.4 x 2.5 cm, previously 7.4 x 5.3 cm. 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 Stomach/Bowel: Negative for bowel obstruction, significant dilatation, ileus, or free air. Normal appearing appendix with contrast in the lumen. No fluid collection or abscess. Vascular/Lymphatic: Aortic atherosclerosis evident without aneurysm. Iliac vessels are mildly atherosclerotic and tortuous. Stable small retroperitoneal periaortic nodes and iliac nodes. These all measure less than 1 cm. No  significant developing adenopathy. Reproductive: Unremarkable. Other: Anterior midline partially calcified omental mass just below the xiphoid again noted without change may represent a treated calcified residual nodal mass. Musculoskeletal: Diffuse degenerative changes noted spine. Lower lumbar facet arthropathy, most pronounced at L5-S1. No acute compression fracture or other acute osseous finding. IMPRESSION: Stable splenomegaly and small retroperitoneal and iliac lymph nodes. No evidence of developing adenopathy or significant disease progression. Smaller anterior left pelvic mass along the left aspect of the bladder compatible with a response to radiation therapy. Right middle lobe and medial right lower lobe partial collapse/ consolidation. Difficult to exclude consolidative pneumonia. No abdominal fluid collection or abscess. Electronically Signed   By: Jerilynn Mages.  Shick M.D.   On: 07/25/2016 13:58   Dg Chest Portable 1 View  Result Date: 07/24/2016 CLINICAL DATA:  Lethargic. Ex-smoker. Non-Hodgkin's lymphoma. Fever. EXAM: PORTABLE CHEST 1 VIEW COMPARISON:  01/27/2016 PET. Most recent plain film 07/23/2014 acute abdomen series. FINDINGS: Mildly degraded exam due to AP portable technique and patient body habitus. Moderate right hemidiaphragm elevation. Midline trachea. Cardiomegaly accentuated by AP portable technique. Apparent superior mediastinal soft tissue fullness is most likely due to AP portable technique and patient size. No definite pleural fluid. No pneumothorax. Pulmonary interstitial prominence is at least partially due to low lung volumes. No well-defined lobar consolidation. IMPRESSION: Multifactorial degradation, including patient body habitus and AP portable technique. Right hemidiaphragm elevation with low lung volumes and right base volume loss. Cardiomegaly with mild pulmonary interstitial prominence. Primarily felt to be secondary low lung volumes and AP portable technique. Cannot exclude mild  pulmonary venous congestion. Electronically Signed   By:  Abigail Miyamoto M.D.   On: 07/24/2016 12:08       Subjective: No vomiting, diarrhea or shortness of breath  Discharge Exam: Vitals:   07/27/16 2048 07/28/16 0431  BP: (!) 152/84 (!) 146/70  Pulse: 64 72  Resp: 20 20  Temp: 97.6 F (36.4 C) 98.6 F (37 C)   Vitals:   07/27/16 0543 07/27/16 1450 07/27/16 2048 07/28/16 0431  BP: (!) 142/63 (!) 157/84 (!) 152/84 (!) 146/70  Pulse: 64 64 64 72  Resp: 20 20 20 20   Temp: 97.9 F (36.6 C) 98.2 F (36.8 C) 97.6 F (36.4 C) 98.6 F (37 C)  TempSrc: Oral Oral Oral Oral  SpO2: 93% 95% 97% 96%  Weight:      Height:        General: Pt is alert, awake, not in acute distress Cardiovascular: RRR, S1/S2 +, no rubs, no gallops Respiratory: CTA bilaterally, no wheezing, no rhonchi Abdominal: Soft, NT, ND, bowel sounds + Extremities: no edema, no cyanosis    The results of significant diagnostics from this hospitalization (including imaging, microbiology, ancillary and laboratory) are listed below for reference.     Microbiology: Recent Results (from the past 240 hour(s))  Blood Culture (routine x 2)     Status: None (Preliminary result)   Collection Time: 07/24/16 12:03 PM  Result Value Ref Range Status   Specimen Description BLOOD RIGHT HAND  Final   Special Requests BOTTLES DRAWN AEROBIC AND ANAEROBIC [place]  Final   Culture NO GROWTH 4 DAYS  Final   Report Status PENDING  Incomplete  Blood Culture (routine x 2)     Status: None (Preliminary result)   Collection Time: 07/24/16 12:11 PM  Result Value Ref Range Status   Specimen Description LEFT ANTECUBITAL  Final   Special Requests BOTTLES DRAWN AEROBIC AND ANAEROBIC [place]  Final   Culture NO GROWTH 4 DAYS  Final   Report Status PENDING  Incomplete  Urine culture     Status: None   Collection Time: 07/24/16  1:00 PM  Result Value Ref Range Status   Specimen Description URINE, CLEAN CATCH  Final   Special  Requests NONE  Final   Culture NO GROWTH Performed at [facility]   Final   Report Status 07/26/2016 FINAL  Final  MRSA PCR Screening     Status: None   Collection Time: 07/24/16  5:36 PM  Result Value Ref Range Status   MRSA by PCR NEGATIVE NEGATIVE Final    Comment:        The GeneXpert MRSA Assay (FDA approved for NASAL specimens only), is one component of a comprehensive MRSA colonization surveillance program. It is not intended to diagnose MRSA infection nor to guide or monitor treatment for MRSA infections.      Labs: BNP (last 3 results) No results for input(s): BNP in the last 8760 hours. Basic Metabolic Panel:  Recent Labs Lab 07/24/16 2034 07/25/16 0516 07/26/16 0540 07/27/16 0617 07/28/16 0422  NA 135 136 136 137 137  K 3.9 3.8 4.1 4.3 5.2*  CL 100* 104 104 106 105  CO2 21* 22 22 22 25   GLUCOSE 341* 218* 149* 203* 144*  BUN 44* 46* 44* 43* 37*  CREATININE 3.73* 3.69* 3.61* 3.19* 3.25*  CALCIUM 8.0* 7.6* 7.8* 8.5* 9.1   Liver Function Tests:  Recent Labs Lab 07/24/16 1144  AST 77*  ALT 52  ALKPHOS 103  BILITOT 6.0*  PROT 7.0  ALBUMIN 3.7  No results for input(s): LIPASE, AMYLASE in the last 168 hours. No results for input(s): AMMONIA in the last 168 hours. CBC:  Recent Labs Lab 07/24/16 1144 07/25/16 0516 07/26/16 0540 07/27/16 0617 07/28/16 0422  WBC 12.5* 10.7* 8.4 5.2 5.6  NEUTROABS 11.4*  --   --   --   --   HGB 16.4 13.0 12.8* 13.4 14.1  HCT 47.8 38.2* 37.8* 40.3 42.9  MCV 89.5 89.3 90.4 89.6 92.7  PLT 130* 98* 99* 84* 109*   Cardiac Enzymes:  Recent Labs Lab 07/24/16 1144 07/24/16 2034 07/24/16 2353 07/25/16 0516  TROPONINI 0.06* 0.06* 0.07* 0.09*   BNP: Invalid input(s): POCBNP CBG:  Recent Labs Lab 07/27/16 0735 07/27/16 1128 07/27/16 1637 07/27/16 2046 07/28/16 0730  GLUCAP 193* 176* 153* 176* 187*   D-Dimer No results for input(s): DDIMER in the last 72 hours. Hgb A1c No results for  input(s): HGBA1C in the last 72 hours. Lipid Profile No results for input(s): CHOL, HDL, LDLCALC, TRIG, CHOLHDL, LDLDIRECT in the last 72 hours. Thyroid function studies No results for input(s): TSH, T4TOTAL, T3FREE, THYROIDAB in the last 72 hours.  Invalid input(s): FREET3 Anemia work up No results for input(s): VITAMINB12, FOLATE, FERRITIN, TIBC, IRON, RETICCTPCT in the last 72 hours. Urinalysis    Component Value Date/Time   COLORURINE AMBER (A) 07/24/2016 1300   APPEARANCEUR HAZY (A) 07/24/2016 1300   LABSPEC 1.012 07/24/2016 1300   PHURINE 6.0 07/24/2016 1300   GLUCOSEU >=500 (A) 07/24/2016 1300   HGBUR SMALL (A) 07/24/2016 1300   BILIRUBINUR NEGATIVE 07/24/2016 1300   KETONESUR NEGATIVE 07/24/2016 1300   PROTEINUR 30 (A) 07/24/2016 1300   UROBILINOGEN 4.0 (H) 07/23/2014 1356   NITRITE NEGATIVE 07/24/2016 1300   LEUKOCYTESUR NEGATIVE 07/24/2016 1300   Sepsis Labs Invalid input(s): PROCALCITONIN,  WBC,  LACTICIDVEN Microbiology Recent Results (from the past 240 hour(s))  Blood Culture (routine x 2)     Status: None (Preliminary result)   Collection Time: 07/24/16 12:03 PM  Result Value Ref Range Status   Specimen Description BLOOD RIGHT HAND  Final   Special Requests BOTTLES DRAWN AEROBIC AND ANAEROBIC [place]  Final   Culture NO GROWTH 4 DAYS  Final   Report Status PENDING  Incomplete  Blood Culture (routine x 2)     Status: None (Preliminary result)   Collection Time: 07/24/16 12:11 PM  Result Value Ref Range Status   Specimen Description LEFT ANTECUBITAL  Final   Special Requests BOTTLES DRAWN AEROBIC AND ANAEROBIC [place]  Final   Culture NO GROWTH 4 DAYS  Final   Report Status PENDING  Incomplete  Urine culture     Status: None   Collection Time: 07/24/16  1:00 PM  Result Value Ref Range Status   Specimen Description URINE, CLEAN CATCH  Final   Special Requests NONE  Final   Culture NO GROWTH Performed at [facility]   Final   Report Status  07/26/2016 FINAL  Final  MRSA PCR Screening     Status: None   Collection Time: 07/24/16  5:36 PM  Result Value Ref Range Status   MRSA by PCR NEGATIVE NEGATIVE Final    Comment:        The GeneXpert MRSA Assay (FDA approved for NASAL specimens only), is one component of a comprehensive MRSA colonization surveillance program. It is not intended to diagnose MRSA infection nor to guide or monitor treatment for MRSA infections.      Time coordinating discharge: Over  30 minutes  SIGNED:   Kathie Dike, MD  [facility] 07/28/2016, 11:05 AM Pager   If 7PM-7AM, please contact night-coverage [email or web address] Password [email or web address]

## 2016-07-29 ENCOUNTER — Ambulatory Visit (HOSPITAL_COMMUNITY): Payer: Medicare HMO

## 2016-07-29 LAB — CULTURE, BLOOD (ROUTINE X 2)
Culture: NO GROWTH
Culture: NO GROWTH

## 2016-08-10 ENCOUNTER — Encounter (HOSPITAL_COMMUNITY): Payer: Medicare HMO

## 2016-08-10 ENCOUNTER — Encounter (HOSPITAL_COMMUNITY): Payer: Self-pay

## 2016-08-10 ENCOUNTER — Encounter (HOSPITAL_COMMUNITY): Payer: Medicare HMO | Attending: Oncology

## 2016-08-10 VITALS — BP 115/58 | HR 54 | Temp 97.5°F | Resp 18 | Wt 313.0 lb

## 2016-08-10 DIAGNOSIS — Z5112 Encounter for antineoplastic immunotherapy: Secondary | ICD-10-CM

## 2016-08-10 DIAGNOSIS — C8580 Other specified types of non-Hodgkin lymphoma, unspecified site: Secondary | ICD-10-CM | POA: Diagnosis not present

## 2016-08-10 LAB — CBC WITH DIFFERENTIAL/PLATELET
BASOS ABS: 0.1 10*3/uL (ref 0.0–0.1)
Basophils Relative: 1 %
EOS PCT: 4 %
Eosinophils Absolute: 0.2 10*3/uL (ref 0.0–0.7)
HCT: 43.3 % (ref 39.0–52.0)
Hemoglobin: 14.6 g/dL (ref 13.0–17.0)
LYMPHS PCT: 21 %
Lymphs Abs: 1.2 10*3/uL (ref 0.7–4.0)
MCH: 30.3 pg (ref 26.0–34.0)
MCHC: 33.7 g/dL (ref 30.0–36.0)
MCV: 89.8 fL (ref 78.0–100.0)
Monocytes Absolute: 0.7 10*3/uL (ref 0.1–1.0)
Monocytes Relative: 13 %
NEUTROS ABS: 3.3 10*3/uL (ref 1.7–7.7)
Neutrophils Relative %: 61 %
PLATELETS: 152 10*3/uL (ref 150–400)
RBC: 4.82 MIL/uL (ref 4.22–5.81)
RDW: 14.6 % (ref 11.5–15.5)
WBC: 5.5 10*3/uL (ref 4.0–10.5)

## 2016-08-10 LAB — COMPREHENSIVE METABOLIC PANEL
ALT: 21 U/L (ref 17–63)
AST: 23 U/L (ref 15–41)
Albumin: 3.6 g/dL (ref 3.5–5.0)
Alkaline Phosphatase: 88 U/L (ref 38–126)
Anion gap: 9 (ref 5–15)
BUN: 33 mg/dL — AB (ref 6–20)
CHLORIDE: 98 mmol/L — AB (ref 101–111)
CO2: 27 mmol/L (ref 22–32)
CREATININE: 2.78 mg/dL — AB (ref 0.61–1.24)
Calcium: 9.3 mg/dL (ref 8.9–10.3)
GFR calc Af Amer: 25 mL/min — ABNORMAL LOW (ref >60)
GFR, EST NON AFRICAN AMERICAN: 22 mL/min — AB (ref >60)
GLUCOSE: 282 mg/dL — AB (ref 65–99)
Potassium: 3.8 mmol/L (ref 3.5–5.1)
Sodium: 134 mmol/L — ABNORMAL LOW (ref 135–145)
Total Bilirubin: 1.2 mg/dL (ref 0.3–1.2)
Total Protein: 7 g/dL (ref 6.5–8.1)

## 2016-08-10 LAB — LACTATE DEHYDROGENASE: LDH: 100 U/L (ref 98–192)

## 2016-08-10 MED ORDER — SODIUM CHLORIDE 0.9 % IV SOLN
Freq: Once | INTRAVENOUS | Status: AC
  Administered 2016-08-10: 10:00:00 via INTRAVENOUS

## 2016-08-10 MED ORDER — DIPHENHYDRAMINE HCL 25 MG PO CAPS
50.0000 mg | ORAL_CAPSULE | Freq: Once | ORAL | Status: AC
  Administered 2016-08-10: 50 mg via ORAL

## 2016-08-10 MED ORDER — DIPHENHYDRAMINE HCL 25 MG PO CAPS
ORAL_CAPSULE | ORAL | Status: AC
  Filled 2016-08-10: qty 2

## 2016-08-10 MED ORDER — SODIUM CHLORIDE 0.9 % IV SOLN
375.0000 mg/m2 | Freq: Once | INTRAVENOUS | Status: AC
  Administered 2016-08-10: 1000 mg via INTRAVENOUS
  Filled 2016-08-10: qty 100

## 2016-08-10 MED ORDER — ACETAMINOPHEN 325 MG PO TABS
ORAL_TABLET | ORAL | Status: AC
  Filled 2016-08-10: qty 2

## 2016-08-10 MED ORDER — ACETAMINOPHEN 325 MG PO TABS
650.0000 mg | ORAL_TABLET | Freq: Once | ORAL | Status: AC
  Administered 2016-08-10: 650 mg via ORAL

## 2016-08-10 NOTE — Progress Notes (Signed)
Tolerated tx w/o adverse reaction.  Alert, in no distress.  VSS.  Discharged ambulatory. 

## 2016-08-10 NOTE — Patient Instructions (Signed)
[facility] Discharge Instructions for Patients Receiving Chemotherapy   Beginning January 23rd 2017 lab work for the [facility] will be done in the  Main lab at [facility] on 1st floor. If you have a lab appointment with the [facility] please come in thru the  Main Entrance and check in at the main information desk   Today you received the following chemotherapy agents:  Rituxan  If you develop nausea and vomiting, or diarrhea that is not controlled by your medication, call the clinic.  The clinic phone number is [phone]. Office hours are Monday-Friday 8:30am-5:00pm.  BELOW ARE SYMPTOMS THAT SHOULD BE REPORTED IMMEDIATELY:  *FEVER GREATER THAN 101.0 F  *CHILLS WITH OR WITHOUT FEVER  NAUSEA AND VOMITING THAT IS NOT CONTROLLED WITH YOUR NAUSEA MEDICATION  *UNUSUAL SHORTNESS OF BREATH  *UNUSUAL BRUISING OR BLEEDING  TENDERNESS IN MOUTH AND THROAT WITH OR WITHOUT PRESENCE OF ULCERS  *URINARY PROBLEMS  *BOWEL PROBLEMS  UNUSUAL RASH Items with * indicate a potential emergency and should be followed up as soon as possible. If you have an emergency after office hours please contact your primary care physician or go to the nearest emergency department.  Please call the clinic during office hours if you have any questions or concerns.   You may also contact the Patient Navigator at [phone] should you have any questions or need assistance in obtaining follow up care.      Resources For Cancer Patients and their Caregivers ? American Cancer Society: Can assist with transportation, wigs, general needs, runs Look Good Feel Better.        304-007-5161 ? Cancer Care: Provides financial assistance, online support groups, medication/co-pay assistance.  1-800-813-HOPE 734-507-3737) ? [place] Assists [place] cancer patients and their families through emotional , educational and financial support.   409-719-8372 ? [place] DSS Where to apply for food stamps, Medicaid and utility assistance. 847-665-3955 ? RCATS: Transportation to medical appointments. 5157440971 ? Social Security Administration: May apply for disability if have a Stage IV cancer. ([phone] [phone] ? [employer], Disability and Transit Services: Assists with nutrition, care and transit needs. [phone]

## 2016-08-14 ENCOUNTER — Encounter (HOSPITAL_COMMUNITY)
Admission: RE | Admit: 2016-08-14 | Discharge: 2016-08-14 | Disposition: A | Discharge status: Home / Self Care | Payer: Medicare HMO | Source: Ambulatory Visit | Attending: Oncology | Admitting: Oncology

## 2016-08-14 DIAGNOSIS — C8580 Other specified types of non-Hodgkin lymphoma, unspecified site: Secondary | ICD-10-CM | POA: Insufficient documentation

## 2016-08-14 LAB — GLUCOSE, CAPILLARY: Glucose-Capillary: 151 mg/dL — ABNORMAL HIGH (ref 65–99)

## 2016-08-14 MED ORDER — FLUDEOXYGLUCOSE F - 18 (FDG) INJECTION: 16.2000 | Freq: Once | INTRAVENOUS | Status: DC | PRN

## 2016-08-18 ENCOUNTER — Encounter (HOSPITAL_COMMUNITY): Payer: Self-pay | Admitting: Hematology & Oncology

## 2016-08-18 ENCOUNTER — Encounter (HOSPITAL_BASED_OUTPATIENT_CLINIC_OR_DEPARTMENT_OTHER): Payer: Medicare HMO | Admitting: Hematology & Oncology

## 2016-08-18 VITALS — BP 149/80 | HR 70 | Temp 97.9°F | Resp 20 | Wt 318.2 lb

## 2016-08-18 DIAGNOSIS — R739 Hyperglycemia, unspecified: Secondary | ICD-10-CM

## 2016-08-18 DIAGNOSIS — D696 Thrombocytopenia, unspecified: Secondary | ICD-10-CM

## 2016-08-18 DIAGNOSIS — N189 Chronic kidney disease, unspecified: Secondary | ICD-10-CM | POA: Diagnosis not present

## 2016-08-18 DIAGNOSIS — C8580 Other specified types of non-Hodgkin lymphoma, unspecified site: Secondary | ICD-10-CM | POA: Diagnosis not present

## 2016-08-18 DIAGNOSIS — E1165 Type 2 diabetes mellitus with hyperglycemia: Secondary | ICD-10-CM

## 2016-08-18 NOTE — Patient Instructions (Signed)
[place] at [facility] Discharge Instructions  RECOMMENDATIONS MADE BY THE CONSULTANT AND ANY TEST RESULTS WILL BE SENT TO YOUR REFERRING PHYSICIAN.  You were seen today by Dr. Whitney Muse. Return with next Rituxan for treatment and labs only. Return with last Rituxan for follow up, treatment and labs.  Thank you for choosing [place] at [facility] to provide your oncology and hematology care.  To afford each patient quality time with our provider, please arrive at least 15 minutes before your scheduled appointment time.    If you have a lab appointment with the [facility] please come in thru the  Main Entrance and check in at the main information desk  You need to re-schedule your appointment should you arrive 10 or more minutes late.  We strive to give you quality time with our providers, and arriving late affects you and other patients whose appointments are after yours.  Also, if you no show three or more times for appointments you may be dismissed from the clinic at the providers discretion.     Again, thank you for choosing [facility].  Our hope is that these requests will decrease the amount of time that you wait before being seen by our physicians.       _____________________________________________________________  Should you have questions after your visit to [facility], please contact our office at [phone] between the hours of 8:30 a.m. and 4:30 p.m.  Voicemails left after 4:30 p.m. will not be returned until the following business day.  For prescription refill requests, have your pharmacy contact our office.       Resources For Cancer Patients and their Caregivers ? American Cancer Society: Can assist with transportation, wigs, general needs, runs Look Good Feel Better.        715 573 6746 ? Cancer Care: Provides financial assistance, online support groups, medication/co-pay assistance.   1-800-813-HOPE 708 463 5932) ? [place] Assists [place] cancer patients and their families through emotional , educational and financial support.  478-835-8146 ? [place] DSS Where to apply for food stamps, Medicaid and utility assistance. 513-539-9779 ? RCATS: Transportation to medical appointments. 267-696-2668 ? Social Security Administration: May apply for disability if have a Stage IV cancer. 628-071-2443 209-579-1375 ? [employer], Disability and Transit Services: Assists with nutrition, care and transit needs. [place] Support Programs: @10RELATIVEDAYS @ > Cancer Support Group  2nd Tuesday of the month 1pm-2pm, Journey Room  > Creative Journey  3rd Tuesday of the month 1130am-1pm, Journey Room  > Look Good Feel Better  1st Wednesday of the month 10am-12 noon, Journey Room (Call [place] to register [phone])

## 2016-08-18 NOTE — Progress Notes (Signed)
[facility] PROGRESS NOTE  Patient Care Team: McFarlan as PCP - General (Family Medicine)  CHIEF COMPLAINTS:  Marginal Zone Lymphoma EGD 09/03/2014 negative for intestinal metaplasia, dysplasia or malignancy H pylori negative Colonoscopy on 09/03/2014 with 6 polyps (tubular adenomas), redundant left colon  Marginal zone lymphoma   Staging form: Lymphoid Neoplasms, AJCC 6th Edition     Clinical stage from 11/07/2014: Stage II - Unsigned    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[place])   07/23/2014 Imaging    CT C/A/P with soft tissue mass in pelvis, LN adjacent to distal esophagus, upper abdomen a partially calcified soft tissue mass      09/27/2014 Initial Biopsy    CT guided biopsy      10/17/2014 PET scan    Hypermetabolic mass left adjacent to the bladder. The mass is mild to moderate in metabolic activity consistent with low-grade lymphoma. Mild left external iliac and common iliac metabolic adenopathy.Single retroperitoneal node anterior to L renal vein      10/31/2014 - 11/21/2014 Chemotherapy    Single Agent Rituxan weekly x 4      01/21/2015 -  Antibody Plan         09/05/2015 - 09/23/2015 Radiation Therapy    Palliative XRT to palvic mass adjacent to bladder, 24 Gy in 12 fractions by Dr. Tammi Klippel      01/27/2016 PET scan    Hypermetabolic anterior left pelvic mass abutting the left superior bladder wall has decreased in size but mildly increased in metabolism, consistent with mild metabolic progression.      08/14/2016 PET scan    1. The mass along the left anterior urinary bladder is reduced in size and reduced in activity compared to the prior exam. The 2. Sub xiphoid os ossific structure with adjacent faint soft tissue density, the ossific structure has similar metabolic activity to other normal bony structures. 3. Mildly enlarged right subcarinal lymph node is not hypermetabolic and is below mediastinal activity. 4. Splenomegaly, without  splenic hypermetabolic activity.       HISTORY OF PRESENTING ILLNESS:  [name] [name] 71 y.o. male is here because of Stage II marginal zone lymphoma.  He was admitted to [facility] in December 2015 with abdominal pain. The pain was described as severe. He also reported episodes of vomiting. At presentation he had elevated liver function tests and bilirubin, CT scan of the abdomen and pelvis showed a possible bladder mass abutting the colon.He underwent a colonoscopy on 09/03/2014 with Dr. Oneida Alar. No mass was noted on exam. 6 polyps were removed all without dysplasia on final pathology, and a redundant left colon was noted. He ultimately underwent a CT-guided biopsy of the pelvic mass on February 25, with final pathology consistent with a low-grade non-Hodgkin's lymphoma, marginal zone type.  Mr. Stamey returns to the [facility] alone today. He goes by the name of "[name] [name]."  He uses a cane to ambulate.   Since he saw [place] last, he lost a younger brother to cancer.   I personally reviewed and went over scans and labs with the patient.  States his left knee is worn out, He is supposed to get a shot of cortisone tomorrow. He has received cortisone shots previously.   Both his feet and knees hurt when walking on them. States his right knee is "still not right" after his previous knee replacement as he can't fully straighten it out or bend it as far back as he should be able to.  He thinks he needs it redone.    His appetite is fine.  States he threw up a lot and was dehydrated over Christmas.  Notes he has been in the hospital 3 out of the last 4 Christmases.  He got so weak, he couldn't even pull himself out of the chair.   States neuropathy in his feet. He follows his PCP for his diabetes. States his sugar has been down since he went to the hospital. They cut his insulin down a little.   Denies abdominal pain, nausea, vomiting. No pelvic pain. No difficulties with urination. Currently no problems with  his bowels.   He has no other concerns or problems at this time. No B symptoms.   MEDICAL HISTORY:  Past Medical History:  Diagnosis Date  . Cancer (HCC)    low grade non hodgkin's lymphoma  . Chronic kidney disease    stage III kidney disease   . Diabetes mellitus   . Hypothyroidism   . Lymphoma ([place])   . Peripheral vascular disease (HCC)    neuropathy in toes   . Pneumonia    hx of   . S/P biopsy   . Shortness of breath    due ot pain in knees   . Sleep apnea    sleep study 3/29 13 ? location     SURGICAL HISTORY: Past Surgical History:  Procedure Laterality Date  . CHOLECYSTECTOMY  1992  . COLONOSCOPY N/A 09/03/2014   SLF:six colon polyps removed/small internal hemorrhoids  . ESOPHAGOGASTRODUODENOSCOPY N/A 09/03/2014   SLF: mild gastritis/few gastric polyps  . TOTAL KNEE ARTHROPLASTY  11/10/2011   Procedure: TOTAL KNEE ARTHROPLASTY;  Surgeon: Mauri Pole, MD;  Location: [facility];  Service: Orthopedics;  Laterality: Right;    SOCIAL HISTORY: Social History   Social History  . Marital status: Divorced    Spouse name: N/A  . Number of children: N/A  . Years of education: N/A   Occupational History  . Not on file.   Social History Main Topics  . Smoking status: Former Smoker    Packs/day: 1.00    Years: 30.00    Types: Cigarettes    Quit date: 08/04/2007  . Smokeless tobacco: Never Used  . Alcohol use No  . Drug use: No  . Sexual activity: Yes   Other Topics Concern  . Not on file   Social History Narrative  . No narrative on file    FAMILY HISTORY: Family History  Problem Relation Age of Onset  . Cancer Mother     breast and lung  . Cancer Father     bladder  . Cancer Maternal Uncle     prostate  . Cancer Paternal Uncle     esophagus  . Colon cancer Neg Hx    indicated that the status of his mother is unknown. He indicated that the status of his father is unknown. He indicated that the status of his maternal uncle is unknown. He indicated that  the status of his paternal uncle is unknown. He indicated that the status of his neg hx is unknown.    ALLERGIES:  has No Known Allergies.  MEDICATIONS:  Current Outpatient Prescriptions  Medication Sig Dispense Refill  . aspirin 81 MG tablet Take 81 mg by mouth daily.    . fluticasone (FLONASE) 50 MCG/ACT nasal spray Place 1 spray into both nostrils daily.    Marland Kitchen gabapentin (NEURONTIN) 100 MG capsule Take 100 mg by mouth at bedtime.    Marland Kitchen  insulin aspart (NOVOLOG FLEXPEN) 100 UNIT/ML FlexPen Inject into the skin. As needed for blood sugar    . insulin degludec (TRESIBA FLEXTOUCH) 100 UNIT/ML SOPN FlexTouch Pen Inject 0.5 mLs (50 Units total) into the skin daily at 10 pm.    . levothyroxine (SYNTHROID, LEVOTHROID) 100 MCG tablet Take 100 mcg by mouth daily before breakfast.    . levothyroxine (SYNTHROID, LEVOTHROID) 200 MCG tablet Take 200 mcg by mouth daily before breakfast.    . montelukast (SINGULAIR) 10 MG tablet Take 10 mg by mouth.    . traZODone (DESYREL) 50 MG tablet Take 50-100 mg by mouth at bedtime.    . verapamil (CALAN-SR) 240 MG CR tablet Take 240 mg by mouth daily.     No current facility-administered medications for this visit.    Facility-Administered Medications Ordered in Other Visits  Medication Dose Route Frequency Provider Last Rate Last Dose  . fludeoxyglucose F - 18 (FDG) injection 95.1 millicurie  88.4 millicurie Intravenous Once PRN Lorriane Shire, MD        Review of Systems  Constitutional: Negative.        Appetite is fine  HENT: Negative.   Eyes: Negative.   Respiratory: Negative.   Cardiovascular: Negative.   Gastrointestinal: Negative.  Negative for abdominal pain, nausea and vomiting.  Genitourinary: Negative.   Musculoskeletal: Negative.        Feet and knees hurt when walking on them.   Skin: Negative.   Neurological: Positive for tingling (neuropathy in feet).  Endo/Heme/Allergies: Negative.   Psychiatric/Behavioral: Negative.   All other  systems reviewed and are negative.    PHYSICAL EXAMINATION: ECOG PERFORMANCE STATUS: 0 - Asymptomatic  Vitals with BMI 08/18/2016  Height   Weight 318 lbs 3 oz  BMI   Systolic 166  Diastolic 80  Pulse 70  Respirations 20    Physical Exam  Constitutional: He is oriented to person, place, and time and well-developed, well-nourished, and in no distress.  HENT:  Head: Normocephalic and atraumatic.  Mouth/Throat: No oropharyngeal exudate.  Eyes: Conjunctivae and EOM are normal. Pupils are equal, round, and reactive to light. No scleral icterus.  Neck: Normal range of motion. Neck supple.  Cardiovascular: Normal rate, regular rhythm and normal heart sounds.   Pulmonary/Chest: Effort normal and breath sounds normal. No respiratory distress.  Abdominal: Soft. Bowel sounds are normal. He exhibits no distension and no mass. There is no tenderness. There is no rebound and no guarding.  Musculoskeletal: Normal range of motion.  Chronic LE skin/vascular changes  Lymphadenopathy:    He has no cervical adenopathy.  Neurological: He is alert and oriented to person, place, and time. Gait normal.  Skin: Skin is warm and dry.  Psychiatric: Mood, memory, affect and judgment normal.  Nursing note and vitals reviewed.   LABORATORY DATA:  I have reviewed the data as listed  Results for [name] [name], [name] [name] (MRN [id number])   Ref. Range 08/10/2016 09:12  Sodium Latest Ref Range: 135 - 145 mmol/L 134 (L)  Potassium Latest Ref Range: 3.5 - 5.1 mmol/L 3.8  Chloride Latest Ref Range: 101 - 111 mmol/L 98 (L)  CO2 Latest Ref Range: 22 - 32 mmol/L 27  Glucose Latest Ref Range: 65 - 99 mg/dL 282 (H)  BUN Latest Ref Range: 6 - 20 mg/dL 33 (H)  Creatinine Latest Ref Range: 0.61 - 1.24 mg/dL 2.78 (H)  Calcium Latest Ref Range: 8.9 - 10.3 mg/dL 9.3  Anion gap Latest Ref Range: 5 - 15  9  Alkaline Phosphatase Latest Ref Range: 38 - 126 U/L 88  Albumin Latest Ref Range: 3.5 - 5.0 g/dL 3.6  AST Latest Ref Range: 15  - 41 U/L 23  ALT Latest Ref Range: 17 - 63 U/L 21  Total Protein Latest Ref Range: 6.5 - 8.1 g/dL 7.0  Total Bilirubin Latest Ref Range: 0.3 - 1.2 mg/dL 1.2  EGFR (African American) Latest Ref Range: >60 mL/min 25 (L)  EGFR (Non-African Amer.) Latest Ref Range: >60 mL/min 22 (L)  LDH Latest Ref Range: 98 - 192 U/L 100  WBC Latest Ref Range: 4.0 - 10.5 K/uL 5.5  RBC Latest Ref Range: 4.22 - 5.81 MIL/uL 4.82  Hemoglobin Latest Ref Range: 13.0 - 17.0 g/dL 14.6  HCT Latest Ref Range: 39.0 - 52.0 % 43.3  MCV Latest Ref Range: 78.0 - 100.0 fL 89.8  MCH Latest Ref Range: 26.0 - 34.0 pg 30.3  MCHC Latest Ref Range: 30.0 - 36.0 g/dL 33.7  RDW Latest Ref Range: 11.5 - 15.5 % 14.6  Platelets Latest Ref Range: 150 - 400 K/uL 152  Neutrophils Latest Units: % 61  Lymphocytes Latest Units: % 21  Monocytes Relative Latest Units: % 13  Eosinophil Latest Units: % 4  Basophil Latest Units: % 1  NEUT# Latest Ref Range: 1.7 - 7.7 K/uL 3.3  Lymphocyte # Latest Ref Range: 0.7 - 4.0 K/uL 1.2  Monocyte # Latest Ref Range: 0.1 - 1.0 K/uL 0.7  Eosinophils Absolute Latest Ref Range: 0.0 - 0.7 K/uL 0.2  Basophils Absolute Latest Ref Range: 0.0 - 0.1 K/uL 0.1    PATHOLOGY:  Soft tissue mass, biopsy, adjacent to urinary bladder - ATYPICAL LYMPHOID PROLIFERATION CONSISTENT WITH NON-HODGKIN'S B-CELL LYMPHOMA. - SEE ONCOLOGY TABLE. Histologic type: Non-Hodgkin's lymphoma, see comment. Grade (if applicable): Low grade. Flow cytometry: No tissue is available for analysis since the specimen was received in formalin. Immunohistochemical stains: BCL-6, CD3, CD5, CD10, CD20, CD21, CD34, CD43, CD79a, CD138, Cyclin D-1, kappa, lambda, TdT, and Ki-67 with appropriate controls. Touch preps/imprints: Not performed. Comments: The sections show needle core biopsy of soft tissue displaying a very dense and relatively monomorphic infiltrate of small lymphoid cells with high nuclear cytoplasmic ratio, round to irregular  nuclei, dense chromatin, and small to inconspicuous nucleoli. The appearance is diffuse with lack of obvious follicular structures or proliferation centers. No necrosis or conspicuous mitosis is identified. To further evaluate this process, immunohistochemical stains were performed and show that the overwhelming majority of lymphocytes consist of B-cells, as highlighted with CD20 and CD79a. B-lymphocytes show no significant staining with BCL-6, CD10, CD5, Cyclin D-1, CD34, or TdT. CD21 highlights scattered small foci of dendritic networks throughout the core biopsies. CD138 highlights a minor plasma cell component, which consist of scattered cells and variably sized but predominantly small clusters. Kappa and lambda stains failed to show any significant staining and are considered non-contributory. Ki-67 shows very low expression (less than 5%). There is an admixed minor T-cell component present as seen with CD3, CD5, and CD43. The overall features are consistent with low grade non-Hodgkin's B-cell lymphoma and the overall phenotypic features favor marginal zone type. Clinical correlation is strongly recommended. (BNS:ds 10/01/14)   RADIOGRAPHIC STUDIES: I have personally reviewed the radiological images as listed and agreed with the findings in the report. Study Result   CLINICAL DATA:  Subsequent treatment strategy for mantle cell non-Hodgkin' s lymphoma. Pelvic mass.  EXAM: NUCLEAR MEDICINE PET SKULL BASE TO THIGH  TECHNIQUE: 16.2 mCi F-18 FDG was injected intravenously. Full-ring  PET imaging was performed from the skull base to thigh after the radiotracer. CT data was obtained and used for attenuation correction and anatomic localization.  FASTING BLOOD GLUCOSE:  Value: 151 mg/dl  COMPARISON:  Multiple exams, including 01/27/2016  FINDINGS: NECK  No hypermetabolic lymph nodes in the neck.  CHEST  Stable tiny punctate 3 mm right upper lobe pulmonary nodule,  no associated measurable hypermetabolic adenopathy. 1.7 cm right subcarinal node, previously the same by my measurements, maximum SUV 3.7, less than background mediastinal blood pool activity of 4.5.  Coronary, aortic arch, and branch vessel atherosclerotic vascular disease. Mild cardiomegaly.  ABDOMEN/PELVIS  Sub xiphoid ossific structure with adjacent soft tissue density not changed from prior, maximum SUV 4.3, which is similar to other bony structures.  Elongated 0.9 cm in short axis lymph node above the left renal vein, not hypermetabolic.  The spleen measures 18.2 by 6.1 by 15.0 cm (volume = 870 cm^3) but is not hypermetabolic.  Aortoiliac atherosclerotic vascular disease.  Mass along the left anterior upper urinary bladder wall observed, mildly reduced size subjectively compared to prior, maximum SUV approximately 4.8 (formerly 6.3).  Trace pneumobilia, less than previous.  SKELETON  No focal hypermetabolic activity to suggest skeletal metastasis.  IMPRESSION: 1. The mass along the left anterior urinary bladder is reduced in size and reduced in activity compared to the prior exam. The 2. Sub xiphoid os ossific structure with adjacent faint soft tissue density, the ossific structure has similar metabolic activity to other normal bony structures. 3. Mildly enlarged right subcarinal lymph node is not hypermetabolic and is below mediastinal activity. 4. Splenomegaly, without splenic hypermetabolic activity. 5. Other imaging findings of potential clinical significance: Coronary, aortic arch, and branch vessel atherosclerotic vascular disease. Aortoiliac atherosclerotic vascular disease. Mild cardiomegaly. Trace pneumobilia, less than previous.   Electronically Signed   By: Van Clines M.D.   On: 08/14/2016 13:12      ASSESSMENT & PLAN:  Stage II marginal zone lymphoma CKD Mild Thrombocytopenia, intermittent Diabetes Hyperglycemia  He  has done well  with single agent Rituxan. We will continue with maintenance to complete 2 years.  I will put him down for follow-up in 2 months.  Thrombocytopenia is mild and stable. CKD is stable.   Labs and PET scan reviewed, results noted above.   Will continue with ongoing follow-up. Given that he has evidence of residual disease in the pelvis, will discuss ongoing imaging/follow-up at the completion of rituxan.  All questions were answered. The patient knows to call the clinic with any problems, questions or concerns.   This note was electronically signed.   This document serves as a record of services personally performed by Ancil Linsey, MD. It was created on her behalf by Shirlean Mylar, a trained medical scribe. The creation of this record is based on the scribe's personal observations and the provider's statements to them. This document has been checked and approved by the attending provider.   I have reviewed the above documentation for accuracy and completeness, and I agree with the above.   Kelby Fam. Whitney Muse, MD

## 2016-08-26 ENCOUNTER — Ambulatory Visit: Payer: Medicare HMO | Admitting: Gastroenterology

## 2016-09-03 ENCOUNTER — Encounter (HOSPITAL_COMMUNITY): Payer: Self-pay | Admitting: Hematology & Oncology

## 2016-09-30 ENCOUNTER — Ambulatory Visit: Payer: Medicare HMO | Admitting: Gastroenterology

## 2016-10-07 ENCOUNTER — Other Ambulatory Visit (HOSPITAL_COMMUNITY): Payer: Self-pay | Admitting: *Deleted

## 2016-10-07 DIAGNOSIS — C8580 Other specified types of non-Hodgkin lymphoma, unspecified site: Secondary | ICD-10-CM

## 2016-10-08 ENCOUNTER — Encounter (HOSPITAL_COMMUNITY): Payer: Self-pay

## 2016-10-08 ENCOUNTER — Encounter (HOSPITAL_COMMUNITY): Payer: Medicare HMO

## 2016-10-08 ENCOUNTER — Encounter (HOSPITAL_COMMUNITY): Payer: Medicare HMO | Attending: Oncology

## 2016-10-08 VITALS — BP 164/87 | HR 64 | Temp 97.6°F | Resp 18 | Wt 317.0 lb

## 2016-10-08 DIAGNOSIS — Z5112 Encounter for antineoplastic immunotherapy: Secondary | ICD-10-CM

## 2016-10-08 DIAGNOSIS — C8580 Other specified types of non-Hodgkin lymphoma, unspecified site: Secondary | ICD-10-CM | POA: Insufficient documentation

## 2016-10-08 LAB — CBC WITH DIFFERENTIAL/PLATELET
BASOS PCT: 1 %
Basophils Absolute: 0.1 10*3/uL (ref 0.0–0.1)
EOS PCT: 4 %
Eosinophils Absolute: 0.3 10*3/uL (ref 0.0–0.7)
HEMATOCRIT: 46.6 % (ref 39.0–52.0)
Hemoglobin: 16.1 g/dL (ref 13.0–17.0)
Lymphocytes Relative: 19 %
Lymphs Abs: 1.4 10*3/uL (ref 0.7–4.0)
MCH: 30.1 pg (ref 26.0–34.0)
MCHC: 34.5 g/dL (ref 30.0–36.0)
MCV: 87.3 fL (ref 78.0–100.0)
MONO ABS: 0.7 10*3/uL (ref 0.1–1.0)
MONOS PCT: 9 %
NEUTROS ABS: 5.1 10*3/uL (ref 1.7–7.7)
Neutrophils Relative %: 67 %
Platelets: 128 10*3/uL — ABNORMAL LOW (ref 150–400)
RBC: 5.34 MIL/uL (ref 4.22–5.81)
RDW: 14.8 % (ref 11.5–15.5)
WBC: 7.4 10*3/uL (ref 4.0–10.5)

## 2016-10-08 LAB — COMPREHENSIVE METABOLIC PANEL
ALBUMIN: 4.1 g/dL (ref 3.5–5.0)
ALT: 22 U/L (ref 17–63)
ANION GAP: 7 (ref 5–15)
AST: 22 U/L (ref 15–41)
Alkaline Phosphatase: 76 U/L (ref 38–126)
BILIRUBIN TOTAL: 0.9 mg/dL (ref 0.3–1.2)
BUN: 27 mg/dL — ABNORMAL HIGH (ref 6–20)
CO2: 29 mmol/L (ref 22–32)
Calcium: 9.2 mg/dL (ref 8.9–10.3)
Chloride: 99 mmol/L — ABNORMAL LOW (ref 101–111)
Creatinine, Ser: 2.15 mg/dL — ABNORMAL HIGH (ref 0.61–1.24)
GFR calc Af Amer: 34 mL/min — ABNORMAL LOW (ref >60)
GFR, EST NON AFRICAN AMERICAN: 30 mL/min — AB (ref >60)
Glucose, Bld: 286 mg/dL — ABNORMAL HIGH (ref 65–99)
POTASSIUM: 3.8 mmol/L (ref 3.5–5.1)
Sodium: 135 mmol/L (ref 135–145)
TOTAL PROTEIN: 7.4 g/dL (ref 6.5–8.1)

## 2016-10-08 LAB — LACTATE DEHYDROGENASE: LDH: 151 U/L (ref 98–192)

## 2016-10-08 MED ORDER — SODIUM CHLORIDE 0.9 % IV SOLN
375.0000 mg/m2 | Freq: Once | INTRAVENOUS | Status: AC
Start: 2016-10-08 — End: 2016-10-08
  Administered 2016-10-08: 1000 mg via INTRAVENOUS
  Filled 2016-10-08: qty 100

## 2016-10-08 MED ORDER — ACETAMINOPHEN 325 MG PO TABS
650.0000 mg | ORAL_TABLET | Freq: Once | ORAL | Status: AC
  Administered 2016-10-08: 650 mg via ORAL

## 2016-10-08 MED ORDER — DIPHENHYDRAMINE HCL 25 MG PO TABS
50.0000 mg | ORAL_TABLET | Freq: Once | ORAL | Status: AC
  Administered 2016-10-08: 50 mg via ORAL
  Filled 2016-10-08: qty 2

## 2016-10-08 MED ORDER — SODIUM CHLORIDE 0.9 % IV SOLN
INTRAVENOUS | Status: DC
  Administered 2016-10-08: 11:00:00 via INTRAVENOUS

## 2016-10-08 NOTE — Patient Instructions (Signed)
[facility] Discharge Instructions for Patients Receiving Chemotherapy   Beginning January 23rd 2017 lab work for the Cancer Center will be done in the  Main lab at  on 1st floor. If you have a lab appointment with the Cancer Center please come in thru the  Main Entrance and check in at the main information desk   Today you received the following chemotherapy agents   To help prevent nausea and vomiting after your treatment, we encourage you to take your nausea medication     If you develop nausea and vomiting, or diarrhea that is not controlled by your medication, call the clinic.  The clinic phone number is [phone]. Office hours are Monday-Friday 8:30am-5:00pm.  BELOW ARE SYMPTOMS THAT SHOULD BE REPORTED IMMEDIATELY:  *FEVER GREATER THAN 101.0 F  *CHILLS WITH OR WITHOUT FEVER  NAUSEA AND VOMITING THAT IS NOT CONTROLLED WITH YOUR NAUSEA MEDICATION  *UNUSUAL SHORTNESS OF BREATH  *UNUSUAL BRUISING OR BLEEDING  TENDERNESS IN MOUTH AND THROAT WITH OR WITHOUT PRESENCE OF ULCERS  *URINARY PROBLEMS  *BOWEL PROBLEMS  UNUSUAL RASH Items with * indicate a potential emergency and should be followed up as soon as possible. If you have an emergency after office hours please contact your primary care physician or go to the nearest emergency department.  Please call the clinic during office hours if you have any questions or concerns.   You may also contact the Patient Navigator at [phone] should you have any questions or need assistance in obtaining follow up care.      Resources For Cancer Patients and their Caregivers ? American Cancer Society: Can assist with transportation, wigs, general needs, runs Look Good Feel Better.        1-888-227-6333 ? Cancer Care: Provides financial assistance, online support groups, medication/co-pay assistance.  1-800-813-HOPE (4673) ? [facility] Assists [place] cancer  patients and their families through emotional , educational and financial support.  336-427-4357 ? [place] DSS Where to apply for food stamps, Medicaid and utility assistance. 336-342-1394 ? [facility]: Transportation to medical appointments. 336-347-2287 ? Social Security Administration: May apply for disability if have a Stage IV cancer. 336-342-7796 1-800-772-1213 ? [place] Aging, Disability and Transit Services: Assists with nutrition, care and transit needs. 336-349-2343         

## 2016-10-08 NOTE — Progress Notes (Signed)
Chemotherapy given today per orders. Patient tolerated it well without problems. Vitals stable and discharged home from clinic ambulatory with friend at side. Follow up as scheduled.

## 2016-10-21 ENCOUNTER — Ambulatory Visit: Payer: Medicare HMO | Admitting: Gastroenterology

## 2016-10-23 ENCOUNTER — Telehealth (HOSPITAL_COMMUNITY): Payer: Self-pay | Admitting: Oncology

## 2016-10-23 NOTE — Telephone Encounter (Signed)
Faxed med recs to [facility]

## 2016-11-02 ENCOUNTER — Encounter: Payer: Self-pay | Admitting: Gastroenterology

## 2016-11-02 ENCOUNTER — Ambulatory Visit: Payer: Medicare HMO | Admitting: Gastroenterology

## 2016-11-02 ENCOUNTER — Telehealth: Payer: Self-pay | Admitting: Gastroenterology

## 2016-11-02 NOTE — Telephone Encounter (Signed)
PATIENT WAS A NO SHOW AND LETTER SENT  °

## 2016-11-11 IMAGING — CT NM PET TUM IMG INITIAL (PI) SKULL BASE T - THIGH
8 of 9 series · 18 of 25 positions shown · non-contrast
Comparison: CT abdomen pelvis 07/23/2014

CLINICAL DATA: Initial treatment strategy for non-Hodgkin's
lymphoma.

EXAM:
NUCLEAR MEDICINE PET SKULL BASE TO THIGH
TECHNIQUE: 16.0 mCi F-18 FDG was injected intravenously. Full-ring PET imaging
was performed from the skull base to thigh after the radiotracer. CT
data was obtained and used for attenuation correction and anatomic
localization.
FASTING BLOOD GLUCOSE:  Value: 221 mg/dl

[Series 3: pet sk_thigh ac · axial · 5.0mm · 4.07mm/px · z∈[-1404,-516]mm · 3 of 223 slices shown]
[im 1/223]
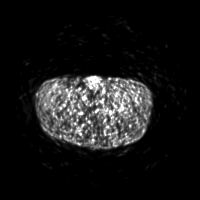
[im 149/223]
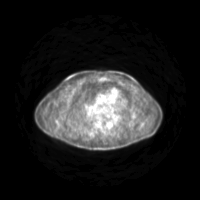
[im 223/223]
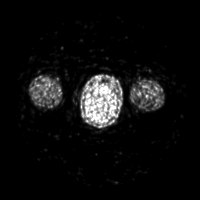

[Series 4: ct sk_thigh 5.0 hd_fov · axial · 5.0mm · 1.22mm/px · z∈[-1404,-744]mm · 3 of 222 slices shown]
[im 1/222  brain]
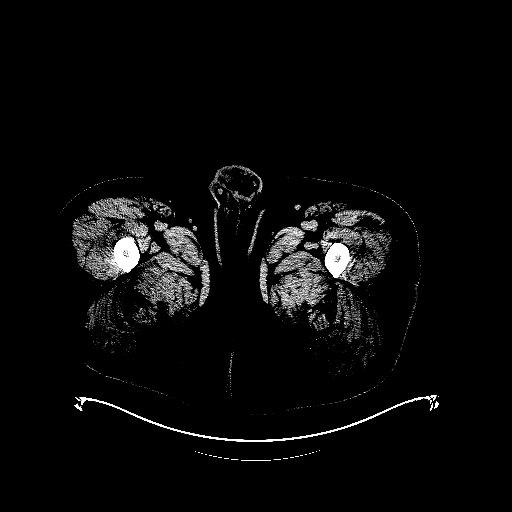
[im 111/222]
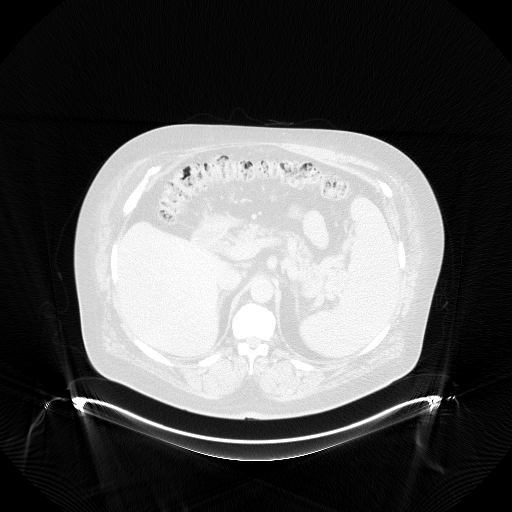
[im 166/222]
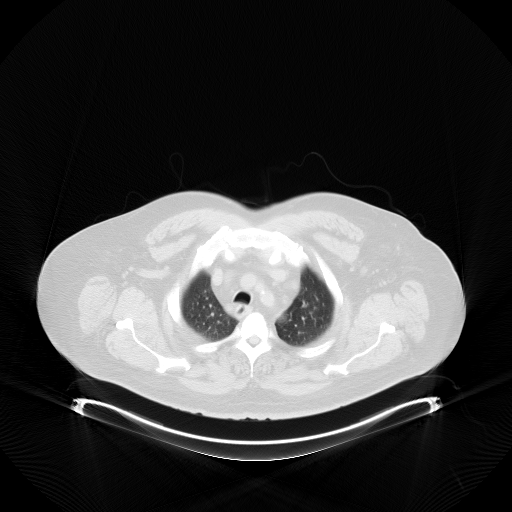

[Series 6: ct sk_thigh 5.0 b70f lung_bone · axial · 5.0mm · 0.75mm/px · 1 of 60 slices shown]
[im 1/60  bone]
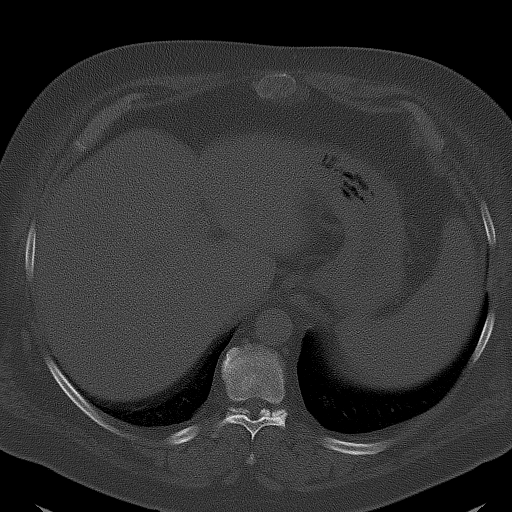

[Series 8: pet sk_thigh nac · axial · 5.0mm · 4.07mm/px · z∈[-1404,-516]mm · 4 of 223 slices shown]
[im 1/223]
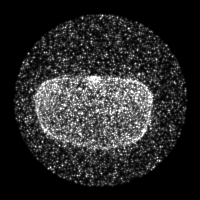
[im 56/223]
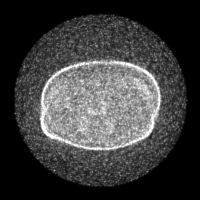
[im 167/223]
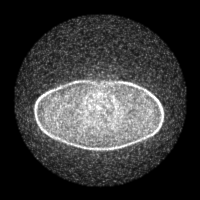
[im 223/223]
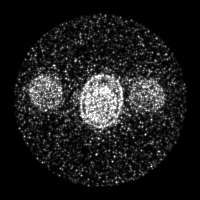

[Series 604: mip collection<mip range> · coronal · 1.84mm/px · 1 of 32 slices shown]
[im 1/32]
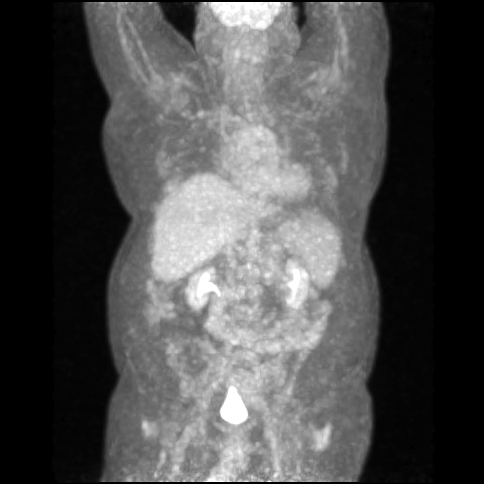

[Series 605: range-ct sk_thigh 5.0 hd_fov-cor-<alpha range> · 1 of 80 slices shown]
[im 80/80]
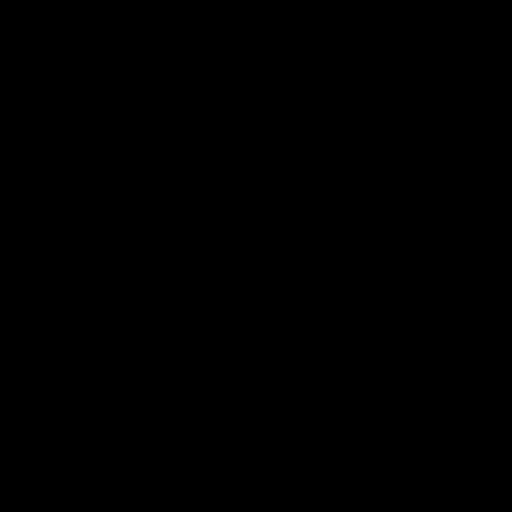

[Series 606: range-ct sk_thigh 5.0 hd_fov-tra-<alpha range> · 4 of 218 slices shown]
[im 1/218]
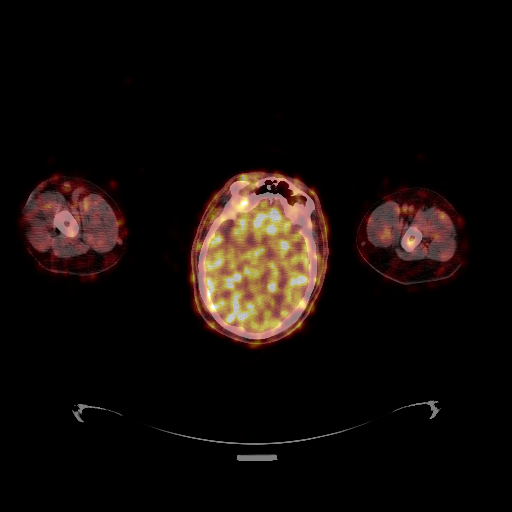
[im 109/218]
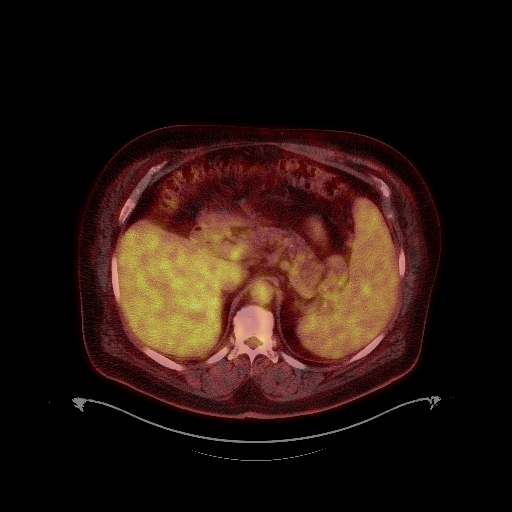
[im 163/218]
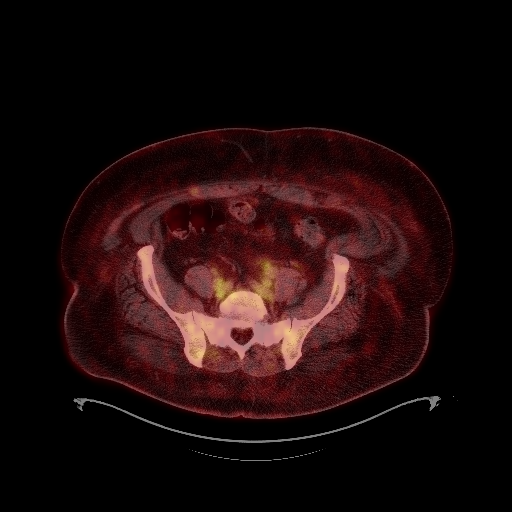
[im 218/218]
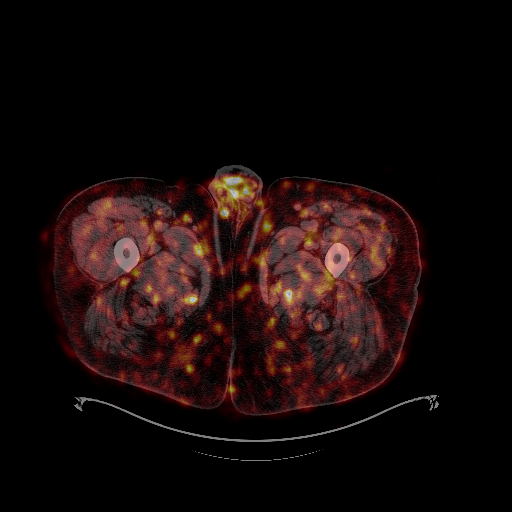

[Series 1069: results mm oncology reading · 0.52mm/px · 1 of 6 slices shown]
[im 1/6]
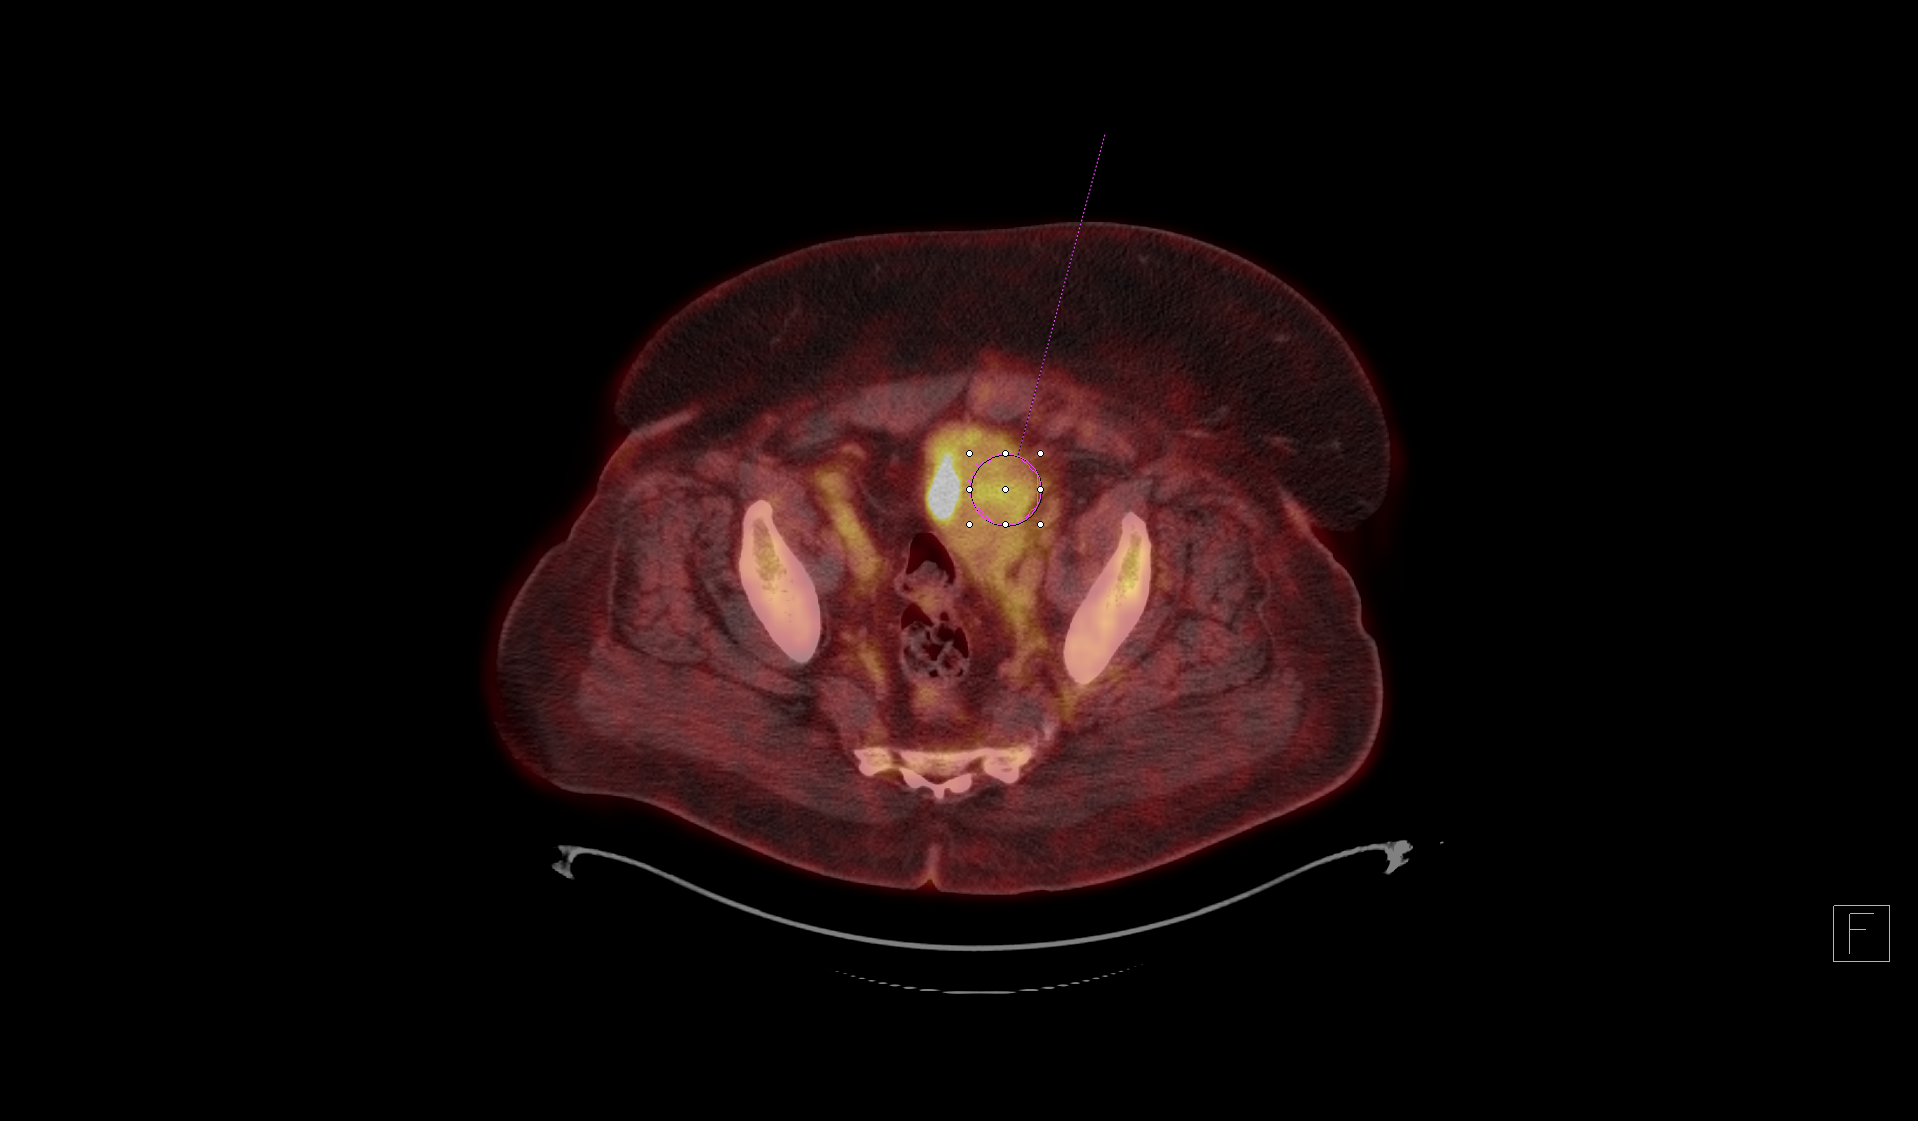

[18 of 25 positions shown; findings below may reference images not displayed]

FINDINGS: NECK

No hypermetabolic lymph nodes in the neck.

CHEST

No hypermetabolic mediastinal or hilar nodes. No suspicious
pulmonary nodules on the CT scan.

ABDOMEN/PELVIS

There is a hypermetabolic mass inseparable from the left aspect of
the bladder. The mass measures 8.3 x 5.9 cm and is relatively mild
in metabolic activity (SUV max 4.0). There are mildly metabolic left
external iliac lymph nodes. The highest metabolic pelvic lymph node
is a left common iliac lymph node measuring 16 mm on image 59,
series 4 with SUV max equal 3.3.

There is a hypermetabolic retroperitoneal lymph nodes anterior to
the left renal vein measuring 16 mm short axis on image 129, series
4 with SUV max equal all 4.8.

The spleen is normal volume and normal metabolic activity.
Pneumobilia noted. No focal hepatic lesion.

SKELETON

No focal hypermetabolic activity to suggest skeletal metastasis.
IMPRESSION: 1. Hypermetabolic mass left adjacent to the bladder. The mass is
mild to moderate in metabolic activity consistent with low-grade
lymphoma.
2. Mild left external iliac and common iliac metabolic adenopathy.
3. Single the probably retroperitoneal node anterior to the left
renal vein is moderately hypermetabolic.

## 2016-12-08 ENCOUNTER — Encounter (HOSPITAL_BASED_OUTPATIENT_CLINIC_OR_DEPARTMENT_OTHER): Payer: Medicare HMO

## 2016-12-08 ENCOUNTER — Encounter (HOSPITAL_COMMUNITY): Payer: Medicare HMO | Attending: Oncology | Admitting: Oncology

## 2016-12-08 ENCOUNTER — Encounter (HOSPITAL_COMMUNITY): Payer: Medicare HMO

## 2016-12-08 ENCOUNTER — Encounter (HOSPITAL_COMMUNITY): Payer: Self-pay

## 2016-12-08 ENCOUNTER — Encounter: Payer: Self-pay | Admitting: *Deleted

## 2016-12-08 VITALS — BP 122/76 | HR 60 | Temp 98.0°F | Resp 16 | Wt 304.4 lb

## 2016-12-08 DIAGNOSIS — C8580 Other specified types of non-Hodgkin lymphoma, unspecified site: Secondary | ICD-10-CM

## 2016-12-08 DIAGNOSIS — D696 Thrombocytopenia, unspecified: Secondary | ICD-10-CM

## 2016-12-08 DIAGNOSIS — Z5112 Encounter for antineoplastic immunotherapy: Secondary | ICD-10-CM

## 2016-12-08 DIAGNOSIS — E1165 Type 2 diabetes mellitus with hyperglycemia: Secondary | ICD-10-CM

## 2016-12-08 DIAGNOSIS — N189 Chronic kidney disease, unspecified: Secondary | ICD-10-CM

## 2016-12-08 LAB — COMPREHENSIVE METABOLIC PANEL
ALT: 13 U/L — AB (ref 17–63)
ANION GAP: 10 (ref 5–15)
AST: 18 U/L (ref 15–41)
Albumin: 3.9 g/dL (ref 3.5–5.0)
Alkaline Phosphatase: 91 U/L (ref 38–126)
BUN: 38 mg/dL — ABNORMAL HIGH (ref 6–20)
CHLORIDE: 95 mmol/L — AB (ref 101–111)
CO2: 30 mmol/L (ref 22–32)
CREATININE: 2.56 mg/dL — AB (ref 0.61–1.24)
Calcium: 9.3 mg/dL (ref 8.9–10.3)
GFR calc non Af Amer: 24 mL/min — ABNORMAL LOW (ref >60)
GFR, EST AFRICAN AMERICAN: 28 mL/min — AB (ref >60)
Glucose, Bld: 268 mg/dL — ABNORMAL HIGH (ref 65–99)
POTASSIUM: 3.2 mmol/L — AB (ref 3.5–5.1)
SODIUM: 135 mmol/L (ref 135–145)
Total Bilirubin: 1.2 mg/dL (ref 0.3–1.2)
Total Protein: 7.5 g/dL (ref 6.5–8.1)

## 2016-12-08 LAB — CBC WITH DIFFERENTIAL/PLATELET
Basophils Absolute: 0 10*3/uL (ref 0.0–0.1)
Basophils Relative: 0 %
EOS PCT: 4 %
Eosinophils Absolute: 0.4 10*3/uL (ref 0.0–0.7)
HCT: 47.1 % (ref 39.0–52.0)
Hemoglobin: 16.1 g/dL (ref 13.0–17.0)
LYMPHS ABS: 1.9 10*3/uL (ref 0.7–4.0)
Lymphocytes Relative: 21 %
MCH: 28.6 pg (ref 26.0–34.0)
MCHC: 34.2 g/dL (ref 30.0–36.0)
MCV: 83.8 fL (ref 78.0–100.0)
Monocytes Absolute: 0.8 10*3/uL (ref 0.1–1.0)
Monocytes Relative: 9 %
Neutro Abs: 6.1 10*3/uL (ref 1.7–7.7)
Neutrophils Relative %: 66 %
PLATELETS: 184 10*3/uL (ref 150–400)
RBC: 5.62 MIL/uL (ref 4.22–5.81)
RDW: 15.2 % (ref 11.5–15.5)
WBC: 9.2 10*3/uL (ref 4.0–10.5)

## 2016-12-08 LAB — LACTATE DEHYDROGENASE: LDH: 121 U/L (ref 98–192)

## 2016-12-08 MED ORDER — DIPHENHYDRAMINE HCL 25 MG PO CAPS
50.0000 mg | ORAL_CAPSULE | Freq: Once | ORAL | Status: AC
  Administered 2016-12-08: 50 mg via ORAL

## 2016-12-08 MED ORDER — POTASSIUM CHLORIDE CRYS ER 20 MEQ PO TBCR
40.0000 meq | EXTENDED_RELEASE_TABLET | Freq: Once | ORAL | Status: AC
  Administered 2016-12-08: 40 meq via ORAL

## 2016-12-08 MED ORDER — SODIUM CHLORIDE 0.9 % IV SOLN
375.0000 mg/m2 | Freq: Once | INTRAVENOUS | Status: AC
  Administered 2016-12-08: 1000 mg via INTRAVENOUS
  Filled 2016-12-08: qty 100

## 2016-12-08 MED ORDER — POTASSIUM CHLORIDE CRYS ER 20 MEQ PO TBCR
EXTENDED_RELEASE_TABLET | ORAL | Status: AC
  Filled 2016-12-08: qty 2

## 2016-12-08 MED ORDER — SODIUM CHLORIDE 0.9 % IV SOLN
Freq: Once | INTRAVENOUS | Status: AC
  Administered 2016-12-08: 11:00:00 via INTRAVENOUS

## 2016-12-08 MED ORDER — ACETAMINOPHEN 325 MG PO TABS
ORAL_TABLET | ORAL | Status: AC
  Filled 2016-12-08: qty 2

## 2016-12-08 MED ORDER — ACETAMINOPHEN 325 MG PO TABS
650.0000 mg | ORAL_TABLET | Freq: Once | ORAL | Status: AC
  Administered 2016-12-08: 650 mg via ORAL

## 2016-12-08 MED ORDER — DIPHENHYDRAMINE HCL 25 MG PO CAPS
ORAL_CAPSULE | ORAL | Status: AC
  Filled 2016-12-08: qty 2

## 2016-12-08 NOTE — Progress Notes (Signed)
[place] Clinical Social Work  Clinical Social Work was referred by [place] rounding in the treatment area.  Clinical Social Worker met with patient and his wife while at [facility] to offer support and assess for needs.  CSW introduced self, explained role of CSW/Pt and Family Support Team, support groups and other resources to assist. CSW provided handouts and CSW contact information. Pt and wife deny current concerns, they agree to reach out as needed.      Clinical Social Work interventions: Check in   [place], [place], OSW-C [place] Tuesdays   [phone]

## 2016-12-08 NOTE — Progress Notes (Signed)
Last rituxamab given today. Labs reviewed with MD prior to giving chemotherapy. Vitals stable and discharged home from clinic ambulatory. Follow up as scheduled.

## 2016-12-08 NOTE — Patient Instructions (Addendum)
[place] at [facility] Discharge Instructions  RECOMMENDATIONS MADE BY THE CONSULTANT AND ANY TEST RESULTS WILL BE SENT TO YOUR REFERRING PHYSICIAN.  You were seen today by Dr. Twana First We will schedule you for CT scan 1-2 days before you next visit Follow up in 2 months   Thank you for choosing [place] at [facility] to provide your oncology and hematology care.  To afford each patient quality time with our provider, please arrive at least 15 minutes before your scheduled appointment time.    If you have a lab appointment with the [facility] please come in thru the  Main Entrance and check in at the main information desk  You need to re-schedule your appointment should you arrive 10 or more minutes late.  We strive to give you quality time with our providers, and arriving late affects you and other patients whose appointments are after yours.  Also, if you no show three or more times for appointments you may be dismissed from the clinic at the providers discretion.     Again, thank you for choosing [facility].  Our hope is that these requests will decrease the amount of time that you wait before being seen by our physicians.       _____________________________________________________________  Should you have questions after your visit to [facility], please contact our office at [phone] between the hours of 8:30 a.m. and 4:30 p.m.  Voicemails left after 4:30 p.m. will not be returned until the following business day.  For prescription refill requests, have your pharmacy contact our office.       Resources For Cancer Patients and their Caregivers ? American Cancer Society: Can assist with transportation, wigs, general needs, runs Look Good Feel Better.        415-289-6060 ? Cancer Care: Provides financial assistance, online support groups, medication/co-pay assistance.  1-800-813-HOPE  715-576-2951) ? [place] Assists [place] cancer patients and their families through emotional , educational and financial support.  801-336-5522 ? [place] DSS Where to apply for food stamps, Medicaid and utility assistance. 4346104044 ? RCATS: Transportation to medical appointments. (906)078-2124 ? Social Security Administration: May apply for disability if have a Stage IV cancer. 706 607 4357 714-672-4527 ? [employer], Disability and Transit Services: Assists with nutrition, care and transit needs. [place] Support Programs: @10RELATIVEDAYS @ > Cancer Support Group  2nd Tuesday of the month 1pm-2pm, Journey Room  > Creative Journey  3rd Tuesday of the month 1130am-1pm, Journey Room  > Look Good Feel Better  1st Wednesday of the month 10am-12 noon, Journey Room (Call [place] to register [phone])

## 2016-12-08 NOTE — Progress Notes (Signed)
[place] PROGRESS NOTE  Patient Care Team: [name] as PCP - General (Family Medicine)  CHIEF COMPLAINTS:  Marginal Zone Lymphoma EGD 09/03/2014 negative for intestinal metaplasia, dysplasia or malignancy H pylori negative Colonoscopy on 09/03/2014 with 6 polyps (tubular adenomas), redundant left colon  Marginal zone lymphoma   Staging form: Lymphoid Neoplasms, AJCC 6th Edition     Clinical stage from 11/07/2014: Stage II - Unsigned    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[place])   07/23/2014 Imaging    CT C/A/P with soft tissue mass in pelvis, LN adjacent to distal esophagus, upper abdomen a partially calcified soft tissue mass      09/27/2014 Initial Biopsy    CT guided biopsy      10/17/2014 PET scan    Hypermetabolic mass left adjacent to the bladder. The mass is mild to moderate in metabolic activity consistent with low-grade lymphoma. Mild left external iliac and common iliac metabolic adenopathy.Single retroperitoneal node anterior to L renal vein      10/31/2014 - 11/21/2014 Chemotherapy    Single Agent Rituxan weekly x 4      01/21/2015 -  Antibody Plan         09/05/2015 - 09/23/2015 Radiation Therapy    Palliative XRT to palvic mass adjacent to bladder, 24 Gy in 12 fractions by Dr. Tammi Klippel      01/27/2016 PET scan    Hypermetabolic anterior left pelvic mass abutting the left superior bladder wall has decreased in size but mildly increased in metabolism, consistent with mild metabolic progression.      08/14/2016 PET scan    1. The mass along the left anterior urinary bladder is reduced in size and reduced in activity compared to the prior exam. The 2. Sub xiphoid os ossific structure with adjacent faint soft tissue density, the ossific structure has similar metabolic activity to other normal bony structures. 3. Mildly enlarged right subcarinal lymph node is not hypermetabolic and is below mediastinal activity. 4. Splenomegaly, without  splenic hypermetabolic activity.       HISTORY OF PRESENTING ILLNESS:  [name] [name] 71 y.o. male is here because of Stage II marginal zone lymphoma.  He was admitted to [facility] in December 2015 with abdominal pain. The pain was described as severe. He also reported episodes of vomiting. At presentation he had elevated liver function tests and bilirubin, CT scan of the abdomen and pelvis showed a possible bladder mass abutting the colon.He underwent a colonoscopy on 09/03/2014 with Dr. Oneida Alar. No mass was noted on exam. 6 polyps were removed all without dysplasia on final pathology, and a redundant left colon was noted. He ultimately underwent a CT-guided biopsy of the pelvic mass on February 25, with final pathology consistent with a low-grade non-Hodgkin's lymphoma, marginal zone type.  [name] [name] returns to the [place] alone today. He goes by the name of "[name] [name]."  He uses a cane to ambulate. He is scheduled for cycle 12 Rituximab q60d Maintenance. I personally reviewed and went over labs with the patient.   Patient notes he has been tolerating his rituximab well.  He states that he has been weak for a while now. His wife notes that he hasn't been back to normal since he received radiation.   Appetite is fine. Denies chest pain, sob, abdominal pain, chills, and problems with urinating.       MEDICAL HISTORY:  Past Medical History:  Diagnosis Date  . Cancer (HCC)    low grade  non hodgkin's lymphoma  . Chronic kidney disease    stage III kidney disease   . Diabetes mellitus   . Hypothyroidism   . Lymphoma ([place])   . Peripheral vascular disease (HCC)    neuropathy in toes   . Pneumonia    hx of   . S/P biopsy   . Shortness of breath    due ot pain in knees   . Sleep apnea    sleep study 3/29 13 ? location     SURGICAL HISTORY: Past Surgical History:  Procedure Laterality Date  . CHOLECYSTECTOMY  1992  . COLONOSCOPY N/A 09/03/2014   SLF:six colon polyps removed/small  internal hemorrhoids  . ESOPHAGOGASTRODUODENOSCOPY N/A 09/03/2014   SLF: mild gastritis/few gastric polyps  . TOTAL KNEE ARTHROPLASTY  11/10/2011   Procedure: TOTAL KNEE ARTHROPLASTY;  Surgeon: Mauri Pole, MD;  Location: [facility] ORS;  Service: Orthopedics;  Laterality: Right;    SOCIAL HISTORY: Social History   Social History  . Marital status: Divorced    Spouse name: N/A  . Number of children: N/A  . Years of education: N/A   Occupational History  . Not on file.   Social History Main Topics  . Smoking status: Former Smoker    Packs/day: 1.00    Years: 30.00    Types: Cigarettes    Quit date: 08/04/2007  . Smokeless tobacco: Never Used  . Alcohol use No  . Drug use: No  . Sexual activity: Yes   Other Topics Concern  . Not on file   Social History Narrative  . No narrative on file    FAMILY HISTORY: Family History  Problem Relation Age of Onset  . Cancer Mother     breast and lung  . Cancer Father     bladder  . Cancer Maternal Uncle     prostate  . Cancer Paternal Uncle     esophagus  . Colon cancer Neg Hx    indicated that the status of his mother is unknown. He indicated that the status of his father is unknown. He indicated that the status of his maternal uncle is unknown. He indicated that the status of his paternal uncle is unknown. He indicated that the status of his neg hx is unknown.    ALLERGIES:  has No Known Allergies.  MEDICATIONS:  Current Outpatient Prescriptions  Medication Sig Dispense Refill  . aspirin 81 MG tablet Take 81 mg by mouth daily.    . fluticasone (FLONASE) 50 MCG/ACT nasal spray Place 1 spray into both nostrils daily.    Marland Kitchen gabapentin (NEURONTIN) 100 MG capsule Take 100 mg by mouth at bedtime.    . insulin aspart (NOVOLOG FLEXPEN) 100 UNIT/ML FlexPen Inject into the skin. As needed for blood sugar    . insulin degludec (TRESIBA FLEXTOUCH) 100 UNIT/ML SOPN FlexTouch Pen Inject 0.5 mLs (50 Units total) into the skin daily at 10 pm.      . levothyroxine (SYNTHROID, LEVOTHROID) 100 MCG tablet Take 100 mcg by mouth daily before breakfast.    . levothyroxine (SYNTHROID, LEVOTHROID) 200 MCG tablet Take 200 mcg by mouth daily before breakfast.    . losartan (COZAAR) 25 MG tablet Take 25 mg by mouth every morning.    . montelukast (SINGULAIR) 10 MG tablet Take 10 mg by mouth.    . simvastatin (ZOCOR) 5 MG tablet Take 5 mg by mouth daily.    . traZODone (DESYREL) 50 MG tablet Take 50-100 mg by mouth at bedtime.    Marland Kitchen  traZODone (DESYREL) 50 MG tablet TAKE 1 TO 2 TABLETS AT BEDTIME FOR INSOMNIA    . verapamil (CALAN-SR) 240 MG CR tablet Take 240 mg by mouth daily.     No current facility-administered medications for this visit.    Facility-Administered Medications Ordered in Other Visits  Medication Dose Route Frequency Provider Last Rate Last Dose  . 0.9 %  sodium chloride infusion   Intravenous Once Twana First, MD      . riTUXimab (RITUXAN) 1,000 mg in sodium chloride 0.9 % 250 mL (2.8571 mg/mL) chemo infusion  375 mg/m2 (Treatment Plan Recorded) Intravenous Once Twana First, MD        Review of Systems  Constitutional: Positive for malaise/fatigue. Negative for chills.       Appetite is fine  HENT: Negative.   Eyes: Negative.   Respiratory: Negative.  Negative for shortness of breath.   Cardiovascular: Negative.  Negative for chest pain.  Gastrointestinal: Negative.  Negative for abdominal pain.  Genitourinary: Negative.  Negative for frequency and urgency.  Musculoskeletal: Negative.        Feet and knees hurt when walking on them.   Skin: Negative.   Endo/Heme/Allergies: Negative.   Psychiatric/Behavioral: Negative.   All other systems reviewed and are negative.    PHYSICAL EXAMINATION: ECOG PERFORMANCE STATUS: 1 - Symptomatic but completely ambulatory   Physical Exam  Constitutional: He is oriented to person, place, and time and well-developed, well-nourished, and in no distress.  HENT:  Head: Normocephalic  and atraumatic.  Mouth/Throat: No oropharyngeal exudate.  Eyes: Conjunctivae and EOM are normal. Pupils are equal, round, and reactive to light. No scleral icterus.  Neck: Normal range of motion. Neck supple.  Cardiovascular: Normal rate, regular rhythm and normal heart sounds.   Pulmonary/Chest: Effort normal and breath sounds normal. No respiratory distress.  Abdominal: Soft. Bowel sounds are normal. He exhibits no distension and no mass. There is no tenderness. There is no rebound and no guarding.  Musculoskeletal: Normal range of motion.  Chronic LE skin/vascular changes  Lymphadenopathy:    He has no cervical adenopathy.  Neurological: He is alert and oriented to person, place, and time. Gait normal.  Skin: Skin is warm and dry.  Psychiatric: Mood, memory, affect and judgment normal.  Nursing note and vitals reviewed.   LABORATORY DATA:  I have reviewed the data as listed  Results for HARLAND, AGUINIGA (MRN [id number]) as of 12/08/2016 09:06  Ref. Range 10/08/2016 09:35  Sodium Latest Ref Range: 135 - 145 mmol/L 135  Potassium Latest Ref Range: 3.5 - 5.1 mmol/L 3.8  Chloride Latest Ref Range: 101 - 111 mmol/L 99 (L)  CO2 Latest Ref Range: 22 - 32 mmol/L 29  Glucose Latest Ref Range: 65 - 99 mg/dL 286 (H)  BUN Latest Ref Range: 6 - 20 mg/dL 27 (H)  Creatinine Latest Ref Range: 0.61 - 1.24 mg/dL 2.15 (H)  Calcium Latest Ref Range: 8.9 - 10.3 mg/dL 9.2  Anion gap Latest Ref Range: 5 - 15  7  Alkaline Phosphatase Latest Ref Range: 38 - 126 U/L 76  Albumin Latest Ref Range: 3.5 - 5.0 g/dL 4.1  AST Latest Ref Range: 15 - 41 U/L 22  ALT Latest Ref Range: 17 - 63 U/L 22  Total Protein Latest Ref Range: 6.5 - 8.1 g/dL 7.4  Total Bilirubin Latest Ref Range: 0.3 - 1.2 mg/dL 0.9  EGFR (African American) Latest Ref Range: >60 mL/min 34 (L)  EGFR (Non-African Amer.) Latest Ref Range: >60 mL/min 30 (  L)  LDH Latest Ref Range: 98 - 192 U/L 151  WBC Latest Ref Range: 4.0 - 10.5 K/uL 7.4  RBC Latest  Ref Range: 4.22 - 5.81 MIL/uL 5.34  Hemoglobin Latest Ref Range: 13.0 - 17.0 g/dL 16.1  HCT Latest Ref Range: 39.0 - 52.0 % 46.6  MCV Latest Ref Range: 78.0 - 100.0 fL 87.3  MCH Latest Ref Range: 26.0 - 34.0 pg 30.1  MCHC Latest Ref Range: 30.0 - 36.0 g/dL 34.5  RDW Latest Ref Range: 11.5 - 15.5 % 14.8  Platelets Latest Ref Range: 150 - 400 K/uL 128 (L)  Neutrophils Latest Units: % 67  Lymphocytes Latest Units: % 19  Monocytes Relative Latest Units: % 9  Eosinophil Latest Units: % 4  Basophil Latest Units: % 1  NEUT# Latest Ref Range: 1.7 - 7.7 K/uL 5.1  Lymphocyte # Latest Ref Range: 0.7 - 4.0 K/uL 1.4  Monocyte # Latest Ref Range: 0.1 - 1.0 K/uL 0.7  Eosinophils Absolute Latest Ref Range: 0.0 - 0.7 K/uL 0.3  Basophils Absolute Latest Ref Range: 0.0 - 0.1 K/uL 0.1     PATHOLOGY:  Soft tissue mass, biopsy, adjacent to urinary bladder - ATYPICAL LYMPHOID PROLIFERATION CONSISTENT WITH NON-HODGKIN'S B-CELL LYMPHOMA. - SEE ONCOLOGY TABLE. Histologic type: Non-Hodgkin's lymphoma, see comment. Grade (if applicable): Low grade. Flow cytometry: No tissue is available for analysis since the specimen was received in formalin. Immunohistochemical stains: BCL-6, CD3, CD5, CD10, CD20, CD21, CD34, CD43, CD79a, CD138, Cyclin D-1, kappa, lambda, TdT, and Ki-67 with appropriate controls. Touch preps/imprints: Not performed. Comments: The sections show needle core biopsy of soft tissue displaying a very dense and relatively monomorphic infiltrate of small lymphoid cells with high nuclear cytoplasmic ratio, round to irregular nuclei, dense chromatin, and small to inconspicuous nucleoli. The appearance is diffuse with lack of obvious follicular structures or proliferation centers. No necrosis or conspicuous mitosis is identified. To further evaluate this process, immunohistochemical stains were performed and show that the overwhelming majority of lymphocytes consist of B-cells, as highlighted with  CD20 and CD79a. B-lymphocytes show no significant staining with BCL-6, CD10, CD5, Cyclin D-1, CD34, or TdT. CD21 highlights scattered small foci of dendritic networks throughout the core biopsies. CD138 highlights a minor plasma cell component, which consist of scattered cells and variably sized but predominantly small clusters. Kappa and lambda stains failed to show any significant staining and are considered non-contributory. Ki-67 shows very low expression (less than 5%). There is an admixed minor T-cell component present as seen with CD3, CD5, and CD43. The overall features are consistent with low grade non-Hodgkin's B-cell lymphoma and the overall phenotypic features favor marginal zone type. Clinical correlation is strongly recommended. (BNS:ds 10/01/14)   RADIOGRAPHIC STUDIES: I have personally reviewed the radiological images as listed and agreed with the findings in the report. Study Result   CLINICAL DATA:  Subsequent treatment strategy for mantle cell non-Hodgkin' s lymphoma. Pelvic mass.  EXAM: NUCLEAR MEDICINE PET SKULL BASE TO THIGH  TECHNIQUE: 16.2 mCi F-18 FDG was injected intravenously. Full-ring PET imaging was performed from the skull base to thigh after the radiotracer. CT data was obtained and used for attenuation correction and anatomic localization.  FASTING BLOOD GLUCOSE:  Value: 151 mg/dl  COMPARISON:  Multiple exams, including 01/27/2016  FINDINGS: NECK  No hypermetabolic lymph nodes in the neck.  CHEST  Stable tiny punctate 3 mm right upper lobe pulmonary nodule, no associated measurable hypermetabolic adenopathy. 1.7 cm right subcarinal node, previously the same by my measurements, maximum  SUV 3.7, less than background mediastinal blood pool activity of 4.5.  Coronary, aortic arch, and branch vessel atherosclerotic vascular disease. Mild cardiomegaly.  ABDOMEN/PELVIS  Sub xiphoid ossific structure with adjacent soft tissue density  not changed from prior, maximum SUV 4.3, which is similar to other bony structures.  Elongated 0.9 cm in short axis lymph node above the left renal vein, not hypermetabolic.  The spleen measures 18.2 by 6.1 by 15.0 cm (volume = 870 cm^3) but is not hypermetabolic.  Aortoiliac atherosclerotic vascular disease.  Mass along the left anterior upper urinary bladder wall observed, mildly reduced size subjectively compared to prior, maximum SUV approximately 4.8 (formerly 6.3).  Trace pneumobilia, less than previous.  SKELETON  No focal hypermetabolic activity to suggest skeletal metastasis.  IMPRESSION: 1. The mass along the left anterior urinary bladder is reduced in size and reduced in activity compared to the prior exam. The 2. Sub xiphoid os ossific structure with adjacent faint soft tissue density, the ossific structure has similar metabolic activity to other normal bony structures. 3. Mildly enlarged right subcarinal lymph node is not hypermetabolic and is below mediastinal activity. 4. Splenomegaly, without splenic hypermetabolic activity. 5. Other imaging findings of potential clinical significance: Coronary, aortic arch, and branch vessel atherosclerotic vascular disease. Aortoiliac atherosclerotic vascular disease. Mild cardiomegaly. Trace pneumobilia, less than previous.   Electronically Signed   By: Van Clines M.D.   On: 08/14/2016 13:12      ASSESSMENT & PLAN:  Stage II marginal zone lymphoma CKD Mild Thrombocytopenia, intermittent Diabetes Hyperglycemia Maintenance rituxan completed 12/08/16   He will complete 2 years of maintenance rituxan today.  Patient has been tolerating his rituximab well. Today is his last cycle.   Labs reviewed. Results noted above.   Plan to order restaging CT C/A/P to be done 1-2 days prior to his next visit.  RTC in 2 months with repeat CT scans.   NCCN Surveillance Guidelines for Marginal Zone  Lymphoma: H&P and labs every 3-6 months for 5 years then annually or as clinically indicated. Surveillance imaging: up to 2 years post completion of treatment: CT C/A/P with contrast no more than every 6 months. >2 years: CT scan no more than annually.  All questions were answered. The patient knows to call the clinic with any problems, questions or concerns.   This note was electronically signed.   This document serves as a record of services personally performed by Twana First, MD. It was created on her behalf by Shirlean Mylar, a trained medical scribe. The creation of this record is based on the scribe's personal observations and the provider's statements to them. This document has been checked and approved by the attending provider.  I have reviewed the above documentation for accuracy and completeness, and I agree with the above.

## 2016-12-08 NOTE — Patient Instructions (Signed)
[facility] Discharge Instructions for Patients Receiving Chemotherapy   Beginning January 23rd 2017 lab work for the Cancer Center will be done in the  Main lab at [place] on 1st floor. If you have a lab appointment with the Cancer Center please come in thru the  Main Entrance and check in at the main information desk   Today you received the following chemotherapy agents   To help prevent nausea and vomiting after your treatment, we encourage you to take your nausea medication     If you develop nausea and vomiting, or diarrhea that is not controlled by your medication, call the clinic.  The clinic phone number is [phone]. Office hours are Monday-Friday 8:30am-5:00pm.  BELOW ARE SYMPTOMS THAT SHOULD BE REPORTED IMMEDIATELY:  *FEVER GREATER THAN 101.0 F  *CHILLS WITH OR WITHOUT FEVER  NAUSEA AND VOMITING THAT IS NOT CONTROLLED WITH YOUR NAUSEA MEDICATION  *UNUSUAL SHORTNESS OF BREATH  *UNUSUAL BRUISING OR BLEEDING  TENDERNESS IN MOUTH AND THROAT WITH OR WITHOUT PRESENCE OF ULCERS  *URINARY PROBLEMS  *BOWEL PROBLEMS  UNUSUAL RASH Items with * indicate a potential emergency and should be followed up as soon as possible. If you have an emergency after office hours please contact your primary care physician or go to the nearest emergency department.  Please call the clinic during office hours if you have any questions or concerns.   You may also contact the Patient Navigator at [phone] should you have any questions or need assistance in obtaining follow up care.      Resources For Cancer Patients and their Caregivers ? American Cancer Society: Can assist with transportation, wigs, general needs, runs Look Good Feel Better.        1-888-227-6333 ? Cancer Care: Provides financial assistance, online support groups, medication/co-pay assistance.  1-800-813-HOPE (4673) ? [facility] Assists [place] cancer  patients and their families through emotional , educational and financial support.  336-427-4357 ? [place] DSS Where to apply for food stamps, Medicaid and utility assistance. 336-342-1394 ? RCATS: Transportation to medical appointments. 336-347-2287 ? Social Security Administration: May apply for disability if have a Stage IV cancer. 336-342-7796 1-800-772-1213 ? [place] Aging, Disability and Transit Services: Assists with nutrition, care and transit needs. [phone]         

## 2017-01-19 ENCOUNTER — Encounter (HOSPITAL_COMMUNITY): Payer: Self-pay | Admitting: Emergency Medicine

## 2017-01-19 ENCOUNTER — Emergency Department (HOSPITAL_COMMUNITY): Payer: Medicare HMO

## 2017-01-19 ENCOUNTER — Other Ambulatory Visit: Payer: Self-pay

## 2017-01-19 ENCOUNTER — Inpatient Hospital Stay (HOSPITAL_COMMUNITY)
Admission: EM | Admit: 2017-01-19 | Discharge: 2017-01-22 | DRG: 246 | Disposition: A | Discharge status: Home Health | Payer: Medicare HMO | Attending: Internal Medicine | Admitting: Internal Medicine

## 2017-01-19 DIAGNOSIS — N189 Chronic kidney disease, unspecified: Secondary | ICD-10-CM | POA: Diagnosis present

## 2017-01-19 DIAGNOSIS — E1122 Type 2 diabetes mellitus with diabetic chronic kidney disease: Secondary | ICD-10-CM | POA: Diagnosis not present

## 2017-01-19 DIAGNOSIS — I214 Non-ST elevation (NSTEMI) myocardial infarction: Secondary | ICD-10-CM | POA: Diagnosis not present

## 2017-01-19 DIAGNOSIS — N184 Chronic kidney disease, stage 4 (severe): Secondary | ICD-10-CM | POA: Diagnosis present

## 2017-01-19 DIAGNOSIS — Z955 Presence of coronary angioplasty implant and graft: Secondary | ICD-10-CM

## 2017-01-19 DIAGNOSIS — Z79899 Other long term (current) drug therapy: Secondary | ICD-10-CM

## 2017-01-19 DIAGNOSIS — E039 Hypothyroidism, unspecified: Secondary | ICD-10-CM | POA: Diagnosis present

## 2017-01-19 DIAGNOSIS — Z8572 Personal history of non-Hodgkin lymphomas: Secondary | ICD-10-CM | POA: Diagnosis not present

## 2017-01-19 DIAGNOSIS — Z803 Family history of malignant neoplasm of breast: Secondary | ICD-10-CM

## 2017-01-19 DIAGNOSIS — R627 Adult failure to thrive: Secondary | ICD-10-CM | POA: Diagnosis present

## 2017-01-19 DIAGNOSIS — Z7982 Long term (current) use of aspirin: Secondary | ICD-10-CM | POA: Diagnosis not present

## 2017-01-19 DIAGNOSIS — Z9221 Personal history of antineoplastic chemotherapy: Secondary | ICD-10-CM

## 2017-01-19 DIAGNOSIS — I493 Ventricular premature depolarization: Secondary | ICD-10-CM | POA: Diagnosis present

## 2017-01-19 DIAGNOSIS — I129 Hypertensive chronic kidney disease with stage 1 through stage 4 chronic kidney disease, or unspecified chronic kidney disease: Secondary | ICD-10-CM | POA: Diagnosis not present

## 2017-01-19 DIAGNOSIS — G4733 Obstructive sleep apnea (adult) (pediatric): Secondary | ICD-10-CM | POA: Diagnosis present

## 2017-01-19 DIAGNOSIS — I10 Essential (primary) hypertension: Secondary | ICD-10-CM | POA: Diagnosis not present

## 2017-01-19 DIAGNOSIS — I2510 Atherosclerotic heart disease of native coronary artery without angina pectoris: Secondary | ICD-10-CM | POA: Diagnosis not present

## 2017-01-19 DIAGNOSIS — E1151 Type 2 diabetes mellitus with diabetic peripheral angiopathy without gangrene: Secondary | ICD-10-CM | POA: Diagnosis present

## 2017-01-19 DIAGNOSIS — Z96651 Presence of right artificial knee joint: Secondary | ICD-10-CM | POA: Diagnosis present

## 2017-01-19 DIAGNOSIS — Z9081 Acquired absence of spleen: Secondary | ICD-10-CM | POA: Diagnosis not present

## 2017-01-19 DIAGNOSIS — Z801 Family history of malignant neoplasm of trachea, bronchus and lung: Secondary | ICD-10-CM

## 2017-01-19 DIAGNOSIS — Z6841 Body Mass Index (BMI) 40.0 and over, adult: Secondary | ICD-10-CM

## 2017-01-19 DIAGNOSIS — E785 Hyperlipidemia, unspecified: Secondary | ICD-10-CM | POA: Diagnosis present

## 2017-01-19 DIAGNOSIS — N183 Chronic kidney disease, stage 3 (moderate): Secondary | ICD-10-CM | POA: Diagnosis not present

## 2017-01-19 DIAGNOSIS — C8580 Other specified types of non-Hodgkin lymphoma, unspecified site: Secondary | ICD-10-CM | POA: Diagnosis present

## 2017-01-19 DIAGNOSIS — I255 Ischemic cardiomyopathy: Secondary | ICD-10-CM | POA: Diagnosis not present

## 2017-01-19 DIAGNOSIS — I5021 Acute systolic (congestive) heart failure: Secondary | ICD-10-CM | POA: Diagnosis present

## 2017-01-19 DIAGNOSIS — Z794 Long term (current) use of insulin: Secondary | ICD-10-CM

## 2017-01-19 DIAGNOSIS — Z87891 Personal history of nicotine dependence: Secondary | ICD-10-CM | POA: Diagnosis not present

## 2017-01-19 DIAGNOSIS — Z923 Personal history of irradiation: Secondary | ICD-10-CM | POA: Diagnosis not present

## 2017-01-19 DIAGNOSIS — R7989 Other specified abnormal findings of blood chemistry: Secondary | ICD-10-CM | POA: Diagnosis present

## 2017-01-19 DIAGNOSIS — I130 Hypertensive heart and chronic kidney disease with heart failure and stage 1 through stage 4 chronic kidney disease, or unspecified chronic kidney disease: Secondary | ICD-10-CM | POA: Diagnosis present

## 2017-01-19 DIAGNOSIS — I351 Nonrheumatic aortic (valve) insufficiency: Secondary | ICD-10-CM | POA: Diagnosis not present

## 2017-01-19 DIAGNOSIS — R778 Other specified abnormalities of plasma proteins: Secondary | ICD-10-CM | POA: Diagnosis present

## 2017-01-19 DIAGNOSIS — Z8052 Family history of malignant neoplasm of bladder: Secondary | ICD-10-CM

## 2017-01-19 DIAGNOSIS — R748 Abnormal levels of other serum enzymes: Secondary | ICD-10-CM | POA: Diagnosis not present

## 2017-01-19 LAB — CBC
HCT: 44.3 % (ref 39.0–52.0)
Hemoglobin: 15.3 g/dL (ref 13.0–17.0)
MCH: 29.8 pg (ref 26.0–34.0)
MCHC: 34.5 g/dL (ref 30.0–36.0)
MCV: 86.2 fL (ref 78.0–100.0)
PLATELETS: 138 10*3/uL — AB (ref 150–400)
RBC: 5.14 MIL/uL (ref 4.22–5.81)
RDW: 15.7 % — AB (ref 11.5–15.5)
WBC: 11.6 10*3/uL — AB (ref 4.0–10.5)

## 2017-01-19 LAB — HEPATIC FUNCTION PANEL
ALK PHOS: 66 U/L (ref 38–126)
ALT: 30 U/L (ref 17–63)
AST: 109 U/L — AB (ref 15–41)
Albumin: 4 g/dL (ref 3.5–5.0)
BILIRUBIN DIRECT: 0.3 mg/dL (ref 0.1–0.5)
BILIRUBIN INDIRECT: 1.2 mg/dL — AB (ref 0.3–0.9)
BILIRUBIN TOTAL: 1.5 mg/dL — AB (ref 0.3–1.2)
Total Protein: 7.1 g/dL (ref 6.5–8.1)

## 2017-01-19 LAB — URINALYSIS, ROUTINE W REFLEX MICROSCOPIC
BILIRUBIN URINE: NEGATIVE
GLUCOSE, UA: 150 mg/dL — AB
Ketones, ur: NEGATIVE mg/dL
LEUKOCYTES UA: NEGATIVE
NITRITE: NEGATIVE
PH: 5 (ref 5.0–8.0)
Protein, ur: 100 mg/dL — AB
SPECIFIC GRAVITY, URINE: 1.021 (ref 1.005–1.030)

## 2017-01-19 LAB — MRSA PCR SCREENING: MRSA BY PCR: NEGATIVE

## 2017-01-19 LAB — BASIC METABOLIC PANEL
Anion gap: 12 (ref 5–15)
BUN: 27 mg/dL — ABNORMAL HIGH (ref 6–20)
CHLORIDE: 101 mmol/L (ref 101–111)
CO2: 22 mmol/L (ref 22–32)
CREATININE: 2.16 mg/dL — AB (ref 0.61–1.24)
Calcium: 9.3 mg/dL (ref 8.9–10.3)
GFR, EST AFRICAN AMERICAN: 34 mL/min — AB (ref >60)
GFR, EST NON AFRICAN AMERICAN: 29 mL/min — AB (ref >60)
Glucose, Bld: 231 mg/dL — ABNORMAL HIGH (ref 65–99)
POTASSIUM: 3.8 mmol/L (ref 3.5–5.1)
SODIUM: 135 mmol/L (ref 135–145)

## 2017-01-19 LAB — LIPASE, BLOOD: Lipase: 25 U/L (ref 11–51)

## 2017-01-19 LAB — CBG MONITORING, ED: GLUCOSE-CAPILLARY: 227 mg/dL — AB (ref 65–99)

## 2017-01-19 LAB — TROPONIN I
Troponin I: 22.33 ng/mL (ref <0.03)
Troponin I: 26.31 ng/mL (ref <0.03)

## 2017-01-19 LAB — GLUCOSE, CAPILLARY: Glucose-Capillary: 205 mg/dL — ABNORMAL HIGH (ref 65–99)

## 2017-01-19 LAB — I-STAT CG4 LACTIC ACID, ED: LACTIC ACID, VENOUS: 1.84 mmol/L (ref 0.5–1.9)

## 2017-01-19 LAB — TSH: TSH: 1.541 u[IU]/mL (ref 0.350–4.500)

## 2017-01-19 MED ORDER — LEVOTHYROXINE SODIUM 100 MCG PO TABS
200.0000 ug | ORAL_TABLET | Freq: Every day | ORAL | Status: DC
  Administered 2017-01-20: 200 ug via ORAL
  Filled 2017-01-19: qty 2

## 2017-01-19 MED ORDER — SODIUM CHLORIDE 0.9% FLUSH: 3.0000 mL | Freq: Two times a day (BID) | INTRAVENOUS | Status: DC

## 2017-01-19 MED ORDER — ASPIRIN EC 81 MG PO TBEC
81.0000 mg | DELAYED_RELEASE_TABLET | Freq: Every day | ORAL | Status: DC
  Administered 2017-01-20 – 2017-01-22 (×3): 81 mg via ORAL
  Filled 2017-01-19 (×3): qty 1

## 2017-01-19 MED ORDER — ALBUTEROL SULFATE (2.5 MG/3ML) 0.083% IN NEBU: 2.5000 mg | INHALATION_SOLUTION | RESPIRATORY_TRACT | Status: DC | PRN

## 2017-01-19 MED ORDER — ONDANSETRON HCL 4 MG/2ML IJ SOLN: 4.0000 mg | Freq: Four times a day (QID) | INTRAMUSCULAR | Status: DC | PRN

## 2017-01-19 MED ORDER — HEPARIN BOLUS VIA INFUSION
4000.0000 [IU] | Freq: Once | INTRAVENOUS | Status: AC
  Administered 2017-01-19: 4000 [IU] via INTRAVENOUS

## 2017-01-19 MED ORDER — PNEUMOCOCCAL VAC POLYVALENT 25 MCG/0.5ML IJ INJ: 0.5000 mL | INJECTION | INTRAMUSCULAR | Status: DC

## 2017-01-19 MED ORDER — METOPROLOL TARTRATE 12.5 MG HALF TABLET
12.5000 mg | ORAL_TABLET | Freq: Two times a day (BID) | ORAL | Status: DC
  Administered 2017-01-19 – 2017-01-22 (×6): 12.5 mg via ORAL
  Filled 2017-01-19 (×7): qty 1

## 2017-01-19 MED ORDER — ASPIRIN 81 MG PO TABS: 81.0000 mg | ORAL_TABLET | Freq: Every day | ORAL | Status: DC

## 2017-01-19 MED ORDER — TRAZODONE HCL 50 MG PO TABS
50.0000 mg | ORAL_TABLET | Freq: Every evening | ORAL | Status: DC | PRN
  Administered 2017-01-19 – 2017-01-22 (×3): 50 mg via ORAL
  Filled 2017-01-19 (×3): qty 1

## 2017-01-19 MED ORDER — ASPIRIN 81 MG PO CHEW
324.0000 mg | CHEWABLE_TABLET | Freq: Once | ORAL | Status: AC
  Administered 2017-01-19: 324 mg via ORAL
  Filled 2017-01-19: qty 4

## 2017-01-19 MED ORDER — CLOPIDOGREL BISULFATE 75 MG PO TABS: 75.0000 mg | ORAL_TABLET | Freq: Once | ORAL | Status: DC

## 2017-01-19 MED ORDER — SODIUM CHLORIDE 0.9% FLUSH
3.0000 mL | Freq: Two times a day (BID) | INTRAVENOUS | Status: DC
  Administered 2017-01-22: 3 mL via INTRAVENOUS

## 2017-01-19 MED ORDER — HEPARIN (PORCINE) IN NACL 100-0.45 UNIT/ML-% IJ SOLN
1750.0000 [IU]/h | INTRAMUSCULAR | Status: DC
  Administered 2017-01-19: 1000 [IU]/h via INTRAVENOUS
  Administered 2017-01-20: 1400 [IU]/h via INTRAVENOUS
  Filled 2017-01-19 (×2): qty 250

## 2017-01-19 MED ORDER — ACETAMINOPHEN 650 MG RE SUPP: 650.0000 mg | Freq: Four times a day (QID) | RECTAL | Status: DC | PRN

## 2017-01-19 MED ORDER — SENNA 8.6 MG PO TABS
1.0000 | ORAL_TABLET | Freq: Two times a day (BID) | ORAL | Status: DC
  Administered 2017-01-19 – 2017-01-22 (×6): 8.6 mg via ORAL
  Filled 2017-01-19 (×7): qty 1

## 2017-01-19 MED ORDER — ONDANSETRON HCL 4 MG PO TABS: 4.0000 mg | ORAL_TABLET | Freq: Four times a day (QID) | ORAL | Status: DC | PRN

## 2017-01-19 MED ORDER — ACETAMINOPHEN 325 MG PO TABS: 650.0000 mg | ORAL_TABLET | Freq: Four times a day (QID) | ORAL | Status: DC | PRN

## 2017-01-19 MED ORDER — LEVOTHYROXINE SODIUM 50 MCG PO TABS: 100.0000 ug | ORAL_TABLET | Freq: Every day | ORAL | Status: DC

## 2017-01-19 MED ORDER — POLYETHYLENE GLYCOL 3350 17 G PO PACK: 17.0000 g | PACK | Freq: Every day | ORAL | Status: DC | PRN

## 2017-01-19 MED ORDER — ATORVASTATIN CALCIUM 40 MG PO TABS
40.0000 mg | ORAL_TABLET | Freq: Every day | ORAL | Status: DC
  Administered 2017-01-20 – 2017-01-21 (×2): 40 mg via ORAL
  Filled 2017-01-19 (×2): qty 1

## 2017-01-19 MED ORDER — OMEGA-3-ACID ETHYL ESTERS 1 G PO CAPS
1.0000 | ORAL_CAPSULE | Freq: Every day | ORAL | Status: DC
  Administered 2017-01-20 – 2017-01-22 (×3): 1 g via ORAL
  Filled 2017-01-19 (×3): qty 1

## 2017-01-19 MED ORDER — SODIUM CHLORIDE 0.9 % IV SOLN: 250.0000 mL | INTRAVENOUS | Status: DC | PRN

## 2017-01-19 MED ORDER — FLUTICASONE PROPIONATE 50 MCG/ACT NA SUSP
1.0000 | Freq: Every day | NASAL | Status: DC
  Administered 2017-01-20 – 2017-01-22 (×3): 1 via NASAL
  Filled 2017-01-19: qty 16

## 2017-01-19 MED ORDER — INSULIN GLARGINE 100 UNIT/ML ~~LOC~~ SOLN
40.0000 [IU] | Freq: Every day | SUBCUTANEOUS | Status: DC
  Administered 2017-01-20 – 2017-01-21 (×2): 40 [IU] via SUBCUTANEOUS
  Filled 2017-01-19 (×4): qty 0.4

## 2017-01-19 MED ORDER — SODIUM CHLORIDE 0.9% FLUSH: 3.0000 mL | INTRAVENOUS | Status: DC | PRN

## 2017-01-19 MED ORDER — ATORVASTATIN CALCIUM 40 MG PO TABS
80.0000 mg | ORAL_TABLET | Freq: Once | ORAL | Status: DC
  Filled 2017-01-19: qty 2

## 2017-01-19 MED ORDER — LOSARTAN POTASSIUM 25 MG PO TABS
25.0000 mg | ORAL_TABLET | Freq: Every morning | ORAL | Status: DC
  Administered 2017-01-20: 25 mg via ORAL
  Filled 2017-01-19: qty 1

## 2017-01-19 MED ORDER — SODIUM CHLORIDE 0.9 % IV BOLUS (SEPSIS)
1000.0000 mL | Freq: Once | INTRAVENOUS | Status: AC
  Administered 2017-01-19: 1000 mL via INTRAVENOUS

## 2017-01-19 MED ORDER — INSULIN ASPART 100 UNIT/ML ~~LOC~~ SOLN
0.0000 [IU] | Freq: Three times a day (TID) | SUBCUTANEOUS | Status: DC
  Administered 2017-01-20 (×3): 2 [IU] via SUBCUTANEOUS
  Administered 2017-01-21: 1 [IU] via SUBCUTANEOUS
  Administered 2017-01-21 – 2017-01-22 (×3): 2 [IU] via SUBCUTANEOUS

## 2017-01-19 MED ORDER — GABAPENTIN 100 MG PO CAPS
100.0000 mg | ORAL_CAPSULE | Freq: Every day | ORAL | Status: DC
Start: 2017-01-19 — End: 2017-01-22
  Administered 2017-01-19 – 2017-01-21 (×3): 100 mg via ORAL
  Filled 2017-01-19 (×3): qty 1

## 2017-01-19 NOTE — ED Notes (Signed)
CRITICAL VALUE ALERT  Critical Value:  Troponin 22.33  Date & Time Notied:  01/19/2017  Provider Notified: Dr. Lita Mains  Orders Received/Actions taken: 01/19/2017, 1540

## 2017-01-19 NOTE — ED Notes (Addendum)
EKG and POC CBG completed in triage.   EKG completed by Roma Kayser, RN and this RN give EKG to Dr. Alvino Chapel.

## 2017-01-19 NOTE — Progress Notes (Signed)
Pt. admitted to 4NP[facility] from [facility]. Oriented to room, call bell, Ascom phones and staff. Bed in low position, fall safety plan reviewed, yellow non-skid socks in place, bed alarm on. Full assessment to Epic; skin assessed with Farrel Conners, RN. Heparin gtt infusing without difficulty. Attempted x2 two get 2nd PIV without success, IV team consulted. Pt denies any pain or discomfort at this time. Will continue to monitor.

## 2017-01-19 NOTE — Progress Notes (Signed)
ANTICOAGULATION CONSULT NOTE - Initial Consult  Pharmacy Consult for heparin Indication: chest pain/ACS  No Known Allergies  Patient Measurements: Height: 6' (182.9 cm) Weight: (!) 301 lb 13 oz (136.9 kg) IBW/kg (Calculated) : 77.6 Heparin Dosing Weight: 109 Kg  Vital Signs: Temp: 97.5 F (36.4 C) (06/19 2100) Temp Source: Oral (06/19 2100) BP: 165/129 (06/19 2100) Pulse Rate: 64 (06/19 2100)  Labs:  Recent Labs  01/19/17 1359 01/19/17 1406 01/19/17 1721  HGB 15.3  --   --   HCT 44.3  --   --   PLT 138*  --   --   CREATININE 2.16*  --   --   TROPONINI  --  22.33* 26.31*    Estimated Creatinine Clearance: 46.2 mL/min (A) (by C-G formula based on SCr of 2.16 mg/dL (H)).   Medical History: Past Medical History:  Diagnosis Date  . Cancer (HCC)    low grade non hodgkin's lymphoma  . Chronic kidney disease    stage III kidney disease   . Diabetes mellitus   . Hypothyroidism   . Lymphoma ([place])   . Peripheral vascular disease (HCC)    neuropathy in toes   . Pneumonia    hx of   . S/P biopsy   . Shortness of breath    due ot pain in knees   . Sleep apnea    sleep study 3/29 13 ? location     Medications:  Prescriptions Prior to Admission  Medication Sig Dispense Refill Last Dose  . aspirin 81 MG tablet Take 81 mg by mouth daily.   01/19/2017 at Unknown time  . fluticasone (FLONASE) 50 MCG/ACT nasal spray Place 1 spray into both nostrils daily.   01/19/2017 at Unknown time  . gabapentin (NEURONTIN) 100 MG capsule Take 100 mg by mouth at bedtime.   01/18/2017 at Unknown time  . insulin aspart (NOVOLOG FLEXPEN) 100 UNIT/ML FlexPen Inject into the skin. Sliding scale As needed for blood sugar   01/18/2017 at Unknown time  . insulin degludec (TRESIBA FLEXTOUCH) 100 UNIT/ML SOPN FlexTouch Pen Inject 0.5 mLs (50 Units total) into the skin daily at 10 pm. (Patient taking differently: Inject 60 Units into the skin daily at 10 pm. )   01/18/2017 at Unknown time  .  levothyroxine (SYNTHROID, LEVOTHROID) 100 MCG tablet Take 100 mcg by mouth daily before breakfast.   01/19/2017 at Unknown time  . levothyroxine (SYNTHROID, LEVOTHROID) 200 MCG tablet Take 200 mcg by mouth daily before breakfast.   01/19/2017 at Unknown time  . losartan (COZAAR) 25 MG tablet Take 25 mg by mouth every morning.   01/19/2017 at Unknown time  . montelukast (SINGULAIR) 10 MG tablet Take 10 mg by mouth.   01/19/2017 at Unknown time  . omega-3 acid ethyl esters (LOVAZA) 1 g capsule Take 1 capsule by mouth daily.    01/19/2017 at Unknown time  . simvastatin (ZOCOR) 5 MG tablet Take 5 mg by mouth daily.   01/19/2017 at Unknown time  . traZODone (DESYREL) 50 MG tablet TAKE 1 TO 2 TABLETS AT BEDTIME FOR INSOMNIA   01/19/2017 at Unknown time  . verapamil (CALAN-SR) 240 MG CR tablet Take 240 mg by mouth daily.   01/19/2017 at Unknown time   Assessment: [name] [name] is a 71 y.o. male  transfered from [facility] with chest pain and elevated troponin. Patient was initiated on heparin at approximately 1700 this evening. Baseline HgB 15.3, PLT 138. Previous rate of 1000 units/hr was continued during  the transfer.   Goal of Therapy:  Heparin level 0.3-0.7 units/ml Monitor platelets by anticoagulation protocol: Yes   Plan:  Continue heparin infusion at 1000 units/hr Check anti-Xa level at 2300 and daily while on heparin Continue to monitor H&H and platelets  Georga Bora, PharmD Clinical Pharmacist 01/19/2017 9:41 PM

## 2017-01-19 NOTE — ED Triage Notes (Signed)
Pt girlfriend reports pt complained of emesis,loss of appetite, and weakness since last night. nad noted.

## 2017-01-19 NOTE — H&P (Signed)
Patient Demographics:    [name] [name], is a 71 y.o. 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 Patient is currently chest pain-free. ED provider d/w Dr Kirk Ruths (cardiology at [facility]). Patient has been started on IV heparin drip in the ED at [facility], aspirin, Lipitor, Plavix and metoprolol ordered. Will most likely need nephrology input prior to left heart catheterization due to CKD stage IV. Check echocardiogram to evaluate EF and wall motion abnormalities. Serial troponins pending  2)CKD IV- please get nephrology consult (Dr. Madelon Lips) prior to [facility]. Renal function appears to be at baseline at this time, defer to Nephrologist if losartan should be held pending LHC, continue to avoid nephrotoxic agents and maintain adequate hydration  3)DM- no recent A1c available, patient was on Tresiba 50 units daily at home, give Lantus 40 units daily at bedtime and low-dose sliding scale Humalog insulin patient will be nothing by mouth after midnight for possible LHC in am , so avoid aggressive glycemic control at this time.  Allow some permissive Hyperglycemia rather than risk life-threatening hypoglycemia in a patient with unreliable oral intake. Use Novolog/Humalog Sliding scale insulin with Accu-Cheks/Fingersticks as ordered  4)HTN- stable, stop Verapamil, use low-dose metoprolol with parameters,  5)H/o Non-Hodgkin's lymphoma, completed radiation treatments in 2017, last chemotherapy treatment was about a month ago according to patient  6)Morbid Obesity/OSA- CPAP daily at bedtime and when necessary  7)Nicotine Abuse- cessation strongly advised  With History of - Reviewed by me  Past Medical History:  Diagnosis Date  . Cancer (HCC)    low grade non hodgkin's lymphoma  . Chronic kidney disease    stage III kidney disease   . Diabetes mellitus   . Hypothyroidism   . Lymphoma ([place])   . Peripheral vascular disease (HCC)    neuropathy in toes   . Pneumonia    hx of   . S/P biopsy   . Shortness of breath    due ot pain in knees   . Sleep apnea    sleep study 3/29 13 ? location       Past Surgical History:  Procedure Laterality Date  . CHOLECYSTECTOMY  1992  . COLONOSCOPY N/A  09/03/2014   SLF:six colon polyps removed/small internal hemorrhoids  . ESOPHAGOGASTRODUODENOSCOPY N/A 09/03/2014   SLF: mild gastritis/few gastric polyps  . TOTAL KNEE ARTHROPLASTY  11/10/2011   Procedure: TOTAL KNEE ARTHROPLASTY;  Surgeon: Mauri Pole, MD;  Location: [facility] ORS;  Service: Orthopedics;  Laterality: Right;      Chief Complaint  Patient presents with  . Weakness      HPI:    [name] [name]  is a 71 y.o. male, With past medical history relevant for hypertension, morbid obesity, diabetes, nicotine abuse and dyslipidemia who presents with vague complaints of fatigue, nausea and generalized weakness 24 hours. Patient apparently had some right-sided chest discomfort on 01/18/2017 but this has resolved. He also had nausea with emesis over the last 24 hours, emesis was without blood or bile. No leg pains no leg  swelling no pleuritic symptoms. No fevers no chills no productive cough. No dizziness, no increased shortness of breath or increased dyspnea on exertion. No headache or visual disturbance.  According to patient's significant other at bedside, patient had just not felt well for about 24 hours now  In ED workup reveals EKG with an old left bundle branch block without new acute findings, however patient's troponin is over 22. Again patient is chest pain-free at this time.   IV heparin drip was started in the ED, aspirin and metoprolol Lipitor and Plavix were ordered .  he'll be transferred to [facility] Cardiac stepdown unit after discussions with Dr. Kirk Ruths the on-call cardiologist    Review of systems:    In addition to the HPI above,   A full 12 point Review of 10 Systems was done, except as stated above, all other Review of 10 Systems were negative.    Social History:  Reviewed by me    Social History  Substance Use Topics  . Smoking status: Former Smoker    Packs/day: 1.00    Years: 30.00    Types: Cigarettes    Quit date: 08/04/2007  . Smokeless tobacco: Never Used  . Alcohol use No       Family History :  Reviewed by me    Family History  Problem Relation Age of Onset  . Cancer Mother        breast and lung  . Cancer Father        bladder  . Cancer Maternal Uncle        prostate  . Cancer Paternal Uncle        esophagus  . Colon cancer Neg Hx     Home Medications:   Prior to Admission medications   Medication Sig Start Date End Date Taking? Authorizing Provider  aspirin 81 MG tablet Take 81 mg by mouth daily.   Yes [provider]  fluticasone (FLONASE) 50 MCG/ACT nasal spray Place 1 spray into both nostrils daily.   Yes [provider]  gabapentin (NEURONTIN) 100 MG capsule Take 100 mg by mouth at bedtime.   Yes [provider]  insulin aspart (NOVOLOG FLEXPEN) 100 UNIT/ML FlexPen Inject into the skin. Sliding scale As  needed for blood sugar   Yes [provider]  insulin degludec (TRESIBA FLEXTOUCH) 100 UNIT/ML SOPN FlexTouch Pen Inject 0.5 mLs (50 Units total) into the skin daily at 10 pm. Patient taking differently: Inject 60 Units into the skin daily at 10 pm.  07/28/16  Yes Kathie Dike, MD  levothyroxine (SYNTHROID, LEVOTHROID) 100 MCG tablet Take 100 mcg by mouth daily before breakfast.  Yes [provider]  levothyroxine (SYNTHROID, LEVOTHROID) 200 MCG tablet Take 200 mcg by mouth daily before breakfast.   Yes [provider]  losartan (COZAAR) 25 MG tablet Take 25 mg by mouth every morning. 12/01/16  Yes [provider]  montelukast (SINGULAIR) 10 MG tablet Take 10 mg by mouth. 08/07/16  Yes [provider]  omega-3 acid ethyl esters (LOVAZA) 1 g capsule Take 1 capsule by mouth daily.  11/26/16  Yes [provider]  simvastatin (ZOCOR) 5 MG tablet Take 5 mg by mouth daily. 09/14/16  Yes [provider]  traZODone (DESYREL) 50 MG tablet TAKE 1 TO 2 TABLETS AT BEDTIME FOR INSOMNIA 09/30/16  Yes [provider]  verapamil (CALAN-SR) 240 MG CR tablet Take 240 mg by mouth daily.   Yes [provider]     Allergies:    No Known Allergies   Physical Exam:   Vitals  Blood pressure 132/77, pulse 60, temperature 97.6 F (36.4 C), temperature source Oral, resp. rate 19, height 6' (1.829 m), weight 136.1 kg (300 lb), SpO2 94 %.  Physical Examination: General appearance - alert, Morbidly obese appearing, and in no distress  Mental status - alert, oriented to person, place, and time,  Eyes - sclera anicteric Neck - supple, no JVD elevation , Chest - clear  to auscultation bilaterally, symmetrical air movement,  Heart - S1 and S2 normal,  Abdomen - soft, nontender, nondistended, increased truncal adiposity  Neurological - screening mental status exam normal, neck supple without rigidity, cranial nerves II through XII intact,  DTR's normal and symmetric Extremities -   intact peripheral pulses  Skin - Venous stasis type discoloration of the lower extremities without significant edema.     Data Review:    CBC  Recent Labs Lab 01/19/17 1359  WBC 11.6*  HGB 15.3  HCT 44.3  PLT 138*  MCV 86.2  MCH 29.8  MCHC 34.5  RDW 15.7*   -----------------------------------------------------------------------------------------------------------------  Chemistries   Recent Labs Lab 01/19/17 1359 01/19/17 1406  NA 135  --   K 3.8  --   CL 101  --   CO2 22  --   GLUCOSE 231*  --   BUN 27*  --   CREATININE 2.16*  --   CALCIUM 9.3  --   AST  --  109*  ALT  --  30  ALKPHOS  --  66  BILITOT  --  1.5*   ------------------------------------------------------------------------------------------------------------------ estimated creatinine clearance is 46.1 mL/min (A) (by C-G formula based on SCr of 2.16 mg/dL (H)). ------------------------------------------------------------------------------------------------------------------ No results for input(s): TSH, T4TOTAL, T3FREE, THYROIDAB in the last 72 hours.  Invalid input(s): FREET3   Coagulation profile No results for input(s): INR, PROTIME in the last 168 hours. ------------------------------------------------------------------------------------------------------------------- No results for input(s): DDIMER in the last 72 hours. -----------------------------------------------------------------------------------------------------------------  Cardiac Enzymes  Recent Labs Lab 01/19/17 1406  TROPONINI 22.33*   ------------------------------------------------------------------------------------------------------------------ No results found for: BNP  --------------------------------------------------------------------------------------------------------------  Urinalysis    Component Value Date/Time   COLORURINE YELLOW 01/19/2017 1530    APPEARANCEUR HAZY (A) 01/19/2017 1530   LABSPEC 1.021 01/19/2017 1530   PHURINE 5.0 01/19/2017 1530   GLUCOSEU 150 (A) 01/19/2017 1530   HGBUR SMALL (A) 01/19/2017 1530   BILIRUBINUR NEGATIVE 01/19/2017 1530   KETONESUR NEGATIVE 01/19/2017 1530   PROTEINUR 100 (A) 01/19/2017 1530   UROBILINOGEN 4.0 (H) 07/23/2014 1356   NITRITE NEGATIVE 01/19/2017 1530   LEUKOCYTESUR NEGATIVE 01/19/2017 1530    ----------------------------------------------------------------------------------------------------------------  Imaging Results:    Dg Abd Acute W/chest  Result Date: 01/19/2017 CLINICAL DATA:  71 y/o  M; chest pain and vomiting. EXAM: DG ABDOMEN ACUTE W/ 1V CHEST COMPARISON:  07/24/2016 chest radiograph.  08/14/2016 PET-CT. FINDINGS: There is no evidence of dilated bowel loops or free intraperitoneal air. Splenomegaly. Cholecystectomy clips. No focal consolidation, effusion, or pneumothorax of lungs. Stable cardiac silhouette. Multilevel degenerative changes of the spine. IMPRESSION: 1. No acute cardiopulmonary process identified. 2. Normal bowel gas pattern. 3. Splenomegaly. Electronically Signed   By: Kristine Garbe M.D.   On: 01/19/2017 15:16    Radiological Exams on Admission: Dg Abd Acute W/chest  Result Date: 01/19/2017 CLINICAL DATA:  72 y/o  M; chest pain and vomiting. EXAM: DG ABDOMEN ACUTE W/ 1V CHEST COMPARISON:  07/24/2016 chest radiograph.  08/14/2016 PET-CT. FINDINGS: There is no evidence of dilated bowel loops or free intraperitoneal air. Splenomegaly. Cholecystectomy clips. No focal consolidation, effusion, or pneumothorax of lungs. Stable cardiac silhouette. Multilevel degenerative changes of the spine. IMPRESSION: 1. No acute cardiopulmonary process identified. 2. Normal bowel gas pattern. 3. Splenomegaly. Electronically Signed   By: Kristine Garbe M.D.   On: 01/19/2017 15:16    DVT Prophylaxis - iv Heparin AM Labs Ordered, also please review Full  Orders  Family Communication: Admission, patients condition and plan of care including tests being ordered have been discussed with the patient and s/o who indicate understanding and agree with the plan   Code Status - Full Code  Likely DC to  home  Condition   stable  [name] [name] M.D on 01/19/2017 at 5:44 PM   Between 7am to 7pm - Pager - [phone]  After 7pm go to [email or web address] - password [email or web address]  [facility] - Office  [phone]  Voice Recognition Viviann Spare dictation system was used to create this note, attempts have been made to correct errors. Please contact the author with questions and/or clarifications.

## 2017-01-19 NOTE — ED Provider Notes (Signed)
[facility] DEPT Provider Note   CSN: [id number] Arrival date & time: 01/19/17  1306     History   Chief Complaint Chief Complaint  Patient presents with  . Weakness    HPI [name] [name] is a 71 y.o. male.  HPI Patient presents with generalized weakness starting yesterday. Has had decreased appetite and nausea. Try to eat today but then vomited. Denies any abdominal pain. Has had episodic constipation. Patient also states he had "Funny feeling" right-sided chest discomfort all day yesterday that has now completely resolved. Denies any fever, shortness of breath or cough. No dysuria, hematuria, frequency or urgency. Past Medical History:  Diagnosis Date  . Cancer (HCC)    low grade non hodgkin's lymphoma  . Chronic kidney disease    stage III kidney disease   . Diabetes mellitus   . Hypothyroidism   . Lymphoma ([place])   . Peripheral vascular disease (HCC)    neuropathy in toes   . Pneumonia    hx of   . S/P biopsy   . Shortness of breath    due ot pain in knees   . Sleep apnea    sleep study 3/29 13 ? location     Patient Active Problem List   Diagnosis Date Noted  . NSTEMI (non-ST elevated myocardial infarction) ([place]) 01/19/2017  . NSTEMI, initial episode of care ([place]) 01/19/2017  . Sepsis ([place]) 07/24/2016  . Elevated troponin I level 07/24/2016  . Fever 07/24/2016  . Lactic acidosis 07/24/2016  . Adjustment insomnia 05/18/2016  . Erectile dysfunction 12/19/2015  . Sleep apnea 09/30/2015  . Osteoarthritis 09/30/2015  . Hypothyroidism 09/30/2015  . Hypogonadism in male 09/30/2015  . Diabetic neuropathy ([place]) 09/30/2015  . Chronic pain of left knee 09/30/2015  . Benign essential hypertension 09/30/2015  . Acute on chronic kidney failure ([place]) 05/14/2015  . Diarrhea 05/14/2015  . Dehydration 05/14/2015  . Hypokalemia 05/14/2015  . 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 Pelvic mass in male   . Varices, esophageal ([place])   . Colonic mass   . Abnormal CT  scan, pelvis   . Bladder mass   . Elevated liver enzymes   . Elevated LFTs   . Abdominal pain 07/23/2014  . Chronic kidney disease Stage IV 07/23/2014  . Morbid obesity ([place]) 07/23/2014  . Type 2 diabetes mellitus with stage 4 chronic kidney disease ([place]) 07/23/2014  . Hypertension 07/23/2014  . S/P right TKA 11/11/2011    Past Surgical History:  Procedure Laterality Date  . CHOLECYSTECTOMY  1992  . COLONOSCOPY N/A 09/03/2014   SLF:six colon polyps removed/small internal hemorrhoids  . ESOPHAGOGASTRODUODENOSCOPY N/A 09/03/2014   SLF: mild gastritis/few gastric polyps  . TOTAL KNEE ARTHROPLASTY  11/10/2011   Procedure: TOTAL KNEE ARTHROPLASTY;  Surgeon: Mauri Pole, MD;  Location: [facility];  Service: Orthopedics;  Laterality: Right;       Home Medications    Prior to Admission medications   Medication Sig Start Date End Date Taking? Authorizing Provider  aspirin 81 MG tablet Take 81 mg by mouth daily.   Yes [provider]  fluticasone (FLONASE) 50 MCG/ACT nasal spray Place 1 spray into both nostrils daily.   Yes [provider]  gabapentin (NEURONTIN) 100 MG capsule Take 100 mg by mouth at bedtime.   Yes [provider]  insulin aspart (NOVOLOG FLEXPEN) 100 UNIT/ML FlexPen Inject into the skin. Sliding scale As needed for blood sugar   Yes [provider]  insulin degludec (TRESIBA FLEXTOUCH)  100 UNIT/ML SOPN FlexTouch Pen Inject 0.5 mLs (50 Units total) into the skin daily at 10 pm. Patient taking differently: Inject 60 Units into the skin daily at 10 pm.  07/28/16  Yes Memon, Jolaine Artist, MD  levothyroxine (SYNTHROID, LEVOTHROID) 100 MCG tablet Take 100 mcg by mouth daily before breakfast.   Yes [provider]  levothyroxine (SYNTHROID, LEVOTHROID) 200 MCG tablet Take 200 mcg by mouth daily before breakfast.   Yes [provider]  losartan (COZAAR) 25 MG tablet Take 25 mg by mouth every morning. 12/01/16  Yes [provider]  montelukast (SINGULAIR) 10 MG tablet Take 10 mg by mouth. 08/07/16  Yes [provider]  omega-3 acid ethyl esters (LOVAZA) 1 g capsule Take 1 capsule by mouth daily.  11/26/16  Yes [provider]  simvastatin (ZOCOR) 5 MG tablet Take 5 mg by mouth daily. 09/14/16  Yes [provider]  traZODone (DESYREL) 50 MG tablet TAKE 1 TO 2 TABLETS AT BEDTIME FOR INSOMNIA 09/30/16  Yes [provider]  verapamil (CALAN-SR) 240 MG CR tablet Take 240 mg by mouth daily.   Yes [provider]    Family History Family History  Problem Relation Age of Onset  . Cancer Mother        breast and lung  . Cancer Father        bladder  . Cancer Maternal Uncle        prostate  . Cancer Paternal Uncle        esophagus  . Colon cancer Neg Hx     Social History Social History  Substance Use Topics  . Smoking status: Former Smoker    Packs/day: 1.00    Years: 30.00    Types: Cigarettes    Quit date: 08/04/2007  . Smokeless tobacco: Never Used  . Alcohol use No     Allergies   Patient has no known allergies.   Review of Systems Review of Systems  Constitutional: Positive for activity change, appetite change and fatigue. Negative for chills and fever.  HENT: Negative for congestion, sinus pain and sore throat.   Eyes: Positive for photophobia. Negative for visual disturbance.  Respiratory: Negative for cough, shortness of breath and wheezing.   Cardiovascular: Positive for chest pain. Negative for palpitations and leg swelling.  Gastrointestinal: Positive for constipation, nausea and vomiting. Negative for abdominal pain and blood in stool.  Genitourinary: Negative for dysuria, flank pain, frequency and hematuria.  Musculoskeletal: Negative for back pain, myalgias, neck pain and neck stiffness.  Skin: Negative for rash and wound.  Neurological: Positive for weakness (generalized). Negative for dizziness, light-headedness, numbness and headaches.  All  other systems reviewed and are negative.    Physical Exam Updated Vital Signs BP 136/73   Pulse (!) 56   Temp 97.6 F (36.4 C) (Oral)   Resp 17   Ht 6' (1.829 m)   Wt 136.1 kg (300 lb)   SpO2 95%   BMI 40.69 kg/m   Physical Exam  Constitutional: He is oriented to person, place, and time. He appears well-developed and well-nourished. No distress.  HENT:  Head: Normocephalic and atraumatic.  Mouth/Throat: No oropharyngeal exudate.  Dry mucous membranes  Eyes: EOM are normal. Pupils are equal, round, and reactive to light.  Pinpoint pupils bilaterally.  Neck: Normal range of motion. Neck supple.  Cardiovascular: Normal rate and regular rhythm.   Pulmonary/Chest: Effort normal and breath sounds normal. No respiratory distress. He has no wheezes. He has no  rales. He exhibits no tenderness.  Abdominal: Soft. Bowel sounds are normal. There is no tenderness. There is no rebound and no guarding.  Musculoskeletal: Normal range of motion. He exhibits no edema or tenderness.  No lower extremity swelling, asymmetry or tenderness.  Lymphadenopathy:    He has no cervical adenopathy.  Neurological: He is alert and oriented to person, place, and time.  5/5 motor in all extremities. Sensation fully intact.  Skin: Skin is warm and dry. Capillary refill takes less than 2 seconds. No rash noted. No erythema.  Psychiatric: He has a normal mood and affect. His behavior is normal.  Nursing note and vitals reviewed.    ED Treatments / Results  Labs (all labs ordered are listed, but only abnormal results are displayed) Labs Reviewed  BASIC METABOLIC PANEL - Abnormal; Notable for the following:       Result Value   Glucose, Bld 231 (*)    BUN 27 (*)    Creatinine, Ser 2.16 (*)    GFR calc non Af Amer 29 (*)    GFR calc Af Amer 34 (*)    All other components within normal limits  CBC - Abnormal; Notable for the following:    WBC 11.6 (*)    RDW 15.7 (*)    Platelets 138 (*)    All other  components within normal limits  URINALYSIS, ROUTINE W REFLEX MICROSCOPIC - Abnormal; Notable for the following:    APPearance HAZY (*)    Glucose, UA 150 (*)    Hgb urine dipstick SMALL (*)    Protein, ur 100 (*)    Bacteria, UA RARE (*)    Squamous Epithelial / LPF 0-5 (*)    All other components within normal limits  HEPATIC FUNCTION PANEL - Abnormal; Notable for the following:    AST 109 (*)    Total Bilirubin 1.5 (*)    Indirect Bilirubin 1.2 (*)    All other components within normal limits  TROPONIN I - Abnormal; Notable for the following:    Troponin I 22.33 (*)    All other components within normal limits  TROPONIN I - Abnormal; Notable for the following:    Troponin I 26.31 (*)    All other components within normal limits  CBG MONITORING, ED - Abnormal; Notable for the following:    Glucose-Capillary 227 (*)    All other components within normal limits  LIPASE, BLOOD  TSH  TROPONIN I  COMPREHENSIVE METABOLIC PANEL  CBC  TROPONIN I  I-STAT CG4 LACTIC ACID, ED    EKG  EKG Interpretation  Date/Time:  Tuesday January 19 2017 13:22:40 EDT Ventricular Rate:  60 PR Interval:  238 QRS Duration: 142 QT Interval:  486 QTC Calculation: 486 R Axis:   -62 Text Interpretation:  Sinus rhythm with 1st degree A-V block Left axis deviation Left bundle branch block Abnormal ECG Confirmed by Lita Mains  MD, Yashvi Jasinski ([id number]) on 01/19/2017 3:44:13 PM       Radiology Dg Abd Acute W/chest  Result Date: 01/19/2017 CLINICAL DATA:  71 y/o  M; chest pain and vomiting. EXAM: DG ABDOMEN ACUTE W/ 1V CHEST COMPARISON:  07/24/2016 chest radiograph.  08/14/2016 PET-CT. FINDINGS: There is no evidence of dilated bowel loops or free intraperitoneal air. Splenomegaly. Cholecystectomy clips. No focal consolidation, effusion, or pneumothorax of lungs. Stable cardiac silhouette. Multilevel degenerative changes of the spine. IMPRESSION: 1. No acute cardiopulmonary process identified. 2. Normal bowel gas  pattern. 3. Splenomegaly. Electronically Signed   By:  Kristine Garbe M.D.   On: 01/19/2017 15:16    Procedures Procedures (including critical care time)  Medications Ordered in ED Medications  heparin ADULT infusion 100 units/mL (25000 units/239mL sodium chloride 0.45%) (1,000 Units/hr Intravenous New Bag/Given 01/19/17 1650)  levothyroxine (SYNTHROID, LEVOTHROID) tablet 200 mcg (not administered)  gabapentin (NEURONTIN) capsule 100 mg (not administered)  fluticasone (FLONASE) 50 MCG/ACT nasal spray 1 spray (not administered)  losartan (COZAAR) tablet 25 mg (not administered)  omega-3 acid ethyl esters (LOVAZA) capsule 1 g (not administered)  sodium chloride flush (NS) 0.9 % injection 3 mL (not administered)  sodium chloride flush (NS) 0.9 % injection 3 mL (not administered)  0.9 %  sodium chloride infusion (not administered)  acetaminophen (TYLENOL) tablet 650 mg (not administered)    Or  acetaminophen (TYLENOL) suppository 650 mg (not administered)  traZODone (DESYREL) tablet 50 mg (not administered)  senna (SENOKOT) tablet 8.6 mg (not administered)  polyethylene glycol (MIRALAX / GLYCOLAX) packet 17 g (not administered)  ondansetron (ZOFRAN) tablet 4 mg (not administered)    Or  ondansetron (ZOFRAN) injection 4 mg (not administered)  albuterol (PROVENTIL) (2.5 MG/3ML) 0.083% nebulizer solution 2.5 mg (not administered)  sodium chloride flush (NS) 0.9 % injection 3 mL (not administered)  aspirin EC tablet 81 mg (not administered)  atorvastatin (LIPITOR) tablet 80 mg (not administered)  atorvastatin (LIPITOR) tablet 40 mg (not administered)  clopidogrel (PLAVIX) tablet 75 mg (not administered)  metoprolol tartrate (LOPRESSOR) tablet 12.5 mg (not administered)  insulin glargine (LANTUS) injection 40 Units (not administered)  insulin aspart (novoLOG) injection 0-9 Units (not administered)  sodium chloride 0.9 % bolus 1,000 mL (0 mLs Intravenous Stopped 01/19/17 1600)    aspirin chewable tablet 324 mg (324 mg Oral Given 01/19/17 1604)  heparin bolus via infusion 4,000 Units (4,000 Units Intravenous Bolus from Bag 01/19/17 1648)   CRITICAL CARE Performed by: Lita Mains, Caydee Talkington Total critical care time:25 minutes Critical care time was exclusive of separately billable procedures and treating other patients. Critical care was necessary to treat or prevent imminent or life-threatening deterioration. Critical care was time spent personally by me on the following activities: development of treatment plan with patient and/or surrogate as well as nursing, discussions with consultants, evaluation of patient's response to treatment, examination of patient, obtaining history from patient or surrogate, ordering and performing treatments and interventions, ordering and review of laboratory studies, ordering and review of radiographic studies, pulse oximetry and re-evaluation of patient's condition.  Initial Impression / Assessment and Plan / ED Course  I have reviewed the triage vital signs and the nursing notes.  Pertinent labs & imaging results that were available during my care of the patient were reviewed by me and considered in my medical decision making (see chart for details).    Patient with previous left bundle branch block. Troponin is elevated at 22. Discussed with Dr. Stanford Breed. Recommends transfer to [facility] and hospitalist admit. Hospice will see in emergency department. We'll start heparin bolus and drip. Given 325 of aspirin. Patient took simvastatin earlier today. Final Clinical Impressions(s) / ED Diagnoses   Final diagnoses:  NSTEMI (non-ST elevated myocardial infarction) [facility])    New Prescriptions New Prescriptions   No medications on file     Julianne Rice, MD 01/19/17 [phone]

## 2017-01-20 ENCOUNTER — Other Ambulatory Visit (HOSPITAL_COMMUNITY): Payer: Medicare HMO

## 2017-01-20 ENCOUNTER — Encounter (HOSPITAL_COMMUNITY)
Admission: EM | Disposition: A | Discharge status: Home Health | Payer: Self-pay | Source: Home / Self Care | Attending: Internal Medicine

## 2017-01-20 ENCOUNTER — Inpatient Hospital Stay (HOSPITAL_COMMUNITY): Payer: Medicare HMO

## 2017-01-20 DIAGNOSIS — I10 Essential (primary) hypertension: Secondary | ICD-10-CM

## 2017-01-20 DIAGNOSIS — I129 Hypertensive chronic kidney disease with stage 1 through stage 4 chronic kidney disease, or unspecified chronic kidney disease: Secondary | ICD-10-CM

## 2017-01-20 DIAGNOSIS — I2510 Atherosclerotic heart disease of native coronary artery without angina pectoris: Secondary | ICD-10-CM

## 2017-01-20 DIAGNOSIS — I351 Nonrheumatic aortic (valve) insufficiency: Secondary | ICD-10-CM

## 2017-01-20 DIAGNOSIS — N184 Chronic kidney disease, stage 4 (severe): Secondary | ICD-10-CM

## 2017-01-20 DIAGNOSIS — Z794 Long term (current) use of insulin: Secondary | ICD-10-CM

## 2017-01-20 DIAGNOSIS — I214 Non-ST elevation (NSTEMI) myocardial infarction: Principal | ICD-10-CM

## 2017-01-20 DIAGNOSIS — R748 Abnormal levels of other serum enzymes: Secondary | ICD-10-CM

## 2017-01-20 DIAGNOSIS — E1122 Type 2 diabetes mellitus with diabetic chronic kidney disease: Secondary | ICD-10-CM

## 2017-01-20 HISTORY — PX: LEFT HEART CATH AND CORONARY ANGIOGRAPHY: CATH118249

## 2017-01-20 LAB — LIPID PANEL
CHOLESTEROL: 116 mg/dL (ref 0–200)
HDL: 31 mg/dL — ABNORMAL LOW (ref >40)
LDL Cholesterol: 50 mg/dL (ref 0–99)
Total CHOL/HDL Ratio: 3.7 RATIO
Triglycerides: 174 mg/dL — ABNORMAL HIGH (ref <150)
VLDL: 35 mg/dL (ref 0–40)

## 2017-01-20 LAB — BASIC METABOLIC PANEL
Anion gap: 8 (ref 5–15)
BUN: 25 mg/dL — ABNORMAL HIGH (ref 6–20)
CALCIUM: 8.9 mg/dL (ref 8.9–10.3)
CO2: 23 mmol/L (ref 22–32)
CREATININE: 2.2 mg/dL — AB (ref 0.61–1.24)
Chloride: 105 mmol/L (ref 101–111)
GFR calc Af Amer: 33 mL/min — ABNORMAL LOW (ref >60)
GFR, EST NON AFRICAN AMERICAN: 29 mL/min — AB (ref >60)
Glucose, Bld: 184 mg/dL — ABNORMAL HIGH (ref 65–99)
POTASSIUM: 3.8 mmol/L (ref 3.5–5.1)
SODIUM: 136 mmol/L (ref 135–145)

## 2017-01-20 LAB — CBC
HCT: 43.2 % (ref 39.0–52.0)
HEMOGLOBIN: 14.7 g/dL (ref 13.0–17.0)
MCH: 29.8 pg (ref 26.0–34.0)
MCHC: 34 g/dL (ref 30.0–36.0)
MCV: 87.4 fL (ref 78.0–100.0)
PLATELETS: 133 10*3/uL — AB (ref 150–400)
RBC: 4.94 MIL/uL (ref 4.22–5.81)
RDW: 16.1 % — ABNORMAL HIGH (ref 11.5–15.5)
WBC: 10.9 10*3/uL — ABNORMAL HIGH (ref 4.0–10.5)

## 2017-01-20 LAB — GLUCOSE, CAPILLARY
GLUCOSE-CAPILLARY: 191 mg/dL — AB (ref 65–99)
GLUCOSE-CAPILLARY: 164 mg/dL — AB (ref 65–99)
Glucose-Capillary: 159 mg/dL — ABNORMAL HIGH (ref 65–99)
Glucose-Capillary: 315 mg/dL — ABNORMAL HIGH (ref 65–99)

## 2017-01-20 LAB — ECHOCARDIOGRAM COMPLETE
HEIGHTINCHES: 72 in
Weight: 4828.96 oz

## 2017-01-20 LAB — PROTIME-INR
INR: 1.12
PROTHROMBIN TIME: 14.5 s (ref 11.4–15.2)

## 2017-01-20 LAB — HEPARIN LEVEL (UNFRACTIONATED)
HEPARIN UNFRACTIONATED: 0.12 [IU]/mL — AB (ref 0.30–0.70)
Heparin Unfractionated: <0.1 IU/mL — ABNORMAL LOW (ref 0.30–0.70)

## 2017-01-20 SURGERY — LEFT HEART CATH AND CORONARY ANGIOGRAPHY
Anesthesia: LOCAL

## 2017-01-20 MED ORDER — HEPARIN (PORCINE) IN NACL 2-0.9 UNIT/ML-% IJ SOLN
INTRAMUSCULAR | Status: AC | PRN
  Administered 2017-01-20: 1000 mL

## 2017-01-20 MED ORDER — HEPARIN (PORCINE) IN NACL 100-0.45 UNIT/ML-% IJ SOLN
2100.0000 [IU]/h | INTRAMUSCULAR | Status: DC
  Administered 2017-01-20: 1750 [IU]/h via INTRAVENOUS
  Filled 2017-01-20: qty 250

## 2017-01-20 MED ORDER — SODIUM CHLORIDE 0.9% FLUSH: 3.0000 mL | Freq: Two times a day (BID) | INTRAVENOUS | Status: DC

## 2017-01-20 MED ORDER — HEPARIN SODIUM (PORCINE) 1000 UNIT/ML IJ SOLN
INTRAMUSCULAR | Status: DC | PRN
  Administered 2017-01-20: 6000 [IU] via INTRAVENOUS

## 2017-01-20 MED ORDER — FENTANYL CITRATE (PF) 100 MCG/2ML IJ SOLN
INTRAMUSCULAR | Status: DC | PRN
  Administered 2017-01-20: 25 ug via INTRAVENOUS

## 2017-01-20 MED ORDER — FENTANYL CITRATE (PF) 100 MCG/2ML IJ SOLN
INTRAMUSCULAR | Status: AC
  Filled 2017-01-20: qty 2

## 2017-01-20 MED ORDER — PERFLUTREN LIPID MICROSPHERE
1.0000 mL | INTRAVENOUS | Status: DC | PRN
  Administered 2017-01-20: 4 mL via INTRAVENOUS
  Filled 2017-01-20: qty 10

## 2017-01-20 MED ORDER — IOPAMIDOL (ISOVUE-370) INJECTION 76%
INTRAVENOUS | Status: DC | PRN
  Administered 2017-01-20: 60 mL via INTRA_ARTERIAL

## 2017-01-20 MED ORDER — IOPAMIDOL (ISOVUE-370) INJECTION 76%
INTRAVENOUS | Status: AC
  Filled 2017-01-20: qty 100

## 2017-01-20 MED ORDER — PREDNISONE 50 MG PO TABS
50.0000 mg | ORAL_TABLET | Freq: Once | ORAL | Status: AC
  Administered 2017-01-20: 50 mg via ORAL
  Filled 2017-01-20: qty 1

## 2017-01-20 MED ORDER — VERAPAMIL HCL 2.5 MG/ML IV SOLN
INTRAVENOUS | Status: AC
  Filled 2017-01-20: qty 2

## 2017-01-20 MED ORDER — LIDOCAINE HCL (PF) 1 % IJ SOLN
INTRAMUSCULAR | Status: DC | PRN
  Administered 2017-01-20: 2 mL via INTRADERMAL

## 2017-01-20 MED ORDER — LEVOTHYROXINE SODIUM 100 MCG PO TABS
300.0000 ug | ORAL_TABLET | Freq: Every day | ORAL | Status: DC
  Administered 2017-01-21 – 2017-01-22 (×2): 300 ug via ORAL
  Filled 2017-01-20 (×2): qty 3

## 2017-01-20 MED ORDER — SODIUM CHLORIDE 0.9 % IV SOLN
INTRAVENOUS | Status: AC
  Administered 2017-01-20 (×2): via INTRAVENOUS

## 2017-01-20 MED ORDER — SODIUM CHLORIDE 0.9 % IV SOLN: 250.0000 mL | INTRAVENOUS | Status: DC | PRN

## 2017-01-20 MED ORDER — HEPARIN BOLUS VIA INFUSION
3000.0000 [IU] | Freq: Once | INTRAVENOUS | Status: AC
  Administered 2017-01-20: 3000 [IU] via INTRAVENOUS
  Filled 2017-01-20: qty 3000

## 2017-01-20 MED ORDER — HYDRALAZINE HCL 20 MG/ML IJ SOLN
INTRAMUSCULAR | Status: DC | PRN
  Administered 2017-01-20: 20 mg via INTRAVENOUS

## 2017-01-20 MED ORDER — ASPIRIN 81 MG PO CHEW: 81.0000 mg | CHEWABLE_TABLET | Freq: Every day | ORAL | Status: DC

## 2017-01-20 MED ORDER — HEPARIN (PORCINE) IN NACL 2-0.9 UNIT/ML-% IJ SOLN
INTRAMUSCULAR | Status: AC
  Filled 2017-01-20: qty 1000

## 2017-01-20 MED ORDER — VERAPAMIL HCL 2.5 MG/ML IV SOLN
INTRAVENOUS | Status: DC | PRN
  Administered 2017-01-20: 10 mL via INTRA_ARTERIAL

## 2017-01-20 MED ORDER — HEPARIN SODIUM (PORCINE) 1000 UNIT/ML IJ SOLN
INTRAMUSCULAR | Status: AC
  Filled 2017-01-20: qty 1

## 2017-01-20 MED ORDER — ACETAMINOPHEN 325 MG PO TABS: 650.0000 mg | ORAL_TABLET | ORAL | Status: DC | PRN

## 2017-01-20 MED ORDER — SODIUM CHLORIDE 0.9 % IV SOLN: INTRAVENOUS | Status: AC

## 2017-01-20 MED ORDER — ONDANSETRON HCL 4 MG/2ML IJ SOLN: 4.0000 mg | Freq: Four times a day (QID) | INTRAMUSCULAR | Status: DC | PRN

## 2017-01-20 MED ORDER — CLOPIDOGREL BISULFATE 300 MG PO TABS
300.0000 mg | ORAL_TABLET | Freq: Once | ORAL | Status: AC
  Administered 2017-01-20: 300 mg via ORAL
  Filled 2017-01-20: qty 1

## 2017-01-20 MED ORDER — HEPARIN (PORCINE) IN NACL 100-0.45 UNIT/ML-% IJ SOLN: 1750.0000 [IU]/h | INTRAMUSCULAR | Status: DC

## 2017-01-20 MED ORDER — LIDOCAINE HCL (PF) 1 % IJ SOLN
INTRAMUSCULAR | Status: AC
  Filled 2017-01-20: qty 30

## 2017-01-20 MED ORDER — HYDRALAZINE HCL 20 MG/ML IJ SOLN
INTRAMUSCULAR | Status: AC
  Filled 2017-01-20: qty 1

## 2017-01-20 MED ORDER — DIPHENHYDRAMINE HCL 50 MG/ML IJ SOLN
50.0000 mg | Freq: Once | INTRAMUSCULAR | Status: AC
  Administered 2017-01-20: 50 mg via INTRAVENOUS
  Filled 2017-01-20: qty 1

## 2017-01-20 MED ORDER — SODIUM CHLORIDE 0.9% FLUSH: 3.0000 mL | INTRAVENOUS | Status: DC | PRN

## 2017-01-20 MED ORDER — HEPARIN BOLUS VIA INFUSION
3000.0000 [IU] | Freq: Once | INTRAVENOUS | Status: AC
Start: 2017-01-20 — End: 2017-01-20
  Administered 2017-01-20: 3000 [IU] via INTRAVENOUS
  Filled 2017-01-20: qty 3000

## 2017-01-20 SURGICAL SUPPLY — 11 items
CATH EXPO 5F FL3.5 (CATHETERS) ×2 IMPLANT
CATH INFINITI JR4 5F (CATHETERS) ×2 IMPLANT
DEVICE RAD COMP TR BAND LRG (VASCULAR PRODUCTS) ×2 IMPLANT
GLIDESHEATH SLEND SS 6F .021 (SHEATH) ×2 IMPLANT
GUIDEWIRE INQWIRE 1.5J.035X260 (WIRE) ×1 IMPLANT
INQWIRE 1.5J .035X260CM (WIRE) ×2
KIT HEART LEFT (KITS) ×2 IMPLANT
PACK CARDIAC CATHETERIZATION (CUSTOM PROCEDURE TRAY) ×2 IMPLANT
TRANSDUCER W/STOPCOCK (MISCELLANEOUS) ×2 IMPLANT
TUBING CIL FLEX 10 FLL-RA (TUBING) ×2 IMPLANT
WIRE HI TORQ VERSACORE-J 145CM (WIRE) ×2 IMPLANT

## 2017-01-20 NOTE — H&P (View-Only) (Signed)
Cardiology Consult    Patient ID: [name] [name] MRN: [id number], DOB/AGE: 08-21-1945   Admit date: 01/19/2017 Date of Consult: 01/20/2017  Primary Physician: [name] Cardiologist: new - Dr. Irish Lack Requesting Provider: Dr. Clementeen Graham  Reason for Consult: chest pain  Patient Profile    [name] [name] has a PMH significant for DM, CKD stage IV, non-hodgkin's lymphoma (s/p radiation in 2017 and chemotherapy finished in 2018), PVD, obesity, HTN, tobacco use, and hypothyroidism. He presented to [facility] ED with chest pain. Troponin was elevated and he was transferred to [facility] for further ischemic evaluation.  [name] [name] is a 71 y.o. male who is being seen today for the evaluation of chest pain and elevated troponin at the request of Dr. Clementeen Graham.   Past Medical History   Past Medical History:  Diagnosis Date  . Cancer (HCC)    low grade non hodgkin's lymphoma  . Chronic kidney disease    stage III kidney disease   . Diabetes mellitus   . Hypothyroidism   . Lymphoma ([place])   . Peripheral vascular disease (HCC)    neuropathy in toes   . Pneumonia    hx of   . S/P biopsy   . Shortness of breath    due ot pain in knees   . Sleep apnea    sleep study 3/29 13 ? location     Past Surgical History:  Procedure Laterality Date  . CHOLECYSTECTOMY  1992  . COLONOSCOPY N/A 09/03/2014   SLF:six colon polyps removed/small internal hemorrhoids  . ESOPHAGOGASTRODUODENOSCOPY N/A 09/03/2014   SLF: mild gastritis/few gastric polyps  . TOTAL KNEE ARTHROPLASTY  11/10/2011   Procedure: TOTAL KNEE ARTHROPLASTY;  Surgeon: Mauri Pole, MD;  Location: [facility];  Service: Orthopedics;  Laterality: Right;     Allergies  No Known Allergies  History of Present Illness    [name] [name] does not currently follow with cardiology. Yesterday, he woke up with an overall feeling of "unwell." He denies chest pain, but felt "not right" in his right chest. He became nauseated and  vomited once and then felt a little better. No other associated symptoms. He continues to feel weak and presented to [facility] ED for further evaluation. There, his troponin was greater than 20 in the setting of CKD stage IV. He was then transferred to [facility]. On my interview, he denies chest pain, SOB, palpitations, dizziness, and feelings of syncope. However, he reports continued weakness.  He has several risk factors for ACS including DM, obesity, HTN, and previous tobacco use. EKG with old LBBB.  Inpatient Medications    . aspirin EC  81 mg Oral Daily  . atorvastatin  40 mg Oral q1800  . atorvastatin  80 mg Oral Once  . clopidogrel  75 mg Oral Once  . fluticasone  1 spray Each Nare Daily  . gabapentin  100 mg Oral QHS  . insulin aspart  0-9 Units Subcutaneous TID WC  . insulin glargine  40 Units Subcutaneous QHS  . levothyroxine  200 mcg Oral QAC breakfast  . metoprolol tartrate  12.5 mg Oral BID  . omega-3 acid ethyl esters  1 capsule Oral Daily  . pneumococcal 23 valent vaccine  0.5 mL Intramuscular Tomorrow-1000  . senna  1 tablet Oral BID  . sodium chloride flush  3 mL Intravenous Q12H  . sodium chloride flush  3 mL Intravenous Q12H     Outpatient Medications    Prior to Admission medications  Medication Sig Start Date End Date Taking? Authorizing Provider  aspirin 81 MG tablet Take 81 mg by mouth daily.   Yes [provider]  fluticasone (FLONASE) 50 MCG/ACT nasal spray Place 1 spray into both nostrils daily.   Yes [provider]  gabapentin (NEURONTIN) 100 MG capsule Take 100 mg by mouth at bedtime.   Yes [provider]  insulin aspart (NOVOLOG FLEXPEN) 100 UNIT/ML FlexPen Inject into the skin. Sliding scale As needed for blood sugar   Yes [provider]  insulin degludec (TRESIBA FLEXTOUCH) 100 UNIT/ML SOPN FlexTouch Pen Inject 0.5 mLs (50 Units total) into the skin daily at 10 pm. Patient taking differently: Inject 60 Units into the  skin daily at 10 pm.  07/28/16  Yes Memon, Jolaine Artist, MD  levothyroxine (SYNTHROID, LEVOTHROID) 100 MCG tablet Take 100 mcg by mouth daily before breakfast.   Yes [provider]  levothyroxine (SYNTHROID, LEVOTHROID) 200 MCG tablet Take 200 mcg by mouth daily before breakfast.   Yes [provider]  losartan (COZAAR) 25 MG tablet Take 25 mg by mouth every morning. 12/01/16  Yes [provider]  montelukast (SINGULAIR) 10 MG tablet Take 10 mg by mouth. 08/07/16  Yes [provider]  omega-3 acid ethyl esters (LOVAZA) 1 g capsule Take 1 capsule by mouth daily.  11/26/16  Yes [provider]  simvastatin (ZOCOR) 5 MG tablet Take 5 mg by mouth daily. 09/14/16  Yes [provider]  traZODone (DESYREL) 50 MG tablet TAKE 1 TO 2 TABLETS AT BEDTIME FOR INSOMNIA 09/30/16  Yes [provider]  verapamil (CALAN-SR) 240 MG CR tablet Take 240 mg by mouth daily.   Yes [provider]     Family History     Family History  Problem Relation Age of Onset  . Cancer Mother        breast and lung  . Cancer Father        bladder  . Cancer Maternal Uncle        prostate  . Cancer Paternal Uncle        esophagus  . Colon cancer Neg Hx     Social History    Social History   Social History  . Marital status: Divorced    Spouse name: N/A  . Number of children: N/A  . Years of education: N/A   Occupational History  . Not on file.   Social History Main Topics  . Smoking status: Former Smoker    Packs/day: 1.00    Years: 30.00    Types: Cigarettes    Quit date: 08/04/2007  . Smokeless tobacco: Never Used  . Alcohol use No  . Drug use: No  . Sexual activity: Yes   Other Topics Concern  . Not on file   Social History Narrative  . No narrative on file     Review of Systems    General:  No chills, fever, night sweats or weight changes.  Cardiovascular:  No chest pain, dyspnea on exertion, edema, orthopnea, palpitations,  paroxysmal nocturnal dyspnea. Dermatological: No rash, lesions/masses Respiratory: No cough, dyspnea Urologic: No hematuria, dysuria Abdominal:   No nausea, vomiting, diarrhea, bright red blood per rectum, melena, or hematemesis Neurologic:  No visual changes, changes in mental status. + overall weakness All other systems reviewed and are otherwise negative except as noted above.  Physical Exam    Blood pressure (!) 159/88, pulse 62, temperature 98.5 F (36.9 C), temperature source Oral, resp. rate Marland Kitchen)  22, height 6' (1.829 m), weight (!) 301 lb 13 oz (136.9 kg), SpO2 100 %.  General: Pleasant, NAD Psych: Normal affect. Neuro: Alert and oriented X 3. Moves all extremities spontaneously. HEENT: Normal  Neck: Supple without bruits or JVD. Lungs:  Resp regular and unlabored, CTA, diminished in bases Heart: RRR no s3, s4, or murmurs. Abdomen: Soft, non-tender, non-distended, BS + x 4.  Extremities: No clubbing, cyanosis or edema. DP/PT/Radials 2+ and equal bilaterally.  Labs    Troponin (Point of Care Test) No results for input(s): TROPIPOC in the last 72 hours.  Recent Labs  01/19/17 1406 01/19/17 1721  TROPONINI 22.33* 26.31*   Lab Results  Component Value Date   WBC 10.9 (H) 01/20/2017   HGB 14.7 01/20/2017   HCT 43.2 01/20/2017   MCV 87.4 01/20/2017   PLT 133 (L) 01/20/2017    Recent Labs Lab 01/19/17 1406 01/20/17 0821  NA  --  136  K  --  3.8  CL  --  105  CO2  --  23  BUN  --  25*  CREATININE  --  2.20*  CALCIUM  --  8.9  PROT 7.1  --   BILITOT 1.5*  --   ALKPHOS 66  --   ALT 30  --   AST 109*  --   GLUCOSE  --  184*   Lab Results  Component Value Date   CHOL  07/23/2008    124        ATP III CLASSIFICATION:  <200     mg/dL   Desirable  200-239  mg/dL   Borderline High  >=240    mg/dL   High   HDL 27 (L) 07/23/2008   LDLCALC  07/23/2008    25        Total Cholesterol/HDL:CHD Risk Coronary Heart Disease Risk Table                     Men    Women  1/2 Average Risk   3.4   3.3   TRIG 362 (H) 07/23/2008   No results found for: [facility]   Radiology Studies    Dg Abd Acute W/chest  Result Date: 01/19/2017 CLINICAL DATA:  71 y/o  M; chest pain and vomiting. EXAM: DG ABDOMEN ACUTE W/ 1V CHEST COMPARISON:  07/24/2016 chest radiograph.  08/14/2016 PET-CT. FINDINGS: There is no evidence of dilated bowel loops or free intraperitoneal air. Splenomegaly. Cholecystectomy clips. No focal consolidation, effusion, or pneumothorax of lungs. Stable cardiac silhouette. Multilevel degenerative changes of the spine. IMPRESSION: 1. No acute cardiopulmonary process identified. 2. Normal bowel gas pattern. 3. Splenomegaly. Electronically Signed   By: Kristine Garbe M.D.   On: 01/19/2017 15:16    ECG & Cardiac Imaging    EKG 01/20/17: sinus rhythm, 1st degree block, LBBB  Echocardiogram 01/20/17: pending  Echocardiogram 07/25/14: Study Conclusions - Left ventricle: The cavity size was normal. Wall thickness was increased in a pattern of moderate LVH. Systolic function was normal. The estimated ejection fraction was in the range of 55% to 60%. Diastolic function is abnormal, indeterminate grade. - Aortic valve: Mildly to moderately calcified annulus. Trileaflet; mildly thickened leaflets. There was mild regurgitation. Regurgitation pressure half-time: 682 ms. - Mitral valve: Mildly to moderately calcified annulus. Mildly thickened leaflets . - Left atrium: The atrium was mildly dilated. - Technically difficult study.  Assessment & Plan    1. Chest pain, ? NSTEMI - troponin 22.33 --> 26.31 - elevated troponin  in the setting of CKD stage IV with sCr 2.20 - EKG with old LBBB - heparin drip  - continue ASA and zocor The patient describes atypical chest pain. However, he is diabetic and has an elevated troponin. This troponin is higher than what would be expected in the setting of CKD stage IV, question possible NSTEMI. He  will need further ischemic evaluation, likely with heart catheterization. Will ask nephrology to see patient and will schedule echo stat to determine timing of heart catheterization.  Attending to discuss risks for acute on chronic kidney injury following cath.    2. CKD stage IV - per primary team, nephrology consulted - D/C losartan   3. DM - per primary team - A1c pending   4. Elevated AST - AST 109, ALT WNL, not previously elevated per EPIC - per primary team; this may be secondary to NSTEMI   5. HTN - continue home verapamil - avoid nephrotoxic agents    6. HLD - lipid panel pending   Signed, Ledora Bottcher, PA-C 01/20/2017, 10:30 AM [phone]   I have examined the patient and reviewed assessment and plan and discussed with patient.  Agree with above as stated.  NSTEMI.  Atypical sx.  Plan for diagnostic cath.  Risks and benefits explained to the patient and he is willing to proceed.  Went over risks of kidney issues given his baseline renal insufficiency.  Limit dye.  He is aware of the risks.   Larae Grooms

## 2017-01-20 NOTE — Progress Notes (Addendum)
PROGRESS NOTE                                                                                                                                                                                                             Patient Demographics:    [name] [name], is a 71 y.o. male, DOB - 1945-12-23, OFB:[id number]  Admit date - 01/19/2017   Admitting Physician Courage Denton Brick, MD  Outpatient Primary MD for the patient is [name], [name]  Outpatient Specialists: Dr Obie Dredge --Nephrologist in [facility]  Chief Complaint  Patient presents with  . Weakness       Brief Narrative   71 year old morbidly obese male with chronic disease stage III, low-grade non-Hodgkin's lymphoma, diabetes mellitus, hypothyroidism, peripheral vascular disease, history of sleep apnea, ongoing occasional tobacco use who presented to [facility] ED with vague fatigue, nausea with generalized weakness of 24-hour duration. Also had nonbloody/nonbilious emesis. He had some right-sided chest discomfort 1 day prior to presentation which subsequently resolved. Denied any palpitations, shortness of breath, dizziness, headache, orthopnea, PND, fever, chills or productive cough. He is unable to ambulate more than a block due to pain in his legs.  In the ED EKG showed old left bundle branch block, no acute findings but his troponin was mildly elevated at 22. Patient started on IV heparin and transferred to [facility] for [facility]ology evaluation.   Subjective:   feels weak but denies any chest pain or shortness of breath.   Assessment  & Plan :    Principal Problem:   NSTEMI (non-ST elevated myocardial infarction) (HCC) -Continue heparin drip. Continue baby aspirin. Added metoprolol and statin.. Serial troponin progressively elevated. Stable on telemetry except for frequent PVCs. Check 2-D echo. Cardiology consult pending. Keep nothing by  mouth.  Given his chronic kidney disease will premedicate him for possible cardiac cath (ordered prednisone 50 mg to be given 7 hours, 3 hours and 1 hour prior to procedure and Benadryl 50 mg IV 1 are prior to procedure). Also ordered IV hydration with normal saline.  Active Problems:   Chronic kidney disease Stage 3 Renal function seems to be at baseline. Will hold his ARB. Pre-medicating for possible cardiac cath.     Type 2 diabetes mellitus with stage III chronic kidney disease (HCC) Lantus 40 units at bedtime (  patient is on tresiba 50 u am and 10 u pm). Monitor on sliding scale coverage.  Essential hypertension Stable. Added low dose metoprolol.    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  Hypothyroidism Continue Synthroid.   Morbid obesity ([name])    Code Status : Full code  Family Communication  : Girlfriend at bedside  Disposition Plan  : Home pending hospital course  Barriers For Discharge : Active symptoms  Consults  :  Cardiology  Procedures  :  2-D echo  DVT Prophylaxis  :  IV heparin  Lab Results  Component Value Date   PLT 133 (L) 01/20/2017    Antibiotics  :    Anti-infectives    None        Objective:   Vitals:   01/20/17 0500 01/20/17 0530 01/20/17 0600 01/20/17 0700  BP: (!) 154/84 (!) 160/83 (!) 156/87 (!) 159/88  Pulse: (!) 58 (!) 59 63 62  Resp: (!) 21 19 (!) 21 (!) 22  Temp:    98.5 F (36.9 C)  TempSrc:    Oral  SpO2: 98% 96% 95% 100%  Weight: (!) 136.9 kg (301 lb 13 oz)     Height:        Wt Readings from Last 3 Encounters:  01/20/17 (!) 136.9 kg (301 lb 13 oz)  12/08/16 (!) 138.1 kg (304 lb 6.4 oz)  10/08/16 (!) 143.8 kg (317 lb)     Intake/Output Summary (Last 24 hours) at 01/20/17 1108 Last data filed at 01/20/17 1000  Gross per 24 hour  Intake           227.94 ml  Output              300 ml  Net           -72.06 ml     Physical Exam  Gen: not in distress , Appears fatigued HEENT:  moist mucosa,  supple neck, no JVD Chest: clear b/l, no added sounds CVS: N S1&S2, no murmurs, rubs or gallop GI: soft, NT, ND, BS+ Musculoskeletal: warm, no edema     Data Review:    CBC  Recent Labs Lab 01/19/17 1359 01/20/17 0308  WBC 11.6* 10.9*  HGB 15.3 14.7  HCT 44.3 43.2  PLT 138* 133*  MCV 86.2 87.4  MCH 29.8 29.8  MCHC 34.5 34.0  RDW 15.7* 16.1*    Chemistries   Recent Labs Lab 01/19/17 1359 01/19/17 1406 01/20/17 0821  NA 135  --  136  K 3.8  --  3.8  CL 101  --  105  CO2 22  --  23  GLUCOSE 231*  --  184*  BUN 27*  --  25*  CREATININE 2.16*  --  2.20*  CALCIUM 9.3  --  8.9  AST  --  109*  --   ALT  --  30  --   ALKPHOS  --  66  --   BILITOT  --  1.5*  --    ------------------------------------------------------------------------------------------------------------------ No results for input(s): CHOL, HDL, LDLCALC, TRIG, CHOLHDL, LDLDIRECT in the last 72 hours.  Lab Results  Component Value Date   HGBA1C SEE SEPARATE REPORT 07/23/2014   ------------------------------------------------------------------------------------------------------------------  Recent Labs  01/19/17 1746  TSH 1.541   ------------------------------------------------------------------------------------------------------------------ No results for input(s): VITAMINB12, FOLATE, FERRITIN, TIBC, IRON, RETICCTPCT in the last 72 hours.  Coagulation profile No results for input(s): INR, PROTIME in the last 168 hours.  No results for input(s): DDIMER in the last 72  hours.  Cardiac Enzymes  Recent Labs Lab 01/19/17 1406 01/19/17 1721  TROPONINI 22.33* 26.31*   ------------------------------------------------------------------------------------------------------------------ No results found for: BNP  Inpatient Medications  Scheduled Meds: . aspirin EC  81 mg Oral Daily  . atorvastatin  40 mg Oral q1800  . clopidogrel  75 mg Oral Once  . fluticasone  1 spray Each Nare Daily    . gabapentin  100 mg Oral QHS  . insulin aspart  0-9 Units Subcutaneous TID WC  . insulin glargine  40 Units Subcutaneous QHS  . [START ON 01/21/2017] levothyroxine  300 mcg Oral QAC breakfast  . metoprolol tartrate  12.5 mg Oral BID  . omega-3 acid ethyl esters  1 capsule Oral Daily  . pneumococcal 23 valent vaccine  0.5 mL Intramuscular Tomorrow-1000  . senna  1 tablet Oral BID  . sodium chloride flush  3 mL Intravenous Q12H  . sodium chloride flush  3 mL Intravenous Q12H   Continuous Infusions: . sodium chloride    . sodium chloride 75 mL/hr at 01/20/17 0900  . heparin 1,750 Units/hr (01/20/17 1058)   PRN Meds:.sodium chloride, acetaminophen **OR** acetaminophen, albuterol, ondansetron **OR** ondansetron (ZOFRAN) IV, polyethylene glycol, sodium chloride flush, traZODone  Micro Results Recent Results (from the past 240 hour(s))  MRSA PCR Screening     Status: None   Collection Time: 01/19/17 10:06 PM  Result Value Ref Range Status   MRSA by PCR NEGATIVE NEGATIVE Final    Comment:        The GeneXpert MRSA Assay (FDA approved for NASAL specimens only), is one component of a comprehensive MRSA colonization surveillance program. It is not intended to diagnose MRSA infection nor to guide or monitor treatment for MRSA infections.     Radiology Reports Dg Abd Acute W/chest  Result Date: 01/19/2017 CLINICAL DATA:  71 y/o  M; chest pain and vomiting. EXAM: DG ABDOMEN ACUTE W/ 1V CHEST COMPARISON:  07/24/2016 chest radiograph.  08/14/2016 PET-CT. FINDINGS: There is no evidence of dilated bowel loops or free intraperitoneal air. Splenomegaly. Cholecystectomy clips. No focal consolidation, effusion, or pneumothorax of lungs. Stable cardiac silhouette. Multilevel degenerative changes of the spine. IMPRESSION: 1. No acute cardiopulmonary process identified. 2. Normal bowel gas pattern. 3. Splenomegaly. Electronically Signed   By: Kristine Garbe M.D.   On: 01/19/2017 15:16     Time Spent in minutes  35   Louellen Molder M.D on 01/20/2017 at 11:08 AM  Between 7am to 7pm - Pager - [phone]  After 7pm go to [email or web address] - password [facility]  [facility] -  Office  [phone]

## 2017-01-20 NOTE — Interval H&P Note (Signed)
Cath Lab Visit (complete for each Cath Lab visit)  Clinical Evaluation Leading to the Procedure:   ACS: Yes.    Non-ACS:    Anginal Classification: CCS IV  Anti-ischemic medical therapy: Minimal Therapy (1 class of medications)  Non-Invasive Test Results: No non-invasive testing performed  Prior CABG: No previous CABG   Elevated troponin   History and Physical Interval Note:  01/20/2017 2:34 PM  [name] [name]  has presented today for surgery, with the diagnosis of cp  The various methods of treatment have been discussed with the patient and family. After consideration of risks, benefits and other options for treatment, the patient has consented to  Procedure(s): Left Heart Cath and Coronary Angiography (N/A) as a surgical intervention .  The patient's history has been reviewed, patient examined, no change in status, stable for surgery.  I have reviewed the patient's chart and labs.  Questions were answered to the patient's satisfaction.     Larae Grooms

## 2017-01-20 NOTE — Consult Note (Signed)
Cardiology Consult    Patient ID: [name] [name] MRN: [id number], DOB/AGE: 1946/05/02   Admit date: 01/19/2017 Date of Consult: 01/20/2017  Primary Physician: [name] Cardiologist: new - Dr. Irish Lack Requesting Provider: Dr. Clementeen Graham  Reason for Consult: chest pain  Patient Profile    [name] [name] has a PMH significant for DM, CKD stage IV, non-hodgkin's lymphoma (s/p radiation in 2017 and chemotherapy finished in 2018), PVD, obesity, HTN, tobacco use, and hypothyroidism. He presented to [facility] ED with chest pain. Troponin was elevated and he was transferred to [facility] for further ischemic evaluation.  [name] [name] is a 71 y.o. male who is being seen today for the evaluation of chest pain and elevated troponin at the request of Dr. Clementeen Graham.   Past Medical History   Past Medical History:  Diagnosis Date  . Cancer (HCC)    low grade non hodgkin's lymphoma  . Chronic kidney disease    stage III kidney disease   . Diabetes mellitus   . Hypothyroidism   . Lymphoma ([place])   . Peripheral vascular disease (HCC)    neuropathy in toes   . Pneumonia    hx of   . S/P biopsy   . Shortness of breath    due ot pain in knees   . Sleep apnea    sleep study 3/29 13 ? location     Past Surgical History:  Procedure Laterality Date  . CHOLECYSTECTOMY  1992  . COLONOSCOPY N/A 09/03/2014   SLF:six colon polyps removed/small internal hemorrhoids  . ESOPHAGOGASTRODUODENOSCOPY N/A 09/03/2014   SLF: mild gastritis/few gastric polyps  . TOTAL KNEE ARTHROPLASTY  11/10/2011   Procedure: TOTAL KNEE ARTHROPLASTY;  Surgeon: Mauri Pole, MD;  Location: [facility] ORS;  Service: Orthopedics;  Laterality: Right;     Allergies  No Known Allergies  History of Present Illness    [name] [name] does not currently follow with cardiology. Yesterday, he woke up with an overall feeling of "unwell." He denies chest pain, but felt "not right" in his right chest. He became nauseated and  vomited once and then felt a little better. No other associated symptoms. He continues to feel weak and presented to [facility] ED for further evaluation. There, his troponin was greater than 20 in the setting of CKD stage IV. He was then transferred to [facility]. On my interview, he denies chest pain, SOB, palpitations, dizziness, and feelings of syncope. However, he reports continued weakness.  He has several risk factors for ACS including DM, obesity, HTN, and previous tobacco use. EKG with old LBBB.  Inpatient Medications    . aspirin EC  81 mg Oral Daily  . atorvastatin  40 mg Oral q1800  . atorvastatin  80 mg Oral Once  . clopidogrel  75 mg Oral Once  . fluticasone  1 spray Each Nare Daily  . gabapentin  100 mg Oral QHS  . insulin aspart  0-9 Units Subcutaneous TID WC  . insulin glargine  40 Units Subcutaneous QHS  . levothyroxine  200 mcg Oral QAC breakfast  . metoprolol tartrate  12.5 mg Oral BID  . omega-3 acid ethyl esters  1 capsule Oral Daily  . pneumococcal 23 valent vaccine  0.5 mL Intramuscular Tomorrow-1000  . senna  1 tablet Oral BID  . sodium chloride flush  3 mL Intravenous Q12H  . sodium chloride flush  3 mL Intravenous Q12H     Outpatient Medications    Prior to Admission medications  Medication Sig Start Date End Date Taking? Authorizing Provider  aspirin 81 MG tablet Take 81 mg by mouth daily.   Yes [provider]  fluticasone (FLONASE) 50 MCG/ACT nasal spray Place 1 spray into both nostrils daily.   Yes [provider]  gabapentin (NEURONTIN) 100 MG capsule Take 100 mg by mouth at bedtime.   Yes [provider]  insulin aspart (NOVOLOG FLEXPEN) 100 UNIT/ML FlexPen Inject into the skin. Sliding scale As needed for blood sugar   Yes [provider]  insulin degludec (TRESIBA FLEXTOUCH) 100 UNIT/ML SOPN FlexTouch Pen Inject 0.5 mLs (50 Units total) into the skin daily at 10 pm. Patient taking differently: Inject 60 Units into the  skin daily at 10 pm.  07/28/16  Yes Memon, Jolaine Artist, MD  levothyroxine (SYNTHROID, LEVOTHROID) 100 MCG tablet Take 100 mcg by mouth daily before breakfast.   Yes [provider]  levothyroxine (SYNTHROID, LEVOTHROID) 200 MCG tablet Take 200 mcg by mouth daily before breakfast.   Yes [provider]  losartan (COZAAR) 25 MG tablet Take 25 mg by mouth every morning. 12/01/16  Yes [provider]  montelukast (SINGULAIR) 10 MG tablet Take 10 mg by mouth. 08/07/16  Yes [provider]  omega-3 acid ethyl esters (LOVAZA) 1 g capsule Take 1 capsule by mouth daily.  11/26/16  Yes [provider]  simvastatin (ZOCOR) 5 MG tablet Take 5 mg by mouth daily. 09/14/16  Yes [provider]  traZODone (DESYREL) 50 MG tablet TAKE 1 TO 2 TABLETS AT BEDTIME FOR INSOMNIA 09/30/16  Yes [provider]  verapamil (CALAN-SR) 240 MG CR tablet Take 240 mg by mouth daily.   Yes [provider]     Family History     Family History  Problem Relation Age of Onset  . Cancer Mother        breast and lung  . Cancer Father        bladder  . Cancer Maternal Uncle        prostate  . Cancer Paternal Uncle        esophagus  . Colon cancer Neg Hx     Social History    Social History   Social History  . Marital status: Divorced    Spouse name: N/A  . Number of children: N/A  . Years of education: N/A   Occupational History  . Not on file.   Social History Main Topics  . Smoking status: Former Smoker    Packs/day: 1.00    Years: 30.00    Types: Cigarettes    Quit date: 08/04/2007  . Smokeless tobacco: Never Used  . Alcohol use No  . Drug use: No  . Sexual activity: Yes   Other Topics Concern  . Not on file   Social History Narrative  . No narrative on file     Review of Systems    General:  No chills, fever, night sweats or weight changes.  Cardiovascular:  No chest pain, dyspnea on exertion, edema, orthopnea, palpitations,  paroxysmal nocturnal dyspnea. Dermatological: No rash, lesions/masses Respiratory: No cough, dyspnea Urologic: No hematuria, dysuria Abdominal:   No nausea, vomiting, diarrhea, bright red blood per rectum, melena, or hematemesis Neurologic:  No visual changes, changes in mental status. + overall weakness All other systems reviewed and are otherwise negative except as noted above.  Physical Exam    Blood pressure (!) 159/88, pulse 62, temperature 98.5 F (36.9 C), temperature source Oral, resp. rate Marland Kitchen)  22, height 6' (1.829 m), weight (!) 301 lb 13 oz (136.9 kg), SpO2 100 %.  General: Pleasant, NAD Psych: Normal affect. Neuro: Alert and oriented X 3. Moves all extremities spontaneously. HEENT: Normal  Neck: Supple without bruits or JVD. Lungs:  Resp regular and unlabored, CTA, diminished in bases Heart: RRR no s3, s4, or murmurs. Abdomen: Soft, non-tender, non-distended, BS + x 4.  Extremities: No clubbing, cyanosis or edema. DP/PT/Radials 2+ and equal bilaterally.  Labs    Troponin (Point of Care Test) No results for input(s): TROPIPOC in the last 72 hours.  Recent Labs  01/19/17 1406 01/19/17 1721  TROPONINI 22.33* 26.31*   Lab Results  Component Value Date   WBC 10.9 (H) 01/20/2017   HGB 14.7 01/20/2017   HCT 43.2 01/20/2017   MCV 87.4 01/20/2017   PLT 133 (L) 01/20/2017    Recent Labs Lab 01/19/17 1406 01/20/17 0821  NA  --  136  K  --  3.8  CL  --  105  CO2  --  23  BUN  --  25*  CREATININE  --  2.20*  CALCIUM  --  8.9  PROT 7.1  --   BILITOT 1.5*  --   ALKPHOS 66  --   ALT 30  --   AST 109*  --   GLUCOSE  --  184*   Lab Results  Component Value Date   CHOL  07/23/2008    124        ATP III CLASSIFICATION:  <200     mg/dL   Desirable  200-239  mg/dL   Borderline High  >=240    mg/dL   High   HDL 27 (L) 07/23/2008   LDLCALC  07/23/2008    25        Total Cholesterol/HDL:CHD Risk Coronary Heart Disease Risk Table                     Men    Women  1/2 Average Risk   3.4   3.3   TRIG 362 (H) 07/23/2008   No results found for: [facility]   Radiology Studies    Dg Abd Acute W/chest  Result Date: 01/19/2017 CLINICAL DATA:  71 y/o  M; chest pain and vomiting. EXAM: DG ABDOMEN ACUTE W/ 1V CHEST COMPARISON:  07/24/2016 chest radiograph.  08/14/2016 PET-CT. FINDINGS: There is no evidence of dilated bowel loops or free intraperitoneal air. Splenomegaly. Cholecystectomy clips. No focal consolidation, effusion, or pneumothorax of lungs. Stable cardiac silhouette. Multilevel degenerative changes of the spine. IMPRESSION: 1. No acute cardiopulmonary process identified. 2. Normal bowel gas pattern. 3. Splenomegaly. Electronically Signed   By: Kristine Garbe M.D.   On: 01/19/2017 15:16    ECG & Cardiac Imaging    EKG 01/20/17: sinus rhythm, 1st degree block, LBBB  Echocardiogram 01/20/17: pending  Echocardiogram 07/25/14: Study Conclusions - Left ventricle: The cavity size was normal. Wall thickness was increased in a pattern of moderate LVH. Systolic function was normal. The estimated ejection fraction was in the range of 55% to 60%. Diastolic function is abnormal, indeterminate grade. - Aortic valve: Mildly to moderately calcified annulus. Trileaflet; mildly thickened leaflets. There was mild regurgitation. Regurgitation pressure half-time: 682 ms. - Mitral valve: Mildly to moderately calcified annulus. Mildly thickened leaflets . - Left atrium: The atrium was mildly dilated. - Technically difficult study.  Assessment & Plan    1. Chest pain, ? NSTEMI - troponin 22.33 --> 26.31 - elevated troponin  in the setting of CKD stage IV with sCr 2.20 - EKG with old LBBB - heparin drip  - continue ASA and zocor The patient describes atypical chest pain. However, he is diabetic and has an elevated troponin. This troponin is higher than what would be expected in the setting of CKD stage IV, question possible NSTEMI. He  will need further ischemic evaluation, likely with heart catheterization. Will ask nephrology to see patient and will schedule echo stat to determine timing of heart catheterization.  Attending to discuss risks for acute on chronic kidney injury following cath.    2. CKD stage IV - per primary team, nephrology consulted - D/C losartan   3. DM - per primary team - A1c pending   4. Elevated AST - AST 109, ALT WNL, not previously elevated per EPIC - per primary team; this may be secondary to NSTEMI   5. HTN - continue home verapamil - avoid nephrotoxic agents    6. HLD - lipid panel pending   Signed, Ledora Bottcher, PA-C 01/20/2017, 10:30 AM [phone]   I have examined the patient and reviewed assessment and plan and discussed with patient.  Agree with above as stated.  NSTEMI.  Atypical sx.  Plan for diagnostic cath.  Risks and benefits explained to the patient and he is willing to proceed.  Went over risks of kidney issues given his baseline renal insufficiency.  Limit dye.  He is aware of the risks.   Larae Grooms

## 2017-01-20 NOTE — Care Management (Signed)
CM received consult for medication assistance.  Pt has active/verified insurance in epic and therefore for current medications per [facility] - CM will not be able to provide medication assistance - CM will continue to follow

## 2017-01-20 NOTE — Progress Notes (Signed)
  Echocardiogram 2D Echocardiogram with definity has been performed.  Darlina Sicilian M 01/20/2017, 12:54 PM

## 2017-01-20 NOTE — Progress Notes (Signed)
ANTICOAGULATION CONSULT NOTE - Follow Up Consult  Pharmacy Consult for Heparin  Indication: chest pain/ACS  No Known Allergies  Patient Measurements: Height: 6' (182.9 cm) Weight: (!) 301 lb 13 oz (136.9 kg) IBW/kg (Calculated) : 77.6  Vital Signs: Temp: 98.3 F (36.8 C) (06/20 0000) Temp Source: Oral (06/20 0000) BP: 153/90 (06/20 0030) Pulse Rate: 59 (06/20 0030)  Labs:  Recent Labs  01/19/17 1359 01/19/17 1406 01/19/17 1721 01/20/17 0007  HGB 15.3  --   --   --   HCT 44.3  --   --   --   PLT 138*  --   --   --   HEPARINUNFRC  --   --   --  <0.10*  CREATININE 2.16*  --   --   --   TROPONINI  --  22.33* 26.31*  --     Estimated Creatinine Clearance: 46.2 mL/min (A) (by C-G formula based on SCr of 2.16 mg/dL (H)).  Assessment: 71 y/o M transfer from [facility] with NSTEMI, awaiting cardiology evaluation, on heparin drip, initial heparin level is sub-therapeutic, no issues per RN.   Goal of Therapy:  Heparin level 0.3-0.7 units/ml Monitor platelets by anticoagulation protocol: Yes   Plan:  -Heparin 3000 units BOLUS -Inc heparin drip to 1400 units/hr -1000 HL  Delesia Martinek 01/20/2017,1:30 AM

## 2017-01-20 NOTE — Consult Note (Signed)
[facility] Consult Note     Date: 01/20/2017                  Patient Name:  [name] [name]  MRN: [id number]  DOB: 12-Dec-1945  Age / Sex: 71 y.o., male         PCP: [name], [place] Requesting Consult: [facility], Dr. Clementeen Graham                 Reason for Consult: CKD, plan for cath             Chief Complaint: fatigue, weakness, generalized discomfort  HPI: Pt is a 65M with a PMH significant for non-hodkin's lymphoma s/p splenectomy, obesity, DM II, HTN, and CKD with a baseline creatinine of mid 2s since 2015 who is now seen in consultation at the request of Dr. Clementeen Graham for evaluation and recommendations surrounding CKD and possible cardiac cath.    Briefly, pt was in his usual state of health until the day prior to admission when he felt increasing weakness, fatigue, and malaise.  His chest didn't hurt but felt a little "funny."  He is now found to have had an NSTEMI with troponins rising to 26 now.  There is a tentative plan for cath.  On a heparin gtt.  Pt has no complaints other than being hungry right now.   Past Medical History:  Diagnosis Date  . Cancer (HCC)    low grade non hodgkin's lymphoma  . Chronic kidney disease    stage III kidney disease   . Diabetes mellitus   . Hypothyroidism   . Lymphoma ([place])   . Peripheral vascular disease (HCC)    neuropathy in toes   . Pneumonia    hx of   . S/P biopsy   . Shortness of breath    due ot pain in knees   . Sleep apnea    sleep study 3/29 13 ? location     Past Surgical History:  Procedure Laterality Date  . CHOLECYSTECTOMY  1992  . COLONOSCOPY N/A 09/03/2014   SLF:six colon polyps removed/small internal hemorrhoids  . ESOPHAGOGASTRODUODENOSCOPY N/A 09/03/2014   SLF: mild gastritis/few gastric polyps  . TOTAL KNEE ARTHROPLASTY  11/10/2011   Procedure: TOTAL KNEE ARTHROPLASTY;  Surgeon: Mauri Pole, MD;  Location: [facility];  Service: Orthopedics;   Laterality: Right;    Family History  Problem Relation Age of Onset  . Cancer Mother        breast and lung  . Cancer Father        bladder  . Cancer Maternal Uncle        prostate  . Cancer Paternal Uncle        esophagus  . Colon cancer Neg Hx    Social History:  reports that he quit smoking about 9 years ago. His smoking use included Cigarettes. He has a 30.00 pack-year smoking history. He has never used smokeless tobacco. He reports that he does not drink alcohol or use drugs.  Allergies: No Known Allergies  Medications Prior to Admission  Medication Sig Dispense Refill  . aspirin 81 MG tablet Take 81 mg by mouth daily.    . fluticasone (FLONASE) 50 MCG/ACT nasal spray Place 1 spray into both nostrils daily.    Marland Kitchen gabapentin (NEURONTIN) 100 MG capsule Take 100 mg by mouth at bedtime.    Marland Kitchen  insulin aspart (NOVOLOG FLEXPEN) 100 UNIT/ML FlexPen Inject into the skin. Sliding scale As needed for blood sugar    . insulin degludec (TRESIBA FLEXTOUCH) 100 UNIT/ML SOPN FlexTouch Pen Inject 0.5 mLs (50 Units total) into the skin daily at 10 pm. (Patient taking differently: Inject 60 Units into the skin daily at 10 pm. )    . levothyroxine (SYNTHROID, LEVOTHROID) 100 MCG tablet Take 100 mcg by mouth daily before breakfast.    . levothyroxine (SYNTHROID, LEVOTHROID) 200 MCG tablet Take 200 mcg by mouth daily before breakfast.    . losartan (COZAAR) 25 MG tablet Take 25 mg by mouth every morning.    . montelukast (SINGULAIR) 10 MG tablet Take 10 mg by mouth.    . omega-3 acid ethyl esters (LOVAZA) 1 g capsule Take 1 capsule by mouth daily.     . simvastatin (ZOCOR) 5 MG tablet Take 5 mg by mouth daily.    . traZODone (DESYREL) 50 MG tablet TAKE 1 TO 2 TABLETS AT BEDTIME FOR INSOMNIA    . verapamil (CALAN-SR) 240 MG CR tablet Take 240 mg by mouth daily.      Results for orders placed or performed during the hospital encounter of 01/19/17 (from the past 48 hour(s))  CBG monitoring, ED      Status: Abnormal   Collection Time: 01/19/17  1:23 PM  Result Value Ref Range   Glucose-Capillary 227 (H) 65 - 99 mg/dL  Basic metabolic panel     Status: Abnormal   Collection Time: 01/19/17  1:59 PM  Result Value Ref Range   Sodium 135 135 - 145 mmol/L   Potassium 3.8 3.5 - 5.1 mmol/L   Chloride 101 101 - 111 mmol/L   CO2 22 22 - 32 mmol/L   Glucose, Bld 231 (H) 65 - 99 mg/dL   BUN 27 (H) 6 - 20 mg/dL   Creatinine, Ser 2.16 (H) 0.61 - 1.24 mg/dL   Calcium 9.3 8.9 - 10.3 mg/dL   GFR calc non Af Amer 29 (L) >60 mL/min   GFR calc Af Amer 34 (L) >60 mL/min    Comment: (NOTE) The eGFR has been calculated using the CKD EPI equation. This calculation has not been validated in all clinical situations. eGFR's persistently <60 mL/min signify possible Chronic Kidney Disease.    Anion gap 12 5 - 15  CBC     Status: Abnormal   Collection Time: 01/19/17  1:59 PM  Result Value Ref Range   WBC 11.6 (H) 4.0 - 10.5 K/uL   RBC 5.14 4.22 - 5.81 MIL/uL   Hemoglobin 15.3 13.0 - 17.0 g/dL   HCT 44.3 39.0 - 52.0 %   MCV 86.2 78.0 - 100.0 fL   MCH 29.8 26.0 - 34.0 pg   MCHC 34.5 30.0 - 36.0 g/dL   RDW 15.7 (H) 11.5 - 15.5 %   Platelets 138 (L) 150 - 400 K/uL  Hepatic function panel     Status: Abnormal   Collection Time: 01/19/17  2:06 PM  Result Value Ref Range   Total Protein 7.1 6.5 - 8.1 g/dL   Albumin 4.0 3.5 - 5.0 g/dL   AST 109 (H) 15 - 41 U/L   ALT 30 17 - 63 U/L   Alkaline Phosphatase 66 38 - 126 U/L   Total Bilirubin 1.5 (H) 0.3 - 1.2 mg/dL   Bilirubin, Direct 0.3 0.1 - 0.5 mg/dL   Indirect Bilirubin 1.2 (H) 0.3 - 0.9 mg/dL  Lipase, blood  Status: None   Collection Time: 01/19/17  2:06 PM  Result Value Ref Range   Lipase 25 11 - 51 U/L  Troponin I     Status: Abnormal   Collection Time: 01/19/17  2:06 PM  Result Value Ref Range   Troponin I 22.33 (HH) <0.03 ng/mL    Comment: CRITICAL RESULT CALLED TO, READ BACK BY AND VERIFIED WITH: CARDWELL,L AT 1530 ON 6.19.2018 BY  ISLEY,B   I-Stat CG4 Lactic Acid, ED     Status: None   Collection Time: 01/19/17  2:20 PM  Result Value Ref Range   Lactic Acid, Venous 1.84 0.5 - 1.9 mmol/L  Urinalysis, Routine w reflex microscopic     Status: Abnormal   Collection Time: 01/19/17  3:30 PM  Result Value Ref Range   Color, Urine YELLOW YELLOW   APPearance HAZY (A) CLEAR   Specific Gravity, Urine 1.021 1.005 - 1.030   pH 5.0 5.0 - 8.0   Glucose, UA 150 (A) NEGATIVE mg/dL   Hgb urine dipstick SMALL (A) NEGATIVE   Bilirubin Urine NEGATIVE NEGATIVE   Ketones, ur NEGATIVE NEGATIVE mg/dL   Protein, ur 100 (A) NEGATIVE mg/dL   Nitrite NEGATIVE NEGATIVE   Leukocytes, UA NEGATIVE NEGATIVE   RBC / HPF 0-5 0 - 5 RBC/hpf   WBC, UA 0-5 0 - 5 WBC/hpf   Bacteria, UA RARE (A) NONE SEEN   Squamous Epithelial / LPF 0-5 (A) NONE SEEN   Mucous PRESENT    Hyaline Casts, UA PRESENT   Troponin I (q 6hr x 3)     Status: Abnormal   Collection Time: 01/19/17  5:21 PM  Result Value Ref Range   Troponin I 26.31 (HH) <0.03 ng/mL    Comment: CRITICAL VALUE NOTED.  VALUE IS CONSISTENT WITH PREVIOUSLY REPORTED AND CALLED VALUE.  TSH     Status: None   Collection Time: 01/19/17  5:46 PM  Result Value Ref Range   TSH 1.541 0.350 - 4.500 uIU/mL    Comment: Performed by a 3rd Generation assay with a functional sensitivity of <=0.01 uIU/mL.  Glucose, capillary     Status: Abnormal   Collection Time: 01/19/17  9:56 PM  Result Value Ref Range   Glucose-Capillary 205 (H) 65 - 99 mg/dL  MRSA PCR Screening     Status: None   Collection Time: 01/19/17 10:06 PM  Result Value Ref Range   MRSA by PCR NEGATIVE NEGATIVE    Comment:        The GeneXpert MRSA Assay (FDA approved for NASAL specimens only), is one component of a comprehensive MRSA colonization surveillance program. It is not intended to diagnose MRSA infection nor to guide or monitor treatment for MRSA infections.   Heparin level (unfractionated)     Status: Abnormal    Collection Time: 01/20/17 12:07 AM  Result Value Ref Range   Heparin Unfractionated <0.10 (L) 0.30 - 0.70 IU/mL    Comment:        IF HEPARIN RESULTS ARE BELOW EXPECTED VALUES, AND PATIENT DOSAGE HAS BEEN CONFIRMED, SUGGEST FOLLOW UP TESTING OF ANTITHROMBIN III LEVELS.   CBC     Status: Abnormal   Collection Time: 01/20/17  3:08 AM  Result Value Ref Range   WBC 10.9 (H) 4.0 - 10.5 K/uL   RBC 4.94 4.22 - 5.81 MIL/uL   Hemoglobin 14.7 13.0 - 17.0 g/dL   HCT 43.2 39.0 - 52.0 %   MCV 87.4 78.0 - 100.0 fL   MCH 29.8 26.0 -  34.0 pg   MCHC 34.0 30.0 - 36.0 g/dL   RDW 16.1 (H) 11.5 - 15.5 %   Platelets 133 (L) 150 - 400 K/uL  Glucose, capillary     Status: Abnormal   Collection Time: 01/20/17  7:31 AM  Result Value Ref Range   Glucose-Capillary 191 (H) 65 - 99 mg/dL  Basic metabolic panel     Status: Abnormal   Collection Time: 01/20/17  8:21 AM  Result Value Ref Range   Sodium 136 135 - 145 mmol/L   Potassium 3.8 3.5 - 5.1 mmol/L   Chloride 105 101 - 111 mmol/L   CO2 23 22 - 32 mmol/L   Glucose, Bld 184 (H) 65 - 99 mg/dL   BUN 25 (H) 6 - 20 mg/dL   Creatinine, Ser 2.20 (H) 0.61 - 1.24 mg/dL   Calcium 8.9 8.9 - 10.3 mg/dL   GFR calc non Af Amer 29 (L) >60 mL/min   GFR calc Af Amer 33 (L) >60 mL/min    Comment: (NOTE) The eGFR has been calculated using the CKD EPI equation. This calculation has not been validated in all clinical situations. eGFR's persistently <60 mL/min signify possible Chronic Kidney Disease.    Anion gap 8 5 - 15  Heparin level (unfractionated)     Status: Abnormal   Collection Time: 01/20/17  9:37 AM  Result Value Ref Range   Heparin Unfractionated 0.12 (L) 0.30 - 0.70 IU/mL    Comment:        IF HEPARIN RESULTS ARE BELOW EXPECTED VALUES, AND PATIENT DOSAGE HAS BEEN CONFIRMED, SUGGEST FOLLOW UP TESTING OF ANTITHROMBIN III LEVELS.   Lipid panel     Status: Abnormal   Collection Time: 01/20/17 11:30 AM  Result Value Ref Range   Cholesterol 116 0 -  200 mg/dL   Triglycerides 174 (H) <150 mg/dL   HDL 31 (L) >40 mg/dL   Total CHOL/HDL Ratio 3.7 RATIO   VLDL 35 0 - 40 mg/dL   LDL Cholesterol 50 0 - 99 mg/dL    Comment:        Total Cholesterol/HDL:CHD Risk Coronary Heart Disease Risk Table                     Men   Women  1/2 Average Risk   3.4   3.3  Average Risk       5.0   4.4  2 X Average Risk   9.6   7.1  3 X Average Risk  23.4   11.0        Use the calculated Patient Ratio above and the CHD Risk Table to determine the patient's CHD Risk.        ATP III CLASSIFICATION (LDL):  <100     mg/dL   Optimal  100-129  mg/dL   Near or Above                    Optimal  130-159  mg/dL   Borderline  160-189  mg/dL   High  >190     mg/dL   Very High   Glucose, capillary     Status: Abnormal   Collection Time: 01/20/17 12:17 PM  Result Value Ref Range   Glucose-Capillary 164 (H) 65 - 99 mg/dL   Dg Abd Acute W/chest  Result Date: 01/19/2017 CLINICAL DATA:  71 y/o  M; chest pain and vomiting. EXAM: DG ABDOMEN ACUTE W/ 1V CHEST COMPARISON:  07/24/2016 chest radiograph.  08/14/2016 PET-CT. FINDINGS: There is no evidence of dilated bowel loops or free intraperitoneal air. Splenomegaly. Cholecystectomy clips. No focal consolidation, effusion, or pneumothorax of lungs. Stable cardiac silhouette. Multilevel degenerative changes of the spine. IMPRESSION: 1. No acute cardiopulmonary process identified. 2. Normal bowel gas pattern. 3. Splenomegaly. Electronically Signed   By: Kristine Garbe M.D.   On: 01/19/2017 15:16    ROS: all other systems reviewed and are negative except as per HPI  Blood pressure (!) 146/97, pulse (!) 57, temperature 98.5 F (36.9 C), temperature source Oral, resp. rate (!) 21, height 6' (1.829 m), weight (!) 136.9 kg (301 lb 13 oz), SpO2 95 %. Physical Exam  GEN obese man, resting in bed HEENT EOMI, PERRL, MMM NECK thick no jVd PULM clear bilaterally CV RRR no m/r/g ABD soft protuberant nontender NABS,   EXT trace LE edema, some burnished appearing skin NEURO nonfocal  Assessment/Plan  1.  Chronic kidney disease Stage G3b/IV: creatinine at baseline.  There is an increased risk of CIN with cardiac cath in CKD which he and I have discussed.  To mitigate this risk if cath has been deemed necessary, recommend pre-cath hydration (ideally for 12 hours beforehand) and post-cath hydration for 6 hours afterwards.  Agree with holding diuretics.  2.  NSTEMI: on hep gtt, ASA, plavix, BB, atorvastatin, TTE done, possible cath as above  3.  DM II: per primary  4.  OSA: CPAP ordered.   Madelon Lips MD [facility] pgr [phone] 01/20/2017, 1:25 PM

## 2017-01-20 NOTE — Progress Notes (Signed)
ANTICOAGULATION CONSULT NOTE  Pharmacy Consult for heparin Indication: chest pain/ACS  No Known Allergies  Patient Measurements: Height: 6' (182.9 cm) Weight: (!) 301 lb 13 oz (136.9 kg) IBW/kg (Calculated) : 77.6 Heparin Dosing Weight: 109 Kg  Vital Signs: Temp: 98.5 F (36.9 C) (06/20 0700) Temp Source: Oral (06/20 0700) BP: 159/88 (06/20 0700) Pulse Rate: 62 (06/20 0700)  Labs:  Recent Labs  01/19/17 1359 01/19/17 1406 01/19/17 1721 01/20/17 0007 01/20/17 0308 01/20/17 0821 01/20/17 0937  HGB 15.3  --   --   --  14.7  --   --   HCT 44.3  --   --   --  43.2  --   --   PLT 138*  --   --   --  133*  --   --   HEPARINUNFRC  --   --   --  <0.10*  --   --  0.12*  CREATININE 2.16*  --   --   --   --  2.20*  --   TROPONINI  --  22.33* 26.31*  --   --   --   --     Estimated Creatinine Clearance: 45.4 mL/min (A) (by C-G formula based on SCr of 2.2 mg/dL (H)).   Medical History: Past Medical History:  Diagnosis Date  . Cancer (HCC)    low grade non hodgkin's lymphoma  . Chronic kidney disease    stage III kidney disease   . Diabetes mellitus   . Hypothyroidism   . Lymphoma ([place])   . Peripheral vascular disease (HCC)    neuropathy in toes   . Pneumonia    hx of   . S/P biopsy   . Shortness of breath    due ot pain in knees   . Sleep apnea    sleep study 3/29 13 ? location     Medications:  Prescriptions Prior to Admission  Medication Sig Dispense Refill Last Dose  . aspirin 81 MG tablet Take 81 mg by mouth daily.   01/19/2017 at Unknown time  . fluticasone (FLONASE) 50 MCG/ACT nasal spray Place 1 spray into both nostrils daily.   01/19/2017 at Unknown time  . gabapentin (NEURONTIN) 100 MG capsule Take 100 mg by mouth at bedtime.   01/18/2017 at Unknown time  . insulin aspart (NOVOLOG FLEXPEN) 100 UNIT/ML FlexPen Inject into the skin. Sliding scale As needed for blood sugar   01/18/2017 at Unknown time  . insulin degludec (TRESIBA FLEXTOUCH) 100 UNIT/ML SOPN  FlexTouch Pen Inject 0.5 mLs (50 Units total) into the skin daily at 10 pm. (Patient taking differently: Inject 60 Units into the skin daily at 10 pm. )   01/18/2017 at Unknown time  . levothyroxine (SYNTHROID, LEVOTHROID) 100 MCG tablet Take 100 mcg by mouth daily before breakfast.   01/19/2017 at Unknown time  . levothyroxine (SYNTHROID, LEVOTHROID) 200 MCG tablet Take 200 mcg by mouth daily before breakfast.   01/19/2017 at Unknown time  . losartan (COZAAR) 25 MG tablet Take 25 mg by mouth every morning.   01/19/2017 at Unknown time  . montelukast (SINGULAIR) 10 MG tablet Take 10 mg by mouth.   01/19/2017 at Unknown time  . omega-3 acid ethyl esters (LOVAZA) 1 g capsule Take 1 capsule by mouth daily.    01/19/2017 at Unknown time  . simvastatin (ZOCOR) 5 MG tablet Take 5 mg by mouth daily.   01/19/2017 at Unknown time  . traZODone (DESYREL) 50 MG tablet TAKE 1 TO  2 TABLETS AT BEDTIME FOR INSOMNIA   01/19/2017 at Unknown time  . verapamil (CALAN-SR) 240 MG CR tablet Take 240 mg by mouth daily.   01/19/2017 at Unknown time   Assessment: [name] [name] is a 71 y.o. male  transfered from [facility] with NSTEMI. Patient was initiated on heparin per Pharmacy prior to transfer. Not on anticoagulation PTA. Possible LHC after Renal consult per MD notes.  Heparin level remains subtherapeutic at 0.12 after re-bolus and rate increase. Hg wnl stable, plt low 133 but stable. No bleeding or IV line issues and drip has not been off at all per discussion with RN.   Goal of Therapy:  Heparin level 0.3-0.7 units/ml Monitor platelets by anticoagulation protocol: Yes   Plan:  Heparin 3000 unit bolus x 1 Increase heparin infusion to 1750 units/hr 6h heparin level Daily heparin level/CBC Monitor for s/sx bleeding F/u Cardiology plans   Elicia Lamp, PharmD, BCPS Clinical Pharmacist Rx Phone # for today: [phone] After 3:30PM, please call Main Rx: #[id number] 01/20/2017 10:31 AM

## 2017-01-21 ENCOUNTER — Encounter (HOSPITAL_COMMUNITY): Payer: Self-pay | Admitting: Interventional Cardiology

## 2017-01-21 ENCOUNTER — Other Ambulatory Visit: Payer: Self-pay

## 2017-01-21 ENCOUNTER — Encounter (HOSPITAL_COMMUNITY)
Admission: EM | Disposition: A | Discharge status: Home Health | Payer: Self-pay | Source: Home / Self Care | Attending: Internal Medicine

## 2017-01-21 HISTORY — PX: CORONARY STENT INTERVENTION: CATH118234

## 2017-01-21 LAB — HEMOGLOBIN A1C
Hgb A1c MFr Bld: 6.6 % — ABNORMAL HIGH (ref 4.8–5.6)
MEAN PLASMA GLUCOSE: 143 mg/dL

## 2017-01-21 LAB — BASIC METABOLIC PANEL
ANION GAP: 7 (ref 5–15)
BUN: 28 mg/dL — ABNORMAL HIGH (ref 6–20)
CALCIUM: 8.6 mg/dL — AB (ref 8.9–10.3)
CO2: 24 mmol/L (ref 22–32)
Chloride: 106 mmol/L (ref 101–111)
Creatinine, Ser: 2.15 mg/dL — ABNORMAL HIGH (ref 0.61–1.24)
GFR, EST AFRICAN AMERICAN: 34 mL/min — AB (ref >60)
GFR, EST NON AFRICAN AMERICAN: 30 mL/min — AB (ref >60)
Glucose, Bld: 212 mg/dL — ABNORMAL HIGH (ref 65–99)
POTASSIUM: 3.7 mmol/L (ref 3.5–5.1)
SODIUM: 137 mmol/L (ref 135–145)

## 2017-01-21 LAB — CBC
HEMATOCRIT: 41.5 % (ref 39.0–52.0)
HEMOGLOBIN: 14 g/dL (ref 13.0–17.0)
MCH: 29.7 pg (ref 26.0–34.0)
MCHC: 33.7 g/dL (ref 30.0–36.0)
MCV: 87.9 fL (ref 78.0–100.0)
PLATELETS: 145 10*3/uL — AB (ref 150–400)
RBC: 4.72 MIL/uL (ref 4.22–5.81)
RDW: 16.4 % — AB (ref 11.5–15.5)
WBC: 10.4 10*3/uL (ref 4.0–10.5)

## 2017-01-21 LAB — GLUCOSE, CAPILLARY
GLUCOSE-CAPILLARY: 199 mg/dL — AB (ref 65–99)
GLUCOSE-CAPILLARY: 207 mg/dL — AB (ref 65–99)
GLUCOSE-CAPILLARY: 132 mg/dL — AB (ref 65–99)
GLUCOSE-CAPILLARY: 194 mg/dL — AB (ref 65–99)

## 2017-01-21 LAB — HEPARIN LEVEL (UNFRACTIONATED): HEPARIN UNFRACTIONATED: 0.16 [IU]/mL — AB (ref 0.30–0.70)

## 2017-01-21 LAB — POCT ACTIVATED CLOTTING TIME: ACTIVATED CLOTTING TIME: 356 s

## 2017-01-21 SURGERY — CORONARY STENT INTERVENTION
Anesthesia: LOCAL

## 2017-01-21 MED ORDER — SODIUM CHLORIDE 0.9 % WEIGHT BASED INFUSION: 1.0000 mL/kg/h | INTRAVENOUS | Status: DC

## 2017-01-21 MED ORDER — VERAPAMIL HCL 2.5 MG/ML IV SOLN
INTRAVENOUS | Status: DC | PRN
  Administered 2017-01-21: 10 mL via INTRA_ARTERIAL

## 2017-01-21 MED ORDER — IOPAMIDOL (ISOVUE-370) INJECTION 76%
INTRAVENOUS | Status: AC
  Filled 2017-01-21: qty 100

## 2017-01-21 MED ORDER — SODIUM CHLORIDE 0.9 % WEIGHT BASED INFUSION: 3.0000 mL/kg/h | INTRAVENOUS | Status: DC

## 2017-01-21 MED ORDER — IOPAMIDOL (ISOVUE-370) INJECTION 76%
INTRAVENOUS | Status: AC
  Filled 2017-01-21: qty 125

## 2017-01-21 MED ORDER — NITROGLYCERIN 1 MG/10 ML FOR IR/CATH LAB
INTRA_ARTERIAL | Status: DC | PRN
  Administered 2017-01-21 (×3): 200 ug via INTRACORONARY

## 2017-01-21 MED ORDER — MIDAZOLAM HCL 2 MG/2ML IJ SOLN
INTRAMUSCULAR | Status: DC | PRN
  Administered 2017-01-21 (×2): 1 mg via INTRAVENOUS

## 2017-01-21 MED ORDER — SODIUM CHLORIDE 0.9% FLUSH: 3.0000 mL | INTRAVENOUS | Status: DC | PRN

## 2017-01-21 MED ORDER — CLOPIDOGREL BISULFATE 300 MG PO TABS
ORAL_TABLET | ORAL | Status: AC
  Filled 2017-01-21: qty 1

## 2017-01-21 MED ORDER — HEPARIN (PORCINE) IN NACL 2-0.9 UNIT/ML-% IJ SOLN
INTRAMUSCULAR | Status: AC | PRN
  Administered 2017-01-21: 1000 mL

## 2017-01-21 MED ORDER — VERAPAMIL HCL 2.5 MG/ML IV SOLN
INTRAVENOUS | Status: AC
  Filled 2017-01-21: qty 2

## 2017-01-21 MED ORDER — FENTANYL CITRATE (PF) 100 MCG/2ML IJ SOLN
INTRAMUSCULAR | Status: AC
  Filled 2017-01-21: qty 2

## 2017-01-21 MED ORDER — CLOPIDOGREL BISULFATE 75 MG PO TABS
75.0000 mg | ORAL_TABLET | Freq: Every day | ORAL | Status: DC
  Administered 2017-01-21 – 2017-01-22 (×2): 75 mg via ORAL
  Filled 2017-01-21 (×2): qty 1

## 2017-01-21 MED ORDER — SODIUM CHLORIDE 0.9% FLUSH: 3.0000 mL | Freq: Two times a day (BID) | INTRAVENOUS | Status: DC

## 2017-01-21 MED ORDER — IOPAMIDOL (ISOVUE-370) INJECTION 76%
INTRAVENOUS | Status: DC | PRN
  Administered 2017-01-21: 80 mL via INTRA_ARTERIAL

## 2017-01-21 MED ORDER — MIDAZOLAM HCL 2 MG/2ML IJ SOLN
INTRAMUSCULAR | Status: AC
  Filled 2017-01-21: qty 2

## 2017-01-21 MED ORDER — FENTANYL CITRATE (PF) 100 MCG/2ML IJ SOLN
INTRAMUSCULAR | Status: DC | PRN
  Administered 2017-01-21: 25 ug via INTRAVENOUS
  Administered 2017-01-21: 50 ug via INTRAVENOUS

## 2017-01-21 MED ORDER — SODIUM CHLORIDE 0.9 % IV SOLN
INTRAVENOUS | Status: AC
  Administered 2017-01-21: 17:00:00 via INTRAVENOUS

## 2017-01-21 MED ORDER — SODIUM CHLORIDE 0.9 % IV SOLN
INTRAVENOUS | Status: DC | PRN
  Administered 2017-01-21 (×2): 1.75 mg/kg/h via INTRAVENOUS

## 2017-01-21 MED ORDER — HYDRALAZINE HCL 20 MG/ML IJ SOLN: 5.0000 mg | INTRAMUSCULAR | Status: AC | PRN

## 2017-01-21 MED ORDER — LABETALOL HCL 5 MG/ML IV SOLN: 10.0000 mg | INTRAVENOUS | Status: AC | PRN

## 2017-01-21 MED ORDER — BIVALIRUDIN TRIFLUOROACETATE 250 MG IV SOLR
INTRAVENOUS | Status: AC
  Filled 2017-01-21: qty 250

## 2017-01-21 MED ORDER — SODIUM CHLORIDE 0.9 % IV SOLN: 250.0000 mL | INTRAVENOUS | Status: DC | PRN

## 2017-01-21 MED ORDER — NITROGLYCERIN 1 MG/10 ML FOR IR/CATH LAB
INTRA_ARTERIAL | Status: AC
  Filled 2017-01-21: qty 10

## 2017-01-21 MED ORDER — HEPARIN (PORCINE) IN NACL 2-0.9 UNIT/ML-% IJ SOLN
INTRAMUSCULAR | Status: AC
  Filled 2017-01-21: qty 1000

## 2017-01-21 MED ORDER — CLOPIDOGREL BISULFATE 300 MG PO TABS
ORAL_TABLET | ORAL | Status: DC | PRN
  Administered 2017-01-21: 300 mg via ORAL

## 2017-01-21 MED ORDER — HEPARIN SODIUM (PORCINE) 1000 UNIT/ML IJ SOLN
INTRAMUSCULAR | Status: AC
  Filled 2017-01-21: qty 1

## 2017-01-21 MED ORDER — LIDOCAINE HCL (PF) 1 % IJ SOLN
INTRAMUSCULAR | Status: DC | PRN
  Administered 2017-01-21: 2 mL via SUBCUTANEOUS

## 2017-01-21 MED ORDER — LIDOCAINE HCL (PF) 1 % IJ SOLN
INTRAMUSCULAR | Status: AC
  Filled 2017-01-21: qty 30

## 2017-01-21 MED ORDER — HEPARIN SODIUM (PORCINE) 5000 UNIT/ML IJ SOLN
5000.0000 [IU] | Freq: Three times a day (TID) | INTRAMUSCULAR | Status: DC
  Administered 2017-01-22: 5000 [IU] via SUBCUTANEOUS
  Filled 2017-01-21: qty 1

## 2017-01-21 MED ORDER — BIVALIRUDIN BOLUS VIA INFUSION - CUPID
INTRAVENOUS | Status: DC | PRN
Start: 2017-01-21 — End: 2017-01-21
  Administered 2017-01-21: 104.775 mg via INTRAVENOUS

## 2017-01-21 SURGICAL SUPPLY — 19 items
BALLN SAPPHIRE 2.5X15 (BALLOONS) ×2
BALLN ~~LOC~~ EMERGE MR 3.25X20 (BALLOONS) ×2
BALLN ~~LOC~~ EMERGE MR 3.75X12 (BALLOONS) ×2
BALLOON SAPPHIRE 2.5X15 (BALLOONS) ×1 IMPLANT
BALLOON ~~LOC~~ EMERGE MR 3.25X20 (BALLOONS) ×1 IMPLANT
BALLOON ~~LOC~~ EMERGE MR 3.75X12 (BALLOONS) ×1 IMPLANT
CATH LAUNCHER 6FR EBU3.5 (CATHETERS) ×2 IMPLANT
DEVICE RAD COMP TR BAND LRG (VASCULAR PRODUCTS) ×2 IMPLANT
GLIDESHEATH SLEND SS 6F .021 (SHEATH) ×2 IMPLANT
GUIDEWIRE INQWIRE 1.5J.035X260 (WIRE) ×1 IMPLANT
INQWIRE 1.5J .035X260CM (WIRE) ×2
KIT ENCORE 26 ADVANTAGE (KITS) ×4 IMPLANT
KIT HEART LEFT (KITS) ×2 IMPLANT
PACK CARDIAC CATHETERIZATION (CUSTOM PROCEDURE TRAY) ×2 IMPLANT
STENT SYNERGY DES 3X32 (Permanent Stent) ×2 IMPLANT
TRANSDUCER W/STOPCOCK (MISCELLANEOUS) ×2 IMPLANT
TUBING CIL FLEX 10 FLL-RA (TUBING) ×2 IMPLANT
WIRE HI TORQ VERSACORE-J 145CM (WIRE) ×2 IMPLANT
WIRE SAMURAI STR TIP 190CM (WIRE) ×2 IMPLANT

## 2017-01-21 NOTE — Interval H&P Note (Signed)
History and Physical Interval Note:  01/21/2017 3:00 PM  [name] [name]  has presented today for percutaneous coronary intervention, with the diagnosis of NSTEMI. The various methods of treatment have been discussed with the patient and family. After consideration of risks, benefits and other options for treatment, the patient has consented to  Procedure(s): Coronary Stent Intervention (N/A) as a surgical intervention .  The patient's history has been reviewed, patient examined, no change in status, stable for surgery.  I have reviewed the patient's chart and labs.  Questions were answered to the patient's satisfaction.    Cath Lab Visit (complete for each Cath Lab visit)  Clinical Evaluation Leading to the Procedure:   ACS: Yes.    Non-ACS:  (N/A)  [name] [name]

## 2017-01-21 NOTE — Progress Notes (Signed)
PROGRESS NOTE                                                                                                                                                                                                             Patient Demographics:    [name] [name], is a 71 y.o. male, DOB - 1946/02/12, EUM:[id number]  Admit date - 01/19/2017   Admitting Physician Courage Denton Brick, MD  Outpatient Primary MD for the patient is [name], [place]  Outpatient Specialists: Dr Obie Dredge --Nephrologist in [facility]  Chief Complaint  Patient presents with  . Weakness       Brief Narrative   71 year old morbidly obese male with chronic disease stage III, low-grade non-Hodgkin's lymphoma, diabetes mellitus, hypothyroidism, peripheral vascular disease, history of sleep apnea, ongoing occasional tobacco use who presented to [facility] ED with vague fatigue, nausea with generalized weakness of 24-hour duration. Also had nonbloody/nonbilious emesis. He had some right-sided chest discomfort 1 day prior to presentation which subsequently resolved. Denied any palpitations, shortness of breath, dizziness, headache, orthopnea, PND, fever, chills or productive cough. He is unable to ambulate more than a block due to pain in his legs.  In the ED EKG showed old left bundle branch block, no acute findings but his troponin was mildly elevated at 22. Patient started on IV heparin and transferred to [facility] for [facility]ology evaluation.  Cardiac cath done showed the following findings:  1st Mrg lesion, 100 %stenosed. This is the culprit for his recent MI.  Ramus lesion, 75 %stenosed. This is a relatively small vessel.  Mid LAD lesion, 80 %stenosed.  LV end diastolic pressure is moderately elevated.  There is no aortic valve stenosis.    Subjective:   Feels much better. No focal other chest pain or shortness of breath.   Assessment  & Plan :    Principal Problem:   NSTEMI (non-ST elevated myocardial infarction) ([place]) -Likely completed MI from occluded obtuse marginal. Plan on PCI of the LAD today. Continue heparin drip. Continue baby aspirin. Added metoprolol and statin.. Stable on telemetry. 2-D echo shows decreased EF of 45% with grade 2 diastolic dysfunction.    Active Problems:   Chronic kidney disease Stage 3 Renal function seems to be at baseline. Holding ARB. Premedicated with prednisone and Benadryl for cardiac cath. Also  getting the and Port-A-Cath hydration. Renal following given high risk for contrast-induced nephropathy.     Type 2 diabetes mellitus with stage III chronic kidney disease (HCC) Lantus 40 units at bedtime (patient is on tresiba 50 u am and 10 u pm). Monitor on sliding scale coverage.  Diabetes seems well controlled, A1c of 6.6.  Acute systolic CHF Possibly ischemic cardiomyopathy. Continue aspirin, added beta blocker and statin. Resume ARB if  renal function stable upon discharge.  Essential hypertension Stable. Added low dose metoprolol.    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  Hypothyroidism Continue Synthroid.   Morbid obesity ([place])    Code Status : Full code  Family Communication  : Girlfriend at bedside  Disposition Plan  : Home pending hospital course  Barriers For Discharge : Active symptoms  Consults  :  Cardiology  Procedures  :  2-D echo Left heart catheterization  DVT Prophylaxis  :  IV heparin  Lab Results  Component Value Date   PLT 145 (L) 01/21/2017    Antibiotics  :    Anti-infectives    None        Objective:   Vitals:   01/21/17 0745 01/21/17 0800 01/21/17 0830 01/21/17 1200  BP: (!) 145/87 (!) 148/86 (!) 145/80 119/82  Pulse: 72 66 65 (!) 59  Resp: 20 20 18 18   Temp:   98 F (36.7 C) 97.9 F (36.6 C)  TempSrc:   Oral Oral  SpO2: 96% 96% 94% 93%  Weight:      Height:        Wt Readings from Last 3  Encounters:  01/21/17 (!) 139.7 kg (307 lb 15.7 oz)  12/08/16 (!) 138.1 kg (304 lb 6.4 oz)  10/08/16 (!) 143.8 kg (317 lb)     Intake/Output Summary (Last 24 hours) at 01/21/17 1309 Last data filed at 01/21/17 1200  Gross per 24 hour  Intake             2452 ml  Output                0 ml  Net             2452 ml     Physical Exam Gen.: Elderly obese male not in distress HEENT: Moist mucosa, supple neck Chest: Clear bilaterally CVS: Normal S1 and S2, no murmurs GI: Soft, nontender, nondistended Musculoskeletal: Warm, no edema      Data Review:    CBC  Recent Labs Lab 01/19/17 1359 01/20/17 0308 01/21/17 0742  WBC 11.6* 10.9* 10.4  HGB 15.3 14.7 14.0  HCT 44.3 43.2 41.5  PLT 138* 133* 145*  MCV 86.2 87.4 87.9  MCH 29.8 29.8 29.7  MCHC 34.5 34.0 33.7  RDW 15.7* 16.1* 16.4*    Chemistries   Recent Labs Lab 01/19/17 1359 01/19/17 1406 01/20/17 0821 01/21/17 0742  NA 135  --  136 137  K 3.8  --  3.8 3.7  CL 101  --  105 106  CO2 22  --  23 24  GLUCOSE 231*  --  184* 212*  BUN 27*  --  25* 28*  CREATININE 2.16*  --  2.20* 2.15*  CALCIUM 9.3  --  8.9 8.6*  AST  --  109*  --   --   ALT  --  30  --   --   ALKPHOS  --  66  --   --   BILITOT  --  1.5*  --   --    ------------------------------------------------------------------------------------------------------------------  Recent Labs  01/20/17 1130  CHOL 116  HDL 31*  LDLCALC 50  TRIG 174*  CHOLHDL 3.7    Lab Results  Component Value Date   HGBA1C 6.6 (H) 01/20/2017   ------------------------------------------------------------------------------------------------------------------  Recent Labs  01/19/17 1746  TSH 1.541   ------------------------------------------------------------------------------------------------------------------ No results for input(s): VITAMINB12, FOLATE, FERRITIN, TIBC, IRON, RETICCTPCT in the last 72 hours.  Coagulation profile  Recent Labs Lab  01/20/17 1326  INR 1.12    No results for input(s): DDIMER in the last 72 hours.  Cardiac Enzymes  Recent Labs Lab 01/19/17 1406 01/19/17 1721  TROPONINI 22.33* 26.31*   ------------------------------------------------------------------------------------------------------------------ No results found for: BNP  Inpatient Medications  Scheduled Meds: . aspirin EC  81 mg Oral Daily  . atorvastatin  40 mg Oral q1800  . clopidogrel  75 mg Oral Daily  . fluticasone  1 spray Each Nare Daily  . gabapentin  100 mg Oral QHS  . insulin aspart  0-9 Units Subcutaneous TID WC  . insulin glargine  40 Units Subcutaneous QHS  . levothyroxine  300 mcg Oral QAC breakfast  . metoprolol tartrate  12.5 mg Oral BID  . omega-3 acid ethyl esters  1 capsule Oral Daily  . pneumococcal 23 valent vaccine  0.5 mL Intramuscular Tomorrow-1000  . senna  1 tablet Oral BID  . sodium chloride flush  3 mL Intravenous Q12H  . sodium chloride flush  3 mL Intravenous Q12H  . sodium chloride flush  3 mL Intravenous Q12H  . sodium chloride flush  3 mL Intravenous Q12H   Continuous Infusions: . sodium chloride    . sodium chloride    . sodium chloride    . sodium chloride    . heparin 2,100 Units/hr (01/21/17 0903)   PRN Meds:.sodium chloride, sodium chloride, sodium chloride, acetaminophen **OR** acetaminophen, albuterol, ondansetron **OR** ondansetron (ZOFRAN) IV, polyethylene glycol, sodium chloride flush, sodium chloride flush, sodium chloride flush, traZODone  Micro Results Recent Results (from the past 240 hour(s))  MRSA PCR Screening     Status: None   Collection Time: 01/19/17 10:06 PM  Result Value Ref Range Status   MRSA by PCR NEGATIVE NEGATIVE Final    Comment:        The GeneXpert MRSA Assay (FDA approved for NASAL specimens only), is one component of a comprehensive MRSA colonization surveillance program. It is not intended to diagnose MRSA infection nor to guide or monitor treatment  for MRSA infections.     Radiology Reports Dg Abd Acute W/chest  Result Date: 01/19/2017 CLINICAL DATA:  71 y/o  M; chest pain and vomiting. EXAM: DG ABDOMEN ACUTE W/ 1V CHEST COMPARISON:  07/24/2016 chest radiograph.  08/14/2016 PET-CT. FINDINGS: There is no evidence of dilated bowel loops or free intraperitoneal air. Splenomegaly. Cholecystectomy clips. No focal consolidation, effusion, or pneumothorax of lungs. Stable cardiac silhouette. Multilevel degenerative changes of the spine. IMPRESSION: 1. No acute cardiopulmonary process identified. 2. Normal bowel gas pattern. 3. Splenomegaly. Electronically Signed   By: Kristine Garbe M.D.   On: 01/19/2017 15:16    Time Spent in minutes  25   Louellen Molder M.D on 01/21/2017 at 1:09 PM  Between 7am to 7pm - Pager - [phone]  After 7pm go to [email or web address] - password [facility]  [facility] -  Office  [phone]

## 2017-01-21 NOTE — Progress Notes (Signed)
TR band removed at 2345. Site is clean, dry and level 0 with no oozing. Right Radial pulse is +2. Vital signs stable. Will continue to monitor.

## 2017-01-21 NOTE — Progress Notes (Signed)
Inpatient Diabetes Program Recommendations  AACE/ADA: New Consensus Statement on Inpatient Glycemic Control (2015)  Target Ranges:  Prepandial:   less than 140 mg/dL      Peak postprandial:   less than 180 mg/dL (1-2 hours)      Critically ill patients:  140 - 180 mg/dL   Lab Results  Component Value Date   GLUCAP 315 (H) 01/20/2017   HGBA1C 6.6 (H) 01/20/2017    Review of Glycemic Control  Results for RAKWON, LETOURNEAU (MRN [id number]) as of 01/21/2017 07:46  Ref. Range 01/19/2017 21:56 01/20/2017 07:31 01/20/2017 12:17 01/20/2017 16:17 01/20/2017 21:15  Glucose-Capillary Latest Ref Range: 65 - 99 mg/dL 205 (H) 191 (H) 164 (H) 159 (H) 315 (H)    Diabetes history: Type 2 Outpatient Diabetes medications: Novolog sliding scale, Tresiba 50 units qhs Current orders for Inpatient glycemic control: Novolog 0-9 units tid, Lantus 40 units qhs  Inpatient Diabetes Program Recommendations:  Please consider adding Novolog 0-5 units qhs.  Gentry Fitz, RN, BA, MHA, CDE Diabetes Coordinator Inpatient Diabetes Program  [phone] (Team Pager) [phone] ([place]) 01/21/2017 7:53 AM

## 2017-01-21 NOTE — H&P (View-Only) (Signed)
Progress Note  Patient Name: [name] [name] Date of Encounter: 01/21/2017  Primary Cardiologist: Irish Lack  Subjective   No further chest pain. Planned staged intervention today.   Inpatient Medications    Scheduled Meds: . aspirin EC  81 mg Oral Daily  . atorvastatin  40 mg Oral q1800  . clopidogrel  75 mg Oral Once  . fluticasone  1 spray Each Nare Daily  . gabapentin  100 mg Oral QHS  . insulin aspart  0-9 Units Subcutaneous TID WC  . insulin glargine  40 Units Subcutaneous QHS  . levothyroxine  300 mcg Oral QAC breakfast  . metoprolol tartrate  12.5 mg Oral BID  . omega-3 acid ethyl esters  1 capsule Oral Daily  . pneumococcal 23 valent vaccine  0.5 mL Intramuscular Tomorrow-1000  . senna  1 tablet Oral BID  . sodium chloride flush  3 mL Intravenous Q12H  . sodium chloride flush  3 mL Intravenous Q12H  . sodium chloride flush  3 mL Intravenous Q12H   Continuous Infusions: . sodium chloride    . sodium chloride    . heparin 1,750 Units/hr (01/20/17 2320)   PRN Meds: sodium chloride, sodium chloride, acetaminophen **OR** acetaminophen, albuterol, ondansetron **OR** ondansetron (ZOFRAN) IV, polyethylene glycol, sodium chloride flush, sodium chloride flush, traZODone   Vital Signs    Vitals:   01/21/17 0730 01/21/17 0745 01/21/17 0800 01/21/17 0830  BP: (!) 151/83 (!) 145/87 (!) 148/86 (!) 145/80  Pulse: 73 72 66 65  Resp: 19 20 20 18   Temp:      TempSrc:      SpO2: 96% 96% 96% 94%  Weight:      Height:        Intake/Output Summary (Last 24 hours) at 01/21/17 0844 Last data filed at 01/21/17 0800  Gross per 24 hour  Intake          2684.29 ml  Output                0 ml  Net          2684.29 ml   Filed Weights   01/19/17 2100 01/20/17 0500 01/21/17 0500  Weight: (!) 301 lb 13 oz (136.9 kg) (!) 301 lb 13 oz (136.9 kg) (!) 307 lb 15.7 oz (139.7 kg)    Telemetry    SR - Personally Reviewed  ECG    N/a - Personally Reviewed  Physical Exam    General: Well developed, well nourished, male appearing in no acute distress. Head: Normocephalic, atraumatic.  Neck: Supple without bruits, JVD. Lungs:  Resp regular and unlabored, CTA. Heart: RRR, S1, S2, no S3, S4, or murmur; no rub. Abdomen: Soft, non-tender, non-distended with normoactive bowel sounds. No hepatomegaly. No rebound/guarding. No obvious abdominal masses. Extremities: No clubbing, cyanosis, edema. Distal pedal pulses are 2+ bilaterally. Right radial site stable without hematoma.  Neuro: Alert and oriented X 3. Moves all extremities spontaneously. Psych: Normal affect.  Labs    Chemistry Recent Labs Lab 01/19/17 1359 01/19/17 1406 01/20/17 0821  NA 135  --  136  K 3.8  --  3.8  CL 101  --  105  CO2 22  --  23  GLUCOSE 231*  --  184*  BUN 27*  --  25*  CREATININE 2.16*  --  2.20*  CALCIUM 9.3  --  8.9  PROT  --  7.1  --   ALBUMIN  --  4.0  --   AST  --  109*  --   ALT  --  30  --   ALKPHOS  --  66  --   BILITOT  --  1.5*  --   GFRNONAA 29*  --  29*  GFRAA 34*  --  33*  ANIONGAP 12  --  8     Hematology Recent Labs Lab 01/19/17 1359 01/20/17 0308 01/21/17 0742  WBC 11.6* 10.9* 10.4  RBC 5.14 4.94 4.72  HGB 15.3 14.7 14.0  HCT 44.3 43.2 41.5  MCV 86.2 87.4 87.9  MCH 29.8 29.8 29.7  MCHC 34.5 34.0 33.7  RDW 15.7* 16.1* 16.4*  PLT 138* 133* 145*    Cardiac Enzymes Recent Labs Lab 01/19/17 1406 01/19/17 1721  TROPONINI 22.33* 26.31*   No results for input(s): TROPIPOC in the last 168 hours.   BNPNo results for input(s): BNP, PROBNP in the last 168 hours.   DDimer No results for input(s): DDIMER in the last 168 hours.    Radiology    Dg Abd Acute W/chest  Result Date: 01/19/2017 CLINICAL DATA:  71 y/o  M; chest pain and vomiting. EXAM: DG ABDOMEN ACUTE W/ 1V CHEST COMPARISON:  07/24/2016 chest radiograph.  08/14/2016 PET-CT. FINDINGS: There is no evidence of dilated bowel loops or free intraperitoneal air. Splenomegaly.  Cholecystectomy clips. No focal consolidation, effusion, or pneumothorax of lungs. Stable cardiac silhouette. Multilevel degenerative changes of the spine. IMPRESSION: 1. No acute cardiopulmonary process identified. 2. Normal bowel gas pattern. 3. Splenomegaly. Electronically Signed   By: Kristine Garbe M.D.   On: 01/19/2017 15:16    Cardiac Studies   LHC: 01/20/17  Conclusion     1st Mrg lesion, 100 %stenosed. This is the culprit for his recent MI.  Ramus lesion, 75 %stenosed. This is a relatively small vessel.  Mid LAD lesion, 80 %stenosed.  LV end diastolic pressure is moderately elevated.  There is no aortic valve stenosis.   Completed MI from occluded obtuse marginal.  Patient has been pain free and feels only fatigued.  We elected not to attempt intervention given the time course of his enzymes.    Plan for PCI of the LAD tomorrow if renal function is stable.    Patient Profile     71 y.o. male with PMH significant for DM, CKD stage IV, non-hodgkin's lymphoma (s/p radiation in 2017 and chemotherapy finished in 2018), PVD, obesity, HTN, tobacco use, and hypothyroidism. He presented to [facility] ED with chest pain, and found to have elevated Trops. Sent to [facility] for further work up.   Assessment & Plan    1. CAD/Chest pain/NSTEMI: Underwent LHC with Dr. Irish Lack yesterday with 100% stenosed first Mrg lesion, likely culprit for MI, with 80% mLAD lesion. Given elevated Cr, planned for staged intervention today. Trop peaked at 26.31.  -- continue IV heparin, BB, statin, ASA/plavix -- BMET pending.   2. CKD: Cr 2.20 yesterday prior to cath. BMET pending this morning.  -- home ARB held  3. DM: SSI -- Hgb A1c 6.6  4. HTN: Borderline controlled -- continue BB, ARB held given plans for staged intervention  5. HL: on statin -- LDL 50  Signed, Reino Bellis, NP  01/21/2017, 8:44 AM    I have examined the patient and reviewed assessment and plan and discussed  with patient.  Agree with above as stated.  Cr is stable.  Plan for LAD PCI.  He has been loaded with Plavix.  There is sometortuosity in the right subclavian but given his size,  it is preferable to go from a radial approach. Continue to hold ARB.  Larae Grooms

## 2017-01-21 NOTE — Progress Notes (Signed)
[place] for heparin Indication: chest pain/ACS  No Known Allergies  Patient Measurements: Height: 6' (182.9 cm) Weight: (!) 307 lb 15.7 oz (139.7 kg) IBW/kg (Calculated) : 77.6 Heparin Dosing Weight: 109 Kg  Vital Signs: Temp: 98.4 F (36.9 C) (06/21 0430) Temp Source: Oral (06/21 0430) BP: 130/82 (06/21 0430) Pulse Rate: 86 (06/21 0430)  Labs:  Recent Labs  01/19/17 1359 01/19/17 1406 01/19/17 1721 01/20/17 0007 01/20/17 0308 01/20/17 0821 01/20/17 0937 01/20/17 1326  HGB 15.3  --   --   --  14.7  --   --   --   HCT 44.3  --   --   --  43.2  --   --   --   PLT 138*  --   --   --  133*  --   --   --   LABPROT  --   --   --   --   --   --   --  14.5  INR  --   --   --   --   --   --   --  1.12  HEPARINUNFRC  --   --   --  <0.10*  --   --  0.12*  --   CREATININE 2.16*  --   --   --   --  2.20*  --   --   TROPONINI  --  22.33* 26.31*  --   --   --   --   --     Estimated Creatinine Clearance: 45.9 mL/min (A) (by C-G formula based on SCr of 2.2 mg/dL (H)).   Medical History: Past Medical History:  Diagnosis Date  . Cancer (HCC)    low grade non hodgkin's lymphoma  . Chronic kidney disease    stage III kidney disease   . Diabetes mellitus   . Hypothyroidism   . Lymphoma ([place])   . Peripheral vascular disease (HCC)    neuropathy in toes   . Pneumonia    hx of   . S/P biopsy   . Shortness of breath    due ot pain in knees   . Sleep apnea    sleep study 3/29 13 ? location     Medications:  Prescriptions Prior to Admission  Medication Sig Dispense Refill Last Dose  . aspirin 81 MG tablet Take 81 mg by mouth daily.   01/19/2017 at Unknown time  . fluticasone (FLONASE) 50 MCG/ACT nasal spray Place 1 spray into both nostrils daily.   01/19/2017 at Unknown time  . gabapentin (NEURONTIN) 100 MG capsule Take 100 mg by mouth at bedtime.   01/18/2017 at Unknown time  . insulin aspart (NOVOLOG FLEXPEN) 100 UNIT/ML FlexPen Inject  into the skin. Sliding scale As needed for blood sugar   01/18/2017 at Unknown time  . insulin degludec (TRESIBA FLEXTOUCH) 100 UNIT/ML SOPN FlexTouch Pen Inject 0.5 mLs (50 Units total) into the skin daily at 10 pm. (Patient taking differently: Inject 60 Units into the skin daily at 10 pm. )   01/18/2017 at Unknown time  . levothyroxine (SYNTHROID, LEVOTHROID) 100 MCG tablet Take 100 mcg by mouth daily before breakfast.   01/19/2017 at Unknown time  . levothyroxine (SYNTHROID, LEVOTHROID) 200 MCG tablet Take 200 mcg by mouth daily before breakfast.   01/19/2017 at Unknown time  . losartan (COZAAR) 25 MG tablet Take 25 mg by mouth every morning.   01/19/2017 at Unknown time  .  montelukast (SINGULAIR) 10 MG tablet Take 10 mg by mouth.   01/19/2017 at Unknown time  . omega-3 acid ethyl esters (LOVAZA) 1 g capsule Take 1 capsule by mouth daily.    01/19/2017 at Unknown time  . simvastatin (ZOCOR) 5 MG tablet Take 5 mg by mouth daily.   01/19/2017 at Unknown time  . traZODone (DESYREL) 50 MG tablet TAKE 1 TO 2 TABLETS AT BEDTIME FOR INSOMNIA   01/19/2017 at Unknown time  . verapamil (CALAN-SR) 240 MG CR tablet Take 240 mg by mouth daily.   01/19/2017 at Unknown time   Assessment: [name] [name] is a 71 y.o. male  transfered from [facility] with NSTEMI. Patient was initiated on heparin per Pharmacy prior to transfer. Not on anticoagulation PTA. 6/20 LHC - no intervention on OM, planning PCI of LAD 6/21 if renal function stable. Heparin resumed 8h post-sheath removal.  Heparin level subtherapeutic at 0.16. Hg wnl stable, plt up 145. No issues with IV line or bleeding per RN and drip has not been off.  Goal of Therapy:  Heparin level 0.3-0.7 units/ml Monitor platelets by anticoagulation protocol: Yes   Plan:  Increase heparin infusion to 2100 units/hr 6h heparin level vs. cath Daily heparin level/CBC Monitor for s/sx bleeding PCI on 6/21 if renal function stable   Elicia Lamp, PharmD, BCPS Clinical  Pharmacist Rx Phone # for today: [phone] After 3:30PM, please call Main Rx: [phone] 01/21/2017 8:21 AM

## 2017-01-21 NOTE — Progress Notes (Signed)
Progress Note  Patient Name: [name] [name] Date of Encounter: 01/21/2017  Primary Cardiologist: Irish Lack  Subjective   No further chest pain. Planned staged intervention today.   Inpatient Medications    Scheduled Meds: . aspirin EC  81 mg Oral Daily  . atorvastatin  40 mg Oral q1800  . clopidogrel  75 mg Oral Once  . fluticasone  1 spray Each Nare Daily  . gabapentin  100 mg Oral QHS  . insulin aspart  0-9 Units Subcutaneous TID WC  . insulin glargine  40 Units Subcutaneous QHS  . levothyroxine  300 mcg Oral QAC breakfast  . metoprolol tartrate  12.5 mg Oral BID  . omega-3 acid ethyl esters  1 capsule Oral Daily  . pneumococcal 23 valent vaccine  0.5 mL Intramuscular Tomorrow-1000  . senna  1 tablet Oral BID  . sodium chloride flush  3 mL Intravenous Q12H  . sodium chloride flush  3 mL Intravenous Q12H  . sodium chloride flush  3 mL Intravenous Q12H   Continuous Infusions: . sodium chloride    . sodium chloride    . heparin 1,750 Units/hr (01/20/17 2320)   PRN Meds: sodium chloride, sodium chloride, acetaminophen **OR** acetaminophen, albuterol, ondansetron **OR** ondansetron (ZOFRAN) IV, polyethylene glycol, sodium chloride flush, sodium chloride flush, traZODone   Vital Signs    Vitals:   01/21/17 0730 01/21/17 0745 01/21/17 0800 01/21/17 0830  BP: (!) 151/83 (!) 145/87 (!) 148/86 (!) 145/80  Pulse: 73 72 66 65  Resp: 19 20 20 18   Temp:      TempSrc:      SpO2: 96% 96% 96% 94%  Weight:      Height:        Intake/Output Summary (Last 24 hours) at 01/21/17 0844 Last data filed at 01/21/17 0800  Gross per 24 hour  Intake          2684.29 ml  Output                0 ml  Net          2684.29 ml   Filed Weights   01/19/17 2100 01/20/17 0500 01/21/17 0500  Weight: (!) 301 lb 13 oz (136.9 kg) (!) 301 lb 13 oz (136.9 kg) (!) 307 lb 15.7 oz (139.7 kg)    Telemetry    SR - Personally Reviewed  ECG    N/a - Personally Reviewed  Physical Exam    General: Well developed, well nourished, male appearing in no acute distress. Head: Normocephalic, atraumatic.  Neck: Supple without bruits, JVD. Lungs:  Resp regular and unlabored, CTA. Heart: RRR, S1, S2, no S3, S4, or murmur; no rub. Abdomen: Soft, non-tender, non-distended with normoactive bowel sounds. No hepatomegaly. No rebound/guarding. No obvious abdominal masses. Extremities: No clubbing, cyanosis, edema. Distal pedal pulses are 2+ bilaterally. Right radial site stable without hematoma.  Neuro: Alert and oriented X 3. Moves all extremities spontaneously. Psych: Normal affect.  Labs    Chemistry Recent Labs Lab 01/19/17 1359 01/19/17 1406 01/20/17 0821  NA 135  --  136  K 3.8  --  3.8  CL 101  --  105  CO2 22  --  23  GLUCOSE 231*  --  184*  BUN 27*  --  25*  CREATININE 2.16*  --  2.20*  CALCIUM 9.3  --  8.9  PROT  --  7.1  --   ALBUMIN  --  4.0  --   AST  --  109*  --   ALT  --  30  --   ALKPHOS  --  66  --   BILITOT  --  1.5*  --   GFRNONAA 29*  --  29*  GFRAA 34*  --  33*  ANIONGAP 12  --  8     Hematology Recent Labs Lab 01/19/17 1359 01/20/17 0308 01/21/17 0742  WBC 11.6* 10.9* 10.4  RBC 5.14 4.94 4.72  HGB 15.3 14.7 14.0  HCT 44.3 43.2 41.5  MCV 86.2 87.4 87.9  MCH 29.8 29.8 29.7  MCHC 34.5 34.0 33.7  RDW 15.7* 16.1* 16.4*  PLT 138* 133* 145*    Cardiac Enzymes Recent Labs Lab 01/19/17 1406 01/19/17 1721  TROPONINI 22.33* 26.31*   No results for input(s): TROPIPOC in the last 168 hours.   BNPNo results for input(s): BNP, PROBNP in the last 168 hours.   DDimer No results for input(s): DDIMER in the last 168 hours.    Radiology    Dg Abd Acute W/chest  Result Date: 01/19/2017 CLINICAL DATA:  71 y/o  M; chest pain and vomiting. EXAM: DG ABDOMEN ACUTE W/ 1V CHEST COMPARISON:  07/24/2016 chest radiograph.  08/14/2016 PET-CT. FINDINGS: There is no evidence of dilated bowel loops or free intraperitoneal air. Splenomegaly.  Cholecystectomy clips. No focal consolidation, effusion, or pneumothorax of lungs. Stable cardiac silhouette. Multilevel degenerative changes of the spine. IMPRESSION: 1. No acute cardiopulmonary process identified. 2. Normal bowel gas pattern. 3. Splenomegaly. Electronically Signed   By: Kristine Garbe M.D.   On: 01/19/2017 15:16    Cardiac Studies   LHC: 01/20/17  Conclusion     1st Mrg lesion, 100 %stenosed. This is the culprit for his recent MI.  Ramus lesion, 75 %stenosed. This is a relatively small vessel.  Mid LAD lesion, 80 %stenosed.  LV end diastolic pressure is moderately elevated.  There is no aortic valve stenosis.   Completed MI from occluded obtuse marginal.  Patient has been pain free and feels only fatigued.  We elected not to attempt intervention given the time course of his enzymes.    Plan for PCI of the LAD tomorrow if renal function is stable.    Patient Profile     71 y.o. male with PMH significant for DM, CKD stage IV, non-hodgkin's lymphoma (s/p radiation in 2017 and chemotherapy finished in 2018), PVD, obesity, HTN, tobacco use, and hypothyroidism. He presented to [facility] ED with chest pain, and found to have elevated Trops. Sent to [facility] for further work up.   Assessment & Plan    1. CAD/Chest pain/NSTEMI: Underwent LHC with Dr. Irish Lack yesterday with 100% stenosed first Mrg lesion, likely culprit for MI, with 80% mLAD lesion. Given elevated Cr, planned for staged intervention today. Trop peaked at 26.31.  -- continue IV heparin, BB, statin, ASA/plavix -- BMET pending.   2. CKD: Cr 2.20 yesterday prior to cath. BMET pending this morning.  -- home ARB held  3. DM: SSI -- Hgb A1c 6.6  4. HTN: Borderline controlled -- continue BB, ARB held given plans for staged intervention  5. HL: on statin -- LDL 50  Signed, Reino Bellis, NP  01/21/2017, 8:44 AM    I have examined the patient and reviewed assessment and plan and discussed  with patient.  Agree with above as stated.  Cr is stable.  Plan for LAD PCI.  He has been loaded with Plavix.  There is sometortuosity in the right subclavian but given his size,  it is preferable to go from a radial approach. Continue to hold ARB.  Larae Grooms

## 2017-01-21 NOTE — Progress Notes (Signed)
[facility] Progress Note    Assessment/ Plan:   1.  Chronic kidney disease Stage G3b/IV: creatinine at baseline.  There is an increased risk of CIN with cardiac cath in CKD which he and I have discussed.  He is on appropriate pre and post cath hydration.  We discussed today that the risk of CIN is greater with each contrast load.  He is aware of these risks.  Hopefully we will avoid dialysis during this admission.  Continue to hold diuretics.  2.  NSTEMI: on hep gtt, ASA, plavix, BB, atorvastatin, TTE done, diagnostic cath yesterday, plan for intervention today.  3.  DM II: per primary  4.  OSA: CPAP ordered.  Subjective:    Had a Cath yesterday, plan for intervention PCI today.   Objective:   BP (!) 145/80   Pulse 65   Temp 98 F (36.7 C) (Oral)   Resp 18   Ht 6' (1.829 m)   Wt (!) 139.7 kg (307 lb 15.7 oz)   SpO2 94%   BMI 41.77 kg/m   Intake/Output Summary (Last 24 hours) at 01/21/17 0853 Last data filed at 01/21/17 0800  Gross per 24 hour  Intake          2684.29 ml  Output                0 ml  Net          2684.29 ml   Weight change: 3.621 kg (7 lb 15.7 oz)  Physical Exam: GEN obese man, resting in bed, admittedly a little nervous HEENT EOMI, PERRL, MMM NECK thick no jvd PULM clear bilaterally CV RRR no m/r/g ABD soft protuberant nontender NABS,  EXT trace LE edema, some burnished appearing skin NEURO nonfocal  Imaging: Dg Abd Acute W/chest  Result Date: 01/19/2017 CLINICAL DATA:  71 y/o  M; chest pain and vomiting. EXAM: DG ABDOMEN ACUTE W/ 1V CHEST COMPARISON:  07/24/2016 chest radiograph.  08/14/2016 PET-CT. FINDINGS: There is no evidence of dilated bowel loops or free intraperitoneal air. Splenomegaly. Cholecystectomy clips. No focal consolidation, effusion, or pneumothorax of lungs. Stable cardiac silhouette. Multilevel degenerative changes of the spine. IMPRESSION: 1. No acute cardiopulmonary process identified. 2. Normal bowel gas  pattern. 3. Splenomegaly. Electronically Signed   By: Kristine Garbe M.D.   On: 01/19/2017 15:16    Labs: BMET  Recent Labs Lab 01/19/17 1359 01/20/17 0821  NA 135 136  K 3.8 3.8  CL 101 105  CO2 22 23  GLUCOSE 231* 184*  BUN 27* 25*  CREATININE 2.16* 2.20*  CALCIUM 9.3 8.9   CBC  Recent Labs Lab 01/19/17 1359 01/20/17 0308 01/21/17 0742  WBC 11.6* 10.9* 10.4  HGB 15.3 14.7 14.0  HCT 44.3 43.2 41.5  MCV 86.2 87.4 87.9  PLT 138* 133* 145*    Medications:    . aspirin EC  81 mg Oral Daily  . atorvastatin  40 mg Oral q1800  . clopidogrel  75 mg Oral Once  . fluticasone  1 spray Each Nare Daily  . gabapentin  100 mg Oral QHS  . insulin aspart  0-9 Units Subcutaneous TID WC  . insulin glargine  40 Units Subcutaneous QHS  . levothyroxine  300 mcg Oral QAC breakfast  . metoprolol tartrate  12.5 mg Oral BID  . omega-3 acid ethyl esters  1 capsule Oral Daily  . pneumococcal 23 valent vaccine  0.5 mL Intramuscular Tomorrow-1000  . senna  1 tablet Oral BID  .  sodium chloride flush  3 mL Intravenous Q12H  . sodium chloride flush  3 mL Intravenous Q12H  . sodium chloride flush  3 mL Intravenous Q12H      Madelon Lips, MD [facility] pgr [phone] 01/21/2017, 8:53 AM

## 2017-01-22 ENCOUNTER — Telehealth: Payer: Self-pay

## 2017-01-22 ENCOUNTER — Encounter (HOSPITAL_COMMUNITY): Payer: Self-pay | Admitting: Internal Medicine

## 2017-01-22 DIAGNOSIS — N183 Chronic kidney disease, stage 3 (moderate): Secondary | ICD-10-CM

## 2017-01-22 LAB — CBC
HEMATOCRIT: 40.9 % (ref 39.0–52.0)
HEMOGLOBIN: 13.4 g/dL (ref 13.0–17.0)
MCH: 29.1 pg (ref 26.0–34.0)
MCHC: 32.8 g/dL (ref 30.0–36.0)
MCV: 88.9 fL (ref 78.0–100.0)
Platelets: 142 10*3/uL — ABNORMAL LOW (ref 150–400)
RBC: 4.6 MIL/uL (ref 4.22–5.81)
RDW: 16.7 % — ABNORMAL HIGH (ref 11.5–15.5)
WBC: 8.5 10*3/uL (ref 4.0–10.5)

## 2017-01-22 LAB — BASIC METABOLIC PANEL
ANION GAP: 5 (ref 5–15)
BUN: 28 mg/dL — ABNORMAL HIGH (ref 6–20)
CHLORIDE: 106 mmol/L (ref 101–111)
CO2: 26 mmol/L (ref 22–32)
Calcium: 8.3 mg/dL — ABNORMAL LOW (ref 8.9–10.3)
Creatinine, Ser: 2.18 mg/dL — ABNORMAL HIGH (ref 0.61–1.24)
GFR calc non Af Amer: 29 mL/min — ABNORMAL LOW (ref >60)
GFR, EST AFRICAN AMERICAN: 34 mL/min — AB (ref >60)
GLUCOSE: 215 mg/dL — AB (ref 65–99)
POTASSIUM: 3.6 mmol/L (ref 3.5–5.1)
Sodium: 137 mmol/L (ref 135–145)

## 2017-01-22 LAB — GLUCOSE, CAPILLARY
Glucose-Capillary: 167 mg/dL — ABNORMAL HIGH (ref 65–99)
Glucose-Capillary: 190 mg/dL — ABNORMAL HIGH (ref 65–99)

## 2017-01-22 MED ORDER — CLOPIDOGREL BISULFATE 75 MG PO TABS: 75.0000 mg | ORAL_TABLET | Freq: Every day | ORAL | 0 refills | Status: DC

## 2017-01-22 MED ORDER — METOPROLOL TARTRATE 25 MG PO TABS: 12.5000 mg | ORAL_TABLET | Freq: Two times a day (BID) | ORAL | 0 refills | Status: DC

## 2017-01-22 NOTE — Progress Notes (Signed)
[facility] Progress Note    Assessment/ Plan:   1.  Chronic kidney disease Stage G3b/IV: creatinine at baseline.  There is an increased risk of CIN with cardiac cath in CKD which he and I have discussed.  He has received appropriate pre and post cath hydration.  We discussed today that the risk of CIN is greater with each contrast load.  He is aware of these risks.  He desires to go home today; we have discussed return precautions including decreased urine output, lack of appetite, confusion, nausea/ vomiting, or any other concerning symptom.  I have called [facility] and have arranged for labs on Monday.  (CIN can occur 48-72 hrs after contrast load).  His Cozaar has been held; will determine whether or not to restart based on outpatient labs.  2.  NSTEMI: on hep gtt, ASA, plavix, BB, atorvastatin; s/p DES to mid LAD (cath 6/20 and 6/21).  3.  DM II: per primary  4.  OSA: CPAP ordered.  Subjective:    Had PCI to mid-LAD with DES yesterday.  Cr 2.18 today.     Objective:   BP (!) 165/90 (BP Location: Left Arm)   Pulse 66   Temp 98 F (36.7 C) (Oral)   Resp (!) 22   Ht 6' (1.829 m)   Wt 135 kg (297 lb 9.9 oz)   SpO2 95%   BMI 40.36 kg/m   Intake/Output Summary (Last 24 hours) at 01/22/17 1302 Last data filed at 01/22/17 9323  Gross per 24 hour  Intake           151.67 ml  Output              625 ml  Net          -473.33 ml   Weight change: -4.7 kg (-10 lb 5.8 oz)  Physical Exam: GEN obese man, resting in bed, NAD HEENT EOMI, PERRL, MMM NECK thick no jvd PULM clear bilaterally CV RRR no m/r/g ABD soft protuberant nontender NABS,  EXT trace LE edema, some burnished appearing skin NEURO nonfocal  Imaging: No results found.  Labs: BMET  Recent Labs Lab 01/19/17 1359 01/20/17 0821 01/21/17 0742 01/22/17 0332  NA 135 136 137 137  K 3.8 3.8 3.7 3.6  CL 101 105 106 106  CO2 22 23 24 26   GLUCOSE 231* 184* 212* 215*  BUN 27* 25*  28* 28*  CREATININE 2.16* 2.20* 2.15* 2.18*  CALCIUM 9.3 8.9 8.6* 8.3*   CBC  Recent Labs Lab 01/19/17 1359 01/20/17 0308 01/21/17 0742 01/22/17 0332  WBC 11.6* 10.9* 10.4 8.5  HGB 15.3 14.7 14.0 13.4  HCT 44.3 43.2 41.5 40.9  MCV 86.2 87.4 87.9 88.9  PLT 138* 133* 145* 142*    Medications:    . aspirin EC  81 mg Oral Daily  . atorvastatin  40 mg Oral q1800  . clopidogrel  75 mg Oral Daily  . fluticasone  1 spray Each Nare Daily  . gabapentin  100 mg Oral QHS  . heparin  5,000 Units Subcutaneous Q8H  . insulin aspart  0-9 Units Subcutaneous TID WC  . insulin glargine  40 Units Subcutaneous QHS  . levothyroxine  300 mcg Oral QAC breakfast  . metoprolol tartrate  12.5 mg Oral BID  . omega-3 acid ethyl esters  1 capsule Oral Daily  . pneumococcal 23 valent vaccine  0.5 mL Intramuscular Tomorrow-1000  . senna  1 tablet Oral BID  . sodium chloride  flush  3 mL Intravenous Q12H  . sodium chloride flush  3 mL Intravenous Q12H  . sodium chloride flush  3 mL Intravenous Q12H  . sodium chloride flush  3 mL Intravenous Q12H      Madelon Lips, MD [facility] pgr [phone] 01/22/2017, 1:02 PM

## 2017-01-22 NOTE — Telephone Encounter (Signed)
-----   Message from Orinda Kenner sent at 01/22/2017  9:34 AM EDT ----- Regarding: TCM Patient to be d/c'd today.Marland Kitchen  Thanks, Coralyn Mark

## 2017-01-22 NOTE — Progress Notes (Signed)
Progress Note  Patient Name: [name] [name] Date of Encounter: 01/22/2017  Primary Cardiologist: New (Wants to follow in [place])  Subjective   Feeling well this morning. Wants to go home.   Inpatient Medications    Scheduled Meds: . aspirin EC  81 mg Oral Daily  . atorvastatin  40 mg Oral q1800  . clopidogrel  75 mg Oral Daily  . fluticasone  1 spray Each Nare Daily  . gabapentin  100 mg Oral QHS  . heparin  5,000 Units Subcutaneous Q8H  . insulin aspart  0-9 Units Subcutaneous TID WC  . insulin glargine  40 Units Subcutaneous QHS  . levothyroxine  300 mcg Oral QAC breakfast  . metoprolol tartrate  12.5 mg Oral BID  . omega-3 acid ethyl esters  1 capsule Oral Daily  . pneumococcal 23 valent vaccine  0.5 mL Intramuscular Tomorrow-1000  . senna  1 tablet Oral BID  . sodium chloride flush  3 mL Intravenous Q12H  . sodium chloride flush  3 mL Intravenous Q12H  . sodium chloride flush  3 mL Intravenous Q12H  . sodium chloride flush  3 mL Intravenous Q12H   Continuous Infusions: . sodium chloride    . sodium chloride    . sodium chloride     PRN Meds: sodium chloride, sodium chloride, sodium chloride, acetaminophen **OR** acetaminophen, albuterol, ondansetron **OR** ondansetron (ZOFRAN) IV, polyethylene glycol, sodium chloride flush, sodium chloride flush, sodium chloride flush, traZODone   Vital Signs    Vitals:   01/21/17 2306 01/22/17 0000 01/22/17 0400 01/22/17 0628  BP: 140/69 (!) 149/85 (!) 146/80 (!) 153/91  Pulse: 69 (!) 59 72 80  Resp: 20 14 (!) 22 (!) 24  Temp: 98.2 F (36.8 C)   98.3 F (36.8 C)  TempSrc: Oral   Oral  SpO2: 98% 98% 95% 94%  Weight:    297 lb 9.9 oz (135 kg)  Height:        Intake/Output Summary (Last 24 hours) at 01/22/17 0742 Last data filed at 01/22/17 0102  Gross per 24 hour  Intake              417 ml  Output              625 ml  Net             -208 ml   Filed Weights   01/20/17 0500 01/21/17 0500 01/22/17 0628  Weight:  (!) 301 lb 13 oz (136.9 kg) (!) 307 lb 15.7 oz (139.7 kg) 297 lb 9.9 oz (135 kg)    Telemetry    SR - Personally Reviewed  ECG    SR with LBBB, with PVCs - Personally Reviewed  Physical Exam   General: Obese W male appearing in no acute distress. Head: Normocephalic, atraumatic.  Neck: Supple without bruits, JVD. Lungs:  Resp regular and unlabored, CTA. Heart: RRR, S1, S2, no S3, S4, or murmur; no rub. Abdomen: Soft, non-tender, non-distended with normoactive bowel sounds. No hepatomegaly. No rebound/guarding. No obvious abdominal masses. Extremities: No clubbing, cyanosis, mild LE edema. Distal pedal pulses are 2+ bilaterally. Right Radial site stable without hematoma. Neuro: Alert and oriented X 3. Moves all extremities spontaneously. Psych: Normal affect.  Labs    Chemistry Recent Labs Lab 01/19/17 1406 01/20/17 0821 01/21/17 0742 01/22/17 0332  NA  --  136 137 137  K  --  3.8 3.7 3.6  CL  --  105 106 106  CO2  --  23  24 26  GLUCOSE  --  184* 212* 215*  BUN  --  25* 28* 28*  CREATININE  --  2.20* 2.15* 2.18*  CALCIUM  --  8.9 8.6* 8.3*  PROT 7.1  --   --   --   ALBUMIN 4.0  --   --   --   AST 109*  --   --   --   ALT 30  --   --   --   ALKPHOS 66  --   --   --   BILITOT 1.5*  --   --   --   GFRNONAA  --  29* 30* 29*  GFRAA  --  33* 34* 34*  ANIONGAP  --  8 7 5      Hematology Recent Labs Lab 01/20/17 0308 01/21/17 0742 01/22/17 0332  WBC 10.9* 10.4 8.5  RBC 4.94 4.72 4.60  HGB 14.7 14.0 13.4  HCT 43.2 41.5 40.9  MCV 87.4 87.9 88.9  MCH 29.8 29.7 29.1  MCHC 34.0 33.7 32.8  RDW 16.1* 16.4* 16.7*  PLT 133* 145* 142*    Cardiac Enzymes Recent Labs Lab 01/19/17 1406 01/19/17 1721  TROPONINI 22.33* 26.31*   No results for input(s): TROPIPOC in the last 168 hours.   BNPNo results for input(s): BNP, PROBNP in the last 168 hours.   DDimer No results for input(s): DDIMER in the last 168 hours.    Radiology    No results found.  Cardiac  Studies   LHC: 01/21/17  Conclusions: 1. Significant multivessel coronary artery disease (see yesterday's diagnostic catheterization for details). 2. Successful PCI to mid LAD with placement of a Synergy 3.0 x 32 mm drug-eluting stent with 0% residual stenosis and TIMI-3 flow.  Recommendations: 1. Dual antiplatelet therapy with aspirin and clopidogrel for at least 12 months. 2. Aggressive secondary prevention. 3. If chest pain recurs despite optimal medical therapy, PCI to ramus and/or distal LAD could be considered.  Nelva Bush, MD   Patient Profile     71 y.o. male with PMH significant for DM, CKD stage IV, non-hodgkin's lymphoma (s/p radiation in 2017 and chemotherapy finished in 2018), PVD, obesity, HTN, tobacco use, and hypothyroidism. Hepresented to [facility] ED with chest pain, and found to have elevated Trops. Sent to [facility] for further work up.   Assessment & Plan    1. CAD/Chest pain/NSTEMI: Underwent LHC with Dr. Irish Lack 01/20/17 with 100% stenosed first Mrg lesion, likely culprit for MI, with 80% mLAD lesion. Given elevated Cr, underwent staged intervention to mLAD with DESx1. Trop peaked at 26.31.  -- BB, statin, DAPT with ASA/plavix -- Cr stable post cath.   -- plans to work with cardiac rehab today. Will arrange follow up in the [place] office.   2. CKD: Cr stable at 2.18 post cath at baseline.   3. DM: SSI -- Hgb A1c 6.6  4. HTN: Borderline controlled -- continue BB  5. HL: on statin -- LDL 50  Signed, Reino Bellis, NP  01/22/2017, 7:42 AM    I have examined the patient and reviewed assessment and plan and discussed with patient.  Agree with above as stated.  No angina.  WIll walk with rehab.    Verapamil was stopped.  Resume losartan which he was on at home since renal function seems stable.  Can have BMet at next f/u appt.  Could also consider adding amlodipine at that time.   Larae Grooms

## 2017-01-22 NOTE — Progress Notes (Signed)
CARDIAC REHAB PHASE I   PRE:  Rate/Rhythm: 66 SR    BP: sitting 142/82    SaO2: 96 RA  MODE:  Ambulation: 120 ft   POST:  Rate/Rhythm: 85 SR    BP: sitting 175/98     SaO2: 95 RA  Pt with significant knee arthritis. Uses cane. Also has neuropathy. Walked short distance with cane with c/o knee pain, fatigued toward end.  BP elevated. Return to recliner. Ed completed with pt and wife. Discussed importance of Plavix/ASA, MI, stent, restrictions, diet (and weight loss), ex as tolerated (he is very limited), NTG and CRPII. Will refer to [place]. The hope is that recumbent exercise will be beneficial for him.  [place], ACSM 01/22/2017 11:50 AM

## 2017-01-22 NOTE — Telephone Encounter (Signed)
Patient contacted regarding discharge from [facility] on 01/21/17.  Patient understands to follow up with provider K lawrence NP on 01/29/17 at 230 pm at [facility]. Patient understands discharge instructions?  Patient understands medications and regiment?  Patient understands to bring all medications to this visit?    LM asking patient to call back and confirm apt and discharge instructions

## 2017-01-22 NOTE — Care Management Note (Addendum)
Case Management Note  Patient Details  Name: [name] [name] MRN: [id number] Date of Birth: [birth year]  Subjective/Objective:    Pt [facility] with CP [facility] - [facility]                Action/Plan:   PTA independent from home with girlfriend - uses cane when outside of the home due knee issues.  Pt has PCP and denies barriers to obtaining prescribed medications.   Expected Discharge Date:  01/22/17               Expected Discharge Plan:  Home/Self Care  In-House Referral:     Discharge planning Services  CM Consult  Post Acute Care Choice:  Durable Medical Equipment, Home Health Choice offered to:  Patient  DME Arranged:  3-N-1 (bariatric) DME Agency:  [place]:  PT [facility] Agency:  [place]  Status of Service:  Completed, signed off  If discussed at [place] of Stay Meetings, dates discussed:    Additional Comments: CM offered pt choice, chose [facility] for both HH and Dme - agency contacted and both referral accepted - agency informed that pt will discharge today Maryclare Labrador, RN 01/22/2017, 2:24 PM

## 2017-01-22 NOTE — Evaluation (Addendum)
Physical Therapy Evaluation Patient Details Name: [name] [name] MRN: [id number] DOB: 1946/05/18 Today's Date: 01/22/2017   History of Present Illness  Pt is a 71 yo male admitted through ED on 01/19/17 with generalized weakness, nausea and decreased appetite with vomiting. Troponin was 22 on admission. Pt was diagnosed with NSTEMI and underwent a coronary stent with via catheterization on 01/21/17. 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    Clinical Impression  Pt presents with the above diagnosis and below deficits for therapy evaluation. Prior to admission, pt lived with his girlfriend in a double-wide home and was completely independent with [place]. Pt requires Min guard for all mobility this session with no DOE but has antalgic gait due to bilateral knee OA. Pt will benefit from continued acute PT follow-up for ensure safe stair negotiation prior to discharge and will benefit from HHPT at discharge.     Follow Up Recommendations Home health PT    Equipment Recommendations  3in1 (PT);Other (comment) (bariatric)    Recommendations for Other Services       Precautions / Restrictions Precautions Precautions: Fall Restrictions Weight Bearing Restrictions: No      Mobility  Bed Mobility Overal bed mobility: Modified Independent             General bed mobility comments: sitting up in recliner when PT arrives  Transfers Overall transfer level: Needs assistance Equipment used: None Transfers: Sit to/from Stand Sit to Stand: Min guard         General transfer comment: MIn gaurd for safety from lower chair  Ambulation/Gait Ambulation/Gait assistance: Min guard Ambulation Distance (Feet): 75 Feet Assistive device: Straight cane Gait Pattern/deviations: Step-through pattern;Decreased step length - right;Decreased step length - left;Wide base of support;Antalgic Gait velocity: decreased Gait velocity interpretation: <1.8 ft/sec,  indicative of risk for recurrent falls General Gait Details: decreased step length bilaterally mild antalgic gait  Stairs            Wheelchair Mobility    Modified Rankin (Stroke Patients Only)       Balance Overall balance assessment: Needs assistance Sitting-balance support: No upper extremity supported;Feet supported Sitting balance-Leahy Scale: Good     Standing balance support: No upper extremity supported;Single extremity supported Standing balance-Leahy Scale: Fair                               Pertinent Vitals/Pain Pain Assessment: No/denies pain    Home Living Family/patient expects to be discharged to:: Private residence Living Arrangements: Spouse/significant other Available Help at Discharge: Family;Available 24 hours/day Type of Home: Mobile home Home Access: Stairs to enter Entrance Stairs-Rails: Right;Left;Can reach both Entrance Stairs-Number of Steps: 6 Home Layout: One level Home Equipment: Walker - 2 wheels;Cane - single point      Prior Function Level of Independence: Independent with assistive device(s)         Comments: uses [place] for mobility, GF assists with meal prep     Hand Dominance   Dominant Hand: Right    Extremity/Trunk Assessment   Upper Extremity Assessment Upper Extremity Assessment: RUE deficits/detail RUE Deficits / Details: no lifting due to recent catheterization through radial artery    Lower Extremity Assessment Lower Extremity Assessment: Generalized weakness (bilateral knee repalcements)    Cervical / Trunk Assessment Cervical / Trunk Assessment: Normal  Communication   Communication: No difficulties  Cognition Arousal/Alertness: Awake/alert Behavior During Therapy: WFL for tasks assessed/performed  Overall Cognitive Status: Within Functional Limits for tasks assessed                                        General Comments General comments (skin integrity, edema, etc.):  girlfriend present throughout session    Exercises     Assessment/Plan    PT Assessment Patient needs continued PT services  PT Problem List Decreased strength;Decreased activity tolerance;Decreased balance;Decreased mobility;Pain       PT Treatment Interventions DME instruction;Gait training;Stair training;Functional mobility training;Therapeutic activities;Therapeutic exercise;Balance training    PT Goals (Current goals can be found in the Care Plan section)  Acute Rehab PT Goals Patient Stated Goal: to get back home PT Goal Formulation: With patient Time For Goal Achievement: 01/29/17 Potential to Achieve Goals: Good    Frequency Min 3X/week   Barriers to discharge        Co-evaluation               AM-PAC PT "6 Clicks" Daily Activity  Outcome Measure Difficulty turning over in bed (including adjusting bedclothes, sheets and blankets)?: None Difficulty moving from lying on back to sitting on the side of the bed? : None Difficulty sitting down on and standing up from a chair with arms (e.g., wheelchair, bedside commode, etc,.)?: Total Help needed moving to and from a bed to chair (including a wheelchair)?: A Little Help needed walking in hospital room?: A Little Help needed climbing 3-5 steps with a railing? : A Little 6 Click Score: 18    End of Session Equipment Utilized During Treatment: Gait belt Activity Tolerance: Patient tolerated treatment well Patient left: in chair;with call bell/phone within reach;with family/visitor present Nurse Communication: Mobility status PT Visit Diagnosis: Muscle weakness (generalized) (M62.81);Difficulty in walking, not elsewhere classified (R26.2)    Time: 3254-9826 PT Time Calculation (min) (ACUTE ONLY): 18 min   Charges:   PT Evaluation $PT Eval Moderate Complexity: 1 Procedure     PT G Codes:        Scheryl Marten PT, DPT  [id number]   Jacqulyn Liner Sloan Leiter 01/22/2017, 3:55 PM

## 2017-01-22 NOTE — Discharge Summary (Addendum)
Discharge Summary  [name] [name] XBJ:[id number] DOB: 1946-03-21  PCP: [name], [place] date: 01/19/2017 Discharge date: 01/22/2017  Time spent: >47mins, more than 50% time spent on coordination of care  Recommendations for Outpatient Follow-up:  1. F/u with PMD  for hospital discharge follow up, repeat cbc/bmp at follow up 2. F/u with cardiology on 6/29 3. F/u with nephrology on 6/25 , he need to have bmp at nephrologist's office on Monday, hold cozaar for now 4. Home health PT arranged, detail please see Pt eval note.  Discharge Diagnoses:  Active Hospital Problems   Diagnosis Date Noted  . NSTEMI (non-ST elevated myocardial infarction) ([place]) 01/19/2017  . NSTEMI, initial episode of care ([place]) 01/19/2017  . Elevated troponin I level 07/24/2016  . 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[place]) 10/05/2014  . Chronic kidney disease Stage IV 07/23/2014  . Hypertension 07/23/2014  . Type 2 diabetes mellitus with stage 4 chronic kidney disease ([place]) 07/23/2014  . Morbid obesity ([place]) 07/23/2014    Resolved Hospital Problems   Diagnosis Date Noted Date Resolved  No resolved problems to display.    Discharge Condition: stable  Diet recommendation: heart healthy/carb modified  Filed Weights   01/20/17 0500 01/21/17 0500 01/22/17 0628  Weight: (!) 136.9 kg (301 lb 13 oz) (!) 139.7 kg (307 lb 15.7 oz) 135 kg (297 lb 9.9 oz)    History of present illness:  [name] [name]  is a 70 y.o. male, With past medical history relevant for hypertension, morbid obesity, diabetes, nicotine abuse and dyslipidemia who [facility] to [facility] hospital ) with vague complaints of fatigue, nausea and generalized weakness 24 hours. Patient apparently had some right-sided chest discomfort on 01/18/2017 but this has resolved. He also had nausea with emesis over the last 24 hours, emesis was without blood or bile. No leg pains no leg swelling no pleuritic symptoms. No fevers no chills no productive cough.  No dizziness, no increased shortness of breath or increased dyspnea on exertion. No headache or visual disturbance.  According to patient's significant other at bedside, patient had just not felt well for about 24 hours now  In ED workup reveals EKG with an old left bundle branch block without new acute findings, however patient's troponin is over 22. Again patient is chest pain-free at this time.   IV heparin drip was started in the ED, aspirin and metoprolol Lipitor and Plavix were ordered .  he'll be transferred  From [facility] ED to [facility] Cardiac stepdown unit after discussions with Dr. Kirk Ruths the on-call cardiologist  Cardiac cath on 6/20 done showed the following findings:  1st Mrg lesion, 100 %stenosed. This is the culprit for his recent MI.  Ramus lesion, 75 %stenosed. This is a relatively small vessel.  Mid LAD lesion, 80 %stenosed.  LV end diastolic pressure is moderately elevated.  There is no aortic valve stenosis.  Given elevated Cr, underwent staged intervention to mLAD with DESx1 on 6/21 . Trop peaked at 26.31.   Hospital Course:  Principal Problem:   NSTEMI (non-ST elevated myocardial infarction) ([place]) Active Problems:   Chronic kidney disease Stage IV   Morbid obesity ([facility])   Type 2 diabetes mellitus with stage 4 chronic kidney disease ([facility])   Hypertension   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[facility])   Elevated troponin I level   NSTEMI, initial episode of care [facility])   Principal Problem:  NSTEMI (non-ST elevated myocardial infarction) ([place]) - Stable on telemetry. 2-D echo shows decreased EF of 45% with  grade 2 diastolic dysfunction -MI from occluded obtuse marginal (100% stenosed first Mrg lesion).  Trop peaked at 26.31. - Given elevated Cr, underwent staged intervention to mLAD with DESx1 on 6/21  - he is started on DAPT  With asa and plavix, he is started on betablocker ( previously on verapamil which is discontinued), he is continued on  statin. -cardiology cleared patient to discharge home with close cardiology follow up on 6/29 , patient is also referred to cardiac rehab.  Acute systolic CHF Possibly ischemic cardiomyopathy.  Continue aspirin, added beta blocker and statin.  ARB held due to renal function, he is to follow with cardiology and nephrology.    Active Problems: Chronic kidney disease Stage 3 Renal function seems to be at baseline. .  Premedicated with prednisone and Benadryl for cardiac cath.  He received precath hydration  given high risk for contrast-induced nephropathy. Nephrology also consulted  Holding ARB held since admission, nephrology recommend continue hold cozaar, patient need to have repeat renal function at nephrologist office on  Monday , further instruction  Regarding cozaar per nephrology at follow up.  Type 2 diabetes mellitus with stage III chronic kidney disease (HCC)  A1c of 6.6.Diabetes seems well controlled,  Continue home meds tresiba    Essential hypertension Stable. Added low dose metoprolol.  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  Hypothyroidism Continue Synthroid.   Morbid obesity ([place]) Body mass index is 40.36 kg/m. Life style modification   Code Status : Full code  Family Communication  : Girlfriend at bedside  Disposition Plan  : Home with cardiology and nephrology clearance   Consults  :  Cardiology, nephrology  Procedures  :  2-D echo Left heart catheterization with stenting  staged on 6/20 and 6/21   DVT Prophylaxis  :  IV heparin   Discharge Exam: BP (!) 165/90 (BP Location: Left Arm)   Pulse 66   Temp 98 F (36.7 C) (Oral)   Resp (!) 22   Ht 6' (1.829 m)   Wt 135 kg (297 lb 9.9 oz)   SpO2 95%   BMI 40.36 kg/m   General: NAD, obese Cardiovascular: RRR Respiratory: CTABL  extremity: no edema  Discharge  Instructions You were cared for by a hospitalist during your hospital stay. If you have any questions about your discharge medications or the care you received while you were in the hospital after you are discharged, you can call the unit and asked to speak with the hospitalist on call if the hospitalist that took care of you is not available. Once you are discharged, your primary care physician will handle any further medical issues. Please note that NO REFILLS for any discharge medications will be authorized once you are discharged, as it is imperative that you return to your primary care physician (or establish a relationship with a primary care physician if you do not have one) for your aftercare needs so that they can reassess your need for medications and monitor your lab values.  Discharge Instructions    Amb Referral to Cardiac Rehabilitation    Complete by:  As directed    Diagnosis:   PTCA NSTEMI Coronary Stents     Diet - low sodium heart healthy    Complete by:  As directed    CARB MODIFIED   Face-to-face encounter (required for Medicare/Medicaid patients)    Complete by:  As directed  I Ludwin Flahive certify that this patient is under my care and that I, or a nurse practitioner or physician's assistant working with me, had a face-to-face encounter that meets the physician face-to-face encounter requirements with this patient on 01/22/2017. The encounter with the patient was in whole, or in part for the following medical condition(s) which is the primary reason for home health care (List medical condition): FTT   The encounter with the patient was in whole, or in part, for the following medical condition, which is the primary reason for home health care:  FTT   I certify that, based on my findings, the following services are medically necessary home health services:  Physical therapy   Reason for Medically Necessary Home Health Services:  Therapy- Personnel officer, Horticulturist, commercial   My clinical findings support the need for the above services:  Shortness of breath with activity   Further, I certify that my clinical findings support that this patient is homebound due to:  Shortness of Breath with activity   Home Health    Complete by:  As directed    To provide the following care/treatments:  PT   Increase activity slowly    Complete by:  As directed      Allergies as of 01/22/2017   No Known Allergies     Medication List    STOP taking these medications   losartan 25 MG tablet Commonly known as:  COZAAR   verapamil 240 MG CR tablet Commonly known as:  CALAN-SR     TAKE these medications   aspirin 81 MG tablet Take 81 mg by mouth daily.   clopidogrel 75 MG tablet Commonly known as:  PLAVIX Take 1 tablet (75 mg total) by mouth daily. Start taking on:  01/23/2017   fluticasone 50 MCG/ACT nasal spray Commonly known as:  FLONASE Place 1 spray into both nostrils daily.   gabapentin 100 MG capsule Commonly known as:  NEURONTIN Take 100 mg by mouth at bedtime.   insulin degludec 100 UNIT/ML Sopn FlexTouch Pen Commonly known as:  TRESIBA FLEXTOUCH Inject 0.5 mLs (50 Units total) into the skin daily at 10 pm. What changed:  how much to take   levothyroxine 200 MCG tablet Commonly known as:  SYNTHROID, LEVOTHROID Take 200 mcg by mouth daily before breakfast.   levothyroxine 100 MCG tablet Commonly known as:  SYNTHROID, LEVOTHROID Take 100 mcg by mouth daily before breakfast.   metoprolol tartrate 25 MG tablet Commonly known as:  LOPRESSOR Take 0.5 tablets (12.5 mg total) by mouth 2 (two) times daily.   montelukast 10 MG tablet Commonly known as:  SINGULAIR Take 10 mg by mouth.   NOVOLOG FLEXPEN 100 UNIT/ML FlexPen Generic drug:  insulin aspart Inject into the skin. Sliding scale As needed for blood sugar Notes to patient:  According to your sugar   omega-3 acid ethyl esters 1 g capsule Commonly known as:  LOVAZA Take 1 capsule  by mouth daily.   simvastatin 5 MG tablet Commonly known as:  ZOCOR Take 5 mg by mouth daily.   traZODone 50 MG tablet Commonly known as:  DESYREL TAKE 1 TO 2 TABLETS AT BEDTIME FOR INSOMNIA      No Known Allergies Follow-up Information    Lendon Colonel, NP Follow up on 01/29/2017.   Specialties:  Nurse Practitioner, Radiology, Cardiology Why:  at 2:30pm for your follow up appt.  Contact information: [address] [place] [address] [phone]  nephrology Follow up on 01/25/2017.   Why:  monitor kidney function, to discuss with nephrology about when to resume cozaar       Couillard, [name], PA-C Follow up in 2 week(s).   Specialty:  Physician Assistant Why:  hospital discharge follow up pmd to refer to have outpatient sleep study Contact information: [address] [place] [address] [phone]            The results of significant diagnostics from this hospitalization (including imaging, microbiology, ancillary and laboratory) are listed below for reference.    Significant Diagnostic Studies: Dg Abd Acute W/chest  Result Date: 01/19/2017 CLINICAL DATA:  71 y/o  M; chest pain and vomiting. EXAM: DG ABDOMEN ACUTE W/ 1V CHEST COMPARISON:  07/24/2016 chest radiograph.  08/14/2016 PET-CT. FINDINGS: There is no evidence of dilated bowel loops or free intraperitoneal air. Splenomegaly. Cholecystectomy clips. No focal consolidation, effusion, or pneumothorax of lungs. Stable cardiac silhouette. Multilevel degenerative changes of the spine. IMPRESSION: 1. No acute cardiopulmonary process identified. 2. Normal bowel gas pattern. 3. Splenomegaly. Electronically Signed   By: Kristine Garbe M.D.   On: 01/19/2017 15:16    Microbiology: Recent Results (from the past 240 hour(s))  MRSA PCR Screening     Status: None   Collection Time: 01/19/17 10:06 PM  Result Value Ref Range Status   MRSA by PCR NEGATIVE NEGATIVE Final    Comment:         The GeneXpert MRSA Assay (FDA approved for NASAL specimens only), is one component of a comprehensive MRSA colonization surveillance program. It is not intended to diagnose MRSA infection nor to guide or monitor treatment for MRSA infections.      Labs: Basic Metabolic Panel:  Recent Labs Lab 01/19/17 1359 01/20/17 0821 01/21/17 0742 01/22/17 0332  NA 135 136 137 137  K 3.8 3.8 3.7 3.6  CL 101 105 106 106  CO2 22 23 24 26   GLUCOSE 231* 184* 212* 215*  BUN 27* 25* 28* 28*  CREATININE 2.16* 2.20* 2.15* 2.18*  CALCIUM 9.3 8.9 8.6* 8.3*   Liver Function Tests:  Recent Labs Lab 01/19/17 1406  AST 109*  ALT 30  ALKPHOS 66  BILITOT 1.5*  PROT 7.1  ALBUMIN 4.0    Recent Labs Lab 01/19/17 1406  LIPASE 25   No results for input(s): AMMONIA in the last 168 hours. CBC:  Recent Labs Lab 01/19/17 1359 01/20/17 0308 01/21/17 0742 01/22/17 0332  WBC 11.6* 10.9* 10.4 8.5  HGB 15.3 14.7 14.0 13.4  HCT 44.3 43.2 41.5 40.9  MCV 86.2 87.4 87.9 88.9  PLT 138* 133* 145* 142*   Cardiac Enzymes:  Recent Labs Lab 01/19/17 1406 01/19/17 1721  TROPONINI 22.33* 26.31*   BNP: BNP (last 3 results) No results for input(s): BNP in the last 8760 hours.  ProBNP (last 3 results) No results for input(s): PROBNP in the last 8760 hours.  CBG:  Recent Labs Lab 01/21/17 1232 01/21/17 1648 01/21/17 2110 01/22/17 0743 01/22/17 1211  GLUCAP 207* 132* 194* 167* 190*       Signed:  Damond Borchers MD, PhD  [facility] 01/22/2017, 2:17 PM

## 2017-01-25 NOTE — Telephone Encounter (Signed)
LMTCB to confirm fu apt

## 2017-01-29 ENCOUNTER — Encounter: Payer: Self-pay | Admitting: Adult Health

## 2017-01-29 ENCOUNTER — Ambulatory Visit (INDEPENDENT_AMBULATORY_CARE_PROVIDER_SITE_OTHER): Payer: Medicare HMO | Admitting: Adult Health

## 2017-01-29 VITALS — BP 138/78 | HR 62 | Ht 72.0 in | Wt 309.0 lb

## 2017-01-29 DIAGNOSIS — I2510 Atherosclerotic heart disease of native coronary artery without angina pectoris: Secondary | ICD-10-CM | POA: Diagnosis not present

## 2017-01-29 DIAGNOSIS — E038 Other specified hypothyroidism: Secondary | ICD-10-CM | POA: Diagnosis not present

## 2017-01-29 DIAGNOSIS — E7800 Pure hypercholesterolemia, unspecified: Secondary | ICD-10-CM | POA: Diagnosis not present

## 2017-01-29 MED ORDER — HYDROCORTISONE 2.5 % EX CREA: TOPICAL_CREAM | Freq: Every day | CUTANEOUS | 0 refills | Status: DC | PRN

## 2017-01-29 NOTE — Patient Instructions (Signed)
Your physician recommends that you schedule a follow-up appointment in: 3 Months  Your physician recommends that you continue on your current medications as directed. Please refer to the Current Medication list given to you today.  Start Hydrocortisone cream 2.5 % at cath site as needed.   If you need a refill on your cardiac medications before your next appointment, please call your pharmacy.  Thank you for choosing [facility]!

## 2017-01-29 NOTE — Progress Notes (Signed)
Cardiology Office Note   Date:  01/29/2017   ID:  Avi Kerschner, DOB 1946/01/17, MRN [id number]  PCP:  [name], [place]: [facility]  Chief Complaint  Patient presents with  . Coronary Artery Disease  . Hospitalization Follow-up      History of Present Illness: [name] [name] is a 71 y.o. male who presents for posthospitalization follow-up after admission for chest pain with elevated troponin on 01/19/2017. Other history includes non-Hodgkin's lymphoma status post radiation and chemotherapy finished in 2018, diabetes, chronic kidney disease stage V, hypertension, peripheral vascular disease, ongoing tobacco abuse, hypothyroidism and obesity.  The patient had cardiac catheterization on 01/20/2017: Conclusion     1st Mrg lesion, 100 %stenosed. This is the culprit for his recent MI.  Ramus lesion, 75 %stenosed. This is a relatively small vessel.  Mid LAD lesion, 80 %stenosed.  LV end diastolic pressure is moderately elevated.  There is no aortic valve stenosis.   Had subsequent PCI of the LAD on 01/21/2017: Conclusion   Conclusions: 1. Significant multivessel coronary artery disease (see yesterday's diagnostic catheterization for details). 2. Successful PCI to mid LAD with placement of a Synergy 3.0 x 32 mm drug-eluting stent with 0% residual stenosis and TIMI-3 flow.  Recommendations: 1. Dual antiplatelet therapy with aspirin and clopidogrel for at least 12 months. 2. Aggressive secondary prevention. 3. If chest pain recurs despite optimal medical therapy, PCI to ramus and/or distal LAD could be considered.   The patient was referred to cardiac rehabilitation on discharge, patient was continued on aspirin and Plavix, started on beta blocker and continue on statin therapy. No ARB or Ace was given due to renal function abnormality. Patient was to follow-up with nephrologist.  Echocardiogram: 01/20/2017  - Left ventricle: Wall thickness was  increased in a pattern of   moderate LVH. Systolic function was mildly reduced. The estimated   ejection fraction was in the range of 45% to 50%. Features are   consistent with a pseudonormal left ventricular filling pattern,   with concomitant abnormal relaxation and increased filling   pressure (grade 2 diastolic dysfunction). - Aortic valve: There was mild regurgitation.   He comes today not feeling much better. He continues overall fatigue. Has had some sweating over night. Denies chest pain. He is working with a PT for strengthening at home.   Past Medical History:  Diagnosis Date  . Cancer (HCC)    low grade non hodgkin's lymphoma  . Chronic kidney disease    stage III kidney disease   . Diabetes mellitus   . Hypothyroidism   . Lymphoma ([place])   . Peripheral vascular disease (HCC)    neuropathy in toes   . Pneumonia    hx of   . S/P biopsy   . Shortness of breath    due ot pain in knees   . Sleep apnea    sleep study 3/29 13 ? location     Past Surgical History:  Procedure Laterality Date  . CHOLECYSTECTOMY  1992  . COLONOSCOPY N/A 09/03/2014   SLF:six colon polyps removed/small internal hemorrhoids  . CORONARY STENT INTERVENTION N/A 01/21/2017   Procedure: Coronary Stent Intervention;  Surgeon: Nelva Bush, MD;  Location: [facility];  Service: Cardiovascular;  Laterality: N/A;  . ESOPHAGOGASTRODUODENOSCOPY N/A 09/03/2014   SLF: mild gastritis/few gastric polyps  . LEFT HEART CATH AND CORONARY ANGIOGRAPHY N/A 01/20/2017   Procedure: Left Heart Cath and Coronary Angiography;  Surgeon: Jettie Booze, MD;  Location: [facility]  INVASIVE [facility];  Service: Cardiovascular;  Laterality: N/A;  . TOTAL KNEE ARTHROPLASTY  11/10/2011   Procedure: TOTAL KNEE ARTHROPLASTY;  Surgeon: Mauri Pole, MD;  Location: [facility];  Service: Orthopedics;  Laterality: Right;     Current Outpatient Prescriptions  Medication Sig Dispense Refill  . aspirin 81 MG tablet Take 81 mg by  mouth daily.    . clopidogrel (PLAVIX) 75 MG tablet Take 1 tablet (75 mg total) by mouth daily. 30 tablet 0  . fluticasone (FLONASE) 50 MCG/ACT nasal spray Place 1 spray into both nostrils daily.    Marland Kitchen gabapentin (NEURONTIN) 100 MG capsule Take 100 mg by mouth at bedtime.    . insulin aspart (NOVOLOG FLEXPEN) 100 UNIT/ML FlexPen Inject into the skin. Sliding scale As needed for blood sugar    . insulin degludec (TRESIBA FLEXTOUCH) 100 UNIT/ML SOPN FlexTouch Pen Inject 0.5 mLs (50 Units total) into the skin daily at 10 pm. (Patient taking differently: Inject 60 Units into the skin daily at 10 pm. )    . levothyroxine (SYNTHROID, LEVOTHROID) 100 MCG tablet Take 100 mcg by mouth daily before breakfast.    . levothyroxine (SYNTHROID, LEVOTHROID) 200 MCG tablet Take 200 mcg by mouth daily before breakfast.    . metoprolol tartrate (LOPRESSOR) 25 MG tablet Take 0.5 tablets (12.5 mg total) by mouth 2 (two) times daily. 60 tablet 0  . montelukast (SINGULAIR) 10 MG tablet Take 10 mg by mouth.    . omega-3 acid ethyl esters (LOVAZA) 1 g capsule Take 1 capsule by mouth daily.     . simvastatin (ZOCOR) 5 MG tablet Take 5 mg by mouth daily.    . traZODone (DESYREL) 50 MG tablet TAKE 1 TO 2 TABLETS AT BEDTIME FOR INSOMNIA     No current facility-administered medications for this visit.     Allergies:   Patient has no known allergies.    Social History:  The patient  reports that he quit smoking about 9 years ago. His smoking use included Cigarettes. He has a 30.00 pack-year smoking history. He has never used smokeless tobacco. He reports that he does not drink alcohol or use drugs.   Family History:  The patient's family history includes Cancer in his father, maternal uncle, mother, and paternal uncle.    ROS: All other systems are reviewed and negative. Unless otherwise mentioned in H&P    PHYSICAL EXAM: VS:  BP 138/78   Pulse 62   Ht 6' (1.829 m)   Wt (!) 309 lb (140.2 kg)   SpO2 94%   BMI 41.91  kg/m  , BMI Body mass index is 41.91 kg/m. GEN: Well nourished, well developed, in no acute distress  HEENT: normal  Neck: no JVD, carotid bruits, or masses Cardiac: RRR; no murmurs, rubs, or gallops,no edema  Respiratory:  clear to auscultation bilaterally, normal work of breathing GI: soft, nontender, nondistended, + BS Obese.  MS: no deformity or atrophy Some erythema around the catheter insertion site, with some itching. No bleeding or signs of infection.  Skin: warm and dry, no rash. Venous stasis skin changes.  Neuro:  Strength and sensation are intact Psych: euthymic mood, full affect   Recent Labs: 01/19/2017: ALT 30; TSH 1.541 01/22/2017: BUN 28; Creatinine, Ser 2.18; Hemoglobin 13.4; Platelets 142; Potassium 3.6; Sodium 137    Lipid Panel    Component Value Date/Time   CHOL 116 01/20/2017 1130   TRIG 174 (H) 01/20/2017 1130   HDL 31 (L) 01/20/2017 1130  CHOLHDL 3.7 01/20/2017 1130   VLDL 35 01/20/2017 1130   LDLCALC 50 01/20/2017 1130      Wt Readings from Last 3 Encounters:  01/29/17 (!) 309 lb (140.2 kg)  01/22/17 297 lb 9.9 oz (135 kg)  12/08/16 (!) 304 lb 6.4 oz (138.1 kg)     ASSESSMENT AND PLAN:  1.  CAD: Recent hospitalization for dyspnea, positive cardiac enzymes, cardiac catheterization with 100% stenosed first marginal lesion, 80% mid LAD requiring PCI, and 75% stenosed ramus which was a small vessel. He did have mild systolic dysfunction. The patient is tolerating dual antiplatelet therapy, beta blocker, and statin therapy. He states he is not feeling much stronger yet.  2 Hypothyroidism:. I have reviewed the patient's medications. He is on levothyroxine 300 mg daily. His wife is been preparing his medications, and is not given him but 100 mcg daily, and is forgotten to place the second 200 mcg tablet in his medication box. This may be part of the reason he still feeling badly and having some of his nighttime symptoms. She will start giving him his full  dose today, I will follow up with PCP for ongoing management.  3. Diabetes: Followed by primary care.  4. Skin irritation: Patient had redness and itching around the catheter insertion site of the right wrist. I'm going to provide him with hydrocortisone cream to place for symptomatic relief. There is no bleeding or signs of infection currently. If the site but is look worse, infected, hot, or more irritated, he is to seek medical attention. They verbalize understanding.   Current medicines are reviewed at length with the patient today.  He will need to be established with primary cardiologist in [place] on follow-up visit.   Labs/ tests ordered today include:  Phill Myron. [place] Pugh, ANP, AACC   01/29/2017 3:01 PM    [facility] [address] [place], [place] [address] Phone: [phone]; Fax: [phone]

## 2017-02-02 ENCOUNTER — Encounter: Payer: Self-pay | Admitting: Cardiology

## 2017-02-04 ENCOUNTER — Telehealth (HOSPITAL_COMMUNITY): Payer: Self-pay

## 2017-02-04 ENCOUNTER — Other Ambulatory Visit (HOSPITAL_COMMUNITY): Payer: Self-pay | Admitting: Adult Health

## 2017-02-04 ENCOUNTER — Other Ambulatory Visit (HOSPITAL_COMMUNITY): Payer: Self-pay

## 2017-02-04 ENCOUNTER — Encounter (HOSPITAL_COMMUNITY): Payer: Medicare HMO | Attending: Oncology

## 2017-02-04 DIAGNOSIS — N183 Chronic kidney disease, stage 3 unspecified: Secondary | ICD-10-CM

## 2017-02-04 DIAGNOSIS — C8580 Other specified types of non-Hodgkin lymphoma, unspecified site: Secondary | ICD-10-CM

## 2017-02-04 LAB — BASIC METABOLIC PANEL
ANION GAP: 9 (ref 5–15)
BUN: 26 mg/dL — ABNORMAL HIGH (ref 6–20)
CALCIUM: 9.2 mg/dL (ref 8.9–10.3)
CO2: 28 mmol/L (ref 22–32)
Chloride: 100 mmol/L — ABNORMAL LOW (ref 101–111)
Creatinine, Ser: 2.46 mg/dL — ABNORMAL HIGH (ref 0.61–1.24)
GFR calc non Af Amer: 25 mL/min — ABNORMAL LOW (ref >60)
GFR, EST AFRICAN AMERICAN: 29 mL/min — AB (ref >60)
GLUCOSE: 204 mg/dL — AB (ref 65–99)
POTASSIUM: 3.5 mmol/L (ref 3.5–5.1)
Sodium: 137 mmol/L (ref 135–145)

## 2017-02-04 NOTE — Telephone Encounter (Signed)
Radiology called stating patient GFR and creatinene were elevated beyond the point of being able to give contrast for the CT scans patient has scheduled tomorrow (chest/abd/pelvis). Radiology requested a new order for scans without contrast, have the MD talk with radiologist , or possible hydrate patient prior to scans. Reviewed with NP. She ordered BMP because the last one was performed 3 weeks ago. She also noted patient has CKD and hydration will not help much. Called patient and informed him of problem and plan. He is to come in today for BMP at 1310. Patient verbalized understanding.

## 2017-02-05 ENCOUNTER — Other Ambulatory Visit (HOSPITAL_COMMUNITY): Payer: Medicare HMO

## 2017-02-05 ENCOUNTER — Ambulatory Visit (HOSPITAL_COMMUNITY)
Admission: RE | Admit: 2017-02-05 | Discharge: 2017-02-05 | Disposition: A | Discharge status: Home / Self Care | Payer: Medicare HMO | Source: Ambulatory Visit | Attending: Oncology | Admitting: Oncology

## 2017-02-05 DIAGNOSIS — K760 Fatty (change of) liver, not elsewhere classified: Secondary | ICD-10-CM | POA: Diagnosis not present

## 2017-02-05 DIAGNOSIS — R161 Splenomegaly, not elsewhere classified: Secondary | ICD-10-CM | POA: Insufficient documentation

## 2017-02-05 DIAGNOSIS — I700 Atherosclerosis of aorta: Secondary | ICD-10-CM | POA: Diagnosis not present

## 2017-02-05 DIAGNOSIS — I313 Pericardial effusion (noninflammatory): Secondary | ICD-10-CM | POA: Diagnosis not present

## 2017-02-05 DIAGNOSIS — I2510 Atherosclerotic heart disease of native coronary artery without angina pectoris: Secondary | ICD-10-CM | POA: Insufficient documentation

## 2017-02-05 DIAGNOSIS — C8580 Other specified types of non-Hodgkin lymphoma, unspecified site: Secondary | ICD-10-CM | POA: Diagnosis not present

## 2017-02-08 ENCOUNTER — Encounter (HOSPITAL_COMMUNITY): Payer: Self-pay

## 2017-02-08 ENCOUNTER — Encounter (HOSPITAL_BASED_OUTPATIENT_CLINIC_OR_DEPARTMENT_OTHER): Payer: Medicare HMO | Admitting: Oncology

## 2017-02-08 VITALS — BP 178/93 | HR 58 | Resp 20 | Ht 72.0 in | Wt 313.5 lb

## 2017-02-08 DIAGNOSIS — D696 Thrombocytopenia, unspecified: Secondary | ICD-10-CM

## 2017-02-08 DIAGNOSIS — C8580 Other specified types of non-Hodgkin lymphoma, unspecified site: Secondary | ICD-10-CM

## 2017-02-08 DIAGNOSIS — N189 Chronic kidney disease, unspecified: Secondary | ICD-10-CM | POA: Diagnosis not present

## 2017-02-08 DIAGNOSIS — E1165 Type 2 diabetes mellitus with hyperglycemia: Secondary | ICD-10-CM | POA: Diagnosis not present

## 2017-02-08 NOTE — Progress Notes (Signed)
[place] PROGRESS NOTE  Patient Care Team: [name] as PCP - General (Family Medicine)  CHIEF COMPLAINTS:  Marginal Zone Lymphoma EGD 09/03/2014 negative for intestinal metaplasia, dysplasia or malignancy H pylori negative Colonoscopy on 09/03/2014 with 6 polyps (tubular adenomas), redundant left colon  Marginal zone lymphoma   Staging form: Lymphoid Neoplasms, AJCC 6th Edition     Clinical stage from 11/07/2014: Stage II - Unsigned    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[place])   07/23/2014 Imaging    CT C/A/P with soft tissue mass in pelvis, LN adjacent to distal esophagus, upper abdomen a partially calcified soft tissue mass      09/27/2014 Initial Biopsy    CT guided biopsy      10/17/2014 PET scan    Hypermetabolic mass left adjacent to the bladder. The mass is mild to moderate in metabolic activity consistent with low-grade lymphoma. Mild left external iliac and common iliac metabolic adenopathy.Single retroperitoneal node anterior to L renal vein      10/31/2014 - 11/21/2014 Chemotherapy    Single Agent Rituxan weekly x 4      01/21/2015 -  Antibody Plan         09/05/2015 - 09/23/2015 Radiation Therapy    Palliative XRT to palvic mass adjacent to bladder, 24 Gy in 12 fractions by Dr. Tammi Klippel      01/27/2016 PET scan    Hypermetabolic anterior left pelvic mass abutting the left superior bladder wall has decreased in size but mildly increased in metabolism, consistent with mild metabolic progression.      08/14/2016 PET scan    1. The mass along the left anterior urinary bladder is reduced in size and reduced in activity compared to the prior exam. The 2. Sub xiphoid os ossific structure with adjacent faint soft tissue density, the ossific structure has similar metabolic activity to other normal bony structures. 3. Mildly enlarged right subcarinal lymph node is not hypermetabolic and is below mediastinal activity. 4. Splenomegaly, without  splenic hypermetabolic activity.      02/05/2017 Imaging    CT C/A/P: IMPRESSION: 1. Mild mixed changes. Soft tissue mass abutting the left superior bladder wall is mildly decreased in size. Subxiphoid mass is stable. Mild left inguinal lymphadenopathy is stable. Subcarinal and AP window lymphadenopathy is mildly increased. 2. Stable mild splenomegaly. 3. Stable trace pericardial effusion/thickening. 4. Aortic atherosclerosis. Stable 4.0 cm ectatic ascending thoracic aorta. Recommend annual imaging followup by CTA or MRA. This recommendation follows 2010 ACCF/AHA/AATS/ACR/ASA/SCA/SCAI/SIR/STS/SVM Guidelines for the Diagnosis and Management of Patients with Thoracic Aortic Disease. Circulation. 2010; 121: H962-I297. 5. Two-vessel coronary atherosclerosis. 6. Mild diffuse hepatic steatosis.        HISTORY OF PRESENTING ILLNESS:  [name] [name] 71 y.o. male is here because of Stage II marginal zone lymphoma.  He was admitted to [facility] in December 2015 with abdominal pain. The pain was described as severe. He also reported episodes of vomiting. At presentation he had elevated liver function tests and bilirubin, CT scan of the abdomen and pelvis showed a possible bladder mass abutting the colon.He underwent a colonoscopy on 09/03/2014 with Dr. Oneida Alar. No mass was noted on exam. 6 polyps were removed all without dysplasia on final pathology, and a redundant left colon was noted. He ultimately underwent a CT-guided biopsy of the pelvic mass on February 25, with final pathology consistent with a low-grade non-Hodgkin's lymphoma, marginal zone type.  Mr. Longton returns to the [place] alone today. He goes by  the name of "[name] [name]."  Patient presents today for continued follow-up for his marginal zone lymphoma. Since his last visit he was admitted to the [facility] from 01/19/17 through 01/22/17 for chest pressure and was found to have elevated troponin of over 22. He never had any chest  pain. He had a cardiac cath done which demonstrated 1st Mrg lesion, 100 %stenosed which was thought to be the culprit for his recent MI. He underwent a cardiac cath to mLAD with a DESx1 on 6/21. He states that he has not been having any chest pain. He does state he experiences dyspnea with exertion. He has been doing home physical therapy. Otherwise he denies having any drenching night sweats, unexplained weight loss, fevers or chills. His appetite is good. He denies feeling any bulky lymphadenopathy.     MEDICAL HISTORY:  Past Medical History:  Diagnosis Date  . Cancer (HCC)    low grade non hodgkin's lymphoma  . Chronic kidney disease    stage III kidney disease   . Diabetes mellitus   . Hypothyroidism   . Lymphoma ([place])   . Peripheral vascular disease (HCC)    neuropathy in toes   . Pneumonia    hx of   . S/P biopsy   . Shortness of breath    due ot pain in knees   . Sleep apnea    sleep study 3/29 13 ? location     SURGICAL HISTORY: Past Surgical History:  Procedure Laterality Date  . CHOLECYSTECTOMY  1992  . COLONOSCOPY N/A 09/03/2014   SLF:six colon polyps removed/small internal hemorrhoids  . CORONARY STENT INTERVENTION N/A 01/21/2017   Procedure: Coronary Stent Intervention;  Surgeon: Nelva Bush, MD;  Location: [place] [facility];  Service: Cardiovascular;  Laterality: N/A;  . ESOPHAGOGASTRODUODENOSCOPY N/A 09/03/2014   SLF: mild gastritis/few gastric polyps  . LEFT HEART CATH AND CORONARY ANGIOGRAPHY N/A 01/20/2017   Procedure: Left Heart Cath and Coronary Angiography;  Surgeon: Jettie Booze, MD;  Location: [place] [facility];  Service: Cardiovascular;  Laterality: N/A;  . TOTAL KNEE ARTHROPLASTY  11/10/2011   Procedure: TOTAL KNEE ARTHROPLASTY;  Surgeon: Mauri Pole, MD;  Location: [facility];  Service: Orthopedics;  Laterality: Right;    SOCIAL HISTORY: Social History   Social History  . Marital status: Divorced    Spouse name: N/A  . Number of  children: N/A  . Years of education: N/A   Occupational History  . Not on file.   Social History Main Topics  . Smoking status: Former Smoker    Packs/day: 1.00    Years: 30.00    Types: Cigarettes    Quit date: 08/04/2007  . Smokeless tobacco: Never Used  . Alcohol use No  . Drug use: No  . Sexual activity: Yes   Other Topics Concern  . Not on file   Social History Narrative  . No narrative on file    FAMILY HISTORY: Family History  Problem Relation Age of Onset  . Cancer Mother        breast and lung  . Cancer Father        bladder  . Cancer Maternal Uncle        prostate  . Cancer Paternal Uncle        esophagus  . Colon cancer Neg Hx    indicated that the status of his mother is unknown. He indicated that the status of his father is unknown. He indicated that the status  of his maternal uncle is unknown. He indicated that the status of his paternal uncle is unknown. He indicated that the status of his neg hx is unknown.    ALLERGIES:  has No Known Allergies.  MEDICATIONS:  Current Outpatient Prescriptions  Medication Sig Dispense Refill  . aspirin 81 MG tablet Take 81 mg by mouth daily.    . calcipotriene (DOVONOX) 0.005 % cream     . clopidogrel (PLAVIX) 75 MG tablet Take 1 tablet (75 mg total) by mouth daily. 30 tablet 0  . fluticasone (FLONASE) 50 MCG/ACT nasal spray Place 1 spray into both nostrils daily.    Marland Kitchen gabapentin (NEURONTIN) 100 MG capsule Take 100 mg by mouth at bedtime.    . hydrocortisone 2.5 % cream Apply topically daily as needed. To Cath Site 30 g 0  . insulin aspart (NOVOLOG FLEXPEN) 100 UNIT/ML FlexPen Inject into the skin. Sliding scale As needed for blood sugar    . insulin degludec (TRESIBA FLEXTOUCH) 100 UNIT/ML SOPN FlexTouch Pen Inject 0.5 mLs (50 Units total) into the skin daily at 10 pm. (Patient taking differently: Inject 60 Units into the skin daily at 10 pm. )    . levothyroxine (SYNTHROID, LEVOTHROID) 100 MCG tablet Take 100 mcg by  mouth daily before breakfast.    . levothyroxine (SYNTHROID, LEVOTHROID) 200 MCG tablet Take 200 mcg by mouth daily before breakfast.    . losartan (COZAAR) 100 MG tablet Take 100 mg by mouth.    . metoprolol tartrate (LOPRESSOR) 25 MG tablet Take 0.5 tablets (12.5 mg total) by mouth 2 (two) times daily. 60 tablet 0  . montelukast (SINGULAIR) 10 MG tablet Take 10 mg by mouth.    . omega-3 acid ethyl esters (LOVAZA) 1 g capsule Take 1 capsule by mouth daily.     . simvastatin (ZOCOR) 5 MG tablet Take 5 mg by mouth daily.    . traZODone (DESYREL) 50 MG tablet TAKE 1 TO 2 TABLETS AT BEDTIME FOR INSOMNIA    . verapamil (CALAN-SR) 240 MG CR tablet      No current facility-administered medications for this visit.     Review of Systems  Constitutional: Positive for malaise/fatigue. Negative for chills and weight loss.       Appetite is normal  HENT: Negative.   Eyes: Negative.   Respiratory: Negative.  Negative for shortness of breath.   Cardiovascular: Negative.  Negative for chest pain.  Gastrointestinal: Negative.  Negative for abdominal pain.  Genitourinary: Negative.  Negative for frequency and urgency.  Musculoskeletal: Negative.        Feet and knees hurt when walking on them.   Skin: Negative.   Neurological:       Neuropathy in feet  Endo/Heme/Allergies: Negative.   Psychiatric/Behavioral: Negative.   All other systems reviewed and are negative.    PHYSICAL EXAMINATION: ECOG PERFORMANCE STATUS: 1 - Symptomatic but completely ambulatory   Physical Exam  Constitutional: He is oriented to person, place, and time and well-developed, well-nourished, and in no distress.  HENT:  Head: Normocephalic and atraumatic.  Mouth/Throat: No oropharyngeal exudate.  Eyes: Conjunctivae and EOM are normal. Pupils are equal, round, and reactive to light. No scleral icterus.  Neck: Normal range of motion. Neck supple.  Cardiovascular: Normal rate, regular rhythm and normal heart sounds.     Pulmonary/Chest: Effort normal and breath sounds normal. No respiratory distress.  Abdominal: Soft. Bowel sounds are normal. He exhibits no distension and no mass. There is no tenderness. There is  no rebound and no guarding.  Musculoskeletal: Normal range of motion.  Chronic LE skin venous stasis changes  Lymphadenopathy:    He has no cervical adenopathy.  Neurological: He is alert and oriented to person, place, and time. Gait normal.  Skin: Skin is warm and dry.  Psychiatric: Mood, memory, affect and judgment normal.  Nursing note and vitals reviewed.   LABORATORY DATA:  I have reviewed the data as listed  Results for AMAD, MAU (MRN [id number]) as of 02/08/2017 10:12  Ref. Range 02/04/2017 12:56  Sodium Latest Ref Range: 135 - 145 mmol/L 137  Potassium Latest Ref Range: 3.5 - 5.1 mmol/L 3.5  Chloride Latest Ref Range: 101 - 111 mmol/L 100 (L)  CO2 Latest Ref Range: 22 - 32 mmol/L 28  Glucose Latest Ref Range: 65 - 99 mg/dL 204 (H)  BUN Latest Ref Range: 6 - 20 mg/dL 26 (H)  Creatinine Latest Ref Range: 0.61 - 1.24 mg/dL 2.46 (H)  Calcium Latest Ref Range: 8.9 - 10.3 mg/dL 9.2  Anion gap Latest Ref Range: 5 - 15  9  GFR, Est African American Latest Ref Range: >60 mL/min 29 (L)  GFR, Est Non African American Latest Ref Range: >60 mL/min 25 (L)    PATHOLOGY:  Soft tissue mass, biopsy, adjacent to urinary bladder - ATYPICAL LYMPHOID PROLIFERATION CONSISTENT WITH NON-HODGKIN'S B-CELL LYMPHOMA. - SEE ONCOLOGY TABLE. Histologic type: Non-Hodgkin's lymphoma, see comment. Grade (if applicable): Low grade. Flow cytometry: No tissue is available for analysis since the specimen was received in formalin. Immunohistochemical stains: BCL-6, CD3, CD5, CD10, CD20, CD21, CD34, CD43, CD79a, CD138, Cyclin D-1, kappa, lambda, TdT, and Ki-67 with appropriate controls. Touch preps/imprints: Not performed. Comments: The sections show needle core biopsy of soft tissue displaying a very dense and  relatively monomorphic infiltrate of small lymphoid cells with high nuclear cytoplasmic ratio, round to irregular nuclei, dense chromatin, and small to inconspicuous nucleoli. The appearance is diffuse with lack of obvious follicular structures or proliferation centers. No necrosis or conspicuous mitosis is identified. To further evaluate this process, immunohistochemical stains were performed and show that the overwhelming majority of lymphocytes consist of B-cells, as highlighted with CD20 and CD79a. B-lymphocytes show no significant staining with BCL-6, CD10, CD5, Cyclin D-1, CD34, or TdT. CD21 highlights scattered small foci of dendritic networks throughout the core biopsies. CD138 highlights a minor plasma cell component, which consist of scattered cells and variably sized but predominantly small clusters. Kappa and lambda stains failed to show any significant staining and are considered non-contributory. Ki-67 shows very low expression (less than 5%). There is an admixed minor T-cell component present as seen with CD3, CD5, and CD43. The overall features are consistent with low grade non-Hodgkin's B-cell lymphoma and the overall phenotypic features favor marginal zone type. Clinical correlation is strongly recommended. (BNS:ds 10/01/14)   RADIOGRAPHIC STUDIES: I have personally reviewed the radiological images as listed and agreed with the findings in the report. Study Result   CLINICAL DATA:  Subsequent treatment strategy for mantle cell non-Hodgkin' s lymphoma. Pelvic mass.  EXAM: NUCLEAR MEDICINE PET SKULL BASE TO THIGH  TECHNIQUE: 16.2 mCi F-18 FDG was injected intravenously. Full-ring PET imaging was performed from the skull base to thigh after the radiotracer. CT data was obtained and used for attenuation correction and anatomic localization.  FASTING BLOOD GLUCOSE:  Value: 151 mg/dl  COMPARISON:  Multiple exams, including 01/27/2016  FINDINGS: NECK  No  hypermetabolic lymph nodes in the neck.  CHEST  Stable tiny punctate 3  mm right upper lobe pulmonary nodule, no associated measurable hypermetabolic adenopathy. 1.7 cm right subcarinal node, previously the same by my measurements, maximum SUV 3.7, less than background mediastinal blood pool activity of 4.5.  Coronary, aortic arch, and branch vessel atherosclerotic vascular disease. Mild cardiomegaly.  ABDOMEN/PELVIS  Sub xiphoid ossific structure with adjacent soft tissue density not changed from prior, maximum SUV 4.3, which is similar to other bony structures.  Elongated 0.9 cm in short axis lymph node above the left renal vein, not hypermetabolic.  The spleen measures 18.2 by 6.1 by 15.0 cm (volume = 870 cm^3) but is not hypermetabolic.  Aortoiliac atherosclerotic vascular disease.  Mass along the left anterior upper urinary bladder wall observed, mildly reduced size subjectively compared to prior, maximum SUV approximately 4.8 (formerly 6.3).  Trace pneumobilia, less than previous.  SKELETON  No focal hypermetabolic activity to suggest skeletal metastasis.  IMPRESSION: 1. The mass along the left anterior urinary bladder is reduced in size and reduced in activity compared to the prior exam. The 2. Sub xiphoid os ossific structure with adjacent faint soft tissue density, the ossific structure has similar metabolic activity to other normal bony structures. 3. Mildly enlarged right subcarinal lymph node is not hypermetabolic and is below mediastinal activity. 4. Splenomegaly, without splenic hypermetabolic activity. 5. Other imaging findings of potential clinical significance: Coronary, aortic arch, and branch vessel atherosclerotic vascular disease. Aortoiliac atherosclerotic vascular disease. Mild cardiomegaly. Trace pneumobilia, less than previous.   Electronically Signed   By: Van Clines M.D.   On: 08/14/2016 13:12      ASSESSMENT  & PLAN:  Stage II marginal zone lymphoma CKD Mild Thrombocytopenia, intermittent Diabetes Hyperglycemia Maintenance rituxan completed 12/08/16 He has completed 2 years of maintenance rituxan on 12/08/16.  -I have reviewed his CT scans in detail with him today. This showed mild mixed changes. The soft tissue mass abutting the bladder wall is mildly decreased in size. The subxiphoid mass is stable. Mild left inguinal lymph node is stable. The subcarinal lymph node is mildly increased in size from 1.7 cm previously to 2.0 cm. The AP window lymph node has mildly increased from 0.8 cm to 1 cm. I gave a copy of his scans to the patient. -No indication to restart treatment at this time. Indications for treatment include onset of new symptoms, GI bleeding, threatened end organ function, bulky disease, study are rapid progression. If he will need treatment in the future, I would consider options include bendamustine with Rituxan, R CHOP, Ibrutinib. -Return to clinic in 6 months for follow-up with labs as well as restaging PET/CT. Patient can only get oral contrast due to his renal function. I have told patient to come see me sooner should she develop any new bulky lymphadenopathy or B symptoms. He verbalized understanding.  Orders Placed This Encounter  Procedures  . NM PET Image Restag (PS) Skull Base To Thigh    Standing Status:   Future    Standing Expiration Date:   02/08/2018    Order Specific Question:   Reason for Exam (SYMPTOM  OR DIAGNOSIS REQUIRED)    Answer:   restaging scan for marginal zone lymphoma    Order Specific Question:   If indicated for the ordered procedure, I authorize the administration of a radiopharmaceutical per Radiology protocol    Answer:   Yes    Order Specific Question:   Preferred imaging location?    Answer:   [facility]    Order Specific Question:  Radiology Contrast Protocol - do NOT remove file path    Answer:   [email or web address]    . CBC with Differential    Standing Status:   Future    Standing Expiration Date:   02/08/2018  . Comprehensive metabolic panel    Standing Status:   Future    Standing Expiration Date:   02/08/2018  . Lactate dehydrogenase    Standing Status:   Future    Standing Expiration Date:   02/08/2018    NCCN Surveillance Guidelines for Marginal Zone Lymphoma:  H&P and labs every 3-6 months for 5 years then annually or as clinically indicated. Surveillance imaging: up to 2 years post completion of treatment: CT C/A/P with contrast no more than every 6 months. >2 years: CT scan no more than annually.  All questions were answered. The patient knows to call the clinic with any problems, questions or concerns.   Twana First, MD

## 2017-02-19 ENCOUNTER — Telehealth: Payer: Self-pay | Admitting: Adult Health

## 2017-02-19 MED ORDER — NITROGLYCERIN 0.4 MG SL SUBL: 0.4000 mg | SUBLINGUAL_TABLET | SUBLINGUAL | 3 refills | Status: DC | PRN

## 2017-02-19 NOTE — Telephone Encounter (Signed)
Ok to send Rx for NTG sublingual.

## 2017-02-19 NOTE — Telephone Encounter (Signed)
Patient states that he was told at last office visit that he was gonna have NTG sent to pharmacy but has not received it. / tg

## 2017-02-19 NOTE — Telephone Encounter (Signed)
Please advise if this patient can have a nitroglycerin rx sent to his pharmacy.

## 2017-02-19 NOTE — Telephone Encounter (Signed)
Patient informed. 

## 2017-02-24 ENCOUNTER — Other Ambulatory Visit: Payer: Self-pay | Admitting: Adult Health

## 2017-03-18 ENCOUNTER — Encounter (HOSPITAL_COMMUNITY): Payer: Medicare HMO

## 2017-03-19 ENCOUNTER — Other Ambulatory Visit: Payer: Self-pay | Admitting: Adult Health

## 2017-03-20 ENCOUNTER — Other Ambulatory Visit: Payer: Self-pay

## 2017-03-20 ENCOUNTER — Emergency Department (HOSPITAL_COMMUNITY): Payer: Medicare HMO

## 2017-03-20 ENCOUNTER — Observation Stay (HOSPITAL_COMMUNITY)
Admission: EM | Admit: 2017-03-20 | Discharge: 2017-03-21 | Disposition: A | Discharge status: Home / Self Care | Payer: Medicare HMO | Attending: Internal Medicine | Admitting: Internal Medicine

## 2017-03-20 ENCOUNTER — Encounter (HOSPITAL_COMMUNITY): Payer: Self-pay | Admitting: Emergency Medicine

## 2017-03-20 DIAGNOSIS — Z7982 Long term (current) use of aspirin: Secondary | ICD-10-CM | POA: Diagnosis not present

## 2017-03-20 DIAGNOSIS — R778 Other specified abnormalities of plasma proteins: Secondary | ICD-10-CM | POA: Diagnosis present

## 2017-03-20 DIAGNOSIS — Z79899 Other long term (current) drug therapy: Secondary | ICD-10-CM | POA: Insufficient documentation

## 2017-03-20 DIAGNOSIS — R0600 Dyspnea, unspecified: Principal | ICD-10-CM | POA: Insufficient documentation

## 2017-03-20 DIAGNOSIS — R0602 Shortness of breath: Secondary | ICD-10-CM | POA: Diagnosis present

## 2017-03-20 DIAGNOSIS — I129 Hypertensive chronic kidney disease with stage 1 through stage 4 chronic kidney disease, or unspecified chronic kidney disease: Secondary | ICD-10-CM | POA: Diagnosis not present

## 2017-03-20 DIAGNOSIS — E1122 Type 2 diabetes mellitus with diabetic chronic kidney disease: Secondary | ICD-10-CM | POA: Diagnosis present

## 2017-03-20 DIAGNOSIS — R0609 Other forms of dyspnea: Secondary | ICD-10-CM | POA: Diagnosis present

## 2017-03-20 DIAGNOSIS — E039 Hypothyroidism, unspecified: Secondary | ICD-10-CM | POA: Insufficient documentation

## 2017-03-20 DIAGNOSIS — Z87891 Personal history of nicotine dependence: Secondary | ICD-10-CM | POA: Diagnosis not present

## 2017-03-20 DIAGNOSIS — N184 Chronic kidney disease, stage 4 (severe): Secondary | ICD-10-CM | POA: Insufficient documentation

## 2017-03-20 DIAGNOSIS — E119 Type 2 diabetes mellitus without complications: Secondary | ICD-10-CM | POA: Diagnosis not present

## 2017-03-20 DIAGNOSIS — G4730 Sleep apnea, unspecified: Secondary | ICD-10-CM | POA: Diagnosis present

## 2017-03-20 DIAGNOSIS — I10 Essential (primary) hypertension: Secondary | ICD-10-CM

## 2017-03-20 DIAGNOSIS — N189 Chronic kidney disease, unspecified: Secondary | ICD-10-CM | POA: Diagnosis present

## 2017-03-20 DIAGNOSIS — R7989 Other specified abnormal findings of blood chemistry: Secondary | ICD-10-CM | POA: Diagnosis present

## 2017-03-20 LAB — BASIC METABOLIC PANEL
Anion gap: 10 (ref 5–15)
BUN: 25 mg/dL — ABNORMAL HIGH (ref 6–20)
CHLORIDE: 105 mmol/L (ref 101–111)
CO2: 25 mmol/L (ref 22–32)
Calcium: 9.1 mg/dL (ref 8.9–10.3)
Creatinine, Ser: 2.33 mg/dL — ABNORMAL HIGH (ref 0.61–1.24)
GFR calc Af Amer: 31 mL/min — ABNORMAL LOW (ref >60)
GFR calc non Af Amer: 27 mL/min — ABNORMAL LOW (ref >60)
GLUCOSE: 157 mg/dL — AB (ref 65–99)
POTASSIUM: 3.4 mmol/L — AB (ref 3.5–5.1)
Sodium: 140 mmol/L (ref 135–145)

## 2017-03-20 LAB — CBC
HEMATOCRIT: 44.4 % (ref 39.0–52.0)
Hemoglobin: 14.9 g/dL (ref 13.0–17.0)
MCH: 29 pg (ref 26.0–34.0)
MCHC: 33.6 g/dL (ref 30.0–36.0)
MCV: 86.5 fL (ref 78.0–100.0)
Platelets: 115 10*3/uL — ABNORMAL LOW (ref 150–400)
RBC: 5.13 MIL/uL (ref 4.22–5.81)
RDW: 15.7 % — AB (ref 11.5–15.5)
WBC: 7.5 10*3/uL (ref 4.0–10.5)

## 2017-03-20 LAB — BRAIN NATRIURETIC PEPTIDE: B Natriuretic Peptide: 247 pg/mL — ABNORMAL HIGH (ref 0.0–100.0)

## 2017-03-20 LAB — D-DIMER, QUANTITATIVE: D-Dimer, Quant: 0.27 ug/mL-FEU (ref 0.00–0.50)

## 2017-03-20 LAB — POCT I-STAT TROPONIN I: Troponin i, poc: 0.07 ng/mL (ref 0.00–0.08)

## 2017-03-20 MED ORDER — HYDRALAZINE HCL 20 MG/ML IJ SOLN
10.0000 mg | Freq: Once | INTRAMUSCULAR | Status: AC
  Administered 2017-03-20: 10 mg via INTRAVENOUS
  Filled 2017-03-20: qty 1

## 2017-03-20 MED ORDER — ALBUTEROL SULFATE (2.5 MG/3ML) 0.083% IN NEBU
5.0000 mg | INHALATION_SOLUTION | Freq: Once | RESPIRATORY_TRACT | Status: AC
  Administered 2017-03-20: 5 mg via RESPIRATORY_TRACT
  Filled 2017-03-20: qty 6

## 2017-03-20 NOTE — ED Triage Notes (Signed)
Pt reports short of breath and saw the physician 10 days ago for same was given meds but reports no better  Pt speaks clearly with a stong voice  [place]

## 2017-03-20 NOTE — ED Provider Notes (Signed)
[facility] DEPT Provider Note   CSN: [id number] Arrival date & time: 03/20/17  1933     History   Chief Complaint Chief Complaint  Patient presents with  . Shortness of Breath    HPI [name] [name] is a 71 y.o. male.  HPI   Patient is a 71 year old male with a past history of MI in June 2018, diabetes mellitus, CKD Stage IV, sleep apnea, hypothyroidism, hypertension presenting for a 2 week history of dyspnea. Patient reports that this episode of shortness of breath got significantly worse today and that he needs to get this evaluated given his history. Patient saw his primary care provider on August 10, diagnosed with acute bronchitis and given a Z-Pak as well as an inhaler. Patient reports that shortness of breath did not improve with this treatment. Patient reports that he is very dyspneic on exertion. Patient reports that he is typically able to lie flat at night on his side and he has not had any difficulty breathing at night aside from baseline sleep apnea. Patient denies any fever or chills. Patient has not had any headaches, dizziness, lightheadedness, syncope/syncope. Patient feels that he has had some congestion and that may be contributing to some shortness of breath. Patient reports that when he presented for his MI in June 2018 he does not have typical chest pain. He reports that he had a "funny feeling in his chest." Patient ultimately underwent PCI to the mid LAD.   Past Medical History:  Diagnosis Date  . Cancer (HCC)    low grade non hodgkin's lymphoma  . Chronic kidney disease    stage III kidney disease   . Diabetes mellitus   . Hypothyroidism   . Lymphoma ([place])   . Peripheral vascular disease (HCC)    neuropathy in toes   . Pneumonia    hx of   . S/P biopsy   . Shortness of breath    due ot pain in knees   . Sleep apnea    sleep study 3/29 13 ? location     Patient Active Problem List   Diagnosis Date Noted  . NSTEMI (non-ST elevated myocardial  infarction) ([place]) 01/19/2017  . NSTEMI, initial episode of care ([place]) 01/19/2017  . Sepsis ([place]) 07/24/2016  . Elevated troponin I level 07/24/2016  . Fever 07/24/2016  . Lactic acidosis 07/24/2016  . Adjustment insomnia 05/18/2016  . Erectile dysfunction 12/19/2015  . Sleep apnea 09/30/2015  . Osteoarthritis 09/30/2015  . Hypothyroidism 09/30/2015  . Hypogonadism in male 09/30/2015  . Diabetic neuropathy ([place]) 09/30/2015  . Chronic pain of left knee 09/30/2015  . Benign essential hypertension 09/30/2015  . Acute on chronic kidney failure ([place]) 05/14/2015  . Diarrhea 05/14/2015  . Dehydration 05/14/2015  . Hypokalemia 05/14/2015  . 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 Pelvic mass in male   . Varices, esophageal ([place])   . Colonic mass   . Abnormal CT scan, pelvis   . Bladder mass   . Elevated liver enzymes   . Elevated LFTs   . Abdominal pain 07/23/2014  . Chronic kidney disease Stage IV 07/23/2014  . Morbid obesity ([place]) 07/23/2014  . Type 2 diabetes mellitus with stage 4 chronic kidney disease ([place]) 07/23/2014  . Hypertension 07/23/2014  . S/P right TKA 11/11/2011    Past Surgical History:  Procedure Laterality Date  . CHOLECYSTECTOMY  1992  . COLONOSCOPY N/A 09/03/2014   SLF:six colon polyps removed/small internal hemorrhoids  . CORONARY STENT INTERVENTION N/A 01/21/2017  Procedure: Coronary Stent Intervention;  Surgeon: Nelva Bush, MD;  Location: [facility];  Service: Cardiovascular;  Laterality: N/A;  . ESOPHAGOGASTRODUODENOSCOPY N/A 09/03/2014   SLF: mild gastritis/few gastric polyps  . LEFT HEART CATH AND CORONARY ANGIOGRAPHY N/A 01/20/2017   Procedure: Left Heart Cath and Coronary Angiography;  Surgeon: Jettie Booze, MD;  Location: [facility];  Service: Cardiovascular;  Laterality: N/A;  . TOTAL KNEE ARTHROPLASTY  11/10/2011   Procedure: TOTAL KNEE ARTHROPLASTY;  Surgeon: Mauri Pole, MD;  Location: [facility];  Service: Orthopedics;   Laterality: Right;       Home Medications    Prior to Admission medications   Medication Sig Start Date End Date Taking? Authorizing Provider  aspirin 81 MG tablet Take 81 mg by mouth daily.    [provider]  calcipotriene (DOVONOX) 0.005 % cream  01/08/17   [provider]  clopidogrel (PLAVIX) 75 MG tablet Take 1 tablet (75 mg total) by mouth daily. 01/23/17   Florencia Reasons, MD  fluticasone (FLONASE) 50 MCG/ACT nasal spray Place 1 spray into both nostrils daily.    [provider]  gabapentin (NEURONTIN) 100 MG capsule Take 100 mg by mouth at bedtime.    [provider]  hydrocortisone 2.5 % cream APPLY TOPICALLY DAILY AS NEEDED TO CATH SITE 02/25/17   Lendon Colonel, NP  insulin aspart (NOVOLOG FLEXPEN) 100 UNIT/ML FlexPen Inject into the skin. Sliding scale As needed for blood sugar    [provider]  insulin degludec (TRESIBA FLEXTOUCH) 100 UNIT/ML SOPN FlexTouch Pen Inject 0.5 mLs (50 Units total) into the skin daily at 10 pm. Patient taking differently: Inject 60 Units into the skin daily at 10 pm.  07/28/16   Kathie Dike, MD  levothyroxine (SYNTHROID, LEVOTHROID) 100 MCG tablet Take 100 mcg by mouth daily before breakfast.    [provider]  levothyroxine (SYNTHROID, LEVOTHROID) 200 MCG tablet Take 200 mcg by mouth daily before breakfast.    [provider]  losartan (COZAAR) 100 MG tablet Take 100 mg by mouth. 02/02/17   [provider]  metoprolol tartrate (LOPRESSOR) 25 MG tablet Take 0.5 tablets (12.5 mg total) by mouth 2 (two) times daily. 01/22/17   Florencia Reasons, MD  montelukast (SINGULAIR) 10 MG tablet Take 10 mg by mouth. 08/07/16   [provider]  nitroGLYCERIN (NITROSTAT) 0.4 MG SL tablet Place 1 tablet (0.4 mg total) under the tongue every 5 (five) minutes as needed for chest pain. Up to 3 doses. If no relief after the 3rd dose, proceed to the ED for an evaluation 02/19/17 05/20/17  Lendon Colonel, NP  omega-3 acid ethyl esters (LOVAZA) 1 g capsule Take 1 capsule by mouth daily.  11/26/16   [provider]  simvastatin (ZOCOR) 5 MG tablet Take 5 mg by mouth daily. 09/14/16   [provider]  traZODone (DESYREL) 50 MG tablet TAKE 1 TO 2 TABLETS AT BEDTIME FOR INSOMNIA 09/30/16   [provider]  verapamil (CALAN-SR) 240 MG CR tablet  01/09/17   [provider]    Family History Family History  Problem Relation Age of Onset  . Cancer Mother        breast and lung  . Cancer Father        bladder  . Cancer Maternal Uncle        prostate  . Cancer Paternal Uncle        esophagus  . Colon cancer  Neg Hx     Social History Social History  Substance Use Topics  . Smoking status: Former Smoker    Packs/day: 1.00    Years: 30.00    Types: Cigarettes    Quit date: 08/04/2007  . Smokeless tobacco: Never Used  . Alcohol use No     Allergies   Patient has no known allergies.   Review of Systems Review of Systems  Constitutional: Positive for activity change and appetite change. Negative for chills, diaphoresis, fatigue and fever.  HENT: Positive for congestion and postnasal drip. Negative for sore throat.   Eyes: Negative for pain and visual disturbance.  Respiratory: Positive for cough and shortness of breath. Negative for chest tightness and wheezing.        Nonproductive cough. Patient feels as if he has phlegm in his chest.  Cardiovascular: Negative for chest pain, palpitations and leg swelling.  Gastrointestinal: Negative for abdominal pain, diarrhea, nausea and vomiting.  Genitourinary: Negative for difficulty urinating, flank pain and urgency.  Musculoskeletal: Negative for arthralgias, back pain and myalgias.  Skin: Negative for color change and rash.  Neurological: Negative for dizziness, syncope and light-headedness.  Hematological: Bruises/bleeds easily.       Patient reports this has occurred since starting Plavix.      Physical Exam Updated Vital Signs BP (!) 198/71   Pulse 72   Temp 98.3 F (36.8 C) (Oral)   Resp 17   Ht 6' (1.829 m)   Wt 136.1 kg (300 lb)   SpO2 97%   BMI 40.69 kg/m   Physical Exam  Constitutional: He appears well-developed and well-nourished. No distress.  Sitting comfortably in examining bed.  HENT:  Head: Normocephalic and atraumatic.  Mouth/Throat: Oropharynx is clear and moist.  Eyes: Pupils are equal, round, and reactive to light. Conjunctivae and EOM are normal.  Neck: Normal range of motion. Neck supple.  Cardiovascular: Normal rate, regular rhythm, S1 normal and S2 normal.   No murmur heard. Trace lower extremity nonpitting edema. Edema is symmetric bilaterally.  Pulmonary/Chest: Effort normal and breath sounds normal. He has no wheezes. He has no rales.  Abdominal: Soft. He exhibits no distension. There is no tenderness. There is no guarding.  Musculoskeletal: Normal range of motion. He exhibits no edema or deformity.  Lymphadenopathy:    He has no cervical adenopathy.  Neurological: He is alert.  Cranial nerves grossly intact.  Strength 5/5 upper and lower extremities.  Skin: Skin is warm and dry. No rash noted. No erythema.  Psychiatric: He has a normal mood and affect. His behavior is normal. Judgment and thought content normal.  Nursing note and vitals reviewed.    ED Treatments / Results  Labs (all labs ordered are listed, but only abnormal results are displayed) Labs Reviewed  BASIC METABOLIC PANEL - Abnormal; Notable for the following:       Result Value   Potassium 3.4 (*)    Glucose, Bld 157 (*)    BUN 25 (*)    Creatinine, Ser 2.33 (*)    GFR calc non Af Amer 27 (*)    GFR calc Af Amer 31 (*)    All other components within normal limits  CBC - Abnormal; Notable for the following:    RDW 15.7 (*)    Platelets 115 (*)    All other components within normal limits  TROPONIN I - Abnormal; Notable for the following:    Troponin I 0.07  (*)    All other components within  normal limits  BRAIN NATRIURETIC PEPTIDE - Abnormal; Notable for the following:    B Natriuretic Peptide 247.0 (*)    All other components within normal limits  D-DIMER, QUANTITATIVE (NOT AT [facility])  POCT I-STAT TROPONIN I    EKG  EKG Interpretation  Date/Time:  Saturday March 20 2017 20:01:10 EDT Ventricular Rate:  65 PR Interval:  240 QRS Duration: 130 QT Interval:  458 QTC Calculation: 476 R Axis:   -57 Text Interpretation:  Sinus rhythm with 1st degree A-V block Left axis deviation Left bundle branch block Abnormal ECG Since last tracing rate slower Confirmed by Noemi Chapel [phone]) on 03/20/2017 10:52:21 PM       Radiology Dg Chest 2 View  Result Date: 03/20/2017 CLINICAL DATA:  Shortness of Breath EXAM: CHEST  2 VIEW COMPARISON:  Chest radiograph January 19, 2017 and chest CT February 05, 2017 FINDINGS: There is no edema or consolidation. Heart is mildly enlarged with pulmonary vascularity within normal limits. No adenopathy. No bone lesions. IMPRESSION: Mild cardiac enlargement.  No edema or consolidation. Electronically Signed   By: Lowella Grip III M.D.   On: 03/20/2017 20:30    Procedures Procedures (including critical care time)  Medications Ordered in ED Medications  albuterol (PROVENTIL) (2.5 MG/3ML) 0.083% nebulizer solution 5 mg (5 mg Nebulization Given 03/20/17 2154)  hydrALAZINE (APRESOLINE) injection 10 mg (10 mg Intravenous Given 03/20/17 2303)  labetalol (NORMODYNE,TRANDATE) injection 10 mg (10 mg Intravenous Given 03/21/17 0117)     Initial Impression / Assessment and Plan / ED Course  I have reviewed the triage vital signs and the nursing notes.  Pertinent labs & imaging results that were available during my care of the patient were reviewed by me and considered in my medical decision making (see chart for details).    2230. Consulted with Dr. Sabra Heck regarding workup of this patient.  0115. Patient seen and evaluated.  The patient breathing much better. Patient reports nebulizer treatment provided relief in breathing.  Final Clinical Impressions(s) / ED Diagnoses   Final diagnoses:  Elevated troponin  Dyspnea, unspecified type  Hypertension, unspecified type  Chronic renal insufficiency, stage 4 (severe) ([place])   Patient is a 71 year old male with a past history of MI in June 2018, diabetes mellitus, sleep apnea, hypothyroidism, hypertension presenting for a 2 week history of dyspnea. Differential diagnosis includes ACS, heart failure, arrhythmia, PE, acute bronchitis, COPD. results of patient's chest x-ray show pulmonary vascular congestion but no pulmonary edema as the source of patient's significant dyspnea.  Patient's troponin was elevated at 0.07. Patient has a history going back to December 2017 of elevated troponins of 0.06 with renal function consistent with his current creatinine. In the setting of patient's recent MI and suddenly worsening dyspnea today, patient is high risk for ACS. Patient has no prior history of DVT, however patient does have stable tumors due to lymphoma. Patient is not actively treated for lymphoma at this time. Results of d-dimer testing were negative.  Cardiology consult placed, and patient admitted to hospitalist service for additional cardiac evaluation.  New Prescriptions New Prescriptions   No medications on file     Tamala Julian 03/21/17 9211    Noemi Chapel, MD 03/21/17 [phone]

## 2017-03-20 NOTE — ED Notes (Signed)
Informed of delay  Awaiting next room open

## 2017-03-20 NOTE — ED Provider Notes (Signed)
The patient is a 71 year old male who has a known history of recent coronary obstruction which required stenting, returns to the hospital today after having shortness of breath for the last couple of weeks with some increased dyspnea on exertion and shortness of breath at rest which occurred today. There has been no increasing pain, no increasing swelling, no fevers and no coughing though he states that he can try to cough up phlegm which doesn't seem to come up. He has artery been given antibiotics and albuterol treatments from his doctor but this has not given much in the way of relief. He states that it did help initially but then he symptoms came back.  On exam the patient has a regular rate and rhythm, normal pulses, no significant pitting edema, no JVD, clear lungs without rales or wheezing, speaks in full sentences and has a soft nontender abdomen. His oropharynx is clear, his nasal pharynx is also clear, nasal passages without obstruction or drainage.  The patient has an unchanged EKG, he does have occasional ectopy on the monitor. We'll check labs including a BNP, troponin and a chest x-ray.  Has had no edema on xray Trop and BNP not diagnostic but Trop is elevated  Cr close to baseline.  CBC without any findings Pt possibly has ongoing respiratory illness and now states that sometimes he has trouble breathing from mouth, sometimes from nose but not orthopneic.  He does have DOE.  I have discussed his care with Dr. Raiford Simmonds with Cardiology at [facility] - he agrees with trending Troponins and stress if negative, cath if positive.  D/w Dr. Marin Comment - will admit  Dg Chest 2 View  Result Date: 03/20/2017 CLINICAL DATA:  Shortness of Breath EXAM: CHEST  2 VIEW COMPARISON:  Chest radiograph January 19, 2017 and chest CT February 05, 2017 FINDINGS: There is no edema or consolidation. Heart is mildly enlarged with pulmonary vascularity within normal limits. No adenopathy. No bone lesions. IMPRESSION: Mild cardiac  enlargement.  No edema or consolidation. Electronically Signed   By: Lowella Grip III M.D.   On: 03/20/2017 20:30    EKG Interpretation  Date/Time:  Saturday March 20 2017 20:01:10 EDT Ventricular Rate:  65 PR Interval:  240 QRS Duration: 130 QT Interval:  458 QTC Calculation: 476 R Axis:   -57 Text Interpretation:  Sinus rhythm with 1st degree A-V block Left axis deviation Left bundle branch block Abnormal ECG Since last tracing rate slower Confirmed by Noemi Chapel [phone]) on 03/20/2017 10:52:21 PM      Medical screening examination/treatment/procedure(s) were conducted as a shared visit with non-physician practitioner(s) and myself.  I personally evaluated the patient during the encounter.  Clinical Impression:   Final diagnoses:  Elevated troponin  Dyspnea, unspecified type  Hypertension, unspecified type  Chronic renal insufficiency, stage 4 (severe) (HCC)          Noemi Chapel, MD 03/21/17 1506

## 2017-03-20 NOTE — ED Triage Notes (Signed)
Pt c/o sob x 2 weeks and seen his pcp earlier this week for the same. Pt states he was feeling better but felt worse today.

## 2017-03-20 NOTE — Progress Notes (Signed)
Noted patient heart rate dropping to low 30's with slowing . BP is high Lungs sounds appear decreased . No cough, patient on 2 kinds of BP meds. Normal heart rate 50's to 60. Pa notified. Suspect slight failure with chest x-ray not showing failure.

## 2017-03-21 ENCOUNTER — Encounter (HOSPITAL_COMMUNITY): Payer: Self-pay

## 2017-03-21 DIAGNOSIS — R748 Abnormal levels of other serum enzymes: Secondary | ICD-10-CM | POA: Diagnosis not present

## 2017-03-21 DIAGNOSIS — R0600 Dyspnea, unspecified: Secondary | ICD-10-CM | POA: Diagnosis present

## 2017-03-21 DIAGNOSIS — R0602 Shortness of breath: Secondary | ICD-10-CM | POA: Diagnosis not present

## 2017-03-21 DIAGNOSIS — N183 Chronic kidney disease, stage 3 (moderate): Secondary | ICD-10-CM | POA: Diagnosis not present

## 2017-03-21 DIAGNOSIS — I10 Essential (primary) hypertension: Secondary | ICD-10-CM | POA: Diagnosis present

## 2017-03-21 DIAGNOSIS — R0609 Other forms of dyspnea: Secondary | ICD-10-CM | POA: Diagnosis present

## 2017-03-21 LAB — TROPONIN I
Troponin I: 0.07 ng/mL (ref <0.03)
Troponin I: 0.07 ng/mL (ref <0.03)
Troponin I: 0.06 ng/mL (ref <0.03)

## 2017-03-21 LAB — CBG MONITORING, ED
Glucose-Capillary: 167 mg/dL — ABNORMAL HIGH (ref 65–99)
Glucose-Capillary: 143 mg/dL — ABNORMAL HIGH (ref 65–99)

## 2017-03-21 LAB — TSH: TSH: 0.67 u[IU]/mL (ref 0.350–4.500)

## 2017-03-21 LAB — GLUCOSE, CAPILLARY: Glucose-Capillary: 180 mg/dL — ABNORMAL HIGH (ref 65–99)

## 2017-03-21 MED ORDER — INSULIN GLARGINE 100 UNIT/ML ~~LOC~~ SOLN
50.0000 [IU] | Freq: Every day | SUBCUTANEOUS | Status: DC
  Filled 2017-03-21: qty 0.5

## 2017-03-21 MED ORDER — AMLODIPINE BESYLATE 5 MG PO TABS
5.0000 mg | ORAL_TABLET | Freq: Every day | ORAL | Status: DC
  Administered 2017-03-21: 5 mg via ORAL
  Filled 2017-03-21: qty 1

## 2017-03-21 MED ORDER — BISOPROLOL FUMARATE 5 MG PO TABS: 5.0000 mg | ORAL_TABLET | Freq: Every day | ORAL | 6 refills | Status: DC

## 2017-03-21 MED ORDER — INSULIN ASPART 100 UNIT/ML ~~LOC~~ SOLN: 0.0000 [IU] | Freq: Every day | SUBCUTANEOUS | Status: DC

## 2017-03-21 MED ORDER — TRAZODONE HCL 50 MG PO TABS: 50.0000 mg | ORAL_TABLET | Freq: Every evening | ORAL | Status: DC | PRN

## 2017-03-21 MED ORDER — ALBUTEROL SULFATE (2.5 MG/3ML) 0.083% IN NEBU
2.5000 mg | INHALATION_SOLUTION | Freq: Four times a day (QID) | RESPIRATORY_TRACT | Status: DC
  Administered 2017-03-21 (×3): 2.5 mg via RESPIRATORY_TRACT
  Filled 2017-03-21 (×3): qty 3

## 2017-03-21 MED ORDER — VERAPAMIL HCL ER 240 MG PO TBCR
240.0000 mg | EXTENDED_RELEASE_TABLET | Freq: Every day | ORAL | Status: DC
  Administered 2017-03-21: 240 mg via ORAL
  Filled 2017-03-21 (×3): qty 1

## 2017-03-21 MED ORDER — CLOPIDOGREL BISULFATE 75 MG PO TABS
75.0000 mg | ORAL_TABLET | Freq: Every day | ORAL | Status: DC
  Administered 2017-03-21: 75 mg via ORAL
  Filled 2017-03-21: qty 1

## 2017-03-21 MED ORDER — AZITHROMYCIN 250 MG PO TABS: 500.0000 mg | ORAL_TABLET | Freq: Every day | ORAL | Status: DC

## 2017-03-21 MED ORDER — NITROGLYCERIN 0.4 MG SL SUBL: 0.4000 mg | SUBLINGUAL_TABLET | SUBLINGUAL | 3 refills | Status: DC | PRN

## 2017-03-21 MED ORDER — LEVOTHYROXINE SODIUM 100 MCG PO TABS
200.0000 ug | ORAL_TABLET | Freq: Every day | ORAL | Status: DC
  Administered 2017-03-21: 200 ug via ORAL
  Filled 2017-03-21: qty 2
  Filled 2017-03-21: qty 4

## 2017-03-21 MED ORDER — GABAPENTIN 100 MG PO CAPS: 100.0000 mg | ORAL_CAPSULE | Freq: Every day | ORAL | Status: DC

## 2017-03-21 MED ORDER — INSULIN ASPART 100 UNIT/ML ~~LOC~~ SOLN
0.0000 [IU] | Freq: Three times a day (TID) | SUBCUTANEOUS | Status: DC
  Administered 2017-03-21 (×2): 2 [IU] via SUBCUTANEOUS
  Filled 2017-03-21: qty 1

## 2017-03-21 MED ORDER — ALBUTEROL SULFATE HFA 108 (90 BASE) MCG/ACT IN AERS: 2.0000 | INHALATION_SPRAY | Freq: Four times a day (QID) | RESPIRATORY_TRACT | 2 refills | Status: DC | PRN

## 2017-03-21 MED ORDER — ASPIRIN EC 81 MG PO TBEC
81.0000 mg | DELAYED_RELEASE_TABLET | Freq: Every day | ORAL | Status: DC
  Administered 2017-03-21: 81 mg via ORAL
  Filled 2017-03-21: qty 1

## 2017-03-21 MED ORDER — LEVOTHYROXINE SODIUM 100 MCG PO TABS
100.0000 ug | ORAL_TABLET | Freq: Every day | ORAL | Status: DC
  Administered 2017-03-21: 100 ug via ORAL
  Filled 2017-03-21: qty 1
  Filled 2017-03-21: qty 2

## 2017-03-21 MED ORDER — OMEGA-3-ACID ETHYL ESTERS 1 G PO CAPS
1.0000 | ORAL_CAPSULE | Freq: Every day | ORAL | Status: DC
  Administered 2017-03-21: 1 g via ORAL
  Filled 2017-03-21 (×3): qty 1

## 2017-03-21 MED ORDER — AMLODIPINE BESYLATE 10 MG PO TABS: 10.0000 mg | ORAL_TABLET | Freq: Every day | ORAL | 11 refills | Status: DC

## 2017-03-21 MED ORDER — BISOPROLOL FUMARATE 5 MG PO TABS
5.0000 mg | ORAL_TABLET | Freq: Every day | ORAL | Status: DC
  Administered 2017-03-21: 5 mg via ORAL
  Filled 2017-03-21: qty 1

## 2017-03-21 MED ORDER — SIMVASTATIN 10 MG PO TABS
5.0000 mg | ORAL_TABLET | Freq: Every day | ORAL | Status: DC
  Administered 2017-03-21: 5 mg via ORAL
  Filled 2017-03-21: qty 1

## 2017-03-21 MED ORDER — MONTELUKAST SODIUM 10 MG PO TABS: 10.0000 mg | ORAL_TABLET | Freq: Every day | ORAL | Status: DC

## 2017-03-21 MED ORDER — LABETALOL HCL 5 MG/ML IV SOLN
10.0000 mg | Freq: Once | INTRAVENOUS | Status: AC
  Administered 2017-03-21: 10 mg via INTRAVENOUS
  Filled 2017-03-21: qty 4

## 2017-03-21 MED ORDER — LABETALOL HCL 5 MG/ML IV SOLN: 10.0000 mg | INTRAVENOUS | Status: DC | PRN

## 2017-03-21 MED ORDER — METOPROLOL TARTRATE 25 MG PO TABS
12.5000 mg | ORAL_TABLET | Freq: Two times a day (BID) | ORAL | Status: DC
  Administered 2017-03-21: 12.5 mg via ORAL
  Filled 2017-03-21: qty 1

## 2017-03-21 MED ORDER — INSULIN DEGLUDEC 100 UNIT/ML ~~LOC~~ SOPN: 50.0000 [IU] | PEN_INJECTOR | Freq: Every day | SUBCUTANEOUS | Status: DC

## 2017-03-21 NOTE — Progress Notes (Signed)
Patient IV and telemetry removed, tolerated well. Discharge instructions given at bedside.

## 2017-03-21 NOTE — ED Notes (Signed)
CRITICAL VALUE ALERT  Critical Value: Troponin 0.07 Date & Time Notied: 03/21/17@0022  Provider Notified: Alyssa Murray,PA-C Orders Received/Actions taken:none yet

## 2017-03-21 NOTE — ED Notes (Signed)
ED Provider at bedside. 

## 2017-03-21 NOTE — Discharge Summary (Signed)
Physician Discharge Summary  [name] [name] WFU:[id number] DOB: [birth year] DOA: 03/20/2017  PCP: [name] [place] date: 03/20/2017 Discharge date: 03/21/2017  Admitted From: (Home Disposition:  Home)  Recommendations for Outpatient Follow-up:  1. Follow up with PCP in 1 week  Home Health:NO Equipment/Devices:none   Discharge Condition: (Stable CODE STATUS:(FULLDiet recommendation:   Heart Healthy / Carb Modified   Brief/Interim Summary:   71 y.o. male with hx of NSTEMI 2 month ago, troponin of 26, culminated into cath and stent placement to the LAD,  Cath showed totally occluded 1st Mrg (culprit), small ramus 75%, 80% LAD, DM, HTN, CKD4, presented to the ER as he has persistent SOB after started on ventolin inhaler and Zithromax by his PCP last week.  He has no chest pain and in the ER, neb Tx improved his SOB completely.  Work up in the ER showed EKG with 1 degree AVB, LBBB, no change from the old EKG, and troponin of 0.07, not different from his old troponin level.  Cr was 2.3. CXR was clear with CM.   His symptoms resolved quickly with Nubs and when verifying the story there was correlation between the start of BB and his SOB , I discussed the case with the cardiology over the phone and they recommended Bisoprolol and holding metoprolol , Proair inhaler provided to the patient , his symptoms resolved and he will be discharged to follow up as an outpt .    Discharge Diagnoses:     Metoprolol induced spasm    Chronic kidney disease Stage IV   Morbid obesity ([place])   Type 2 diabetes mellitus with stage 4 chronic kidney disease (HCC)   Elevated troponin I level   Sleep apnea   Hypothyroidism   HTN (hypertension)   Hx of CAD S/P LAD STENT .    Discharge Instructions  Discharge Instructions    Diet - low sodium heart healthy    Complete by:  As directed    Increase activity slowly    Complete by:  As directed      Allergies as of 03/21/2017    No Known Allergies     Medication List    STOP taking these medications   metoprolol tartrate 25 MG tablet Commonly known as:  LOPRESSOR   verapamil 240 MG CR tablet Commonly known as:  CALAN-SR     TAKE these medications   albuterol 108 (90 Base) MCG/ACT inhaler Commonly known as:  PROVENTIL HFA;VENTOLIN HFA Inhale 2 puffs into the lungs every 6 (six) hours as needed for wheezing or shortness of breath.   amLODipine 10 MG tablet Commonly known as:  NORVASC Take 1 tablet (10 mg total) by mouth daily.   aspirin 81 MG tablet Take 81 mg by mouth daily.   bisoprolol 5 MG tablet Commonly known as:  ZEBETA Take 1 tablet (5 mg total) by mouth daily.   calcipotriene 0.005 % cream Commonly known as:  DOVONOX   clopidogrel 75 MG tablet Commonly known as:  PLAVIX Take 1 tablet (75 mg total) by mouth daily.   fluticasone 50 MCG/ACT nasal spray Commonly known as:  FLONASE Place 1 spray into both nostrils daily.   gabapentin 100 MG capsule Commonly known as:  NEURONTIN Take 100 mg by mouth at bedtime.   hydrocortisone 2.5 % cream APPLY TOPICALLY DAILY AS NEEDED TO CATH SITE   insulin degludec 100 UNIT/ML Sopn FlexTouch Pen Commonly known as:  TRESIBA FLEXTOUCH Inject 0.5 mLs (50 Units  total) into the skin daily at 10 pm. What changed:  how much to take   levothyroxine 200 MCG tablet Commonly known as:  SYNTHROID, LEVOTHROID Take 200 mcg by mouth daily before breakfast.   levothyroxine 100 MCG tablet Commonly known as:  SYNTHROID, LEVOTHROID Take 100 mcg by mouth daily before breakfast.   losartan 100 MG tablet Commonly known as:  COZAAR Take 100 mg by mouth.   montelukast 10 MG tablet Commonly known as:  SINGULAIR Take 10 mg by mouth.   nitroGLYCERIN 0.4 MG SL tablet Commonly known as:  NITROSTAT Place 1 tablet (0.4 mg total) under the tongue every 5 (five) minutes as needed for chest pain. Up to 3 doses. If no relief after the 3rd dose, proceed to the ED for  an evaluation   NOVOLOG FLEXPEN 100 UNIT/ML FlexPen Generic drug:  insulin aspart Inject into the skin. Sliding scale As needed for blood sugar   omega-3 acid ethyl esters 1 g capsule Commonly known as:  LOVAZA Take 1 capsule by mouth daily.   simvastatin 5 MG tablet Commonly known as:  ZOCOR Take 5 mg by mouth daily.   traZODone 50 MG tablet Commonly known as:  DESYREL TAKE 1 TO 2 TABLETS AT BEDTIME FOR INSOMNIA       No Known Allergies  Consultations:  NONE     Procedures/Studies: Dg Chest 2 View  Result Date: 03/20/2017 CLINICAL DATA:  Shortness of Breath EXAM: CHEST  2 VIEW COMPARISON:  Chest radiograph January 19, 2017 and chest CT February 05, 2017 FINDINGS: There is no edema or consolidation. Heart is mildly enlarged with pulmonary vascularity within normal limits. No adenopathy. No bone lesions. IMPRESSION: Mild cardiac enlargement.  No edema or consolidation. Electronically Signed   By: Lowella Grip III M.D.   On: 03/20/2017 20:30    (Echo, Carotid, EGD, Colonoscopy, ERCP)    Subjective:   Discharge Exam: Vitals:   03/21/17 0824 03/21/17 1018  BP:  (!) 153/62  Pulse:  63  Resp:  20  Temp:  98 F (36.7 C)  SpO2: 95% 95%   Vitals:   03/21/17 0700 03/21/17 0730 03/21/17 0824 03/21/17 1018  BP: 127/81 139/75  (!) 153/62  Pulse: (!) 52   63  Resp: 16 (!) 0  20  Temp:    98 F (36.7 C)  TempSrc:    Oral  SpO2: 97%  95% 95%  Weight:    (!) 140 kg (308 lb 9.6 oz)  Height:    6' (1.829 m)    General: Pt is alert, awake, not in acute distress Cardiovascular: RRR, S1/S2 +, no rubs, no gallops Respiratory: CTA bilaterally, no wheezing, no rhonchi Abdominal: Soft, NT, ND, bowel sounds + Extremities: no edema, no cyanosis    The results of significant diagnostics from this hospitalization (including imaging, microbiology, ancillary and laboratory) are listed below for reference.     Microbiology: No results found for this or any previous visit  (from the past 240 hour(s)).   Labs: BNP (last 3 results)  Recent Labs  03/20/17 2234  BNP 700.1*   Basic Metabolic Panel:  Recent Labs Lab 03/20/17 2234  NA 140  K 3.4*  CL 105  CO2 25  GLUCOSE 157*  BUN 25*  CREATININE 2.33*  CALCIUM 9.1   Liver Function Tests: No results for input(s): AST, ALT, ALKPHOS, BILITOT, PROT, ALBUMIN in the last 168 hours. No results for input(s): LIPASE, AMYLASE in the last 168 hours. No results for  input(s): AMMONIA in the last 168 hours. CBC:  Recent Labs Lab 03/20/17 2234  WBC 7.5  HGB 14.9  HCT 44.4  MCV 86.5  PLT 115*   Cardiac Enzymes:  Recent Labs Lab 03/20/17 2234 03/21/17 0433  TROPONINI 0.07* 0.07*   BNP: Invalid input(s): POCBNP CBG:  Recent Labs Lab 03/21/17 0206 03/21/17 0723 03/21/17 1118  GLUCAP 167* 143* 180*   D-Dimer  Recent Labs  03/20/17 2303  DDIMER 0.27   Hgb A1c No results for input(s): HGBA1C in the last 72 hours. Lipid Profile No results for input(s): CHOL, HDL, LDLCALC, TRIG, CHOLHDL, LDLDIRECT in the last 72 hours. Thyroid function studies  Recent Labs  03/21/17 0433  TSH 0.670   Anemia work up No results for input(s): VITAMINB12, FOLATE, FERRITIN, TIBC, IRON, RETICCTPCT in the last 72 hours. Urinalysis    Component Value Date/Time   COLORURINE YELLOW 01/19/2017 1530   APPEARANCEUR HAZY (A) 01/19/2017 1530   LABSPEC 1.021 01/19/2017 1530   PHURINE 5.0 01/19/2017 1530   GLUCOSEU 150 (A) 01/19/2017 1530   HGBUR SMALL (A) 01/19/2017 1530   BILIRUBINUR NEGATIVE 01/19/2017 1530   KETONESUR NEGATIVE 01/19/2017 1530   PROTEINUR 100 (A) 01/19/2017 1530   UROBILINOGEN 4.0 (H) 07/23/2014 1356   NITRITE NEGATIVE 01/19/2017 1530   LEUKOCYTESUR NEGATIVE 01/19/2017 1530   Sepsis Labs Invalid input(s): PROCALCITONIN,  WBC,  LACTICIDVEN Microbiology No results found for this or any previous visit (from the past 240 hour(s)).   Time coordinating discharge: Over 30  minutes  SIGNED:   Waldron Session, MD  [facility] 03/21/2017, 11:45 AM Pager   If 7PM-7AM, please contact night-coverage [email or web address] Password [email or web address]

## 2017-03-21 NOTE — H&P (Signed)
History and Physical    [name] [name] JIR:[id number] DOB: [birth year] DOA: 03/20/2017  [name]  Patient coming from: Home.    Chief Complaint:   SOB for the past week.   HPI: [name] [name] is an 71 y.o. male with hx of NSTEMI 2 month ago, troponin of 26, culminated into cath and stent placement to the LAD,  Cath showed totally occluded 1st Mrg (culprit), small ramus 75%, 80% LAD, DM, HTN, CKD4, presented to the ER as he has persistent SOB after started on ventolin inhaler and Zithromax by his PCP last week.  He has no chest pain and in the ER, neb Tx improved his SOB completely.  Work up in the ER showed EKG with 1 degree AVB, LBBB, no change from the old EKG, and troponin of 0.07, not different from his old troponin level.  Cr was 2.3. CXR was clear with CM.  Hospitalist was asked to admit him for r/out.  When I saw him, he was hypertensive with SBP 190. HR 70 and asymptomatic.   ED Course:  See above.  Rewiew of Systems:  Constitutional: Negative for malaise, fever and chills. No significant weight loss or weight gain Eyes: Negative for eye pain, redness and discharge, diplopia, visual changes, or flashes of light. ENMT: Negative for ear pain, hoarseness, nasal congestion, sinus pressure and sore throat. No headaches; tinnitus, drooling, or problem swallowing. Cardiovascular: Negative for chest pain, palpitations, diaphoresis, dyspnea and peripheral edema. ; No orthopnea, PND Respiratory: Negative for cough, hemoptysis, and stridor. No pleuritic chestpain. Gastrointestinal: Negative for diarrhea, constipation,  melena, blood in stool, hematemesis, jaundice and rectal bleeding.    Genitourinary: Negative for frequency, dysuria, incontinence,flank pain and hematuria; Musculoskeletal: Negative for back pain and neck pain. Negative for swelling and trauma.;  Skin: . Negative for pruritus, rash, abrasions, bruising and skin lesion.; ulcerations Neuro: Negative  for headache, lightheadedness and neck stiffness. Negative for weakness, altered level of consciousness , altered mental status, extremity weakness, burning feet, involuntary movement, seizure and syncope.  Psych: negative for anxiety, depression, insomnia, tearfulness, panic attacks, hallucinations, paranoia, suicidal or homicidal ideation   Past Medical History:  Diagnosis Date  . Cancer (HCC)    low grade non hodgkin's lymphoma  . Chronic kidney disease    stage III kidney disease   . Diabetes mellitus   . Hypothyroidism   . Lymphoma ([place])   . Peripheral vascular disease (HCC)    neuropathy in toes   . Pneumonia    hx of   . S/P biopsy   . Shortness of breath    due ot pain in knees   . Sleep apnea    sleep study 3/29 13 ? location     Past Surgical History:  Procedure Laterality Date  . CHOLECYSTECTOMY  1992  . COLONOSCOPY N/A 09/03/2014   SLF:six colon polyps removed/small internal hemorrhoids  . CORONARY STENT INTERVENTION N/A 01/21/2017   Procedure: Coronary Stent Intervention;  Surgeon: Nelva Bush, MD;  Location: [facility];  Service: Cardiovascular;  Laterality: N/A;  . ESOPHAGOGASTRODUODENOSCOPY N/A 09/03/2014   SLF: mild gastritis/few gastric polyps  . LEFT HEART CATH AND CORONARY ANGIOGRAPHY N/A 01/20/2017   Procedure: Left Heart Cath and Coronary Angiography;  Surgeon: Jettie Booze, MD;  Location: [facility];  Service: Cardiovascular;  Laterality: N/A;  . TOTAL KNEE ARTHROPLASTY  11/10/2011   Procedure: TOTAL KNEE ARTHROPLASTY;  Surgeon: Mauri Pole, MD;  Location: [facility];  Service: Orthopedics;  Laterality: Right;     reports that he quit smoking about 9 years ago. His smoking use included Cigarettes. He has a 30.00 pack-year smoking history. He has never used smokeless tobacco. He reports that he does not drink alcohol or use drugs.  No Known Allergies  Family History  Problem Relation Age of Onset  . Cancer Mother        breast and  lung  . Cancer Father        bladder  . Cancer Maternal Uncle        prostate  . Cancer Paternal Uncle        esophagus  . Colon cancer Neg Hx      Prior to Admission medications   Medication Sig Start Date End Date Taking? Authorizing Provider  aspirin 81 MG tablet Take 81 mg by mouth daily.    [provider]  calcipotriene (DOVONOX) 0.005 % cream  01/08/17   [provider]  clopidogrel (PLAVIX) 75 MG tablet Take 1 tablet (75 mg total) by mouth daily. 01/23/17   Florencia Reasons, MD  fluticasone (FLONASE) 50 MCG/ACT nasal spray Place 1 spray into both nostrils daily.    [provider]  gabapentin (NEURONTIN) 100 MG capsule Take 100 mg by mouth at bedtime.    [provider]  hydrocortisone 2.5 % cream APPLY TOPICALLY DAILY AS NEEDED TO CATH SITE 02/25/17   Lendon Colonel, NP  insulin aspart (NOVOLOG FLEXPEN) 100 UNIT/ML FlexPen Inject into the skin. Sliding scale As needed for blood sugar    [provider]  insulin degludec (TRESIBA FLEXTOUCH) 100 UNIT/ML SOPN FlexTouch Pen Inject 0.5 mLs (50 Units total) into the skin daily at 10 pm. Patient taking differently: Inject 60 Units into the skin daily at 10 pm.  07/28/16   Kathie Dike, MD  levothyroxine (SYNTHROID, LEVOTHROID) 100 MCG tablet Take 100 mcg by mouth daily before breakfast.    [provider]  levothyroxine (SYNTHROID, LEVOTHROID) 200 MCG tablet Take 200 mcg by mouth daily before breakfast.    [provider]  losartan (COZAAR) 100 MG tablet Take 100 mg by mouth. 02/02/17   [provider]  metoprolol tartrate (LOPRESSOR) 25 MG tablet Take 0.5 tablets (12.5 mg total) by mouth 2 (two) times daily. 01/22/17   Florencia Reasons, MD  montelukast (SINGULAIR) 10 MG tablet Take 10 mg by mouth. 08/07/16   [provider]  nitroGLYCERIN (NITROSTAT) 0.4 MG SL tablet Place 1 tablet (0.4 mg total) under the tongue every 5 (five) minutes as needed for chest pain. Up to 3  doses. If no relief after the 3rd dose, proceed to the ED for an evaluation 02/19/17 05/20/17  Lendon Colonel, NP  omega-3 acid ethyl esters (LOVAZA) 1 g capsule Take 1 capsule by mouth daily.  11/26/16   [provider]  simvastatin (ZOCOR) 5 MG tablet Take 5 mg by mouth daily. 09/14/16   [provider]  traZODone (DESYREL) 50 MG tablet TAKE 1 TO 2 TABLETS AT BEDTIME FOR INSOMNIA 09/30/16   [provider]  verapamil (CALAN-SR) 240 MG CR tablet  01/09/17   [provider]    Physical Exam: Vitals:   03/21/17 0040 03/21/17 0045 03/21/17 0048 03/21/17 0110  BP: (!) 180/86   (!) 198/71  Pulse:  70  72  Resp: 12 13 16 17   Temp:      TempSrc:      SpO2: 97% 98%  97%  Weight:  Height:          Constitutional: NAD, calm, comfortable Vitals:   03/21/17 0040 03/21/17 0045 03/21/17 0048 03/21/17 0110  BP: (!) 180/86   (!) 198/71  Pulse:  70  72  Resp: 12 13 16 17   Temp:      TempSrc:      SpO2: 97% 98%  97%  Weight:      Height:       Eyes: PERRL, lids and conjunctivae normal ENMT: Mucous membranes are moist. Posterior pharynx clear of any exudate or lesions.Normal dentition.  Neck: normal, supple, no masses, no thyromegaly Respiratory: clear to auscultation bilaterally, no wheezing, no crackles. Normal respiratory effort. No accessory muscle use.  Cardiovascular: Regular rate and rhythm, no murmurs / rubs / gallops. No extremity edema. 2+ pedal pulses. No carotid bruits.  Abdomen: no tenderness, no masses palpated. No hepatosplenomegaly. Bowel sounds positive.  Musculoskeletal: no clubbing / cyanosis. No joint deformity upper and lower extremities. Good ROM, no contractures. Normal muscle tone.  Skin: no rashes, lesions, ulcers. No induration Neurologic: CN 2-12 grossly intact. Sensation intact, DTR normal. Strength 5/5 in all 4.  Psychiatric: Normal judgment and insight. Alert and oriented x 3. Normal mood.     Labs on Admission: I have  personally reviewed following labs and imaging studies  CBC:  Recent Labs Lab 03/20/17 2234  WBC 7.5  HGB 14.9  HCT 44.4  MCV 86.5  PLT 884*   Basic Metabolic Panel:  Recent Labs Lab 03/20/17 2234  NA 140  K 3.4*  CL 105  CO2 25  GLUCOSE 157*  BUN 25*  CREATININE 2.33*  CALCIUM 9.1   Cardiac Enzymes:  Recent Labs Lab 03/20/17 2234  TROPONINI 0.07*   Urine analysis:    Component Value Date/Time   COLORURINE YELLOW 01/19/2017 1530   APPEARANCEUR HAZY (A) 01/19/2017 1530   LABSPEC 1.021 01/19/2017 1530   PHURINE 5.0 01/19/2017 1530   GLUCOSEU 150 (A) 01/19/2017 1530   HGBUR SMALL (A) 01/19/2017 1530   BILIRUBINUR NEGATIVE 01/19/2017 [place] 01/19/2017 1530   PROTEINUR 100 (A) 01/19/2017 1530   UROBILINOGEN 4.0 (H) 07/23/2014 1356   NITRITE NEGATIVE 01/19/2017 1530   LEUKOCYTESUR NEGATIVE 01/19/2017 1530   Radiological Exams on Admission: Dg Chest 2 View  Result Date: 03/20/2017 CLINICAL DATA:  Shortness of Breath EXAM: CHEST  2 VIEW COMPARISON:  Chest radiograph January 19, 2017 and chest CT February 05, 2017 FINDINGS: There is no edema or consolidation. Heart is mildly enlarged with pulmonary vascularity within normal limits. No adenopathy. No bone lesions. IMPRESSION: Mild cardiac enlargement.  No edema or consolidation. Electronically Signed   By: Lowella Grip III M.D.   On: 03/20/2017 20:30    EKG: Independently reviewed.  Assessment/Plan Principal Problem:   SOB (shortness of breath) Active Problems:   Chronic kidney disease Stage IV   Morbid obesity (HCC)   Type 2 diabetes mellitus with stage 4 chronic kidney disease (HCC)   Elevated troponin I level   Sleep apnea   Hypothyroidism   HTN (hypertension)   PLAN:   SOB:  I think he had some bronchospasm, and neb has helped him.  Will continue with nebs and continue with oral Zithromax.   We can consider oral prednisone, but he is not wheezing much at all now.   Elevated troponin:   This was the reason for the EDP to request admission, but it is really not a big change from his previous troponin.  Will cycle troponins, doubt ACS.    HTN:  Will give IV labetelol, and resume his meds.  ACE I has been held due to his elevated Cr.  It is also reasonable to admit him obs to get his BP under a little better control.   He wants to go home ASAP.   Hypothyroidism;  contiue with supplements. Check TSH.  DM:  Will do moderate SSI.  Give carb modified diet.    DVT prophylaxis: SCD.  Code Status: FULL CODE.   Family Communication: wife at bedside.  Disposition Plan: Home.  Consults called: Card at [facility] per EDP.  Admission status: OBS.    Ashrita Chrismer MD FACP. [facility]  If 7PM-7AM, please contact night-coverage [email or web address] Password [email or web address]  03/21/2017, 1:55 AM

## 2017-03-29 ENCOUNTER — Encounter (HOSPITAL_COMMUNITY): Payer: Self-pay

## 2017-03-29 ENCOUNTER — Encounter (HOSPITAL_COMMUNITY)
Admission: RE | Admit: 2017-03-29 | Discharge: 2017-03-29 | Disposition: A | Discharge status: Home / Self Care | Payer: Medicare HMO | Source: Ambulatory Visit | Attending: Cardiology | Admitting: Cardiology

## 2017-03-29 VITALS — BP 140/62 | HR 51 | Ht 72.0 in | Wt 312.2 lb

## 2017-03-29 DIAGNOSIS — Z955 Presence of coronary angioplasty implant and graft: Secondary | ICD-10-CM

## 2017-03-29 DIAGNOSIS — I214 Non-ST elevation (NSTEMI) myocardial infarction: Secondary | ICD-10-CM | POA: Insufficient documentation

## 2017-03-29 NOTE — Progress Notes (Signed)
Cardiac/Pulmonary Rehab Medication Review by a Pharmacist  Does the patient  feel that his/her medications are working for him/her?  yes  Has the patient been experiencing any side effects to the medications prescribed?  no  Does the patient measure his/her own blood pressure or blood glucose at home?  yes   Does the patient have any problems obtaining medications due to transportation or finances?   no  Understanding of regimen: fair Understanding of indications: fair Potential of compliance: excellent  Questions asked to Determine Patient Understanding of Medication Regimen:  1. What is the name of the medication?  2. What is the medication used for?  3. When should it be taken?  4. How much should be taken?  5. How will you take it?  6. What side effects should you report?  Understanding Defined as: Excellent: All questions above are correct Good: Questions 1-4 are correct Fair: Questions 1-2 are correct  Poor: 1 or none of the above questions are correct   Pharmacist comments: Pt does not c/o any side effects to medication.  Pt does check BP and blood sugar at home.  Pt states metoprolol recently changed to bisoprolol.    Hart Robinsons A 03/29/2017 1:49 PM

## 2017-03-29 NOTE — Progress Notes (Signed)
6 MIN NUSTEP TEST  Date: 03/29/2017 Weight: 141.6kg Height: 72in     REST   6-MIN   POST 2-MIN HR   51   90   51 BP   140/62   164/66   142/64 O2   92   91   92 RPE   6   11   6  RPD   9   13   13   Distance: 1161 ft. .22 miles  Ex METs: 1.9 Mets  Comments: 17 [place], 84 SPM, 2.19 MPH.

## 2017-03-29 NOTE — Progress Notes (Signed)
Daily Session Note  Patient Details  Name: [name] [name] MRN: [id number] Date of Birth: 08-Jul-1946 Referring Provider:     CARDIAC REHAB PHASE II ORIENTATION from 03/29/2017 in [facility]  Referring Provider  Dr. Beau Fanny      Encounter Date: 03/29/2017  Check In:     Session Check In - 03/29/17 1230      Check-In   Location [facility]   Staff Present Mackenze Grandison Angelina Pih, MS, EP, [facility], Exercise Physiologist;Gregory Luther Parody, BS, EP, Exercise Physiologist;Debra Wynetta Emery, RN, BSN   Supervising physician immediately available to respond to emergencies See telemetry face sheet for immediately available MD   Medication changes reported     No   Fall or balance concerns reported    Yes   Comments Has fallen twice within 12 months   Tobacco Cessation --  Quit 2009   Warm-up and Cool-down Performed as group-led instruction   Resistance Training Performed Yes   VAD Patient? No     Pain Assessment   Currently in Pain? No/denies   Pain Score 0-No pain   Multiple Pain Sites No      Capillary Blood Glucose: No results found for this or any previous visit (from the past 24 hour(s)).    History  Smoking Status  . Former Smoker  . Packs/day: 1.00  . Years: 30.00  . Types: Cigarettes  . Quit date: 08/04/2007  Smokeless Tobacco  . Never Used    Goals Met:  Independence with exercise equipment Exercise tolerated well No report of cardiac concerns or symptoms Strength training completed today  Goals Unmet:  Not Applicable  Comments: Check out 1415   Dr. Kate Sable is Medical Director for [place] and Pulmonary Rehab.

## 2017-03-29 NOTE — Progress Notes (Signed)
Cardiac Individual Treatment Plan  Patient Details  Name: [name] [name] MRN: [id number] Date of Birth: July 21, 1946 Referring Provider:     CARDIAC REHAB PHASE II ORIENTATION from 03/29/2017 in [place]  Referring Provider  Dr. Beau Fanny      Initial Encounter Date:    CARDIAC REHAB PHASE II ORIENTATION from 03/29/2017 in [place]  Date  03/29/17  Referring Provider  Dr. Beau Fanny      Visit Diagnosis: NSTEMI (non-ST elevated myocardial infarction) [facility])  Status post coronary artery stent placement  Patient's Home Medications on Admission:  Current Outpatient Prescriptions:  .  albuterol (PROVENTIL HFA;VENTOLIN HFA) 108 (90 Base) MCG/ACT inhaler, Inhale 2 puffs into the lungs every 6 (six) hours as needed for wheezing or shortness of breath., Disp: 1 Inhaler, Rfl: 2 .  amLODipine (NORVASC) 10 MG tablet, Take 1 tablet (10 mg total) by mouth daily., Disp: 30 tablet, Rfl: 11 .  aspirin 81 MG tablet, Take 81 mg by mouth daily., Disp: , Rfl:  .  bisoprolol (ZEBETA) 5 MG tablet, Take 1 tablet (5 mg total) by mouth daily., Disp: 30 tablet, Rfl: 6 .  calcipotriene (DOVONOX) 0.005 % cream, , Disp: , Rfl:  .  clopidogrel (PLAVIX) 75 MG tablet, Take 1 tablet (75 mg total) by mouth daily., Disp: 30 tablet, Rfl: 0 .  fluticasone (FLONASE) 50 MCG/ACT nasal spray, Place 1 spray into both nostrils daily., Disp: , Rfl:  .  gabapentin (NEURONTIN) 100 MG capsule, Take 100 mg by mouth at bedtime., Disp: , Rfl:  .  hydrocortisone 2.5 % cream, APPLY TOPICALLY DAILY AS NEEDED TO CATH SITE, Disp: 20 g, Rfl: 0 .  insulin aspart (NOVOLOG FLEXPEN) 100 UNIT/ML FlexPen, Inject into the skin. Sliding scale As needed for blood sugar, Disp: , Rfl:  .  insulin degludec (TRESIBA FLEXTOUCH) 100 UNIT/ML SOPN FlexTouch Pen, Inject 0.5 mLs (50 Units total) into the skin daily at 10 pm. (Patient taking differently: Inject 60 Units into the skin daily at 10 pm. ), Disp: , Rfl:  .   levothyroxine (SYNTHROID, LEVOTHROID) 100 MCG tablet, Take 100 mcg by mouth daily before breakfast., Disp: , Rfl:  .  levothyroxine (SYNTHROID, LEVOTHROID) 200 MCG tablet, Take 200 mcg by mouth daily before breakfast., Disp: , Rfl:  .  losartan (COZAAR) 100 MG tablet, Take 100 mg by mouth., Disp: , Rfl:  .  montelukast (SINGULAIR) 10 MG tablet, Take 10 mg by mouth., Disp: , Rfl:  .  nitroGLYCERIN (NITROSTAT) 0.4 MG SL tablet, Place 1 tablet (0.4 mg total) under the tongue every 5 (five) minutes as needed for chest pain. Up to 3 doses. If no relief after the 3rd dose, proceed to the ED for an evaluation, Disp: 25 tablet, Rfl: 3 .  omega-3 acid ethyl esters (LOVAZA) 1 g capsule, Take 1 capsule by mouth daily. , Disp: , Rfl:  .  simvastatin (ZOCOR) 5 MG tablet, Take 5 mg by mouth daily., Disp: , Rfl:  .  traZODone (DESYREL) 50 MG tablet, TAKE 1 TO 2 TABLETS AT BEDTIME FOR INSOMNIA, Disp: , Rfl:   Past Medical History: Past Medical History:  Diagnosis Date  . Cancer (HCC)    low grade non hodgkin's lymphoma  . Chronic kidney disease    stage III kidney disease   . Diabetes mellitus   . Hypothyroidism   . Lymphoma ([place])   . Peripheral vascular disease (HCC)    neuropathy in toes   . Pneumonia  hx of   . S/P biopsy   . Shortness of breath    due ot pain in knees   . Sleep apnea    sleep study 3/29 13 ? location     Tobacco Use: History  Smoking Status  . Former Smoker  . Packs/day: 1.00  . Years: 30.00  . Types: Cigarettes  . Quit date: 08/04/2007  Smokeless Tobacco  . Never Used    Labs: Recent Review Flowsheet Data    Labs for ITP Cardiac and Pulmonary Rehab Latest Ref Rng & Units 07/21/2008 07/22/2008 07/23/2008 07/23/2014 01/20/2017   Cholestrol 0 - 200 mg/dL - 126 ATP III CLASSIFICATION: <200     mg/dL   Desirable 200-239  mg/dL   Borderline High >=240    mg/dL   High 124 ATP III CLASSIFICATION: <200     mg/dL   Desirable 200-239  mg/dL   Borderline High >=240     mg/dL   High - 116   LDLCALC 0 - 99 mg/dL - UNABLE TO CALCULATE IF TRIGLYCERIDE OVER 400 mg/dL Total Cholesterol/HDL:CHD Risk Coronary Heart Disease Risk Table Men   Women 1/2 Average Risk   3.4   3.3 25 Total Cholesterol/HDL:CHD Risk Coronary Heart Disease Risk Table Men   Women 1/2 Average Risk   3.4   3.3 - 50   HDL >40 mg/dL - 27(L) 27(L) - 31(L)   Trlycerides <150 mg/dL - 423(H) 362(H) - 174(H)   Hemoglobin A1c 4.8 - 5.6 % 11.8 (NOTE)   The ADA recommends the following therapeutic goal for glycemic   control related to Hgb A1C measurement:   Goal of Therapy:   < 7.0% Hgb A1C   Reference: American Diabetes Association: Clinical Practice   Recommendations 2008, Diabetes Care, 2008, 31:(Suppl 1).(H) - - SEE SEPARATE REPORT 6.6(H)   TCO2 0 - 100 mmol/L 20 - - - -      Capillary Blood Glucose: Lab Results  Component Value Date   GLUCAP 180 (H) 03/21/2017   GLUCAP 143 (H) 03/21/2017   GLUCAP 167 (H) 03/21/2017   GLUCAP 190 (H) 01/22/2017   GLUCAP 167 (H) 01/22/2017     Exercise Target Goals: Date: 03/29/17  Exercise Program Goal: Individual exercise prescription set with THRR, safety & activity barriers. Participant demonstrates ability to understand and report RPE using BORG scale, to self-measure pulse accurately, and to acknowledge the importance of the exercise prescription.  Exercise Prescription Goal: Starting with aerobic activity 30 plus minutes a day, 3 days per week for initial exercise prescription. Provide home exercise prescription and guidelines that participant acknowledges understanding prior to discharge.  Activity Barriers & Risk Stratification:   6 Minute Walk:  6 MIN NUSTEP TEST  Date: 03/29/2017 Weight: 141.6kg Height: 72in                                      REST                           6-MIN                          POST 2-MIN HR                               51  90                                51 BP                               140/62                         164/66                        142/64 O2                               92                                91                                92 RPE                             6                                  11                                6  RPD                             9                                  13                                13   Distance: 1161 ft. .22 miles  Ex METs: 1.9 Mets  Comments: 17 Watts, 84 SPM, 2.19 MPH.      [place] Name 03/29/17 1414         6 Minute Walk   Phase Initial        Oxygen Initial Assessment:   Oxygen Re-Evaluation:   Oxygen Discharge (Final Oxygen Re-Evaluation):   Initial Exercise Prescription:     Initial Exercise Prescription - 03/29/17 1400      Date of Initial Exercise RX and Referring Provider   Date 03/29/17   Referring Provider Dr. Beau Fanny     NuStep   Level 2   SPM 84   Minutes 15   METs 1.7     Arm Ergometer   Level 1.5   Watts 20   RPM 50   Minutes 20   METs 1.5     Prescription Details   Frequency (times per week) 3   Duration Progress to 30 minutes of continuous aerobic without signs/symptoms of physical distress     Intensity   THRR 40-80% of Max Heartrate [phone]  Ratings of Perceived Exertion 11-13   Perceived Dyspnea 0-4     Progression   Progression Continue progressive overload as per policy without signs/symptoms or physical distress.     Resistance Training   Training Prescription Yes   Weight 1   Reps 10-15      Perform Capillary Blood Glucose checks as needed.  Exercise Prescription Changes:   Exercise Comments:   Exercise Goals and Review:      Exercise Goals    Row Name 03/29/17 1448             Exercise Goals   Increase Physical Activity Yes       Intervention Provide advice, education, support and counseling about physical activity/exercise needs.;Develop an individualized exercise  prescription for aerobic and resistive training based on initial evaluation findings, risk stratification, comorbidities and participant's personal goals.       Expected Outcomes Achievement of increased cardiorespiratory fitness and enhanced flexibility, muscular endurance and strength shown through measurements of functional capacity and personal statement of participant.       Increase Strength and Stamina Yes       Intervention Provide advice, education, support and counseling about physical activity/exercise needs.;Develop an individualized exercise prescription for aerobic and resistive training based on initial evaluation findings, risk stratification, comorbidities and participant's personal goals.       Expected Outcomes Achievement of increased cardiorespiratory fitness and enhanced flexibility, muscular endurance and strength shown through measurements of functional capacity and personal statement of participant.       Able to understand and use rate of perceived exertion (RPE) scale Yes       Intervention Provide education and explanation on how to use RPE scale       Expected Outcomes Short Term: Able to use RPE daily in rehab to express subjective intensity level;Long Term:  Able to use RPE to guide intensity level when exercising independently       Able to understand and use Dyspnea scale Yes       Intervention Provide education and explanation on how to use Dyspnea scale       Expected Outcomes Short Term: Able to use Dyspnea scale daily in rehab to express subjective sense of shortness of breath during exertion;Long Term: Able to use Dyspnea scale to guide intensity level when exercising independently       Knowledge and understanding of Target Heart Rate Range (THRR) Yes       Intervention Provide education and explanation of THRR including how the numbers were predicted and where they are located for reference       Expected Outcomes Short Term: Able to use daily as guideline for  intensity in rehab;Long Term: Able to use THRR to govern intensity when exercising independently       Able to check pulse independently Yes       Intervention Provide education and demonstration on how to check pulse in carotid and radial arteries.;Review the importance of being able to check your own pulse for safety during independent exercise       Expected Outcomes Short Term: Able to explain why pulse checking is important during independent exercise;Long Term: Able to check pulse independently and accurately       Understanding of Exercise Prescription Yes       Intervention Provide education, explanation, and written materials on patient's individual exercise prescription       Expected Outcomes Short Term: Able to explain program exercise prescription;Long Term: Able to explain  home exercise prescription to exercise independently          Exercise Goals Re-Evaluation :    Discharge Exercise Prescription (Final Exercise Prescription Changes):   Nutrition:  Target Goals: Understanding of nutrition guidelines, daily intake of sodium 1500mg , cholesterol 200mg , calories 30% from fat and 7% or less from saturated fats, daily to have 5 or more servings of fruits and vegetables.  Biometrics:     Pre Biometrics - 03/29/17 1415      Pre Biometrics   Height 6' (1.829 m)   Weight (!)  312 lb 2.7 oz (141.6 kg)   Waist Circumference 54 inches   Hip Circumference 52 inches   Waist to Hip Ratio 1.04 %   BMI (Calculated) 42.33   Triceps Skinfold 17 mm   % Body Fat 39.6 %   Grip Strength 78.67 kg   Flexibility 0 in   Single Leg Stand 0 seconds       Nutrition Therapy Plan and Nutrition Goals:     Nutrition Therapy & Goals - 03/29/17 1457      Personal Nutrition Goals   Nutrition Goal To continue to eat heart healthy diet.    Additional Goals? No     Intervention Plan   Expected Outcomes Short Term Goal: A plan has been developed with personal nutrition goals set during  dietitian appointment.;Long Term Goal: Adherence to prescribed nutrition plan.      Nutrition Discharge: Rate Your Plate Scores:     Nutrition Assessments - 03/29/17 1459      MEDFICTS Scores   Pre Score 62      Nutrition Goals Re-Evaluation:   Nutrition Goals Discharge (Final Nutrition Goals Re-Evaluation):   Psychosocial: Target Goals: Acknowledge presence or absence of significant depression and/or stress, maximize coping skills, provide positive support system. Participant is able to verbalize types and ability to use techniques and skills needed for reducing stress and depression.  Initial Review & Psychosocial Screening:     Initial Psych Review & Screening - 03/29/17 1500      Initial Review   Current issues with None Identified     Family Dynamics   Good Support System? Yes     Barriers   Psychosocial barriers to participate in program There are no identifiable barriers or psychosocial needs.     Screening Interventions   Interventions Encouraged to exercise      Quality of Life Scores:     Quality of Life - 03/29/17 1416      Quality of Life Scores   Health/Function Pre 12.9 %   Socioeconomic Pre 18.75 %   Psych/Spiritual Pre 18.64 %   Family Pre 15.6 %   GLOBAL Pre 15.59 %      PHQ-9: Recent Review Flowsheet Data    Depression screen [facility] 2/9 03/29/2017 08/19/2015 08/19/2015   Decreased Interest 0 0 0   Down, Depressed, Hopeless 0 0 0   PHQ - 2 Score 0 0 0   Altered sleeping 1 - -   Tired, decreased energy 2 - -   Change in appetite 0 - -   Feeling bad or failure about yourself  0 - -   Trouble concentrating 0 - -   Moving slowly or fidgety/restless 0 - -   Suicidal thoughts 0 - -   PHQ-9 Score 3 - -   Difficult doing work/chores Somewhat difficult - -     Interpretation of Total Score  Total Score Depression Severity:  1-4 = Minimal  depression, 5-9 = Mild depression, 10-14 = Moderate depression, 15-19 = Moderately severe depression,  20-27 = Severe depression   Psychosocial Evaluation and Intervention:     Psychosocial Evaluation - 03/29/17 1501      Psychosocial Evaluation & Interventions   Interventions Encouraged to exercise with the program and follow exercise prescription   Continue Psychosocial Services  No Follow up required      Psychosocial Re-Evaluation:   Psychosocial Discharge (Final Psychosocial Re-Evaluation):   Vocational Rehabilitation: Provide vocational rehab assistance to qualifying candidates.   Vocational Rehab Evaluation & Intervention:     Vocational Rehab - 03/29/17 1447      Initial Vocational Rehab Evaluation & Intervention   Assessment shows need for Vocational Rehabilitation No      Education: Education Goals: Education classes will be provided on a weekly basis, covering required topics. Participant will state understanding/return demonstration of topics presented.  Learning Barriers/Preferences:     Learning Barriers/Preferences - 03/29/17 1444      Learning Barriers/Preferences   Learning Barriers None   Learning Preferences Pictoral;Written Material;Computer/Internet      Education Topics: Hypertension, Hypertension Reduction -Define heart disease and high blood pressure. Discus how high blood pressure affects the body and ways to reduce high blood pressure.   Exercise and Your Heart -Discuss why it is important to exercise, the FITT principles of exercise, normal and abnormal responses to exercise, and how to exercise safely.   Angina -Discuss definition of angina, causes of angina, treatment of angina, and how to decrease risk of having angina.   Cardiac Medications -Review what the following cardiac medications are used for, how they affect the body, and side effects that may occur when taking the medications.  Medications include Aspirin, Beta blockers, calcium channel blockers, ACE Inhibitors, angiotensin receptor blockers, diuretics, digoxin, and  antihyperlipidemics.   Congestive Heart Failure -Discuss the definition of CHF, how to live with CHF, the signs and symptoms of CHF, and how keep track of weight and sodium intake.   Heart Disease and Intimacy -Discus the effect sexual activity has on the heart, how changes occur during intimacy as we age, and safety during sexual activity.   Smoking Cessation / COPD -Discuss different methods to quit smoking, the health benefits of quitting smoking, and the definition of COPD.   Nutrition I: Fats -Discuss the types of cholesterol, what cholesterol does to the heart, and how cholesterol levels can be controlled.   Nutrition II: Labels -Discuss the different components of food labels and how to read food label   Heart Parts and Heart Disease -Discuss the anatomy of the heart, the pathway of blood circulation through the heart, and these are affected by heart disease.   Stress I: Signs and Symptoms -Discuss the causes of stress, how stress may lead to anxiety and depression, and ways to limit stress.   Stress II: Relaxation -Discuss different types of relaxation techniques to limit stress.   Warning Signs of Stroke / TIA -Discuss definition of a stroke, what the signs and symptoms are of a stroke, and how to identify when someone is having stroke.   Knowledge Questionnaire Score:     Knowledge Questionnaire Score - 03/29/17 1445      Knowledge Questionnaire Score   Pre Score 19/24      Core Components/Risk Factors/Patient Goals at Admission:     Personal Goals and Risk Factors at Admission - 03/29/17 1459      Core Components/Risk Factors/Patient Goals on Admission  Weight Management Obesity   Improve shortness of breath with ADL's Yes   Intervention Provide education, individualized exercise plan and daily activity instruction to help decrease symptoms of SOB with activities of daily living.   Expected Outcomes Short Term: Achieves a reduction of symptoms  when performing activities of daily living.   Personal Goal Other Yes   Personal Goal Keep living   Intervention Attend CR 3 x week and supplement exercise 2 x week at home   Expected Outcomes Achieve personal goals.       Core Components/Risk Factors/Patient Goals Review:    Core Components/Risk Factors/Patient Goals at Discharge (Final Review):    ITP Comments:     ITP Comments    Row Name 03/29/17 1456           ITP Comments Mr. Ferrufino is a 71 year old male with PVD, and extreme SOB with exertion. He has been to the doctor about his SOB.           Comments: Patient arrived for 1st visit/orientation/education at 1230. Patient was referred to CR by Dr. Irish Lack due to NSTEMI (I21.4) and Stent Placement (Z95.5). During orientation advised patient on arrival and appointment times what to wear, what to do before, during and after exercise. Reviewed attendance and class policy. Talked about inclement weather and class consultation policy. Pt is scheduled to return Cardiac Rehab on 04/07/17 at 0930. Pt was advised to come to class 15 minutes before class starts. Patient was also given instructions on meeting with the dietician and attending the Family Structure classes. Pt is eager to get started. Patient participated in warm up stretches followed by light weights and resistance bands. Patient was not able to complete 6 minute walk test due to knee pain. Switched to 6 minute Nustep test. He c/o bilateral knee pain at check in 2/10. Pain increased to 6/10 during Nustep test. Pain was 4/10 during 2 minute rest. Pain was back down to 2/10 at end of orientation. Patient was measured for the equipment. Discussed equipment safety with patient. Took patient pre-anthropometric measurements. Patient finished visit at 1415.

## 2017-03-31 ENCOUNTER — Observation Stay (HOSPITAL_COMMUNITY)
Admission: EM | Admit: 2017-03-31 | Discharge: 2017-04-02 | Disposition: A | Discharge status: Home / Self Care | Payer: Medicare HMO | Attending: Internal Medicine | Admitting: Internal Medicine

## 2017-03-31 ENCOUNTER — Other Ambulatory Visit: Payer: Self-pay

## 2017-03-31 ENCOUNTER — Encounter (HOSPITAL_COMMUNITY): Payer: Self-pay

## 2017-03-31 DIAGNOSIS — I501 Left ventricular failure: Secondary | ICD-10-CM | POA: Diagnosis not present

## 2017-03-31 DIAGNOSIS — I129 Hypertensive chronic kidney disease with stage 1 through stage 4 chronic kidney disease, or unspecified chronic kidney disease: Secondary | ICD-10-CM | POA: Diagnosis not present

## 2017-03-31 DIAGNOSIS — I252 Old myocardial infarction: Secondary | ICD-10-CM | POA: Diagnosis not present

## 2017-03-31 DIAGNOSIS — Z87891 Personal history of nicotine dependence: Secondary | ICD-10-CM | POA: Diagnosis not present

## 2017-03-31 DIAGNOSIS — E039 Hypothyroidism, unspecified: Secondary | ICD-10-CM | POA: Diagnosis present

## 2017-03-31 DIAGNOSIS — I5043 Acute on chronic combined systolic (congestive) and diastolic (congestive) heart failure: Secondary | ICD-10-CM | POA: Diagnosis not present

## 2017-03-31 DIAGNOSIS — Z7982 Long term (current) use of aspirin: Secondary | ICD-10-CM | POA: Insufficient documentation

## 2017-03-31 DIAGNOSIS — Z7902 Long term (current) use of antithrombotics/antiplatelets: Secondary | ICD-10-CM | POA: Diagnosis not present

## 2017-03-31 DIAGNOSIS — N184 Chronic kidney disease, stage 4 (severe): Secondary | ICD-10-CM | POA: Diagnosis not present

## 2017-03-31 DIAGNOSIS — R0602 Shortness of breath: Secondary | ICD-10-CM | POA: Diagnosis present

## 2017-03-31 DIAGNOSIS — Z794 Long term (current) use of insulin: Secondary | ICD-10-CM | POA: Diagnosis not present

## 2017-03-31 DIAGNOSIS — N189 Chronic kidney disease, unspecified: Secondary | ICD-10-CM | POA: Diagnosis present

## 2017-03-31 DIAGNOSIS — Z79899 Other long term (current) drug therapy: Secondary | ICD-10-CM | POA: Insufficient documentation

## 2017-03-31 DIAGNOSIS — R7989 Other specified abnormal findings of blood chemistry: Secondary | ICD-10-CM | POA: Diagnosis not present

## 2017-03-31 DIAGNOSIS — E1122 Type 2 diabetes mellitus with diabetic chronic kidney disease: Secondary | ICD-10-CM | POA: Diagnosis not present

## 2017-03-31 DIAGNOSIS — E1140 Type 2 diabetes mellitus with diabetic neuropathy, unspecified: Secondary | ICD-10-CM | POA: Diagnosis present

## 2017-03-31 DIAGNOSIS — I2510 Atherosclerotic heart disease of native coronary artery without angina pectoris: Secondary | ICD-10-CM | POA: Diagnosis not present

## 2017-03-31 DIAGNOSIS — G4730 Sleep apnea, unspecified: Secondary | ICD-10-CM | POA: Diagnosis not present

## 2017-03-31 DIAGNOSIS — I10 Essential (primary) hypertension: Secondary | ICD-10-CM | POA: Diagnosis present

## 2017-03-31 DIAGNOSIS — R778 Other specified abnormalities of plasma proteins: Secondary | ICD-10-CM | POA: Diagnosis present

## 2017-03-31 HISTORY — DX: Atherosclerotic heart disease of native coronary artery without angina pectoris: I25.10

## 2017-03-31 HISTORY — DX: Hyperlipidemia, unspecified: E78.5

## 2017-03-31 HISTORY — DX: Morbid (severe) obesity due to excess calories: E66.01

## 2017-03-31 NOTE — ED Triage Notes (Signed)
Pt states he was here last week for the same complaint of sob, states he stayed overnight in the hospital but was not told was the cause of his sob.  Pt denies cp.

## 2017-04-01 ENCOUNTER — Emergency Department (HOSPITAL_COMMUNITY): Payer: Medicare HMO

## 2017-04-01 ENCOUNTER — Encounter (HOSPITAL_COMMUNITY): Payer: Self-pay | Admitting: Internal Medicine

## 2017-04-01 DIAGNOSIS — I5043 Acute on chronic combined systolic (congestive) and diastolic (congestive) heart failure: Secondary | ICD-10-CM | POA: Diagnosis present

## 2017-04-01 DIAGNOSIS — I2510 Atherosclerotic heart disease of native coronary artery without angina pectoris: Secondary | ICD-10-CM

## 2017-04-01 LAB — GLUCOSE, CAPILLARY
GLUCOSE-CAPILLARY: 155 mg/dL — AB (ref 65–99)
GLUCOSE-CAPILLARY: 127 mg/dL — AB (ref 65–99)
GLUCOSE-CAPILLARY: 167 mg/dL — AB (ref 65–99)
Glucose-Capillary: 126 mg/dL — ABNORMAL HIGH (ref 65–99)

## 2017-04-01 LAB — BASIC METABOLIC PANEL
ANION GAP: 6 (ref 5–15)
Anion gap: 10 (ref 5–15)
BUN: 31 mg/dL — ABNORMAL HIGH (ref 6–20)
BUN: 32 mg/dL — AB (ref 6–20)
CALCIUM: 8.7 mg/dL — AB (ref 8.9–10.3)
CALCIUM: 8.7 mg/dL — AB (ref 8.9–10.3)
CO2: 24 mmol/L (ref 22–32)
CO2: 25 mmol/L (ref 22–32)
CREATININE: 2.16 mg/dL — AB (ref 0.61–1.24)
Chloride: 105 mmol/L (ref 101–111)
Chloride: 108 mmol/L (ref 101–111)
Creatinine, Ser: 2.33 mg/dL — ABNORMAL HIGH (ref 0.61–1.24)
GFR calc Af Amer: 31 mL/min — ABNORMAL LOW (ref >60)
GFR calc Af Amer: 34 mL/min — ABNORMAL LOW (ref >60)
GFR, EST NON AFRICAN AMERICAN: 27 mL/min — AB (ref >60)
GFR, EST NON AFRICAN AMERICAN: 29 mL/min — AB (ref >60)
GLUCOSE: 143 mg/dL — AB (ref 65–99)
GLUCOSE: 146 mg/dL — AB (ref 65–99)
Potassium: 3.6 mmol/L (ref 3.5–5.1)
Potassium: 3.6 mmol/L (ref 3.5–5.1)
Sodium: 138 mmol/L (ref 135–145)
Sodium: 140 mmol/L (ref 135–145)

## 2017-04-01 LAB — CBC WITH DIFFERENTIAL/PLATELET
BASOS ABS: 0.1 10*3/uL (ref 0.0–0.1)
Basophils Relative: 1 %
EOS PCT: 3 %
Eosinophils Absolute: 0.3 10*3/uL (ref 0.0–0.7)
HEMATOCRIT: 44.1 % (ref 39.0–52.0)
Hemoglobin: 14.9 g/dL (ref 13.0–17.0)
Lymphocytes Relative: 12 %
Lymphs Abs: 1.1 10*3/uL (ref 0.7–4.0)
MCH: 29.6 pg (ref 26.0–34.0)
MCHC: 33.8 g/dL (ref 30.0–36.0)
MCV: 87.5 fL (ref 78.0–100.0)
MONO ABS: 0.9 10*3/uL (ref 0.1–1.0)
MONOS PCT: 9 %
NEUTROS ABS: 7.1 10*3/uL (ref 1.7–7.7)
Neutrophils Relative %: 75 %
PLATELETS: 137 10*3/uL — AB (ref 150–400)
RBC: 5.04 MIL/uL (ref 4.22–5.81)
RDW: 16.1 % — AB (ref 11.5–15.5)
WBC: 9.5 10*3/uL (ref 4.0–10.5)

## 2017-04-01 LAB — APTT: aPTT: 35 seconds (ref 24–36)

## 2017-04-01 LAB — TROPONIN I
TROPONIN I: 0.05 ng/mL — AB (ref <0.03)
TROPONIN I: 0.04 ng/mL — AB (ref <0.03)
Troponin I: 0.05 ng/mL (ref <0.03)
Troponin I: 0.05 ng/mL (ref <0.03)

## 2017-04-01 LAB — D-DIMER, QUANTITATIVE: D-Dimer, Quant: 0.45 ug/mL-FEU (ref 0.00–0.50)

## 2017-04-01 LAB — MAGNESIUM: Magnesium: 2.3 mg/dL (ref 1.7–2.4)

## 2017-04-01 LAB — PROTIME-INR
INR: 1.04
Prothrombin Time: 13.6 seconds (ref 11.4–15.2)

## 2017-04-01 LAB — BRAIN NATRIURETIC PEPTIDE: B Natriuretic Peptide: 288 pg/mL — ABNORMAL HIGH (ref 0.0–100.0)

## 2017-04-01 MED ORDER — LEVOTHYROXINE SODIUM 100 MCG PO TABS
100.0000 ug | ORAL_TABLET | Freq: Every day | ORAL | Status: DC
  Administered 2017-04-01 – 2017-04-02 (×2): 100 ug via ORAL
  Filled 2017-04-01 (×2): qty 1

## 2017-04-01 MED ORDER — GABAPENTIN 100 MG PO CAPS
100.0000 mg | ORAL_CAPSULE | Freq: Every day | ORAL | Status: DC
  Administered 2017-04-01: 100 mg via ORAL
  Filled 2017-04-01: qty 1

## 2017-04-01 MED ORDER — CLOPIDOGREL BISULFATE 75 MG PO TABS
75.0000 mg | ORAL_TABLET | Freq: Every day | ORAL | Status: DC
  Administered 2017-04-01: 75 mg via ORAL
  Filled 2017-04-01: qty 1

## 2017-04-01 MED ORDER — IPRATROPIUM-ALBUTEROL 0.5-2.5 (3) MG/3ML IN SOLN
3.0000 mL | RESPIRATORY_TRACT | Status: DC | PRN
  Administered 2017-04-01: 3 mL via RESPIRATORY_TRACT
  Filled 2017-04-01: qty 3

## 2017-04-01 MED ORDER — ISOSORBIDE MONONITRATE ER 60 MG PO TB24
30.0000 mg | ORAL_TABLET | Freq: Every day | ORAL | Status: DC
  Administered 2017-04-01: 30 mg via ORAL
  Filled 2017-04-01 (×2): qty 1

## 2017-04-01 MED ORDER — INSULIN ASPART 100 UNIT/ML ~~LOC~~ SOLN
0.0000 [IU] | Freq: Three times a day (TID) | SUBCUTANEOUS | Status: DC
  Administered 2017-04-01 (×2): 3 [IU] via SUBCUTANEOUS
  Administered 2017-04-01: 4 [IU] via SUBCUTANEOUS

## 2017-04-01 MED ORDER — ENOXAPARIN SODIUM 40 MG/0.4ML ~~LOC~~ SOLN
40.0000 mg | SUBCUTANEOUS | Status: DC
  Administered 2017-04-01 – 2017-04-02 (×2): 40 mg via SUBCUTANEOUS
  Filled 2017-04-01: qty 0.4

## 2017-04-01 MED ORDER — TRAZODONE HCL 50 MG PO TABS
50.0000 mg | ORAL_TABLET | Freq: Every day | ORAL | Status: DC
Start: 2017-04-01 — End: 2017-04-02
  Administered 2017-04-01: 50 mg via ORAL
  Filled 2017-04-01: qty 1

## 2017-04-01 MED ORDER — ASPIRIN EC 81 MG PO TBEC
81.0000 mg | DELAYED_RELEASE_TABLET | Freq: Every day | ORAL | Status: DC
  Administered 2017-04-01: 81 mg via ORAL
  Filled 2017-04-01: qty 1

## 2017-04-01 MED ORDER — FUROSEMIDE 10 MG/ML IJ SOLN
60.0000 mg | Freq: Two times a day (BID) | INTRAMUSCULAR | Status: DC
  Administered 2017-04-01 – 2017-04-02 (×3): 60 mg via INTRAVENOUS
  Filled 2017-04-01 (×3): qty 6

## 2017-04-01 MED ORDER — ORAL CARE MOUTH RINSE
15.0000 mL | Freq: Two times a day (BID) | OROMUCOSAL | Status: DC
  Administered 2017-04-01 (×2): 15 mL via OROMUCOSAL

## 2017-04-01 MED ORDER — INSULIN DEGLUDEC 100 UNIT/ML ~~LOC~~ SOPN: 60.0000 [IU] | PEN_INJECTOR | Freq: Every day | SUBCUTANEOUS | Status: DC

## 2017-04-01 MED ORDER — NITROGLYCERIN 0.4 MG SL SUBL: 0.4000 mg | SUBLINGUAL_TABLET | SUBLINGUAL | Status: DC | PRN

## 2017-04-01 MED ORDER — INSULIN GLARGINE 100 UNIT/ML ~~LOC~~ SOLN
60.0000 [IU] | Freq: Every day | SUBCUTANEOUS | Status: DC
  Administered 2017-04-01: 60 [IU] via SUBCUTANEOUS
  Filled 2017-04-01 (×4): qty 0.6

## 2017-04-01 MED ORDER — POTASSIUM CHLORIDE CRYS ER 20 MEQ PO TBCR
40.0000 meq | EXTENDED_RELEASE_TABLET | Freq: Every day | ORAL | Status: DC
  Administered 2017-04-01: 40 meq via ORAL
  Filled 2017-04-01 (×2): qty 2

## 2017-04-01 MED ORDER — MONTELUKAST SODIUM 10 MG PO TABS
10.0000 mg | ORAL_TABLET | Freq: Every day | ORAL | Status: DC
  Administered 2017-04-01: 10 mg via ORAL
  Filled 2017-04-01: qty 1

## 2017-04-01 MED ORDER — AMLODIPINE BESYLATE 5 MG PO TABS
10.0000 mg | ORAL_TABLET | Freq: Every day | ORAL | Status: DC
  Administered 2017-04-01: 10 mg via ORAL
  Filled 2017-04-01: qty 2

## 2017-04-01 MED ORDER — BISOPROLOL FUMARATE 5 MG PO TABS
5.0000 mg | ORAL_TABLET | Freq: Every day | ORAL | Status: DC
  Administered 2017-04-01: 5 mg via ORAL
  Filled 2017-04-01: qty 1

## 2017-04-01 MED ORDER — IPRATROPIUM-ALBUTEROL 0.5-2.5 (3) MG/3ML IN SOLN
3.0000 mL | Freq: Once | RESPIRATORY_TRACT | Status: AC
  Administered 2017-04-01: 3 mL via RESPIRATORY_TRACT
  Filled 2017-04-01: qty 3

## 2017-04-01 MED ORDER — LEVOTHYROXINE SODIUM 100 MCG PO TABS
200.0000 ug | ORAL_TABLET | Freq: Every day | ORAL | Status: DC
  Administered 2017-04-01 – 2017-04-02 (×2): 200 ug via ORAL
  Filled 2017-04-01 (×2): qty 2

## 2017-04-01 MED ORDER — FUROSEMIDE 10 MG/ML IJ SOLN
40.0000 mg | Freq: Once | INTRAMUSCULAR | Status: AC
  Administered 2017-04-01: 40 mg via INTRAVENOUS
  Filled 2017-04-01: qty 4

## 2017-04-01 MED ORDER — FLUTICASONE PROPIONATE 50 MCG/ACT NA SUSP
1.0000 | Freq: Every day | NASAL | Status: DC
  Filled 2017-04-01: qty 16

## 2017-04-01 MED ORDER — LOSARTAN POTASSIUM 50 MG PO TABS
100.0000 mg | ORAL_TABLET | Freq: Every day | ORAL | Status: DC
  Administered 2017-04-01: 100 mg via ORAL
  Filled 2017-04-01: qty 2

## 2017-04-01 MED ORDER — SIMVASTATIN 10 MG PO TABS
5.0000 mg | ORAL_TABLET | Freq: Every day | ORAL | Status: DC
  Administered 2017-04-01: 5 mg via ORAL
  Filled 2017-04-01: qty 1

## 2017-04-01 MED ORDER — HYDRALAZINE HCL 20 MG/ML IJ SOLN: 10.0000 mg | INTRAMUSCULAR | Status: DC | PRN

## 2017-04-01 NOTE — H&P (Signed)
History and Physical    [name] [name] OZH:[id number] DOB: 07-07-1946 DOA: 03/31/2017  PCP: [name]   Patient coming from: Home.  I have personally briefly reviewed patient's old medical records in [place]  Chief Complaint: Shortness of breath.  HPI: [name] [name] is a 71 y.o. male with medical history significant of CAD,'S/P NSTEMI in June 2018, low-grade non-Hodgkin lymphoma, type 2 diabetes, diabetic peripheral neuropathy, chronic kidney disease, hyperlipidemia, hypertension, hypothyroidism, peripheral vascular disease, morbid obesity, sleep apnea (not on CPAP), history of pneumonia who was recently admitted and discharged from 08/18 to 03/21/2017 due to congestive heart failure and is returning to the emergency department today with complaints of worsening shortness of breath after going to bed yesterday evening. He complains of nonproductive cough, mild lower extremity edema and orthopnea. He also has had a 12 pound weight gain since he was discharged. He denies fever, chills, but complains of fatigue. He denies rhinorrhea, sore throat, hemoptysis, chest pain, dizziness, palpitations, diaphoresis, abdominal pain, diarrhea, melena or hematochezia. He mentions that his constipation has improved since he stopped verapamil. He denies dysuria, frequency, hematuria, polyuria, polydipsia, polyphagia or blurred vision. No significant rashes or pruritus.  ED Course: Initial vital signs in the emergency department were temperature 36.7C, pulse 55, blood pressure 160/72 mmHg, respirations 19 and O2 sat 89% on room air. His O2 sat improved to 94% on 2 LPM via nasal cannula. His workup shows an elevated BNP at 288 pg/mL, troponin mildly elevated at 0.05 ng/mL. EKG is sinus rhythm with first-degree AV block and previous LBBB. No significant change from last tracing. Hemoglobin was 14.9 g/dL, WBC 9.5 and platelets 137. PT/INR/PTT were within normal limits. Sodium 138,  potassium 3.6, chloride 108 and bicarbonate 24 mmol/L. BUN was 31, creatinine 2.16, magnesium 2.3 and glucose 133 mg/dL. His chest radiograph showed small bilateral pleural effusion and increased interstitial markings likely due to interstitial edema. Please see images and full radiology report for further detail.   Besides supplemental oxygen, the patient also received a DuoNeb and 40 mg of furosemide IVP. He mentions that he feels better.  Review of Systems: As per HPI otherwise 10 point review of systems negative.    Past Medical History:  Diagnosis Date  . CAD (coronary artery disease)    S/P NSTEMI June 2018  . Cancer (HCC)    low grade non hodgkin's lymphoma  . Chronic kidney disease    stage III kidney disease   . Diabetes mellitus   . Hyperlipidemia   . Hypertension   . Hypothyroidism   . Lymphoma ([place])   . Morbid obesity ([place])   . Peripheral vascular disease (HCC)    neuropathy in toes   . Pneumonia    hx of   . S/P biopsy   . Shortness of breath    due ot pain in knees   . Sleep apnea    sleep study 3/29 13 ? location     Past Surgical History:  Procedure Laterality Date  . CHOLECYSTECTOMY  1992  . COLONOSCOPY N/A 09/03/2014   SLF:six colon polyps removed/small internal hemorrhoids  . CORONARY STENT INTERVENTION N/A 01/21/2017   Procedure: Coronary Stent Intervention;  Surgeon: Nelva Bush, MD;  Location: [facility];  Service: Cardiovascular;  Laterality: N/A;  . ESOPHAGOGASTRODUODENOSCOPY N/A 09/03/2014   SLF: mild gastritis/few gastric polyps  . LEFT HEART CATH AND CORONARY ANGIOGRAPHY N/A 01/20/2017   Procedure: Left Heart Cath and Coronary Angiography;  Surgeon:  Jettie Booze, MD;  Location: [facility];  Service: Cardiovascular;  Laterality: N/A;  . TOTAL KNEE ARTHROPLASTY  11/10/2011   Procedure: TOTAL KNEE ARTHROPLASTY;  Surgeon: Mauri Pole, MD;  Location: [facility];  Service: Orthopedics;  Laterality: Right;     reports that he quit  smoking about 9 years ago. His smoking use included Cigarettes. He has a 30.00 pack-year smoking history. He has never used smokeless tobacco. He reports that he does not drink alcohol or use drugs.  No Known Allergies  Family History  Problem Relation Age of Onset  . Cancer Mother        breast and lung  . Cancer Father        bladder  . Cancer Maternal Uncle        prostate  . Cancer Paternal Uncle        esophagus  . Colon cancer Neg Hx     Prior to Admission medications   Medication Sig Start Date End Date Taking? Authorizing Provider  albuterol (PROVENTIL HFA;VENTOLIN HFA) 108 (90 Base) MCG/ACT inhaler Inhale 2 puffs into the lungs every 6 (six) hours as needed for wheezing or shortness of breath. 03/21/17   Waldron Session, MD  amLODipine (NORVASC) 10 MG tablet Take 1 tablet (10 mg total) by mouth daily. 03/21/17 03/21/18  Waldron Session, MD  aspirin 81 MG tablet Take 81 mg by mouth daily.    [provider]  bisoprolol (ZEBETA) 5 MG tablet Take 1 tablet (5 mg total) by mouth daily. 03/22/17   Waldron Session, MD  calcipotriene (DOVONOX) 0.005 % cream  01/08/17   [provider]  clopidogrel (PLAVIX) 75 MG tablet Take 1 tablet (75 mg total) by mouth daily. 01/23/17   Florencia Reasons, MD  fluticasone (FLONASE) 50 MCG/ACT nasal spray Place 1 spray into both nostrils daily.    [provider]  gabapentin (NEURONTIN) 100 MG capsule Take 100 mg by mouth at bedtime.    [provider]  hydrocortisone 2.5 % cream APPLY TOPICALLY DAILY AS NEEDED TO CATH SITE 02/25/17   Lendon Colonel, NP  insulin aspart (NOVOLOG FLEXPEN) 100 UNIT/ML FlexPen Inject into the skin. Sliding scale As needed for blood sugar    [provider]  insulin degludec (TRESIBA FLEXTOUCH) 100 UNIT/ML SOPN FlexTouch Pen Inject 0.5 mLs (50 Units total) into the skin daily at 10 pm. Patient taking differently: Inject 60 Units into the skin daily at 10 pm.  07/28/16   Kathie Dike, MD    levothyroxine (SYNTHROID, LEVOTHROID) 100 MCG tablet Take 100 mcg by mouth daily before breakfast.    [provider]  levothyroxine (SYNTHROID, LEVOTHROID) 200 MCG tablet Take 200 mcg by mouth daily before breakfast.    [provider]  losartan (COZAAR) 100 MG tablet Take 100 mg by mouth. 02/02/17   [provider]  montelukast (SINGULAIR) 10 MG tablet Take 10 mg by mouth. 08/07/16   [provider]  nitroGLYCERIN (NITROSTAT) 0.4 MG SL tablet Place 1 tablet (0.4 mg total) under the tongue every 5 (five) minutes as needed for chest pain. Up to 3 doses. If no relief after the 3rd dose, proceed to the ED for an evaluation 03/21/17 06/19/17  Waldron Session, MD  omega-3 acid ethyl esters (LOVAZA) 1 g capsule Take 1 capsule by mouth daily.  11/26/16   [provider]  simvastatin (ZOCOR) 5 MG tablet Take 5 mg by mouth daily. 09/14/16   [provider]  traZODone (DESYREL) 50 MG tablet TAKE 1 TO 2 TABLETS AT BEDTIME FOR INSOMNIA 09/30/16   [provider]    Physical Exam: Vitals:   04/01/17 0100 04/01/17 0130 04/01/17 0200 04/01/17 0255  BP: (!) 173/74 (!) 161/76 (!) 161/89 (!) 153/66  Pulse: (!) 56 (!) 52 (!) 57 (!) 51  Resp: 15 19 18    Temp:    98.5 F (36.9 C)  TempSrc:    Oral  SpO2: 97% 98% 97% 98%  Weight:      Height:        Constitutional: NAD, calm, comfortable Eyes: PERRL, lids and conjunctivae normal ENMT: Mucous membranes are moist. Posterior pharynx clear of any exudate or lesions. Neck: normal, supple, no masses, no thyromegaly Respiratory: Bibasilar rales, no wheezing.. Normal respiratory effort. No accessory muscle use.  Cardiovascular: Regular rate and rhythm, no murmurs / rubs / gallops. Trace bilateral lower extremity edema. 2+ pedal pulses. No carotid bruits.  Abdomen: Obese, soft, no tenderness, no masses palpated. No hepatosplenomegaly. Bowel sounds positive.  Musculoskeletal: no clubbing / cyanosis. Good ROM, no  contractures. Normal muscle tone.  Skin: no significant rashes, lesions, ulcers on limited skin exam Neurologic: CN 2-12 grossly intact. Sensation intact, DTR normal. Strength 5/5 in all 4.  Psychiatric: Normal judgment and insight. Alert and oriented x 4. Normal mood.    Labs on Admission: I have personally reviewed following labs and imaging studies  CBC:  Recent Labs Lab 04/01/17 0005  WBC 9.5  NEUTROABS 7.1  HGB 14.9  HCT 44.1  MCV 87.5  PLT 144*   Basic Metabolic Panel:  Recent Labs Lab 04/01/17 0005 04/01/17 0200  NA 138  --   K 3.6  --   CL 108  --   CO2 24  --   GLUCOSE 143*  --   BUN 31*  --   CREATININE 2.16*  --   CALCIUM 8.7*  --   MG  --  2.3   GFR: Estimated Creatinine Clearance: 47.1 mL/min (A) (by C-G formula based on SCr of 2.16 mg/dL (H)). Liver Function Tests: No results for input(s): AST, ALT, ALKPHOS, BILITOT, PROT, ALBUMIN in the last 168 hours. No results for input(s): LIPASE, AMYLASE in the last 168 hours. No results for input(s): AMMONIA in the last 168 hours. Coagulation Profile:  Recent Labs Lab 04/01/17 0200  INR 1.04   Cardiac Enzymes:  Recent Labs Lab 04/01/17 0005  TROPONINI 0.05*   BNP (last 3 results) No results for input(s): PROBNP in the last 8760 hours. HbA1C: No results for input(s): HGBA1C in the last 72 hours. CBG: No results for input(s): GLUCAP in the last 168 hours. Lipid Profile: No results for input(s): CHOL, HDL, LDLCALC, TRIG, CHOLHDL, LDLDIRECT in the last 72 hours. Thyroid Function Tests: No results for input(s): TSH, T4TOTAL, FREET4, T3FREE, THYROIDAB in the last 72 hours. Anemia Panel: No results for input(s): VITAMINB12, FOLATE, FERRITIN, TIBC, IRON, RETICCTPCT in the last 72 hours. Urine analysis:    Component Value Date/Time   COLORURINE YELLOW 01/19/2017 1530   APPEARANCEUR HAZY (A) 01/19/2017 1530   LABSPEC 1.021 01/19/2017 1530   PHURINE 5.0 01/19/2017 1530   GLUCOSEU 150 (A) 01/19/2017  1530   HGBUR SMALL (A) 01/19/2017 1530   BILIRUBINUR NEGATIVE 01/19/2017 1530   KETONESUR NEGATIVE 01/19/2017 1530   PROTEINUR 100 (A) 01/19/2017 1530   UROBILINOGEN 4.0 (H) 07/23/2014 1356   NITRITE NEGATIVE 01/19/2017 1530   LEUKOCYTESUR NEGATIVE 01/19/2017 1530    Radiological Exams on  Admission: Dg Chest 2 View  Result Date: 04/01/2017 CLINICAL DATA:  Shortness of breath for 3 weeks EXAM: CHEST  2 VIEW COMPARISON:  03/20/2017, 07/24/2016 FINDINGS: Small bilateral pleural effusions. Borderline cardiomegaly. No pneumothorax. Slight increased interstitial prominence compared to prior. IMPRESSION: 1. Small bilateral effusions 2. Slight increased diffuse interstitial prominence compared to prior may reflect mild interstitial edema Electronically Signed   By: Donavan Foil M.D.   On: 04/01/2017 01:17   01/21/2017 Procedures   Coronary Stent Intervention  Conclusion   Conclusions: 1. Significant multivessel coronary artery disease (see yesterday's diagnostic catheterization for details). 2. Successful PCI to mid LAD with placement of a Synergy 3.0 x 32 mm drug-eluting stent with 0% residual stenosis and TIMI-3 flow.  Recommendations: 1. Dual antiplatelet therapy with aspirin and clopidogrel for at least 12 months. 2. Aggressive secondary prevention. 3. If chest pain recurs despite optimal medical therapy, PCI to ramus and/or distal LAD could be considered.  Nelva Bush, MD [facility]   Diagnostic Diagram       Post-Intervention Diagram        -------------------------------------------------------------------------------------------------------------------------------------------  01/20/2017 echocardiogram complete ------------------------------------------------------------------- LV EF: 45% -   50%  ------------------------------------------------------------------- Indications:      MI - anterior wall - acute  410.11.  ------------------------------------------------------------------- History:   PMH:  Non-Hodgkins Lymphoma. Peripheral Vascular Disease. Chronic Kidney Disease. Elevated Troponin. Sleep Apnea. Chest pain.  Risk factors:  Hypertension. Diabetes mellitus.  ------------------------------------------------------------------- Study Conclusions  - Left ventricle: Wall thickness was increased in a pattern of   moderate LVH. Systolic function was mildly reduced. The estimated   ejection fraction was in the range of 45% to 50%. Features are   consistent with a pseudonormal left ventricular filling pattern,   with concomitant abnormal relaxation and increased filling   pressure (grade 2 diastolic dysfunction). - Aortic valve: There was mild regurgitation.  EKG: Independently reviewed. Vent. rate 61 BPM PR interval * ms QRS duration 145 ms QT/QTc 469/473 ms P-R-T axes 0 -59 52 sinus rhythm with 1st degree AV block Left bundle branch block No significant change was found  Assessment/Plan Principal Problem:   Acute on chronic systolic and diastolic heart failure, NYHA class 1 (HCC) Observation/telemetry. Continue supplemental oxygen. Fluid restrictions to 1800 mL daily. Sodium restriction. Monitor intake and output. Daily weights. Furosemide 60 mg IVP twice a day. Supplement potassium and follow-up level. Continue losartan 100 mg by mouth daily. Follow-up BUN, creatinine and electrolytes. Hold beta blocker due to acute exacerbation. Add Imdur 30 mg po daily. The patient already has had first/intake appointment for cardiac rehabilitation.  Active Problems:   Elevated troponin I level Continue cardiac monitoring. Trend troponin levels.    CAD (coronary artery disease) Continue aspirin, clopidogrel and simvastatin. Resume beta blocker once PACU CHF exacerbation improved.    Chronic kidney disease Stage IV Monitor renal function and electrolytes closely. Monitor  intake and output.      Type 2 diabetes mellitus with stage 4 chronic kidney disease (HCC) Carbohydrate modified diet. Continue insulin degludec 60 units SQ at bedtime. CBG monitoring with regular insulin sliding scale while in the hospital. Hemoglobin A1c follow-up per PCP.    Diabetic neuropathy (HCC) Continue Neurontin 100 mg by mouth at bedtime.    Hypertension Continue amlodipine 10 mg by mouth daily. Continue losartan 100 mg by mouth daily. Hold bisoprolol due to acute exacerbation.      Sleep apnea Not on CPAP. Continue oxygen while in the hospital.    Hypothyroidism Continue levothyroxine 300  g by mouth daily. Check TSH level as needed.      Morbid obesity ([place]) Advised to adhere to strict lifestyle modifications to decrease morbidity and mortality.     DVT prophylaxis: Lovenox SQ. Code Status: Full code. Family Communication: He was accompanied by his SO, Merchant [employer] officer. Disposition Plan: Admit for congestive heart failure treatment. Consults called:  Admission status: Observation/telemetry.   Reubin Milan MD [facility] Pager [phone].  If 7PM-7AM, please contact night-coverage [email or web address] Password [email or web address]  04/01/2017, 3:07 AM

## 2017-04-01 NOTE — ED Provider Notes (Signed)
[facility] DEPT Provider Note   CSN: [id number] Arrival date & time: 03/31/17  2346     History   Chief Complaint Chief Complaint  Patient presents with  . Shortness of Breath    HPI [name] [name] is a 71 y.o. male.  Patient presents with progressively worsening dyspnea worse with exertion. He states he's had this problem for about 3 weeks but became acutely worse tonight. He was admitted to the hospital for this problem on August 18 and discharged on the 19th. He states this breathing issues were attributed to respiratory illness at that time. He denies any improvement and feels worse. He has a cough but is not able to bring up anything. Denies fever. Denies chest pain. Denies leg pain or leg swelling. His wife reports his weight is about 12 pounds up from his previous hospitalization. He denies any history of heart failure. Notably he had a NSTEMI in June requiring one stent to his LAD. He did not have any chest pain with his heart attack. He denies any abdominal pain, nausea or vomiting. No fever. States he normally has to lie at side and cannot lay flat at baseline.   The history is provided by the patient and the spouse.  Shortness of Breath  Associated symptoms include cough. Pertinent negatives include no fever, no headaches, no chest pain, no vomiting, no abdominal pain, no rash and no leg swelling.    Past Medical History:  Diagnosis Date  . Cancer (HCC)    low grade non hodgkin's lymphoma  . Chronic kidney disease    stage III kidney disease   . Diabetes mellitus   . Hypothyroidism   . Lymphoma ([place])   . Peripheral vascular disease (HCC)    neuropathy in toes   . Pneumonia    hx of   . S/P biopsy   . Shortness of breath    due ot pain in knees   . Sleep apnea    sleep study 3/29 13 ? location     Patient Active Problem List   Diagnosis Date Noted  . SOB (shortness of breath) 03/21/2017  . HTN (hypertension) 03/21/2017  . NSTEMI (non-ST elevated  myocardial infarction) ([place]) 01/19/2017  . NSTEMI, initial episode of care ([place]) 01/19/2017  . Sepsis ([place]) 07/24/2016  . Elevated troponin I level 07/24/2016  . Fever 07/24/2016  . Lactic acidosis 07/24/2016  . Adjustment insomnia 05/18/2016  . Erectile dysfunction 12/19/2015  . Sleep apnea 09/30/2015  . Osteoarthritis 09/30/2015  . Hypothyroidism 09/30/2015  . Hypogonadism in male 09/30/2015  . Diabetic neuropathy ([place]) 09/30/2015  . Chronic pain of left knee 09/30/2015  . Benign essential hypertension 09/30/2015  . Acute on chronic kidney failure ([place]) 05/14/2015  . Diarrhea 05/14/2015  . Dehydration 05/14/2015  . Hypokalemia 05/14/2015  . 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 Pelvic mass in male   . Varices, esophageal ([place])   . Colonic mass   . Abnormal CT scan, pelvis   . Bladder mass   . Elevated liver enzymes   . Elevated LFTs   . Abdominal pain 07/23/2014  . Chronic kidney disease Stage IV 07/23/2014  . Morbid obesity ([place]) 07/23/2014  . Type 2 diabetes mellitus with stage 4 chronic kidney disease ([place]) 07/23/2014  . Hypertension 07/23/2014  . S/P right TKA 11/11/2011    Past Surgical History:  Procedure Laterality Date  . CHOLECYSTECTOMY  1992  . COLONOSCOPY N/A 09/03/2014   SLF:six colon polyps removed/small internal hemorrhoids  .  CORONARY STENT INTERVENTION N/A 01/21/2017   Procedure: Coronary Stent Intervention;  Surgeon: Nelva Bush, MD;  Location: [facility];  Service: Cardiovascular;  Laterality: N/A;  . ESOPHAGOGASTRODUODENOSCOPY N/A 09/03/2014   SLF: mild gastritis/few gastric polyps  . LEFT HEART CATH AND CORONARY ANGIOGRAPHY N/A 01/20/2017   Procedure: Left Heart Cath and Coronary Angiography;  Surgeon: Jettie Booze, MD;  Location: [facility];  Service: Cardiovascular;  Laterality: N/A;  . TOTAL KNEE ARTHROPLASTY  11/10/2011   Procedure: TOTAL KNEE ARTHROPLASTY;  Surgeon: Mauri Pole, MD;  Location: [facility];  Service:  Orthopedics;  Laterality: Right;       Home Medications    Prior to Admission medications   Medication Sig Start Date End Date Taking? Authorizing Provider  albuterol (PROVENTIL HFA;VENTOLIN HFA) 108 (90 Base) MCG/ACT inhaler Inhale 2 puffs into the lungs every 6 (six) hours as needed for wheezing or shortness of breath. 03/21/17   Waldron Session, MD  amLODipine (NORVASC) 10 MG tablet Take 1 tablet (10 mg total) by mouth daily. 03/21/17 03/21/18  Waldron Session, MD  aspirin 81 MG tablet Take 81 mg by mouth daily.    [provider]  bisoprolol (ZEBETA) 5 MG tablet Take 1 tablet (5 mg total) by mouth daily. 03/22/17   Waldron Session, MD  calcipotriene (DOVONOX) 0.005 % cream  01/08/17   [provider]  clopidogrel (PLAVIX) 75 MG tablet Take 1 tablet (75 mg total) by mouth daily. 01/23/17   Florencia Reasons, MD  fluticasone (FLONASE) 50 MCG/ACT nasal spray Place 1 spray into both nostrils daily.    [provider]  gabapentin (NEURONTIN) 100 MG capsule Take 100 mg by mouth at bedtime.    [provider]  hydrocortisone 2.5 % cream APPLY TOPICALLY DAILY AS NEEDED TO CATH SITE 02/25/17   Lendon Colonel, NP  insulin aspart (NOVOLOG FLEXPEN) 100 UNIT/ML FlexPen Inject into the skin. Sliding scale As needed for blood sugar    [provider]  insulin degludec (TRESIBA FLEXTOUCH) 100 UNIT/ML SOPN FlexTouch Pen Inject 0.5 mLs (50 Units total) into the skin daily at 10 pm. Patient taking differently: Inject 60 Units into the skin daily at 10 pm.  07/28/16   Kathie Dike, MD  levothyroxine (SYNTHROID, LEVOTHROID) 100 MCG tablet Take 100 mcg by mouth daily before breakfast.    [provider]  levothyroxine (SYNTHROID, LEVOTHROID) 200 MCG tablet Take 200 mcg by mouth daily before breakfast.    [provider]  losartan (COZAAR) 100 MG tablet Take 100 mg by mouth. 02/02/17   [provider]  montelukast (SINGULAIR) 10 MG tablet Take 10 mg by mouth.  08/07/16   [provider]  nitroGLYCERIN (NITROSTAT) 0.4 MG SL tablet Place 1 tablet (0.4 mg total) under the tongue every 5 (five) minutes as needed for chest pain. Up to 3 doses. If no relief after the 3rd dose, proceed to the ED for an evaluation 03/21/17 06/19/17  Waldron Session, MD  omega-3 acid ethyl esters (LOVAZA) 1 g capsule Take 1 capsule by mouth daily.  11/26/16   [provider]  simvastatin (ZOCOR) 5 MG tablet Take 5 mg by mouth daily. 09/14/16   [provider]  traZODone (DESYREL) 50 MG tablet TAKE 1 TO 2 TABLETS AT BEDTIME FOR INSOMNIA 09/30/16   [provider]    Family History Family History  Problem Relation Age of Onset  . Cancer Mother        breast and  lung  . Cancer Father        bladder  . Cancer Maternal Uncle        prostate  . Cancer Paternal Uncle        esophagus  . Colon cancer Neg Hx     Social History Social History  Substance Use Topics  . Smoking status: Former Smoker    Packs/day: 1.00    Years: 30.00    Types: Cigarettes    Quit date: 08/04/2007  . Smokeless tobacco: Never Used  . Alcohol use No     Allergies   Patient has no known allergies.   Review of Systems Review of Systems  Constitutional: Positive for activity change, appetite change and fatigue. Negative for fever.  HENT: Negative for congestion, nosebleeds and sinus pressure.   Eyes: Negative for visual disturbance.  Respiratory: Positive for cough and shortness of breath.   Cardiovascular: Negative for chest pain and leg swelling.  Gastrointestinal: Negative for abdominal pain, nausea and vomiting.  Genitourinary: Negative for dysuria and hematuria.  Musculoskeletal: Negative for arthralgias and myalgias.  Skin: Negative for rash.  Neurological: Negative for dizziness, weakness and headaches.    all other systems are negative except as noted in the HPI and PMH.    Physical Exam Updated Vital Signs BP (!) 164/77 (BP Location: Left Arm)    Pulse (!) 58   Temp 98.1 F (36.7 C) (Oral)   Resp (!) 24   Ht 6' (1.829 m)   Wt (!) 141.5 kg (312 lb)   SpO2 94%   BMI 42.31 kg/m   Physical Exam  Constitutional: He is oriented to person, place, and time. He appears well-developed and well-nourished. He appears distressed.  Dyspneic with conversation  HENT:  Head: Normocephalic and atraumatic.  Mouth/Throat: Oropharynx is clear and moist. No oropharyngeal exudate.  Eyes: Pupils are equal, round, and reactive to light. Conjunctivae and EOM are normal.  Neck: Normal range of motion. Neck supple.  No meningismus.  Cardiovascular: Normal rate, regular rhythm, normal heart sounds and intact distal pulses.   No murmur heard. Pulmonary/Chest: Effort normal. No respiratory distress. He has rales.  Bibasilar crackles, scattered expiratory wheezing  Abdominal: Soft. There is no tenderness. There is no rebound and no guarding.  obese  Musculoskeletal: Normal range of motion. He exhibits edema. He exhibits no tenderness.  Trace pedal edema bilaterally  Neurological: He is alert and oriented to person, place, and time. No cranial nerve deficit. He exhibits normal muscle tone. Coordination normal.   5/5 strength throughout. CN 2-12 intact.Equal grip strength.   Skin: Skin is warm.  Psychiatric: He has a normal mood and affect. His behavior is normal.  Nursing note and vitals reviewed.    ED Treatments / Results  Labs (all labs ordered are listed, but only abnormal results are displayed) Labs Reviewed  CBC WITH DIFFERENTIAL/PLATELET - Abnormal; Notable for the following:       Result Value   RDW 16.1 (*)    Platelets 137 (*)    All other components within normal limits  BASIC METABOLIC PANEL - Abnormal; Notable for the following:    Glucose, Bld 143 (*)    BUN 31 (*)    Creatinine, Ser 2.16 (*)    Calcium 8.7 (*)    GFR calc non Af Amer 29 (*)    GFR calc Af Amer 34 (*)    All other components within normal limits  TROPONIN I -  Abnormal; Notable for the following:  Troponin I 0.05 (*)    All other components within normal limits  BRAIN NATRIURETIC PEPTIDE - Abnormal; Notable for the following:    B Natriuretic Peptide 288.0 (*)    All other components within normal limits  D-DIMER, QUANTITATIVE (NOT AT [facility])    EKG  EKG Interpretation  Date/Time:  Wednesday March 31 2017 23:56:18 EDT Ventricular Rate:  61 PR Interval:    QRS Duration: 145 QT Interval:  469 QTC Calculation: 473 R Axis:   -59 Text Interpretation:  sinus rhythm with 1st degree AV block  Left bundle branch block No significant change was found Confirmed by Ezequiel Essex [phone]) on 04/01/2017 12:15:38 AM       Radiology Dg Chest 2 View  Result Date: 04/01/2017 CLINICAL DATA:  Shortness of breath for 3 weeks EXAM: CHEST  2 VIEW COMPARISON:  03/20/2017, 07/24/2016 FINDINGS: Small bilateral pleural effusions. Borderline cardiomegaly. No pneumothorax. Slight increased interstitial prominence compared to prior. IMPRESSION: 1. Small bilateral effusions 2. Slight increased diffuse interstitial prominence compared to prior may reflect mild interstitial edema Electronically Signed   By: Donavan Foil M.D.   On: 04/01/2017 01:17    Procedures Procedures (including critical care time)  Medications Ordered in ED Medications  ipratropium-albuterol (DUONEB) 0.5-2.5 (3) MG/3ML nebulizer solution 3 mL (not administered)     Initial Impression / Assessment and Plan / ED Course  I have reviewed the triage vital signs and the nursing notes.  Pertinent labs & imaging results that were available during my care of the patient were reviewed by me and considered in my medical decision making (see chart for details).     Patient with history of progressive dyspnea on exertion. No chest pain.. EKG is unchanged left bundle block.  Does have some crackles at bases and scattered wheezing. We'll check chest x-ray, labs  Concern for CHF exacerbation.  Patient given IV Lasix. Echocardiogram in June showed EF of 50%.  Minimal troponin elevation similar to previous. Similar Creatinine elevation. D-dimer is negative. Doubt PE. Doubt ACS. Patient becomes dyspneic with exertion and desaturates to 85%. He is dyspneic with conversation. Plan admission for IV diuresis given his recent MI. D/w Dr. Olevia Bowens.   Final Clinical Impressions(s) / ED Diagnoses   Final diagnoses:  Acute on chronic systolic and diastolic heart failure, NYHA class 1 (HCC)    New Prescriptions New Prescriptions   No medications on file     Ezequiel Essex, MD 04/01/17 [phone]

## 2017-04-01 NOTE — ED Notes (Signed)
Pt ambulated around nurses' station, O2 sats maintained at 95% while walking, after patient was assisted back to bed, O2 sats dropped to 87% and pt c/o some sob; Dr. Wyvonnia Dusky informed; pt given diet coke, peanut butter and crackers upon request

## 2017-04-01 NOTE — Progress Notes (Signed)
FOLLOW UP NOTE FROM EARLIER ADMISSION TODAY:  SHERIFF RODENBERG is an 71 y.o. male with hx of NSTEMI 2 month ago, troponin of 26, culminated into cath and stent placement to the LAD,  Cath showed totally occluded 1st Mrg (culprit), small ramus 75%, 80% LAD, DM, HTN, CKD4, recently discharged, re admitted for CHF.  He is getting diuresed and is feeling better already. Will continue with IV Lasix, follow Cr carefully and continue as planned outlined by Dr Olevia Bowens.  Orvan Falconer MD FACP. Hospitalist.

## 2017-04-01 NOTE — Care Management Obs Status (Signed)
[facility] NOTIFICATION   Patient Details  Name: [name] [name] MRN: [id number] Date of Birth: 05/16/1946   Medicare Observation Status Notification Given:  Yes    Sherald Barge, RN 04/01/2017, 1:37 PM

## 2017-04-01 NOTE — Care Management Note (Signed)
Case Management Note  Patient Details  Name: CHEY CHO MRN: [id number] Date of Birth: [birth year]  Subjective/Objective:                  Admitted with CHF. Pt from home with, lives with gf. ind with ADL's. Has PCP, transportation and insurance with drug coverage. Pt plans to return home with self care. He has cane he uses as needed. Oxygen being used acutely. Pt enrolled in cardiac rehab program, next appointment 9/6. Pt communicates no needs or concerns about DC plan.   Action/Plan: DC home with self care. Will monitor for supplemental oxygen need at DC.   Expected Discharge Date:     04/02/2017             Expected Discharge Plan:  Home/Self Care  In-House Referral:  NA  Discharge planning Services  CM Consult  Post Acute Care Choice:  NA Choice offered to:  NA  Status of Service:  Completed, signed off  Sherald Barge, RN 04/01/2017, 1:58 PM

## 2017-04-01 NOTE — ED Notes (Signed)
Date and time results received: 04/01/17 0058 (use smartphrase ".now" to insert current time)  Test: troponin Critical Value: 0.05  Name of Provider Notified: Dr. Wyvonnia Dusky  Orders Received? Or Actions Taken?: no/na

## 2017-04-01 NOTE — ED Notes (Signed)
Report given to [facility] on 300

## 2017-04-02 DIAGNOSIS — R748 Abnormal levels of other serum enzymes: Secondary | ICD-10-CM | POA: Diagnosis not present

## 2017-04-02 DIAGNOSIS — I5043 Acute on chronic combined systolic (congestive) and diastolic (congestive) heart failure: Secondary | ICD-10-CM | POA: Diagnosis not present

## 2017-04-02 LAB — GLUCOSE, CAPILLARY: Glucose-Capillary: 97 mg/dL (ref 65–99)

## 2017-04-02 LAB — BASIC METABOLIC PANEL
ANION GAP: 8 (ref 5–15)
BUN: 38 mg/dL — ABNORMAL HIGH (ref 6–20)
CO2: 27 mmol/L (ref 22–32)
CREATININE: 2.4 mg/dL — AB (ref 0.61–1.24)
Calcium: 8.7 mg/dL — ABNORMAL LOW (ref 8.9–10.3)
Chloride: 105 mmol/L (ref 101–111)
GFR, EST AFRICAN AMERICAN: 30 mL/min — AB (ref >60)
GFR, EST NON AFRICAN AMERICAN: 26 mL/min — AB (ref >60)
Glucose, Bld: 113 mg/dL — ABNORMAL HIGH (ref 65–99)
Potassium: 3.4 mmol/L — ABNORMAL LOW (ref 3.5–5.1)
SODIUM: 140 mmol/L (ref 135–145)

## 2017-04-02 MED ORDER — ISOSORBIDE MONONITRATE ER 30 MG PO TB24: 30.0000 mg | ORAL_TABLET | Freq: Every day | ORAL | 1 refills | Status: DC

## 2017-04-02 MED ORDER — FUROSEMIDE 40 MG PO TABS
40.0000 mg | ORAL_TABLET | Freq: Every day | ORAL | 11 refills | Status: DC
Start: 2017-04-02 — End: 2018-05-30

## 2017-04-02 NOTE — Progress Notes (Signed)
IV removed, WNL. D/C instructions given to pt. Verbalized understanding. Pt wife at bedside to transport home.

## 2017-04-02 NOTE — Care Management (Signed)
DC home with self care today. Pt successfully weaned from supplemental oxygen.

## 2017-04-02 NOTE — Discharge Summary (Signed)
Physician Discharge Summary  [name] [name] IWP:[id number] DOB: 1945-12-23 DOA: 03/31/2017  PCP: [name]  Admit date: 03/31/2017 Discharge date: 04/02/2017  Admitted From: Home.  Disposition:  Home.   Recommendations for Outpatient Follow-up:  1. Follow up with PCP in 1-2 weeks 2. Follow up with cardiology as soon as possible.   Home Health:  None.  Equipment/Devices: None.  Discharge Condition: Much improved.  CODE STATUS:  FULL CODE>  Diet recommendation: Cardiac and carb modified diet.   Brief/Interim Summary:  Patient was admitted by Dr Olevia Bowens on Apr 01, 2017.  As per his H and P:  " [name] [name] is a 71 y.o. male with medical history significant of CAD,'S/P NSTEMI in June 2018, low-grade non-Hodgkin lymphoma, type 2 diabetes, diabetic peripheral neuropathy, chronic kidney disease, hyperlipidemia, hypertension, hypothyroidism, peripheral vascular disease, morbid obesity, sleep apnea (not on CPAP), history of pneumonia who was recently admitted and discharged from 08/18 to 03/21/2017 due to congestive heart failure and is returning to the emergency department today with complaints of worsening shortness of breath after going to bed yesterday evening. He complains of nonproductive cough, mild lower extremity edema and orthopnea. He also has had a 12 pound weight gain since he was discharged. He denies fever, chills, but complains of fatigue. He denies rhinorrhea, sore throat, hemoptysis, chest pain, dizziness, palpitations, diaphoresis, abdominal pain, diarrhea, melena or hematochezia. He mentions that his constipation has improved since he stopped verapamil. He denies dysuria, frequency, hematuria, polyuria, polydipsia, polyphagia or blurred vision. No significant rashes or pruritus.  ED Course: Initial vital signs in the emergency department were temperature 36.7C, pulse 55, blood pressure 160/72 mmHg, respirations 19 and O2 sat 89% on room air. His O2 sat  improved to 94% on 2 LPM via nasal cannula. His workup shows an elevated BNP at 288 pg/mL, troponin mildly elevated at 0.05 ng/mL. EKG is sinus rhythm with first-degree AV block and previous LBBB. No significant change from last tracing. Hemoglobin was 14.9 g/dL, WBC 9.5 and platelets 137. PT/INR/PTT were within normal limits. Sodium 138, potassium 3.6, chloride 108 and bicarbonate 24 mmol/L. BUN was 31, creatinine 2.16, magnesium 2.3 and glucose 133 mg/dL. His chest radiograph showed small bilateral pleural effusion and increased interstitial markings likely due to interstitial edema. Please see images and full radiology report for further detail.   Besides supplemental oxygen, the patient also received a DuoNeb and 40 mg of furosemide IVP. He mentions that he feels better.  HOSPITAL COURSE:  Patient with hx of NSTEMI a few months ago, had cardiac cath with stent placement, hx of HTN, DM< CKD4, admitted for mild acute on chronic combined CHF, NYHA class I.   He was having some bilateral pleural effusion.  He was given IV Lasix, and did well.  He was kept in the hospital, and continued with his diuresis. His Cr remained stable, though elevated at 2.3-2.4.   He is very anxious to go home, and is stable for discharge.  He will see his PCP next week, and will see his cardiologist as scheduled.  Will discharge him on Lasix 40mg  per day.  Thank you for allowing me to participate in his care.  Good Day.   Discharge Diagnoses:  Principal Problem:   Acute on chronic systolic and diastolic heart failure, NYHA class 1 (HCC) Active Problems:   Chronic kidney disease Stage IV   Morbid obesity (HCC)   Type 2 diabetes mellitus with stage 4 chronic kidney disease ([place])  Hypertension   Elevated troponin I level   Sleep apnea   Hypothyroidism   Diabetic neuropathy (HCC)   CAD (coronary artery disease)    Discharge Instructions  Discharge Instructions    Diet - low sodium heart healthy    Complete by:  As  directed    Discharge instructions    Complete by:  As directed    Take your medication as prescribed.  Follow up with your cardiologist and your PCP next week.   Increase activity slowly    Complete by:  As directed      Allergies as of 04/02/2017   No Known Allergies     Medication List    TAKE these medications   albuterol 108 (90 Base) MCG/ACT inhaler Commonly known as:  PROVENTIL HFA;VENTOLIN HFA Inhale 2 puffs into the lungs every 6 (six) hours as needed for wheezing or shortness of breath.   amLODipine 10 MG tablet Commonly known as:  NORVASC Take 1 tablet (10 mg total) by mouth daily.   aspirin 81 MG tablet Take 81 mg by mouth daily.   bisoprolol 5 MG tablet Commonly known as:  ZEBETA Take 1 tablet (5 mg total) by mouth daily.   calcipotriene 0.005 % cream Commonly known as:  DOVONOX Apply 1 application topically 2 (two) times daily.   clopidogrel 75 MG tablet Commonly known as:  PLAVIX Take 1 tablet (75 mg total) by mouth daily.   fluticasone 50 MCG/ACT nasal spray Commonly known as:  FLONASE Place 1 spray into both nostrils daily.   furosemide 40 MG tablet Commonly known as:  LASIX Take 1 tablet (40 mg total) by mouth daily.   gabapentin 100 MG capsule Commonly known as:  NEURONTIN Take 100 mg by mouth at bedtime.   insulin degludec 100 UNIT/ML Sopn FlexTouch Pen Commonly known as:  TRESIBA FLEXTOUCH Inject 0.5 mLs (50 Units total) into the skin daily at 10 pm. What changed:  how much to take   isosorbide mononitrate 30 MG 24 hr tablet Commonly known as:  IMDUR Take 1 tablet (30 mg total) by mouth daily.   levothyroxine 200 MCG tablet Commonly known as:  SYNTHROID, LEVOTHROID Take 200 mcg by mouth daily before breakfast.   levothyroxine 100 MCG tablet Commonly known as:  SYNTHROID, LEVOTHROID Take 100 mcg by mouth daily before breakfast.   losartan 100 MG tablet Commonly known as:  COZAAR Take 100 mg by mouth.   montelukast 10 MG  tablet Commonly known as:  SINGULAIR Take 10 mg by mouth.   nitroGLYCERIN 0.4 MG SL tablet Commonly known as:  NITROSTAT Place 1 tablet (0.4 mg total) under the tongue every 5 (five) minutes as needed for chest pain. Up to 3 doses. If no relief after the 3rd dose, proceed to the ED for an evaluation   NOVOLOG FLEXPEN 100 UNIT/ML FlexPen Generic drug:  insulin aspart Inject into the skin. Sliding scale As needed for blood sugar   omega-3 acid ethyl esters 1 g capsule Commonly known as:  LOVAZA Take 1 capsule by mouth daily.   simvastatin 5 MG tablet Commonly known as:  ZOCOR Take 5 mg by mouth daily.   SYMBICORT 80-4.5 MCG/ACT inhaler Generic drug:  budesonide-formoterol Inhale 2 puffs into the lungs 2 (two) times daily.   traZODone 50 MG tablet Commonly known as:  DESYREL TAKE 1 TO 2 TABLETS AT BEDTIME FOR INSOMNIA            Discharge Care Instructions  Start     Ordered   04/02/17 0000  isosorbide mononitrate (IMDUR) 30 MG 24 hr tablet  Daily     04/02/17 0914   04/02/17 0000  Increase activity slowly     04/02/17 0914   04/02/17 0000  Diet - low sodium heart healthy     04/02/17 0914   04/02/17 0000  Discharge instructions    Comments:  Take your medication as prescribed.  Follow up with your cardiologist and your PCP next week.   04/02/17 0914   04/02/17 0000  furosemide (LASIX) 40 MG tablet  Daily     04/02/17 0914      No Known Allergies  Consultations:  NONE>   Procedures/Studies: Dg Chest 2 View  Result Date: 04/01/2017 CLINICAL DATA:  Shortness of breath for 3 weeks EXAM: CHEST  2 VIEW COMPARISON:  03/20/2017, 07/24/2016 FINDINGS: Small bilateral pleural effusions. Borderline cardiomegaly. No pneumothorax. Slight increased interstitial prominence compared to prior. IMPRESSION: 1. Small bilateral effusions 2. Slight increased diffuse interstitial prominence compared to prior may reflect mild interstitial edema Electronically Signed   By: Donavan Foil M.D.   On: 04/01/2017 01:17   Dg Chest 2 View  Result Date: 03/20/2017 CLINICAL DATA:  Shortness of Breath EXAM: CHEST  2 VIEW COMPARISON:  Chest radiograph January 19, 2017 and chest CT February 05, 2017 FINDINGS: There is no edema or consolidation. Heart is mildly enlarged with pulmonary vascularity within normal limits. No adenopathy. No bone lesions. IMPRESSION: Mild cardiac enlargement.  No edema or consolidation. Electronically Signed   By: Lowella Grip III M.D.   On: 03/20/2017 20:30     Subjective:  Feeling well.   Discharge Exam: Vitals:   04/01/17 2135 04/02/17 0450  BP: (!) 134/58 136/61  Pulse: (!) 48 (!) 48  Resp: 20 20  Temp: 98.4 F (36.9 C) 98.5 F (36.9 C)  SpO2: 98% 94%   Vitals:   04/01/17 1424 04/01/17 1457 04/01/17 2135 04/02/17 0450  BP: 115/68  (!) 134/58 136/61  Pulse: (!) 51  (!) 48 (!) 48  Resp: 20  20 20   Temp: 97.7 F (36.5 C)  98.4 F (36.9 C) 98.5 F (36.9 C)  TempSrc: Oral  Oral Oral  SpO2: 96% 95% 98% 94%  Weight:    (!) 138.8 kg (305 lb 14.4 oz)  Height:        General: Pt is alert, awake, not in acute distress Cardiovascular: RRR, S1/S2 +, no rubs, no gallops Respiratory: CTA bilaterally, no wheezing, no rhonchi Abdominal: Soft, NT, ND, bowel sounds + Extremities: no edema, no cyanosis    The results of significant diagnostics from this hospitalization (including imaging, microbiology, ancillary and laboratory) are listed below for reference.    Labs: BNP (last 3 results)  Recent Labs  03/20/17 2234 04/01/17 0005  BNP 247.0* 176.1*   Basic Metabolic Panel:  Recent Labs Lab 04/01/17 0005 04/01/17 0200 04/01/17 1145 04/02/17 0436  NA 138  --  140 140  K 3.6  --  3.6 3.4*  CL 108  --  105 105  CO2 24  --  25 27  GLUCOSE 143*  --  146* 113*  BUN 31*  --  32* 38*  CREATININE 2.16*  --  2.33* 2.40*  CALCIUM 8.7*  --  8.7* 8.7*  MG  --  2.3  --   --    CBC:  Recent Labs Lab 04/01/17 0005  WBC 9.5   NEUTROABS 7.1  HGB 14.9  HCT 44.1  MCV 87.5  PLT 137*   Cardiac Enzymes:  Recent Labs Lab 04/01/17 0005 04/01/17 0607 04/01/17 1145 04/01/17 1833  TROPONINI 0.05* 0.05* 0.05* 0.04*   CBG:  Recent Labs Lab 04/01/17 0725 04/01/17 1113 04/01/17 1632 04/01/17 2045 04/02/17 0716  GLUCAP 126* 155* 127* 167* 97   D-Dimer  Recent Labs  04/01/17 0005  DDIMER 0.45   Urinalysis    Component Value Date/Time   COLORURINE YELLOW 01/19/2017 1530   APPEARANCEUR HAZY (A) 01/19/2017 1530   LABSPEC 1.021 01/19/2017 1530   PHURINE 5.0 01/19/2017 1530   GLUCOSEU 150 (A) 01/19/2017 1530   HGBUR SMALL (A) 01/19/2017 1530   BILIRUBINUR NEGATIVE 01/19/2017 1530   KETONESUR NEGATIVE 01/19/2017 1530   PROTEINUR 100 (A) 01/19/2017 1530   UROBILINOGEN 4.0 (H) 07/23/2014 1356   NITRITE NEGATIVE 01/19/2017 1530   LEUKOCYTESUR NEGATIVE 01/19/2017 1530    Time coordinating discharge: Over 30 minutes SIGNED:  Orvan Falconer, MD FACP [facility] 04/02/2017, 9:14 AM   If 7PM-7AM, please contact night-coverage [email or web address] Password [email or web address]

## 2017-04-07 ENCOUNTER — Institutional Professional Consult (permissible substitution): Payer: Medicare HMO | Admitting: Pulmonary Disease

## 2017-04-07 ENCOUNTER — Encounter (HOSPITAL_COMMUNITY)
Admission: RE | Admit: 2017-04-07 | Discharge: 2017-04-07 | Disposition: A | Discharge status: Home / Self Care | Payer: Medicare HMO | Source: Ambulatory Visit | Attending: Cardiology | Admitting: Cardiology

## 2017-04-07 DIAGNOSIS — I214 Non-ST elevation (NSTEMI) myocardial infarction: Secondary | ICD-10-CM | POA: Diagnosis not present

## 2017-04-07 DIAGNOSIS — Z955 Presence of coronary angioplasty implant and graft: Secondary | ICD-10-CM | POA: Insufficient documentation

## 2017-04-07 NOTE — Progress Notes (Signed)
Daily Session Note  Patient Details  Name: [name] [name] MRN: [id number] Date of Birth: 1945/11/25 Referring Provider:     CARDIAC REHAB PHASE II ORIENTATION from 03/29/2017 in [place]  Referring Provider  Dr. Beau Fanny      Encounter Date: 04/07/2017  Check In:     Session Check In - 04/07/17 0930      Check-In   Location [facility]   Staff Present Aundra Dubin, RN, BSN;Gregory Luther Parody, BS, EP, Exercise Physiologist   Supervising physician immediately available to respond to emergencies See telemetry face sheet for immediately available MD   Medication changes reported     No   Fall or balance concerns reported    No   Warm-up and Cool-down Performed as group-led instruction   Resistance Training Performed Yes   VAD Patient? No     Pain Assessment   Currently in Pain? No/denies   Pain Score 0-No pain   Multiple Pain Sites No      Capillary Blood Glucose: No results found for this or any previous visit (from the past 24 hour(s)).    History  Smoking Status  . Former Smoker  . Packs/day: 1.00  . Years: 30.00  . Types: Cigarettes  . Quit date: 08/04/2007  Smokeless Tobacco  . Never Used    Goals Met:  Independence with exercise equipment Exercise tolerated well No report of cardiac concerns or symptoms Strength training completed today  Goals Unmet:  Not Applicable  Comments: Check out 1030.   Dr. Kate Sable is Medical Director for [facility].

## 2017-04-07 NOTE — Progress Notes (Signed)
Cardiac Individual Treatment Plan  Patient Details  Name: [name] [name] MRN: [id number] Date of Birth: [birth year] Referring Provider:     CARDIAC REHAB PHASE II ORIENTATION from 03/29/2069 in [place]  Referring Provider  Dr. Beau Fanny      Initial Encounter Date:    CARDIAC REHAB PHASE II ORIENTATION from 03/29/2069 in [place]  Date  03/29/69  Referring Provider  Dr. Beau Fanny      Visit Diagnosis: NSTEMI (non-ST elevated myocardial infarction) [facility])  Status post coronary artery stent placement  Patient's Home Medications on Admission:  Current Outpatient Prescriptions:  .  albuterol (PROVENTIL HFA;VENTOLIN HFA) 108 (90 Base) MCG/ACT inhaler, Inhale 2 puffs into the lungs every 6 (six) hours as needed for wheezing or shortness of breath., Disp: 1 Inhaler, Rfl: 2 .  amLODipine (NORVASC) 10 MG tablet, Take 1 tablet (10 mg total) by mouth daily., Disp: 30 tablet, Rfl: 11 .  aspirin 81 MG tablet, Take 81 mg by mouth daily., Disp: , Rfl:  .  bisoprolol (ZEBETA) 5 MG tablet, Take 1 tablet (5 mg total) by mouth daily., Disp: 30 tablet, Rfl: 6 .  budesonide-formoterol (SYMBICORT) 80-4.5 MCG/ACT inhaler, Inhale 2 puffs into the lungs 2 (two) times daily. , Disp: , Rfl:  .  calcipotriene (DOVONOX) 0.005 % cream, Apply 1 application topically 2 (two) times daily. , Disp: , Rfl:  .  clopidogrel (PLAVIX) 75 MG tablet, Take 1 tablet (75 mg total) by mouth daily., Disp: 30 tablet, Rfl: 0 .  fluticasone (FLONASE) 50 MCG/ACT nasal spray, Place 1 spray into both nostrils daily., Disp: , Rfl:  .  furosemide (LASIX) 40 MG tablet, Take 1 tablet (40 mg total) by mouth daily., Disp: 30 tablet, Rfl: 11 .  gabapentin (NEURONTIN) 100 MG capsule, Take 100 mg by mouth at bedtime., Disp: , Rfl:  .  insulin aspart (NOVOLOG FLEXPEN) 100 UNIT/ML FlexPen, Inject into the skin. Sliding scale As needed for blood sugar, Disp: , Rfl:  .  insulin degludec (TRESIBA  FLEXTOUCH) 100 UNIT/ML SOPN FlexTouch Pen, Inject 0.5 mLs (50 Units total) into the skin daily at 10 pm. (Patient taking differently: Inject 60 Units into the skin daily at 10 pm. ), Disp: , Rfl:  .  isosorbide mononitrate (IMDUR) 30 MG 24 hr tablet, Take 1 tablet (30 mg total) by mouth daily., Disp: 30 tablet, Rfl: 1 .  levothyroxine (SYNTHROID, LEVOTHROID) 100 MCG tablet, Take 100 mcg by mouth daily before breakfast., Disp: , Rfl:  .  levothyroxine (SYNTHROID, LEVOTHROID) 200 MCG tablet, Take 200 mcg by mouth daily before breakfast., Disp: , Rfl:  .  losartan (COZAAR) 100 MG tablet, Take 100 mg by mouth., Disp: , Rfl:  .  montelukast (SINGULAIR) 10 MG tablet, Take 10 mg by mouth., Disp: , Rfl:  .  nitroGLYCERIN (NITROSTAT) 0.4 MG SL tablet, Place 1 tablet (0.4 mg total) under the tongue every 5 (five) minutes as needed for chest pain. Up to 3 doses. If no relief after the 3rd dose, proceed to the ED for an evaluation, Disp: 25 tablet, Rfl: 3 .  omega-3 acid ethyl esters (LOVAZA) 1 g capsule, Take 1 capsule by mouth daily. , Disp: , Rfl:  .  simvastatin (ZOCOR) 5 MG tablet, Take 5 mg by mouth daily., Disp: , Rfl:  .  traZODone (DESYREL) 50 MG tablet, TAKE 1 TO 2 TABLETS AT BEDTIME FOR INSOMNIA, Disp: , Rfl:   Past Medical History: Past Medical History:  Diagnosis Date  .  CAD (coronary artery disease)    S/P NSTEMI June 70  . Cancer (HCC)    low grade non hodgkin's lymphoma  . Chronic kidney disease    stage III kidney disease   . Diabetes mellitus   . Hyperlipidemia   . Hypertension   . Hypothyroidism   . Lymphoma ([place])   . Morbid obesity ([place])   . Peripheral vascular disease (HCC)    neuropathy in toes   . Pneumonia    hx of   . S/P biopsy   . Shortness of breath    due ot pain in knees   . Sleep apnea    sleep study 3/29 13 ? location     Tobacco Use: History  Smoking Status  . Former Smoker  . Packs/day: 1.00  . Years: 30.00  . Types: Cigarettes  . Quit date:  08/04/2007  Smokeless Tobacco  . Never Used    Labs: Recent Review Flowsheet Data    Labs for ITP Cardiac and Pulmonary Rehab Latest Ref Rng & Units 07/21/2008 07/22/2008 07/23/2008 07/23/2014 01/20/2017   Cholestrol 0 - 200 mg/dL - 126 ATP III CLASSIFICATION: <200     mg/dL   Desirable 200-239  mg/dL   Borderline High >=240    mg/dL   High 124 ATP III CLASSIFICATION: <200     mg/dL   Desirable 200-239  mg/dL   Borderline High >=240    mg/dL   High - 116   LDLCALC 0 - 99 mg/dL - UNABLE TO CALCULATE IF TRIGLYCERIDE OVER 400 mg/dL Total Cholesterol/HDL:CHD Risk Coronary Heart Disease Risk Table Men   Women 1/2 Average Risk   3.4   3.3 25 Total Cholesterol/HDL:CHD Risk Coronary Heart Disease Risk Table Men   Women 1/2 Average Risk   3.4   3.3 - 50   HDL >40 mg/dL - 27(L) 27(L) - 31(L)   Trlycerides <150 mg/dL - 423(H) 362(H) - 174(H)   Hemoglobin A1c 4.8 - 5.6 % 11.8 (NOTE)   The ADA recommends the following therapeutic goal for glycemic   control related to Hgb A1C measurement:   Goal of Therapy:   < 7.0% Hgb A1C   Reference: American Diabetes Association: Clinical Practice   Recommendations 2008, Diabetes Care, 2008, 31:(Suppl 1).(H) - - SEE SEPARATE REPORT 6.6(H)   TCO2 0 - 100 mmol/L 20 - - - -      Capillary Blood Glucose: Lab Results  Component Value Date   GLUCAP 97 04/02/69   GLUCAP 167 (H) 04/01/69   GLUCAP 127 (H) 04/01/69   GLUCAP 155 (H) 04/01/69   GLUCAP 126 (H) 04/01/69     Exercise Target Goals:    Exercise Program Goal: Individual exercise prescription set with THRR, safety & activity barriers. Participant demonstrates ability to understand and report RPE using BORG scale, to self-measure pulse accurately, and to acknowledge the importance of the exercise prescription.  Exercise Prescription Goal: Starting with aerobic activity 30 plus minutes a day, 3 days per week for initial exercise prescription. Provide home exercise prescription and  guidelines that participant acknowledges understanding prior to discharge.  Activity Barriers & Risk Stratification:   6 Minute Walk:     6 Minute Walk    Row Name 03/29/17 1414         6 Minute Walk   Phase Initial        Oxygen Initial Assessment:   Oxygen Re-Evaluation:   Oxygen Discharge (Final Oxygen Re-Evaluation):   Initial Exercise Prescription:  Initial Exercise Prescription - 03/29/17 1400      Date of Initial Exercise RX and Referring Provider   Date 03/29/17   Referring Provider Dr. Beau Fanny     NuStep   Level 2   SPM 84   Minutes 15   METs 1.7     Arm Ergometer   Level 1.5   Watts 20   RPM 50   Minutes 20   METs 1.5     Prescription Details   Frequency (times per week) 3   Duration Progress to 30 minutes of continuous aerobic without signs/symptoms of physical distress     Intensity   THRR 40-80% of Max Heartrate [phone]   Ratings of Perceived Exertion 11-13   Perceived Dyspnea 0-4     Progression   Progression Continue progressive overload as per policy without signs/symptoms or physical distress.     Resistance Training   Training Prescription Yes   Weight 1   Reps 10-15      Perform Capillary Blood Glucose checks as needed.  Exercise Prescription Changes:   Exercise Comments:   Exercise Goals and Review:      Exercise Goals    Row Name 03/29/17 1448             Exercise Goals   Increase Physical Activity Yes       Intervention Provide advice, education, support and counseling about physical activity/exercise needs.;Develop an individualized exercise prescription for aerobic and resistive training based on initial evaluation findings, risk stratification, comorbidities and participant's personal goals.       Expected Outcomes Achievement of increased cardiorespiratory fitness and enhanced flexibility, muscular endurance and strength shown through measurements of functional capacity and personal statement of  participant.       Increase Strength and Stamina Yes       Intervention Provide advice, education, support and counseling about physical activity/exercise needs.;Develop an individualized exercise prescription for aerobic and resistive training based on initial evaluation findings, risk stratification, comorbidities and participant's personal goals.       Expected Outcomes Achievement of increased cardiorespiratory fitness and enhanced flexibility, muscular endurance and strength shown through measurements of functional capacity and personal statement of participant.       Able to understand and use rate of perceived exertion (RPE) scale Yes       Intervention Provide education and explanation on how to use RPE scale       Expected Outcomes Short Term: Able to use RPE daily in rehab to express subjective intensity level;Long Term:  Able to use RPE to guide intensity level when exercising independently       Able to understand and use Dyspnea scale Yes       Intervention Provide education and explanation on how to use Dyspnea scale       Expected Outcomes Short Term: Able to use Dyspnea scale daily in rehab to express subjective sense of shortness of breath during exertion;Long Term: Able to use Dyspnea scale to guide intensity level when exercising independently       Knowledge and understanding of Target Heart Rate Range (THRR) Yes       Intervention Provide education and explanation of THRR including how the numbers were predicted and where they are located for reference       Expected Outcomes Short Term: Able to use daily as guideline for intensity in rehab;Long Term: Able to use THRR to govern intensity when exercising independently       Able to  check pulse independently Yes       Intervention Provide education and demonstration on how to check pulse in carotid and radial arteries.;Review the importance of being able to check your own pulse for safety during independent exercise       Expected  Outcomes Short Term: Able to explain why pulse checking is important during independent exercise;Long Term: Able to check pulse independently and accurately       Understanding of Exercise Prescription Yes       Intervention Provide education, explanation, and written materials on patient's individual exercise prescription       Expected Outcomes Short Term: Able to explain program exercise prescription;Long Term: Able to explain home exercise prescription to exercise independently          Exercise Goals Re-Evaluation :    Discharge Exercise Prescription (Final Exercise Prescription Changes):   Nutrition:  Target Goals: Understanding of nutrition guidelines, daily intake of sodium 1500mg , cholesterol 200mg , calories 30% from fat and 7% or less from saturated fats, daily to have 5 or more servings of fruits and vegetables.  Biometrics:     Pre Biometrics - 03/29/17 1415      Pre Biometrics   Height 6' (1.829 m)   Weight (!)  312 lb 2.7 oz (141.6 kg)   Waist Circumference 54 inches   Hip Circumference 52 inches   Waist to Hip Ratio 1.04 %   BMI (Calculated) 42.33   Triceps Skinfold 17 mm   % Body Fat 39.6 %   Grip Strength 78.67 kg   Flexibility 0 in   Single Leg Stand 0 seconds       Nutrition Therapy Plan and Nutrition Goals:     Nutrition Therapy & Goals - 03/29/17 1457      Personal Nutrition Goals   Nutrition Goal To continue to eat heart healthy diet.    Additional Goals? No     Intervention Plan   Expected Outcomes Short Term Goal: A plan has been developed with personal nutrition goals set during dietitian appointment.;Long Term Goal: Adherence to prescribed nutrition plan.      Nutrition Discharge: Rate Your Plate Scores:     Nutrition Assessments - 03/29/17 1459      MEDFICTS Scores   Pre Score 62      Nutrition Goals Re-Evaluation:   Nutrition Goals Discharge (Final Nutrition Goals Re-Evaluation):   Psychosocial: Target Goals:  Acknowledge presence or absence of significant depression and/or stress, maximize coping skills, provide positive support system. Participant is able to verbalize types and ability to use techniques and skills needed for reducing stress and depression.  Initial Review & Psychosocial Screening:     Initial Psych Review & Screening - 03/29/17 1500      Initial Review   Current issues with None Identified     Family Dynamics   Good Support System? Yes     Barriers   Psychosocial barriers to participate in program There are no identifiable barriers or psychosocial needs.     Screening Interventions   Interventions Encouraged to exercise      Quality of Life Scores:     Quality of Life - 03/29/17 1416      Quality of Life Scores   Health/Function Pre 12.9 %   Socioeconomic Pre 18.75 %   Psych/Spiritual Pre 18.64 %   Family Pre 15.6 %   GLOBAL Pre 15.59 %      PHQ-9: Recent Review Flowsheet Data    Depression screen PHQ  2/9 03/29/2017 08/19/2015 08/19/2015   Decreased Interest 0 0 0   Down, Depressed, Hopeless 0 0 0   PHQ - 2 Score 0 0 0   Altered sleeping 1 - -   Tired, decreased energy 2 - -   Change in appetite 0 - -   Feeling bad or failure about yourself  0 - -   Trouble concentrating 0 - -   Moving slowly or fidgety/restless 0 - -   Suicidal thoughts 0 - -   PHQ-9 Score 3 - -   Difficult doing work/chores Somewhat difficult - -     Interpretation of Total Score  Total Score Depression Severity:  1-4 = Minimal depression, 5-9 = Mild depression, 10-14 = Moderate depression, 15-19 = Moderately severe depression, 20-27 = Severe depression   Psychosocial Evaluation and Intervention:     Psychosocial Evaluation - 03/29/17 1501      Psychosocial Evaluation & Interventions   Interventions Encouraged to exercise with the program and follow exercise prescription   Continue Psychosocial Services  No Follow up required      Psychosocial  Re-Evaluation:   Psychosocial Discharge (Final Psychosocial Re-Evaluation):   Vocational Rehabilitation: Provide vocational rehab assistance to qualifying candidates.   Vocational Rehab Evaluation & Intervention:     Vocational Rehab - 03/29/17 1447      Initial Vocational Rehab Evaluation & Intervention   Assessment shows need for Vocational Rehabilitation No      Education: Education Goals: Education classes will be provided on a weekly basis, covering required topics. Participant will state understanding/return demonstration of topics presented.  Learning Barriers/Preferences:     Learning Barriers/Preferences - 03/29/17 1444      Learning Barriers/Preferences   Learning Barriers None   Learning Preferences Pictoral;Written Material;Computer/Internet      Education Topics: Hypertension, Hypertension Reduction -Define heart disease and high blood pressure. Discus how high blood pressure affects the body and ways to reduce high blood pressure.   Exercise and Your Heart -Discuss why it is important to exercise, the FITT principles of exercise, normal and abnormal responses to exercise, and how to exercise safely.   Angina -Discuss definition of angina, causes of angina, treatment of angina, and how to decrease risk of having angina.   Cardiac Medications -Review what the following cardiac medications are used for, how they affect the body, and side effects that may occur when taking the medications.  Medications include Aspirin, Beta blockers, calcium channel blockers, ACE Inhibitors, angiotensin receptor blockers, diuretics, digoxin, and antihyperlipidemics.   Congestive Heart Failure -Discuss the definition of CHF, how to live with CHF, the signs and symptoms of CHF, and how keep track of weight and sodium intake.   Heart Disease and Intimacy -Discus the effect sexual activity has on the heart, how changes occur during intimacy as we age, and safety during  sexual activity.   Smoking Cessation / COPD -Discuss different methods to quit smoking, the health benefits of quitting smoking, and the definition of COPD.   Nutrition I: Fats -Discuss the types of cholesterol, what cholesterol does to the heart, and how cholesterol levels can be controlled.   Nutrition II: Labels -Discuss the different components of food labels and how to read food label   Heart Parts and Heart Disease -Discuss the anatomy of the heart, the pathway of blood circulation through the heart, and these are affected by heart disease.   Stress I: Signs and Symptoms -Discuss the causes of stress, how stress may lead to  anxiety and depression, and ways to limit stress.   Stress II: Relaxation -Discuss different types of relaxation techniques to limit stress.   Warning Signs of Stroke / TIA -Discuss definition of a stroke, what the signs and symptoms are of a stroke, and how to identify when someone is having stroke.   Knowledge Questionnaire Score:     Knowledge Questionnaire Score - 03/29/17 1445      Knowledge Questionnaire Score   Pre Score 19/24      Core Components/Risk Factors/Patient Goals at Admission:     Personal Goals and Risk Factors at Admission - 03/29/17 1459      Core Components/Risk Factors/Patient Goals on Admission    Weight Management Obesity   Improve shortness of breath with ADL's Yes   Intervention Provide education, individualized exercise plan and daily activity instruction to help decrease symptoms of SOB with activities of daily living.   Expected Outcomes Short Term: Achieves a reduction of symptoms when performing activities of daily living.   Personal Goal Other Yes   Personal Goal Keep living   Intervention Attend [facility] 3 x week and supplement exercise 2 x week at home   Expected Outcomes Achieve personal goals.       Core Components/Risk Factors/Patient Goals Review:    Core Components/Risk Factors/Patient Goals at  Discharge (Final Review):    ITP Comments:     ITP Comments    Row Name 03/29/17 1456 04/07/17 0740         ITP Comments Mr. Goldsborough is a 71 year old male with PVD, and extreme SOB with exertion. He has been to the doctor about his SOB.  Patient new to program. Plans to start Wednesday 04/07/17.         Comments: ITP 30 Day REVIEW Patient new to program. Plans to start Wednesday 04/07/17.

## 2017-04-07 NOTE — Progress Notes (Signed)
Patient was given take home exercise plan today. He claimed that he understood. Target heart rate range was discussed along with safe ways to exercise when not here at [facility]. Patient plans to do some activities at home when not here at [facility].

## 2017-04-09 ENCOUNTER — Encounter (HOSPITAL_COMMUNITY)
Admission: RE | Admit: 2017-04-09 | Discharge: 2017-04-09 | Disposition: A | Discharge status: Home / Self Care | Payer: Medicare HMO | Source: Ambulatory Visit | Attending: Cardiology | Admitting: Cardiology

## 2017-04-09 DIAGNOSIS — Z955 Presence of coronary angioplasty implant and graft: Secondary | ICD-10-CM

## 2017-04-09 DIAGNOSIS — I214 Non-ST elevation (NSTEMI) myocardial infarction: Secondary | ICD-10-CM | POA: Diagnosis not present

## 2017-04-09 NOTE — Progress Notes (Signed)
Daily Session Note  Patient Details  Name: [name] [name] MRN: [id number] Date of Birth: Apr 06, 1946 Referring Provider:     CARDIAC REHAB PHASE II ORIENTATION from 03/29/2017 in [place]  Referring Provider  Dr. Beau Fanny      Encounter Date: 04/09/2017  Check In:     Session Check In - 04/09/17 0930      Check-In   Location [facility]   Staff Present Aundra Dubin, RN, BSN;Gregory Luther Parody, BS, EP, Exercise Physiologist   Supervising physician immediately available to respond to emergencies See telemetry face sheet for immediately available MD   Medication changes reported     No   Fall or balance concerns reported    No   Warm-up and Cool-down Performed as group-led instruction   Resistance Training Performed Yes   VAD Patient? No     Pain Assessment   Currently in Pain? No/denies   Pain Score 0-No pain   Multiple Pain Sites No      Capillary Blood Glucose: No results found for this or any previous visit (from the past 24 hour(s)).    History  Smoking Status  . Former Smoker  . Packs/day: 1.00  . Years: 30.00  . Types: Cigarettes  . Quit date: 08/04/2007  Smokeless Tobacco  . Never Used    Goals Met:  Independence with exercise equipment Exercise tolerated well No report of cardiac concerns or symptoms Strength training completed today  Goals Unmet:  Not Applicable  Comments: Check out 1030.   Dr. Kate Sable is Medical Director for [facility].

## 2017-04-12 ENCOUNTER — Encounter (HOSPITAL_COMMUNITY)
Admission: RE | Admit: 2017-04-12 | Discharge: 2017-04-12 | Disposition: A | Discharge status: Home / Self Care | Payer: Medicare HMO | Source: Ambulatory Visit | Attending: Cardiology | Admitting: Cardiology

## 2017-04-12 DIAGNOSIS — I214 Non-ST elevation (NSTEMI) myocardial infarction: Secondary | ICD-10-CM | POA: Diagnosis not present

## 2017-04-12 DIAGNOSIS — Z955 Presence of coronary angioplasty implant and graft: Secondary | ICD-10-CM

## 2017-04-12 NOTE — Progress Notes (Signed)
Daily Session Note  Patient Details  Name: [name] [name] MRN: [id number] Date of Birth: 1946/06/03 Referring Provider:     CARDIAC REHAB PHASE II ORIENTATION from 03/29/2017 in [place]  Referring Provider  Dr. Beau Fanny      Encounter Date: 04/12/2017  Check In:     Session Check In - 04/12/17 0930      Check-In   Location [facility]   Staff Present Aundra Dubin, RN, BSN;Gregory Luther Parody, BS, EP, Exercise Physiologist   Supervising physician immediately available to respond to emergencies See telemetry face sheet for immediately available MD   Medication changes reported     No   Fall or balance concerns reported    No   Warm-up and Cool-down Performed as group-led instruction   Resistance Training Performed Yes   VAD Patient? No     Pain Assessment   Currently in Pain? No/denies   Pain Score 0-No pain   Multiple Pain Sites No      Capillary Blood Glucose: No results found for this or any previous visit (from the past 24 hour(s)).    History  Smoking Status  . Former Smoker  . Packs/day: 1.00  . Years: 30.00  . Types: Cigarettes  . Quit date: 08/04/2007  Smokeless Tobacco  . Never Used    Goals Met:  Independence with exercise equipment Exercise tolerated well No report of cardiac concerns or symptoms Strength training completed today  Goals Unmet:  Not Applicable  Comments: Check out 1030.   Dr. Kate Sable is Medical Director for [facility] Card[facility] and Pulmonary Re[facility].

## 2017-04-13 ENCOUNTER — Other Ambulatory Visit: Payer: Self-pay

## 2017-04-13 ENCOUNTER — Emergency Department (HOSPITAL_COMMUNITY)
Admission: EM | Admit: 2017-04-13 | Discharge: 2017-04-13 | Disposition: A | Discharge status: Home / Self Care | Payer: Medicare HMO | Attending: Emergency Medicine | Admitting: Emergency Medicine

## 2017-04-13 ENCOUNTER — Emergency Department (HOSPITAL_COMMUNITY): Payer: Medicare HMO

## 2017-04-13 ENCOUNTER — Encounter (HOSPITAL_COMMUNITY): Payer: Self-pay

## 2017-04-13 DIAGNOSIS — R0602 Shortness of breath: Secondary | ICD-10-CM | POA: Diagnosis present

## 2017-04-13 DIAGNOSIS — I130 Hypertensive heart and chronic kidney disease with heart failure and stage 1 through stage 4 chronic kidney disease, or unspecified chronic kidney disease: Secondary | ICD-10-CM | POA: Insufficient documentation

## 2017-04-13 DIAGNOSIS — N184 Chronic kidney disease, stage 4 (severe): Secondary | ICD-10-CM | POA: Insufficient documentation

## 2017-04-13 DIAGNOSIS — I5042 Chronic combined systolic (congestive) and diastolic (congestive) heart failure: Secondary | ICD-10-CM | POA: Insufficient documentation

## 2017-04-13 DIAGNOSIS — E1140 Type 2 diabetes mellitus with diabetic neuropathy, unspecified: Secondary | ICD-10-CM | POA: Insufficient documentation

## 2017-04-13 DIAGNOSIS — Z7902 Long term (current) use of antithrombotics/antiplatelets: Secondary | ICD-10-CM | POA: Insufficient documentation

## 2017-04-13 DIAGNOSIS — E1122 Type 2 diabetes mellitus with diabetic chronic kidney disease: Secondary | ICD-10-CM | POA: Insufficient documentation

## 2017-04-13 DIAGNOSIS — I252 Old myocardial infarction: Secondary | ICD-10-CM | POA: Diagnosis not present

## 2017-04-13 DIAGNOSIS — Z794 Long term (current) use of insulin: Secondary | ICD-10-CM | POA: Diagnosis not present

## 2017-04-13 DIAGNOSIS — Z87891 Personal history of nicotine dependence: Secondary | ICD-10-CM | POA: Insufficient documentation

## 2017-04-13 DIAGNOSIS — I2510 Atherosclerotic heart disease of native coronary artery without angina pectoris: Secondary | ICD-10-CM | POA: Insufficient documentation

## 2017-04-13 DIAGNOSIS — Z8572 Personal history of non-Hodgkin lymphomas: Secondary | ICD-10-CM | POA: Insufficient documentation

## 2017-04-13 DIAGNOSIS — E039 Hypothyroidism, unspecified: Secondary | ICD-10-CM | POA: Diagnosis not present

## 2017-04-13 DIAGNOSIS — Z79899 Other long term (current) drug therapy: Secondary | ICD-10-CM | POA: Diagnosis not present

## 2017-04-13 DIAGNOSIS — R0981 Nasal congestion: Secondary | ICD-10-CM | POA: Insufficient documentation

## 2017-04-13 DIAGNOSIS — Z7982 Long term (current) use of aspirin: Secondary | ICD-10-CM | POA: Diagnosis not present

## 2017-04-13 LAB — COMPREHENSIVE METABOLIC PANEL
ALK PHOS: 73 U/L (ref 38–126)
ALT: 20 U/L (ref 17–63)
AST: 14 U/L — AB (ref 15–41)
Albumin: 3.7 g/dL (ref 3.5–5.0)
Anion gap: 8 (ref 5–15)
BUN: 26 mg/dL — AB (ref 6–20)
CHLORIDE: 106 mmol/L (ref 101–111)
CO2: 26 mmol/L (ref 22–32)
Calcium: 8.9 mg/dL (ref 8.9–10.3)
Creatinine, Ser: 2.29 mg/dL — ABNORMAL HIGH (ref 0.61–1.24)
GFR calc non Af Amer: 27 mL/min — ABNORMAL LOW (ref >60)
GFR, EST AFRICAN AMERICAN: 32 mL/min — AB (ref >60)
Glucose, Bld: 92 mg/dL (ref 65–99)
Potassium: 3.6 mmol/L (ref 3.5–5.1)
Sodium: 140 mmol/L (ref 135–145)
Total Bilirubin: 1.1 mg/dL (ref 0.3–1.2)
Total Protein: 6.8 g/dL (ref 6.5–8.1)

## 2017-04-13 LAB — URINALYSIS, ROUTINE W REFLEX MICROSCOPIC
BACTERIA UA: NONE SEEN
BILIRUBIN URINE: NEGATIVE
Glucose, UA: NEGATIVE mg/dL
HGB URINE DIPSTICK: NEGATIVE
Ketones, ur: NEGATIVE mg/dL
LEUKOCYTES UA: NEGATIVE
NITRITE: NEGATIVE
PROTEIN: 30 mg/dL — AB
Specific Gravity, Urine: 1.014 (ref 1.005–1.030)
Squamous Epithelial / LPF: NONE SEEN
pH: 5 (ref 5.0–8.0)

## 2017-04-13 LAB — CBC WITH DIFFERENTIAL/PLATELET
Basophils Absolute: 0.1 10*3/uL (ref 0.0–0.1)
Basophils Relative: 1 %
EOS PCT: 3 %
Eosinophils Absolute: 0.2 10*3/uL (ref 0.0–0.7)
HCT: 39.8 % (ref 39.0–52.0)
HEMOGLOBIN: 13.2 g/dL (ref 13.0–17.0)
LYMPHS ABS: 1 10*3/uL (ref 0.7–4.0)
LYMPHS PCT: 13 %
MCH: 28.9 pg (ref 26.0–34.0)
MCHC: 33.2 g/dL (ref 30.0–36.0)
MCV: 87.3 fL (ref 78.0–100.0)
Monocytes Absolute: 0.8 10*3/uL (ref 0.1–1.0)
Monocytes Relative: 10 %
NEUTROS PCT: 73 %
Neutro Abs: 6 10*3/uL (ref 1.7–7.7)
Platelets: 143 10*3/uL — ABNORMAL LOW (ref 150–400)
RBC: 4.56 MIL/uL (ref 4.22–5.81)
RDW: 15.8 % — ABNORMAL HIGH (ref 11.5–15.5)
WBC: 8.1 10*3/uL (ref 4.0–10.5)

## 2017-04-13 LAB — TROPONIN I: Troponin I: 0.04 ng/mL (ref <0.03)

## 2017-04-13 LAB — BRAIN NATRIURETIC PEPTIDE: B Natriuretic Peptide: 322 pg/mL — ABNORMAL HIGH (ref 0.0–100.0)

## 2017-04-13 MED ORDER — SALINE SPRAY 0.65 % NA SOLN
1.0000 | NASAL | Status: DC | PRN
  Administered 2017-04-13: 1 via NASAL
  Filled 2017-04-13: qty 44

## 2017-04-13 MED ORDER — IPRATROPIUM-ALBUTEROL 0.5-2.5 (3) MG/3ML IN SOLN
3.0000 mL | Freq: Once | RESPIRATORY_TRACT | Status: AC
  Administered 2017-04-13: 3 mL via RESPIRATORY_TRACT
  Filled 2017-04-13: qty 3

## 2017-04-13 MED ORDER — SODIUM CHLORIDE-SODIUM BICARB 2300-700 MG NA KIT: 1.0000 | PACK | Freq: Two times a day (BID) | NASAL | 0 refills | Status: DC

## 2017-04-13 NOTE — ED Notes (Signed)
CRITICAL VALUE ALERT  Critical Value:  Troponin 0.04  Date & Time Notied: 04/13/17 1416  Provider Notified: Dr. Gilford Raid   Orders Received/Actions taken:Notified MD

## 2017-04-13 NOTE — ED Triage Notes (Addendum)
Reports of shortness of breath and non-productive cough. States he was admitted recently for same. Reports of last night feeling more short of breath. Denies pain or fevers. NAD in triage.

## 2017-04-13 NOTE — ED Provider Notes (Signed)
[facility] DEPT Provider Note   CSN: [id number] Arrival date & time: 04/13/17  1121     History   Chief Complaint Chief Complaint  Patient presents with  . Shortness of Breath    HPI [name] [name] is a 71 y.o. male.  Pt presents to the ED today with sob.  The pt was discharged from the hospital on 8/31 for CHF.  He also had a NSTEMI  in June and had another admission from 8/18-19 for CHF.  The pt said that he gets very sob when he walks.  His lasix wakes him up from sleep to urinate.  He gets so sob walking back to the bed that he can't go back to sleep.  The pt also feels like his sinuses are full.  He has been on multiple sinus medications without help.  He denies any cp.        Past Medical History:  Diagnosis Date  . CAD (coronary artery disease)    S/P NSTEMI June 2018  . Cancer (HCC)    low grade non hodgkin's lymphoma  . Chronic kidney disease    stage III kidney disease   . Diabetes mellitus   . Hyperlipidemia   . Hypertension   . Hypothyroidism   . Lymphoma ([place])   . Morbid obesity ([place])   . Peripheral vascular disease (HCC)    neuropathy in toes   . Pneumonia    hx of   . S/P biopsy   . Shortness of breath    due ot pain in knees   . Sleep apnea    sleep study 3/29 13 ? location     Patient Active Problem List   Diagnosis Date Noted  . Acute on chronic systolic and diastolic heart failure, NYHA class 1 ([place]) 04/01/2017  . CAD (coronary artery disease) 04/01/2017  . SOB (shortness of breath) 03/21/2017  . HTN (hypertension) 03/21/2017  . NSTEMI (non-ST elevated myocardial infarction) ([place]) 01/19/2017  . NSTEMI, initial episode of care ([place]) 01/19/2017  . Sepsis ([place]) 07/24/2016  . Elevated troponin I level 07/24/2016  . Fever 07/24/2016  . Lactic acidosis 07/24/2016  . Adjustment insomnia 05/18/2016  . Erectile dysfunction 12/19/2015  . Sleep apnea 09/30/2015  . Osteoarthritis 09/30/2015  . Hypothyroidism 09/30/2015  . Hypogonadism in  male 09/30/2015  . Diabetic neuropathy ([place]) 09/30/2015  . Chronic pain of left knee 09/30/2015  . Benign essential hypertension 09/30/2015  . Acute on chronic kidney failure ([place]) 05/14/2015  . Diarrhea 05/14/2015  . Dehydration 05/14/2015  . Hypokalemia 05/14/2015  . 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 Pelvic mass in male   . Varices, esophageal ([place])   . Colonic mass   . Abnormal CT scan, pelvis   . Bladder mass   . Elevated liver enzymes   . Elevated LFTs   . Abdominal pain 07/23/2014  . Chronic kidney disease Stage IV 07/23/2014  . Morbid obesity ([place]) 07/23/2014  . Type 2 diabetes mellitus with stage 4 chronic kidney disease ([place]) 07/23/2014  . Hypertension 07/23/2014  . S/P right TKA 11/11/2011    Past Surgical History:  Procedure Laterality Date  . CHOLECYSTECTOMY  1992  . COLONOSCOPY N/A 09/03/2014   SLF:six colon polyps removed/small internal hemorrhoids  . CORONARY STENT INTERVENTION N/A 01/21/2017   Procedure: Coronary Stent Intervention;  Surgeon: Nelva Bush, MD;  Location: [facility];  Service: Cardiovascular;  Laterality: N/A;  . ESOPHAGOGASTRODUODENOSCOPY N/A 09/03/2014   SLF: mild gastritis/few gastric polyps  .  LEFT HEART CATH AND CORONARY ANGIOGRAPHY N/A 01/20/2017   Procedure: Left Heart Cath and Coronary Angiography;  Surgeon: Jettie Booze, MD;  Location: [facility];  Service: Cardiovascular;  Laterality: N/A;  . TOTAL KNEE ARTHROPLASTY  11/10/2011   Procedure: TOTAL KNEE ARTHROPLASTY;  Surgeon: Mauri Pole, MD;  Location: [facility];  Service: Orthopedics;  Laterality: Right;       Home Medications    Prior to Admission medications   Medication Sig Start Date End Date Taking? Authorizing Provider  albuterol (PROVENTIL HFA;VENTOLIN HFA) 108 (90 Base) MCG/ACT inhaler Inhale 2 puffs into the lungs every 6 (six) hours as needed for wheezing or shortness of breath. 03/21/17  Yes Waldron Session, MD  amLODipine (NORVASC) 10 MG  tablet Take 1 tablet (10 mg total) by mouth daily. 03/21/17 03/21/18 Yes Waldron Session, MD  aspirin 81 MG tablet Take 81 mg by mouth daily.   Yes [provider]  bisoprolol (ZEBETA) 5 MG tablet Take 1 tablet (5 mg total) by mouth daily. 03/22/17  Yes Waldron Session, MD  budesonide-formoterol (SYMBICORT) 80-4.5 MCG/ACT inhaler Inhale 2 puffs into the lungs 2 (two) times daily.  03/23/17  Yes [provider]  calcipotriene (DOVONOX) 0.005 % cream Apply 1 application topically 2 (two) times daily.  01/08/17  Yes [provider]  clopidogrel (PLAVIX) 75 MG tablet Take 1 tablet (75 mg total) by mouth daily. 01/23/17  Yes Florencia Reasons, MD  fluticasone [facility]) 50 MCG/ACT nasal spray Place 1 spray into both nostrils daily.   Yes [provider]  furosemide (LASIX) 40 MG tablet Take 1 tablet (40 mg total) by mouth daily. 04/02/17 04/02/18 Yes Orvan Falconer, MD  gabapentin (NEURONTIN) 100 MG capsule Take 100 mg by mouth at bedtime.   Yes [provider]  insulin aspart (NOVOLOG FLEXPEN) 100 UNIT/ML FlexPen Inject into the skin. Sliding scale As needed for blood sugar   Yes [provider]  insulin degludec (TRESIBA FLEXTOUCH) 100 UNIT/ML SOPN FlexTouch Pen Inject 0.5 mLs (50 Units total) into the skin daily at 10 pm. Patient taking differently: Inject 60 Units into the skin daily at 10 pm.  07/28/16  Yes Kathie Dike, MD  isosorbide mononitrate (IMDUR) 30 MG 24 hr tablet Take 1 tablet (30 mg total) by mouth daily. 04/02/17  Yes Orvan Falconer, MD  levothyroxine (SYNTHROID, LEVOTHROID) 100 MCG tablet Take 100 mcg by mouth daily before breakfast.   Yes [provider]  levothyroxine (SYNTHROID, LEVOTHROID) 200 MCG tablet Take 200 mcg by mouth daily before breakfast.   Yes [provider]  losartan (COZAAR) 100 MG tablet Take 100 mg by mouth. 02/02/17  Yes [provider]  montelukast (SINGULAIR) 10 MG tablet Take 10 mg by mouth. 08/07/16  Yes [provider]  nitroGLYCERIN (NITROSTAT) 0.4 MG SL tablet Place 1 tablet (0.4 mg total) under the tongue every 5 (five) minutes as needed for chest pain. Up to 3 doses. If no relief after the 3rd dose, proceed to the ED for an evaluation 03/21/17 06/19/17 Yes Waldron Session, MD  omega-3 acid ethyl esters (LOVAZA) 1 g capsule Take 1 capsule by mouth daily.  11/26/16  Yes [provider]  simvastatin (ZOCOR) 5 MG tablet Take 5 mg by mouth daily. 09/14/16  Yes [provider]  traZODone (DESYREL) 50 MG tablet TAKE 1 TO 2 TABLETS AT BEDTIME FOR INSOMNIA 09/30/16  Yes [provider]  Sodium Chloride-Sodium Bicarb (NETI POT SINUS [place]) 2300-700 MG KIT Place  1 Dose into the nose 2 (two) times daily. 04/13/17   Isla Pence, MD    Family History Family History  Problem Relation Age of Onset  . Cancer Mother        breast and lung  . Cancer Father        bladder  . Cancer Maternal Uncle        prostate  . Cancer Paternal Uncle        esophagus  . Colon cancer Neg Hx     Social History Social History  Substance Use Topics  . Smoking status: Former Smoker    Packs/day: 1.00    Years: 30.00    Types: Cigarettes    Quit date: 08/04/2007  . Smokeless tobacco: Never Used  . Alcohol use No     Allergies   Patient has no known allergies.   Review of Systems Review of Systems  Respiratory: Positive for shortness of breath.   All other systems reviewed and are negative.    Physical Exam Updated Vital Signs BP (!) 112/53   Pulse (!) 47   Temp 98.4 F (36.9 C) (Oral)   Resp 20   Ht 6' (1.829 m)   Wt 136.1 kg (300 lb)   SpO2 100%   BMI 40.69 kg/m   Physical Exam  Constitutional: He is oriented to person, place, and time. He appears well-developed and well-nourished.  HENT:  Head: Normocephalic and atraumatic.  Right Ear: External ear normal.  Left Ear: External ear normal.  Nose: Nose normal.  Mouth/Throat: Oropharynx is clear and moist.  Eyes:  Pupils are equal, round, and reactive to light. Conjunctivae and EOM are normal.  Neck: Normal range of motion. Neck supple.  Cardiovascular: Normal rate, regular rhythm, normal heart sounds and intact distal pulses.   Pulmonary/Chest: Effort normal. He has rales.  Abdominal: Soft. Bowel sounds are normal.  Musculoskeletal: Normal range of motion.  Neurological: He is alert and oriented to person, place, and time.  Skin: Skin is warm.  Psychiatric: He has a normal mood and affect. His behavior is normal. Judgment and thought content normal.  Nursing note and vitals reviewed.    ED Treatments / Results  Labs (all labs ordered are listed, but only abnormal results are displayed) Labs Reviewed  COMPREHENSIVE METABOLIC PANEL - Abnormal; Notable for the following:       Result Value   BUN 26 (*)    Creatinine, Ser 2.29 (*)    AST 14 (*)    GFR calc non Af Amer 27 (*)    GFR calc Af Amer 32 (*)    All other components within normal limits  CBC WITH DIFFERENTIAL/PLATELET - Abnormal; Notable for the following:    RDW 15.8 (*)    Platelets 143 (*)    All other components within normal limits  TROPONIN I - Abnormal; Notable for the following:    Troponin I 0.04 (*)    All other components within normal limits  BRAIN NATRIURETIC PEPTIDE - Abnormal; Notable for the following:    B Natriuretic Peptide 322.0 (*)    All other components within normal limits  URINALYSIS, ROUTINE W REFLEX MICROSCOPIC - Abnormal; Notable for the following:    Protein, ur 30 (*)    All other components within normal limits    EKG  ekg shows no changes  Radiology Dg Chest 2 View  Result Date: 04/13/2017 CLINICAL DATA:  Shortness of breath, cough EXAM: CHEST  2 VIEW COMPARISON:  Chest x-ray of 04/01/2017 FINDINGS: There has been some improvement with decrease in interstitial edema and decrease in volume of small pleural effusions bilaterally. Mild cardiomegaly is stable. No acute bony abnormality is seen.  IMPRESSION: Some interval improvement with decreasing interstitial edema and small effusions. Electronically Signed   By: Ivar Drape M.D.   On: 04/13/2017 12:13    Procedures Procedures (including critical care time)  Medications Ordered in ED Medications  sodium chloride (OCEAN) 0.65 % nasal spray 1 spray (not administered)  ipratropium-albuterol (DUONEB) 0.5-2.5 (3) MG/3ML nebulizer solution 3 mL (3 mLs Nebulization Given 04/13/17 1336)     Initial Impression / Assessment and Plan / ED Course  I have reviewed the triage vital signs and the nursing notes.  Pertinent labs & imaging results that were available during my care of the patient were reviewed by me and considered in my medical decision making (see chart for details).    Pt is able to ambulate with his walker off oxygen and feels good.  His main complaint is his sinus congestion and dryness.  The pt does not take saline spray, so I am going to have pt start taking this.  He knows to return if worse.  Final Clinical Impressions(s) / ED Diagnoses   Final diagnoses:  Sinus congestion  Chronic combined systolic and diastolic congestive heart failure (HCC)  CRI (chronic renal insufficiency), stage 4 (severe) (HCC)    New Prescriptions New Prescriptions   SODIUM CHLORIDE-SODIUM BICARB (NETI POT SINUS WASH) 2300-700 MG KIT    Place 1 Dose into the nose 2 (two) times daily.     Isla Pence, MD 04/13/17 1531

## 2017-04-13 NOTE — ED Notes (Signed)
Pt's O2 sat ranged from 89-92% on RA while ambulating with walker.

## 2017-04-14 ENCOUNTER — Encounter (HOSPITAL_COMMUNITY): Payer: Medicare HMO

## 2017-04-16 ENCOUNTER — Encounter (HOSPITAL_COMMUNITY): Payer: Medicare HMO

## 2017-04-19 ENCOUNTER — Encounter (HOSPITAL_COMMUNITY): Payer: Medicare HMO

## 2017-04-21 ENCOUNTER — Encounter (HOSPITAL_COMMUNITY)
Admission: RE | Admit: 2017-04-21 | Discharge: 2017-04-21 | Disposition: A | Discharge status: Home / Self Care | Payer: Medicare HMO | Source: Ambulatory Visit | Attending: Cardiology | Admitting: Cardiology

## 2017-04-21 DIAGNOSIS — I214 Non-ST elevation (NSTEMI) myocardial infarction: Secondary | ICD-10-CM | POA: Diagnosis not present

## 2017-04-21 DIAGNOSIS — Z955 Presence of coronary angioplasty implant and graft: Secondary | ICD-10-CM

## 2017-04-21 NOTE — Progress Notes (Signed)
Daily Session Note  Patient Details  Name: [name] [name] MRN: [id number] Date of Birth: [birth year] Referring Provider:     CARDIAC REHAB PHASE II ORIENTATION from 03/29/2017 in [facility]  Referring Provider  Dr. Beau Fanny      Encounter Date: 04/21/2017  Check In:     Session Check In - 04/21/17 0930      Check-In   Location [facility]   Staff Present Aundra Dubin, RN, BSN;Diane Coad, MS, EP, [facility], Exercise Physiologist;Gregory Luther Parody, BS, EP, Exercise Physiologist   Supervising physician immediately available to respond to emergencies See telemetry face sheet for immediately available MD   Medication changes reported     No   Fall or balance concerns reported    No   Warm-up and Cool-down Performed as group-led instruction   Resistance Training Performed Yes   VAD Patient? No     Pain Assessment   Currently in Pain? No/denies   Pain Score 0-No pain   Multiple Pain Sites No      Capillary Blood Glucose: No results found for this or any previous visit (from the past 24 hour(s)).    History  Smoking Status  . Former Smoker  . Packs/day: 1.00  . Years: 30.00  . Types: Cigarettes  . Quit date: 08/04/2007  Smokeless Tobacco  . Never Used    Goals Met:  Independence with exercise equipment Exercise tolerated well No report of cardiac concerns or symptoms Strength training completed today  Goals Unmet:  Not Applicable  Comments: Check out 1030.   Dr. Kate Sable is Medical Director for [facility] Card[facility].

## 2017-04-23 ENCOUNTER — Encounter (HOSPITAL_COMMUNITY): Payer: Medicare HMO

## 2017-04-26 ENCOUNTER — Encounter (HOSPITAL_COMMUNITY): Payer: Medicare HMO

## 2017-04-28 ENCOUNTER — Encounter (HOSPITAL_COMMUNITY): Payer: Medicare HMO

## 2017-04-28 NOTE — Progress Notes (Signed)
Discharge Progress Report  Patient Details  Name: [name] [name] MRN: [id number] Date of Birth: [birth year] Referring Provider:     CARDIAC REHAB PHASE II ORIENTATION from 03/29/2017 in [facility]  Referring Provider  Dr. Beau Fanny       Number of Visits: 5  Reason for Discharge:  Early Exit:  Personal  Smoking History:  History  Smoking Status  . Former Smoker  . Packs/day: 1.00  . Years: 30.00  . Types: Cigarettes  . Quit date: 08/04/2007  Smokeless Tobacco  . Never Used    Diagnosis:  NSTEMI (non-ST elevated myocardial infarction) ([place])  Status post coronary artery stent placement  ADL UCSD:   Initial Exercise Prescription:     Initial Exercise Prescription - 03/29/17 1400      Date of Initial Exercise RX and Referring Provider   Date 03/29/17   Referring Provider Dr. Beau Fanny     NuStep   Level 2   SPM 84   Minutes 15   METs 1.7     Arm Ergometer   Level 1.5   Watts 20   RPM 50   Minutes 20   METs 1.5     Prescription Details   Frequency (times per week) 3   Duration Progress to 30 minutes of continuous aerobic without signs/symptoms of physical distress     Intensity   THRR 40-80% of Max Heartrate 91-111-131   Ratings of Perceived Exertion 11-13   Perceived Dyspnea 0-4     Progression   Progression Continue progressive overload as per policy without signs/symptoms or physical distress.     Resistance Training   Training Prescription Yes   Weight 1   Reps 10-15      Discharge Exercise Prescription (Final Exercise Prescription Changes):     Exercise Prescription Changes - 04/19/17 1400      Response to Exercise   Blood Pressure (Admit) 138/62   Blood Pressure (Exercise) 140/70   Blood Pressure (Exit) 138/68   Heart Rate (Admit) 52 bpm   Heart Rate (Exercise) 47 bpm   Heart Rate (Exit) 60 bpm   Rating of Perceived Exertion (Exercise) 11   Duration Progress to 30 minutes of  aerobic without signs/symptoms of  physical distress   Intensity THRR New  92-111-132     Progression   Progression Continue to progress workloads to maintain intensity without signs/symptoms of physical distress.     Resistance Training   Training Prescription Yes   Weight 1   Reps 10-15     NuStep   Level 2   SPM 89   Minutes 15   METs 2.4     Arm Ergometer   Level 2   Watts 4   RPM 20   Minutes 20   METs 20     Home Exercise Plan   Plans to continue exercise at Home (comment)   Frequency Add 2 additional days to program exercise sessions.   Initial Home Exercises Provided 04/07/17      Functional Capacity:     6 Minute Walk    Row Name 03/29/17 1414         6 Minute Walk   Phase Initial        Psychological, QOL, Others - Outcomes: PHQ 2/9: Depression screen [facility] 2/9 03/29/2017 08/19/2015 08/19/2015  Decreased Interest 0 0 0  Down, Depressed, Hopeless 0 0 0  PHQ - 2 Score 0 0 0  Altered sleeping 1 - -  Tired, decreased  energy 2 - -  Change in appetite 0 - -  Feeling bad or failure about yourself  0 - -  Trouble concentrating 0 - -  Moving slowly or fidgety/restless 0 - -  Suicidal thoughts 0 - -  PHQ-9 Score 3 - -  Difficult doing work/chores Somewhat difficult - -    Quality of Life:     Quality of Life - 03/29/17 1416      Quality of Life Scores   Health/Function Pre 12.9 %   Socioeconomic Pre 18.75 %   Psych/Spiritual Pre 18.64 %   Family Pre 15.6 %   GLOBAL Pre 15.59 %      Personal Goals: Goals established at orientation with interventions provided to work toward goal.     Personal Goals and Risk Factors at Admission - 03/29/17 1459      Core Components/Risk Factors/Patient Goals on Admission    Weight Management Obesity   Improve shortness of breath with ADL's Yes   Intervention Provide education, individualized exercise plan and daily activity instruction to help decrease symptoms of SOB with activities of daily living.   Expected Outcomes Short Term: Achieves  a reduction of symptoms when performing activities of daily living.   Personal Goal Other Yes   Personal Goal Keep living   Intervention Attend CR 3 x week and supplement exercise 2 x week at home   Expected Outcomes Achieve personal goals.        Personal Goals Discharge:   Exercise Goals and Review:     Exercise Goals    Row Name 03/29/17 1448             Exercise Goals   Increase Physical Activity Yes       Intervention Provide advice, education, support and counseling about physical activity/exercise needs.;Develop an individualized exercise prescription for aerobic and resistive training based on initial evaluation findings, risk stratification, comorbidities and participant's personal goals.       Expected Outcomes Achievement of increased cardiorespiratory fitness and enhanced flexibility, muscular endurance and strength shown through measurements of functional capacity and personal statement of participant.       Increase Strength and Stamina Yes       Intervention Provide advice, education, support and counseling about physical activity/exercise needs.;Develop an individualized exercise prescription for aerobic and resistive training based on initial evaluation findings, risk stratification, comorbidities and participant's personal goals.       Expected Outcomes Achievement of increased cardiorespiratory fitness and enhanced flexibility, muscular endurance and strength shown through measurements of functional capacity and personal statement of participant.       Able to understand and use rate of perceived exertion (RPE) scale Yes       Intervention Provide education and explanation on how to use RPE scale       Expected Outcomes Short Term: Able to use RPE daily in rehab to express subjective intensity level;Long Term:  Able to use RPE to guide intensity level when exercising independently       Able to understand and use Dyspnea scale Yes       Intervention Provide  education and explanation on how to use Dyspnea scale       Expected Outcomes Short Term: Able to use Dyspnea scale daily in rehab to express subjective sense of shortness of breath during exertion;Long Term: Able to use Dyspnea scale to guide intensity level when exercising independently       Knowledge and understanding of Target Heart Rate Range (  THRR) Yes       Intervention Provide education and explanation of THRR including how the numbers were predicted and where they are located for reference       Expected Outcomes Short Term: Able to use daily as guideline for intensity in rehab;Long Term: Able to use THRR to govern intensity when exercising independently       Able to check pulse independently Yes       Intervention Provide education and demonstration on how to check pulse in carotid and radial arteries.;Review the importance of being able to check your own pulse for safety during independent exercise       Expected Outcomes Short Term: Able to explain why pulse checking is important during independent exercise;Long Term: Able to check pulse independently and accurately       Understanding of Exercise Prescription Yes       Intervention Provide education, explanation, and written materials on patient's individual exercise prescription       Expected Outcomes Short Term: Able to explain program exercise prescription;Long Term: Able to explain home exercise prescription to exercise independently          Nutrition & Weight - Outcomes:     Pre Biometrics - 03/29/17 1415      Pre Biometrics   Height 6' (1.829 m)   Weight (!)  312 lb 2.7 oz (141.6 kg)   Waist Circumference 54 inches   Hip Circumference 52 inches   Waist to Hip Ratio 1.04 %   BMI (Calculated) 42.33   Triceps Skinfold 17 mm   % Body Fat 39.6 %   Grip Strength 78.67 kg   Flexibility 0 in   Single Leg Stand 0 seconds       Nutrition:     Nutrition Therapy & Goals - 03/29/17 1457      Personal Nutrition Goals    Nutrition Goal To continue to eat heart healthy diet.    Additional Goals? No     Intervention Plan   Expected Outcomes Short Term Goal: A plan has been developed with personal nutrition goals set during dietitian appointment.;Long Term Goal: Adherence to prescribed nutrition plan.      Nutrition Discharge:     Nutrition Assessments - 03/29/17 1459      MEDFICTS Scores   Pre Score 62      Education Questionnaire Score:     Knowledge Questionnaire Score - 03/29/17 1445      Knowledge Questionnaire Score   Pre Score 19/24

## 2017-04-28 NOTE — Progress Notes (Signed)
Cardiac Individual Treatment Plan  Patient Details  Name: [name] [name] MRN: [id number] Date of Birth: July 07, 1946 Referring Provider:     CARDIAC REHAB PHASE II ORIENTATION from 03/29/2017 in [place]  Referring Provider  Dr. Beau Fanny      Initial Encounter Date:    CARDIAC REHAB PHASE II ORIENTATION from 03/29/2017 in [place]  Date  03/29/17  Referring Provider  Dr. Beau Fanny      Visit Diagnosis: NSTEMI (non-ST elevated myocardial infarction) [facility])  Status post coronary artery stent placement  Patient's Home Medications on Admission:  Current Outpatient Prescriptions:  .  albuterol (PROVENTIL HFA;VENTOLIN HFA) 108 (90 Base) MCG/ACT inhaler, Inhale 2 puffs into the lungs every 6 (six) hours as needed for wheezing or shortness of breath., Disp: 1 Inhaler, Rfl: 2 .  amLODipine (NORVASC) 10 MG tablet, Take 1 tablet (10 mg total) by mouth daily., Disp: 30 tablet, Rfl: 11 .  aspirin 81 MG tablet, Take 81 mg by mouth daily., Disp: , Rfl:  .  bisoprolol (ZEBETA) 5 MG tablet, Take 1 tablet (5 mg total) by mouth daily., Disp: 30 tablet, Rfl: 6 .  budesonide-formoterol (SYMBICORT) 80-4.5 MCG/ACT inhaler, Inhale 2 puffs into the lungs 2 (two) times daily. , Disp: , Rfl:  .  calcipotriene (DOVONOX) 0.005 % cream, Apply 1 application topically 2 (two) times daily. , Disp: , Rfl:  .  clopidogrel (PLAVIX) 75 MG tablet, Take 1 tablet (75 mg total) by mouth daily., Disp: 30 tablet, Rfl: 0 .  fluticasone (FLONASE) 50 MCG/ACT nasal spray, Place 1 spray into both nostrils daily., Disp: , Rfl:  .  furosemide (LASIX) 40 MG tablet, Take 1 tablet (40 mg total) by mouth daily., Disp: 30 tablet, Rfl: 11 .  gabapentin (NEURONTIN) 100 MG capsule, Take 100 mg by mouth at bedtime., Disp: , Rfl:  .  insulin aspart (NOVOLOG FLEXPEN) 100 UNIT/ML FlexPen, Inject into the skin. Sliding scale As needed for blood sugar, Disp: , Rfl:  .  insulin degludec (TRESIBA  FLEXTOUCH) 100 UNIT/ML SOPN FlexTouch Pen, Inject 0.5 mLs (50 Units total) into the skin daily at 10 pm. (Patient taking differently: Inject 60 Units into the skin daily at 10 pm. ), Disp: , Rfl:  .  isosorbide mononitrate (IMDUR) 30 MG 24 hr tablet, Take 1 tablet (30 mg total) by mouth daily., Disp: 30 tablet, Rfl: 1 .  levothyroxine (SYNTHROID, LEVOTHROID) 100 MCG tablet, Take 100 mcg by mouth daily before breakfast., Disp: , Rfl:  .  levothyroxine (SYNTHROID, LEVOTHROID) 200 MCG tablet, Take 200 mcg by mouth daily before breakfast., Disp: , Rfl:  .  losartan (COZAAR) 100 MG tablet, Take 100 mg by mouth., Disp: , Rfl:  .  montelukast (SINGULAIR) 10 MG tablet, Take 10 mg by mouth., Disp: , Rfl:  .  nitroGLYCERIN (NITROSTAT) 0.4 MG SL tablet, Place 1 tablet (0.4 mg total) under the tongue every 5 (five) minutes as needed for chest pain. Up to 3 doses. If no relief after the 3rd dose, proceed to the ED for an evaluation, Disp: 25 tablet, Rfl: 3 .  omega-3 acid ethyl esters (LOVAZA) 1 g capsule, Take 1 capsule by mouth daily. , Disp: , Rfl:  .  simvastatin (ZOCOR) 5 MG tablet, Take 5 mg by mouth daily., Disp: , Rfl:  .  Sodium Chloride-Sodium Bicarb (NETI POT SINUS WASH) 2300-700 MG KIT, Place 1 Dose into the nose 2 (two) times daily., Disp: 1 each, Rfl: 0 .  traZODone (DESYREL)  50 MG tablet, TAKE 1 TO 2 TABLETS AT BEDTIME FOR INSOMNIA, Disp: , Rfl:   Past Medical History: Past Medical History:  Diagnosis Date  . CAD (coronary artery disease)    S/P NSTEMI June 2018  . Cancer (HCC)    low grade non hodgkin's lymphoma  . Chronic kidney disease    stage III kidney disease   . Diabetes mellitus   . Hyperlipidemia   . Hypertension   . Hypothyroidism   . Lymphoma ([place])   . Morbid obesity ([place])   . Peripheral vascular disease (HCC)    neuropathy in toes   . Pneumonia    hx of   . S/P biopsy   . Shortness of breath    due ot pain in knees   . Sleep apnea    sleep study 3/29 13 ? location      Tobacco Use: History  Smoking Status  . Former Smoker  . Packs/day: 1.00  . Years: 30.00  . Types: Cigarettes  . Quit date: 08/04/2007  Smokeless Tobacco  . Never Used    Labs: Recent Review Flowsheet Data    Labs for ITP Cardiac and Pulmonary Rehab Latest Ref Rng & Units 07/21/2008 07/22/2008 07/23/2008 07/23/2014 01/20/2017   Cholestrol 0 - 200 mg/dL - 126 ATP III CLASSIFICATION: <200     mg/dL   Desirable 200-239  mg/dL   Borderline High >=240    mg/dL   High 124 ATP III CLASSIFICATION: <200     mg/dL   Desirable 200-239  mg/dL   Borderline High >=240    mg/dL   High - 116   LDLCALC 0 - 99 mg/dL - UNABLE TO CALCULATE IF TRIGLYCERIDE OVER 400 mg/dL Total Cholesterol/HDL:CHD Risk Coronary Heart Disease Risk Table Men   Women 1/2 Average Risk   3.4   3.3 25 Total Cholesterol/HDL:CHD Risk Coronary Heart Disease Risk Table Men   Women 1/2 Average Risk   3.4   3.3 - 50   HDL >40 mg/dL - 27(L) 27(L) - 31(L)   Trlycerides <150 mg/dL - 423(H) 362(H) - 174(H)   Hemoglobin A1c 4.8 - 5.6 % 11.8 (NOTE)   The ADA recommends the following therapeutic goal for glycemic   control related to Hgb A1C measurement:   Goal of Therapy:   < 7.0% Hgb A1C   Reference: American Diabetes Association: Clinical Practice   Recommendations 2008, Diabetes Care, 2008, 31:(Suppl 1).(H) - - SEE SEPARATE REPORT 6.6(H)   TCO2 0 - 100 mmol/L 20 - - - -      Capillary Blood Glucose: Lab Results  Component Value Date   GLUCAP 97 04/02/2017   GLUCAP 167 (H) 04/01/2017   GLUCAP 127 (H) 04/01/2017   GLUCAP 155 (H) 04/01/2017   GLUCAP 126 (H) 04/01/2017     Exercise Target Goals:    Exercise Program Goal: Individual exercise prescription set with THRR, safety & activity barriers. Participant demonstrates ability to understand and report RPE using BORG scale, to self-measure pulse accurately, and to acknowledge the importance of the exercise prescription.  Exercise Prescription Goal: Starting  with aerobic activity 30 plus minutes a day, 3 days per week for initial exercise prescription. Provide home exercise prescription and guidelines that participant acknowledges understanding prior to discharge.  Activity Barriers & Risk Stratification:   6 Minute Walk:     6 Minute Walk    Row Name 03/29/17 1414         6 Minute Walk   Phase Initial  Oxygen Initial Assessment:   Oxygen Re-Evaluation:   Oxygen Discharge (Final Oxygen Re-Evaluation):   Initial Exercise Prescription:     Initial Exercise Prescription - 03/29/17 1400      Date of Initial Exercise RX and Referring Provider   Date 03/29/17   Referring Provider Dr. Beau Fanny     NuStep   Level 2   SPM 84   Minutes 15   METs 1.7     Arm Ergometer   Level 1.5   Watts 20   RPM 50   Minutes 20   METs 1.5     Prescription Details   Frequency (times per week) 3   Duration Progress to 30 minutes of continuous aerobic without signs/symptoms of physical distress     Intensity   THRR 40-80% of Max Heartrate [phone]   Ratings of Perceived Exertion 11-13   Perceived Dyspnea 0-4     Progression   Progression Continue progressive overload as per policy without signs/symptoms or physical distress.     Resistance Training   Training Prescription Yes   Weight 1   Reps 10-15      Perform Capillary Blood Glucose checks as needed.  Exercise Prescription Changes:      Exercise Prescription Changes    Row Name 04/07/17 1000 04/19/17 1400           Response to Exercise   Blood Pressure (Admit)  - 138/62      Blood Pressure (Exercise)  - 140/70      Blood Pressure (Exit)  - 138/68      Heart Rate (Admit)  - 52 bpm      Heart Rate (Exercise)  - 47 bpm      Heart Rate (Exit)  - 60 bpm      Rating of Perceived Exertion (Exercise)  - 11      Duration  - Progress to 30 minutes of  aerobic without signs/symptoms of physical distress      Intensity  - THRR New  [id number]        Progression    Progression  - Continue to progress workloads to maintain intensity without signs/symptoms of physical distress.        Resistance Training   Training Prescription  - Yes      Weight  - 1      Reps  - 10-15        NuStep   Level  - 2      SPM  - 89      Minutes  - 15      METs  - 2.4        Arm Ergometer   Level  - 2      Watts  - 4      RPM  - 20      Minutes  - 20      METs  - 20        Home Exercise Plan   Plans to continue exercise at Home (comment) Home (comment)      Frequency Add 2 additional days to program exercise sessions. Add 2 additional days to program exercise sessions.      Initial Home Exercises Provided 04/07/17 04/07/17         Exercise Comments:      Exercise Comments    Row Name 04/07/17 1047 04/19/17 1451         Exercise Comments Patient was given take home exercise plan today. He claimed that  he understood. Target heart rate range was discussed along with safe ways to exercise when not here at [facility]. Patient plans to do some activities at home when not here at [facility].  Patient is doing well in [facility].          Exercise Goals and Review:      Exercise Goals    Row Name 03/29/17 1448             Exercise Goals   Increase Physical Activity Yes       Intervention Provide advice, education, support and counseling about physical activity/exercise needs.;Develop an individualized exercise prescription for aerobic and resistive training based on initial evaluation findings, risk stratification, comorbidities and participant's personal goals.       Expected Outcomes Achievement of increased cardiorespiratory fitness and enhanced flexibility, muscular endurance and strength shown through measurements of functional capacity and personal statement of participant.       Increase Strength and Stamina Yes       Intervention Provide advice, education, support and counseling about physical activity/exercise needs.;Develop an individualized exercise prescription  for aerobic and resistive training based on initial evaluation findings, risk stratification, comorbidities and participant's personal goals.       Expected Outcomes Achievement of increased cardiorespiratory fitness and enhanced flexibility, muscular endurance and strength shown through measurements of functional capacity and personal statement of participant.       Able to understand and use rate of perceived exertion (RPE) scale Yes       Intervention Provide education and explanation on how to use RPE scale       Expected Outcomes Short Term: Able to use RPE daily in rehab to express subjective intensity level;Long Term:  Able to use RPE to guide intensity level when exercising independently       Able to understand and use Dyspnea scale Yes       Intervention Provide education and explanation on how to use Dyspnea scale       Expected Outcomes Short Term: Able to use Dyspnea scale daily in rehab to express subjective sense of shortness of breath during exertion;Long Term: Able to use Dyspnea scale to guide intensity level when exercising independently       Knowledge and understanding of Target Heart Rate Range (THRR) Yes       Intervention Provide education and explanation of THRR including how the numbers were predicted and where they are located for reference       Expected Outcomes Short Term: Able to use daily as guideline for intensity in rehab;Long Term: Able to use THRR to govern intensity when exercising independently       Able to check pulse independently Yes       Intervention Provide education and demonstration on how to check pulse in carotid and radial arteries.;Review the importance of being able to check your own pulse for safety during independent exercise       Expected Outcomes Short Term: Able to explain why pulse checking is important during independent exercise;Long Term: Able to check pulse independently and accurately       Understanding of Exercise Prescription Yes        Intervention Provide education, explanation, and written materials on patient's individual exercise prescription       Expected Outcomes Short Term: Able to explain program exercise prescription;Long Term: Able to explain home exercise prescription to exercise independently          Exercise Goals Re-Evaluation :  Exercise Goals Re-Evaluation    [place] Name 04/19/17 1451             Exercise Goal Re-Evaluation   Exercise Goals Review Increase Physical Activity;Increase Strength and Stamina;Knowledge and understanding of Target Heart Rate Range (THRR)       Comments Patient is doing well in [facility].        Expected Outcomes Patient wishes to lose weight and learn more about CHF           Discharge Exercise Prescription (Final Exercise Prescription Changes):     Exercise Prescription Changes - 04/19/17 1400      Response to Exercise   Blood Pressure (Admit) 138/62   Blood Pressure (Exercise) 140/70   Blood Pressure (Exit) 138/68   Heart Rate (Admit) 52 bpm   Heart Rate (Exercise) 47 bpm   Heart Rate (Exit) 60 bpm   Rating of Perceived Exertion (Exercise) 11   Duration Progress to 30 minutes of  aerobic without signs/symptoms of physical distress   Intensity THRR New  [id number]     Progression   Progression Continue to progress workloads to maintain intensity without signs/symptoms of physical distress.     Resistance Training   Training Prescription Yes   Weight 1   Reps 10-15     NuStep   Level 2   SPM 89   Minutes 15   METs 2.4     Arm Ergometer   Level 2   Watts 4   RPM 20   Minutes 20   METs 20     Home Exercise Plan   Plans to continue exercise at Home (comment)   Frequency Add 2 additional days to program exercise sessions.   Initial Home Exercises Provided 04/07/17      Nutrition:  Target Goals: Understanding of nutrition guidelines, daily intake of sodium <1556m, cholesterol <2070m calories 30% from fat and 7% or less from saturated fats, daily  to have 5 or more servings of fruits and vegetables.  Biometrics:     Pre Biometrics - 03/29/17 1415      Pre Biometrics   Height 6' (1.829 m)   Weight (!)  312 lb 2.7 oz (141.6 kg)   Waist Circumference 54 inches   Hip Circumference 52 inches   Waist to Hip Ratio 1.04 %   BMI (Calculated) 42.33   Triceps Skinfold 17 mm   % Body Fat 39.6 %   Grip Strength 78.67 kg   Flexibility 0 in   Single Leg Stand 0 seconds       Nutrition Therapy Plan and Nutrition Goals:     Nutrition Therapy & Goals - 03/29/17 1457      Personal Nutrition Goals   Nutrition Goal To continue to eat heart healthy diet.    Additional Goals? No     Intervention Plan   Expected Outcomes Short Term Goal: A plan has been developed with personal nutrition goals set during dietitian appointment.;Long Term Goal: Adherence to prescribed nutrition plan.      Nutrition Discharge: Rate Your Plate Scores:     Nutrition Assessments - 03/29/17 1459      MEDFICTS Scores   Pre Score 62      Nutrition Goals Re-Evaluation:   Nutrition Goals Discharge (Final Nutrition Goals Re-Evaluation):   Psychosocial: Target Goals: Acknowledge presence or absence of significant depression and/or stress, maximize coping skills, provide positive support system. Participant is able to verbalize types and ability to use techniques and skills needed for  reducing stress and depression.  Initial Review & Psychosocial Screening:     Initial Psych Review & Screening - 03/29/17 1500      Initial Review   Current issues with None Identified     Family Dynamics   Good Support System? Yes     Barriers   Psychosocial barriers to participate in program There are no identifiable barriers or psychosocial needs.     Screening Interventions   Interventions Encouraged to exercise      Quality of Life Scores:     Quality of Life - 03/29/17 1416      Quality of Life Scores   Health/Function Pre 12.9 %   Socioeconomic  Pre 18.75 %   Psych/Spiritual Pre 18.64 %   Family Pre 15.6 %   GLOBAL Pre 15.59 %      PHQ-9: Recent Review Flowsheet Data    Depression screen [facility] 2/9 03/29/2017 08/19/2015 08/19/2015   Decreased Interest 0 0 0   Down, Depressed, Hopeless 0 0 0   PHQ - 2 Score 0 0 0   Altered sleeping 1 - -   Tired, decreased energy 2 - -   Change in appetite 0 - -   Feeling bad or failure about yourself  0 - -   Trouble concentrating 0 - -   Moving slowly or fidgety/restless 0 - -   Suicidal thoughts 0 - -   PHQ-9 Score 3 - -   Difficult doing work/chores Somewhat difficult - -     Interpretation of Total Score  Total Score Depression Severity:  1-4 = Minimal depression, 5-9 = Mild depression, 10-14 = Moderate depression, 15-19 = Moderately severe depression, 20-27 = Severe depression   Psychosocial Evaluation and Intervention:     Psychosocial Evaluation - 03/29/17 1501      Psychosocial Evaluation & Interventions   Interventions Encouraged to exercise with the program and follow exercise prescription   Continue Psychosocial Services  No Follow up required      Psychosocial Re-Evaluation:   Psychosocial Discharge (Final Psychosocial Re-Evaluation):   Vocational Rehabilitation: Provide vocational rehab assistance to qualifying candidates.   Vocational Rehab Evaluation & Intervention:     Vocational Rehab - 03/29/17 1447      Initial Vocational Rehab Evaluation & Intervention   Assessment shows need for Vocational Rehabilitation No      Education: Education Goals: Education classes will be provided on a weekly basis, covering required topics. Participant will state understanding/return demonstration of topics presented.  Learning Barriers/Preferences:     Learning Barriers/Preferences - 03/29/17 1444      Learning Barriers/Preferences   Learning Barriers None   Learning Preferences Pictoral;Written Material;Computer/Internet      Education Topics: Hypertension,  Hypertension Reduction -Define heart disease and high blood pressure. Discus how high blood pressure affects the body and ways to reduce high blood pressure.   Exercise and Your Heart -Discuss why it is important to exercise, the FITT principles of exercise, normal and abnormal responses to exercise, and how to exercise safely.   Angina -Discuss definition of angina, causes of angina, treatment of angina, and how to decrease risk of having angina.   Cardiac Medications -Review what the following cardiac medications are used for, how they affect the body, and side effects that may occur when taking the medications.  Medications include Aspirin, Beta blockers, calcium channel blockers, ACE Inhibitors, angiotensin receptor blockers, diuretics, digoxin, and antihyperlipidemics.   Congestive Heart Failure -Discuss the definition of CHF, how to  live with CHF, the signs and symptoms of CHF, and how keep track of weight and sodium intake.   Heart Disease and Intimacy -Discus the effect sexual activity has on the heart, how changes occur during intimacy as we age, and safety during sexual activity.   Smoking Cessation / COPD -Discuss different methods to quit smoking, the health benefits of quitting smoking, and the definition of COPD.   Nutrition I: Fats -Discuss the types of cholesterol, what cholesterol does to the heart, and how cholesterol levels can be controlled.   CARDIAC REHAB PHASE II EXERCISE from 04/21/2017 in [place]  Date  04/21/17  Educator  DC  Instruction Review Code  2- Demonstrated Understanding      Nutrition II: Labels -Discuss the different components of food labels and how to read food label   Heart Parts and Heart Disease -Discuss the anatomy of the heart, the pathway of blood circulation through the heart, and these are affected by heart disease.   Stress I: Signs and Symptoms -Discuss the causes of stress, how stress may lead to  anxiety and depression, and ways to limit stress.   Stress II: Relaxation -Discuss different types of relaxation techniques to limit stress.   Warning Signs of Stroke / TIA -Discuss definition of a stroke, what the signs and symptoms are of a stroke, and how to identify when someone is having stroke.   Knowledge Questionnaire Score:     Knowledge Questionnaire Score - 03/29/17 1445      Knowledge Questionnaire Score   Pre Score 19/24      Core Components/Risk Factors/Patient Goals at Admission:     Personal Goals and Risk Factors at Admission - 03/29/17 1459      Core Components/Risk Factors/Patient Goals on Admission    Weight Management Obesity   Improve shortness of breath with ADL's Yes   Intervention Provide education, individualized exercise plan and daily activity instruction to help decrease symptoms of SOB with activities of daily living.   Expected Outcomes Short Term: Achieves a reduction of symptoms when performing activities of daily living.   Personal Goal Other Yes   Personal Goal Keep living   Intervention Attend CR 3 x week and supplement exercise 2 x week at home   Expected Outcomes Achieve personal goals.       Core Components/Risk Factors/Patient Goals Review:    Core Components/Risk Factors/Patient Goals at Discharge (Final Review):    ITP Comments:     ITP Comments    Row Name 03/29/17 1456 04/07/17 0740 04/28/17 1433       ITP Comments Mr. Kozar is a 71 year old male with PVD, and extreme SOB with exertion. He has been to the doctor about his SOB.  Patient new to program. Plans to start Wednesday 04/07/17. Patient dropped out of program after attending 5 sessions. He says he plans to go to the [facility] and to water exercises. MD notified.         Comments: Patient stopped coming to Cardiac Rehab on 04/21/2017 after completing 5 sessions. Doctor will be informed.

## 2017-04-30 ENCOUNTER — Encounter (HOSPITAL_COMMUNITY): Payer: Medicare HMO

## 2017-05-03 ENCOUNTER — Encounter (HOSPITAL_COMMUNITY): Payer: Medicare HMO

## 2017-05-05 ENCOUNTER — Encounter (HOSPITAL_COMMUNITY): Payer: Medicare HMO

## 2017-05-06 ENCOUNTER — Ambulatory Visit: Payer: Medicare HMO | Admitting: Cardiology

## 2017-05-07 ENCOUNTER — Encounter (HOSPITAL_COMMUNITY): Payer: Medicare HMO

## 2017-05-10 ENCOUNTER — Encounter (HOSPITAL_COMMUNITY): Payer: Medicare HMO

## 2017-05-12 ENCOUNTER — Encounter (HOSPITAL_COMMUNITY): Payer: Medicare HMO

## 2017-05-14 ENCOUNTER — Encounter (HOSPITAL_COMMUNITY): Payer: Medicare HMO

## 2017-05-17 ENCOUNTER — Encounter (HOSPITAL_COMMUNITY): Payer: Medicare HMO

## 2017-05-18 ENCOUNTER — Encounter: Payer: Self-pay | Admitting: Cardiology

## 2017-05-18 NOTE — Progress Notes (Signed)
Cardiology Office Note  Date: 05/19/2017   ID: [name], [name] Mar 27, 1946, MRN [id number]  [facility]P: [name]  Primary Cardiologist: Rozann Lesches, MD   Chief Complaint  Patient presents with  . Coronary Artery Disease    History of Present Illness: [name] [name] is a 71 y.o. male that I am meeting for the first time in clinic today. He was most recently seen by Ms. West Pugh in June. I reviewed his records and updated the chart. He presents for a routine follow-up visit. Reports no angina symptoms on current medical regimen. No nitroglycerin use. He is limited by arthritic knee pain and uses a cane. He reports NYHA class II dyspnea.  He does not report any bleeding problems on aspirin and Plavix. He states that he has been compliant with his antihypertensive regimen. Lipid panel from June is outlined below.  Current cardiac regimen includes aspirin, Plavix, Norvasc, Lasix, Imdur, Cozaar, Zebeta, and Zocor.  Past Medical History:  Diagnosis Date  . CAD (coronary artery disease)    DES to LAD June 2018  . CKD (chronic kidney disease) stage 3, GFR 30-59 ml/min (HCC)   . Essential hypertension   . History of pneumonia   . Hyperlipidemia   . Hypothyroidism   . Morbid obesity ([place])   . Non Hodgkin's lymphoma ([place])    Status post XRT and chemotherapy  . NSTEMI (non-ST elevated myocardial infarction) [facility])    June 2018  . Peripheral neuropathy   . Sleep apnea   . Type 2 diabetes mellitus ([place])     Past Surgical History:  Procedure Laterality Date  . CHOLECYSTECTOMY  1992  . COLONOSCOPY N/A 09/03/2014   SLF:six colon polyps removed/small internal hemorrhoids  . CORONARY STENT INTERVENTION N/A 01/21/2017   Procedure: Coronary Stent Intervention;  Surgeon: Nelva Bush, MD;  Location: [place] [facility];  Service: Cardiovascular;  Laterality: N/A;  . ESOPHAGOGASTRODUODENOSCOPY N/A 09/03/2014   SLF: mild gastritis/few gastric polyps  . LEFT  HEART CATH AND CORONARY ANGIOGRAPHY N/A 01/20/2017   Procedure: Left Heart Cath and Coronary Angiography;  Surgeon: Jettie Booze, MD;  Location: [place] [facility];  Service: Cardiovascular;  Laterality: N/A;  . TOTAL KNEE ARTHROPLASTY  11/10/2011   Procedure: TOTAL KNEE ARTHROPLASTY;  Surgeon: Mauri Pole, MD;  Location: [facility];  Service: Orthopedics;  Laterality: Right;    Current Outpatient Prescriptions  Medication Sig Dispense Refill  . albuterol (PROVENTIL HFA;VENTOLIN HFA) 108 (90 Base) MCG/ACT inhaler Inhale 2 puffs into the lungs every 6 (six) hours as needed for wheezing or shortness of breath. 1 Inhaler 2  . amLODipine (NORVASC) 10 MG tablet Take 1 tablet (10 mg total) by mouth daily. 30 tablet 11  . aspirin 81 MG tablet Take 81 mg by mouth daily.    . bisoprolol (ZEBETA) 5 MG tablet Take 1 tablet (5 mg total) by mouth daily. 30 tablet 6  . budesonide-formoterol (SYMBICORT) 80-4.5 MCG/ACT inhaler Inhale 2 puffs into the lungs 2 (two) times daily.     . calcipotriene (DOVONOX) 0.005 % cream Apply 1 application topically 2 (two) times daily.     . clopidogrel (PLAVIX) 75 MG tablet Take 1 tablet (75 mg total) by mouth daily. 30 tablet 0  . fluticasone (FLONASE) 50 MCG/ACT nasal spray Place 1 spray into both nostrils daily.    . furosemide (LASIX) 40 MG tablet Take 1 tablet (40 mg total) by mouth daily. 30 tablet 11  . gabapentin (  NEURONTIN) 100 MG capsule Take 100 mg by mouth at bedtime.    . insulin aspart (NOVOLOG FLEXPEN) 100 UNIT/ML FlexPen Inject into the skin. Sliding scale As needed for blood sugar    . insulin degludec (TRESIBA FLEXTOUCH) 100 UNIT/ML SOPN FlexTouch Pen Inject 0.5 mLs (50 Units total) into the skin daily at 10 pm. (Patient taking differently: Inject 60 Units into the skin daily at 10 pm. )    . isosorbide mononitrate (IMDUR) 30 MG 24 hr tablet Take 1 tablet (30 mg total) by mouth daily. 30 tablet 1  . levothyroxine (SYNTHROID, LEVOTHROID) 100 MCG tablet  Take 100 mcg by mouth daily before breakfast.    . levothyroxine (SYNTHROID, LEVOTHROID) 200 MCG tablet Take 200 mcg by mouth daily before breakfast.    . losartan (COZAAR) 100 MG tablet Take 100 mg by mouth.    . montelukast (SINGULAIR) 10 MG tablet Take 10 mg by mouth.    . nitroGLYCERIN (NITROSTAT) 0.4 MG SL tablet Place 1 tablet (0.4 mg total) under the tongue every 5 (five) minutes as needed for chest pain. Up to 3 doses. If no relief after the 3rd dose, proceed to the ED for an evaluation 25 tablet 3  . omega-3 acid ethyl esters (LOVAZA) 1 g capsule Take 1 capsule by mouth daily.     . simvastatin (ZOCOR) 5 MG tablet Take 5 mg by mouth daily.    . Sodium Chloride-Sodium Bicarb (NETI POT SINUS WASH) 2300-700 MG KIT Place 1 Dose into the nose 2 (two) times daily. 1 each 0  . traZODone (DESYREL) 50 MG tablet TAKE 1 TO 2 TABLETS AT BEDTIME FOR INSOMNIA     No current facility-administered medications for this visit.    Allergies:  Patient has no known allergies.   Social History: The patient  reports that he quit smoking about 9 years ago. His smoking use included Cigarettes. He has a 30.00 pack-year smoking history. He has never used smokeless tobacco. He reports that he does not drink alcohol or use drugs.   ROS:  Please see the history of present illness. Otherwise, complete review of systems is positive for bilateral knee pain.  All other systems are reviewed and negative.   Physical Exam: VS:  BP 126/74   Pulse (!) 46   Ht 6' (1.829 m)   Wt (!) 316 lb 12.8 oz (143.7 kg)   SpO2 96%   BMI 42.97 kg/m , BMI Body mass index is 42.97 kg/m.  Wt Readings from Last 3 Encounters:  05/19/17 (!) 316 lb 12.8 oz (143.7 kg)  04/13/17 300 lb (136.1 kg)  04/02/17 (!) 305 lb 14.4 oz (138.8 kg)    General: Morbidly obese male, appears comfortable at rest. HEENT: Conjunctiva and lids normal, oropharynx clear. Neck: Supple, no elevated JVP or carotid bruits, no thyromegaly. Lungs: Diminished  breath sounds, nonlabored breathing at rest. Cardiac: Regular rate and rhythm, no S3 or significant systolic murmur, no pericardial rub. Abdomen: Soft, nontender, bowel sounds present, no guarding or rebound. Extremities: Mild lower leg edema, distal pulses 2+. Skin: Warm and dry. Musculoskeletal: No kyphosis. Neuropsychiatric: Alert and oriented x3, affect grossly appropriate.  ECG: I personally reviewed the tracing from 04/13/2017 which showed sinus rhythm with left bundle branch block.  Recent Labwork: 03/21/2017: TSH 0.670 04/01/2017: Magnesium 2.3 04/13/2017: ALT 20; AST 14; B Natriuretic Peptide 322.0; BUN 26; Creatinine, Ser 2.29; Hemoglobin 13.2; Platelets 143; Potassium 3.6; Sodium 140     Component Value Date/Time   CHOL  116 01/20/2017 1130   TRIG 174 (H) 01/20/2017 1130   HDL 31 (L) 01/20/2017 1130   CHOLHDL 3.7 01/20/2017 1130   VLDL 35 01/20/2017 1130   LDLCALC 50 01/20/2017 1130    Other Studies Reviewed Today:  Cardiac catheterization 01/20/2017:  1st Mrg lesion, 100 %stenosed. This is the culprit for his recent MI.  Ramus lesion, 75 %stenosed. This is a relatively small vessel.  Mid LAD lesion, 80 %stenosed.  LV end diastolic pressure is moderately elevated.  There is no aortic valve stenosis.   Completed MI from occluded obtuse marginal.  Patient has been pain free and feels only fatigued.  We elected not to attempt intervention given the time course of his enzymes.   Plan for PCI of the LAD tomorrow if renal function is stable.    Coronary PCI 01/21/2017: Conclusions: 1. Significant multivessel coronary artery disease (see yesterday's diagnostic catheterization for details). 2. Successful PCI to mid LAD with placement of a Synergy 3.0 x 32 mm drug-eluting stent with 0% residual stenosis and TIMI-3 flow.  Recommendations: 1. Dual antiplatelet therapy with aspirin and clopidogrel for at least 12 months. 2. Aggressive secondary prevention. 3. If chest pain  recurs despite optimal medical therapy, PCI to ramus and/or distal LAD could be considered.  Echocardiogram 01/20/2017: Study Conclusions  - Left ventricle: Wall thickness was increased in a pattern of   moderate LVH. Systolic function was mildly reduced. The estimated   ejection fraction was in the range of 45% to 50%. Features are   consistent with a pseudonormal left ventricular filling pattern,   with concomitant abnormal relaxation and increased filling   pressure (grade 2 diastolic dysfunction). - Aortic valve: There was mild regurgitation.  Assessment and Plan:  1. Symptomatically stable CAD status post DES to the LAD in June 2018 with known occluded obtuse marginal. He also has moderate ramus intermedius disease is being managed medically. Plan to continue current regimen and observation.  2. Ischemic cardiomyopathy with LVEF 45-50% by recent echocardiogram. Continues on Zebeta, Cozaar, and Lasix.  3. Essential hypertension, blood pressure is well controlled and a.  4. Mixed hyperlipidemia, on Zocor. Last LDL 50.  Current medicines were reviewed with the patient today.   Orders Placed This Encounter  Procedures  . ECHOCARDIOGRAM COMPLETE    Disposition: Follow-up in 6 months, sooner if needed.  Signed, Satira Sark, MD, [facility] 05/19/2017 2:14 PM    [facility] at [facility] [address] [place],  [address] Phone: [phone]; Fax: [phone]

## 2017-05-19 ENCOUNTER — Encounter: Payer: Self-pay | Admitting: Cardiology

## 2017-05-19 ENCOUNTER — Ambulatory Visit (INDEPENDENT_AMBULATORY_CARE_PROVIDER_SITE_OTHER): Payer: Medicare HMO | Admitting: Cardiology

## 2017-05-19 ENCOUNTER — Encounter (HOSPITAL_COMMUNITY): Payer: Medicare HMO

## 2017-05-19 VITALS — BP 126/74 | HR 46 | Ht 72.0 in | Wt 316.8 lb

## 2017-05-19 DIAGNOSIS — E782 Mixed hyperlipidemia: Secondary | ICD-10-CM

## 2017-05-19 DIAGNOSIS — I10 Essential (primary) hypertension: Secondary | ICD-10-CM

## 2017-05-19 DIAGNOSIS — I255 Ischemic cardiomyopathy: Secondary | ICD-10-CM | POA: Diagnosis not present

## 2017-05-19 DIAGNOSIS — I25119 Atherosclerotic heart disease of native coronary artery with unspecified angina pectoris: Secondary | ICD-10-CM | POA: Diagnosis not present

## 2017-05-19 NOTE — Patient Instructions (Signed)
Medication Instructions:   Your physician recommends that you continue on your current medications as directed. Please refer to the Current Medication list given to you today.  Labwork:  NONE  Testing/Procedures: Your physician has requested that you have an echocardiogram in 6 months. Echocardiography is a painless test that uses sound waves to create images of your heart. It provides your doctor with information about the size and shape of your heart and how well your heart's chambers and valves are working. This procedure takes approximately one hour. There are no restrictions for this procedure.  Follow-Up:  Your physician recommends that you schedule a follow-up appointment in: 6 months. You will receive a reminder letter in the mail in about 4 months reminding you to call and schedule your appointment. If you don't receive this letter, please contact our office.  Any Other Special Instructions Will Be Listed Below (If Applicable).  If you need a refill on your cardiac medications before your next appointment, please call your pharmacy.

## 2017-05-21 ENCOUNTER — Encounter (HOSPITAL_COMMUNITY): Payer: Medicare HMO

## 2017-05-24 ENCOUNTER — Encounter (HOSPITAL_COMMUNITY): Payer: Medicare HMO

## 2017-05-26 ENCOUNTER — Encounter (HOSPITAL_COMMUNITY): Payer: Medicare HMO

## 2017-05-28 ENCOUNTER — Encounter (HOSPITAL_COMMUNITY): Payer: Medicare HMO

## 2017-05-31 ENCOUNTER — Encounter (HOSPITAL_COMMUNITY): Payer: Medicare HMO

## 2017-06-01 ENCOUNTER — Ambulatory Visit (HOSPITAL_COMMUNITY)
Admission: RE | Admit: 2017-06-01 | Discharge: 2017-06-01 | Disposition: A | Discharge status: Home / Self Care | Payer: Medicare HMO | Source: Ambulatory Visit | Attending: Cardiology | Admitting: Cardiology

## 2017-06-01 DIAGNOSIS — I5030 Unspecified diastolic (congestive) heart failure: Secondary | ICD-10-CM | POA: Insufficient documentation

## 2017-06-01 DIAGNOSIS — I255 Ischemic cardiomyopathy: Secondary | ICD-10-CM | POA: Diagnosis not present

## 2017-06-01 DIAGNOSIS — I25119 Atherosclerotic heart disease of native coronary artery with unspecified angina pectoris: Secondary | ICD-10-CM | POA: Diagnosis not present

## 2017-06-01 DIAGNOSIS — I420 Dilated cardiomyopathy: Secondary | ICD-10-CM | POA: Diagnosis not present

## 2017-06-01 LAB — ECHOCARDIOGRAM COMPLETE
AO mean calculated velocity dopler: 127 cm/s
AOVTI: 45.1 cm
AV Area VTI index: 0.63 cm2/m2
AV Mean grad: 7 mmHg
AV Peak grad: 13 mmHg
AV peak Index: 0.64
AV pk vel: 179 cm/s
AVAREAMEANV: 1.79 cm2
AVAREAMEANVIN: 0.65 cm2/m2
AVAREAVTI: 1.77 cm2
AVCELMEANRAT: 0.71
Ao pk vel: 0.7 m/s
CHL CUP AV VEL: 1.73
CHL CUP MV DEC (S): 232
CHL CUP STROKE VOLUME: 79 mL
E decel time: 232 msec
E/e' ratio: 13.13
FS: 33 % (ref 28–44)
IV/PV OW: 1.32
LA vol A4C: 127 ml
LA vol index: 40.8 mL/m2
LADIAMINDEX: 1.55 cm/m2
LASIZE: 43 mm
LAVOL: 113 mL
LDCA: 2.54 cm2
LEFT ATRIUM END SYS DIAM: 43 mm
LV E/e' medial: 13.13
LV E/e'average: 13.13
LV PW d: 11.7 mm — AB (ref 0.6–1.1)
LV SIMPSON'S DISK: 56
LV dias vol index: 51 mL/m2
LV dias vol: 140 mL (ref 62–150)
LV e' LATERAL: 9.9 cm/s
LVOT SV: 78 mL
LVOT VTI: 30.7 cm
LVOT diameter: 18 mm
LVOT peak VTI: 0.68 cm
LVOT peak grad rest: 6 mmHg
LVOTPV: 125 cm/s
LVSYSVOL: 62 mL — AB
LVSYSVOLIN: 22 mL/m2
Lateral S' vel: 14.4 cm/s
MV Peak grad: 7 mmHg
MVPKAVEL: 52.9 m/s
MVPKEVEL: 130 m/s
RV TAPSE: 20.4 mm
TDI e' lateral: 9.9
TDI e' medial: 5
Valve area index: 0.63
Valve area: 1.73 cm2

## 2017-06-01 NOTE — Progress Notes (Signed)
*  PRELIMINARY RESULTS* Echocardiogram 2D Echocardiogram has been performed.  [name] [name] 06/01/2017, 10:24 AM

## 2017-06-02 ENCOUNTER — Encounter (HOSPITAL_COMMUNITY): Payer: Medicare HMO

## 2017-06-04 ENCOUNTER — Encounter (HOSPITAL_COMMUNITY): Payer: Medicare HMO

## 2017-06-07 ENCOUNTER — Encounter (HOSPITAL_COMMUNITY): Payer: Medicare HMO

## 2017-06-09 ENCOUNTER — Encounter (HOSPITAL_COMMUNITY): Payer: Medicare HMO

## 2017-06-11 ENCOUNTER — Encounter (HOSPITAL_COMMUNITY): Payer: Medicare HMO

## 2017-06-14 ENCOUNTER — Encounter (HOSPITAL_COMMUNITY): Payer: Medicare HMO

## 2017-06-16 ENCOUNTER — Encounter (HOSPITAL_COMMUNITY): Payer: Medicare HMO

## 2017-06-18 ENCOUNTER — Encounter (HOSPITAL_COMMUNITY): Payer: Medicare HMO

## 2017-06-21 ENCOUNTER — Encounter (HOSPITAL_COMMUNITY): Payer: Medicare HMO

## 2017-06-23 ENCOUNTER — Encounter (HOSPITAL_COMMUNITY): Payer: Medicare HMO

## 2017-06-25 ENCOUNTER — Encounter (HOSPITAL_COMMUNITY): Payer: Medicare HMO

## 2017-06-28 ENCOUNTER — Encounter (HOSPITAL_COMMUNITY): Payer: Medicare HMO

## 2017-08-05 ENCOUNTER — Encounter: Payer: Self-pay | Admitting: Gastroenterology

## 2017-08-10 ENCOUNTER — Ambulatory Visit (HOSPITAL_COMMUNITY): Payer: Medicare HMO

## 2017-08-12 ENCOUNTER — Other Ambulatory Visit (HOSPITAL_COMMUNITY): Payer: Medicare HMO

## 2017-08-12 ENCOUNTER — Ambulatory Visit (HOSPITAL_COMMUNITY): Payer: Medicare HMO

## 2017-08-16 ENCOUNTER — Inpatient Hospital Stay (HOSPITAL_COMMUNITY): Payer: Medicare HMO | Attending: Oncology

## 2017-08-16 ENCOUNTER — Ambulatory Visit (HOSPITAL_COMMUNITY): Payer: Medicare HMO | Admitting: Hematology and Oncology

## 2017-08-16 DIAGNOSIS — C8580 Other specified types of non-Hodgkin lymphoma, unspecified site: Secondary | ICD-10-CM | POA: Insufficient documentation

## 2017-08-16 LAB — CBC WITH DIFFERENTIAL/PLATELET
BASOS ABS: 0 10*3/uL (ref 0.0–0.1)
BASOS PCT: 0 %
EOS ABS: 0.3 10*3/uL (ref 0.0–0.7)
Eosinophils Relative: 4 %
HCT: 42 % (ref 39.0–52.0)
HEMOGLOBIN: 13.6 g/dL (ref 13.0–17.0)
LYMPHS ABS: 1.2 10*3/uL (ref 0.7–4.0)
Lymphocytes Relative: 18 %
MCH: 27.3 pg (ref 26.0–34.0)
MCHC: 32.4 g/dL (ref 30.0–36.0)
MCV: 84.3 fL (ref 78.0–100.0)
Monocytes Absolute: 0.7 10*3/uL (ref 0.1–1.0)
Monocytes Relative: 10 %
NEUTROS PCT: 68 %
Neutro Abs: 4.5 10*3/uL (ref 1.7–7.7)
PLATELETS: 121 10*3/uL — AB (ref 150–400)
RBC: 4.98 MIL/uL (ref 4.22–5.81)
RDW: 15.7 % — ABNORMAL HIGH (ref 11.5–15.5)
WBC: 6.6 10*3/uL (ref 4.0–10.5)

## 2017-08-16 LAB — COMPREHENSIVE METABOLIC PANEL
ALBUMIN: 3.5 g/dL (ref 3.5–5.0)
ALK PHOS: 73 U/L (ref 38–126)
ALT: 10 U/L — AB (ref 17–63)
AST: 15 U/L (ref 15–41)
Anion gap: 10 (ref 5–15)
BUN: 30 mg/dL — AB (ref 6–20)
CALCIUM: 9 mg/dL (ref 8.9–10.3)
CHLORIDE: 104 mmol/L (ref 101–111)
CO2: 26 mmol/L (ref 22–32)
CREATININE: 2.46 mg/dL — AB (ref 0.61–1.24)
GFR calc Af Amer: 29 mL/min — ABNORMAL LOW (ref >60)
GFR calc non Af Amer: 25 mL/min — ABNORMAL LOW (ref >60)
GLUCOSE: 112 mg/dL — AB (ref 65–99)
Potassium: 3.9 mmol/L (ref 3.5–5.1)
SODIUM: 140 mmol/L (ref 135–145)
Total Bilirubin: 1.3 mg/dL — ABNORMAL HIGH (ref 0.3–1.2)
Total Protein: 7.2 g/dL (ref 6.5–8.1)

## 2017-08-16 LAB — LACTATE DEHYDROGENASE: LDH: 160 U/L (ref 98–192)

## 2017-08-19 ENCOUNTER — Encounter (HOSPITAL_COMMUNITY): Payer: Medicare HMO

## 2017-08-24 ENCOUNTER — Ambulatory Visit (HOSPITAL_COMMUNITY): Payer: Medicare HMO | Admitting: Oncology

## 2017-09-01 ENCOUNTER — Ambulatory Visit (HOSPITAL_COMMUNITY)
Admission: RE | Admit: 2017-09-01 | Discharge: 2017-09-01 | Disposition: A | Discharge status: Home / Self Care | Payer: Medicare HMO | Source: Ambulatory Visit | Attending: Oncology | Admitting: Oncology

## 2017-09-01 DIAGNOSIS — R590 Localized enlarged lymph nodes: Secondary | ICD-10-CM | POA: Diagnosis not present

## 2017-09-01 DIAGNOSIS — J90 Pleural effusion, not elsewhere classified: Secondary | ICD-10-CM | POA: Insufficient documentation

## 2017-09-01 DIAGNOSIS — C8580 Other specified types of non-Hodgkin lymphoma, unspecified site: Secondary | ICD-10-CM | POA: Diagnosis present

## 2017-09-01 DIAGNOSIS — R161 Splenomegaly, not elsewhere classified: Secondary | ICD-10-CM | POA: Insufficient documentation

## 2017-09-01 LAB — GLUCOSE, CAPILLARY: GLUCOSE-CAPILLARY: 123 mg/dL — AB (ref 65–99)

## 2017-09-01 MED ORDER — FLUDEOXYGLUCOSE F - 18 (FDG) INJECTION
16.1000 | Freq: Once | INTRAVENOUS | Status: AC | PRN
  Administered 2017-09-01: 16.1 via INTRAVENOUS

## 2017-09-02 ENCOUNTER — Other Ambulatory Visit (HOSPITAL_COMMUNITY): Payer: Self-pay | Admitting: *Deleted

## 2017-09-02 DIAGNOSIS — K6389 Other specified diseases of intestine: Secondary | ICD-10-CM

## 2017-09-03 ENCOUNTER — Inpatient Hospital Stay (HOSPITAL_COMMUNITY): Payer: Medicare HMO

## 2017-09-03 ENCOUNTER — Encounter (HOSPITAL_COMMUNITY): Payer: Self-pay | Admitting: Internal Medicine

## 2017-09-03 ENCOUNTER — Inpatient Hospital Stay (HOSPITAL_COMMUNITY): Payer: Medicare HMO | Attending: Internal Medicine | Admitting: Internal Medicine

## 2017-09-03 ENCOUNTER — Other Ambulatory Visit: Payer: Self-pay

## 2017-09-03 VITALS — BP 135/55 | HR 46 | Temp 98.4°F | Resp 20 | Ht 72.0 in | Wt 314.0 lb

## 2017-09-03 DIAGNOSIS — I2510 Atherosclerotic heart disease of native coronary artery without angina pectoris: Secondary | ICD-10-CM | POA: Diagnosis not present

## 2017-09-03 DIAGNOSIS — N183 Chronic kidney disease, stage 3 unspecified: Secondary | ICD-10-CM

## 2017-09-03 DIAGNOSIS — K6389 Other specified diseases of intestine: Secondary | ICD-10-CM

## 2017-09-03 DIAGNOSIS — M1712 Unilateral primary osteoarthritis, left knee: Secondary | ICD-10-CM | POA: Diagnosis not present

## 2017-09-03 DIAGNOSIS — Z8572 Personal history of non-Hodgkin lymphomas: Secondary | ICD-10-CM | POA: Insufficient documentation

## 2017-09-03 DIAGNOSIS — I252 Old myocardial infarction: Secondary | ICD-10-CM | POA: Insufficient documentation

## 2017-09-03 DIAGNOSIS — C8580 Other specified types of non-Hodgkin lymphoma, unspecified site: Secondary | ICD-10-CM

## 2017-09-03 LAB — CBC WITH DIFFERENTIAL/PLATELET
Basophils Absolute: 0 10*3/uL (ref 0.0–0.1)
Basophils Relative: 1 %
Eosinophils Absolute: 0.3 10*3/uL (ref 0.0–0.7)
Eosinophils Relative: 5 %
HEMATOCRIT: 41.6 % (ref 39.0–52.0)
HEMOGLOBIN: 13.4 g/dL (ref 13.0–17.0)
LYMPHS ABS: 1.2 10*3/uL (ref 0.7–4.0)
Lymphocytes Relative: 19 %
MCH: 27.2 pg (ref 26.0–34.0)
MCHC: 32.2 g/dL (ref 30.0–36.0)
MCV: 84.6 fL (ref 78.0–100.0)
MONO ABS: 0.5 10*3/uL (ref 0.1–1.0)
Monocytes Relative: 9 %
NEUTROS ABS: 4.1 10*3/uL (ref 1.7–7.7)
NEUTROS PCT: 66 %
Platelets: 143 10*3/uL — ABNORMAL LOW (ref 150–400)
RBC: 4.92 MIL/uL (ref 4.22–5.81)
RDW: 16 % — AB (ref 11.5–15.5)
WBC: 6.1 10*3/uL (ref 4.0–10.5)

## 2017-09-03 LAB — COMPREHENSIVE METABOLIC PANEL
ALK PHOS: 82 U/L (ref 38–126)
ALT: 12 U/L — ABNORMAL LOW (ref 17–63)
ANION GAP: 10 (ref 5–15)
AST: 16 U/L (ref 15–41)
Albumin: 3.4 g/dL — ABNORMAL LOW (ref 3.5–5.0)
BILIRUBIN TOTAL: 1.1 mg/dL (ref 0.3–1.2)
BUN: 27 mg/dL — ABNORMAL HIGH (ref 6–20)
CALCIUM: 8.6 mg/dL — AB (ref 8.9–10.3)
CO2: 22 mmol/L (ref 22–32)
Chloride: 104 mmol/L (ref 101–111)
Creatinine, Ser: 2.19 mg/dL — ABNORMAL HIGH (ref 0.61–1.24)
GFR, EST AFRICAN AMERICAN: 33 mL/min — AB (ref >60)
GFR, EST NON AFRICAN AMERICAN: 29 mL/min — AB (ref >60)
GLUCOSE: 183 mg/dL — AB (ref 65–99)
Potassium: 3.6 mmol/L (ref 3.5–5.1)
Sodium: 136 mmol/L (ref 135–145)
TOTAL PROTEIN: 7 g/dL (ref 6.5–8.1)

## 2017-09-03 LAB — LACTATE DEHYDROGENASE: LDH: 141 U/L (ref 98–192)

## 2017-09-03 NOTE — Progress Notes (Signed)
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   HISTORY OF PRESENT ILLNESS:Denies any night sweats fevers no abdominal pain.  He has left knee arthritis pending knee replacement due to pain from arthritis he is limited with his mobility he uses wheelchair. Otherwise he is doing well no change in bowel habits no melena hematochezia no nausea vomiting abdominal pain. His weight I stable, appetite is fair.   INTERVAL HISTORY:He has a history of coronary artery disease status post MI recently has a stent he is on Plavix now. No new symptoms since last visit, all other system reView Is negative.   PET scan from September 01, 2017 has been reviewed. It shows no hypermetabolic activity.A mild increase in the subcarinal lymph node without any significant hypermetabolic activity.  Resolved perivesical mass and improved bladder wall thickening without any residual hypermetabolic activity.  Stable splenomegaly without any FDG uptake.  Small left pleural effusion was noted.  LDH is 141 today CMP shows increased glucose of 183 creatinine is high at 2.19 which is lower than 2.46 seen on 08/16/2017, otherwise unremarkable. Cbc shows normal hgb, normal wbc count, platelets are low but stable at 143  Physical Exam  Constitutional: He is oriented to person, place, and time. He appears well-developed and well-nourished.  HENT:  Head: Normocephalic and atraumatic.  Mouth/Throat: Oropharynx is clear and moist. No oropharyngeal exudate.  Eyes: Conjunctivae and EOM are normal. Pupils are equal, round, and reactive to light.  Neck: Normal range of motion. Neck supple.  Cardiovascular: Normal rate and regular rhythm.  Pulmonary/Chest: Effort normal and breath sounds normal. No respiratory distress.  Abdominal: Soft. Bowel sounds are normal. He exhibits no  distension and no mass.  Musculoskeletal: Normal range of motion. He exhibits no edema or deformity.  Lymphadenopathy:    He has no cervical adenopathy.  Neurological: He is alert and oriented to person, place, and time. No cranial nerve deficit.  Skin: Skin is warm and dry. No rash noted. No pallor.  Psychiatric: He has a normal mood and affect. His behavior is normal. Judgment and thought content normal.    ASSESSMENT AND PLAN:  Marginal zone lymphoma his PET scan has been reviewed no hypermetabolic activity to suspect recurrence.   Continue follow-up with return visit in 6 months,  repeat CBC CMP and LDH at that point.  I recommended a repeat CAT scan and see in 6 months but if he develops any new symptoms such as flank pain dullness or decrease in urinary output or increase in serum creatinine we may have to repeat the CT scan sooner.  He understands and he knows to contact us for any new symptoms.

## 2017-11-25 ENCOUNTER — Other Ambulatory Visit: Payer: Self-pay

## 2017-11-25 DIAGNOSIS — I255 Ischemic cardiomyopathy: Secondary | ICD-10-CM

## 2017-11-25 NOTE — Progress Notes (Signed)
Echo orders placed.

## 2017-12-28 ENCOUNTER — Ambulatory Visit (HOSPITAL_COMMUNITY)
Admission: RE | Admit: 2017-12-28 | Discharge: 2017-12-28 | Disposition: A | Discharge status: Home / Self Care | Payer: Medicare HMO | Source: Ambulatory Visit | Attending: Cardiology | Admitting: Cardiology

## 2017-12-28 ENCOUNTER — Encounter: Payer: Self-pay | Admitting: Cardiology

## 2017-12-28 ENCOUNTER — Ambulatory Visit (INDEPENDENT_AMBULATORY_CARE_PROVIDER_SITE_OTHER): Payer: Medicare HMO | Admitting: Cardiology

## 2017-12-28 ENCOUNTER — Telehealth: Payer: Self-pay

## 2017-12-28 VITALS — BP 118/72 | HR 50 | Ht 72.0 in | Wt 317.0 lb

## 2017-12-28 DIAGNOSIS — I25119 Atherosclerotic heart disease of native coronary artery with unspecified angina pectoris: Secondary | ICD-10-CM

## 2017-12-28 DIAGNOSIS — I10 Essential (primary) hypertension: Secondary | ICD-10-CM | POA: Insufficient documentation

## 2017-12-28 DIAGNOSIS — I252 Old myocardial infarction: Secondary | ICD-10-CM | POA: Diagnosis not present

## 2017-12-28 DIAGNOSIS — I2510 Atherosclerotic heart disease of native coronary artery without angina pectoris: Secondary | ICD-10-CM | POA: Insufficient documentation

## 2017-12-28 DIAGNOSIS — Z01810 Encounter for preprocedural cardiovascular examination: Secondary | ICD-10-CM

## 2017-12-28 DIAGNOSIS — I255 Ischemic cardiomyopathy: Secondary | ICD-10-CM | POA: Diagnosis not present

## 2017-12-28 DIAGNOSIS — Z87891 Personal history of nicotine dependence: Secondary | ICD-10-CM | POA: Insufficient documentation

## 2017-12-28 DIAGNOSIS — E782 Mixed hyperlipidemia: Secondary | ICD-10-CM

## 2017-12-28 MED ORDER — PERFLUTREN LIPID MICROSPHERE
1.0000 mL | INTRAVENOUS | Status: AC | PRN
  Administered 2017-12-28: 2 mL via INTRAVENOUS

## 2017-12-28 MED ORDER — PERFLUTREN LIPID MICROSPHERE: 1.0000 mL | INTRAVENOUS | Status: AC | PRN

## 2017-12-28 NOTE — Patient Instructions (Addendum)
Your physician wants you to follow-up in:6 months with Dr.McDowell You will receive a reminder letter in the mail two months in advance. If you don't receive a letter, please call our office to schedule the follow-up appointment.   Your physician recommends that you continue on your current medications as directed. Please refer to the Current Medication list given to you today.   If you need a refill on your cardiac medications before your next appointment, please call your pharmacy.    No lab work or tests ordered today.      Thank you for choosing [facility] !         

## 2017-12-28 NOTE — Progress Notes (Signed)
Cardiology Office Note  Date: 12/28/2017   ID: Atif, Chapple [birth year], MRN [id number]  [name]  Primary Cardiologist: Rozann Lesches, MD   Chief Complaint  Patient presents with  . Coronary Artery Disease    History of Present Illness: [name] [name] is a 72 y.o. male that I met in October 2018.  He presents for a routine follow-up visit.  Denies any angina symptoms or nitroglycerin use on medical therapy.  He does state that he is planning to undergo follow-up orthopedic consultation regarding left knee replacement due to progressive pain on ambulation.  He underwent a right knee replacement approximately 8 years ago.  Current cardiac regimen includes aspirin, Norvasc, bisoprolol, Plavix, Imdur, Cozaar, and Zocor.  He had a follow-up echocardiogram done earlier today, images still pending for review.  Previous LVEF was in the range of 45 to 50% last year.  Past Medical History:  Diagnosis Date  . CAD (coronary artery disease)    DES to LAD June 2018  . CKD (chronic kidney disease) stage 3, GFR 30-59 ml/min (HCC)   . Essential hypertension   . History of pneumonia   . Hyperlipidemia   . Hypothyroidism   . Morbid obesity ([place])   . Non Hodgkin's lymphoma ([place])    Status post XRT and chemotherapy  . NSTEMI (non-ST elevated myocardial infarction) [facility])    June 2018  . Peripheral neuropathy   . Sleep apnea   . Type 2 diabetes mellitus ([place])     Past Surgical History:  Procedure Laterality Date  . CHOLECYSTECTOMY  1992  . COLONOSCOPY N/A 09/03/2014   SLF:six colon polyps removed/small internal hemorrhoids  . CORONARY STENT INTERVENTION N/A 01/21/2017   Procedure: Coronary Stent Intervention;  Surgeon: Nelva Bush, MD;  Location: [place] [facility];  Service: Cardiovascular;  Laterality: N/A;  . ESOPHAGOGASTRODUODENOSCOPY N/A 09/03/2014   SLF: mild gastritis/few gastric polyps  . LEFT HEART CATH AND CORONARY ANGIOGRAPHY N/A  01/20/2017   Procedure: Left Heart Cath and Coronary Angiography;  Surgeon: Jettie Booze, MD;  Location: [place] [facility];  Service: Cardiovascular;  Laterality: N/A;  . TOTAL KNEE ARTHROPLASTY  11/10/2011   Procedure: TOTAL KNEE ARTHROPLASTY;  Surgeon: Mauri Pole, MD;  [facility];  Service: Orthopedics;  Laterality: Right;    Current Outpatient Medications  Medication Sig Dispense Refill  . albuterol (PROVENTIL HFA;VENTOLIN HFA) 108 (90 Base) MCG/ACT inhaler Inhale 2 puffs into the lungs every 6 (six) hours as needed for wheezing or shortness of breath. 1 Inhaler 2  . amLODipine (NORVASC) 10 MG tablet Take 1 tablet (10 mg total) by mouth daily. 30 tablet 11  . aspirin 81 MG tablet Take 81 mg by mouth daily.    . bisoprolol (ZEBETA) 5 MG tablet Take 1 tablet (5 mg total) by mouth daily. 30 tablet 6  . budesonide-formoterol (SYMBICORT) 80-4.5 MCG/ACT inhaler Inhale 2 puffs into the lungs 2 (two) times daily.     . calcipotriene (DOVONOX) 0.005 % cream Apply 1 application topically 2 (two) times daily.     . clopidogrel (PLAVIX) 75 MG tablet Take 1 tablet (75 mg total) by mouth daily. 30 tablet 0  . fluticasone (FLONASE) 50 MCG/ACT nasal spray Place 1 spray into both nostrils daily.    . furosemide (LASIX) 40 MG tablet Take 1 tablet (40 mg total) by mouth daily. 30 tablet 11  . gabapentin (NEURONTIN) 100 MG capsule Take 100 mg by mouth at  bedtime.    . insulin aspart (NOVOLOG FLEXPEN) 100 UNIT/ML FlexPen Inject into the skin. Sliding scale As needed for blood sugar    . insulin degludec (TRESIBA FLEXTOUCH) 100 UNIT/ML SOPN FlexTouch Pen Inject 0.5 mLs (50 Units total) into the skin daily at 10 pm. (Patient taking differently: Inject 60 Units into the skin daily at 10 pm. )    . ipratropium (ATROVENT) 0.03 % nasal spray     . isosorbide mononitrate (IMDUR) 30 MG 24 hr tablet Take 1 tablet (30 mg total) by mouth daily. 30 tablet 1  . levocetirizine (XYZAL) 5 MG tablet     .  levothyroxine (SYNTHROID, LEVOTHROID) 100 MCG tablet Take 100 mcg by mouth daily before breakfast.    . levothyroxine (SYNTHROID, LEVOTHROID) 200 MCG tablet Take 200 mcg by mouth daily before breakfast.    . losartan (COZAAR) 100 MG tablet Take 100 mg by mouth.    . montelukast (SINGULAIR) 10 MG tablet Take 10 mg by mouth.    . omega-3 acid ethyl esters (LOVAZA) 1 g capsule Take 1 capsule by mouth daily.     . simvastatin (ZOCOR) 5 MG tablet Take 5 mg by mouth daily.    . Sodium Chloride-Sodium Bicarb (NETI POT SINUS WASH) 2300-700 MG KIT Place 1 Dose into the nose 2 (two) times daily. 1 each 0  . traZODone (DESYREL) 50 MG tablet TAKE 1 TO 2 TABLETS AT BEDTIME FOR INSOMNIA    . nitroGLYCERIN (NITROSTAT) 0.4 MG SL tablet Place 1 tablet (0.4 mg total) under the tongue every 5 (five) minutes as needed for chest pain. Up to 3 doses. If no relief after the 3rd dose, proceed to the ED for an evaluation 25 tablet 3   No current facility-administered medications for this visit.    Facility-Administered Medications Ordered in Other Visits  Medication Dose Route Frequency Provider Last Rate Last Dose  . perflutren lipid microspheres (DEFINITY) IV suspension  1-10 mL Intravenous PRN Satira Sark, MD       Allergies:  Patient has no known allergies.   Social History: The patient  reports that he quit smoking about 10 years ago. His smoking use included cigarettes. He has a 30.00 pack-year smoking history. He has never used smokeless tobacco. He reports that he does not drink alcohol or use drugs.   ROS:  Please see the history of present illness. Otherwise, complete review of systems is positive for progressive arthritic knee pain.  All other systems are reviewed and negative.   Physical Exam: VS:  BP 118/72   Pulse (!) 50   Ht 6' (1.829 m)   Wt (!) 317 lb (143.8 kg)   SpO2 94%   BMI 42.99 kg/m , BMI Body mass index is 42.99 kg/m.  Wt Readings from Last 3 Encounters:  12/28/17 (!) 317 lb  (143.8 kg)  09/03/17 (!) 314 lb (142.4 kg)  05/19/17 (!) 316 lb 12.8 oz (143.7 kg)    General: Morbidly obese male, appears comfortable at rest. HEENT: Conjunctiva and lids normal, oropharynx clear. Neck: Supple, no elevated JVP or carotid bruits, no thyromegaly. Lungs: Clear to auscultation, nonlabored breathing at rest. Cardiac: Regular rate and rhythm, no S3 or significant systolic murmur, no pericardial rub. Abdomen: Soft, nontender, bowel sounds present. Extremities: Mild lower leg and ankle edema, distal pulses 2+. Skin: Warm and dry. Musculoskeletal: No kyphosis. Neuropsychiatric: Alert and oriented x3, affect grossly appropriate.  ECG: I personally reviewed the tracing from 04/13/2017 which showed sinus rhythm  with left bundle branch block.  Recent Labwork: 03/21/2017: TSH 0.670 04/01/2017: Magnesium 2.3 04/13/2017: B Natriuretic Peptide 322.0 09/03/2017: ALT 12; AST 16; BUN 27; Creatinine, Ser 2.19; Hemoglobin 13.4; Platelets 143; Potassium 3.6; Sodium 136     Component Value Date/Time   CHOL 116 01/20/2017 1130   TRIG 174 (H) 01/20/2017 1130   HDL 31 (L) 01/20/2017 1130   CHOLHDL 3.7 01/20/2017 1130   VLDL 35 01/20/2017 1130   LDLCALC 50 01/20/2017 1130    Other Studies Reviewed Today:  Cardiac catheterization 01/20/2017:  1st Mrg lesion, 100 %stenosed. This is the culprit for his recent MI.  Ramus lesion, 75 %stenosed. This is a relatively small vessel.  Mid LAD lesion, 80 %stenosed.  LV end diastolic pressure is moderately elevated.  There is no aortic valve stenosis.  Completed MI from occluded obtuse marginal. Patient has been pain free and feels only fatigued. We elected not to attempt intervention given the time course of his enzymes.  Plan for PCI of the LAD tomorrow if renal function is stable.   Coronary PCI 01/21/2017: Conclusions: 1. Significant multivessel coronary artery disease (see yesterday's diagnostic catheterization for  details). 2. Successful PCI to mid LAD with placement of a Synergy 3.0 x 32 mm drug-eluting stent with 0% residual stenosis and TIMI-3 flow.  Recommendations: 1. Dual antiplatelet therapy with aspirin and clopidogrel for at least 12 months. 2. Aggressive secondary prevention. 3. If chest pain recurs despite optimal medical therapy, PCI to ramus and/or distal LAD could be considered.  Assessment and Plan:  1.  CAD status post DES to the LAD in June 2018 with known occlusion of the obtuse marginal and moderately stenosed ramus intermedius that is being managed medically.  He is symptomatically stable and we will continue with medical therapy and observation.  2.  Ischemic cardiomyopathy, previous LVEF 45 to 50% with follow-up study pending.  3.  Preoperative cardiac evaluation.  He is planning to undergo further orthopedic consultation regarding left knee replacement.  He is at a point where dual antiplatelet therapy could be held.  He has also been clinically stable and is unlikely to require any further cardiac testing prior to surgery.  4.  Mixed hyperlipidemia, continues on Zocor.  Current medicines were reviewed with the patient today.  Disposition: Follow-up in 6 months.  Signed, Satira Sark, MD, [facility] 12/28/2017 2:44 PM    [facility] [address] [place], [place] [address] Phone: [phone]; Fax: [phone]

## 2017-12-28 NOTE — Telephone Encounter (Signed)
1405 HRC:BULAGT to Echo lab to start IV for Difinity contrast. # 22 angio right AC established after one attempt.Good blood return, flushed easily with NSS, secured and intact.     1440 hrs: IV removed intact from right AC, DSD applied, no bleeding noted at site

## 2017-12-28 NOTE — Progress Notes (Signed)
*  PRELIMINARY RESULTS* Echocardiogram 2D Echocardiogram with definity has been performed.  Leavy Cella 12/28/2017, 4:28 PM

## 2018-02-24 ENCOUNTER — Other Ambulatory Visit (HOSPITAL_COMMUNITY): Payer: Self-pay

## 2018-02-24 DIAGNOSIS — C8580 Other specified types of non-Hodgkin lymphoma, unspecified site: Secondary | ICD-10-CM

## 2018-02-24 DIAGNOSIS — D696 Thrombocytopenia, unspecified: Secondary | ICD-10-CM

## 2018-02-25 ENCOUNTER — Inpatient Hospital Stay (HOSPITAL_COMMUNITY): Payer: Medicare HMO | Attending: Hematology

## 2018-02-25 DIAGNOSIS — C8580 Other specified types of non-Hodgkin lymphoma, unspecified site: Secondary | ICD-10-CM | POA: Diagnosis not present

## 2018-02-25 DIAGNOSIS — D696 Thrombocytopenia, unspecified: Secondary | ICD-10-CM

## 2018-02-25 LAB — CBC WITH DIFFERENTIAL/PLATELET
BASOS PCT: 2 %
Basophils Absolute: 0.1 10*3/uL (ref 0.0–0.1)
EOS ABS: 0.4 10*3/uL (ref 0.0–0.7)
Eosinophils Relative: 6 %
HCT: 47.8 % (ref 39.0–52.0)
HEMOGLOBIN: 16.1 g/dL (ref 13.0–17.0)
Lymphocytes Relative: 29 %
Lymphs Abs: 2.3 10*3/uL (ref 0.7–4.0)
MCH: 29.1 pg (ref 26.0–34.0)
MCHC: 33.7 g/dL (ref 30.0–36.0)
MCV: 86.4 fL (ref 78.0–100.0)
MONOS PCT: 10 %
Monocytes Absolute: 0.8 10*3/uL (ref 0.1–1.0)
NEUTROS PCT: 55 %
Neutro Abs: 4.4 10*3/uL (ref 1.7–7.7)
PLATELETS: 122 10*3/uL — AB (ref 150–400)
RBC: 5.53 MIL/uL (ref 4.22–5.81)
RDW: 14.7 % (ref 11.5–15.5)
WBC: 8.1 10*3/uL (ref 4.0–10.5)

## 2018-02-25 LAB — COMPREHENSIVE METABOLIC PANEL
ALK PHOS: 65 U/L (ref 38–126)
ALT: 12 U/L (ref 0–44)
ANION GAP: 11 (ref 5–15)
AST: 16 U/L (ref 15–41)
Albumin: 4 g/dL (ref 3.5–5.0)
BUN: 41 mg/dL — ABNORMAL HIGH (ref 8–23)
CALCIUM: 9.4 mg/dL (ref 8.9–10.3)
CHLORIDE: 97 mmol/L — AB (ref 98–111)
CO2: 30 mmol/L (ref 22–32)
Creatinine, Ser: 3.49 mg/dL — ABNORMAL HIGH (ref 0.61–1.24)
GFR calc Af Amer: 19 mL/min — ABNORMAL LOW (ref >60)
GFR calc non Af Amer: 16 mL/min — ABNORMAL LOW (ref >60)
GLUCOSE: 134 mg/dL — AB (ref 70–99)
POTASSIUM: 3.4 mmol/L — AB (ref 3.5–5.1)
SODIUM: 138 mmol/L (ref 135–145)
Total Bilirubin: 1.3 mg/dL — ABNORMAL HIGH (ref 0.3–1.2)
Total Protein: 7.6 g/dL (ref 6.5–8.1)

## 2018-02-25 LAB — LACTATE DEHYDROGENASE: LDH: 113 U/L (ref 98–192)

## 2018-03-04 ENCOUNTER — Encounter (HOSPITAL_COMMUNITY): Payer: Self-pay | Admitting: Hematology

## 2018-03-04 ENCOUNTER — Other Ambulatory Visit: Payer: Self-pay

## 2018-03-04 ENCOUNTER — Inpatient Hospital Stay (HOSPITAL_COMMUNITY): Payer: Medicare HMO | Attending: Hematology | Admitting: Hematology

## 2018-03-04 VITALS — BP 87/53 | HR 37 | Temp 98.2°F | Resp 16 | Wt 311.1 lb

## 2018-03-04 DIAGNOSIS — Z8052 Family history of malignant neoplasm of bladder: Secondary | ICD-10-CM | POA: Insufficient documentation

## 2018-03-04 DIAGNOSIS — Z801 Family history of malignant neoplasm of trachea, bronchus and lung: Secondary | ICD-10-CM | POA: Diagnosis not present

## 2018-03-04 DIAGNOSIS — Z803 Family history of malignant neoplasm of breast: Secondary | ICD-10-CM | POA: Diagnosis not present

## 2018-03-04 DIAGNOSIS — R7989 Other specified abnormal findings of blood chemistry: Secondary | ICD-10-CM | POA: Insufficient documentation

## 2018-03-04 DIAGNOSIS — I10 Essential (primary) hypertension: Secondary | ICD-10-CM

## 2018-03-04 DIAGNOSIS — Z87891 Personal history of nicotine dependence: Secondary | ICD-10-CM | POA: Diagnosis not present

## 2018-03-04 DIAGNOSIS — R5383 Other fatigue: Secondary | ICD-10-CM | POA: Diagnosis not present

## 2018-03-04 DIAGNOSIS — Z8572 Personal history of non-Hodgkin lymphomas: Secondary | ICD-10-CM | POA: Diagnosis not present

## 2018-03-04 DIAGNOSIS — Z8042 Family history of malignant neoplasm of prostate: Secondary | ICD-10-CM | POA: Diagnosis not present

## 2018-03-04 DIAGNOSIS — Z800 Family history of malignant neoplasm of digestive organs: Secondary | ICD-10-CM | POA: Diagnosis not present

## 2018-03-04 DIAGNOSIS — C8580 Other specified types of non-Hodgkin lymphoma, unspecified site: Secondary | ICD-10-CM

## 2018-03-04 DIAGNOSIS — R600 Localized edema: Secondary | ICD-10-CM | POA: Insufficient documentation

## 2018-03-04 DIAGNOSIS — E119 Type 2 diabetes mellitus without complications: Secondary | ICD-10-CM | POA: Insufficient documentation

## 2018-03-04 DIAGNOSIS — Z9221 Personal history of antineoplastic chemotherapy: Secondary | ICD-10-CM | POA: Insufficient documentation

## 2018-03-04 NOTE — Progress Notes (Signed)
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 The mass is mild to moderate in metabolic activity consistent with low-grade lymphoma. Mild left external iliac and common iliac metabolic adenopathy.Single retroperitoneal node anterior to L renal vein      10/31/2014 - 11/21/2014 Chemotherapy    Single Agent Rituxan weekly x 4      01/21/2015 -  Antibody Plan         09/05/2015 - 09/23/2015 Radiation Therapy    Palliative XRT to palvic mass adjacent to bladder, 24 Gy in 12 fractions by Dr. Tammi Klippel      01/27/2016 PET scan    Hypermetabolic anterior left pelvic mass abutting the left superior bladder wall has decreased in size but mildly increased in metabolism, consistent with mild metabolic progression.      08/14/2016 PET scan    1. The mass along the left anterior urinary bladder is reduced in size and reduced in activity compared to the prior exam. The 2. Sub xiphoid os ossific structure with adjacent faint soft tissue density, the ossific structure has similar metabolic activity to other normal bony structures. 3. Mildly enlarged right subcarinal lymph node is not hypermetabolic and is below mediastinal activity. 4. Splenomegaly, without splenic hypermetabolic activity.      02/05/2017 Imaging    CT C/A/P: IMPRESSION: 1. Mild mixed changes. Soft tissue mass abutting the left  superior bladder wall is mildly decreased in size. Subxiphoid mass is stable. Mild left inguinal lymphadenopathy is stable. Subcarinal and AP window lymphadenopathy is mildly increased. 2. Stable mild splenomegaly. 3. Stable trace pericardial effusion/thickening. 4. Aortic atherosclerosis. Stable 4.0 cm ectatic ascending thoracic aorta. Recommend annual imaging followup by CTA or MRA. This recommendation follows 2010 ACCF/AHA/AATS/ACR/ASA/SCA/SCAI/SIR/STS/SVM Guidelines for the Diagnosis and Management of Patients with Thoracic Aortic Disease. Circulation. 2010; 121: Y924-M628. 5. Two-vessel coronary atherosclerosis. 6. Mild diffuse hepatic steatosis.         CANCER STAGING: Cancer Staging Marginal zone lymphoma [facility]) Staging form: Lymphoid Neoplasms, AJCC 6th Edition - Clinical stage from 11/07/2014: Stage II - Unsigned    INTERVAL HISTORY:  [name] [name] 72 y.o. male returns for routine follow-up for marginal zone lymphoma. Patient is here today doing well with very few complaints. He does have fatigue during the day and he has bilateral lower extremity edema. Complains of constipations occasionally. He has numbness and tingling in his feet however this is stable at this time. Patient lives at home and performs all his own ADLs and activities. Patients energy levels are about 25%. His appetite remains good at 75%.    REVIEW OF SYSTEMS:  Review of Systems  Constitutional: Positive for fatigue.  HENT:  Negative.   Eyes: Negative.   Respiratory: Negative.   Cardiovascular: Positive for leg swelling.  Gastrointestinal: Positive for constipation.  Endocrine: Negative.   Genitourinary: Negative.  Musculoskeletal: Negative.   Skin: Negative.   Neurological: Positive for numbness (in feet).  Hematological: Bruises/bleeds easily.  Psychiatric/Behavioral: Negative.      PAST MEDICAL/SURGICAL HISTORY:  Past Medical History:  Diagnosis Date  . CAD (coronary artery disease)     DES to LAD June 2018  . CKD (chronic kidney disease) stage 3, GFR 30-59 ml/min (HCC)   . Essential hypertension   . History of pneumonia   . Hyperlipidemia   . Hypothyroidism   . Morbid obesity ([place])   . Non Hodgkin's lymphoma ([place])    Status post XRT and chemotherapy  . NSTEMI (non-ST elevated myocardial infarction) [facility])    June 2018  . Peripheral neuropathy   . Sleep apnea   . Type 2 diabetes mellitus ([place])    Past Surgical History:  Procedure Laterality Date  . CHOLECYSTECTOMY  1992  . COLONOSCOPY N/A 09/03/2014   SLF:six colon polyps removed/small internal hemorrhoids  . CORONARY STENT INTERVENTION N/A 01/21/2017   Procedure: Coronary Stent Intervention;  Surgeon: Nelva Bush, MD;  Location: [place] [facility];  Service: Cardiovascular;  Laterality: N/A;  . ESOPHAGOGASTRODUODENOSCOPY N/A 09/03/2014   SLF: mild gastritis/few gastric polyps  . LEFT HEART CATH AND CORONARY ANGIOGRAPHY N/A 01/20/2017   Procedure: Left Heart Cath and Coronary Angiography;  Surgeon: Jettie Booze, MD;  Location: [place] [facility];  Service: Cardiovascular;  Laterality: N/A;  . TOTAL KNEE ARTHROPLASTY  11/10/2011   Procedure: TOTAL KNEE ARTHROPLASTY;  Surgeon: Mauri Pole, MD;  Location: [facility];  Service: Orthopedics;  Laterality: Right;     SOCIAL HISTORY:  Social History   Socioeconomic History  . Marital status: Divorced    Spouse name: Not on file  . Number of children: Not on file  . Years of education: Not on file  . Highest education level: Not on file  Occupational History  . Not on file  Social Needs  . Financial resource strain: Not on file  . Food insecurity:    Worry: Not on file    Inability: Not on file  . Transportation needs:    Medical: Not on file    Non-medical: Not on file  Tobacco Use  . Smoking status: Former Smoker    Packs/day: 1.00    Years: 30.00    Pack years: 30.00    Types: Cigarettes    Last attempt to quit: 08/04/2007    Years since quitting:  10.5  . Smokeless tobacco: Never Used  Substance and Sexual Activity  . Alcohol use: No    Alcohol/week: 0.0 oz  . Drug use: No  . Sexual activity: Yes  Lifestyle  . Physical activity:    Days per week: Not on file    Minutes per session: Not on file  . Stress: Not on file  Relationships  . Social connections:    Talks on phone: Not on file    Gets together: Not on file    Attends religious service: Not on file    Active member of club or organization: Not on file    Attends meetings of clubs or organizations: Not on file    Relationship status: Not on file  . Intimate partner violence:    Fear of current or ex partner: Not on file    Emotionally abused: Not on file    Physically abused: Not on file    Forced sexual activity: Not on file  Other Topics Concern  . Not on file  Social History  Narrative  . Not on file    FAMILY HISTORY:  Family History  Problem Relation Age of Onset  . Cancer Mother        breast and lung  . Cancer Father        bladder  . Cancer Maternal Uncle        prostate  . Cancer Paternal Uncle        esophagus  . Colon cancer Neg Hx     CURRENT MEDICATIONS:  Outpatient Encounter Medications as of 03/04/2018  Medication Sig Note  . albuterol (PROVENTIL HFA;VENTOLIN HFA) 108 (90 Base) MCG/ACT inhaler Inhale 2 puffs into the lungs every 6 (six) hours as needed for wheezing or shortness of breath.   Marland Kitchen amLODipine (NORVASC) 10 MG tablet Take 1 tablet (10 mg total) by mouth daily.   Marland Kitchen aspirin 81 MG tablet Take 81 mg by mouth daily.   . bisoprolol (ZEBETA) 5 MG tablet Take 1 tablet (5 mg total) by mouth daily.   . budesonide-formoterol (SYMBICORT) 80-4.5 MCG/ACT inhaler Inhale 2 puffs into the lungs 2 (two) times daily.    . calcipotriene (DOVONOX) 0.005 % cream Apply 1 application topically 2 (two) times daily.    . clopidogrel (PLAVIX) 75 MG tablet Take 1 tablet (75 mg total) by mouth daily.   . fluticasone (FLONASE) 50 MCG/ACT nasal spray Place 1  spray into both nostrils daily.   . furosemide (LASIX) 40 MG tablet Take 1 tablet (40 mg total) by mouth daily.   Marland Kitchen gabapentin (NEURONTIN) 100 MG capsule Take 100 mg by mouth at bedtime.   . insulin aspart (NOVOLOG FLEXPEN) 100 UNIT/ML FlexPen Inject into the skin. Sliding scale As needed for blood sugar   . insulin degludec (TRESIBA FLEXTOUCH) 100 UNIT/ML SOPN FlexTouch Pen Inject 0.5 mLs (50 Units total) into the skin daily at 10 pm. (Patient taking differently: Inject 60 Units into the skin daily at 10 pm. )   . ipratropium (ATROVENT) 0.03 % nasal spray    . ipratropium-albuterol (DUONEB) 0.5-2.5 (3) MG/3ML SOLN    . isosorbide mononitrate (IMDUR) 30 MG 24 hr tablet Take 1 tablet (30 mg total) by mouth daily.   Marland Kitchen levocetirizine (XYZAL) 5 MG tablet    . levothyroxine (SYNTHROID, LEVOTHROID) 100 MCG tablet Take 100 mcg by mouth daily before breakfast. 04/13/2017: Takes with 228mg  . levothyroxine (SYNTHROID, LEVOTHROID) 200 MCG tablet Take 200 mcg by mouth daily before breakfast. 07/24/2016: Takes with 1065m . losartan (COZAAR) 100 MG tablet Take 100 mg by mouth.   . montelukast (SINGULAIR) 10 MG tablet Take 10 mg by mouth. 08/10/2016: [facility] Take 1 tablet (10 mg total) by mouth nightly.  . nitroGLYCERIN (NITROSTAT) 0.4 MG SL tablet DISSOLVE ONE TABLET UNDER THE TONGUE EVERY 5 MINUTES AS NEEDED FOR CHEST PAIN. DO NOT EXCEED A TOTAL OF 3 DOSES IN 15 MINUTES. IF PAIN PERSI   . omega-3 acid ethyl esters (LOVAZA) 1 g capsule Take 1 capsule by mouth daily.    . simvastatin (ZOCOR) 5 MG tablet Take 5 mg by mouth daily.   . Sodium Chloride-Sodium Bicarb (NETI POT SINUS WASH) 2300-700 MG KIT Place 1 Dose into the nose 2 (two) times daily.   . traZODone (DESYREL) 50 MG tablet TAKE 1 TO 2 TABLETS AT BEDTIME FOR INSOMNIA   . [DISCONTINUED] nitroGLYCERIN (NITROSTAT) 0.4 MG SL tablet Place 1 tablet (0.4 mg total) under the tongue every 5 (five) minutes as  needed for chest pain. Up to 3 doses. If no relief after the 3rd dose, proceed to the ED for an evaluation    No facility-administered encounter medications on file as of 03/04/2018.     ALLERGIES:  No Known Allergies   PHYSICAL EXAM:  ECOG Performance status: 1  Vitals:   03/04/18 1440  BP: (!) 87/53  Pulse: (!) 37  Resp: 16  Temp: 98.2 F (36.8 C)  SpO2: 98%   Filed Weights   03/04/18 1440  Weight: (!) 311 lb 1.6 oz (141.1 kg)    Physical Exam  Constitutional: He is oriented to person, place, and time.  Cardiovascular: Normal rate, regular rhythm and normal heart sounds.  Pulmonary/Chest: Effort normal and breath sounds normal.  Neurological: He is alert and oriented to person, place, and time.  Skin: Skin is warm and dry.     LABORATORY DATA:  I have reviewed the labs as listed.  CBC    Component Value Date/Time   WBC 8.1 02/25/2018 1101   RBC 5.53 02/25/2018 1101   HGB 16.1 02/25/2018 1101   HCT 47.8 02/25/2018 1101   PLT 122 (L) 02/25/2018 1101   MCV 86.4 02/25/2018 1101   MCH 29.1 02/25/2018 1101   MCHC 33.7 02/25/2018 1101   RDW 14.7 02/25/2018 1101   LYMPHSABS 2.3 02/25/2018 1101   MONOABS 0.8 02/25/2018 1101   EOSABS 0.4 02/25/2018 1101   BASOSABS 0.1 02/25/2018 1101   CMP Latest Ref Rng & Units 02/25/2018 09/03/2017 08/16/2017  Glucose 70 - 99 mg/dL 134(H) 183(H) 112(H)  BUN 8 - 23 mg/dL 41(H) 27(H) 30(H)  Creatinine 0.61 - 1.24 mg/dL 3.49(H) 2.19(H) 2.46(H)  Sodium 135 - 145 mmol/L 138 136 140  Potassium 3.5 - 5.1 mmol/L 3.4(L) 3.6 3.9  Chloride 98 - 111 mmol/L 97(L) 104 104  CO2 22 - 32 mmol/L _0 Calcium 8.9 - 10.3 mg/dL 9.4 8.6(L) 9.0  Total Protein 6.5 - 8.1 g/dL 7.6 7.0 7.2  Total Bilirubin 0.3 - 1.2 mg/dL 1.3(H) 1.1 1.3(H)  Alkaline Phos 38 - 126 U/L 65 82 73  AST 15 - 41 U/L _1 ALT 0 - 44 U/L 12 12(L) 10(L)       DIAGNOSTIC IMAGING:  I have reviewed his PET CT scan dated 09/01/2017 which did not show any evidence of  active lymphoma.     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  Stage II marginal zone lymphoma: Diagnosed on 09/27/2014 by biopsy of the mass adjacent to the bladder - Status post rituximab weekly x4 (10/31/2014 through 11/21/2014) followed by every 2 monthly maintenance rituximab till 12/08/2016 - Does not have any B symptoms including, fevers night sweats or weight loss the last 6 months. -Physical examination did not reveal any palpable adenopathy. -I have discussed the results of his blood work including LDH level which are normal.  He has mildly decreased platelet count which has been stable. -His creatinine has slightly worsened during this visit.  His wife thinks that it is likely from allopurinol.  He will follow-up with his kidney doctor.      Orders placed this encounter:  Orders Placed This Encounter  Procedures  . CBC with Differential/Platelet  . Comprehensive metabolic panel  . Lactate dehydrogenase      Derek Jack, MD [place] [phone]

## 2018-03-04 NOTE — Patient Instructions (Signed)
[place] at [facility] Discharge Instructions  Follow up in 6 months with labs a few days prior. Call to make an appointment sooner if you have any new signs or symptoms.    Thank you for choosing [place] at [facility] to provide your oncology and hematology care.  To afford each patient quality time with our provider, please arrive at least 15 minutes before your scheduled appointment time.   If you have a lab appointment with the [facility] please come in thru the  Main Entrance and check in at the main information desk  You need to re-schedule your appointment should you arrive 10 or more minutes late.  We strive to give you quality time with our providers, and arriving late affects you and other patients whose appointments are after yours.  Also, if you no show three or more times for appointments you may be dismissed from the clinic at the providers discretion.     Again, thank you for choosing [facility].  Our hope is that these requests will decrease the amount of time that you wait before being seen by our physicians.       _____________________________________________________________  Should you have questions after your visit to [facility], please contact our office at [phone] between the hours of 8:00 a.m. and 4:30 p.m.  Voicemails left after 4:00 p.m. will not be returned until the following business day.  For prescription refill requests, have your pharmacy contact our office and allow 72 hours.    Cancer Center Support Programs:   > Cancer Support Group  2nd Tuesday of the month 1pm-2pm, Journey Room

## 2018-03-04 NOTE — Assessment & Plan Note (Signed)
1.  Stage II marginal zone lymphoma: Diagnosed on 09/27/2014 by biopsy of the mass adjacent to the bladder - Status post rituximab weekly x4 (10/31/2014 through 11/21/2014) followed by every 2 monthly maintenance rituximab till 12/08/2016 - Does not have any B symptoms including, fevers night sweats or weight loss the last 6 months. -Physical examination did not reveal any palpable adenopathy. -I have discussed the results of his blood work including LDH level which are normal.  He has mildly decreased platelet count which has been stable. -His creatinine has slightly worsened during this visit.  His wife thinks that it is likely from allopurinol.  He will follow-up with his kidney doctor.

## 2018-05-03 ENCOUNTER — Other Ambulatory Visit: Payer: Self-pay | Admitting: Orthopaedic Surgery

## 2018-05-04 ENCOUNTER — Other Ambulatory Visit: Payer: Self-pay | Admitting: Adult Health

## 2018-05-05 NOTE — Telephone Encounter (Signed)
Hydrocortisone cream refill, please advise.

## 2018-05-06 ENCOUNTER — Other Ambulatory Visit: Payer: Self-pay | Admitting: Orthopaedic Surgery

## 2018-05-06 NOTE — Telephone Encounter (Signed)
Yes, but any more refills should come from PCP. No further refillls

## 2018-05-12 NOTE — Pre-Procedure Instructions (Signed)
[name] [name]  05/12/2018      [facility] - [facility], [place] [address] Phone: [phone] Fax: [place] [address] [place] - [place] [address] [place] [address] Phone: [phone] Fax: [facility] - [place], [place] [address] Phone: [phone] Fax: [phone]  [facility] [address] [place] - [address] [place] [address] [place] [address] [place] [address] [place] [address] Phone: [phone] Fax: [phone]    Your procedure is scheduled on May 24, 2018.  Report to [facility] Admitting at 1045 AM.  Call this number if you have problems the morning of surgery:  [phone]   Remember:  Do not eat or drink after midnight.    Take these medicines the morning of surgery with A SIP OF WATER  Bisprolol (zebeta) Amlodipine (norvasc) Levothyroxine (synthroid) Albuterol inhaler-if needed Symbitcort inhaler-if needed Nitrostat-if needed for chest pain  Please bring inhalers with you the day of surgery  Follow your surgeon's instructions on when to hold/resume Plavix/aspirin.  If no instructions were given call the office to determine how they would like to you take Plavix/aspirin  7 days prior to surgery STOP taking any Aleve, Naproxen, Ibuprofen, Motrin, Advil, Goody's, BC's, all herbal medications, fish oil, and all vitamins  WHAT DO I DO ABOUT MY DIABETES MEDICATION?   Marland Kitchen Do not take oral diabetes medicines (pills) the morning of surgery.  . THE NIGHT BEFORE SURGERY, take your normal dose of   novolog insulin with dinner and 32 units of Tresiba insulin (1/2 of your normal dose).     . THE MORNING OF SURGERY, take NO insulin unless your CBG is greater than 220 mg/dL, then you may take  of your sliding scale (correction) dose of insulin (Novolog insulin)  . The day of surgery, do  not take other diabetes injectables, including Byetta (exenatide), Bydureon (exenatide ER), Victoza (liraglutide), or Trulicity (dulaglutide).  . If your CBG is greater than 220 mg/dL, you may take  of your sliding scale (correction) dose of insulin.   How to Manage Your Diabetes Before and After Surgery  Why is it important to control my blood sugar before and after surgery? . Improving blood sugar levels before and after surgery helps healing and can limit problems. . A way of improving blood sugar control is eating a healthy diet by: o  Eating less sugar and carbohydrates o  Increasing activity/exercise o  Talking with your doctor about reaching your blood sugar goals . High blood sugars (greater than 180 mg/dL) can raise your risk of infections and slow your recovery, so you will need to focus on controlling your diabetes during the weeks before surgery. . Make sure that the doctor who takes care of your diabetes knows about your planned surgery including the date and location.  How do I manage my blood sugar before surgery? . Check your blood sugar at least 4 times a day, starting 2 days before surgery, to make sure that the level is not too high or low. o Check your blood sugar the morning of your surgery when you wake up and every 2 hours until you get to the Short Stay unit. . If your blood sugar is less than 70 mg/dL, you will need to treat for low blood sugar: o Do not take insulin. o  Treat a low blood sugar (less than 70 mg/dL) with  cup of clear juice (cranberry or apple), 4 glucose tablets, OR glucose gel. Recheck blood sugar in 15 minutes after treatment (to make sure it is greater than 70 mg/dL). If your blood sugar is not greater than 70 mg/dL on recheck, call [phone] o  for further instructions. . Report your blood sugar to the short stay nurse when you get to Short Stay.  . If you are admitted to the hospital after surgery: o Your blood sugar will be checked by  the staff and you will probably be given insulin after surgery (instead of oral diabetes medicines) to make sure you have good blood sugar levels. o The goal for blood sugar control after surgery is 80-180 mg/dL.  Reviewed and Endorsed by [facility] Patient Education Committee, August 2015    Do not wear jewelry  Do not wear lotions, powders, or colognes, or deodorant.  Men may shave face and neck.  Do not bring valuables to the hospital.  [facility] is not responsible for any belongings or valuables.  Contacts, dentures or bridgework may not be worn into surgery.  Leave your suitcase in the car.  After surgery it may be brought to your room.  For patients admitted to the hospital, discharge time will be determined by your treatment team.  Patients discharged the day of surgery will not be allowed to drive home.   [place]- Preparing For Surgery  Before surgery, you can play an important role. Because skin is not sterile, your skin needs to be as free of germs as possible. You can reduce the number of germs on your skin by washing with CHG (chlorahexidine gluconate) Soap before surgery.  CHG is an antiseptic cleaner which kills germs and bonds with the skin to continue killing germs even after washing.    Oral Hygiene is also important to reduce your risk of infection.  Remember - BRUSH YOUR TEETH THE MORNING OF SURGERY WITH YOUR REGULAR TOOTHPASTE  Please do not use if you have an allergy to CHG or antibacterial soaps. If your skin becomes reddened/irritated stop using the CHG.  Do not shave (including legs and underarms) for at least 48 hours prior to first CHG shower. It is OK to shave your face.  Please follow these instructions carefully.   1. Shower the NIGHT BEFORE SURGERY and the MORNING OF SURGERY with CHG.   2. If you chose to wash your hair, wash your hair first as usual with your normal shampoo.  3. After you shampoo, rinse your hair and body thoroughly to remove the  shampoo.  4. Use CHG as you would any other liquid soap. You can apply CHG directly to the skin and wash gently with a scrungie or a clean washcloth.   5. Apply the CHG Soap to your body ONLY FROM THE NECK DOWN.  Do not use on open wounds or open sores. Avoid contact with your eyes, ears, mouth and genitals (private parts). Wash Face and genitals (private parts)  with your normal soap.  6. Wash thoroughly, paying special attention to the area where your surgery will be performed.  7. Thoroughly rinse your body with warm water from the neck down.  8. DO NOT shower/wash with your normal soap after using and rinsing off the CHG Soap.  9. Pat yourself dry with a CLEAN TOWEL.  10. Wear CLEAN PAJAMAS to bed the night before surgery, wear comfortable clothes the morning of  surgery  11. Place CLEAN SHEETS on your bed the night of your first shower and DO NOT SLEEP WITH PETS.  Day of Surgery:  Do not apply any deodorants/lotions.  Please wear clean clothes to the hospital/surgery center.   Remember to brush your teeth WITH YOUR REGULAR TOOTHPASTE.   Please read over the fact sheets that you were given.

## 2018-05-12 NOTE — Progress Notes (Addendum)
PCP: [place] at [place]  Cardiologist: Rozann Lesches, MD  EKG: 05/13/18 in EPIC  Stress test: pt denies past 5-10  years  ECHO: 12/28/17  Cardiac Cath: 01/2017 in EPIC  Chest x-ray: 05/13/18 in Epic  Patient advised to hold plavix beginning 05/06/18 and to continue ASA 81 mg by MD

## 2018-05-13 ENCOUNTER — Encounter (HOSPITAL_COMMUNITY)
Admission: RE | Admit: 2018-05-13 | Discharge: 2018-05-13 | Disposition: A | Discharge status: Home / Self Care | Payer: Medicare HMO | Source: Ambulatory Visit | Attending: Orthopaedic Surgery | Admitting: Orthopaedic Surgery

## 2018-05-13 ENCOUNTER — Encounter (HOSPITAL_COMMUNITY): Payer: Self-pay

## 2018-05-13 ENCOUNTER — Other Ambulatory Visit: Payer: Self-pay

## 2018-05-13 ENCOUNTER — Ambulatory Visit (HOSPITAL_COMMUNITY)
Admission: RE | Admit: 2018-05-13 | Discharge: 2018-05-13 | Disposition: A | Discharge status: Home / Self Care | Payer: Medicare HMO | Source: Ambulatory Visit | Attending: Orthopaedic Surgery | Admitting: Orthopaedic Surgery

## 2018-05-13 DIAGNOSIS — Z01818 Encounter for other preprocedural examination: Secondary | ICD-10-CM | POA: Diagnosis present

## 2018-05-13 HISTORY — DX: Left bundle-branch block, unspecified: I44.7

## 2018-05-13 HISTORY — DX: Adverse effect of unspecified anesthetic, initial encounter: T41.45XA

## 2018-05-13 LAB — TYPE AND SCREEN
ABO/RH(D): O POS
ANTIBODY SCREEN: NEGATIVE

## 2018-05-13 LAB — BASIC METABOLIC PANEL
ANION GAP: 14 (ref 5–15)
BUN: 37 mg/dL — ABNORMAL HIGH (ref 8–23)
CO2: 26 mmol/L (ref 22–32)
Calcium: 9.1 mg/dL (ref 8.9–10.3)
Chloride: 97 mmol/L — ABNORMAL LOW (ref 98–111)
Creatinine, Ser: 2.86 mg/dL — ABNORMAL HIGH (ref 0.61–1.24)
GFR, EST AFRICAN AMERICAN: 24 mL/min — AB (ref >60)
GFR, EST NON AFRICAN AMERICAN: 21 mL/min — AB (ref >60)
GLUCOSE: 176 mg/dL — AB (ref 70–99)
POTASSIUM: 3 mmol/L — AB (ref 3.5–5.1)
Sodium: 137 mmol/L (ref 135–145)

## 2018-05-13 LAB — CBC WITH DIFFERENTIAL/PLATELET
ABS IMMATURE GRANULOCYTES: 0.02 10*3/uL (ref 0.00–0.07)
BASOS ABS: 0.1 10*3/uL (ref 0.0–0.1)
BASOS PCT: 1 %
Eosinophils Absolute: 0.3 10*3/uL (ref 0.0–0.5)
Eosinophils Relative: 3 %
HCT: 46 % (ref 39.0–52.0)
Hemoglobin: 15.3 g/dL (ref 13.0–17.0)
IMMATURE GRANULOCYTES: 0 %
Lymphocytes Relative: 22 %
Lymphs Abs: 1.7 10*3/uL (ref 0.7–4.0)
MCH: 28.5 pg (ref 26.0–34.0)
MCHC: 33.3 g/dL (ref 30.0–36.0)
MCV: 85.8 fL (ref 80.0–100.0)
MONOS PCT: 7 %
Monocytes Absolute: 0.5 10*3/uL (ref 0.1–1.0)
NEUTROS ABS: 4.9 10*3/uL (ref 1.7–7.7)
NEUTROS PCT: 67 %
NRBC: 0 % (ref 0.0–0.2)
PLATELETS: 162 10*3/uL (ref 150–400)
RBC: 5.36 MIL/uL (ref 4.22–5.81)
RDW: 14.7 % (ref 11.5–15.5)
WBC: 7.4 10*3/uL (ref 4.0–10.5)

## 2018-05-13 LAB — URINALYSIS, ROUTINE W REFLEX MICROSCOPIC
Bilirubin Urine: NEGATIVE
GLUCOSE, UA: NEGATIVE mg/dL
KETONES UR: NEGATIVE mg/dL
LEUKOCYTES UA: NEGATIVE
Nitrite: NEGATIVE
PH: 6 (ref 5.0–8.0)
Protein, ur: NEGATIVE mg/dL
Specific Gravity, Urine: 1.013 (ref 1.005–1.030)

## 2018-05-13 LAB — HEMOGLOBIN A1C
Hgb A1c MFr Bld: 4.9 % (ref 4.8–5.6)
Mean Plasma Glucose: 93.93 mg/dL

## 2018-05-13 LAB — APTT: APTT: 34 s (ref 24–36)

## 2018-05-13 LAB — PROTIME-INR
INR: 1.19
PROTHROMBIN TIME: 15 s (ref 11.4–15.2)

## 2018-05-13 LAB — GLUCOSE, CAPILLARY: Glucose-Capillary: 170 mg/dL — ABNORMAL HIGH (ref 70–99)

## 2018-05-13 LAB — ABO/RH: ABO/RH(D): O POS

## 2018-05-13 LAB — SURGICAL PCR SCREEN
MRSA, PCR: NEGATIVE
STAPHYLOCOCCUS AUREUS: NEGATIVE

## 2018-05-16 ENCOUNTER — Encounter (HOSPITAL_COMMUNITY): Payer: Self-pay

## 2018-05-16 NOTE — Progress Notes (Signed)
Anesthesia Chart Review:   Case:  [id number] Date/Time:  05/24/18 1230   Procedure:  LEFT TOTAL KNEE ARTHROPLASTY (Left Knee)   Anesthesia type:  Spinal   Pre-op diagnosis:  LEFT KNEE DEGENERATIVE JOINT DISEASE   Location:  [facility] OR ROOM 05 / [place] [facility]   Surgeon:  Melrose Nakayama, MD      DISCUSSION: - Pt is a 72 year old male with hx CAD (DES to LAD 01/2017), LBBB, HTN, DM, non-Hodgkin's lymphoma, CKD (stage IV), OSA. Current smoker.   - Pt has cardiac clearance for surgery at high risk from Dr. Domenic Polite.   - Pt to stop plavix 05/06/18 and continue ASA perioperatively.    VS: BP (!) 109/57   Pulse (!) 43   Temp 36.5 C   Resp 20   Ht '6\' 1"'$  (1.854 m)   Wt 134.7 kg   SpO2 96%   BMI 39.18 kg/m    PROVIDERS: - Primary care at [facility] [place] [facility]. PCP's office aware of CKD stage IV (notes in care everywhere)  - Cardiologist is Rozann Lesches, MD. Last office visit 12/28/17. Dr. Domenic Polite cleared pt for surgery   LABS:  - Cr 2.86, BUN 37.  This is consistent with prior results. Pt with hx CKD stage IV.   (all labs ordered are listed, but only abnormal results are displayed)  Labs Reviewed  GLUCOSE, CAPILLARY - Abnormal; Notable for the following components:      Result Value   Glucose-Capillary 170 (*)    All other components within normal limits  URINALYSIS, ROUTINE W REFLEX MICROSCOPIC - Abnormal; Notable for the following components:   Hgb urine dipstick SMALL (*)    Bacteria, UA RARE (*)    All other components within normal limits  BASIC METABOLIC PANEL - Abnormal; Notable for the following components:   Potassium 3.0 (*)    Chloride 97 (*)    Glucose, Bld 176 (*)    BUN 37 (*)    Creatinine, Ser 2.86 (*)    GFR calc non Af Amer 21 (*)    GFR calc Af Amer 24 (*)    All other components within normal limits  SURGICAL PCR SCREEN  HEMOGLOBIN A1C  APTT  CBC WITH DIFFERENTIAL/PLATELET  PROTIME-INR  TYPE AND SCREEN  ABO/RH     IMAGES:  CXR  05/13/18: No active cardiopulmonary disease.   EKG 05/13/18: Sinus bradycardia with 1st degree A-V block. LAD. LBBB.    CV:  Echo 12/28/17:  - Left ventricle: The cavity size was normal. Wall thickness was increased in a pattern of moderate LVH. Images are limited, therefore Definity contrast was used. Systolic function was normal. The estimated ejection fraction was in the range of 55% to 60%. Wall motion was normal; there were no regional wall motion abnormalities. Indeterminate diastolic function, possibly grade 2. - Aortic valve: Mildly calcified annulus. Trileaflet; mildly calcified leaflets. There was trivial regurgitation. - Aortic root: The aortic root was mildly ectatic. - Mitral valve: Mildly calcified annulus. There was trivial regurgitation. - Right atrium: Central venous pressure (est): 3 mm Hg. - Tricuspid valve: There was trivial regurgitation. - Pulmonary arteries: Systolic pressure could not be accurately estimated. - Pericardium, extracardiac: There was no pericardial effusion.  Cardiac cath 01/21/17:  1. Significant multivessel coronary artery disease (see yesterday's diagnostic catheterization for details). 2. Successful PCI to mid LAD with placement of a Synergy 3.0 x 32 mm drug-eluting stent with 0% residual stenosis and TIMI-3 flow. 3.   Past  Medical History:  Diagnosis Date  . CAD (coronary artery disease)    DES to LAD June 2018  . CKD (chronic kidney disease) stage 3, GFR 30-59 ml/min (HCC)   . Complication of anesthesia    patient had an epidural with his right knee and it took him 6 hours before he could move, he does not want another one  . Essential hypertension   . History of pneumonia   . Hyperlipidemia   . Hypothyroidism   . LBBB (left bundle branch block)   . Morbid obesity ([place])   . Non Hodgkin's lymphoma ([place])    Status post XRT and chemotherapy  . NSTEMI (non-ST elevated myocardial infarction) [facility])    June 2018  . Peripheral neuropathy   .  Sleep apnea   . Type 2 diabetes mellitus ([place])     Past Surgical History:  Procedure Laterality Date  . CHOLECYSTECTOMY  1992  . COLONOSCOPY N/A 09/03/2014   SLF:six colon polyps removed/small internal hemorrhoids  . CORONARY STENT INTERVENTION N/A 01/21/2017   Procedure: Coronary Stent Intervention;  Surgeon: Nelva Bush, MD;  Location: [place];  Service: Cardiovascular;  Laterality: N/A;  . ESOPHAGOGASTRODUODENOSCOPY N/A 09/03/2014   SLF: mild gastritis/few gastric polyps  . LEFT HEART CATH AND CORONARY ANGIOGRAPHY N/A 01/20/2017   Procedure: Left Heart Cath and Coronary Angiography;  Surgeon: Jettie Booze, MD;  Location: [facility] [facility];  Service: Cardiovascular;  Laterality: N/A;  . TOTAL KNEE ARTHROPLASTY  11/10/2011   Procedure: TOTAL KNEE ARTHROPLASTY;  Surgeon: Mauri Pole, MD;  Location: [facility];  Service: Orthopedics;  Laterality: Right;    MEDICATIONS: . albuterol (PROVENTIL HFA;VENTOLIN HFA) 108 (90 Base) MCG/ACT inhaler  . amLODipine (NORVASC) 10 MG tablet  . aspirin 81 MG tablet  . bisoprolol (ZEBETA) 5 MG tablet  . budesonide-formoterol (SYMBICORT) 80-4.5 MCG/ACT inhaler  . clopidogrel (PLAVIX) 75 MG tablet  . furosemide (LASIX) 40 MG tablet  . hydrocortisone 2.5 % cream  . insulin aspart (NOVOLOG FLEXPEN) 100 UNIT/ML FlexPen  . insulin degludec (TRESIBA FLEXTOUCH) 100 UNIT/ML SOPN FlexTouch Pen  . isosorbide mononitrate (IMDUR) 30 MG 24 hr tablet  . levocetirizine (XYZAL) 5 MG tablet  . levothyroxine (SYNTHROID, LEVOTHROID) 100 MCG tablet  . levothyroxine (SYNTHROID, LEVOTHROID) 200 MCG tablet  . losartan-hydrochlorothiazide (HYZAAR) 100-25 MG tablet  . montelukast (SINGULAIR) 10 MG tablet  . nitroGLYCERIN (NITROSTAT) 0.4 MG SL tablet  . simvastatin (ZOCOR) 5 MG tablet  . Sodium Chloride-Sodium Bicarb (NETI POT SINUS WASH) 2300-700 MG KIT  . traZODone (DESYREL) 50 MG tablet   No current facility-administered medications for this encounter.     - Pt to stop plavix 05/06/18 and continue ASA perioperatively.    If no changes, I anticipate pt can proceed with surgery as scheduled.   Willeen Cass, FNP-BC [facility] Short Stay Surgical Center/Anesthesiology Phone: [phone] 05/16/2018 2:33 PM

## 2018-05-19 DIAGNOSIS — M1712 Unilateral primary osteoarthritis, left knee: Secondary | ICD-10-CM | POA: Diagnosis present

## 2018-05-19 NOTE — H&P (Signed)
TOTAL KNEE ADMISSION H&P  Patient is being admitted for left total knee arthroplasty.  Subjective:  Chief Complaint:left knee pain.  HPI: [name] [name], 72 y.o. male, has a history of pain and functional disability in the left knee due to arthritis and has failed non-surgical conservative treatments for greater than 12 weeks to includeNSAID's and/or analgesics, corticosteriod injections, flexibility and strengthening excercises, supervised PT with diminished ADL's post treatment, use of assistive devices, weight reduction as appropriate and activity modification.  Onset of symptoms was gradual, starting 5 years ago with gradually worsening course since that time. The patient noted no past surgery on the left knee(s).  Patient currently rates pain in the left knee(s) at 10 out of 10 with activity. Patient has night pain, worsening of pain with activity and weight bearing, pain that interferes with activities of daily living, crepitus and joint swelling.  Patient has evidence of subchondral cysts, subchondral sclerosis, periarticular osteophytes and joint space narrowing by imaging studies. There is no active infection.  Patient Active Problem List   Diagnosis Date Noted  . Primary localized osteoarthritis of left knee 05/19/2018  . Acute on chronic systolic and diastolic heart failure, NYHA class 1 ([place]) 04/01/2017  . CAD (coronary artery disease) 04/01/2017  . SOB (shortness of breath) 03/21/2017  . HTN (hypertension) 03/21/2017  . NSTEMI (non-ST elevated myocardial infarction) ([place]) 01/19/2017  . NSTEMI, initial episode of care ([place]) 01/19/2017  . Sepsis ([place]) 07/24/2016  . Elevated troponin I level 07/24/2016  . Fever 07/24/2016  . Lactic acidosis 07/24/2016  . Adjustment insomnia 05/18/2016  . Erectile dysfunction 12/19/2015  . Sleep apnea 09/30/2015  . Osteoarthritis 09/30/2015  . Hypothyroidism 09/30/2015  . Hypogonadism in male 09/30/2015  . Diabetic neuropathy ([place]) 09/30/2015  .  Chronic pain of left knee 09/30/2015  . Benign essential hypertension 09/30/2015  . Acute on chronic kidney failure ([place]) 05/14/2015  . Diarrhea 05/14/2015  . Dehydration 05/14/2015  . Hypokalemia 05/14/2015  . 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 Pelvic mass in male   . Varices, esophageal ([place])   . Colonic mass   . Abnormal CT scan, pelvis   . Bladder mass   . Elevated liver enzymes   . Elevated LFTs   . Abdominal pain 07/23/2014  . Chronic kidney disease Stage IV 07/23/2014  . Morbid obesity ([place]) 07/23/2014  . Type 2 diabetes mellitus with stage 4 chronic kidney disease ([place]) 07/23/2014  . Hypertension 07/23/2014  . S/P right TKA 11/11/2011   Past Medical History:  Diagnosis Date  . CAD (coronary artery disease)    DES to LAD June 2018  . CKD (chronic kidney disease) stage 3, GFR 30-59 ml/min (HCC)   . Complication of anesthesia    patient had an epidural with his right knee and it took him 6 hours before he could move, he does not want another one  . Essential hypertension   . History of pneumonia   . Hyperlipidemia   . Hypothyroidism   . LBBB (left bundle branch block)   . Morbid obesity ([place])   . Non Hodgkin's lymphoma ([place])    Status post XRT and chemotherapy  . NSTEMI (non-ST elevated myocardial infarction) [facility])    June 2018  . Peripheral neuropathy   . Sleep apnea   . Type 2 diabetes mellitus ([place])     Past Surgical History:  Procedure Laterality Date  . CHOLECYSTECTOMY  1992  . COLONOSCOPY N/A 09/03/2014   SLF:six colon polyps removed/small internal hemorrhoids  .  CORONARY STENT INTERVENTION N/A 01/21/2017   Procedure: Coronary Stent Intervention;  Surgeon: Nelva Bush, MD;  Location: [place] [facility];  Service: Cardiovascular;  Laterality: N/A;  . ESOPHAGOGASTRODUODENOSCOPY N/A 09/03/2014   SLF: mild gastritis/few gastric polyps  . LEFT HEART CATH AND CORONARY ANGIOGRAPHY N/A 01/20/2017   Procedure: Left Heart Cath and Coronary Angiography;   Surgeon: Jettie Booze, MD;  Location: [facility];  Service: Cardiovascular;  Laterality: N/A;  . TOTAL KNEE ARTHROPLASTY  11/10/2011   Procedure: TOTAL KNEE ARTHROPLASTY;  Surgeon: Mauri Pole, MD;  Location: [facility];  Service: Orthopedics;  Laterality: Right;    No current facility-administered medications for this encounter.    Current Outpatient Medications  Medication Sig Dispense Refill Last Dose  . albuterol (PROVENTIL HFA;VENTOLIN HFA) 108 (90 Base) MCG/ACT inhaler Inhale 2 puffs into the lungs every 6 (six) hours as needed for wheezing or shortness of breath. 1 Inhaler 2 Taking  . amLODipine (NORVASC) 10 MG tablet Take 1 tablet (10 mg total) by mouth daily. 30 tablet 11 Taking  . aspirin 81 MG tablet Take 81 mg by mouth daily.   Taking  . bisoprolol (ZEBETA) 5 MG tablet Take 1 tablet (5 mg total) by mouth daily. 30 tablet 6 Taking  . budesonide-formoterol (SYMBICORT) 80-4.5 MCG/ACT inhaler Inhale 2 puffs into the lungs daily as needed (shortness of breath).    Taking  . clopidogrel (PLAVIX) 75 MG tablet Take 1 tablet (75 mg total) by mouth daily. 30 tablet 0 Taking  . furosemide (LASIX) 40 MG tablet Take 1 tablet (40 mg total) by mouth daily. 30 tablet 11 Taking  . insulin aspart (NOVOLOG FLEXPEN) 100 UNIT/ML FlexPen Inject 4-5 Units into the skin daily as needed (blood sugar above 200).    Taking  . insulin degludec (TRESIBA FLEXTOUCH) 100 UNIT/ML SOPN FlexTouch Pen Inject 0.5 mLs (50 Units total) into the skin daily at 10 pm. (Patient taking differently: Inject 65 Units into the skin daily at 10 pm. )   Taking  . levocetirizine (XYZAL) 5 MG tablet Take 5 mg by mouth every evening.    Taking  . levothyroxine (SYNTHROID, LEVOTHROID) 100 MCG tablet Take 100 mcg by mouth daily before breakfast.   Taking  . levothyroxine (SYNTHROID, LEVOTHROID) 200 MCG tablet Take 200 mcg by mouth daily before breakfast.   Taking  . losartan-hydrochlorothiazide (HYZAAR) 100-25 MG tablet Take  1 tablet by mouth daily.     . montelukast (SINGULAIR) 10 MG tablet Take 10 mg by mouth at bedtime.    Taking  . nitroGLYCERIN (NITROSTAT) 0.4 MG SL tablet Place 0.4 mg under the tongue every 5 (five) minutes as needed for chest pain.   11 Taking  . simvastatin (ZOCOR) 5 MG tablet Take 5 mg by mouth daily.   Taking  . traZODone (DESYREL) 50 MG tablet Take 50 mg by mouth at bedtime.    Taking  . hydrocortisone 2.5 % cream APPLY TOPICALLY DAILY AS NEEDED TO CATH SITE (Patient not taking: Reported on 05/06/2018) 20 g 0 Not Taking at Unknown time  . isosorbide mononitrate (IMDUR) 30 MG 24 hr tablet Take 1 tablet (30 mg total) by mouth daily. (Patient not taking: Reported on 05/06/2018) 30 tablet 1 Not Taking at Unknown time  . Sodium Chloride-Sodium Bicarb (NETI POT SINUS WASH) 2300-700 MG KIT Place 1 Dose into the nose 2 (two) times daily. 1 each 0 Taking   No Known Allergies  Social History   Tobacco Use  .  Smoking status: Current Some Day Smoker    Packs/day: 0.25    Years: 30.00    Pack years: 7.50    Types: Cigarettes    Last attempt to quit: 08/04/2007    Years since quitting: 10.7  . Smokeless tobacco: Never Used  . Tobacco comment: "smoke a cigarette every now and then"  Substance Use Topics  . Alcohol use: No    Alcohol/week: 0.0 standard drinks    Family History  Problem Relation Age of Onset  . Cancer Mother        breast and lung  . Cancer Father        bladder  . Cancer Maternal Uncle        prostate  . Cancer Paternal Uncle        esophagus  . Colon cancer Neg Hx      Review of Systems  Musculoskeletal: Positive for joint pain.       Left knee  All other systems reviewed and are negative.   Objective:  Physical Exam  Constitutional: He is oriented to person, place, and time. He appears well-developed and well-nourished.  HENT:  Head: Normocephalic and atraumatic.  Eyes: Pupils are equal, round, and reactive to light.  Neck: Normal range of motion.   Cardiovascular: Normal rate and regular rhythm.  Respiratory: Effort normal.  GI: Soft.  Musculoskeletal:  Left knee motion is 5-95.  His calf is soft and nontender.  There is some crepitation and medial joint line pain.  Hip motion is full and straight leg raise is negative.   Neurological: He is alert and oriented to person, place, and time.  Skin: Skin is warm and dry.  Psychiatric: He has a normal mood and affect. His behavior is normal. Judgment and thought content normal.    Vital signs in last 24 hours:    Labs:   Estimated body mass index is 39.18 kg/m as calculated from the following:   Height as of 05/13/18: 6' 1"  (1.854 m).   Weight as of 05/13/18: 134.7 kg.   Imaging Review Plain radiographs demonstrate severe degenerative joint disease of the left knee(s). The overall alignment isneutral. The bone quality appears to be good for age and reported activity level.   Preoperative templating of the joint replacement has been completed, documented, and submitted to the Operating Room personnel in order to optimize intra-operative equipment management.    Patient's anticipated LOS is less than 2 midnights, meeting these requirements: - Younger than 53 - Lives within 1 hour of care - Has a competent adult at home to recover with post-op recover - NO history of  - Chronic pain requiring opiods  - Diabetes  - Coronary Artery Disease  - Heart failure  - Heart attack  - Stroke  - DVT/VTE  - Cardiac arrhythmia  - Respiratory Failure/COPD  - Renal failure  - Anemia  - Advanced Liver disease   Assessment/Plan:  End stage primary arthritis, left knee   The patient history, physical examination, clinical judgment of the provider and imaging studies are consistent with end stage degenerative joint disease of the left knee(s) and total knee arthroplasty is deemed medically necessary. The treatment options including medical management, injection therapy arthroscopy and  arthroplasty were discussed at length. The risks and benefits of total knee arthroplasty were presented and reviewed. The risks due to aseptic loosening, infection, stiffness, patella tracking problems, thromboembolic complications and other imponderables were discussed. The patient acknowledged the explanation, agreed to proceed with the  plan and consent was signed. Patient is being admitted for inpatient treatment for surgery, pain control, PT, OT, prophylactic antibiotics, VTE prophylaxis, progressive ambulation and ADL's and discharge planning. The patient is planning to be discharged home with home health services

## 2018-05-23 MED ORDER — TRANEXAMIC ACID-NACL 1000-0.7 MG/100ML-% IV SOLN
1000.0000 mg | INTRAVENOUS | Status: AC
  Administered 2018-05-24: 1000 mg via INTRAVENOUS
  Filled 2018-05-23: qty 100

## 2018-05-23 MED ORDER — DEXTROSE 5 % IV SOLN
3.0000 g | INTRAVENOUS | Status: AC
  Administered 2018-05-24: 3 g via INTRAVENOUS
  Filled 2018-05-23: qty 3000

## 2018-05-23 NOTE — Progress Notes (Signed)
Anesthesia follow-up: See PAT note from Willeen Cass, [place]. Since then patient was seen by nephrologist Red Christians, MD on 05/23/18 ([place]). Dr. Neta Ehlers reviewed recent 05/13/18 done at [facility]. Previous baseline 2.3-2.5, with peak at 3.49 on 02/25/18. Overall BP and renal function felt stable. He wrote: "He should be okay to undergo knee surgery from a renal standpoint."  [facility] Short Stay Center/Anesthesiology Phone [phone] 05/23/2018 12:55 PM

## 2018-05-24 ENCOUNTER — Ambulatory Visit (HOSPITAL_COMMUNITY): Payer: Medicare HMO | Admitting: Certified Registered"

## 2018-05-24 ENCOUNTER — Inpatient Hospital Stay (HOSPITAL_COMMUNITY)
Admission: AD | Admit: 2018-05-24 | Discharge: 2018-05-28 | DRG: 470 | Disposition: A | Discharge status: Skilled Nursing Facility | Payer: Medicare HMO | Attending: Orthopaedic Surgery | Admitting: Orthopaedic Surgery

## 2018-05-24 ENCOUNTER — Other Ambulatory Visit: Payer: Self-pay

## 2018-05-24 ENCOUNTER — Encounter (HOSPITAL_COMMUNITY): Payer: Self-pay

## 2018-05-24 ENCOUNTER — Ambulatory Visit (HOSPITAL_COMMUNITY): Payer: Medicare HMO | Admitting: Emergency Medicine

## 2018-05-24 ENCOUNTER — Encounter (HOSPITAL_COMMUNITY)
Admission: AD | Disposition: A | Discharge status: Skilled Nursing Facility | Payer: Self-pay | Source: Home / Self Care | Attending: Orthopaedic Surgery

## 2018-05-24 DIAGNOSIS — Z8042 Family history of malignant neoplasm of prostate: Secondary | ICD-10-CM

## 2018-05-24 DIAGNOSIS — Z7902 Long term (current) use of antithrombotics/antiplatelets: Secondary | ICD-10-CM

## 2018-05-24 DIAGNOSIS — Z8052 Family history of malignant neoplasm of bladder: Secondary | ICD-10-CM

## 2018-05-24 DIAGNOSIS — E1142 Type 2 diabetes mellitus with diabetic polyneuropathy: Secondary | ICD-10-CM | POA: Diagnosis present

## 2018-05-24 DIAGNOSIS — E1151 Type 2 diabetes mellitus with diabetic peripheral angiopathy without gangrene: Secondary | ICD-10-CM | POA: Diagnosis present

## 2018-05-24 DIAGNOSIS — Z923 Personal history of irradiation: Secondary | ICD-10-CM

## 2018-05-24 DIAGNOSIS — Z9049 Acquired absence of other specified parts of digestive tract: Secondary | ICD-10-CM

## 2018-05-24 DIAGNOSIS — Z96651 Presence of right artificial knee joint: Secondary | ICD-10-CM | POA: Diagnosis present

## 2018-05-24 DIAGNOSIS — Z8701 Personal history of pneumonia (recurrent): Secondary | ICD-10-CM

## 2018-05-24 DIAGNOSIS — I2510 Atherosclerotic heart disease of native coronary artery without angina pectoris: Secondary | ICD-10-CM | POA: Diagnosis present

## 2018-05-24 DIAGNOSIS — Z794 Long term (current) use of insulin: Secondary | ICD-10-CM

## 2018-05-24 DIAGNOSIS — E1122 Type 2 diabetes mellitus with diabetic chronic kidney disease: Secondary | ICD-10-CM | POA: Diagnosis present

## 2018-05-24 DIAGNOSIS — Z972 Presence of dental prosthetic device (complete) (partial): Secondary | ICD-10-CM

## 2018-05-24 DIAGNOSIS — I5042 Chronic combined systolic (congestive) and diastolic (congestive) heart failure: Secondary | ICD-10-CM | POA: Diagnosis present

## 2018-05-24 DIAGNOSIS — Z6839 Body mass index (BMI) 39.0-39.9, adult: Secondary | ICD-10-CM

## 2018-05-24 DIAGNOSIS — M1712 Unilateral primary osteoarthritis, left knee: Principal | ICD-10-CM | POA: Diagnosis present

## 2018-05-24 DIAGNOSIS — N529 Male erectile dysfunction, unspecified: Secondary | ICD-10-CM | POA: Diagnosis present

## 2018-05-24 DIAGNOSIS — F17210 Nicotine dependence, cigarettes, uncomplicated: Secondary | ICD-10-CM | POA: Diagnosis present

## 2018-05-24 DIAGNOSIS — G8929 Other chronic pain: Secondary | ICD-10-CM | POA: Diagnosis present

## 2018-05-24 DIAGNOSIS — G4730 Sleep apnea, unspecified: Secondary | ICD-10-CM | POA: Diagnosis present

## 2018-05-24 DIAGNOSIS — Z803 Family history of malignant neoplasm of breast: Secondary | ICD-10-CM

## 2018-05-24 DIAGNOSIS — Z955 Presence of coronary angioplasty implant and graft: Secondary | ICD-10-CM

## 2018-05-24 DIAGNOSIS — Z7982 Long term (current) use of aspirin: Secondary | ICD-10-CM

## 2018-05-24 DIAGNOSIS — N184 Chronic kidney disease, stage 4 (severe): Secondary | ICD-10-CM | POA: Diagnosis present

## 2018-05-24 DIAGNOSIS — Z9221 Personal history of antineoplastic chemotherapy: Secondary | ICD-10-CM

## 2018-05-24 DIAGNOSIS — I130 Hypertensive heart and chronic kidney disease with heart failure and stage 1 through stage 4 chronic kidney disease, or unspecified chronic kidney disease: Secondary | ICD-10-CM | POA: Diagnosis present

## 2018-05-24 DIAGNOSIS — I447 Left bundle-branch block, unspecified: Secondary | ICD-10-CM | POA: Diagnosis present

## 2018-05-24 DIAGNOSIS — E785 Hyperlipidemia, unspecified: Secondary | ICD-10-CM | POA: Diagnosis present

## 2018-05-24 DIAGNOSIS — M25762 Osteophyte, left knee: Secondary | ICD-10-CM | POA: Diagnosis present

## 2018-05-24 DIAGNOSIS — Z7951 Long term (current) use of inhaled steroids: Secondary | ICD-10-CM

## 2018-05-24 DIAGNOSIS — E039 Hypothyroidism, unspecified: Secondary | ICD-10-CM | POA: Diagnosis present

## 2018-05-24 DIAGNOSIS — Z800 Family history of malignant neoplasm of digestive organs: Secondary | ICD-10-CM

## 2018-05-24 DIAGNOSIS — Z801 Family history of malignant neoplasm of trachea, bronchus and lung: Secondary | ICD-10-CM

## 2018-05-24 DIAGNOSIS — Z8572 Personal history of non-Hodgkin lymphomas: Secondary | ICD-10-CM

## 2018-05-24 DIAGNOSIS — I252 Old myocardial infarction: Secondary | ICD-10-CM

## 2018-05-24 HISTORY — PX: TOTAL KNEE ARTHROPLASTY: SHX125

## 2018-05-24 LAB — GLUCOSE, CAPILLARY
GLUCOSE-CAPILLARY: 138 mg/dL — AB (ref 70–99)
GLUCOSE-CAPILLARY: 100 mg/dL — AB (ref 70–99)
Glucose-Capillary: 105 mg/dL — ABNORMAL HIGH (ref 70–99)
Glucose-Capillary: 137 mg/dL — ABNORMAL HIGH (ref 70–99)

## 2018-05-24 SURGERY — ARTHROPLASTY, KNEE, TOTAL
Anesthesia: Monitor Anesthesia Care | Site: Knee | Laterality: Left

## 2018-05-24 MED ORDER — ACETAMINOPHEN 500 MG PO TABS
500.0000 mg | ORAL_TABLET | Freq: Four times a day (QID) | ORAL | Status: AC
  Administered 2018-05-24 – 2018-05-25 (×3): 500 mg via ORAL
  Filled 2018-05-24 (×3): qty 1

## 2018-05-24 MED ORDER — LEVOTHYROXINE SODIUM 100 MCG PO TABS: 200.0000 ug | ORAL_TABLET | Freq: Every day | ORAL | Status: DC

## 2018-05-24 MED ORDER — ONDANSETRON HCL 4 MG/2ML IJ SOLN
INTRAMUSCULAR | Status: AC
  Filled 2018-05-24: qty 2

## 2018-05-24 MED ORDER — PROPOFOL 10 MG/ML IV BOLUS
INTRAVENOUS | Status: DC | PRN
  Administered 2018-05-24: 30 mg via INTRAVENOUS

## 2018-05-24 MED ORDER — PROPOFOL 500 MG/50ML IV EMUL
INTRAVENOUS | Status: DC | PRN
  Administered 2018-05-24: 50 ug/kg/min via INTRAVENOUS

## 2018-05-24 MED ORDER — BUPIVACAINE IN DEXTROSE 0.75-8.25 % IT SOLN
INTRATHECAL | Status: DC | PRN
  Administered 2018-05-24: 12 mg via INTRATHECAL

## 2018-05-24 MED ORDER — BUPIVACAINE LIPOSOME 1.3 % IJ SUSP
20.0000 mL | Freq: Once | INTRAMUSCULAR | Status: AC
  Administered 2018-05-24: 20 mL
  Filled 2018-05-24: qty 20

## 2018-05-24 MED ORDER — HYDROCHLOROTHIAZIDE 25 MG PO TABS
25.0000 mg | ORAL_TABLET | Freq: Every day | ORAL | Status: DC
  Administered 2018-05-24 – 2018-05-27 (×4): 25 mg via ORAL
  Filled 2018-05-24 (×4): qty 1

## 2018-05-24 MED ORDER — DIPHENHYDRAMINE HCL 12.5 MG/5ML PO ELIX
12.5000 mg | ORAL_SOLUTION | ORAL | Status: DC | PRN
  Administered 2018-05-25: 25 mg via ORAL
  Administered 2018-05-25: 12.5 mg via ORAL
  Administered 2018-05-26: 25 mg via ORAL
  Filled 2018-05-24 (×3): qty 10

## 2018-05-24 MED ORDER — HYDROCODONE-ACETAMINOPHEN 5-325 MG PO TABS
1.0000 | ORAL_TABLET | ORAL | Status: DC | PRN
  Administered 2018-05-24: 2 via ORAL
  Administered 2018-05-24 – 2018-05-25 (×2): 1 via ORAL
  Administered 2018-05-25 – 2018-05-27 (×3): 2 via ORAL
  Filled 2018-05-24: qty 1
  Filled 2018-05-24 (×2): qty 2
  Filled 2018-05-24: qty 1
  Filled 2018-05-24 (×2): qty 2

## 2018-05-24 MED ORDER — LEVOTHYROXINE SODIUM 100 MCG PO TABS
300.0000 ug | ORAL_TABLET | Freq: Every day | ORAL | Status: DC
  Administered 2018-05-25 – 2018-05-27 (×3): 300 ug via ORAL
  Filled 2018-05-24 (×3): qty 3

## 2018-05-24 MED ORDER — LORATADINE 10 MG PO TABS
5.0000 mg | ORAL_TABLET | Freq: Every evening | ORAL | Status: DC
  Filled 2018-05-24: qty 0.5

## 2018-05-24 MED ORDER — TRANEXAMIC ACID 1000 MG/10ML IV SOLN
2000.0000 mg | INTRAVENOUS | Status: AC
  Administered 2018-05-24: 2000 mg via TOPICAL
  Filled 2018-05-24: qty 20

## 2018-05-24 MED ORDER — ROPIVACAINE HCL 7.5 MG/ML IJ SOLN
INTRAMUSCULAR | Status: DC | PRN
  Administered 2018-05-24: 20 mL via PERINEURAL

## 2018-05-24 MED ORDER — FENTANYL CITRATE (PF) 100 MCG/2ML IJ SOLN
100.0000 ug | Freq: Once | INTRAMUSCULAR | Status: AC
  Administered 2018-05-24: 100 ug via INTRAVENOUS
  Filled 2018-05-24: qty 2

## 2018-05-24 MED ORDER — TRANEXAMIC ACID-NACL 1000-0.7 MG/100ML-% IV SOLN
1000.0000 mg | Freq: Once | INTRAVENOUS | Status: AC
  Administered 2018-05-24: 1000 mg via INTRAVENOUS
  Filled 2018-05-24: qty 100

## 2018-05-24 MED ORDER — ONDANSETRON HCL 4 MG/2ML IJ SOLN
INTRAMUSCULAR | Status: DC | PRN
  Administered 2018-05-24: 4 mg via INTRAVENOUS

## 2018-05-24 MED ORDER — LORATADINE 10 MG PO TABS
10.0000 mg | ORAL_TABLET | Freq: Every evening | ORAL | Status: DC
  Administered 2018-05-24 – 2018-05-27 (×4): 10 mg via ORAL
  Filled 2018-05-24 (×4): qty 1

## 2018-05-24 MED ORDER — METOCLOPRAMIDE HCL 5 MG/ML IJ SOLN: 5.0000 mg | Freq: Three times a day (TID) | INTRAMUSCULAR | Status: DC | PRN

## 2018-05-24 MED ORDER — 0.9 % SODIUM CHLORIDE (POUR BTL) OPTIME
TOPICAL | Status: DC | PRN
  Administered 2018-05-24: 1000 mL

## 2018-05-24 MED ORDER — ASPIRIN EC 325 MG PO TBEC
325.0000 mg | DELAYED_RELEASE_TABLET | Freq: Two times a day (BID) | ORAL | Status: DC
  Administered 2018-05-25 – 2018-05-27 (×6): 325 mg via ORAL
  Filled 2018-05-24 (×6): qty 1

## 2018-05-24 MED ORDER — GLYCOPYRROLATE 0.2 MG/ML IJ SOLN
INTRAMUSCULAR | Status: DC | PRN
  Administered 2018-05-24: 0.2 mg via INTRAVENOUS

## 2018-05-24 MED ORDER — CHLORHEXIDINE GLUCONATE 4 % EX LIQD: 60.0000 mL | Freq: Once | CUTANEOUS | Status: DC

## 2018-05-24 MED ORDER — FENTANYL CITRATE (PF) 250 MCG/5ML IJ SOLN
INTRAMUSCULAR | Status: AC
  Filled 2018-05-24: qty 5

## 2018-05-24 MED ORDER — BISACODYL 5 MG PO TBEC
5.0000 mg | DELAYED_RELEASE_TABLET | Freq: Every day | ORAL | Status: DC | PRN
  Administered 2018-05-27: 5 mg via ORAL
  Filled 2018-05-24: qty 1

## 2018-05-24 MED ORDER — ALBUMIN HUMAN 5 % IV SOLN
INTRAVENOUS | Status: AC
  Filled 2018-05-24: qty 250

## 2018-05-24 MED ORDER — BISOPROLOL FUMARATE 10 MG PO TABS
5.0000 mg | ORAL_TABLET | Freq: Every day | ORAL | Status: DC
  Administered 2018-05-26 – 2018-05-27 (×2): 5 mg via ORAL
  Filled 2018-05-24 (×3): qty 1

## 2018-05-24 MED ORDER — ONDANSETRON HCL 4 MG PO TABS: 4.0000 mg | ORAL_TABLET | Freq: Four times a day (QID) | ORAL | Status: DC | PRN

## 2018-05-24 MED ORDER — AMLODIPINE BESYLATE 10 MG PO TABS: 10.0000 mg | ORAL_TABLET | Freq: Every day | ORAL | Status: DC

## 2018-05-24 MED ORDER — METOCLOPRAMIDE HCL 5 MG PO TABS: 5.0000 mg | ORAL_TABLET | Freq: Three times a day (TID) | ORAL | Status: DC | PRN

## 2018-05-24 MED ORDER — INSULIN ASPART 100 UNIT/ML FLEXPEN: 4.0000 [IU] | PEN_INJECTOR | Freq: Every day | SUBCUTANEOUS | Status: DC | PRN

## 2018-05-24 MED ORDER — TRAZODONE HCL 50 MG PO TABS: 50.0000 mg | ORAL_TABLET | Freq: Every day | ORAL | Status: DC

## 2018-05-24 MED ORDER — SODIUM CHLORIDE 0.9 % IV SOLN
INTRAVENOUS | Status: DC | PRN
  Administered 2018-05-24: 25 ug/min via INTRAVENOUS

## 2018-05-24 MED ORDER — BUPIVACAINE-EPINEPHRINE (PF) 0.5% -1:200000 IJ SOLN
INTRAMUSCULAR | Status: DC | PRN
  Administered 2018-05-24: 30 mL via PERINEURAL

## 2018-05-24 MED ORDER — SIMVASTATIN 10 MG PO TABS
5.0000 mg | ORAL_TABLET | Freq: Every day | ORAL | Status: DC
  Administered 2018-05-24 – 2018-05-27 (×4): 5 mg via ORAL
  Filled 2018-05-24 (×4): qty 1

## 2018-05-24 MED ORDER — ACETAMINOPHEN 325 MG PO TABS
325.0000 mg | ORAL_TABLET | Freq: Four times a day (QID) | ORAL | Status: DC | PRN
  Administered 2018-05-26: 325 mg via ORAL
  Filled 2018-05-24: qty 1
  Filled 2018-05-24: qty 2
  Filled 2018-05-24: qty 1

## 2018-05-24 MED ORDER — LACTATED RINGERS IV SOLN
INTRAVENOUS | Status: DC
  Administered 2018-05-24 (×2): via INTRAVENOUS

## 2018-05-24 MED ORDER — TRAZODONE HCL 50 MG PO TABS
50.0000 mg | ORAL_TABLET | Freq: Every evening | ORAL | Status: DC | PRN
  Administered 2018-05-24: 50 mg via ORAL
  Filled 2018-05-24: qty 1

## 2018-05-24 MED ORDER — BUPIVACAINE-EPINEPHRINE 0.5% -1:200000 IJ SOLN
INTRAMUSCULAR | Status: AC
  Filled 2018-05-24: qty 1

## 2018-05-24 MED ORDER — CLOPIDOGREL BISULFATE 75 MG PO TABS
75.0000 mg | ORAL_TABLET | Freq: Every day | ORAL | Status: DC
  Administered 2018-05-25 – 2018-05-27 (×3): 75 mg via ORAL
  Filled 2018-05-24 (×3): qty 1

## 2018-05-24 MED ORDER — SODIUM CHLORIDE 0.9 % IR SOLN
Status: DC | PRN
  Administered 2018-05-24: 3000 mL

## 2018-05-24 MED ORDER — INSULIN DEGLUDEC 100 UNIT/ML ~~LOC~~ SOPN
30.0000 [IU] | PEN_INJECTOR | Freq: Every day | SUBCUTANEOUS | Status: DC
  Filled 2018-05-24: qty 3

## 2018-05-24 MED ORDER — ALBUMIN HUMAN 5 % IV SOLN: 12.5000 g | Freq: Once | INTRAVENOUS | Status: DC

## 2018-05-24 MED ORDER — MIDAZOLAM HCL 2 MG/2ML IJ SOLN
2.0000 mg | Freq: Once | INTRAMUSCULAR | Status: AC
  Administered 2018-05-24: 1 mg via INTRAVENOUS
  Filled 2018-05-24: qty 2

## 2018-05-24 MED ORDER — METHOCARBAMOL 1000 MG/10ML IJ SOLN
500.0000 mg | Freq: Four times a day (QID) | INTRAVENOUS | Status: DC | PRN
  Filled 2018-05-24: qty 5

## 2018-05-24 MED ORDER — MORPHINE SULFATE (PF) 2 MG/ML IV SOLN
0.5000 mg | INTRAVENOUS | Status: DC | PRN
  Administered 2018-05-24 – 2018-05-26 (×4): 1 mg via INTRAVENOUS
  Filled 2018-05-24 (×4): qty 1

## 2018-05-24 MED ORDER — INSULIN ASPART 100 UNIT/ML ~~LOC~~ SOLN
0.0000 [IU] | Freq: Three times a day (TID) | SUBCUTANEOUS | Status: DC
  Administered 2018-05-25 – 2018-05-27 (×5): 3 [IU] via SUBCUTANEOUS

## 2018-05-24 MED ORDER — MENTHOL 3 MG MT LOZG: 1.0000 | LOZENGE | OROMUCOSAL | Status: DC | PRN

## 2018-05-24 MED ORDER — MONTELUKAST SODIUM 10 MG PO TABS
10.0000 mg | ORAL_TABLET | Freq: Every day | ORAL | Status: DC
  Administered 2018-05-24 – 2018-05-27 (×4): 10 mg via ORAL
  Filled 2018-05-24 (×4): qty 1

## 2018-05-24 MED ORDER — HYDROCODONE-ACETAMINOPHEN 7.5-325 MG PO TABS
1.0000 | ORAL_TABLET | ORAL | Status: DC | PRN
  Administered 2018-05-25: 2 via ORAL
  Administered 2018-05-25: 1 via ORAL
  Administered 2018-05-26 – 2018-05-27 (×4): 2 via ORAL
  Filled 2018-05-24 (×7): qty 2

## 2018-05-24 MED ORDER — PHENOL 1.4 % MT LIQD: 1.0000 | OROMUCOSAL | Status: DC | PRN

## 2018-05-24 MED ORDER — TRANEXAMIC ACID 1000 MG/10ML IV SOLN
2000.0000 mg | Freq: Once | INTRAVENOUS | Status: DC
  Filled 2018-05-24: qty 20

## 2018-05-24 MED ORDER — LOSARTAN POTASSIUM 50 MG PO TABS
100.0000 mg | ORAL_TABLET | Freq: Every day | ORAL | Status: DC
  Administered 2018-05-24 – 2018-05-27 (×3): 100 mg via ORAL
  Filled 2018-05-24 (×4): qty 2

## 2018-05-24 MED ORDER — CEFAZOLIN SODIUM-DEXTROSE 2-4 GM/100ML-% IV SOLN
2.0000 g | Freq: Four times a day (QID) | INTRAVENOUS | Status: AC
  Administered 2018-05-24 – 2018-05-25 (×2): 2 g via INTRAVENOUS
  Filled 2018-05-24 (×2): qty 100

## 2018-05-24 MED ORDER — SODIUM CHLORIDE 0.9% FLUSH
INTRAVENOUS | Status: DC | PRN
  Administered 2018-05-24: 10 mL

## 2018-05-24 MED ORDER — MOMETASONE FURO-FORMOTEROL FUM 100-5 MCG/ACT IN AERO
2.0000 | INHALATION_SPRAY | Freq: Two times a day (BID) | RESPIRATORY_TRACT | Status: DC
  Administered 2018-05-26 – 2018-05-27 (×2): 2 via RESPIRATORY_TRACT
  Filled 2018-05-24: qty 8.8

## 2018-05-24 MED ORDER — KETOROLAC TROMETHAMINE 15 MG/ML IJ SOLN
7.5000 mg | Freq: Four times a day (QID) | INTRAMUSCULAR | Status: AC
  Administered 2018-05-24 – 2018-05-25 (×3): 7.5 mg via INTRAVENOUS
  Filled 2018-05-24 (×3): qty 1

## 2018-05-24 MED ORDER — FUROSEMIDE 40 MG PO TABS: 40.0000 mg | ORAL_TABLET | Freq: Every day | ORAL | Status: DC

## 2018-05-24 MED ORDER — MIDAZOLAM HCL 2 MG/2ML IJ SOLN
INTRAMUSCULAR | Status: AC
  Filled 2018-05-24: qty 2

## 2018-05-24 MED ORDER — ALBUTEROL SULFATE (2.5 MG/3ML) 0.083% IN NEBU: 2.5000 mg | INHALATION_SOLUTION | Freq: Four times a day (QID) | RESPIRATORY_TRACT | Status: DC | PRN

## 2018-05-24 MED ORDER — LACTATED RINGERS IV SOLN
INTRAVENOUS | Status: DC
  Administered 2018-05-24 (×2): via INTRAVENOUS

## 2018-05-24 MED ORDER — NITROGLYCERIN 0.4 MG SL SUBL: 0.4000 mg | SUBLINGUAL_TABLET | SUBLINGUAL | Status: DC | PRN

## 2018-05-24 MED ORDER — DOCUSATE SODIUM 100 MG PO CAPS
100.0000 mg | ORAL_CAPSULE | Freq: Two times a day (BID) | ORAL | Status: DC
  Administered 2018-05-24 – 2018-05-27 (×8): 100 mg via ORAL
  Filled 2018-05-24 (×8): qty 1

## 2018-05-24 MED ORDER — LOSARTAN POTASSIUM-HCTZ 100-25 MG PO TABS: 1.0000 | ORAL_TABLET | Freq: Every day | ORAL | Status: DC

## 2018-05-24 MED ORDER — INSULIN GLARGINE 100 UNIT/ML ~~LOC~~ SOLN
30.0000 [IU] | Freq: Every day | SUBCUTANEOUS | Status: DC
  Administered 2018-05-24 – 2018-05-27 (×4): 30 [IU] via SUBCUTANEOUS
  Filled 2018-05-24 (×4): qty 0.3

## 2018-05-24 MED ORDER — METHOCARBAMOL 500 MG PO TABS
500.0000 mg | ORAL_TABLET | Freq: Four times a day (QID) | ORAL | Status: DC | PRN
  Administered 2018-05-25 – 2018-05-27 (×5): 500 mg via ORAL
  Filled 2018-05-24 (×6): qty 1

## 2018-05-24 MED ORDER — ONDANSETRON HCL 4 MG/2ML IJ SOLN: 4.0000 mg | Freq: Four times a day (QID) | INTRAMUSCULAR | Status: DC | PRN

## 2018-05-24 MED ORDER — PROPOFOL 10 MG/ML IV BOLUS
INTRAVENOUS | Status: AC
  Filled 2018-05-24: qty 20

## 2018-05-24 MED ORDER — ALUM & MAG HYDROXIDE-SIMETH 200-200-20 MG/5ML PO SUSP: 30.0000 mL | ORAL | Status: DC | PRN

## 2018-05-24 SURGICAL SUPPLY — 58 items
BAG DECANTER FOR FLEXI CONT (MISCELLANEOUS) IMPLANT
BANDAGE ESMARK 6X9 LF (GAUZE/BANDAGES/DRESSINGS) ×1 IMPLANT
BLADE SAGITTAL 25.0X1.19X90 (BLADE) ×2 IMPLANT
BLADE SAGITTAL 25.0X1.19X90MM (BLADE) ×1
BLADE SAW SGTL 13.0X1.19X90.0M (BLADE) IMPLANT
BNDG ELASTIC 6X10 VLCR STRL LF (GAUZE/BANDAGES/DRESSINGS) ×3 IMPLANT
BNDG ESMARK 6X9 LF (GAUZE/BANDAGES/DRESSINGS) ×3
BOWL SMART MIX CTS (DISPOSABLE) ×3 IMPLANT
CEMENT HV SMART SET (Cement) ×6 IMPLANT
CLOSURE WOUND 1/2 X4 (GAUZE/BANDAGES/DRESSINGS) ×1
COMP FEM CEM LRG LT LCS (Orthopedic Implant) ×3 IMPLANT
COMPONENT FEM CEM LRG LT LCS (Orthopedic Implant) ×1 IMPLANT
COVER SURGICAL LIGHT HANDLE (MISCELLANEOUS) ×3 IMPLANT
COVER WAND RF STERILE (DRAPES) ×3 IMPLANT
CUFF TOURNIQUET SINGLE 34IN LL (TOURNIQUET CUFF) ×3 IMPLANT
CUFF TOURNIQUET SINGLE 44IN (TOURNIQUET CUFF) IMPLANT
DECANTER SPIKE VIAL GLASS SM (MISCELLANEOUS) ×3 IMPLANT
DRAPE EXTREMITY T 121X128X90 (DRAPE) ×3 IMPLANT
DRAPE HALF SHEET 40X57 (DRAPES) ×6 IMPLANT
DRAPE U-SHAPE 47X51 STRL (DRAPES) ×3 IMPLANT
DRSG AQUACEL AG ADV 3.5X10 (GAUZE/BANDAGES/DRESSINGS) ×3 IMPLANT
DURAPREP 26ML APPLICATOR (WOUND CARE) ×3 IMPLANT
ELECT REM PT RETURN 9FT ADLT (ELECTROSURGICAL) ×3
ELECTRODE REM PT RTRN 9FT ADLT (ELECTROSURGICAL) ×1 IMPLANT
GLOVE BIO SURGEON STRL SZ8 (GLOVE) ×6 IMPLANT
GLOVE BIOGEL PI IND STRL 8 (GLOVE) ×2 IMPLANT
GLOVE BIOGEL PI INDICATOR 8 (GLOVE) ×4
GLOVE SURG SS PI 8.0 STRL IVOR (GLOVE) ×6 IMPLANT
GOWN STRL REUS W/ TWL LRG LVL3 (GOWN DISPOSABLE) ×1 IMPLANT
GOWN STRL REUS W/ TWL XL LVL3 (GOWN DISPOSABLE) ×2 IMPLANT
GOWN STRL REUS W/TWL LRG LVL3 (GOWN DISPOSABLE) ×2
GOWN STRL REUS W/TWL XL LVL3 (GOWN DISPOSABLE) ×4
HANDPIECE INTERPULSE COAX TIP (DISPOSABLE) ×2
HOOD PEEL AWAY FACE SHEILD DIS (HOOD) ×6 IMPLANT
IMMOBILIZER KNEE 22 UNIV (SOFTGOODS) ×3 IMPLANT
INSERT TIB LCS RP LRG 10 (Knees) ×3 IMPLANT
KIT BASIN OR (CUSTOM PROCEDURE TRAY) ×3 IMPLANT
KIT TURNOVER KIT B (KITS) ×3 IMPLANT
MANIFOLD NEPTUNE II (INSTRUMENTS) ×3 IMPLANT
NEEDLE HYPO 21X1 ECLIPSE (NEEDLE) ×3 IMPLANT
NS IRRIG 1000ML POUR BTL (IV SOLUTION) ×3 IMPLANT
PACK TOTAL JOINT (CUSTOM PROCEDURE TRAY) ×3 IMPLANT
PAD ARMBOARD 7.5X6 YLW CONV (MISCELLANEOUS) ×6 IMPLANT
PATELLA DOME PFC 41MM (Knees) ×3 IMPLANT
PIN STEINMAN FIXATION KNEE (PIN) ×3 IMPLANT
SET HNDPC FAN SPRY TIP SCT (DISPOSABLE) ×1 IMPLANT
STRIP CLOSURE SKIN 1/2X4 (GAUZE/BANDAGES/DRESSINGS) ×2 IMPLANT
SUT VIC AB 0 CT1 27 (SUTURE) ×2
SUT VIC AB 0 CT1 27XBRD ANBCTR (SUTURE) ×1 IMPLANT
SUT VIC AB 2-0 CT1 27 (SUTURE) ×2
SUT VIC AB 2-0 CT1 TAPERPNT 27 (SUTURE) ×1 IMPLANT
SUT VIC AB 3-0 FS2 27 (SUTURE) ×3 IMPLANT
SUT VLOC 180 0 24IN GS25 (SUTURE) ×3 IMPLANT
SYR 50ML LL SCALE MARK (SYRINGE) ×3 IMPLANT
TOWEL OR 17X24 6PK STRL BLUE (TOWEL DISPOSABLE) ×3 IMPLANT
TOWEL OR 17X26 10 PK STRL BLUE (TOWEL DISPOSABLE) ×3 IMPLANT
TRAY CATH 16FR W/PLASTIC CATH (SET/KITS/TRAYS/PACK) IMPLANT
TRAY TIB SZ 5 REVISION (Knees) ×3 IMPLANT

## 2018-05-24 NOTE — Anesthesia Procedure Notes (Signed)
Spinal  Patient location during procedure: OR Start time: 05/24/2018 10:52 AM End time: 05/24/2018 11:03 AM Staffing Anesthesiologist: Duane Boston, MD Performed: anesthesiologist  Preanesthetic Checklist Completed: patient identified, surgical consent, pre-op evaluation, timeout performed, IV checked, risks and benefits discussed and monitors and equipment checked Spinal Block Patient position: sitting Prep: DuraPrep Patient monitoring: cardiac monitor, continuous pulse ox and blood pressure Approach: midline Location: L3-4 Injection technique: single-shot Needle Needle type: Pencan  Needle gauge: 24 G Needle length: 9 cm Additional Notes Functioning IV was confirmed and monitors were applied. Sterile prep and drape, including hand hygiene and sterile gloves were used. The patient was positioned and the spine was prepped. The skin was anesthetized with lidocaine.  Free flow of clear CSF was obtained prior to injecting local anesthetic into the CSF.  The spinal needle aspirated freely following injection.  The needle was carefully withdrawn.  The patient tolerated the procedure well.

## 2018-05-24 NOTE — Op Note (Signed)
PREOP DIAGNOSIS: DJD LEFT KNEE POSTOP DIAGNOSIS:  same PROCEDURE: LEFT TKR ANESTHESIA: Spinal and MAC ATTENDING SURGEON: Copeland Lapier G ASSISTANT: Loni Dolly PA  INDICATIONS FOR PROCEDURE: [name] [name] is a 72 y.o. male who has struggled for a long time with pain due to degenerative arthritis of the left knee.  The patient has failed many conservative non-operative measures and at this point has pain which limits the ability to sleep and walk.  The patient is offered total knee replacement.  Informed operative consent was obtained after discussion of possible risks of anesthesia, infection, neurovascular injury, DVT, and death.  The importance of the post-operative rehabilitation protocol to optimize result was stressed extensively with the patient.  SUMMARY OF FINDINGS AND PROCEDURE:  [name] [name] was taken to the operative suite where under the above anesthesia a left knee replacement was performed.  There were advanced degenerative changes and the bone quality was excellent.  We used the DePuyLCS system and placed size large femur, 5MBT revision tibia, 41 mm all polyethylene patella, and a size 10 mm spacer.  Loni Dolly PA-C assisted throughout and was invaluable to the completion of the case in that he helped retract and maintain exposure while I placed the components.  He also helped close thereby minimizing OR time.  The patient was admitted for appropriate post-op care to include perioperative antibiotics and mechanical and pharmacologic measures for DVT prophylaxis.  DESCRIPTION OF PROCEDURE:  [name] [name] was taken to the operative suite where the above anesthesia was applied.  The patient was positioned supine and prepped and draped in normal sterile fashion.  An appropriate time out was performed.  After the administration of kefzol pre-op antibiotic the leg was elevated and exsanguinated and a tourniquet inflated.  A standard longitudinal incision was made on the anterior knee.   Dissection was carried down to the extensor mechanism.  All appropriate anti-infective measures were used including the pre-operative antibiotic, betadine impregnated drape, and closed hooded exhaust systems for each member of the surgical team.  A medial parapatellar incision was made in the extensor mechanism and the knee cap flipped and the knee flexed.  Some residual meniscal tissues were removed along with any remaining ACL/PCL tissue.  A guide was placed on the tibia and a flat cut was made on it's superior surface.  An intramedullary guide was placed in the femur and was utilized to make anterior and posterior cuts creating an appropriate flexion gap.  A second intramedullary guide was placed in the femur to make a distal cut properly balancing the knee with an extension gap equal to the flexion gap.  The three bones sized to the above mentioned sizes and the appropriate guides were placed and utilized.  A trial reduction was done and the knee easily came to full extension and the patella tracked well on flexion.  The trial components were removed and all bones were cleaned with pulsatile lavage and then dried thoroughly.  Cement was mixed and was pressurized onto the bones followed by placement of the aforementioned components.  Excess cement was trimmed and pressure was held on the components until the cement had hardened.  The tourniquet was deflated and a small amount of bleeding was controlled with cautery and pressure.  The knee was irrigated thoroughly.  The extensor mechanism was re-approximated with V-loc suture in running fashion.  The knee was flexed and the repair was solid.  The subcutaneous tissues were re-approximated with #0 and #2-0 vicryl and the skin  closed with a subcuticular stitch and steristrips.  A sterile dressing was applied.  Intraoperative fluids, EBL, and tourniquet time can be obtained from anesthesia records.  DISPOSITION:  The patient was taken to recovery room in stable  condition and admitted for appropriate post-op care to include peri-operative antibiotic and DVT prophylaxis with mechanical and pharmacologic measures.  Mohsin Crum G 05/24/2018, 12:27 PM

## 2018-05-24 NOTE — Progress Notes (Signed)
Patient requesting for sleeping pill. MD paged

## 2018-05-24 NOTE — Evaluation (Signed)
Physical Therapy Evaluation Patient Details Name: [name] [name] MRN: [id number] DOB: April 25, 1946 Today's Date: 05/24/2018   History of Present Illness  Pt is a 72 y/o male s/p elective L TKA. PMH includes CAD s/p stent placement, NSTEMI, DM, CKD, and nonhodgkin's lymphoma.   Clinical Impression  Pt is s/p surgery above with deficits below. Pt with very poor activity tolerance secondary to pain and only able to walk a few feet from EOB before having to return to bed. Pt refusing to sit in chair this session. Educated about knee precautions and supine HEP. At end of session, noted HR to range from 38-41 bpm; pt asymptomatic. Notified RN, and RN reports that is baseline. Will continue to follow acutely to maximize functional mobility independence and safety.     Follow Up Recommendations Follow surgeon's recommendation for DC plan and follow-up therapies;Supervision for mobility/OOB    Equipment Recommendations  None recommended by PT    Recommendations for Other Services OT consult     Precautions / Restrictions Precautions Precautions: Fall;Knee Precaution Booklet Issued: Yes (comment) Precaution Comments: Reviewed knee precautions and supine HEP. PT with very limited tolerance.  Restrictions Weight Bearing Restrictions: Yes LLE Weight Bearing: Weight bearing as tolerated      Mobility  Bed Mobility Overal bed mobility: Needs Assistance Bed Mobility: Supine to Sit;Sit to Supine     Supine to sit: Min assist Sit to supine: Min assist   General bed mobility comments: Min A for LLE assist. Pt refusing to sit in the chair at end of session, therefore returned to supine.   Transfers Overall transfer level: Needs assistance Equipment used: Rolling walker (2 wheeled) Transfers: Sit to/from Stand Sit to Stand: Min assist;From elevated surface         General transfer comment: Min A for lift assist and steadying. Pt requiring increased time and effort to perform secondary to  pain.   Ambulation/Gait Ambulation/Gait assistance: Min assist Gait Distance (Feet): 2 Feet Assistive device: Rolling walker (2 wheeled) Gait Pattern/deviations: Step-to pattern;Decreased step length - right;Decreased step length - left;Decreased weight shift to left;Antalgic Gait velocity: Decreased   General Gait Details: Slow, antalgic gait. Pt only able to take a few steps away from EOB before reporting he had to return to bed. Verbal cues for sequencing using RW. Limited weightshift to LLE.   Stairs            Wheelchair Mobility    Modified Rankin (Stroke Patients Only)       Balance Overall balance assessment: Needs assistance Sitting-balance support: No upper extremity supported;Feet supported Sitting balance-Leahy Scale: Fair     Standing balance support: Bilateral upper extremity supported;During functional activity Standing balance-Leahy Scale: Poor Standing balance comment: Reliant on BUE support.                              Pertinent Vitals/Pain Pain Assessment: 0-10 Pain Score: 8  Pain Location: L knee  Pain Descriptors / Indicators: Aching;Operative site guarding Pain Intervention(s): Limited activity within patient's tolerance;Monitored during session;Repositioned    Home Living Family/patient expects to be discharged to:: Private residence Living Arrangements: Spouse/significant other Available Help at Discharge: Family;Available 24 hours/day Type of Home: House Home Access: Ramped entrance     Home Layout: One level Home Equipment: Walker - 2 wheels;Bedside commode;Cane - single point      Prior Function Level of Independence: Independent with assistive device(s)  Comments: Was using cane for ambulation      Hand Dominance        Extremity/Trunk Assessment   Upper Extremity Assessment Upper Extremity Assessment: Defer to OT evaluation    Lower Extremity Assessment Lower Extremity Assessment: LLE  deficits/detail LLE Deficits / Details: Deficits consistent with post op pain and weakness. Pt with limited tolerance for ther ex secondary to pain.     Cervical / Trunk Assessment Cervical / Trunk Assessment: Normal  Communication   Communication: No difficulties  Cognition Arousal/Alertness: Awake/alert Behavior During Therapy: WFL for tasks assessed/performed Overall Cognitive Status: Within Functional Limits for tasks assessed                                        General Comments General comments (skin integrity, edema, etc.): Pt's HR ranging from 38-40 bpm at end of session. Notified RN and RN reporting that is baseline for pt. Pt asymptomatic throughout.     Exercises Total Joint Exercises Ankle Circles/Pumps: AROM;Both;20 reps Heel Slides: AAROM;Left;5 reps   Assessment/Plan    PT Assessment Patient needs continued PT services  PT Problem List Decreased strength;Decreased range of motion;Decreased activity tolerance;Decreased balance;Decreased mobility;Decreased knowledge of use of DME;Decreased knowledge of precautions;Pain       PT Treatment Interventions DME instruction;Functional mobility training;Gait training;Therapeutic activities;Therapeutic exercise;Balance training;Patient/family education    PT Goals (Current goals can be found in the Care Plan section)  Acute Rehab PT Goals Patient Stated Goal: "for the pain to get better"  PT Goal Formulation: With patient Time For Goal Achievement: 06/07/18 Potential to Achieve Goals: Good    Frequency 7X/week   Barriers to discharge        Co-evaluation               AM-PAC PT "6 Clicks" Daily Activity  Outcome Measure Difficulty turning over in bed (including adjusting bedclothes, sheets and blankets)?: A Lot Difficulty moving from lying on back to sitting on the side of the bed? : Unable Difficulty sitting down on and standing up from a chair with arms (e.g., wheelchair, bedside  commode, etc,.)?: Unable Help needed moving to and from a bed to chair (including a wheelchair)?: A Little Help needed walking in hospital room?: A Little Help needed climbing 3-5 steps with a railing? : A Lot 6 Click Score: 12    End of Session Equipment Utilized During Treatment: Gait belt Activity Tolerance: Patient limited by pain Patient left: in bed;with call bell/phone within reach;with family/visitor present Nurse Communication: Mobility status;Other (comment)(pt with low HR ) PT Visit Diagnosis: Other abnormalities of gait and mobility (R26.89);Pain Pain - Right/Left: Left Pain - part of body: Knee    Time: 9169-4503 PT Time Calculation (min) (ACUTE ONLY): 29 min   Charges:   PT Evaluation $PT Eval Low Complexity: 1 Low PT Treatments $Therapeutic Activity: 8-22 mins        Leighton Ruff, PT, DPT  Acute Rehabilitation Services  Pager: [phone] Office: [phone]   Rudean Hitt 05/24/2018, 5:00 PM

## 2018-05-24 NOTE — Plan of Care (Signed)

## 2018-05-24 NOTE — Transfer of Care (Signed)
Immediate Anesthesia Transfer of Care Note  Patient: [name] [name]  Procedure(s) Performed: LEFT TOTAL KNEE ARTHROPLASTY (Left Knee)  Patient Location: PACU  Anesthesia Type:MAC and Spinal  Level of Consciousness: drowsy  Airway & Oxygen Therapy: Patient Spontanous Breathing and Patient connected to nasal cannula oxygen  Post-op Assessment: Report given to RN  Post vital signs: Reviewed and stable  Last Vitals:  Vitals Value Taken Time  BP 88/53 05/24/2018  1:04 PM  Temp    Pulse 45 05/24/2018  1:05 PM  Resp 17 05/24/2018  1:05 PM  SpO2 93 % 05/24/2018  1:05 PM  Vitals shown include unvalidated device data.  Last Pain:  Vitals:   05/24/18 0934  TempSrc: Oral  PainSc: 0-No pain      Patients Stated Pain Goal: 4 (48/54/62 7035)  Complications: No apparent anesthesia complications

## 2018-05-24 NOTE — Anesthesia Procedure Notes (Signed)
Anesthesia Regional Block: Adductor canal block   Pre-Anesthetic Checklist: ,, timeout performed, Correct Patient, Correct Site, Correct Laterality, Correct Procedure, Correct Position, site marked, Risks and benefits discussed,  Surgical consent,  Pre-op evaluation,  At surgeon's request and post-op pain management  Laterality: Left  Prep: chloraprep       Needles:  Injection technique: Single-shot  Needle Type: Stimulator Needle - 80     Needle Length: 10cm  Needle Gauge: 21     Additional Needles:   Narrative:  Start time: 05/24/2018 10:21 AM End time: 05/24/2018 10:31 AM Injection made incrementally with aspirations every 5 mL.  Performed by: Personally

## 2018-05-24 NOTE — Anesthesia Postprocedure Evaluation (Signed)
Anesthesia Post Note  Patient: [name] [name]  Procedure(s) Performed: LEFT TOTAL KNEE ARTHROPLASTY (Left Knee)     Patient location during evaluation: PACU Anesthesia Type: MAC, Epidural and Spinal Level of consciousness: awake and alert Pain management: pain level controlled Vital Signs Assessment: post-procedure vital signs reviewed and stable Respiratory status: spontaneous breathing and respiratory function stable Cardiovascular status: blood pressure returned to baseline and stable Postop Assessment: spinal receding Anesthetic complications: no    Last Vitals:  Vitals:   05/24/18 1405 05/24/18 1456  BP: 108/62 140/78  Pulse: (!) 40 (!) 43  Resp: 12 12  Temp: (!) 36.1 C   SpO2: 98% 98%    Last Pain:  Vitals:   05/24/18 1446  TempSrc:   PainSc: [place]

## 2018-05-24 NOTE — Anesthesia Preprocedure Evaluation (Addendum)
Anesthesia Evaluation  Patient identified by MRN, date of birth, ID band Patient awake    Reviewed: Allergy & Precautions, H&P , NPO status , Patient's Chart, lab work & pertinent test results  History of Anesthesia Complications Negative for: history of anesthetic complications  Airway Mallampati: II  TM Distance: >3 FB Neck ROM: Full    Dental  (+) Edentulous Upper, Dental Advisory Given   Pulmonary neg pulmonary ROS, shortness of breath, sleep apnea , pneumonia, Current Smoker,    Pulmonary exam normal breath sounds clear to auscultation       Cardiovascular hypertension, + CAD, + Past MI, + Cardiac Stents and + Peripheral Vascular Disease  Normal cardiovascular exam Rhythm:Regular Rate:Normal  Study Conclusions  - Left ventricle: The cavity size was mildly to moderately dilated.   Wall thickness was increased in a pattern of moderate LVH.   Systolic function was normal. The estimated ejection fraction was   in the range of 50% to 55%. Features are consistent with a   pseudonormal left ventricular filling pattern, with concomitant   abnormal relaxation and increased filling pressure (grade 2   diastolic dysfunction). Doppler parameters are consistent with   high ventricular filling pressure.   Neuro/Psych negative neurological ROS  negative psych ROS   GI/Hepatic negative GI ROS, Neg liver ROS,   Endo/Other  negative endocrine ROSdiabetes, Type 2, Insulin Dependent, Oral Hypoglycemic AgentsHypothyroidism Morbid obesity  Renal/GU Renal InsufficiencyRenal diseasenegative Renal ROS  negative genitourinary   Musculoskeletal negative musculoskeletal ROS (+)   Abdominal   Peds  Hematology negative hematology ROS (+)   Anesthesia Other Findings   Reproductive/Obstetrics negative OB ROS                             Anesthesia Physical  Anesthesia Plan  ASA: III  Anesthesia Plan: Spinal  and MAC   Post-op Pain Management:    Induction:   PONV Risk Score and Plan: 2 and Ondansetron and Propofol infusion  Airway Management Planned: Simple Face Mask  Additional Equipment:   Intra-op Plan:   Post-operative Plan:   Informed Consent: I have reviewed the patients History and Physical, chart, labs and discussed the procedure including the risks, benefits and alternatives for the proposed anesthesia with the patient or authorized representative who has indicated his/her understanding and acceptance.   Dental advisory given  Plan Discussed with: CRNA and Anesthesiologist  Anesthesia Plan Comments:        Anesthesia Quick Evaluation

## 2018-05-24 NOTE — Progress Notes (Signed)
Albumin bolus administered by Enid Derry, RN. Blood pressure improved. Dr. Tobias Alexander made aware.

## 2018-05-24 NOTE — Interval H&P Note (Signed)
History and Physical Interval Note:  05/24/2018 9:00 AM  [name] [name]  has presented today for surgery, with the diagnosis of LEFT KNEE DEGENERATIVE JOINT DISEASE  The various methods of treatment have been discussed with the patient and family. After consideration of risks, benefits and other options for treatment, the patient has consented to  Procedure(s): LEFT TOTAL KNEE ARTHROPLASTY (Left) as a surgical intervention .  The patient's history has been reviewed, patient examined, no change in status, stable for surgery.  I have reviewed the patient's chart and labs.  Questions were answered to the patient's satisfaction.     [name]

## 2018-05-24 NOTE — Anesthesia Procedure Notes (Signed)
Procedure Name: MAC Date/Time: 05/24/2018 10:56 AM Performed by: Barrington Ellison, CRNA Pre-anesthesia Checklist: Patient identified, Emergency Drugs available, Suction available, Patient being monitored and Timeout performed Patient Re-evaluated:Patient Re-evaluated prior to induction Oxygen Delivery Method: Simple face mask

## 2018-05-25 ENCOUNTER — Encounter (HOSPITAL_COMMUNITY): Payer: Self-pay | Admitting: Orthopaedic Surgery

## 2018-05-25 LAB — GLUCOSE, CAPILLARY
GLUCOSE-CAPILLARY: 140 mg/dL — AB (ref 70–99)
GLUCOSE-CAPILLARY: 166 mg/dL — AB (ref 70–99)
Glucose-Capillary: 151 mg/dL — ABNORMAL HIGH (ref 70–99)
Glucose-Capillary: 129 mg/dL — ABNORMAL HIGH (ref 70–99)
Glucose-Capillary: 122 mg/dL — ABNORMAL HIGH (ref 70–99)

## 2018-05-25 MED ORDER — LACTATED RINGERS IV BOLUS
1000.0000 mL | Freq: Once | INTRAVENOUS | Status: AC
  Administered 2018-05-25: 1000 mL via INTRAVENOUS

## 2018-05-25 NOTE — Progress Notes (Signed)
Physical Therapy Treatment Patient Details Name: [name] [name] MRN: [id number] DOB: 07/28/1946 Today's Date: 05/25/2018    History of Present Illness Pt is a 72 y/o male s/p elective L TKA. PMH includes CAD s/p stent placement, NSTEMI, DM, CKD, and nonhodgkin's lymphoma.     PT Comments    Continuing work on functional mobility and activity tolerance;  L knee pain significant today, only limited tolerance of therex; Extremely slow moving; In standing, pt reported feeling weak and requested to sit urgently (chair was pushed behind him for safety); BP in sitting in recliner was 98/61 -- I wonder if it was lower in standing; will consider orthostatics next session  Follow Up Recommendations  Follow surgeon's recommendation for DC plan and follow-up therapies;Supervision for mobility/OOB     Equipment Recommendations  None recommended by PT    Recommendations for Other Services       Precautions / Restrictions Precautions Precautions: Fall;Knee Precaution Booklet Issued: Yes (comment) Precaution Comments: Reviewed knee precautions. PT with very limited tolerance.  Restrictions Weight Bearing Restrictions: Yes LLE Weight Bearing: Weight bearing as tolerated    Mobility  Bed Mobility Overal bed mobility: Needs Assistance Bed Mobility: Supine to Sit     Supine to sit: Mod assist Sit to supine: Mod assist   General bed mobility comments: Cues for technqiue; Mod handheld assist to pull to sit once feet were free from the edge  Transfers Overall transfer level: Needs assistance Equipment used: Rolling walker (2 wheeled) Transfers: Sit to/from Stand Sit to Stand: From elevated surface;Mod assist;+2 physical assistance Stand pivot transfers: Min assist;+2 safety/equipment       General transfer comment: Extra time and encouragement for pt to particiapte in teh transfer to stand; he tends to repeat, " Im not ready"; 2 person assist to power up to sit, scooted to be close  enought to the bedrails for pt to push up on that  Ambulation/Gait Ambulation/Gait assistance: Min assist;+2 safety/equipment Gait Distance (Feet): 5 Feet Assistive device: Rolling walker (2 wheeled) Gait Pattern/deviations: Step-to pattern;Decreased step length - right;Decreased step length - left;Decreased weight shift to left;Antalgic Gait velocity: Decreased   General Gait Details: Slow, antalgic gait. Started with sidesteps at EOB, then tried forward steps; Pt only able to take a few steps away from EOB before reporting he had to return to bed. Verbal cues for sequencing using RW. Limited weightshift to LLE; He reported feeling "weak" in standing and requested to sit; did not get an opportunity to take standing BP   Stairs             Wheelchair Mobility    Modified Rankin (Stroke Patients Only)       Balance Overall balance assessment: Needs assistance Sitting-balance support: Feet supported;Single extremity supported;Bilateral upper extremity supported Sitting balance-Leahy Scale: Fair Sitting balance - Comments: reporting feeling "weak"    Standing balance support: Bilateral upper extremity supported;During functional activity Standing balance-Leahy Scale: Poor Standing balance comment: Reliant on BUE support.                             Cognition Arousal/Alertness: Awake/alert Behavior During Therapy: WFL for tasks assessed/performed Overall Cognitive Status: Within Functional Limits for tasks assessed                                        Exercises Total Joint  Exercises Ankle Circles/Pumps: AROM;Both;20 reps Quad Sets: AROM;Left;10 reps Heel Slides: AAROM;Left;5 reps Straight Leg Raises: AAROM;Left;10 reps    General Comments General comments (skin integrity, edema, etc.): BP 98/61 seated in recliner post standing activity      Pertinent Vitals/Pain Pain Assessment: Faces Faces Pain Scale: Hurts whole lot Pain Location:  L knee  Pain Descriptors / Indicators: Aching;Operative site guarding Pain Intervention(s): Premedicated before session    Home Living Family/patient expects to be discharged to:: Private residence Living Arrangements: Spouse/significant other Available Help at Discharge: Family;Available 24 hours/day Type of Home: House Home Access: Ramped entrance   Home Layout: One level Home Equipment: Walker - 2 wheels;Bedside commode;Cane - single point      Prior Function Level of Independence: Independent with assistive device(s)      Comments: Was using cane for ambulation    PT Goals (current goals can now be found in the care plan section) Acute Rehab PT Goals Patient Stated Goal: "for the pain to get better"  PT Goal Formulation: With patient Time For Goal Achievement: 06/07/18 Potential to Achieve Goals: Good Progress towards PT goals: Progressing toward goals(slowly)    Frequency    7X/week      PT Plan Current plan remains appropriate;Other (comment)(Slow progress, may need another day inpatient)    Co-evaluation   Reason for Co-Treatment: For patient/therapist safety;To address functional/ADL transfers   OT goals addressed during session: ADL's and self-care      AM-PAC PT "6 Clicks" Daily Activity  Outcome Measure  Difficulty turning over in bed (including adjusting bedclothes, sheets and blankets)?: A Lot Difficulty moving from lying on back to sitting on the side of the bed? : Unable Difficulty sitting down on and standing up from a chair with arms (e.g., wheelchair, bedside commode, etc,.)?: Unable Help needed moving to and from a bed to chair (including a wheelchair)?: A Little Help needed walking in hospital room?: A Lot Help needed climbing 3-5 steps with a railing? : A Lot 6 Click Score: 11    End of Session Equipment Utilized During Treatment: Gait belt Activity Tolerance: Patient limited by pain Patient left: in chair;with call bell/phone within  reach;with family/visitor present Nurse Communication: Mobility status;Other (comment)(Concern over low BP, decr activity tolerance) PT Visit Diagnosis: Other abnormalities of gait and mobility (R26.89);Pain Pain - Right/Left: Left Pain - part of body: Knee     Time: 1113-1155(End time is approximate) PT Time Calculation (min) (ACUTE ONLY): 42 min  Charges:  $Gait Training: 8-22 mins $Therapeutic Exercise: 8-22 mins $Therapeutic Activity: 8-22 mins                     Roney Marion, PT  Acute Rehabilitation Services Pager [phone] Office [place] 05/25/2018, 4:22 PM

## 2018-05-25 NOTE — Progress Notes (Signed)
Pt still has not voided after PO and IV fluids. Abd is soft and non-distended. Non-tender. Ballder scan shows 142ml and no urine returned with in and out cath. MD paged.

## 2018-05-25 NOTE — Plan of Care (Signed)
?  Problem: Education: ?Goal: Knowledge of General Education information will improve ?Description: Including pain rating scale, medication(s)/side effects and non-pharmacologic comfort measures ?Outcome: Progressing ?  ?Problem: Health Behavior/Discharge Planning: ?Goal: Ability to manage health-related needs will improve ?Outcome: Progressing ?  ?Problem: Coping: ?Goal: Level of anxiety will decrease ?Outcome: Progressing ?  ?

## 2018-05-25 NOTE — Progress Notes (Signed)
Physical Therapy Treatment Note  Clinical Impression: Continuing work on functional mobility and activity tolerance;  Pain continues to limit progress, and I have concerns about orthostatic hypotension -- [name] [name] was unable to tolerate standing long enough to get a standing BP, and the BP we obtained in sitting after standing had a drop of greater than 20 mmHg compared to his semi-supine position; also noted incr in HR by 20 bpm between semi-reclined position and standing; See vitals flow sheet.    05/25/18 1700  PT Visit Information  Last PT Received On 05/25/18  Assistance Needed +2  PT/OT/SLP Co-Evaluation/Treatment Yes  Reason for Co-Treatment For patient/therapist safety  PT goals addressed during session Mobility/safety with mobility  OT goals addressed during session ADL's and self-care  History of Present Illness Pt is a 72 y/o male s/p elective L TKA. PMH includes CAD s/p stent placement, NSTEMI, DM, CKD, and nonhodgkin's lymphoma.   Subjective Data  Patient Stated Goal "for the pain to get better"   Precautions  Precautions Fall;Knee  Precaution Comments Reviewed knee precautions. PT with very limited tolerance.   Restrictions  Weight Bearing Restrictions Yes  LLE Weight Bearing WBAT  Pain Assessment  Faces Pain Scale 8  Pain Location L knee   Pain Descriptors / Indicators Aching;Operative site guarding  Cognition  Arousal/Alertness Awake/alert  Behavior During Therapy WFL for tasks assessed/performed  Overall Cognitive Status Within Functional Limits for tasks assessed  Bed Mobility  Overal bed mobility Needs Assistance  Bed Mobility Sit to Supine  Sit to supine Mod assist  General bed mobility comments Assist to advance LLE.   Transfers  Overall transfer level Needs assistance  Equipment used Rolling walker (2 wheeled)  Transfers Sit to/from [facility]  Sit to Stand From elevated surface;Mod assist;+2 physical assistance  Stand pivot transfers  Min assist;+2 safety/equipment  General transfer comment mod A +2  to powerup. utilized pad underneath pt to facilitate power up. Cues for technique with rw. Feeling "weak" in standing position, requested to sit down before bp fully assessed. Assist to control descent.   Balance  Overall balance assessment Needs assistance  Sitting-balance support Feet supported;Single extremity supported;Bilateral upper extremity supported  Sitting balance-Leahy Scale Fair  Sitting balance - Comments reporting feeling "weak"   Standing balance support Bilateral upper extremity supported;During functional activity  Standing balance-Leahy Scale Poor  Standing balance comment Reliant on BUE support.   General Comments  General comments (skin integrity, edema, etc.) See Doc Flowhessts for Serial BPs  PT - End of Session  Equipment Utilized During Treatment Gait belt  Activity Tolerance Patient limited by pain  Patient left in bed;with call bell/phone within reach;with family/visitor present  Nurse Communication Mobility status   PT - Assessment/Plan  PT Plan Current plan remains appropriate;Other (comment) (Slow progress, may need another day inpatient)  PT Visit Diagnosis Other abnormalities of gait and mobility (R26.89);Pain  Pain - Right/Left Left  Pain - part of body Knee  PT Frequency (ACUTE ONLY) 7X/week  Follow Up Recommendations Follow surgeon's recommendation for DC plan and follow-up therapies;Supervision for mobility/OOB  PT equipment None recommended by PT  AM-PAC PT "6 Clicks" Daily Activity Outcome Measure  Difficulty turning over in bed (including adjusting bedclothes, sheets and blankets)? 2  Difficulty moving from lying on back to sitting on the side of the bed?  1  Difficulty sitting down on and standing up from a chair with arms (e.g., wheelchair, bedside commode, etc,.)? 1  Help needed moving to  and from a bed to chair (including a wheelchair)? 3  Help needed walking in hospital room?  2  Help needed climbing 3-5 steps with a railing?  2  6 Click Score 11  Mobility G Code  CL  PT Goal Progression  Progress towards PT goals Progressing toward goals (.slowly)  Acute Rehab PT Goals  PT Goal Formulation With patient  Time For Goal Achievement 06/07/18  Potential to Achieve Goals Good  PT Time Calculation  PT Start Time (ACUTE ONLY) 1337  PT Stop Time (ACUTE ONLY) 1407  PT Time Calculation (min) (ACUTE ONLY) 30 min  PT General Charges  $$ ACUTE PT VISIT 1 Visit  PT Treatments  $Therapeutic Activity 8-22 mins    Roney Marion, PT  Acute Rehabilitation Services Pager [phone] Office [phone]

## 2018-05-25 NOTE — Progress Notes (Signed)
Spoke with PA concerning urine status. Encouraged PO fluids and new orders obtained to check labs in a.m. Will continue to monitor.

## 2018-05-25 NOTE — Progress Notes (Signed)
Subjective: 1 Day Post-Op Procedure(s) (LRB): LEFT TOTAL KNEE ARTHROPLASTY (Left)   Patinet states he did not get much sleep last night. His pain is better controleld now but yesterday was quite painful.  Activity level:  wbat Diet tolerance:  ok Voiding:  ok Patient reports pain as mild and moderate.    Objective: Vital signs in last 24 hours: Temp:  [97 F (36.1 C)-98.4 F (36.9 C)] 98.2 F (36.8 C) (10/23 0417) Pulse Rate:  [40-85] 85 (10/23 0417) Resp:  [10-20] 20 (10/23 0417) BP: (83-140)/(47-78) 105/55 (10/23 0417) SpO2:  [91 %-100 %] 91 % (10/23 0417) Weight:  [134.7 kg] 134.7 kg (10/22 0934)  Labs: No results for input(s): HGB in the last 72 hours. No results for input(s): WBC, RBC, HCT, PLT in the last 72 hours. No results for input(s): NA, K, CL, CO2, BUN, CREATININE, GLUCOSE, CALCIUM in the last 72 hours. No results for input(s): LABPT, INR in the last 72 hours.  Physical Exam:  Neurologically intact ABD soft Neurovascular intact Sensation intact distally Intact pulses distally Dorsiflexion/Plantar flexion intact Incision: dressing C/D/I and no drainage No cellulitis present Compartment soft  Assessment/Plan:  1 Day Post-Op Procedure(s) (LRB): LEFT TOTAL KNEE ARTHROPLASTY (Left) Advance diet Up with therapy Discharge home with home health likely tomorrow unless patient does great and wishes to go home today. Continue on ASA 325mg  BID and baseline plavix for DVT prevention. Follow up in office 2 weeks post op. Anticipated LOS equal to or greater than 2 midnights due to - Age 71 and older with one or more of the following:  - Obesity  - Expected need for hospital services (PT, OT, Nursing) required for safe  discharge  - Anticipated need for postoperative skilled nursing care or inpatient rehab  - Active co-morbidities: Diabetes and Coronary Artery Disease OR   - Unanticipated findings during/Post Surgery: Slow post-op progression: GI, pain control,  mobility    Mariene Dickerman PAUL 05/25/2018, 8:09 AM

## 2018-05-25 NOTE — Evaluation (Signed)
Occupational Therapy Evaluation Patient Details Name: [name] [name] MRN: [id number] DOB: 10-09-1945 Today's Date: 05/25/2018    History of Present Illness Pt is a 72 y/o male s/p elective L TKA. PMH includes CAD s/p stent placement, NSTEMI, DM, CKD, and nonhodgkin's lymphoma.    Clinical Impression   Pt admitted with the above diagnoses and presents with below problem list. Pt will benefit from continued acute OT to address the below listed deficits and maximize independence with basic ADLs prior to venue below. PTA pt was mod I with ADLs. Pt is at +2 mod A level for LB ADLs and functional transfers. Limited by pain and reporting feeling "weak" with onset in standing, eyes mostly closed at this point and pt stating that he needed to sit down. Suspect orthostatic. Vitals assessed as follows: Supine bp 148/69 and HR 43; Sitting up bp 126/67, HR 41. Unable to complete bp in standing but pulse was noted to have risen to the 60s. Pt supine in bed at end of session. Family present throughout session and plan is to go home at d/c. Discussed pt needing to be able to complete certain therapy milestones before d/c (walking distance to toilet with +1 assist, etc).      Follow Up Recommendations  Supervision/Assistance - 24 hour;Home health OT    Equipment Recommendations       Recommendations for Other Services       Precautions / Restrictions Precautions Precautions: Fall;Knee Precaution Comments: Reviewed knee precautions. Pt with very limited tolerance.  Restrictions Weight Bearing Restrictions: Yes LLE Weight Bearing: Weight bearing as tolerated      Mobility Bed Mobility Overal bed mobility: Needs Assistance Bed Mobility: Sit to Supine       Sit to supine: Mod assist   General bed mobility comments: Assist to advance LLE.   Transfers Overall transfer level: Needs assistance Equipment used: Rolling walker (2 wheeled) Transfers: Sit to/from [facility] Sit to  Stand: From elevated surface;Mod assist;+2 physical assistance Stand pivot transfers: Min assist;+2 safety/equipment       General transfer comment: mod A +2  to powerup. utilized pad underneath pt to facilitate power up. Cues for technique with rw. Feeling "weak" in standing position, requested to sit down before bp fully assessed. Assist to control descent.     Balance Overall balance assessment: Needs assistance Sitting-balance support: Feet supported;Single extremity supported;Bilateral upper extremity supported Sitting balance-Leahy Scale: Fair Sitting balance - Comments: reporting feeling "weak"    Standing balance support: Bilateral upper extremity supported;During functional activity Standing balance-Leahy Scale: Poor Standing balance comment: Reliant on BUE support.                            ADL either performed or assessed with clinical judgement   ADL Overall ADL's : Needs assistance/impaired Eating/Feeding: Set up;Sitting   Grooming: Set up;Sitting   Upper Body Bathing: Set up;Sitting   Lower Body Bathing: Moderate assistance;+2 for physical assistance;Sit to/from stand   Upper Body Dressing : Set up;Sitting   Lower Body Dressing: Moderate assistance;+2 for physical assistance;Sit to/from stand   Toilet Transfer: Minimal assistance;Stand-pivot;+2 for safety/equipment   Toileting- Clothing Manipulation and Hygiene: Moderate assistance;+2 for physical assistance;Sit to/from stand   Tub/ Banker: Moderate assistance;+2 for physical assistance;Stand-pivot;3 in 1;Rolling walker     General ADL Comments: Pt completed pivotal steps from recliner to EOB to simulate toilet transfer.     Vision  Perception     Praxis      Pertinent Vitals/Pain Pain Assessment: Faces Faces Pain Scale: Hurts whole lot Pain Location: L knee  Pain Descriptors / Indicators: Aching;Operative site guarding Pain Intervention(s): Limited activity within  patient's tolerance;Monitored during session;Repositioned     Hand Dominance Right   Extremity/Trunk Assessment Upper Extremity Assessment Upper Extremity Assessment: Overall WFL for tasks assessed   Lower Extremity Assessment Lower Extremity Assessment: Defer to PT evaluation   Cervical / Trunk Assessment Cervical / Trunk Assessment: Normal   Communication Communication Communication: No difficulties   Cognition Arousal/Alertness: Awake/alert Behavior During Therapy: WFL for tasks assessed/performed Overall Cognitive Status: Within Functional Limits for tasks assessed                                     General Comments       Exercises     Shoulder Instructions      Home Living Family/patient expects to be discharged to:: Private residence Living Arrangements: Spouse/significant other Available Help at Discharge: Family;Available 24 hours/day Type of Home: House Home Access: Ramped entrance     Home Layout: One level     Bathroom Shower/Tub: Teacher, early years/pre: Standard     Home Equipment: Environmental consultant - 2 wheels;Bedside commode;Cane - single point          Prior Functioning/Environment Level of Independence: Independent with assistive device(s)        Comments: Was using cane for ambulation         OT Problem List: Decreased strength;Decreased activity tolerance;Impaired balance (sitting and/or standing);Decreased knowledge of use of DME or AE;Decreased knowledge of precautions;Cardiopulmonary status limiting activity;Obesity;Pain      OT Treatment/Interventions: Self-care/ADL training;DME and/or AE instruction;Therapeutic activities;Patient/family education;Balance training    OT Goals(Current goals can be found in the care plan section) Acute Rehab OT Goals Patient Stated Goal: "for the pain to get better"  OT Goal Formulation: With patient/family Time For Goal Achievement: 06/08/18 Potential to Achieve Goals:  Good ADL Goals Pt Will Perform Lower Body Bathing: with min guard assist;sit to/from stand Pt Will Perform Lower Body Dressing: with min guard assist;sit to/from stand Pt Will Transfer to Toilet: with min guard assist;ambulating Pt Will Perform Toileting - Clothing Manipulation and hygiene: with min guard assist;sit to/from stand Pt Will Perform Tub/Shower Transfer: with min guard assist;ambulating;rolling walker;3 in 1 Additional ADL Goal #1: Pt will complete bed mobility at mod I level to prepare for OOB ADLs.  OT Frequency: Min 3X/week   Barriers to D/C:    Pt spouse to assist at home. +2 assist needed on OT eval.       Co-evaluation PT/OT/SLP Co-Evaluation/Treatment: Yes Reason for Co-Treatment: For patient/therapist safety;To address functional/ADL transfers   OT goals addressed during session: ADL's and self-care      AM-PAC PT "6 Clicks" Daily Activity     Outcome Measure Help from another person eating meals?: None Help from another person taking care of personal grooming?: None Help from another person toileting, which includes using toliet, bedpan, or urinal?: A Lot Help from another person bathing (including washing, rinsing, drying)?: A Lot Help from another person to put on and taking off regular upper body clothing?: A Little Help from another person to put on and taking off regular lower body clothing?: A Lot 6 Click Score: 17   End of Session Equipment Utilized During Treatment: Gait belt;Rolling walker  Activity Tolerance: Other (comment);Patient limited by fatigue;Patient limited by pain(Pt reporting feeling "weak", eyes closed and requesting to s) Patient left: in bed;with call bell/phone within reach;with family/visitor present  OT Visit Diagnosis: Unsteadiness on feet (R26.81);Muscle weakness (generalized) (M62.81);Pain Pain - Right/Left: Left Pain - part of body: Knee                Time: 4492-0100 OT Time Calculation (min): 28 min Charges:  OT General  Charges $OT Visit: 1 Visit OT Evaluation $OT Eval Low Complexity: [place], OT Acute Rehabilitation Services Pager: [phone] Office: [phone]   Hortencia Pilar 05/25/2018, 2:25 PM

## 2018-05-26 LAB — BASIC METABOLIC PANEL
ANION GAP: 10 (ref 5–15)
BUN: 45 mg/dL — ABNORMAL HIGH (ref 8–23)
CO2: 23 mmol/L (ref 22–32)
Calcium: 8.7 mg/dL — ABNORMAL LOW (ref 8.9–10.3)
Chloride: 102 mmol/L (ref 98–111)
Creatinine, Ser: 3.05 mg/dL — ABNORMAL HIGH (ref 0.61–1.24)
GFR calc Af Amer: 22 mL/min — ABNORMAL LOW (ref >60)
GFR, EST NON AFRICAN AMERICAN: 19 mL/min — AB (ref >60)
Glucose, Bld: 130 mg/dL — ABNORMAL HIGH (ref 70–99)
POTASSIUM: 3 mmol/L — AB (ref 3.5–5.1)
Sodium: 135 mmol/L (ref 135–145)

## 2018-05-26 LAB — CBC WITH DIFFERENTIAL/PLATELET
ABS IMMATURE GRANULOCYTES: 0.05 10*3/uL (ref 0.00–0.07)
Basophils Absolute: 0.1 10*3/uL (ref 0.0–0.1)
Basophils Relative: 1 %
Eosinophils Absolute: 0.4 10*3/uL (ref 0.0–0.5)
Eosinophils Relative: 4 %
HCT: 40 % (ref 39.0–52.0)
Hemoglobin: 13 g/dL (ref 13.0–17.0)
IMMATURE GRANULOCYTES: 1 %
LYMPHS ABS: 1.3 10*3/uL (ref 0.7–4.0)
LYMPHS PCT: 14 %
MCH: 27.4 pg (ref 26.0–34.0)
MCHC: 32.5 g/dL (ref 30.0–36.0)
MCV: 84.4 fL (ref 80.0–100.0)
MONOS PCT: 8 %
Monocytes Absolute: 0.8 10*3/uL (ref 0.1–1.0)
NEUTROS ABS: 6.4 10*3/uL (ref 1.7–7.7)
NEUTROS PCT: 72 %
PLATELETS: 137 10*3/uL — AB (ref 150–400)
RBC: 4.74 MIL/uL (ref 4.22–5.81)
RDW: 14.7 % (ref 11.5–15.5)
WBC: 8.9 10*3/uL (ref 4.0–10.5)
nRBC: 0 % (ref 0.0–0.2)

## 2018-05-26 LAB — GLUCOSE, CAPILLARY
GLUCOSE-CAPILLARY: 113 mg/dL — AB (ref 70–99)
Glucose-Capillary: 129 mg/dL — ABNORMAL HIGH (ref 70–99)
Glucose-Capillary: 134 mg/dL — ABNORMAL HIGH (ref 70–99)
Glucose-Capillary: 138 mg/dL — ABNORMAL HIGH (ref 70–99)

## 2018-05-26 MED ORDER — ASPIRIN 325 MG PO TBEC: 325.0000 mg | DELAYED_RELEASE_TABLET | Freq: Two times a day (BID) | ORAL | 0 refills | Status: DC

## 2018-05-26 MED ORDER — TIZANIDINE HCL 4 MG PO TABS: 4.0000 mg | ORAL_TABLET | Freq: Four times a day (QID) | ORAL | 1 refills | Status: DC | PRN

## 2018-05-26 MED ORDER — BISACODYL 5 MG PO TBEC: 5.0000 mg | DELAYED_RELEASE_TABLET | Freq: Every day | ORAL | 0 refills | Status: DC | PRN

## 2018-05-26 MED ORDER — HYDROCODONE-ACETAMINOPHEN 7.5-325 MG PO TABS: 1.0000 | ORAL_TABLET | Freq: Four times a day (QID) | ORAL | 0 refills | Status: DC | PRN

## 2018-05-26 MED ORDER — DOCUSATE SODIUM 100 MG PO CAPS: 100.0000 mg | ORAL_CAPSULE | Freq: Two times a day (BID) | ORAL | 0 refills | Status: DC

## 2018-05-26 NOTE — Progress Notes (Signed)
Occupational Therapy Treatment Patient Details Name: [name] [name] MRN: [id number] DOB: Apr 09, 1946 Today's Date: 05/26/2018    History of present illness Pt is a 72 y/o male s/p elective L TKA. PMH includes CAD s/p stent placement, NSTEMI, DM, CKD, and nonhodgkin's lymphoma.    OT comments  Pt making limited progress towards OT goals this session. Pt able to perform sit<>stand with max A +2 from recliner surface, and max cues for sequencing. Pt initially stating "I can't do this, it hurts too much" extensive time taken to explain the importance of moving his operated leg for a pain management standpoint and from a functional standpoint. Pt complaining of overall generalized weakness - and explained the importance of him doing what he can for himself (especially with BUE during grooming tasks). Pt has good support from significant other and daughter. Updated POC to include post-acute rehab at SNF level to maximize safety and independence in ADL and functional transfers.   Follow Up Recommendations  SNF;Supervision/Assistance - 24 hour    Equipment Recommendations  Other (comment)(defer to next venue)    Recommendations for Other Services      Precautions / Restrictions Precautions Precautions: Fall;Knee Precaution Booklet Issued: No Precaution Comments: Reviewed knee precautions. PT with very limited tolerance.  Restrictions Weight Bearing Restrictions: Yes LLE Weight Bearing: Weight bearing as tolerated       Mobility Bed Mobility Overal bed mobility: Needs Assistance Bed Mobility: Sit to Supine     Supine to sit: Max assist;+2 for physical assistance;HOB elevated Sit to supine: Max assist;+2 for safety/equipment;+2 for physical assistance   General bed mobility comments: total A for LB - given max A because therapy did not have to help with upper half  Transfers Overall transfer level: Needs assistance Equipment used: Rolling walker (2 wheeled) Transfers: Sit to/from  Stand Sit to Stand: Max assist;+2 physical assistance(rocking technique utilized for [employer])         General transfer comment: max encouragement to participate, max A +2 for boost from recliner, vc for sequencing and hand placement    Balance Overall balance assessment: Needs assistance Sitting-balance support: Feet supported;Bilateral upper extremity supported Sitting balance-Leahy Scale: Fair Sitting balance - Comments: reporting feeling "weak"    Standing balance support: Bilateral upper extremity supported;During functional activity Standing balance-Leahy Scale: Poor Standing balance comment: Reliant on BUE support.                            ADL either performed or assessed with clinical judgement   ADL Overall ADL's : Needs assistance/impaired                 Upper Body Dressing : Set up;Sitting Upper Body Dressing Details (indicate cue type and reason): EOb for new gown     Toilet Transfer: Maximal assistance;+2 for physical assistance;+2 for safety/equipment;Ambulation;BSC;RW Toilet Transfer Details (indicate cue type and reason): simulated through recliner to bed transfer/ambulation         Functional mobility during ADLs: Minimal assistance;+2 for physical assistance;+2 for safety/equipment;Cueing for safety;Cueing for sequencing;Rolling walker       Vision       Perception     Praxis      Cognition Arousal/Alertness: Suspect due to medications Behavior During Therapy: [facility] for tasks assessed/performed;Flat affect Overall Cognitive Status: Within Functional Limits for tasks assessed  General Comments: cues throughout session for Pt to keep eyes open        Exercises Total Joint Exercises Ankle Circles/Pumps: AROM;Both;20 reps Quad Sets: AROM;Left;10 reps Heel Slides: AAROM;Left;5 reps Goniometric ROM: 15-60 degrees knee AROM   Shoulder Instructions       General Comments  girlfriend present and assisting during the session - providing encouragement    Pertinent Vitals/ Pain       Pain Assessment: 0-10 Pain Score: 10-Worst pain ever Pain Location: Lft knee  Pain Descriptors / Indicators: Aching;Operative site guarding;Moaning;Sharp Pain Intervention(s): Monitored during session;Repositioned  Home Living                                          Prior Functioning/Environment              Frequency  Min 2X/week        Progress Toward Goals  OT Goals(current goals can now be found in the care plan section)  Progress towards OT goals: Progressing toward goals(limited)  Acute Rehab OT Goals Patient Stated Goal: "for the pain to get better"  OT Goal Formulation: With patient/family Time For Goal Achievement: 06/08/18 Potential to Achieve Goals: Good  Plan Discharge plan needs to be updated;Frequency needs to be updated    Co-evaluation    PT/OT/SLP Co-Evaluation/Treatment: Yes Reason for Co-Treatment: For patient/therapist safety;To address functional/ADL transfers PT goals addressed during session: Mobility/safety with mobility;Balance;Proper use of DME;Strengthening/ROM OT goals addressed during session: ADL's and self-care;Proper use of Adaptive equipment and DME;Strengthening/ROM      AM-PAC PT "6 Clicks" Daily Activity     Outcome Measure   Help from another person eating meals?: None Help from another person taking care of personal grooming?: None Help from another person toileting, which includes using toliet, bedpan, or urinal?: A Lot Help from another person bathing (including washing, rinsing, drying)?: A Lot Help from another person to put on and taking off regular upper body clothing?: A Little Help from another person to put on and taking off regular lower body clothing?: A Lot 6 Click Score: 17    End of Session Equipment Utilized During Treatment: Gait belt;Rolling walker  OT Visit Diagnosis:  Unsteadiness on feet (R26.81);Muscle weakness (generalized) (M62.81);Pain Pain - Right/Left: Left Pain - part of body: Knee   Activity Tolerance Patient tolerated treatment well(lots of education for pain management/moving)   Patient Left in bed;with call bell/phone within reach;with family/visitor present   Nurse Communication Mobility status        Time: 1696-7893 OT Time Calculation (min): 29 min  Charges: OT General Charges $OT Visit: 1 Visit OT Treatments $Self Care/Home Management : 8-22 mins  Hulda Humphrey OTR/L Acute Rehabilitation Services Pager: [phone] Office: [place] 05/26/2018, 3:52 PM

## 2018-05-26 NOTE — Progress Notes (Signed)
Physical Therapy Treatment Patient Details Name: [name] [name] MRN: [id number] DOB: [birth year] Today's Date: 05/26/2018    History of Present Illness Pt is a 72 y/o male s/p elective L TKA. PMH includes CAD s/p stent placement, NSTEMI, DM, CKD, and nonhodgkin's lymphoma.     PT Comments    Patient seen for mobility progression. Pt is making progress toward PT goals and tolerated short distance gait in room this session. Continue to progress as tolerated with anticipated d/c to SNF for further skilled PT services.     Follow Up Recommendations  Follow surgeon's recommendation for DC plan and follow-up therapies;Supervision for mobility/OOB     Equipment Recommendations  None recommended by PT    Recommendations for Other Services OT consult     Precautions / Restrictions Precautions Precautions: Fall;Knee Precaution Booklet Issued: No Precaution Comments: Reviewed knee precautions. PT with very limited tolerance.  Restrictions Weight Bearing Restrictions: Yes LLE Weight Bearing: Weight bearing as tolerated    Mobility  Bed Mobility Overal bed mobility: Needs Assistance Bed Mobility: Sit to Supine       Sit to supine: Max assist;+2 for safety/equipment;+2 for physical assistance   General bed mobility comments: cues for sequencing; total A for LB - given max A because therapy did not have to help with upper half  Transfers Overall transfer level: Needs assistance Equipment used: Rolling walker (2 wheeled) Transfers: Sit to/from Stand Sit to Stand: Max assist;+2 physical assistance(rocking technique utilized for [employer])         General transfer comment: max encouragement to participate, max A +2 for boost from recliner, vc for sequencing and hand placement  Ambulation/Gait Ambulation/Gait assistance: Min assist;+2 safety/equipment Gait Distance (Feet): (~14 ft total in room ) Assistive device: Rolling walker (2 wheeled) Gait Pattern/deviations: Step-to  pattern;Decreased stance time - left;Decreased step length - right;Decreased weight shift to left;Trunk flexed Gait velocity: Decreased   General Gait Details: max vc for sequencing, safe use of AD, posture, and using bilat UE to offload L LE when needed; heavy reliance on RW    Stairs             Wheelchair Mobility    Modified Rankin (Stroke Patients Only)       Balance Overall balance assessment: Needs assistance Sitting-balance support: Feet supported;Bilateral upper extremity supported Sitting balance-Leahy Scale: Fair Sitting balance - Comments: reporting feeling "weak"    Standing balance support: Bilateral upper extremity supported;During functional activity Standing balance-Leahy Scale: Poor Standing balance comment: Reliant on BUE support.                             Cognition Arousal/Alertness: Suspect due to medications Behavior During Therapy: WFL for tasks assessed/performed;Flat affect Overall Cognitive Status: Within Functional Limits for tasks assessed                                 General Comments: cues throughout session for Pt to keep eyes open      Exercises      General Comments General comments (skin integrity, edema, etc.): girlfriend present and encouraging       Pertinent Vitals/Pain Pain Assessment: 0-10 Pain Score: 10-Worst pain ever Pain Location: Lft knee  Pain Descriptors / Indicators: Aching;Operative site guarding;Moaning;Sharp Pain Intervention(s): Monitored during session;Premedicated before session;Repositioned    Home Living  Prior Function            PT Goals (current goals can now be found in the care plan section) Acute Rehab PT Goals Patient Stated Goal: "for the pain to get better"  Progress towards PT goals: Progressing toward goals    Frequency    7X/week      PT Plan Current plan remains appropriate    Co-evaluation PT/OT/SLP  Co-Evaluation/Treatment: Yes Reason for Co-Treatment: For patient/therapist safety;To address functional/ADL transfers PT goals addressed during session: Mobility/safety with mobility;Balance;Proper use of DME;Strengthening/ROM OT goals addressed during session: ADL's and self-care;Proper use of Adaptive equipment and DME;Strengthening/ROM      AM-PAC PT "6 Clicks" Daily Activity  Outcome Measure  Difficulty turning over in bed (including adjusting bedclothes, sheets and blankets)?: Unable Difficulty moving from lying on back to sitting on the side of the bed? : Unable Difficulty sitting down on and standing up from a chair with arms (e.g., wheelchair, bedside commode, etc,.)?: Unable Help needed moving to and from a bed to chair (including a wheelchair)?: A Lot Help needed walking in hospital room?: A Lot Help needed climbing 3-5 steps with a railing? : Total 6 Click Score: 8    End of Session Equipment Utilized During Treatment: Gait belt Activity Tolerance: Patient limited by pain Patient left: with call bell/phone within reach;in bed;with family/visitor present Nurse Communication: Mobility status PT Visit Diagnosis: Other abnormalities of gait and mobility (R26.89);Pain Pain - Right/Left: Left Pain - part of body: Knee     Time: 1425-1454 PT Time Calculation (min) (ACUTE ONLY): 29 min  Charges:  $Gait Training: 8-22 mins                     Earney Navy, PTA Acute Rehabilitation Services Pager: [phone] Office: [phone]     Darliss Cheney 05/26/2018, 5:27 PM

## 2018-05-26 NOTE — NC FL2 (Signed)
[place] LEVEL OF CARE SCREENING TOOL     IDENTIFICATION  Patient Name: [name] [name] Birthdate: 1945/09/18 Sex: male Admission Date (Current Location): 05/24/2018  [facility] and [place] Number:  [employer] and Address:  [facility] [place], [place] [address] [place], [place] [address]      Provider Number: [id number]  Attending Physician Name and Address:  Melrose Nakayama, MD  Relative Name and Phone Number:  Neoma Laming (friend)    Current Level of Care: Hospital Recommended Level of Care: [place] Prior Approval Number:    Date Approved/Denied:   PASRR Number: [id number]  Discharge Plan: SNF    Current Diagnoses: Patient Active Problem List   Diagnosis Date Noted  . Primary osteoarthritis of left knee 05/24/2018  . Primary localized osteoarthritis of left knee 05/19/2018  . Acute on chronic systolic and diastolic heart failure, NYHA class 1 ([place]) 04/01/2017  . CAD (coronary artery disease) 04/01/2017  . SOB (shortness of breath) 03/21/2017  . HTN (hypertension) 03/21/2017  . NSTEMI (non-ST elevated myocardial infarction) ([place]) 01/19/2017  . NSTEMI, initial episode of care ([place]) 01/19/2017  . Sepsis ([place]) 07/24/2016  . Elevated troponin I level 07/24/2016  . Fever 07/24/2016  . Lactic acidosis 07/24/2016  . Adjustment insomnia 05/18/2016  . Erectile dysfunction 12/19/2015  . Sleep apnea 09/30/2015  . Osteoarthritis 09/30/2015  . Hypothyroidism 09/30/2015  . Hypogonadism in male 09/30/2015  . Diabetic neuropathy ([place]) 09/30/2015  . Chronic pain of left knee 09/30/2015  . Benign essential hypertension 09/30/2015  . Acute on chronic kidney failure ([place]) 05/14/2015  . Diarrhea 05/14/2015  . Dehydration 05/14/2015  . Hypokalemia 05/14/2015  . 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[place]) 10/05/2014  . Pelvic mass in male   . Varices, esophageal ([place])   . Colonic mass   . Abnormal CT scan, pelvis   . Bladder mass   .  Elevated liver enzymes   . Elevated LFTs   . Abdominal pain 07/23/2014  . Chronic kidney disease Stage IV 07/23/2014  . Morbid obesity ([place]) 07/23/2014  . Type 2 diabetes mellitus with stage 4 chronic kidney disease ([place]) 07/23/2014  . Hypertension 07/23/2014  . S/P right TKA 11/11/2011    Orientation RESPIRATION BLADDER Height & Weight     Self, Time, Situation, Place  Normal Continent Weight: 297 lb (134.7 kg) Height:  6\' 1"  (185.4 cm)  BEHAVIORAL SYMPTOMS/MOOD NEUROLOGICAL BOWEL NUTRITION STATUS      Continent Diet(see discharge summary)  AMBULATORY STATUS COMMUNICATION OF NEEDS Skin   Extensive Assist Verbally Surgical wounds(left knee closed surgical incision)                       Personal Care Assistance Level of Assistance  Bathing, Feeding, Dressing, Total care Bathing Assistance: Limited assistance Feeding assistance: Independent Dressing Assistance: Limited assistance Total Care Assistance: Maximum assistance   Functional Limitations Info  Sight, Hearing, Speech Sight Info: Adequate Hearing Info: Adequate Speech Info: Adequate    SPECIAL CARE FACTORS FREQUENCY  PT (By licensed PT), OT (By licensed OT)     PT Frequency: 7x weekly OT Frequency: 3x weekly            Contractures Contractures Info: Not present    Additional Factors Info  Code Status, Allergies Code Status Info: full Allergies Info: no known           Current Medications (05/26/2018):  This is the current hospital active medication list Current Facility-Administered Medications  Medication Dose Route Frequency Provider Last Rate Last Dose  . acetaminophen (TYLENOL) tablet 325-650 mg  325-650 mg Oral Q6H PRN Loni Dolly, PA-C   325 mg at 05/26/18 0102  . albuterol (PROVENTIL) (2.5 MG/3ML) 0.083% nebulizer solution 2.5 mg  2.5 mg Inhalation Q6H PRN Loni Dolly, PA-C      . alum & mag hydroxide-simeth (MAALOX/MYLANTA) 200-200-20 MG/5ML suspension 30 mL  30 mL Oral Q4H PRN Loni Dolly, PA-C      . aspirin EC tablet 325 mg  325 mg Oral BID PC Loni Dolly, PA-C   325 mg at 05/26/18 0810  . bisacodyl (DULCOLAX) EC tablet 5 mg  5 mg Oral Daily PRN Loni Dolly, PA-C      . bisoprolol (ZEBETA) tablet 5 mg  5 mg Oral Daily Loni Dolly, PA-C   5 mg at 05/26/18 1018  . clopidogrel (PLAVIX) tablet 75 mg  75 mg Oral Daily Loni Dolly, PA-C   75 mg at 05/26/18 1016  . diphenhydrAMINE (BENADRYL) 12.5 MG/5ML elixir 12.5-25 mg  12.5-25 mg Oral Q4H PRN Loni Dolly, PA-C   25 mg at 05/26/18 1359  . docusate sodium (COLACE) capsule 100 mg  100 mg Oral BID Loni Dolly, PA-C   100 mg at 05/26/18 1017  . losartan (COZAAR) tablet 100 mg  100 mg Oral Daily Melrose Nakayama, MD   100 mg at 05/26/18 1016   And  . hydrochlorothiazide (HYDRODIURIL) tablet 25 mg  25 mg Oral Daily Melrose Nakayama, MD   25 mg at 05/26/18 1016  . HYDROcodone-acetaminophen (NORCO) 7.5-325 MG per tablet 1-2 tablet  1-2 tablet Oral Q4H PRN Loni Dolly, PA-C   2 tablet at 05/26/18 0445  . HYDROcodone-acetaminophen (NORCO/VICODIN) 5-325 MG per tablet 1-2 tablet  1-2 tablet Oral Q4H PRN Loni Dolly, PA-C   1 tablet at 05/25/18 1053  . insulin aspart (novoLOG) injection 0-20 Units  0-20 Units Subcutaneous TID WC Loni Dolly, PA-C   3 Units at 05/26/18 4268  . insulin glargine (LANTUS) injection 30 Units  30 Units Subcutaneous QHS Lisette Abu, PA-C   30 Units at 05/25/18 2200  . lactated ringers infusion   Intravenous Continuous Loni Dolly, PA-C 50 mL/hr at 05/24/18 2158    . levothyroxine (SYNTHROID, LEVOTHROID) tablet 300 mcg  300 mcg Oral QAC breakfast Loni Dolly, PA-C   300 mcg at 05/26/18 0554  . loratadine (CLARITIN) tablet 10 mg  10 mg Oral QPM Melrose Nakayama, MD   10 mg at 05/25/18 1701  . menthol-cetylpyridinium (CEPACOL) lozenge 3 mg  1 lozenge Oral PRN Loni Dolly, PA-C       Or  . phenol (CHLORASEPTIC) mouth spray 1 spray  1 spray Mouth/Throat PRN Loni Dolly, PA-C      . methocarbamol  (ROBAXIN) tablet 500 mg  500 mg Oral Q6H PRN Loni Dolly, PA-C   500 mg at 05/26/18 0810   Or  . methocarbamol (ROBAXIN) 500 mg in dextrose 5 % 50 mL IVPB  500 mg Intravenous Q6H PRN Loni Dolly, PA-C      . metoCLOPramide (REGLAN) tablet 5-10 mg  5-10 mg Oral Q8H PRN Loni Dolly, PA-C       Or  . metoCLOPramide (REGLAN) injection 5-10 mg  5-10 mg Intravenous Q8H PRN Loni Dolly, PA-C      . mometasone-formoterol (DULERA) 100-5 MCG/ACT inhaler 2 puff  2 puff Inhalation BID Loni Dolly, PA-C      . montelukast (SINGULAIR) tablet 10 mg  10 mg Oral  QHS Loni Dolly, PA-C   10 mg at 05/25/18 2200  . morphine 2 MG/ML injection 0.5-1 mg  0.5-1 mg Intravenous Q2H PRN Loni Dolly, PA-C   1 mg at 05/26/18 1359  . nitroGLYCERIN (NITROSTAT) SL tablet 0.4 mg  0.4 mg Sublingual Q5 min PRN Loni Dolly, PA-C      . ondansetron [facility]) tablet 4 mg  4 mg Oral Q6H PRN Loni Dolly, PA-C       Or  . ondansetron [facility]) injection 4 mg  4 mg Intravenous Q6H PRN Loni Dolly, PA-C      . simvastatin (ZOCOR) tablet 5 mg  5 mg Oral Daily Loni Dolly, PA-C   5 mg at 05/26/18 1017  . traZODone (DESYREL) tablet 50 mg  50 mg Oral QHS PRN Gary Fleet, PA-C   50 mg at 05/24/18 2143     Discharge Medications: Please see discharge summary for a list of discharge medications.  Relevant Imaging Results:  Relevant Lab Results:   Additional Information SSN: [id number]  Alberteen Sam, LCSW

## 2018-05-26 NOTE — Care Management Obs Status (Signed)
[facility] NOTIFICATION   Patient Details  Name: YOREL REDDER MRN: [id number] Date of Birth: Dec 18, 1945   Medicare Observation Status Notification Given:  Yes    Ninfa Meeker, RN 05/26/2018, 1:12 PM

## 2018-05-26 NOTE — Clinical Social Work Note (Signed)
Clinical Social Work Assessment  Patient Details  Name: [name] [name] MRN: [id number] Date of Birth: [birth year]  Date of referral:  05/26/18               Reason for consult:  Discharge Planning                Permission sought to share information with:  Facility Sport and exercise psychologist, Case Manager, Family Supports Permission granted to share information::  Yes, Verbal Permission Granted  Name::     Tax adviser::  SNFs  Relationship::  friend  Contact Information:  [phone]  Housing/Transportation Living arrangements for the past 2 months:  Single Family Home Source of Information:  Patient Patient Interpreter Needed:  None Criminal Activity/Legal Involvement Pertinent to Current Situation/Hospitalization:  No - Comment as needed Significant Relationships:  Adult Children Lives with:  Self Do you feel safe going back to the place where you live?  No Need for family participation in patient care:  Yes (Comment)  Care giving concerns:  CSW received referral for possible SNF placement at time of discharge. Spoke with patient regarding possibility of SNF placement . Patient's  family  is currently unable to care for him at their home given patient's current needs and fall risk.  Patient and patient friend at bedside Neoma Laming expressed understanding of PT recommendation and are agreeable to SNF placement at time of discharge. CSW to continue to follow and assist with discharge planning needs.     Social Worker assessment / plan:  Spoke with patient and friend Neoma Laming concerning possibility of rehab at [facility] before returning home.    Employment status:  Retired Nurse, adult PT Recommendations:  [place] / Referral to community resources:  [place]  Patient/Family's Response to care:  Patient and friend Neoma Laming    recognize need for rehab before returning home and are agreeable to a SNF near [place], [place].  They report preference for   [facility], [facility], [place], [place] in that order  . CSW explained insurance authorization process. Patient's family reported that they want patient to get stronger to be able to come back home.    Patient/Family's Understanding of and Emotional Response to Diagnosis, Current Treatment, and Prognosis:  Patient/family is realistic regarding therapy needs and expressed being hopeful for SNF placement. Patient expressed understanding of CSW role and discharge process as well as medical condition. No questions/concerns about plan or treatment.    Emotional Assessment Appearance:  Appears stated age Attitude/Demeanor/Rapport:  Guarded, Lethargic, Engaged, Gracious Affect (typically observed):  Adaptable Orientation:  Oriented to Self, Oriented to Place, Oriented to  Time, Oriented to Situation Alcohol / Substance use:  Not Applicable Psych involvement (Current and /or in the community):  No (Comment)  Discharge Needs  Concerns to be addressed:  Discharge Planning Concerns Readmission within the last 30 days:  No Current discharge risk:  Dependent with Mobility Barriers to Discharge:  Continued Medical Work up   [employer], LCSW 05/26/2018, 4:01 PM

## 2018-05-26 NOTE — Plan of Care (Signed)
  Problem: Education: Goal: Knowledge of General Education information will improve Description Including pain rating scale, medication(s)/side effects and non-pharmacologic comfort measures Outcome: Progressing   Problem: Clinical Measurements: Goal: Ability to maintain clinical measurements within normal limits will improve Outcome: Progressing Goal: Respiratory complications will improve Outcome: Progressing Goal: Cardiovascular complication will be avoided Outcome: Progressing   Problem: Pain Managment: Goal: General experience of comfort will improve Outcome: Progressing   Problem: Safety: Goal: Ability to remain free from injury will improve Outcome: Progressing   Problem: Skin Integrity: Goal: Risk for impaired skin integrity will decrease Outcome: Progressing   

## 2018-05-26 NOTE — Care Management CC44 (Deleted)
Condition Code 44 Documentation Completed  Patient Details  Name: MATTIX IMHOF MRN: [id number] Date of Birth: 08/19/1945   Condition Code 44 given:  Yes Patient signature on Condition Code 44 notice:  Yes Documentation of 2 MD's agreement:  Yes Code 44 added to claim:  Yes    Ninfa Meeker, RN 05/26/2018, 1:13 PM

## 2018-05-26 NOTE — Progress Notes (Addendum)
Subjective: 2 Days Post-Op Procedure(s) (LRB): LEFT TOTAL KNEE ARTHROPLASTY (Left)   Patient still hurting this morning but states he feels better. He is now urinating better. He now mentions that last time he had to go to SNF afterwards and thinks he may need that again.  Activity level:  wbat Diet tolerance:  ok Voiding:  ok Patient reports pain as moderate.    Objective: Vital signs in last 24 hours: Temp:  [98 F (36.7 C)-98.5 F (36.9 C)] 98.5 F (36.9 C) (10/24 0345) Pulse Rate:  [29-55] 55 (10/24 0345) Resp:  [14-20] 14 (10/24 0345) BP: (96-140)/(50-76) 125/69 (10/24 0345) SpO2:  [92 %-94 %] 94 % (10/24 0345)  Labs: Recent Labs    05/26/18 0313  HGB 13.0   Recent Labs    05/26/18 0313  WBC 8.9  RBC 4.74  HCT 40.0  PLT 137*   Recent Labs    05/26/18 0313  NA 135  K 3.0*  CL 102  CO2 23  BUN 45*  CREATININE 3.05*  GLUCOSE 130*  CALCIUM 8.7*   No results for input(s): LABPT, INR in the last 72 hours.  Physical Exam:  Neurologically intact ABD soft Neurovascular intact Sensation intact distally Intact pulses distally Dorsiflexion/Plantar flexion intact Incision: dressing C/D/I and no drainage No cellulitis present Compartment soft  Assessment/Plan:  2 Days Post-Op Procedure(s) (LRB): LEFT TOTAL KNEE ARTHROPLASTY (Left) Advance diet Up with therapy Discharge to SNF once cleared by PT and a bed is available. He does not this girlfriend will be able to get him up and moving at home. I informed the patient that he really needs to push it and get up and move with PT or his knee will continue to be stiff and painful. He will continue with PO fluids. His kidney functions are elevated but are at his baseline. He will continue on his 325mg  ASA BID along with baseline plavix for DVT prevention. We will recheck labs in the morning. Follow up in office 2 weeks post op. Anticipated LOS equal to or greater than 2 midnights due to - Age 71 and older with  one or more of the following:  - Obesity  - Expected need for hospital services (PT, OT, Nursing) required for safe  discharge  - Anticipated need for postoperative skilled nursing care or inpatient rehab  - Active co-morbidities: Diabetes, Coronary Artery Disease and Advanced Liver Disease OR   - Unanticipated findings during/Post Surgery: Slow post-op progression: GI, pain control, mobility  - Patient is a high risk of re-admission due to: None  Romir Klimowicz, Larwance Sachs 05/26/2018, 6:49 AM

## 2018-05-26 NOTE — Progress Notes (Addendum)
Physical Therapy Treatment Patient Details Name: [name] [name] MRN: [id number] DOB: 02-04-1946 Today's Date: 05/26/2018    History of Present Illness Pt is a 72 y/o male s/p elective L TKA. PMH includes CAD s/p stent placement, NSTEMI, DM, CKD, and nonhodgkin's lymphoma.     PT Comments    Patient limited in mobility today secondary to pain in left knee. Tolerated standing for ~1 minute with BUE support on RW and Mod A for balance. Not able to move LEs in standing. No dizziness reported today but pt with drop in BP with positional changes.  Supine BP 131/69  Sitting BP 117/64 Standing BP 109/73 Pt's knee AROM was 15-60 degrees. Pt falling asleep during session after being given morphine. Not safe to be home at this time. Difficulty progressing with mobility. Performed there ex with encouragement. SLow to perform all mobility due to pain. Will follow.   Follow Up Recommendations  Follow surgeon's recommendation for DC plan and follow-up therapies;Supervision for mobility/OOB     Equipment Recommendations  None recommended by PT    Recommendations for Other Services       Precautions / Restrictions Precautions Precautions: Fall;Knee Precaution Booklet Issued: No Restrictions Weight Bearing Restrictions: Yes LLE Weight Bearing: Weight bearing as tolerated    Mobility  Bed Mobility Overal bed mobility: Needs Assistance Bed Mobility: Supine to Sit     Supine to sit: Max assist;+2 for physical assistance;HOB elevated     General bed mobility comments: Assist to advance LLE and with trunk. very slow with lots of pauses due to pain  Transfers Overall transfer level: Needs assistance Equipment used: Rolling walker (2 wheeled) Transfers: Sit to/from Stand Sit to Stand: Max assist;+2 physical assistance;From elevated surface         General transfer comment: max A +2  to powerup. utilized pad underneath pt to facilitate power up. Cues for technique and hand placement.  Pulled chair behind pt as not able to clear LEs to advance.   Ambulation/Gait             General Gait Details: Deferred as pt in too much pain today and unable.   Stairs             Wheelchair Mobility    Modified Rankin (Stroke Patients Only)       Balance Overall balance assessment: Needs assistance Sitting-balance support: Feet supported;Bilateral upper extremity supported Sitting balance-Leahy Scale: Fair     Standing balance support: Bilateral upper extremity supported;During functional activity   Standing balance comment: Reliant on BUE support.                             Cognition Arousal/Alertness: Awake/alert Behavior During Therapy: WFL for tasks assessed/performed Overall Cognitive Status: Within Functional Limits for tasks assessed                                 General Comments: sleepy after given morphine      Exercises Total Joint Exercises Ankle Circles/Pumps: AROM;Both;20 reps Quad Sets: AROM;Left;10 reps Heel Slides: AAROM;Left;5 reps Goniometric ROM: 15-60 degrees knee AROM    General Comments General comments (skin integrity, edema, etc.): Supine BP 131/69 sitting BP 117/64, Standing BP 109/73, no dizziness or weakness reported. Wife present during session.      Pertinent Vitals/Pain Pain Assessment: 0-10 Pain Score: 9  Pain Location: Lft knee  Pain Descriptors /  Indicators: Aching;Operative site guarding Pain Intervention(s): Monitored during session;Repositioned;RN gave pain meds during session;Limited activity within patient's tolerance;Ice applied    Home Living                      Prior Function            PT Goals (current goals can now be found in the care plan section) Progress towards PT goals: Not progressing toward goals - comment(secondary to pain)    Frequency    7X/week      PT Plan Current plan remains appropriate    Co-evaluation              AM-PAC PT  "6 Clicks" Daily Activity  Outcome Measure  Difficulty turning over in bed (including adjusting bedclothes, sheets and blankets)?: Unable Difficulty moving from lying on back to sitting on the side of the bed? : Unable Difficulty sitting down on and standing up from a chair with arms (e.g., wheelchair, bedside commode, etc,.)?: Unable Help needed moving to and from a bed to chair (including a wheelchair)?: A Lot Help needed walking in hospital room?: Total Help needed climbing 3-5 steps with a railing? : Total 6 Click Score: 7    End of Session Equipment Utilized During Treatment: Gait belt Activity Tolerance: Patient limited by pain Patient left: in chair;with call bell/phone within reach;with family/visitor present Nurse Communication: Mobility status PT Visit Diagnosis: Other abnormalities of gait and mobility (R26.89);Pain Pain - Right/Left: Left Pain - part of body: Knee     Time: 0981-1914 PT Time Calculation (min) (ACUTE ONLY): 39 min  Charges:  $Therapeutic Exercise: 8-22 mins $Therapeutic Activity: 23-37 mins                     Wray Kearns, [place], DPT Acute Rehabilitation Services Pager [phone] Office [phone]       Marguarite Arbour A Sabra Heck 05/26/2018, 12:39 PM

## 2018-05-27 DIAGNOSIS — Z794 Long term (current) use of insulin: Secondary | ICD-10-CM | POA: Diagnosis not present

## 2018-05-27 DIAGNOSIS — E785 Hyperlipidemia, unspecified: Secondary | ICD-10-CM | POA: Diagnosis not present

## 2018-05-27 DIAGNOSIS — F17210 Nicotine dependence, cigarettes, uncomplicated: Secondary | ICD-10-CM | POA: Diagnosis not present

## 2018-05-27 DIAGNOSIS — Z955 Presence of coronary angioplasty implant and graft: Secondary | ICD-10-CM | POA: Diagnosis not present

## 2018-05-27 DIAGNOSIS — M1712 Unilateral primary osteoarthritis, left knee: Secondary | ICD-10-CM | POA: Diagnosis not present

## 2018-05-27 DIAGNOSIS — E1151 Type 2 diabetes mellitus with diabetic peripheral angiopathy without gangrene: Secondary | ICD-10-CM | POA: Diagnosis present

## 2018-05-27 DIAGNOSIS — E039 Hypothyroidism, unspecified: Secondary | ICD-10-CM | POA: Diagnosis not present

## 2018-05-27 DIAGNOSIS — G4730 Sleep apnea, unspecified: Secondary | ICD-10-CM | POA: Diagnosis present

## 2018-05-27 DIAGNOSIS — Z96651 Presence of right artificial knee joint: Secondary | ICD-10-CM | POA: Diagnosis present

## 2018-05-27 DIAGNOSIS — I2510 Atherosclerotic heart disease of native coronary artery without angina pectoris: Secondary | ICD-10-CM | POA: Diagnosis not present

## 2018-05-27 DIAGNOSIS — Z7902 Long term (current) use of antithrombotics/antiplatelets: Secondary | ICD-10-CM | POA: Diagnosis not present

## 2018-05-27 DIAGNOSIS — I252 Old myocardial infarction: Secondary | ICD-10-CM | POA: Diagnosis not present

## 2018-05-27 DIAGNOSIS — Z6839 Body mass index (BMI) 39.0-39.9, adult: Secondary | ICD-10-CM | POA: Diagnosis not present

## 2018-05-27 DIAGNOSIS — I130 Hypertensive heart and chronic kidney disease with heart failure and stage 1 through stage 4 chronic kidney disease, or unspecified chronic kidney disease: Secondary | ICD-10-CM | POA: Diagnosis not present

## 2018-05-27 DIAGNOSIS — N184 Chronic kidney disease, stage 4 (severe): Secondary | ICD-10-CM | POA: Diagnosis not present

## 2018-05-27 DIAGNOSIS — N529 Male erectile dysfunction, unspecified: Secondary | ICD-10-CM | POA: Diagnosis present

## 2018-05-27 DIAGNOSIS — Z972 Presence of dental prosthetic device (complete) (partial): Secondary | ICD-10-CM | POA: Diagnosis not present

## 2018-05-27 DIAGNOSIS — I5042 Chronic combined systolic (congestive) and diastolic (congestive) heart failure: Secondary | ICD-10-CM | POA: Diagnosis not present

## 2018-05-27 DIAGNOSIS — E1122 Type 2 diabetes mellitus with diabetic chronic kidney disease: Secondary | ICD-10-CM | POA: Diagnosis not present

## 2018-05-27 DIAGNOSIS — G8929 Other chronic pain: Secondary | ICD-10-CM | POA: Diagnosis not present

## 2018-05-27 DIAGNOSIS — I447 Left bundle-branch block, unspecified: Secondary | ICD-10-CM | POA: Diagnosis not present

## 2018-05-27 DIAGNOSIS — M25762 Osteophyte, left knee: Secondary | ICD-10-CM | POA: Diagnosis present

## 2018-05-27 DIAGNOSIS — E1142 Type 2 diabetes mellitus with diabetic polyneuropathy: Secondary | ICD-10-CM | POA: Diagnosis present

## 2018-05-27 LAB — CBC
HCT: 38.8 % — ABNORMAL LOW (ref 39.0–52.0)
Hemoglobin: 13 g/dL (ref 13.0–17.0)
MCH: 28.4 pg (ref 26.0–34.0)
MCHC: 33.5 g/dL (ref 30.0–36.0)
MCV: 84.7 fL (ref 80.0–100.0)
NRBC: 0 % (ref 0.0–0.2)
PLATELETS: 157 10*3/uL (ref 150–400)
RBC: 4.58 MIL/uL (ref 4.22–5.81)
RDW: 15.2 % (ref 11.5–15.5)
WBC: 9.1 10*3/uL (ref 4.0–10.5)

## 2018-05-27 LAB — BASIC METABOLIC PANEL
ANION GAP: 10 (ref 5–15)
BUN: 44 mg/dL — ABNORMAL HIGH (ref 8–23)
CALCIUM: 8.9 mg/dL (ref 8.9–10.3)
CO2: 25 mmol/L (ref 22–32)
CREATININE: 2.91 mg/dL — AB (ref 0.61–1.24)
Chloride: 100 mmol/L (ref 98–111)
GFR calc non Af Amer: 20 mL/min — ABNORMAL LOW (ref >60)
GFR, EST AFRICAN AMERICAN: 23 mL/min — AB (ref >60)
Glucose, Bld: 122 mg/dL — ABNORMAL HIGH (ref 70–99)
Potassium: 3.2 mmol/L — ABNORMAL LOW (ref 3.5–5.1)
SODIUM: 135 mmol/L (ref 135–145)

## 2018-05-27 LAB — GLUCOSE, CAPILLARY
Glucose-Capillary: 113 mg/dL — ABNORMAL HIGH (ref 70–99)
Glucose-Capillary: 130 mg/dL — ABNORMAL HIGH (ref 70–99)
Glucose-Capillary: 104 mg/dL — ABNORMAL HIGH (ref 70–99)
Glucose-Capillary: 156 mg/dL — ABNORMAL HIGH (ref 70–99)

## 2018-05-27 NOTE — Progress Notes (Signed)
Patient will DC JA:[facility] Anticipated DC date:05/27/18 Family notified:Deborah Transport LZ:JQBH  Per MD patient ready for DC to [facility] . RN, patient, patient's family, and facility notified of DC. Discharge Summary sent to facility. RN given number for report [phone] . DC packet on chart. Ambulance transport requested for patient.  CSW signing off.  [place], [place]

## 2018-05-27 NOTE — Progress Notes (Addendum)
RN attempted to call report but nurse in on break. Will attempt to call again at another time.  RN gave report to [place] at [facility]. Pt's belongings gathered to be sent with him.

## 2018-05-27 NOTE — Plan of Care (Signed)
  Problem: Education: Goal: Knowledge of General Education information will improve Description Including pain rating scale, medication(s)/side effects and non-pharmacologic comfort measures Outcome: Progressing Note:  POC reviewed with pt.   

## 2018-05-27 NOTE — Progress Notes (Signed)
Subjective: 3 Days Post-Op Procedure(s) (LRB): LEFT TOTAL KNEE ARTHROPLASTY (Left)   Patient slept a little better last night. He is still a little drowsy this morning. He is hoping to go to SNF today. He did get up and walk in the room with PT yesterday.  Activity level:  wbat Diet tolerance:  ok Voiding:  ok Patient reports pain as mild and moderate.    Objective: Vital signs in last 24 hours: Temp:  [97.8 F (36.6 C)-98.8 F (37.1 C)] 98.2 F (36.8 C) (10/25 0345) Pulse Rate:  [51-68] 53 (10/25 0345) Resp:  [14-18] 14 (10/25 0345) BP: (113-156)/(65-74) 127/65 (10/25 0345) SpO2:  [91 %-98 %] 91 % (10/25 0345)  Labs: Recent Labs    05/26/18 0313 05/27/18 0341  HGB 13.0 13.0   Recent Labs    05/26/18 0313 05/27/18 0341  WBC 8.9 9.1  RBC 4.74 4.58  HCT 40.0 38.8*  PLT 137* 157   Recent Labs    05/26/18 0313 05/27/18 0341  NA 135 135  K 3.0* 3.2*  CL 102 100  CO2 23 25  BUN 45* 44*  CREATININE 3.05* 2.91*  GLUCOSE 130* 122*  CALCIUM 8.7* 8.9   No results for input(s): LABPT, INR in the last 72 hours.  Physical Exam:  Neurologically intact ABD soft Neurovascular intact Sensation intact distally Intact pulses distally Dorsiflexion/Plantar flexion intact Incision: dressing C/D/I and no drainage No cellulitis present Compartment soft  Assessment/Plan:  3 Days Post-Op Procedure(s) (LRB): LEFT TOTAL KNEE ARTHROPLASTY (Left) Advance diet Up with therapy Discharge to SNF today if bed available. Continue on 325mg  ASA and baseline plavix for DVT prevention. Follow up in office 2 weeks post op. Continue to push PO fluids to help with chronic kidney disease. Anticipated LOS equal to or greater than 2 midnights due to - Age 23 and older with one or more of the following:  - Obesity  - Expected need for hospital services (PT, OT, Nursing) required for safe  discharge  - Anticipated need for postoperative skilled nursing care or inpatient rehab  - Active  co-morbidities: Diabetes and Coronary Artery Disease OR   - Unanticipated findings during/Post Surgery: Slow post-op progression: GI, pain control, mobility  - Patient is a high risk of re-admission due to: Barriers to post-acute care (logistical, no family support in home)  Shawnelle Spoerl, Larwance Sachs 05/27/2018, 8:05 AM

## 2018-05-27 NOTE — Clinical Social Work Placement (Signed)
   CLINICAL SOCIAL WORK PLACEMENT  NOTE  Date:  05/27/2018  Patient Details  Name: [name] [name] MRN: [id number] Date of Birth: Oct 31, 1945  Clinical Social Work is seeking post-discharge placement for this patient at the [place] level of care (*CSW will initial, date and re-position this form in  chart as items are completed):  Yes   Patient/family provided with [place] Work Department's list of facilities offering this level of care within the geographic area requested by the patient (or if unable, by the patient's family).  Yes   Patient/family informed of their freedom to choose among providers that offer the needed level of care, that participate in Medicare, Medicaid or managed care program needed by the patient, have an available bed and are willing to accept the patient.      Patient/family informed of [facility]'s ownership interest in [facility] and [facility], as well as of the fact that they are under no obligation to receive care at these facilities.  PASRR submitted to EDS on       PASRR number received on 05/26/18     Existing PASRR number confirmed on       FL2 transmitted to all facilities in geographic area requested by pt/family on 05/26/18     FL2 transmitted to all facilities within larger geographic area on       Patient informed that his/her managed care company has contracts with or will negotiate with certain facilities, including the following:        Yes   Patient/family informed of bed offers received.  Patient chooses bed at [facility]     Physician recommends and patient chooses bed at      Patient to be transferred to [facility] on 05/27/18.  Patient to be transferred to facility by [facility]     Patient family notified on 05/27/18 of transfer.  Name of family member notified:  Neoma Laming     PHYSICIAN       Additional Comment:     _______________________________________________ Alberteen Sam, LCSW 05/27/2018, 4:21 PM

## 2018-05-27 NOTE — Discharge Summary (Signed)
Patient ID: [name] [name] MRN: [id number] DOB/AGE: [birth year] 72 y.o.  Admit date: 05/24/2018 Discharge date: 05/27/2018  Admission Diagnoses:  Principal Problem:   Primary osteoarthritis of left knee Active Problems:   Primary localized osteoarthritis of left knee   Discharge Diagnoses:  Same  Past Medical History:  Diagnosis Date  . CAD (coronary artery disease)    DES to LAD June 2018  . CKD (chronic kidney disease) stage 3, GFR 30-59 ml/min (HCC)   . Complication of anesthesia    patient had an epidural with his right knee and it took him 6 hours before he could move, he does not want another one  . Essential hypertension   . History of pneumonia   . Hyperlipidemia   . Hypothyroidism   . LBBB (left bundle branch block)   . Morbid obesity ([place])   . Non Hodgkin's lymphoma ([place])    Status post XRT and chemotherapy  . NSTEMI (non-ST elevated myocardial infarction) [facility])    June 2018  . Peripheral neuropathy   . Sleep apnea   . Type 2 diabetes mellitus ([place])     Surgeries: Procedure(s): LEFT TOTAL KNEE ARTHROPLASTY on 05/24/2018   Consultants:   Discharged Condition: Improved  Hospital Course: BOLIVAR KORANDA is an 72 y.o. male who was admitted 05/24/2018 for operative treatment ofPrimary osteoarthritis of left knee. Patient has severe unremitting pain that affects sleep, daily activities, and work/hobbies. After pre-op clearance the patient was taken to the operating room on 05/24/2018 and underwent  Procedure(s): LEFT TOTAL KNEE ARTHROPLASTY.    Patient was given perioperative antibiotics:  Anti-infectives (From admission, onward)   Start     Dose/Rate Route Frequency Ordered Stop   05/24/18 1700  ceFAZolin (ANCEF) IVPB 2g/100 mL premix     2 g 200 mL/hr over 30 Minutes Intravenous Every 6 hours 05/24/18 1421 05/25/18 0053   05/24/18 1000  ceFAZolin (ANCEF) 3 g in dextrose 5 % 50 mL IVPB     3 g 100 mL/hr over 30 Minutes Intravenous To Surgery 05/23/18 0911  05/24/18 1059       Patient was given sequential compression devices, early ambulation, and chemoprophylaxis to prevent DVT.  Patient benefited maximally from hospital stay and there were no complications.    Recent vital signs:  Patient Vitals for the past 24 hrs:  BP Temp Temp src Pulse Resp SpO2  05/27/18 0345 127/65 98.2 F (36.8 C) Oral (!) 53 14 91 %  05/26/18 2049 - - - - - 93 %  05/26/18 1955 (!) 156/74 97.8 F (36.6 C) Oral 63 17 98 %  05/26/18 1739 (!) 150/66 98.8 F (37.1 C) Oral 68 18 96 %  05/26/18 0927 113/67 97.9 F (36.6 C) Oral (!) 51 16 93 %     Recent laboratory studies:  Recent Labs    05/26/18 0313 05/27/18 0341  WBC 8.9 9.1  HGB 13.0 13.0  HCT 40.0 38.8*  PLT 137* 157  NA 135 135  K 3.0* 3.2*  CL 102 100  CO2 23 25  BUN 45* 44*  CREATININE 3.05* 2.91*  GLUCOSE 130* 122*  CALCIUM 8.7* 8.9     Discharge Medications:   Allergies as of 05/27/2018   No Known Allergies     Medication List    STOP taking these medications   aspirin 81 MG tablet Replaced by:  aspirin 325 MG EC tablet     TAKE these medications   albuterol 108 (90 Base) MCG/ACT inhaler Commonly  known as:  PROVENTIL HFA;VENTOLIN HFA Inhale 2 puffs into the lungs every 6 (six) hours as needed for wheezing or shortness of breath.   amLODipine 10 MG tablet Commonly known as:  NORVASC Take 1 tablet (10 mg total) by mouth daily.   aspirin 325 MG EC tablet Take 1 tablet (325 mg total) by mouth 2 (two) times daily after a meal. Replaces:  aspirin 81 MG tablet   bisacodyl 5 MG EC tablet Commonly known as:  DULCOLAX Take 1 tablet (5 mg total) by mouth daily as needed for moderate constipation.   bisoprolol 5 MG tablet Commonly known as:  ZEBETA Take 1 tablet (5 mg total) by mouth daily.   clopidogrel 75 MG tablet Commonly known as:  PLAVIX Take 1 tablet (75 mg total) by mouth daily.   docusate sodium 100 MG capsule Commonly known as:  COLACE Take 1 capsule (100 mg  total) by mouth 2 (two) times daily.   furosemide 40 MG tablet Commonly known as:  LASIX Take 1 tablet (40 mg total) by mouth daily.   HYDROcodone-acetaminophen 7.5-325 MG tablet Commonly known as:  NORCO Take 1-2 tablets by mouth every 6 (six) hours as needed for moderate pain or severe pain.   hydrocortisone 2.5 % cream APPLY TOPICALLY DAILY AS NEEDED TO CATH SITE   insulin degludec 100 UNIT/ML Sopn FlexTouch Pen Commonly known as:  TRESIBA Inject 0.5 mLs (50 Units total) into the skin daily at 10 pm. What changed:  how much to take   isosorbide mononitrate 30 MG 24 hr tablet Commonly known as:  IMDUR Take 1 tablet (30 mg total) by mouth daily.   levocetirizine 5 MG tablet Commonly known as:  XYZAL Take 5 mg by mouth every evening.   levothyroxine 200 MCG tablet Commonly known as:  SYNTHROID, LEVOTHROID Take 200 mcg by mouth daily before breakfast.   levothyroxine 100 MCG tablet Commonly known as:  SYNTHROID, LEVOTHROID Take 100 mcg by mouth daily before breakfast.   losartan-hydrochlorothiazide 100-25 MG tablet Commonly known as:  HYZAAR Take 1 tablet by mouth daily.   montelukast 10 MG tablet Commonly known as:  SINGULAIR Take 10 mg by mouth at bedtime.   nitroGLYCERIN 0.4 MG SL tablet Commonly known as:  NITROSTAT Place 0.4 mg under the tongue every 5 (five) minutes as needed for chest pain.   NOVOLOG FLEXPEN 100 UNIT/ML FlexPen Generic drug:  insulin aspart Inject 4-5 Units into the skin daily as needed (blood sugar above 200).   simvastatin 5 MG tablet Commonly known as:  ZOCOR Take 5 mg by mouth daily.   Sodium Chloride-Sodium Bicarb 2300-700 MG Kit Place 1 Dose into the nose 2 (two) times daily.   SYMBICORT 80-4.5 MCG/ACT inhaler Generic drug:  budesonide-formoterol Inhale 2 puffs into the lungs daily as needed (shortness of breath).   tiZANidine 4 MG tablet Commonly known as:  ZANAFLEX Take 1 tablet (4 mg total) by mouth every 6 (six) hours as  needed.   traZODone 50 MG tablet Commonly known as:  DESYREL Take 50 mg by mouth at bedtime.            Durable Medical Equipment  (From admission, onward)         Start     Ordered   05/24/18 1421  DME Walker rolling  Once    Question:  Patient needs a walker to treat with the following condition  Answer:  Primary osteoarthritis of left knee   05/24/18 1421   05/24/18  1421  DME 3 n 1  Once     05/24/18 1421   05/24/18 1421  DME Bedside commode  Once    Question:  Patient needs a bedside commode to treat with the following condition  Answer:  Primary osteoarthritis of left knee   05/24/18 1421          Diagnostic Studies: Dg Chest 2 View  Result Date: 05/13/2018 CLINICAL DATA:  Preop for total knee replacement. EXAM: CHEST - 2 VIEW COMPARISON:  April 13, 2017 FINDINGS: The heart size and mediastinal contours are within normal limits. There is no focal infiltrate, pulmonary edema, or pleural effusion. Degenerative joint changes of the spine are noted. IMPRESSION: No active cardiopulmonary disease. Electronically Signed   By: Abelardo Diesel M.D.   On: 05/13/2018 14:33    Disposition: Discharge disposition: 03-Skilled Nursing Facility       Discharge Instructions    Call MD / Call 911   Complete by:  As directed    If you experience chest pain or shortness of breath, CALL 911 and be transported to the hospital emergency room.  If you develope a fever above 101 F, pus (white drainage) or increased drainage or redness at the wound, or calf pain, call your surgeon's office.   Constipation Prevention   Complete by:  As directed    Drink plenty of fluids.  Prune juice may be helpful.  You may use a stool softener, such as Colace (over the counter) 100 mg twice a day.  Use MiraLax (over the counter) for constipation as needed.   Diet - low sodium heart healthy   Complete by:  As directed    Discharge instructions   Complete by:  As directed    INSTRUCTIONS AFTER JOINT  REPLACEMENT   Remove items at home which could result in a fall. This includes throw rugs or furniture in walking pathways ICE to the affected joint every three hours while awake for 30 minutes at a time, for at least the first 3-5 days, and then as needed for pain and swelling.  Continue to use ice for pain and swelling. You may notice swelling that will progress down to the foot and ankle.  This is normal after surgery.  Elevate your leg when you are not up walking on it.   Continue to use the breathing machine you got in the hospital (incentive spirometer) which will help keep your temperature down.  It is common for your temperature to cycle up and down following surgery, especially at night when you are not up moving around and exerting yourself.  The breathing machine keeps your lungs expanded and your temperature down.   DIET:  As you were doing prior to hospitalization, we recommend a well-balanced diet.  DRESSING / WOUND CARE / SHOWERING  You may shower 3 days after surgery, but keep the wounds dry during showering.  You may use an occlusive plastic wrap (Press'n Seal for example), NO SOAKING/SUBMERGING IN THE BATHTUB.  If the bandage gets wet, change with a clean dry gauze.  If the incision gets wet, pat the wound dry with a clean towel.  ACTIVITY  Increase activity slowly as tolerated, but follow the weight bearing instructions below.   No driving for 6 weeks or until further direction given by your physician.  You cannot drive while taking narcotics.  No lifting or carrying greater than 10 lbs. until further directed by your surgeon. Avoid periods of inactivity such as sitting longer than  an hour when not asleep. This helps prevent blood clots.  You may return to work once you are authorized by your doctor.     WEIGHT BEARING   Weight bearing as tolerated with assist device (walker, cane, etc) as directed, use it as long as suggested by your surgeon or therapist, typically at  least 4-6 weeks.   EXERCISES  Results after joint replacement surgery are often greatly improved when you follow the exercise, range of motion and muscle strengthening exercises prescribed by your doctor. Safety measures are also important to protect the joint from further injury. Any time any of these exercises cause you to have increased pain or swelling, decrease what you are doing until you are comfortable again and then slowly increase them. If you have problems or questions, call your caregiver or physical therapist for advice.   Rehabilitation is important following a joint replacement. After just a few days of immobilization, the muscles of the leg can become weakened and shrink (atrophy).  These exercises are designed to build up the tone and strength of the thigh and leg muscles and to improve motion. Often times heat used for twenty to thirty minutes before working out will loosen up your tissues and help with improving the range of motion but do not use heat for the first two weeks following surgery (sometimes heat can increase post-operative swelling).   These exercises can be done on a training (exercise) mat, on the floor, on a table or on a bed. Use whatever works the best and is most comfortable for you.    Use music or television while you are exercising so that the exercises are a pleasant break in your day. This will make your life better with the exercises acting as a break in your routine that you can look forward to.   Perform all exercises about fifteen times, three times per day or as directed.  You should exercise both the operative leg and the other leg as well.   Exercises include:   Quad Sets - Tighten up the muscle on the front of the thigh (Quad) and hold for 5-10 seconds.   Straight Leg Raises - With your knee straight (if you were given a brace, keep it on), lift the leg to 60 degrees, hold for 3 seconds, and slowly lower the leg.  Perform this exercise against  resistance later as your leg gets stronger.  Leg Slides: Lying on your back, slowly slide your foot toward your buttocks, bending your knee up off the floor (only go as far as is comfortable). Then slowly slide your foot back down until your leg is flat on the floor again.  Angel Wings: Lying on your back spread your legs to the side as far apart as you can without causing discomfort.  Hamstring Strength:  Lying on your back, push your heel against the floor with your leg straight by tightening up the muscles of your buttocks.  Repeat, but this time bend your knee to a comfortable angle, and push your heel against the floor.  You may put a pillow under the heel to make it more comfortable if necessary.   A rehabilitation program following joint replacement surgery can speed recovery and prevent re-injury in the future due to weakened muscles. Contact your doctor or a physical therapist for more information on knee rehabilitation.    CONSTIPATION  Constipation is defined medically as fewer than three stools per week and severe constipation as less than one stool  per week.  Even if you have a regular bowel pattern at home, your normal regimen is likely to be disrupted due to multiple reasons following surgery.  Combination of anesthesia, postoperative narcotics, change in appetite and fluid intake all can affect your bowels.   YOU MUST use at least one of the following options; they are listed in order of increasing strength to get the job done.  They are all available over the counter, and you may need to use some, POSSIBLY even all of these options:    Drink plenty of fluids (prune juice may be helpful) and high fiber foods Colace 100 mg by mouth twice a day  Senokot for constipation as directed and as needed Dulcolax (bisacodyl), take with full glass of water  Miralax (polyethylene glycol) once or twice a day as needed.  If you have tried all these things and are unable to have a bowel movement  in the first 3-4 days after surgery call either your surgeon or your primary doctor.    If you experience loose stools or diarrhea, hold the medications until you stool forms back up.  If your symptoms do not get better within 1 week or if they get worse, check with your doctor.  If you experience "the worst abdominal pain ever" or develop nausea or vomiting, please contact the office immediately for further recommendations for treatment.   ITCHING:  If you experience itching with your medications, try taking only a single pain pill, or even half a pain pill at a time.  You can also use Benadryl over the counter for itching or also to help with sleep.   TED HOSE STOCKINGS:  Use stockings on both legs until for at least 2 weeks or as directed by physician office. They may be removed at night for sleeping.  MEDICATIONS:  See your medication summary on the "After Visit Summary" that nursing will review with you.  You may have some home medications which will be placed on hold until you complete the course of blood thinner medication.  It is important for you to complete the blood thinner medication as prescribed.  PRECAUTIONS:  If you experience chest pain or shortness of breath - call 911 immediately for transfer to the hospital emergency department.   If you develop a fever greater that 101 F, purulent drainage from wound, increased redness or drainage from wound, foul odor from the wound/dressing, or calf pain - CONTACT YOUR SURGEON.                                                   FOLLOW-UP APPOINTMENTS:  If you do not already have a post-op appointment, please call the office for an appointment to be seen by your surgeon.  Guidelines for how soon to be seen are listed in your "After Visit Summary", but are typically between 1-4 weeks after surgery.  OTHER INSTRUCTIONS:   Knee Replacement:  Do not place pillow under knee, focus on keeping the knee straight while resting. CPM instructions: 0-90  degrees, 2 hours in the morning, 2 hours in the afternoon, and 2 hours in the evening. Place foam block, curve side up under heel at all times except when in CPM or when walking.  DO NOT modify, tear, cut, or change the foam block in any way.  MAKE SURE YOU:  Understand these instructions.  Get help right away if you are not doing well or get worse.    Thank you for letting us be a part of your medical care team.  It is a privilege we respect greatly.  We hope these instructions will help you stay on track for a fast and full recovery!   Increase activity slowly as tolerated   Complete by:  As directed       Follow-up Information    Melrose Nakayama, MD. Schedule an appointment as soon as possible for a visit in 2 weeks.   Specialty:  Orthopedic Surgery Contact information: [place] [address] [phone]        [facility] Follow up.   Specialty:  [place] Why:  A representative from [facility] will contact you to arrange start date and time for your therapy.  Contact information: [address] [place] [address] [phone]            Signed: Rich Fuchs 05/27/2018, 8:09 AM

## 2018-05-27 NOTE — Progress Notes (Signed)
Physical Therapy Treatment Patient Details Name: [name] [name] MRN: [id number] DOB: 25-Dec-1945 Today's Date: 05/27/2018    History of Present Illness Pt is a 72 y/o male s/p elective L TKA. PMH includes CAD s/p stent placement, NSTEMI, DM, CKD, and nonhodgkin's lymphoma.     PT Comments    Patient seen for mobility progression. Pt is making gradual progress toward PT goals and able to ambulate in room with min A +2 and RW. Pt continues to need mod A +2 for bed mobility and max A +2 for sit to stand transfers. Pt's girlfriend present during session. Continue to progress as tolerated.   Follow Up Recommendations  Follow surgeon's recommendation for DC plan and follow-up therapies;Supervision for mobility/OOB     Equipment Recommendations  None recommended by PT    Recommendations for Other Services OT consult     Precautions / Restrictions Precautions Precautions: Fall;Knee Precaution Comments: Reviewed knee precautions and positioning  Restrictions Weight Bearing Restrictions: Yes LLE Weight Bearing: Weight bearing as tolerated    Mobility  Bed Mobility Overal bed mobility: Needs Assistance Bed Mobility: Supine to Sit     Supine to sit: Mod assist;+2 for physical assistance;HOB elevated     General bed mobility comments: assistance to bring L LE and hips to EOB and to elevate trunk into sitting  Transfers Overall transfer level: Needs assistance Equipment used: Rolling walker (2 wheeled) Transfers: Sit to/from Stand Sit to Stand: Max assist;+2 physical assistance;From elevated surface(rocking technique utilized for [employer])         General transfer comment: +2 to power up into standing using momentum and bed height elevated; cues for safe hand placement and positioningn prior to attempt to stand   Ambulation/Gait Ambulation/Gait assistance: Min assist;+2 safety/equipment Gait Distance (Feet): 12 Feet Assistive device: Rolling walker (2 wheeled) Gait  Pattern/deviations: Step-to pattern;Decreased stance time - left;Decreased step length - right;Decreased weight shift to left;Trunk flexed Gait velocity: Decreased   General Gait Details: max vc for posture, sequencing, proximity to RW, and use of bilat UE to offload L LE; improving step through pattern and L LE weight bearing    Stairs             Wheelchair Mobility    Modified Rankin (Stroke Patients Only)       Balance Overall balance assessment: Needs assistance Sitting-balance support: Feet supported;Bilateral upper extremity supported Sitting balance-Leahy Scale: Fair     Standing balance support: Bilateral upper extremity supported;During functional activity Standing balance-Leahy Scale: Poor Standing balance comment: Reliant on BUE support.                             Cognition Arousal/Alertness: Awake/alert Behavior During Therapy: WFL for tasks assessed/performed;Flat affect Overall Cognitive Status: Within Functional Limits for tasks assessed                                        Exercises      General Comments General comments (skin integrity, edema, etc.): girlfriend present      Pertinent Vitals/Pain Pain Assessment: Faces Faces Pain Scale: Hurts whole lot Pain Location: Lft knee  Pain Descriptors / Indicators: Operative site guarding;Moaning;Sharp;Aching Pain Intervention(s): Limited activity within patient's tolerance;Monitored during session;Premedicated before session;Repositioned    Home Living  Prior Function            PT Goals (current goals can now be found in the care plan section) Progress towards PT goals: Progressing toward goals    Frequency    7X/week      PT Plan Current plan remains appropriate    Co-evaluation              AM-PAC PT "6 Clicks" Daily Activity  Outcome Measure  Difficulty turning over in bed (including adjusting bedclothes, sheets  and blankets)?: Unable Difficulty moving from lying on back to sitting on the side of the bed? : Unable Difficulty sitting down on and standing up from a chair with arms (e.g., wheelchair, bedside commode, etc,.)?: Unable Help needed moving to and from a bed to chair (including a wheelchair)?: A Lot Help needed walking in hospital room?: A Lot Help needed climbing 3-5 steps with a railing? : Total 6 Click Score: 8    End of Session Equipment Utilized During Treatment: Gait belt Activity Tolerance: Patient limited by pain Patient left: with call bell/phone within reach;with family/visitor present;in chair Nurse Communication: Mobility status PT Visit Diagnosis: Other abnormalities of gait and mobility (R26.89);Pain Pain - Right/Left: Left Pain - part of body: Knee     Time: 0712-1975 PT Time Calculation (min) (ACUTE ONLY): 28 min  Charges:  $Gait Training: 8-22 mins $Therapeutic Activity: 8-22 mins                     Earney Navy, PTA Acute Rehabilitation Services Pager: [phone] Office: [phone]     Darliss Cheney 05/27/2018, 11:36 AM

## 2018-05-27 NOTE — Progress Notes (Signed)
CSW spoke with [facility] who has made a bed offer.   [facility] will start insurance authorization today, hopeful of d/c Monday.   [place], [place]

## 2018-05-28 ENCOUNTER — Inpatient Hospital Stay
Admission: RE | Admit: 2018-05-28 | Discharge: 2018-06-11 | Disposition: A | Discharge status: Home / Self Care | Payer: Medicare HMO | Source: Ambulatory Visit | Attending: Internal Medicine | Admitting: Internal Medicine

## 2018-05-28 ENCOUNTER — Encounter (HOSPITAL_COMMUNITY)
Admission: RE | Admit: 2018-05-28 | Discharge: 2018-05-28 | Disposition: A | Discharge status: Home / Self Care | Payer: Medicare HMO | Source: Skilled Nursing Facility | Attending: Internal Medicine | Admitting: Internal Medicine

## 2018-05-28 DIAGNOSIS — I130 Hypertensive heart and chronic kidney disease with heart failure and stage 1 through stage 4 chronic kidney disease, or unspecified chronic kidney disease: Secondary | ICD-10-CM | POA: Insufficient documentation

## 2018-05-28 LAB — CBC WITH DIFFERENTIAL/PLATELET
Abs Immature Granulocytes: 0.03 10*3/uL (ref 0.00–0.07)
BASOS PCT: 1 %
Basophils Absolute: 0.1 10*3/uL (ref 0.0–0.1)
EOS ABS: 0.4 10*3/uL (ref 0.0–0.5)
Eosinophils Relative: 5 %
HCT: 39.8 % (ref 39.0–52.0)
Hemoglobin: 12.8 g/dL — ABNORMAL LOW (ref 13.0–17.0)
Immature Granulocytes: 0 %
Lymphocytes Relative: 16 %
Lymphs Abs: 1.4 10*3/uL (ref 0.7–4.0)
MCH: 27.9 pg (ref 26.0–34.0)
MCHC: 32.2 g/dL (ref 30.0–36.0)
MCV: 86.7 fL (ref 80.0–100.0)
MONO ABS: 0.7 10*3/uL (ref 0.1–1.0)
MONOS PCT: 8 %
NEUTROS PCT: 70 %
Neutro Abs: 5.9 10*3/uL (ref 1.7–7.7)
PLATELETS: 163 10*3/uL (ref 150–400)
RBC: 4.59 MIL/uL (ref 4.22–5.81)
RDW: 15.6 % — AB (ref 11.5–15.5)
WBC: 8.6 10*3/uL (ref 4.0–10.5)
nRBC: 0 % (ref 0.0–0.2)

## 2018-05-28 LAB — BASIC METABOLIC PANEL
ANION GAP: 11 (ref 5–15)
BUN: 51 mg/dL — ABNORMAL HIGH (ref 8–23)
CALCIUM: 8.6 mg/dL — AB (ref 8.9–10.3)
CO2: 26 mmol/L (ref 22–32)
Chloride: 101 mmol/L (ref 98–111)
Creatinine, Ser: 2.83 mg/dL — ABNORMAL HIGH (ref 0.61–1.24)
GFR calc Af Amer: 24 mL/min — ABNORMAL LOW (ref >60)
GFR, EST NON AFRICAN AMERICAN: 21 mL/min — AB (ref >60)
GLUCOSE: 96 mg/dL (ref 70–99)
Potassium: 3.3 mmol/L — ABNORMAL LOW (ref 3.5–5.1)
SODIUM: 138 mmol/L (ref 135–145)

## 2018-05-30 ENCOUNTER — Encounter (HOSPITAL_COMMUNITY)
Admission: RE | Admit: 2018-05-30 | Discharge: 2018-05-30 | Disposition: A | Discharge status: Home / Self Care | Payer: Medicare HMO | Source: Skilled Nursing Facility

## 2018-05-30 ENCOUNTER — Non-Acute Institutional Stay (SKILLED_NURSING_FACILITY): Payer: Medicare HMO | Admitting: Internal Medicine

## 2018-05-30 ENCOUNTER — Encounter: Payer: Self-pay | Admitting: Internal Medicine

## 2018-05-30 DIAGNOSIS — E1122 Type 2 diabetes mellitus with diabetic chronic kidney disease: Secondary | ICD-10-CM

## 2018-05-30 DIAGNOSIS — E038 Other specified hypothyroidism: Secondary | ICD-10-CM

## 2018-05-30 DIAGNOSIS — I2510 Atherosclerotic heart disease of native coronary artery without angina pectoris: Secondary | ICD-10-CM

## 2018-05-30 DIAGNOSIS — I10 Essential (primary) hypertension: Secondary | ICD-10-CM

## 2018-05-30 DIAGNOSIS — Z96652 Presence of left artificial knee joint: Secondary | ICD-10-CM

## 2018-05-30 DIAGNOSIS — N184 Chronic kidney disease, stage 4 (severe): Secondary | ICD-10-CM

## 2018-05-30 DIAGNOSIS — Z794 Long term (current) use of insulin: Secondary | ICD-10-CM

## 2018-05-30 LAB — BASIC METABOLIC PANEL
ANION GAP: 12 (ref 5–15)
BUN: 63 mg/dL — ABNORMAL HIGH (ref 8–23)
CHLORIDE: 100 mmol/L (ref 98–111)
CO2: 24 mmol/L (ref 22–32)
Calcium: 8.7 mg/dL — ABNORMAL LOW (ref 8.9–10.3)
Creatinine, Ser: 2.91 mg/dL — ABNORMAL HIGH (ref 0.61–1.24)
GFR calc Af Amer: 23 mL/min — ABNORMAL LOW (ref >60)
GFR calc non Af Amer: 20 mL/min — ABNORMAL LOW (ref >60)
Glucose, Bld: 111 mg/dL — ABNORMAL HIGH (ref 70–99)
POTASSIUM: 3.3 mmol/L — AB (ref 3.5–5.1)
Sodium: 136 mmol/L (ref 135–145)

## 2018-05-30 NOTE — Progress Notes (Signed)
Provider:  Veleta Miners MD Location:    [place] Room Number: 151/P Place of Service:  SNF (31)  PCP: [name] Patient Care Team: [name] as PCP - General (Family Medicine) Satira Sark, MD as PCP - Cardiology (Cardiology)  Extended Emergency Contact Information Primary Emergency Contact: Jessup,Jennifer Address: [place], [place] Phone: [phone] Relation: Daughter Secondary Emergency Contact: Veatrice Bourbon Address: [address]           [place], [place] [address] Johnnette Litter of [place] Phone: [phone] Relation: Friend  Code Status: Full Code Goals of Care: Advanced Directive information Advanced Directives 05/30/2018  Does Patient Have a Medical Advance Directive? Yes  Type of Advance Directive (No Data)  Does patient want to make changes to medical advance directive? No - Patient declined  Would patient like information on creating a medical advance directive? No - Patient declined  Pre-existing out of facility DNR order (yellow form or pink MOST form) -      Chief Complaint  Patient presents with  . New Admit To SNF    New Admission Visit    HPI: Patient is a 72 y.o. male seen today for admission to SNF for therapy after staying in the hospital from 10/22-10-25 For Left Knee Replacement.  He has h/o Hypertension, CKD stage 4, Type 2 Diabetes, Hypothyroidism, CAD Hyperlipidemia,COPD , Obesity and Arthritis. Patient was admitted for Left Knee Replacement . He had Uneventful Post op. Since he has been here his main complain has been Pain control. Nurse gave him 2 tabs of Hydrocodone and Muscle relaxant and he was very Sleepy and refused to do therapy. He denied any other complain. He lives with his girlfriend. Was driving and walks with the cane. His right knee also has some issues since he says he did not do  good therapy after replacement.     Past Medical History:  Diagnosis Date  . CAD (coronary artery disease)    DES to LAD June 2018  . CKD (chronic kidney disease) stage 3, GFR 30-59 ml/min (HCC)   . Complication of anesthesia    patient had an epidural with his right knee and it took him 6 hours before he could move, he does not want another one  . Essential hypertension   . History of pneumonia   . Hyperlipidemia   . Hypothyroidism   . LBBB (left bundle branch block)   . Morbid obesity ([place])   . Non Hodgkin's lymphoma ([place])    Status post XRT and chemotherapy  . NSTEMI (non-ST elevated myocardial infarction) [facility])    June 2018  . Peripheral neuropathy   . Sleep apnea   . Type 2 diabetes mellitus ([place])    Past Surgical History:  Procedure Laterality Date  . CHOLECYSTECTOMY  1992  . COLONOSCOPY N/A 09/03/2014   SLF:six colon polyps removed/small internal hemorrhoids  . CORONARY STENT INTERVENTION N/A 01/21/2017   Procedure: Coronary Stent Intervention;  Surgeon: Nelva Bush, MD;  Location: [facility];  Service: Cardiovascular;  Laterality: N/A;  . ESOPHAGOGASTRODUODENOSCOPY N/A 09/03/2014   SLF: mild gastritis/few gastric polyps  . LEFT HEART CATH AND CORONARY ANGIOGRAPHY N/A 01/20/2017   Procedure: Left Heart Cath and Coronary Angiography;  Surgeon: Jettie Booze, MD;  Location: [facility];  Service: Cardiovascular;  Laterality: N/A;  . TOTAL KNEE  ARTHROPLASTY  11/10/2011   Procedure: TOTAL KNEE ARTHROPLASTY;  Surgeon: Mauri Pole, MD;  Location: [facility];  Service: Orthopedics;  Laterality: Right;  . TOTAL KNEE ARTHROPLASTY Left 05/24/2018   Procedure: LEFT TOTAL KNEE ARTHROPLASTY;  Surgeon: Melrose Nakayama, MD;  Location: [facility];  Service: Orthopedics;  Laterality: Left;    reports that he has been smoking cigarettes. He has a 7.50 pack-year smoking history. He has never used smokeless tobacco. He reports that he does not drink alcohol or use  drugs. Social History   Socioeconomic History  . Marital status: Divorced    Spouse name: Not on file  . Number of children: Not on file  . Years of education: Not on file  . Highest education level: Not on file  Occupational History  . Not on file  Social Needs  . Financial resource strain: Not on file  . Food insecurity:    Worry: Not on file    Inability: Not on file  . Transportation needs:    Medical: Not on file    Non-medical: Not on file  Tobacco Use  . Smoking status: Current Some Day Smoker    Packs/day: 0.25    Years: 30.00    Pack years: 7.50    Types: Cigarettes    Last attempt to quit: 08/04/2007    Years since quitting: 10.8  . Smokeless tobacco: Never Used  . Tobacco comment: "smoke a cigarette every now and then"  Substance and Sexual Activity  . Alcohol use: No    Alcohol/week: 0.0 standard drinks  . Drug use: No  . Sexual activity: Yes  Lifestyle  . Physical activity:    Days per week: Not on file    Minutes per session: Not on file  . Stress: Not on file  Relationships  . Social connections:    Talks on phone: Not on file    Gets together: Not on file    Attends religious service: Not on file    Active member of club or organization: Not on file    Attends meetings of clubs or organizations: Not on file    Relationship status: Not on file  . Intimate partner violence:    Fear of current or ex partner: Not on file    Emotionally abused: Not on file    Physically abused: Not on file    Forced sexual activity: Not on file  Other Topics Concern  . Not on file  Social History Narrative  . Not on file    Functional Status Survey:    Family History  Problem Relation Age of Onset  . Cancer Mother        breast and lung  . Cancer Father        bladder  . Cancer Maternal Uncle        prostate  . Cancer Paternal Uncle        esophagus  . Colon cancer Neg Hx     Health Maintenance  Topic Date Due  . URINE MICROALBUMIN  04/17/1956  .  FOOT EXAM  06/30/2018 (Originally 04/17/1956)  . OPHTHALMOLOGY EXAM  06/30/2018 (Originally 04/17/1956)  . TETANUS/TDAP  06/30/2018 (Originally 04/17/1966)  . PNA vac Low Risk Adult (2 of 2 - PCV13) 06/30/2018 (Originally 05/18/2015)  . HEMOGLOBIN A1C  11/12/2018  . COLONOSCOPY  09/03/2024  . Hepatitis C Screening  Completed    No Known Allergies  Allergies as of 05/30/2018   No Known Allergies  Medication List    Notice   This visit is during an admission. Changes to the med list made in this visit will be reflected in the After Visit Summary of the admission.     Review of Systems  Constitutional: Negative.   HENT: Negative.   Respiratory: Negative.   Cardiovascular: Negative.   Gastrointestinal: Negative.   Genitourinary: Negative.   Musculoskeletal: Positive for arthralgias.  Neurological: Negative.   Psychiatric/Behavioral: Negative.     Vitals:   06/01/18 0907  BP: (!) 111/56  Pulse: (!) 52  Resp: 16  Temp: 98.4 F (36.9 C)  TempSrc: Oral   There is no height or weight on file to calculate BMI. Physical Exam  Constitutional: He is oriented to person, place, and time. He appears well-developed and well-nourished.  HENT:  Head: Normocephalic.  Mouth/Throat: Oropharynx is clear and moist.  Eyes: Pupils are equal, round, and reactive to light.  Neck: Neck supple.  Cardiovascular: Normal rate and regular rhythm.  Pulmonary/Chest: Effort normal and breath sounds normal.  Abdominal: Soft. Bowel sounds are normal. He exhibits no distension. There is no tenderness. There is no guarding.  Musculoskeletal: He exhibits no edema.  Neurological: He is alert and oriented to person, place, and time.  Patient was sleepy but otherwise had no focal deficits.  Skin: Skin is warm and dry.  Psychiatric: He has a normal mood and affect. His behavior is normal. Thought content normal.    Labs reviewed: Basic Metabolic Panel: Recent Labs    05/27/18 0341 05/28/18 0830  05/30/18 0415  NA 135 138 136  K 3.2* 3.3* 3.3*  CL 100 101 100  CO2 25 26 24   GLUCOSE 122* 96 111*  BUN 44* 51* 63*  CREATININE 2.91* 2.83* 2.91*  CALCIUM 8.9 8.6* 8.7*   Liver Function Tests: Recent Labs    08/16/17 0920 09/03/17 0838 02/25/18 1101  AST 15 16 16   ALT 10* 12* 12  ALKPHOS 73 82 65  BILITOT 1.3* 1.1 1.3*  PROT 7.2 7.0 7.6  ALBUMIN 3.5 3.4* 4.0   No results for input(s): LIPASE, AMYLASE in the last 8760 hours. No results for input(s): AMMONIA in the last 8760 hours. CBC: Recent Labs    05/13/18 0926 05/26/18 0313 05/27/18 0341 05/28/18 0830  WBC 7.4 8.9 9.1 8.6  NEUTROABS 4.9 6.4  --  5.9  HGB 15.3 13.0 13.0 12.8*  HCT 46.0 40.0 38.8* 39.8  MCV 85.8 84.4 84.7 86.7  PLT 162 137* 157 163   Cardiac Enzymes: No results for input(s): CKTOTAL, CKMB, CKMBINDEX, TROPONINI in the last 8760 hours. BNP: Invalid input(s): POCBNP Lab Results  Component Value Date   HGBA1C 4.9 05/13/2018   Lab Results  Component Value Date   TSH 0.670 03/21/2017   No results found for: VITAMINB12 No results found for: FOLATE Lab Results  Component Value Date   FERRITIN 1,174 (H) 07/24/2014    Imaging and Procedures obtained prior to SNF admission: No results found.  Assessment/Plan Essential hypertension BP controlled on Losartan and Bisoprolol with Lasix   CAD Continue On Plavix Also on Nitrate and Aspirin  Type 2 Diabetes mellitus  On Insulin BS less then 150 A1C was 4.9 On Treshiba  Hypothyroidism TSH  Normal Limits Same dose of Synthyroid  S/P total knee replacement, left WBAT On aspirin per ortho Discontinue Muscle relaxant and Norco Will start on Tylenol Q 6 hours and Oxycodone 5-10 mg Prn for Pain Restart Therapy. Follow with Ortho  CKD stage 4  Slight Worsening of Creat Continue to follow Potassium Repelted. Hyperlipidemia On Zocor  Family/ staff Communication:   Labs/tests ordered:  Total time spent in this patient care  encounter was 45_ minutes; greater than 50% of the visit spent counseling patient, reviewing records , Labs and coordinating care for problems addressed at this encounter.

## 2018-06-01 ENCOUNTER — Encounter: Payer: Self-pay | Admitting: Internal Medicine

## 2018-06-06 ENCOUNTER — Encounter (HOSPITAL_COMMUNITY)
Admission: RE | Admit: 2018-06-06 | Discharge: 2018-06-06 | Disposition: A | Discharge status: Home / Self Care | Payer: Medicare HMO | Source: Skilled Nursing Facility | Attending: Internal Medicine | Admitting: Internal Medicine

## 2018-06-06 ENCOUNTER — Other Ambulatory Visit: Payer: Self-pay

## 2018-06-06 DIAGNOSIS — I130 Hypertensive heart and chronic kidney disease with heart failure and stage 1 through stage 4 chronic kidney disease, or unspecified chronic kidney disease: Secondary | ICD-10-CM | POA: Insufficient documentation

## 2018-06-06 LAB — COMPREHENSIVE METABOLIC PANEL
ALK PHOS: 217 U/L — AB (ref 38–126)
ALT: 25 U/L (ref 0–44)
AST: 29 U/L (ref 15–41)
Albumin: 3.6 g/dL (ref 3.5–5.0)
Anion gap: 9 (ref 5–15)
BILIRUBIN TOTAL: 1.1 mg/dL (ref 0.3–1.2)
BUN: 51 mg/dL — AB (ref 8–23)
CHLORIDE: 102 mmol/L (ref 98–111)
CO2: 27 mmol/L (ref 22–32)
CREATININE: 2.42 mg/dL — AB (ref 0.61–1.24)
Calcium: 9 mg/dL (ref 8.9–10.3)
GFR, EST AFRICAN AMERICAN: 29 mL/min — AB (ref >60)
GFR, EST NON AFRICAN AMERICAN: 25 mL/min — AB (ref >60)
Glucose, Bld: 109 mg/dL — ABNORMAL HIGH (ref 70–99)
Potassium: 3.7 mmol/L (ref 3.5–5.1)
Sodium: 138 mmol/L (ref 135–145)
TOTAL PROTEIN: 7.6 g/dL (ref 6.5–8.1)

## 2018-06-06 LAB — CBC WITH DIFFERENTIAL/PLATELET
Abs Immature Granulocytes: 0.11 10*3/uL — ABNORMAL HIGH (ref 0.00–0.07)
Basophils Absolute: 0.1 10*3/uL (ref 0.0–0.1)
Basophils Relative: 1 %
Eosinophils Absolute: 0.4 10*3/uL (ref 0.0–0.5)
Eosinophils Relative: 5 %
HCT: 42.3 % (ref 39.0–52.0)
Hemoglobin: 13.4 g/dL (ref 13.0–17.0)
IMMATURE GRANULOCYTES: 2 %
Lymphocytes Relative: 25 %
Lymphs Abs: 1.8 10*3/uL (ref 0.7–4.0)
MCH: 28.2 pg (ref 26.0–34.0)
MCHC: 31.7 g/dL (ref 30.0–36.0)
MCV: 89.1 fL (ref 80.0–100.0)
MONOS PCT: 6 %
Monocytes Absolute: 0.4 10*3/uL (ref 0.1–1.0)
NEUTROS PCT: 61 %
Neutro Abs: 4.5 10*3/uL (ref 1.7–7.7)
PLATELETS: 196 10*3/uL (ref 150–400)
RBC: 4.75 MIL/uL (ref 4.22–5.81)
RDW: 15.1 % (ref 11.5–15.5)
WBC: 7.3 10*3/uL (ref 4.0–10.5)
nRBC: 0.3 % — ABNORMAL HIGH (ref 0.0–0.2)

## 2018-06-06 MED ORDER — OXYCODONE HCL 5 MG PO TABS: 1.0000 mg | ORAL_TABLET | ORAL | 0 refills | Status: DC | PRN

## 2018-06-06 NOTE — Telephone Encounter (Signed)
RX Fax for [facility]@ [phone]  

## 2018-06-10 ENCOUNTER — Non-Acute Institutional Stay (SKILLED_NURSING_FACILITY): Payer: Medicare HMO | Admitting: Internal Medicine

## 2018-06-10 ENCOUNTER — Encounter: Payer: Self-pay | Admitting: Internal Medicine

## 2018-06-10 DIAGNOSIS — N183 Chronic kidney disease, stage 3 unspecified: Secondary | ICD-10-CM

## 2018-06-10 DIAGNOSIS — Z96652 Presence of left artificial knee joint: Secondary | ICD-10-CM

## 2018-06-10 DIAGNOSIS — I10 Essential (primary) hypertension: Secondary | ICD-10-CM

## 2018-06-10 DIAGNOSIS — I2510 Atherosclerotic heart disease of native coronary artery without angina pectoris: Secondary | ICD-10-CM

## 2018-06-10 DIAGNOSIS — N184 Chronic kidney disease, stage 4 (severe): Secondary | ICD-10-CM

## 2018-06-10 DIAGNOSIS — Z794 Long term (current) use of insulin: Secondary | ICD-10-CM

## 2018-06-10 DIAGNOSIS — E1122 Type 2 diabetes mellitus with diabetic chronic kidney disease: Secondary | ICD-10-CM | POA: Diagnosis not present

## 2018-06-10 NOTE — Progress Notes (Signed)
Location:    [facility] Room Number: 151/P Place of Service:  SNF (31)  Provider: Granville Lewis PA-C  PCP: [place], [place] Patient Care Team: [name] as PCP - General (Family Medicine) Satira Sark, MD as PCP - Cardiology (Cardiology)  Extended Emergency Contact Information Primary Emergency Contact: Jessup,Jennifer Address: [place], [place] Phone: [phone] Relation: Daughter Secondary Emergency Contact: Veatrice Bourbon Address: [address]           [place], [place] [address] Johnnette Litter of [place] Phone: [phone] Relation: Friend  Code Status: Full Code Goals of care:  Advanced Directive information Advanced Directives 06/10/2018  Does Patient Have a Medical Advance Directive? Yes  Type of Advance Directive (No Data)  Does patient want to make changes to medical advance directive? No - Patient declined  Would patient like information on creating a medical advance directive? No - Patient declined  Pre-existing out of facility DNR order (yellow form or pink MOST form) -     No Known Allergies  Chief Complaint  Patient presents with  . Discharge Note    Discharge Visit    HPI:  72 y.o. male seen today for discharge from facility.  Has been here for rehab status post total left knee replacement   He has h/o Hypertension, CKD stage 4, Type 2 Diabetes, Hypothyroidism, CAD Hyperlipidemia,COPD as well as obesity and osteoarthritis.  His postop course and rehab course has been fairly unremarkable.  Initially when he came here apparently pain was an issue but this appears to be under better control per patient continues on oxycodone Tylenol as needed.  He is weightbearing as tolerated aspirin for DVT prophylaxis.  .  Patient lives with his significant other's previously was driving and walking with a cane- he will be  going home with his significant oth and will require continued PT and OT.  His other medical issues appear to be stable he does have a history of type 2 diabetes is on Tresiba blood sugars a  Run from the 90s to low 100.  Regards to hypertension he is on losartan bisoprolol as well as Norvasc and Lasix- I got a blood pressure of 120/74 today-I see an earlier reading of 109 systolically but this does not appear to be the usual previous readings 130/63 111/56 I see one listed as 92/44 but this does not appear to be the norm I suspect it was a machine variation.  Pulses at times do run in the 50s it appears this is baseline per patient and is nothing new and well-tolerated.--Her EKG done last month appears he does have a history of sinus bradycardia with first-degree AV block.  He has no complaints today again he appears to be doing much better with his pain control- he does have a history of coronary artery disease but denies any chest pain he is on Lasix nitro as needed as well as aspirin he is also on a statin-.  Regards to COPD continues on his inhalers and apparently is tolerating this well.  .   .       Past Medical History:  Diagnosis Date  . CAD (coronary artery disease)    DES to LAD June 2018  . CKD (chronic kidney disease) stage 3, GFR 30-59 ml/min (HCC)   . Complication of anesthesia    patient had an epidural  with his right knee and it took him 6 hours before he could move, he does not want another one  . Essential hypertension   . History of pneumonia   . Hyperlipidemia   . Hypothyroidism   . LBBB (left bundle branch block)   . Morbid obesity ([facility])   . Non Hodgkin's lymphoma ([facility])    Status post XRT and chemotherapy  . NSTEMI (non-ST elevated myocardial infarction) [facility])    June 2018  . Peripheral neuropathy   . Sleep apnea   . Type 2 diabetes mellitus ([facility])     Past Surgical History:  Procedure Laterality Date  . CHOLECYSTECTOMY  1992  . COLONOSCOPY N/A  09/03/2014   SLF:six colon polyps removed/small internal hemorrhoids  . CORONARY STENT INTERVENTION N/A 01/21/2017   Procedure: Coronary Stent Intervention;  Surgeon: Nelva Bush, MD;  Location: [facility];  Service: Cardiovascular;  Laterality: N/A;  . ESOPHAGOGASTRODUODENOSCOPY N/A 09/03/2014   SLF: mild gastritis/few gastric polyps  . LEFT HEART CATH AND CORONARY ANGIOGRAPHY N/A 01/20/2017   Procedure: Left Heart Cath and Coronary Angiography;  Surgeon: Jettie Booze, MD;  Location: [facility];  Service: Cardiovascular;  Laterality: N/A;  . TOTAL KNEE ARTHROPLASTY  11/10/2011   Procedure: TOTAL KNEE ARTHROPLASTY;  Surgeon: Mauri Pole, MD;  Location: [facility];  Service: Orthopedics;  Laterality: Right;  . TOTAL KNEE ARTHROPLASTY Left 05/24/2018   Procedure: LEFT TOTAL KNEE ARTHROPLASTY;  Surgeon: Melrose Nakayama, MD;  Location: [facility];  Service: Orthopedics;  Laterality: Left;      reports that he has been smoking cigarettes. He has a 7.50 pack-year smoking history. He has never used smokeless tobacco. He reports that he does not drink alcohol or use drugs. Social History   Socioeconomic History  . Marital status: Divorced    Spouse name: Not on file  . Number of children: Not on file  . Years of education: Not on file  . Highest education level: Not on file  Occupational History  . Not on file  Social Needs  . Financial resource strain: Not on file  . Food insecurity:    Worry: Not on file    Inability: Not on file  . Transportation needs:    Medical: Not on file    Non-medical: Not on file  Tobacco Use  . Smoking status: Current Some Day Smoker    Packs/day: 0.25    Years: 30.00    Pack years: 7.50    Types: Cigarettes    Last attempt to quit: 08/04/2007    Years since quitting: 10.8  . Smokeless tobacco: Never Used  . Tobacco comment: "smoke a cigarette every now and then"  Substance and Sexual Activity  . Alcohol use: No    Alcohol/week: 0.0  standard drinks  . Drug use: No  . Sexual activity: Yes  Lifestyle  . Physical activity:    Days per week: Not on file    Minutes per session: Not on file  . Stress: Not on file  Relationships  . Social connections:    Talks on phone: Not on file    Gets together: Not on file    Attends religious service: Not on file    Active member of club or organization: Not on file    Attends meetings of clubs or organizations: Not on file    Relationship status: Not on file  . Intimate partner violence:    Fear of current or ex partner: Not on  file    Emotionally abused: Not on file    Physically abused: Not on file    Forced sexual activity: Not on file  Other Topics Concern  . Not on file  Social History Narrative  . Not on file   Functional Status Survey:    No Known Allergies  Pertinent  Health Maintenance Due  Topic Date Due  . FOOT EXAM  06/30/2018 (Originally 04/17/1956)  . OPHTHALMOLOGY EXAM  06/30/2018 (Originally 04/17/1956)  . PNA vac Low Risk Adult (2 of 2 - PCV13) 06/30/2018 (Originally 05/18/2015)  . URINE MICROALBUMIN  07/10/2018 (Originally 04/17/1956)  . HEMOGLOBIN A1C  11/12/2018  . COLONOSCOPY  09/03/2024    Medications: Outpatient Encounter Medications as of 06/10/2018  Medication Sig  . acetaminophen (TYLENOL) 325 MG tablet Take 650 mg by mouth every 6 (six) hours as needed.  Marland Kitchen albuterol (PROVENTIL HFA;VENTOLIN HFA) 108 (90 Base) MCG/ACT inhaler Inhale 2 puffs into the lungs every 6 (six) hours as needed for wheezing or shortness of breath.  Marland Kitchen amLODipine (NORVASC) 10 MG tablet Take 10 mg by mouth daily.  Marland Kitchen aspirin EC 325 MG EC tablet Take 1 tablet (325 mg total) by mouth 2 (two) times daily after a meal.  . bisacodyl (DULCOLAX) 5 MG EC tablet Take 1 tablet (5 mg total) by mouth daily as needed for moderate constipation.  . bisoprolol (ZEBETA) 5 MG tablet Take 1 tablet (5 mg total) by mouth daily.  . budesonide-formoterol (SYMBICORT) 80-4.5 MCG/ACT inhaler  Inhale 2 puffs into the lungs daily as needed (shortness of breath).   . clopidogrel (PLAVIX) 75 MG tablet Take 1 tablet (75 mg total) by mouth daily.  Marland Kitchen docusate sodium (COLACE) 100 MG capsule Take 1 capsule (100 mg total) by mouth 2 (two) times daily.  . furosemide (LASIX) 40 MG tablet Take 40 mg by mouth daily.  . insulin degludec (TRESIBA FLEXTOUCH) 100 UNIT/ML SOPN FlexTouch Pen Inject 0.5 mLs (50 Units total) into the skin daily at 10 pm.  . isosorbide mononitrate (IMDUR) 30 MG 24 hr tablet Take 1 tablet (30 mg total) by mouth daily.  Marland Kitchen levocetirizine (XYZAL) 5 MG tablet Take 5 mg by mouth every evening.   Marland Kitchen levothyroxine (SYNTHROID, LEVOTHROID) 200 MCG tablet Take 200 mcg by mouth daily before breakfast.  . losartan-hydrochlorothiazide (HYZAAR) 100-25 MG tablet Take 1 tablet by mouth daily.  . montelukast (SINGULAIR) 10 MG tablet Take 10 mg by mouth at bedtime.   . nitroGLYCERIN (NITROSTAT) 0.4 MG SL tablet Place 0.4 mg under the tongue every 5 (five) minutes as needed for chest pain.   Marland Kitchen oxyCODONE (OXY IR/ROXICODONE) 5 MG immediate release tablet Take 10 mg by mouth every 4 (four) hours as needed for severe pain.  Marland Kitchen oxyCODONE (OXY IR/ROXICODONE) 5 MG immediate release tablet Take 5 mg by mouth every 4 (four) hours as needed for severe pain.  . potassium chloride SA (K-DUR,KLOR-CON) 20 MEQ tablet Take 20 mEq by mouth daily.  . simvastatin (ZOCOR) 5 MG tablet Take 5 mg by mouth daily.  . Sodium Chloride-Sodium Bicarb (NETI POT SINUS WASH) 2300-700 MG KIT Place 1 Dose into the nose 2 (two) times daily.  . traZODone (DESYREL) 50 MG tablet Take 50 mg by mouth at bedtime.   . [DISCONTINUED] HYDROcodone-acetaminophen (NORCO) 7.5-325 MG tablet Take 1-2 tablets by mouth every 6 (six) hours as needed for moderate pain or severe pain.  . [DISCONTINUED] oxyCODONE (OXY IR/ROXICODONE) 5 MG immediate release tablet Take 0.5 tablets (2.5 mg total)  by mouth every 4 (four) hours as needed for moderate pain  or severe pain.  . [DISCONTINUED] tiZANidine (ZANAFLEX) 4 MG tablet Take 1 tablet (4 mg total) by mouth every 6 (six) hours as needed.   No facility-administered encounter medications on file as of 06/10/2018.      Review of Systems   General is not complaining of any fever or chills his weight appears to be relatively stable.  Skin does not complain of rashes or itching surgical site appears to be healing unremarkably.  Head ears eyes nose mouth and throat is not complain of visual changes or sore throat.  Respiratory does not complain of shortness of breath or cough.  Cardiac does not complain of chest pain palpitations has some mild lower extremity edema left greater than right.  .  GI is not complaining of abdominal pain nausea vomiting diarrhea constipation.  GU does not complain of dysuria.  Musculoskeletal says his knee discomfort joint discomfort is well controlled.  Neurologic does not complain of dizziness headache or syncope at this time.  Psych does not complain of feeling depressed or anxious    Vitals:   06/10/18 1401  BP: (!) 159/74  Pulse: 76  Resp: 18  Temp: 98 F (36.7 C)  TempSrc: Oral  SpO2: 99%  There is no height or weight on file to calculate BMI. Physical Exam  Manual blood pressure was 120/74 pulse was 52 weight is 297.5 pounds appears to be relatively stable.  In general this is a pleasant elderly male in no distress lying comfortably in bed.  His skin is warm and dry.  Surgical site left knee has well-healed scabbing-I did not really see any concerning erythema drainage or bleeding.  He has a well-healed surgical scar on the right knee.  Visual acuity is intact sclera and conjunctive are clear.  Oropharynx is clear mucous membranes moist.  Clear to auscultation there is no labored breathing.  Heart is slightly bradycardic in the 50s without murmur gallop or rub he has some lower extremity edema left greater than right I would say  this is moderate- he says this is baseline possibly improved since his surgery.--Pedal pulses are intact bilaterally  Abdomen is soft nontender with positive bowel sounds.   Musculoskeletal he is able to move all extremities x4 is now using a walker-appears able to move extremities at baseline with some left lower extremity weakness status post surgery.  Neurologic is grossly intact speech is clear no lateralizing findings.  Psych he is alert and oriented pleasant and appropriate      Labs reviewed: Basic Metabolic Panel: Recent Labs    05/28/18 0830 05/30/18 0415 06/06/18 0700  NA 138 136 138  K 3.3* 3.3* 3.7  CL 101 100 102  CO2 _0 GLUCOSE 96 111* 109*  BUN 51* 63* 51*  CREATININE 2.83* 2.91* 2.42*  CALCIUM 8.6* 8.7* 9.0   Liver Function Tests: Recent Labs    09/03/17 0838 02/25/18 1101 06/06/18 0700  AST _1 ALT 12* 12 25  ALKPHOS 82 65 217*  BILITOT 1.1 1.3* 1.1  PROT 7.0 7.6 7.6  ALBUMIN 3.4* 4.0 3.6   No results for input(s): LIPASE, AMYLASE in the last 8760 hours. No results for input(s): AMMONIA in the last 8760 hours. CBC: Recent Labs    05/26/18 0313 05/27/18 0341 05/28/18 0830 06/06/18 0700  WBC 8.9 9.1 8.6 7.3  NEUTROABS 6.4  --  5.9 4.5  HGB 13.0 13.0 12.8*  13.4  HCT 40.0 38.8* 39.8 42.3  MCV 84.4 84.7 86.7 89.1  PLT 137* 157 163 196   Cardiac Enzymes: No results for input(s): CKTOTAL, CKMB, CKMBINDEX, TROPONINI in the last 8760 hours. BNP: Invalid input(s): POCBNP CBG: Recent Labs    05/27/18 1114 05/27/18 1624 05/27/18 2112  GLUCAP 130* 104* 156*    Procedures and Imaging Studies During Stay: Dg Chest 2 View  Result Date: 05/13/2018 CLINICAL DATA:  Preop for total knee replacement. EXAM: CHEST - 2 VIEW COMPARISON:  April 13, 2017 FINDINGS: The heart size and mediastinal contours are within normal limits. There is no focal infiltrate, pulmonary edema, or pleural effusion. Degenerative joint changes of the  spine are noted. IMPRESSION: No active cardiopulmonary disease. Electronically Signed   By: Abelardo Diesel M.D.   On: 05/13/2018 14:33    Assessment/Plan:     #1- history of total knee replacement on the left- he will need orthopedic follow-up has been arranged changes on aspirin for DVT prophylaxis twice a day- continues on oxycodone as well as Tylenol for pain control he says this helps significantly  #2- history of hypertension as noted above this appears relatively stable on current medications including losartan-hctz  bisoprolol-as well as Lasix and Norvasc.  .  3.  History of chronic kidney disease stage IV creatinine appears stable to actually slightly improved at 2.42 on lab done on November 4- will update next week home health will have to draw this and notify provider of results.  .  4.-  History of type 2 diabetes this appears stable on Tresiba as noted above with blood sugars largely in the 90s to low 100s.  .  5.  Coronary artery disease this appears stable as well on Plavix as well as aspirin does have a nitro order as needed--of note he is also on Imdur  6--.  COPD this appears stable on Symbicort montelukast and Proventil  #7- history of allergic rhinitis this is been stable on Zyrtec.  8.-  History of edema--CHF-- again per patient this appears stable on Lasix I do note he is also on low-dose hydrochlorothiazide will await primary care provider whether he needs to be on both.  His weight appears to be stable appears to be well compensated at this time  9.  History of hypothyroidism he continues on Synthroid.--Will defer to primary care provider for follow-up.  10.-  History of insomnia continues on trazodone at night this appears to be well-tolerated  11.  History of bradycardia this appears to be fairly baseline EKG as noted above followed by primary care provider as needed.     he will need continued PT and OT as well as follow-up by primary care  provider-he will be going home with his significant other who apparently is very supportive- he will need expedient primary care follow-up and updated BMP next week to keep an eye on his renal function and electrolytes I note he is on Lasix with potassium  Discharge with prescription for 30 tablets of oxycodone 5 mg that he takes 1-2 every 4 hours as needed--have ordered no refills-- follow-up with primary care provider and orthopedics  [phone]- of note greater than 30 minutes spent on this discharge summary- greater than 50% of time spent coordinating a plan of care for numerous diagnoses

## 2018-06-23 ENCOUNTER — Telehealth: Payer: Self-pay | Admitting: Cardiology

## 2018-06-23 NOTE — Telephone Encounter (Signed)
Occupational Therapist cvCalling to inform of patient having HR of 46 today. Normally mid 49's

## 2018-06-23 NOTE — Telephone Encounter (Signed)
Left detailed message for [facility] nurse - also spoke with pt and voiced understanding - updated medication list

## 2018-06-23 NOTE — Telephone Encounter (Signed)
Noted.  Generally would prefer his heart rate is over 50.  He is on bisoprolol which I would have him reduce to 2.5 mg daily from 5 mg daily.

## 2018-06-23 NOTE — Telephone Encounter (Signed)
[facility] nurse reported HR at 44 today says this was the 1st time it has gone below 50 - BP WNL  - last few OV HR has been around 50 - pt is asymptomatic - [facility] nurse wanted parameters for pt HR and when to inform provider

## 2018-06-27 ENCOUNTER — Ambulatory Visit (HOSPITAL_COMMUNITY): Payer: Medicare HMO | Attending: Orthopaedic Surgery

## 2018-06-27 ENCOUNTER — Encounter (HOSPITAL_COMMUNITY): Payer: Self-pay

## 2018-07-06 ENCOUNTER — Ambulatory Visit (HOSPITAL_COMMUNITY): Payer: Medicare HMO | Attending: Physical Therapy | Admitting: Physical Therapy

## 2018-07-07 ENCOUNTER — Ambulatory Visit (HOSPITAL_COMMUNITY): Payer: Medicare HMO

## 2018-07-11 ENCOUNTER — Encounter (HOSPITAL_COMMUNITY): Payer: Medicare HMO

## 2018-07-13 ENCOUNTER — Encounter (HOSPITAL_COMMUNITY): Payer: Medicare HMO

## 2018-07-15 ENCOUNTER — Encounter (HOSPITAL_COMMUNITY): Payer: Medicare HMO

## 2018-07-18 ENCOUNTER — Encounter (HOSPITAL_COMMUNITY): Payer: Medicare HMO

## 2018-07-20 ENCOUNTER — Encounter (HOSPITAL_COMMUNITY): Payer: Medicare HMO

## 2018-07-22 ENCOUNTER — Encounter (HOSPITAL_COMMUNITY): Payer: Medicare HMO

## 2018-08-20 IMAGING — CT CT ABD-PELV W/O CM
2 of 4 series · 15 of 46 positions shown, 17 images · non-contrast
Comparison: 06/07/2015

CLINICAL DATA: Vomiting, sepsis, non-Hodgkin's lymphoma, diabetes,
chronic kidney disease. Remote cholecystectomy.

EXAM:
CT ABDOMEN AND PELVIS WITHOUT CONTRAST
TECHNIQUE: Multidetector CT imaging of the abdomen and pelvis was performed
following the standard protocol without IV contrast.

[Series 2: axial st · axial · 0.98mm/px · z∈[+547,+1017]mm · 12 of 108 slices shown, 14 images]
[im 9/108  soft-tissue]
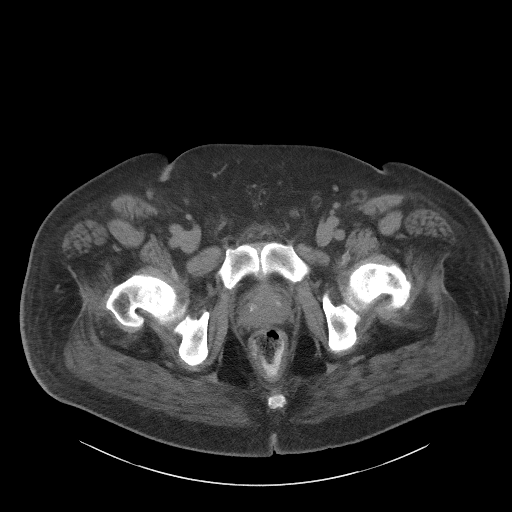
[im 9/108  bone]
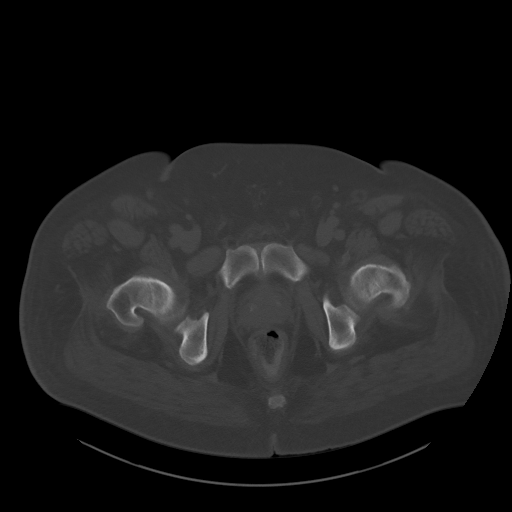
[im 18/108  soft-tissue]
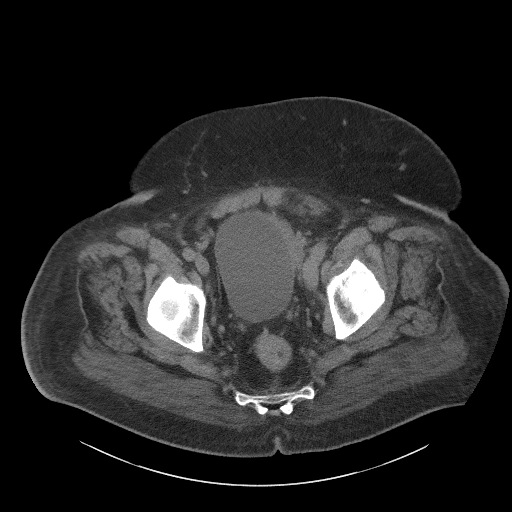
[im 26/108  soft-tissue]
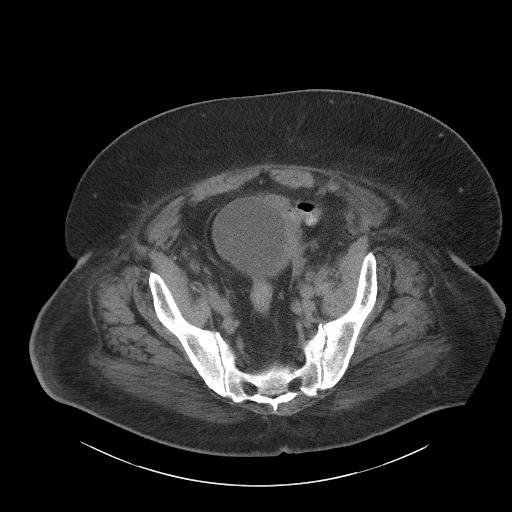
[im 35/108  soft-tissue]
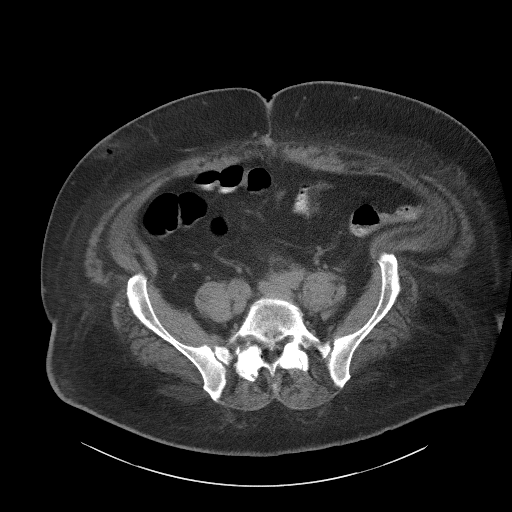
[im 43/108  soft-tissue]
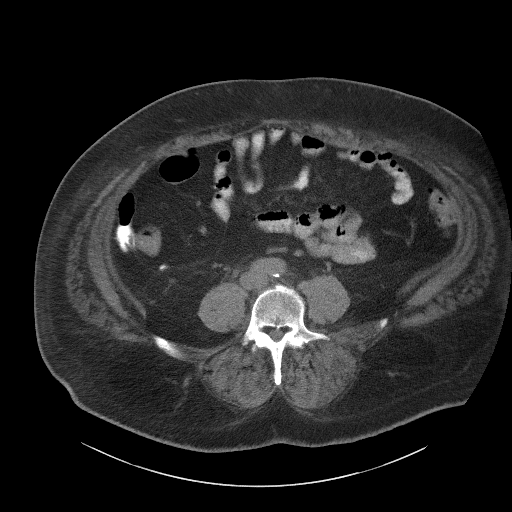
[im 52/108  soft-tissue]
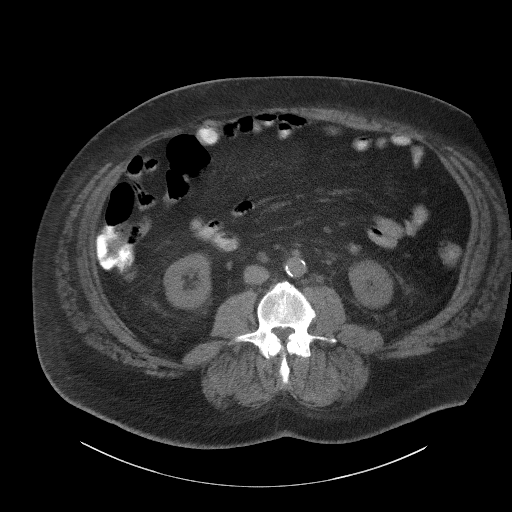
[im 60/108  soft-tissue]
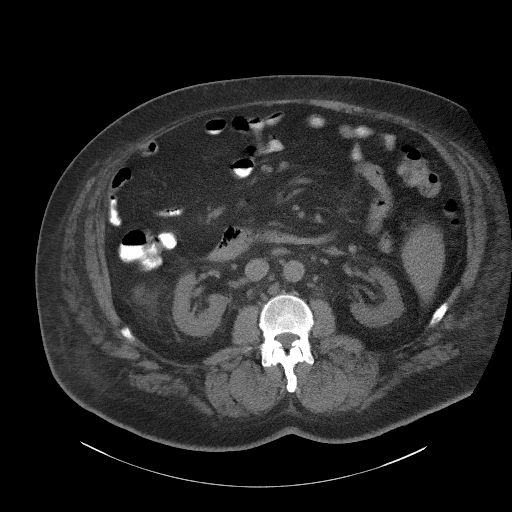
[im 69/108  soft-tissue]
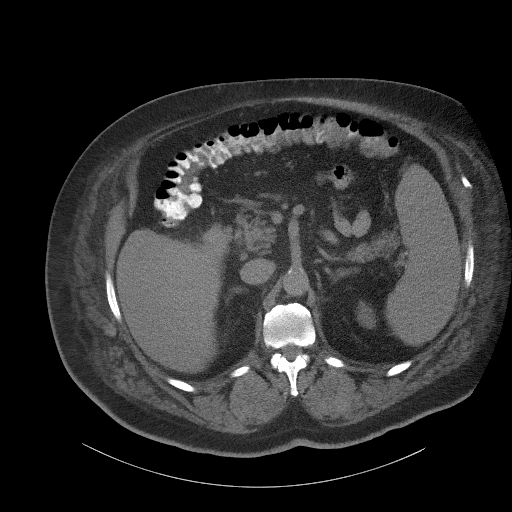
[im 78/108  soft-tissue]
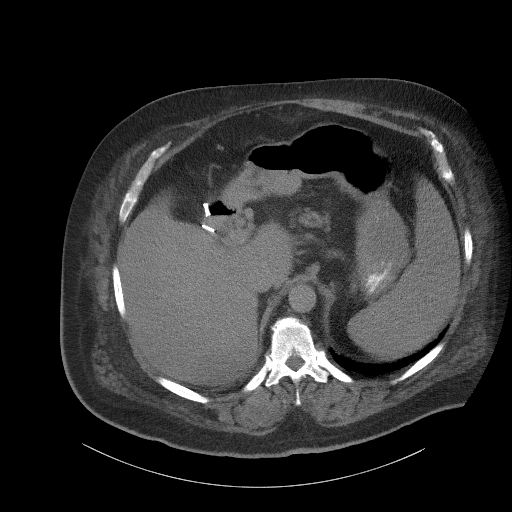
[im 78/108  bone]
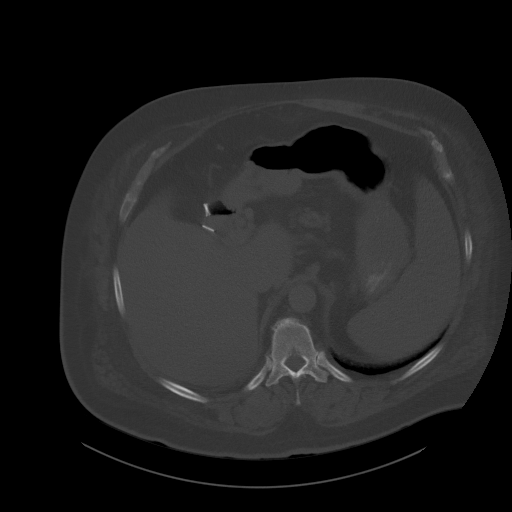
[im 86/108  soft-tissue]
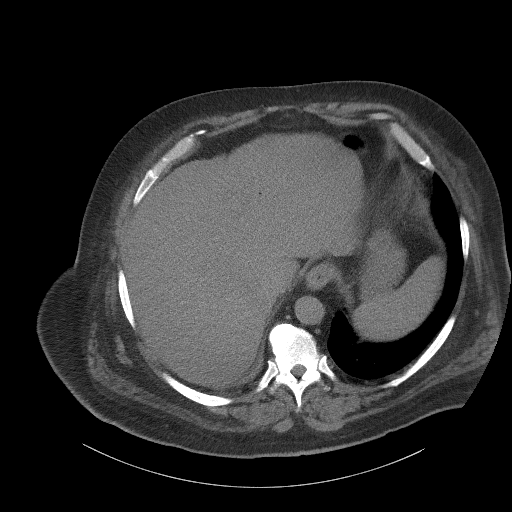
[im 95/108  soft-tissue]
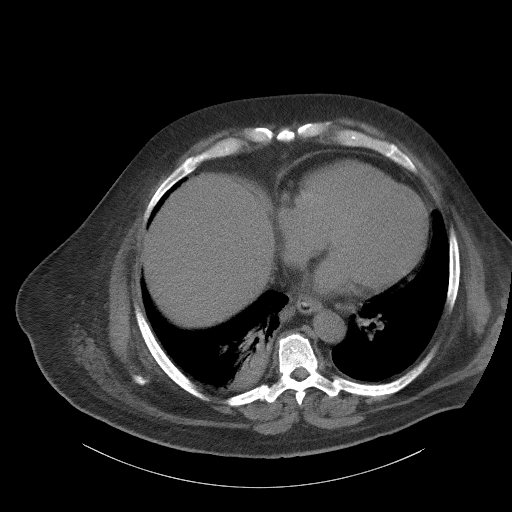
[im 103/108  soft-tissue]
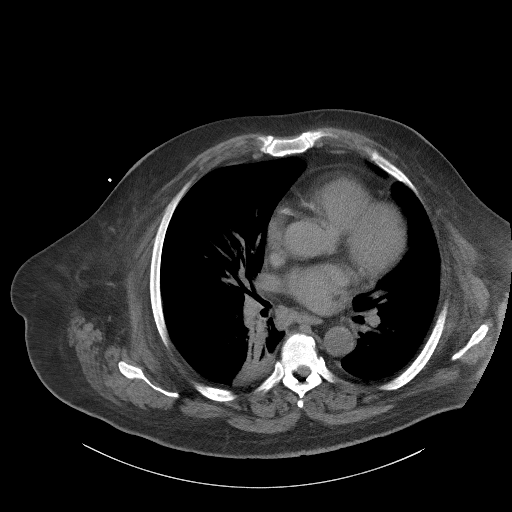

[Series 4: coronal st · coronal · 1.02mm/px · 3 of 136 slices shown]
[im 46/136  soft-tissue]
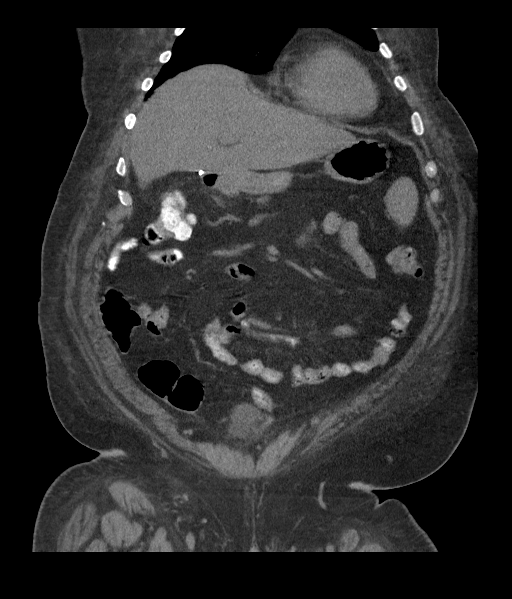
[im 61/136  soft-tissue]
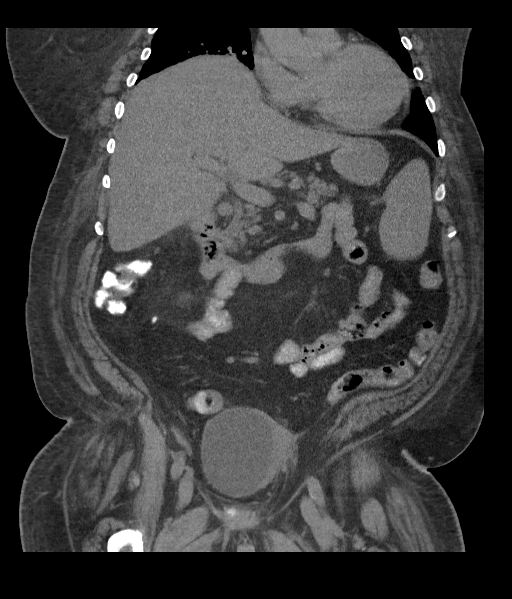
[im 76/136  soft-tissue]
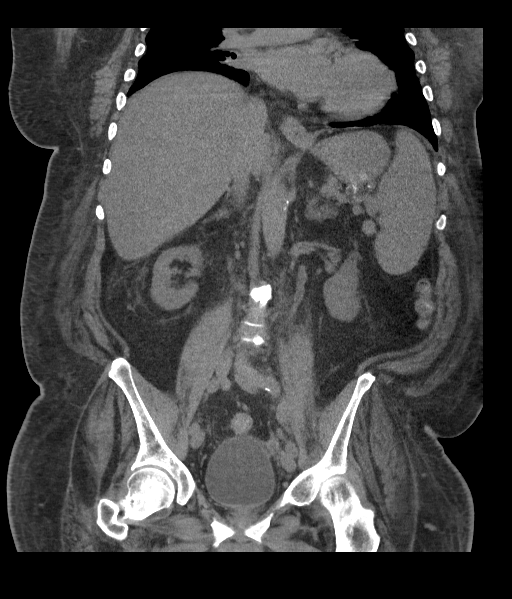

[15 of 46 positions shown; findings below may reference images not displayed]

FINDINGS: Lower chest: Right middle lobe and medial right lower lobe partial
collapse/ consolidation with central air bronchograms. No mucous
plugging. Difficult to exclude consolidative pneumonia. Left lower
lobe is clear. Mild lower chest paraesophageal prominent lymph nodes
as before. Normal heart size. No pericardial or pleural effusion.
Degenerative changes of the thoracic spine.

Hepatobiliary: Remote cholecystectomy. No biliary obstruction or
dilatation. No definite focal hepatic abnormality within the limits
of noncontrast imaging.

Pancreas: Unremarkable. No pancreatic ductal dilatation or
surrounding inflammatory changes.

Spleen: Stable mild splenomegaly. Spleen measures 15.3 cm in length.
Access to splenule noted in the hilum.

Adrenals/Urinary Tract: Normal adrenal glands. Kidneys demonstrate
chronic appearing perinephric strandy edema without obstruction or
hydronephrosis. No obstructing ureteral calculus. Stable appearance
of the bladder. Persistent soft tissue mass along the left aspect of
the bladder dome, appearing smaller roughly measuring 6.4 x 2.5 cm,
previously 7.4 x 5.3 cm. This is compatible with known residual
lymphoma which has had radiation therapy.

Stomach/Bowel: Negative for bowel obstruction, significant
dilatation, ileus, or free air. Normal appearing appendix with
contrast in the lumen. No fluid collection or abscess.

Vascular/Lymphatic: Aortic atherosclerosis evident without aneurysm.
Iliac vessels are mildly atherosclerotic and tortuous. Stable small
retroperitoneal periaortic nodes and iliac nodes. These all measure
less than 1 cm. No significant developing adenopathy.

Reproductive: Unremarkable.

Other: Anterior midline partially calcified omental mass just below
the xiphoid again noted without change may represent a treated
calcified residual nodal mass.

Musculoskeletal: Diffuse degenerative changes noted spine. Lower
lumbar facet arthropathy, most pronounced at L5-S1. No acute
compression fracture or other acute osseous finding.
IMPRESSION: Stable splenomegaly and small retroperitoneal and iliac lymph nodes.

No evidence of developing adenopathy or significant disease
progression.

Smaller anterior left pelvic mass along the left aspect of the
bladder compatible with a response to radiation therapy.

Right middle lobe and medial right lower lobe partial collapse/
consolidation. Difficult to exclude consolidative pneumonia.

No abdominal fluid collection or abscess.

## 2018-08-31 ENCOUNTER — Other Ambulatory Visit (HOSPITAL_COMMUNITY): Payer: Medicare HMO

## 2018-09-07 ENCOUNTER — Ambulatory Visit (HOSPITAL_COMMUNITY): Payer: Medicare HMO | Admitting: Hematology

## 2018-09-23 ENCOUNTER — Ambulatory Visit (HOSPITAL_COMMUNITY): Payer: Medicare HMO | Admitting: Hematology

## 2018-09-23 NOTE — Progress Notes (Deleted)
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 The mass is mild to moderate in metabolic activity consistent with low-grade lymphoma. Mild left external iliac and common iliac metabolic adenopathy.Single retroperitoneal node anterior to L renal vein    10/31/2014 - 11/21/2014 Chemotherapy    Single Agent Rituxan weekly x 4    01/21/2015 -  Antibody Plan       09/05/2015 - 09/23/2015 Radiation Therapy    Palliative XRT to palvic mass adjacent to bladder, 24 Gy in 12 fractions by Dr. Tammi Klippel    01/27/2016 PET scan    Hypermetabolic anterior left pelvic mass abutting the left superior bladder wall has decreased in size but mildly increased in metabolism, consistent with mild metabolic progression.    08/14/2016 PET scan    1. The mass along the left anterior urinary bladder is reduced in size and reduced in activity compared to the prior exam. The 2. Sub xiphoid os ossific structure with adjacent faint soft tissue density, the ossific structure has similar metabolic activity to other normal bony structures. 3. Mildly enlarged right subcarinal lymph node is not hypermetabolic and is below mediastinal activity. 4. Splenomegaly, without splenic hypermetabolic activity.    02/05/2017 Imaging    CT C/A/P: IMPRESSION: 1. Mild mixed changes. Soft tissue mass abutting the left superior bladder wall is mildly  decreased in size. Subxiphoid mass is stable. Mild left inguinal lymphadenopathy is stable. Subcarinal and AP window lymphadenopathy is mildly increased. 2. Stable mild splenomegaly. 3. Stable trace pericardial effusion/thickening. 4. Aortic atherosclerosis. Stable 4.0 cm ectatic ascending thoracic aorta. Recommend annual imaging followup by CTA or MRA. This recommendation follows 2010 ACCF/AHA/AATS/ACR/ASA/SCA/SCAI/SIR/STS/SVM Guidelines for the Diagnosis and Management of Patients with Thoracic Aortic Disease. Circulation. 2010; 121: I786-V672. 5. Two-vessel coronary atherosclerosis. 6. Mild diffuse hepatic steatosis.       CANCER STAGING: Cancer Staging Marginal zone lymphoma [facility]) Staging form: Lymphoid Neoplasms, AJCC 6th Edition - Clinical stage from 11/07/2014: Stage II - Unsigned    INTERVAL HISTORY:  [name] [name] 73 y.o. male returns for routine follow-up for marginal zone lymphoma.    REVIEW OF SYSTEMS:  Review of Systems - Oncology   PAST MEDICAL/SURGICAL HISTORY:  Past Medical History:  Diagnosis Date  . CAD (coronary artery disease)    DES to LAD June 2018  . CKD (chronic kidney disease) stage 3, GFR 30-59 ml/min (HCC)   . Complication of anesthesia    patient had an epidural with his right knee and it took him 6 hours before he could move, he does not want another one  . Essential hypertension   . History of pneumonia   . Hyperlipidemia   . Hypothyroidism   . LBBB (left bundle branch block)   . Morbid obesity ([place])   . Non Hodgkin's lymphoma ([place])    Status post XRT and chemotherapy  . NSTEMI (non-ST elevated  myocardial infarction) [facility])    June 2018  . Peripheral neuropathy   . Sleep apnea   . Type 2 diabetes mellitus ([place])    Past Surgical History:  Procedure Laterality Date  . CHOLECYSTECTOMY  1992  . COLONOSCOPY N/A 09/03/2014   SLF:six colon polyps removed/small internal hemorrhoids  . CORONARY STENT INTERVENTION N/A 01/21/2017   Procedure:  Coronary Stent Intervention;  Surgeon: Nelva Bush, MD;  Location: [facility];  Service: Cardiovascular;  Laterality: N/A;  . ESOPHAGOGASTRODUODENOSCOPY N/A 09/03/2014   SLF: mild gastritis/few gastric polyps  . LEFT HEART CATH AND CORONARY ANGIOGRAPHY N/A 01/20/2017   Procedure: Left Heart Cath and Coronary Angiography;  Surgeon: Jettie Booze, MD;  Location: [facility];  Service: Cardiovascular;  Laterality: N/A;  . TOTAL KNEE ARTHROPLASTY  11/10/2011   Procedure: TOTAL KNEE ARTHROPLASTY;  Surgeon: Mauri Pole, MD;  Location: [facility];  Service: Orthopedics;  Laterality: Right;  . TOTAL KNEE ARTHROPLASTY Left 05/24/2018   Procedure: LEFT TOTAL KNEE ARTHROPLASTY;  Surgeon: Melrose Nakayama, MD;  Location: [facility];  Service: Orthopedics;  Laterality: Left;     SOCIAL HISTORY:  Social History   Socioeconomic History  . Marital status: Divorced    Spouse name: Not on file  . Number of children: Not on file  . Years of education: Not on file  . Highest education level: Not on file  Occupational History  . Not on file  Social Needs  . Financial resource strain: Not on file  . Food insecurity:    Worry: Not on file    Inability: Not on file  . Transportation needs:    Medical: Not on file    Non-medical: Not on file  Tobacco Use  . Smoking status: Current Some Day Smoker    Packs/day: 0.25    Years: 30.00    Pack years: 7.50    Types: Cigarettes    Last attempt to quit: 08/04/2007    Years since quitting: 11.1  . Smokeless tobacco: Never Used  . Tobacco comment: "smoke a cigarette every now and then"  Substance and Sexual Activity  . Alcohol use: No    Alcohol/week: 0.0 standard drinks  . Drug use: No  . Sexual activity: Yes  Lifestyle  . Physical activity:    Days per week: Not on file    Minutes per session: Not on file  . Stress: Not on file  Relationships  . Social connections:    Talks on phone: Not on file    Gets together: Not on file     Attends religious service: Not on file    Active member of club or organization: Not on file    Attends meetings of clubs or organizations: Not on file    Relationship status: Not on file  . Intimate partner violence:    Fear of current or ex partner: Not on file    Emotionally abused: Not on file    Physically abused: Not on file    Forced sexual activity: Not on file  Other Topics Concern  . Not on file  Social History Narrative  . Not on file    FAMILY HISTORY:  Family History  Problem Relation Age of Onset  . Cancer Mother        breast and lung  . Cancer Father        bladder  . Cancer Maternal Uncle        prostate  . Cancer Paternal Uncle  esophagus  . Colon cancer Neg Hx     CURRENT MEDICATIONS:  Outpatient Encounter Medications as of 09/23/2018  Medication Sig  . acetaminophen (TYLENOL) 325 MG tablet Take 650 mg by mouth every 6 (six) hours as needed.  Marland Kitchen albuterol (PROVENTIL HFA;VENTOLIN HFA) 108 (90 Base) MCG/ACT inhaler Inhale 2 puffs into the lungs every 6 (six) hours as needed for wheezing or shortness of breath.  Marland Kitchen amLODipine (NORVASC) 10 MG tablet Take 10 mg by mouth daily.  Marland Kitchen aspirin EC 325 MG EC tablet Take 1 tablet (325 mg total) by mouth 2 (two) times daily after a meal.  . bisacodyl (DULCOLAX) 5 MG EC tablet Take 1 tablet (5 mg total) by mouth daily as needed for moderate constipation.  . bisoprolol (ZEBETA) 5 MG tablet Take 2.5 mg by mouth daily.  . budesonide-formoterol (SYMBICORT) 80-4.5 MCG/ACT inhaler Inhale 2 puffs into the lungs daily as needed (shortness of breath).   . clopidogrel (PLAVIX) 75 MG tablet Take 1 tablet (75 mg total) by mouth daily.  Marland Kitchen docusate sodium (COLACE) 100 MG capsule Take 1 capsule (100 mg total) by mouth 2 (two) times daily.  . furosemide (LASIX) 40 MG tablet Take 40 mg by mouth daily.  . insulin degludec (TRESIBA FLEXTOUCH) 100 UNIT/ML SOPN FlexTouch Pen Inject 0.5 mLs (50 Units total) into the skin daily at 10 pm.  .  isosorbide mononitrate (IMDUR) 30 MG 24 hr tablet Take 1 tablet (30 mg total) by mouth daily.  Marland Kitchen levocetirizine (XYZAL) 5 MG tablet Take 5 mg by mouth every evening.   Marland Kitchen levothyroxine (SYNTHROID, LEVOTHROID) 200 MCG tablet Take 200 mcg by mouth daily before breakfast.  . losartan-hydrochlorothiazide (HYZAAR) 100-25 MG tablet Take 1 tablet by mouth daily.  . montelukast (SINGULAIR) 10 MG tablet Take 10 mg by mouth at bedtime.   . nitroGLYCERIN (NITROSTAT) 0.4 MG SL tablet Place 0.4 mg under the tongue every 5 (five) minutes as needed for chest pain.   Marland Kitchen oxyCODONE (OXY IR/ROXICODONE) 5 MG immediate release tablet Take 10 mg by mouth every 4 (four) hours as needed for severe pain.  Marland Kitchen oxyCODONE (OXY IR/ROXICODONE) 5 MG immediate release tablet Take 5 mg by mouth every 4 (four) hours as needed for severe pain.  . potassium chloride SA (K-DUR,KLOR-CON) 20 MEQ tablet Take 20 mEq by mouth daily.  . simvastatin (ZOCOR) 5 MG tablet Take 5 mg by mouth daily.  . Sodium Chloride-Sodium Bicarb (NETI POT SINUS WASH) 2300-700 MG KIT Place 1 Dose into the nose 2 (two) times daily.  . traZODone (DESYREL) 50 MG tablet Take 50 mg by mouth at bedtime.    No facility-administered encounter medications on file as of 09/23/2018.     ALLERGIES:  No Known Allergies   PHYSICAL EXAM:  ECOG Performance status: ***  There were no vitals filed for this visit. There were no vitals filed for this visit.  Physical Exam   LABORATORY DATA:  I have reviewed the labs as listed.  CBC    Component Value Date/Time   WBC 7.3 06/06/2018 0700   RBC 4.75 06/06/2018 0700   HGB 13.4 06/06/2018 0700   HCT 42.3 06/06/2018 0700   PLT 196 06/06/2018 0700   MCV 89.1 06/06/2018 0700   MCH 28.2 06/06/2018 0700   MCHC 31.7 06/06/2018 0700   RDW 15.1 06/06/2018 0700   LYMPHSABS 1.8 06/06/2018 0700   MONOABS 0.4 06/06/2018 0700   EOSABS 0.4 06/06/2018 0700   BASOSABS 0.1 06/06/2018 0700   CMP  Latest Ref Rng & Units 06/06/2018  05/30/2018 05/28/2018  Glucose 70 - 99 mg/dL 109(H) 111(H) 96  BUN 8 - 23 mg/dL 51(H) 63(H) 51(H)  Creatinine 0.61 - 1.24 mg/dL 2.42(H) 2.91(H) 2.83(H)  Sodium 135 - 145 mmol/L 138 136 138  Potassium 3.5 - 5.1 mmol/L 3.7 3.3(L) 3.3(L)  Chloride 98 - 111 mmol/L 102 100 101  CO2 22 - 32 mmol/L 27 24 26   Calcium 8.9 - 10.3 mg/dL 9.0 8.7(L) 8.6(L)  Total Protein 6.5 - 8.1 g/dL 7.6 - -  Total Bilirubin 0.3 - 1.2 mg/dL 1.1 - -  Alkaline Phos 38 - 126 U/L 217(H) - -  AST 15 - 41 U/L 29 - -  ALT 0 - 44 U/L 25 - -       DIAGNOSTIC IMAGING:  I have independently reviewed the scans and discussed with the patient.   I have reviewed Francene Finders, NP's note and agree with the documentation.  I personally performed a face-to-face visit, made revisions and my assessment and plan is as follows.    ASSESSMENT & PLAN:   No problem-specific Assessment & Plan notes found for this encounter.      Orders placed this encounter:  No orders of the defined types were placed in this encounter.     Derek Jack, MD [place] [phone]

## 2018-12-27 ENCOUNTER — Inpatient Hospital Stay (HOSPITAL_COMMUNITY): Payer: Medicare HMO | Attending: Hematology

## 2018-12-27 ENCOUNTER — Other Ambulatory Visit: Payer: Self-pay

## 2018-12-27 DIAGNOSIS — R7989 Other specified abnormal findings of blood chemistry: Secondary | ICD-10-CM | POA: Insufficient documentation

## 2018-12-27 DIAGNOSIS — Z8572 Personal history of non-Hodgkin lymphomas: Secondary | ICD-10-CM | POA: Insufficient documentation

## 2018-12-27 DIAGNOSIS — C8580 Other specified types of non-Hodgkin lymphoma, unspecified site: Secondary | ICD-10-CM

## 2018-12-27 LAB — CBC WITH DIFFERENTIAL/PLATELET
Abs Immature Granulocytes: 0.03 10*3/uL (ref 0.00–0.07)
Basophils Absolute: 0.1 10*3/uL (ref 0.0–0.1)
Basophils Relative: 1 %
Eosinophils Absolute: 0.3 10*3/uL (ref 0.0–0.5)
Eosinophils Relative: 4 %
HCT: 48.6 % (ref 39.0–52.0)
Hemoglobin: 15.6 g/dL (ref 13.0–17.0)
Immature Granulocytes: 0 %
Lymphocytes Relative: 25 %
Lymphs Abs: 2 10*3/uL (ref 0.7–4.0)
MCH: 28.8 pg (ref 26.0–34.0)
MCHC: 32.1 g/dL (ref 30.0–36.0)
MCV: 89.8 fL (ref 80.0–100.0)
Monocytes Absolute: 0.7 10*3/uL (ref 0.1–1.0)
Monocytes Relative: 8 %
Neutro Abs: 5 10*3/uL (ref 1.7–7.7)
Neutrophils Relative %: 62 %
Platelets: 138 10*3/uL — ABNORMAL LOW (ref 150–400)
RBC: 5.41 MIL/uL (ref 4.22–5.81)
RDW: 16.1 % — ABNORMAL HIGH (ref 11.5–15.5)
WBC: 8.1 10*3/uL (ref 4.0–10.5)
nRBC: 0 % (ref 0.0–0.2)

## 2018-12-27 LAB — COMPREHENSIVE METABOLIC PANEL
ALT: 12 U/L (ref 0–44)
AST: 13 U/L — ABNORMAL LOW (ref 15–41)
Albumin: 4 g/dL (ref 3.5–5.0)
Alkaline Phosphatase: 79 U/L (ref 38–126)
Anion gap: 10 (ref 5–15)
BUN: 26 mg/dL — ABNORMAL HIGH (ref 8–23)
CO2: 24 mmol/L (ref 22–32)
Calcium: 9.3 mg/dL (ref 8.9–10.3)
Chloride: 105 mmol/L (ref 98–111)
Creatinine, Ser: 2.14 mg/dL — ABNORMAL HIGH (ref 0.61–1.24)
GFR calc Af Amer: 35 mL/min — ABNORMAL LOW (ref >60)
GFR calc non Af Amer: 30 mL/min — ABNORMAL LOW (ref >60)
Glucose, Bld: 233 mg/dL — ABNORMAL HIGH (ref 70–99)
Potassium: 3.6 mmol/L (ref 3.5–5.1)
Sodium: 139 mmol/L (ref 135–145)
Total Bilirubin: 1.2 mg/dL (ref 0.3–1.2)
Total Protein: 7.4 g/dL (ref 6.5–8.1)

## 2018-12-27 LAB — LACTATE DEHYDROGENASE: LDH: 126 U/L (ref 98–192)

## 2019-01-03 ENCOUNTER — Other Ambulatory Visit: Payer: Self-pay

## 2019-01-03 ENCOUNTER — Encounter (HOSPITAL_COMMUNITY): Payer: Self-pay | Admitting: Nurse Practitioner

## 2019-01-03 ENCOUNTER — Inpatient Hospital Stay (HOSPITAL_COMMUNITY): Payer: Medicare HMO | Attending: Nurse Practitioner | Admitting: Nurse Practitioner

## 2019-01-03 DIAGNOSIS — E1122 Type 2 diabetes mellitus with diabetic chronic kidney disease: Secondary | ICD-10-CM | POA: Diagnosis not present

## 2019-01-03 DIAGNOSIS — E039 Hypothyroidism, unspecified: Secondary | ICD-10-CM | POA: Diagnosis not present

## 2019-01-03 DIAGNOSIS — I129 Hypertensive chronic kidney disease with stage 1 through stage 4 chronic kidney disease, or unspecified chronic kidney disease: Secondary | ICD-10-CM | POA: Diagnosis not present

## 2019-01-03 DIAGNOSIS — F17210 Nicotine dependence, cigarettes, uncomplicated: Secondary | ICD-10-CM | POA: Insufficient documentation

## 2019-01-03 DIAGNOSIS — Z8572 Personal history of non-Hodgkin lymphomas: Secondary | ICD-10-CM

## 2019-01-03 DIAGNOSIS — C8580 Other specified types of non-Hodgkin lymphoma, unspecified site: Secondary | ICD-10-CM | POA: Diagnosis present

## 2019-01-03 DIAGNOSIS — N183 Chronic kidney disease, stage 3 (moderate): Secondary | ICD-10-CM

## 2019-01-03 DIAGNOSIS — E669 Obesity, unspecified: Secondary | ICD-10-CM | POA: Diagnosis not present

## 2019-01-03 DIAGNOSIS — Z79899 Other long term (current) drug therapy: Secondary | ICD-10-CM | POA: Insufficient documentation

## 2019-01-03 NOTE — Progress Notes (Signed)
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 The mass is mild to moderate in metabolic activity consistent with low-grade lymphoma. Mild left external iliac and common iliac metabolic adenopathy.Single retroperitoneal node anterior to L renal vein    10/31/2014 - 11/21/2014 Chemotherapy    Single Agent Rituxan weekly x 4    01/21/2015 -  Antibody Plan       09/05/2015 - 09/23/2015 Radiation Therapy    Palliative XRT to palvic mass adjacent to bladder, 24 Gy in 12 fractions by Dr. Tammi Klippel    01/27/2016 PET scan    Hypermetabolic anterior left pelvic mass abutting the left superior bladder wall has decreased in size but mildly increased in metabolism, consistent with mild metabolic progression.    08/14/2016 PET scan    1. The mass along the left anterior urinary bladder is reduced in size and reduced in activity compared to the prior exam. The 2. Sub xiphoid os ossific structure with adjacent faint soft tissue density, the ossific structure has similar metabolic activity to other normal bony structures. 3. Mildly enlarged right subcarinal lymph node is not hypermetabolic and is below mediastinal activity. 4. Splenomegaly, without splenic hypermetabolic activity.    02/05/2017 Imaging    CT C/A/P: IMPRESSION: 1. Mild mixed changes. Soft tissue mass abutting the left superior bladder wall is mildly  decreased in size. Subxiphoid mass is stable. Mild left inguinal lymphadenopathy is stable. Subcarinal and AP window lymphadenopathy is mildly increased. 2. Stable mild splenomegaly. 3. Stable trace pericardial effusion/thickening. 4. Aortic atherosclerosis. Stable 4.0 cm ectatic ascending thoracic aorta. Recommend annual imaging followup by CTA or MRA. This recommendation follows 2010 ACCF/AHA/AATS/ACR/ASA/SCA/SCAI/SIR/STS/SVM Guidelines for the Diagnosis and Management of Patients with Thoracic Aortic Disease. Circulation. 2010; 121: Y616-O372. 5. Two-vessel coronary atherosclerosis. 6. Mild diffuse hepatic steatosis.       CANCER STAGING: Cancer Staging Marginal zone lymphoma [facility]) Staging form: Lymphoid Neoplasms, AJCC 6th Edition - Clinical stage from 11/07/2014: Stage II - Unsigned    INTERVAL HISTORY:  [name] [name] 73 y.o. male returns for routine follow-up for marginal cell lymphoma.  He reports he has been doing well since his last visit.  He denies any new lumps felt.  He reports he is normally fatigued and it is unchanged at this time.  He walks with a walker.  He denies any fevers, night sweats, chills, or unexplained weight loss. Denies any nausea, vomiting, or diarrhea. Denies any new pains. Had not noticed any recent bleeding such as epistaxis, hematuria or hematochezia. Denies recent chest pain on exertion, shortness of breath on minimal exertion, pre-syncopal episodes, or palpitations. Denies any numbness or tingling in hands or feet. Denies any recent fevers, infections, or recent hospitalizations. Patient reports appetite at 100% and energy level at 50%.  He is eating well and maintaining his weight at this time.    REVIEW OF SYSTEMS:  Review of Systems  Constitutional: Positive for fatigue.  Respiratory: Positive for shortness of breath.   Psychiatric/Behavioral: Positive for sleep disturbance.  All other systems reviewed and are negative.    PAST  MEDICAL/SURGICAL HISTORY:  Past Medical History:  Diagnosis Date  . CAD (coronary artery disease)    DES to LAD June 2018  . CKD (chronic kidney disease) stage 3, GFR 30-59 ml/min (HCC)   . Complication of anesthesia    patient had an epidural with his right knee and it took him 6 hours before he could move, he does not want another one  . Essential hypertension   . History of pneumonia   . Hyperlipidemia   . Hypothyroidism   . LBBB (left bundle branch block)   . Morbid obesity ([place])   . Non Hodgkin's lymphoma ([place])    Status post XRT and chemotherapy  . NSTEMI (non-ST elevated myocardial infarction) [facility])    June 2018  . Peripheral neuropathy   . Sleep apnea   . Type 2 diabetes mellitus ([place])    Past Surgical History:  Procedure Laterality Date  . CHOLECYSTECTOMY  1992  . COLONOSCOPY N/A 09/03/2014   SLF:six colon polyps removed/small internal hemorrhoids  . CORONARY STENT INTERVENTION N/A 01/21/2017   Procedure: Coronary Stent Intervention;  Surgeon: Nelva Bush, MD;  Location: [facility];  Service: Cardiovascular;  Laterality: N/A;  . ESOPHAGOGASTRODUODENOSCOPY N/A 09/03/2014   SLF: mild gastritis/few gastric polyps  . LEFT HEART CATH AND CORONARY ANGIOGRAPHY N/A 01/20/2017   Procedure: Left Heart Cath and Coronary Angiography;  Surgeon: Jettie Booze, MD;  Location: [facility];  Service: Cardiovascular;  Laterality: N/A;  . TOTAL KNEE ARTHROPLASTY  11/10/2011   Procedure: TOTAL KNEE ARTHROPLASTY;  Surgeon: Mauri Pole, MD;  Location: [facility];  Service: Orthopedics;  Laterality: Right;  . TOTAL KNEE ARTHROPLASTY Left 05/24/2018   Procedure: LEFT TOTAL KNEE ARTHROPLASTY;  Surgeon: Melrose Nakayama, MD;  Location: [facility];  Service: Orthopedics;  Laterality: Left;     SOCIAL HISTORY:  Social History   Socioeconomic History  . Marital status: Divorced    Spouse name: Not on file  . Number of children: Not on file  . Years of education: Not on file  .  Highest education level: Not on file  Occupational History  . Not on file  Social Needs  . Financial resource strain: Not on file  . Food insecurity:    Worry: Not on file    Inability: Not on file  . Transportation needs:    Medical: Not on file    Non-medical: Not on file  Tobacco Use  . Smoking status: Current Some Day Smoker    Packs/day: 0.25    Years: 30.00    Pack years: 7.50    Types: Cigarettes    Last attempt to quit: 08/04/2007    Years since quitting: 11.4  . Smokeless tobacco: Never Used  . Tobacco comment: "smoke a cigarette every now and then"  Substance and Sexual Activity  . Alcohol use: No    Alcohol/week: 0.0 standard drinks  . Drug use: No  . Sexual activity: Yes  Lifestyle  . Physical activity:    Days per week: Not on file    Minutes per session: Not on file  . Stress: Not on file  Relationships  . Social connections:    Talks on phone: Not on file    Gets together: Not on file    Attends religious  service: Not on file    Active member of club or organization: Not on file    Attends meetings of clubs or organizations: Not on file    Relationship status: Not on file  . Intimate partner violence:    Fear of current or ex partner: Not on file    Emotionally abused: Not on file    Physically abused: Not on file    Forced sexual activity: Not on file  Other Topics Concern  . Not on file  Social History Narrative  . Not on file    FAMILY HISTORY:  Family History  Problem Relation Age of Onset  . Cancer Mother        breast and lung  . Cancer Father        bladder  . Cancer Maternal Uncle        prostate  . Cancer Paternal Uncle        esophagus  . Colon cancer Neg Hx     CURRENT MEDICATIONS:  Outpatient Encounter Medications as of 01/03/2019  Medication Sig  . acetaminophen (TYLENOL) 325 MG tablet Take 650 mg by mouth every 6 (six) hours as needed.  Marland Kitchen albuterol (PROVENTIL HFA;VENTOLIN HFA) 108 (90 Base) MCG/ACT inhaler Inhale 2 puffs  into the lungs every 6 (six) hours as needed for wheezing or shortness of breath.  Marland Kitchen amLODipine (NORVASC) 10 MG tablet Take 10 mg by mouth daily.  Marland Kitchen aspirin EC 325 MG EC tablet Take 1 tablet (325 mg total) by mouth 2 (two) times daily after a meal.  . bisoprolol (ZEBETA) 5 MG tablet Take 2.5 mg by mouth daily.  . budesonide-formoterol (SYMBICORT) 80-4.5 MCG/ACT inhaler Inhale 2 puffs into the lungs daily as needed (shortness of breath).   . furosemide (LASIX) 40 MG tablet Take 40 mg by mouth daily.  . insulin degludec (TRESIBA FLEXTOUCH) 100 UNIT/ML SOPN FlexTouch Pen Inject 0.5 mLs (50 Units total) into the skin daily at 10 pm.  . levothyroxine (SYNTHROID, LEVOTHROID) 200 MCG tablet Take 200 mcg by mouth daily before breakfast.  . losartan-hydrochlorothiazide (HYZAAR) 100-25 MG tablet Take 1 tablet by mouth daily.  . simvastatin (ZOCOR) 5 MG tablet Take 5 mg by mouth daily.  . clopidogrel (PLAVIX) 75 MG tablet Take 1 tablet (75 mg total) by mouth daily.  . isosorbide mononitrate (IMDUR) 30 MG 24 hr tablet Take 1 tablet (30 mg total) by mouth daily.  Marland Kitchen levocetirizine (XYZAL) 5 MG tablet Take 5 mg by mouth every evening.   . nitroGLYCERIN (NITROSTAT) 0.4 MG SL tablet Place 0.4 mg under the tongue every 5 (five) minutes as needed for chest pain.   . potassium chloride SA (K-DUR,KLOR-CON) 20 MEQ tablet Take 20 mEq by mouth daily.  . traZODone (DESYREL) 50 MG tablet Take 50 mg by mouth at bedtime.   . [DISCONTINUED] bisacodyl (DULCOLAX) 5 MG EC tablet Take 1 tablet (5 mg total) by mouth daily as needed for moderate constipation.  . [DISCONTINUED] docusate sodium (COLACE) 100 MG capsule Take 1 capsule (100 mg total) by mouth 2 (two) times daily.  . [DISCONTINUED] montelukast (SINGULAIR) 10 MG tablet Take 10 mg by mouth at bedtime.   . [DISCONTINUED] oxyCODONE (OXY IR/ROXICODONE) 5 MG immediate release tablet Take 10 mg by mouth every 4 (four) hours as needed for severe pain.  . [DISCONTINUED]  oxyCODONE (OXY IR/ROXICODONE) 5 MG immediate release tablet Take 5 mg by mouth every 4 (four) hours as needed for severe pain.  . [DISCONTINUED] Sodium Chloride-Sodium  Bicarb (NETI POT SINUS WASH) 2300-700 MG KIT Place 1 Dose into the nose 2 (two) times daily.   No facility-administered encounter medications on file as of 01/03/2019.     ALLERGIES:  No Known Allergies   PHYSICAL EXAM:  ECOG Performance status: 1  Vitals:   01/03/19 1000  BP: (!) 156/67  Pulse: (!) 36  Resp: 14  Temp: 98.2 F (36.8 C)  SpO2: 98%   Filed Weights   01/03/19 1000  Weight: 299 lb 4 oz (135.7 kg)    Physical Exam Constitutional:      Appearance: He is obese.  Neck:     Musculoskeletal: Normal range of motion and neck supple.  Cardiovascular:     Rate and Rhythm: Normal rate and regular rhythm.     Heart sounds: Normal heart sounds.  Pulmonary:     Effort: Pulmonary effort is normal.     Breath sounds: Normal breath sounds.  Abdominal:     General: Bowel sounds are normal.     Palpations: Abdomen is soft.  Musculoskeletal: Normal range of motion.  Skin:    General: Skin is warm and dry.  Neurological:     Mental Status: He is alert and oriented to person, place, and time. Mental status is at baseline.  Psychiatric:        Mood and Affect: Mood normal.        Behavior: Behavior normal.        Thought Content: Thought content normal.        Judgment: Judgment normal.      LABORATORY DATA:  I have reviewed the labs as listed.  CBC    Component Value Date/Time   WBC 8.1 12/27/2018 1103   RBC 5.41 12/27/2018 1103   HGB 15.6 12/27/2018 1103   HCT 48.6 12/27/2018 1103   PLT 138 (L) 12/27/2018 1103   MCV 89.8 12/27/2018 1103   MCH 28.8 12/27/2018 1103   MCHC 32.1 12/27/2018 1103   RDW 16.1 (H) 12/27/2018 1103   LYMPHSABS 2.0 12/27/2018 1103   MONOABS 0.7 12/27/2018 1103   EOSABS 0.3 12/27/2018 1103   BASOSABS 0.1 12/27/2018 1103   CMP Latest Ref Rng & Units 12/27/2018 06/06/2018  05/30/2018  Glucose 70 - 99 mg/dL 233(H) 109(H) 111(H)  BUN 8 - 23 mg/dL 26(H) 51(H) 63(H)  Creatinine 0.61 - 1.24 mg/dL 2.14(H) 2.42(H) 2.91(H)  Sodium 135 - 145 mmol/L 139 138 136  Potassium 3.5 - 5.1 mmol/L 3.6 3.7 3.3(L)  Chloride 98 - 111 mmol/L 105 102 100  CO2 22 - 32 mmol/L 24 27 24   Calcium 8.9 - 10.3 mg/dL 9.3 9.0 8.7(L)  Total Protein 6.5 - 8.1 g/dL 7.4 7.6 -  Total Bilirubin 0.3 - 1.2 mg/dL 1.2 1.1 -  Alkaline Phos 38 - 126 U/L 79 217(H) -  AST 15 - 41 U/L 13(L) 29 -  ALT 0 - 44 U/L 12 25 -    I personally performed a face-to-face visit.  All questions were answered to patient's stated satisfaction. Encouraged patient to call with any new concerns or questions before his next visit to the cancer center and we can certain see him sooner, if needed.     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  Stage II marginal zone lymphoma: -He was diagnosed on 09/27/2014 by biopsy of the mass adjacent to the bladder. -Status post rituximab weekly x4 (10/31/2014 through 11/21/2014) followed by every 7-monthmaintenance rituximab till 12/08/2016. - He denies any  B symptoms including fevers, night sweats, chills, or weight loss in the last 6 months. - Upon physical assessment today it did not reveal any palpable adenopathy. -Labs on 12/27/2018 showed his potassium at 3.6, creatinine 2.14, calcium 9.3, LFTs WNL, WBC 8.1, hemoglobin 15.6, and platelets 138.  LDH 126. - Creatinine is stable and slightly better.  His platelets are slightly decreased which also has been stable. - He will follow-up in 6 months with repeat CT scans and repeat labs. -We will continue following him every 6 months with repeat labs.      Orders placed this encounter:  Orders Placed This Encounter  Procedures  . CT Abdomen Pelvis W Contrast  . CT CHEST W CONTRAST  . Lactate dehydrogenase  . CBC with Differential/Platelet  . Comprehensive metabolic panel      Francene Finders, FNP-C [place] [phone]

## 2019-01-03 NOTE — Assessment & Plan Note (Addendum)
1.  Stage II marginal zone lymphoma: -He was diagnosed on 09/27/2014 by biopsy of the mass adjacent to the bladder. -Status post rituximab weekly x4 (10/31/2014 through 11/21/2014) followed by every 12-month maintenance rituximab till 12/08/2016. - He denies any B symptoms including fevers, night sweats, chills, or weight loss in the last 6 months. - Upon physical assessment today it did not reveal any palpable adenopathy. -Labs on 12/27/2018 showed his potassium at 3.6, creatinine 2.14, calcium 9.3, LFTs WNL, WBC 8.1, hemoglobin 15.6, and platelets 138.  LDH 126. - Creatinine is stable and slightly better.  His platelets are slightly decreased which also has been stable. - He will follow-up in 6 months with repeat CT scans and repeat labs. -We will continue following him every 6 months with repeat labs.

## 2019-01-03 NOTE — Patient Instructions (Signed)
[place] at [facility] Discharge Instructions  Follow-up in 6 months with repeat CT scans and labs.   Thank you for choosing [place] at [facility] to provide your oncology and hematology care.  To afford each patient quality time with our provider, please arrive at least 15 minutes before your scheduled appointment time.   If you have a lab appointment with the [facility] please come in thru the  Main Entrance and check in at the main information desk  You need to re-schedule your appointment should you arrive 10 or more minutes late.  We strive to give you quality time with our providers, and arriving late affects you and other patients whose appointments are after yours.  Also, if you no show three or more times for appointments you may be dismissed from the clinic at the providers discretion.     Again, thank you for choosing [facility].  Our hope is that these requests will decrease the amount of time that you wait before being seen by our physicians.       _____________________________________________________________  Should you have questions after your visit to [facility], please contact our office at [phone] between the hours of 8:00 a.m. and 4:30 p.m.  Voicemails left after 4:00 p.m. will not be returned until the following business day.  For prescription refill requests, have your pharmacy contact our office and allow 72 hours.    Cancer Center Support Programs:   > Cancer Support Group  2nd Tuesday of the month 1pm-2pm, Journey Room

## 2019-01-10 ENCOUNTER — Emergency Department (HOSPITAL_COMMUNITY)
Admission: EM | Admit: 2019-01-10 | Discharge: 2019-01-10 | Disposition: A | Discharge status: Home / Self Care | Payer: Medicare HMO | Attending: Emergency Medicine | Admitting: Emergency Medicine

## 2019-01-10 ENCOUNTER — Encounter (HOSPITAL_COMMUNITY): Payer: Self-pay

## 2019-01-10 ENCOUNTER — Emergency Department (HOSPITAL_COMMUNITY): Payer: Medicare HMO

## 2019-01-10 ENCOUNTER — Other Ambulatory Visit: Payer: Self-pay

## 2019-01-10 DIAGNOSIS — Z20828 Contact with and (suspected) exposure to other viral communicable diseases: Secondary | ICD-10-CM | POA: Insufficient documentation

## 2019-01-10 DIAGNOSIS — E039 Hypothyroidism, unspecified: Secondary | ICD-10-CM | POA: Insufficient documentation

## 2019-01-10 DIAGNOSIS — R0602 Shortness of breath: Secondary | ICD-10-CM | POA: Diagnosis present

## 2019-01-10 DIAGNOSIS — E1122 Type 2 diabetes mellitus with diabetic chronic kidney disease: Secondary | ICD-10-CM | POA: Diagnosis not present

## 2019-01-10 DIAGNOSIS — Z96651 Presence of right artificial knee joint: Secondary | ICD-10-CM | POA: Diagnosis not present

## 2019-01-10 DIAGNOSIS — I130 Hypertensive heart and chronic kidney disease with heart failure and stage 1 through stage 4 chronic kidney disease, or unspecified chronic kidney disease: Secondary | ICD-10-CM | POA: Diagnosis not present

## 2019-01-10 DIAGNOSIS — F17210 Nicotine dependence, cigarettes, uncomplicated: Secondary | ICD-10-CM | POA: Diagnosis not present

## 2019-01-10 DIAGNOSIS — N184 Chronic kidney disease, stage 4 (severe): Secondary | ICD-10-CM | POA: Diagnosis not present

## 2019-01-10 DIAGNOSIS — I2510 Atherosclerotic heart disease of native coronary artery without angina pectoris: Secondary | ICD-10-CM | POA: Diagnosis not present

## 2019-01-10 DIAGNOSIS — I5042 Chronic combined systolic (congestive) and diastolic (congestive) heart failure: Secondary | ICD-10-CM | POA: Insufficient documentation

## 2019-01-10 DIAGNOSIS — E1140 Type 2 diabetes mellitus with diabetic neuropathy, unspecified: Secondary | ICD-10-CM | POA: Insufficient documentation

## 2019-01-10 DIAGNOSIS — Z8572 Personal history of non-Hodgkin lymphomas: Secondary | ICD-10-CM | POA: Diagnosis not present

## 2019-01-10 DIAGNOSIS — J40 Bronchitis, not specified as acute or chronic: Secondary | ICD-10-CM | POA: Insufficient documentation

## 2019-01-10 DIAGNOSIS — R0981 Nasal congestion: Secondary | ICD-10-CM | POA: Insufficient documentation

## 2019-01-10 DIAGNOSIS — I252 Old myocardial infarction: Secondary | ICD-10-CM | POA: Insufficient documentation

## 2019-01-10 DIAGNOSIS — Z96652 Presence of left artificial knee joint: Secondary | ICD-10-CM | POA: Insufficient documentation

## 2019-01-10 LAB — CBC WITH DIFFERENTIAL/PLATELET
Abs Immature Granulocytes: 0.02 10*3/uL (ref 0.00–0.07)
Basophils Absolute: 0.1 10*3/uL (ref 0.0–0.1)
Basophils Relative: 1 %
Eosinophils Absolute: 0.3 10*3/uL (ref 0.0–0.5)
Eosinophils Relative: 4 %
HCT: 48.8 % (ref 39.0–52.0)
Hemoglobin: 15.8 g/dL (ref 13.0–17.0)
Immature Granulocytes: 0 %
Lymphocytes Relative: 25 %
Lymphs Abs: 1.6 10*3/uL (ref 0.7–4.0)
MCH: 28.5 pg (ref 26.0–34.0)
MCHC: 32.4 g/dL (ref 30.0–36.0)
MCV: 88.1 fL (ref 80.0–100.0)
Monocytes Absolute: 0.5 10*3/uL (ref 0.1–1.0)
Monocytes Relative: 8 %
Neutro Abs: 3.9 10*3/uL (ref 1.7–7.7)
Neutrophils Relative %: 62 %
Platelets: 149 10*3/uL — ABNORMAL LOW (ref 150–400)
RBC: 5.54 MIL/uL (ref 4.22–5.81)
RDW: 16.5 % — ABNORMAL HIGH (ref 11.5–15.5)
WBC: 6.4 10*3/uL (ref 4.0–10.5)
nRBC: 0 % (ref 0.0–0.2)

## 2019-01-10 LAB — TROPONIN I
Troponin I: 0.07 ng/mL (ref <0.03)
Troponin I: 0.07 ng/mL (ref <0.03)

## 2019-01-10 LAB — BASIC METABOLIC PANEL
Anion gap: 12 (ref 5–15)
BUN: 25 mg/dL — ABNORMAL HIGH (ref 8–23)
CO2: 20 mmol/L — ABNORMAL LOW (ref 22–32)
Calcium: 9.2 mg/dL (ref 8.9–10.3)
Chloride: 108 mmol/L (ref 98–111)
Creatinine, Ser: 2.14 mg/dL — ABNORMAL HIGH (ref 0.61–1.24)
GFR calc Af Amer: 35 mL/min — ABNORMAL LOW (ref >60)
GFR calc non Af Amer: 30 mL/min — ABNORMAL LOW (ref >60)
Glucose, Bld: 120 mg/dL — ABNORMAL HIGH (ref 70–99)
Potassium: 3.6 mmol/L (ref 3.5–5.1)
Sodium: 140 mmol/L (ref 135–145)

## 2019-01-10 LAB — SARS CORONAVIRUS 2 BY RT PCR (HOSPITAL ORDER, PERFORMED IN ~~LOC~~ HOSPITAL LAB): SARS Coronavirus 2: NEGATIVE

## 2019-01-10 LAB — BRAIN NATRIURETIC PEPTIDE: B Natriuretic Peptide: 315 pg/mL — ABNORMAL HIGH (ref 0.0–100.0)

## 2019-01-10 MED ORDER — DOXYCYCLINE HYCLATE 100 MG PO TABS
100.0000 mg | ORAL_TABLET | Freq: Once | ORAL | Status: AC
  Administered 2019-01-10: 100 mg via ORAL
  Filled 2019-01-10: qty 1

## 2019-01-10 MED ORDER — ALBUTEROL SULFATE HFA 108 (90 BASE) MCG/ACT IN AERS
2.0000 | INHALATION_SPRAY | Freq: Once | RESPIRATORY_TRACT | Status: AC
  Administered 2019-01-10: 2 via RESPIRATORY_TRACT
  Filled 2019-01-10: qty 6.7

## 2019-01-10 MED ORDER — FLUTICASONE PROPIONATE 50 MCG/ACT NA SUSP: 2.0000 | Freq: Every day | NASAL | 0 refills | Status: DC

## 2019-01-10 MED ORDER — DOXYCYCLINE HYCLATE 100 MG PO CAPS: 100.0000 mg | ORAL_CAPSULE | Freq: Two times a day (BID) | ORAL | 0 refills | Status: AC

## 2019-01-10 MED ORDER — ALBUTEROL SULFATE HFA 108 (90 BASE) MCG/ACT IN AERS: 1.0000 | INHALATION_SPRAY | Freq: Four times a day (QID) | RESPIRATORY_TRACT | 0 refills | Status: DC | PRN

## 2019-01-10 NOTE — ED Triage Notes (Signed)
Pt presents to ED with complaints of SOB x few days. Pt states it has gotten worse today. Pt denies fever or any new cough.

## 2019-01-10 NOTE — ED Notes (Signed)
Date and time results received: 01/10/19 1921 (use smartphrase ".now" to insert current time)  Test: troponin Critical Value: 0.07  Name of Provider Notified: Dr Laverta Baltimore  Orders Received? Or Actions Taken?: Actions Taken: no orders received.

## 2019-01-10 NOTE — ED Notes (Signed)
Pt. Ambulated around the ED and stated he did not have any difficulty breathing and felt better then he did earlier staff didn't see any signs of shortness of breath while assisting Pt. Pt. O2 saturation fluctuated between 85-100%.

## 2019-01-10 NOTE — Discharge Instructions (Signed)
You were seen in the emergency department today with shortness of breath.  You improved after albuterol.  I am starting an antibiotic and have called in prescriptions for both along with Flonase which is a nasal spray.  Your coronavirus test was negative.  You need to return to the emergency department immediately if you develop worsening shortness of breath or develop chest pain, fever, other sudden worsening symptoms.

## 2019-01-10 NOTE — ED Provider Notes (Signed)
Emergency Department Provider Note   I have reviewed the triage vital signs and the nursing notes.   HISTORY  Chief Complaint Shortness of Breath   HPI [name] [name] is a 73 y.o. male with PMH of CAD, CKD, HTN, HLD, elevated BMI, tobacco use history and DM presents to the emergency department for evaluation of shortness of breath.  Symptoms are worsened over the past several days.  Patient describes some nasal congestion but denies any fever, cough, or chest pain.  Patient states he has had issues like this in the past with sinus congestion.  He states that he typically uses a saline nasal spray but ran out several days ago and symptoms seem to worsen around that time.  He is not been around known sick individuals.  He denies any abdominal pain, vomiting, diarrhea.  No diaphoresis.  He had been a smoker until 3 months ago when he quit.  Cough is slightly productive of clear sputum.  He has not noticed any lower extremity swelling. No hemoptysis.     Past Medical History:  Diagnosis Date   CAD (coronary artery disease)    DES to LAD June 2018   CKD (chronic kidney disease) stage 3, GFR 30-59 ml/min (HCC)    Complication of anesthesia    patient had an epidural with his right knee and it took him 6 hours before he could move, he does not want another one   Essential hypertension    History of pneumonia    Hyperlipidemia    Hypothyroidism    LBBB (left bundle branch block)    Morbid obesity ([place])    Non Hodgkin's lymphoma ([place])    Status post XRT and chemotherapy   NSTEMI (non-ST elevated myocardial infarction) [facility])    June 2018   Peripheral neuropathy    Sleep apnea    Type 2 diabetes mellitus [facility])     Patient Active Problem List   Diagnosis Date Noted   Primary osteoarthritis of left knee 05/24/2018   Primary localized osteoarthritis of left knee 05/19/2018   Acute on chronic systolic and diastolic heart failure, NYHA class 1 ([place]) 04/01/2017   CAD  (coronary artery disease) 04/01/2017   SOB (shortness of breath) 03/21/2017   HTN (hypertension) 03/21/2017   NSTEMI (non-ST elevated myocardial infarction) ([place]) 01/19/2017   NSTEMI, initial episode of care ([place]) 01/19/2017   Sepsis ([place]) 07/24/2016   Elevated troponin I level 07/24/2016   Fever 07/24/2016   Lactic acidosis 07/24/2016   Adjustment insomnia 05/18/2016   Erectile dysfunction 12/19/2015   Sleep apnea 09/30/2015   Osteoarthritis 09/30/2015   Hypothyroidism 09/30/2015   Hypogonadism in male 09/30/2015   Diabetic neuropathy ([place]) 09/30/2015   Chronic pain of left knee 09/30/2015   Benign essential hypertension 09/30/2015   Acute on chronic kidney failure ([place]) 05/14/2015   Diarrhea 05/14/2015   Dehydration 05/14/2015   Hypokalemia 05/14/2015   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   Pelvic mass in male    Varices, esophageal (HCC)    Colonic mass    Abnormal CT scan, pelvis    Bladder mass    Elevated liver enzymes    Elevated LFTs    Abdominal pain 07/23/2014   Chronic kidney disease Stage IV 07/23/2014   Morbid obesity ([place]) 07/23/2014   Type 2 diabetes mellitus with stage 4 chronic kidney disease ([place]) 07/23/2014   Hypertension 07/23/2014   S/P total knee replacement, 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    Past Surgical History:  Procedure Laterality  Date   CHOLECYSTECTOMY  1992   COLONOSCOPY N/A 09/03/2014   SLF:six colon polyps removed/small internal hemorrhoids   CORONARY STENT INTERVENTION N/A 01/21/2017   Procedure: Coronary Stent Intervention;  Surgeon: Nelva Bush, MD;  Location: [place] [facility];  Service: Cardiovascular;  Laterality: N/A;   ESOPHAGOGASTRODUODENOSCOPY N/A 09/03/2014   SLF: mild gastritis/few gastric polyps   LEFT HEART CATH AND CORONARY ANGIOGRAPHY N/A 01/20/2017   Procedure: Left Heart Cath and Coronary Angiography;  Surgeon: Jettie Booze, MD;  Location: [place] [facility];  Service:  Cardiovascular;  Laterality: N/A;   TOTAL KNEE ARTHROPLASTY  11/10/2011   Procedure: TOTAL KNEE ARTHROPLASTY;  Surgeon: Mauri Pole, MD;  [facility];  Service: Orthopedics;  Laterality: Right;   TOTAL KNEE ARTHROPLASTY Left 05/24/2018   Procedure: LEFT TOTAL KNEE ARTHROPLASTY;  Surgeon: Melrose Nakayama, MD;  Location: [place];  Service: Orthopedics;  Laterality: Left;    Allergies Patient has no known allergies.  Family History  Problem Relation Age of Onset   Cancer Mother        breast and lung   Cancer Father        bladder   Cancer Maternal Uncle        prostate   Cancer Paternal Uncle        esophagus   Colon cancer Neg Hx     Social History Social History   Tobacco Use   Smoking status: Current Some Day Smoker    Packs/day: 0.25    Years: 30.00    Pack years: 7.50    Types: Cigarettes    Last attempt to quit: 08/04/2007    Years since quitting: 11.4   Smokeless tobacco: Never Used   Tobacco comment: "smoke a cigarette every now and then"  Substance Use Topics   Alcohol use: No    Alcohol/week: 0.0 standard drinks   Drug use: No    Review of Systems  Constitutional: No fever/chills Eyes: No visual changes. ENT: No sore throat. Positive nasal congestion.  Cardiovascular: Denies chest pain. Respiratory: Positive shortness of breath. Gastrointestinal: No abdominal pain.  No nausea, no vomiting.  No diarrhea.  No constipation. Genitourinary: Negative for dysuria. Musculoskeletal: Negative for back pain. Skin: Negative for rash. Neurological: Negative for headaches, focal weakness or numbness.  10-point ROS otherwise negative.  ____________________________________________   PHYSICAL EXAM:  VITAL SIGNS: ED Triage Vitals  Enc Vitals Group     BP 01/10/19 1722 (!) 179/89     Pulse Rate 01/10/19 1722 (!) 50     Resp 01/10/19 1722 16     Temp 01/10/19 1722 98.4 F (36.9 C)     Temp Source 01/10/19 1722 Oral     SpO2 --      Weight  01/10/19 1723 300 lb (136.1 kg)     Height 01/10/19 1723 6' (1.829 m)     Pain Score 01/10/19 1723 0   Constitutional: Alert and oriented. Well appearing and in no acute distress. Eyes: Conjunctivae are normal.  Head: Atraumatic. Nose: No congestion/rhinnorhea. Mouth/Throat: Mucous membranes are moist.  Neck: No stridor.   Cardiovascular: Sinus bradycardia. Good peripheral circulation. Grossly normal heart sounds.   Respiratory: Normal respiratory effort.  No retractions. Lungs CTAB. Gastrointestinal: Soft and nontender. No distention.  Musculoskeletal: No lower extremity tenderness nor edema. No gross deformities of extremities. Neurologic:  Normal speech and language. No gross focal neurologic deficits are appreciated.  Skin:  Skin is warm, dry and intact. No rash noted.  ____________________________________________   LABS (all labs ordered are listed, but only abnormal results are displayed)  Labs Reviewed  BASIC METABOLIC PANEL - Abnormal; Notable for the following components:      Result Value   CO2 20 (*)    Glucose, Bld 120 (*)    BUN 25 (*)    Creatinine, Ser 2.14 (*)    GFR calc non Af Amer 30 (*)    GFR calc Af Amer 35 (*)    All other components within normal limits  CBC WITH DIFFERENTIAL/PLATELET - Abnormal; Notable for the following components:   RDW 16.5 (*)    Platelets 149 (*)    All other components within normal limits  BRAIN NATRIURETIC PEPTIDE - Abnormal; Notable for the following components:   B Natriuretic Peptide 315.0 (*)    All other components within normal limits  TROPONIN I - Abnormal; Notable for the following components:   Troponin I 0.07 (*)    All other components within normal limits  TROPONIN I - Abnormal; Notable for the following components:   Troponin I 0.07 (*)    All other components within normal limits  SARS CORONAVIRUS 2 (HOSPITAL ORDER, [place] [facility])    ____________________________________________  EKG   EKG Interpretation  Date/Time:  Tuesday January 10 2019 17:31:46 EDT Ventricular Rate:  51 PR Interval:  300 QRS Duration: 144 QT Interval:  476 QTC Calculation: 438 R Axis:   -61 Text Interpretation:  Sinus bradycardia with 1st degree A-V block Left axis deviation Left bundle branch block Abnormal ECG No STEMI. Similar to Oct  2019 tracing.  Confirmed by Nanda Quinton [phone]) on 01/10/2019 5:39:53 PM       ____________________________________________  RADIOLOGY  Dg Chest Port 1 View  Result Date: 01/10/2019 CLINICAL DATA:  Shortness of breath EXAM: PORTABLE CHEST 1 VIEW COMPARISON:  05/13/2018 FINDINGS: Mild cardiomegaly. Increased perihilar interstitial opacity. No effusion. No pneumothorax. IMPRESSION: Cardiomegaly. Mild increased perihilar interstitial opacity, could be seen with central airways inflammatory process, or possible atypical/viral infection. Electronically Signed   By: Donavan Foil M.D.   On: 01/10/2019 18:22    ____________________________________________   PROCEDURES  Procedure(s) performed:   Procedures  None  ____________________________________________   INITIAL IMPRESSION / ASSESSMENT AND PLAN / ED COURSE  Pertinent labs & imaging results that were available during my care of the patient were reviewed by me and considered in my medical decision making (see chart for details).   Patient presents to the emergency department with shortness of breath with some nasal congestion.  No chest pain.  EKG shows sinus bradycardia with first-degree AV block which is similar to prior EKGs a little for comparison.  Lower suspicion for atypical ACS presentation or PE.  No hypoxemia here.  Very low suspicion for COVID.  Given the patient's multiple medical comorbidities, however, plan for chest x-ray along with screening lab work including troponin and BNP.  Will give albuterol inhaler here and reassess after labs and  imaging result.  Patient labs and imaging reviewed. Troponin chronically elevated and unchanged in the ED here. Patient feeling better after neb. CXR with possible atypical infection/bronchitis. Will start Doxy and Albuterol with Flonase. COVID negative here. Patient ambulatory with some desaturation but no subjective SOB. Discussed obs admit with hypoxemia on ambulation. Patient reports feeling much better and would like to return home. Will discuss O2 with PCP. Discussed ED return precautions.  ____________________________________________  FINAL CLINICAL IMPRESSION(S) / ED DIAGNOSES  Final diagnoses:  Shortness of breath  Bronchitis     MEDICATIONS GIVEN DURING THIS VISIT:  Medications  albuterol (VENTOLIN HFA) 108 (90 Base) MCG/ACT inhaler 2 puff (2 puffs Inhalation Given 01/10/19 1828)  doxycycline (VIBRA-TABS) tablet 100 mg (100 mg Oral Given 01/10/19 2227)     NEW OUTPATIENT MEDICATIONS STARTED DURING THIS VISIT:  Discharge Medication List as of 01/10/2019 10:13 PM    START taking these medications   Details  doxycycline (VIBRAMYCIN) 100 MG capsule Take 1 capsule (100 mg total) by mouth 2 (two) times daily for 7 days., Starting Tue 01/10/2019, Until Tue 01/17/2019, Normal    fluticasone (FLONASE) 50 MCG/ACT nasal spray Place 2 sprays into both nostrils daily for 14 days., Starting Tue 01/10/2019, Until Tue 01/24/2019, Normal        Note:  This document was prepared using Dragon voice recognition software and may include unintentional dictation errors.  Nanda Quinton, MD Emergency Medicine    Percy Winterrowd, [facility], MD 01/11/19 [phone]

## 2019-01-10 NOTE — ED Notes (Addendum)
Date and time results received: 01/10/19 (use smartphrase ".now" to insert current time)  Test:Critical Value:   Name of Provider Notified: Orders Received? Or Actions Taken?: Actions Taken: no orders received

## 2019-03-06 ENCOUNTER — Encounter: Payer: Self-pay | Admitting: *Deleted

## 2019-04-17 ENCOUNTER — Ambulatory Visit: Payer: Medicare HMO

## 2019-04-17 ENCOUNTER — Telehealth: Payer: Self-pay | Admitting: *Deleted

## 2019-04-17 ENCOUNTER — Encounter: Payer: Self-pay | Admitting: *Deleted

## 2019-04-17 NOTE — Telephone Encounter (Signed)
Noted  

## 2019-04-17 NOTE — Telephone Encounter (Signed)
PATIENT WAS A NO SHOW AND LETTER SENT  °

## 2019-04-20 ENCOUNTER — Telehealth: Payer: Self-pay | Admitting: *Deleted

## 2019-04-20 NOTE — Telephone Encounter (Signed)
Pt called back and said that he received a letter from [place] that he had missed his appointment.  He said that he was unaware of this appointment.  I offered to reschedule him but he did not want to.

## 2019-07-07 ENCOUNTER — Inpatient Hospital Stay (HOSPITAL_COMMUNITY): Payer: Medicare HMO | Attending: Hematology

## 2019-07-07 ENCOUNTER — Ambulatory Visit (HOSPITAL_COMMUNITY): Admission: RE | Admit: 2019-07-07 | Payer: Medicare HMO | Source: Ambulatory Visit

## 2019-07-11 ENCOUNTER — Ambulatory Visit (HOSPITAL_COMMUNITY): Payer: Medicare HMO | Admitting: Nurse Practitioner

## 2019-08-01 ENCOUNTER — Ambulatory Visit (HOSPITAL_COMMUNITY)
Admission: RE | Admit: 2019-08-01 | Discharge: 2019-08-01 | Disposition: A | Discharge status: Home / Self Care | Payer: Medicare HMO | Source: Ambulatory Visit | Attending: Nurse Practitioner | Admitting: Nurse Practitioner

## 2019-08-01 ENCOUNTER — Other Ambulatory Visit: Payer: Self-pay

## 2019-08-01 ENCOUNTER — Inpatient Hospital Stay (HOSPITAL_COMMUNITY): Payer: Medicare HMO | Attending: Hematology

## 2019-08-01 DIAGNOSIS — N189 Chronic kidney disease, unspecified: Secondary | ICD-10-CM | POA: Diagnosis not present

## 2019-08-01 DIAGNOSIS — Z8572 Personal history of non-Hodgkin lymphomas: Secondary | ICD-10-CM | POA: Diagnosis present

## 2019-08-01 DIAGNOSIS — C8580 Other specified types of non-Hodgkin lymphoma, unspecified site: Secondary | ICD-10-CM | POA: Insufficient documentation

## 2019-08-01 LAB — COMPREHENSIVE METABOLIC PANEL
ALT: 17 U/L (ref 0–44)
AST: 18 U/L (ref 15–41)
Albumin: 3.9 g/dL (ref 3.5–5.0)
Alkaline Phosphatase: 78 U/L (ref 38–126)
Anion gap: 10 (ref 5–15)
BUN: 23 mg/dL (ref 8–23)
CO2: 24 mmol/L (ref 22–32)
Calcium: 8.8 mg/dL — ABNORMAL LOW (ref 8.9–10.3)
Chloride: 107 mmol/L (ref 98–111)
Creatinine, Ser: 2.01 mg/dL — ABNORMAL HIGH (ref 0.61–1.24)
GFR calc Af Amer: 37 mL/min — ABNORMAL LOW (ref >60)
GFR calc non Af Amer: 32 mL/min — ABNORMAL LOW (ref >60)
Glucose, Bld: 174 mg/dL — ABNORMAL HIGH (ref 70–99)
Potassium: 3.9 mmol/L (ref 3.5–5.1)
Sodium: 141 mmol/L (ref 135–145)
Total Bilirubin: 1.4 mg/dL — ABNORMAL HIGH (ref 0.3–1.2)
Total Protein: 7.4 g/dL (ref 6.5–8.1)

## 2019-08-01 LAB — CBC WITH DIFFERENTIAL/PLATELET
Abs Immature Granulocytes: 0.04 10*3/uL (ref 0.00–0.07)
Basophils Absolute: 0.1 10*3/uL (ref 0.0–0.1)
Basophils Relative: 1 %
Eosinophils Absolute: 0.3 10*3/uL (ref 0.0–0.5)
Eosinophils Relative: 5 %
HCT: 47.7 % (ref 39.0–52.0)
Hemoglobin: 15.6 g/dL (ref 13.0–17.0)
Immature Granulocytes: 1 %
Lymphocytes Relative: 26 %
Lymphs Abs: 1.8 10*3/uL (ref 0.7–4.0)
MCH: 29.9 pg (ref 26.0–34.0)
MCHC: 32.7 g/dL (ref 30.0–36.0)
MCV: 91.6 fL (ref 80.0–100.0)
Monocytes Absolute: 0.4 10*3/uL (ref 0.1–1.0)
Monocytes Relative: 6 %
Neutro Abs: 4.3 10*3/uL (ref 1.7–7.7)
Neutrophils Relative %: 61 %
Platelets: 143 10*3/uL — ABNORMAL LOW (ref 150–400)
RBC: 5.21 MIL/uL (ref 4.22–5.81)
RDW: 15.9 % — ABNORMAL HIGH (ref 11.5–15.5)
WBC: 7 10*3/uL (ref 4.0–10.5)
nRBC: 0 % (ref 0.0–0.2)

## 2019-08-01 LAB — LACTATE DEHYDROGENASE: LDH: 143 U/L (ref 98–192)

## 2019-08-01 MED ORDER — IOHEXOL 300 MG/ML  SOLN
75.0000 mL | Freq: Once | INTRAMUSCULAR | Status: AC | PRN
  Administered 2019-08-01: 75 mL via INTRAVENOUS

## 2019-08-08 ENCOUNTER — Inpatient Hospital Stay (HOSPITAL_COMMUNITY): Payer: Medicare HMO | Attending: Hematology | Admitting: Hematology

## 2019-08-31 ENCOUNTER — Other Ambulatory Visit (HOSPITAL_COMMUNITY): Payer: Self-pay | Admitting: Nurse Practitioner

## 2019-09-22 ENCOUNTER — Other Ambulatory Visit (HOSPITAL_COMMUNITY): Payer: Self-pay | Admitting: *Deleted

## 2019-09-22 DIAGNOSIS — D696 Thrombocytopenia, unspecified: Secondary | ICD-10-CM

## 2019-09-22 DIAGNOSIS — C8580 Other specified types of non-Hodgkin lymphoma, unspecified site: Secondary | ICD-10-CM

## 2019-09-26 ENCOUNTER — Inpatient Hospital Stay (HOSPITAL_COMMUNITY): Payer: Medicare HMO | Attending: Hematology

## 2019-09-26 DIAGNOSIS — N189 Chronic kidney disease, unspecified: Secondary | ICD-10-CM | POA: Diagnosis not present

## 2019-09-26 DIAGNOSIS — C8580 Other specified types of non-Hodgkin lymphoma, unspecified site: Secondary | ICD-10-CM

## 2019-09-26 DIAGNOSIS — D696 Thrombocytopenia, unspecified: Secondary | ICD-10-CM | POA: Diagnosis not present

## 2019-09-26 DIAGNOSIS — R161 Splenomegaly, not elsewhere classified: Secondary | ICD-10-CM | POA: Diagnosis not present

## 2019-09-26 LAB — COMPREHENSIVE METABOLIC PANEL
ALT: 13 U/L (ref 0–44)
AST: 15 U/L (ref 15–41)
Albumin: 3.9 g/dL (ref 3.5–5.0)
Alkaline Phosphatase: 78 U/L (ref 38–126)
Anion gap: 9 (ref 5–15)
BUN: 27 mg/dL — ABNORMAL HIGH (ref 8–23)
CO2: 26 mmol/L (ref 22–32)
Calcium: 8.9 mg/dL (ref 8.9–10.3)
Chloride: 102 mmol/L (ref 98–111)
Creatinine, Ser: 2.26 mg/dL — ABNORMAL HIGH (ref 0.61–1.24)
GFR calc Af Amer: 32 mL/min — ABNORMAL LOW (ref >60)
GFR calc non Af Amer: 28 mL/min — ABNORMAL LOW (ref >60)
Glucose, Bld: 213 mg/dL — ABNORMAL HIGH (ref 70–99)
Potassium: 4 mmol/L (ref 3.5–5.1)
Sodium: 137 mmol/L (ref 135–145)
Total Bilirubin: 1.2 mg/dL (ref 0.3–1.2)
Total Protein: 7 g/dL (ref 6.5–8.1)

## 2019-09-26 LAB — CBC WITH DIFFERENTIAL/PLATELET
Abs Immature Granulocytes: 0.02 10*3/uL (ref 0.00–0.07)
Basophils Absolute: 0.1 10*3/uL (ref 0.0–0.1)
Basophils Relative: 1 %
Eosinophils Absolute: 0.3 10*3/uL (ref 0.0–0.5)
Eosinophils Relative: 4 %
HCT: 46.2 % (ref 39.0–52.0)
Hemoglobin: 15.1 g/dL (ref 13.0–17.0)
Immature Granulocytes: 0 %
Lymphocytes Relative: 23 %
Lymphs Abs: 1.5 10*3/uL (ref 0.7–4.0)
MCH: 30 pg (ref 26.0–34.0)
MCHC: 32.7 g/dL (ref 30.0–36.0)
MCV: 91.8 fL (ref 80.0–100.0)
Monocytes Absolute: 0.4 10*3/uL (ref 0.1–1.0)
Monocytes Relative: 6 %
Neutro Abs: 4.2 10*3/uL (ref 1.7–7.7)
Neutrophils Relative %: 66 %
Platelets: 137 10*3/uL — ABNORMAL LOW (ref 150–400)
RBC: 5.03 MIL/uL (ref 4.22–5.81)
RDW: 15.3 % (ref 11.5–15.5)
WBC: 6.5 10*3/uL (ref 4.0–10.5)
nRBC: 0 % (ref 0.0–0.2)

## 2019-09-26 LAB — LACTATE DEHYDROGENASE: LDH: 128 U/L (ref 98–192)

## 2019-09-27 ENCOUNTER — Encounter (HOSPITAL_COMMUNITY): Payer: Self-pay | Admitting: Hematology

## 2019-09-27 ENCOUNTER — Other Ambulatory Visit: Payer: Self-pay

## 2019-09-27 ENCOUNTER — Inpatient Hospital Stay (HOSPITAL_BASED_OUTPATIENT_CLINIC_OR_DEPARTMENT_OTHER): Payer: Medicare HMO | Admitting: Hematology

## 2019-09-27 DIAGNOSIS — C8580 Other specified types of non-Hodgkin lymphoma, unspecified site: Secondary | ICD-10-CM

## 2019-09-27 NOTE — Progress Notes (Signed)
Virtual Visit via Telephone Note  I connected with [name] [name] on 09/27/19 at  2:20 PM EST by telephone and verified that I am speaking with the correct person using two identifiers.   I discussed the limitations, risks, security and privacy concerns of performing an evaluation and management service by telephone and the availability of in person appointments. I also discussed with the patient that there may be a patient responsible charge related to this service. The patient expressed understanding and agreed to proceed.   History of Present Illness: He is seen for follow-up of stage II marginal zone lymphoma which was initially diagnosed on 09/27/2014 by biopsy of the mass adjacent to the bladder.  He reportedly had severe nausea and vomiting at the time of diagnosis.  He underwent some radiation therapy and rituximab weekly x4 followed by maintenance rituximab completed from 10/31/2014 through 12/08/2016.   Observations/Objective: He denies any fevers, night sweats or weight loss in the last 6 months.  Denies any infections or hospitalizations.  Appetite is 100%.  Energy levels are 50%.  No new onset pains reported.  Denies any vomiting or abdominal pain.  Assessment and Plan:  1.  Stage II marginal zone lymphoma: -He does not report any B symptoms. -I reviewed his labs including LDH which was normal. -I have independently reviewed the CT scan of the abdomen and pelvis from 08/01/2019 which showed numerous prominent and enlarged mediastinal, retroperitoneal, iliac and inguinal lymph nodes unchanged from prior PET scan from January 2019.  Unchanged splenomegaly measuring 16.9 cm.  Unchanged thickening and tenting of the left aspect of the bladder dome.  Stable small left pleural effusion. -Labs from 09/26/2019 reviewed by me shows normal LDH at 128.  Hemoglobin and white count was normal.  2.  Mild thrombocytopenia: -Platelet count on CBC from 09/26/2019 was 137.  He denies any easy bruising or  bleeding. -He had mild thrombocytopenia for many years likely from splenomegaly.  3.  CKD: -His creatinine is 2.26 and at his baseline.  Follow Up Instructions: RTC 1 year with labs.   I discussed the assessment and treatment plan with the patient. The patient was provided an opportunity to ask questions and all were answered. The patient agreed with the plan and demonstrated an understanding of the instructions.   The patient was advised to call back or seek an in-person evaluation if the symptoms worsen or if the condition fails to improve as anticipated.  I provided 11 minutes of non-face-to-face time during this encounter.   Derek Jack, MD

## 2019-11-01 ENCOUNTER — Ambulatory Visit: Payer: Medicare HMO | Admitting: Cardiology

## 2019-11-01 NOTE — Progress Notes (Deleted)
Cardiology Office Note  Date: 11/01/2019   ID: [name] [name], [name] [name] 1945/09/08, MRN [id number]  PCP:  [name]  Cardiologist:  Rozann Lesches, MD Electrophysiologist:  None   No chief complaint on file.   History of Present Illness: [name] [name] is a 74 y.o. male last seen in May 2019.  Follow-up echocardiogram in May 2019 revealed LVEF 55 to 60% with moderate LVH, mildly ectatic aortic root.  Past Medical History:  Diagnosis Date  . CAD (coronary artery disease)    DES to LAD June 2018  . CKD (chronic kidney disease) stage 3, GFR 30-59 ml/min   . Complication of anesthesia    patient had an epidural with his right knee and it took him 6 hours before he could move, he does not want another one  . Essential hypertension   . History of pneumonia   . Hyperlipidemia   . Hypothyroidism   . LBBB (left bundle branch block)   . Morbid obesity ([place])   . Non Hodgkin's lymphoma ([place])    Status post XRT and chemotherapy  . NSTEMI (non-ST elevated myocardial infarction) [facility])    June 2018  . Peripheral neuropathy   . Sleep apnea   . Type 2 diabetes mellitus ([place])     Past Surgical History:  Procedure Laterality Date  . CHOLECYSTECTOMY  1992  . COLONOSCOPY N/A 09/03/2014   SLF:six colon polyps removed/small internal hemorrhoids  . CORONARY STENT INTERVENTION N/A 01/21/2017   Procedure: Coronary Stent Intervention;  Surgeon: Nelva Bush, MD;  Location: [place] [facility];  Service: Cardiovascular;  Laterality: N/A;  . ESOPHAGOGASTRODUODENOSCOPY N/A 09/03/2014   SLF: mild gastritis/few gastric polyps  . LEFT HEART CATH AND CORONARY ANGIOGRAPHY N/A 01/20/2017   Procedure: Left Heart Cath and Coronary Angiography;  Surgeon: Jettie Booze, MD;  Location: [place] [facility];  Service: Cardiovascular;  Laterality: N/A;  . TOTAL KNEE ARTHROPLASTY  11/10/2011   Procedure: TOTAL KNEE ARTHROPLASTY;  Surgeon: Mauri Pole, MD;  [facility];   Service: Orthopedics;  Laterality: Right;  . TOTAL KNEE ARTHROPLASTY Left 05/24/2018   Procedure: LEFT TOTAL KNEE ARTHROPLASTY;  Surgeon: Melrose Nakayama, MD;  Location: [facility];  Service: Orthopedics;  Laterality: Left;    Current Outpatient Medications  Medication Sig Dispense Refill  . acetaminophen (TYLENOL) 325 MG tablet Take 650 mg by mouth every 6 (six) hours as needed.    Marland Kitchen albuterol (VENTOLIN HFA) 108 (90 Base) MCG/ACT inhaler Inhale 1-2 puffs into the lungs every 6 (six) hours as needed for wheezing or shortness of breath. (Patient not taking: Reported on 09/27/2019) 1 Inhaler 0  . amLODipine (NORVASC) 10 MG tablet Take 10 mg by mouth daily.    Marland Kitchen aspirin EC 325 MG EC tablet Take 1 tablet (325 mg total) by mouth 2 (two) times daily after a meal. 30 tablet 0  . bisoprolol (ZEBETA) 5 MG tablet Take 2.5 mg by mouth daily.    . budesonide-formoterol (SYMBICORT) 80-4.5 MCG/ACT inhaler Inhale 2 puffs into the lungs daily as needed (shortness of breath).     . fluticasone (FLONASE) 50 MCG/ACT nasal spray Place 2 sprays into both nostrils daily for 14 days. (Patient taking differently: Place 2 sprays into both nostrils as needed. ) 1 g 0  . furosemide (LASIX) 40 MG tablet Take 40 mg by mouth daily.    . insulin degludec (TRESIBA FLEXTOUCH) 100 UNIT/ML SOPN FlexTouch Pen Inject 0.5 mLs (50 Units total) into  the skin daily at 10 pm. (Patient taking differently: Inject 60 Units into the skin daily at 10 pm. )    . isosorbide mononitrate (IMDUR) 30 MG 24 hr tablet Take 1 tablet (30 mg total) by mouth daily. 30 tablet 1  . levocetirizine (XYZAL) 5 MG tablet Take 5 mg by mouth every evening.     Marland Kitchen levothyroxine (SYNTHROID, LEVOTHROID) 200 MCG tablet Take 300 mcg by mouth daily before breakfast.     . losartan-hydrochlorothiazide (HYZAAR) 100-25 MG tablet Take 1 tablet by mouth daily.    . nitroGLYCERIN (NITROSTAT) 0.4 MG SL tablet Place 0.4 mg under the tongue every 5 (five) minutes as needed for chest  pain.   11  . potassium chloride SA (K-DUR,KLOR-CON) 20 MEQ tablet Take 20 mEq by mouth daily.    . simvastatin (ZOCOR) 5 MG tablet Take 5 mg by mouth daily.    . traZODone (DESYREL) 50 MG tablet Take 50 mg by mouth as needed.      No current facility-administered medications for this visit.   Allergies:  Patient has no known allergies.   Social History: The patient  reports that he has been smoking cigarettes. He has a 7.50 pack-year smoking history. He has never used smokeless tobacco. He reports that he does not drink alcohol or use drugs.   Family History: The patient's family history includes Cancer in his father, maternal uncle, mother, and paternal uncle.   ROS:  Please see the history of present illness. Otherwise, complete review of systems is positive for {NONE DEFAULTED:18576::"none"}.  All other systems are reviewed and negative.   Physical Exam: VS:  There were no vitals taken for this visit., BMI There is no height or weight on file to calculate BMI.  Wt Readings from Last 3 Encounters:  01/10/19 300 lb (136.1 kg)  01/03/19 299 lb 4 oz (135.7 kg)  05/24/18 297 lb (134.7 kg)    General: Patient appears comfortable at rest. HEENT: Conjunctiva and lids normal, oropharynx clear with moist mucosa. Neck: Supple, no elevated JVP or carotid bruits, no thyromegaly. Lungs: Clear to auscultation, nonlabored breathing at rest. Cardiac: Regular rate and rhythm, no S3 or significant systolic murmur, no pericardial rub. Abdomen: Soft, nontender, no hepatomegaly, bowel sounds present, no guarding or rebound. Extremities: No pitting edema, distal pulses 2+. Skin: Warm and dry. Musculoskeletal: No kyphosis. Neuropsychiatric: Alert and oriented x3, affect grossly appropriate.  ECG:  An ECG dated 01/10/2019 was personally reviewed today and demonstrated:  Sinus bradycardia with prolonged PR interval and left bundle branch block.  Recent Labwork: 01/10/2019: B Natriuretic Peptide  315.0 09/26/2019: ALT 13; AST 15; BUN 27; Creatinine, Ser 2.26; Hemoglobin 15.1; Platelets 137; Potassium 4.0; Sodium 137     Component Value Date/Time   CHOL 116 01/20/2017 1130   TRIG 174 (H) 01/20/2017 1130   HDL 31 (L) 01/20/2017 1130   CHOLHDL 3.7 01/20/2017 1130   VLDL 35 01/20/2017 1130   LDLCALC 50 01/20/2017 1130    Other Studies Reviewed Today:  Echocardiogram 12/28/2017: Study Conclusions   - Left ventricle: The cavity size was normal. Wall thickness was  increased in a pattern of moderate LVH. Images are limited,  therefore Definity contrast was used. Systolic function was  normal. The estimated ejection fraction was in the range of 55%  to 60%. Wall motion was normal; there were no regional wall  motion abnormalities. Indeterminate diastolic function, possibly  grade 2.  - Aortic valve: Mildly calcified annulus. Trileaflet; mildly  calcified  leaflets. There was trivial regurgitation.  - Aortic root: The aortic root was mildly ectatic.  - Mitral valve: Mildly calcified annulus. There was trivial  regurgitation.  - Right atrium: Central venous pressure (est): 3 mm Hg.  - Tricuspid valve: There was trivial regurgitation.  - Pulmonary arteries: Systolic pressure could not be accurately  estimated.  - Pericardium, extracardiac: There was no pericardial effusion.  Cardiac catheterization 01/20/2017:  1st Mrg lesion, 100 %stenosed. This is the culprit for his recent MI.  Ramus lesion, 75 %stenosed. This is a relatively small vessel.  Mid LAD lesion, 80 %stenosed.  LV end diastolic pressure is moderately elevated.  There is no aortic valve stenosis.  Completed MI from occluded obtuse marginal. Patient has been pain free and feels only fatigued. We elected not to attempt intervention given the time course of his enzymes.  Plan for PCI of the LAD tomorrow if renal function is stable.  Coronary PCI 01/21/2017: Conclusions: 1. Significant  multivessel coronary artery disease (see yesterday's diagnostic catheterization for details). 2. Successful PCI to mid LAD with placement of a Synergy 3.0 x 32 mm drug-eluting stent with 0% residual stenosis and TIMI-3 flow.  Recommendations: 1. Dual antiplatelet therapy with aspirin and clopidogrel for at least 12 months. 2. Aggressive secondary prevention. 3. If chest pain recurs despite optimal medical therapy, PCI to ramus and/or distal LAD could be considered.  Assessment and Plan:   Medication Adjustments/Labs and Tests Ordered: Current medicines are reviewed at length with the patient today.  Concerns regarding medicines are outlined above.   Tests Ordered: No orders of the defined types were placed in this encounter.   Medication Changes: No orders of the defined types were placed in this encounter.   Disposition:  Follow up {follow up:15908}  Signed, Satira Sark, MD, [facility] 11/01/2019 7:58 AM     [facility] [address] [place], [place] [address] Phone: [phone]; Fax: [phone]

## 2019-11-15 ENCOUNTER — Ambulatory Visit (INDEPENDENT_AMBULATORY_CARE_PROVIDER_SITE_OTHER): Payer: Medicare Other | Admitting: Cardiology

## 2019-11-15 ENCOUNTER — Other Ambulatory Visit: Payer: Self-pay

## 2019-11-15 ENCOUNTER — Encounter: Payer: Self-pay | Admitting: Cardiology

## 2019-11-15 VITALS — BP 144/78 | HR 79 | Temp 98.9°F | Ht 72.0 in | Wt 302.0 lb

## 2019-11-15 DIAGNOSIS — G4733 Obstructive sleep apnea (adult) (pediatric): Secondary | ICD-10-CM

## 2019-11-15 DIAGNOSIS — E782 Mixed hyperlipidemia: Secondary | ICD-10-CM | POA: Diagnosis not present

## 2019-11-15 DIAGNOSIS — J9692 Respiratory failure, unspecified with hypercapnia: Secondary | ICD-10-CM | POA: Diagnosis not present

## 2019-11-15 DIAGNOSIS — I25119 Atherosclerotic heart disease of native coronary artery with unspecified angina pectoris: Secondary | ICD-10-CM | POA: Diagnosis not present

## 2019-11-15 NOTE — Patient Instructions (Signed)
Medication Instructions:  Your physician recommends that you continue on your current medications as directed. Please refer to the Current Medication list given to you today.  *If you need a refill on your cardiac medications before your next appointment, please call your pharmacy*   Lab Work: None today If you have labs (blood work) drawn today and your tests are completely normal, you will receive your results only by: Marland Kitchen MyChart Message (if you have MyChart) OR . A paper copy in the mail If you have any lab test that is abnormal or we need to change your treatment, we will call you to review the results.   Testing/Procedures: Your physician has requested that you have an echocardiogram. Echocardiography is a painless test that uses sound waves to create images of your heart. It provides your doctor with information about the size and shape of your heart and how well your heart's chambers and valves are working. This procedure takes approximately one hour. There are no restrictions for this procedure.     Follow-Up: At [facility], you and your health needs are our priority.  As part of our continuing mission to provide you with exceptional heart care, we have created designated Provider Care Teams.  These Care Teams include your primary Cardiologist (physician) and Advanced Practice Providers (APPs -  Physician Assistants and Nurse Practitioners) who all work together to provide you with the care you need, when you need it.  We recommend signing up for the patient portal called "MyChart".  Sign up information is provided on this After Visit Summary.  MyChart is used to connect with patients for Virtual Visits (Telemedicine).  Patients are able to view lab/test results, encounter notes, upcoming appointments, etc.  Non-urgent messages can be sent to your provider as well.   To learn more about what you can do with MyChart, go to [email or web address].    Your next appointment:   6  month(s)  The format for your next appointment:   In Person  Provider:   Rozann Lesches, MD   Other Instructions We have referred you to [facility] in [place].They will call you to schedule appointment.        Thank you for choosing [facility] !

## 2019-11-15 NOTE — Progress Notes (Signed)
Cardiology Office Note  Date: 11/15/2019   ID: Hobert, [place] [birth year], MRN [id number]  PCP:  [name]  Cardiologist:  Rozann Lesches, MD Electrophysiologist:  None   Chief Complaint  Patient presents with  . Cardiac follow-up    History of Present Illness: [name] [name] is a 74 y.o. male last seen in May 2019.  He presents overdue for follow-up.  From a cardiac perspective, he does not report any obvious angina symptoms.  He states that he has been taking his medications regularly as outlined below.  He is limited by chronic knee pain bilaterally, uses a cane, has had bilateral knee surgery.  Has also felt more short of breath over the last month or so.  He does not report any cough or hemoptysis.  I see that he was evaluated by his PCP recently and has had recurring hypoxic respiratory failure.  Did have a negative VQ scan back in October 2020.  Chest x-ray at that time indicated increased interstitial markings.  He had a chest CT in December 2020 that reported prominent and enlarged mediastinal, retroperitoneal, iliac, and inguinal lymph nodes, small left pleural effusion.  He has a history of marginal zone lymphoma, followed by Dr. Delton Coombes in the oncology clinic.  No definite history of chronic lung disease but he does have OSA.  Oxygen saturation was 85% when first walking in, improved into the low 90s with rest.  Follow-up echocardiogram from May 2019 revealed normal LVEF at 55 to 60%.  I personally reviewed his ECG from June 2020 as outlined below.  Past Medical History:  Diagnosis Date  . CAD (coronary artery disease)    DES to LAD June 2018  . CKD (chronic kidney disease) stage 3, GFR 30-59 ml/min   . Essential hypertension   . History of pneumonia   . Hyperlipidemia   . Hypothyroidism   . LBBB (left bundle branch block)   . Morbid obesity ([place])   . Non Hodgkin's lymphoma ([place])    Status post XRT and chemotherapy  . NSTEMI  (non-ST elevated myocardial infarction) [facility])    June 2018  . Peripheral neuropathy   . Sleep apnea   . Type 2 diabetes mellitus ([place])     Past Surgical History:  Procedure Laterality Date  . CHOLECYSTECTOMY  1992  . COLONOSCOPY N/A 09/03/2014   SLF:six colon polyps removed/small internal hemorrhoids  . CORONARY STENT INTERVENTION N/A 01/21/2017   Procedure: Coronary Stent Intervention;  Surgeon: Nelva Bush, MD;  Location: [facility];  Service: Cardiovascular;  Laterality: N/A;  . ESOPHAGOGASTRODUODENOSCOPY N/A 09/03/2014   SLF: mild gastritis/few gastric polyps  . LEFT HEART CATH AND CORONARY ANGIOGRAPHY N/A 01/20/2017   Procedure: Left Heart Cath and Coronary Angiography;  Surgeon: Jettie Booze, MD;  Location: [facility];  Service: Cardiovascular;  Laterality: N/A;  . TOTAL KNEE ARTHROPLASTY  11/10/2011   Procedure: TOTAL KNEE ARTHROPLASTY;  Surgeon: Mauri Pole, MD;  Location: [facility];  Service: Orthopedics;  Laterality: Right;  . TOTAL KNEE ARTHROPLASTY Left 05/24/2018   Procedure: LEFT TOTAL KNEE ARTHROPLASTY;  Surgeon: Melrose Nakayama, MD;  Location: [facility];  Service: Orthopedics;  Laterality: Left;    Current Outpatient Medications  Medication Sig Dispense Refill  . acetaminophen (TYLENOL) 325 MG tablet Take 650 mg by mouth every 6 (six) hours as needed.    Marland Kitchen albuterol (VENTOLIN HFA) 108 (90 Base) MCG/ACT inhaler Inhale 1-2 puffs into the lungs every  6 (six) hours as needed for wheezing or shortness of breath. 1 Inhaler 0  . amLODipine (NORVASC) 10 MG tablet Take 10 mg by mouth daily.    Marland Kitchen aspirin EC 325 MG EC tablet Take 1 tablet (325 mg total) by mouth 2 (two) times daily after a meal. (Patient taking differently: Take 81 mg by mouth daily. ) 30 tablet 0  . bisoprolol (ZEBETA) 5 MG tablet Take 2.5 mg by mouth daily.    . budesonide-formoterol (SYMBICORT) 80-4.5 MCG/ACT inhaler Inhale 2 puffs into the lungs daily as needed (shortness of breath).     .  furosemide (LASIX) 40 MG tablet Take 40 mg by mouth daily.    . insulin degludec (TRESIBA FLEXTOUCH) 100 UNIT/ML SOPN FlexTouch Pen Inject 0.5 mLs (50 Units total) into the skin daily at 10 pm. (Patient taking differently: Inject 60 Units into the skin daily at 10 pm. )    . isosorbide mononitrate (IMDUR) 30 MG 24 hr tablet Take 1 tablet (30 mg total) by mouth daily. 30 tablet 1  . levocetirizine (XYZAL) 5 MG tablet Take 5 mg by mouth every evening.     Marland Kitchen levothyroxine (SYNTHROID, LEVOTHROID) 200 MCG tablet Take 300 mcg by mouth daily before breakfast.     . losartan-hydrochlorothiazide (HYZAAR) 100-25 MG tablet Take 1 tablet by mouth daily.    . nitroGLYCERIN (NITROSTAT) 0.4 MG SL tablet Place 0.4 mg under the tongue every 5 (five) minutes as needed for chest pain.   11  . potassium chloride SA (K-DUR,KLOR-CON) 20 MEQ tablet Take 20 mEq by mouth daily.    . simvastatin (ZOCOR) 5 MG tablet Take 5 mg by mouth daily.    . traZODone (DESYREL) 50 MG tablet Take 50 mg by mouth as needed.     . fluticasone (FLONASE) 50 MCG/ACT nasal spray Place 2 sprays into both nostrils daily for 14 days. (Patient taking differently: Place 2 sprays into both nostrils as needed. ) 1 g 0   No current facility-administered medications for this visit.   Allergies:  Patient has no known allergies.   ROS:   Chronic bilateral knee pain.  No recent falls.  Physical Exam: VS:  BP (!) 144/78   Pulse 79   Temp 98.9 F (37.2 C)   Ht 6' (1.829 m)   Wt (!) 302 lb (137 kg)   SpO2 90%   BMI 40.96 kg/m , BMI Body mass index is 40.96 kg/m.  Wt Readings from Last 3 Encounters:  11/15/19 (!) 302 lb (137 kg)  01/10/19 300 lb (136.1 kg)  01/03/19 299 lb 4 oz (135.7 kg)    General:  Morbidly obese male, appears comfortable at rest. HEENT: Conjunctiva and lids normal, wearing a mask. Neck: Supple, no elevated JVP or carotid bruits, no thyromegaly. Lungs: Coarse breath sounds without wheezing, nonlabored breathing at rest.  Cardiac: Regular rate and rhythm, no S3, soft systolic murmur, no pericardial rub. Abdomen: Protuberant, bowel sounds present. Extremities: Venous stasis, distal cyanotic changes, distal pulses 1+.  ECG:  An ECG dated 01/10/2019 was personally reviewed today and demonstrated:  Sinus bradycardia with prolonged PR interval and left bundle branch block.  Recent Labwork: 01/10/2019: B Natriuretic Peptide 315.0 09/26/2019: ALT 13; AST 15; BUN 27; Creatinine, Ser 2.26; Hemoglobin 15.1; Platelets 137; Potassium 4.0; Sodium 137     Component Value Date/Time   CHOL 116 01/20/2017 1130   TRIG 174 (H) 01/20/2017 1130   HDL 31 (L) 01/20/2017 1130   CHOLHDL 3.7 01/20/2017 1130  VLDL 35 01/20/2017 1130   LDLCALC 50 01/20/2017 1130    Other Studies Reviewed Today:  Echocardiogram 12/28/2017: Study Conclusions   - Left ventricle: The cavity size was normal. Wall thickness was  increased in a pattern of moderate LVH. Images are limited,  therefore Definity contrast was used. Systolic function was  normal. The estimated ejection fraction was in the range of 55%  to 60%. Wall motion was normal; there were no regional wall  motion abnormalities. Indeterminate diastolic function, possibly  grade 2.  - Aortic valve: Mildly calcified annulus. Trileaflet; mildly  calcified leaflets. There was trivial regurgitation.  - Aortic root: The aortic root was mildly ectatic.  - Mitral valve: Mildly calcified annulus. There was trivial  regurgitation.  - Right atrium: Central venous pressure (est): 3 mm Hg.  - Tricuspid valve: There was trivial regurgitation.  - Pulmonary arteries: Systolic pressure could not be accurately  estimated.  - Pericardium, extracardiac: There was no pericardial effusion.   Assessment and Plan:  1.  CAD with history of DES to the LAD in June 2018, known occlusion of the obtuse marginal and moderate ramus intermedius stenosis.  He does not describe any active angina  on medical therapy.  Continue aspirin, Norvasc, Zebeta, Hyzaar and Zocor.  With prior history of ischemic cardiomyopathy and his described shortness of breath, we will also obtain a follow-up echocardiogram.  LVEF was normal as of 2019.  2.  Shortness of breath and hypoxic respiratory failure.  We will refer him to [facility] pulmonary for further evaluation.  Also has history of OSA and can be assessed for optimal treatment as well.  3.  Mixed hyperlipidemia, he continues on Zocor and following with PCP.  Last LDL was 50.  Medication Adjustments/Labs and Tests Ordered: Current medicines are reviewed at length with the patient today.  Concerns regarding medicines are outlined above.   Tests Ordered: Orders Placed This Encounter  Procedures  . Ambulatory referral to Pulmonology  . ECHOCARDIOGRAM COMPLETE    Medication Changes: No orders of the defined types were placed in this encounter.   Disposition:  Follow up 6 months in the [place] office.  Signed, Satira Sark, MD, [facility] 11/15/2019 2:05 PM    [place] at [facility] [address], [place], [place] [address] Phone: [phone]; Fax: [phone]

## 2019-11-17 ENCOUNTER — Other Ambulatory Visit: Payer: Self-pay

## 2019-11-17 ENCOUNTER — Ambulatory Visit (HOSPITAL_COMMUNITY)
Admission: RE | Admit: 2019-11-17 | Discharge: 2019-11-17 | Disposition: A | Discharge status: Home / Self Care | Payer: Medicare Other | Source: Ambulatory Visit | Attending: Cardiology | Admitting: Cardiology

## 2019-11-17 DIAGNOSIS — I25119 Atherosclerotic heart disease of native coronary artery with unspecified angina pectoris: Secondary | ICD-10-CM | POA: Insufficient documentation

## 2019-11-17 NOTE — Progress Notes (Signed)
*  PRELIMINARY RESULTS* Echocardiogram 2D Echocardiogram has been performed.  Leavy Cella 11/17/2019, 4:00 PM

## 2019-12-12 ENCOUNTER — Institutional Professional Consult (permissible substitution): Payer: Medicare Other | Admitting: Pulmonary Disease

## 2020-01-11 ENCOUNTER — Institutional Professional Consult (permissible substitution): Payer: Medicare Other | Admitting: Internal Medicine

## 2020-01-28 ENCOUNTER — Encounter (HOSPITAL_COMMUNITY): Payer: Self-pay | Admitting: *Deleted

## 2020-01-28 ENCOUNTER — Emergency Department (HOSPITAL_COMMUNITY): Payer: Medicare Other

## 2020-01-28 ENCOUNTER — Other Ambulatory Visit: Payer: Self-pay

## 2020-01-28 ENCOUNTER — Inpatient Hospital Stay (HOSPITAL_COMMUNITY)
Admission: EM | Admit: 2020-01-28 | Discharge: 2020-02-01 | DRG: 389 | Disposition: A | Discharge status: Home / Self Care | Payer: Medicare Other | Attending: Family Medicine | Admitting: Family Medicine

## 2020-01-28 DIAGNOSIS — J449 Chronic obstructive pulmonary disease, unspecified: Secondary | ICD-10-CM | POA: Diagnosis present

## 2020-01-28 DIAGNOSIS — J9610 Chronic respiratory failure, unspecified whether with hypoxia or hypercapnia: Secondary | ICD-10-CM | POA: Diagnosis present

## 2020-01-28 DIAGNOSIS — I130 Hypertensive heart and chronic kidney disease with heart failure and stage 1 through stage 4 chronic kidney disease, or unspecified chronic kidney disease: Secondary | ICD-10-CM | POA: Diagnosis present

## 2020-01-28 DIAGNOSIS — Z794 Long term (current) use of insulin: Secondary | ICD-10-CM

## 2020-01-28 DIAGNOSIS — E669 Obesity, unspecified: Secondary | ICD-10-CM | POA: Diagnosis present

## 2020-01-28 DIAGNOSIS — K565 Intestinal adhesions [bands], unspecified as to partial versus complete obstruction: Principal | ICD-10-CM | POA: Diagnosis present

## 2020-01-28 DIAGNOSIS — Z6839 Body mass index (BMI) 39.0-39.9, adult: Secondary | ICD-10-CM

## 2020-01-28 DIAGNOSIS — I252 Old myocardial infarction: Secondary | ICD-10-CM

## 2020-01-28 DIAGNOSIS — Z79899 Other long term (current) drug therapy: Secondary | ICD-10-CM

## 2020-01-28 DIAGNOSIS — Z7982 Long term (current) use of aspirin: Secondary | ICD-10-CM

## 2020-01-28 DIAGNOSIS — Z96653 Presence of artificial knee joint, bilateral: Secondary | ICD-10-CM | POA: Diagnosis present

## 2020-01-28 DIAGNOSIS — I5042 Chronic combined systolic (congestive) and diastolic (congestive) heart failure: Secondary | ICD-10-CM | POA: Diagnosis present

## 2020-01-28 DIAGNOSIS — E039 Hypothyroidism, unspecified: Secondary | ICD-10-CM | POA: Diagnosis present

## 2020-01-28 DIAGNOSIS — Z20822 Contact with and (suspected) exposure to covid-19: Secondary | ICD-10-CM | POA: Diagnosis present

## 2020-01-28 DIAGNOSIS — Z923 Personal history of irradiation: Secondary | ICD-10-CM

## 2020-01-28 DIAGNOSIS — N179 Acute kidney failure, unspecified: Secondary | ICD-10-CM | POA: Diagnosis present

## 2020-01-28 DIAGNOSIS — N184 Chronic kidney disease, stage 4 (severe): Secondary | ICD-10-CM | POA: Diagnosis present

## 2020-01-28 DIAGNOSIS — E1122 Type 2 diabetes mellitus with diabetic chronic kidney disease: Secondary | ICD-10-CM | POA: Diagnosis present

## 2020-01-28 DIAGNOSIS — Z7951 Long term (current) use of inhaled steroids: Secondary | ICD-10-CM

## 2020-01-28 DIAGNOSIS — Z0189 Encounter for other specified special examinations: Secondary | ICD-10-CM

## 2020-01-28 DIAGNOSIS — Z9221 Personal history of antineoplastic chemotherapy: Secondary | ICD-10-CM

## 2020-01-28 DIAGNOSIS — I2510 Atherosclerotic heart disease of native coronary artery without angina pectoris: Secondary | ICD-10-CM | POA: Diagnosis present

## 2020-01-28 DIAGNOSIS — K56609 Unspecified intestinal obstruction, unspecified as to partial versus complete obstruction: Secondary | ICD-10-CM | POA: Diagnosis not present

## 2020-01-28 DIAGNOSIS — Z955 Presence of coronary angioplasty implant and graft: Secondary | ICD-10-CM

## 2020-01-28 DIAGNOSIS — Z8572 Personal history of non-Hodgkin lymphomas: Secondary | ICD-10-CM

## 2020-01-28 DIAGNOSIS — F17210 Nicotine dependence, cigarettes, uncomplicated: Secondary | ICD-10-CM | POA: Diagnosis present

## 2020-01-28 LAB — LIPASE, BLOOD: Lipase: 23 U/L (ref 11–51)

## 2020-01-28 LAB — CBC WITH DIFFERENTIAL/PLATELET
Abs Immature Granulocytes: 0.02 10*3/uL (ref 0.00–0.07)
Basophils Absolute: 0.1 10*3/uL (ref 0.0–0.1)
Basophils Relative: 1 %
Eosinophils Absolute: 0.1 10*3/uL (ref 0.0–0.5)
Eosinophils Relative: 1 %
HCT: 54.5 % — ABNORMAL HIGH (ref 39.0–52.0)
Hemoglobin: 17.2 g/dL — ABNORMAL HIGH (ref 13.0–17.0)
Immature Granulocytes: 0 %
Lymphocytes Relative: 9 %
Lymphs Abs: 0.9 10*3/uL (ref 0.7–4.0)
MCH: 28.2 pg (ref 26.0–34.0)
MCHC: 31.6 g/dL (ref 30.0–36.0)
MCV: 89.3 fL (ref 80.0–100.0)
Monocytes Absolute: 0.5 10*3/uL (ref 0.1–1.0)
Monocytes Relative: 5 %
Neutro Abs: 8.1 10*3/uL — ABNORMAL HIGH (ref 1.7–7.7)
Neutrophils Relative %: 84 %
Platelets: 141 10*3/uL — ABNORMAL LOW (ref 150–400)
RBC: 6.1 MIL/uL — ABNORMAL HIGH (ref 4.22–5.81)
RDW: 17.3 % — ABNORMAL HIGH (ref 11.5–15.5)
WBC: 9.6 10*3/uL (ref 4.0–10.5)
nRBC: 0 % (ref 0.0–0.2)

## 2020-01-28 LAB — COMPREHENSIVE METABOLIC PANEL
ALT: 16 U/L (ref 0–44)
AST: 20 U/L (ref 15–41)
Albumin: 4.6 g/dL (ref 3.5–5.0)
Alkaline Phosphatase: 80 U/L (ref 38–126)
Anion gap: 14 (ref 5–15)
BUN: 34 mg/dL — ABNORMAL HIGH (ref 8–23)
CO2: 25 mmol/L (ref 22–32)
Calcium: 9.1 mg/dL (ref 8.9–10.3)
Chloride: 101 mmol/L (ref 98–111)
Creatinine, Ser: 2.63 mg/dL — ABNORMAL HIGH (ref 0.61–1.24)
GFR calc Af Amer: 27 mL/min — ABNORMAL LOW (ref >60)
GFR calc non Af Amer: 23 mL/min — ABNORMAL LOW (ref >60)
Glucose, Bld: 116 mg/dL — ABNORMAL HIGH (ref 70–99)
Potassium: 4.4 mmol/L (ref 3.5–5.1)
Sodium: 140 mmol/L (ref 135–145)
Total Bilirubin: 1.5 mg/dL — ABNORMAL HIGH (ref 0.3–1.2)
Total Protein: 8.4 g/dL — ABNORMAL HIGH (ref 6.5–8.1)

## 2020-01-28 LAB — SARS CORONAVIRUS 2 BY RT PCR (HOSPITAL ORDER, PERFORMED IN ~~LOC~~ HOSPITAL LAB): SARS Coronavirus 2: NEGATIVE

## 2020-01-28 LAB — GLUCOSE, CAPILLARY: Glucose-Capillary: 92 mg/dL (ref 70–99)

## 2020-01-28 LAB — TSH: TSH: 1.738 u[IU]/mL (ref 0.350–4.500)

## 2020-01-28 MED ORDER — ONDANSETRON HCL 4 MG PO TABS: 4.0000 mg | ORAL_TABLET | Freq: Four times a day (QID) | ORAL | Status: DC | PRN

## 2020-01-28 MED ORDER — ACETAMINOPHEN 650 MG RE SUPP: 650.0000 mg | Freq: Four times a day (QID) | RECTAL | Status: DC | PRN

## 2020-01-28 MED ORDER — INSULIN ASPART 100 UNIT/ML ~~LOC~~ SOLN: 0.0000 [IU] | Freq: Four times a day (QID) | SUBCUTANEOUS | Status: DC

## 2020-01-28 MED ORDER — HYDROMORPHONE HCL 1 MG/ML IJ SOLN
1.0000 mg | Freq: Once | INTRAMUSCULAR | Status: AC
  Administered 2020-01-28: 1 mg via INTRAVENOUS
  Filled 2020-01-28: qty 1

## 2020-01-28 MED ORDER — SODIUM CHLORIDE 0.9 % IV BOLUS
500.0000 mL | Freq: Once | INTRAVENOUS | Status: AC
  Administered 2020-01-28: 500 mL via INTRAVENOUS

## 2020-01-28 MED ORDER — ALBUTEROL SULFATE (2.5 MG/3ML) 0.083% IN NEBU
2.5000 mg | INHALATION_SOLUTION | Freq: Four times a day (QID) | RESPIRATORY_TRACT | Status: DC | PRN
  Administered 2020-01-29: 2.5 mg via RESPIRATORY_TRACT
  Filled 2020-01-28 (×2): qty 3

## 2020-01-28 MED ORDER — ACETAMINOPHEN 325 MG PO TABS: 650.0000 mg | ORAL_TABLET | Freq: Four times a day (QID) | ORAL | Status: DC | PRN

## 2020-01-28 MED ORDER — HYDROMORPHONE HCL 1 MG/ML IJ SOLN
0.5000 mg | INTRAMUSCULAR | Status: DC | PRN
  Administered 2020-01-29 (×3): 0.5 mg via INTRAVENOUS
  Filled 2020-01-28 (×3): qty 0.5

## 2020-01-28 MED ORDER — HYDRALAZINE HCL 20 MG/ML IJ SOLN: 10.0000 mg | INTRAMUSCULAR | Status: DC | PRN

## 2020-01-28 MED ORDER — FAMOTIDINE IN NACL 20-0.9 MG/50ML-% IV SOLN
20.0000 mg | Freq: Every day | INTRAVENOUS | Status: DC
  Administered 2020-01-28 – 2020-01-31 (×4): 20 mg via INTRAVENOUS
  Filled 2020-01-28 (×4): qty 50

## 2020-01-28 MED ORDER — ONDANSETRON HCL 4 MG/2ML IJ SOLN
4.0000 mg | Freq: Four times a day (QID) | INTRAMUSCULAR | Status: DC | PRN
  Administered 2020-01-28 – 2020-01-29 (×2): 4 mg via INTRAVENOUS
  Filled 2020-01-28 (×2): qty 2

## 2020-01-28 MED ORDER — ONDANSETRON HCL 4 MG/2ML IJ SOLN
4.0000 mg | Freq: Once | INTRAMUSCULAR | Status: AC
  Administered 2020-01-28: 4 mg via INTRAVENOUS
  Filled 2020-01-28: qty 2

## 2020-01-28 MED ORDER — SODIUM CHLORIDE 0.9 % IV SOLN: INTRAVENOUS | Status: DC

## 2020-01-28 MED ORDER — ONDANSETRON HCL 4 MG/2ML IJ SOLN: 4.0000 mg | Freq: Four times a day (QID) | INTRAMUSCULAR | Status: DC | PRN

## 2020-01-28 MED ORDER — MOMETASONE FURO-FORMOTEROL FUM 200-5 MCG/ACT IN AERO
2.0000 | INHALATION_SPRAY | Freq: Two times a day (BID) | RESPIRATORY_TRACT | Status: DC
  Administered 2020-01-29 – 2020-02-01 (×7): 2 via RESPIRATORY_TRACT
  Filled 2020-01-28: qty 8.8

## 2020-01-28 NOTE — ED Provider Notes (Signed)
[name] Provider Note   CSN: [id number] Arrival date & time: 01/28/20  1302     History Chief Complaint  Patient presents with  . Abdominal Pain    [name] [name] is a 74 y.o. male.  Patient with onset of generalized abdominal pain crampy in nature for this morning.  Associated with multiple episodes of vomiting no diarrhea no blood in the vomit.  Patient's had his gallbladder removed before.  Normally on oxygen at home 2 L.  Past medical history significant for sleep apnea hypothyroidism coronary artery disease hyperlipidemia obesity non-STEMI in 2018 essential hypertension and type 2 diabetes.  Chronic kidney disease stage III and a history of non-Hodgkin's lymphoma.        Past Medical History:  Diagnosis Date  . CAD (coronary artery disease)    DES to LAD June 2018  . CKD (chronic kidney disease) stage 3, GFR 30-59 ml/min   . Essential hypertension   . History of pneumonia   . Hyperlipidemia   . Hypothyroidism   . LBBB (left bundle branch block)   . Morbid obesity ([place])   . Non Hodgkin's lymphoma ([place])    Status post XRT and chemotherapy  . NSTEMI (non-ST elevated myocardial infarction) [facility])    June 2018  . Peripheral neuropathy   . Sleep apnea   . Type 2 diabetes mellitus [facility])     Patient Active Problem List   Diagnosis Date Noted  . Primary osteoarthritis of left knee 05/24/2018  . Primary localized osteoarthritis of left knee 05/19/2018  . Acute on chronic systolic and diastolic heart failure, NYHA class 1 ([place]) 04/01/2017  . CAD (coronary artery disease) 04/01/2017  . SOB (shortness of breath) 03/21/2017  . HTN (hypertension) 03/21/2017  . NSTEMI (non-ST elevated myocardial infarction) ([place]) 01/19/2017  . NSTEMI, initial episode of care ([place]) 01/19/2017  . Sepsis ([place]) 07/24/2016  . Elevated troponin I level 07/24/2016  . Fever 07/24/2016  . Lactic acidosis 07/24/2016  . Adjustment insomnia 05/18/2016  . Erectile dysfunction  12/19/2015  . Sleep apnea 09/30/2015  . Osteoarthritis 09/30/2015  . Hypothyroidism 09/30/2015  . Hypogonadism in male 09/30/2015  . Diabetic neuropathy ([place]) 09/30/2015  . Chronic pain of left knee 09/30/2015  . Benign essential hypertension 09/30/2015  . Acute on chronic kidney failure ([place]) 05/14/2015  . Diarrhea 05/14/2015  . Dehydration 05/14/2015  . Hypokalemia 05/14/2015  . 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 Pelvic mass in male   . Varices, esophageal ([place])   . Colonic mass   . Abnormal CT scan, pelvis   . Bladder mass   . Elevated liver enzymes   . Elevated LFTs   . Abdominal pain 07/23/2014  . Chronic kidney disease Stage IV 07/23/2014  . Morbid obesity ([place]) 07/23/2014  . Type 2 diabetes mellitus with stage 4 chronic kidney disease ([place]) 07/23/2014  . Hypertension 07/23/2014  . S/P total knee replacement, 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    Past Surgical History:  Procedure Laterality Date  . CHOLECYSTECTOMY  1992  . COLONOSCOPY N/A 09/03/2014   SLF:six colon polyps removed/small internal hemorrhoids  . CORONARY STENT INTERVENTION N/A 01/21/2017   Procedure: Coronary Stent Intervention;  Surgeon: Nelva Bush, MD;  Location: [place] [facility];  Service: Cardiovascular;  Laterality: N/A;  . ESOPHAGOGASTRODUODENOSCOPY N/A 09/03/2014   SLF: mild gastritis/few gastric polyps  . LEFT HEART CATH AND CORONARY ANGIOGRAPHY N/A 01/20/2017   Procedure: Left Heart Cath and Coronary Angiography;  Surgeon: Jettie Booze, MD;  Location: [facility] [employer];  Service: Cardiovascular;  Laterality: N/A;  . TOTAL KNEE ARTHROPLASTY  11/10/2011   Procedure: TOTAL KNEE ARTHROPLASTY;  Surgeon: Mauri Pole, MD;  Location: [facility];  Service: Orthopedics;  Laterality: Right;  . TOTAL KNEE ARTHROPLASTY Left 05/24/2018   Procedure: LEFT TOTAL KNEE ARTHROPLASTY;  Surgeon: Melrose Nakayama, MD;  Location: [place];  Service: Orthopedics;  Laterality: Left;       Family History  Problem Relation  Age of Onset  . Cancer Mother        breast and lung  . Cancer Father        bladder  . Cancer Maternal Uncle        prostate  . Cancer Paternal Uncle        esophagus  . Colon cancer Neg Hx     Social History   Tobacco Use  . Smoking status: Current Some Day Smoker    Packs/day: 0.25    Years: 30.00    Pack years: 7.50    Types: Cigarettes    Last attempt to quit: 08/04/2007    Years since quitting: 12.4  . Smokeless tobacco: Never Used  Vaping Use  . Vaping Use: Never used  Substance Use Topics  . Alcohol use: No    Alcohol/week: 0.0 standard drinks  . Drug use: No    Home Medications Prior to Admission medications   Medication Sig Start Date End Date Taking? Authorizing Provider  albuterol (VENTOLIN HFA) 108 (90 Base) MCG/ACT inhaler Inhale 1-2 puffs into the lungs every 6 (six) hours as needed for wheezing or shortness of breath. 01/10/19  Yes Long, Wonda Olds, MD  amLODipine (NORVASC) 10 MG tablet Take 10 mg by mouth daily.   Yes [provider]  aspirin EC 81 MG tablet Take 81 mg by mouth daily. Swallow whole.   Yes [provider]  bisoprolol (ZEBETA) 5 MG tablet Take 2.5 mg by mouth daily.   Yes [provider]  furosemide (LASIX) 40 MG tablet Take 40 mg by mouth daily.   Yes [provider]  insulin degludec (TRESIBA FLEXTOUCH) 100 UNIT/ML SOPN FlexTouch Pen Inject 0.5 mLs (50 Units total) into the skin daily at 10 pm. Patient taking differently: Inject 60 Units into the skin daily at 10 pm.  07/28/16  Yes Kathie Dike, MD  isosorbide mononitrate (IMDUR) 30 MG 24 hr tablet Take 1 tablet (30 mg total) by mouth daily. 04/02/17  Yes Orvan Falconer, MD  levocetirizine (XYZAL) 5 MG tablet Take 5 mg by mouth every evening.  08/27/17  Yes [provider]  levothyroxine (SYNTHROID) 100 MCG tablet Take 100 mcg by mouth daily. (takes with 280mcg for a total of 318mcg) 01/08/20  Yes [provider]  levothyroxine (SYNTHROID,  LEVOTHROID) 200 MCG tablet Take 300 mcg by mouth daily before breakfast. (takes with 150mcg for a total of 356mcg)   Yes [provider]  losartan (COZAAR) 100 MG tablet Take 100 mg by mouth daily. 01/08/20  Yes [provider]  nitroGLYCERIN (NITROSTAT) 0.4 MG SL tablet Place 0.4 mg under the tongue every 5 (five) minutes as needed for chest pain.  12/27/17  Yes [provider]  simvastatin (ZOCOR) 5 MG tablet Take 5 mg by mouth daily. 09/14/16  Yes [provider]  SYMBICORT 160-4.5 MCG/ACT inhaler Inhale 2 puffs into the lungs daily. 12/22/19  Yes [provider]  fluticasone (FLONASE) 50 MCG/ACT nasal spray Place 2 sprays into both nostrils daily  for 14 days. Patient taking differently: Place 2 sprays into both nostrils as needed.  01/10/19 09/27/19  Long, Wonda Olds, MD    Allergies    Patient has no known allergies.  Review of Systems   Review of Systems  Constitutional: Negative for chills and fever.  HENT: Negative for congestion, rhinorrhea and sore throat.   Eyes: Negative for visual disturbance.  Respiratory: Negative for cough and shortness of breath.   Cardiovascular: Negative for chest pain and leg swelling.  Gastrointestinal: Positive for abdominal pain, nausea and vomiting. Negative for diarrhea.  Genitourinary: Negative for dysuria.  Musculoskeletal: Negative for back pain and neck pain.  Skin: Negative for rash.  Neurological: Negative for dizziness, light-headedness and headaches.  Hematological: Does not bruise/bleed easily.  Psychiatric/Behavioral: Negative for confusion.    Physical Exam Updated Vital Signs BP 136/81   Pulse 61   Temp 98.2 F (36.8 C) (Oral)   Resp 18   Ht 1.854 m (6\' 1" )   Wt 136.1 kg   SpO2 97%   BMI 39.58 kg/m   Physical Exam Vitals and nursing note reviewed.  Constitutional:      Appearance: Normal appearance. He is well-developed.  HENT:     Head: Normocephalic and atraumatic.  Eyes:      Extraocular Movements: Extraocular movements intact.     Conjunctiva/sclera: Conjunctivae normal.     Pupils: Pupils are equal, round, and reactive to light.  Cardiovascular:     Rate and Rhythm: Normal rate and regular rhythm.     Heart sounds: No murmur heard.   Pulmonary:     Effort: Pulmonary effort is normal. No respiratory distress.     Breath sounds: Normal breath sounds.  Abdominal:     General: There is distension.     Palpations: Abdomen is soft.     Tenderness: There is abdominal tenderness.  Musculoskeletal:        General: Normal range of motion.     Cervical back: Neck supple.  Skin:    General: Skin is warm and dry.     Capillary Refill: Capillary refill takes less than 2 seconds.  Neurological:     General: No focal deficit present.     Mental Status: He is alert and oriented to person, place, and time.     Cranial Nerves: No cranial nerve deficit.     Sensory: No sensory deficit.     Motor: No weakness.     ED Results / Procedures / Treatments   Labs (all labs ordered are listed, but only abnormal results are displayed) Labs Reviewed  CBC WITH DIFFERENTIAL/PLATELET - Abnormal; Notable for the following components:      Result Value   RBC 6.10 (*)    Hemoglobin 17.2 (*)    HCT 54.5 (*)    RDW 17.3 (*)    Platelets 141 (*)    Neutro Abs 8.1 (*)    All other components within normal limits  COMPREHENSIVE METABOLIC PANEL - Abnormal; Notable for the following components:   Glucose, Bld 116 (*)    BUN 34 (*)    Creatinine, Ser 2.63 (*)    Total Protein 8.4 (*)    Total Bilirubin 1.5 (*)    GFR calc non Af Amer 23 (*)    GFR calc Af Amer 27 (*)    All other components within normal limits  SARS CORONAVIRUS 2 BY RT PCR ([facility])  LIPASE, BLOOD  EKG None  Radiology CT ABDOMEN PELVIS WO CONTRAST  Result Date: 01/28/2020 CLINICAL DATA:  Generalized abdominal pain with nausea and vomiting since yesterday  question bowel obstruction; history coronary artery disease post MI, chronic kidney disease, hypertension, non-Hodgkin's lymphoma, type II diabetes mellitus, prior cholecystectomy EXAM: CT ABDOMEN AND PELVIS WITHOUT CONTRAST TECHNIQUE: Multidetector CT imaging of the abdomen and pelvis was performed following the standard protocol without IV contrast. Sagittal and coronal MPR images reconstructed from axial data set. IV contrast could not be administered due to renal dysfunction. No oral contrast administered. COMPARISON:  08/01/2019 FINDINGS: Lower chest: Bibasilar atelectasis.  Small LEFT pleural effusion. Hepatobiliary: Post cholecystectomy. Pneumobilia again identified present since at least PET-CT from 2017. No focal hepatic mass Pancreas: Normal appearance Spleen: Appears prominent in size 6.4 x 18.2 x 16.4 cm (volume = 1000 cm^3). No focal lesion. Adrenals/Urinary Tract: Adrenal glands, kidneys, and ureters normal appearance. Mild LEFT lateral bladder wall thickening, appears chronic with mild associated tenting. Stomach/Bowel: Normal appendix. Distended stomach. Dilated proximal small bowel loops with decompressed distal small bowel loops consistent with small bowel obstruction. Transition zone appears to be in the mid small bowel questionably in the LEFT mid abdomen coronal image 26. No evidence of perforation or abscess. No significant wall thickening. Scattered mild mesenteric edema. Colon decompressed. Vascular/Lymphatic: Atherosclerotic calcifications aorta and iliac arteries without aneurysm. No adenopathy. Reproductive: Unremarkable prostate gland and seminal vesicles Other: Calcific density inferior to the xiphoid process with adjacent fluid unchanged from prior studies. No free air or free fluid. No hernia. Musculoskeletal: Osseous structures unremarkable. IMPRESSION: Mid small bowel obstruction with question transition zone in the LEFT mid abdomen. Splenomegaly. Bibasilar atelectasis and small LEFT  pleural effusion. Chronic mild LEFT lateral bladder wall thickening with associated tenting, stable. Again identified pneumobilia suspect prior sphincterotomy. Aortic Atherosclerosis (ICD10-I70.0). Electronically Signed   By: Lavonia Dana M.D.   On: 01/28/2020 17:22    Procedures Procedures (including critical care time)  Medications Ordered in ED Medications  0.9 %  sodium chloride infusion ( Intravenous New Bag/Given 01/28/20 1542)  ondansetron (ZOFRAN) injection 4 mg (4 mg Intravenous Given 01/28/20 1542)  HYDROmorphone (DILAUDID) injection 1 mg (1 mg Intravenous Given 01/28/20 1543)  sodium chloride 0.9 % bolus 500 mL (500 mLs Intravenous New Bag/Given 01/28/20 1541)    ED Course  I have reviewed the triage vital signs and the nursing notes.  Pertinent labs & imaging results that were available during my care of the patient were reviewed by me and considered in my medical decision making (see chart for details).    MDM Rules/Calculators/A&P                          CT scan shows evidence of small bowel obstruction with a transition point.  Discussed with Dr. Arnoldo Morale recommends admission and he will consult to hospitalist admission.  Does not recommend NG tube at this time because patient now feels better after receiving pain medicine and Zofran.  No further vomiting.  Patient has significant medical problems as listed in the HPI.  Patient's labs without significant abnormalities.  No leukocytosis.  Receiving IV fluids.  Covid test has been ordered patient BUN and creatinine elevated but somewhat baseline.  Bilirubin up a little bit at 1.5.  No significant electrolyte abnormalities.  Patient's blood sugar is 116. Final Clinical Impression(s) / ED Diagnoses Final diagnoses:  Small bowel obstruction ([place])    Rx / DC Orders ED Discharge Orders  None       Fredia Sorrow, MD 01/28/20 [phone]

## 2020-01-28 NOTE — ED Triage Notes (Signed)
Pt brought in by [facility] for c/o abdominal pain and "bloat"; cbg 150; pt states he has been vomiting; all these sx started last night; last BM today

## 2020-01-28 NOTE — ED Notes (Signed)
Swab given to keep mouth moist, no needs expressed

## 2020-01-28 NOTE — H&P (Signed)
History and Physical    [name] [name] MWN:[id number] DOB: Sep 12, 1945 DOA: 01/28/2020  PCP: [name]   Patient coming from: Home  I have personally briefly reviewed patient's old medical records in [place]  Chief Complaint: Abdominal Pain, vomiting  HPI: [name] [name] is a 74 y.o. male with medical history significant for coronary artery disease, non-Hodgkin's lymphoma, diabetes mellitus. Patient presented to the ED with complaints of abdominal pain, and vomiting that started last night.  Patient's last bowel movement was today-normal in consistency and amount.  Vomitus was nonbloody and nonbilious. No pain with urination.  No chest pain, no difficulty breathing.  ED Course: Stable vitals.  Creatinine 2.6.  Normal lipase 23.  WBC 9.2.  Abdominal CT scan-mid small bowel obstruction with questionable transition zone in the left mid abdomen. EDP talked with general surgeon on-call Dr. Arnoldo Morale recommended admission under hospitalist service.  Review of Systems: As per HPI all other systems reviewed and negative.  Past Medical History:  Diagnosis Date  . CAD (coronary artery disease)    DES to LAD June 2018  . CKD (chronic kidney disease) stage 3, GFR 30-59 ml/min   . Essential hypertension   . History of pneumonia   . Hyperlipidemia   . Hypothyroidism   . LBBB (left bundle branch block)   . Morbid obesity ([place])   . Non Hodgkin's lymphoma ([place])    Status post XRT and chemotherapy  . NSTEMI (non-ST elevated myocardial infarction) [facility])    June 2018  . Peripheral neuropathy   . Sleep apnea   . Type 2 diabetes mellitus ([place])     Past Surgical History:  Procedure Laterality Date  . CHOLECYSTECTOMY  1992  . COLONOSCOPY N/A 09/03/2014   SLF:six colon polyps removed/small internal hemorrhoids  . CORONARY STENT INTERVENTION N/A 01/21/2017   Procedure: Coronary Stent Intervention;  Surgeon: Nelva Bush, MD;  Location: [facility];   Service: Cardiovascular;  Laterality: N/A;  . ESOPHAGOGASTRODUODENOSCOPY N/A 09/03/2014   SLF: mild gastritis/few gastric polyps  . LEFT HEART CATH AND CORONARY ANGIOGRAPHY N/A 01/20/2017   Procedure: Left Heart Cath and Coronary Angiography;  Surgeon: Jettie Booze, MD;  Location: [facility];  Service: Cardiovascular;  Laterality: N/A;  . TOTAL KNEE ARTHROPLASTY  11/10/2011   Procedure: TOTAL KNEE ARTHROPLASTY;  Surgeon: Mauri Pole, MD;  Location: [facility];  Service: Orthopedics;  Laterality: Right;  . TOTAL KNEE ARTHROPLASTY Left 05/24/2018   Procedure: LEFT TOTAL KNEE ARTHROPLASTY;  Surgeon: Melrose Nakayama, MD;  Location: [facility];  Service: Orthopedics;  Laterality: Left;     reports that he has been smoking cigarettes. He has a 7.50 pack-year smoking history. He has never used smokeless tobacco. He reports that he does not drink alcohol and does not use drugs.  No Known Allergies  Family History  Problem Relation Age of Onset  . Cancer Mother        breast and lung  . Cancer Father        bladder  . Cancer Maternal Uncle        prostate  . Cancer Paternal Uncle        esophagus  . Colon cancer Neg Hx     Prior to Admission medications   Medication Sig Start Date End Date Taking? Authorizing Provider  albuterol (VENTOLIN HFA) 108 (90 Base) MCG/ACT inhaler Inhale 1-2 puffs into the lungs every 6 (six) hours as needed for wheezing or shortness of  breath. 01/10/19  Yes Long, Wonda Olds, MD  amLODipine (NORVASC) 10 MG tablet Take 10 mg by mouth daily.   Yes [provider]  aspirin EC 81 MG tablet Take 81 mg by mouth daily. Swallow whole.   Yes [provider]  bisoprolol (ZEBETA) 5 MG tablet Take 2.5 mg by mouth daily.   Yes [provider]  furosemide (LASIX) 40 MG tablet Take 40 mg by mouth daily.   Yes [provider]  insulin degludec (TRESIBA FLEXTOUCH) 100 UNIT/ML SOPN FlexTouch Pen Inject 0.5 mLs (50 Units total) into the skin daily  at 10 pm. Patient taking differently: Inject 60 Units into the skin daily at 10 pm.  07/28/16  Yes Kathie Dike, MD  isosorbide mononitrate (IMDUR) 30 MG 24 hr tablet Take 1 tablet (30 mg total) by mouth daily. 04/02/17  Yes Orvan Falconer, MD  levocetirizine (XYZAL) 5 MG tablet Take 5 mg by mouth every evening.  08/27/17  Yes [provider]  levothyroxine (SYNTHROID) 100 MCG tablet Take 100 mcg by mouth daily. (takes with 230mcg for a total of 386mcg) 01/08/20  Yes [provider]  levothyroxine (SYNTHROID, LEVOTHROID) 200 MCG tablet Take 300 mcg by mouth daily before breakfast. (takes with 140mcg for a total of 320mcg)   Yes [provider]  losartan (COZAAR) 100 MG tablet Take 100 mg by mouth daily. 01/08/20  Yes [provider]  nitroGLYCERIN (NITROSTAT) 0.4 MG SL tablet Place 0.4 mg under the tongue every 5 (five) minutes as needed for chest pain.  12/27/17  Yes [provider]  simvastatin (ZOCOR) 5 MG tablet Take 5 mg by mouth daily. 09/14/16  Yes [provider]  SYMBICORT 160-4.5 MCG/ACT inhaler Inhale 2 puffs into the lungs daily. 12/22/19  Yes [provider]  fluticasone (FLONASE) 50 MCG/ACT nasal spray Place 2 sprays into both nostrils daily for 14 days. Patient taking differently: Place 2 sprays into both nostrils as needed.  01/10/19 09/27/19  Margette Fast, MD    Physical Exam: Vitals:   01/28/20 1312 01/28/20 1530 01/28/20 1837 01/28/20 1900  BP: (!) 157/73 136/81 (!) 159/80 (!) 160/78  Pulse: (!) 57 61 (!) 53 (!) 50  Resp: 18   15  Temp: 98.2 F (36.8 C)     TempSrc: Oral     SpO2: 92% 97% 94% 93%  Weight:      Height:        Constitutional: NAD, calm, comfortable Vitals:   01/28/20 1312 01/28/20 1530 01/28/20 1837 01/28/20 1900  BP: (!) 157/73 136/81 (!) 159/80 (!) 160/78  Pulse: (!) 57 61 (!) 53 (!) 50  Resp: 18   15  Temp: 98.2 F (36.8 C)     TempSrc: Oral     SpO2: 92% 97% 94% 93%  Weight:      Height:        Eyes: PERRL, lids and conjunctivae normal ENMT: Mucous membranes are moist. Posterior pharynx clear of any exudate or lesions. Neck: normal, supple, no masses, no thyromegaly Respiratory: Normal respiratory effort. No accessory muscle use.  Cardiovascular: Regular rate and rhythm,  No extremity edema. 2+ pedal pulses. No carotid bruits.  Abdomen: no tenderness, no masses palpated. No hepatosplenomegaly. Bowel sounds positive.  Musculoskeletal: no clubbing / cyanosis. No joint deformity upper and lower extremities. Good ROM, no contractures. Normal muscle tone.  Skin: Chronic stasis changes on bilateral lower extremities, no ulcers. No induration Neurologic: No apparent cranial nerve abnormality, moving extremities spontaneously. Psychiatric:  Normal judgment and insight. Alert and oriented x 3. Normal mood.   Labs on Admission: I have personally reviewed following labs and imaging studies  CBC: Recent Labs  Lab 01/28/20 1529  WBC 9.6  NEUTROABS 8.1*  HGB 17.2*  HCT 54.5*  MCV 89.3  PLT 510*   Basic Metabolic Panel: Recent Labs  Lab 01/28/20 1529  NA 140  K 4.4  CL 101  CO2 25  GLUCOSE 116*  BUN 34*  CREATININE 2.63*  CALCIUM 9.1   Liver Function Tests: Recent Labs  Lab 01/28/20 1529  AST 20  ALT 16  ALKPHOS 80  BILITOT 1.5*  PROT 8.4*  ALBUMIN 4.6   Recent Labs  Lab 01/28/20 1529  LIPASE 23    Radiological Exams on Admission: CT ABDOMEN PELVIS WO CONTRAST  Result Date: 01/28/2020 CLINICAL DATA:  Generalized abdominal pain with nausea and vomiting since yesterday question bowel obstruction; history coronary artery disease post MI, chronic kidney disease, hypertension, non-Hodgkin's lymphoma, type II diabetes mellitus, prior cholecystectomy EXAM: CT ABDOMEN AND PELVIS WITHOUT CONTRAST TECHNIQUE: Multidetector CT imaging of the abdomen and pelvis was performed following the standard protocol without IV contrast. Sagittal and coronal MPR images reconstructed  from axial data set. IV contrast could not be administered due to renal dysfunction. No oral contrast administered. COMPARISON:  08/01/2019 FINDINGS: Lower chest: Bibasilar atelectasis.  Small LEFT pleural effusion. Hepatobiliary: Post cholecystectomy. Pneumobilia again identified present since at least PET-CT from 2017. No focal hepatic mass Pancreas: Normal appearance Spleen: Appears prominent in size 6.4 x 18.2 x 16.4 cm (volume = 1000 cm^3). No focal lesion. Adrenals/Urinary Tract: Adrenal glands, kidneys, and ureters normal appearance. Mild LEFT lateral bladder wall thickening, appears chronic with mild associated tenting. Stomach/Bowel: Normal appendix. Distended stomach. Dilated proximal small bowel loops with decompressed distal small bowel loops consistent with small bowel obstruction. Transition zone appears to be in the mid small bowel questionably in the LEFT mid abdomen coronal image 26. No evidence of perforation or abscess. No significant wall thickening. Scattered mild mesenteric edema. Colon decompressed. Vascular/Lymphatic: Atherosclerotic calcifications aorta and iliac arteries without aneurysm. No adenopathy. Reproductive: Unremarkable prostate gland and seminal vesicles Other: Calcific density inferior to the xiphoid process with adjacent fluid unchanged from prior studies. No free air or free fluid. No hernia. Musculoskeletal: Osseous structures unremarkable. IMPRESSION: Mid small bowel obstruction with question transition zone in the LEFT mid abdomen. Splenomegaly. Bibasilar atelectasis and small LEFT pleural effusion. Chronic mild LEFT lateral bladder wall thickening with associated tenting, stable. Again identified pneumobilia suspect prior sphincterotomy. Aortic Atherosclerosis (ICD10-I70.0). Electronically Signed   By: Lavonia Dana M.D.   On: 01/28/2020 17:22    EKG: None.  Assessment/Plan Active Problems:   SBO (small bowel obstruction) (HCC)   Small bowel obstruction-abdominal  pain, vomiting, last bowel movement today.  Abdominal CT shows mid small bowel obstruction with question transition zone in the left mid abdomen. - EDP talked with general surgeon, will see in consult, defer NG tube for now as patient feels better. -Dilaudid as needed -Zofran as needed -N.p.o. -BMP in the morning -500 mill bolus given, continue normal saline 75 cc/h  -IV famotidine 20 mg daily  Acute kidney injury on CKD 4-creatinine 2.63, baseline 2-2.2.  Likely prerenal in the setting of Lasix and losartan use. -  IV fluids - monitor creatinine  Diabetes mellitus-random glucose 116. - SSI- s Q6H -  Hold Tresiba while n.p.o. - HgbA1c  Hypertension-stable. -Hold on Norvasc, bisoprolol, Imdur, Lasix, losartan while n.p.o. -  As needed hydralazine 10mg  for systolic greater than 277  Hypothyroidism-  - 300mg  Synthroid held for now while n.p.o. -TSH normal at 1.738.  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  Echo 11/2019 EF 45 to 41%, grade 2 diastolic dysfunction.  Follows with Dr. Conni Elliot. -Hold home Lasix from daily, Imdur, bisoprolol  Coronary artery disease-with stent placement 2018. -Hold aspirin, simvastatin while n.p.o.  Marginal zone lymphoma-stage II.  s/p chemotherapy 2016 through 2018..  Follows with Dr. Delton Coombes.   Chronic respiratory failure  - on 2 L at baseline.   DVT prophylaxis: SCDs for now, pending surgical evaluation Code Status: Full code Family Communication: None at bedside. Disposition Plan:  ~ 1 - 2 days, pending resolution of SBO, patient able to take p.o. Consults called: General surgery Admission status: Observation, telemetry   Bethena Roys MD [facility]  01/28/2020, 9:42 PM

## 2020-01-29 ENCOUNTER — Observation Stay (HOSPITAL_COMMUNITY): Payer: Medicare Other

## 2020-01-29 DIAGNOSIS — E1122 Type 2 diabetes mellitus with diabetic chronic kidney disease: Secondary | ICD-10-CM | POA: Diagnosis present

## 2020-01-29 DIAGNOSIS — Z8572 Personal history of non-Hodgkin lymphomas: Secondary | ICD-10-CM | POA: Diagnosis not present

## 2020-01-29 DIAGNOSIS — N184 Chronic kidney disease, stage 4 (severe): Secondary | ICD-10-CM | POA: Diagnosis present

## 2020-01-29 DIAGNOSIS — Z7982 Long term (current) use of aspirin: Secondary | ICD-10-CM | POA: Diagnosis not present

## 2020-01-29 DIAGNOSIS — F17210 Nicotine dependence, cigarettes, uncomplicated: Secondary | ICD-10-CM | POA: Diagnosis present

## 2020-01-29 DIAGNOSIS — E039 Hypothyroidism, unspecified: Secondary | ICD-10-CM | POA: Diagnosis present

## 2020-01-29 DIAGNOSIS — I130 Hypertensive heart and chronic kidney disease with heart failure and stage 1 through stage 4 chronic kidney disease, or unspecified chronic kidney disease: Secondary | ICD-10-CM | POA: Diagnosis present

## 2020-01-29 DIAGNOSIS — N179 Acute kidney failure, unspecified: Secondary | ICD-10-CM

## 2020-01-29 DIAGNOSIS — Z7951 Long term (current) use of inhaled steroids: Secondary | ICD-10-CM | POA: Diagnosis not present

## 2020-01-29 DIAGNOSIS — Z79899 Other long term (current) drug therapy: Secondary | ICD-10-CM | POA: Diagnosis not present

## 2020-01-29 DIAGNOSIS — K565 Intestinal adhesions [bands], unspecified as to partial versus complete obstruction: Secondary | ICD-10-CM | POA: Diagnosis present

## 2020-01-29 DIAGNOSIS — E669 Obesity, unspecified: Secondary | ICD-10-CM | POA: Diagnosis present

## 2020-01-29 DIAGNOSIS — K56609 Unspecified intestinal obstruction, unspecified as to partial versus complete obstruction: Secondary | ICD-10-CM | POA: Diagnosis present

## 2020-01-29 DIAGNOSIS — I2510 Atherosclerotic heart disease of native coronary artery without angina pectoris: Secondary | ICD-10-CM | POA: Diagnosis present

## 2020-01-29 DIAGNOSIS — Z9221 Personal history of antineoplastic chemotherapy: Secondary | ICD-10-CM | POA: Diagnosis not present

## 2020-01-29 DIAGNOSIS — Z955 Presence of coronary angioplasty implant and graft: Secondary | ICD-10-CM | POA: Diagnosis not present

## 2020-01-29 DIAGNOSIS — Z20822 Contact with and (suspected) exposure to covid-19: Secondary | ICD-10-CM | POA: Diagnosis present

## 2020-01-29 DIAGNOSIS — Z6839 Body mass index (BMI) 39.0-39.9, adult: Secondary | ICD-10-CM | POA: Diagnosis not present

## 2020-01-29 DIAGNOSIS — Z96653 Presence of artificial knee joint, bilateral: Secondary | ICD-10-CM | POA: Diagnosis present

## 2020-01-29 DIAGNOSIS — I5042 Chronic combined systolic (congestive) and diastolic (congestive) heart failure: Secondary | ICD-10-CM | POA: Diagnosis present

## 2020-01-29 DIAGNOSIS — K219 Gastro-esophageal reflux disease without esophagitis: Secondary | ICD-10-CM | POA: Diagnosis not present

## 2020-01-29 DIAGNOSIS — J449 Chronic obstructive pulmonary disease, unspecified: Secondary | ICD-10-CM | POA: Diagnosis present

## 2020-01-29 DIAGNOSIS — J9610 Chronic respiratory failure, unspecified whether with hypoxia or hypercapnia: Secondary | ICD-10-CM | POA: Diagnosis present

## 2020-01-29 DIAGNOSIS — Z794 Long term (current) use of insulin: Secondary | ICD-10-CM | POA: Diagnosis not present

## 2020-01-29 DIAGNOSIS — Z923 Personal history of irradiation: Secondary | ICD-10-CM | POA: Diagnosis not present

## 2020-01-29 DIAGNOSIS — I252 Old myocardial infarction: Secondary | ICD-10-CM | POA: Diagnosis not present

## 2020-01-29 LAB — BASIC METABOLIC PANEL
Anion gap: 10 (ref 5–15)
BUN: 31 mg/dL — ABNORMAL HIGH (ref 8–23)
CO2: 24 mmol/L (ref 22–32)
Calcium: 8.5 mg/dL — ABNORMAL LOW (ref 8.9–10.3)
Chloride: 106 mmol/L (ref 98–111)
Creatinine, Ser: 2.36 mg/dL — ABNORMAL HIGH (ref 0.61–1.24)
GFR calc Af Amer: 31 mL/min — ABNORMAL LOW (ref >60)
GFR calc non Af Amer: 27 mL/min — ABNORMAL LOW (ref >60)
Glucose, Bld: 93 mg/dL (ref 70–99)
Potassium: 4.5 mmol/L (ref 3.5–5.1)
Sodium: 140 mmol/L (ref 135–145)

## 2020-01-29 LAB — GLUCOSE, CAPILLARY
Glucose-Capillary: 87 mg/dL (ref 70–99)
Glucose-Capillary: 87 mg/dL (ref 70–99)
Glucose-Capillary: 91 mg/dL (ref 70–99)
Glucose-Capillary: 92 mg/dL (ref 70–99)

## 2020-01-29 LAB — HEMOGLOBIN A1C
Hgb A1c MFr Bld: 5 % (ref 4.8–5.6)
Mean Plasma Glucose: 96.8 mg/dL

## 2020-01-29 MED ORDER — ALBUTEROL SULFATE (2.5 MG/3ML) 0.083% IN NEBU
2.5000 mg | INHALATION_SOLUTION | Freq: Two times a day (BID) | RESPIRATORY_TRACT | Status: DC
  Administered 2020-01-29 – 2020-01-31 (×6): 2.5 mg via RESPIRATORY_TRACT
  Filled 2020-01-29 (×4): qty 3

## 2020-01-29 MED ORDER — PHENOL 1.4 % MT LIQD
1.0000 | OROMUCOSAL | Status: DC | PRN
  Administered 2020-01-31: 1 via OROMUCOSAL
  Filled 2020-01-29: qty 177

## 2020-01-29 NOTE — Progress Notes (Signed)
Patient states he uses nebulizer in am and before he goes to bed. Schedule neb has been ordered.

## 2020-01-29 NOTE — Progress Notes (Signed)
NG to right nare advanced approximately 5cm per Dr. Constance Haw. Patient tolerated with some discomfort. Continues low intermittent suction. Patient OOB to chair. Will continue to monitor.

## 2020-01-29 NOTE — Progress Notes (Signed)
PROGRESS NOTE    [name] [name]  HYQ:[id number] DOB: March 24, 1946 DOA: 01/28/2020 [facility]CP: [name]   Chief Complaint  Patient presents with  . Abdominal Pain    Brief Narrative:  As per H&P written by Dr. Denton Brick on 01/28/2020 74 y.o. male with medical history significant for coronary artery disease, non-Hodgkin's lymphoma, diabetes mellitus. Patient presented to the ED with complaints of abdominal pain, and vomiting that started last night.  Patient's last bowel movement was today-normal in consistency and amount.  Vomitus was nonbloody and nonbilious. No pain with urination.  No chest pain, no difficulty breathing.  ED Course: Stable vitals.  Creatinine 2.6.  Normal lipase 23.  WBC 9.2.  Abdominal CT scan-mid small bowel obstruction with questionable transition zone in the left mid abdomen. EDP talked with general surgeon on-call Dr. Arnoldo Morale recommended admission under hospitalist service.  Assessment & Plan: 1-SBO (small bowel obstruction) ([place]) -Presumed to be secondary to adhesions -General surgery on board, will follow recommendation Continue conservative management, IV fluids, electrolyte repletion and as needed analgesics -continue NGT intermittent suctioning.   2-acute kidney injury on chronic kidney disease stage IV -At baseline creatinine 2.2 -Most likely in the setting of poor oral intake and continue use of nephrotoxic agents -Minimize/avoid nephrotoxic agents for now. -follow trend; appropriately trending down currently.  3-hypothyroidism -Continue Synthroid  4-hypertension -Holding oral antihypertensive agents currently -Continue as needed hydralazine -Follow vital signs.  Type 2 diabetes with nephropathy -Continue sliding scale insulin and follow A1c results -CBG's every 6 hours while NPO.  6-combined systolic and diastolic heart failure -Appears stable and compensated -Continue to follow daily weights and strict I's and  O's -Most recent echo in April 2021 demonstrated ejection fraction 45 to 50% with grade 2 diastolic dysfunction. -Holding bisoprolol, Imdur, losartan and Lasix currently.  7-history of lymphoma- -status post chemotherapy -Continue follow-up surveillance with oncology as an outpatient.  8-chronic respiratory failure/COPD -Continue 2 L supplementation as chronically follow-up as an outpatient -Continue as needed nebulizers    DVT prophylaxis: SCDs Code Status: Full code Family Communication: No family at bedside. Disposition:   Status is: Inpatient   Dispo: The patient is from: Home              Anticipated d/c is to: Home              Anticipated d/c date is: 6/30--7/01              Patient currently is Not medically ready for discharge, still having abdominal pain, nausea with NG tube in place.  Images suggesting SBO.  Plan is for SBT in a.m., continue conservative management, IV fluid, electrolyte repletion, antiemetics and analgesics.       Consultants:   General surgery   Procedures:  See below for x-ray reports.   Antimicrobials:  None   Subjective: No fever, reports some intermittent nausea but no further vomiting; complaining of intermittent left lower quadrant abdominal pain.  No chest pain, no palpitations, no shortness of breath.  NG tube in place  Objective: Vitals:   01/29/20 0528 01/29/20 0818 01/29/20 0821 01/29/20 1444  BP: (!) 160/84   140/64  Pulse: 63   61  Resp: (!) 21   20  Temp: 98 F (36.7 C)   98.2 F (36.8 C)  TempSrc: Oral   Oral  SpO2: 90% (!) 84% 91% 92%  Weight:      Height:        Intake/Output Summary (Last  24 hours) at 01/29/2020 1611 Last data filed at 01/29/2020 1500 Gross per 24 hour  Intake 2213.77 ml  Output 800 ml  Net 1413.77 ml   Filed Weights   01/28/20 1308 01/28/20 2234  Weight: 136.1 kg (!) 136.3 kg    Examination:  General exam: Appears calm and comfortable, no fever, reports discomfort in his nostrils  from NG tube placement.  Some intermittent nausea but no further vomiting.  Expressed mild abdominal discomfort. Respiratory system: Clear to auscultation. Respiratory effort normal. Cardiovascular system: Regular rate, no rubs, no gallops, unable to properly assess JVD with body habitus. Gastrointestinal system: Abdomen is mildly distended, obese, no guarding, decreased bowel sounds. TTP on LLQ.  Central nervous system: Alert and oriented. No focal neurological deficits. Extremities: No cyanosis or clubbing; trace edema bilaterally. Skin: No rashes, no petechiae. Psychiatry: Judgement and insight appear normal. Mood & affect appropriate.     Data Reviewed: I have personally reviewed following labs and imaging studies  CBC: Recent Labs  Lab 01/28/20 1529  WBC 9.6  NEUTROABS 8.1*  HGB 17.2*  HCT 54.5*  MCV 89.3  PLT 141*    Basic Metabolic Panel: Recent Labs  Lab 01/28/20 1529 01/29/20 0456  NA 140 140  K 4.4 4.5  CL 101 106  CO2 25 24  GLUCOSE 116* 93  BUN 34* 31*  CREATININE 2.63* 2.36*  CALCIUM 9.1 8.5*    GFR: Estimated Creatinine Clearance: 41 mL/min (A) (by C-G formula based on SCr of 2.36 mg/dL (H)).  Liver Function Tests: Recent Labs  Lab 01/28/20 1529  AST 20  ALT 16  ALKPHOS 80  BILITOT 1.5*  PROT 8.4*  ALBUMIN 4.6    CBG: Recent Labs  Lab 01/28/20 2302 01/29/20 0545 01/29/20 1115  GLUCAP 92 91 87     Recent Results (from the past 240 hour(s))  SARS Coronavirus 2 by RT PCR (hospital order, performed in [facility] hospital lab) Nasopharyngeal Nasopharyngeal Swab     Status: None   Collection Time: 01/28/20  6:34 PM   Specimen: Nasopharyngeal Swab  Result Value Ref Range Status   SARS Coronavirus 2 NEGATIVE NEGATIVE Final    Comment: (NOTE) SARS-CoV-2 target nucleic acids are NOT DETECTED.  The SARS-CoV-2 RNA is generally detectable in upper and lower respiratory specimens during the acute phase of infection. The lowest concentration  of SARS-CoV-2 viral copies this assay can detect is 250 copies / mL. A negative result does not preclude SARS-CoV-2 infection and should not be used as the sole basis for treatment or other patient management decisions.  A negative result may occur with improper specimen collection / handling, submission of specimen other than nasopharyngeal swab, presence of viral mutation(s) within the areas targeted by this assay, and inadequate number of viral copies (<250 copies / mL). A negative result must be combined with clinical observations, patient history, and epidemiological information.  Fact Sheet for Patients:   StrictlyIdeas.no  Fact Sheet for Healthcare Providers: BankingDealers.co.za  This test is not yet approved or  cleared by the [place] FDA and has been authorized for detection and/or diagnosis of SARS-CoV-2 by FDA under an Emergency Use Authorization (EUA).  This EUA will remain in effect (meaning this test can be used) for the duration of the COVID-19 declaration under Section 564(b)(1) of the Act, 21 U.S.C. section 360bbb-3(b)(1), unless the authorization is terminated or revoked sooner.  Performed at [facility], [address] [place], [place] [address]      Radiology Studies:  CT ABDOMEN PELVIS WO CONTRAST  Result Date: 01/28/2020 CLINICAL DATA:  Generalized abdominal pain with nausea and vomiting since yesterday question bowel obstruction; history coronary artery disease post MI, chronic kidney disease, hypertension, non-Hodgkin's lymphoma, type II diabetes mellitus, prior cholecystectomy EXAM: CT ABDOMEN AND PELVIS WITHOUT CONTRAST TECHNIQUE: Multidetector CT imaging of the abdomen and pelvis was performed following the standard protocol without IV contrast. Sagittal and coronal MPR images reconstructed from axial data set. IV contrast could not be administered due to renal dysfunction. No oral contrast administered.  COMPARISON:  08/01/2019 FINDINGS: Lower chest: Bibasilar atelectasis.  Small LEFT pleural effusion. Hepatobiliary: Post cholecystectomy. Pneumobilia again identified present since at least PET-CT from 2017. No focal hepatic mass Pancreas: Normal appearance Spleen: Appears prominent in size 6.4 x 18.2 x 16.4 cm (volume = 1000 cm^3). No focal lesion. Adrenals/Urinary Tract: Adrenal glands, kidneys, and ureters normal appearance. Mild LEFT lateral bladder wall thickening, appears chronic with mild associated tenting. Stomach/Bowel: Normal appendix. Distended stomach. Dilated proximal small bowel loops with decompressed distal small bowel loops consistent with small bowel obstruction. Transition zone appears to be in the mid small bowel questionably in the LEFT mid abdomen coronal image 26. No evidence of perforation or abscess. No significant wall thickening. Scattered mild mesenteric edema. Colon decompressed. Vascular/Lymphatic: Atherosclerotic calcifications aorta and iliac arteries without aneurysm. No adenopathy. Reproductive: Unremarkable prostate gland and seminal vesicles Other: Calcific density inferior to the xiphoid process with adjacent fluid unchanged from prior studies. No free air or free fluid. No hernia. Musculoskeletal: Osseous structures unremarkable. IMPRESSION: Mid small bowel obstruction with question transition zone in the LEFT mid abdomen. Splenomegaly. Bibasilar atelectasis and small LEFT pleural effusion. Chronic mild LEFT lateral bladder wall thickening with associated tenting, stable. Again identified pneumobilia suspect prior sphincterotomy. Aortic Atherosclerosis (ICD10-I70.0). Electronically Signed   By: Lavonia Dana M.D.   On: 01/28/2020 17:22   DG Abd 1 View  Result Date: 01/29/2020 CLINICAL DATA:  Small bowel obstruction.  NG tube placement. EXAM: ABDOMEN - 1 VIEW COMPARISON:  CT scan of the abdomen dated 01/28/2020 FINDINGS: NG tube tip is in the fundus of the stomach. The proximal  side hole is in the distal esophagus at the gastroesophageal junction. Stomach and colon are not distended. Multiple dilated loops of small bowel as demonstrated on the prior CT scan. Cholecystectomy. IMPRESSION: NG tube tip in the fundus of the stomach. The proximal side hole is in the distal esophagus at the gastroesophageal junction. The tube could be advanced. Electronically Signed   By: Lorriane Shire M.D.   On: 01/29/2020 11:23    Scheduled Meds: . albuterol  2.5 mg Nebulization BID AC & HS  . insulin aspart  0-9 Units Subcutaneous Q6H  . mometasone-formoterol  2 puff Inhalation BID   Continuous Infusions: . sodium chloride 75 mL/hr at 01/29/20 1500  . famotidine (PEPCID) IV 20 mg (01/28/20 2259)     LOS: 0 days    Time spent: 30 minutes    Barton Dubois, MD [facility]   To contact the attending provider between 7A-7P or the covering provider during after hours 7P-7A, please log into the web site [email or web address] and access using universal [place] password for that web site. If you do not have the password, please call the hospital operator.  01/29/2020, 4:11 PM

## 2020-01-29 NOTE — Consult Note (Addendum)
I was present with the medical student for this service. I personally verified the history of present illness, performed the physical exam, and made the plan for this encounter. I have verified the medical student's documentation and made modifications where appropriately. I have personally documented in my own words a brief history, physical, and plan below.     Patient with CT findings concerning for SBO from adhesive disease. 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  Patient with nausea/vomiting and abdominal pain. Had BM yesterday and goes every other day. Pain improved but still with hiccups and nausea. Says he has some flatus yesterday.  CT reviewed with Dr. Thornton Papas, agree some degree of transition in the LLQ, no obvious masses or signs that this is from anything other than adhesions.   Patient with SBO that has no NG at this time but stomach distended on CT and hiccups. NG now, and plan for SBFT tomorrow at bedside. Warned patient we think this is from adhesions but could be related to his lymphoma, especially if this does not improve/ resolve.   Curlene Labrum, MD [facility] [address] [place], [place] [phone] (office)     Reason for Consult: Small Bowel Obstruction Referring Physician: Bethena Roys, MD  EMMANUELL KANTZ is an 74 y.o. male with a medical history of insulin-dependent diabetes, hypertension, hypothyroidism, non-hodgkin's Lymphoma and non-laproscopic cholecystectomy that presents with 1 day history of abdominal pain.   HPI: Mr.Piatkowski reports that he began to have diffuse abdominal pain, bloating, nausea yesterday morning and subsequent non-bilious vomiting. He reports vomiting 3-4x. He described the pain as "sharp"  rated 8/10 on pain scale and reports that the pain was so severe that his daughter called EMS. He has eaten since the pain occurred. He did have 1 bowel movement  yesterday and states that it was "normal". He has experienced similar abdominal pain in the past when diagnosed with Non-Hodgkin's Lymphoma but says these symptoms are "different". He reports some flatulence and belching, and reports it provided some relief. Denies any diarrhea or constipation.  He denies any fevers or chills. He does not report any recent illness or sick contacts.   Non-Contrast CT of Abdomen showed mid small bowel obstruction with questionable transition zone in the left mid abdomen.  Findings: Stomach/Bowel: Normal appendix. Distended stomach. Dilated proximal small bowel loops with decompressed distal small bowel loops consistent with small bowel obstruction. Transition zone appears to be in the mid small bowel questionably in the LEFT mid abdomen coronal image 26. No evidence of perforation or abscess. No significant wall thickening. Scattered mild mesenteric edema. Colon decompressed.  After admission, he was given NaCl 548mL bolus, Dilaudid 1mg , Acetaminophen suppository 650mg  PRN.  He currently states that his pain is 0/10.    Past Medical History:  Diagnosis Date  . CAD (coronary artery disease)    DES to LAD June 2018  . CKD (chronic kidney disease) stage 3, GFR 30-59 ml/min   . Essential hypertension   . History of pneumonia   . Hyperlipidemia   . Hypothyroidism   . LBBB (left bundle branch block)   . Morbid obesity ([place])   . Non Hodgkin's lymphoma ([place])    Status post XRT and chemotherapy  . NSTEMI (non-ST elevated myocardial infarction) [facility])    June 2018  . Peripheral neuropathy   . Sleep apnea   . Type 2 diabetes mellitus (H[facility])     Past  Surgical History:  Procedure Laterality Date  . CHOLECYSTECTOMY  1992  . COLONOSCOPY N/A 09/03/2014   SLF:six colon polyps removed/small internal hemorrhoids  . CORONARY STENT INTERVENTION N/A 01/21/2017   Procedure: Coronary Stent Intervention;  Surgeon: Nelva Bush, MD;  Location: [facility];   Service: Cardiovascular;  Laterality: N/A;  . ESOPHAGOGASTRODUODENOSCOPY N/A 09/03/2014   SLF: mild gastritis/few gastric polyps  . LEFT HEART CATH AND CORONARY ANGIOGRAPHY N/A 01/20/2017   Procedure: Left Heart Cath and Coronary Angiography;  Surgeon: Jettie Booze, MD;  Location: [facility];  Service: Cardiovascular;  Laterality: N/A;  . TOTAL KNEE ARTHROPLASTY  11/10/2011   Procedure: TOTAL KNEE ARTHROPLASTY;  Surgeon: Mauri Pole, MD;  Location: [facility];  Service: Orthopedics;  Laterality: Right;  . TOTAL KNEE ARTHROPLASTY Left 05/24/2018   Procedure: LEFT TOTAL KNEE ARTHROPLASTY;  Surgeon: Melrose Nakayama, MD;  Location: [facility];  Service: Orthopedics;  Laterality: Left;    Family History  Problem Relation Age of Onset  . Cancer Mother        breast and lung  . Cancer Father        bladder  . Cancer Maternal Uncle        prostate  . Cancer Paternal Uncle        esophagus  . Colon cancer Neg Hx     Social History:  reports that he has been smoking cigarettes. He has a 7.50 pack-year smoking history. He has never used smokeless tobacco. He reports that he does not drink alcohol and does not use drugs.  Allergies: No Known Allergies  Medications: I have reviewed the patient's current medications.  Results for orders placed or performed during the hospital encounter of 01/28/20 (from the past 48 hour(s))  CBC with Differential/Platelet     Status: Abnormal   Collection Time: 01/28/20  3:29 PM  Result Value Ref Range   WBC 9.6 4.0 - 10.5 K/uL   RBC 6.10 (H) 4.22 - 5.81 MIL/uL   Hemoglobin 17.2 (H) 13.0 - 17.0 g/dL   HCT 54.5 (H) 39 - 52 %   MCV 89.3 80.0 - 100.0 fL   MCH 28.2 26.0 - 34.0 pg   MCHC 31.6 30.0 - 36.0 g/dL   RDW 17.3 (H) 11.5 - 15.5 %   Platelets 141 (L) 150 - 400 K/uL   nRBC 0.0 0.0 - 0.2 %   Neutrophils Relative % 84 %   Neutro Abs 8.1 (H) 1.7 - 7.7 K/uL   Lymphocytes Relative 9 %   Lymphs Abs 0.9 0.7 - 4.0 K/uL   Monocytes Relative 5 %    Monocytes Absolute 0.5 0 - 1 K/uL   Eosinophils Relative 1 %   Eosinophils Absolute 0.1 0 - 0 K/uL   Basophils Relative 1 %   Basophils Absolute 0.1 0 - 0 K/uL   Immature Granulocytes 0 %   Abs Immature Granulocytes 0.02 0.00 - 0.07 K/uL    Comment: Performed at [facility], [address], [place], [place] [address]  Lipase, blood     Status: None   Collection Time: 01/28/20  3:29 PM  Result Value Ref Range   Lipase 23 11 - 51 U/L    Comment: Performed at [facility], [address], [place], [place] [address]  Comprehensive metabolic panel     Status: Abnormal   Collection Time: 01/28/20  3:29 PM  Result Value Ref Range   Sodium 140 135 - 145 mmol/L   Potassium 4.4  3.5 - 5.1 mmol/L   Chloride 101 98 - 111 mmol/L   CO2 25 22 - 32 mmol/L   Glucose, Bld 116 (H) 70 - 99 mg/dL    Comment: Glucose reference range applies only to samples taken after fasting for at least 8 hours.   BUN 34 (H) 8 - 23 mg/dL   Creatinine, Ser 2.63 (H) 0.61 - 1.24 mg/dL   Calcium 9.1 8.9 - 10.3 mg/dL   Total Protein 8.4 (H) 6.5 - 8.1 g/dL   Albumin 4.6 3.5 - 5.0 g/dL   AST 20 15 - 41 U/L   ALT 16 0 - 44 U/L   Alkaline Phosphatase 80 38 - 126 U/L   Total Bilirubin 1.5 (H) 0.3 - 1.2 mg/dL   GFR calc non Af Amer 23 (L) >60 mL/min   GFR calc Af Amer 27 (L) >60 mL/min   Anion gap 14 5 - 15    Comment: Performed at [facility], [address] [place], [place] [address]  TSH     Status: None   Collection Time: 01/28/20  3:45 PM  Result Value Ref Range   TSH 1.738 0.350 - 4.500 uIU/mL    Comment: Performed by a 3rd Generation assay with a functional sensitivity of <=0.01 uIU/mL. Performed at [facility], [address], [place], [place] [address]   SARS Coronavirus 2 by RT PCR (hospital order, performed in [facility] hospital lab) Nasopharyngeal Nasopharyngeal Swab     Status: None   Collection Time: 01/28/20  6:34 PM   Specimen: Nasopharyngeal Swab  Result Value Ref Range   SARS Coronavirus 2 NEGATIVE  NEGATIVE    Comment: (NOTE) SARS-CoV-2 target nucleic acids are NOT DETECTED.  The SARS-CoV-2 RNA is generally detectable in upper and lower respiratory specimens during the acute phase of infection. The lowest concentration of SARS-CoV-2 viral copies this assay can detect is 250 copies / mL. A negative result does not preclude SARS-CoV-2 infection and should not be used as the sole basis for treatment or other patient management decisions.  A negative result may occur with improper specimen collection / handling, submission of specimen other than nasopharyngeal swab, presence of viral mutation(s) within the areas targeted by this assay, and inadequate number of viral copies (<250 copies / mL). A negative result must be combined with clinical observations, patient history, and epidemiological information.  Fact Sheet for Patients:   StrictlyIdeas.no  Fact Sheet for Healthcare Providers: BankingDealers.co.za  This test is not yet approved or  cleared by the [place] FDA and has been authorized for detection and/or diagnosis of SARS-CoV-2 by FDA under an Emergency Use Authorization (EUA).  This EUA will remain in effect (meaning this test can be used) for the duration of the COVID-19 declaration under Section 564(b)(1) of the Act, 21 U.S.C. section 360bbb-3(b)(1), unless the authorization is terminated or revoked sooner.  Performed at [facility], [address], [place], [place] [address]   Glucose, capillary     Status: None   Collection Time: 01/28/20 11:02 PM  Result Value Ref Range   Glucose-Capillary 92 70 - 99 mg/dL    Comment: Glucose reference range applies only to samples taken after fasting for at least 8 hours.   Comment 1 Notify RN   Basic metabolic panel     Status: Abnormal   Collection Time: 01/29/20  4:56 AM  Result Value Ref Range   Sodium 140 135 - 145 mmol/L   Potassium 4.5 3.5 - 5.1 mmol/L  Chloride 106  98 - 111 mmol/L   CO2 24 22 - 32 mmol/L   Glucose, Bld 93 70 - 99 mg/dL    Comment: Glucose reference range applies only to samples taken after fasting for at least 8 hours.   BUN 31 (H) 8 - 23 mg/dL   Creatinine, Ser 2.36 (H) 0.61 - 1.24 mg/dL   Calcium 8.5 (L) 8.9 - 10.3 mg/dL   GFR calc non Af Amer 27 (L) >60 mL/min   GFR calc Af Amer 31 (L) >60 mL/min   Anion gap 10 5 - 15    Comment: Performed at [facility], [address], [place], [place] [address]  Glucose, capillary     Status: None   Collection Time: 01/29/20  5:45 AM  Result Value Ref Range   Glucose-Capillary 91 70 - 99 mg/dL    Comment: Glucose reference range applies only to samples taken after fasting for at least 8 hours.   Comment 1 Notify RN     CT ABDOMEN PELVIS WO CONTRAST  Result Date: 01/28/2020 CLINICAL DATA:  Generalized abdominal pain with nausea and vomiting since yesterday question bowel obstruction; history coronary artery disease post MI, chronic kidney disease, hypertension, non-Hodgkin's lymphoma, type II diabetes mellitus, prior cholecystectomy EXAM: CT ABDOMEN AND PELVIS WITHOUT CONTRAST TECHNIQUE: Multidetector CT imaging of the abdomen and pelvis was performed following the standard protocol without IV contrast. Sagittal and coronal MPR images reconstructed from axial data set. IV contrast could not be administered due to renal dysfunction. No oral contrast administered. COMPARISON:  08/01/2019 FINDINGS: Lower chest: Bibasilar atelectasis.  Small LEFT pleural effusion. Hepatobiliary: Post cholecystectomy. Pneumobilia again identified present since at least PET-CT from 2017. No focal hepatic mass Pancreas: Normal appearance Spleen: Appears prominent in size 6.4 x 18.2 x 16.4 cm (volume = 1000 cm^3). No focal lesion. Adrenals/Urinary Tract: Adrenal glands, kidneys, and ureters normal appearance. Mild LEFT lateral bladder wall thickening, appears chronic with mild associated tenting. Stomach/Bowel: Normal  appendix. Distended stomach. Dilated proximal small bowel loops with decompressed distal small bowel loops consistent with small bowel obstruction. Transition zone appears to be in the mid small bowel questionably in the LEFT mid abdomen coronal image 26. No evidence of perforation or abscess. No significant wall thickening. Scattered mild mesenteric edema. Colon decompressed. Vascular/Lymphatic: Atherosclerotic calcifications aorta and iliac arteries without aneurysm. No adenopathy. Reproductive: Unremarkable prostate gland and seminal vesicles Other: Calcific density inferior to the xiphoid process with adjacent fluid unchanged from prior studies. No free air or free fluid. No hernia. Musculoskeletal: Osseous structures unremarkable. IMPRESSION: Mid small bowel obstruction with question transition zone in the LEFT mid abdomen. Splenomegaly. Bibasilar atelectasis and small LEFT pleural effusion. Chronic mild LEFT lateral bladder wall thickening with associated tenting, stable. Again identified pneumobilia suspect prior sphincterotomy. Aortic Atherosclerosis (ICD10-I70.0). Electronically Signed   By: Lavonia Dana M.D.   On: 01/28/2020 17:22    ROS:  Pertinent items noted in HPI and remainder of comprehensive ROS otherwise negative.     Physical Exam:   General: Patient laying comfortably in bed. NAD. Conversant  Head: NCAT Eyes: Normal conjunctiva Mouth/Throat: Oropharynx and mucus membranes mildly dry. Neck: Supple, No palpable cervical lymphadenopathy. Cardiovascular: Normal Rate and regular rhythm Pulmonary: Normal respiratory effort. Normal breath sounds bilaterally Abdominal: Soft, distended. Tenderness to palpation in Left Lower Quadrant. Musculoskeletal: He exhibits no edema.  Neurological: He is alert and oriented to person, place, and time. Skin: Skin is warm and dry.  Psychiatric: He has a normal  mood and affect. His behavior is normal. Thought content normal      Assessment/Plan:  -Small Bowel Obstruction Mr Sem is 74 yo male presenting with 1-day history of diffuse abdominal pain suspicious for small bowel obstruction.  Given a history of radiation treated Non-Hodgkin's lymphoma, non-laproscopic cholecystectomy and bladder biopsy in 2016 , adhesions could be a possible cause for SBO.  Non-Contrast CT of Abdomen showed mid small bowel obstruction with questionable transition zone in the left mid abdomen. Reviewed CT with radiologist: Dr. Delbert Phenix.  -NG tube placement ordered for decompression -KUB to investigate tomorrow morning.    Theodis Sato 01/29/2020, 9:59 AM

## 2020-01-30 ENCOUNTER — Inpatient Hospital Stay (HOSPITAL_COMMUNITY): Payer: Medicare Other

## 2020-01-30 LAB — GLUCOSE, CAPILLARY
Glucose-Capillary: 70 mg/dL (ref 70–99)
Glucose-Capillary: 74 mg/dL (ref 70–99)
Glucose-Capillary: 77 mg/dL (ref 70–99)
Glucose-Capillary: 78 mg/dL (ref 70–99)
Glucose-Capillary: 79 mg/dL (ref 70–99)

## 2020-01-30 MED ORDER — DIATRIZOATE MEGLUMINE & SODIUM 66-10 % PO SOLN
ORAL | Status: AC
  Filled 2020-01-30: qty 90

## 2020-01-30 MED ORDER — DIATRIZOATE MEGLUMINE & SODIUM 66-10 % PO SOLN: 90.0000 mL | Freq: Once | ORAL | Status: AC

## 2020-01-30 MED ORDER — LEVOTHYROXINE SODIUM 100 MCG/5ML IV SOLN
150.0000 ug | Freq: Every day | INTRAVENOUS | Status: DC
  Administered 2020-01-30 – 2020-02-01 (×3): 150 ug via INTRAVENOUS
  Filled 2020-01-30 (×3): qty 10

## 2020-01-30 MED ORDER — DIATRIZOATE MEGLUMINE & SODIUM 66-10 % PO SOLN
90.0000 mL | Freq: Once | ORAL | Status: AC
  Administered 2020-01-30: 90 mL via NASOGASTRIC
  Filled 2020-01-30: qty 90

## 2020-01-30 NOTE — Progress Notes (Signed)
PROGRESS NOTE    [name] [name]  ZYS:[id number] DOB: Apr 17, 1946 DOA: 01/28/2020 P[facility]: [name]   Chief Complaint  Patient presents with  . Abdominal Pain    Brief Narrative:  As per H&P written by Dr. Denton Brick on 01/28/2020 74 y.o. male with medical history significant for coronary artery disease, non-Hodgkin's lymphoma, diabetes mellitus. Patient presented to the ED with complaints of abdominal pain, and vomiting that started last night.  Patient's last bowel movement was today-normal in consistency and amount.  Vomitus was nonbloody and nonbilious. No pain with urination.  No chest pain, no difficulty breathing.  ED Course: Stable vitals.  Creatinine 2.6.  Normal lipase 23.  WBC 9.2.  Abdominal CT scan-mid small bowel obstruction with questionable transition zone in the left mid abdomen. EDP talked with general surgeon on-call Dr. Arnoldo Morale recommended admission under hospitalist service.  Assessment & Plan: 1-SBO (small bowel obstruction) ([place]) -Presumed to be secondary to adhesions -General surgery on board, will follow recommendation Continue conservative management, IV fluids, electrolyte repletion and as needed analgesics -continue NGT intermittent suctioning.   2-acute kidney injury on chronic kidney disease stage IV -At baseline creatinine 2.2 -Most likely in the setting of poor oral intake and continue use of nephrotoxic agents -Minimize/avoid nephrotoxic agents for now. -follow trend; appropriately trending down currently.  3-hypothyroidism -Continue Synthroid (IV currently)  4-hypertension -Holding oral antihypertensive agents currently -Continue as needed hydralazine -Follow vital signs.  Type 2 diabetes with nephropathy -Continue sliding scale insulin and follow A1c results -CBG's every 6 hours while NPO.  6-combined systolic and diastolic heart failure -Appears stable and compensated -Continue to follow daily weights and strict  I's and O's -Most recent echo in April 2021 demonstrated ejection fraction 45 to 50% with grade 2 diastolic dysfunction. -Holding bisoprolol, Imdur, losartan and Lasix currently; will resume when tolerating PO's.  7-history of lymphoma- -status post chemotherapy -Continue follow-up surveillance with oncology as an outpatient.  8-chronic respiratory failure/COPD -Continue 2 L supplementation as chronically follow-up as an outpatient -Continue as needed nebulizers    DVT prophylaxis: SCDs Code Status: Full code Family Communication: No family at bedside. Disposition:   Status is: Inpatient   Dispo: The patient is from: Home              Anticipated d/c is to: Home              Anticipated d/c date is: 6/30--7/01              Patient currently is Not medically ready for discharge, still with NGT in place and no demonstrating ability to tolerate PO's yet. Resolving SBO.  Plan is for SBFT later today. Continue conservative management, IV fluid, electrolyte repletion, antiemetics and analgesics.       Consultants:   General surgery   Procedures:  See below for x-ray reports.   Antimicrobials:  None   Subjective: No fever, reports no nausea or vomiting.  NG tube in place.  Expressed having bowel movement overnight and is passing gas.  Objective: Vitals:   01/29/20 2113 01/30/20 0515 01/30/20 0745 01/30/20 1525  BP: (!) 143/61 (!) 142/80  (!) 151/72  Pulse: (!) 52 66  81  Resp: 19 19  20   Temp: 97.8 F (36.6 C) 98.1 F (36.7 C)  (!) 97.4 F (36.3 C)  TempSrc: Oral Oral  Oral  SpO2: 93% 91% 95% 96%  Weight:      Height:        Intake/Output  Summary (Last 24 hours) at 01/30/2020 1742 Last data filed at 01/30/2020 1500 Gross per 24 hour  Intake 603.59 ml  Output 2100 ml  Net -1496.41 ml   Filed Weights   01/28/20 1308 01/28/20 2234  Weight: 136.1 kg (!) 136.3 kg    Examination: General exam: Alert, awake, oriented x 3; reports that he had an NG tube.  No  fever, had large bowel movement overnight and is passing gas.  No chest pain, no shortness of breath.  NG tube in place. Respiratory system: Clear to auscultation. Respiratory effort normal. Cardiovascular system:RRR. No murmurs, rubs, gallops. Gastrointestinal system: Abdomen is nondistended, soft and without guarding.  Decreased bowel sounds appreciated.  Reports no pain on palpation currently. Central nervous system: Alert and oriented. No focal neurological deficits. Extremities: No cyanosis or clubbing.  Trace edema appreciated bilaterally. Skin: No rashes, no petechiae. Psychiatry: Judgement and insight appear normal. Mood & affect appropriate.     Data Reviewed: I have personally reviewed following labs and imaging studies  CBC: Recent Labs  Lab 01/28/20 1529  WBC 9.6  NEUTROABS 8.1*  HGB 17.2*  HCT 54.5*  MCV 89.3  PLT 141*    Basic Metabolic Panel: Recent Labs  Lab 01/28/20 1529 01/29/20 0456  NA 140 140  K 4.4 4.5  CL 101 106  CO2 25 24  GLUCOSE 116* 93  BUN 34* 31*  CREATININE 2.63* 2.36*  CALCIUM 9.1 8.5*    GFR: Estimated Creatinine Clearance: 41 mL/min (A) (by C-G formula based on SCr of 2.36 mg/dL (H)).  Liver Function Tests: Recent Labs  Lab 01/28/20 1529  AST 20  ALT 16  ALKPHOS 80  BILITOT 1.5*  PROT 8.4*  ALBUMIN 4.6    CBG: Recent Labs  Lab 01/29/20 2355 01/30/20 0549 01/30/20 0733 01/30/20 1156 01/30/20 1701  GLUCAP 87 74 79 78 77     Recent Results (from the past 240 hour(s))  SARS Coronavirus 2 by RT PCR (hospital order, performed in [facility] hospital lab) Nasopharyngeal Nasopharyngeal Swab     Status: None   Collection Time: 01/28/20  6:34 PM   Specimen: Nasopharyngeal Swab  Result Value Ref Range Status   SARS Coronavirus 2 NEGATIVE NEGATIVE Final    Comment: (NOTE) SARS-CoV-2 target nucleic acids are NOT DETECTED.  The SARS-CoV-2 RNA is generally detectable in upper and lower respiratory specimens during the  acute phase of infection. The lowest concentration of SARS-CoV-2 viral copies this assay can detect is 250 copies / mL. A negative result does not preclude SARS-CoV-2 infection and should not be used as the sole basis for treatment or other patient management decisions.  A negative result may occur with improper specimen collection / handling, submission of specimen other than nasopharyngeal swab, presence of viral mutation(s) within the areas targeted by this assay, and inadequate number of viral copies (<250 copies / mL). A negative result must be combined with clinical observations, patient history, and epidemiological information.  Fact Sheet for Patients:   StrictlyIdeas.no  Fact Sheet for Healthcare Providers: BankingDealers.co.za  This test is not yet approved or  cleared by the [place] FDA and has been authorized for detection and/or diagnosis of SARS-CoV-2 by FDA under an Emergency Use Authorization (EUA).  This EUA will remain in effect (meaning this test can be used) for the duration of the COVID-19 declaration under Section 564(b)(1) of the Act, 21 U.S.C. section 360bbb-3(b)(1), unless the authorization is terminated or revoked sooner.  Performed at [facility] [place],  [address], [place], [place] [address]      Radiology Studies: DG Abd 1 View  Result Date: 01/29/2020 CLINICAL DATA:  Small bowel obstruction.  NG tube placement. EXAM: ABDOMEN - 1 VIEW COMPARISON:  CT scan of the abdomen dated 01/28/2020 FINDINGS: NG tube tip is in the fundus of the stomach. The proximal side hole is in the distal esophagus at the gastroesophageal junction. Stomach and colon are not distended. Multiple dilated loops of small bowel as demonstrated on the prior CT scan. Cholecystectomy. IMPRESSION: NG tube tip in the fundus of the stomach. The proximal side hole is in the distal esophagus at the gastroesophageal junction. The tube could be  advanced. Electronically Signed   By: Lorriane Shire M.D.   On: 01/29/2020 11:23   DG Abd Portable 1V-Small Bowel Protocol-Position Verification  Result Date: 01/30/2020 CLINICAL DATA:  Small-bowel obstruction, NG tube placement EXAM: PORTABLE ABDOMEN - 1 VIEW COMPARISON:  One day prior FINDINGS: Enteric tube is within the stomach inclusive of side port. Dilated loops of small bowel again identified. IMPRESSION: Enteric tube and side port within the stomach. Electronically Signed   By: Macy Mis M.D.   On: 01/30/2020 09:28    Scheduled Meds: . albuterol  2.5 mg Nebulization BID AC & HS  . insulin aspart  0-9 Units Subcutaneous Q6H  . mometasone-formoterol  2 puff Inhalation BID   Continuous Infusions: . sodium chloride 75 mL/hr at 01/29/20 1500  . famotidine (PEPCID) IV 20 mg (01/29/20 2154)     LOS: 1 day    Time spent: 30 minutes    Barton Dubois, MD [facility]   To contact the attending provider between 7A-7P or the covering provider during after hours 7P-7A, please log into the web site [email or web address] and access using universal [place] password for that web site. If you do not have the password, please call the hospital operator.  01/30/2020, 5:42 PM

## 2020-01-30 NOTE — Progress Notes (Signed)
[facility] Progress Note     Subjective: Large BM that has soiled bed now. Patient also feels like NG may be out some.  Having flatus too. SBFT ordered this AM. Repeat due at 1845.   Objective: Vital signs in last 24 hours: Temp:  [97.4 F (36.3 C)-98.1 F (36.7 C)] 97.4 F (36.3 C) (06/29 1525) Pulse Rate:  [52-81] 81 (06/29 1525) Resp:  [16-20] 20 (06/29 1525) BP: (142-151)/(61-80) 151/72 (06/29 1525) SpO2:  [89 %-96 %] 96 % (06/29 1525) Last BM Date: 01/28/20  Intake/Output from previous day: 06/28 0701 - 06/29 0700 In: 1391.8 [I.V.:1341.8; IV Piggyback:50] Out: 700 [Urine:700] Intake/Output this shift: Total I/O In: -  Out: 1400 [Urine:500; Emesis/NG output:900]  General appearance: alert, cooperative and mild distress Resp: normal work of breathing GI: soft, nondistended, obese, RUQ scar  Lab Results:  Recent Labs    01/28/20 1529  WBC 9.6  HGB 17.2*  HCT 54.5*  PLT 141*   BMET Recent Labs    01/28/20 1529 01/29/20 0456  NA 140 140  K 4.4 4.5  CL 101 106  CO2 25 24  GLUCOSE 116* 93  BUN 34* 31*  CREATININE 2.63* 2.36*  CALCIUM 9.1 8.5*   PT/INR No results for input(s): LABPROT, INR in the last 72 hours.  Studies/Results: DG Abd 1 View  Result Date: 01/29/2020 CLINICAL DATA:  Small bowel obstruction.  NG tube placement. EXAM: ABDOMEN - 1 VIEW COMPARISON:  CT scan of the abdomen dated 01/28/2020 FINDINGS: NG tube tip is in the fundus of the stomach. The proximal side hole is in the distal esophagus at the gastroesophageal junction. Stomach and colon are not distended. Multiple dilated loops of small bowel as demonstrated on the prior CT scan. Cholecystectomy. IMPRESSION: NG tube tip in the fundus of the stomach. The proximal side hole is in the distal esophagus at the gastroesophageal junction. The tube could be advanced. Electronically Signed   By: Lorriane Shire M.D.   On: 01/29/2020 11:23   DG Abd Portable 1V-Small Bowel  Protocol-Position Verification  Result Date: 01/30/2020 CLINICAL DATA:  Small-bowel obstruction, NG tube placement EXAM: PORTABLE ABDOMEN - 1 VIEW COMPARISON:  One day prior FINDINGS: Enteric tube is within the stomach inclusive of side port. Dilated loops of small bowel again identified. IMPRESSION: Enteric tube and side port within the stomach. Electronically Signed   By: Macy Mis M.D.   On: 01/30/2020 09:28    Anti-infectives: Anti-infectives (From admission, onward)   None      Assessment/Plan: [name] [name] is a 74 yo with a SBO from likely adhesions but also with a history of marginal zone lymphoma. Has had BM.  -Asked radiology to come do Xray early given BM and Ng possibly being dislodged, so we can go ahead and remove if Xray improving    LOS: 1 day    Virl Cagey 01/30/2020

## 2020-01-30 NOTE — Progress Notes (Signed)
Still with obstructive pattern on X-ray but did have BM. Keep ng for now and plan for repeat xray in AM.  Curlene Labrum, MD

## 2020-01-31 ENCOUNTER — Inpatient Hospital Stay (HOSPITAL_COMMUNITY): Payer: Medicare Other

## 2020-01-31 LAB — CBC
HCT: 47.3 % (ref 39.0–52.0)
Hemoglobin: 14.7 g/dL (ref 13.0–17.0)
MCH: 28.2 pg (ref 26.0–34.0)
MCHC: 31.1 g/dL (ref 30.0–36.0)
MCV: 90.8 fL (ref 80.0–100.0)
Platelets: 131 10*3/uL — ABNORMAL LOW (ref 150–400)
RBC: 5.21 MIL/uL (ref 4.22–5.81)
RDW: 15.9 % — ABNORMAL HIGH (ref 11.5–15.5)
WBC: 6.7 10*3/uL (ref 4.0–10.5)
nRBC: 0 % (ref 0.0–0.2)

## 2020-01-31 LAB — GLUCOSE, CAPILLARY
Glucose-Capillary: 83 mg/dL (ref 70–99)
Glucose-Capillary: 78 mg/dL (ref 70–99)
Glucose-Capillary: 81 mg/dL (ref 70–99)
Glucose-Capillary: 80 mg/dL (ref 70–99)
Glucose-Capillary: 140 mg/dL — ABNORMAL HIGH (ref 70–99)
Glucose-Capillary: 160 mg/dL — ABNORMAL HIGH (ref 70–99)

## 2020-01-31 LAB — BASIC METABOLIC PANEL
Anion gap: 11 (ref 5–15)
BUN: 39 mg/dL — ABNORMAL HIGH (ref 8–23)
CO2: 20 mmol/L — ABNORMAL LOW (ref 22–32)
Calcium: 8.4 mg/dL — ABNORMAL LOW (ref 8.9–10.3)
Chloride: 112 mmol/L — ABNORMAL HIGH (ref 98–111)
Creatinine, Ser: 2.39 mg/dL — ABNORMAL HIGH (ref 0.61–1.24)
GFR calc Af Amer: 30 mL/min — ABNORMAL LOW (ref >60)
GFR calc non Af Amer: 26 mL/min — ABNORMAL LOW (ref >60)
Glucose, Bld: 87 mg/dL (ref 70–99)
Potassium: 3.8 mmol/L (ref 3.5–5.1)
Sodium: 143 mmol/L (ref 135–145)

## 2020-01-31 MED ORDER — INSULIN ASPART 100 UNIT/ML ~~LOC~~ SOLN: 0.0000 [IU] | SUBCUTANEOUS | Status: DC

## 2020-01-31 MED ORDER — ALBUTEROL SULFATE (2.5 MG/3ML) 0.083% IN NEBU
2.5000 mg | INHALATION_SOLUTION | Freq: Two times a day (BID) | RESPIRATORY_TRACT | Status: DC
  Administered 2020-02-01: 2.5 mg via RESPIRATORY_TRACT
  Filled 2020-01-31: qty 3

## 2020-01-31 MED ORDER — INSULIN ASPART 100 UNIT/ML ~~LOC~~ SOLN: 0.0000 [IU] | Freq: Three times a day (TID) | SUBCUTANEOUS | Status: DC

## 2020-01-31 MED ORDER — DEXTROSE-NACL 5-0.9 % IV SOLN: INTRAVENOUS | Status: DC

## 2020-01-31 MED ORDER — INSULIN ASPART 100 UNIT/ML ~~LOC~~ SOLN: 0.0000 [IU] | Freq: Every day | SUBCUTANEOUS | Status: DC

## 2020-01-31 NOTE — Discharge Summary (Addendum)
PROGRESS NOTE    [name] [name]  JME:[id number] DOB: 03/13/1946 DOA: 01/28/2020 PCP: [name]   Chief Complaint  Patient presents with   Abdominal Pain   Patient seen and examined on 01/31/2020, progress notes from 01/31/2020 erroneously entered as a discharge summary it is actually a progress note   Brief Narrative:  As per H&P written by Dr. Denton Brick on 01/28/2020 74 y.o. male with medical history significant for coronary artery disease, non-Hodgkin's lymphoma, diabetes mellitus. Patient presented to the ED with complaints of abdominal pain, and vomiting that started last night.  Patient's last bowel movement was today-normal in consistency and amount.  Vomitus was nonbloody and nonbilious. No pain with urination.  No chest pain, no difficulty breathing.  ED Course: Stable vitals.  Creatinine 2.6.  Normal lipase 23.  WBC 9.2.  Abdominal CT scan-mid small bowel obstruction with questionable transition zone in the left mid abdomen. EDP talked with general surgeon on-call Dr. Arnoldo Morale recommended admission under hospitalist service.  Assessment & Plan: 1-SBO (small bowel obstruction) ([place]) -Presumed to be secondary to adhesions -General surgery on board, will follow recommendation Continue conservative management, IV fluids, electrolyte repletion and as needed analgesics -NG tube removed, patient tolerating sips of liquids with ice chips as well -Had one liquid stool.   2-acute kidney injury on chronic kidney disease stage IV -At baseline creatinine 2.2 -Most likely in the setting of poor oral intake and continue use of nephrotoxic agents -Minimize/avoid nephrotoxic agents for now. -follow trend; appropriately trending down currently.  3-hypothyroidism -Continue Synthroid (IV currently)  4-hypertension -Holding oral antihypertensive agents currently -Continue as needed hydralazine -Follow vital signs.  Type 2 diabetes with nephropathy -Continue  sliding scale insulin and follow A1c results -CBG's every 6 hours while NPO.  6-combined systolic and diastolic heart failure -Appears stable and compensated -Continue to follow daily weights and strict I's and O's -Most recent echo in April 2021 demonstrated ejection fraction 45 to 50% with grade 2 diastolic dysfunction. -Holding bisoprolol, Imdur, losartan and Lasix currently; will resume when tolerating PO's.  7-history of lymphoma- -status post chemotherapy -Continue follow-up surveillance with oncology as an outpatient.  8-chronic respiratory failure/COPD -Continue 2 L supplementation as chronically follow-up as an outpatient -Continue as needed nebulizers    DVT prophylaxis: SCDs Code Status: Full code Family Communication: No family at bedside. Disposition:   Status is: Inpatient   Dispo: The patient is from: Home              Anticipated d/c is to: Home              Anticipated d/c date is: 7/01              Patient currently is Not medically ready for discharge,- no demonstrating ability to tolerate PO's yet. Resolving SBO.    Continue conservative management, IV fluid, electrolyte repletion, antiemetics and analgesics.     Consultants:   General surgery   Procedures:  See below for x-ray reports.   Antimicrobials:  None   Subjective: NG tube removed, had liquid stool, tolerating ice chips and sips of clear liquids  - Patient seen and examined on 01/31/2020, progress notes from 01/31/2020 erroneously entered as a discharge summary it is actually a progress note  Objective: Vitals:   01/30/20 2120 01/31/20 0638 01/31/20 0818 01/31/20 1419  BP: 128/82 (!) 164/74  122/89  Pulse: (!) 59 63    Resp:    20  Temp: 98.2 F (36.8 C)  98.2 F (36.8 C)  98.1 F (36.7 C)  TempSrc: Oral Oral  Oral  SpO2: 97% 94% 90%   Weight:      Height:        Intake/Output Summary (Last 24 hours) at 01/31/2020 1916 Last data filed at 01/31/2020 1800 Gross per 24 hour    Intake 2449.61 ml  Output 3500 ml  Net -1050.39 ml   Filed Weights   01/28/20 1308 01/28/20 2234  Weight: 136.1 kg (!) 136.3 kg    Examination: General exam: Alert, awake, oriented x 3; r Respiratory system: Clear to auscultation. Respiratory effort normal. Cardiovascular system:RRR. No murmurs, rubs, gallops. Gastrointestinal system: Abdomen is nondistended, soft and without guarding.  Decreased bowel sounds appreciated.  Reports no pain on palpation currently. Central nervous system: Alert and oriented. No focal neurological deficits. Extremities: No cyanosis or clubbing.  Trace edema appreciated bilaterally. Skin: No rashes, no petechiae. Psychiatry: Judgement and insight appear normal. Mood & affect appropriate.     Data Reviewed: I have personally reviewed following labs and imaging studies  CBC: Recent Labs  Lab 01/28/20 1529 01/31/20 0500  WBC 9.6 6.7  NEUTROABS 8.1*  --   HGB 17.2* 14.7  HCT 54.5* 47.3  MCV 89.3 90.8  PLT 141* 131*    Basic Metabolic Panel: Recent Labs  Lab 01/28/20 1529 01/29/20 0456 01/31/20 0500  NA 140 140 143  K 4.4 4.5 3.8  CL 101 106 112*  CO2 25 24 20*  GLUCOSE 116* 93 87  BUN 34* 31* 39*  CREATININE 2.63* 2.36* 2.39*  CALCIUM 9.1 8.5* 8.4*    GFR: Estimated Creatinine Clearance: 40.5 mL/min (A) (by C-G formula based on SCr of 2.39 mg/dL (H)).  Liver Function Tests: Recent Labs  Lab 01/28/20 1529  AST 20  ALT 16  ALKPHOS 80  BILITOT 1.5*  PROT 8.4*  ALBUMIN 4.6    CBG: Recent Labs  Lab 01/31/20 0330 01/31/20 0634 01/31/20 0748 01/31/20 1108 01/31/20 1649  GLUCAP 83 78 81 80 140*     Recent Results (from the past 240 hour(s))  SARS Coronavirus 2 by RT PCR (hospital order, performed in [facility] hospital lab) Nasopharyngeal Nasopharyngeal Swab     Status: None   Collection Time: 01/28/20  6:34 PM   Specimen: Nasopharyngeal Swab  Result Value Ref Range Status   SARS Coronavirus 2 NEGATIVE NEGATIVE  Final    Comment: (NOTE) SARS-CoV-2 target nucleic acids are NOT DETECTED.  The SARS-CoV-2 RNA is generally detectable in upper and lower respiratory specimens during the acute phase of infection. The lowest concentration of SARS-CoV-2 viral copies this assay can detect is 250 copies / mL. A negative result does not preclude SARS-CoV-2 infection and should not be used as the sole basis for treatment or other patient management decisions.  A negative result may occur with improper specimen collection / handling, submission of specimen other than nasopharyngeal swab, presence of viral mutation(s) within the areas targeted by this assay, and inadequate number of viral copies (<250 copies / mL). A negative result must be combined with clinical observations, patient history, and epidemiological information.  Fact Sheet for Patients:   StrictlyIdeas.no  Fact Sheet for Healthcare Providers: BankingDealers.co.za  This test is not yet approved or  cleared by the [place] FDA and has been authorized for detection and/or diagnosis of SARS-CoV-2 by FDA under an Emergency Use Authorization (EUA).  This EUA will remain in effect (meaning this test can be used) for the duration of  the COVID-19 declaration under Section 564(b)(1) of the Act, 21 U.S.C. section 360bbb-3(b)(1), unless the authorization is terminated or revoked sooner.  Performed at [facility], [address], [place], [place] [address]      Radiology Studies: DG Abd 1 View  Result Date: 01/31/2020 CLINICAL DATA:  Evaluate for NG tube placement. EXAM: ABDOMEN - 1 VIEW COMPARISON:  01/30/2020 FINDINGS: Previously noted nasogastric tube has been removed. Cholecystectomy clips noted. Persistent dilated loops of small bowel identified. Enteric contrast material is identified within the colon up to the level of the sigmoid colon. IMPRESSION: 1. Interval removal of NG tube. 2. Persistent  dilated loops of small bowel compatible with small bowel obstruction. 3. Enteric contrast material identified within the colon up to the sigmoid colon. Electronically Signed   By: Kerby Moors M.D.   On: 01/31/2020 09:53   DG Abd Portable 1V-Small Bowel Obstruction Protocol-initial, 8 hr delay  Result Date: 01/30/2020 CLINICAL DATA:  Small bowel obstruction EXAM: PORTABLE ABDOMEN - 1 VIEW COMPARISON:  01/30/2020 FINDINGS: NG tube is in place with the tip in the stomach. Prior cholecystectomy. Continued dilated small bowel loops. Gas seen within the colon. Findings compatible with partial small bowel obstruction. Overall no real change. IMPRESSION: Overall no significant change in the small bowel obstruction pattern. Electronically Signed   By: Rolm Baptise M.D.   On: 01/30/2020 19:21   DG Abd Portable 1V-Small Bowel Protocol-Position Verification  Result Date: 01/30/2020 CLINICAL DATA:  Small-bowel obstruction, NG tube placement EXAM: PORTABLE ABDOMEN - 1 VIEW COMPARISON:  One day prior FINDINGS: Enteric tube is within the stomach inclusive of side port. Dilated loops of small bowel again identified. IMPRESSION: Enteric tube and side port within the stomach. Electronically Signed   By: Macy Mis M.D.   On: 01/30/2020 09:28    Scheduled Meds:  albuterol  2.5 mg Nebulization BID AC & HS   insulin aspart  0-5 Units Subcutaneous QHS   insulin aspart  0-5 Units Subcutaneous Q4H   levothyroxine  150 mcg Intravenous Daily   mometasone-formoterol  2 puff Inhalation BID   Continuous Infusions:  dextrose 5 % and 0.9% NaCl 75 mL/hr at 01/31/20 0010   famotidine (PEPCID) IV 20 mg (01/30/20 2139)     LOS: 2 days    Patient seen and examined on 01/31/2020, progress notes from 01/31/2020 erroneously entered as a discharge summary it is actually a progress note  Roxan Hockey, MD [facility] 01/31/2020, 7:16 PM

## 2020-01-31 NOTE — Progress Notes (Signed)
[facility] Progress Note     Subjective: Had another BM. Ng out and tolerating some clears this afternoon. Wants more. No nausea.   Objective: Vital signs in last 24 hours: Temp:  [98.1 F (36.7 C)-98.2 F (36.8 C)] 98.1 F (36.7 C) (06/30 1419) Pulse Rate:  [59-63] 63 (06/30 0638) Resp:  [20] 20 (06/30 1419) BP: (122-164)/(74-89) 122/89 (06/30 1419) SpO2:  [86 %-97 %] 86 % (06/30 1944) Last BM Date: 01/30/20  Intake/Output from previous day: 06/29 0701 - 06/30 0700 In: 2089.6 [I.V.:2039.6; IV Piggyback:50] Out: 4101 [Urine:2400; Emesis/NG output:1700; Stool:1] Intake/Output this shift: No intake/output data recorded.  General appearance: alert, cooperative and no distress Resp: normal work of breathing GI: soft, nondistended, obese, minimally tender  Lab Results:  Recent Labs    01/31/20 0500  WBC 6.7  HGB 14.7  HCT 47.3  PLT 131*   BMET Recent Labs    01/29/20 0456 01/31/20 0500  NA 140 143  K 4.5 3.8  CL 106 112*  CO2 24 20*  GLUCOSE 93 87  BUN 31* 39*  CREATININE 2.36* 2.39*  CALCIUM 8.5* 8.4*   PT/INR No results for input(s): LABPROT, INR in the last 72 hours.  Studies/Results: DG Abd 1 View  Result Date: 01/31/2020 CLINICAL DATA:  Evaluate for NG tube placement. EXAM: ABDOMEN - 1 VIEW COMPARISON:  01/30/2020 FINDINGS: Previously noted nasogastric tube has been removed. Cholecystectomy clips noted. Persistent dilated loops of small bowel identified. Enteric contrast material is identified within the colon up to the level of the sigmoid colon. IMPRESSION: 1. Interval removal of NG tube. 2. Persistent dilated loops of small bowel compatible with small bowel obstruction. 3. Enteric contrast material identified within the colon up to the sigmoid colon. Electronically Signed   By: Kerby Moors M.D.   On: 01/31/2020 09:53   DG Abd Portable 1V-Small Bowel Obstruction Protocol-initial, 8 hr delay  Result Date: 01/30/2020 CLINICAL DATA:   Small bowel obstruction EXAM: PORTABLE ABDOMEN - 1 VIEW COMPARISON:  01/30/2020 FINDINGS: NG tube is in place with the tip in the stomach. Prior cholecystectomy. Continued dilated small bowel loops. Gas seen within the colon. Findings compatible with partial small bowel obstruction. Overall no real change. IMPRESSION: Overall no significant change in the small bowel obstruction pattern. Electronically Signed   By: Rolm Baptise M.D.   On: 01/30/2020 19:21   DG Abd Portable 1V-Small Bowel Protocol-Position Verification  Result Date: 01/30/2020 CLINICAL DATA:  Small-bowel obstruction, NG tube placement EXAM: PORTABLE ABDOMEN - 1 VIEW COMPARISON:  One day prior FINDINGS: Enteric tube is within the stomach inclusive of side port. Dilated loops of small bowel again identified. IMPRESSION: Enteric tube and side port within the stomach. Electronically Signed   By: Macy Mis M.D.   On: 01/30/2020 09:28    Anti-infectives: Anti-infectives (From admission, onward)   None      Assessment/Plan: Patient with resolving SBO. Doing fair. Full liquid diet Adv diet as tolerated    LOS: 2 days    Virl Cagey 01/31/2020

## 2020-01-31 NOTE — Care Management Important Message (Signed)
Important Message  Patient Details  Name: [name] [name] MRN: [id number] Date of Birth: 07/05/1946   Medicare Important Message Given:  Yes     Tommy Medal 01/31/2020, 3:14 PM

## 2020-02-01 LAB — GLUCOSE, CAPILLARY
Glucose-Capillary: 117 mg/dL — ABNORMAL HIGH (ref 70–99)
Glucose-Capillary: 145 mg/dL — ABNORMAL HIGH (ref 70–99)
Glucose-Capillary: 149 mg/dL — ABNORMAL HIGH (ref 70–99)
Glucose-Capillary: 99 mg/dL (ref 70–99)

## 2020-02-01 MED ORDER — SENNOSIDES-DOCUSATE SODIUM 8.6-50 MG PO TABS
2.0000 | ORAL_TABLET | Freq: Every day | ORAL | 1 refills | Status: AC
Start: 2020-02-01 — End: 2020-04-01

## 2020-02-01 MED ORDER — ASPIRIN EC 81 MG PO TBEC: 81.0000 mg | DELAYED_RELEASE_TABLET | Freq: Every day | ORAL | 11 refills | Status: DC

## 2020-02-01 MED ORDER — HYDRALAZINE HCL 20 MG/ML IJ SOLN: 10.0000 mg | Freq: Once | INTRAMUSCULAR | Status: DC

## 2020-02-01 MED ORDER — ONDANSETRON 4 MG PO TBDP: 4.0000 mg | ORAL_TABLET | Freq: Three times a day (TID) | ORAL | 0 refills | Status: DC | PRN

## 2020-02-01 MED ORDER — ACETAMINOPHEN 325 MG PO TABS: 650.0000 mg | ORAL_TABLET | Freq: Four times a day (QID) | ORAL | 1 refills | Status: AC | PRN

## 2020-02-01 NOTE — Discharge Instructions (Signed)
Soft diet advised, okay to drink plenty fluids, avoid bread and other hard to digest food items

## 2020-02-01 NOTE — Discharge Summary (Signed)
[name] [name], is a 74 y.o. male  DOB Dec 25, 1945  MRN [id number].  Admission date:  01/28/2020  Admitting Physician  Bethena Roys, MD  Discharge Date:  02/01/2020   Primary MD  [name]  Recommendations for primary care physician for things to follow:   Soft diet advised, okay to drink plenty fluids, avoid bread and other hard to digest food items  Admission Diagnosis  Small bowel obstruction ([place]) [K56.609] SBO (small bowel obstruction) ([place]) [K56.609]   Discharge Diagnosis  Small bowel obstruction ([place]) [K56.609] SBO (small bowel obstruction) ([place]) [K56.609]    Active Problems:   SBO (small bowel obstruction) ([place])   Small bowel obstruction ([place])      Past Medical History:  Diagnosis Date  . CAD (coronary artery disease)    DES to LAD June 2018  . CKD (chronic kidney disease) stage 3, GFR 30-59 ml/min   . Essential hypertension   . History of pneumonia   . Hyperlipidemia   . Hypothyroidism   . LBBB (left bundle branch block)   . Morbid obesity ([place])   . Non Hodgkin's lymphoma ([place])    Status post XRT and chemotherapy  . NSTEMI (non-ST elevated myocardial infarction) [facility])    June 2018  . Peripheral neuropathy   . Sleep apnea   . Type 2 diabetes mellitus ([place])     Past Surgical History:  Procedure Laterality Date  . CHOLECYSTECTOMY  1992  . COLONOSCOPY N/A 09/03/2014   SLF:six colon polyps removed/small internal hemorrhoids  . CORONARY STENT INTERVENTION N/A 01/21/2017   Procedure: Coronary Stent Intervention;  Surgeon: Nelva Bush, MD;  Location: [place] [facility];  Service: Cardiovascular;  Laterality: N/A;  . ESOPHAGOGASTRODUODENOSCOPY N/A 09/03/2014   SLF: mild gastritis/few gastric polyps  . LEFT HEART CATH AND CORONARY ANGIOGRAPHY N/A 01/20/2017   Procedure: Left Heart Cath and Coronary Angiography;  Surgeon: Jettie Booze, MD;   Location: [place] [facility];  Service: Cardiovascular;  Laterality: N/A;  . TOTAL KNEE ARTHROPLASTY  11/10/2011   Procedure: TOTAL KNEE ARTHROPLASTY;  Surgeon: Mauri Pole, MD;  Location: [facility];  Service: Orthopedics;  Laterality: Right;  . TOTAL KNEE ARTHROPLASTY Left 05/24/2018   Procedure: LEFT TOTAL KNEE ARTHROPLASTY;  Surgeon: Melrose Nakayama, MD;  Location: [place];  Service: Orthopedics;  Laterality: Left;     HPI  from the history and physical done on the day of admission:     Chief Complaint: Abdominal Pain, vomiting  HPI: [name] [name] is a 74 y.o. male with medical history significant for coronary artery disease, non-Hodgkin's lymphoma, diabetes mellitus. Patient presented to the ED with complaints of abdominal pain, and vomiting that started last night.  Patient's last bowel movement was today-normal in consistency and amount.  Vomitus was nonbloody and nonbilious. No pain with urination.  No chest pain, no difficulty breathing.  ED Course: Stable vitals.  Creatinine 2.6.  Normal lipase 23.  WBC 9.2.  Abdominal CT scan-mid small bowel obstruction with questionable transition  zone in the left mid abdomen. EDP talked with general surgeon on-call Dr. Arnoldo Morale recommended admission under hospitalist service.     Hospital Course:     Brief Narrative:  74 y.o.malewith medical history significant forcoronary artery disease, non-Hodgkin's lymphoma, diabetes mellitus. Patient presented to the ED with complaints of abdominal pain, and vomiting that started last night. Patient's last bowel movement was today-normal in consistency and amount. Vomitus was nonbloody and nonbilious. No pain with urination. No chest pain, no difficulty breathing.  ED Course:Stable vitals. Creatinine 2.6. Normal lipase 23. WBC 9.2. Abdominal CT scan-mid small bowel obstruction with questionable transition zone in the left mid abdomen. EDPtalked with general surgeon on-call Dr. Arnoldo Morale  recommended admission under hospitalist service.  Assessment & Plan: 1-SBO (small bowel obstruction) (HCC) -Presumed to be secondary to adhesions General surgery consult appreciated they recommended conservative approach, NG tube, IV fluids, electrolyte repletion and as needed analgesics -Subsequently with clinical improvement NG tube was removed, patient was passing gas had BM and tolerating oral intake and was discharged home  2-acute kidney injury on chronic kidney disease stage IV -At baseline creatinine 2.2 -Most likely in the setting of poor oral intake and continue use of nephrotoxic agents -Creatinine down to 2.39 from a peak of 2.63 this admission y.  3-hypothyroidism -Continue Synthroid   4-hypertension -Stable okay to resume home regimen  5)Type 2 diabetes with nephropathy -Okay to resume home regimen SINCE  oral intake is now more reliable  6-combined systolic and diastolic heart failure -Appears stable and compensated -Continue to follow daily weights and strict I's and O's -Most recent echo in April 2021 demonstrated ejection fraction 45 to 50% with grade 2 diastolic dysfunction. -Okay to resume bisoprolol, Imdur, losartan and Lasix   7-history of lymphoma- -status post chemotherapy -Continue follow-up surveillance with oncology as an outpatient.  8-chronic respiratory failure/COPD -Continue 2 L supplementation as chronically follow-up as an outpatient -Continue as needed nebulizers   Code Status: Full code Disposition: --Home    Consultants:   General surgery   Procedures:  See below for x-ray reports  Discharge Condition: *Stable, tolerating oral intake, passing gas and having BMs  Follow UP--- PCP  Diet and Activity recommendation:  As advised  Discharge Instructions    Discharge Instructions    Call MD for:  difficulty breathing, headache or visual disturbances   Complete by: As directed    Call MD for:  extreme fatigue    Complete by: As directed    Call MD for:  persistant dizziness or light-headedness   Complete by: As directed    Call MD for:  persistant nausea and vomiting   Complete by: As directed    Call MD for:  severe uncontrolled pain   Complete by: As directed    Call MD for:  temperature >100.4   Complete by: As directed    Diet - low sodium heart healthy   Complete by: As directed    Discharge instructions   Complete by: As directed    Soft diet advised, okay to drink plenty fluids, avoid bread and other hard to digest food items   Increase activity slowly   Complete by: As directed         Discharge Medications     Allergies as of 02/01/2020   No Known Allergies     Medication List    TAKE these medications   acetaminophen 325 MG tablet Commonly known as: TYLENOL Take 2 tablets (650 mg total) by mouth every  6 (six) hours as needed for mild pain (or Fever >/= 101).   albuterol 108 (90 Base) MCG/ACT inhaler Commonly known as: VENTOLIN HFA Inhale 1-2 puffs into the lungs every 6 (six) hours as needed for wheezing or shortness of breath.   amLODipine 10 MG tablet Commonly known as: NORVASC Take 10 mg by mouth daily.   aspirin EC 81 MG tablet Take 1 tablet (81 mg total) by mouth daily with breakfast. Swallow whole. What changed: when to take this   bisoprolol 5 MG tablet Commonly known as: ZEBETA Take 2.5 mg by mouth daily.   fluticasone 50 MCG/ACT nasal spray Commonly known as: FLONASE Place 2 sprays into both nostrils daily for 14 days. What changed:   when to take this  reasons to take this   furosemide 40 MG tablet Commonly known as: LASIX Take 40 mg by mouth daily.   insulin degludec 100 UNIT/ML FlexTouch Pen Commonly known as: Tyler Aas FlexTouch Inject 0.5 mLs (50 Units total) into the skin daily at 10 pm. What changed: how much to take   isosorbide mononitrate 30 MG 24 hr tablet Commonly known as: IMDUR Take 1 tablet (30 mg total) by mouth daily.     levocetirizine 5 MG tablet Commonly known as: XYZAL Take 5 mg by mouth every evening.   levothyroxine 200 MCG tablet Commonly known as: SYNTHROID Take 300 mcg by mouth daily before breakfast. (takes with 147mcg for a total of 326mcg)   levothyroxine 100 MCG tablet Commonly known as: SYNTHROID Take 100 mcg by mouth daily. (takes with 277mcg for a total of 324mcg)   losartan 100 MG tablet Commonly known as: COZAAR Take 100 mg by mouth daily.   nitroGLYCERIN 0.4 MG SL tablet Commonly known as: NITROSTAT Place 0.4 mg under the tongue every 5 (five) minutes as needed for chest pain.   ondansetron 4 MG disintegrating tablet Commonly known as: Zofran ODT Take 1 tablet (4 mg total) by mouth every 8 (eight) hours as needed for nausea or vomiting.   senna-docusate 8.6-50 MG tablet Commonly known as: Senokot-S Take 2 tablets by mouth at bedtime.   simvastatin 5 MG tablet Commonly known as: ZOCOR Take 5 mg by mouth daily.   Symbicort 160-4.5 MCG/ACT inhaler Generic drug: budesonide-formoterol Inhale 2 puffs into the lungs daily.       Major procedures and Radiology Reports - PLEASE review detailed and final reports for all details, in brief -    CT ABDOMEN PELVIS WO CONTRAST  Result Date: 01/28/2020 CLINICAL DATA:  Generalized abdominal pain with nausea and vomiting since yesterday question bowel obstruction; history coronary artery disease post MI, chronic kidney disease, hypertension, non-Hodgkin's lymphoma, type II diabetes mellitus, prior cholecystectomy EXAM: CT ABDOMEN AND PELVIS WITHOUT CONTRAST TECHNIQUE: Multidetector CT imaging of the abdomen and pelvis was performed following the standard protocol without IV contrast. Sagittal and coronal MPR images reconstructed from axial data set. IV contrast could not be administered due to renal dysfunction. No oral contrast administered. COMPARISON:  08/01/2019 FINDINGS: Lower chest: Bibasilar atelectasis.  Small LEFT pleural  effusion. Hepatobiliary: Post cholecystectomy. Pneumobilia again identified present since at least PET-CT from 2017. No focal hepatic mass Pancreas: Normal appearance Spleen: Appears prominent in size 6.4 x 18.2 x 16.4 cm (volume = 1000 cm^3). No focal lesion. Adrenals/Urinary Tract: Adrenal glands, kidneys, and ureters normal appearance. Mild LEFT lateral bladder wall thickening, appears chronic with mild associated tenting. Stomach/Bowel: Normal appendix. Distended stomach. Dilated proximal small bowel loops with decompressed distal small  bowel loops consistent with small bowel obstruction. Transition zone appears to be in the mid small bowel questionably in the LEFT mid abdomen coronal image 26. No evidence of perforation or abscess. No significant wall thickening. Scattered mild mesenteric edema. Colon decompressed. Vascular/Lymphatic: Atherosclerotic calcifications aorta and iliac arteries without aneurysm. No adenopathy. Reproductive: Unremarkable prostate gland and seminal vesicles Other: Calcific density inferior to the xiphoid process with adjacent fluid unchanged from prior studies. No free air or free fluid. No hernia. Musculoskeletal: Osseous structures unremarkable. IMPRESSION: Mid small bowel obstruction with question transition zone in the LEFT mid abdomen. Splenomegaly. Bibasilar atelectasis and small LEFT pleural effusion. Chronic mild LEFT lateral bladder wall thickening with associated tenting, stable. Again identified pneumobilia suspect prior sphincterotomy. Aortic Atherosclerosis (ICD10-I70.0). Electronically Signed   By: Lavonia Dana M.D.   On: 01/28/2020 17:22   DG Abd 1 View  Result Date: 01/31/2020 CLINICAL DATA:  Evaluate for NG tube placement. EXAM: ABDOMEN - 1 VIEW COMPARISON:  01/30/2020 FINDINGS: Previously noted nasogastric tube has been removed. Cholecystectomy clips noted. Persistent dilated loops of small bowel identified. Enteric contrast material is identified within the colon  up to the level of the sigmoid colon. IMPRESSION: 1. Interval removal of NG tube. 2. Persistent dilated loops of small bowel compatible with small bowel obstruction. 3. Enteric contrast material identified within the colon up to the sigmoid colon. Electronically Signed   By: Kerby Moors M.D.   On: 01/31/2020 09:53   DG Abd 1 View  Result Date: 01/29/2020 CLINICAL DATA:  Small bowel obstruction.  NG tube placement. EXAM: ABDOMEN - 1 VIEW COMPARISON:  CT scan of the abdomen dated 01/28/2020 FINDINGS: NG tube tip is in the fundus of the stomach. The proximal side hole is in the distal esophagus at the gastroesophageal junction. Stomach and colon are not distended. Multiple dilated loops of small bowel as demonstrated on the prior CT scan. Cholecystectomy. IMPRESSION: NG tube tip in the fundus of the stomach. The proximal side hole is in the distal esophagus at the gastroesophageal junction. The tube could be advanced. Electronically Signed   By: Lorriane Shire M.D.   On: 01/29/2020 11:23   DG Abd Portable 1V-Small Bowel Obstruction Protocol-initial, 8 hr delay  Result Date: 01/30/2020 CLINICAL DATA:  Small bowel obstruction EXAM: PORTABLE ABDOMEN - 1 VIEW COMPARISON:  01/30/2020 FINDINGS: NG tube is in place with the tip in the stomach. Prior cholecystectomy. Continued dilated small bowel loops. Gas seen within the colon. Findings compatible with partial small bowel obstruction. Overall no real change. IMPRESSION: Overall no significant change in the small bowel obstruction pattern. Electronically Signed   By: Rolm Baptise M.D.   On: 01/30/2020 19:21   DG Abd Portable 1V-Small Bowel Protocol-Position Verification  Result Date: 01/30/2020 CLINICAL DATA:  Small-bowel obstruction, NG tube placement EXAM: PORTABLE ABDOMEN - 1 VIEW COMPARISON:  One day prior FINDINGS: Enteric tube is within the stomach inclusive of side port. Dilated loops of small bowel again identified. IMPRESSION: Enteric tube and side  port within the stomach. Electronically Signed   By: Macy Mis M.D.   On: 01/30/2020 09:28    Micro Results    Recent Results (from the past 240 hour(s))  SARS Coronavirus 2 by RT PCR (hospital order, performed in [facility] hospital lab) Nasopharyngeal Nasopharyngeal Swab     Status: None   Collection Time: 01/28/20  6:34 PM   Specimen: Nasopharyngeal Swab  Result Value Ref Range Status   SARS Coronavirus 2 NEGATIVE NEGATIVE  Final    Comment: (NOTE) SARS-CoV-2 target nucleic acids are NOT DETECTED.  The SARS-CoV-2 RNA is generally detectable in upper and lower respiratory specimens during the acute phase of infection. The lowest concentration of SARS-CoV-2 viral copies this assay can detect is 250 copies / mL. A negative result does not preclude SARS-CoV-2 infection and should not be used as the sole basis for treatment or other patient management decisions.  A negative result may occur with improper specimen collection / handling, submission of specimen other than nasopharyngeal swab, presence of viral mutation(s) within the areas targeted by this assay, and inadequate number of viral copies (<250 copies / mL). A negative result must be combined with clinical observations, patient history, and epidemiological information.  Fact Sheet for Patients:   StrictlyIdeas.no  Fact Sheet for Healthcare Providers: BankingDealers.co.za  This test is not yet approved or  cleared by the [place] FDA and has been authorized for detection and/or diagnosis of SARS-CoV-2 by FDA under an Emergency Use Authorization (EUA).  This EUA will remain in effect (meaning this test can be used) for the duration of the COVID-19 declaration under Section 564(b)(1) of the Act, 21 U.S.C. section 360bbb-3(b)(1), unless the authorization is terminated or revoked sooner.  Performed at [facility], [address] [place], [place] [address]         Today   Subjective    Radley Barto today has no new complaints - No Nausea, Vomiting or Diarrhea  No fever  Or chills Eating and drinking well,  passing gas and having BMs - Eager to go home            Patient has been seen and examined prior to discharge   Objective   Blood pressure (!) 141/72, pulse (!) 59, temperature 98 F (36.7 C), temperature source Oral, resp. rate 20, height 6\' 1"  (1.854 m), weight (!) 136.3 kg, SpO2 93 %.   Intake/Output Summary (Last 24 hours) at 02/01/2020 1624 Last data filed at 02/01/2020 1500 Gross per 24 hour  Intake 1270 ml  Output 2100 ml  Net -830 ml    Exam Gen:- Awake Alert, no acute distress  HEENT:- [place].AT, No sclera icterus Neck-Supple Neck,No JVD,.  Lungs-  CTAB , good air movement bilaterally  CV- S1, S2 normal, regular Abd-  +ve B.Sounds, Abd Soft, No tenderness,    Extremity/Skin:- No  edema,   good pulses Psych-affect is appropriate, oriented x3 Neuro-no new focal deficits, no tremors    Data Review   CBC w Diff:  Lab Results  Component Value Date   WBC 6.7 01/31/2020   HGB 14.7 01/31/2020   HCT 47.3 01/31/2020   PLT 131 (L) 01/31/2020   LYMPHOPCT 9 01/28/2020   MONOPCT 5 01/28/2020   EOSPCT 1 01/28/2020   BASOPCT 1 01/28/2020    CMP:  Lab Results  Component Value Date   NA 143 01/31/2020   K 3.8 01/31/2020   CL 112 (H) 01/31/2020   CO2 20 (L) 01/31/2020   BUN 39 (H) 01/31/2020   CREATININE 2.39 (H) 01/31/2020   PROT 8.4 (H) 01/28/2020   ALBUMIN 4.6 01/28/2020   BILITOT 1.5 (H) 01/28/2020   ALKPHOS 80 01/28/2020   AST 20 01/28/2020   ALT 16 01/28/2020  .   Total Discharge time is about 33 minutes  Roxan Hockey M.D on 02/01/2020 at 4:24 PM  Go to [email or web address] -  for contact info  [facility] - Office  [phone]

## 2020-02-06 ENCOUNTER — Institutional Professional Consult (permissible substitution): Payer: Medicare Other | Admitting: Pulmonary Disease

## 2020-03-22 ENCOUNTER — Institutional Professional Consult (permissible substitution): Payer: Medicare Other | Admitting: Pulmonary Disease

## 2020-04-03 NOTE — Progress Notes (Signed)
Cardiology Office Note  Date: 04/04/2020   ID: Fread, Kottke 05/28/1946, MRN [id number]  PCP:  [name]  Cardiologist:  Rozann Lesches, MD Electrophysiologist:  None   Chief Complaint  Patient presents with  . Cardiac follow-up    History of Present Illness: [name] [name] is a 74 y.o. male last seen in April.  He presents to the office stating that he had a home evaluation by insurance company nurse, was describing bilateral leg pain, and told to be evaluated for possible PAD.  He tells me that he experiences bilateral leg pain from knees down when he stands, not necessarily only while walking, this does not occur in a seated position.  He does use a cane or a walker most of the time.  Otherwise, from a cardiac perspective he describes no angina symptoms, no palpitations or syncope.  Follow-up echocardiogram in April revealed LVEF 45-50% with moderate diastolic dysfunction, mildly reduced RV contraction, mild biatrial enlargement, no major valvular abnormalities.  I reviewed his medications which are outlined below.  He reports compliance with therapy, blood pressure is elevated today and he states that it is in similar range when checked at home.  I personally reviewed his ECG today which shows sinus bradycardia with prolonged PR interval and left bundle branch block.  Past Medical History:  Diagnosis Date  . CAD (coronary artery disease)    DES to LAD June 2018  . CKD (chronic kidney disease) stage 3, GFR 30-59 ml/min   . Essential hypertension   . History of pneumonia   . Hyperlipidemia   . Hypothyroidism   . LBBB (left bundle branch block)   . Morbid obesity ([place])   . Non Hodgkin's lymphoma ([place])    Status post XRT and chemotherapy  . NSTEMI (non-ST elevated myocardial infarction) [facility])    June 2018  . Peripheral neuropathy   . Sleep apnea   . Type 2 diabetes mellitus ([place])     Past Surgical History:  Procedure Laterality Date    . CHOLECYSTECTOMY  1992  . COLONOSCOPY N/A 09/03/2014   SLF:six colon polyps removed/small internal hemorrhoids  . CORONARY STENT INTERVENTION N/A 01/21/2017   Procedure: Coronary Stent Intervention;  Surgeon: Nelva Bush, MD;  Location: [place] [facility];  Service: Cardiovascular;  Laterality: N/A;  . ESOPHAGOGASTRODUODENOSCOPY N/A 09/03/2014   SLF: mild gastritis/few gastric polyps  . LEFT HEART CATH AND CORONARY ANGIOGRAPHY N/A 01/20/2017   Procedure: Left Heart Cath and Coronary Angiography;  Surgeon: Jettie Booze, MD;  Location: [place] [facility];  Service: Cardiovascular;  Laterality: N/A;  . TOTAL KNEE ARTHROPLASTY  11/10/2011   Procedure: TOTAL KNEE ARTHROPLASTY;  Surgeon: Mauri Pole, MD;  Location: [facility];  Service: Orthopedics;  Laterality: Right;  . TOTAL KNEE ARTHROPLASTY Left 05/24/2018   Procedure: LEFT TOTAL KNEE ARTHROPLASTY;  Surgeon: Melrose Nakayama, MD;  Location: [facility];  Service: Orthopedics;  Laterality: Left;    Current Outpatient Medications  Medication Sig Dispense Refill  . acetaminophen (TYLENOL) 325 MG tablet Take 2 tablets (650 mg total) by mouth every 6 (six) hours as needed for mild pain (or Fever >/= 101). 30 tablet 1  . albuterol (VENTOLIN HFA) 108 (90 Base) MCG/ACT inhaler Inhale 1-2 puffs into the lungs every 6 (six) hours as needed for wheezing or shortness of breath. 1 Inhaler 0  . amLODipine (NORVASC) 10 MG tablet Take 10 mg by mouth daily.    Marland Kitchen aspirin EC 81  MG tablet Take 1 tablet (81 mg total) by mouth daily with breakfast. Swallow whole. 30 tablet 11  . bisoprolol (ZEBETA) 5 MG tablet Take 2.5 mg by mouth daily.    . fluticasone (FLONASE) 50 MCG/ACT nasal spray Place 2 sprays into both nostrils daily for 14 days. (Patient taking differently: Place 2 sprays into both nostrils as needed. ) 1 g 0  . furosemide (LASIX) 40 MG tablet Take 40 mg by mouth daily.    . insulin degludec (TRESIBA FLEXTOUCH) 100 UNIT/ML SOPN FlexTouch Pen Inject 0.5  mLs (50 Units total) into the skin daily at 10 pm. (Patient taking differently: Inject 60 Units into the skin daily at 10 pm. )    . isosorbide mononitrate (IMDUR) 30 MG 24 hr tablet Take 1 tablet (30 mg total) by mouth daily. 30 tablet 1  . levocetirizine (XYZAL) 5 MG tablet Take 5 mg by mouth every evening.     Marland Kitchen levothyroxine (SYNTHROID) 100 MCG tablet Take 100 mcg by mouth daily. (takes with 26mcg for a total of 33mcg)    . levothyroxine (SYNTHROID, LEVOTHROID) 200 MCG tablet Take 300 mcg by mouth daily before breakfast. (takes with 139mcg for a total of 32mcg)    . losartan (COZAAR) 100 MG tablet Take 100 mg by mouth daily.    . nitroGLYCERIN (NITROSTAT) 0.4 MG SL tablet Place 0.4 mg under the tongue every 5 (five) minutes as needed for chest pain.   11  . ondansetron (ZOFRAN ODT) 4 MG disintegrating tablet Take 1 tablet (4 mg total) by mouth every 8 (eight) hours as needed for nausea or vomiting. 12 tablet 0  . simvastatin (ZOCOR) 5 MG tablet Take 5 mg by mouth daily.    . SYMBICORT 160-4.5 MCG/ACT inhaler Inhale 2 puffs into the lungs daily.    . hydrALAZINE (APRESOLINE) 25 MG tablet Take 1 tablet (25 mg total) by mouth 2 (two) times daily. 180 tablet 1   No current facility-administered medications for this visit.   Allergies:  Patient has no known allergies.   ROS:   No syncope.  Physical Exam: VS:  BP (!) 174/60   Pulse (!) 57   Ht 6\' 1"  (1.854 m)   Wt (!) 303 lb (137.4 kg)   SpO2 94%   BMI 39.98 kg/m , BMI Body mass index is 39.98 kg/m.  Wt Readings from Last 3 Encounters:  04/04/20 (!) 303 lb (137.4 kg)  01/28/20 (!) 300 lb 7.8 oz (136.3 kg)  11/15/19 (!) 302 lb (137 kg)    General: Morbidly obese male, using a walker. HEENT: Conjunctiva and lids normal, wearing a mask. Neck: Supple, no elevated JVP or carotid bruits, no thyromegaly. Lungs: Clear to auscultation, nonlabored breathing at rest. Cardiac: Regular rate and rhythm, no S3, soft systolic  murmur. Extremities: Bilateral lower leg edema with venous stasis, few skin excoriations, difficult to palpate distal pulses.  ECG:  An ECG dated 01/10/2019 was personally reviewed today and demonstrated:  Sinus bradycardia with prolonged PR interval and left bundle branch block.  Recent Labwork: 01/28/2020: ALT 16; AST 20; TSH 1.738 01/31/2020: BUN 39; Creatinine, Ser 2.39; Hemoglobin 14.7; Platelets 131; Potassium 3.8; Sodium 143     Component Value Date/Time   CHOL 116 01/20/2017 1130   TRIG 174 (H) 01/20/2017 1130   HDL 31 (L) 01/20/2017 1130   CHOLHDL 3.7 01/20/2017 1130   VLDL 35 01/20/2017 1130   LDLCALC 50 01/20/2017 1130  January 2021: Cholesterol 131, triglycerides 168, HDL 29, LDL  87 July 2021: Hemoglobin 15.7, platelets 153, potassium 4.5, BUN 29, creatinine 2.74   Other Studies Reviewed Today:  Echocardiogram 11/17/2019: 1. Left ventricular ejection fraction, by estimation, is 45 to 50%. The  left ventricle has mildly decreased function. The left ventricle  demonstrates global hypokinesis. The left ventricular internal cavity size  was severely dilated. There is moderate  left ventricular hypertrophy of the septal segment. Left ventricular  diastolic parameters are consistent with Grade II diastolic dysfunction  (pseudonormalization). Elevated left ventricular end-diastolic pressure.  2. Right ventricular systolic function is mildly reduced. The right  ventricular size is mildly enlarged. There is normal pulmonary artery  systolic pressure.  3. Left atrial size was mildly dilated.  4. Right atrial size was mildly dilated.  5. The mitral valve is grossly normal. No evidence of mitral valve  regurgitation.  6. The aortic valve was not well visualized. Aortic valve regurgitation  is trivial.  7. The inferior vena cava is normal in size with greater than 50%  respiratory variability, suggesting right atrial pressure of 3 mmHg.   Assessment and Plan:  1.   Bilateral leg pain as outlined above, could be neuropathic or related to spinal stenosis based on description, but PAD not excluded.  Plan will be to obtain lower extremity Dopplers and ABIs.  2.  CAD status post DES to the LAD in June 2018, known occlusion of the obtuse marginal and moderate ramus intermedius stenosis that are managed medically.  He describes no active angina at this time.  ECG reviewed and stable.  Continue aspirin, Norvasc, Zebeta, losartan, and Zocor.  3.  Essential hypertension, not optimally controlled.  Continue Norvasc, Zebeta, losartan, and hydralazine beginning at 25 mg twice daily.  4.  CKD stage IIIb, recent creatinine 2.74.  Medication Adjustments/Labs and Tests Ordered: Current medicines are reviewed at length with the patient today.  Concerns regarding medicines are outlined above.   Tests Ordered: Orders Placed This Encounter  Procedures  . EKG 12-Lead  . VAS [place] LOWER EXT ART SEG MULTI (SEGMENTALS & LE RAYNAUDS)    Medication Changes: Meds ordered this encounter  Medications  . hydrALAZINE (APRESOLINE) 25 MG tablet    Sig: Take 1 tablet (25 mg total) by mouth 2 (two) times daily.    Dispense:  180 tablet    Refill:  1    04/04/2020 NEW    Disposition:  Follow up 6 months in the [place] office.  Signed, Satira Sark, MD, [facility] 04/04/2020 9:06 AM    [facility] [place], [place], [place] [address] Phone: [phone]; Fax: [phone]

## 2020-04-04 ENCOUNTER — Encounter: Payer: Self-pay | Admitting: Cardiology

## 2020-04-04 ENCOUNTER — Other Ambulatory Visit: Payer: Self-pay

## 2020-04-04 ENCOUNTER — Ambulatory Visit: Payer: Medicare HMO | Admitting: Cardiology

## 2020-04-04 VITALS — BP 174/60 | HR 57 | Ht 73.0 in | Wt 303.0 lb

## 2020-04-04 DIAGNOSIS — I25119 Atherosclerotic heart disease of native coronary artery with unspecified angina pectoris: Secondary | ICD-10-CM | POA: Diagnosis not present

## 2020-04-04 DIAGNOSIS — M79605 Pain in left leg: Secondary | ICD-10-CM

## 2020-04-04 DIAGNOSIS — E782 Mixed hyperlipidemia: Secondary | ICD-10-CM

## 2020-04-04 DIAGNOSIS — M79604 Pain in right leg: Secondary | ICD-10-CM

## 2020-04-04 DIAGNOSIS — N1832 Chronic kidney disease, stage 3b: Secondary | ICD-10-CM

## 2020-04-04 MED ORDER — HYDRALAZINE HCL 25 MG PO TABS
25.0000 mg | ORAL_TABLET | Freq: Two times a day (BID) | ORAL | 1 refills | Status: DC
Start: 2020-04-04 — End: 2020-08-10

## 2020-04-04 NOTE — Patient Instructions (Addendum)
Medication Instructions:   Your physician has recommended you make the following change in your medication:   Start hydralazine 25 mg by mouth twice daily  Continue other medications the same  Labwork:  None  Testing/Procedures:  Your physician has requested that you have a lower extremity arterial exercise duplex. During this test, exercise and ultrasound are used to evaluate arterial blood flow in the legs. Allow one hour for this exam. There are no restrictions or special instructions. Your physician has requested that you have an ankle brachial index (ABI). During this test an ultrasound and blood pressure cuff are used to evaluate the arteries that supply the arms and legs with blood. Allow thirty minutes for this exam. There are no restrictions or special instructions.  Follow-Up:  Your physician recommends that you schedule a follow-up appointment in: 6 months.  Any Other Special Instructions Will Be Listed Below (If Applicable).  If you need a refill on your cardiac medications before your next appointment, please call your pharmacy.

## 2020-04-10 ENCOUNTER — Other Ambulatory Visit: Payer: Self-pay | Admitting: Cardiology

## 2020-04-10 DIAGNOSIS — M79605 Pain in left leg: Secondary | ICD-10-CM

## 2020-04-17 ENCOUNTER — Ambulatory Visit (INDEPENDENT_AMBULATORY_CARE_PROVIDER_SITE_OTHER): Payer: Medicare HMO

## 2020-04-17 ENCOUNTER — Other Ambulatory Visit: Payer: Self-pay | Admitting: Cardiology

## 2020-04-17 ENCOUNTER — Other Ambulatory Visit: Payer: Self-pay

## 2020-04-17 DIAGNOSIS — I739 Peripheral vascular disease, unspecified: Secondary | ICD-10-CM

## 2020-04-22 ENCOUNTER — Telehealth: Payer: Self-pay | Admitting: *Deleted

## 2020-04-22 NOTE — Telephone Encounter (Signed)
-----   Message from Satira Sark, MD sent at 04/21/2020  2:58 PM EDT ----- Results reviewed.  Preliminary report awaiting final read shows normal ABIs.  This argues against obstructive PAD as cause of leg pain, more likely neuropathic in nature.

## 2020-04-22 NOTE — Telephone Encounter (Signed)
-----   Message from Satira Sark, MD sent at 04/22/2020  9:03 AM EDT ----- Results reviewed.  No change in final report.

## 2020-04-22 NOTE — Telephone Encounter (Signed)
Patient informed. Copy sent to PCP °

## 2020-05-22 ENCOUNTER — Ambulatory Visit: Payer: Medicare Other | Admitting: Cardiology

## 2020-06-24 ENCOUNTER — Other Ambulatory Visit: Payer: Self-pay

## 2020-06-24 ENCOUNTER — Encounter (HOSPITAL_COMMUNITY): Payer: Self-pay | Admitting: Emergency Medicine

## 2020-06-24 ENCOUNTER — Inpatient Hospital Stay (HOSPITAL_COMMUNITY)
Admission: EM | Admit: 2020-06-24 | Discharge: 2020-06-29 | DRG: 177 | Discharge status: Against Medical Advice | Payer: Medicare HMO | Attending: Internal Medicine | Admitting: Internal Medicine

## 2020-06-24 ENCOUNTER — Emergency Department (HOSPITAL_COMMUNITY): Payer: Medicare HMO

## 2020-06-24 DIAGNOSIS — Z923 Personal history of irradiation: Secondary | ICD-10-CM

## 2020-06-24 DIAGNOSIS — I252 Old myocardial infarction: Secondary | ICD-10-CM

## 2020-06-24 DIAGNOSIS — E785 Hyperlipidemia, unspecified: Secondary | ICD-10-CM | POA: Diagnosis present

## 2020-06-24 DIAGNOSIS — E860 Dehydration: Secondary | ICD-10-CM | POA: Diagnosis present

## 2020-06-24 DIAGNOSIS — J690 Pneumonitis due to inhalation of food and vomit: Secondary | ICD-10-CM | POA: Diagnosis present

## 2020-06-24 DIAGNOSIS — R001 Bradycardia, unspecified: Secondary | ICD-10-CM | POA: Diagnosis not present

## 2020-06-24 DIAGNOSIS — Z9049 Acquired absence of other specified parts of digestive tract: Secondary | ICD-10-CM

## 2020-06-24 DIAGNOSIS — I10 Essential (primary) hypertension: Secondary | ICD-10-CM

## 2020-06-24 DIAGNOSIS — E038 Other specified hypothyroidism: Secondary | ICD-10-CM

## 2020-06-24 DIAGNOSIS — R0902 Hypoxemia: Secondary | ICD-10-CM

## 2020-06-24 DIAGNOSIS — T380X5A Adverse effect of glucocorticoids and synthetic analogues, initial encounter: Secondary | ICD-10-CM | POA: Diagnosis not present

## 2020-06-24 DIAGNOSIS — Z794 Long term (current) use of insulin: Secondary | ICD-10-CM

## 2020-06-24 DIAGNOSIS — Z7982 Long term (current) use of aspirin: Secondary | ICD-10-CM

## 2020-06-24 DIAGNOSIS — N179 Acute kidney failure, unspecified: Secondary | ICD-10-CM | POA: Diagnosis present

## 2020-06-24 DIAGNOSIS — R410 Disorientation, unspecified: Secondary | ICD-10-CM

## 2020-06-24 DIAGNOSIS — J1282 Pneumonia due to coronavirus disease 2019: Secondary | ICD-10-CM | POA: Diagnosis present

## 2020-06-24 DIAGNOSIS — E039 Hypothyroidism, unspecified: Secondary | ICD-10-CM | POA: Diagnosis present

## 2020-06-24 DIAGNOSIS — D6959 Other secondary thrombocytopenia: Secondary | ICD-10-CM | POA: Diagnosis present

## 2020-06-24 DIAGNOSIS — G4730 Sleep apnea, unspecified: Secondary | ICD-10-CM | POA: Diagnosis present

## 2020-06-24 DIAGNOSIS — J9621 Acute and chronic respiratory failure with hypoxia: Secondary | ICD-10-CM | POA: Diagnosis present

## 2020-06-24 DIAGNOSIS — E1142 Type 2 diabetes mellitus with diabetic polyneuropathy: Secondary | ICD-10-CM | POA: Diagnosis present

## 2020-06-24 DIAGNOSIS — U071 COVID-19: Secondary | ICD-10-CM | POA: Diagnosis not present

## 2020-06-24 DIAGNOSIS — E876 Hypokalemia: Secondary | ICD-10-CM | POA: Diagnosis not present

## 2020-06-24 DIAGNOSIS — N189 Chronic kidney disease, unspecified: Secondary | ICD-10-CM | POA: Diagnosis present

## 2020-06-24 DIAGNOSIS — G9341 Metabolic encephalopathy: Secondary | ICD-10-CM | POA: Diagnosis present

## 2020-06-24 DIAGNOSIS — N184 Chronic kidney disease, stage 4 (severe): Secondary | ICD-10-CM | POA: Diagnosis present

## 2020-06-24 DIAGNOSIS — Z8572 Personal history of non-Hodgkin lymphomas: Secondary | ICD-10-CM

## 2020-06-24 DIAGNOSIS — E1122 Type 2 diabetes mellitus with diabetic chronic kidney disease: Secondary | ICD-10-CM | POA: Diagnosis present

## 2020-06-24 DIAGNOSIS — F17210 Nicotine dependence, cigarettes, uncomplicated: Secondary | ICD-10-CM | POA: Diagnosis present

## 2020-06-24 DIAGNOSIS — Z79899 Other long term (current) drug therapy: Secondary | ICD-10-CM

## 2020-06-24 DIAGNOSIS — J9622 Acute and chronic respiratory failure with hypercapnia: Secondary | ICD-10-CM | POA: Diagnosis present

## 2020-06-24 DIAGNOSIS — I2510 Atherosclerotic heart disease of native coronary artery without angina pectoris: Secondary | ICD-10-CM | POA: Diagnosis present

## 2020-06-24 DIAGNOSIS — Z79890 Hormone replacement therapy (postmenopausal): Secondary | ICD-10-CM

## 2020-06-24 DIAGNOSIS — I1310 Hypertensive heart and chronic kidney disease without heart failure, with stage 1 through stage 4 chronic kidney disease, or unspecified chronic kidney disease: Secondary | ICD-10-CM | POA: Diagnosis present

## 2020-06-24 DIAGNOSIS — Z7951 Long term (current) use of inhaled steroids: Secondary | ICD-10-CM

## 2020-06-24 DIAGNOSIS — Z96653 Presence of artificial knee joint, bilateral: Secondary | ICD-10-CM | POA: Diagnosis present

## 2020-06-24 DIAGNOSIS — G4733 Obstructive sleep apnea (adult) (pediatric): Secondary | ICD-10-CM | POA: Diagnosis present

## 2020-06-24 LAB — COMPREHENSIVE METABOLIC PANEL
ALT: 25 U/L (ref 0–44)
AST: 40 U/L (ref 15–41)
Albumin: 4 g/dL (ref 3.5–5.0)
Alkaline Phosphatase: 75 U/L (ref 38–126)
Anion gap: 10 (ref 5–15)
BUN: 41 mg/dL — ABNORMAL HIGH (ref 8–23)
CO2: 21 mmol/L — ABNORMAL LOW (ref 22–32)
Calcium: 8.4 mg/dL — ABNORMAL LOW (ref 8.9–10.3)
Chloride: 105 mmol/L (ref 98–111)
Creatinine, Ser: 2.89 mg/dL — ABNORMAL HIGH (ref 0.61–1.24)
GFR, Estimated: 22 mL/min — ABNORMAL LOW (ref >60)
Glucose, Bld: 139 mg/dL — ABNORMAL HIGH (ref 70–99)
Potassium: 3.9 mmol/L (ref 3.5–5.1)
Sodium: 136 mmol/L (ref 135–145)
Total Bilirubin: 1 mg/dL (ref 0.3–1.2)
Total Protein: 7.7 g/dL (ref 6.5–8.1)

## 2020-06-24 LAB — URINALYSIS, ROUTINE W REFLEX MICROSCOPIC
Bacteria, UA: NONE SEEN
Bilirubin Urine: NEGATIVE
Glucose, UA: 50 mg/dL — AB
Hgb urine dipstick: NEGATIVE
Ketones, ur: NEGATIVE mg/dL
Leukocytes,Ua: NEGATIVE
Nitrite: NEGATIVE
Protein, ur: >=300 mg/dL — AB
Specific Gravity, Urine: 1.015 (ref 1.005–1.030)
pH: 7 (ref 5.0–8.0)

## 2020-06-24 LAB — CBC WITH DIFFERENTIAL/PLATELET
Abs Immature Granulocytes: 0.01 10*3/uL (ref 0.00–0.07)
Abs Immature Granulocytes: 0.01 10*3/uL (ref 0.00–0.07)
Basophils Absolute: 0 10*3/uL (ref 0.0–0.1)
Basophils Absolute: 0 10*3/uL (ref 0.0–0.1)
Basophils Relative: 1 %
Basophils Relative: 1 %
Eosinophils Absolute: 0 10*3/uL (ref 0.0–0.5)
Eosinophils Absolute: 0 10*3/uL (ref 0.0–0.5)
Eosinophils Relative: 0 %
Eosinophils Relative: 0 %
HCT: 49.6 % (ref 39.0–52.0)
HCT: 47.6 % (ref 39.0–52.0)
Hemoglobin: 15.7 g/dL (ref 13.0–17.0)
Hemoglobin: 15.3 g/dL (ref 13.0–17.0)
Immature Granulocytes: 0 %
Immature Granulocytes: 0 %
Lymphocytes Relative: 19 %
Lymphocytes Relative: 25 %
Lymphs Abs: 0.8 10*3/uL (ref 0.7–4.0)
Lymphs Abs: 1 10*3/uL (ref 0.7–4.0)
MCH: 29 pg (ref 26.0–34.0)
MCH: 29.5 pg (ref 26.0–34.0)
MCHC: 31.7 g/dL (ref 30.0–36.0)
MCHC: 32.1 g/dL (ref 30.0–36.0)
MCV: 91.5 fL (ref 80.0–100.0)
MCV: 91.7 fL (ref 80.0–100.0)
Monocytes Absolute: 0.5 10*3/uL (ref 0.1–1.0)
Monocytes Absolute: 0.6 10*3/uL (ref 0.1–1.0)
Monocytes Relative: 12 %
Monocytes Relative: 15 %
Neutro Abs: 2.6 10*3/uL (ref 1.7–7.7)
Neutro Abs: 2.4 10*3/uL (ref 1.7–7.7)
Neutrophils Relative %: 68 %
Neutrophils Relative %: 59 %
Platelets: 101 10*3/uL — ABNORMAL LOW (ref 150–400)
Platelets: 89 10*3/uL — ABNORMAL LOW (ref 150–400)
RBC: 5.42 MIL/uL (ref 4.22–5.81)
RBC: 5.19 MIL/uL (ref 4.22–5.81)
RDW: 15.6 % — ABNORMAL HIGH (ref 11.5–15.5)
RDW: 15.5 % (ref 11.5–15.5)
WBC: 3.9 10*3/uL — ABNORMAL LOW (ref 4.0–10.5)
WBC: 4 10*3/uL (ref 4.0–10.5)
nRBC: 0 % (ref 0.0–0.2)
nRBC: 0 % (ref 0.0–0.2)

## 2020-06-24 LAB — BASIC METABOLIC PANEL
Anion gap: 9 (ref 5–15)
BUN: 43 mg/dL — ABNORMAL HIGH (ref 8–23)
CO2: 21 mmol/L — ABNORMAL LOW (ref 22–32)
Calcium: 8 mg/dL — ABNORMAL LOW (ref 8.9–10.3)
Chloride: 107 mmol/L (ref 98–111)
Creatinine, Ser: 2.83 mg/dL — ABNORMAL HIGH (ref 0.61–1.24)
GFR, Estimated: 23 mL/min — ABNORMAL LOW (ref >60)
Glucose, Bld: 95 mg/dL (ref 70–99)
Potassium: 3.6 mmol/L (ref 3.5–5.1)
Sodium: 137 mmol/L (ref 135–145)

## 2020-06-24 LAB — C-REACTIVE PROTEIN: CRP: 11.3 mg/dL — ABNORMAL HIGH (ref <1.0)

## 2020-06-24 LAB — FIBRINOGEN: Fibrinogen: 696 mg/dL — ABNORMAL HIGH (ref 210–475)

## 2020-06-24 LAB — PROCALCITONIN: Procalcitonin: 0.12 ng/mL

## 2020-06-24 LAB — FERRITIN: Ferritin: 495 ng/mL — ABNORMAL HIGH (ref 24–336)

## 2020-06-24 LAB — RESP PANEL BY RT-PCR (FLU A&B, COVID) ARPGX2
Influenza A by PCR: NEGATIVE
Influenza B by PCR: NEGATIVE
SARS Coronavirus 2 by RT PCR: POSITIVE — AB

## 2020-06-24 LAB — LACTIC ACID, PLASMA
Lactic Acid, Venous: 1.7 mmol/L (ref 0.5–1.9)
Lactic Acid, Venous: 1.7 mmol/L (ref 0.5–1.9)

## 2020-06-24 LAB — BRAIN NATRIURETIC PEPTIDE: B Natriuretic Peptide: 157 pg/mL — ABNORMAL HIGH (ref 0.0–100.0)

## 2020-06-24 LAB — GLUCOSE, CAPILLARY
Glucose-Capillary: 87 mg/dL (ref 70–99)
Glucose-Capillary: 137 mg/dL — ABNORMAL HIGH (ref 70–99)
Glucose-Capillary: 194 mg/dL — ABNORMAL HIGH (ref 70–99)
Glucose-Capillary: 174 mg/dL — ABNORMAL HIGH (ref 70–99)

## 2020-06-24 LAB — PHOSPHORUS: Phosphorus: 2.7 mg/dL (ref 2.5–4.6)

## 2020-06-24 LAB — APTT: aPTT: 38 seconds — ABNORMAL HIGH (ref 24–36)

## 2020-06-24 LAB — LACTATE DEHYDROGENASE: LDH: 226 U/L — ABNORMAL HIGH (ref 98–192)

## 2020-06-24 LAB — PROTIME-INR
INR: 1.2 (ref 0.8–1.2)
Prothrombin Time: 14.6 seconds (ref 11.4–15.2)

## 2020-06-24 LAB — D-DIMER, QUANTITATIVE: D-Dimer, Quant: 1.75 ug/mL-FEU — ABNORMAL HIGH (ref 0.00–0.50)

## 2020-06-24 LAB — MAGNESIUM: Magnesium: 2.3 mg/dL (ref 1.7–2.4)

## 2020-06-24 LAB — MRSA PCR SCREENING: MRSA by PCR: NEGATIVE

## 2020-06-24 MED ORDER — ISOSORBIDE MONONITRATE ER 60 MG PO TB24
30.0000 mg | ORAL_TABLET | Freq: Every day | ORAL | Status: DC
  Administered 2020-06-25 – 2020-06-29 (×5): 30 mg via ORAL
  Filled 2020-06-24 (×5): qty 1

## 2020-06-24 MED ORDER — CHLORHEXIDINE GLUCONATE CLOTH 2 % EX PADS
6.0000 | MEDICATED_PAD | Freq: Every day | CUTANEOUS | Status: DC
  Administered 2020-06-24 – 2020-06-29 (×4): 6 via TOPICAL

## 2020-06-24 MED ORDER — INSULIN ASPART 100 UNIT/ML ~~LOC~~ SOLN
0.0000 [IU] | Freq: Three times a day (TID) | SUBCUTANEOUS | Status: DC
  Administered 2020-06-25 (×3): 5 [IU] via SUBCUTANEOUS
  Administered 2020-06-26: 8 [IU] via SUBCUTANEOUS
  Administered 2020-06-26: 3 [IU] via SUBCUTANEOUS
  Administered 2020-06-26: 11 [IU] via SUBCUTANEOUS
  Administered 2020-06-27: 5 [IU] via SUBCUTANEOUS
  Administered 2020-06-27: 8 [IU] via SUBCUTANEOUS
  Administered 2020-06-28: 15 [IU] via SUBCUTANEOUS
  Administered 2020-06-28: 8 [IU] via SUBCUTANEOUS
  Administered 2020-06-28: 5 [IU] via SUBCUTANEOUS
  Administered 2020-06-29: 8 [IU] via SUBCUTANEOUS
  Administered 2020-06-29: 3 [IU] via SUBCUTANEOUS

## 2020-06-24 MED ORDER — ALBUTEROL SULFATE HFA 108 (90 BASE) MCG/ACT IN AERS
2.0000 | INHALATION_SPRAY | RESPIRATORY_TRACT | Status: DC | PRN
  Filled 2020-06-24: qty 6.7

## 2020-06-24 MED ORDER — ALBUTEROL SULFATE (2.5 MG/3ML) 0.083% IN NEBU: 2.5000 mg | INHALATION_SOLUTION | RESPIRATORY_TRACT | Status: DC | PRN

## 2020-06-24 MED ORDER — DEXAMETHASONE SODIUM PHOSPHATE 10 MG/ML IJ SOLN
6.0000 mg | INTRAMUSCULAR | Status: DC
  Administered 2020-06-25 – 2020-06-26 (×2): 6 mg via INTRAVENOUS
  Filled 2020-06-24 (×3): qty 1

## 2020-06-24 MED ORDER — ONDANSETRON HCL 4 MG PO TABS: 4.0000 mg | ORAL_TABLET | Freq: Four times a day (QID) | ORAL | Status: DC | PRN

## 2020-06-24 MED ORDER — ONDANSETRON HCL 4 MG/2ML IJ SOLN: 4.0000 mg | Freq: Four times a day (QID) | INTRAMUSCULAR | Status: DC | PRN

## 2020-06-24 MED ORDER — ZINC SULFATE 220 (50 ZN) MG PO CAPS
220.0000 mg | ORAL_CAPSULE | Freq: Every day | ORAL | Status: DC
  Administered 2020-06-24 – 2020-06-29 (×6): 220 mg via ORAL
  Filled 2020-06-24 (×6): qty 1

## 2020-06-24 MED ORDER — LACTATED RINGERS IV BOLUS
1000.0000 mL | Freq: Once | INTRAVENOUS | Status: AC
  Administered 2020-06-24: 1000 mL via INTRAVENOUS

## 2020-06-24 MED ORDER — INSULIN ASPART 100 UNIT/ML ~~LOC~~ SOLN: 0.0000 [IU] | SUBCUTANEOUS | Status: DC

## 2020-06-24 MED ORDER — LEVOTHYROXINE SODIUM 100 MCG PO TABS
300.0000 ug | ORAL_TABLET | Freq: Every day | ORAL | Status: DC
  Administered 2020-06-25 – 2020-06-29 (×5): 300 ug via ORAL
  Filled 2020-06-24 (×5): qty 3

## 2020-06-24 MED ORDER — IPRATROPIUM-ALBUTEROL 20-100 MCG/ACT IN AERS
1.0000 | INHALATION_SPRAY | Freq: Three times a day (TID) | RESPIRATORY_TRACT | Status: DC
  Administered 2020-06-24 – 2020-06-29 (×16): 1 via RESPIRATORY_TRACT
  Filled 2020-06-24: qty 4

## 2020-06-24 MED ORDER — SODIUM CHLORIDE 0.9 % IV BOLUS
500.0000 mL | Freq: Once | INTRAVENOUS | Status: AC
  Administered 2020-06-24: 500 mL via INTRAVENOUS

## 2020-06-24 MED ORDER — ACETAMINOPHEN 650 MG RE SUPP: 650.0000 mg | Freq: Four times a day (QID) | RECTAL | Status: DC | PRN

## 2020-06-24 MED ORDER — ASPIRIN EC 81 MG PO TBEC
81.0000 mg | DELAYED_RELEASE_TABLET | Freq: Every day | ORAL | Status: DC
  Administered 2020-06-25 – 2020-06-29 (×5): 81 mg via ORAL
  Filled 2020-06-24 (×5): qty 1

## 2020-06-24 MED ORDER — INSULIN DETEMIR 100 UNIT/ML ~~LOC~~ SOLN
10.0000 [IU] | Freq: Two times a day (BID) | SUBCUTANEOUS | Status: DC
  Administered 2020-06-24 – 2020-06-26 (×5): 10 [IU] via SUBCUTANEOUS
  Filled 2020-06-24 (×9): qty 0.1

## 2020-06-24 MED ORDER — NITROGLYCERIN 0.4 MG SL SUBL: 0.4000 mg | SUBLINGUAL_TABLET | SUBLINGUAL | Status: DC | PRN

## 2020-06-24 MED ORDER — SIMVASTATIN 10 MG PO TABS
5.0000 mg | ORAL_TABLET | Freq: Every day | ORAL | Status: DC
  Administered 2020-06-25 – 2020-06-28 (×4): 5 mg via ORAL
  Filled 2020-06-24: qty 1
  Filled 2020-06-24 (×2): qty 0.5
  Filled 2020-06-24 (×2): qty 1

## 2020-06-24 MED ORDER — SODIUM CHLORIDE 0.9 % IV SOLN: INTRAVENOUS | Status: DC

## 2020-06-24 MED ORDER — SODIUM CHLORIDE 0.9 % IV SOLN
100.0000 mg | Freq: Every day | INTRAVENOUS | Status: AC
  Administered 2020-06-25 – 2020-06-28 (×4): 100 mg via INTRAVENOUS
  Filled 2020-06-24 (×5): qty 20

## 2020-06-24 MED ORDER — ACETAMINOPHEN 325 MG PO TABS
650.0000 mg | ORAL_TABLET | Freq: Four times a day (QID) | ORAL | Status: DC | PRN
  Administered 2020-06-26: 650 mg via ORAL
  Filled 2020-06-24: qty 2

## 2020-06-24 MED ORDER — MOMETASONE FURO-FORMOTEROL FUM 200-5 MCG/ACT IN AERO: 2.0000 | INHALATION_SPRAY | Freq: Two times a day (BID) | RESPIRATORY_TRACT | Status: DC

## 2020-06-24 MED ORDER — SODIUM CHLORIDE 0.9 % IV SOLN
100.0000 mg | Freq: Once | INTRAVENOUS | Status: AC
  Administered 2020-06-24: 100 mg via INTRAVENOUS
  Filled 2020-06-24: qty 20

## 2020-06-24 MED ORDER — MOMETASONE FURO-FORMOTEROL FUM 100-5 MCG/ACT IN AERO
2.0000 | INHALATION_SPRAY | Freq: Two times a day (BID) | RESPIRATORY_TRACT | Status: DC
  Administered 2020-06-25 – 2020-06-29 (×9): 2 via RESPIRATORY_TRACT
  Filled 2020-06-24: qty 8.8

## 2020-06-24 MED ORDER — SODIUM CHLORIDE 0.9 % IV SOLN
3.0000 g | Freq: Two times a day (BID) | INTRAVENOUS | Status: AC
  Filled 2020-06-24 (×2): qty 8

## 2020-06-24 MED ORDER — HYDROCOD POLST-CPM POLST ER 10-8 MG/5ML PO SUER
5.0000 mL | Freq: Two times a day (BID) | ORAL | Status: DC | PRN
  Administered 2020-06-28: 5 mL via ORAL
  Filled 2020-06-24: qty 5

## 2020-06-24 MED ORDER — ALBUTEROL SULFATE HFA 108 (90 BASE) MCG/ACT IN AERS: 2.0000 | INHALATION_SPRAY | Freq: Four times a day (QID) | RESPIRATORY_TRACT | Status: DC

## 2020-06-24 MED ORDER — IPRATROPIUM BROMIDE 0.02 % IN SOLN: 0.5000 mg | Freq: Four times a day (QID) | RESPIRATORY_TRACT | Status: DC

## 2020-06-24 MED ORDER — SODIUM CHLORIDE 0.9 % IV SOLN
100.0000 mg | Freq: Once | INTRAVENOUS | Status: AC
  Administered 2020-06-24: 100 mg via INTRAVENOUS

## 2020-06-24 MED ORDER — ALBUTEROL SULFATE (2.5 MG/3ML) 0.083% IN NEBU: 2.5000 mg | INHALATION_SOLUTION | Freq: Four times a day (QID) | RESPIRATORY_TRACT | Status: DC

## 2020-06-24 MED ORDER — INSULIN ASPART 100 UNIT/ML ~~LOC~~ SOLN
0.0000 [IU] | Freq: Three times a day (TID) | SUBCUTANEOUS | Status: DC
  Administered 2020-06-24: 3 [IU] via SUBCUTANEOUS
  Administered 2020-06-24: 4 [IU] via SUBCUTANEOUS

## 2020-06-24 MED ORDER — SODIUM CHLORIDE 0.9 % IV SOLN: 100.0000 mg | Freq: Every day | INTRAVENOUS | Status: DC

## 2020-06-24 MED ORDER — GUAIFENESIN-DM 100-10 MG/5ML PO SYRP
10.0000 mL | ORAL_SOLUTION | ORAL | Status: DC | PRN
  Administered 2020-06-28: 10 mL via ORAL
  Filled 2020-06-24: qty 10

## 2020-06-24 MED ORDER — ASCORBIC ACID 500 MG PO TABS
500.0000 mg | ORAL_TABLET | Freq: Every day | ORAL | Status: DC
  Administered 2020-06-24 – 2020-06-29 (×6): 500 mg via ORAL
  Filled 2020-06-24 (×6): qty 1

## 2020-06-24 MED ORDER — CLINDAMYCIN PHOSPHATE 900 MG/50ML IV SOLN
900.0000 mg | Freq: Once | INTRAVENOUS | Status: AC
  Administered 2020-06-24: 900 mg via INTRAVENOUS
  Filled 2020-06-24: qty 50

## 2020-06-24 MED ORDER — INSULIN DETEMIR 100 UNIT/ML ~~LOC~~ SOLN
0.1000 [IU]/kg | Freq: Two times a day (BID) | SUBCUTANEOUS | Status: DC
  Filled 2020-06-24 (×3): qty 0.14

## 2020-06-24 MED ORDER — SODIUM CHLORIDE 0.9 % IV SOLN: 200.0000 mg | Freq: Once | INTRAVENOUS | Status: DC

## 2020-06-24 MED ORDER — LINAGLIPTIN 5 MG PO TABS
5.0000 mg | ORAL_TABLET | Freq: Every day | ORAL | Status: DC
  Administered 2020-06-24 – 2020-06-29 (×6): 5 mg via ORAL
  Filled 2020-06-24 (×7): qty 1

## 2020-06-24 NOTE — Progress Notes (Signed)
Pharmacy Antibiotic Note  [name] [name] is a 74 y.o. male admitted on 06/24/2020 with pneumonia.  Pharmacy has been consulted for Unasyn dosing. COVID+. Noted renal dysfunction.   Plan: Unasyn 3g IV q12h Trend WBC, temp, renal function  F/U infectious work-up   Height: 6\' 1"  (185.4 cm) Weight: (!) 137.4 kg (302 lb 14.6 oz) IBW/kg (Calculated) : 79.9  Temp (24hrs), Avg:99.5 F (37.5 C), Min:98.3 F (36.8 C), Max:100.2 F (37.9 C)  Recent Labs  Lab 06/24/20 0029 06/24/20 0131  WBC 3.9*  --   CREATININE 2.89*  --   LATICACIDVEN 1.7 1.7    Estimated Creatinine Clearance: 33.1 mL/min (A) (by C-G formula based on SCr of 2.89 mg/dL (H)).    No Known Allergies  Narda Bonds, PharmD, BCPS Clinical Pharmacist Phone: [phone]

## 2020-06-24 NOTE — Progress Notes (Addendum)
ASSUMPTION OF CARE NOTE   06/24/2020 10:02 AM  [name] [name] was seen and examined.  The H&P by the admitting provider, orders, imaging was reviewed.  Please see new orders.  Will continue to follow.  Still waiting on home medications to be reconciled. I reached out to the pharmacy.  I tried to call daughter but no answer to both numbers attempted.  I tried 3 separate times.   Vitals:   06/24/20 0530 06/24/20 0810  BP: 136/74   Pulse: (!) 58   Resp: (!) 23   Temp:  99.7 F (37.6 C)  SpO2: 91% 94%    Results for orders placed or performed during the hospital encounter of 06/24/20  Blood culture (routine single)   Specimen: BLOOD RIGHT HAND  Result Value Ref Range   Specimen Description BLOOD RIGHT HAND    Special Requests      BOTTLES DRAWN AEROBIC AND ANAEROBIC Blood Culture adequate volume Performed at [facility], [address], [place], [place] [address]    Culture PENDING    Report Status PENDING   Resp Panel by RT-PCR (Flu A&B, Covid) Nasopharyngeal Swab   Specimen: Nasopharyngeal Swab; Nasopharyngeal(NP) swabs in vial transport medium  Result Value Ref Range   SARS Coronavirus 2 by RT PCR POSITIVE (A) NEGATIVE   Influenza A by PCR NEGATIVE NEGATIVE   Influenza B by PCR NEGATIVE NEGATIVE  Comprehensive metabolic panel  Result Value Ref Range   Sodium 136 135 - 145 mmol/L   Potassium 3.9 3.5 - 5.1 mmol/L   Chloride 105 98 - 111 mmol/L   CO2 21 (L) 22 - 32 mmol/L   Glucose, Bld 139 (H) 70 - 99 mg/dL   BUN 41 (H) 8 - 23 mg/dL   Creatinine, Ser 2.89 (H) 0.61 - 1.24 mg/dL   Calcium 8.4 (L) 8.9 - 10.3 mg/dL   Total Protein 7.7 6.5 - 8.1 g/dL   Albumin 4.0 3.5 - 5.0 g/dL   AST 40 15 - 41 U/L   ALT 25 0 - 44 U/L   Alkaline Phosphatase 75 38 - 126 U/L   Total Bilirubin 1.0 0.3 - 1.2 mg/dL   GFR, Estimated 22 (L) >60 mL/min   Anion gap 10 5 - 15  CBC with Differential  Result Value Ref Range   WBC 3.9 (L) 4.0 - 10.5 K/uL   RBC 5.42 4.22 - 5.81 MIL/uL   Hemoglobin  15.7 13.0 - 17.0 g/dL   HCT 49.6 39 - 52 %   MCV 91.5 80.0 - 100.0 fL   MCH 29.0 26.0 - 34.0 pg   MCHC 31.7 30.0 - 36.0 g/dL   RDW 15.6 (H) 11.5 - 15.5 %   Platelets 101 (L) 150 - 400 K/uL   nRBC 0.0 0.0 - 0.2 %   Neutrophils Relative % 68 %   Neutro Abs 2.6 1.7 - 7.7 K/uL   Lymphocytes Relative 19 %   Lymphs Abs 0.8 0.7 - 4.0 K/uL   Monocytes Relative 12 %   Monocytes Absolute 0.5 0.1 - 1.0 K/uL   Eosinophils Relative 0 %   Eosinophils Absolute 0.0 0.0 - 0.5 K/uL   Basophils Relative 1 %   Basophils Absolute 0.0 0.0 - 0.1 K/uL   Immature Granulocytes 0 %   Abs Immature Granulocytes 0.01 0.00 - 0.07 K/uL  Lactic acid, plasma  Result Value Ref Range   Lactic Acid, Venous 1.7 0.5 - 1.9 mmol/L  Lactic acid, plasma  Result Value Ref Range  Lactic Acid, Venous 1.7 0.5 - 1.9 mmol/L  Protime-INR  Result Value Ref Range   Prothrombin Time 14.6 11.4 - 15.2 seconds   INR 1.2 0.8 - 1.2  APTT  Result Value Ref Range   aPTT 38 (H) 24 - 36 seconds  Urinalysis, Routine w reflex microscopic Urine, Clean Catch  Result Value Ref Range   Color, Urine YELLOW YELLOW   APPearance CLEAR CLEAR   Specific Gravity, Urine 1.015 1.005 - 1.030   pH 7.0 5.0 - 8.0   Glucose, UA 50 (A) NEGATIVE mg/dL   Hgb urine dipstick NEGATIVE NEGATIVE   Bilirubin Urine NEGATIVE NEGATIVE   Ketones, ur NEGATIVE NEGATIVE mg/dL   Protein, ur >=300 (A) NEGATIVE mg/dL   Nitrite NEGATIVE NEGATIVE   Leukocytes,Ua NEGATIVE NEGATIVE   RBC / HPF 0-5 0 - 5 RBC/hpf   WBC, UA 0-5 0 - 5 WBC/hpf   Bacteria, UA NONE SEEN NONE SEEN   Squamous Epithelial / LPF 0-5 0 - 5  Basic metabolic panel  Result Value Ref Range   Sodium 137 135 - 145 mmol/L   Potassium 3.6 3.5 - 5.1 mmol/L   Chloride 107 98 - 111 mmol/L   CO2 21 (L) 22 - 32 mmol/L   Glucose, Bld 95 70 - 99 mg/dL   BUN 43 (H) 8 - 23 mg/dL   Creatinine, Ser 2.83 (H) 0.61 - 1.24 mg/dL   Calcium 8.0 (L) 8.9 - 10.3 mg/dL   GFR, Estimated 23 (L) >60 mL/min   Anion gap  9 5 - 15  D-dimer, quantitative (not at [facility])  Result Value Ref Range   D-Dimer, Quant 1.75 (H) 0.00 - 0.50 ug/mL-FEU  C-reactive protein  Result Value Ref Range   CRP 11.3 (H) <1.0 mg/dL  Brain natriuretic peptide  Result Value Ref Range   B Natriuretic Peptide 157.0 (H) 0.0 - 100.0 pg/mL  Fibrinogen  Result Value Ref Range   Fibrinogen 696 (H) 210 - 475 mg/dL  Ferritin  Result Value Ref Range   Ferritin 495 (H) 24 - 336 ng/mL  Procalcitonin  Result Value Ref Range   Procalcitonin 0.12 ng/mL  Lactate dehydrogenase  Result Value Ref Range   LDH 226 (H) 98 - 192 U/L  Magnesium  Result Value Ref Range   Magnesium 2.3 1.7 - 2.4 mg/dL  Phosphorus  Result Value Ref Range   Phosphorus 2.7 2.5 - 4.6 mg/dL  Glucose, capillary  Result Value Ref Range   Glucose-Capillary 87 70 - 99 mg/dL   Murvin Natal, MD [facility]   06/24/2020 12:11 AM How to contact the [facility] Attending or Consulting provider 7A - 7P or covering provider during after hours 7P -7A, for this patient?  1. Check the care team in [facility] and look for a) attending/consulting [facility] provider listed and b) the [facility] team listed 2. Log into [email or web address] and use [employer] universal password to access. If you do not have the password, please contact the hospital operator. 3. Locate the [facility] provider you are looking for under [facility] and page to a number that you can be directly reached. 4. If you still have difficulty reaching the provider, please page the [facility] (Director on Call) for the Hospitalists listed on amion for assistance.

## 2020-06-24 NOTE — ED Provider Notes (Addendum)
[facility] EMERGENCY DEPARTMENT Provider Note   CSN: [id number] Arrival date & time: 06/24/20  0004   Time seen 12:53 AM  History Chief Complaint  Patient presents with  . Weakness   Level 5 caveat for altered mental status  [name] [name] is a 74 y.o. male.  HPI   Daughter states patient was in his truck on November 19 and he choked while drinking water.  He had a hard time clearing his airway.  Since then he has started having chills but they could not check him for fever because they did not have a thermometer.  Daughter states today he seemed confused and did not know what year or month it was which he normally would know.  He has had some diarrhea.  Patient denies feeling short of breath although he looks like he is having some difficulty breathing.  He was noted to be coughing during his exam and in retrospect daughter states he has been coughing more.  She states he has home oxygen that he uses as needed, she states he uses it sometimes during the day.  He is on 2 L/min nasal cannula.  However his daughter states tonight his oxygen got down to 80%.  PCP [name]   Past Medical History:  Diagnosis Date  . CAD (coronary artery disease)    DES to LAD June 2018  . CKD (chronic kidney disease) stage 3, GFR 30-59 ml/min (HCC)   . Essential hypertension   . History of pneumonia   . Hyperlipidemia   . Hypothyroidism   . LBBB (left bundle branch block)   . Morbid obesity ([place])   . Non Hodgkin's lymphoma ([place])    Status post XRT and chemotherapy  . NSTEMI (non-ST elevated myocardial infarction) [facility])    June 2018  . Peripheral neuropathy   . Sleep apnea   . Type 2 diabetes mellitus [facility])     Patient Active Problem List   Diagnosis Date Noted  . Aspiration pneumonia ([place]) 06/24/2020  . Small bowel obstruction ([place]) 01/29/2020  . SBO (small bowel obstruction) ([place]) 01/28/2020  . Primary osteoarthritis of left knee 05/24/2018  . Primary  localized osteoarthritis of left knee 05/19/2018  . Acute on chronic systolic and diastolic heart failure, NYHA class 1 ([place]) 04/01/2017  . CAD (coronary artery disease) 04/01/2017  . SOB (shortness of breath) 03/21/2017  . HTN (hypertension) 03/21/2017  . NSTEMI (non-ST elevated myocardial infarction) ([place]) 01/19/2017  . NSTEMI, initial episode of care ([place]) 01/19/2017  . Sepsis ([place]) 07/24/2016  . Elevated troponin I level 07/24/2016  . Fever 07/24/2016  . Lactic acidosis 07/24/2016  . Adjustment insomnia 05/18/2016  . Erectile dysfunction 12/19/2015  . Sleep apnea 09/30/2015  . Osteoarthritis 09/30/2015  . Hypothyroidism 09/30/2015  . Hypogonadism in male 09/30/2015  . Diabetic neuropathy ([place]) 09/30/2015  . Chronic pain of left knee 09/30/2015  . Benign essential hypertension 09/30/2015  . Acute on chronic kidney failure ([place]) 05/14/2015  . Diarrhea 05/14/2015  . Dehydration 05/14/2015  . Hypokalemia 05/14/2015  . 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 Pelvic mass in male   . Varices, esophageal ([place])   . Colonic mass   . Abnormal CT scan, pelvis   . Bladder mass   . Elevated liver enzymes   . Elevated LFTs   . Abdominal pain 07/23/2014  . Chronic kidney disease Stage IV 07/23/2014  . Morbid obesity ([place]) 07/23/2014  . Type 2 diabetes mellitus with stage  4 chronic kidney disease ([place]) 07/23/2014  . Hypertension 07/23/2014  . S/P total knee replacement, 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    Past Surgical History:  Procedure Laterality Date  . CHOLECYSTECTOMY  1992  . COLONOSCOPY N/A 09/03/2014   SLF:six colon polyps removed/small internal hemorrhoids  . CORONARY STENT INTERVENTION N/A 01/21/2017   Procedure: Coronary Stent Intervention;  Surgeon: Nelva Bush, MD;  Location: [place] [facility];  Service: Cardiovascular;  Laterality: N/A;  . ESOPHAGOGASTRODUODENOSCOPY N/A 09/03/2014   SLF: mild gastritis/few gastric polyps  . LEFT HEART CATH AND CORONARY ANGIOGRAPHY N/A 01/20/2017    Procedure: Left Heart Cath and Coronary Angiography;  Surgeon: Jettie Booze, MD;  Location: [place] [facility];  Service: Cardiovascular;  Laterality: N/A;  . TOTAL KNEE ARTHROPLASTY  11/10/2011   Procedure: TOTAL KNEE ARTHROPLASTY;  Surgeon: Mauri Pole, MD;  Location: [facility];  Service: Orthopedics;  Laterality: Right;  . TOTAL KNEE ARTHROPLASTY Left 05/24/2018   Procedure: LEFT TOTAL KNEE ARTHROPLASTY;  Surgeon: Melrose Nakayama, MD;  Location: [place];  Service: Orthopedics;  Laterality: Left;       Family History  Problem Relation Age of Onset  . Cancer Mother        breast and lung  . Cancer Father        bladder  . Cancer Maternal Uncle        prostate  . Cancer Paternal Uncle        esophagus  . Colon cancer Neg Hx     Social History   Tobacco Use  . Smoking status: Current Some Day Smoker    Packs/day: 0.25    Years: 30.00    Pack years: 7.50    Types: Cigarettes    Last attempt to quit: 08/04/2007    Years since quitting: 12.8  . Smokeless tobacco: Never Used  Vaping Use  . Vaping Use: Never used  Substance Use Topics  . Alcohol use: No    Alcohol/week: 0.0 standard drinks  . Drug use: No    Home Medications Prior to Admission medications   Medication Sig Start Date End Date Taking? Authorizing Provider  acetaminophen (TYLENOL) 325 MG tablet Take 2 tablets (650 mg total) by mouth every 6 (six) hours as needed for mild pain (or Fever >/= 101). 02/01/20   Roxan Hockey, MD  albuterol (VENTOLIN HFA) 108 (90 Base) MCG/ACT inhaler Inhale 1-2 puffs into the lungs every 6 (six) hours as needed for wheezing or shortness of breath. 01/10/19   Long, Wonda Olds, MD  amLODipine (NORVASC) 10 MG tablet Take 10 mg by mouth daily.    [provider]  aspirin EC 81 MG tablet Take 1 tablet (81 mg total) by mouth daily with breakfast. Swallow whole. 02/01/20   Roxan Hockey, MD  bisoprolol (ZEBETA) 5 MG tablet Take 2.5 mg by mouth daily.    [provider]   fluticasone (FLONASE) 50 MCG/ACT nasal spray Place 2 sprays into both nostrils daily for 14 days. Patient taking differently: Place 2 sprays into both nostrils as needed.  01/10/19 04/04/20  Long, Wonda Olds, MD  furosemide (LASIX) 40 MG tablet Take 40 mg by mouth daily.    [provider]  hydrALAZINE (APRESOLINE) 25 MG tablet Take 1 tablet (25 mg total) by mouth 2 (two) times daily. 04/04/20 07/03/20  Satira Sark, MD  insulin degludec (TRESIBA FLEXTOUCH) 100 UNIT/ML SOPN FlexTouch Pen Inject 0.5 mLs (50 Units total) into the skin daily at 10 pm. Patient taking differently:  Inject 60 Units into the skin daily at 10 pm.  07/28/16   Kathie Dike, MD  isosorbide mononitrate (IMDUR) 30 MG 24 hr tablet Take 1 tablet (30 mg total) by mouth daily. 04/02/17   Orvan Falconer, MD  levocetirizine (XYZAL) 5 MG tablet Take 5 mg by mouth every evening.  08/27/17   [provider]  levothyroxine (SYNTHROID) 100 MCG tablet Take 100 mcg by mouth daily. (takes with 245mcg for a total of 349mcg) 01/08/20   [provider]  levothyroxine (SYNTHROID, LEVOTHROID) 200 MCG tablet Take 300 mcg by mouth daily before breakfast. (takes with 149mcg for a total of 34mcg)    [provider]  losartan (COZAAR) 100 MG tablet Take 100 mg by mouth daily. 01/08/20   [provider]  nitroGLYCERIN (NITROSTAT) 0.4 MG SL tablet Place 0.4 mg under the tongue every 5 (five) minutes as needed for chest pain.  12/27/17   [provider]  ondansetron (ZOFRAN ODT) 4 MG disintegrating tablet Take 1 tablet (4 mg total) by mouth every 8 (eight) hours as needed for nausea or vomiting. 02/01/20   Roxan Hockey, MD  simvastatin (ZOCOR) 5 MG tablet Take 5 mg by mouth daily. 09/14/16   [provider]  SYMBICORT 160-4.5 MCG/ACT inhaler Inhale 2 puffs into the lungs daily. 12/22/19   [provider]    Allergies    Patient has no known allergies.  Review of Systems   Review of Systems   All other systems reviewed and are negative.   Physical Exam Updated Vital Signs BP (!) 141/82   Pulse 63   Temp 100.1 F (37.8 C) (Rectal)   Resp 20   Ht 6\' 1"  (1.854 m)   Wt (!) 137.4 kg   SpO2 95%   BMI 39.96 kg/m   Physical Exam Vitals and nursing note reviewed.  Constitutional:      General: He is in acute distress.     Appearance: Normal appearance. He is obese. He is ill-appearing.  HENT:     Head: Normocephalic and atraumatic.     Right Ear: External ear normal.     Left Ear: External ear normal.  Eyes:     Extraocular Movements: Extraocular movements intact.     Conjunctiva/sclera: Conjunctivae normal.     Pupils: Pupils are equal, round, and reactive to light.  Cardiovascular:     Rate and Rhythm: Normal rate and regular rhythm.     Pulses: Normal pulses.     Heart sounds: Normal heart sounds.  Pulmonary:     Effort: Tachypnea, accessory muscle usage and prolonged expiration present.     Breath sounds: Decreased breath sounds and rhonchi present.     Comments: Patient is coughing frequently Musculoskeletal:        General: Normal range of motion.     Cervical back: Normal range of motion and neck supple.     Comments: Patient has chronic hyperpigmentation of his lower extremities  Skin:    General: Skin is warm and dry.  Neurological:     General: No focal deficit present.     Mental Status: He is alert.     Cranial Nerves: No cranial nerve deficit.  Psychiatric:        Mood and Affect: Affect is flat.        Speech: Speech is delayed.        Behavior: Behavior is slowed.     ED Results / Procedures / Treatments   Labs (all  labs ordered are listed, but only abnormal results are displayed) Results for orders placed or performed during the hospital encounter of 06/24/20  Blood culture (routine single)   Specimen: BLOOD RIGHT HAND  Result Value Ref Range   Specimen Description BLOOD RIGHT HAND    Special Requests      BOTTLES DRAWN AEROBIC AND  ANAEROBIC Blood Culture adequate volume Performed at [facility], [address] [place], [place] [address]    Culture PENDING    Report Status PENDING   Resp Panel by RT-PCR (Flu A&B, Covid) Nasopharyngeal Swab   Specimen: Nasopharyngeal Swab; Nasopharyngeal(NP) swabs in vial transport medium  Result Value Ref Range   SARS Coronavirus 2 by RT PCR POSITIVE (A) NEGATIVE   Influenza A by PCR NEGATIVE NEGATIVE   Influenza B by PCR NEGATIVE NEGATIVE  Comprehensive metabolic panel  Result Value Ref Range   Sodium 136 135 - 145 mmol/L   Potassium 3.9 3.5 - 5.1 mmol/L   Chloride 105 98 - 111 mmol/L   CO2 21 (L) 22 - 32 mmol/L   Glucose, Bld 139 (H) 70 - 99 mg/dL   BUN 41 (H) 8 - 23 mg/dL   Creatinine, Ser 2.89 (H) 0.61 - 1.24 mg/dL   Calcium 8.4 (L) 8.9 - 10.3 mg/dL   Total Protein 7.7 6.5 - 8.1 g/dL   Albumin 4.0 3.5 - 5.0 g/dL   AST 40 15 - 41 U/L   ALT 25 0 - 44 U/L   Alkaline Phosphatase 75 38 - 126 U/L   Total Bilirubin 1.0 0.3 - 1.2 mg/dL   GFR, Estimated 22 (L) >60 mL/min   Anion gap 10 5 - 15  CBC with Differential  Result Value Ref Range   WBC 3.9 (L) 4.0 - 10.5 K/uL   RBC 5.42 4.22 - 5.81 MIL/uL   Hemoglobin 15.7 13.0 - 17.0 g/dL   HCT 49.6 39 - 52 %   MCV 91.5 80.0 - 100.0 fL   MCH 29.0 26.0 - 34.0 pg   MCHC 31.7 30.0 - 36.0 g/dL   RDW 15.6 (H) 11.5 - 15.5 %   Platelets 101 (L) 150 - 400 K/uL   nRBC 0.0 0.0 - 0.2 %   Neutrophils Relative % 68 %   Neutro Abs 2.6 1.7 - 7.7 K/uL   Lymphocytes Relative 19 %   Lymphs Abs 0.8 0.7 - 4.0 K/uL   Monocytes Relative 12 %   Monocytes Absolute 0.5 0.1 - 1.0 K/uL   Eosinophils Relative 0 %   Eosinophils Absolute 0.0 0.0 - 0.5 K/uL   Basophils Relative 1 %   Basophils Absolute 0.0 0.0 - 0.1 K/uL   Immature Granulocytes 0 %   Abs Immature Granulocytes 0.01 0.00 - 0.07 K/uL  Lactic acid, plasma  Result Value Ref Range   Lactic Acid, Venous 1.7 0.5 - 1.9 mmol/L  Lactic acid, plasma  Result Value Ref Range   Lactic Acid,  Venous 1.7 0.5 - 1.9 mmol/L  Protime-INR  Result Value Ref Range   Prothrombin Time 14.6 11.4 - 15.2 seconds   INR 1.2 0.8 - 1.2  APTT  Result Value Ref Range   aPTT 38 (H) 24 - 36 seconds  Urinalysis, Routine w reflex microscopic Urine, Clean Catch  Result Value Ref Range   Color, Urine YELLOW YELLOW   APPearance CLEAR CLEAR   Specific Gravity, Urine 1.015 1.005 - 1.030   pH 7.0 5.0 - 8.0   Glucose, UA 50 (A) NEGATIVE mg/dL  Hgb urine dipstick NEGATIVE NEGATIVE   Bilirubin Urine NEGATIVE NEGATIVE   Ketones, ur NEGATIVE NEGATIVE mg/dL   Protein, ur >=300 (A) NEGATIVE mg/dL   Nitrite NEGATIVE NEGATIVE   Leukocytes,Ua NEGATIVE NEGATIVE   RBC / HPF 0-5 0 - 5 RBC/hpf   WBC, UA 0-5 0 - 5 WBC/hpf   Bacteria, UA NONE SEEN NONE SEEN   Squamous Epithelial / LPF 0-5 0 - 5   Laboratory interpretation all normal except thrombocytopenia, stable renal insufficiency,+covid    EKG EKG Interpretation  Date/Time:  Monday June 24 2020 01:10:08 EST Ventricular Rate:  60 PR Interval:    QRS Duration: 146 QT Interval:  453 QTC Calculation: 453 R Axis:   -86 Text Interpretation: Sinus rhythm Prolonged PR interval Nonspecific IVCD with LAD Left ventricular hypertrophy Inferior infarct, old Anterior infarct, old No significant change since last tracing 10 Jan 2019 Confirmed by Rolland Porter [phone]) on 06/24/2020 1:25:23 AM   Radiology DG Chest Port 1 View  Result Date: 06/24/2020 CLINICAL DATA:  Weakness, lethargy, and confusion after choking episode a couple of days ago. EXAM: PORTABLE CHEST 1 VIEW COMPARISON:  01/10/2019 FINDINGS: Mild cardiac enlargement. Mild pulmonary vascular congestion. No airspace disease or consolidation. No pleural effusions. No pneumothorax. Mediastinal contours appear intact.  electronically Signed   By: Lucienne Capers M.D.   On: 06/24/2020 01:37    Procedures .Critical Care Performed by: Rolland Porter, MD Authorized by: Rolland Porter, MD   Critical care  provider statement:    Critical care time (minutes):  33   Critical care was necessary to treat or prevent imminent or life-threatening deterioration of the following conditions:  Sepsis   Critical care was time spent personally by me on the following activities:  Discussions with consultants, examination of patient, obtaining history from patient or surrogate, ordering and review of radiographic studies, ordering and review of laboratory studies, pulse oximetry and re-evaluation of patient's condition   (including critical care time)  Medications Ordered in ED Medications  acetaminophen (TYLENOL) tablet 650 mg (has no administration in time range)    Or  acetaminophen (TYLENOL) suppository 650 mg (has no administration in time range)  ondansetron (ZOFRAN) tablet 4 mg (has no administration in time range)    Or  ondansetron (ZOFRAN) injection 4 mg (has no administration in time range)  ipratropium (ATROVENT) nebulizer solution 0.5 mg (has no administration in time range)  albuterol (PROVENTIL) (2.5 MG/3ML) 0.083% nebulizer solution 2.5 mg (has no administration in time range)  albuterol (PROVENTIL) (2.5 MG/3ML) 0.083% nebulizer solution 2.5 mg (has no administration in time range)  sodium chloride 0.9 % bolus 500 mL (0 mLs Intravenous Stopped 06/24/20 0116)  lactated ringers bolus 1,000 mL (0 mLs Intravenous Stopped 06/24/20 0226)  clindamycin (CLEOCIN) IVPB 900 mg (0 mg Intravenous Stopped 06/24/20 0225)    ED Course  I have reviewed the triage vital signs and the nursing notes.  Pertinent labs & imaging results that were available during my care of the patient were reviewed by me and considered in my medical decision making (see chart for details).    MDM Rules/Calculators/A&P                         Patient was given IV fluids, sepsis protocol was started although code sepsis was not called.  His rectal temperature was 100.1.  Blood cultures and urine and urine cultures were  obtained.  From patient's history he sounds like he has had  aspiration and probably has developed aspiration pneumonia.  He has had hypoxia despite being on his normal oxygen at 2 L/min nasal cannula.  Patient was given clindamycin IV for his aspiration pneumonia.  Patient is maintaining good oxygenation at 4 L/min nasal cannula.  Patient's chest x-ray does not show obvious pneumonia.  3:22 AM Dr. Olevia Bowens, hospitalist will admit.  [name] [name] was evaluated in Emergency Department on 06/24/2020 for the symptoms described in the history of present illness. He was evaluated in the context of the global COVID-19 pandemic, which necessitated consideration that the patient might be at risk for infection with the SARS-CoV-2 virus that causes COVID-19. Institutional protocols and algorithms that pertain to the evaluation of patients at risk for COVID-19 are in a state of rapid change based on information released by regulatory bodies including the CDC and federal and state organizations. These policies and algorithms were followed during the patient's care in the ED.   Final Clinical Impression(s) / ED Diagnoses Final diagnoses:  Hypoxia  Aspiration pneumonia, unspecified aspiration pneumonia type, unspecified laterality, unspecified part of lung ([place])  Confusion  COVID-19    Rx / DC Orders  Plan admission  Rolland Porter, MD, Barbette Or, MD 06/24/20 [place], [place], MD 06/24/20 [phone]

## 2020-06-24 NOTE — H&P (Signed)
History and Physical    [name] [name]:[id number] DOB: 1946/04/30 DOA: 06/24/2020  PCP: [name]  Patient coming from: Home.  I have personally briefly reviewed patient's old medical records in [place]  Chief Complaint: Weakness and confusion.  HPI: [name] [name] is a 74 y.o. male with medical history significant of CAD, history of NSTEMI/history of stent placement in 2018, LBBB, stage IV CKD, hypertension, history of pneumonia, hyperlipidemia, hypothyroidism, class III obesity, history of non-Hodgkin's lymphoma with chemo and radiotherapy, sleep apnea, peripheral neuropathy, type 2 diabetes mellitus who is brought to the emergency department due to progressively weakness and confusion since developing persisting cough after apparently aspirating water while drinking.  He has been having multiple episodes of diarrhea in the past day.  No abdominal pain, nausea, vomiting, melena or hematochezia.  He denies fever, chills, night sweats, but feels fatigued.  He denies chest pain, palpitations, diaphoresis, PND, orthopnea or pitting edema of the lower extremities.  No dysuria, frequency or hematuria.  No polyuria, polydipsia, polyphagia or blurred vision.  ED Course: Initial vital signs were temperature 98.3 and then 100.2 F, pulse 68, respirations 22, BP 183/79 mmHg O2 sat 96% on nasal cannula oxygen.  Labs: Urinalysis showed glucosuria 50 and proteinuria more than 300 mg/dL.  Microscopic urine examination was unremarkable.  His CBC showed a white count of 3.9, hemoglobin 15.7 g/dL and platelets 101.  PT 14.6, INR 1.2 and PTT 38.  Lactic acid was normal twice.  CMP showed a CO2 of 21 mmol/L with a normal anion gap and a calcium of 8.4 mg/dL.  The rest of the electrolytes are within normal limits.  Hepatic functions were unremarkable.  Glucose 139, BUN 41 and creatinine 2.89 mg/dL.  His baseline creatinine is usually around 2.4-2.5 or lower.  Imaging: His 1  view chest radiograph show mild cardiac enlargement and pulmonary vascular congestion.  There was no edema or consolidation.  Please see images and full radiology report for further detail.    Review of Systems: As per HPI otherwise all other systems reviewed and are negative.  Past Medical History:  Diagnosis Date  . CAD (coronary artery disease)    DES to LAD June 2018  . CKD (chronic kidney disease) stage 3, GFR 30-59 ml/min (HCC)   . Essential hypertension   . History of pneumonia   . Hyperlipidemia   . Hypothyroidism   . LBBB (left bundle branch block)   . Morbid obesity ([place])   . Non Hodgkin's lymphoma ([place])    Status post XRT and chemotherapy  . NSTEMI (non-ST elevated myocardial infarction) [facility])    June 2018  . Peripheral neuropathy   . Sleep apnea   . Type 2 diabetes mellitus ([place])     Past Surgical History:  Procedure Laterality Date  . CHOLECYSTECTOMY  1992  . COLONOSCOPY N/A 09/03/2014   SLF:six colon polyps removed/small internal hemorrhoids  . CORONARY STENT INTERVENTION N/A 01/21/2017   Procedure: Coronary Stent Intervention;  Surgeon: Nelva Bush, MD;  Location: [place] [facility];  Service: Cardiovascular;  Laterality: N/A;  . ESOPHAGOGASTRODUODENOSCOPY N/A 09/03/2014   SLF: mild gastritis/few gastric polyps  . LEFT HEART CATH AND CORONARY ANGIOGRAPHY N/A 01/20/2017   Procedure: Left Heart Cath and Coronary Angiography;  Surgeon: Jettie Booze, MD;  Location: [place] [facility];  Service: Cardiovascular;  Laterality: N/A;  . TOTAL KNEE ARTHROPLASTY  11/10/2011   Procedure: TOTAL KNEE ARTHROPLASTY;  Surgeon: Mauri Pole, MD;  Location[facility];  Service: Orthopedics;  Laterality: Right;  . TOTAL KNEE ARTHROPLASTY Left 05/24/2018   Procedure: LEFT TOTAL KNEE ARTHROPLASTY;  Surgeon: Melrose Nakayama, MD;  Location: [place];  Service: Orthopedics;  Laterality: Left;    Social History  reports that he has been smoking cigarettes. He has a 7.50 pack-year  smoking history. He has never used smokeless tobacco. He reports that he does not drink alcohol and does not use drugs.  No Known Allergies  Family History  Problem Relation Age of Onset  . Cancer Mother        breast and lung  . Cancer Father        bladder  . Cancer Maternal Uncle        prostate  . Cancer Paternal Uncle        esophagus  . Colon cancer Neg Hx    Prior to Admission medications   Medication Sig Start Date End Date Taking? Authorizing Provider  acetaminophen (TYLENOL) 325 MG tablet Take 2 tablets (650 mg total) by mouth every 6 (six) hours as needed for mild pain (or Fever >/= 101). 02/01/20   Roxan Hockey, MD  albuterol (VENTOLIN HFA) 108 (90 Base) MCG/ACT inhaler Inhale 1-2 puffs into the lungs every 6 (six) hours as needed for wheezing or shortness of breath. 01/10/19   Long, Wonda Olds, MD  amLODipine (NORVASC) 10 MG tablet Take 10 mg by mouth daily.    [provider]  aspirin EC 81 MG tablet Take 1 tablet (81 mg total) by mouth daily with breakfast. Swallow whole. 02/01/20   Roxan Hockey, MD  bisoprolol (ZEBETA) 5 MG tablet Take 2.5 mg by mouth daily.    [provider]  fluticasone (FLONASE) 50 MCG/ACT nasal spray Place 2 sprays into both nostrils daily for 14 days. Patient taking differently: Place 2 sprays into both nostrils as needed.  01/10/19 04/04/20  Long, Wonda Olds, MD  furosemide (LASIX) 40 MG tablet Take 40 mg by mouth daily.    [provider]  hydrALAZINE (APRESOLINE) 25 MG tablet Take 1 tablet (25 mg total) by mouth 2 (two) times daily. 04/04/20 07/03/20  Satira Sark, MD  insulin degludec (TRESIBA FLEXTOUCH) 100 UNIT/ML SOPN FlexTouch Pen Inject 0.5 mLs (50 Units total) into the skin daily at 10 pm. Patient taking differently: Inject 60 Units into the skin daily at 10 pm.  07/28/16   Kathie Dike, MD  isosorbide mononitrate (IMDUR) 30 MG 24 hr tablet Take 1 tablet (30 mg total) by mouth daily. 04/02/17   Orvan Falconer, MD   levocetirizine (XYZAL) 5 MG tablet Take 5 mg by mouth every evening.  08/27/17   [provider]  levothyroxine (SYNTHROID) 100 MCG tablet Take 100 mcg by mouth daily. (takes with 250mcg for a total of 318mcg) 01/08/20   [provider]  levothyroxine (SYNTHROID, LEVOTHROID) 200 MCG tablet Take 300 mcg by mouth daily before breakfast. (takes with 153mcg for a total of 385mcg)    [provider]  losartan (COZAAR) 100 MG tablet Take 100 mg by mouth daily. 01/08/20   [provider]  nitroGLYCERIN (NITROSTAT) 0.4 MG SL tablet Place 0.4 mg under the tongue every 5 (five) minutes as needed for chest pain.  12/27/17   [provider]  ondansetron (ZOFRAN ODT) 4 MG disintegrating tablet Take 1 tablet (4 mg total) by mouth every 8 (eight) hours as needed for nausea or vomiting. 02/01/20   Roxan Hockey, MD  simvastatin (ZOCOR) 5  MG tablet Take 5 mg by mouth daily. 09/14/16   [provider]  SYMBICORT 160-4.5 MCG/ACT inhaler Inhale 2 puffs into the lungs daily. 12/22/19   [provider]   Physical Exam: Vitals:   06/24/20 0130 06/24/20 0230 06/24/20 0300 06/24/20 0330  BP: (!) 159/81 (!) 144/68 (!) 158/92 (!) 141/82  Pulse: (!) 59 62 63   Resp: (!) 21 (!) 22 (!) 23 20  Temp:      TempSrc:      SpO2: 95% 94% 96% 95%  Weight:      Height:       Constitutional: Looks acutely ill. Eyes: PERRL, lids and conjunctivae injected. ENMT: Mucous membranes are dry. Posterior pharynx clear of any exudate or lesions.  Neck: normal, supple, no masses, no thyromegaly Respiratory: Mildly tachypneic in the low to mid 20s with decreased breath sounds with scattered crackles bilaterally.  No accessory muscle use.  Cardiovascular: Regular rate and rhythm, no murmurs / rubs / gallops. No extremity edema. 2+ pedal pulses. No carotid bruits.  Abdomen: Obese, nondistended.  Bowel sounds positive.  Soft, no tenderness, no masses palpated. No  hepatosplenomegaly. Musculoskeletal: Moderate generalized weakness.  No clubbing / cyanosis. Good ROM, no contractures. Normal muscle tone.  Skin: Bilateral LE hyperpigmentation, particularly on pretibial areas. Neurologic: CN 2-12 grossly intact. Sensation intact, DTR normal. Strength 4/5 in all 4.  Psychiatric: Alert and oriented x 2, partially oriented to time and situation. Normal mood.   Labs on Admission: I have personally reviewed following labs and imaging studies  CBC: Recent Labs  Lab 06/24/20 0029  WBC 3.9*  NEUTROABS 2.6  HGB 15.7  HCT 49.6  MCV 91.5  PLT 101*    Basic Metabolic Panel: Recent Labs  Lab 06/24/20 0029  NA 136  K 3.9  CL 105  CO2 21*  GLUCOSE 139*  BUN 41*  CREATININE 2.89*  CALCIUM 8.4*    GFR: Estimated Creatinine Clearance: 33.1 mL/min (A) (by C-G formula based on SCr of 2.89 mg/dL (H)).  Liver Function Tests: Recent Labs  Lab 06/24/20 0029  AST 40  ALT 25  ALKPHOS 75  BILITOT 1.0  PROT 7.7  ALBUMIN 4.0    Urine analysis:    Component Value Date/Time   COLORURINE YELLOW 06/24/2020 0220   APPEARANCEUR CLEAR 06/24/2020 0220   LABSPEC 1.015 06/24/2020 0220   PHURINE 7.0 06/24/2020 0220   GLUCOSEU 50 (A) 06/24/2020 0220   HGBUR NEGATIVE 06/24/2020 0220   BILIRUBINUR NEGATIVE 06/24/2020 0220   KETONESUR NEGATIVE 06/24/2020 0220   PROTEINUR >=300 (A) 06/24/2020 0220   UROBILINOGEN 4.0 (H) 07/23/2014 1356   NITRITE NEGATIVE 06/24/2020 0220   LEUKOCYTESUR NEGATIVE 06/24/2020 0220    Radiological Exams on Admission: DG Chest Port 1 View  Result Date: 06/24/2020 CLINICAL DATA:  Weakness, lethargy, and confusion after choking episode a couple of days ago. EXAM: PORTABLE CHEST 1 VIEW COMPARISON:  01/10/2019 FINDINGS: Mild cardiac enlargement. Mild pulmonary vascular congestion. No airspace disease or consolidation. No pleural effusions. No pneumothorax. Mediastinal contours appear intact. IMPRESSION: Mild cardiac enlargement and  pulmonary vascular congestion. No edema or consolidation. Electronically Signed   By: Lucienne Capers M.D.   On: 06/24/2020 01:37    EKG: Independently reviewed.  Vent. rate 60 BPM PR interval * ms QRS duration 146 ms QT/QTc 453/453 ms P-R-T axes 1 -86 56 Sinus rhythm Prolonged PR interval Nonspecific IVCD with LAD Left ventricular hypertrophy Inferior infarct, old Anterior infarct, old  Assessment/Plan Principal Problem:  Aspiration pneumonia ([place]) Admit to stepdown. Continue supplemental oxygen. Continue bronchodilators. Begin Unasyn per pharmacy. Follow-up blood culture and sensitivity.  Active Problems: Medication reconciliation still pending.    Pneumonia due to COVID-19 virus Continue supplemental oxygen. Flutter valve and I-S. Bronchodilators as needed. Continue dexamethasone 6 mg IV every 24 hours. Remdesivir per pharmacy. Follow-up CBC, CMP and inflammatory markers.    Type 2 diabetes mellitus with stage 4 chronic kidney disease (HCC) Carbohydrate modified diet. Tradjenta 5 mg p.o. daily. Levemir 14 units SQ twice daily. CBG monitoring with RI SS every 4 hours while on glucocorticoids.    Hypertension Continue amlodipine 10 mg p.o. daily. Continue bisoprolol 2.5 mg p.o. daily Continue hydralazine 25 mg p.o. twice daily. Continue furosemide 40 mg p.o. daily. Monitor BP, HR, renal function electrolytes.    Acute on chronic kidney failure (HCC) Received 500 mL NS bolus in the ED. Received 1000 mL LR bolus as well. Monitor renal function electrolytes.    Sleep apnea Continue CPAP if negative pressure room available.    Hypothyroidism Continue levothyroxine 300 mcg p.o. daily.    CAD (coronary artery disease) Continue aspirin 81 mg p.o. daily. Continue bisoprolol 2.5 mg p.o. daily. Continue simvastatin 5 mg p.o. daily. Continue Imdur 30 mg p.o. daily.   DVT prophylaxis: SCDs.  (Thrombocytopenic at 101K on daily aspirin). Code Status:   Full  code. Family Communication:   Disposition Plan:   Patient is from:  Home.  Anticipated DC to:  Home.  Anticipated DC date:  11/20 10/21/2019 or 06/26/2020.Marland Kitchen  Anticipated DC barriers: Clinical improvement.  Consults called: Admission status:  Observation/stepdown.  Severity of Illness:  Reubin Milan MD [facility]  How to contact the [facility] Attending or Consulting provider [place] or covering provider during after hours [place], for this patient?   1. Check the care team in [facility] and look for a) attending/consulting TR[facility] provider listed and b) the [facility] team listed 2. Log into [email or web address] and use [place] universal password to access. If you do not have the password, please contact the hospital operator. 3. Locate the [facility] provider you are looking for under [facility] and page to a number that you can be directly reached. 4. If you still have difficulty reaching the provider, please page the [facility] (Director on Call) for the Hospitalists listed on amion for assistance.  06/24/2020, 4:10 AM   This document was prepared using Dragon voice recognition software and may contain some unintended transcription errors.

## 2020-06-24 NOTE — ED Triage Notes (Signed)
Pt's family states that pt choked on Friday and has since became weak, lethargic, and confused. Pt normally wears 2LPM, currently on 4LPM

## 2020-06-24 NOTE — Evaluation (Signed)
Clinical/Bedside Swallow Evaluation Patient Details  Name: [name] [name] MRN: [id number] Date of Birth: 1946-03-01  Today's Date: 06/24/2020 Time: SLP Start Time (ACUTE ONLY): 1400 SLP Stop Time (ACUTE ONLY): 1425 SLP Time Calculation (min) (ACUTE ONLY): 25 min  Past Medical History:  Past Medical History:  Diagnosis Date  . CAD (coronary artery disease)    DES to LAD June 2018  . CKD (chronic kidney disease) stage 3, GFR 30-59 ml/min (HCC)   . Essential hypertension   . History of pneumonia   . Hyperlipidemia   . Hypothyroidism   . LBBB (left bundle branch block)   . Morbid obesity ([place])   . Non Hodgkin's lymphoma ([place])    Status post XRT and chemotherapy  . NSTEMI (non-ST elevated myocardial infarction) [facility])    June 2018  . Peripheral neuropathy   . Sleep apnea   . Type 2 diabetes mellitus ([place])    Past Surgical History:  Past Surgical History:  Procedure Laterality Date  . CHOLECYSTECTOMY  1992  . COLONOSCOPY N/A 09/03/2014   SLF:six colon polyps removed/small internal hemorrhoids  . CORONARY STENT INTERVENTION N/A 01/21/2017   Procedure: Coronary Stent Intervention;  Surgeon: Nelva Bush, MD;  Location: [place] [facility];  Service: Cardiovascular;  Laterality: N/A;  . ESOPHAGOGASTRODUODENOSCOPY N/A 09/03/2014   SLF: mild gastritis/few gastric polyps  . LEFT HEART CATH AND CORONARY ANGIOGRAPHY N/A 01/20/2017   Procedure: Left Heart Cath and Coronary Angiography;  Surgeon: Jettie Booze, MD;  Location: [place] [facility];  Service: Cardiovascular;  Laterality: N/A;  . TOTAL KNEE ARTHROPLASTY  11/10/2011   Procedure: TOTAL KNEE ARTHROPLASTY;  Surgeon: Mauri Pole, MD;  [facility];  Service: Orthopedics;  Laterality: Right;  . TOTAL KNEE ARTHROPLASTY Left 05/24/2018   Procedure: LEFT TOTAL KNEE ARTHROPLASTY;  Surgeon: Melrose Nakayama, MD;  Location: [place];  Service: Orthopedics;  Laterality: Left;   HPI:  [name] [name] is a 74 y.o. male with medical  history significant of CAD, history of NSTEMI/history of stent placement in 2018, LBBB, stage IV CKD, hypertension, history of pneumonia, hyperlipidemia, hypothyroidism, class III obesity, history of non-Hodgkin's lymphoma with chemo and radiotherapy, sleep apnea, peripheral neuropathy, type 2 diabetes mellitus who is brought to the emergency department due to progressively weakness and confusion since developing persisting cough after apparently aspirating water while drinking.  He has been having multiple episodes of diarrhea in the past day.  No abdominal pain, nausea, vomiting, melena or hematochezia.  He denies fever, chills, night sweats, but feels fatigued.  He denies chest pain, palpitations, diaphoresis, PND, orthopnea or pitting edema of the lower extremities.  No dysuria, frequency or hematuria.  No polyuria, polydipsia, polyphagia or blurred vision. COVID+. BSE requested.   Assessment / Plan / Recommendation Clinical Impression  Clinical swallow evaluation completed at bedside. Oral motor examination is WNL. Pt reports that he was driving and took a sip of water that "went down the wrong way" and coughing ensued. He wears oxygen occasionally at home per Pt report. Pt had just consumed his lunch meal (D2 and thin) prior to SLP arrival, however he was willing to try regular textures with SLP. Pt initially without signs of oropharyngeal dysphagia, however he did have one strong coughing episode when he drank sequential swallows of thin after regular textures. SLP educated Pt on respiration/swallow reciprocity and increased risk for aspiration in individuals with respiratory difficulties. He was encouraged to take small bites/sips, avoid straws, and to avoid talking during po  intake. Will advance to regular textures and follow for diet tolerance.    SLP Visit Diagnosis: Dysphagia, unspecified (R13.10)    Aspiration Risk  Mild aspiration risk    Diet Recommendation Regular;Thin liquid   Liquid  Administration via: Cup;Straw Medication Administration: Whole meds with liquid Supervision: Patient able to self feed Compensations: Slow rate;Small sips/bites Postural Changes: Seated upright at 90 degrees;Remain upright for at least 30 minutes after po intake    Other  Recommendations Oral Care Recommendations: Oral care BID Other Recommendations: Clarify dietary restrictions   Follow up Recommendations None      Frequency and Duration min 2x/week  1 week       Prognosis Prognosis for Safe Diet Advancement: Good      Swallow Study   General Date of Onset: 06/24/20 HPI: [name] [name] is a 74 y.o. male with medical history significant of CAD, history of NSTEMI/history of stent placement in 2018, LBBB, stage IV CKD, hypertension, history of pneumonia, hyperlipidemia, hypothyroidism, class III obesity, history of non-Hodgkin's lymphoma with chemo and radiotherapy, sleep apnea, peripheral neuropathy, type 2 diabetes mellitus who is brought to the emergency department due to progressively weakness and confusion since developing persisting cough after apparently aspirating water while drinking.  He has been having multiple episodes of diarrhea in the past day.  No abdominal pain, nausea, vomiting, melena or hematochezia.  He denies fever, chills, night sweats, but feels fatigued.  He denies chest pain, palpitations, diaphoresis, PND, orthopnea or pitting edema of the lower extremities.  No dysuria, frequency or hematuria.  No polyuria, polydipsia, polyphagia or blurred vision. COVID+. BSE requested. Type of Study: Bedside Swallow Evaluation Previous Swallow Assessment: none on record Diet Prior to this Study: Dysphagia 2 (chopped);Thin liquids Temperature Spikes Noted: No Respiratory Status: Nasal cannula History of Recent Intubation: No Behavior/Cognition: Alert;Cooperative;Pleasant mood Oral Cavity Assessment: Within Functional Limits Oral Care Completed by SLP: No Oral Cavity -  Dentition: Dentures, top Vision: Functional for self-feeding Self-Feeding Abilities: Able to feed self Patient Positioning: Upright in bed Baseline Vocal Quality: Normal Volitional Cough: Strong Volitional Swallow: Able to elicit    Oral/Motor/Sensory Function Overall Oral Motor/Sensory Function: Within functional limits   Ice Chips Ice chips: Within functional limits Presentation: Spoon   Thin Liquid Thin Liquid: Within functional limits Presentation: Cup;Self Fed;Straw Other Comments: one delayed coughing episode    Nectar Thick Nectar Thick Liquid: Not tested   Honey Thick Honey Thick Liquid: Not tested   Puree Puree: Within functional limits Presentation: Self Fed;Spoon   Solid     Solid: Within functional limits Presentation: Self Fed     Thank you,  Genene Churn, [place]  [place] 06/24/2020,3:19 PM

## 2020-06-25 DIAGNOSIS — Z7951 Long term (current) use of inhaled steroids: Secondary | ICD-10-CM | POA: Diagnosis not present

## 2020-06-25 DIAGNOSIS — U071 COVID-19: Secondary | ICD-10-CM | POA: Diagnosis present

## 2020-06-25 DIAGNOSIS — J9621 Acute and chronic respiratory failure with hypoxia: Secondary | ICD-10-CM | POA: Diagnosis present

## 2020-06-25 DIAGNOSIS — I1310 Hypertensive heart and chronic kidney disease without heart failure, with stage 1 through stage 4 chronic kidney disease, or unspecified chronic kidney disease: Secondary | ICD-10-CM | POA: Diagnosis present

## 2020-06-25 DIAGNOSIS — N179 Acute kidney failure, unspecified: Secondary | ICD-10-CM | POA: Diagnosis present

## 2020-06-25 DIAGNOSIS — E1122 Type 2 diabetes mellitus with diabetic chronic kidney disease: Secondary | ICD-10-CM | POA: Diagnosis present

## 2020-06-25 DIAGNOSIS — Z9049 Acquired absence of other specified parts of digestive tract: Secondary | ICD-10-CM | POA: Diagnosis not present

## 2020-06-25 DIAGNOSIS — R410 Disorientation, unspecified: Secondary | ICD-10-CM | POA: Diagnosis present

## 2020-06-25 DIAGNOSIS — Z7982 Long term (current) use of aspirin: Secondary | ICD-10-CM | POA: Diagnosis not present

## 2020-06-25 DIAGNOSIS — J9622 Acute and chronic respiratory failure with hypercapnia: Secondary | ICD-10-CM | POA: Diagnosis present

## 2020-06-25 DIAGNOSIS — Z794 Long term (current) use of insulin: Secondary | ICD-10-CM | POA: Diagnosis not present

## 2020-06-25 DIAGNOSIS — Z8572 Personal history of non-Hodgkin lymphomas: Secondary | ICD-10-CM | POA: Diagnosis not present

## 2020-06-25 DIAGNOSIS — N184 Chronic kidney disease, stage 4 (severe): Secondary | ICD-10-CM | POA: Diagnosis present

## 2020-06-25 DIAGNOSIS — Z96653 Presence of artificial knee joint, bilateral: Secondary | ICD-10-CM | POA: Diagnosis present

## 2020-06-25 DIAGNOSIS — E1142 Type 2 diabetes mellitus with diabetic polyneuropathy: Secondary | ICD-10-CM | POA: Diagnosis present

## 2020-06-25 DIAGNOSIS — E038 Other specified hypothyroidism: Secondary | ICD-10-CM | POA: Diagnosis not present

## 2020-06-25 DIAGNOSIS — G4733 Obstructive sleep apnea (adult) (pediatric): Secondary | ICD-10-CM | POA: Diagnosis present

## 2020-06-25 DIAGNOSIS — E039 Hypothyroidism, unspecified: Secondary | ICD-10-CM | POA: Diagnosis present

## 2020-06-25 DIAGNOSIS — J1282 Pneumonia due to coronavirus disease 2019: Secondary | ICD-10-CM | POA: Diagnosis present

## 2020-06-25 DIAGNOSIS — G4730 Sleep apnea, unspecified: Secondary | ICD-10-CM

## 2020-06-25 DIAGNOSIS — J690 Pneumonitis due to inhalation of food and vomit: Secondary | ICD-10-CM | POA: Diagnosis present

## 2020-06-25 DIAGNOSIS — Z79899 Other long term (current) drug therapy: Secondary | ICD-10-CM | POA: Diagnosis not present

## 2020-06-25 DIAGNOSIS — G9341 Metabolic encephalopathy: Secondary | ICD-10-CM | POA: Diagnosis present

## 2020-06-25 DIAGNOSIS — Z79890 Hormone replacement therapy (postmenopausal): Secondary | ICD-10-CM | POA: Diagnosis not present

## 2020-06-25 DIAGNOSIS — E785 Hyperlipidemia, unspecified: Secondary | ICD-10-CM | POA: Diagnosis present

## 2020-06-25 DIAGNOSIS — I2510 Atherosclerotic heart disease of native coronary artery without angina pectoris: Secondary | ICD-10-CM | POA: Diagnosis not present

## 2020-06-25 DIAGNOSIS — Z923 Personal history of irradiation: Secondary | ICD-10-CM | POA: Diagnosis not present

## 2020-06-25 DIAGNOSIS — F17210 Nicotine dependence, cigarettes, uncomplicated: Secondary | ICD-10-CM | POA: Diagnosis present

## 2020-06-25 LAB — COMPREHENSIVE METABOLIC PANEL
ALT: 23 U/L (ref 0–44)
AST: 29 U/L (ref 15–41)
Albumin: 3.2 g/dL — ABNORMAL LOW (ref 3.5–5.0)
Alkaline Phosphatase: 66 U/L (ref 38–126)
Anion gap: 10 (ref 5–15)
BUN: 51 mg/dL — ABNORMAL HIGH (ref 8–23)
CO2: 20 mmol/L — ABNORMAL LOW (ref 22–32)
Calcium: 8.2 mg/dL — ABNORMAL LOW (ref 8.9–10.3)
Chloride: 107 mmol/L (ref 98–111)
Creatinine, Ser: 2.84 mg/dL — ABNORMAL HIGH (ref 0.61–1.24)
GFR, Estimated: 23 mL/min — ABNORMAL LOW (ref >60)
Glucose, Bld: 151 mg/dL — ABNORMAL HIGH (ref 70–99)
Potassium: 4.1 mmol/L (ref 3.5–5.1)
Sodium: 137 mmol/L (ref 135–145)
Total Bilirubin: 0.4 mg/dL (ref 0.3–1.2)
Total Protein: 6.7 g/dL (ref 6.5–8.1)

## 2020-06-25 LAB — URINE CULTURE: Culture: NO GROWTH

## 2020-06-25 LAB — CBC WITH DIFFERENTIAL/PLATELET
Abs Immature Granulocytes: 0.01 10*3/uL (ref 0.00–0.07)
Basophils Absolute: 0 10*3/uL (ref 0.0–0.1)
Basophils Relative: 0 %
Eosinophils Absolute: 0 10*3/uL (ref 0.0–0.5)
Eosinophils Relative: 0 %
HCT: 45.7 % (ref 39.0–52.0)
Hemoglobin: 14.9 g/dL (ref 13.0–17.0)
Immature Granulocytes: 0 %
Lymphocytes Relative: 25 %
Lymphs Abs: 0.8 10*3/uL (ref 0.7–4.0)
MCH: 29.3 pg (ref 26.0–34.0)
MCHC: 32.6 g/dL (ref 30.0–36.0)
MCV: 90 fL (ref 80.0–100.0)
Monocytes Absolute: 0.4 10*3/uL (ref 0.1–1.0)
Monocytes Relative: 13 %
Neutro Abs: 1.9 10*3/uL (ref 1.7–7.7)
Neutrophils Relative %: 62 %
Platelets: 96 10*3/uL — ABNORMAL LOW (ref 150–400)
RBC: 5.08 MIL/uL (ref 4.22–5.81)
RDW: 14.8 % (ref 11.5–15.5)
WBC: 3.1 10*3/uL — ABNORMAL LOW (ref 4.0–10.5)
nRBC: 0 % (ref 0.0–0.2)

## 2020-06-25 LAB — GLUCOSE, CAPILLARY
Glucose-Capillary: 160 mg/dL — ABNORMAL HIGH (ref 70–99)
Glucose-Capillary: 212 mg/dL — ABNORMAL HIGH (ref 70–99)
Glucose-Capillary: 209 mg/dL — ABNORMAL HIGH (ref 70–99)
Glucose-Capillary: 203 mg/dL — ABNORMAL HIGH (ref 70–99)
Glucose-Capillary: 170 mg/dL — ABNORMAL HIGH (ref 70–99)

## 2020-06-25 LAB — LIPID PANEL
Cholesterol: 95 mg/dL (ref 0–200)
HDL: 21 mg/dL — ABNORMAL LOW (ref >40)
LDL Cholesterol: 49 mg/dL (ref 0–99)
Total CHOL/HDL Ratio: 4.5 RATIO
Triglycerides: 126 mg/dL (ref <150)
VLDL: 25 mg/dL (ref 0–40)

## 2020-06-25 LAB — HEMOGLOBIN A1C
Hgb A1c MFr Bld: 5.8 % — ABNORMAL HIGH (ref 4.8–5.6)
Mean Plasma Glucose: 119.76 mg/dL

## 2020-06-25 LAB — D-DIMER, QUANTITATIVE: D-Dimer, Quant: 1.45 ug/mL-FEU — ABNORMAL HIGH (ref 0.00–0.50)

## 2020-06-25 LAB — PROCALCITONIN: Procalcitonin: <0.1 ng/mL

## 2020-06-25 LAB — FERRITIN: Ferritin: 524 ng/mL — ABNORMAL HIGH (ref 24–336)

## 2020-06-25 LAB — C-REACTIVE PROTEIN: CRP: 8.6 mg/dL — ABNORMAL HIGH (ref <1.0)

## 2020-06-25 MED ORDER — AMLODIPINE BESYLATE 5 MG PO TABS
10.0000 mg | ORAL_TABLET | Freq: Every day | ORAL | Status: DC
  Administered 2020-06-25 – 2020-06-29 (×5): 10 mg via ORAL
  Filled 2020-06-25 (×5): qty 2

## 2020-06-25 MED ORDER — SODIUM CHLORIDE 0.9 % IV SOLN: INTRAVENOUS | Status: DC

## 2020-06-25 MED ORDER — HYDRALAZINE HCL 20 MG/ML IJ SOLN
10.0000 mg | INTRAMUSCULAR | Status: DC | PRN
  Administered 2020-06-27: 10 mg via INTRAVENOUS
  Filled 2020-06-25: qty 1

## 2020-06-25 MED ORDER — BISOPROLOL FUMARATE 5 MG PO TABS: 2.5000 mg | ORAL_TABLET | Freq: Every day | ORAL | Status: DC

## 2020-06-25 NOTE — Progress Notes (Signed)
PROGRESS NOTE   [name] [name]  OXB:[id number] DOB: 1946-02-02 DOA: 06/24/2020 PCP: [name]   Chief Complaint  Patient presents with  . Weakness    Brief Admission History:  74 y.o. male with medical history significant of CAD, history of NSTEMI/history of stent placement in 2018, LBBB, stage IV CKD, hypertension, history of pneumonia, hyperlipidemia, hypothyroidism, class III obesity, history of non-Hodgkin's lymphoma with chemo and radiotherapy, sleep apnea, peripheral neuropathy, type 2 diabetes mellitus who is brought to the emergency department due to progressively weakness and confusion since developing persisting cough after apparently aspirating water while drinking.  He has been having multiple episodes of diarrhea in the past day.  No abdominal pain, nausea, vomiting, melena or hematochezia.  He denies fever, chills, night sweats, but feels fatigued.  He denies chest pain, palpitations, diaphoresis, PND, orthopnea or pitting edema of the lower extremities.  No dysuria, frequency or hematuria.  No polyuria, polydipsia, polyphagia or blurred vision.  He was admitted with Covid pneumonia and concern for aspiration pneumonia.  This was witnessed by a family member who said he choked after drinking some water and then began to have respiratory distress afterwards.    Assessment & Plan:   Principal Problem:   Aspiration pneumonia ([place]) Active Problems:   Type 2 diabetes mellitus with stage 4 chronic kidney disease (HCC)   Hypertension   Acute on chronic kidney failure (HCC)   Sleep apnea   Hypothyroidism   CAD (coronary artery disease)   Pneumonia due to COVID-19 virus   Confusion   Acute on chronic respiratory failure with hypoxia ([place])  1. Acute respiratory failure secondary to Covid pneumonia - Pt remains on 4L/min supplemental oxygen.  His mentation is markedly improved.  He is on dexamethasone IV every 24 hours.  He has been tolerating remdesivir  therapy.  Continue supportive measures and continue to follow inflammatory markers.  2. Type 2 diabetes mellitus with renal complications-  Continue tradjenta, levemir and SSI coverage with frequent CBG monitoring.   3. Stage 4 CKD - monitoring renal function daily.  Renally dose medications as appropriate.  4. Essential hypertension -  We hae resumed home meds except temporarily holding bisoprolol for bradycardia.   5. Hypothryroidism - resume home levothyroxine every morning.  6. Aspiration pneumonia - fortunately his procalcitonin is reassuring and we have discontinued Unasyn.  He was evaluated by SLP and they have recommended regular diet, thin liquids. He likely aspirated due to his encephalopathy. 7. Metabolic encephalopathy - resolving with supportive measures.  8. CAD - no chest pain symptoms, resumed home aspirin, imdur, simvastatin, temporarily held bisoprolol in the setting of heart rates less than 50.   9. OSA - CPAP ordered.    DVT prophylaxis:  SCDs Code Status: Full  Family Communication: sister by telephone, unable to reach daughter after several attempts Disposition: anticipating need for SNF   Status is: Inpatient  Remains inpatient appropriate because:IV treatments appropriate due to intensity of illness or inability to take PO and Inpatient level of care appropriate due to severity of illness   Dispo:  Patient From: Home  Planned Disposition: Home  Expected discharge date: 06/28/20  Medically stable for discharge: No Consultants:     Procedures:     Antimicrobials:  unasyn 11/22   Subjective: Pt reports that he feels weak and he is having a dry hacking cough but otherwise no chest pain and SOB is better when wearing the oxygen.   Objective: Vitals:  06/25/20 0800 06/25/20 0822 06/25/20 0900 06/25/20 1000  BP: (!) 166/73  (!) 139/54 (!) 156/61  Pulse: (!) 56  (!) 44 (!) 58  Resp: 19  (!) 22 19  Temp:  (!) 97.5 F (36.4 C)    TempSrc:  Oral    SpO2:  96% 94% 94% 97%  Weight:      Height:        Intake/Output Summary (Last 24 hours) at 06/25/2020 1152 Last data filed at 06/25/2020 0900 Gross per 24 hour  Intake 259.21 ml  Output 3025 ml  Net -2765.79 ml   Filed Weights   06/24/20 0009 06/24/20 0500  Weight: (!) 137.4 kg 134.5 kg   Examination:  General exam: chronically ill appearing male, awake, alert, Appears calm and comfortable  Respiratory system: rales bibasilar. Respiratory effort normal. Cardiovascular system: S1 & S2 heard. No JVD, murmurs, rubs, gallops or clicks. No pedal edema. Gastrointestinal system: Abdomen is obese, nondistended, soft and nontender. No organomegaly or masses felt. Normal bowel sounds heard. Central nervous system: Alert and oriented. No focal neurological deficits. Extremities: Symmetric 5 x 5 power. Skin: No rashes, lesions or ulcers Psychiatry: Judgement and insight appear normal. Mood & affect appropriate.   Data Reviewed: I have personally reviewed following labs and imaging studies  CBC: Recent Labs  Lab 06/24/20 0029 06/24/20 0839 06/25/20 0607  WBC 3.9* 4.0 3.1*  NEUTROABS 2.6 2.4 1.9  HGB 15.7 15.3 14.9  HCT 49.6 47.6 45.7  MCV 91.5 91.7 90.0  PLT 101* 89* 96*    Basic Metabolic Panel: Recent Labs  Lab 06/24/20 0029 06/24/20 0839 06/25/20 0607  NA 136 137 137  K 3.9 3.6 4.1  CL 105 107 107  CO2 21* 21* 20*  GLUCOSE 139* 95 151*  BUN 41* 43* 51*  CREATININE 2.89* 2.83* 2.84*  CALCIUM 8.4* 8.0* 8.2*  MG 2.3  --   --   PHOS 2.7  --   --     GFR: Estimated Creatinine Clearance: 33.3 mL/min (A) (by C-G formula based on SCr of 2.84 mg/dL (H)).  Liver Function Tests: Recent Labs  Lab 06/24/20 0029 06/25/20 0607  AST 40 29  ALT 25 23  ALKPHOS 75 66  BILITOT 1.0 0.4  PROT 7.7 6.7  ALBUMIN 4.0 3.2*    CBG: Recent Labs  Lab 06/24/20 1119 06/24/20 1623 06/24/20 2040 06/25/20 0337 06/25/20 0807  GLUCAP 137* 194* 174* 160* 212*    Recent Results  (from the past 240 hour(s))  Blood culture (routine single)     Status: None (Preliminary result)   Collection Time: 06/24/20  1:31 AM   Specimen: BLOOD RIGHT HAND  Result Value Ref Range Status   Specimen Description BLOOD RIGHT HAND  Final   Special Requests   Final    BOTTLES DRAWN AEROBIC AND ANAEROBIC Blood Culture adequate volume   Culture   Final    NO GROWTH 1 DAY Performed at [facility], [address], [place], [place] [address]    Report Status PENDING  Incomplete  Urine culture     Status: None   Collection Time: 06/24/20  2:20 AM   Specimen: In/Out Cath Urine  Result Value Ref Range Status   Specimen Description   Final    IN/OUT CATH URINE Performed at [facility], [address], [place], [place] [address]    Special Requests   Final    NONE Performed at [facility], [address] [place], [place] [address]  Culture   Final    NO GROWTH Performed at [facility], [place] [address], [place], [place] [address]    Report Status 06/25/2020 FINAL  Final  Resp Panel by RT-PCR (Flu A&B, Covid) Nasopharyngeal Swab     Status: Abnormal   Collection Time: 06/24/20  2:46 AM   Specimen: Nasopharyngeal Swab; Nasopharyngeal(NP) swabs in vial transport medium  Result Value Ref Range Status   SARS Coronavirus 2 by RT PCR POSITIVE (A) NEGATIVE Final    Comment: RESULT CALLED TO, READ BACK BY AND VERIFIED WITH: [place],C @ 4287 ON 06/24/20 BY JUW (NOTE) SARS-CoV-2 target nucleic acids are DETECTED.  The SARS-CoV-2 RNA is generally detectable in upper respiratory specimens during the acute phase of infection. Positive results are indicative of the presence of the identified virus, but do not rule out bacterial infection or co-infection with other pathogens not detected by the test. Clinical correlation with patient history and other diagnostic information is necessary to determine patient infection status. The expected result is Negative.  Fact Sheet for  Patients: EntrepreneurPulse.com.au  Fact Sheet for Healthcare Providers: IncredibleEmployment.be  This test is not yet approved or cleared by the [place] FDA and  has been authorized for detection and/or diagnosis of SARS-CoV-2 by FDA under an Emergency Use Authorization (EUA).  This EUA will remain in effect (meaning this test c an be used) for the duration of  the COVID-19 declaration under Section 564(b)(1) of the Act, 21 U.S.C. section 360bbb-3(b)(1), unless the authorization is terminated or revoked sooner.     Influenza A by PCR NEGATIVE NEGATIVE Final   Influenza B by PCR NEGATIVE NEGATIVE Final    Comment: (NOTE) The Xpert Xpress SARS-CoV-2/FLU/RSV plus assay is intended as an aid in the diagnosis of influenza from Nasopharyngeal swab specimens and should not be used as a sole basis for treatment. Nasal washings and aspirates are unacceptable for Xpert Xpress SARS-CoV-2/FLU/RSV testing.  Fact Sheet for Patients: EntrepreneurPulse.com.au  Fact Sheet for Healthcare Providers: IncredibleEmployment.be  This test is not yet approved or cleared by the [place] FDA and has been authorized for detection and/or diagnosis of SARS-CoV-2 by FDA under an Emergency Use Authorization (EUA). This EUA will remain in effect (meaning this test can be used) for the duration of the COVID-19 declaration under Section 564(b)(1) of the Act, 21 U.S.C. section 360bbb-3(b)(1), unless the authorization is terminated or revoked.  Performed at [facility], [address], [place], [place] [address]   MRSA PCR Screening     Status: None   Collection Time: 06/24/20  9:17 AM   Specimen: Nasal Mucosa; Nasopharyngeal  Result Value Ref Range Status   MRSA by PCR NEGATIVE NEGATIVE Final    Comment:        The GeneXpert MRSA Assay (FDA approved for NASAL specimens only), is one component of a comprehensive MRSA  colonization surveillance program. It is not intended to diagnose MRSA infection nor to guide or monitor treatment for MRSA infections. Performed at [facility], [address], [place], [place] [address]      Radiology Studies: [facility] Chest [facility] 1 View  Result Date: 06/24/2020 CLINICAL DATA:  Weakness, lethargy, and confusion after choking episode a couple of days ago. EXAM: PORTABLE CHEST 1 VIEW COMPARISON:  01/10/2019 FINDINGS: Mild cardiac enlargement. Mild pulmonary vascular congestion. No airspace disease or consolidation. No pleural effusions. No pneumothorax. Mediastinal contours appear intact. IMPRESSION: Mild cardiac enlargement and pulmonary vascular congestion. No edema or consolidation. Electronically Signed  By: Lucienne Capers M.D.   On: 06/24/2020 01:37   Scheduled Meds: . amLODipine  10 mg Oral Daily  . vitamin C  500 mg Oral Daily  . aspirin EC  81 mg Oral Q breakfast  . Chlorhexidine Gluconate Cloth  6 each Topical Daily  . dexamethasone (DECADRON) injection  6 mg Intravenous Q24H  . insulin aspart  0-15 Units Subcutaneous TID WC  . insulin detemir  10 Units Subcutaneous BID  . Ipratropium-Albuterol  1 puff Inhalation TID  . isosorbide mononitrate  30 mg Oral Daily  . levothyroxine  300 mcg Oral QAC breakfast  . linagliptin  5 mg Oral Daily  . mometasone-formoterol  2 puff Inhalation BID  . simvastatin  5 mg Oral q1800  . zinc sulfate  220 mg Oral Daily   Continuous Infusions: . sodium chloride 30 mL/hr at 06/25/20 0812  . remdesivir 100 mg in NS 100 mL 100 mg (06/25/20 1017)     LOS: 0 days   Critical Care Procedure Note Authorized and Performed by: Murvin Natal MD  Total Critical Care time:  35 minutes  Due to a high probability of clinically significant, life threatening deterioration, the patient required my highest level of preparedness to intervene emergently and I personally spent this critical care time directly and personally managing the patient.   This critical care time included obtaining a history; examining the patient, pulse oximetry; ordering and review of studies; arranging urgent treatment with development of a management plan; evaluation of patient's response of treatment; frequent reassessment; and discussions with other providers.  This critical care time was performed to assess and manage the high probability of imminent and life threatening deterioration that could result in multi-organ failure.  It was exclusive of separately billable procedures and treating other patients and teaching time.   Irwin Brakeman, MD How to contact the [facility] Attending or Consulting provider Mobile or covering provider during after hours [place], for this patient?  1. Check the care team in [facility] and look for a) attending/consulting TRH provider listed and b) the [facility] team listed 2. Log into [email or web address] and use [place]'s universal password to access. If you do not have the password, please contact the hospital operator. 3. Locate the [facility] provider you are looking for under [facility] and page to a number that you can be directly reached. 4. If you still have difficulty reaching the provider, please page the [facility] (Director on Call) for the Hospitalists listed on amion for assistance.  06/25/2020, 11:52 AM

## 2020-06-26 DIAGNOSIS — J690 Pneumonitis due to inhalation of food and vomit: Secondary | ICD-10-CM | POA: Diagnosis not present

## 2020-06-26 DIAGNOSIS — U071 COVID-19: Secondary | ICD-10-CM | POA: Diagnosis not present

## 2020-06-26 DIAGNOSIS — E1122 Type 2 diabetes mellitus with diabetic chronic kidney disease: Secondary | ICD-10-CM | POA: Diagnosis not present

## 2020-06-26 DIAGNOSIS — J9621 Acute and chronic respiratory failure with hypoxia: Secondary | ICD-10-CM | POA: Diagnosis not present

## 2020-06-26 LAB — CBC WITH DIFFERENTIAL/PLATELET
Abs Immature Granulocytes: 0.05 10*3/uL (ref 0.00–0.07)
Basophils Absolute: 0 10*3/uL (ref 0.0–0.1)
Basophils Relative: 1 %
Eosinophils Absolute: 0 10*3/uL (ref 0.0–0.5)
Eosinophils Relative: 0 %
HCT: 44.1 % (ref 39.0–52.0)
Hemoglobin: 14.2 g/dL (ref 13.0–17.0)
Immature Granulocytes: 1 %
Lymphocytes Relative: 28 %
Lymphs Abs: 1.1 10*3/uL (ref 0.7–4.0)
MCH: 29 pg (ref 26.0–34.0)
MCHC: 32.2 g/dL (ref 30.0–36.0)
MCV: 90.2 fL (ref 80.0–100.0)
Monocytes Absolute: 0.3 10*3/uL (ref 0.1–1.0)
Monocytes Relative: 8 %
Neutro Abs: 2.4 10*3/uL (ref 1.7–7.7)
Neutrophils Relative %: 62 %
Platelets: 100 10*3/uL — ABNORMAL LOW (ref 150–400)
RBC: 4.89 MIL/uL (ref 4.22–5.81)
RDW: 14.7 % (ref 11.5–15.5)
WBC: 3.8 10*3/uL — ABNORMAL LOW (ref 4.0–10.5)
nRBC: 0 % (ref 0.0–0.2)

## 2020-06-26 LAB — COMPREHENSIVE METABOLIC PANEL
ALT: 27 U/L (ref 0–44)
AST: 33 U/L (ref 15–41)
Albumin: 3 g/dL — ABNORMAL LOW (ref 3.5–5.0)
Alkaline Phosphatase: 71 U/L (ref 38–126)
Anion gap: 11 (ref 5–15)
BUN: 54 mg/dL — ABNORMAL HIGH (ref 8–23)
CO2: 17 mmol/L — ABNORMAL LOW (ref 22–32)
Calcium: 7.9 mg/dL — ABNORMAL LOW (ref 8.9–10.3)
Chloride: 108 mmol/L (ref 98–111)
Creatinine, Ser: 2.61 mg/dL — ABNORMAL HIGH (ref 0.61–1.24)
GFR, Estimated: 25 mL/min — ABNORMAL LOW (ref >60)
Glucose, Bld: 245 mg/dL — ABNORMAL HIGH (ref 70–99)
Potassium: 3.3 mmol/L — ABNORMAL LOW (ref 3.5–5.1)
Sodium: 136 mmol/L (ref 135–145)
Total Bilirubin: 0.4 mg/dL (ref 0.3–1.2)
Total Protein: 6.3 g/dL — ABNORMAL LOW (ref 6.5–8.1)

## 2020-06-26 LAB — D-DIMER, QUANTITATIVE: D-Dimer, Quant: 1.47 ug/mL-FEU — ABNORMAL HIGH (ref 0.00–0.50)

## 2020-06-26 LAB — GLUCOSE, CAPILLARY
Glucose-Capillary: 155 mg/dL — ABNORMAL HIGH (ref 70–99)
Glucose-Capillary: 188 mg/dL — ABNORMAL HIGH (ref 70–99)
Glucose-Capillary: 253 mg/dL — ABNORMAL HIGH (ref 70–99)
Glucose-Capillary: 261 mg/dL — ABNORMAL HIGH (ref 70–99)
Glucose-Capillary: 350 mg/dL — ABNORMAL HIGH (ref 70–99)

## 2020-06-26 LAB — PROCALCITONIN: Procalcitonin: <0.1 ng/mL

## 2020-06-26 LAB — FERRITIN: Ferritin: 436 ng/mL — ABNORMAL HIGH (ref 24–336)

## 2020-06-26 LAB — C-REACTIVE PROTEIN: CRP: 5 mg/dL — ABNORMAL HIGH (ref <1.0)

## 2020-06-26 MED ORDER — POTASSIUM CHLORIDE CRYS ER 20 MEQ PO TBCR
20.0000 meq | EXTENDED_RELEASE_TABLET | Freq: Once | ORAL | Status: AC
  Administered 2020-06-26: 20 meq via ORAL
  Filled 2020-06-26: qty 1

## 2020-06-26 MED ORDER — INSULIN DETEMIR 100 UNIT/ML ~~LOC~~ SOLN
15.0000 [IU] | Freq: Two times a day (BID) | SUBCUTANEOUS | Status: DC
  Administered 2020-06-26 – 2020-06-29 (×6): 15 [IU] via SUBCUTANEOUS
  Filled 2020-06-26 (×10): qty 0.15

## 2020-06-26 MED ORDER — INSULIN ASPART 100 UNIT/ML ~~LOC~~ SOLN
4.0000 [IU] | Freq: Three times a day (TID) | SUBCUTANEOUS | Status: DC
  Administered 2020-06-27 – 2020-06-28 (×6): 4 [IU] via SUBCUTANEOUS

## 2020-06-26 MED ORDER — METHYLPREDNISOLONE SODIUM SUCC 125 MG IJ SOLR
60.0000 mg | Freq: Two times a day (BID) | INTRAMUSCULAR | Status: DC
  Administered 2020-06-27 – 2020-06-29 (×5): 60 mg via INTRAVENOUS
  Filled 2020-06-26 (×5): qty 2

## 2020-06-26 NOTE — Progress Notes (Signed)
Patient was tried on nasal CPAP due to MD order. Patient has mentioned he has had a sleep study in past that did not require CPAP/BIPAP/O2 use. Patient does use O2 for over exertion at home. He states he has used MDIs occasionally. RT attempted use of CPAP with patient while in room and he stated he couldn't wear unit due to the mask on his face. Rt left unit in room and he will be sleeping with 4Lpm [place]. Pt nurse made aware of situation.

## 2020-06-26 NOTE — TOC Initial Note (Signed)
Transition of Care [facility]) - Initial/Assessment Note    Patient Details  Name: [name] [name] MRN: [id number] Date of Birth: [birth year]  Transition of Care [facility]) CM/SW Contact:    Natasha Bence, LCSW Phone Number: 06/26/2020, 1:25 PM  Clinical Narrative:                 Patient is a 74 year old male admitted for Aspiration Pneumonia. Patient's readmission risk score 32%. CSW conducted readmission risk assessment. Patient reported that he lives in a sing family home with his daughter and son in law who are able to assist patient with ADL's when needed. Patient reported that he currently utilizes a walker and oxygen for DME equipment in the home. Patient reported Hx with HH, but was not able to recall which agency he utilized. CSW informed patient of PT's recommendation for SNF and inquired about patient's agreeableness. Patient agreeable to SNF placement. CSW inquired about patient's preferred facility. Patient reported that he did not have a preferred facilitity and was agreeable to referrals placed in the [facility] area. CSW placed referral to [facility], and began authorization. TOC to follow.  Expected Discharge Plan: Skilled Nursing Facility Barriers to Discharge: Continued Medical Work up   Patient Goals and CMS Choice Patient states their goals for this hospitalization and ongoing recovery are:: Rehab with SNF CMS Medicare.gov Compare Post Acute Care list provided to:: Patient Choice offered to / list presented to : Patient  Expected Discharge Plan and Services Expected Discharge Plan: [place] arrangements for the past 2 months: Single Family Home                         Representative spoke with at DME Agency: N/A         Representative spoke with at [place]: N/A  Prior Living Arrangements/Services Living arrangements for the past 2 months: [place] with:: Self, Adult Children Patient language and  need for interpreter reviewed:: Yes Do you feel safe going back to the place where you live?: Yes      Need for Family Participation in Patient Care: Yes (Comment) Care giver support system in place?: Yes (comment) Current home services: DME (Walker and O2) Criminal Activity/Legal Involvement Pertinent to Current Situation/Hospitalization: No - Comment as needed  Activities of Daily Living      Permission Sought/Granted Permission sought to share information with : Family Supports Permission granted to share information with : Yes, Verbal Permission Granted  Share Information with NAME: Jessup,Jennifer  Permission granted to share info w AGENCY: Local SNF  Permission granted to share info w Relationship: Daughter  Permission granted to share info w Contact Information: [phone]  Emotional Assessment     Affect (typically observed): Accepting, Adaptable Orientation: : Oriented to Self, Oriented to Situation, Oriented to Place, Oriented to  Time Alcohol / Substance Use: Not Applicable Psych Involvement: No (comment)  Admission diagnosis:  Confusion [R41.0] Aspiration pneumonia (HCC) [J69.0] Hypoxia [R09.02] Aspiration pneumonia, unspecified aspiration pneumonia type, unspecified laterality, unspecified part of lung ([place]) [J69.0] COVID-19 [U07.1] Acute on chronic respiratory failure with hypoxia ([place]) [J96.21] Patient Active Problem List   Diagnosis Date Noted  . Acute on chronic respiratory failure with hypoxia ([place]) 06/25/2020  . Aspiration pneumonia ([place]) 06/24/2020  . Pneumonia due to COVID-19 virus 06/24/2020  . Confusion   . Small bowel obstruction ([place]) 01/29/2020  . SBO (small bowel obstruction) (  [place]) 01/28/2020  . Primary osteoarthritis of left knee 05/24/2018  . Primary localized osteoarthritis of left knee 05/19/2018  . Acute on chronic systolic and diastolic heart failure, NYHA class 1 ([place]) 04/01/2017  . CAD (coronary artery disease) 04/01/2017  . SOB  (shortness of breath) 03/21/2017  . HTN (hypertension) 03/21/2017  . NSTEMI (non-ST elevated myocardial infarction) ([place]) 01/19/2017  . NSTEMI, initial episode of care ([place]) 01/19/2017  . Sepsis ([place]) 07/24/2016  . Elevated troponin I level 07/24/2016  . Fever 07/24/2016  . Lactic acidosis 07/24/2016  . Adjustment insomnia 05/18/2016  . Erectile dysfunction 12/19/2015  . Sleep apnea 09/30/2015  . Osteoarthritis 09/30/2015  . Hypothyroidism 09/30/2015  . Hypogonadism in male 09/30/2015  . Diabetic neuropathy ([place]) 09/30/2015  . Chronic pain of left knee 09/30/2015  . Benign essential hypertension 09/30/2015  . Acute on chronic kidney failure ([place]) 05/14/2015  . Diarrhea 05/14/2015  . Dehydration 05/14/2015  . Hypokalemia 05/14/2015  . 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 Pelvic mass in male   . Varices, esophageal ([place])   . Colonic mass   . Abnormal CT scan, pelvis   . Bladder mass   . Elevated liver enzymes   . Elevated LFTs   . Abdominal pain 07/23/2014  . Chronic kidney disease Stage IV 07/23/2014  . Morbid obesity ([place]) 07/23/2014  . Type 2 diabetes mellitus with stage 4 chronic kidney disease ([place]) 07/23/2014  . Hypertension 07/23/2014  . S/P total knee replacement, 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   PCP:  [name]:   [place], [place] [address] Phone: [phone] Fax: [phone]     Social Determinants of Health (SDOH) Interventions    Readmission Risk Interventions Readmission Risk Prevention Plan 06/26/2020  Transportation Screening Complete  Medication Review ([place]) Complete  PCP or Specialist appointment within 3-5 days of discharge Complete  HRI or Home Care Consult Complete  SW Recovery Care/Counseling Consult Complete  Palliative Care Screening Not [place] Complete  Some recent data might be hidden

## 2020-06-26 NOTE — Progress Notes (Addendum)
PROGRESS NOTE  [name] [name] MLY:[id number] DOB: 02/12/1946 DOA: 06/24/2020 PCP: [name]  Brief History:  74 y.o.malewith medical history significant ofCAD, history of NSTEMI/history of stent placement in 2018, LBBB, stage IV CKD, hypertension, history of pneumonia, hyperlipidemia, hypothyroidism, class III obesity, history of non-Hodgkin's lymphoma with chemo and radiotherapy, sleep apnea, peripheral neuropathy, type 2 diabetes mellitus who is brought to the emergency department due toprogressively weakness and confusion since developing persisting cough after apparently aspirating water while drinking. He has been having multiple episodes of diarrhea in the past day. No abdominal pain, nausea, vomiting, melena or hematochezia. He denies fever, chills, night sweats, but feels fatigued. He denies chest pain, palpitations, diaphoresis, PND, orthopnea or pitting edema of the lower extremities. No dysuria, frequency or hematuria. No polyuria, polydipsia, polyphagia or blurred vision.  He was admitted with Covid pneumonia and concern for aspiration pneumonia.  This was witnessed by a family member who said he choked after drinking some water and then began to have respiratory distress afterwards.    Assessment/Plan: Acute respiratory Failure due to COVID-19 -initially placed on 4L -now stable on 3L -continue remdesivir -changed to IV solumedrol -CRP 11.3>>8.6>>5.0 -D-Dimer 1.75>>1.45>>1.47 -Ferritin 495>>524>>436 -wean oxygen as tolerated -prone positioning as tolerated  Aspiration Pneumonitis -evaluated by speech--regular with thin -initially on unasyn -PCT<0.10--d/c abx  CKD stage 4 -baseline creatinine 2.3-2.6 -am BMP -judicious IVF  Diabetes mellitus type 2 -11/22 A1C--5.8 -elevated CBG due to steroids -add novolog 4 units with meals -increase levemir 15 unit bid  Acute metabolic Encephalopathy -due to dehydration and  COVID-19 -improving  CAD -no chest pain -continue ASA, imdur, zocor -holding bisoprolol due to bradycardia  Essential HTN -continue amlodipine and imdur  Hypothyroidism -continue synthroid  Hypokalemia -replete -check mag -improving  Thrombocytopenia -B12 -folate -TSH -due to viral infection        Status is: Inpatient  Remains inpatient appropriate because:IV treatments appropriate due to intensity of illness or inability to take PO   Dispo:  Patient From: Home  Planned Disposition: SNF  Expected discharge date: 06/27/20  Medically stable for discharge: No         Family Communication:  no Family at bedside  Consultants:  none  Code Status:  FULL / DNR  DVT Prophylaxis:  SCDs   Procedures: As Listed in Progress Note Above  Antibiotics: None       Subjective: Patient denies fevers, chills, headache, chest pain, dyspnea, nausea, vomiting, diarrhea, abdominal pain, dysuria, hematuria, hematochezia, and melena.   Objective: Vitals:   06/26/20 1300 06/26/20 1341 06/26/20 1600 06/26/20 1611  BP: (!) 143/42     Pulse: 64  (!) 52 (!) 57  Resp: 17  17 19   Temp:    97.9 F (36.6 C)  TempSrc:   Oral Oral  SpO2: 96% 96% 99% 97%  Weight:      Height:        Intake/Output Summary (Last 24 hours) at 06/26/2020 1722 Last data filed at 06/26/2020 1600 Gross per 24 hour  Intake 695.24 ml  Output 1575 ml  Net -879.76 ml   Weight change:  Exam:   General:  Pt is alert, follows commands appropriately, not in acute distress  HEENT: No icterus, No thrush, No neck mass, [place]/AT  Cardiovascular: RRR, S1/S2, no rubs, no gallops  Respiratory: bibasilar crackles.  No wheeze  Abdomen: Soft/+BS, non tender, non distended, no guarding  Extremities: No edema, No  lymphangitis, No petechiae, No rashes, no synovitis   Data Reviewed: I have personally reviewed following labs and imaging studies Basic Metabolic Panel: Recent Labs  Lab  06/24/20 0029 06/24/20 0839 06/25/20 0607 06/26/20 0453  NA 136 137 137 136  K 3.9 3.6 4.1 3.3*  CL 105 107 107 108  CO2 21* 21* 20* 17*  GLUCOSE 139* 95 151* 245*  BUN 41* 43* 51* 54*  CREATININE 2.89* 2.83* 2.84* 2.61*  CALCIUM 8.4* 8.0* 8.2* 7.9*  MG 2.3  --   --   --   PHOS 2.7  --   --   --    Liver Function Tests: Recent Labs  Lab 06/24/20 0029 06/25/20 0607 06/26/20 0453  AST 40 29 33  ALT 25 23 27   ALKPHOS 75 66 71  BILITOT 1.0 0.4 0.4  PROT 7.7 6.7 6.3*  ALBUMIN 4.0 3.2* 3.0*   No results for input(s): LIPASE, AMYLASE in the last 168 hours. No results for input(s): AMMONIA in the last 168 hours. Coagulation Profile: Recent Labs  Lab 06/24/20 0029  INR 1.2   CBC: Recent Labs  Lab 06/24/20 0029 06/24/20 0839 06/25/20 0607 06/26/20 0453  WBC 3.9* 4.0 3.1* 3.8*  NEUTROABS 2.6 2.4 1.9 2.4  HGB 15.7 15.3 14.9 14.2  HCT 49.6 47.6 45.7 44.1  MCV 91.5 91.7 90.0 90.2  PLT 101* 89* 96* 100*   Cardiac Enzymes: No results for input(s): CKTOTAL, CKMB, CKMBINDEX, TROPONINI in the last 168 hours. BNP: Invalid input(s): POCBNP CBG: Recent Labs  Lab 06/25/20 2000 06/26/20 0020 06/26/20 0824 06/26/20 1241 06/26/20 1609  GLUCAP 170* 253* 188* 261* 350*   HbA1C: Recent Labs    06/24/20 1919  HGBA1C 5.8*   Urine analysis:    Component Value Date/Time   COLORURINE YELLOW 06/24/2020 0220   APPEARANCEUR CLEAR 06/24/2020 0220   LABSPEC 1.015 06/24/2020 0220   PHURINE 7.0 06/24/2020 0220   GLUCOSEU 50 (A) 06/24/2020 0220   HGBUR NEGATIVE 06/24/2020 0220   BILIRUBINUR NEGATIVE 06/24/2020 0220   KETONESUR NEGATIVE 06/24/2020 0220   PROTEINUR >=300 (A) 06/24/2020 0220   UROBILINOGEN 4.0 (H) 07/23/2014 1356   NITRITE NEGATIVE 06/24/2020 0220   LEUKOCYTESUR NEGATIVE 06/24/2020 0220   Sepsis Labs: @LABRCNTIP (procalcitonin:4,lacticidven:4) ) Recent Results (from the past 240 hour(s))  Blood culture (routine single)     Status: None (Preliminary  result)   Collection Time: 06/24/20  1:31 AM   Specimen: BLOOD RIGHT HAND  Result Value Ref Range Status   Specimen Description BLOOD RIGHT HAND  Final   Special Requests   Final    BOTTLES DRAWN AEROBIC AND ANAEROBIC Blood Culture adequate volume   Culture   Final    NO GROWTH 2 DAYS Performed at [facility], [address] [place], [place] [address]    Report Status PENDING  Incomplete  Urine culture     Status: None   Collection Time: 06/24/20  2:20 AM   Specimen: In/Out Cath Urine  Result Value Ref Range Status   Specimen Description   Final    IN/OUT CATH URINE Performed at [facility], [address], [place], [place] [address]    Special Requests   Final    NONE Performed at [facility], [address], [place], [place] [address]    Culture   Final    NO GROWTH Performed at [facility], [place] [address] [place], [place] [address]    Report Status 06/25/2020 FINAL  Final  Resp Panel by  RT-PCR (Flu A&B, Covid) Nasopharyngeal Swab     Status: Abnormal   Collection Time: 06/24/20  2:46 AM   Specimen: Nasopharyngeal Swab; Nasopharyngeal(NP) swabs in vial transport medium  Result Value Ref Range Status   SARS Coronavirus 2 by RT PCR POSITIVE (A) NEGATIVE Final    Comment: RESULT CALLED TO, READ BACK BY AND VERIFIED WITH: [place],C @ 0932 ON 06/24/20 BY JUW (NOTE) SARS-CoV-2 target nucleic acids are DETECTED.  The SARS-CoV-2 RNA is generally detectable in upper respiratory specimens during the acute phase of infection. Positive results are indicative of the presence of the identified virus, but do not rule out bacterial infection or co-infection with other pathogens not detected by the test. Clinical correlation with patient history and other diagnostic information is necessary to determine patient infection status. The expected result is Negative.  Fact Sheet for Patients: EntrepreneurPulse.com.au  Fact Sheet for Healthcare  Providers: IncredibleEmployment.be  This test is not yet approved or cleared by the [place] FDA and  has been authorized for detection and/or diagnosis of SARS-CoV-2 by FDA under an Emergency Use Authorization (EUA).  This EUA will remain in effect (meaning this test c an be used) for the duration of  the COVID-19 declaration under Section 564(b)(1) of the Act, 21 U.S.C. section 360bbb-3(b)(1), unless the authorization is terminated or revoked sooner.     Influenza A by PCR NEGATIVE NEGATIVE Final   Influenza B by PCR NEGATIVE NEGATIVE Final    Comment: (NOTE) The Xpert Xpress SARS-CoV-2/FLU/RSV plus assay is intended as an aid in the diagnosis of influenza from Nasopharyngeal swab specimens and should not be used as a sole basis for treatment. Nasal washings and aspirates are unacceptable for Xpert Xpress SARS-CoV-2/FLU/RSV testing.  Fact Sheet for Patients: EntrepreneurPulse.com.au  Fact Sheet for Healthcare Providers: IncredibleEmployment.be  This test is not yet approved or cleared by the [place] FDA and has been authorized for detection and/or diagnosis of SARS-CoV-2 by FDA under an Emergency Use Authorization (EUA). This EUA will remain in effect (meaning this test can be used) for the duration of the COVID-19 declaration under Section 564(b)(1) of the Act, 21 U.S.C. section 360bbb-3(b)(1), unless the authorization is terminated or revoked.  Performed at [facility], [address], [place], [place] [address]   MRSA PCR Screening     Status: None   Collection Time: 06/24/20  9:17 AM   Specimen: Nasal Mucosa; Nasopharyngeal  Result Value Ref Range Status   MRSA by PCR NEGATIVE NEGATIVE Final    Comment:        The GeneXpert MRSA Assay (FDA approved for NASAL specimens only), is one component of a comprehensive MRSA colonization surveillance program. It is not intended to diagnose MRSA infection nor  to guide or monitor treatment for MRSA infections. Performed at [facility], [address], [place], [place] [address]      Scheduled Meds: . amLODipine  10 mg Oral Daily  . vitamin C  500 mg Oral Daily  . aspirin EC  81 mg Oral Q breakfast  . Chlorhexidine Gluconate Cloth  6 each Topical Daily  . dexamethasone (DECADRON) injection  6 mg Intravenous Q24H  . insulin aspart  0-15 Units Subcutaneous TID WC  . insulin detemir  10 Units Subcutaneous BID  . Ipratropium-Albuterol  1 puff Inhalation TID  . isosorbide mononitrate  30 mg Oral Daily  . levothyroxine  300 mcg Oral QAC breakfast  . linagliptin  5 mg Oral Daily  . mometasone-formoterol  2 puff  Inhalation BID  . simvastatin  5 mg Oral q1800  . zinc sulfate  220 mg Oral Daily   Continuous Infusions: . sodium chloride 30 mL/hr at 06/26/20 1600  . remdesivir 100 mg in NS 100 mL Stopped (06/26/20 1015)    Procedures/Studies: DG Chest Port 1 View  Result Date: 06/24/2020 CLINICAL DATA:  Weakness, lethargy, and confusion after choking episode a couple of days ago. EXAM: PORTABLE CHEST 1 VIEW COMPARISON:  01/10/2019 FINDINGS: Mild cardiac enlargement. Mild pulmonary vascular congestion. No airspace disease or consolidation. No pleural effusions. No pneumothorax. Mediastinal contours appear intact. IMPRESSION: Mild cardiac enlargement and pulmonary vascular congestion. No edema or consolidation. Electronically Signed   By: Lucienne Capers M.D.   On: 06/24/2020 01:37    Orson Eva, DO  [facility]  If 7PM-7AM, please contact night-coverage [email or web address] Password [email or web address] 06/26/2020, 5:22 PM   LOS: 1 day

## 2020-06-26 NOTE — Progress Notes (Signed)
Patient has been refusing CPAP. 

## 2020-06-26 NOTE — NC FL2 (Signed)
[place] LEVEL OF CARE SCREENING TOOL     IDENTIFICATION  Patient Name: [name] [name] Birthdate: Mar 04, 1946 Sex: male Admission Date (Current Location): 06/24/2020  [facility] and [place] Number:  [employer] and Address:  [place] [address] [place]      Provider Number: [id number]  Attending Physician Name and Address:  Orson Eva, MD  Relative Name and Phone Number:  Ledora Bottcher (Daughter) [phone]    Current Level of Care: SNF Recommended Level of Care: [place] Prior Approval Number:    Date Approved/Denied: 06/26/20 PASRR Number: [id number]  Discharge Plan: SNF    Current Diagnoses: Patient Active Problem List   Diagnosis Date Noted  . Acute on chronic respiratory failure with hypoxia ([place]) 06/25/2020  . Aspiration pneumonia ([place]) 06/24/2020  . Pneumonia due to COVID-19 virus 06/24/2020  . Confusion   . Small bowel obstruction ([place]) 01/29/2020  . SBO (small bowel obstruction) ([place]) 01/28/2020  . Primary osteoarthritis of left knee 05/24/2018  . Primary localized osteoarthritis of left knee 05/19/2018  . Acute on chronic systolic and diastolic heart failure, NYHA class 1 ([place]) 04/01/2017  . CAD (coronary artery disease) 04/01/2017  . SOB (shortness of breath) 03/21/2017  . HTN (hypertension) 03/21/2017  . NSTEMI (non-ST elevated myocardial infarction) ([place]) 01/19/2017  . NSTEMI, initial episode of care ([place]) 01/19/2017  . Sepsis ([place]) 07/24/2016  . Elevated troponin I level 07/24/2016  . Fever 07/24/2016  . Lactic acidosis 07/24/2016  . Adjustment insomnia 05/18/2016  . Erectile dysfunction 12/19/2015  . Sleep apnea 09/30/2015  . Osteoarthritis 09/30/2015  . Hypothyroidism 09/30/2015  . Hypogonadism in male 09/30/2015  . Diabetic neuropathy ([place]) 09/30/2015  . Chronic pain of left knee 09/30/2015  . Benign essential hypertension 09/30/2015  . Acute on chronic kidney  failure ([place]) 05/14/2015  . Diarrhea 05/14/2015  . Dehydration 05/14/2015  . Hypokalemia 05/14/2015  . 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 Pelvic mass in male   . Varices, esophageal ([place])   . Colonic mass   . Abnormal CT scan, pelvis   . Bladder mass   . Elevated liver enzymes   . Elevated LFTs   . Abdominal pain 07/23/2014  . Chronic kidney disease Stage IV 07/23/2014  . Morbid obesity ([place]) 07/23/2014  . Type 2 diabetes mellitus with stage 4 chronic kidney disease ([place]) 07/23/2014  . Hypertension 07/23/2014  . S/P total knee replacement, 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    Orientation RESPIRATION BLADDER Height & Weight     Self, Time, Situation, Place  Normal Continent Weight: 296 lb 8.3 oz (134.5 kg) Height:  6\' 1"  (185.4 cm)  BEHAVIORAL SYMPTOMS/MOOD NEUROLOGICAL BOWEL NUTRITION STATUS      Incontinent Diet (Room service appropriate? Yes; Fluid consistency: Thin)  AMBULATORY STATUS COMMUNICATION OF NEEDS Skin   Extensive Assist Verbally Normal, Other (Comment) (Eczema)                       Personal Care Assistance Level of Assistance  Bathing, Feeding, Dressing Bathing Assistance: Limited assistance Feeding assistance: Independent Dressing Assistance: Maximum assistance     Functional Limitations Info  Sight, Hearing, Speech Sight Info: Adequate Hearing Info: Adequate Speech Info: Adequate    SPECIAL CARE FACTORS FREQUENCY  PT (By licensed PT)     PT Frequency: 5x              Contractures Contractures Info: Not present    Additional Factors Info  Code Status, Allergies Code Status Info: Full Allergies Info: n/a           Current Medications (06/26/2020):  This is the current hospital active medication list Current Facility-Administered Medications  Medication Dose Route Frequency Provider Last Rate Last Admin  . 0.9 %  sodium chloride infusion   Intravenous Continuous Murlean Iba, MD   Paused at 06/26/20 [phone]  . acetaminophen (TYLENOL)  tablet 650 mg  650 mg Oral Q6H PRN Reubin Milan, MD   650 mg at 06/26/20 0932   Or  . acetaminophen (TYLENOL) suppository 650 mg  650 mg Rectal Q6H PRN Reubin Milan, MD      . albuterol (VENTOLIN HFA) 108 (90 Base) MCG/ACT inhaler 2 puff  2 puff Inhalation Q4H PRN Johnson, Clanford L, MD      . amLODipine (NORVASC) tablet 10 mg  10 mg Oral Daily Johnson, Clanford L, MD   10 mg at 06/26/20 0951  . ascorbic acid (VITAMIN C) tablet 500 mg  500 mg Oral Daily Reubin Milan, MD   500 mg at 06/26/20 0951  . aspirin EC tablet 81 mg  81 mg Oral Q breakfast Johnson, Clanford L, MD   81 mg at 06/26/20 0849  . Chlorhexidine Gluconate Cloth 2 % PADS 6 each  6 each Topical Daily Wynetta Emery, Clanford L, MD   6 each at 06/24/20 1530  . chlorpheniramine-HYDROcodone (TUSSIONEX) 10-8 MG/5ML suspension 5 mL  5 mL Oral Q12H PRN Reubin Milan, MD      . dexamethasone (DECADRON) injection 6 mg  6 mg Intravenous Q24H Reubin Milan, MD   6 mg at 06/26/20 3557  . guaiFENesin-dextromethorphan (ROBITUSSIN DM) 100-10 MG/5ML syrup 10 mL  10 mL Oral Q4H PRN Reubin Milan, MD      . hydrALAZINE (APRESOLINE) injection 10 mg  10 mg Intravenous Q4H PRN Johnson, Clanford L, MD      . insulin aspart (novoLOG) injection 0-15 Units  0-15 Units Subcutaneous TID WC Reubin Milan, MD   3 Units at 06/26/20 [phone]  . insulin detemir (LEVEMIR) injection 10 Units  10 Units Subcutaneous BID Murlean Iba, MD   10 Units at 06/26/20 0951  . Ipratropium-Albuterol (COMBIVENT) respimat 1 puff  1 puff Inhalation TID Wynetta Emery, Clanford L, MD   1 puff at 06/26/20 0829  . isosorbide mononitrate (IMDUR) 24 hr tablet 30 mg  30 mg Oral Daily Johnson, Clanford L, MD   30 mg at 06/26/20 0952  . levothyroxine (SYNTHROID) tablet 300 mcg  300 mcg Oral QAC breakfast Wynetta Emery, Clanford L, MD   300 mcg at 06/26/20 0527  . linagliptin (TRADJENTA) tablet 5 mg  5 mg Oral Daily Reubin Milan, MD   5 mg at 06/26/20 0951   . mometasone-formoterol (DULERA) 100-5 MCG/ACT inhaler 2 puff  2 puff Inhalation BID Wynetta Emery, Clanford L, MD   2 puff at 06/26/20 0829  . nitroGLYCERIN (NITROSTAT) SL tablet 0.4 mg  0.4 mg Sublingual Q5 min PRN Wynetta Emery, Clanford L, MD      . ondansetron (ZOFRAN) tablet 4 mg  4 mg Oral Q6H PRN Reubin Milan, MD       Or  . ondansetron [facility]) injection 4 mg  4 mg Intravenous Q6H PRN Reubin Milan, MD      . remdesivir 100 mg in sodium chloride 0.9 % 100 mL IVPB  100 mg Intravenous Daily Reubin Milan, MD 200 mL/hr at 06/26/20 1000 Rate Verify at  06/26/20 1000  . simvastatin (ZOCOR) tablet 5 mg  5 mg Oral q1800 Johnson, Clanford L, MD   5 mg at 06/25/20 1729  . zinc sulfate capsule 220 mg  220 mg Oral Daily Reubin Milan, MD   220 mg at 06/26/20 1438     Discharge Medications: Please see discharge summary for a list of discharge medications.  Relevant Imaging Results:  Relevant Lab Results:   Additional Information PT SSN:  [id number]  Natasha Bence, LCSW

## 2020-06-26 NOTE — Progress Notes (Signed)
  Speech Language Pathology Treatment: Dysphagia  Patient Details Name: [name] [name] MRN: [id number] DOB: 1946-06-01 Today's Date: 06/26/2020 Time: 5320-2334 SLP Time Calculation (min) (ACUTE ONLY): 22 min  Assessment / Plan / Recommendation Clinical Impression  Pt seen for ongoing dysphagia intervention following BSE completed on Monday. Pt has been consuming regular textures and thin liquids without incident per staff. Pt consumed regular textures and thin cranberry juice in session today with one delayed cough following sequential cup sips of thin. Pt was reminded to take small sips given respiratory demands. No further SLP services indicated at this time. SLP will sign off. Above to RN and Pt.   HPI HPI: [name] [name] is a 74 y.o. male with medical history significant of CAD, history of NSTEMI/history of stent placement in 2018, LBBB, stage IV CKD, hypertension, history of pneumonia, hyperlipidemia, hypothyroidism, class III obesity, history of non-Hodgkin's lymphoma with chemo and radiotherapy, sleep apnea, peripheral neuropathy, type 2 diabetes mellitus who is brought to the emergency department due to progressively weakness and confusion since developing persisting cough after apparently aspirating water while drinking.  He has been having multiple episodes of diarrhea in the past day.  No abdominal pain, nausea, vomiting, melena or hematochezia.  He denies fever, chills, night sweats, but feels fatigued.  He denies chest pain, palpitations, diaphoresis, PND, orthopnea or pitting edema of the lower extremities.  No dysuria, frequency or hematuria.  No polyuria, polydipsia, polyphagia or blurred vision. COVID+. BSE requested.      SLP Plan  Discharge SLP treatment due to (comment);All goals met       Recommendations  Diet recommendations: Regular;Thin liquid Liquids provided via: Cup Medication Administration: Whole meds with liquid Supervision: Patient able to self  feed Compensations: Slow rate;Small sips/bites Postural Changes and/or Swallow Maneuvers: Upright 30-60 min after meal;Seated upright 90 degrees                Oral Care Recommendations: Oral care BID Follow up Recommendations: None SLP Visit Diagnosis: Dysphagia, unspecified (R13.10) Plan: Discharge SLP treatment due to (comment);All goals met       Thank you,  Genene Churn, [place]                 [place] 06/26/2020, 1:27 PM

## 2020-06-26 NOTE — Progress Notes (Signed)
Inpatient Diabetes Program Recommendations  AACE/ADA: New Consensus Statement on Inpatient Glycemic Control   Target Ranges:  Prepandial:   less than 140 mg/dL      Peak postprandial:   less than 180 mg/dL (1-2 hours)      Critically ill patients:  140 - 180 mg/dL  Results for ZAIDIN, BLYDEN" (MRN [id number]) as of 06/26/2020 08:01  Ref. Range 06/26/2020 04:53  Glucose Latest Ref Range: 70 - 99 mg/dL 245 (H)   Results for NORTON, BIVINS "BOB" (MRN [id number]) as of 06/26/2020 08:01  Ref. Range 06/25/2020 08:07 06/25/2020 11:57 06/25/2020 17:20 06/25/2020 20:00 06/26/2020 00:20  Glucose-Capillary Latest Ref Range: 70 - 99 mg/dL 212 (H) 209 (H) 203 (H) 170 (H) 253 (H)   Review of Glycemic Control  Diabetes history: DM2 Outpatient Diabetes medications: Tresiba 60 units QHS, Novolog (not taking Novolog) Current orders for Inpatient glycemic control: Levemir 10 units BID, Novolog 0-15 units TID with meals, Tradjenta 5 mg daily; Decadron 6 mg Q24H  Inpatient Diabetes Program Recommendations:    Insulin: If steroids are continued, please consider increasing Levemir to 15 units BID, adding Novolog 0-5 units QHS for bedtime correction, and adding Novolog 4 units TID with meals for meal coverage if patient eats at least 50% of meals.  Thanks, Barnie Alderman, RN, MSN, CDE Diabetes Coordinator Inpatient Diabetes Program [phone] (Team Pager from 8am to 5pm)

## 2020-06-26 NOTE — Plan of Care (Signed)
  Problem: Acute Rehab PT Goals(only PT should resolve) Goal: Pt Will Go Supine/Side To Sit Outcome: Progressing Flowsheets (Taken 06/26/2020 1152) Pt will go Supine/Side to Sit:  with supervision  with modified independence Goal: Patient Will Transfer Sit To/From Stand Outcome: Progressing Flowsheets (Taken 06/26/2020 1152) Patient will transfer sit to/from stand:  with min guard assist  with minimal assist Goal: Pt Will Transfer Bed To Chair/Chair To Bed Outcome: Progressing Flowsheets (Taken 06/26/2020 1152) Pt will Transfer Bed to Chair/Chair to Bed:  min guard assist  with min assist Goal: Pt Will Ambulate Outcome: Progressing Flowsheets (Taken 06/26/2020 1152) Pt will Ambulate:  50 feet  with min guard assist  with minimal assist  with rolling walker   11:53 AM, 06/26/20 [name], MPT Physical Therapist with [facility] [phone] office [phone] mobile phone

## 2020-06-26 NOTE — NC FL2 (Signed)
[place] LEVEL OF CARE SCREENING TOOL     IDENTIFICATION  Patient Name: [name] [name] Birthdate: Dec 11, 1945 Sex: male Admission Date (Current Location): 06/24/2020  [facility] and [place] Number:  [employer] and Address:  [place] [address] [place]      Provider Number: [id number]  Attending Physician Name and Address:  Orson Eva, MD  Relative Name and Phone Number:  Ledora Bottcher (Daughter) [phone]    Current Level of Care: SNF Recommended Level of Care: [place] Prior Approval Number:    Date Approved/Denied: 06/26/20 PASRR Number: [id number]  Discharge Plan: SNF    Current Diagnoses: Patient Active Problem List   Diagnosis Date Noted  . Acute on chronic respiratory failure with hypoxia ([place]) 06/25/2020  . Aspiration pneumonia ([place]) 06/24/2020  . Pneumonia due to COVID-19 virus 06/24/2020  . Confusion   . Small bowel obstruction ([place]) 01/29/2020  . SBO (small bowel obstruction) ([place]) 01/28/2020  . Primary osteoarthritis of left knee 05/24/2018  . Primary localized osteoarthritis of left knee 05/19/2018  . Acute on chronic systolic and diastolic heart failure, NYHA class 1 ([place]) 04/01/2017  . CAD (coronary artery disease) 04/01/2017  . SOB (shortness of breath) 03/21/2017  . HTN (hypertension) 03/21/2017  . NSTEMI (non-ST elevated myocardial infarction) ([place]) 01/19/2017  . NSTEMI, initial episode of care ([place]) 01/19/2017  . Sepsis ([place]) 07/24/2016  . Elevated troponin I level 07/24/2016  . Fever 07/24/2016  . Lactic acidosis 07/24/2016  . Adjustment insomnia 05/18/2016  . Erectile dysfunction 12/19/2015  . Sleep apnea 09/30/2015  . Osteoarthritis 09/30/2015  . Hypothyroidism 09/30/2015  . Hypogonadism in male 09/30/2015  . Diabetic neuropathy ([place]) 09/30/2015  . Chronic pain of left knee 09/30/2015  . Benign essential hypertension 09/30/2015  . Acute on chronic kidney  failure ([place]) 05/14/2015  . Diarrhea 05/14/2015  . Dehydration 05/14/2015  . Hypokalemia 05/14/2015  . 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 Pelvic mass in male   . Varices, esophageal ([place])   . Colonic mass   . Abnormal CT scan, pelvis   . Bladder mass   . Elevated liver enzymes   . Elevated LFTs   . Abdominal pain 07/23/2014  . Chronic kidney disease Stage IV 07/23/2014  . Morbid obesity ([place]) 07/23/2014  . Type 2 diabetes mellitus with stage 4 chronic kidney disease ([place]) 07/23/2014  . Hypertension 07/23/2014  . S/P total knee replacement, 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    Orientation RESPIRATION BLADDER Height & Weight     Self, Time, Situation, Place  Normal Continent Weight: 296 lb 8.3 oz (134.5 kg) Height:  6\' 1"  (185.4 cm)  BEHAVIORAL SYMPTOMS/MOOD NEUROLOGICAL BOWEL NUTRITION STATUS      Incontinent Diet (Room service appropriate? Yes; Fluid consistency: Thin)  AMBULATORY STATUS COMMUNICATION OF NEEDS Skin   Extensive Assist Verbally Normal, Other (Comment) (Eczema)                       Personal Care Assistance Level of Assistance  Bathing, Feeding, Dressing Bathing Assistance: Limited assistance Feeding assistance: Independent Dressing Assistance: Maximum assistance     Functional Limitations Info  Sight, Hearing, Speech Sight Info: Adequate Hearing Info: Adequate Speech Info: Adequate    SPECIAL CARE FACTORS FREQUENCY  PT (By licensed PT)     PT Frequency: 5x              Contractures Contractures Info: Not present    Additional Factors Info  Isolation Precautions Code Status Info: Full Allergies Info: n/a     Isolation Precautions Info: COVID + on 06/24/20     Current Medications (06/26/2020):  This is the current hospital active medication list Current Facility-Administered Medications  Medication Dose Route Frequency Provider Last Rate Last Admin  . 0.9 %  sodium chloride infusion   Intravenous Continuous Johnson, Clanford L, MD 30 mL/hr  at 06/26/20 1400 Rate Verify at 06/26/20 1400  . acetaminophen (TYLENOL) tablet 650 mg  650 mg Oral Q6H PRN Reubin Milan, MD   650 mg at 06/26/20 0849   Or  . acetaminophen (TYLENOL) suppository 650 mg  650 mg Rectal Q6H PRN Reubin Milan, MD      . albuterol (VENTOLIN HFA) 108 (90 Base) MCG/ACT inhaler 2 puff  2 puff Inhalation Q4H PRN Johnson, Clanford L, MD      . amLODipine (NORVASC) tablet 10 mg  10 mg Oral Daily Johnson, Clanford L, MD   10 mg at 06/26/20 0951  . ascorbic acid (VITAMIN C) tablet 500 mg  500 mg Oral Daily Reubin Milan, MD   500 mg at 06/26/20 0951  . aspirin EC tablet 81 mg  81 mg Oral Q breakfast Johnson, Clanford L, MD   81 mg at 06/26/20 0849  . Chlorhexidine Gluconate Cloth 2 % PADS 6 each  6 each Topical Daily Wynetta Emery, Clanford L, MD   6 each at 06/24/20 1530  . chlorpheniramine-HYDROcodone (TUSSIONEX) 10-8 MG/5ML suspension 5 mL  5 mL Oral Q12H PRN Reubin Milan, MD      . dexamethasone (DECADRON) injection 6 mg  6 mg Intravenous Q24H Reubin Milan, MD   6 mg at 06/26/20 5102  . guaiFENesin-dextromethorphan (ROBITUSSIN DM) 100-10 MG/5ML syrup 10 mL  10 mL Oral Q4H PRN Reubin Milan, MD      . hydrALAZINE (APRESOLINE) injection 10 mg  10 mg Intravenous Q4H PRN Johnson, Clanford L, MD      . insulin aspart (novoLOG) injection 0-15 Units  0-15 Units Subcutaneous TID WC Reubin Milan, MD   8 Units at 06/26/20 1329  . insulin detemir (LEVEMIR) injection 10 Units  10 Units Subcutaneous BID Murlean Iba, MD   10 Units at 06/26/20 0951  . Ipratropium-Albuterol (COMBIVENT) respimat 1 puff  1 puff Inhalation TID Wynetta Emery, Clanford L, MD   1 puff at 06/26/20 1341  . isosorbide mononitrate (IMDUR) 24 hr tablet 30 mg  30 mg Oral Daily Johnson, Clanford L, MD   30 mg at 06/26/20 0952  . levothyroxine (SYNTHROID) tablet 300 mcg  300 mcg Oral QAC breakfast Wynetta Emery, Clanford L, MD   300 mcg at 06/26/20 0527  . linagliptin (TRADJENTA)  tablet 5 mg  5 mg Oral Daily Reubin Milan, MD   5 mg at 06/26/20 0951  . mometasone-formoterol (DULERA) 100-5 MCG/ACT inhaler 2 puff  2 puff Inhalation BID Wynetta Emery, Clanford L, MD   2 puff at 06/26/20 0829  . nitroGLYCERIN (NITROSTAT) SL tablet 0.4 mg  0.4 mg Sublingual Q5 min PRN Wynetta Emery, Clanford L, MD      . ondansetron (ZOFRAN) tablet 4 mg  4 mg Oral Q6H PRN Reubin Milan, MD       Or  . ondansetron [facility]) injection 4 mg  4 mg Intravenous Q6H PRN Reubin Milan, MD      . remdesivir 100 mg in sodium chloride 0.9 % 100 mL IVPB  100 mg Intravenous Daily Reubin Milan, MD  Stopped at 06/26/20 1015  . simvastatin (ZOCOR) tablet 5 mg  5 mg Oral q1800 Johnson, Clanford L, MD   5 mg at 06/25/20 1729  . zinc sulfate capsule 220 mg  220 mg Oral Daily Reubin Milan, MD   220 mg at 06/26/20 1941     Discharge Medications: Please see discharge summary for a list of discharge medications.  Relevant Imaging Results:  Relevant Lab Results:   Additional Information PT SSN:  [id number]  Ihor Gully, LCSW

## 2020-06-26 NOTE — Evaluation (Signed)
Physical Therapy Evaluation Patient Details Name: [name] [name] MRN: [id number] DOB: 02/09/1946 Today's Date: 06/26/2020   History of Present Illness  [name] [name] is a 74 y.o. male with medical history significant of CAD, history of NSTEMI/history of stent placement in 2018, LBBB, stage IV CKD, hypertension, history of pneumonia, hyperlipidemia, hypothyroidism, class III obesity, history of non-Hodgkin's lymphoma with chemo and radiotherapy, sleep apnea, peripheral neuropathy, type 2 diabetes mellitus who is brought to the emergency department due to progressively weakness and confusion since developing persisting cough after apparently aspirating water while drinking.  He has been having multiple episodes of diarrhea in the past day.  No abdominal pain, nausea, vomiting, melena or hematochezia.  He denies fever, chills, night sweats, but feels fatigued.  He denies chest pain, palpitations, diaphoresis, PND, orthopnea or pitting edema of the lower extremities.  No dysuria, frequency or hematuria.  No polyuria, polydipsia, polyphagia or blurred vision.    Clinical Impression  Patient require multiple attempt before able to complete sit to stand due to BLE weakness, unsteady on feet having to lean over RW for support, limited mostly due to c/o fatigue, on room air with SpO2 dropping from 94% to 85% after walking, put back on 2 LPM with SpO2 increasing above 90% once seated in chair.  Patient tolerated sitting up in chair after therapy - RN notified.  Patient will benefit from continued physical therapy in hospital and recommended venue below to increase strength, balance, endurance for safe ADLs and gait.     Follow Up Recommendations SNF    Equipment Recommendations  None recommended by PT    Recommendations for Other Services       Precautions / Restrictions Precautions Precautions: Fall Restrictions Weight Bearing Restrictions: No      Mobility  Bed Mobility Overal bed mobility:  Needs Assistance Bed Mobility: Supine to Sit     Supine to sit: Supervision;Min guard     General bed mobility comments: increased time, labored movement    Transfers Overall transfer level: Needs assistance Equipment used: Rolling walker (2 wheeled) Transfers: Sit to/from [facility] Sit to Stand: Min assist;Mod assist Stand pivot transfers: Min assist       General transfer comment: required repeated attempts before able to complete sit to stand due to BLE weakness  Ambulation/Gait Ambulation/Gait assistance: Min assist Gait Distance (Feet): 15 Feet Assistive device: Rolling walker (2 wheeled) Gait Pattern/deviations: Decreased step length - right;Decreased step length - left;Decreased stride length;Trunk flexed Gait velocity: decreased   General Gait Details: slow labored cadence having to lean over RW for support due to weakness, limited mostly due to c/o fatigue, on room air with SpO2 dropping from 94% to 85%  Stairs            Wheelchair Mobility    Modified Rankin (Stroke Patients Only)       Balance Overall balance assessment: Needs assistance Sitting-balance support: Feet supported;No upper extremity supported Sitting balance-Leahy Scale: Fair Sitting balance - Comments: fair/good seated at EOB   Standing balance support: During functional activity;Bilateral upper extremity supported Standing balance-Leahy Scale: Fair Standing balance comment: using RW                             Pertinent Vitals/Pain Pain Assessment: No/denies pain    Home Living Family/patient expects to be discharged to:: Private residence Living Arrangements: Children Available Help at Discharge: Family;Available 24 hours/day Type of Home: [place]  Access: Ramped entrance     Home Layout: One level Home Equipment: Shower seat;Bedside commode;Walker - 2 wheels;Cane - single point;Toilet riser      Prior Function Level of Independence:  Needs assistance   Gait / Transfers Assistance Needed: household ambulator using RW most of time, has to lean on nearby objects when using SPC, uses 2 LPM O2 PRN at home  ADL's / Homemaking Assistance Needed: assisted by family for community ADLs        Hand Dominance   Dominant Hand: Right    Extremity/Trunk Assessment   Upper Extremity Assessment Upper Extremity Assessment: Generalized weakness    Lower Extremity Assessment Lower Extremity Assessment: Generalized weakness    Cervical / Trunk Assessment Cervical / Trunk Assessment: Kyphotic  Communication   Communication: No difficulties  Cognition Arousal/Alertness: Awake/alert Behavior During Therapy: WFL for tasks assessed/performed Overall Cognitive Status: Within Functional Limits for tasks assessed                                        General Comments      Exercises     Assessment/Plan    PT Assessment Patient needs continued PT services  PT Problem List Decreased strength;Decreased activity tolerance;Decreased balance;Decreased mobility       PT Treatment Interventions DME instruction;Gait training;Stair training;Functional mobility training;Therapeutic activities;Therapeutic exercise;Patient/family education    PT Goals (Current goals can be found in the Care Plan section)  Acute Rehab PT Goals Patient Stated Goal: return home after rehab PT Goal Formulation: With patient Time For Goal Achievement: 07/10/20 Potential to Achieve Goals: Good    Frequency Min 3X/week   Barriers to discharge        Co-evaluation               AM-PAC PT "6 Clicks" Mobility  Outcome Measure Help needed turning from your back to your side while in a flat bed without using bedrails?: None Help needed moving from lying on your back to sitting on the side of a flat bed without using bedrails?: A Little Help needed moving to and from a bed to a chair (including a wheelchair)?: A Little Help  needed standing up from a chair using your arms (e.g., wheelchair or bedside chair)?: A Lot Help needed to walk in hospital room?: A Lot Help needed climbing 3-5 steps with a railing? : A Lot 6 Click Score: 16    End of Session Equipment Utilized During Treatment: Oxygen Activity Tolerance: Patient tolerated treatment well;Patient limited by fatigue Patient left: in chair;with call bell/phone within reach Nurse Communication: Mobility status PT Visit Diagnosis: Unsteadiness on feet (R26.81);Other abnormalities of gait and mobility (R26.89);Muscle weakness (generalized) (M62.81)    Time: 1610-9604 PT Time Calculation (min) (ACUTE ONLY): 33 min   Charges:   PT Evaluation $PT Eval Moderate Complexity: 1 Mod PT Treatments $Therapeutic Activity: 23-37 mins        11:51 AM, 06/26/20 [name], MPT Physical Therapist with [facility] [phone] office [phone] mobile phone

## 2020-06-27 DIAGNOSIS — J9621 Acute and chronic respiratory failure with hypoxia: Secondary | ICD-10-CM | POA: Diagnosis not present

## 2020-06-27 DIAGNOSIS — U071 COVID-19: Secondary | ICD-10-CM | POA: Diagnosis not present

## 2020-06-27 DIAGNOSIS — E1122 Type 2 diabetes mellitus with diabetic chronic kidney disease: Secondary | ICD-10-CM | POA: Diagnosis not present

## 2020-06-27 DIAGNOSIS — J690 Pneumonitis due to inhalation of food and vomit: Secondary | ICD-10-CM | POA: Diagnosis not present

## 2020-06-27 LAB — CBC WITH DIFFERENTIAL/PLATELET
Abs Immature Granulocytes: 0.05 10*3/uL (ref 0.00–0.07)
Basophils Absolute: 0 10*3/uL (ref 0.0–0.1)
Basophils Relative: 0 %
Eosinophils Absolute: 0 10*3/uL (ref 0.0–0.5)
Eosinophils Relative: 0 %
HCT: 46.1 % (ref 39.0–52.0)
Hemoglobin: 14.8 g/dL (ref 13.0–17.0)
Immature Granulocytes: 1 %
Lymphocytes Relative: 29 %
Lymphs Abs: 1.2 10*3/uL (ref 0.7–4.0)
MCH: 28.8 pg (ref 26.0–34.0)
MCHC: 32.1 g/dL (ref 30.0–36.0)
MCV: 89.7 fL (ref 80.0–100.0)
Monocytes Absolute: 0.4 10*3/uL (ref 0.1–1.0)
Monocytes Relative: 8 %
Neutro Abs: 2.7 10*3/uL (ref 1.7–7.7)
Neutrophils Relative %: 62 %
Platelets: 108 10*3/uL — ABNORMAL LOW (ref 150–400)
RBC: 5.14 MIL/uL (ref 4.22–5.81)
RDW: 14.6 % (ref 11.5–15.5)
WBC: 4.3 10*3/uL (ref 4.0–10.5)
nRBC: 0 % (ref 0.0–0.2)

## 2020-06-27 LAB — D-DIMER, QUANTITATIVE: D-Dimer, Quant: 1.63 ug/mL-FEU — ABNORMAL HIGH (ref 0.00–0.50)

## 2020-06-27 LAB — COMPREHENSIVE METABOLIC PANEL
ALT: 102 U/L — ABNORMAL HIGH (ref 0–44)
AST: 144 U/L — ABNORMAL HIGH (ref 15–41)
Albumin: 3.1 g/dL — ABNORMAL LOW (ref 3.5–5.0)
Alkaline Phosphatase: 69 U/L (ref 38–126)
Anion gap: 9 (ref 5–15)
BUN: 51 mg/dL — ABNORMAL HIGH (ref 8–23)
CO2: 17 mmol/L — ABNORMAL LOW (ref 22–32)
Calcium: 7.9 mg/dL — ABNORMAL LOW (ref 8.9–10.3)
Chloride: 111 mmol/L (ref 98–111)
Creatinine, Ser: 2.5 mg/dL — ABNORMAL HIGH (ref 0.61–1.24)
GFR, Estimated: 26 mL/min — ABNORMAL LOW (ref >60)
Glucose, Bld: 127 mg/dL — ABNORMAL HIGH (ref 70–99)
Potassium: 3.8 mmol/L (ref 3.5–5.1)
Sodium: 137 mmol/L (ref 135–145)
Total Bilirubin: 0.4 mg/dL (ref 0.3–1.2)
Total Protein: 6.2 g/dL — ABNORMAL LOW (ref 6.5–8.1)

## 2020-06-27 LAB — GLUCOSE, CAPILLARY
Glucose-Capillary: 97 mg/dL (ref 70–99)
Glucose-Capillary: 231 mg/dL — ABNORMAL HIGH (ref 70–99)
Glucose-Capillary: 275 mg/dL — ABNORMAL HIGH (ref 70–99)
Glucose-Capillary: 243 mg/dL — ABNORMAL HIGH (ref 70–99)

## 2020-06-27 LAB — VITAMIN B12: Vitamin B-12: 525 pg/mL (ref 180–914)

## 2020-06-27 LAB — T4, FREE: Free T4: 1.02 ng/dL (ref 0.61–1.12)

## 2020-06-27 LAB — TSH: TSH: 2.213 u[IU]/mL (ref 0.350–4.500)

## 2020-06-27 LAB — FERRITIN: Ferritin: 653 ng/mL — ABNORMAL HIGH (ref 24–336)

## 2020-06-27 LAB — C-REACTIVE PROTEIN: CRP: 2.8 mg/dL — ABNORMAL HIGH (ref <1.0)

## 2020-06-27 LAB — FOLATE: Folate: 7 ng/mL (ref >5.9)

## 2020-06-27 LAB — MAGNESIUM: Magnesium: 2.2 mg/dL (ref 1.7–2.4)

## 2020-06-27 MED ORDER — POTASSIUM CHLORIDE CRYS ER 20 MEQ PO TBCR
20.0000 meq | EXTENDED_RELEASE_TABLET | Freq: Once | ORAL | Status: AC
  Administered 2020-06-27: 20 meq via ORAL
  Filled 2020-06-27: qty 1

## 2020-06-27 MED ORDER — SALINE SPRAY 0.65 % NA SOLN
1.0000 | NASAL | Status: DC | PRN
  Administered 2020-06-27: 1 via NASAL
  Filled 2020-06-27: qty 44

## 2020-06-27 NOTE — Progress Notes (Signed)
Report given to 300 nurse.  Patient take via wheelchair to room.

## 2020-06-27 NOTE — Progress Notes (Signed)
PROGRESS NOTE  [name] [name] WCH:[id number] DOB: 1946-04-10 DOA: 06/24/2020 PCP: [name]  Brief History:  74 y.o.malewith medical history significant ofCAD, history of NSTEMI/history of stent placement in 2018, LBBB, stage IV CKD, hypertension, history of pneumonia, hyperlipidemia, hypothyroidism, class III obesity, history of non-Hodgkin's lymphoma with chemo and radiotherapy, sleep apnea, peripheral neuropathy, type 2 diabetes mellitus who is brought to the emergency department due toprogressively weakness and confusion since developing persisting cough after apparently aspirating water while drinking. He has been having multiple episodes of diarrhea in the past day. No abdominal pain, nausea, vomiting, melena or hematochezia. He denies fever, chills, night sweats, but feels fatigued. He denies chest pain, palpitations, diaphoresis, PND, orthopnea or pitting edema of the lower extremities. No dysuria, frequency or hematuria. No polyuria, polydipsia, polyphagia or blurred vision.He was admitted with Covid pneumonia and concern for aspiration pneumonia. This was witnessed by a family member who said he choked after drinking some water and then began to have respiratory distress afterwards.   Assessment/Plan: Acute respiratory Failure due to COVID-19 -initially placed on 4L -now stable on 2L -continue remdesivir -changed to IV solumedrol -CRP 11.3>>8.6>>5.0>>2.8 -D-Dimer 1.75>>1.45>>1.47>COVID-19 Vaccine Information can be found at: [email or web address] For questions related to vaccine distribution or appointments, please email [email or web address] or call [phone].  .63 -Ferritin [phone] -wean oxygen as tolerated -prone positioning as tolerated  Aspiration Pneumonitis -evaluated by speech--regular with thin -initially on unasyn -PCT<0.10--d/c abx -now stable  off abx  CKD stage 4 -baseline creatinine 2.3-2.6 -am BMP -judicious IVF  Diabetes mellitus type 2 -11/22 A1C--5.8 -elevated CBG due to steroids -added novolog 4 units with meals -increased levemir 15 unit bid  Acute metabolic Encephalopathy -due to dehydration and COVID-19 -improving  CAD -no chest pain -continue ASA, imdur, zocor -holding bisoprolol due to bradycardia  Essential HTN -continue amlodipine and imdur  Hypothyroidism -continue synthroid  Hypokalemia -replete -check mag--2.2 -improving  Thrombocytopenia -B12--525 -folate--7.0 -TSH--2.213 -due to viral infection        Status is: Inpatient  Remains inpatient appropriate because:IV treatments appropriate due to intensity of illness or inability to take PO   Dispo:             Patient From: Home             Planned Disposition: SNF             Expected discharge date: 06/27/20             Medically stable for discharge: No         Family Communication:  no Family at bedside  Consultants:  none  Code Status:  FULL / DNR  DVT Prophylaxis:  SCDs   Procedures: As Listed in Progress Note Above  Antibiotics: None   Subjective: Patient denies fevers, chills, headache, chest pain, dyspnea, nausea, vomiting, diarrhea, abdominal pain, dysuria, hematuria, hematochezia, and melena. Still having some dyspnea on exertion  Objective: Vitals:   06/27/20 0100 06/27/20 0400 06/27/20 0838 06/27/20 1500  BP: (!) 145/53  128/89 (!) 162/92  Pulse: (!) 51 72 72 (!) 58  Resp: 16 (!) 21 20 18   Temp:  98.1 F (36.7 C) (!) 97.5 F (36.4 C) 97.8 F (36.6 C)  TempSrc:   Oral Oral  SpO2: 97% 94% 99% 96%  Weight:      Height:        Intake/Output Summary (Last 24 hours) at 06/27/2020 1757 Last data filed at  06/27/2020 1404 Gross per 24 hour  Intake 580 ml  Output 2650 ml  Net -2070 ml   Weight change:  Exam:   General:  Pt is alert, follows  commands appropriately, not in acute distress  HEENT: No icterus, No thrush, No neck mass, [place]/AT  Cardiovascular: RRR, S1/S2, no rubs, no gallops  Respiratory: bibasilar rales. No wheeze  Abdomen: Soft/+BS, non tender, non distended, no guarding  Extremities: No edema, No lymphangitis, No petechiae, No rashes, no synovitis   Data Reviewed: I have personally reviewed following labs and imaging studies Basic Metabolic Panel: Recent Labs  Lab 06/24/20 0029 06/24/20 0839 06/25/20 0607 06/26/20 0453 06/27/20 0435  NA 136 137 137 136 137  K 3.9 3.6 4.1 3.3* 3.8  CL 105 107 107 108 111  CO2 21* 21* 20* 17* 17*  GLUCOSE 139* 95 151* 245* 127*  BUN 41* 43* 51* 54* 51*  CREATININE 2.89* 2.83* 2.84* 2.61* 2.50*  CALCIUM 8.4* 8.0* 8.2* 7.9* 7.9*  MG 2.3  --   --   --  2.2  PHOS 2.7  --   --   --   --    Liver Function Tests: Recent Labs  Lab 06/24/20 0029 06/25/20 0607 06/26/20 0453 06/27/20 0435  AST 40 29 33 144*  ALT 25 23 27  102*  ALKPHOS 75 66 71 69  BILITOT 1.0 0.4 0.4 0.4  PROT 7.7 6.7 6.3* 6.2*  ALBUMIN 4.0 3.2* 3.0* 3.1*   No results for input(s): LIPASE, AMYLASE in the last 168 hours. No results for input(s): AMMONIA in the last 168 hours. Coagulation Profile: Recent Labs  Lab 06/24/20 0029  INR 1.2   CBC: Recent Labs  Lab 06/24/20 0029 06/24/20 0839 06/25/20 0607 06/26/20 0453 06/27/20 0435  WBC 3.9* 4.0 3.1* 3.8* 4.3  NEUTROABS 2.6 2.4 1.9 2.4 2.7  HGB 15.7 15.3 14.9 14.2 14.8  HCT 49.6 47.6 45.7 44.1 46.1  MCV 91.5 91.7 90.0 90.2 89.7  PLT 101* 89* 96* 100* 108*   Cardiac Enzymes: No results for input(s): CKTOTAL, CKMB, CKMBINDEX, TROPONINI in the last 168 hours. BNP: Invalid input(s): POCBNP CBG: Recent Labs  Lab 06/26/20 1241 06/26/20 1609 06/26/20 2207 06/27/20 0806 06/27/20 1134  GLUCAP 261* 350* 155* 97 231*   HbA1C: Recent Labs    06/24/20 1919  HGBA1C 5.8*   Urine analysis:    Component Value Date/Time   COLORURINE  YELLOW 06/24/2020 0220   APPEARANCEUR CLEAR 06/24/2020 0220   LABSPEC 1.015 06/24/2020 0220   PHURINE 7.0 06/24/2020 0220   GLUCOSEU 50 (A) 06/24/2020 0220   HGBUR NEGATIVE 06/24/2020 0220   BILIRUBINUR NEGATIVE 06/24/2020 0220   KETONESUR NEGATIVE 06/24/2020 0220   PROTEINUR >=300 (A) 06/24/2020 0220   UROBILINOGEN 4.0 (H) 07/23/2014 1356   NITRITE NEGATIVE 06/24/2020 0220   LEUKOCYTESUR NEGATIVE 06/24/2020 0220   Sepsis Labs: @LABRCNTIP (procalcitonin:4,lacticidven:4) ) Recent Results (from the past 240 hour(s))  Blood culture (routine single)     Status: None (Preliminary result)   Collection Time: 06/24/20  1:31 AM   Specimen: BLOOD RIGHT HAND  Result Value Ref Range Status   Specimen Description BLOOD RIGHT HAND  Final   Special Requests   Final    BOTTLES DRAWN AEROBIC AND ANAEROBIC Blood Culture adequate volume   Culture   Final    NO GROWTH 3 DAYS Performed at [facility], [address] [place], [place] [address]    Report Status PENDING  Incomplete  Urine culture     Status:  None   Collection Time: 06/24/20  2:20 AM   Specimen: In/Out Cath Urine  Result Value Ref Range Status   Specimen Description   Final    IN/OUT CATH URINE Performed at [facility], [address], [place], [place] [address]    Special Requests   Final    NONE Performed at [facility], [address], [place], [place] [address]    Culture   Final    NO GROWTH Performed at [facility], [place] [address], [place], [place] [address]    Report Status 06/25/2020 FINAL  Final  Resp Panel by RT-PCR (Flu A&B, Covid) Nasopharyngeal Swab     Status: Abnormal   Collection Time: 06/24/20  2:46 AM   Specimen: Nasopharyngeal Swab; Nasopharyngeal(NP) swabs in vial transport medium  Result Value Ref Range Status   SARS Coronavirus 2 by RT PCR POSITIVE (A) NEGATIVE Final    Comment: RESULT CALLED TO, READ BACK BY AND VERIFIED WITH: [place],C @ 1194 ON 06/24/20 BY JUW (NOTE) SARS-CoV-2 target  nucleic acids are DETECTED.  The SARS-CoV-2 RNA is generally detectable in upper respiratory specimens during the acute phase of infection. Positive results are indicative of the presence of the identified virus, but do not rule out bacterial infection or co-infection with other pathogens not detected by the test. Clinical correlation with patient history and other diagnostic information is necessary to determine patient infection status. The expected result is Negative.  Fact Sheet for Patients: EntrepreneurPulse.com.au  Fact Sheet for Healthcare Providers: IncredibleEmployment.be  This test is not yet approved or cleared by the [place] FDA and  has been authorized for detection and/or diagnosis of SARS-CoV-2 by FDA under an Emergency Use Authorization (EUA).  This EUA will remain in effect (meaning this test c an be used) for the duration of  the COVID-19 declaration under Section 564(b)(1) of the Act, 21 U.S.C. section 360bbb-3(b)(1), unless the authorization is terminated or revoked sooner.     Influenza A by PCR NEGATIVE NEGATIVE Final   Influenza B by PCR NEGATIVE NEGATIVE Final    Comment: (NOTE) The Xpert Xpress SARS-CoV-2/FLU/RSV plus assay is intended as an aid in the diagnosis of influenza from Nasopharyngeal swab specimens and should not be used as a sole basis for treatment. Nasal washings and aspirates are unacceptable for Xpert Xpress SARS-CoV-2/FLU/RSV testing.  Fact Sheet for Patients: EntrepreneurPulse.com.au  Fact Sheet for Healthcare Providers: IncredibleEmployment.be  This test is not yet approved or cleared by the [place] FDA and has been authorized for detection and/or diagnosis of SARS-CoV-2 by FDA under an Emergency Use Authorization (EUA). This EUA will remain in effect (meaning this test can be used) for the duration of the COVID-19 declaration under Section  564(b)(1) of the Act, 21 U.S.C. section 360bbb-3(b)(1), unless the authorization is terminated or revoked.  Performed at [facility], [address], [place], [place] [address]   MRSA PCR Screening     Status: None   Collection Time: 06/24/20  9:17 AM   Specimen: Nasal Mucosa; Nasopharyngeal  Result Value Ref Range Status   MRSA by PCR NEGATIVE NEGATIVE Final    Comment:        The GeneXpert MRSA Assay (FDA approved for NASAL specimens only), is one component of a comprehensive MRSA colonization surveillance program. It is not intended to diagnose MRSA infection nor to guide or monitor treatment for MRSA infections. Performed at [facility], [address] [place], [place] [address]      Scheduled  Meds: . amLODipine  10 mg Oral Daily  . vitamin C  500 mg Oral Daily  . aspirin EC  81 mg Oral Q breakfast  . Chlorhexidine Gluconate Cloth  6 each Topical Daily  . insulin aspart  0-15 Units Subcutaneous TID WC  . insulin aspart  4 Units Subcutaneous TID WC  . insulin detemir  15 Units Subcutaneous BID  . Ipratropium-Albuterol  1 puff Inhalation TID  . isosorbide mononitrate  30 mg Oral Daily  . levothyroxine  300 mcg Oral QAC breakfast  . linagliptin  5 mg Oral Daily  . methylPREDNISolone (SOLU-MEDROL) injection  60 mg Intravenous Q12H  . mometasone-formoterol  2 puff Inhalation BID  . simvastatin  5 mg Oral q1800  . zinc sulfate  220 mg Oral Daily   Continuous Infusions: . sodium chloride 30 mL/hr at 06/27/20 1005  . remdesivir 100 mg in NS 100 mL 100 mg (06/27/20 1035)    Procedures/Studies: DG Chest Port 1 View  Result Date: 06/24/2020 CLINICAL DATA:  Weakness, lethargy, and confusion after choking episode a couple of days ago. EXAM: PORTABLE CHEST 1 VIEW COMPARISON:  01/10/2019 FINDINGS: Mild cardiac enlargement. Mild pulmonary vascular congestion. No airspace disease or consolidation. No pleural effusions. No pneumothorax. Mediastinal contours appear intact.  IMPRESSION: Mild cardiac enlargement and pulmonary vascular congestion. No edema or consolidation. Electronically Signed   By: Lucienne Capers M.D.   On: 06/24/2020 01:37    Orson Eva, DO  [facility]  If 7PM-7AM, please contact night-coverage [email or web address] Password [facility] 06/27/2020, 5:57 PM   LOS: 2 days

## 2020-06-28 DIAGNOSIS — U071 COVID-19: Secondary | ICD-10-CM | POA: Diagnosis not present

## 2020-06-28 DIAGNOSIS — J9621 Acute and chronic respiratory failure with hypoxia: Secondary | ICD-10-CM | POA: Diagnosis not present

## 2020-06-28 DIAGNOSIS — J690 Pneumonitis due to inhalation of food and vomit: Secondary | ICD-10-CM | POA: Diagnosis not present

## 2020-06-28 DIAGNOSIS — E1122 Type 2 diabetes mellitus with diabetic chronic kidney disease: Secondary | ICD-10-CM | POA: Diagnosis not present

## 2020-06-28 LAB — CBC WITH DIFFERENTIAL/PLATELET
Abs Immature Granulocytes: 0.06 10*3/uL (ref 0.00–0.07)
Basophils Absolute: 0 10*3/uL (ref 0.0–0.1)
Basophils Relative: 0 %
Eosinophils Absolute: 0 10*3/uL (ref 0.0–0.5)
Eosinophils Relative: 0 %
HCT: 47.9 % (ref 39.0–52.0)
Hemoglobin: 15.4 g/dL (ref 13.0–17.0)
Immature Granulocytes: 2 %
Lymphocytes Relative: 20 %
Lymphs Abs: 0.8 10*3/uL (ref 0.7–4.0)
MCH: 28.7 pg (ref 26.0–34.0)
MCHC: 32.2 g/dL (ref 30.0–36.0)
MCV: 89.2 fL (ref 80.0–100.0)
Monocytes Absolute: 0.2 10*3/uL (ref 0.1–1.0)
Monocytes Relative: 4 %
Neutro Abs: 2.9 10*3/uL (ref 1.7–7.7)
Neutrophils Relative %: 74 %
Platelets: 113 10*3/uL — ABNORMAL LOW (ref 150–400)
RBC: 5.37 MIL/uL (ref 4.22–5.81)
RDW: 14.9 % (ref 11.5–15.5)
WBC: 4 10*3/uL (ref 4.0–10.5)
nRBC: 0 % (ref 0.0–0.2)

## 2020-06-28 LAB — GLUCOSE, CAPILLARY
Glucose-Capillary: 219 mg/dL — ABNORMAL HIGH (ref 70–99)
Glucose-Capillary: 207 mg/dL — ABNORMAL HIGH (ref 70–99)
Glucose-Capillary: 254 mg/dL — ABNORMAL HIGH (ref 70–99)
Glucose-Capillary: 363 mg/dL — ABNORMAL HIGH (ref 70–99)
Glucose-Capillary: 306 mg/dL — ABNORMAL HIGH (ref 70–99)

## 2020-06-28 LAB — COMPREHENSIVE METABOLIC PANEL
ALT: 110 U/L — ABNORMAL HIGH (ref 0–44)
AST: 78 U/L — ABNORMAL HIGH (ref 15–41)
Albumin: 3.5 g/dL (ref 3.5–5.0)
Alkaline Phosphatase: 78 U/L (ref 38–126)
Anion gap: 10 (ref 5–15)
BUN: 51 mg/dL — ABNORMAL HIGH (ref 8–23)
CO2: 19 mmol/L — ABNORMAL LOW (ref 22–32)
Calcium: 8.2 mg/dL — ABNORMAL LOW (ref 8.9–10.3)
Chloride: 109 mmol/L (ref 98–111)
Creatinine, Ser: 2.23 mg/dL — ABNORMAL HIGH (ref 0.61–1.24)
GFR, Estimated: 30 mL/min — ABNORMAL LOW (ref >60)
Glucose, Bld: 231 mg/dL — ABNORMAL HIGH (ref 70–99)
Potassium: 4.4 mmol/L (ref 3.5–5.1)
Sodium: 138 mmol/L (ref 135–145)
Total Bilirubin: 0.8 mg/dL (ref 0.3–1.2)
Total Protein: 6.7 g/dL (ref 6.5–8.1)

## 2020-06-28 LAB — FERRITIN: Ferritin: 479 ng/mL — ABNORMAL HIGH (ref 24–336)

## 2020-06-28 LAB — C-REACTIVE PROTEIN: CRP: 1.2 mg/dL — ABNORMAL HIGH (ref <1.0)

## 2020-06-28 LAB — D-DIMER, QUANTITATIVE: D-Dimer, Quant: 1.17 ug/mL-FEU — ABNORMAL HIGH (ref 0.00–0.50)

## 2020-06-28 MED ORDER — INSULIN ASPART 100 UNIT/ML ~~LOC~~ SOLN
8.0000 [IU] | Freq: Three times a day (TID) | SUBCUTANEOUS | Status: DC
  Administered 2020-06-29 (×2): 8 [IU] via SUBCUTANEOUS

## 2020-06-28 NOTE — Progress Notes (Signed)
Physical Therapy Treatment Patient Details Name: [name] [name] MRN: [id number] DOB: 12-28-1945 Today's Date: 06/28/2020    History of Present Illness [name] [name] is a 74 y.o. male with medical history significant of CAD, history of NSTEMI/history of stent placement in 2018, LBBB, stage IV CKD, hypertension, history of pneumonia, hyperlipidemia, hypothyroidism, class III obesity, history of non-Hodgkin's lymphoma with chemo and radiotherapy, sleep apnea, peripheral neuropathy, type 2 diabetes mellitus who is brought to the emergency department due to progressively weakness and confusion since developing persisting cough after apparently aspirating water while drinking.  He has been having multiple episodes of diarrhea in the past day.  No abdominal pain, nausea, vomiting, melena or hematochezia.  He denies fever, chills, night sweats, but feels fatigued.  He denies chest pain, palpitations, diaphoresis, PND, orthopnea or pitting edema of the lower extremities.  No dysuria, frequency or hematuria.  No polyuria, polydipsia, polyphagia or blurred vision.    PT Comments    Patient seated in chair at beginning of session. Patient completes seated exercises with minimal verbal cueing for mechanics. Patient transfers to standing with RW requiring assist secondary to weakness. Patient attempting to pull up pants while standing but loses balance posteriorly falling back into chair. Patient transfers to standing again with use of RW and ambulates into bathroom where he transfers to and from the toilet requiring assist secondary weakness. Patient ambulates with slow, unsteady cadence with use of RW. Patient ended session seated in chair. Patient will benefit from continued physical therapy in hospital and recommended venue below to increase strength, balance, endurance for safe ADLs and gait.   Follow Up Recommendations  SNF     Equipment Recommendations  None recommended by PT    Recommendations for  Other Services       Precautions / Restrictions Precautions Precautions: Fall Restrictions Weight Bearing Restrictions: No    Mobility  Bed Mobility               General bed mobility comments: seated in chair at beginning of session  Transfers Overall transfer level: Needs assistance Equipment used: Rolling walker (2 wheeled) Transfers: Sit to/from [employer] Transfers Sit to Stand: Min assist;Mod assist Stand pivot transfers: Min assist;Mod assist       General transfer comment: Patient requires min/mod assist to transfer to standing with RW, patient attempted to put pants on initially upon standing but fell back into chair secondary to impaired balance  Ambulation/Gait Ambulation/Gait assistance: Min assist Gait Distance (Feet): 15 Feet Assistive device: Rolling walker (2 wheeled) Gait Pattern/deviations: Decreased step length - right;Decreased step length - left;Decreased stride length;Trunk flexed Gait velocity: decreased   General Gait Details: slow, labored unsteady cadence with RW   Stairs             Wheelchair Mobility    Modified Rankin (Stroke Patients Only)       Balance Overall balance assessment: Needs assistance Sitting-balance support: Feet supported;No upper extremity supported Sitting balance-Leahy Scale: Fair Sitting balance - Comments: fair/good seated at EOB   Standing balance support: During functional activity;Bilateral upper extremity supported Standing balance-Leahy Scale: Poor Standing balance comment: using RW                            Cognition Arousal/Alertness: Awake/alert Behavior During Therapy: WFL for tasks assessed/performed Overall Cognitive Status: Within Functional Limits for tasks assessed  Exercises General Exercises - Lower Extremity Long Arc Quad: AROM;10 reps;Seated;Both Hip Flexion/Marching: AROM;Both;10 reps;Seated Toe  Raises: AROM;Both;10 reps;Seated Heel Raises: AROM;Both;10 reps;Seated    General Comments        Pertinent Vitals/Pain Pain Assessment: No/denies pain    Home Living                      Prior Function            PT Goals (current goals can now be found in the care plan section) Acute Rehab PT Goals Patient Stated Goal: return home after rehab PT Goal Formulation: With patient Time For Goal Achievement: 07/10/20 Potential to Achieve Goals: Good Progress towards PT goals: Progressing toward goals    Frequency    Min 3X/week      PT Plan Current plan remains appropriate    Co-evaluation              AM-PAC PT "6 Clicks" Mobility   Outcome Measure  Help needed turning from your back to your side while in a flat bed without using bedrails?: None Help needed moving from lying on your back to sitting on the side of a flat bed without using bedrails?: A Little Help needed moving to and from a bed to a chair (including a wheelchair)?: A Little Help needed standing up from a chair using your arms (e.g., wheelchair or bedside chair)?: A Lot Help needed to walk in hospital room?: A Lot Help needed climbing 3-5 steps with a railing? : A Lot 6 Click Score: 16    End of Session Equipment Utilized During Treatment: Oxygen Activity Tolerance: Patient tolerated treatment well;Patient limited by fatigue Patient left: in chair;with call bell/phone within reach Nurse Communication: Mobility status PT Visit Diagnosis: Unsteadiness on feet (R26.81);Other abnormalities of gait and mobility (R26.89);Muscle weakness (generalized) (M62.81)     Time: 5945-8592 PT Time Calculation (min) (ACUTE ONLY): 22 min  Charges:  $Therapeutic Activity: 8-22 mins                     11:22 AM, 06/28/20 Mearl Latin PT, DPT Physical Therapist at [facility]

## 2020-06-28 NOTE — Progress Notes (Signed)
PROGRESS NOTE  [name] [name] ERX:[id number] DOB: 11-22-1945 DOA: 06/24/2020 PCP: [name]  Brief History: 74 y.o.malewith medical history significant ofCAD, history of NSTEMI/history of stent placement in 2018, LBBB, stage IV CKD, hypertension, history of pneumonia, hyperlipidemia, hypothyroidism, class III obesity, history of non-Hodgkin's lymphoma with chemo and radiotherapy, sleep apnea, peripheral neuropathy, type 2 diabetes mellitus who is brought to the emergency department due toprogressively weakness and confusion since developing persisting cough after apparently aspirating water while drinking. He has been having multiple episodes of diarrhea in the past day. No abdominal pain, nausea, vomiting, melena or hematochezia. He denies fever, chills, night sweats, but feels fatigued. He denies chest pain, palpitations, diaphoresis, PND, orthopnea or pitting edema of the lower extremities. No dysuria, frequency or hematuria. No polyuria, polydipsia, polyphagia or blurred vision.He was admitted with Covid pneumonia and concern for aspiration pneumonia. This was witnessed by a family member who said he choked after drinking some water and then began to have respiratory distress afterwards.  Assessment/Plan: Acute respiratory Failure due to COVID-19 -initially placed on 4L -now stable on 2L -continue remdesivir -changed to IV solumedrol -CRP 11.3>>8.6>>5.0>>2.8>>1.2 -D-Dimer 1.75>>1.45>>1.47>1.63 -Ferritin 495>>524>>436>>653>>479 -wean oxygen as tolerated -prone positioning as tolerated  Aspiration Pneumonitis -evaluated by speech--regular with thin -initially on unasyn -PCT<0.10--d/c abx -now stable off abx  CKD stage 4 -baseline creatinine 2.3-2.6 -am BMP -judicious IVF  Diabetes mellitus type 2 -11/22 A1C--5.8 -elevated CBG due to steroids -added novolog 8 units with meals -increased levemir 15 unit bid  Acute  metabolic Encephalopathy -due to dehydration and COVID-19 -improving  CAD -no chest pain -continue ASA, imdur, zocor -holding bisoprolol due to bradycardia  Essential HTN -continue amlodipine and imdur  Hypothyroidism -continue synthroid  Hypokalemia -replete -check mag--2.2 -improving  Thrombocytopenia -B12--525 -folate--7.0 -TSH--2.213 -due to viral infection        Status is: Inpatient  Remains inpatient appropriate because:IV treatments appropriate due to intensity of illness or inability to take PO   Dispo: Patient From: Home Planned Disposition: SNF Expected discharge date: 06/27/20 Medically stable for discharge: No         Family Communication:noFamily at bedside  Consultants:none  Code Status: FULL   DVT Prophylaxis: SCDs   Procedures: As Listed in Progress Note Above  Antibiotics: None     Subjective: Patient denies fevers, chills, headache, chest pain, dyspnea, nausea, vomiting, diarrhea, abdominal pain, dysuria, hematuria, hematochezia, and melena.   Objective: Vitals:   06/28/20 0719 06/28/20 0900 06/28/20 1233 06/28/20 1544  BP:  (!) 158/80  (!) 152/78  Pulse:  62  74  Resp:  18  19  Temp:    98.5 F (36.9 C)  TempSrc:    Oral  SpO2: 94% 96% 96% 95%  Weight:      Height:        Intake/Output Summary (Last 24 hours) at 06/28/2020 1815 Last data filed at 06/28/2020 0920 Gross per 24 hour  Intake 120 ml  Output 1500 ml  Net -1380 ml   Weight change:  Exam:   General:  Pt is alert, follows commands appropriately, not in acute distress  HEENT: No icterus, No thrush, No neck mass, [place]/AT  Cardiovascular: RRR, S1/S2, no rubs, no gallops  Respiratory: CTA bilaterally, no wheezing, no crackles, no rhonchi  Abdomen: Soft/+BS, non tender, non distended, no guarding  Extremities: No edema, No lymphangitis, No petechiae, No  rashes, no synovitis   Data Reviewed: I have  personally reviewed following labs and imaging studies Basic Metabolic Panel: Recent Labs  Lab 06/24/20 0029 06/24/20 0029 06/24/20 0839 06/25/20 0607 06/26/20 0453 06/27/20 0435 06/28/20 0752  NA 136   < > 137 137 136 137 138  K 3.9   < > 3.6 4.1 3.3* 3.8 4.4  CL 105   < > 107 107 108 111 109  CO2 21*   < > 21* 20* 17* 17* 19*  GLUCOSE 139*   < > 95 151* 245* 127* 231*  BUN 41*   < > 43* 51* 54* 51* 51*  CREATININE 2.89*   < > 2.83* 2.84* 2.61* 2.50* 2.23*  CALCIUM 8.4*   < > 8.0* 8.2* 7.9* 7.9* 8.2*  MG 2.3  --   --   --   --  2.2  --   PHOS 2.7  --   --   --   --   --   --    < > = values in this interval not displayed.   Liver Function Tests: Recent Labs  Lab 06/24/20 0029 06/25/20 0607 06/26/20 0453 06/27/20 0435 06/28/20 0752  AST 40 29 33 144* 78*  ALT 25 23 27  102* 110*  ALKPHOS 75 66 71 69 78  BILITOT 1.0 0.4 0.4 0.4 0.8  PROT 7.7 6.7 6.3* 6.2* 6.7  ALBUMIN 4.0 3.2* 3.0* 3.1* 3.5   No results for input(s): LIPASE, AMYLASE in the last 168 hours. No results for input(s): AMMONIA in the last 168 hours. Coagulation Profile: Recent Labs  Lab 06/24/20 0029  INR 1.2   CBC: Recent Labs  Lab 06/24/20 0839 06/25/20 0607 06/26/20 0453 06/27/20 0435 06/28/20 0752  WBC 4.0 3.1* 3.8* 4.3 4.0  NEUTROABS 2.4 1.9 2.4 2.7 2.9  HGB 15.3 14.9 14.2 14.8 15.4  HCT 47.6 45.7 44.1 46.1 47.9  MCV 91.7 90.0 90.2 89.7 89.2  PLT 89* 96* 100* 108* 113*   Cardiac Enzymes: No results for input(s): CKTOTAL, CKMB, CKMBINDEX, TROPONINI in the last 168 hours. BNP: Invalid input(s): POCBNP CBG: Recent Labs  Lab 06/27/20 2136 06/28/20 0306 06/28/20 0751 06/28/20 1136 06/28/20 1724  GLUCAP 243* 219* 207* 254* 363*   HbA1C: No results for input(s): HGBA1C in the last 72 hours. Urine analysis:    Component Value Date/Time   COLORURINE YELLOW 06/24/2020 0220   APPEARANCEUR CLEAR 06/24/2020 0220   LABSPEC 1.015  06/24/2020 0220   PHURINE 7.0 06/24/2020 0220   GLUCOSEU 50 (A) 06/24/2020 0220   HGBUR NEGATIVE 06/24/2020 0220   BILIRUBINUR NEGATIVE 06/24/2020 0220   KETONESUR NEGATIVE 06/24/2020 0220   PROTEINUR >=300 (A) 06/24/2020 0220   UROBILINOGEN 4.0 (H) 07/23/2014 1356   NITRITE NEGATIVE 06/24/2020 0220   LEUKOCYTESUR NEGATIVE 06/24/2020 0220   Sepsis Labs: @LABRCNTIP (procalcitonin:4,lacticidven:4) ) Recent Results (from the past 240 hour(s))  Blood culture (routine single)     Status: None (Preliminary result)   Collection Time: 06/24/20  1:31 AM   Specimen: BLOOD RIGHT HAND  Result Value Ref Range Status   Specimen Description BLOOD RIGHT HAND  Final   Special Requests   Final    BOTTLES DRAWN AEROBIC AND ANAEROBIC Blood Culture adequate volume   Culture   Final    NO GROWTH 4 DAYS Performed at [facility], [address] [place], [place] [address]    Report Status PENDING  Incomplete  Urine culture     Status: None   Collection Time: 06/24/20  2:20 AM   Specimen: In/Out Cath Urine  Result  Value Ref Range Status   Specimen Description   Final    IN/OUT CATH URINE Performed at [facility], [address], [place], [place] [address]    Special Requests   Final    NONE Performed at [facility], [address], [place], [place] [address]    Culture   Final    NO GROWTH Performed at [facility], [place] [address], [place], [place] [address]    Report Status 06/25/2020 FINAL  Final  Resp Panel by RT-PCR (Flu A&B, Covid) Nasopharyngeal Swab     Status: Abnormal   Collection Time: 06/24/20  2:46 AM   Specimen: Nasopharyngeal Swab; Nasopharyngeal(NP) swabs in vial transport medium  Result Value Ref Range Status   SARS Coronavirus 2 by RT PCR POSITIVE (A) NEGATIVE Final    Comment: RESULT CALLED TO, READ BACK BY AND VERIFIED WITH: [place],C @ 1696 ON 06/24/20 BY JUW (NOTE) SARS-CoV-2 target nucleic acids are DETECTED.  The SARS-CoV-2 RNA is generally detectable in  upper respiratory specimens during the acute phase of infection. Positive results are indicative of the presence of the identified virus, but do not rule out bacterial infection or co-infection with other pathogens not detected by the test. Clinical correlation with patient history and other diagnostic information is necessary to determine patient infection status. The expected result is Negative.  Fact Sheet for Patients: EntrepreneurPulse.com.au  Fact Sheet for Healthcare Providers: IncredibleEmployment.be  This test is not yet approved or cleared by the [place] FDA and  has been authorized for detection and/or diagnosis of SARS-CoV-2 by FDA under an Emergency Use Authorization (EUA).  This EUA will remain in effect (meaning this test c an be used) for the duration of  the COVID-19 declaration under Section 564(b)(1) of the Act, 21 U.S.C. section 360bbb-3(b)(1), unless the authorization is terminated or revoked sooner.     Influenza A by PCR NEGATIVE NEGATIVE Final   Influenza B by PCR NEGATIVE NEGATIVE Final    Comment: (NOTE) The Xpert Xpress SARS-CoV-2/FLU/RSV plus assay is intended as an aid in the diagnosis of influenza from Nasopharyngeal swab specimens and should not be used as a sole basis for treatment. Nasal washings and aspirates are unacceptable for Xpert Xpress SARS-CoV-2/FLU/RSV testing.  Fact Sheet for Patients: EntrepreneurPulse.com.au  Fact Sheet for Healthcare Providers: IncredibleEmployment.be  This test is not yet approved or cleared by the [place] FDA and has been authorized for detection and/or diagnosis of SARS-CoV-2 by FDA under an Emergency Use Authorization (EUA). This EUA will remain in effect (meaning this test can be used) for the duration of the COVID-19 declaration under Section 564(b)(1) of the Act, 21 U.S.C. section 360bbb-3(b)(1), unless the authorization is  terminated or revoked.  Performed at [facility], [address] [place], [place] [address]   MRSA PCR Screening     Status: None   Collection Time: 06/24/20  9:17 AM   Specimen: Nasal Mucosa; Nasopharyngeal  Result Value Ref Range Status   MRSA by PCR NEGATIVE NEGATIVE Final    Comment:        The GeneXpert MRSA Assay (FDA approved for NASAL specimens only), is one component of a comprehensive MRSA colonization surveillance program. It is not intended to diagnose MRSA infection nor to guide or monitor treatment for MRSA infections. Performed at [facility], [address], [place], [place] [address]      Scheduled Meds: . amLODipine  10 mg Oral Daily  . vitamin C  500 mg Oral Daily  .  aspirin EC  81 mg Oral Q breakfast  . Chlorhexidine Gluconate Cloth  6 each Topical Daily  . insulin aspart  0-15 Units Subcutaneous TID WC  . insulin aspart  4 Units Subcutaneous TID WC  . insulin detemir  15 Units Subcutaneous BID  . Ipratropium-Albuterol  1 puff Inhalation TID  . isosorbide mononitrate  30 mg Oral Daily  . levothyroxine  300 mcg Oral QAC breakfast  . linagliptin  5 mg Oral Daily  . methylPREDNISolone (SOLU-MEDROL) injection  60 mg Intravenous Q12H  . mometasone-formoterol  2 puff Inhalation BID  . simvastatin  5 mg Oral q1800  . zinc sulfate  220 mg Oral Daily   Continuous Infusions: . sodium chloride Stopped (06/27/20 1825)    Procedures/Studies: DG Chest Port 1 View  Result Date: 06/24/2020 CLINICAL DATA:  Weakness, lethargy, and confusion after choking episode a couple of days ago. EXAM: PORTABLE CHEST 1 VIEW COMPARISON:  01/10/2019 FINDINGS: Mild cardiac enlargement. Mild pulmonary vascular congestion. No airspace disease or consolidation. No pleural effusions. No pneumothorax. Mediastinal contours appear intact. IMPRESSION: Mild cardiac enlargement and pulmonary vascular congestion. No edema or consolidation. Electronically Signed   By: Lucienne Capers M.D.    On: 06/24/2020 01:37    Orson Eva, DO  [facility]  If 7PM-7AM, please contact night-coverage [email or web address] Password [email or web address] 06/28/2020, 6:15 PM   LOS: 3 days

## 2020-06-28 NOTE — TOC Progression Note (Signed)
Transition of Care [facility]) - Progression Note   Patient Details  Name: [name] [name] MRN: [id number] Date of Birth: 1945/10/20  [facility]) CM/SW [place], LCSW Phone Number: 06/28/2020, 11:52 AM  Clinical Narrative: Patient does not currently have any SNF bed offers. CSW left voicemails for admissions with [facility] and [facility]. CSW received return call from [place] with [facility] and the facility is no longer accepting COVID patients. CSW called Jonelle Sidle [phone]) in admissions with the [facility] in [place]. Per Tiffany, the facility's COVID unit was closed down last week as there were not enough patients to keep it open. CSW also referred patient to [facility] in the hub. CSW received call from [facility]. [facility] will not have bed availability until Monday (11/29) at the earliest. [facility] to review referral. TOC to follow.  Expected Discharge Plan: [place] Barriers to Discharge: Continued Medical Work up  Expected Discharge Plan and Services Expected Discharge Plan: [place] arrangements for the past 2 months: Single Family Home Representative spoke with at DME Agency: N/A Representative spoke with at [place]: N/A  Readmission Risk Interventions Readmission Risk Prevention Plan 06/26/2020  Transportation Screening Complete  Medication Review Press photographer) Complete  PCP or Specialist appointment within 3-5 days of discharge Complete  HRI or Home Care Consult Complete  SW Recovery Care/Counseling Consult Complete  Palliative Care Screening Not [place] Complete  Some recent data might be hidden

## 2020-06-28 NOTE — Progress Notes (Signed)
Patient has been refusing CPAP. 

## 2020-06-28 NOTE — Progress Notes (Signed)
Spot checked patients oxygen saturation while pt slept, patients oxygen level was 93% on room air.

## 2020-06-29 DIAGNOSIS — J9621 Acute and chronic respiratory failure with hypoxia: Secondary | ICD-10-CM | POA: Diagnosis not present

## 2020-06-29 DIAGNOSIS — U071 COVID-19: Secondary | ICD-10-CM | POA: Diagnosis not present

## 2020-06-29 DIAGNOSIS — J690 Pneumonitis due to inhalation of food and vomit: Secondary | ICD-10-CM | POA: Diagnosis not present

## 2020-06-29 DIAGNOSIS — E1122 Type 2 diabetes mellitus with diabetic chronic kidney disease: Secondary | ICD-10-CM | POA: Diagnosis not present

## 2020-06-29 LAB — COMPREHENSIVE METABOLIC PANEL
ALT: 168 U/L — ABNORMAL HIGH (ref 0–44)
AST: 138 U/L — ABNORMAL HIGH (ref 15–41)
Albumin: 3.8 g/dL (ref 3.5–5.0)
Alkaline Phosphatase: 80 U/L (ref 38–126)
Anion gap: 10 (ref 5–15)
BUN: 53 mg/dL — ABNORMAL HIGH (ref 8–23)
CO2: 21 mmol/L — ABNORMAL LOW (ref 22–32)
Calcium: 8.7 mg/dL — ABNORMAL LOW (ref 8.9–10.3)
Chloride: 107 mmol/L (ref 98–111)
Creatinine, Ser: 2.08 mg/dL — ABNORMAL HIGH (ref 0.61–1.24)
GFR, Estimated: 33 mL/min — ABNORMAL LOW (ref >60)
Glucose, Bld: 189 mg/dL — ABNORMAL HIGH (ref 70–99)
Potassium: 4.1 mmol/L (ref 3.5–5.1)
Sodium: 138 mmol/L (ref 135–145)
Total Bilirubin: 1 mg/dL (ref 0.3–1.2)
Total Protein: 7.1 g/dL (ref 6.5–8.1)

## 2020-06-29 LAB — CBC WITH DIFFERENTIAL/PLATELET
Abs Immature Granulocytes: 0.12 10*3/uL — ABNORMAL HIGH (ref 0.00–0.07)
Basophils Absolute: 0 10*3/uL (ref 0.0–0.1)
Basophils Relative: 0 %
Eosinophils Absolute: 0 10*3/uL (ref 0.0–0.5)
Eosinophils Relative: 0 %
HCT: 50.5 % (ref 39.0–52.0)
Hemoglobin: 16.7 g/dL (ref 13.0–17.0)
Immature Granulocytes: 2 %
Lymphocytes Relative: 16 %
Lymphs Abs: 1 10*3/uL (ref 0.7–4.0)
MCH: 29.2 pg (ref 26.0–34.0)
MCHC: 33.1 g/dL (ref 30.0–36.0)
MCV: 88.4 fL (ref 80.0–100.0)
Monocytes Absolute: 0.3 10*3/uL (ref 0.1–1.0)
Monocytes Relative: 5 %
Neutro Abs: 5.2 10*3/uL (ref 1.7–7.7)
Neutrophils Relative %: 77 %
Platelets: 152 10*3/uL (ref 150–400)
RBC: 5.71 MIL/uL (ref 4.22–5.81)
RDW: 15.9 % — ABNORMAL HIGH (ref 11.5–15.5)
WBC: 6.6 10*3/uL (ref 4.0–10.5)
nRBC: 0.3 % — ABNORMAL HIGH (ref 0.0–0.2)

## 2020-06-29 LAB — CULTURE, BLOOD (SINGLE)
Culture: NO GROWTH
Special Requests: ADEQUATE

## 2020-06-29 LAB — GLUCOSE, CAPILLARY
Glucose-Capillary: 161 mg/dL — ABNORMAL HIGH (ref 70–99)
Glucose-Capillary: 271 mg/dL — ABNORMAL HIGH (ref 70–99)

## 2020-06-29 LAB — D-DIMER, QUANTITATIVE: D-Dimer, Quant: 1.22 ug/mL-FEU — ABNORMAL HIGH (ref 0.00–0.50)

## 2020-06-29 LAB — C-REACTIVE PROTEIN: CRP: 0.8 mg/dL (ref <1.0)

## 2020-06-29 LAB — FERRITIN: Ferritin: 651 ng/mL — ABNORMAL HIGH (ref 24–336)

## 2020-06-29 NOTE — Discharge Summary (Signed)
Physician Discharge Summary  [name] [name] ZOX:[id number] DOB: 29-Nov-1945 DOA: 06/24/2020  PCP: [name], [place] date: 06/24/2020 Discharge date: 06/29/2020  Admitted From: HOME Disposition:  AGAINST MEDICAL ADVICE      Discharge Condition:  AGAINST MEDICAL ADVICE CODE STATUS: FULL    Brief/Interim Summary: 74 y.o.malewith medical history significant ofCAD, history of NSTEMI/history of stent placement in 2018, LBBB, stage IV CKD, hypertension, history of pneumonia, hyperlipidemia, hypothyroidism, class III obesity, history of non-Hodgkin's lymphoma with chemo and radiotherapy, sleep apnea, peripheral neuropathy, type 2 diabetes mellitus who is brought to the emergency department due toprogressively weakness and confusion since developing persisting cough after apparently aspirating water while drinking. He has been having multiple episodes of diarrhea in the past day. No abdominal pain, nausea, vomiting, melena or hematochezia. He denies fever, chills, night sweats, but feels fatigued. He denies chest pain, palpitations, diaphoresis, PND, orthopnea or pitting edema of the lower extremities. No dysuria, frequency or hematuria. No polyuria, polydipsia, polyphagia or blurred vision.He was admitted with Covid pneumonia and concern for aspiration pneumonia. This was witnessed by a family member who said he choked after drinking some water and then began to have respiratory distress afterwards.  Discharge Diagnoses:  Acute respiratory Failure due to COVID-19 -initially placed on 4L -now stable on2L>>RA -finished remdesivir 11/26 -changed to IV solumedrol -CRP 11.3>>8.6>>5.0>>2.8>>1.2 -D-Dimer 1.75>>1.45>>1.47>1.63 -Ferritin 495>>524>>436>>653>>479 -wean oxygen as tolerated -prone positioning as tolerated  Aspiration Pneumonitis -evaluated by speech--regular with thin -initially on unasyn -PCT<0.10--d/c abx -now stable off abx  CKD  stage 4 -baseline creatinine 2.3-2.6 -serum creatinine 2.08 on day of d/c -judicious IVF  Diabetes mellitus type 2 -11/22 A1C--5.8 -elevated CBG due to steroids -addednovolog 8 units with meals -increasedlevemir 15 unit bid -restart [place] after d/c  Acute metabolic Encephalopathy -due to dehydration and COVID-19 -improving  CAD -no chest pain -continue ASA, imdur, zocor -holding bisoprolol due to bradycardia  Essential HTN -continue amlodipine and imdur  Hypothyroidism -continue synthroid  Hypokalemia -replete -check mag--2.2 -improving  Thrombocytopenia -B12--525 -folate--7.0 -TSH--2.213 -due to viral infection -improving  Deconditioning -PT-recommended SNF -patient now refuses SNF, wants to go home -I spoke with patient's daughter and sister both of whom state that his home is not a safe situation for d/c -I discussed with patient that it would be unsafe for me to discharge him home given PT eval results and that he lives alone and his family cannot help him -He has capacity to make decisions, A&O x 4 In speaking with Cleda Mccreedy, Cleda Mccreedy has demonstrated the ability to understand his medical condition(s) which include COVID 19 pneumonia, deconditioning, respiratory failure.  [name] [name] has demonstrated the ability to appreciate how treatment for COVID 19 pneumonia, deconditioning, respiratory failure. will be beneficial.   [name] [name] has also demonstrated the ability to understand and appreciate how refusal of treatement forCOVID 19 pneumonia, deconditioning, respiratory failure.  could result in harm, repeat hospitalization, and possibly death.  [name] [name] demonstrates the ability to reason through the risks and benefits of the proposed treatment.  Finally, [name] [name] is able to clearly communicate his/her choice.    Discharge Instructions   Allergies as of 06/29/2020   No Known Allergies     Medication List    STOP taking  these medications   losartan 100 MG tablet Commonly known as: COZAAR   ondansetron 4 MG disintegrating tablet Commonly known as: Zofran ODT     TAKE these medications  acetaminophen 325 MG tablet Commonly known as: TYLENOL Take 2 tablets (650 mg total) by mouth every 6 (six) hours as needed for mild pain (or Fever >/= 101).   albuterol 108 (90 Base) MCG/ACT inhaler Commonly known as: VENTOLIN HFA Inhale 1-2 puffs into the lungs every 6 (six) hours as needed for wheezing or shortness of breath.   amLODipine 10 MG tablet Commonly known as: NORVASC Take 10 mg by mouth daily.   aspirin EC 81 MG tablet Take 1 tablet (81 mg total) by mouth daily with breakfast. Swallow whole.   bisoprolol 5 MG tablet Commonly known as: ZEBETA Take 2.5 mg by mouth daily.   fluticasone 50 MCG/ACT nasal spray Commonly known as: FLONASE Place 2 sprays into both nostrils daily for 14 days. What changed:   when to take this  reasons to take this   furosemide 40 MG tablet Commonly known as: LASIX Take 40 mg by mouth daily.   hydrALAZINE 25 MG tablet Commonly known as: APRESOLINE Take 1 tablet (25 mg total) by mouth 2 (two) times daily. What changed: when to take this   insulin aspart 100 UNIT/ML FlexPen Commonly known as: NOVOLOG See admin instructions.   insulin degludec 100 UNIT/ML FlexTouch Pen Commonly known as: Tyler Aas FlexTouch Inject 0.5 mLs (50 Units total) into the skin daily at 10 pm. What changed: how much to take   ipratropium-albuterol 0.5-2.5 (3) MG/3ML Soln Commonly known as: DUONEB Inhale 3 mLs into the lungs every 4 (four) hours as needed.   isosorbide mononitrate 30 MG 24 hr tablet Commonly known as: IMDUR Take 1 tablet (30 mg total) by mouth daily.   levocetirizine 5 MG tablet Commonly known as: XYZAL Take 5 mg by mouth every evening.   levothyroxine 200 MCG tablet Commonly known as: SYNTHROID Take 300 mcg by mouth daily before breakfast. (takes with 163mcg  for a total of 331mcg)   levothyroxine 100 MCG tablet Commonly known as: SYNTHROID Take 100 mcg by mouth daily. (takes with 258mcg for a total of 348mcg)   nitroGLYCERIN 0.4 MG SL tablet Commonly known as: NITROSTAT Place 0.4 mg under the tongue every 5 (five) minutes as needed for chest pain.   OXYGEN Inhale 2 L into the lungs daily as needed.   senna-docusate 8.6-50 MG tablet Commonly known as: Senokot-S Take 1 tablet by mouth daily as needed.   simvastatin 5 MG tablet Commonly known as: ZOCOR Take 5 mg by mouth daily.   Symbicort 160-4.5 MCG/ACT inhaler Generic drug: budesonide-formoterol Inhale 2 puffs into the lungs daily.       No Known Allergies  Consultations:  none   Procedures/Studies: DG Chest Port 1 View  Result Date: 06/24/2020 CLINICAL DATA:  Weakness, lethargy, and confusion after choking episode a couple of days ago. EXAM: PORTABLE CHEST 1 VIEW COMPARISON:  01/10/2019 FINDINGS: Mild cardiac enlargement. Mild pulmonary vascular congestion. No airspace disease or consolidation. No pleural effusions. No pneumothorax. Mediastinal contours appear intact. IMPRESSION: Mild cardiac enlargement and pulmonary vascular congestion. No edema or consolidation. Electronically Signed   By: Lucienne Capers M.D.   On: 06/24/2020 01:37        Discharge Exam: Vitals:   06/29/20 0830 06/29/20 0915  BP: (!) 156/86   Pulse: 76   Resp: 18   Temp:    SpO2: 96% 94%   Vitals:   06/29/20 0602 06/29/20 0802 06/29/20 0830 06/29/20 0915  BP: (!) 141/82  (!) 156/86   Pulse: 62  76   Resp: 18  18   Temp: 98.2 F (36.8 C)     TempSrc: Oral     SpO2: 95% 95% 96% 94%  Weight:      Height:        General: Pt is alert, awake, not in acute distress Cardiovascular: RRR, S1/S2 +, no rubs, no gallops Respiratory: CTA bilaterally, no wheezing, no rhonchi Abdominal: Soft, NT, ND, bowel sounds + Extremities: no edema, no cyanosis   The results of significant diagnostics  from this hospitalization (including imaging, microbiology, ancillary and laboratory) are listed below for reference.    Significant Diagnostic Studies: DG Chest Port 1 View  Result Date: 06/24/2020 CLINICAL DATA:  Weakness, lethargy, and confusion after choking episode a couple of days ago. EXAM: PORTABLE CHEST 1 VIEW COMPARISON:  01/10/2019 FINDINGS: Mild cardiac enlargement. Mild pulmonary vascular congestion. No airspace disease or consolidation. No pleural effusions. No pneumothorax. Mediastinal contours appear intact. IMPRESSION: Mild cardiac enlargement and pulmonary vascular congestion. No edema or consolidation. Electronically Signed   By: Lucienne Capers M.D.   On: 06/24/2020 01:37     Microbiology: Recent Results (from the past 240 hour(s))  Blood culture (routine single)     Status: None   Collection Time: 06/24/20  1:31 AM   Specimen: BLOOD RIGHT HAND  Result Value Ref Range Status   Specimen Description BLOOD RIGHT HAND  Final   Special Requests   Final    BOTTLES DRAWN AEROBIC AND ANAEROBIC Blood Culture adequate volume   Culture   Final    NO GROWTH 5 DAYS Performed at [facility], [address], [place], [place] [address]    Report Status 06/29/2020 FINAL  Final  Urine culture     Status: None   Collection Time: 06/24/20  2:20 AM   Specimen: In/Out Cath Urine  Result Value Ref Range Status   Specimen Description   Final    IN/OUT CATH URINE Performed at [facility], [address], [place], [place] [address]    Special Requests   Final    NONE Performed at [facility], [address] [place], [place] [address]    Culture   Final    NO GROWTH Performed at [facility], [place] [address] [place], [place] [address]    Report Status 06/25/2020 FINAL  Final  Resp Panel by RT-PCR (Flu A&B, Covid) Nasopharyngeal Swab     Status: Abnormal   Collection Time: 06/24/20  2:46 AM   Specimen: Nasopharyngeal Swab; Nasopharyngeal(NP) swabs in vial transport medium   Result Value Ref Range Status   SARS Coronavirus 2 by RT PCR POSITIVE (A) NEGATIVE Final    Comment: RESULT CALLED TO, READ BACK BY AND VERIFIED WITH: [place],C @ 8338 ON 06/24/20 BY JUW (NOTE) SARS-CoV-2 target nucleic acids are DETECTED.  The SARS-CoV-2 RNA is generally detectable in upper respiratory specimens during the acute phase of infection. Positive results are indicative of the presence of the identified virus, but do not rule out bacterial infection or co-infection with other pathogens not detected by the test. Clinical correlation with patient history and other diagnostic information is necessary to determine patient infection status. The expected result is Negative.  Fact Sheet for Patients: EntrepreneurPulse.com.au  Fact Sheet for Healthcare Providers: IncredibleEmployment.be  This test is not yet approved or cleared by the [place] FDA and  has been authorized for detection and/or diagnosis of SARS-CoV-2 by FDA under an Emergency Use Authorization (EUA).  This EUA will remain in effect (meaning this test c  an be used) for the duration of  the COVID-19 declaration under Section 564(b)(1) of the Act, 21 U.S.C. section 360bbb-3(b)(1), unless the authorization is terminated or revoked sooner.     Influenza A by PCR NEGATIVE NEGATIVE Final   Influenza B by PCR NEGATIVE NEGATIVE Final    Comment: (NOTE) The Xpert Xpress SARS-CoV-2/FLU/RSV plus assay is intended as an aid in the diagnosis of influenza from Nasopharyngeal swab specimens and should not be used as a sole basis for treatment. Nasal washings and aspirates are unacceptable for Xpert Xpress SARS-CoV-2/FLU/RSV testing.  Fact Sheet for Patients: EntrepreneurPulse.com.au  Fact Sheet for Healthcare Providers: IncredibleEmployment.be  This test is not yet approved or cleared by the [place] FDA and has been authorized for  detection and/or diagnosis of SARS-CoV-2 by FDA under an Emergency Use Authorization (EUA). This EUA will remain in effect (meaning this test can be used) for the duration of the COVID-19 declaration under Section 564(b)(1) of the Act, 21 U.S.C. section 360bbb-3(b)(1), unless the authorization is terminated or revoked.  Performed at [facility], [address] [place], [place] [address]   MRSA PCR Screening     Status: None   Collection Time: 06/24/20  9:17 AM   Specimen: Nasal Mucosa; Nasopharyngeal  Result Value Ref Range Status   MRSA by PCR NEGATIVE NEGATIVE Final    Comment:        The GeneXpert MRSA Assay (FDA approved for NASAL specimens only), is one component of a comprehensive MRSA colonization surveillance program. It is not intended to diagnose MRSA infection nor to guide or monitor treatment for MRSA infections. Performed at [facility], [address] [place], [place] [address]      Labs: Basic Metabolic Panel: Recent Labs  Lab 06/24/20 0029 06/24/20 9485 06/25/20 0607 06/25/20 0607 06/26/20 0453 06/26/20 0453 06/27/20 0435 06/27/20 0435 06/28/20 0752 06/29/20 0909  NA 136   < > 137  --  136  --  137  --  138 138  K 3.9   < > 4.1   < > 3.3*   < > 3.8   < > 4.4 4.1  CL 105   < > 107  --  108  --  111  --  109 107  CO2 21*   < > 20*  --  17*  --  17*  --  19* 21*  GLUCOSE 139*   < > 151*  --  245*  --  127*  --  231* 189*  BUN 41*   < > 51*  --  54*  --  51*  --  51* 53*  CREATININE 2.89*   < > 2.84*  --  2.61*  --  2.50*  --  2.23* 2.08*  CALCIUM 8.4*   < > 8.2*  --  7.9*  --  7.9*  --  8.2* 8.7*  MG 2.3  --   --   --   --   --  2.2  --   --   --   PHOS 2.7  --   --   --   --   --   --   --   --   --    < > = values in this interval not displayed.   Liver Function Tests: Recent Labs  Lab 06/25/20 0607 06/26/20 0453 06/27/20 0435 06/28/20 0752 06/29/20 0909  AST 29 33 144* 78* 138*  ALT 23 27 102* 110* 168*  ALKPHOS 66 71 69 78  80   BILITOT 0.4 0.4 0.4 0.8 1.0  PROT 6.7 6.3* 6.2* 6.7 7.1  ALBUMIN 3.2* 3.0* 3.1* 3.5 3.8   No results for input(s): LIPASE, AMYLASE in the last 168 hours. No results for input(s): AMMONIA in the last 168 hours. CBC: Recent Labs  Lab 06/25/20 0607 06/26/20 0453 06/27/20 0435 06/28/20 0752 06/29/20 0909  WBC 3.1* 3.8* 4.3 4.0 6.6  NEUTROABS 1.9 2.4 2.7 2.9 5.2  HGB 14.9 14.2 14.8 15.4 16.7  HCT 45.7 44.1 46.1 47.9 50.5  MCV 90.0 90.2 89.7 89.2 88.4  PLT 96* 100* 108* 113* 152   Cardiac Enzymes: No results for input(s): CKTOTAL, CKMB, CKMBINDEX, TROPONINI in the last 168 hours. BNP: Invalid input(s): POCBNP CBG: Recent Labs  Lab 06/28/20 1136 06/28/20 1724 06/28/20 1959 06/29/20 0322 06/29/20 1123  GLUCAP 254* 363* 306* 161* 271*    Time coordinating discharge:  36 minutes  Signed:  Orson Eva, DO [facility] Pager: [phone] 06/29/2020, 12:20 PM

## 2020-06-29 NOTE — Progress Notes (Signed)
Patient [name] [name] left AMA today at 14:50. Dr. Carles Collet was at the bedside this afternoon discussing discharge to SNF on Monday. Patient did not want to wait and wanted to go home. He is aware of the safety risks that both Dr. Carles Collet and I discussed with him. Patient signed the AMA form. Vital signs stable upon leaving. IV removed and Telemetry discontinued. Patient taken downstairs via wheelchair. Family is aware that he is leaving AMA. His son in law and daughter came to pick him up and take him home.

## 2020-06-29 NOTE — Progress Notes (Signed)
blood glucose 170 this morning at 0730. It did not transfer into the epic system from the glucometer. Will cover with insulin as ordered.

## 2020-07-01 LAB — GLUCOSE, CAPILLARY: Glucose-Capillary: 170 mg/dL — ABNORMAL HIGH (ref 70–99)

## 2020-07-19 ENCOUNTER — Inpatient Hospital Stay (HOSPITAL_COMMUNITY)
Admission: EM | Admit: 2020-07-19 | Discharge: 2020-07-20 | DRG: 291 | Discharge status: Against Medical Advice | Payer: Medicare Other | Attending: Family Medicine | Admitting: Family Medicine

## 2020-07-19 ENCOUNTER — Other Ambulatory Visit: Payer: Self-pay

## 2020-07-19 ENCOUNTER — Encounter (HOSPITAL_COMMUNITY): Payer: Self-pay

## 2020-07-19 ENCOUNTER — Emergency Department (HOSPITAL_COMMUNITY): Payer: Medicare Other

## 2020-07-19 DIAGNOSIS — Z955 Presence of coronary angioplasty implant and graft: Secondary | ICD-10-CM | POA: Diagnosis not present

## 2020-07-19 DIAGNOSIS — Z9049 Acquired absence of other specified parts of digestive tract: Secondary | ICD-10-CM | POA: Diagnosis not present

## 2020-07-19 DIAGNOSIS — Z8572 Personal history of non-Hodgkin lymphomas: Secondary | ICD-10-CM | POA: Diagnosis not present

## 2020-07-19 DIAGNOSIS — I252 Old myocardial infarction: Secondary | ICD-10-CM | POA: Diagnosis not present

## 2020-07-19 DIAGNOSIS — I5023 Acute on chronic systolic (congestive) heart failure: Secondary | ICD-10-CM

## 2020-07-19 DIAGNOSIS — Z794 Long term (current) use of insulin: Secondary | ICD-10-CM

## 2020-07-19 DIAGNOSIS — N184 Chronic kidney disease, stage 4 (severe): Secondary | ICD-10-CM | POA: Diagnosis not present

## 2020-07-19 DIAGNOSIS — I214 Non-ST elevation (NSTEMI) myocardial infarction: Secondary | ICD-10-CM

## 2020-07-19 DIAGNOSIS — I4891 Unspecified atrial fibrillation: Secondary | ICD-10-CM

## 2020-07-19 DIAGNOSIS — G4730 Sleep apnea, unspecified: Secondary | ICD-10-CM | POA: Diagnosis present

## 2020-07-19 DIAGNOSIS — U071 COVID-19: Secondary | ICD-10-CM

## 2020-07-19 DIAGNOSIS — I447 Left bundle-branch block, unspecified: Secondary | ICD-10-CM | POA: Diagnosis present

## 2020-07-19 DIAGNOSIS — Z9221 Personal history of antineoplastic chemotherapy: Secondary | ICD-10-CM | POA: Diagnosis not present

## 2020-07-19 DIAGNOSIS — R778 Other specified abnormalities of plasma proteins: Secondary | ICD-10-CM | POA: Diagnosis present

## 2020-07-19 DIAGNOSIS — I509 Heart failure, unspecified: Secondary | ICD-10-CM

## 2020-07-19 DIAGNOSIS — Z79890 Hormone replacement therapy (postmenopausal): Secondary | ICD-10-CM

## 2020-07-19 DIAGNOSIS — Z7982 Long term (current) use of aspirin: Secondary | ICD-10-CM | POA: Diagnosis not present

## 2020-07-19 DIAGNOSIS — I5033 Acute on chronic diastolic (congestive) heart failure: Secondary | ICD-10-CM | POA: Diagnosis not present

## 2020-07-19 DIAGNOSIS — I130 Hypertensive heart and chronic kidney disease with heart failure and stage 1 through stage 4 chronic kidney disease, or unspecified chronic kidney disease: Principal | ICD-10-CM | POA: Diagnosis present

## 2020-07-19 DIAGNOSIS — Z96653 Presence of artificial knee joint, bilateral: Secondary | ICD-10-CM | POA: Diagnosis not present

## 2020-07-19 DIAGNOSIS — E039 Hypothyroidism, unspecified: Secondary | ICD-10-CM | POA: Diagnosis present

## 2020-07-19 DIAGNOSIS — R7989 Other specified abnormal findings of blood chemistry: Secondary | ICD-10-CM | POA: Diagnosis present

## 2020-07-19 DIAGNOSIS — Z5329 Procedure and treatment not carried out because of patient's decision for other reasons: Secondary | ICD-10-CM | POA: Diagnosis present

## 2020-07-19 DIAGNOSIS — Z923 Personal history of irradiation: Secondary | ICD-10-CM

## 2020-07-19 DIAGNOSIS — Z9111 Patient's noncompliance with dietary regimen: Secondary | ICD-10-CM

## 2020-07-19 DIAGNOSIS — I480 Paroxysmal atrial fibrillation: Secondary | ICD-10-CM | POA: Diagnosis not present

## 2020-07-19 DIAGNOSIS — I2510 Atherosclerotic heart disease of native coronary artery without angina pectoris: Secondary | ICD-10-CM | POA: Diagnosis present

## 2020-07-19 DIAGNOSIS — I10 Essential (primary) hypertension: Secondary | ICD-10-CM

## 2020-07-19 DIAGNOSIS — J189 Pneumonia, unspecified organism: Secondary | ICD-10-CM | POA: Diagnosis not present

## 2020-07-19 DIAGNOSIS — E1122 Type 2 diabetes mellitus with diabetic chronic kidney disease: Secondary | ICD-10-CM | POA: Diagnosis present

## 2020-07-19 DIAGNOSIS — N189 Chronic kidney disease, unspecified: Secondary | ICD-10-CM | POA: Diagnosis present

## 2020-07-19 DIAGNOSIS — Z6841 Body Mass Index (BMI) 40.0 and over, adult: Secondary | ICD-10-CM

## 2020-07-19 DIAGNOSIS — E785 Hyperlipidemia, unspecified: Secondary | ICD-10-CM | POA: Diagnosis not present

## 2020-07-19 DIAGNOSIS — E1142 Type 2 diabetes mellitus with diabetic polyneuropathy: Secondary | ICD-10-CM | POA: Diagnosis present

## 2020-07-19 DIAGNOSIS — J1282 Pneumonia due to coronavirus disease 2019: Secondary | ICD-10-CM

## 2020-07-19 DIAGNOSIS — Z79899 Other long term (current) drug therapy: Secondary | ICD-10-CM

## 2020-07-19 DIAGNOSIS — U099 Post covid-19 condition, unspecified: Secondary | ICD-10-CM | POA: Diagnosis not present

## 2020-07-19 DIAGNOSIS — Z79891 Long term (current) use of opiate analgesic: Secondary | ICD-10-CM

## 2020-07-19 DIAGNOSIS — G4733 Obstructive sleep apnea (adult) (pediatric): Secondary | ICD-10-CM | POA: Diagnosis present

## 2020-07-19 DIAGNOSIS — Z7951 Long term (current) use of inhaled steroids: Secondary | ICD-10-CM

## 2020-07-19 DIAGNOSIS — F17210 Nicotine dependence, cigarettes, uncomplicated: Secondary | ICD-10-CM | POA: Diagnosis not present

## 2020-07-19 HISTORY — DX: COVID-19: U07.1

## 2020-07-19 LAB — BASIC METABOLIC PANEL
Anion gap: 9 (ref 5–15)
BUN: 34 mg/dL — ABNORMAL HIGH (ref 8–23)
CO2: 23 mmol/L (ref 22–32)
Calcium: 8.5 mg/dL — ABNORMAL LOW (ref 8.9–10.3)
Chloride: 104 mmol/L (ref 98–111)
Creatinine, Ser: 2.83 mg/dL — ABNORMAL HIGH (ref 0.61–1.24)
GFR, Estimated: 23 mL/min — ABNORMAL LOW (ref >60)
Glucose, Bld: 94 mg/dL (ref 70–99)
Potassium: 4 mmol/L (ref 3.5–5.1)
Sodium: 136 mmol/L (ref 135–145)

## 2020-07-19 LAB — URINALYSIS, ROUTINE W REFLEX MICROSCOPIC
Bilirubin Urine: NEGATIVE
Glucose, UA: NEGATIVE mg/dL
Hgb urine dipstick: NEGATIVE
Ketones, ur: NEGATIVE mg/dL
Leukocytes,Ua: NEGATIVE
Nitrite: NEGATIVE
Protein, ur: NEGATIVE mg/dL
Specific Gravity, Urine: 1.005 (ref 1.005–1.030)
pH: 7 (ref 5.0–8.0)

## 2020-07-19 LAB — TROPONIN I (HIGH SENSITIVITY): Troponin I (High Sensitivity): 19 ng/L — ABNORMAL HIGH (ref <18)

## 2020-07-19 LAB — CBC
HCT: 48.8 % (ref 39.0–52.0)
Hemoglobin: 15.2 g/dL (ref 13.0–17.0)
MCH: 29.1 pg (ref 26.0–34.0)
MCHC: 31.1 g/dL (ref 30.0–36.0)
MCV: 93.3 fL (ref 80.0–100.0)
Platelets: 112 10*3/uL — ABNORMAL LOW (ref 150–400)
RBC: 5.23 MIL/uL (ref 4.22–5.81)
RDW: 18 % — ABNORMAL HIGH (ref 11.5–15.5)
WBC: 6.3 10*3/uL (ref 4.0–10.5)

## 2020-07-19 LAB — BRAIN NATRIURETIC PEPTIDE: B Natriuretic Peptide: 182 pg/mL — ABNORMAL HIGH (ref 0.0–100.0)

## 2020-07-19 MED ORDER — ACETAMINOPHEN 325 MG PO TABS: 650.0000 mg | ORAL_TABLET | Freq: Four times a day (QID) | ORAL | Status: DC | PRN

## 2020-07-19 MED ORDER — ALBUTEROL SULFATE HFA 108 (90 BASE) MCG/ACT IN AERS
2.0000 | INHALATION_SPRAY | RESPIRATORY_TRACT | Status: DC | PRN
  Administered 2020-07-19: 18:00:00 2 via RESPIRATORY_TRACT
  Filled 2020-07-19: qty 6.7

## 2020-07-19 MED ORDER — ENOXAPARIN SODIUM 150 MG/ML ~~LOC~~ SOLN: 140.0000 mg | Freq: Once | SUBCUTANEOUS | Status: DC

## 2020-07-19 MED ORDER — FUROSEMIDE 10 MG/ML IJ SOLN: 40.0000 mg | Freq: Two times a day (BID) | INTRAMUSCULAR | Status: DC

## 2020-07-19 MED ORDER — ACETAMINOPHEN 650 MG RE SUPP: 650.0000 mg | Freq: Four times a day (QID) | RECTAL | Status: DC | PRN

## 2020-07-19 MED ORDER — FUROSEMIDE 10 MG/ML IJ SOLN
80.0000 mg | Freq: Once | INTRAMUSCULAR | Status: AC
  Administered 2020-07-19: 19:00:00 80 mg via INTRAVENOUS
  Filled 2020-07-19: qty 8

## 2020-07-19 NOTE — H&P (Signed)
History and Physical  [name] [name] DTO:[id number] DOB: 1945/12/13 DOA: 07/19/2020  Referring physician: Evalee Jefferson, PA-C PCP: [place], [facility]  Patient coming from: Home  Chief Complaint: Shortness of breath   HPI: [name] [name] is a 74 y.o. male with medical history significant for  CAD s/p stent placement (2018), history of NSTEMI, LBBB, stage IV CKD, hypertension, hyperlipidemia, hypothyroidism, class III obesity, history of non-Hodgkin's lymphoma with chemo and radiotherapy, sleep apnea, peripheral neuropathy, type 2 diabetes mellitus who presents to the emergency department due to 2 to 3-day onset of increased shortness of breath which worsens on exertion and increased leg swelling.   Patient was admitted and discharged from 11/22-11/27 (during which he left AMA) due to acute respiratory failure due to COVID-19 and aspiration pneumonitis. Patient states that he is on home oxygen via [place] at 3 LPM as needed, but states that he has been using the oxygen more frequently within the last 2 to 3 days.  He complains of chest congestion with difficulty in being able to bring up any sputum when coughing.  Patient has also been using home MDI with improvement in his wheezing.  He states that he called his PCP this afternoon and was asked to go to the ED for further evaluation and management.  Patient states that he has been compliant with his home Lasix, however, he has not been compliant with food intake which include snacks, canned food, pizza etc. Patient lives at home with his daughter and her family  ED Course:  In the emergency department, BP was 156/88 and O2 sat was 90-95% on supplemental oxygen at 4 LPM.  Work-up in the ED showed platelets 112, BNP of 182 (this was 157 on 11/22), troponin x1-19, BUN to creatinine 24/2.83 (creatinine within baseline range).  Chest x-ray showed multifocal pneumonia with bilateral trace pleural effusions.  IV Lasix 80 Mg x1 was given.   Hospitalist was asked to admit patient for further evaluation and management.  Review of Systems: Constitutional: Negative for chills and fever.  HENT: Negative for ear pain and sore throat.   Eyes: Negative for pain and visual disturbance.  Respiratory: Positive for cough,  wheezing and shortness of breath.   Cardiovascular: Negative for chest pain and palpitations.  Gastrointestinal: Negative for abdominal pain and vomiting.  Endocrine: Negative for polyphagia and polyuria.  Genitourinary: Negative for decreased urine volume, dysuria, enuresis Musculoskeletal: Positive for bilateral leg swelling.  Negative for arthralgias and back pain.  Skin: Negative for color change and rash.  Allergic/Immunologic: Negative for immunocompromised state.  Neurological: Negative for tremors, syncope, speech difficulty, weakness, light-headedness and headaches.  Hematological: Does not bruise/bleed easily.  All other systems reviewed and are negative  Past Medical History:  Diagnosis Date  . CAD (coronary artery disease)    DES to LAD June 2018  . CKD (chronic kidney disease) stage 3, GFR 30-59 ml/min (HCC)   . COVID   . Essential hypertension   . History of pneumonia   . Hyperlipidemia   . Hypothyroidism   . LBBB (left bundle branch block)   . Morbid obesity ([place])   . Non Hodgkin's lymphoma ([place])    Status post XRT and chemotherapy  . NSTEMI (non-ST elevated myocardial infarction) [facility])    June 2018  . Peripheral neuropathy   . Sleep apnea   . Type 2 diabetes mellitus ([place])    Past Surgical History:  Procedure Laterality Date  . CHOLECYSTECTOMY  1992  . COLONOSCOPY N/A  09/03/2014   SLF:six colon polyps removed/small internal hemorrhoids  . CORONARY STENT INTERVENTION N/A 01/21/2017   Procedure: Coronary Stent Intervention;  Surgeon: Nelva Bush, MD;  Location: [place] [facility];  Service: Cardiovascular;  Laterality: N/A;  . ESOPHAGOGASTRODUODENOSCOPY N/A 09/03/2014   SLF: mild  gastritis/few gastric polyps  . LEFT HEART CATH AND CORONARY ANGIOGRAPHY N/A 01/20/2017   Procedure: Left Heart Cath and Coronary Angiography;  Surgeon: Jettie Booze, MD;  Location: [place] [facility];  Service: Cardiovascular;  Laterality: N/A;  . TOTAL KNEE ARTHROPLASTY  11/10/2011   Procedure: TOTAL KNEE ARTHROPLASTY;  Surgeon: Mauri Pole, MD;  Location: [facility];  Service: Orthopedics;  Laterality: Right;  . TOTAL KNEE ARTHROPLASTY Left 05/24/2018   Procedure: LEFT TOTAL KNEE ARTHROPLASTY;  Surgeon: Melrose Nakayama, MD;  Location: [facility];  Service: Orthopedics;  Laterality: Left;    Social History:  reports that he has been smoking cigarettes. He has a 7.50 pack-year smoking history. He has never used smokeless tobacco. He reports that he does not drink alcohol and does not use drugs.   No Known Allergies  Family History  Problem Relation Age of Onset  . Cancer Mother        breast and lung  . Cancer Father        bladder  . Cancer Maternal Uncle        prostate  . Cancer Paternal Uncle        esophagus  . Colon cancer Neg Hx     Prior to Admission medications   Medication Sig Start Date End Date Taking? Authorizing Provider  acetaminophen (TYLENOL) 325 MG tablet Take 2 tablets (650 mg total) by mouth every 6 (six) hours as needed for mild pain (or Fever >/= 101). 02/01/20   Roxan Hockey, MD  albuterol (VENTOLIN HFA) 108 (90 Base) MCG/ACT inhaler Inhale 1-2 puffs into the lungs every 6 (six) hours as needed for wheezing or shortness of breath. 01/10/19   Long, Wonda Olds, MD  amLODipine (NORVASC) 10 MG tablet Take 10 mg by mouth daily.    [provider]  aspirin EC 81 MG tablet Take 1 tablet (81 mg total) by mouth daily with breakfast. Swallow whole. 02/01/20   Roxan Hockey, MD  bisoprolol (ZEBETA) 5 MG tablet Take 2.5 mg by mouth daily.    [provider]  fluticasone (FLONASE) 50 MCG/ACT nasal spray Place 2 sprays into both nostrils daily for 14  days. Patient taking differently: Place 2 sprays into both nostrils as needed.  01/10/19 06/24/20  Long, Wonda Olds, MD  furosemide (LASIX) 40 MG tablet Take 40 mg by mouth daily.    [provider]  hydrALAZINE (APRESOLINE) 25 MG tablet Take 1 tablet (25 mg total) by mouth 2 (two) times daily. Patient taking differently: Take 25 mg by mouth daily.  04/04/20 07/03/20  Satira Sark, MD  insulin aspart (NOVOLOG) 100 UNIT/ML FlexPen See admin instructions. Patient not taking: Reported on 06/24/2020 04/12/20   [provider]  insulin degludec (TRESIBA FLEXTOUCH) 100 UNIT/ML SOPN FlexTouch Pen Inject 0.5 mLs (50 Units total) into the skin daily at 10 pm. Patient taking differently: Inject 60 Units into the skin daily at 10 pm.  07/28/16   Kathie Dike, MD  ipratropium-albuterol (DUONEB) 0.5-2.5 (3) MG/3ML SOLN Inhale 3 mLs into the lungs every 4 (four) hours as needed.  05/22/20   [provider]  isosorbide mononitrate (IMDUR) 30 MG 24 hr tablet Take 1 tablet (30 mg  total) by mouth daily. 04/02/17   Orvan Falconer, MD  levocetirizine (XYZAL) 5 MG tablet Take 5 mg by mouth every evening.  08/27/17   [provider]  levothyroxine (SYNTHROID) 100 MCG tablet Take 100 mcg by mouth daily. (takes with 222mcg for a total of 379mcg) 01/08/20   [provider]  levothyroxine (SYNTHROID, LEVOTHROID) 200 MCG tablet Take 300 mcg by mouth daily before breakfast. (takes with 185mcg for a total of 348mcg)    [provider]  nitroGLYCERIN (NITROSTAT) 0.4 MG SL tablet Place 0.4 mg under the tongue every 5 (five) minutes as needed for chest pain.  12/27/17   [provider]  OXYGEN Inhale 2 L into the lungs daily as needed.     [provider]  senna-docusate (SENOKOT-S) 8.6-50 MG tablet Take 1 tablet by mouth daily as needed.  02/01/20   [provider]  simvastatin (ZOCOR) 5 MG tablet Take 5 mg by mouth daily. 09/14/16   [provider]   SYMBICORT 160-4.5 MCG/ACT inhaler Inhale 2 puffs into the lungs daily. 12/22/19   [provider]    Physical Exam: BP (!) 159/96 (BP Location: Left Arm)   Pulse 69   Temp (!) 97.3 F (36.3 C)   Resp 18   Ht 6\' 1"  (1.854 m)   Wt (!) 140.5 kg   SpO2 90%   BMI 40.87 kg/m   . General: 74 y.o. year-old male Obese in no acute distress.  Alert and oriented x3. Marland Kitchen HEENT: NCAT, EOMI . Neck: Supple, trachea medial . Cardiovascular: Irregular rate and rhythm with no rubs or gallops.  No thyromegaly or JVD noted.  2/4 pulses in all 4 extremities. Marland Kitchen Respiratory: Clear to auscultation with no wheezes or rales. Good inspiratory effort. . Abdomen: Soft nontender nondistended with normal bowel sounds x4 quadrants. . Muskuloskeletal: No cyanosis, +2 LE edema to the knees bilaterally . Neuro: CN II-XII intact, strength, sensation, reflexes . Skin: No ulcerative lesions noted or rashes . Psychiatry: Judgement and insight appear normal. Mood is appropriate for condition and setting          Labs on Admission:  Basic Metabolic Panel: Recent Labs  Lab 07/19/20 1720  NA 136  K 4.0  CL 104  CO2 23  GLUCOSE 94  BUN 34*  CREATININE 2.83*  CALCIUM 8.5*   Liver Function Tests: No results for input(s): AST, ALT, ALKPHOS, BILITOT, PROT, ALBUMIN in the last 168 hours. No results for input(s): LIPASE, AMYLASE in the last 168 hours. No results for input(s): AMMONIA in the last 168 hours. CBC: Recent Labs  Lab 07/19/20 1720  WBC 6.3  HGB 15.2  HCT 48.8  MCV 93.3  PLT 112*   Cardiac Enzymes: No results for input(s): CKTOTAL, CKMB, CKMBINDEX, TROPONINI in the last 168 hours.  BNP (last 3 results) Recent Labs    06/24/20 0029 07/19/20 1720  BNP 157.0* 182.0*    ProBNP (last 3 results) No results for input(s): PROBNP in the last 8760 hours.  CBG: No results for input(s): GLUCAP in the last 168 hours.  Radiological Exams on Admission: DG Chest 2 View  Result Date:  07/19/2020 CLINICAL DATA:  Increasing shortness of breath.  COVID 06/24/2020 EXAM: CHEST - 2 VIEW COMPARISON:  Chest x-ray 06/24/2020, CT chest 08/01/2019 FINDINGS: The heart size and mediastinal contours are within normal limits. Interval development of multifocal hazy airspace opacities. No pulmonary edema. Interval development of bilateral trace pleural effusions. No pneumothorax. No acute osseous abnormality.  IMPRESSION: Multifocal pneumonia with bilateral trace pleural effusions. Followup PA and lateral chest X-ray is recommended in 3-4 weeks following therapy to ensure resolution and exclude underlying malignancy. Electronically Signed   By: Iven Finn M.D.   On: 07/19/2020 18:07    EKG: I independently viewed the EKG done and my findings are as followed: Paroxysmal Atrial Fibrillation with rate control  Assessment/Plan Present on Admission: . Chronic kidney disease Stage IV . Morbid obesity ([place]) . Type 2 diabetes mellitus with stage 4 chronic kidney disease ([place]) . Hypertension . Elevated troponin I level . NSTEMI (non-ST elevated myocardial infarction) ([place]) . Hypothyroidism . Sleep apnea . CAD (coronary artery disease) . COVID-19  Principal Problem:   Acute exacerbation of CHF (congestive heart failure) (HCC) Active Problems:   Chronic kidney disease Stage IV   Morbid obesity (HCC)   Type 2 diabetes mellitus with stage 4 chronic kidney disease (HCC)   Hypertension   Elevated troponin I level   Sleep apnea   Hypothyroidism   NSTEMI (non-ST elevated myocardial infarction) (HCC)   CAD (coronary artery disease)   COVID-19   AF (paroxysmal atrial fibrillation) (HCC)   Acute exacerbation of CHF Patient endorsed shortness of breath on exertion and increased weight gain (he was unsure how much weight he has gained since last admission), he also complained of bilateral leg swelling. BNP of 182 (this was 157 on 11/22) Continue total input/output, daily weights and fluid  restriction Continue IV Lasix 40 twice daily as tolerated by BP Continue Cardiac diet  Echocardiogram done on 11/07/2019 showed LVEF of 45 to 50% with LV having mildly decreased function and LV demonstrating global hypokinesis.  Echocardiogram will be repeated in the morning   Paroxysmal atrial fibrillation CHA2DS2- VASc score   is = 4    Which is  equal to =4.8 % annual risk of stroke  EKG shows paroxysmal atrial fibrillation Patient was already on bisoprolol Therapeutic Lovenox x1 per Pharmacy consult with plan to transition to [place] in the morning if patient continues to be in A. Fib  Elevated troponin I level Troponin x 1= 19; this is possibly secondary to type II demand ischemia Continue to trend troponin  Multifocal pneumonia possibly due to sequela from COVID-19 virus infection Chest x-ray showed multifocal pneumonia with bilateral trace pleural effusions This is possibly a sequela due to prior COVID-19 virus infection No indication for starting antibiotics at this time Continue IV Lasix as indicated for CHF due to bilateral pleural effusion  CAD/NSTEMI Patient denies chest pain Continue ASA, imdur, zocor and bisoprolol  Essential hypertension Continue amlodipine and imdur  Hyperlipidemia Continue Zocor  Hypothyroidism Continue Synthroid  Type 2 diabetes mellitus Continue insulin sliding scale and hypoglycemic protocol  Sleep apnea (not on CPAP) Continue home supplemental oxygen  CKD stage IV BUN to creatinine 24/2.83 (creatinine within baseline range). Renally adjust medications, avoid nephrotoxic agents/dehydration/hypotension  Morbid obesity (BMI 40.65) Patient will be counseled on diet and lifestyle modification prior to discharge   ECHO done on 11/17/2019 IMPRESSIONS   1. Left ventricular ejection fraction, by estimation, is 45 to 50%. The  left ventricle has mildly decreased function. The left ventricle  demonstrates global hypokinesis. The left  ventricular internal cavity size  was severely dilated. There is moderate  left ventricular hypertrophy of the septal segment. Left ventricular  diastolic parameters are consistent with Grade II diastolic dysfunction  (pseudonormalization). Elevated left ventricular end-diastolic pressure.  2. Right ventricular systolic function is mildly reduced. The right  ventricular size is mildly enlarged. There is normal pulmonary artery  systolic pressure.  3. Left atrial size was mildly dilated.  4. Right atrial size was mildly dilated.  5. The mitral valve is grossly normal. No evidence of mitral valve  regurgitation.  6. The aortic valve was not well visualized. Aortic valve regurgitation  is trivial.  7. The inferior vena cava is normal in size with greater than 50%  respiratory variability, suggesting right atrial pressure of 3 mmHg.   DVT prophylaxis: Lovenox, SCD  Code Status: Full code  Family Communication: None at bedside  Disposition Plan:  Patient is from:                        home Anticipated DC to:                   home Anticipated DC date:               2-3 days Anticipated DC barriers:          Patient is unstable to be discharged at this time due to acute exacerbation of CHF and paroxysmal atrial fibrillation which requires inpatient management  Consults called: None  Admission status: Inpatient    Bernadette Hoit MD [facility]  07/19/2020, 11:08 PM

## 2020-07-19 NOTE — ED Provider Notes (Signed)
[facility] EMERGENCY DEPARTMENT Provider Note   CSN: [id number] Arrival date & time: 07/19/20  1640     History Chief Complaint  Patient presents with  . Shortness of Breath    [name] [name] is a 74 y.o. male with history of CAD, chronic kidney disease, hypertension, known left bundle branch block, history of non-STEMI, type 2 diabetes, history of non-Hodgkin's lymphoma who was recently admitted for Covid pneumonia having been discharged on 06/29/2020 presenting today for evaluation of worsening shortness of breath and peripheral edema for the past week.  He does use home oxygen as needed, since being discharged from his Covid admission he has been using oxygen at 3 L most of the time.  He states that on room air his oxygen is dropping to 78%.  He reports having a wet sounding cough but has difficulty coughing up any sputum.  He also endorses wheezing which has responded to his albuterol MDI, last dose was taken just a few minutes before exam.  He was seen by his PCP today and was told to come here to "have the fluid drawn off".  Patient denies fevers or chills, also denies orthopnea.  He does state that the swelling in his legs is worse than he has ever seen it.  He takes Lasix 40 mg daily and denies any missed doses.  He also denies chest pain, palpitations, dizziness, but does endorse generalized weakness.  HPI     Past Medical History:  Diagnosis Date  . CAD (coronary artery disease)    DES to LAD June 2018  . CKD (chronic kidney disease) stage 3, GFR 30-59 ml/min (HCC)   . COVID   . Essential hypertension   . History of pneumonia   . Hyperlipidemia   . Hypothyroidism   . LBBB (left bundle branch block)   . Morbid obesity ([place])   . Non Hodgkin's lymphoma ([place])    Status post XRT and chemotherapy  . NSTEMI (non-ST elevated myocardial infarction) [facility])    June 2018  . Peripheral neuropathy   . Sleep apnea   . Type 2 diabetes mellitus [facility])     Patient Active Problem List    Diagnosis Date Noted  . COVID-19   . Acute on chronic respiratory failure with hypoxia ([place]) 06/25/2020  . Aspiration pneumonia ([place]) 06/24/2020  . Pneumonia due to COVID-19 virus 06/24/2020  . Confusion   . Small bowel obstruction ([place]) 01/29/2020  . SBO (small bowel obstruction) ([place]) 01/28/2020  . Primary osteoarthritis of left knee 05/24/2018  . Primary localized osteoarthritis of left knee 05/19/2018  . Acute on chronic systolic and diastolic heart failure, NYHA class 1 ([place]) 04/01/2017  . CAD (coronary artery disease) 04/01/2017  . SOB (shortness of breath) 03/21/2017  . HTN (hypertension) 03/21/2017  . NSTEMI (non-ST elevated myocardial infarction) ([place]) 01/19/2017  . NSTEMI, initial episode of care ([place]) 01/19/2017  . Sepsis ([place]) 07/24/2016  . Elevated troponin I level 07/24/2016  . Fever 07/24/2016  . Lactic acidosis 07/24/2016  . Adjustment insomnia 05/18/2016  . Erectile dysfunction 12/19/2015  . Sleep apnea 09/30/2015  . Osteoarthritis 09/30/2015  . Hypothyroidism 09/30/2015  . Hypogonadism in male 09/30/2015  . Diabetic neuropathy ([place]) 09/30/2015  . Chronic pain of left knee 09/30/2015  . Benign essential hypertension 09/30/2015  . Acute on chronic kidney failure ([place]) 05/14/2015  . Diarrhea 05/14/2015  . Dehydration 05/14/2015  . Hypokalemia 05/14/2015  . 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 Pelvic mass in male   .  Varices, esophageal ([place])   . Colonic mass   . Abnormal CT scan, pelvis   . Bladder mass   . Elevated liver enzymes   . Elevated LFTs   . Abdominal pain 07/23/2014  . Chronic kidney disease Stage IV 07/23/2014  . Morbid obesity ([place]) 07/23/2014  . Type 2 diabetes mellitus with stage 4 chronic kidney disease ([place]) 07/23/2014  . Hypertension 07/23/2014  . S/P total knee replacement, 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    Past Surgical History:  Procedure Laterality Date  . CHOLECYSTECTOMY  1992  . COLONOSCOPY N/A 09/03/2014   SLF:six colon polyps  removed/small internal hemorrhoids  . CORONARY STENT INTERVENTION N/A 01/21/2017   Procedure: Coronary Stent Intervention;  Surgeon: Nelva Bush, MD;  Location: [facility];  Service: Cardiovascular;  Laterality: N/A;  . ESOPHAGOGASTRODUODENOSCOPY N/A 09/03/2014   SLF: mild gastritis/few gastric polyps  . LEFT HEART CATH AND CORONARY ANGIOGRAPHY N/A 01/20/2017   Procedure: Left Heart Cath and Coronary Angiography;  Surgeon: Jettie Booze, MD;  Location: [facility];  Service: Cardiovascular;  Laterality: N/A;  . TOTAL KNEE ARTHROPLASTY  11/10/2011   Procedure: TOTAL KNEE ARTHROPLASTY;  Surgeon: Mauri Pole, MD;  Location: [facility];  Service: Orthopedics;  Laterality: Right;  . TOTAL KNEE ARTHROPLASTY Left 05/24/2018   Procedure: LEFT TOTAL KNEE ARTHROPLASTY;  Surgeon: Melrose Nakayama, MD;  Location: [place];  Service: Orthopedics;  Laterality: Left;       Family History  Problem Relation Age of Onset  . Cancer Mother        breast and lung  . Cancer Father        bladder  . Cancer Maternal Uncle        prostate  . Cancer Paternal Uncle        esophagus  . Colon cancer Neg Hx     Social History   Tobacco Use  . Smoking status: Current Some Day Smoker    Packs/day: 0.25    Years: 30.00    Pack years: 7.50    Types: Cigarettes    Last attempt to quit: 08/04/2007    Years since quitting: 12.9  . Smokeless tobacco: Never Used  Vaping Use  . Vaping Use: Never used  Substance Use Topics  . Alcohol use: No    Alcohol/week: 0.0 standard drinks  . Drug use: No    Home Medications Prior to Admission medications   Medication Sig Start Date End Date Taking? Authorizing Provider  acetaminophen (TYLENOL) 325 MG tablet Take 2 tablets (650 mg total) by mouth every 6 (six) hours as needed for mild pain (or Fever >/= 101). 02/01/20   Roxan Hockey, MD  albuterol (VENTOLIN HFA) 108 (90 Base) MCG/ACT inhaler Inhale 1-2 puffs into the lungs every 6 (six) hours as needed  for wheezing or shortness of breath. 01/10/19   Long, Wonda Olds, MD  amLODipine (NORVASC) 10 MG tablet Take 10 mg by mouth daily.    [provider]  aspirin EC 81 MG tablet Take 1 tablet (81 mg total) by mouth daily with breakfast. Swallow whole. 02/01/20   Roxan Hockey, MD  bisoprolol (ZEBETA) 5 MG tablet Take 2.5 mg by mouth daily.    [provider]  fluticasone (FLONASE) 50 MCG/ACT nasal spray Place 2 sprays into both nostrils daily for 14 days. Patient taking differently: Place 2 sprays into both nostrils as needed.  01/10/19 06/24/20  Long, Wonda Olds, MD  furosemide (LASIX) 40 MG tablet Take 40 mg by  mouth daily.    [provider]  hydrALAZINE (APRESOLINE) 25 MG tablet Take 1 tablet (25 mg total) by mouth 2 (two) times daily. Patient taking differently: Take 25 mg by mouth daily.  04/04/20 07/03/20  Satira Sark, MD  insulin aspart (NOVOLOG) 100 UNIT/ML FlexPen See admin instructions. Patient not taking: Reported on 06/24/2020 04/12/20   [provider]  insulin degludec (TRESIBA FLEXTOUCH) 100 UNIT/ML SOPN FlexTouch Pen Inject 0.5 mLs (50 Units total) into the skin daily at 10 pm. Patient taking differently: Inject 60 Units into the skin daily at 10 pm.  07/28/16   Kathie Dike, MD  ipratropium-albuterol (DUONEB) 0.5-2.5 (3) MG/3ML SOLN Inhale 3 mLs into the lungs every 4 (four) hours as needed.  05/22/20   [provider]  isosorbide mononitrate (IMDUR) 30 MG 24 hr tablet Take 1 tablet (30 mg total) by mouth daily. 04/02/17   Orvan Falconer, MD  levocetirizine (XYZAL) 5 MG tablet Take 5 mg by mouth every evening.  08/27/17   [provider]  levothyroxine (SYNTHROID) 100 MCG tablet Take 100 mcg by mouth daily. (takes with 278mcg for a total of 365mcg) 01/08/20   [provider]  levothyroxine (SYNTHROID, LEVOTHROID) 200 MCG tablet Take 300 mcg by mouth daily before breakfast. (takes with 169mcg for a total of 38mcg)    [provider]  nitroGLYCERIN (NITROSTAT) 0.4 MG SL tablet Place 0.4 mg under the tongue every 5 (five) minutes as needed for chest pain.  12/27/17   [provider]  OXYGEN Inhale 2 L into the lungs daily as needed.     [provider]  senna-docusate (SENOKOT-S) 8.6-50 MG tablet Take 1 tablet by mouth daily as needed.  02/01/20   [provider]  simvastatin (ZOCOR) 5 MG tablet Take 5 mg by mouth daily. 09/14/16   [provider]  SYMBICORT 160-4.5 MCG/ACT inhaler Inhale 2 puffs into the lungs daily. 12/22/19   [provider]    Allergies    Patient has no known allergies.  Review of Systems   Review of Systems  Constitutional: Positive for fatigue. Negative for chills and fever.  HENT: Negative.   Eyes: Negative.   Respiratory: Positive for cough, shortness of breath and wheezing. Negative for chest tightness.   Cardiovascular: Positive for leg swelling. Negative for chest pain.  Gastrointestinal: Negative for abdominal pain and nausea.  Genitourinary: Negative.  Negative for difficulty urinating.  Musculoskeletal: Negative for arthralgias, joint swelling and neck pain.  Skin: Negative.  Negative for rash and wound.  Neurological: Negative for dizziness, weakness, light-headedness, numbness and headaches.  Psychiatric/Behavioral: Negative.     Physical Exam Updated Vital Signs BP (!) 142/85   Pulse 71   Temp 97.7 F (36.5 C) (Oral)   Resp (!) 22   Ht 6\' 1"  (1.854 m)   Wt 134.5 kg   SpO2 95%   BMI 39.12 kg/m   Physical Exam Vitals and nursing note reviewed.  Constitutional:      Appearance: He is well-developed and well-nourished.  HENT:     Head: Normocephalic and atraumatic.  Eyes:     Conjunctiva/sclera: Conjunctivae normal.  Cardiovascular:     Rate and Rhythm: Normal rate. Rhythm irregular.     Pulses: Intact distal pulses.     Heart sounds: Normal heart sounds.  Pulmonary:     Effort: Pulmonary effort is normal.      Breath sounds: Examination of the right-lower field reveals rales. Examination of the left-lower  field reveals rales. Decreased breath sounds and rales present. No wheezing or rhonchi.  Abdominal:     General: Bowel sounds are normal.     Palpations: Abdomen is soft.     Tenderness: There is no abdominal tenderness.  Musculoskeletal:        General: Normal range of motion.     Cervical back: Normal range of motion.     Right lower leg: Edema present.     Left lower leg: Edema present.     Comments: Pitting edema bilateral to upper tibias.  Skin:    General: Skin is warm and dry.  Neurological:     Mental Status: He is alert.  Psychiatric:        Mood and Affect: Mood and affect normal.     ED Results / Procedures / Treatments   Labs (all labs ordered are listed, but only abnormal results are displayed) Labs Reviewed  BASIC METABOLIC PANEL - Abnormal; Notable for the following components:      Result Value   BUN 34 (*)    Creatinine, Ser 2.83 (*)    Calcium 8.5 (*)    GFR, Estimated 23 (*)    All other components within normal limits  CBC - Abnormal; Notable for the following components:   RDW 18.0 (*)    Platelets 112 (*)    All other components within normal limits  BRAIN NATRIURETIC PEPTIDE - Abnormal; Notable for the following components:   B Natriuretic Peptide 182.0 (*)    All other components within normal limits  TROPONIN I (HIGH SENSITIVITY) - Abnormal; Notable for the following components:   Troponin I (High Sensitivity) 19 (*)    All other components within normal limits  URINALYSIS, ROUTINE W REFLEX MICROSCOPIC    EKG EKG Interpretation  Date/Time:  Friday July 19 2020 18:17:37 EST Ventricular Rate:  63 PR Interval:    QRS Duration: 157 QT Interval:  472 QTC Calculation: 484 R Axis:   -61 Text Interpretation: Atrial fibrillation Left bundle branch block Baseline wander in lead(s) V6 Since last tracing now in atrial fibrillation Otherwise no  significant change Confirmed by Daleen Bo [phone]) on 07/19/2020 9:32:51 PM   Radiology DG Chest 2 View  Result Date: 07/19/2020 CLINICAL DATA:  Increasing shortness of breath.  COVID 06/24/2020 EXAM: CHEST - 2 VIEW COMPARISON:  Chest x-ray 06/24/2020, CT chest 08/01/2019 FINDINGS: The heart size and mediastinal contours are within normal limits. Interval development of multifocal hazy airspace opacities. No pulmonary edema. Interval development of bilateral trace pleural effusions. No pneumothorax. No acute osseous abnormality. IMPRESSION: Multifocal pneumonia with bilateral trace pleural effusions. Followup PA and lateral chest X-ray is recommended in 3-4 weeks following therapy to ensure resolution and exclude underlying malignancy. Electronically Signed   By: Iven Finn M.D.   On: 07/19/2020 18:07    Procedures Procedures (including critical care time)  Medications Ordered in ED Medications  albuterol (VENTOLIN HFA) 108 (90 Base) MCG/ACT inhaler 2 puff (2 puffs Inhalation Given 07/19/20 1813)  furosemide (LASIX) injection 80 mg (80 mg Intravenous Given 07/19/20 1928)    ED Course  I have reviewed the triage vital signs and the nursing notes.  Pertinent labs & imaging results that were available during my care of the patient were reviewed by me and considered in my medical decision making (see chart for details).    MDM Rules/Calculators/A&P  Patient with recent COVID-19 pneumonia requiring hospitalization, now presenting with increasing shortness of breath and peripheral edema.  Chest x-ray was reviewed showing multifocal pneumonia, suspect this is ongoing sequelae of his recent Covid 19 pneumonia admission.  Sx suggestive of CHF exacerbation with increased peripheral edema, rales on exam.  Also has new onset afib, denies chest pain, palpitations, dizziness.    Pt will require admission.  He was given lasix and has started to diurese here.  Discussed  with Dr. Josephine Cables who accepts pt for admission.   [name] [name] was evaluated in Emergency Department on 07/19/2020 for the symptoms described in the history of present illness. He was evaluated in the context of the global COVID-19 pandemic, which necessitated consideration that the patient might be at risk for infection with the SARS-CoV-2 virus that causes COVID-19. Institutional protocols and algorithms that pertain to the evaluation of patients at risk for COVID-19 are in a state of rapid change based on information released by regulatory bodies including the CDC and federal and state organizations. These policies and algorithms were followed during the patient's care in the ED.   Final Clinical Impression(s) / ED Diagnoses Final diagnoses:  Atrial fibrillation, new onset ([place])  Acute on chronic congestive heart failure, unspecified heart failure type ([place])  Pneumonia due to COVID-19 virus    Rx / DC Orders ED Discharge Orders    None       Landis Martins 07/19/20 2204    Daleen Bo, MD 07/20/20 1459

## 2020-07-19 NOTE — Progress Notes (Signed)
ANTICOAGULATION CONSULT NOTE - Initial Consult  Pharmacy Consult for Lovenox x 1  Indication: atrial fibrillation  No Known Allergies  Patient Measurements: Height: 6\' 1"  (185.4 cm) Weight: (!) 139.8 kg (308 lb 1.6 oz) IBW/kg (Calculated) : 79.9  Vital Signs: Temp: 97.3 F (36.3 C) (12/17 2246) Temp Source: Oral (12/17 1659) BP: 159/96 (12/17 2246) Pulse Rate: 69 (12/17 2246)  Labs: Recent Labs    07/19/20 1720  HGB 15.2  HCT 48.8  PLT 112*  CREATININE 2.83*  TROPONINIHS 19*    Estimated Creatinine Clearance: 34.2 mL/min (A) (by C-G formula based on SCr of 2.83 mg/dL (H)).   Medical History: Past Medical History:  Diagnosis Date  . CAD (coronary artery disease)    DES to LAD June 2018  . CKD (chronic kidney disease) stage 3, GFR 30-59 ml/min (HCC)   . COVID   . Essential hypertension   . History of pneumonia   . Hyperlipidemia   . Hypothyroidism   . LBBB (left bundle branch block)   . Morbid obesity ([place])   . Non Hodgkin's lymphoma ([place])    Status post XRT and chemotherapy  . NSTEMI (non-ST elevated myocardial infarction) [facility])    June 2018  . Peripheral neuropathy   . Sleep apnea   . Type 2 diabetes mellitus (HCC)    Assessment: 74 y/o M with shortness of breath, recent COVID infection, also found to be in atrial fibrillation, consulted to give one dose of Lovenox tonight, with MD planning for DOAC tomorrow if patient remains in afib. Hgb ok. Plts 112. Noted renal dysfunction.   Goal of Therapy:  Monitor platelets by anticoagulation protocol: Yes   Plan:  Lovenox 140 mg subcutaneous x 1  Monitor for bleeding F/U plan to start DOAC tomorrow if patient remains in [place], PharmD, [place] Pharmacist Phone: [phone]

## 2020-07-19 NOTE — ED Provider Notes (Signed)
  Face-to-face evaluation   History: He is here for evaluation of dyspnea on exertion.  He has also noticed swelling of his legs which started after he left the hospital.  He is spending all time sitting watching TV.  He is eating well.  He saw his PCP today who suggested he come to the ED for diuresis.  He had been taking Lasix 40 mg daily.  Physical exam: Obese alert man who is calm comfortable.  No respiratory distress while on oxygen with normal oxygen saturation of 97%.  He has moderate peripheral edema, which is bilateral.  Medical screening examination/treatment/procedure(s) were conducted as a shared visit with non-physician practitioner(s) and myself.  I personally evaluated the patient during the encounter    Daleen Bo, MD 07/20/20 1459

## 2020-07-19 NOTE — ED Notes (Signed)
Checked on patient. Patient on side saturation 97 on 4 liters , no wheezes or crackles noted. Patients breathing not labored.

## 2020-07-19 NOTE — ED Triage Notes (Signed)
Pt in hospital; 11/22 after being dx with covid pneumonia. Pt reports that he has had to start wearing oxygen all the time over the last few days. Pts sats on room air 78 % . Pt has been wearing O2 at 3 L

## 2020-07-20 ENCOUNTER — Inpatient Hospital Stay (HOSPITAL_COMMUNITY): Payer: Medicare Other

## 2020-07-20 DIAGNOSIS — I130 Hypertensive heart and chronic kidney disease with heart failure and stage 1 through stage 4 chronic kidney disease, or unspecified chronic kidney disease: Secondary | ICD-10-CM | POA: Diagnosis not present

## 2020-07-20 DIAGNOSIS — I5021 Acute systolic (congestive) heart failure: Secondary | ICD-10-CM | POA: Diagnosis not present

## 2020-07-20 DIAGNOSIS — I480 Paroxysmal atrial fibrillation: Secondary | ICD-10-CM | POA: Diagnosis not present

## 2020-07-20 DIAGNOSIS — N184 Chronic kidney disease, stage 4 (severe): Secondary | ICD-10-CM | POA: Diagnosis not present

## 2020-07-20 DIAGNOSIS — I509 Heart failure, unspecified: Secondary | ICD-10-CM | POA: Diagnosis not present

## 2020-07-20 DIAGNOSIS — I5023 Acute on chronic systolic (congestive) heart failure: Secondary | ICD-10-CM | POA: Diagnosis not present

## 2020-07-20 LAB — ECHOCARDIOGRAM COMPLETE
AR max vel: 1.56 cm2
AV Area VTI: 1.55 cm2
AV Area mean vel: 1.3 cm2
AV Mean grad: 6.7 mmHg
AV Peak grad: 13.2 mmHg
Ao pk vel: 1.81 m/s
Area-P 1/2: 4.08 cm2
Height: 73 in
P 1/2 time: 561 msec
Weight: 4929.6 oz

## 2020-07-20 LAB — CBC
HCT: 49.8 % (ref 39.0–52.0)
Hemoglobin: 15.8 g/dL (ref 13.0–17.0)
MCH: 29.6 pg (ref 26.0–34.0)
MCHC: 31.7 g/dL (ref 30.0–36.0)
MCV: 93.3 fL (ref 80.0–100.0)
Platelets: 111 10*3/uL — ABNORMAL LOW (ref 150–400)
RBC: 5.34 MIL/uL (ref 4.22–5.81)
RDW: 17.2 % — ABNORMAL HIGH (ref 11.5–15.5)
WBC: 5.7 10*3/uL (ref 4.0–10.5)
nRBC: 0 % (ref 0.0–0.2)

## 2020-07-20 LAB — COMPREHENSIVE METABOLIC PANEL
ALT: 12 U/L (ref 0–44)
AST: 15 U/L (ref 15–41)
Albumin: 3.9 g/dL (ref 3.5–5.0)
Alkaline Phosphatase: 80 U/L (ref 38–126)
Anion gap: 11 (ref 5–15)
BUN: 34 mg/dL — ABNORMAL HIGH (ref 8–23)
CO2: 25 mmol/L (ref 22–32)
Calcium: 8.5 mg/dL — ABNORMAL LOW (ref 8.9–10.3)
Chloride: 102 mmol/L (ref 98–111)
Creatinine, Ser: 2.5 mg/dL — ABNORMAL HIGH (ref 0.61–1.24)
GFR, Estimated: 26 mL/min — ABNORMAL LOW (ref >60)
Glucose, Bld: 84 mg/dL (ref 70–99)
Potassium: 3.3 mmol/L — ABNORMAL LOW (ref 3.5–5.1)
Sodium: 138 mmol/L (ref 135–145)
Total Bilirubin: 1.4 mg/dL — ABNORMAL HIGH (ref 0.3–1.2)
Total Protein: 7.2 g/dL (ref 6.5–8.1)

## 2020-07-20 LAB — TROPONIN I (HIGH SENSITIVITY)
Troponin I (High Sensitivity): 21 ng/L — ABNORMAL HIGH (ref <18)
Troponin I (High Sensitivity): 20 ng/L — ABNORMAL HIGH (ref <18)

## 2020-07-20 LAB — PHOSPHORUS: Phosphorus: 3.4 mg/dL (ref 2.5–4.6)

## 2020-07-20 LAB — APTT: aPTT: 35 seconds (ref 24–36)

## 2020-07-20 LAB — MAGNESIUM: Magnesium: 2.3 mg/dL (ref 1.7–2.4)

## 2020-07-20 LAB — PROTIME-INR
INR: 1.1 (ref 0.8–1.2)
Prothrombin Time: 14 seconds (ref 11.4–15.2)

## 2020-07-20 LAB — GLUCOSE, CAPILLARY
Glucose-Capillary: 135 mg/dL — ABNORMAL HIGH (ref 70–99)
Glucose-Capillary: 90 mg/dL (ref 70–99)

## 2020-07-20 MED ORDER — ISOSORBIDE MONONITRATE ER 60 MG PO TB24
30.0000 mg | ORAL_TABLET | Freq: Every day | ORAL | Status: DC
  Administered 2020-07-20: 09:00:00 30 mg via ORAL
  Filled 2020-07-20: qty 1

## 2020-07-20 MED ORDER — SIMVASTATIN 10 MG PO TABS
5.0000 mg | ORAL_TABLET | Freq: Every day | ORAL | Status: DC
Start: 2020-07-20 — End: 2020-07-20
  Administered 2020-07-20: 09:00:00 5 mg via ORAL
  Filled 2020-07-20: qty 1

## 2020-07-20 MED ORDER — FUROSEMIDE 10 MG/ML IJ SOLN
40.0000 mg | Freq: Two times a day (BID) | INTRAMUSCULAR | Status: DC
  Administered 2020-07-20: 09:00:00 40 mg via INTRAVENOUS
  Filled 2020-07-20: qty 4

## 2020-07-20 MED ORDER — AMLODIPINE BESYLATE 5 MG PO TABS
10.0000 mg | ORAL_TABLET | Freq: Every day | ORAL | Status: DC
  Administered 2020-07-20: 09:00:00 10 mg via ORAL
  Filled 2020-07-20: qty 2

## 2020-07-20 MED ORDER — ASPIRIN EC 81 MG PO TBEC
81.0000 mg | DELAYED_RELEASE_TABLET | Freq: Every day | ORAL | Status: DC
  Administered 2020-07-20: 09:00:00 81 mg via ORAL
  Filled 2020-07-20: qty 1

## 2020-07-20 MED ORDER — INSULIN ASPART 100 UNIT/ML ~~LOC~~ SOLN: 0.0000 [IU] | Freq: Every day | SUBCUTANEOUS | Status: DC

## 2020-07-20 MED ORDER — LEVOTHYROXINE SODIUM 100 MCG PO TABS
300.0000 ug | ORAL_TABLET | Freq: Every day | ORAL | Status: DC
  Administered 2020-07-20: 06:00:00 300 ug via ORAL
  Filled 2020-07-20: qty 3

## 2020-07-20 MED ORDER — BISOPROLOL FUMARATE 5 MG PO TABS
2.5000 mg | ORAL_TABLET | Freq: Every day | ORAL | Status: DC
Start: 2020-07-20 — End: 2020-07-20
  Administered 2020-07-20: 09:00:00 2.5 mg via ORAL
  Filled 2020-07-20: qty 1

## 2020-07-20 MED ORDER — POTASSIUM CHLORIDE CRYS ER 20 MEQ PO TBCR
40.0000 meq | EXTENDED_RELEASE_TABLET | Freq: Once | ORAL | Status: DC
  Filled 2020-07-20: qty 2

## 2020-07-20 MED ORDER — INSULIN ASPART 100 UNIT/ML ~~LOC~~ SOLN: 0.0000 [IU] | Freq: Three times a day (TID) | SUBCUTANEOUS | Status: DC

## 2020-07-20 NOTE — Discharge Summary (Signed)
[name] [name], is a 74 y.o. male  DOB 1946-04-15  MRN [id number].  Admission date:  07/19/2020  Admitting Physician  Bernadette Hoit, DO  Discharge Date:  07/20/2020   Primary MD  [name]  Recommendations for primary care physician for things to follow:    --Patient insisted on leaving [place] despite persuasion to the contrary  Admission Diagnosis  Acute exacerbation of CHF (congestive heart failure) ([place]) [I50.9] Atrial fibrillation, new onset ([place]) [I48.91] Acute on chronic congestive heart failure, unspecified heart failure type ([place]) [I50.9] Pneumonia due to COVID-19 virus [U07.1, J12.82]   Discharge Diagnosis  Acute exacerbation of CHF (congestive heart failure) ([place]) [I50.9] Atrial fibrillation, new onset ([place]) [I48.91] Acute on chronic congestive heart failure, unspecified heart failure type ([place]) [I50.9] Pneumonia due to COVID-19 virus [U07.1, J12.82]    Principal Problem:   Acute exacerbation of CHF (congestive heart failure) ([place]) Active Problems:   Chronic kidney disease Stage IV   Morbid obesity ([place])   Type 2 diabetes mellitus with stage 4 chronic kidney disease (HCC)   Hypertension   Elevated troponin I level   Sleep apnea   Hypothyroidism   NSTEMI (non-ST elevated myocardial infarction) (HCC)   CAD (coronary artery disease)   COVID-19   AF (paroxysmal atrial fibrillation) (HCC)      Past Medical History:  Diagnosis Date  . CAD (coronary artery disease)    DES to LAD June 2018  . CKD (chronic kidney disease) stage 3, GFR 30-59 ml/min (HCC)   . COVID   . Essential hypertension   . History of pneumonia   . Hyperlipidemia   . Hypothyroidism   . LBBB (left bundle branch block)   . Morbid obesity ([place])   . Non Hodgkin's lymphoma ([place])    Status post XRT and chemotherapy  . NSTEMI (non-ST elevated myocardial infarction)  [facility])    June 2018  . Peripheral neuropathy   . Sleep apnea   . Type 2 diabetes mellitus ([place])     Past Surgical History:  Procedure Laterality Date  . CHOLECYSTECTOMY  1992  . COLONOSCOPY N/A 09/03/2014   SLF:six colon polyps removed/small internal hemorrhoids  . CORONARY STENT INTERVENTION N/A 01/21/2017   Procedure: Coronary Stent Intervention;  Surgeon: Nelva Bush, MD;  Location: [facility];  Service: Cardiovascular;  Laterality: N/A;  . ESOPHAGOGASTRODUODENOSCOPY N/A 09/03/2014   SLF: mild gastritis/few gastric polyps  . LEFT HEART CATH AND CORONARY ANGIOGRAPHY N/A 01/20/2017   Procedure: Left Heart Cath and Coronary Angiography;  Surgeon: Jettie Booze, MD;  Location: [facility];  Service: Cardiovascular;  Laterality: N/A;  . TOTAL KNEE ARTHROPLASTY  11/10/2011   Procedure: TOTAL KNEE ARTHROPLASTY;  Surgeon: Mauri Pole, MD;  Location[facility];  Service: Orthopedics;  Laterality: Right;  . TOTAL KNEE ARTHROPLASTY Left 05/24/2018   Procedure: LEFT TOTAL KNEE ARTHROPLASTY;  Surgeon: Melrose Nakayama, MD;  Location: [facility];  Service: Orthopedics;  Laterality: Left;     HPI  from  the history and physical done on the day of admission:    Chief Complaint: Shortness of breath   HPI: [name] [name] is a 74 y.o. male with medical history significant for CAD s/p stent placement (2018), history of NSTEMI, LBBB, stage IV CKD, hypertension, hyperlipidemia, hypothyroidism, class III obesity, history of non-Hodgkin's lymphoma with chemo and radiotherapy, sleep apnea, peripheral neuropathy, type 2 diabetes mellitus who presents to the emergency department due to 2 to 3-day onset of increased shortness of breath which worsens on exertion and increased leg swelling.   Patient was admitted and discharged from 11/22-11/27 (during which he left AMA) due to acute respiratory failure due to COVID-19 and aspiration pneumonitis. Patient states that he is on home oxygen via [place] at 3  LPM as needed, but states that he has been using the oxygen more frequently within the last 2 to 3 days.  He complains of chest congestion with difficulty in being able to bring up any sputum when coughing.  Patient has also been using home MDI with improvement in his wheezing.  He states that he called his PCP this afternoon and was asked to go to the ED for further evaluation and management.  Patient states that he has been compliant with his home Lasix, however, he has not been compliant with food intake which include snacks, canned food, pizza etc. Patient lives at home with his daughter and her family  ED Course:  In the emergency department, BP was 156/88 and O2 sat was 90-95% on supplemental oxygen at 4 LPM.  Work-up in the ED showed platelets 112, BNP of 182 (this was 157 on 11/22), troponin x1-19, BUN to creatinine 24/2.83 (creatinine within baseline range).  Chest x-ray showed multifocal pneumonia with bilateral trace pleural effusions.  IV Lasix 80 Mg x1 was given.  Hospitalist was asked to admit patient for further evaluation and management.     Hospital Course:      NB!!! --He left AMA today to attend family gathering -Please also Note the patient also left AGAINST MEDICAL ADVICE on 06/29/2020  A/p 1) acute on chronic diastolic congestive heart failure exacerbation--- Echo with EF of 50 to 55% with grade 2 diastolic dysfunction---- --patient stated that he felt better after diuresis and voiding much, - --Patient insisted on leaving [place] despite persuasion to the contrary- -He left AMA today to attend family gathering   2) recent COVID-19 infection/Covid pneumonia with hypoxia----diagnosed 06/24/2020 -Patient already has home O2   3)Social/Ethics/Disposition:- --Patient insisted on leaving [place] despite persuasion to the contrary----patient states that they have a family gathering this weekend, he does not want to miss time with family -He  left AMA today to attend family gathering -Please also Note the patient also left AGAINST MEDICAL ADVICE on 06/29/2020  4)PAFIb-- CHA2DS2- VASc score   is = 4    Which is  equal to =4.8 % annual risk of stroke  -Continue bisoprolol -Patient declines anticoagulation, he wants to talk to his PCP and cardiologist prior making decisions about possible full anticoagulation  5)CAD--troponin noted, patient without chest pains or ACS type symptoms -Continue ASA, imdur, zocor and bisoprolol -Patient left AMA  6) morbid obesity/OSA----patient does not want further evaluation for possible CPAP  7)CKD IV--- avoidance of NSAIDs discussed  DM2, HTN, HLD and hypothyroidism----okay to resume preadmission medications  Discharge Condition:  --He left AMA today to attend family gathering -Please also Note the patient also left [place] on 06/29/2020  Follow  UP--PCP and cardiologist   Consults obtained - Na  Diet and Activity recommendation:  As advised  Discharge Instructions      Discharge Medications     Major procedures and Radiology Reports - PLEASE review detailed and final reports for all details, in brief -    DG Chest 2 View  Result Date: 07/19/2020 CLINICAL DATA:  Increasing shortness of breath.  COVID 06/24/2020 EXAM: CHEST - 2 VIEW COMPARISON:  Chest x-ray 06/24/2020, CT chest 08/01/2019 FINDINGS: The heart size and mediastinal contours are within normal limits. Interval development of multifocal hazy airspace opacities. No pulmonary edema. Interval development of bilateral trace pleural effusions. No pneumothorax. No acute osseous abnormality. IMPRESSION: Multifocal pneumonia with bilateral trace pleural effusions. Followup PA and lateral chest X-ray is recommended in 3-4 weeks following therapy to ensure resolution and exclude underlying malignancy. Electronically Signed   By: Iven Finn M.D.   On: 07/19/2020 18:07   DG Chest Port 1 View  Result Date:  06/24/2020 CLINICAL DATA:  Weakness, lethargy, and confusion after choking episode a couple of days ago. EXAM: PORTABLE CHEST 1 VIEW COMPARISON:  01/10/2019 FINDINGS: Mild cardiac enlargement. Mild pulmonary vascular congestion. No airspace disease or consolidation. No pleural effusions. No pneumothorax. Mediastinal contours appear intact. IMPRESSION: Mild cardiac enlargement and pulmonary vascular congestion. No edema or consolidation. Electronically Signed   By: Lucienne Capers M.D.   On: 06/24/2020 01:37    Micro Results   No results found for this or any previous visit (from the past 240 hour(s)).  Today   Subjective    [name] [name] today has no new complaints -Patient states he feels better after IV Lasix and voiding quite a bit ---He left AMA today to attend family gathering -Please also Note the patient also left [place] on 06/29/2020          Patient has been seen and examined prior to discharge   Objective   Blood pressure 134/76, pulse (!) 58, temperature 97.8 F (36.6 C), resp. rate 18, height 6\' 1"  (1.854 m), weight (!) 139.8 kg, SpO2 94 %.   Intake/Output Summary (Last 24 hours) at 07/20/2020 1032 Last data filed at 07/20/2020 0900 Gross per 24 hour  Intake --  Output 3125 ml  Net -3125 ml    Exam Gen:- Awake Alert, no acute distress  HEENT:- [place].AT, No sclera icterus Nose- [place] 3L/min Neck-Supple Neck,No JVD,.  Lungs-diminished breath sounds, faint bibasilar rales  CV- S1, S2 normal, regular Abd-  +ve B.Sounds, Abd Soft, No tenderness,    Extremity/Skin:-2+ pitting edema with chronic venous stasis type changes,   good pulses Psych-affect is appropriate, oriented x3 Neuro-no new focal deficits, no tremors    Data Review   CBC w Diff:  Lab Results  Component Value Date   WBC 5.7 07/20/2020   HGB 15.8 07/20/2020   HCT 49.8 07/20/2020   PLT 111 (L) 07/20/2020   LYMPHOPCT 16 06/29/2020   MONOPCT 5 06/29/2020   EOSPCT 0 06/29/2020    BASOPCT 0 06/29/2020    CMP:  Lab Results  Component Value Date   NA 138 07/20/2020   K 3.3 (L) 07/20/2020   CL 102 07/20/2020   CO2 25 07/20/2020   BUN 34 (H) 07/20/2020   CREATININE 2.50 (H) 07/20/2020   PROT 7.2 07/20/2020   ALBUMIN 3.9 07/20/2020   BILITOT 1.4 (H) 07/20/2020   ALKPHOS 80 07/20/2020   AST 15 07/20/2020   ALT 12 07/20/2020  .   Total  Discharge time is about 33 minutes  Roxan Hockey M.D on 07/20/2020 at 10:32 AM  Go to [email or web address] -  for contact info  [facility] - Office  [phone]

## 2020-07-20 NOTE — Progress Notes (Signed)
*  PRELIMINARY RESULTS* Echocardiogram 2D Echocardiogram has been performed.  Leavy Cella 07/20/2020, 10:01 AM

## 2020-07-24 NOTE — Progress Notes (Deleted)
Cardiology Office Note  Date: 07/24/2020   ID: Chapman, Matteucci 1946/07/10, MRN [id number]  PCP:  [name] [place] [facility]  Cardiologist:  Rozann Lesches, MD Electrophysiologist:  None   Chief Complaint: Hospital follow-up new onset atrial fibrillation, acute on chronic CHF, pneumonia due to COVID-19 virus.  Chronic kidney disease stage IV, morbid obesity, type 2 diabetes, hypertension, sleep apnea.  NSTEMI, CAD,  History of Present Illness: [name] [name] is a 74 y.o. male with a history of CAD, HLD, bilateral leg pain, CKD stage III, hyperlipidemia, NSTEMI, OSA, DM type II, morbid obesity, non-Hodgkin's lymphoma, hypothyroidism, morbid obesity.  Last encounter with Dr. Domenic Polite 04/04/2020.  He has been complaining of some bilateral leg pain and was told he needed to be evaluated for possible PAD.  He denied any anginal symptoms, palpitations or syncope.  Prior echocardiogram in April 2021 showed EF of 45 to 50% with moderate diastolic dysfunction.  Mildly reduced RV contraction, mild biatrial enlargement, no major valvular abnormalities.  Blood pressure was elevated during that visit and he stated this was in a similar range at home.  Presented to the ED 07/19/2020 with 2 to 3-day onset of increased shortness of breath which worsened on exertion with increased leg swelling.  He had previously been admitted on November 22 through June 29, 2020 where he left AMA due to acute respiratory failure secondary to COVID-19 infection and aspiration pneumonitis.  He was on 3 L nasal cannula oxygen at home.  He complained of chest congestion no difficulty bringing up sputum.  Had been using his MDIs with improvement in wheezing.  He had called his primary care provider that afternoon and was asked to go to the ED for further evaluation.  In emergency department blood pressure was elevated at 156/88.  O2 sat 90 to 95% on 4 L O2.  Troponin was 19.  Chest x-ray showed multifocal pneumonia  with bilateral trace pleural effusions.  IV Lasix 80 mg x 1 was given.  Hospitalist was asked to admit patient for further evaluation and management.  His CHA2DS2-VASc score was 4 equaling a 4.8% of annual risk for stroke.  He was to continue aspirin, Imdur, Zocor and bisoprolol.  Eventually left AMA to attend a family gathering.  Past Medical History:  Diagnosis Date  . CAD (coronary artery disease)    DES to LAD June 2018  . CKD (chronic kidney disease) stage 3, GFR 30-59 ml/min (HCC)   . COVID   . Essential hypertension   . History of pneumonia   . Hyperlipidemia   . Hypothyroidism   . LBBB (left bundle branch block)   . Morbid obesity ([place])   . Non Hodgkin's lymphoma ([place])    Status post XRT and chemotherapy  . NSTEMI (non-ST elevated myocardial infarction) [facility])    June 2018  . Peripheral neuropathy   . Sleep apnea   . Type 2 diabetes mellitus ([place])     Past Surgical History:  Procedure Laterality Date  . CHOLECYSTECTOMY  1992  . COLONOSCOPY N/A 09/03/2014   SLF:six colon polyps removed/small internal hemorrhoids  . CORONARY STENT INTERVENTION N/A 01/21/2017   Procedure: Coronary Stent Intervention;  Surgeon: Nelva Bush, MD;  Location: [facility];  Service: Cardiovascular;  Laterality: N/A;  . ESOPHAGOGASTRODUODENOSCOPY N/A 09/03/2014   SLF: mild gastritis/few gastric polyps  . LEFT HEART CATH AND CORONARY ANGIOGRAPHY N/A 01/20/2017   Procedure: Left Heart Cath and Coronary Angiography;  Surgeon: Jettie Booze, MD;  Location: [facility];  Service: Cardiovascular;  Laterality: N/A;  . TOTAL KNEE ARTHROPLASTY  11/10/2011   Procedure: TOTAL KNEE ARTHROPLASTY;  Surgeon: Mauri Pole, MD;  [facility];  Service: Orthopedics;  Laterality: Right;  . TOTAL KNEE ARTHROPLASTY Left 05/24/2018   Procedure: LEFT TOTAL KNEE ARTHROPLASTY;  Surgeon: Melrose Nakayama, MD;  Location: [facility];  Service: Orthopedics;  Laterality: Left;    Current Outpatient  Medications  Medication Sig Dispense Refill  . acetaminophen (TYLENOL) 325 MG tablet Take 2 tablets (650 mg total) by mouth every 6 (six) hours as needed for mild pain (or Fever >/= 101). 30 tablet 1  . albuterol (VENTOLIN HFA) 108 (90 Base) MCG/ACT inhaler Inhale 1-2 puffs into the lungs every 6 (six) hours as needed for wheezing or shortness of breath. 1 Inhaler 0  . amLODipine (NORVASC) 10 MG tablet Take 10 mg by mouth daily.    Marland Kitchen aspirin EC 81 MG tablet Take 1 tablet (81 mg total) by mouth daily with breakfast. Swallow whole. 30 tablet 11  . bisoprolol (ZEBETA) 5 MG tablet Take 2.5 mg by mouth daily.    . fluticasone (FLONASE) 50 MCG/ACT nasal spray Place 2 sprays into both nostrils daily for 14 days. (Patient taking differently: Place 2 sprays into both nostrils as needed. ) 1 g 0  . furosemide (LASIX) 40 MG tablet Take 40 mg by mouth daily.    . hydrALAZINE (APRESOLINE) 25 MG tablet Take 1 tablet (25 mg total) by mouth 2 (two) times daily. (Patient taking differently: Take 25 mg by mouth daily. ) 180 tablet 1  . insulin aspart (NOVOLOG) 100 UNIT/ML FlexPen See admin instructions. (Patient not taking: Reported on 06/24/2020)    . insulin degludec (TRESIBA FLEXTOUCH) 100 UNIT/ML SOPN FlexTouch Pen Inject 0.5 mLs (50 Units total) into the skin daily at 10 pm. (Patient taking differently: Inject 60 Units into the skin daily at 10 pm. )    . ipratropium-albuterol (DUONEB) 0.5-2.5 (3) MG/3ML SOLN Inhale 3 mLs into the lungs every 4 (four) hours as needed.     . isosorbide mononitrate (IMDUR) 30 MG 24 hr tablet Take 1 tablet (30 mg total) by mouth daily. 30 tablet 1  . levocetirizine (XYZAL) 5 MG tablet Take 5 mg by mouth every evening.     Marland Kitchen levothyroxine (SYNTHROID) 100 MCG tablet Take 100 mcg by mouth daily. (takes with 235mcg for a total of 341mcg)    . levothyroxine (SYNTHROID, LEVOTHROID) 200 MCG tablet Take 300 mcg by mouth daily before breakfast. (takes with 156mcg for a total of 362mcg)    .  nitroGLYCERIN (NITROSTAT) 0.4 MG SL tablet Place 0.4 mg under the tongue every 5 (five) minutes as needed for chest pain.   11  . OXYGEN Inhale 2 L into the lungs daily as needed.     . senna-docusate (SENOKOT-S) 8.6-50 MG tablet Take 1 tablet by mouth daily as needed.     . simvastatin (ZOCOR) 5 MG tablet Take 5 mg by mouth daily.    . SYMBICORT 160-4.5 MCG/ACT inhaler Inhale 2 puffs into the lungs daily.     No current facility-administered medications for this visit.   Allergies:  Patient has no known allergies.   Social History: The patient  reports that he has been smoking cigarettes. He has a 7.50 pack-year smoking history. He has never used smokeless tobacco. He reports that he does not drink alcohol and does not use drugs.   Family History: The patient's  family history includes Cancer in his father, maternal uncle, mother, and paternal uncle.   ROS:  Please see the history of present illness. Otherwise, complete review of systems is positive for none.  All other systems are reviewed and negative.   Physical Exam: VS:  There were no vitals taken for this visit., BMI There is no height or weight on file to calculate BMI.  Wt Readings from Last 3 Encounters:  07/20/20 (!) 308 lb 1.6 oz (139.8 kg)  06/24/20 296 lb 8.3 oz (134.5 kg)  04/04/20 (!) 303 lb (137.4 kg)    General: Patient appears comfortable at rest. HEENT: Conjunctiva and lids normal, oropharynx clear with moist mucosa. Neck: Supple, no elevated JVP or carotid bruits, no thyromegaly. Lungs: Clear to auscultation, nonlabored breathing at rest. Cardiac: Regular rate and rhythm, no S3 or significant systolic murmur, no pericardial rub. Abdomen: Soft, nontender, no hepatomegaly, bowel sounds present, no guarding or rebound. Extremities: No pitting edema, distal pulses 2+. Skin: Warm and dry. Musculoskeletal: No kyphosis. Neuropsychiatric: Alert and oriented x3, affect grossly appropriate.  ECG:  {EKG/Telemetry Strips  Reviewed:[phone]}  Recent Labwork: 06/27/2020: TSH 2.213 07/19/2020: B Natriuretic Peptide 182.0 07/20/2020: ALT 12; AST 15; BUN 34; Creatinine, Ser 2.50; Hemoglobin 15.8; Magnesium 2.3; Platelets 111; Potassium 3.3; Sodium 138     Component Value Date/Time   CHOL 95 06/25/2020 0607   TRIG 126 06/25/2020 0607   HDL 21 (L) 06/25/2020 0607   CHOLHDL 4.5 06/25/2020 0607   VLDL 25 06/25/2020 0607   LDLCALC 49 06/25/2020 0607    Other Studies Reviewed Today:  Chest x-ray 07/19/2020 IMPRESSION: Multifocal pneumonia with bilateral trace pleural effusions. Followup PA and lateral chest X-ray is recommended in 3-4 weeks following therapy to ensure resolution and exclude underlying malignancy.   Echocardiogram 07/20/2020 IMPRESSIONS 1. Left ventricular ejection fraction, by estimation, is 50 to 55%. The  left ventricle has low normal function. Left ventricular endocardial  border not optimally defined to evaluate regional wall motion. Left  ventricular diastolic parameters are  consistent with Grade II diastolic dysfunction (pseudonormalization).  Elevated left atrial pressure.  2. Right ventricular systolic function is normal. The right ventricular  size is normal.  3. Left atrial size was mildly dilated.  4. The mitral valve is normal in structure. Trivial mitral valve  regurgitation. No evidence of mitral stenosis.  5. The aortic valve is tricuspid. There is mild calcification of the  aortic valve. There is mild thickening of the aortic valve. Aortic valve  regurgitation is not visualized. No aortic stenosis is present.  6. The inferior vena cava is normal in size with greater than 50%  respiratory variability, suggesting right atrial pressure of 3 mmHg.      Echocardiogram 11/17/2019: 1. Left ventricular ejection fraction, by estimation, is 45 to 50%. The  left ventricle has mildly decreased function. The left ventricle  demonstrates global hypokinesis. The left  ventricular internal cavity size  was severely dilated. There is moderate  left ventricular hypertrophy of the septal segment. Left ventricular  diastolic parameters are consistent with Grade II diastolic dysfunction  (pseudonormalization). Elevated left ventricular end-diastolic pressure.  2. Right ventricular systolic function is mildly reduced. The right  ventricular size is mildly enlarged. There is normal pulmonary artery  systolic pressure.  3. Left atrial size was mildly dilated.  4. Right atrial size was mildly dilated.  5. The mitral valve is grossly normal. No evidence of mitral valve  regurgitation.  6. The aortic valve was not well  visualized. Aortic valve regurgitation  is trivial.  7. The inferior vena cava is normal in size with greater than 50%  respiratory variability, suggesting right atrial pressure of 3 mmHg  Assessment and Plan:  1. Atrial fibrillation, new onset ([place])   2. Coronary artery disease involving native coronary artery of native heart without angina pectoris   3. Bilateral leg pain   4. Essential hypertension   5. Stage 3b chronic kidney disease ([place])    1. Atrial fibrillation, new onset (HCC) ***  2. Coronary artery disease involving native coronary artery of native heart without angina pectoris ***  3. Bilateral leg pain ***  4. Essential hypertension ***  5. Stage 3b chronic kidney disease (HCC) ***  Medication Adjustments/Labs and Tests Ordered: Current medicines are reviewed at length with the patient today.  Concerns regarding medicines are outlined above.   Disposition: Follow-up with ***  Signed, Levell July, NP 07/24/2020 4:34 PM    [place] at [facility] [place], [place], [place] [address] Phone: [phone]; Fax: [phone]

## 2020-07-25 ENCOUNTER — Ambulatory Visit: Payer: Medicare HMO | Admitting: Family Medicine

## 2020-07-28 NOTE — Progress Notes (Deleted)
Cardiology Office Note  Date: 07/28/2020   ID: [name] [name], [name] [name] August 04, 1945, MRN [id number]  PCP:  [name]  Cardiologist:  Rozann Lesches, MD Electrophysiologist:  None   Chief Complaint: Hospital follow-up new onset atrial fibrillation, acute on chronic CHF, pneumonia due to COVID-19 virus.  Chronic kidney disease stage IV, morbid obesity, type 2 diabetes, hypertension, sleep apnea.  NSTEMI, CAD,  History of Present Illness: [name] [name] is a 74 y.o. male with a history of CAD, HLD, bilateral leg pain, CKD stage III, hyperlipidemia, NSTEMI, OSA, DM type II, morbid obesity, non-Hodgkin's lymphoma, hypothyroidism, morbid obesity.  Last encounter with Dr. Domenic Polite 04/04/2020.  He has been complaining of some bilateral leg pain and was told he needed to be evaluated for possible PAD.  He denied any anginal symptoms, palpitations or syncope.  Prior echocardiogram in April 2021 showed EF of 45 to 50% with moderate diastolic dysfunction.  Mildly reduced RV contraction, mild biatrial enlargement, no major valvular abnormalities.  Blood pressure was elevated during that visit and he stated this was in a similar range at home.  Presented to the ED 07/19/2020 with 2 to 3-day onset of increased shortness of breath which worsened on exertion with increased leg swelling.  He had previously been admitted on November 22 through June 29, 2020 where he left AMA due to acute respiratory failure secondary to COVID-19 infection and aspiration pneumonitis.  He was on 3 L nasal cannula oxygen at home.  He complained of chest congestion no difficulty bringing up sputum.  Had been using his MDIs with improvement in wheezing.  He had called his primary care provider that afternoon and was asked to go to the ED for further evaluation.  In emergency department blood pressure was elevated at 156/88.  O2 sat 90 to 95% on 4 L O2.  Troponin was 19.  Chest x-ray showed multifocal pneumonia  with bilateral trace pleural effusions.  IV Lasix 80 mg x 1 was given.  Hospitalist was asked to admit patient for further evaluation and management.  His CHA2DS2-VASc score was 4 equaling a 4.8% of annual risk for stroke.  He was to continue aspirin, Imdur, Zocor and bisoprolol.  Eventually left AMA to attend a family gathering.  Not on anticoagulant  Past Medical History:  Diagnosis Date  . CAD (coronary artery disease)    DES to LAD June 2018  . CKD (chronic kidney disease) stage 3, GFR 30-59 ml/min (HCC)   . COVID   . Essential hypertension   . History of pneumonia   . Hyperlipidemia   . Hypothyroidism   . LBBB (left bundle branch block)   . Morbid obesity ([place])   . Non Hodgkin's lymphoma ([place])    Status post XRT and chemotherapy  . NSTEMI (non-ST elevated myocardial infarction) [facility])    June 2018  . Peripheral neuropathy   . Sleep apnea   . Type 2 diabetes mellitus ([place])     Past Surgical History:  Procedure Laterality Date  . CHOLECYSTECTOMY  1992  . COLONOSCOPY N/A 09/03/2014   SLF:six colon polyps removed/small internal hemorrhoids  . CORONARY STENT INTERVENTION N/A 01/21/2017   Procedure: Coronary Stent Intervention;  Surgeon: Nelva Bush, MD;  Location: [facility];  Service: Cardiovascular;  Laterality: N/A;  . ESOPHAGOGASTRODUODENOSCOPY N/A 09/03/2014   SLF: mild gastritis/few gastric polyps  . LEFT HEART CATH AND CORONARY ANGIOGRAPHY N/A 01/20/2017   Procedure: Left Heart Cath and Coronary Angiography;  Surgeon:  Jettie Booze, MD;  Location: [facility];  Service: Cardiovascular;  Laterality: N/A;  . TOTAL KNEE ARTHROPLASTY  11/10/2011   Procedure: TOTAL KNEE ARTHROPLASTY;  Surgeon: Mauri Pole, MD;  Location: [facility];  Service: Orthopedics;  Laterality: Right;  . TOTAL KNEE ARTHROPLASTY Left 05/24/2018   Procedure: LEFT TOTAL KNEE ARTHROPLASTY;  Surgeon: Melrose Nakayama, MD;  Location: [facility];  Service: Orthopedics;  Laterality: Left;     Current Outpatient Medications  Medication Sig Dispense Refill  . acetaminophen (TYLENOL) 325 MG tablet Take 2 tablets (650 mg total) by mouth every 6 (six) hours as needed for mild pain (or Fever >/= 101). 30 tablet 1  . albuterol (VENTOLIN HFA) 108 (90 Base) MCG/ACT inhaler Inhale 1-2 puffs into the lungs every 6 (six) hours as needed for wheezing or shortness of breath. 1 Inhaler 0  . amLODipine (NORVASC) 10 MG tablet Take 10 mg by mouth daily.    Marland Kitchen aspirin EC 81 MG tablet Take 1 tablet (81 mg total) by mouth daily with breakfast. Swallow whole. 30 tablet 11  . bisoprolol (ZEBETA) 5 MG tablet Take 2.5 mg by mouth daily.    . fluticasone (FLONASE) 50 MCG/ACT nasal spray Place 2 sprays into both nostrils daily for 14 days. (Patient taking differently: Place 2 sprays into both nostrils as needed. ) 1 g 0  . furosemide (LASIX) 40 MG tablet Take 40 mg by mouth daily.    . hydrALAZINE (APRESOLINE) 25 MG tablet Take 1 tablet (25 mg total) by mouth 2 (two) times daily. (Patient taking differently: Take 25 mg by mouth daily. ) 180 tablet 1  . insulin aspart (NOVOLOG) 100 UNIT/ML FlexPen See admin instructions. (Patient not taking: Reported on 06/24/2020)    . insulin degludec (TRESIBA FLEXTOUCH) 100 UNIT/ML SOPN FlexTouch Pen Inject 0.5 mLs (50 Units total) into the skin daily at 10 pm. (Patient taking differently: Inject 60 Units into the skin daily at 10 pm. )    . ipratropium-albuterol (DUONEB) 0.5-2.5 (3) MG/3ML SOLN Inhale 3 mLs into the lungs every 4 (four) hours as needed.     . isosorbide mononitrate (IMDUR) 30 MG 24 hr tablet Take 1 tablet (30 mg total) by mouth daily. 30 tablet 1  . levocetirizine (XYZAL) 5 MG tablet Take 5 mg by mouth every evening.     Marland Kitchen levothyroxine (SYNTHROID) 100 MCG tablet Take 100 mcg by mouth daily. (takes with 216mcg for a total of 366mcg)    . levothyroxine (SYNTHROID, LEVOTHROID) 200 MCG tablet Take 300 mcg by mouth daily before breakfast. (takes with 146mcg for  a total of 376mcg)    . nitroGLYCERIN (NITROSTAT) 0.4 MG SL tablet Place 0.4 mg under the tongue every 5 (five) minutes as needed for chest pain.   11  . OXYGEN Inhale 2 L into the lungs daily as needed.     . senna-docusate (SENOKOT-S) 8.6-50 MG tablet Take 1 tablet by mouth daily as needed.     . simvastatin (ZOCOR) 5 MG tablet Take 5 mg by mouth daily.    . SYMBICORT 160-4.5 MCG/ACT inhaler Inhale 2 puffs into the lungs daily.     No current facility-administered medications for this visit.   Allergies:  Patient has no known allergies.   Social History: The patient  reports that he has been smoking cigarettes. He has a 7.50 pack-year smoking history. He has never used smokeless tobacco. He reports that he does not drink alcohol and does not use drugs.  Family History: The patient's family history includes Cancer in his father, maternal uncle, mother, and paternal uncle.   ROS:  Please see the history of present illness. Otherwise, complete review of systems is positive for none.  All other systems are reviewed and negative.   Physical Exam: VS:  There were no vitals taken for this visit., BMI There is no height or weight on file to calculate BMI.  Wt Readings from Last 3 Encounters:  07/20/20 (!) 308 lb 1.6 oz (139.8 kg)  06/24/20 296 lb 8.3 oz (134.5 kg)  04/04/20 (!) 303 lb (137.4 kg)    General: Patient appears comfortable at rest. HEENT: Conjunctiva and lids normal, oropharynx clear with moist mucosa. Neck: Supple, no elevated JVP or carotid bruits, no thyromegaly. Lungs: Clear to auscultation, nonlabored breathing at rest. Cardiac: Regular rate and rhythm, no S3 or significant systolic murmur, no pericardial rub. Abdomen: Soft, nontender, no hepatomegaly, bowel sounds present, no guarding or rebound. Extremities: No pitting edema, distal pulses 2+. Skin: Warm and dry. Musculoskeletal: No kyphosis. Neuropsychiatric: Alert and oriented x3, affect grossly appropriate.  ECG:   {EKG/Telemetry Strips Reviewed:([phone]}  Recent Labwork: 06/27/2020: TSH 2.213 07/19/2020: B Natriuretic Peptide 182.0 07/20/2020: ALT 12; AST 15; BUN 34; Creatinine, Ser 2.50; Hemoglobin 15.8; Magnesium 2.3; Platelets 111; Potassium 3.3; Sodium 138     Component Value Date/Time   CHOL 95 06/25/2020 0607   TRIG 126 06/25/2020 0607   HDL 21 (L) 06/25/2020 0607   CHOLHDL 4.5 06/25/2020 0607   VLDL 25 06/25/2020 0607   LDLCALC 49 06/25/2020 0607    Other Studies Reviewed Today:  Chest x-ray 07/19/2020 IMPRESSION: Multifocal pneumonia with bilateral trace pleural effusions. Followup PA and lateral chest X-ray is recommended in 3-4 weeks following therapy to ensure resolution and exclude underlying malignancy.   Echocardiogram 07/20/2020 IMPRESSIONS 1. Left ventricular ejection fraction, by estimation, is 50 to 55%. The  left ventricle has low normal function. Left ventricular endocardial  border not optimally defined to evaluate regional wall motion. Left  ventricular diastolic parameters are  consistent with Grade II diastolic dysfunction (pseudonormalization).  Elevated left atrial pressure.  2. Right ventricular systolic function is normal. The right ventricular  size is normal.  3. Left atrial size was mildly dilated.  4. The mitral valve is normal in structure. Trivial mitral valve  regurgitation. No evidence of mitral stenosis.  5. The aortic valve is tricuspid. There is mild calcification of the  aortic valve. There is mild thickening of the aortic valve. Aortic valve  regurgitation is not visualized. No aortic stenosis is present.  6. The inferior vena cava is normal in size with greater than 50%  respiratory variability, suggesting right atrial pressure of 3 mmHg.    Lower extremity arterial duplex / ABI 04/17/2020 Right: Resting right ankle-brachial index is within normal range. No evidence of significant right lower extremity arterial disease. The right  toe-brachial index is normal. Left: Resting left ankle-brachial index is within normal range. No evidence of significant left lower extremity arterial disease. The left toe-brachial index is normal.   Echocardiogram 11/17/2019: 1. Left ventricular ejection fraction, by estimation, is 45 to 50%. The  left ventricle has mildly decreased function. The left ventricle  demonstrates global hypokinesis. The left ventricular internal cavity size  was severely dilated. There is moderate  left ventricular hypertrophy of the septal segment. Left ventricular  diastolic parameters are consistent with Grade II diastolic dysfunction  (pseudonormalization). Elevated left ventricular end-diastolic pressure.  2. Right ventricular systolic  function is mildly reduced. The right  ventricular size is mildly enlarged. There is normal pulmonary artery  systolic pressure.  3. Left atrial size was mildly dilated.  4. Right atrial size was mildly dilated.  5. The mitral valve is grossly normal. No evidence of mitral valve  regurgitation.  6. The aortic valve was not well visualized. Aortic valve regurgitation  is trivial.  7. The inferior vena cava is normal in size with greater than 50%  respiratory variability, suggesting right atrial pressure of 3 mmHg  Assessment and Plan:   1. Atrial fibrillation, new onset (HCC) ***  2. Coronary artery disease involving native coronary artery of native heart without angina pectoris ***  3. Bilateral leg pain ***  4. Essential hypertension ***  5. Stage 3b chronic kidney disease (HCC) ***  Medication Adjustments/Labs and Tests Ordered: Current medicines are reviewed at length with the patient today.  Concerns regarding medicines are outlined above.   Disposition: Follow-up with ***  Signed, Levell July, NP 07/28/2020 7:18 PM    [place] [facility] [place], [place], [place] [address] Phone: [phone]; Fax:  [phone]

## 2020-07-29 ENCOUNTER — Ambulatory Visit: Payer: Medicare HMO | Admitting: Family Medicine

## 2020-07-30 NOTE — Progress Notes (Deleted)
Cardiology Office Note  Date: 07/31/2020   ID: [name] [name], [name] [name] 1946-03-31, MRN [id number]  PCP:  [name]  Cardiologist:  Rozann Lesches, MD Electrophysiologist:  None   Chief Complaint: Hospital follow-up new onset atrial fibrillation, acute on chronic CHF, pneumonia due to COVID-19 virus.  Chronic kidney disease stage IV, morbid obesity, type 2 diabetes, hypertension, sleep apnea.  NSTEMI, CAD.  History of Present Illness: [name] [name] is a 74 y.o. male with a history of CAD, HLD, bilateral leg pain, CKD stage III, hyperlipidemia, NSTEMI, OSA, DM type II, morbid obesity, non-Hodgkin's lymphoma, hypothyroidism, morbid obesity.  Last encounter with Dr. Domenic Polite 04/04/2020.  He had been complaining of some bilateral leg pain and was told he needed to be evaluated for possible PAD.  He denied any anginal symptoms, palpitations or syncope.  Prior echocardiogram in April 2021 showed EF of 45 to 50% with moderate diastolic dysfunction.  Mildly reduced RV contraction, mild biatrial enlargement, no major valvular abnormalities.  Blood pressure was elevated during that visit and he stated this was in a similar range at home.  Presented to the ED 07/19/2020 with 2 to 3-day onset of increased shortness of breath which worsened on exertion with increased leg swelling.  He had previously been admitted on November 22 through June 29, 2020 where he left AMA.  He had acute respiratory failure secondary to COVID-19 infection and aspiration pneumonitis.  He was on 3 L nasal cannula oxygen at home.  He complained of chest congestion.  No difficulty bringing up sputum.  Had been using his MDIs with improvement in wheezing.  He had called his primary care provider that afternoon and was asked to go to the ED for further evaluation.  In emergency department blood pressure was elevated at 156/88.  O2 sat 90 to 95% on 4 L O2.  Troponin was 19.  Chest x-ray showed multifocal  pneumonia with bilateral trace pleural effusions.  IV Lasix 80 mg x 1 was given.  Hospitalist was asked to admit patient for further evaluation and management.  His CHA2DS2-VASc score was 4 equaling a 4.8% of annual risk for stroke.  He was to continue aspirin, Imdur, Zocor and bisoprolol.  Eventually left AMA to attend a family gathering.  He is here status post recent hospital admission for Covid pneumonia, CKD stage IV, type 2 diabetes, hypertension, sleep apnea, CAD, exacerbation of CHF and new onset atrial fibrillation.  EKG shows atrial fibrillation with wide QRS left axis deviation, left bundle branch block rate of 71.  Patient is on O2 today at 4 L/min.  Oxygen saturation is 93%.  Blood pressure is 118/64 with pulse rate of 73 and respiratory rate of 16.  He is in a wheelchair due to significant issues with weakness and debility secondary to recent hospital admission.  He was not started on anticoagulation during hospital visit for his new onset atrial fibrillation.  He was unable to stand on scales today due to significant weakness and had trouble getting out of his automobile into a wheelchair.  Past Medical History:  Diagnosis Date  . CAD (coronary artery disease)    DES to LAD June 2018  . CKD (chronic kidney disease) stage 3, GFR 30-59 ml/min (HCC)   . COVID   . Essential hypertension   . History of pneumonia   . Hyperlipidemia   . Hypothyroidism   . LBBB (left bundle branch block)   . Morbid obesity ([place])   .  Non Hodgkin's lymphoma ([place])    Status post XRT and chemotherapy  . NSTEMI (non-ST elevated myocardial infarction) [facility])    June 2018  . Peripheral neuropathy   . Sleep apnea   . Type 2 diabetes mellitus ([place])     Past Surgical History:  Procedure Laterality Date  . CHOLECYSTECTOMY  1992  . COLONOSCOPY N/A 09/03/2014   SLF:six colon polyps removed/small internal hemorrhoids  . CORONARY STENT INTERVENTION N/A 01/21/2017   Procedure: Coronary Stent Intervention;  Surgeon:  Nelva Bush, MD;  Location: [facility];  Service: Cardiovascular;  Laterality: N/A;  . ESOPHAGOGASTRODUODENOSCOPY N/A 09/03/2014   SLF: mild gastritis/few gastric polyps  . LEFT HEART CATH AND CORONARY ANGIOGRAPHY N/A 01/20/2017   Procedure: Left Heart Cath and Coronary Angiography;  Surgeon: Jettie Booze, MD;  Location: [facility];  Service: Cardiovascular;  Laterality: N/A;  . TOTAL KNEE ARTHROPLASTY  11/10/2011   Procedure: TOTAL KNEE ARTHROPLASTY;  Surgeon: Mauri Pole, MD;  Location: [facility];  Service: Orthopedics;  Laterality: Right;  . TOTAL KNEE ARTHROPLASTY Left 05/24/2018   Procedure: LEFT TOTAL KNEE ARTHROPLASTY;  Surgeon: Melrose Nakayama, MD;  Location: [facility];  Service: Orthopedics;  Laterality: Left;    Current Outpatient Medications  Medication Sig Dispense Refill  . acetaminophen (TYLENOL) 325 MG tablet Take 2 tablets (650 mg total) by mouth every 6 (six) hours as needed for mild pain (or Fever >/= 101). 30 tablet 1  . albuterol (VENTOLIN HFA) 108 (90 Base) MCG/ACT inhaler Inhale 1-2 puffs into the lungs every 6 (six) hours as needed for wheezing or shortness of breath. 1 Inhaler 0  . amLODipine (NORVASC) 10 MG tablet Take 10 mg by mouth daily.    Marland Kitchen aspirin EC 81 MG tablet Take 1 tablet (81 mg total) by mouth daily with breakfast. Swallow whole. 30 tablet 11  . bisoprolol (ZEBETA) 5 MG tablet Take 2.5 mg by mouth daily.    . furosemide (LASIX) 40 MG tablet Take 40 mg by mouth daily.    . insulin aspart (NOVOLOG) 100 UNIT/ML FlexPen See admin instructions.    . insulin degludec (TRESIBA FLEXTOUCH) 100 UNIT/ML SOPN FlexTouch Pen Inject 0.5 mLs (50 Units total) into the skin daily at 10 pm. (Patient taking differently: Inject 60 Units into the skin daily at 10 pm.)    . ipratropium-albuterol (DUONEB) 0.5-2.5 (3) MG/3ML SOLN Inhale 3 mLs into the lungs every 4 (four) hours as needed.     . isosorbide mononitrate (IMDUR) 30 MG 24 hr tablet Take 1 tablet (30  mg total) by mouth daily. 30 tablet 1  . levocetirizine (XYZAL) 5 MG tablet Take 5 mg by mouth every evening.     Marland Kitchen levothyroxine (SYNTHROID) 100 MCG tablet Take 100 mcg by mouth daily. (takes with 259mcg for a total of 372mcg)    . levothyroxine (SYNTHROID, LEVOTHROID) 200 MCG tablet Take 300 mcg by mouth daily before breakfast. (takes with 19mcg for a total of 369mcg)    . nitroGLYCERIN (NITROSTAT) 0.4 MG SL tablet Place 0.4 mg under the tongue every 5 (five) minutes as needed for chest pain.   11  . OXYGEN Inhale 2 L into the lungs daily as needed.     . senna-docusate (SENOKOT-S) 8.6-50 MG tablet Take 1 tablet by mouth daily as needed.     . simvastatin (ZOCOR) 5 MG tablet Take 5 mg by mouth daily.    . SYMBICORT 160-4.5 MCG/ACT inhaler Inhale 2 puffs into the lungs  daily.    . fluticasone (FLONASE) 50 MCG/ACT nasal spray Place 2 sprays into both nostrils daily for 14 days. (Patient taking differently: Place 2 sprays into both nostrils as needed. ) 1 g 0  . hydrALAZINE (APRESOLINE) 25 MG tablet Take 1 tablet (25 mg total) by mouth 2 (two) times daily. (Patient taking differently: Take 25 mg by mouth daily. ) 180 tablet 1   No current facility-administered medications for this visit.   Allergies:  Patient has no known allergies.   Social History: The patient  reports that he quit smoking about 13 years ago. His smoking use included cigarettes. He has a 7.50 pack-year smoking history. He has never used smokeless tobacco. He reports that he does not drink alcohol and does not use drugs.   Family History: The patient's family history includes Cancer in his father, maternal uncle, mother, and paternal uncle.   ROS:  Please see the history of present illness. Otherwise, complete review of systems is positive for none.  All other systems are reviewed and negative.   Physical Exam: VS:  BP 118/64   Pulse 73   Resp 16   SpO2 93% , BMI There is no height or weight on file to calculate BMI.  Wt  Readings from Last 3 Encounters:  07/20/20 (!) 308 lb 1.6 oz (139.8 kg)  06/24/20 296 lb 8.3 oz (134.5 kg)  04/04/20 (!) 303 lb (137.4 kg)    General: Severely morbidly obese patient appears comfortable at rest. Neck: Supple, no elevated JVP or carotid bruits, no thyromegaly. Lungs: Clear to auscultation, nonlabored breathing at rest. Cardiac: Regular rate and rhythm, no S3 or significant systolic murmur, no pericardial rub. Extremities: No pitting edema, distal pulses 2+. Skin: Warm and dry. Musculoskeletal: No kyphosis. Neuropsychiatric: Alert and oriented x3, affect grossly appropriate.  ECG:  An ECG dated 07/31/2020 was personally reviewed today and demonstrated:  Atrial fibrillation with left axis deviation, left bundle branch block rate of 71  Recent Labwork: 06/27/2020: TSH 2.213 07/19/2020: B Natriuretic Peptide 182.0 07/20/2020: ALT 12; AST 15; BUN 34; Creatinine, Ser 2.50; Hemoglobin 15.8; Magnesium 2.3; Platelets 111; Potassium 3.3; Sodium 138     Component Value Date/Time   CHOL 95 06/25/2020 0607   TRIG 126 06/25/2020 0607   HDL 21 (L) 06/25/2020 0607   CHOLHDL 4.5 06/25/2020 0607   VLDL 25 06/25/2020 0607   LDLCALC 49 06/25/2020 0607    Other Studies Reviewed Today:  Chest x-ray 07/19/2020 IMPRESSION: Multifocal pneumonia with bilateral trace pleural effusions. Followup PA and lateral chest X-ray is recommended in 3-4 weeks following therapy to ensure resolution and exclude underlying malignancy.   Echocardiogram 07/20/2020 IMPRESSIONS 1. Left ventricular ejection fraction, by estimation, is 50 to 55%. The  left ventricle has low normal function. Left ventricular endocardial  border not optimally defined to evaluate regional wall motion. Left  ventricular diastolic parameters are  consistent with Grade II diastolic dysfunction (pseudonormalization).  Elevated left atrial pressure.  2. Right ventricular systolic function is normal. The right ventricular   size is normal.  3. Left atrial size was mildly dilated.  4. The mitral valve is normal in structure. Trivial mitral valve  regurgitation. No evidence of mitral stenosis.  5. The aortic valve is tricuspid. There is mild calcification of the  aortic valve. There is mild thickening of the aortic valve. Aortic valve  regurgitation is not visualized. No aortic stenosis is present.  6. The inferior vena cava is normal in size with greater than  50%  respiratory variability, suggesting right atrial pressure of 3 mmHg.    Lower extremity arterial duplex / ABI 04/17/2020 Right: Resting right ankle-brachial index is within normal range. No evidence of significant right lower extremity arterial disease. The right toe-brachial index is normal. Left: Resting left ankle-brachial index is within normal range. No evidence of significant left lower extremity arterial disease. The left toe-brachial index is normal.   Echocardiogram 11/17/2019: 1. Left ventricular ejection fraction, by estimation, is 45 to 50%. The  left ventricle has mildly decreased function. The left ventricle  demonstrates global hypokinesis. The left ventricular internal cavity size  was severely dilated. There is moderate  left ventricular hypertrophy of the septal segment. Left ventricular  diastolic parameters are consistent with Grade II diastolic dysfunction  (pseudonormalization). Elevated left ventricular end-diastolic pressure.  2. Right ventricular systolic function is mildly reduced. The right  ventricular size is mildly enlarged. There is normal pulmonary artery  systolic pressure.  3. Left atrial size was mildly dilated.  4. Right atrial size was mildly dilated.  5. The mitral valve is grossly normal. No evidence of mitral valve  regurgitation.  6. The aortic valve was not well visualized. Aortic valve regurgitation  is trivial.  7. The inferior vena cava is normal in size with greater than 50%   respiratory variability, suggesting right atrial pressure of 3 mmHg  Assessment and Plan:   1. Atrial fibrillation, new onset ([place]) EKG today shows atrial fibrillation with left axis deviation, left bundle branch block, wide QRS rate of 71.  Discontinue aspirin.  Start Eliquis 2.5 mg p.o. twice daily  Due to decreased renal function.  Continue bisoprolol 2.5 mg p.o. daily  2. Coronary artery disease involving native coronary artery of native heart without angina pectoris Denies any anginal symptoms today.  Continue Imdur 30 mg daily.  Aspirin was stopped and Eliquis started for systemic anticoagulation for new onset atrial fibrillation.  Continue nitroglycerin sublingual as needed.  Continue simvastatin 5 mg daily.  3. Essential hypertension Blood pressure well controlled on current therapy continue amlodipine 10 mg daily, bisoprolol 2.5 mg daily.  Continue hydralazine 25 mg p.o. twice daily.   5. Stage 3b chronic kidney disease [facility]) Patient states he sees a nephrologist in [facility].  Advised him to schedule a follow-up appointment with nephrology.      6.  Pleural effusion/status post Covid pneumonia Please get a repeat chest x-ray and 2 weeks around January 14 for recheck of pleural effusions and status post Covid pneumonia.   7.  Acute on chronic congestive heart failure. Acute on chronic congestive heart failure improved after diuresis during recent hospital stay.  Unable to obtain weight today due to severe debilitation and patient unable to stand for short period.  His discharge weight was 306 at recent hospital stay.  He was net -3125 mL at discharge. Recent echocardiogram on 07/20/2020 demonstrated EF of 50 to 55%.  G2 DD, LA mildly dilated trivial MR.  Remains on continued O2 at 4 L/min   Medication Adjustments/Labs and Tests Ordered: Current medicines are reviewed at length with the patient today.  Concerns regarding medicines are outlined above.   Disposition: Follow-up  with Dr. Domenic Polite or APP 1 month  Signed, Levell July, NP 07/31/2020 10:24 AM    [facility], [place], [place] [address] Phone: [phone]; Fax: [phone]

## 2020-07-31 ENCOUNTER — Encounter: Payer: Self-pay | Admitting: Family Medicine

## 2020-07-31 ENCOUNTER — Ambulatory Visit (INDEPENDENT_AMBULATORY_CARE_PROVIDER_SITE_OTHER): Payer: Medicare HMO | Admitting: Family Medicine

## 2020-07-31 ENCOUNTER — Other Ambulatory Visit: Payer: Self-pay

## 2020-07-31 ENCOUNTER — Telehealth: Payer: Self-pay

## 2020-07-31 VITALS — BP 118/64 | HR 73 | Resp 16

## 2020-07-31 DIAGNOSIS — J90 Pleural effusion, not elsewhere classified: Secondary | ICD-10-CM

## 2020-07-31 DIAGNOSIS — I480 Paroxysmal atrial fibrillation: Secondary | ICD-10-CM

## 2020-07-31 MED ORDER — APIXABAN 2.5 MG PO TABS: 2.5000 mg | ORAL_TABLET | Freq: Two times a day (BID) | ORAL | 6 refills | Status: DC

## 2020-07-31 NOTE — Progress Notes (Signed)
Cardiology Office Note  Date: 07/31/2020   ID: [name] [name], [name] [name] Jun 13, 1946, MRN [id number]  PCP:  [name], Anna At  Cardiologist:  Rozann Lesches, MD Electrophysiologist:  None   Chief Complaint: Hospital follow-up new onset atrial fibrillation, acute on chronic CHF, pneumonia due to COVID-19 virus.  Chronic kidney disease stage IV, morbid obesity, type 2 diabetes, hypertension, sleep apnea.  NSTEMI, CAD.  History of Present Illness: [name] [name] is a 74 y.o. male with a history of CAD, HLD, bilateral leg pain, CKD stage III, hyperlipidemia, NSTEMI, OSA, DM type II, morbid obesity, non-Hodgkin's lymphoma, hypothyroidism, morbid obesity.  Last encounter with Dr. Domenic Polite 04/04/2020.  He had been complaining of some bilateral leg pain and was told he needed to be evaluated for possible PAD.  He denied any anginal symptoms, palpitations or syncope.  Prior echocardiogram in April 2021 showed EF of 45 to 50% with moderate diastolic dysfunction.  Mildly reduced RV contraction, mild biatrial enlargement, no major valvular abnormalities.  Blood pressure was elevated during that visit and he stated this was in a similar range at home.  Presented to the ED 07/19/2020 with 2 to 3-day onset of increased shortness of breath which worsened on exertion with increased leg swelling.  He had previously been admitted on November 22 through June 29, 2020 where he left AMA.  He had acute respiratory failure secondary to COVID-19 infection and aspiration pneumonitis.  He was on 3 L nasal cannula oxygen at home.  He complained of chest congestion.  No difficulty bringing up sputum.  Had been using his MDIs with improvement in wheezing.  He had called his primary care provider that afternoon and was asked to go to the ED for further evaluation.  In emergency department blood pressure was elevated at 156/88.  O2 sat 90 to 95% on 4 L O2.  Troponin was 19.  Chest x-ray showed multifocal  pneumonia with bilateral trace pleural effusions.  IV Lasix 80 mg x 1 was given.  Hospitalist was asked to admit patient for further evaluation and management.  His CHA2DS2-VASc score was 4 equaling a 4.8% of annual risk for stroke.  He was to continue aspirin, Imdur, Zocor and bisoprolol.  Eventually left AMA to attend a family gathering.  He is here status post recent hospital admission for Covid pneumonia, CKD stage IV, type 2 diabetes, hypertension, sleep apnea, CAD, exacerbation of CHF and new onset atrial fibrillation.  EKG shows atrial fibrillation with wide QRS left axis deviation, left bundle branch block rate of 71.  Patient is on O2 today at 4 L/min.  Oxygen saturation is 93%.  Blood pressure is 118/64 with pulse rate of 73 and respiratory rate of 16.  He is in a wheelchair due to significant issues with weakness and debility secondary to recent hospital admission.  He was not started on anticoagulation during hospital visit for his new onset atrial fibrillation.  He was unable to stand on scales today due to significant weakness and had trouble getting out of his automobile into a wheelchair.  Past Medical History:  Diagnosis Date  . CAD (coronary artery disease)    DES to LAD June 2018  . CKD (chronic kidney disease) stage 3, GFR 30-59 ml/min (HCC)   . COVID   . Essential hypertension   . History of pneumonia   . Hyperlipidemia   . Hypothyroidism   . LBBB (left bundle branch block)   . Morbid obesity ([place])   .  Non Hodgkin's lymphoma ([place])    Status post XRT and chemotherapy  . NSTEMI (non-ST elevated myocardial infarction) [facility])    June 2018  . Peripheral neuropathy   . Sleep apnea   . Type 2 diabetes mellitus ([place])     Past Surgical History:  Procedure Laterality Date  . CHOLECYSTECTOMY  1992  . COLONOSCOPY N/A 09/03/2014   SLF:six colon polyps removed/small internal hemorrhoids  . CORONARY STENT INTERVENTION N/A 01/21/2017   Procedure: Coronary Stent Intervention;  Surgeon:  Nelva Bush, MD;  Location: [facility];  Service: Cardiovascular;  Laterality: N/A;  . ESOPHAGOGASTRODUODENOSCOPY N/A 09/03/2014   SLF: mild gastritis/few gastric polyps  . LEFT HEART CATH AND CORONARY ANGIOGRAPHY N/A 01/20/2017   Procedure: Left Heart Cath and Coronary Angiography;  Surgeon: Jettie Booze, MD;  Location: [facility];  Service: Cardiovascular;  Laterality: N/A;  . TOTAL KNEE ARTHROPLASTY  11/10/2011   Procedure: TOTAL KNEE ARTHROPLASTY;  Surgeon: Mauri Pole, MD;  Location: [facility];  Service: Orthopedics;  Laterality: Right;  . TOTAL KNEE ARTHROPLASTY Left 05/24/2018   Procedure: LEFT TOTAL KNEE ARTHROPLASTY;  Surgeon: Melrose Nakayama, MD;  Location: [facility];  Service: Orthopedics;  Laterality: Left;    Current Outpatient Medications  Medication Sig Dispense Refill  . acetaminophen (TYLENOL) 325 MG tablet Take 2 tablets (650 mg total) by mouth every 6 (six) hours as needed for mild pain (or Fever >/= 101). 30 tablet 1  . albuterol (VENTOLIN HFA) 108 (90 Base) MCG/ACT inhaler Inhale 1-2 puffs into the lungs every 6 (six) hours as needed for wheezing or shortness of breath. 1 Inhaler 0  . amLODipine (NORVASC) 10 MG tablet Take 10 mg by mouth daily.    Marland Kitchen apixaban (ELIQUIS) 2.5 MG TABS tablet Take 1 tablet (2.5 mg total) by mouth 2 (two) times daily. 60 tablet 6  . bisoprolol (ZEBETA) 5 MG tablet Take 2.5 mg by mouth daily.    . furosemide (LASIX) 40 MG tablet Take 40 mg by mouth daily.    . insulin aspart (NOVOLOG) 100 UNIT/ML FlexPen See admin instructions.    . insulin degludec (TRESIBA FLEXTOUCH) 100 UNIT/ML SOPN FlexTouch Pen Inject 0.5 mLs (50 Units total) into the skin daily at 10 pm. (Patient taking differently: Inject 60 Units into the skin daily at 10 pm.)    . ipratropium-albuterol (DUONEB) 0.5-2.5 (3) MG/3ML SOLN Inhale 3 mLs into the lungs every 4 (four) hours as needed.     . isosorbide mononitrate (IMDUR) 30 MG 24 hr tablet Take 1 tablet (30 mg  total) by mouth daily. 30 tablet 1  . levocetirizine (XYZAL) 5 MG tablet Take 5 mg by mouth every evening.     Marland Kitchen levothyroxine (SYNTHROID) 100 MCG tablet Take 100 mcg by mouth daily. (takes with 244mcg for a total of 387mcg)    . levothyroxine (SYNTHROID, LEVOTHROID) 200 MCG tablet Take 300 mcg by mouth daily before breakfast. (takes with 13mcg for a total of 34mcg)    . nitroGLYCERIN (NITROSTAT) 0.4 MG SL tablet Place 0.4 mg under the tongue every 5 (five) minutes as needed for chest pain.   11  . OXYGEN Inhale 2 L into the lungs daily as needed.     . senna-docusate (SENOKOT-S) 8.6-50 MG tablet Take 1 tablet by mouth daily as needed.     . simvastatin (ZOCOR) 5 MG tablet Take 5 mg by mouth daily.    . SYMBICORT 160-4.5 MCG/ACT inhaler Inhale 2 puffs into the lungs  daily.    . fluticasone (FLONASE) 50 MCG/ACT nasal spray Place 2 sprays into both nostrils daily for 14 days. (Patient taking differently: Place 2 sprays into both nostrils as needed. ) 1 g 0  . hydrALAZINE (APRESOLINE) 25 MG tablet Take 1 tablet (25 mg total) by mouth 2 (two) times daily. (Patient taking differently: Take 25 mg by mouth daily. ) 180 tablet 1   No current facility-administered medications for this visit.   Allergies:  Patient has no known allergies.   Social History: The patient  reports that he quit smoking about 13 years ago. His smoking use included cigarettes. He has a 7.50 pack-year smoking history. He has never used smokeless tobacco. He reports that he does not drink alcohol and does not use drugs.   Family History: The patient's family history includes Cancer in his father, maternal uncle, mother, and paternal uncle.   ROS:  Please see the history of present illness. Otherwise, complete review of systems is positive for none.  All other systems are reviewed and negative.   Physical Exam: VS:  BP 118/64   Pulse 73   Resp 16   SpO2 93% , BMI There is no height or weight on file to calculate BMI.  Wt  Readings from Last 3 Encounters:  07/20/20 (!) 308 lb 1.6 oz (139.8 kg)  06/24/20 296 lb 8.3 oz (134.5 kg)  04/04/20 (!) 303 lb (137.4 kg)    General: Severely morbidly obese patient appears comfortable at rest. Neck: Supple, no elevated JVP or carotid bruits, no thyromegaly. Lungs: Clear to auscultation, nonlabored breathing at rest. Cardiac: Regular rate and rhythm, no S3 or significant systolic murmur, no pericardial rub. Extremities: No pitting edema, distal pulses 2+. Skin: Warm and dry. Musculoskeletal: No kyphosis. Neuropsychiatric: Alert and oriented x3, affect grossly appropriate.  ECG:  An ECG dated 07/31/2020 was personally reviewed today and demonstrated:  Atrial fibrillation with left axis deviation, left bundle branch block rate of 71  Recent Labwork: 06/27/2020: TSH 2.213 07/19/2020: B Natriuretic Peptide 182.0 07/20/2020: ALT 12; AST 15; BUN 34; Creatinine, Ser 2.50; Hemoglobin 15.8; Magnesium 2.3; Platelets 111; Potassium 3.3; Sodium 138     Component Value Date/Time   CHOL 95 06/25/2020 0607   TRIG 126 06/25/2020 0607   HDL 21 (L) 06/25/2020 0607   CHOLHDL 4.5 06/25/2020 0607   VLDL 25 06/25/2020 0607   LDLCALC 49 06/25/2020 0607    Other Studies Reviewed Today:  Chest x-ray 07/19/2020 IMPRESSION: Multifocal pneumonia with bilateral trace pleural effusions. Followup PA and lateral chest X-ray is recommended in 3-4 weeks following therapy to ensure resolution and exclude underlying malignancy.   Echocardiogram 07/20/2020 IMPRESSIONS 1. Left ventricular ejection fraction, by estimation, is 50 to 55%. The  left ventricle has low normal function. Left ventricular endocardial  border not optimally defined to evaluate regional wall motion. Left  ventricular diastolic parameters are  consistent with Grade II diastolic dysfunction (pseudonormalization).  Elevated left atrial pressure.  2. Right ventricular systolic function is normal. The right ventricular   size is normal.  3. Left atrial size was mildly dilated.  4. The mitral valve is normal in structure. Trivial mitral valve  regurgitation. No evidence of mitral stenosis.  5. The aortic valve is tricuspid. There is mild calcification of the  aortic valve. There is mild thickening of the aortic valve. Aortic valve  regurgitation is not visualized. No aortic stenosis is present.  6. The inferior vena cava is normal in size with greater than  50%  respiratory variability, suggesting right atrial pressure of 3 mmHg.    Lower extremity arterial duplex / ABI 04/17/2020 Right: Resting right ankle-brachial index is within normal range. No evidence of significant right lower extremity arterial disease. The right toe-brachial index is normal. Left: Resting left ankle-brachial index is within normal range. No evidence of significant left lower extremity arterial disease. The left toe-brachial index is normal.   Echocardiogram 11/17/2019: 1. Left ventricular ejection fraction, by estimation, is 45 to 50%. The  left ventricle has mildly decreased function. The left ventricle  demonstrates global hypokinesis. The left ventricular internal cavity size  was severely dilated. There is moderate  left ventricular hypertrophy of the septal segment. Left ventricular  diastolic parameters are consistent with Grade II diastolic dysfunction  (pseudonormalization). Elevated left ventricular end-diastolic pressure.  2. Right ventricular systolic function is mildly reduced. The right  ventricular size is mildly enlarged. There is normal pulmonary artery  systolic pressure.  3. Left atrial size was mildly dilated.  4. Right atrial size was mildly dilated.  5. The mitral valve is grossly normal. No evidence of mitral valve  regurgitation.  6. The aortic valve was not well visualized. Aortic valve regurgitation  is trivial.  7. The inferior vena cava is normal in size with greater than 50%   respiratory variability, suggesting right atrial pressure of 3 mmHg  Assessment and Plan:   1. Atrial fibrillation, new onset ([place]) EKG today shows atrial fibrillation with left axis deviation, left bundle branch block, wide QRS rate of 71.  Discontinue aspirin.  Start Eliquis 2.5 mg p.o. twice daily  Due to decreased renal function.  Continue bisoprolol 2.5 mg p.o. daily  2. Coronary artery disease involving native coronary artery of native heart without angina pectoris Denies any anginal symptoms today.  Continue Imdur 30 mg daily.  Aspirin was stopped and Eliquis started for systemic anticoagulation for new onset atrial fibrillation.  Continue nitroglycerin sublingual as needed.  Continue simvastatin 5 mg daily.  3. Essential hypertension Blood pressure well controlled on current therapy continue amlodipine 10 mg daily, bisoprolol 2.5 mg daily.  Continue hydralazine 25 mg p.o. twice daily.   5. Stage 3b chronic kidney disease [facility]) Patient states he sees a nephrologist in [facility].  Advised him to schedule a follow-up appointment with nephrology.      6.  Pleural effusion/status post Covid pneumonia Please get a repeat chest x-ray and 2 weeks around January 14 for recheck of pleural effusions and status post Covid pneumonia.   7.  Acute on chronic congestive heart failure. Acute on chronic congestive heart failure improved after diuresis during recent hospital stay.  Unable to obtain weight today due to severe debilitation and patient unable to stand for short period.  His discharge weight was 306 at recent hospital stay.  He was net -3125 mL at discharge. Recent echocardiogram on 07/20/2020 demonstrated EF of 50 to 55%.  G2 DD, LA mildly dilated trivial MR.  Remains on continued O2 at 4 L/min   Medication Adjustments/Labs and Tests Ordered: Current medicines are reviewed at length with the patient today.  Concerns regarding medicines are outlined above.   Disposition: Follow-up  with Dr. Domenic Polite or APP 1 month  Signed, Levell July, NP 07/31/2020 12:53 PM    [facility] at [place], [place], [place] [address] Phone: [phone]; Fax: [phone]

## 2020-07-31 NOTE — Telephone Encounter (Signed)
Patient was seen this morning and forgot to ask for a referral to a pulmonary doctor ph# [phone]

## 2020-07-31 NOTE — Patient Instructions (Addendum)
Medication Instructions:   STOP ASPIRIN    START Eliquis 2.5 mg twice a day    I gave you a FREE 30 day TRIAL card.Give to Pharmacist when you get your medication  *If you need a refill on your cardiac medications before your next appointment, please call your pharmacy*   Lab Work: None today  If you have labs (blood work) drawn today and your tests are completely normal, you will receive your results only by: Marland Kitchen MyChart Message (if you have MyChart) OR . A paper copy in the mail If you have any lab test that is abnormal or we need to change your treatment, we will call you to review the results.   Testing/Procedures:  Repeat chest x-ray at [facility] the week of 1/10-1/14/21   Follow-Up: At [facility], you and your health needs are our priority.  As part of our continuing mission to provide you with exceptional heart care, we have created designated Provider Care Teams.  These Care Teams include your primary Cardiologist (physician) and Advanced Practice Providers (APPs -  Physician Assistants and Nurse Practitioners) who all work together to provide you with the care you need, when you need it.  We recommend signing up for the patient portal called "MyChart".  Sign up information is provided on this After Visit Summary.  MyChart is used to connect with patients for Virtual Visits (Telemedicine).  Patients are able to view lab/test results, encounter notes, upcoming appointments, etc.  Non-urgent messages can be sent to your provider as well.   To learn more about what you can do with MyChart, go to [email or web address].    Your next appointment:   1 month(s)  The format for your next appointment:   In Person  Provider:   Katina Dung, NP   Other Instructions None      Thank you for choosing [facility] !

## 2020-07-31 NOTE — Telephone Encounter (Signed)
I spoke with A.Quinn,NP and he states he told patient to f/u with his nephrologist. Patient said he understood that and will get CXR on 08/16/20 and f/u will be determined then.

## 2020-08-04 ENCOUNTER — Emergency Department (HOSPITAL_COMMUNITY): Payer: Medicare HMO

## 2020-08-04 ENCOUNTER — Other Ambulatory Visit: Payer: Self-pay

## 2020-08-04 ENCOUNTER — Encounter (HOSPITAL_COMMUNITY): Payer: Self-pay | Admitting: Emergency Medicine

## 2020-08-04 ENCOUNTER — Inpatient Hospital Stay (HOSPITAL_COMMUNITY)
Admission: EM | Admit: 2020-08-04 | Discharge: 2020-08-10 | DRG: 291 | Disposition: A | Discharge status: Home Health | Payer: Medicare HMO | Attending: Family Medicine | Admitting: Family Medicine

## 2020-08-04 DIAGNOSIS — Z7901 Long term (current) use of anticoagulants: Secondary | ICD-10-CM | POA: Diagnosis not present

## 2020-08-04 DIAGNOSIS — E039 Hypothyroidism, unspecified: Secondary | ICD-10-CM | POA: Diagnosis present

## 2020-08-04 DIAGNOSIS — Z20822 Contact with and (suspected) exposure to covid-19: Secondary | ICD-10-CM | POA: Diagnosis present

## 2020-08-04 DIAGNOSIS — I130 Hypertensive heart and chronic kidney disease with heart failure and stage 1 through stage 4 chronic kidney disease, or unspecified chronic kidney disease: Principal | ICD-10-CM | POA: Diagnosis present

## 2020-08-04 DIAGNOSIS — Z79899 Other long term (current) drug therapy: Secondary | ICD-10-CM

## 2020-08-04 DIAGNOSIS — J441 Chronic obstructive pulmonary disease with (acute) exacerbation: Secondary | ICD-10-CM | POA: Diagnosis present

## 2020-08-04 DIAGNOSIS — Z955 Presence of coronary angioplasty implant and graft: Secondary | ICD-10-CM

## 2020-08-04 DIAGNOSIS — G9341 Metabolic encephalopathy: Secondary | ICD-10-CM | POA: Diagnosis present

## 2020-08-04 DIAGNOSIS — N189 Chronic kidney disease, unspecified: Secondary | ICD-10-CM | POA: Diagnosis present

## 2020-08-04 DIAGNOSIS — G4730 Sleep apnea, unspecified: Secondary | ICD-10-CM | POA: Diagnosis not present

## 2020-08-04 DIAGNOSIS — I2510 Atherosclerotic heart disease of native coronary artery without angina pectoris: Secondary | ICD-10-CM | POA: Diagnosis present

## 2020-08-04 DIAGNOSIS — Z923 Personal history of irradiation: Secondary | ICD-10-CM

## 2020-08-04 DIAGNOSIS — Z6841 Body Mass Index (BMI) 40.0 and over, adult: Secondary | ICD-10-CM | POA: Diagnosis not present

## 2020-08-04 DIAGNOSIS — J9601 Acute respiratory failure with hypoxia: Secondary | ICD-10-CM | POA: Diagnosis present

## 2020-08-04 DIAGNOSIS — E1142 Type 2 diabetes mellitus with diabetic polyneuropathy: Secondary | ICD-10-CM | POA: Diagnosis present

## 2020-08-04 DIAGNOSIS — I358 Other nonrheumatic aortic valve disorders: Secondary | ICD-10-CM | POA: Diagnosis present

## 2020-08-04 DIAGNOSIS — N184 Chronic kidney disease, stage 4 (severe): Secondary | ICD-10-CM | POA: Diagnosis present

## 2020-08-04 DIAGNOSIS — I480 Paroxysmal atrial fibrillation: Secondary | ICD-10-CM | POA: Diagnosis not present

## 2020-08-04 DIAGNOSIS — I447 Left bundle-branch block, unspecified: Secondary | ICD-10-CM | POA: Diagnosis present

## 2020-08-04 DIAGNOSIS — R001 Bradycardia, unspecified: Secondary | ICD-10-CM | POA: Diagnosis present

## 2020-08-04 DIAGNOSIS — E1122 Type 2 diabetes mellitus with diabetic chronic kidney disease: Secondary | ICD-10-CM | POA: Diagnosis present

## 2020-08-04 DIAGNOSIS — Z9049 Acquired absence of other specified parts of digestive tract: Secondary | ICD-10-CM

## 2020-08-04 DIAGNOSIS — Z87891 Personal history of nicotine dependence: Secondary | ICD-10-CM

## 2020-08-04 DIAGNOSIS — I5033 Acute on chronic diastolic (congestive) heart failure: Secondary | ICD-10-CM

## 2020-08-04 DIAGNOSIS — Z7951 Long term (current) use of inhaled steroids: Secondary | ICD-10-CM

## 2020-08-04 DIAGNOSIS — E860 Dehydration: Secondary | ICD-10-CM | POA: Diagnosis present

## 2020-08-04 DIAGNOSIS — J9621 Acute and chronic respiratory failure with hypoxia: Secondary | ICD-10-CM | POA: Diagnosis present

## 2020-08-04 DIAGNOSIS — I5043 Acute on chronic combined systolic (congestive) and diastolic (congestive) heart failure: Secondary | ICD-10-CM | POA: Diagnosis present

## 2020-08-04 DIAGNOSIS — Z8572 Personal history of non-Hodgkin lymphomas: Secondary | ICD-10-CM

## 2020-08-04 DIAGNOSIS — I4819 Other persistent atrial fibrillation: Secondary | ICD-10-CM | POA: Diagnosis present

## 2020-08-04 DIAGNOSIS — I10 Essential (primary) hypertension: Secondary | ICD-10-CM

## 2020-08-04 DIAGNOSIS — J449 Chronic obstructive pulmonary disease, unspecified: Secondary | ICD-10-CM

## 2020-08-04 DIAGNOSIS — E1140 Type 2 diabetes mellitus with diabetic neuropathy, unspecified: Secondary | ICD-10-CM | POA: Diagnosis present

## 2020-08-04 DIAGNOSIS — J9622 Acute and chronic respiratory failure with hypercapnia: Secondary | ICD-10-CM | POA: Diagnosis present

## 2020-08-04 DIAGNOSIS — E785 Hyperlipidemia, unspecified: Secondary | ICD-10-CM | POA: Diagnosis present

## 2020-08-04 DIAGNOSIS — G4733 Obstructive sleep apnea (adult) (pediatric): Secondary | ICD-10-CM | POA: Diagnosis present

## 2020-08-04 DIAGNOSIS — Z23 Encounter for immunization: Secondary | ICD-10-CM

## 2020-08-04 DIAGNOSIS — R0902 Hypoxemia: Secondary | ICD-10-CM | POA: Diagnosis present

## 2020-08-04 DIAGNOSIS — E876 Hypokalemia: Secondary | ICD-10-CM | POA: Diagnosis present

## 2020-08-04 DIAGNOSIS — Z9221 Personal history of antineoplastic chemotherapy: Secondary | ICD-10-CM | POA: Diagnosis not present

## 2020-08-04 DIAGNOSIS — Z794 Long term (current) use of insulin: Secondary | ICD-10-CM

## 2020-08-04 DIAGNOSIS — Z8616 Personal history of COVID-19: Secondary | ICD-10-CM | POA: Diagnosis not present

## 2020-08-04 DIAGNOSIS — Z96653 Presence of artificial knee joint, bilateral: Secondary | ICD-10-CM | POA: Diagnosis present

## 2020-08-04 DIAGNOSIS — D696 Thrombocytopenia, unspecified: Secondary | ICD-10-CM | POA: Diagnosis present

## 2020-08-04 DIAGNOSIS — I509 Heart failure, unspecified: Secondary | ICD-10-CM

## 2020-08-04 DIAGNOSIS — I252 Old myocardial infarction: Secondary | ICD-10-CM

## 2020-08-04 DIAGNOSIS — Z79890 Hormone replacement therapy (postmenopausal): Secondary | ICD-10-CM

## 2020-08-04 DIAGNOSIS — J189 Pneumonia, unspecified organism: Secondary | ICD-10-CM

## 2020-08-04 HISTORY — DX: COVID-19: U07.1

## 2020-08-04 HISTORY — DX: Unspecified atrial fibrillation: I48.91

## 2020-08-04 HISTORY — DX: Pneumonia due to coronavirus disease 2019: J12.82

## 2020-08-04 LAB — CBC WITH DIFFERENTIAL/PLATELET
Abs Immature Granulocytes: 0.02 10*3/uL (ref 0.00–0.07)
Basophils Absolute: 0.1 10*3/uL (ref 0.0–0.1)
Basophils Relative: 1 %
Eosinophils Absolute: 0.1 10*3/uL (ref 0.0–0.5)
Eosinophils Relative: 2 %
HCT: 48.3 % (ref 39.0–52.0)
Hemoglobin: 15 g/dL (ref 13.0–17.0)
Immature Granulocytes: 0 %
Lymphocytes Relative: 19 %
Lymphs Abs: 1.1 10*3/uL (ref 0.7–4.0)
MCH: 29.5 pg (ref 26.0–34.0)
MCHC: 31.1 g/dL (ref 30.0–36.0)
MCV: 95.1 fL (ref 80.0–100.0)
Monocytes Absolute: 0.4 10*3/uL (ref 0.1–1.0)
Monocytes Relative: 7 %
Neutro Abs: 4.2 10*3/uL (ref 1.7–7.7)
Neutrophils Relative %: 71 %
Platelets: 122 10*3/uL — ABNORMAL LOW (ref 150–400)
RBC: 5.08 MIL/uL (ref 4.22–5.81)
RDW: 16.6 % — ABNORMAL HIGH (ref 11.5–15.5)
WBC: 5.9 10*3/uL (ref 4.0–10.5)
nRBC: 0 % (ref 0.0–0.2)

## 2020-08-04 LAB — COMPREHENSIVE METABOLIC PANEL
ALT: 11 U/L (ref 0–44)
AST: 13 U/L — ABNORMAL LOW (ref 15–41)
Albumin: 4 g/dL (ref 3.5–5.0)
Alkaline Phosphatase: 77 U/L (ref 38–126)
Anion gap: 7 (ref 5–15)
BUN: 35 mg/dL — ABNORMAL HIGH (ref 8–23)
CO2: 28 mmol/L (ref 22–32)
Calcium: 8.6 mg/dL — ABNORMAL LOW (ref 8.9–10.3)
Chloride: 102 mmol/L (ref 98–111)
Creatinine, Ser: 2.6 mg/dL — ABNORMAL HIGH (ref 0.61–1.24)
GFR, Estimated: 25 mL/min — ABNORMAL LOW (ref >60)
Glucose, Bld: 63 mg/dL — ABNORMAL LOW (ref 70–99)
Potassium: 4.3 mmol/L (ref 3.5–5.1)
Sodium: 137 mmol/L (ref 135–145)
Total Bilirubin: 1.1 mg/dL (ref 0.3–1.2)
Total Protein: 7.1 g/dL (ref 6.5–8.1)

## 2020-08-04 LAB — TROPONIN I (HIGH SENSITIVITY)
Troponin I (High Sensitivity): 17 ng/L (ref <18)
Troponin I (High Sensitivity): 17 ng/L (ref <18)

## 2020-08-04 LAB — GLUCOSE, CAPILLARY: Glucose-Capillary: 124 mg/dL — ABNORMAL HIGH (ref 70–99)

## 2020-08-04 LAB — BRAIN NATRIURETIC PEPTIDE: B Natriuretic Peptide: 231 pg/mL — ABNORMAL HIGH (ref 0.0–100.0)

## 2020-08-04 LAB — PROCALCITONIN: Procalcitonin: <0.1 ng/mL

## 2020-08-04 MED ORDER — INSULIN DETEMIR 100 UNIT/ML ~~LOC~~ SOLN
20.0000 [IU] | Freq: Every day | SUBCUTANEOUS | Status: DC
  Administered 2020-08-04 – 2020-08-09 (×5): 20 [IU] via SUBCUTANEOUS
  Filled 2020-08-04 (×8): qty 0.2

## 2020-08-04 MED ORDER — SENNOSIDES-DOCUSATE SODIUM 8.6-50 MG PO TABS: 1.0000 | ORAL_TABLET | Freq: Every day | ORAL | Status: DC | PRN

## 2020-08-04 MED ORDER — IPRATROPIUM-ALBUTEROL 0.5-2.5 (3) MG/3ML IN SOLN
3.0000 mL | RESPIRATORY_TRACT | Status: DC | PRN
  Filled 2020-08-04: qty 3

## 2020-08-04 MED ORDER — FUROSEMIDE 10 MG/ML IJ SOLN
40.0000 mg | Freq: Once | INTRAMUSCULAR | Status: AC
  Administered 2020-08-04: 40 mg via INTRAVENOUS
  Filled 2020-08-04: qty 4

## 2020-08-04 MED ORDER — APIXABAN 5 MG PO TABS
5.0000 mg | ORAL_TABLET | Freq: Two times a day (BID) | ORAL | Status: DC
  Administered 2020-08-04 – 2020-08-10 (×12): 5 mg via ORAL
  Filled 2020-08-04 (×12): qty 1

## 2020-08-04 MED ORDER — INSULIN ASPART 100 UNIT/ML ~~LOC~~ SOLN
0.0000 [IU] | Freq: Three times a day (TID) | SUBCUTANEOUS | Status: DC
  Administered 2020-08-05: 2 [IU] via SUBCUTANEOUS
  Administered 2020-08-05: 5 [IU] via SUBCUTANEOUS
  Administered 2020-08-07 – 2020-08-08 (×2): 2 [IU] via SUBCUTANEOUS
  Administered 2020-08-09: 3 [IU] via SUBCUTANEOUS
  Administered 2020-08-09 – 2020-08-10 (×3): 2 [IU] via SUBCUTANEOUS

## 2020-08-04 MED ORDER — AMLODIPINE BESYLATE 5 MG PO TABS
10.0000 mg | ORAL_TABLET | Freq: Every day | ORAL | Status: DC
  Administered 2020-08-05 – 2020-08-10 (×6): 10 mg via ORAL
  Filled 2020-08-04 (×6): qty 2

## 2020-08-04 MED ORDER — SIMVASTATIN 10 MG PO TABS
5.0000 mg | ORAL_TABLET | Freq: Every day | ORAL | Status: DC
  Administered 2020-08-05 – 2020-08-10 (×6): 5 mg via ORAL
  Filled 2020-08-04 (×6): qty 1

## 2020-08-04 MED ORDER — ONDANSETRON HCL 4 MG/2ML IJ SOLN: 4.0000 mg | Freq: Four times a day (QID) | INTRAMUSCULAR | Status: DC | PRN

## 2020-08-04 MED ORDER — SODIUM CHLORIDE 0.9% FLUSH: 3.0000 mL | INTRAVENOUS | Status: DC | PRN

## 2020-08-04 MED ORDER — LEVOCETIRIZINE DIHYDROCHLORIDE 5 MG PO TABS: 5.0000 mg | ORAL_TABLET | Freq: Every evening | ORAL | Status: DC

## 2020-08-04 MED ORDER — LEVOTHYROXINE SODIUM 100 MCG PO TABS
300.0000 ug | ORAL_TABLET | Freq: Every day | ORAL | Status: DC
  Administered 2020-08-05 – 2020-08-10 (×6): 300 ug via ORAL
  Filled 2020-08-04 (×6): qty 3

## 2020-08-04 MED ORDER — INSULIN ASPART 100 UNIT/ML ~~LOC~~ SOLN
0.0000 [IU] | Freq: Every day | SUBCUTANEOUS | Status: DC
  Administered 2020-08-05: 2 [IU] via SUBCUTANEOUS

## 2020-08-04 MED ORDER — SODIUM CHLORIDE 0.9 % IV SOLN
1.0000 g | Freq: Once | INTRAVENOUS | Status: AC
  Administered 2020-08-04: 1 g via INTRAVENOUS
  Filled 2020-08-04: qty 10

## 2020-08-04 MED ORDER — SODIUM CHLORIDE 0.9% FLUSH
3.0000 mL | Freq: Two times a day (BID) | INTRAVENOUS | Status: DC
  Administered 2020-08-05 – 2020-08-10 (×11): 3 mL via INTRAVENOUS

## 2020-08-04 MED ORDER — ISOSORBIDE MONONITRATE ER 60 MG PO TB24
30.0000 mg | ORAL_TABLET | Freq: Every day | ORAL | Status: DC
  Administered 2020-08-05 – 2020-08-10 (×6): 30 mg via ORAL
  Filled 2020-08-04 (×6): qty 1

## 2020-08-04 MED ORDER — SODIUM CHLORIDE 0.9 % IV SOLN
500.0000 mg | Freq: Once | INTRAVENOUS | Status: AC
  Administered 2020-08-04: 500 mg via INTRAVENOUS
  Filled 2020-08-04: qty 500

## 2020-08-04 MED ORDER — LORATADINE 10 MG PO TABS
10.0000 mg | ORAL_TABLET | Freq: Every day | ORAL | Status: DC
  Administered 2020-08-05 – 2020-08-09 (×5): 10 mg via ORAL
  Filled 2020-08-04 (×5): qty 1

## 2020-08-04 MED ORDER — ACETAMINOPHEN 325 MG PO TABS: 650.0000 mg | ORAL_TABLET | ORAL | Status: DC | PRN

## 2020-08-04 MED ORDER — SODIUM CHLORIDE 0.9 % IV SOLN: 250.0000 mL | INTRAVENOUS | Status: DC | PRN

## 2020-08-04 MED ORDER — BISOPROLOL FUMARATE 5 MG PO TABS
2.5000 mg | ORAL_TABLET | Freq: Every day | ORAL | Status: DC
  Administered 2020-08-05 – 2020-08-07 (×3): 2.5 mg via ORAL
  Filled 2020-08-04 (×3): qty 1

## 2020-08-04 MED ORDER — MOMETASONE FURO-FORMOTEROL FUM 200-5 MCG/ACT IN AERO
2.0000 | INHALATION_SPRAY | Freq: Two times a day (BID) | RESPIRATORY_TRACT | Status: DC
  Administered 2020-08-05 – 2020-08-10 (×11): 2 via RESPIRATORY_TRACT
  Filled 2020-08-04: qty 8.8

## 2020-08-04 MED ORDER — NITROGLYCERIN 0.4 MG SL SUBL: 0.4000 mg | SUBLINGUAL_TABLET | SUBLINGUAL | Status: DC | PRN

## 2020-08-04 MED ORDER — FUROSEMIDE 10 MG/ML IJ SOLN
60.0000 mg | Freq: Two times a day (BID) | INTRAMUSCULAR | Status: DC
  Administered 2020-08-05 – 2020-08-06 (×3): 60 mg via INTRAVENOUS
  Filled 2020-08-04 (×3): qty 6

## 2020-08-04 NOTE — ED Provider Notes (Signed)
[facility] EMERGENCY DEPARTMENT Provider Note   CSN: [id number] Arrival date & time: 08/04/20  1230     History Chief Complaint  Patient presents with  . Shortness of Breath    [name] [name] is a 75 y.o. male.  The history is provided by the patient. No language interpreter was used.  Shortness of Breath Severity:  Severe Onset quality:  Gradual Duration:  2 days Timing:  Constant Progression:  Worsening Chronicity:  New Relieved by:  Nothing Worsened by:  Activity Ineffective treatments:  None tried Associated symptoms: no chest pain    Pt diagnosed with Covid 4 weeks ago.  Pt also has ahistory of chf.  Pt reports his MD is stopping his lasix     Past Medical History:  Diagnosis Date  . CAD (coronary artery disease)    DES to LAD June 2018  . CKD (chronic kidney disease) stage 3, GFR 30-59 ml/min (HCC)   . COVID   . Essential hypertension   . History of pneumonia   . Hyperlipidemia   . Hypothyroidism   . LBBB (left bundle branch block)   . Morbid obesity ([place])   . Non Hodgkin's lymphoma ([place])    Status post XRT and chemotherapy  . NSTEMI (non-ST elevated myocardial infarction) [facility])    June 2018  . Peripheral neuropathy   . Sleep apnea   . Type 2 diabetes mellitus [facility])     Patient Active Problem List   Diagnosis Date Noted  . Acute exacerbation of CHF (congestive heart failure) ([place]) 07/19/2020  . AF (paroxysmal atrial fibrillation) ([place]) 07/19/2020  . COVID-19   . Acute on chronic respiratory failure with hypoxia ([place]) 06/25/2020  . Aspiration pneumonia ([place]) 06/24/2020  . Pneumonia due to COVID-19 virus 06/24/2020  . Confusion   . Small bowel obstruction ([place]) 01/29/2020  . SBO (small bowel obstruction) ([place]) 01/28/2020  . Primary osteoarthritis of left knee 05/24/2018  . Primary localized osteoarthritis of left knee 05/19/2018  . Acute on chronic systolic and diastolic heart failure, NYHA class 1 ([place]) 04/01/2017  . CAD (coronary artery disease)  04/01/2017  . SOB (shortness of breath) 03/21/2017  . HTN (hypertension) 03/21/2017  . NSTEMI (non-ST elevated myocardial infarction) ([place]) 01/19/2017  . NSTEMI, initial episode of care ([place]) 01/19/2017  . Sepsis ([place]) 07/24/2016  . Elevated troponin I level 07/24/2016  . Fever 07/24/2016  . Lactic acidosis 07/24/2016  . Adjustment insomnia 05/18/2016  . Erectile dysfunction 12/19/2015  . Sleep apnea 09/30/2015  . Osteoarthritis 09/30/2015  . Hypothyroidism 09/30/2015  . Hypogonadism in male 09/30/2015  . Diabetic neuropathy ([place]) 09/30/2015  . Chronic pain of left knee 09/30/2015  . Benign essential hypertension 09/30/2015  . Acute on chronic kidney failure ([place]) 05/14/2015  . Diarrhea 05/14/2015  . Dehydration 05/14/2015  . Hypokalemia 05/14/2015  . 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 Pelvic mass in male   . Varices, esophageal ([place])   . Colonic mass   . Abnormal CT scan, pelvis   . Bladder mass   . Elevated liver enzymes   . Elevated LFTs   . Abdominal pain 07/23/2014  . Chronic kidney disease Stage IV 07/23/2014  . Morbid obesity ([place]) 07/23/2014  . Type 2 diabetes mellitus with stage 4 chronic kidney disease ([place]) 07/23/2014  . Hypertension 07/23/2014  . S/P total knee replacement, 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    Past Surgical History:  Procedure Laterality Date  . CHOLECYSTECTOMY  1992  . COLONOSCOPY N/A 09/03/2014   SLF:six  colon polyps removed/small internal hemorrhoids  . CORONARY STENT INTERVENTION N/A 01/21/2017   Procedure: Coronary Stent Intervention;  Surgeon: Nelva Bush, MD;  Location: [place] [facility];  Service: Cardiovascular;  Laterality: N/A;  . ESOPHAGOGASTRODUODENOSCOPY N/A 09/03/2014   SLF: mild gastritis/few gastric polyps  . LEFT HEART CATH AND CORONARY ANGIOGRAPHY N/A 01/20/2017   Procedure: Left Heart Cath and Coronary Angiography;  Surgeon: Jettie Booze, MD;  Location: [place] [facility];  Service: Cardiovascular;  Laterality: N/A;  .  TOTAL KNEE ARTHROPLASTY  11/10/2011   Procedure: TOTAL KNEE ARTHROPLASTY;  Surgeon: Mauri Pole, MD;  Location: [facility];  Service: Orthopedics;  Laterality: Right;  . TOTAL KNEE ARTHROPLASTY Left 05/24/2018   Procedure: LEFT TOTAL KNEE ARTHROPLASTY;  Surgeon: Melrose Nakayama, MD;  Location: [place];  Service: Orthopedics;  Laterality: Left;       Family History  Problem Relation Age of Onset  . Cancer Mother        breast and lung  . Cancer Father        bladder  . Cancer Maternal Uncle        prostate  . Cancer Paternal Uncle        esophagus  . Colon cancer Neg Hx     Social History   Tobacco Use  . Smoking status: Former Smoker    Packs/day: 0.25    Years: 30.00    Pack years: 7.50    Types: Cigarettes    Quit date: 08/04/2007    Years since quitting: 13.0  . Smokeless tobacco: Never Used  Vaping Use  . Vaping Use: Never used  Substance Use Topics  . Alcohol use: No    Alcohol/week: 0.0 standard drinks  . Drug use: No    Home Medications Prior to Admission medications   Medication Sig Start Date End Date Taking? Authorizing Provider  acetaminophen (TYLENOL) 325 MG tablet Take 2 tablets (650 mg total) by mouth every 6 (six) hours as needed for mild pain (or Fever >/= 101). 02/01/20   Roxan Hockey, MD  albuterol (VENTOLIN HFA) 108 (90 Base) MCG/ACT inhaler Inhale 1-2 puffs into the lungs every 6 (six) hours as needed for wheezing or shortness of breath. 01/10/19   Long, Wonda Olds, MD  amLODipine (NORVASC) 10 MG tablet Take 10 mg by mouth daily.    [provider]  apixaban (ELIQUIS) 2.5 MG TABS tablet Take 1 tablet (2.5 mg total) by mouth 2 (two) times daily. 07/31/20   Verta Ellen., NP  bisoprolol (ZEBETA) 5 MG tablet Take 2.5 mg by mouth daily.    [provider]  fluticasone (FLONASE) 50 MCG/ACT nasal spray Place 2 sprays into both nostrils daily for 14 days. Patient taking differently: Place 2 sprays into both nostrils as needed.  01/10/19  06/24/20  Long, Wonda Olds, MD  furosemide (LASIX) 40 MG tablet Take 40 mg by mouth daily.    [provider]  hydrALAZINE (APRESOLINE) 25 MG tablet Take 1 tablet (25 mg total) by mouth 2 (two) times daily. Patient taking differently: Take 25 mg by mouth daily.  04/04/20 07/03/20  Satira Sark, MD  insulin aspart (NOVOLOG) 100 UNIT/ML FlexPen See admin instructions. 04/12/20   [provider]  insulin degludec (TRESIBA FLEXTOUCH) 100 UNIT/ML SOPN FlexTouch Pen Inject 0.5 mLs (50 Units total) into the skin daily at 10 pm. Patient taking differently: Inject 60 Units into the skin daily at 10 pm. 07/28/16   Kathie Dike, MD  ipratropium-albuterol (DUONEB) 0.5-2.5 (3) MG/3ML SOLN Inhale 3 mLs into the lungs every 4 (four) hours as needed.  05/22/20   [provider]  isosorbide mononitrate (IMDUR) 30 MG 24 hr tablet Take 1 tablet (30 mg total) by mouth daily. 04/02/17   Orvan Falconer, MD  levocetirizine (XYZAL) 5 MG tablet Take 5 mg by mouth every evening.  08/27/17   [provider]  levothyroxine (SYNTHROID) 100 MCG tablet Take 100 mcg by mouth daily. (takes with 2105mcg for a total of 353mcg) 01/08/20   [provider]  levothyroxine (SYNTHROID, LEVOTHROID) 200 MCG tablet Take 300 mcg by mouth daily before breakfast. (takes with 196mcg for a total of 339mcg)    [provider]  nitroGLYCERIN (NITROSTAT) 0.4 MG SL tablet Place 0.4 mg under the tongue every 5 (five) minutes as needed for chest pain.  12/27/17   [provider]  OXYGEN Inhale 2 L into the lungs daily as needed.     [provider]  senna-docusate (SENOKOT-S) 8.6-50 MG tablet Take 1 tablet by mouth daily as needed.  02/01/20   [provider]  simvastatin (ZOCOR) 5 MG tablet Take 5 mg by mouth daily. 09/14/16   [provider]  SYMBICORT 160-4.5 MCG/ACT inhaler Inhale 2 puffs into the lungs daily. 12/22/19   [provider]    Allergies    Patient  has no known allergies.  Review of Systems   Review of Systems  Respiratory: Positive for shortness of breath.   Cardiovascular: Negative for chest pain.  All other systems reviewed and are negative.   Physical Exam Updated Vital Signs BP 138/76   Pulse 62   Temp 97.6 F (36.4 C) (Oral)   Resp 14   Ht 6\' 1"  (1.854 m)   Wt (!) 138.3 kg   SpO2 98%   BMI 40.24 kg/m   Physical Exam Vitals reviewed.  HENT:     Head: Normocephalic.  Eyes:     Pupils: Pupils are equal, round, and reactive to light.  Cardiovascular:     Rate and Rhythm: Normal rate.  Pulmonary:     Effort: Pulmonary effort is normal.     Breath sounds: Decreased breath sounds and rhonchi present.  Musculoskeletal:     Cervical back: Normal range of motion.     Right lower leg: Edema present.     Left lower leg: Edema present.  Skin:    General: Skin is warm.  Neurological:     General: No focal deficit present.     Mental Status: He is alert.  Psychiatric:        Mood and Affect: Mood normal.     ED Results / Procedures / Treatments   Labs (all labs ordered are listed, but only abnormal results are displayed) Labs Reviewed  CBC WITH DIFFERENTIAL/PLATELET - Abnormal; Notable for the following components:      Result Value   RDW 16.6 (*)    Platelets 122 (*)    All other components within normal limits  COMPREHENSIVE METABOLIC PANEL - Abnormal; Notable for the following components:   Glucose, Bld 63 (*)    BUN 35 (*)    Creatinine, Ser 2.60 (*)    Calcium 8.6 (*)    AST 13 (*)    GFR, Estimated 25 (*)    All other components within normal limits  BRAIN NATRIURETIC PEPTIDE - Abnormal; Notable for the following components:   B Natriuretic Peptide 231.0 (*)    All other  components within normal limits  TROPONIN I (HIGH SENSITIVITY)  TROPONIN I (HIGH SENSITIVITY)    EKG None  Radiology DG Chest Port 1 View  Result Date: 08/04/2020 CLINICAL DATA:  Shortness of breath.  COVID-19 positive.  EXAM: PORTABLE CHEST 1 VIEW COMPARISON:  July 19, 2020. FINDINGS: Stable cardiomediastinal silhouette. No pneumothorax or pleural effusion is noted. Stable hazy opacities are noted throughout both lungs concerning for atypical pneumonia. Bony thorax is unremarkable. IMPRESSION: Stable hazy opacities are noted throughout both lungs concerning for atypical pneumonia. Electronically Signed   By: Marijo Conception M.D.   On: 08/04/2020 13:43    Procedures Procedures (including critical care time)  Medications Ordered in ED Medications  furosemide (LASIX) injection 40 mg (has no administration in time range)  cefTRIAXone (ROCEPHIN) 1 g in sodium chloride 0.9 % 100 mL IVPB (has no administration in time range)  azithromycin (ZITHROMAX) 500 mg in sodium chloride 0.9 % 250 mL IVPB (has no administration in time range)    ED Course  I have reviewed the triage vital signs and the nursing notes.  Pertinent labs & imaging results that were available during my care of the patient were reviewed by me and considered in my medical decision making (see chart for details).    MDM Rules/Calculators/A&P                         MDM:  Pt on 02 4 liters, Chest xray shows possible atypical pneumonia.  Pt given zithromax and Rocephin lasix 40 mg.   Hospitalist consulted for admission  Final Clinical Impression(s) / ED Diagnoses Final diagnoses:  Community acquired pneumonia, unspecified laterality    Rx / DC Orders ED Discharge Orders    None       Sidney Ace 08/04/20 1717    Milton Ferguson, MD 08/08/20 1035

## 2020-08-04 NOTE — ED Triage Notes (Signed)
Patient brought in via EMS from home. Alert and oriented. Airway patent. Patient c/o shortness of breath with swelling in lower lungs. Patient tested positive x4 weeks ago for Covid and was admitted, patient left AMA. Patient's shortness of breath progressively getting worse. Per paramedic patient's O2 sat 80s on room air. Patient's O2 sat 98% on 4 liters via [place]. Patient does have CHF +4 pitting edema noted. Patient also c/o wound to left hand in which he states is not healing. Patient diabetic. Blood sugar 82 per paramedic.

## 2020-08-04 NOTE — H&P (Signed)
History and Physical   [name] [name] IZT:[id number] DOB: November 21, 1945 DOA: 08/04/2020  Referring MD/NP/PA: Alyse Low, PA, EDP PCP: [name]  Patient coming from: Home  Chief Complaint: Shortness of breath  HPI: [name] [name] is a 75 y.o. male with a history of CAD s/p PCI 2018, LBBB, chronic HFpEF, stage IV CKD, HTN, HLD, T2DM with neuropathy, OSA, obesity (BMI 40), marginal zone lymphoma s/p chemoradiation, and recent admission for covid-19 pneumonia, on home oxygen 3L who presented to the ED with worsening dyspnea, leg swelling. He reports slight increase in cough which is mild, intermittent, nonproductive, denies fever, chills, sick contacts. Dyspnea has been constant, worsening, moderate-severe. Has never really felt his breathing improve since covid diagnosis. +Orthopnea though this is chronic, maybe a bit worse lately. No chest pain, palpitations.   He reports stopping lasix yesterday on the advice of his cardiologist, though he believes he was supposed to transition from this to eliquis, so this remains unclear.  ED Course: Hypoxic with bilateral CXR infiltrates, afebrile. Leg swelling noted. Lasix and antibiotics given, hospitalists consulted.  Review of Systems: Denies fever, chills, weight loss, changes in vision or hearing, headache, sore throat, chest pain, palpitations, abdominal pain, nausea, vomiting, changes in bowel habits, blood in stool, change in bladder habits, myalgias, arthralgias, rash and per HPI. All others reviewed and are negative.   Past Medical History:  Diagnosis Date  . CAD (coronary artery disease)    DES to LAD June 2018  . CKD (chronic kidney disease) stage 3, GFR 30-59 ml/min (HCC)   . COVID   . Essential hypertension   . History of pneumonia   . Hyperlipidemia   . Hypothyroidism   . LBBB (left bundle branch block)   . Morbid obesity ([place])   . Non Hodgkin's lymphoma ([place])    Status post XRT and chemotherapy  . NSTEMI  (non-ST elevated myocardial infarction) [facility])    June 2018  . Peripheral neuropathy   . Sleep apnea   . Type 2 diabetes mellitus ([place])    Past Surgical History:  Procedure Laterality Date  . CHOLECYSTECTOMY  1992  . COLONOSCOPY N/A 09/03/2014   SLF:six colon polyps removed/small internal hemorrhoids  . CORONARY STENT INTERVENTION N/A 01/21/2017   Procedure: Coronary Stent Intervention;  Surgeon: Nelva Bush, MD;  Location: [place] [facility];  Service: Cardiovascular;  Laterality: N/A;  . ESOPHAGOGASTRODUODENOSCOPY N/A 09/03/2014   SLF: mild gastritis/few gastric polyps  . LEFT HEART CATH AND CORONARY ANGIOGRAPHY N/A 01/20/2017   Procedure: Left Heart Cath and Coronary Angiography;  Surgeon: Jettie Booze, MD;  Location: [place] [facility];  Service: Cardiovascular;  Laterality: N/A;  . TOTAL KNEE ARTHROPLASTY  11/10/2011   Procedure: TOTAL KNEE ARTHROPLASTY;  Surgeon: Mauri Pole, MD;  Location: [facility];  Service: Orthopedics;  Laterality: Right;  . TOTAL KNEE ARTHROPLASTY Left 05/24/2018   Procedure: LEFT TOTAL KNEE ARTHROPLASTY;  Surgeon: Melrose Nakayama, MD;  Location: [place];  Service: Orthopedics;  Laterality: Left;   - No longer smoker or drinking alcohol.  reports that he quit smoking about 13 years ago. His smoking use included cigarettes. He has a 7.50 pack-year smoking history. He has never used smokeless tobacco. He reports that he does not drink alcohol and does not use drugs. No Known Allergies Family History  Problem Relation Age of Onset  . Cancer Mother        breast and lung  . Cancer Father  bladder  . Cancer Maternal Uncle        prostate  . Cancer Paternal Uncle        esophagus  . Colon cancer Neg Hx    - Family history otherwise reviewed and not pertinent.  Prior to Admission medications   Medication Sig Start Date End Date Taking? Authorizing Provider  acetaminophen (TYLENOL) 325 MG tablet Take 2 tablets (650 mg total) by mouth every 6 (six)  hours as needed for mild pain (or Fever >/= 101). 02/01/20   Roxan Hockey, MD  albuterol (VENTOLIN HFA) 108 (90 Base) MCG/ACT inhaler Inhale 1-2 puffs into the lungs every 6 (six) hours as needed for wheezing or shortness of breath. 01/10/19   Long, Wonda Olds, MD  amLODipine (NORVASC) 10 MG tablet Take 10 mg by mouth daily.    [provider]  apixaban (ELIQUIS) 2.5 MG TABS tablet Take 1 tablet (2.5 mg total) by mouth 2 (two) times daily. 07/31/20   Verta Ellen., NP  bisoprolol (ZEBETA) 5 MG tablet Take 2.5 mg by mouth daily.    [provider]  fluticasone (FLONASE) 50 MCG/ACT nasal spray Place 2 sprays into both nostrils daily for 14 days. Patient taking differently: Place 2 sprays into both nostrils as needed.  01/10/19 06/24/20  Long, Wonda Olds, MD  furosemide (LASIX) 40 MG tablet Take 40 mg by mouth daily.    [provider]  hydrALAZINE (APRESOLINE) 25 MG tablet Take 1 tablet (25 mg total) by mouth 2 (two) times daily. Patient taking differently: Take 25 mg by mouth daily.  04/04/20 07/03/20  Satira Sark, MD  insulin aspart (NOVOLOG) 100 UNIT/ML FlexPen See admin instructions. 04/12/20   [provider]  insulin degludec (TRESIBA FLEXTOUCH) 100 UNIT/ML SOPN FlexTouch Pen Inject 0.5 mLs (50 Units total) into the skin daily at 10 pm. Patient taking differently: Inject 60 Units into the skin daily at 10 pm. 07/28/16   Kathie Dike, MD  ipratropium-albuterol (DUONEB) 0.5-2.5 (3) MG/3ML SOLN Inhale 3 mLs into the lungs every 4 (four) hours as needed.  05/22/20   [provider]  isosorbide mononitrate (IMDUR) 30 MG 24 hr tablet Take 1 tablet (30 mg total) by mouth daily. 04/02/17   Orvan Falconer, MD  levocetirizine (XYZAL) 5 MG tablet Take 5 mg by mouth every evening.  08/27/17   [provider]  levothyroxine (SYNTHROID) 100 MCG tablet Take 100 mcg by mouth daily. (takes with 231mcg for a total of 365mcg) 01/08/20   [provider]   levothyroxine (SYNTHROID, LEVOTHROID) 200 MCG tablet Take 300 mcg by mouth daily before breakfast. (takes with 139mcg for a total of 37mcg)    [provider]  nitroGLYCERIN (NITROSTAT) 0.4 MG SL tablet Place 0.4 mg under the tongue every 5 (five) minutes as needed for chest pain.  12/27/17   [provider]  OXYGEN Inhale 2 L into the lungs daily as needed.     [provider]  senna-docusate (SENOKOT-S) 8.6-50 MG tablet Take 1 tablet by mouth daily as needed.  02/01/20   [provider]  simvastatin (ZOCOR) 5 MG tablet Take 5 mg by mouth daily. 09/14/16   [provider]  SYMBICORT 160-4.5 MCG/ACT inhaler Inhale 2 puffs into the lungs daily. 12/22/19   [provider]    Physical Exam: Vitals:   08/04/20 1400 08/04/20 1430 08/04/20 1645 08/04/20 1700  BP: 129/71 138/76 (!) 152/90 125/73  Pulse: (!) 42 62 73 64  Resp:  17 14 16 14   Temp:      TempSrc:      SpO2: 98% 98% 95% 95%  Weight:      Height:       Constitutional: Chronically ill-appearing male in no distress, calm demeanor Eyes: Lids and conjunctivae normal, PERRL ENMT: Mucous membranes are moist. Posterior pharynx clear of any exudate or lesions. Fair dentition.  Neck: normal, supple, no masses, no thyromegaly Respiratory: Non-labored breathing supplemental oxygen without accessory muscle use. Upper airway wheezing slightly, mild bilateral crackles at bases bilaterally Cardiovascular: Regular rate and rhythm, no murmurs, rubs, or gallops. No carotid bruits. UTD JVD. 2+ pitting LE edema. Palpable pedal pulses. Abdomen: Normoactive bowel sounds. No tenderness, non-distended, and no masses palpated. No hepatosplenomegaly. GU: No indwelling catheter Musculoskeletal: No clubbing / cyanosis. No joint deformity upper and lower extremities. Good ROM, no contractures. Normal muscle tone.  Skin: Warm, dry. Eschar on left hand without cellulitis. Neurologic: CN II-XII grossly intact.  Speech normal. No focal deficits in motor strength or sensation in all extremities.  Psychiatric: Alert and oriented x3. Euthymic. Calm.  Labs on Admission: I have personally reviewed following labs and imaging studies  CBC: Recent Labs  Lab 08/04/20 1451  WBC 5.9  NEUTROABS 4.2  HGB 15.0  HCT 48.3  MCV 95.1  PLT 161*   Basic Metabolic Panel: Recent Labs  Lab 08/04/20 1451  NA 137  K 4.3  CL 102  CO2 28  GLUCOSE 63*  BUN 35*  CREATININE 2.60*  CALCIUM 8.6*   GFR: Estimated Creatinine Clearance: 37 mL/min (A) (by C-G formula based on SCr of 2.6 mg/dL (H)). Liver Function Tests: Recent Labs  Lab 08/04/20 1451  AST 13*  ALT 11  ALKPHOS 77  BILITOT 1.1  PROT 7.1  ALBUMIN 4.0   No results for input(s): LIPASE, AMYLASE in the last 168 hours. No results for input(s): AMMONIA in the last 168 hours. Coagulation Profile: No results for input(s): INR, PROTIME in the last 168 hours. Cardiac Enzymes: No results for input(s): CKTOTAL, CKMB, CKMBINDEX, TROPONINI in the last 168 hours. BNP (last 3 results) No results for input(s): PROBNP in the last 8760 hours. HbA1C: No results for input(s): HGBA1C in the last 72 hours. CBG: No results for input(s): GLUCAP in the last 168 hours. Lipid Profile: No results for input(s): CHOL, HDL, LDLCALC, TRIG, CHOLHDL, LDLDIRECT in the last 72 hours. Thyroid Function Tests: No results for input(s): TSH, T4TOTAL, FREET4, T3FREE, THYROIDAB in the last 72 hours. Anemia Panel: No results for input(s): VITAMINB12, FOLATE, FERRITIN, TIBC, IRON, RETICCTPCT in the last 72 hours. Urine analysis:    Component Value Date/Time   COLORURINE STRAW (A) 07/19/2020 2255   APPEARANCEUR CLEAR 07/19/2020 2255   LABSPEC 1.005 07/19/2020 2255   PHURINE 7.0 07/19/2020 2255   GLUCOSEU NEGATIVE 07/19/2020 2255   HGBUR NEGATIVE 07/19/2020 2255   BILIRUBINUR NEGATIVE 07/19/2020 2255   KETONESUR NEGATIVE 07/19/2020 2255   PROTEINUR NEGATIVE 07/19/2020  2255   UROBILINOGEN 4.0 (H) 07/23/2014 1356   NITRITE NEGATIVE 07/19/2020 2255   LEUKOCYTESUR NEGATIVE 07/19/2020 2255    No results found for this or any previous visit (from the past 240 hour(s)).   Radiological Exams on Admission: DG Chest Port 1 View  Result Date: 08/04/2020 CLINICAL DATA:  Shortness of breath.  COVID-19 positive. EXAM: PORTABLE CHEST 1 VIEW COMPARISON:  July 19, 2020. FINDINGS: Stable cardiomediastinal silhouette. No pneumothorax or pleural effusion is noted. Stable hazy opacities are noted throughout both lungs concerning  for atypical pneumonia. Bony thorax is unremarkable. IMPRESSION: Stable hazy opacities are noted throughout both lungs concerning for atypical pneumonia. Electronically Signed   By: Marijo Conception M.D.   On: 08/04/2020 13:43   EKG: Independently reviewed. Fine AFib controlled rate, QRS prolongation with leftward axis.  Assessment/Plan Active Problems:   Chronic kidney disease Stage IV   Morbid obesity (HCC)   Type 2 diabetes mellitus with stage 4 chronic kidney disease (HCC)   Hypertension   Sleep apnea   Hypothyroidism   Diabetic neuropathy (HCC)   CAD (coronary artery disease)   Acute on chronic respiratory failure with hypoxia (HCC)   Acute exacerbation of CHF (congestive heart failure) (HCC)   AF (paroxysmal atrial fibrillation) (HCC)   Acute respiratory failure with hypoxia (HCC)   Acute on chronic hypoxic respiratory failure: Suspected to be multifactorial due to recovering covid-19 pneumonia, acute CHF, possible bacterial pneumonia.  - Treat conditions as below.  - Continue supplemental oxygen. Respiratory effort is normal and pt subjectively improving, no indication for SDU/ICU level of care at time of admission.   Acute on chronic HFpEF, atrial fibrillation: Peripherally overloaded though albumin wnl and RV had reportedly normal function 07/20/2020, LVEF 50-55%, G2DD.  BNP 231 is higher than previous values. - Given lasix 40mg   IV x1 in ED with modest diuresis thus far, will continue this at 60mg  IV BID.  - Monitor I/O, daily weights.  - Continue eliquis, bisoprolol. Note age is less than 93 and weight is >60kg, so will change back to 5mg  po BID dosing of eliquis.  Concern for atypical pneumonia:  - Given CTX, azithromycin in ED. Afebrile with no new or focal infiltrates. Check PCT, will not plan to continue abx at this time.   History of covid-19 pneumonia: No longer requires isolation.  - If measures above do not improve respiratory status, could consider steroids.   IDT2DM:  - Pt reports taking 56 units long acting insulin nightly, no short-acting during day. Glucose low at 63 in ED. Also recent HbA1c was 5.8% indicating too tight of control. Will order 20 units levemir and AC/HS coverage with plans to titrate from there.     CAD hx PCI: No chest pain, stable IVCD, AFib on ECG. High sensitivity troponin negative x2.  - Continue BB, imdur, prn NTG, anticoagulation, statin  Stage IV CKD: SCr near baseline.  - Monitor with diuresis.  - Avoid nephrotoxins  OSA:  - CPAP qHS  Hypothyroidism:  - Continue synthroid 355mcg. Last TSH was 2.2.   Morbid obesity: Body mass index is 40.24 kg/m.   DVT prophylaxis: Eliquis  Code Status: Full  Family Communication: At bedside in ED Disposition Plan: Anticipate return home after DC. Note pt frequently leaves AMA. Consults called: None  Admission status: Inpatient    Patrecia Pour, MD [facility] [email or web address] 08/04/2020, 6:11 PM

## 2020-08-05 DIAGNOSIS — J9601 Acute respiratory failure with hypoxia: Secondary | ICD-10-CM | POA: Diagnosis not present

## 2020-08-05 DIAGNOSIS — I480 Paroxysmal atrial fibrillation: Secondary | ICD-10-CM | POA: Diagnosis not present

## 2020-08-05 DIAGNOSIS — I5033 Acute on chronic diastolic (congestive) heart failure: Secondary | ICD-10-CM

## 2020-08-05 DIAGNOSIS — N184 Chronic kidney disease, stage 4 (severe): Secondary | ICD-10-CM | POA: Diagnosis not present

## 2020-08-05 LAB — GLUCOSE, CAPILLARY
Glucose-Capillary: 123 mg/dL — ABNORMAL HIGH (ref 70–99)
Glucose-Capillary: 215 mg/dL — ABNORMAL HIGH (ref 70–99)
Glucose-Capillary: 150 mg/dL — ABNORMAL HIGH (ref 70–99)
Glucose-Capillary: 235 mg/dL — ABNORMAL HIGH (ref 70–99)

## 2020-08-05 LAB — BASIC METABOLIC PANEL
Anion gap: 11 (ref 5–15)
BUN: 35 mg/dL — ABNORMAL HIGH (ref 8–23)
CO2: 27 mmol/L (ref 22–32)
Calcium: 8.6 mg/dL — ABNORMAL LOW (ref 8.9–10.3)
Chloride: 103 mmol/L (ref 98–111)
Creatinine, Ser: 2.46 mg/dL — ABNORMAL HIGH (ref 0.61–1.24)
GFR, Estimated: 27 mL/min — ABNORMAL LOW (ref >60)
Glucose, Bld: 104 mg/dL — ABNORMAL HIGH (ref 70–99)
Potassium: 4.2 mmol/L (ref 3.5–5.1)
Sodium: 141 mmol/L (ref 135–145)

## 2020-08-05 LAB — MAGNESIUM: Magnesium: 2.2 mg/dL (ref 1.7–2.4)

## 2020-08-05 LAB — SARS CORONAVIRUS 2 (TAT 6-24 HRS): SARS Coronavirus 2: NEGATIVE

## 2020-08-05 MED ORDER — METHYLPREDNISOLONE SODIUM SUCC 40 MG IJ SOLR
40.0000 mg | Freq: Two times a day (BID) | INTRAMUSCULAR | Status: DC
  Filled 2020-08-05: qty 1

## 2020-08-05 MED ORDER — METHYLPREDNISOLONE SODIUM SUCC 125 MG IJ SOLR
125.0000 mg | Freq: Once | INTRAMUSCULAR | Status: AC
  Administered 2020-08-05: 125 mg via INTRAVENOUS
  Filled 2020-08-05: qty 2

## 2020-08-05 MED ORDER — FUROSEMIDE 10 MG/ML IJ SOLN
20.0000 mg | Freq: Once | INTRAMUSCULAR | Status: AC
  Administered 2020-08-05: 20 mg via INTRAVENOUS
  Filled 2020-08-05: qty 2

## 2020-08-05 MED ORDER — PANTOPRAZOLE SODIUM 40 MG IV SOLR
40.0000 mg | Freq: Every day | INTRAVENOUS | Status: DC
  Administered 2020-08-05 – 2020-08-10 (×6): 40 mg via INTRAVENOUS
  Filled 2020-08-05 (×6): qty 40

## 2020-08-05 MED ORDER — MELATONIN 5 MG PO TABS
5.0000 mg | ORAL_TABLET | Freq: Once | ORAL | Status: DC
  Filled 2020-08-05: qty 1

## 2020-08-05 MED ORDER — DM-GUAIFENESIN ER 30-600 MG PO TB12
1.0000 | ORAL_TABLET | Freq: Two times a day (BID) | ORAL | Status: DC
  Administered 2020-08-05 – 2020-08-10 (×11): 1 via ORAL
  Filled 2020-08-05 (×11): qty 1

## 2020-08-05 MED ORDER — AZITHROMYCIN 250 MG PO TABS
250.0000 mg | ORAL_TABLET | Freq: Every day | ORAL | Status: DC
  Administered 2020-08-06: 250 mg via ORAL
  Filled 2020-08-05: qty 1

## 2020-08-05 MED ORDER — METHYLPREDNISOLONE SODIUM SUCC 40 MG IJ SOLR: 40.0000 mg | Freq: Once | INTRAMUSCULAR | Status: DC

## 2020-08-05 MED ORDER — AZITHROMYCIN 250 MG PO TABS
500.0000 mg | ORAL_TABLET | Freq: Every day | ORAL | Status: AC
  Administered 2020-08-05: 500 mg via ORAL
  Filled 2020-08-05: qty 2

## 2020-08-05 NOTE — TOC Initial Note (Signed)
Transition of Care [facility]) - Initial/Assessment Note    Patient Details  Name: [name] [name] MRN: [id number] Date of Birth: 08/27/1945  [facility] CM/SW Contact:    Natasha Bence, LCSW Phone Number: 08/05/2020, 12:31 PM  Clinical Narrative:                 Patient is a 75 year old male admitted for CHF. CSW received consult for [facility] and CHF. CSW inquired if patient agreeable to [facility] referral. Patient reported that he was already with [facility]. CSW inquired about patient's ability to monitor his fluid intake, weigh him self daily, and eat a heart healthy diet. Patient reported that he doesn't weigh him self daily and instead weighs himself "whenever he has the chance." Patient reported that he does not follow a heart healthy diet. CSW discussed recommendations for a heart healthy diet including limiting sodium intake and monitoring cholesterol intake. CSW also encouraged patient to monitor fluid intake to assist with limiting fluid retention. TOC to follow. Expected Discharge Plan: [place] Barriers to Discharge: Continued Medical Work up   Patient Goals and CMS Choice Patient states their goals for this hospitalization and ongoing recovery are:: Return home with [facility] CMS Medicare.gov Compare Post Acute Care list provided to:: Patient Choice offered to / list presented to : Patient  Expected Discharge Plan and Services Expected Discharge Plan: [place] Acute Care Choice: NA Living arrangements for the past 2 months: Single Family Home                 DME Arranged: N/A DME Agency: NA       HH Arranged: PT HH Agency: [facility] [employer]     Representative spoke with at [place]: Will notify Tim upon d/c  Prior Living Arrangements/Services Living arrangements for the past 2 months: Single Family Home Lives with:: Self Patient language and need for interpreter reviewed:: Yes Do you feel safe  going back to the place where you live?: Yes      Need for Family Participation in Patient Care: Yes (Comment) Care giver support system in place?: Yes (comment) Current home services: Home PT Criminal Activity/Legal Involvement Pertinent to Current Situation/Hospitalization: No - Comment as needed  Activities of Daily Living Home Assistive Devices/Equipment: Cane (specify quad or straight),Shower chair with back,Walker (specify type),Bedside commode/3-in-1,Dentures (specify type),Eyeglasses ADL Screening (condition at time of admission) Patient's cognitive ability adequate to safely complete daily activities?: Yes Is the patient deaf or have difficulty hearing?: No Does the patient have difficulty seeing, even when wearing glasses/contacts?: No Does the patient have difficulty concentrating, remembering, or making decisions?: No Patient able to express need for assistance with ADLs?: Yes Does the patient have difficulty dressing or bathing?: Yes Independently performs ADLs?: Yes (appropriate for developmental age) Does the patient have difficulty walking or climbing stairs?: Yes Weakness of Legs: Both Weakness of Arms/Hands: None  Permission Sought/Granted   Permission granted to share information with : Yes, Verbal Permission Granted     Permission granted to share info w AGENCY: [facility]        Emotional Assessment     Affect (typically observed): Accepting Orientation: : Oriented to Self,Oriented to Situation,Oriented to Place,Oriented to  Time Alcohol / Substance Use: Not Applicable Psych Involvement: No (comment)  Admission diagnosis:  Hypoxia [R09.02] Acute respiratory failure with hypoxia ([place]) [J96.01] Community acquired pneumonia, unspecified laterality [J18.9] Congestive  heart failure, unspecified HF chronicity, unspecified heart failure type [facility]) [I50.9] Patient Active Problem List   Diagnosis Date Noted  . Acute on chronic diastolic CHF (congestive heart  failure) ([place]) 08/05/2020  . Acute respiratory failure with hypoxia ([place]) 08/04/2020  . Acute exacerbation of CHF (congestive heart failure) ([place]) 07/19/2020  . AF (paroxysmal atrial fibrillation) ([place]) 07/19/2020  . COVID-19   . Acute on chronic respiratory failure with hypoxia ([place]) 06/25/2020  . Aspiration pneumonia ([place]) 06/24/2020  . Pneumonia due to COVID-19 virus 06/24/2020  . Confusion   . Small bowel obstruction ([place]) 01/29/2020  . SBO (small bowel obstruction) ([place]) 01/28/2020  . Primary osteoarthritis of left knee 05/24/2018  . Primary localized osteoarthritis of left knee 05/19/2018  . Acute on chronic systolic and diastolic heart failure, NYHA class 1 ([place]) 04/01/2017  . CAD (coronary artery disease) 04/01/2017  . SOB (shortness of breath) 03/21/2017  . HTN (hypertension) 03/21/2017  . NSTEMI (non-ST elevated myocardial infarction) ([place]) 01/19/2017  . NSTEMI, initial episode of care ([place]) 01/19/2017  . Sepsis ([place]) 07/24/2016  . Elevated troponin I level 07/24/2016  . Fever 07/24/2016  . Lactic acidosis 07/24/2016  . Adjustment insomnia 05/18/2016  . Erectile dysfunction 12/19/2015  . Sleep apnea 09/30/2015  . Osteoarthritis 09/30/2015  . Hypothyroidism 09/30/2015  . Hypogonadism in male 09/30/2015  . Diabetic neuropathy ([place]) 09/30/2015  . Chronic pain of left knee 09/30/2015  . Benign essential hypertension 09/30/2015  . Acute on chronic kidney failure ([place]) 05/14/2015  . Diarrhea 05/14/2015  . Dehydration 05/14/2015  . Hypokalemia 05/14/2015  . 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[place]) 10/05/2014  . Pelvic mass in male   . Varices, esophageal ([place])   . Colonic mass   . Abnormal CT scan, pelvis   . Bladder mass   . Elevated liver enzymes   . Elevated LFTs   . Abdominal pain 07/23/2014  . Chronic kidney disease Stage IV 07/23/2014  . Morbid obesity ([place]) 07/23/2014  . Type 2 diabetes mellitus with stage 4 chronic kidney disease ([place]) 07/23/2014  . Hypertension  07/23/2014  . S/P total knee replacement, 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   PCP:  [name], [place]:   [address] [place] [address] Phone: [phone] Fax: [phone]     Social Determinants of Health (SDOH) Interventions    Readmission Risk Interventions Readmission Risk Prevention Plan 08/05/2020 06/26/2020  Transportation Screening Complete Complete  Medication Review Press photographer) Complete Complete  PCP or Specialist appointment within 3-5 days of discharge - Complete  HRI or [place] Complete Complete  SW Recovery Care/Counseling Consult Complete Complete  Palliative Care Screening Not Applicable Not Applicable  [place] Not Applicable Complete  Some recent data might be hidden

## 2020-08-05 NOTE — Progress Notes (Signed)
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  The patient claims that he has been short of breath since his discharge from the hospital when he had COVID-19 pneumonia on 06/29/2020.  However, the patient has a propensity of leaving [place]. He left AGAINST MEDICAL ADVICE after a stay from 06/24/2020 to 06/29/2020 during which she was treated for aspiration pneumonia and COVID-19 pneumonia.  In addition, the patient left AGAINST MEDICAL ADVICE again after a stay from 07/19/2020 to 07/20/2020.  He claims that he has been compliant with his furosemide 40 mg daily.  He denies any fevers, chills, chest pain, nausea, vomiting, diarrhea, abdominal pain, dysuria, hematuria.  In the emergency department, the patient was afebrile hemodynamically stable.  Oxygen saturation was in the 80s on room air.  He was placed on supplemental oxygen.  He was started on IV furosemide.  On the early morning of 08/05/2020, the patient developed respiratory distress and required an additional dose of furosemide.  He was placed on BiPAP initially.  He has been weaned to high flow nasal cannula.  Assessment/Plan: Acute respiratory failure with hypoxia -Secondary to pulmonary edema -Currently on 15L HFNC -Wean oxygen as tolerated  Acute on chronic diastolic CHF -53/61/4431 echo EF 50-55%, grade 2 DD, trivial MR -Continue IV furosemide 60 mg IV twice  daily -Daily weights -Accurate I's and O's -continue bisoprolol  Paroxysmal atrial fibrillation -Rate controlled -Continue apixaban  CKD stage 4 -baseline creatinine 2.3-2.6 -monitor with diuresis  CAD hx PCI -no chest pain -continue bisoprolol  Diabetes mellitus type 2 -11/22 A1C--5.8 -continue levemir -continue ISS  Acute metabolic Encephalopathy -due to dehydration and COVID-19 -improving  CAD -no chest pain -continue ASA, imdur, zocor -holding bisoprolol due to bradycardia  Essential HTN -continue amlodipine and imdur  Hypothyroidism -continue synthroid  Hypokalemia -replete -check mag--2.2 -improving  Thrombocytopenia -B12--525 -folate--7.0 -TSH--2.213 -suspect chronic hepatic congestion and acute medical illness  Essentialy HTN -continue amlodipine, bisoprolol  Morbid Obesity -BMI 42.35 -lifestyle modification       Status is: Inpatient  Remains inpatient appropriate because:IV treatments appropriate due to intensity of illness or inability to take PO   Dispo: The patient is from: Home              Anticipated d/c is to: Home              Anticipated d/c date is: 2 days              Patient currently is not medically stable to d/c.        Family Communication:  no Family at bedside  Consultants:  none  Code Status:  FULL  DVT Prophylaxis:  apixaban   Procedures: As Listed in Progress Note Above  Antibiotics: None       Subjective: Remains sob but better than last night.  Denies f/c, cp, n/v/d, abd pain, headache.  Objective: Vitals:   08/05/20 0144 08/05/20 0235 08/05/20 0322 08/05/20 0736  BP:  (!) 150/103    Pulse: Marland Kitchen)  101 73 75   Resp: 20 20 17    Temp:  97.8 F (36.6 C)    TempSrc:  Oral    SpO2: 99%  98% 98%  Weight:      Height:        Intake/Output Summary (Last 24 hours) at 08/05/2020 0915 Last data filed at 08/04/2020 2100 Gross per 24 hour  Intake 350 ml  Output 300 ml  Net 50 ml    Weight change:  Exam:   General:  Pt is alert, follows commands appropriately, not in acute distress  HEENT: No icterus, No thrush, No neck mass, [place]/AT  Cardiovascular: RRR, S1/S2, no rubs, no gallops  Respiratory: bibasilar rales. No wheeze  Abdomen: Soft/+BS, non tender, non distended, no guarding  Extremities: 2+LE edema, No lymphangitis, No petechiae, No rashes, no synovitis   Data Reviewed: I have personally reviewed following labs and imaging studies Basic Metabolic Panel: Recent Labs  Lab 08/04/20 1451 08/05/20 0437  NA 137 141  K 4.3 4.2  CL 102 103  CO2 28 27  GLUCOSE 63* 104*  BUN 35* 35*  CREATININE 2.60* 2.46*  CALCIUM 8.6* 8.6*  MG  --  2.2   Liver Function Tests: Recent Labs  Lab 08/04/20 1451  AST 13*  ALT 11  ALKPHOS 77  BILITOT 1.1  PROT 7.1  ALBUMIN 4.0   No results for input(s): LIPASE, AMYLASE in the last 168 hours. No results for input(s): AMMONIA in the last 168 hours. Coagulation Profile: No results for input(s): INR, PROTIME in the last 168 hours. CBC: Recent Labs  Lab 08/04/20 1451  WBC 5.9  NEUTROABS 4.2  HGB 15.0  HCT 48.3  MCV 95.1  PLT 122*   Cardiac Enzymes: No results for input(s): CKTOTAL, CKMB, CKMBINDEX, TROPONINI in the last 168 hours. BNP: Invalid input(s): POCBNP CBG: Recent Labs  Lab 08/04/20 2216 08/05/20 0802  GLUCAP 124* 123*   HbA1C: No results for input(s): HGBA1C in the last 72 hours. Urine analysis:    Component Value Date/Time   COLORURINE STRAW (A) 07/19/2020 2255   APPEARANCEUR CLEAR 07/19/2020 2255   LABSPEC 1.005 07/19/2020 2255   PHURINE 7.0 07/19/2020 2255   GLUCOSEU NEGATIVE 07/19/2020 2255   HGBUR NEGATIVE 07/19/2020 2255   BILIRUBINUR NEGATIVE 07/19/2020 2255   KETONESUR NEGATIVE 07/19/2020 2255   PROTEINUR NEGATIVE 07/19/2020 2255   UROBILINOGEN 4.0 (H) 07/23/2014 1356   NITRITE NEGATIVE 07/19/2020 2255   LEUKOCYTESUR NEGATIVE 07/19/2020 2255   Sepsis  Labs: @LABRCNTIP (procalcitonin:4,lacticidven:4) )No results found for this or any previous visit (from the past 240 hour(s)).   Scheduled Meds: . amLODipine  10 mg Oral Daily  . apixaban  5 mg Oral BID  . azithromycin  500 mg Oral Daily   Followed by  . [START ON 08/06/2020] azithromycin  250 mg Oral Daily  . bisoprolol  2.5 mg Oral Daily  . dextromethorphan-guaiFENesin  1 tablet Oral BID  . furosemide  60 mg Intravenous BID  . insulin aspart  0-15 Units Subcutaneous TID WC  . insulin aspart  0-5 Units Subcutaneous QHS  . insulin detemir  20 Units Subcutaneous QHS  . isosorbide mononitrate  30 mg Oral Daily  . levothyroxine  300 mcg Oral Q0600  . loratadine  10 mg Oral q1800  . methylPREDNISolone (SOLU-MEDROL) injection  40 mg Intravenous Q12H  . mometasone-formoterol  2 puff Inhalation BID  . pantoprazole (PROTONIX) IV  40 mg Intravenous Daily  . simvastatin  5 mg Oral Daily  .  sodium chloride flush  3 mL Intravenous Q12H   Continuous Infusions: . sodium chloride      Procedures/Studies: DG Chest 2 View  Result Date: 07/19/2020 CLINICAL DATA:  Increasing shortness of breath.  COVID 06/24/2020 EXAM: CHEST - 2 VIEW COMPARISON:  Chest x-ray 06/24/2020, CT chest 08/01/2019 FINDINGS: The heart size and mediastinal contours are within normal limits. Interval development of multifocal hazy airspace opacities. No pulmonary edema. Interval development of bilateral trace pleural effusions. No pneumothorax. No acute osseous abnormality. IMPRESSION: Multifocal pneumonia with bilateral trace pleural effusions. Followup PA and lateral chest X-ray is recommended in 3-4 weeks following therapy to ensure resolution and exclude underlying malignancy. Electronically Signed   By: Iven Finn M.D.   On: 07/19/2020 18:07   DG Chest Port 1 View  Result Date: 08/04/2020 CLINICAL DATA:  Shortness of breath.  COVID-19 positive. EXAM: PORTABLE CHEST 1 VIEW COMPARISON:  July 19, 2020. FINDINGS: Stable  cardiomediastinal silhouette. No pneumothorax or pleural effusion is noted. Stable hazy opacities are noted throughout both lungs concerning for atypical pneumonia. Bony thorax is unremarkable. IMPRESSION: Stable hazy opacities are noted throughout both lungs concerning for atypical pneumonia. Electronically Signed   By: Marijo Conception M.D.   On: 08/04/2020 13:43   ECHOCARDIOGRAM COMPLETE  Result Date: 07/20/2020    ECHOCARDIOGRAM REPORT   Patient Name:   JADARRIUS MASELLI Date of Exam: 07/20/2020 Medical Rec #:  [id number]     Height:       73.0 in Accession #:    [id number]    Weight:       308.1 lb Date of Birth:  July 04, 1946     BSA:          2.585 m Patient Age:    69 years      BP:           134/76 mmHg Patient Gender: M             HR:           58 bpm. Exam Location:  [facility] Procedure: 2D Echo Indications:    CHF-Acute Systolic K59.93  History:        Patient has prior history of Echocardiogram examinations, most                 recent 11/17/2019. Previous Myocardial Infarction and CAD,                 Signs/Symptoms:Fever; Risk Factors:Diabetes, Current Smoker and                 Hypertension. Elevated troponin.  Sonographer:    Leavy Cella RDCS (AE) Referring Phys: [id number] OLADAPO ADEFESO IMPRESSIONS  1. Left ventricular ejection fraction, by estimation, is 50 to 55%. The left ventricle has low normal function. Left ventricular endocardial border not optimally defined to evaluate regional wall motion. Left ventricular diastolic parameters are consistent with Grade II diastolic dysfunction (pseudonormalization). Elevated left atrial pressure.  2. Right ventricular systolic function is normal. The right ventricular size is normal.  3. Left atrial size was mildly dilated.  4. The mitral valve is normal in structure. Trivial mitral valve regurgitation. No evidence of mitral stenosis.  5. The aortic valve is tricuspid. There is mild calcification of the aortic valve. There is mild thickening of the  aortic valve. Aortic valve regurgitation is not visualized. No aortic stenosis is present.  6. The inferior vena cava is normal in size with greater than 50% respiratory variability, suggesting  right atrial pressure of 3 mmHg. FINDINGS  Left Ventricle: Left ventricular ejection fraction, by estimation, is 50 to 55%. The left ventricle has low normal function. Left ventricular endocardial border not optimally defined to evaluate regional wall motion. The left ventricular internal cavity  size was normal in size. There is no left ventricular hypertrophy. Left ventricular diastolic parameters are consistent with Grade II diastolic dysfunction (pseudonormalization). Elevated left atrial pressure. Right Ventricle: The right ventricular size is normal. No increase in right ventricular wall thickness. Right ventricular systolic function is normal. Left Atrium: Left atrial size was mildly dilated. Right Atrium: Right atrial size was normal in size. Pericardium: There is no evidence of pericardial effusion. Mitral Valve: The mitral valve is normal in structure. Trivial mitral valve regurgitation. No evidence of mitral valve stenosis. Tricuspid Valve: The tricuspid valve is normal in structure. Tricuspid valve regurgitation is trivial. No evidence of tricuspid stenosis. Aortic Valve: The aortic valve is tricuspid. There is mild calcification of the aortic valve. There is mild thickening of the aortic valve. There is mild aortic valve annular calcification. Aortic valve regurgitation is not visualized. Aortic regurgitation PHT measures 561 msec. No aortic stenosis is present. Aortic valve mean gradient measures 6.7 mmHg. Aortic valve peak gradient measures 13.2 mmHg. Aortic valve area, by VTI measures 1.55 cm. Pulmonic Valve: The pulmonic valve was not well visualized. Pulmonic valve regurgitation is not visualized. No evidence of pulmonic stenosis. Aorta: The aortic root is normal in size and structure. Pulmonary Artery:  Indeterminant PASPl, inadequate TR jet. Venous: The inferior vena cava is normal in size with greater than 50% respiratory variability, suggesting right atrial pressure of 3 mmHg. IAS/Shunts: No atrial level shunt detected by color flow Doppler.  LEFT VENTRICLE PLAX 2D LVOT diam:     1.90 cm  Diastology LV SV:         59       LV e' medial:    4.03 cm/s LV SV Index:   23       LV E/e' medial:  27.5 LVOT Area:     2.84 cm LV e' lateral:   9.25 cm/s                         LV E/e' lateral: 12.0  RIGHT VENTRICLE RV S prime:     12.60 cm/s TAPSE (M-mode): 1.4 cm LEFT ATRIUM              Index       RIGHT ATRIUM           Index LA Vol (A2C):   101.0 ml 39.07 ml/m RA Area:     23.20 cm LA Vol (A4C):   63.6 ml  24.60 ml/m RA Volume:   89.90 ml  34.77 ml/m LA Biplane Vol: 83.0 ml  32.10 ml/m  AORTIC VALVE AV Area (Vmax):    1.56 cm AV Area (Vmean):   1.30 cm AV Area (VTI):     1.55 cm AV Vmax:           181.34 cm/s AV Vmean:          122.441 cm/s AV VTI:            0.379 m AV Peak Grad:      13.2 mmHg AV Mean Grad:      6.7 mmHg LVOT Vmax:         99.83 cm/s LVOT Vmean:  56.149 cm/s LVOT VTI:          0.208 m LVOT/AV VTI ratio: 0.55 AI PHT:            561 msec  AORTA Ao Root diam: 3.60 cm MITRAL VALVE MV Area (PHT): 4.08 cm     SHUNTS MV Decel Time: 186 msec     Systemic VTI:  0.21 m MV E velocity: 111.00 cm/s  Systemic Diam: 1.90 cm MV A velocity: 34.30 cm/s MV E/A ratio:  3.24 Carlyle Dolly MD Electronically signed by Carlyle Dolly MD Signature Date/Time: 07/20/2020/2:58:49 PM    Final     Orson Eva, DO  [facility]  If 7PM-7AM, please contact night-coverage [email or web address] Password [email or web address] 08/05/2020, 9:15 AM   LOS: 1 day

## 2020-08-05 NOTE — Progress Notes (Signed)
Patient had acute respiratory episode.  Patient stated he felt his oxygen was dropping.  Patient was admitted on 4 liters and had been increased to 6 liters. During episode, patient had audible wheezing, and dyspnea and tachypnea. Respiratory called and breathing treatment given with little relief.  Patient oxygen saturation at this time remained in the 80s.  Patient increased to 15 liters high flow.  MD called to flow to assess patient.  MD ordered medications and bipap placed temporarily to help work of breathing.  Patient on bipap with oxygen saturations in t he upper 90s.  Patient states at this time he does feel a little better.

## 2020-08-05 NOTE — Progress Notes (Signed)
Acute respiratory distress RN called due to patient being in respiratory distress. At bedside, patient was sitting in tripod position and with increased work of breathing.  Oxygen level has been increased to 15 L HFNC.  Audible wheezing without stethoscope noted at bedside, on auscultation, the wheezing appears to be in the upper airway area with diffused distant expiratory wheezing in all lobes (patient has history of COPD). Patient already received IV Lasix 40 Mg x1 in the ED, another IV 20 mg of Lasix was given. IV Solu-Medrol 125 mg x 1 was given, patient will be started on IV Solu-Medrol twice daily Continue duo nebs, Mucinex, Solu-Medrol 40 mg twice daily, azithromycin. Continue Protonix to prevent steroid-induced ulcer Continue incentive spirometry and flutter valve when off BiPAP BiPAP was provided with improvement of symptoms

## 2020-08-05 NOTE — Plan of Care (Signed)
  Problem: Acute Rehab PT Goals(only PT should resolve) Goal: Pt Will Go Supine/Side To Sit Outcome: Progressing Flowsheets (Taken 08/05/2020 1114) Pt will go Supine/Side to Sit:  Independently  with modified independence Goal: Patient Will Transfer Sit To/From Stand Outcome: Progressing Flowsheets (Taken 08/05/2020 1114) Patient will transfer sit to/from stand:  with supervision  with modified independence Goal: Pt Will Transfer Bed To Chair/Chair To Bed Outcome: Progressing Flowsheets (Taken 08/05/2020 1114) Pt will Transfer Bed to Chair/Chair to Bed:  with supervision  with modified independence Goal: Pt Will Ambulate Outcome: Progressing Flowsheets (Taken 08/05/2020 1114) Pt will Ambulate:  with supervision  with rolling walker  50 feet   11:14 AM, 08/05/20 [name], MPT Physical Therapist with [facility] [phone] office [phone] mobile phone

## 2020-08-05 NOTE — Evaluation (Signed)
Physical Therapy Evaluation Patient Details Name: [name] [name] MRN: [id number] DOB: Mar 11, 1946 Today's Date: 08/05/2020   History of Present Illness  [name] [name] is a 75 y.o. male with a history of CAD s/p PCI 2018, LBBB, chronic HFpEF, stage IV CKD, HTN, HLD, T2DM with neuropathy, OSA, obesity (BMI 40), marginal zone lymphoma s/p chemoradiation, and recent admission for covid-19 pneumonia, on home oxygen 3L who presented to the ED with worsening dyspnea, leg swelling. He reports slight increase in cough which is mild, intermittent, nonproductive, denies fever, chills, sick contacts. Dyspnea has been constant, worsening, moderate-severe. Has never really felt his breathing improve since covid diagnosis. +Orthopnea though this is chronic, maybe a bit worse lately. No chest pain, palpitations.    Clinical Impression  Patient demonstrates slightly labored movement for sitting up at bedside,  limited to a few steps secondary to c/o fatigue and feeling cold, required multiple attempts before able to complete sit to stand and tolerated sitting up in chair after therapy.  Patient will benefit from continued physical therapy in hospital and recommended venue below to increase strength, balance, endurance for safe ADLs and gait.     Follow Up Recommendations Home health PT;Supervision for mobility/OOB;Supervision - Intermittent    Equipment Recommendations  None recommended by PT    Recommendations for Other Services       Precautions / Restrictions Precautions Precautions: Fall Restrictions Weight Bearing Restrictions: No      Mobility  Bed Mobility Overal bed mobility: Modified Independent             General bed mobility comments: HOB    Transfers Overall transfer level: Needs assistance Equipment used: Rolling walker (2 wheeled) Transfers: Sit to/from [facility] Sit to Stand: Min guard;Min assist Stand pivot transfers: Min guard       General transfer  comment: required multiple attempts before able to stand due to BLE weakness  Ambulation/Gait Ambulation/Gait assistance: Min guard Gait Distance (Feet): 5 Feet Assistive device: Rolling walker (2 wheeled) Gait Pattern/deviations: Decreased step length - right;Decreased step length - left;Decreased stride length Gait velocity: decreased   General Gait Details: limited to 5-6 steps at bedside mostly due to c/o fatigue and feeling cold  Stairs            Wheelchair Mobility    Modified Rankin (Stroke Patients Only)       Balance Overall balance assessment: Needs assistance Sitting-balance support: Feet supported;No upper extremity supported Sitting balance-Leahy Scale: Good Sitting balance - Comments: seated at EOB   Standing balance support: During functional activity;Bilateral upper extremity supported Standing balance-Leahy Scale: Fair Standing balance comment: using RW                             Pertinent Vitals/Pain Pain Assessment: No/denies pain    Home Living Family/patient expects to be discharged to:: Private residence   Available Help at Discharge: Family;Available 24 hours/day Type of Home: House Home Access: Ramped entrance     Home Layout: One level Home Equipment: Shower seat;Bedside commode;Walker - 2 wheels;Cane - single point;Toilet riser;Electric scooter;Walker - 4 wheels      Prior Function Level of Independence: Needs assistance   Gait / Transfers Assistance Needed: household ambulator using RW most of time, has to lean on nearby objects when using SPC, uses 3 LPM O2 PRN at home  ADL's / Homemaking Assistance Needed: assisted by family for community ADLs  Hand Dominance   Dominant Hand: Right    Extremity/Trunk Assessment   Upper Extremity Assessment Upper Extremity Assessment: Overall WFL for tasks assessed    Lower Extremity Assessment Lower Extremity Assessment: Generalized weakness    Cervical / Trunk  Assessment Cervical / Trunk Assessment: Normal  Communication   Communication: No difficulties  Cognition Arousal/Alertness: Awake/alert Behavior During Therapy: WFL for tasks assessed/performed Overall Cognitive Status: Within Functional Limits for tasks assessed                                        General Comments      Exercises     Assessment/Plan    PT Assessment Patient needs continued PT services  PT Problem List Decreased strength;Decreased activity tolerance;Decreased balance;Decreased mobility       PT Treatment Interventions Gait training;Functional mobility training;Therapeutic activities;Therapeutic exercise;Stair training;Patient/family education;DME instruction;Balance training    PT Goals (Current goals can be found in the Care Plan section)  Acute Rehab PT Goals Patient Stated Goal: return home with family to assist PT Goal Formulation: With patient Time For Goal Achievement: 08/09/20 Potential to Achieve Goals: Good    Frequency Min 3X/week   Barriers to discharge        Co-evaluation               AM-PAC PT "6 Clicks" Mobility  Outcome Measure Help needed turning from your back to your side while in a flat bed without using bedrails?: None Help needed moving from lying on your back to sitting on the side of a flat bed without using bedrails?: A Little Help needed moving to and from a bed to a chair (including a wheelchair)?: A Little Help needed standing up from a chair using your arms (e.g., wheelchair or bedside chair)?: A Little Help needed to walk in hospital room?: A Little Help needed climbing 3-5 steps with a railing? : A Lot 6 Click Score: 18    End of Session Equipment Utilized During Treatment: Oxygen Activity Tolerance: Patient tolerated treatment well;Patient limited by fatigue Patient left: in chair;with call bell/phone within reach Nurse Communication: Mobility status PT Visit Diagnosis: Unsteadiness on  feet (R26.81);Other abnormalities of gait and mobility (R26.89);Muscle weakness (generalized) (M62.81)    Time: 7482-7078 PT Time Calculation (min) (ACUTE ONLY): 27 min   Charges:   PT Evaluation $PT Eval Moderate Complexity: 1 Mod PT Treatments $Therapeutic Activity: 23-37 mins        11:13 AM, 08/05/20 [name], MPT Physical Therapist with [facility] [phone] office [phone] mobile phone

## 2020-08-06 DIAGNOSIS — J9601 Acute respiratory failure with hypoxia: Secondary | ICD-10-CM | POA: Diagnosis not present

## 2020-08-06 DIAGNOSIS — I5033 Acute on chronic diastolic (congestive) heart failure: Secondary | ICD-10-CM | POA: Diagnosis not present

## 2020-08-06 DIAGNOSIS — I480 Paroxysmal atrial fibrillation: Secondary | ICD-10-CM | POA: Diagnosis not present

## 2020-08-06 LAB — BASIC METABOLIC PANEL
Anion gap: 9 (ref 5–15)
BUN: 44 mg/dL — ABNORMAL HIGH (ref 8–23)
CO2: 29 mmol/L (ref 22–32)
Calcium: 8.4 mg/dL — ABNORMAL LOW (ref 8.9–10.3)
Chloride: 103 mmol/L (ref 98–111)
Creatinine, Ser: 2.81 mg/dL — ABNORMAL HIGH (ref 0.61–1.24)
GFR, Estimated: 23 mL/min — ABNORMAL LOW (ref >60)
Glucose, Bld: 72 mg/dL (ref 70–99)
Potassium: 4.3 mmol/L (ref 3.5–5.1)
Sodium: 141 mmol/L (ref 135–145)

## 2020-08-06 LAB — GLUCOSE, CAPILLARY
Glucose-Capillary: 97 mg/dL (ref 70–99)
Glucose-Capillary: 128 mg/dL — ABNORMAL HIGH (ref 70–99)
Glucose-Capillary: 97 mg/dL (ref 70–99)
Glucose-Capillary: 126 mg/dL — ABNORMAL HIGH (ref 70–99)

## 2020-08-06 MED ORDER — FUROSEMIDE 10 MG/ML IJ SOLN
80.0000 mg | Freq: Two times a day (BID) | INTRAMUSCULAR | Status: DC
  Administered 2020-08-06 – 2020-08-10 (×8): 80 mg via INTRAVENOUS
  Filled 2020-08-06 (×9): qty 8

## 2020-08-06 NOTE — Progress Notes (Signed)
Physical Therapy Treatment Patient Details Name: [name] [name] MRN: [id number] DOB: 07/16/1946 Today's Date: 08/06/2020    History of Present Illness [name] [name] is a 75 y.o. male with a history of CAD s/p PCI 2018, LBBB, chronic HFpEF, stage IV CKD, HTN, HLD, T2DM with neuropathy, OSA, obesity (BMI 40), marginal zone lymphoma s/p chemoradiation, and recent admission for covid-19 pneumonia, on home oxygen 3L who presented to the ED with worsening dyspnea, leg swelling. He reports slight increase in cough which is mild, intermittent, nonproductive, denies fever, chills, sick contacts. Dyspnea has been constant, worsening, moderate-severe. Has never really felt his breathing improve since covid diagnosis. +Orthopnea though this is chronic, maybe a bit worse lately. No chest pain, palpitations.    PT Comments    Pt agreeable to exercise, requests O2 be turned back to 2L O2 because "this is a waste" and "2 is my normal". Pt on 15L O2 upon arrival and SpO2 99% so weaned to 10L O2 and pt able to complete exercises and transfer to chair with SpO2 93-94% and mild SOB noted. Pt cued for pursed lip breathing with mobility and hand placement with transfer to improve safety. Pt tolerates remaining up in chair at EOS with daughter in room. Ambulation not attempted due to pt slept through lunch and lunch tray provided once pt in chair. Pt educated on SpO2 values and to use call bell if feeling SOB or wanting SpO2 checked and pt verbalized understanding. RN notified of SpO2 and pt now on 10L O2 and verbalizes understanding. Pt will benefit from continued physical therapy in hospital and recommendations below to increase strength, balance, endurance for safe ADLs and gait.    Follow Up Recommendations  Home health PT;Supervision for mobility/OOB;Supervision - Intermittent     Equipment Recommendations  None recommended by PT    Recommendations for Other Services       Precautions / Restrictions       Mobility  Bed Mobility  General bed mobility comments: sitting EOB upon arrival  Transfers Overall transfer level: Needs assistance Equipment used: None Transfers: Sit to/from Stand;Stand Pivot Transfers Sit to Stand: Min guard Stand pivot transfers: Min guard  General transfer comment: rocking momentum with UE assisting to power up, places hands on chair armrests upon standing to steady self and pivot around to sitting, declines RW use, therapist managing O2 tubing for safety  Ambulation/Gait  General Gait Details: ambulation not attempted due to lunch arrival   Stairs             Wheelchair Mobility    Modified Rankin (Stroke Patients Only)       Balance Overall balance assessment: Needs assistance Sitting-balance support: Feet supported;No upper extremity supported Sitting balance-Leahy Scale: Good Sitting balance - Comments: seated EOB   Standing balance support: During functional activity;Bilateral upper extremity supported Standing balance-Leahy Scale: Poor Standing balance comment: reliant on UE support         Cognition Arousal/Alertness: Awake/alert Behavior During Therapy: WFL for tasks assessed/performed Overall Cognitive Status: Within Functional Limits for tasks assessed               Exercises General Exercises - Lower Extremity Long Arc Quad: Seated;AROM;Strengthening;Both;20 reps Hip Flexion/Marching: Seated;AROM;Strengthening;Both;20 reps    General Comments General comments (skin integrity, edema, etc.): Pt on 15L O2 with SpO2 99% during exercise, titrated down to 10L O2 with SpO2 93-94% with exercise and transfer; pt on 10L O2 at EOS with SpO2 94%, pt up in chair eating  lunch- RN notified      Pertinent Vitals/Pain Pain Assessment: No/denies pain    Home Living                      Prior Function            PT Goals (current goals can now be found in the care plan section) Acute Rehab PT Goals Patient Stated  Goal: return home with family to assist PT Goal Formulation: With patient Time For Goal Achievement: 08/09/20 Potential to Achieve Goals: Good Progress towards PT goals: Progressing toward goals    Frequency    Min 3X/week      PT Plan Current plan remains appropriate    Co-evaluation              AM-PAC PT "6 Clicks" Mobility   Outcome Measure  Help needed turning from your back to your side while in a flat bed without using bedrails?: None Help needed moving from lying on your back to sitting on the side of a flat bed without using bedrails?: A Little Help needed moving to and from a bed to a chair (including a wheelchair)?: A Little Help needed standing up from a chair using your arms (e.g., wheelchair or bedside chair)?: A Little Help needed to walk in hospital room?: A Little Help needed climbing 3-5 steps with a railing? : A Lot 6 Click Score: 18    End of Session Equipment Utilized During Treatment: Oxygen Activity Tolerance: Patient tolerated treatment well;Patient limited by fatigue Patient left: in chair;with call bell/phone within reach;with family/visitor present Nurse Communication: Mobility status;Other (comment) (O2) PT Visit Diagnosis: Unsteadiness on feet (R26.81);Other abnormalities of gait and mobility (R26.89);Muscle weakness (generalized) (M62.81)     Time: 4970-2637 PT Time Calculation (min) (ACUTE ONLY): 22 min  Charges:  $Therapeutic Activity: 8-22 mins                      Tori Jaylene Schrom PT, DPT 08/06/20, 2:12 PM [phone]

## 2020-08-06 NOTE — Progress Notes (Signed)
PT Cancellation Note  Patient Details Name: [name] [name] MRN: [id number] DOB: February 05, 1946   Cancelled Treatment:    Reason Eval/Treat Not Completed: Other (comment) (pt taking a nap). Upon entry into room, pt resting in supine. Pt opens eyes to name and requests therapy check back later today. Will check back as schedule permits.    Talbot Grumbling PT, DPT 08/06/20, 11:45 AM [phone]

## 2020-08-06 NOTE — Progress Notes (Addendum)
PROGRESS NOTE  [name] [name] MCN:[id number] DOB: 07-May-1946 DOA: 08/04/2020 PCP: [name]  Brief History:  75 y.o.malewith medical history significant ofCAD s/p PCI 2018, history of NSTEMI, HFpEF LBBB, stage IV CKD, hypertension, recent COVID-19 pneumonia, hyperlipidemia, hypothyroidism, class III obesity, history of non-Hodgkin's lymphoma (marginal zone) with chemo and radiotherapy, sleep apnea, peripheral neuropathy, type 2 diabetes mellitus presenting with increasing lower extremity edema, increasing abdominal girth, and worsening shortness of breath for the past week.  The patient claims that he has been short of breath since his discharge from the hospital when he had COVID-19 pneumonia on 06/29/2020.  However, the patient has a propensity of leaving [place]. He left AGAINST MEDICAL ADVICE after a stay from 06/24/2020 to 06/29/2020 during which she was treated for aspiration pneumonia and COVID-19 pneumonia.  In addition, the patient left AGAINST MEDICAL ADVICE again after a stay from 07/19/2020 to 07/20/2020.  He claims that he has been compliant with his furosemide 40 mg daily.  He denies any fevers, chills, chest pain, nausea, vomiting, diarrhea, abdominal pain, dysuria, hematuria.  In the emergency department, the patient was afebrile hemodynamically stable.  Oxygen saturation was in the 80s on room air.  He was placed on supplemental oxygen.  He was started on IV furosemide.  On the early morning of 08/05/2020, the patient developed respiratory distress and required an additional dose of furosemide.  He was placed on BiPAP initially.  He has been weaned to high flow nasal cannula.  Assessment/Plan: Acute respiratory failure with hypoxia -Secondary to pulmonary edema -Currently on 15L HFNC>>10L -Wean oxygen as tolerated -PCT<0.10-->stop abx  Acute on chronic diastolic CHF -62/94/7654 echo EF 50-55%, grade 2 DD, trivial MR -Increase  IV furosemide 80 mg IV twice daily -Daily weights -Accurate I's and O's -holding bisoprolol for bradycardia  Paroxysmal atrial fibrillation -Rate controlled -Continue apixaban  CKD stage 4 -baseline creatinine 2.3-2.6 -monitor with diuresis -will need to tolerated higher serum creatinine for improved euvolemia  CAD hx PCI -no chest pain -continue bisoprolol  Diabetes mellitus type 2 -11/22 A1C--5.8 -continue levemir -continue ISS -CBGs controlled  CAD -no chest pain -continue ASA, imdur, zocor -holding bisoprolol due to bradycardia  Essential HTN -continue amlodipine and imdur  Hypothyroidism -continue synthroid  Hypokalemia -replete -check mag--2.2 -improving  Thrombocytopenia -B12--525 -folate--7.0 -TSH--2.213 -suspect chronic hepatic congestion and acute medical illness  Essentialy HTN -continue amlodipine, bisoprolol  Morbid Obesity -BMI 42.35 -lifestyle modification       Status is: Inpatient  Remains inpatient appropriate because:IV treatments appropriate due to intensity of illness or inability to take PO   Dispo: The patient is from: Home  Anticipated d/c is to: Home  Anticipated d/c date is: 2 days  Patient currently is not medically stable to d/c.        Family Communication:  no Family at bedside  Consultants:  none  Code Status:  FULL  DVT Prophylaxis:  apixaban   Procedures: As Listed in Progress Note Above  Antibiotics: Ceftriaxone 1/2 azithro 1/2>>1/4     Subjective: Patient states sob is a little better.  Denies f/c, cp, n/v/d, abd pain  Objective: Vitals:   08/05/20 2055 08/06/20 0457 08/06/20 0723 08/06/20 1430  BP:  119/69  107/61  Pulse:  (!) 56  90  Resp:  16  16  Temp:  98 F (36.7 C)  97.7 F (36.5 C)  TempSrc:  Oral  Oral  SpO2:  97% 99% 99% 99%  Weight:  (!) 149.3 kg    Height:        Intake/Output Summary (Last  24 hours) at 08/06/2020 1641 Last data filed at 08/06/2020 0941 Gross per 24 hour  Intake 960 ml  Output 1400 ml  Net -440 ml   Weight change: 11 kg Exam:   General:  Pt is alert, follows commands appropriately, not in acute distress  HEENT: No icterus, No thrush, No neck mass, [place]/AT  Cardiovascular: IRRR, S1/S2, no rubs, no gallops+JVD  Respiratory: bibasilar crackles. No wheeze  Abdomen: Soft/+BS, non tender, non distended, no guarding  Extremities: 2+LE edema, No lymphangitis, No petechiae, No rashes, no synovitis   Data Reviewed: I have personally reviewed following labs and imaging studies Basic Metabolic Panel: Recent Labs  Lab 08/04/20 1451 08/05/20 0437 08/06/20 0423  NA 137 141 141  K 4.3 4.2 4.3  CL 102 103 103  CO2 28 27 29   GLUCOSE 63* 104* 72  BUN 35* 35* 44*  CREATININE 2.60* 2.46* 2.81*  CALCIUM 8.6* 8.6* 8.4*  MG  --  2.2  --    Liver Function Tests: Recent Labs  Lab 08/04/20 1451  AST 13*  ALT 11  ALKPHOS 77  BILITOT 1.1  PROT 7.1  ALBUMIN 4.0   No results for input(s): LIPASE, AMYLASE in the last 168 hours. No results for input(s): AMMONIA in the last 168 hours. Coagulation Profile: No results for input(s): INR, PROTIME in the last 168 hours. CBC: Recent Labs  Lab 08/04/20 1451  WBC 5.9  NEUTROABS 4.2  HGB 15.0  HCT 48.3  MCV 95.1  PLT 122*   Cardiac Enzymes: No results for input(s): CKTOTAL, CKMB, CKMBINDEX, TROPONINI in the last 168 hours. BNP: Invalid input(s): POCBNP CBG: Recent Labs  Lab 08/05/20 1126 08/05/20 1703 08/05/20 2035 08/06/20 0726 08/06/20 1130  GLUCAP 215* 150* 235* 97 128*   HbA1C: No results for input(s): HGBA1C in the last 72 hours. Urine analysis:    Component Value Date/Time   COLORURINE STRAW (A) 07/19/2020 2255   APPEARANCEUR CLEAR 07/19/2020 2255   LABSPEC 1.005 07/19/2020 2255   PHURINE 7.0 07/19/2020 2255   GLUCOSEU NEGATIVE 07/19/2020 2255   HGBUR NEGATIVE 07/19/2020 2255    BILIRUBINUR NEGATIVE 07/19/2020 2255   KETONESUR NEGATIVE 07/19/2020 2255   PROTEINUR NEGATIVE 07/19/2020 2255   UROBILINOGEN 4.0 (H) 07/23/2014 1356   NITRITE NEGATIVE 07/19/2020 2255   LEUKOCYTESUR NEGATIVE 07/19/2020 2255   Sepsis Labs: @LABRCNTIP (procalcitonin:4,lacticidven:4) ) Recent Results (from the past 240 hour(s))  SARS CORONAVIRUS 2 (Cailin Gebel 6-24 HRS) Nasopharyngeal Nasopharyngeal Swab     Status: None   Collection Time: 08/04/20  6:57 PM   Specimen: Nasopharyngeal Swab  Result Value Ref Range Status   SARS Coronavirus 2 NEGATIVE NEGATIVE Final    Comment: (NOTE) SARS-CoV-2 target nucleic acids are NOT DETECTED.  The SARS-CoV-2 RNA is generally detectable in upper and lower respiratory specimens during the acute phase of infection. Negative results do not preclude SARS-CoV-2 infection, do not rule out co-infections with other pathogens, and should not be used as the sole basis for treatment or other patient management decisions. Negative results must be combined with clinical observations, patient history, and epidemiological information. The expected result is Negative.  Fact Sheet for Patients: SugarRoll.be  Fact Sheet for Healthcare Providers: https://www.woods-mathews.com/  This test is not yet approved or cleared by the [place] FDA and  has been authorized for detection and/or diagnosis of SARS-CoV-2 by FDA under an  Emergency Use Authorization (EUA). This EUA will remain  in effect (meaning this test can be used) for the duration of the COVID-19 declaration under Se ction 564(b)(1) of the Act, 21 U.S.C. section 360bbb-3(b)(1), unless the authorization is terminated or revoked sooner.  Performed at [facility], [place] [address], [place], [place] [address]      Scheduled Meds: . amLODipine  10 mg Oral Daily  . apixaban  5 mg Oral BID  . azithromycin  250 mg Oral Daily  . bisoprolol  2.5 mg Oral Daily  .  dextromethorphan-guaiFENesin  1 tablet Oral BID  . furosemide  80 mg Intravenous BID  . insulin aspart  0-15 Units Subcutaneous TID WC  . insulin aspart  0-5 Units Subcutaneous QHS  . insulin detemir  20 Units Subcutaneous QHS  . isosorbide mononitrate  30 mg Oral Daily  . levothyroxine  300 mcg Oral Q0600  . loratadine  10 mg Oral q1800  . mometasone-formoterol  2 puff Inhalation BID  . pantoprazole (PROTONIX) IV  40 mg Intravenous Daily  . simvastatin  5 mg Oral Daily  . sodium chloride flush  3 mL Intravenous Q12H   Continuous Infusions: . sodium chloride      Procedures/Studies: DG Chest 2 View  Result Date: 07/19/2020 CLINICAL DATA:  Increasing shortness of breath.  COVID 06/24/2020 EXAM: CHEST - 2 VIEW COMPARISON:  Chest x-ray 06/24/2020, CT chest 08/01/2019 FINDINGS: The heart size and mediastinal contours are within normal limits. Interval development of multifocal hazy airspace opacities. No pulmonary edema. Interval development of bilateral trace pleural effusions. No pneumothorax. No acute osseous abnormality. IMPRESSION: Multifocal pneumonia with bilateral trace pleural effusions. Followup PA and lateral chest X-ray is recommended in 3-4 weeks following therapy to ensure resolution and exclude underlying malignancy. Electronically Signed   By: Iven Finn M.D.   On: 07/19/2020 18:07   DG Chest Port 1 View  Result Date: 08/04/2020 CLINICAL DATA:  Shortness of breath.  COVID-19 positive. EXAM: PORTABLE CHEST 1 VIEW COMPARISON:  July 19, 2020. FINDINGS: Stable cardiomediastinal silhouette. No pneumothorax or pleural effusion is noted. Stable hazy opacities are noted throughout both lungs concerning for atypical pneumonia. Bony thorax is unremarkable. IMPRESSION: Stable hazy opacities are noted throughout both lungs concerning for atypical pneumonia. Electronically Signed   By: Marijo Conception M.D.   On: 08/04/2020 13:43   ECHOCARDIOGRAM COMPLETE  Result Date: 07/20/2020     ECHOCARDIOGRAM REPORT   Patient Name:   [name] [name] Date of Exam: 07/20/2020 Medical Rec #:  [id number]     Height:       73.0 in Accession #:    [id number]    Weight:       308.1 lb Date of Birth:  03/25/1946     BSA:          2.585 m Patient Age:    40 years      BP:           134/76 mmHg Patient Gender: M             HR:           58 bpm. Exam Location:  [facility] Procedure: 2D Echo Indications:    CHF-Acute Systolic T41.96  History:        Patient has prior history of Echocardiogram examinations, most                 recent 11/17/2019. Previous Myocardial Infarction and CAD,  Signs/Symptoms:Fever; Risk Factors:Diabetes, Current Smoker and                 Hypertension. Elevated troponin.  Sonographer:    Leavy Cella RDCS (AE) Referring Phys: [id number] OLADAPO ADEFESO IMPRESSIONS  1. Left ventricular ejection fraction, by estimation, is 50 to 55%. The left ventricle has low normal function. Left ventricular endocardial border not optimally defined to evaluate regional wall motion. Left ventricular diastolic parameters are consistent with Grade II diastolic dysfunction (pseudonormalization). Elevated left atrial pressure.  2. Right ventricular systolic function is normal. The right ventricular size is normal.  3. Left atrial size was mildly dilated.  4. The mitral valve is normal in structure. Trivial mitral valve regurgitation. No evidence of mitral stenosis.  5. The aortic valve is tricuspid. There is mild calcification of the aortic valve. There is mild thickening of the aortic valve. Aortic valve regurgitation is not visualized. No aortic stenosis is present.  6. The inferior vena cava is normal in size with greater than 50% respiratory variability, suggesting right atrial pressure of 3 mmHg. FINDINGS  Left Ventricle: Left ventricular ejection fraction, by estimation, is 50 to 55%. The left ventricle has low normal function. Left ventricular endocardial border not optimally defined to  evaluate regional wall motion. The left ventricular internal cavity  size was normal in size. There is no left ventricular hypertrophy. Left ventricular diastolic parameters are consistent with Grade II diastolic dysfunction (pseudonormalization). Elevated left atrial pressure. Right Ventricle: The right ventricular size is normal. No increase in right ventricular wall thickness. Right ventricular systolic function is normal. Left Atrium: Left atrial size was mildly dilated. Right Atrium: Right atrial size was normal in size. Pericardium: There is no evidence of pericardial effusion. Mitral Valve: The mitral valve is normal in structure. Trivial mitral valve regurgitation. No evidence of mitral valve stenosis. Tricuspid Valve: The tricuspid valve is normal in structure. Tricuspid valve regurgitation is trivial. No evidence of tricuspid stenosis. Aortic Valve: The aortic valve is tricuspid. There is mild calcification of the aortic valve. There is mild thickening of the aortic valve. There is mild aortic valve annular calcification. Aortic valve regurgitation is not visualized. Aortic regurgitation PHT measures 561 msec. No aortic stenosis is present. Aortic valve mean gradient measures 6.7 mmHg. Aortic valve peak gradient measures 13.2 mmHg. Aortic valve area, by VTI measures 1.55 cm. Pulmonic Valve: The pulmonic valve was not well visualized. Pulmonic valve regurgitation is not visualized. No evidence of pulmonic stenosis. Aorta: The aortic root is normal in size and structure. Pulmonary Artery: Indeterminant PASPl, inadequate TR jet. Venous: The inferior vena cava is normal in size with greater than 50% respiratory variability, suggesting right atrial pressure of 3 mmHg. IAS/Shunts: No atrial level shunt detected by color flow Doppler.  LEFT VENTRICLE PLAX 2D LVOT diam:     1.90 cm  Diastology LV SV:         59       LV e' medial:    4.03 cm/s LV SV Index:   23       LV E/e' medial:  27.5 LVOT Area:     2.84 cm  LV e' lateral:   9.25 cm/s                         LV E/e' lateral: 12.0  RIGHT VENTRICLE RV S prime:     12.60 cm/s TAPSE (M-mode): 1.4 cm LEFT ATRIUM  Index       RIGHT ATRIUM           Index LA Vol (A2C):   101.0 ml 39.07 ml/m RA Area:     23.20 cm LA Vol (A4C):   63.6 ml  24.60 ml/m RA Volume:   89.90 ml  34.77 ml/m LA Biplane Vol: 83.0 ml  32.10 ml/m  AORTIC VALVE AV Area (Vmax):    1.56 cm AV Area (Vmean):   1.30 cm AV Area (VTI):     1.55 cm AV Vmax:           181.34 cm/s AV Vmean:          122.441 cm/s AV VTI:            0.379 m AV Peak Grad:      13.2 mmHg AV Mean Grad:      6.7 mmHg LVOT Vmax:         99.83 cm/s LVOT Vmean:        56.149 cm/s LVOT VTI:          0.208 m LVOT/AV VTI ratio: 0.55 AI PHT:            561 msec  AORTA Ao Root diam: 3.60 cm MITRAL VALVE MV Area (PHT): 4.08 cm     SHUNTS MV Decel Time: 186 msec     Systemic VTI:  0.21 m MV E velocity: 111.00 cm/s  Systemic Diam: 1.90 cm MV A velocity: 34.30 cm/s MV E/A ratio:  3.24 Carlyle Dolly MD Electronically signed by Carlyle Dolly MD Signature Date/Time: 07/20/2020/2:58:49 PM    Final     Orson Eva, DO  [facility]  If 7PM-7AM, please contact night-coverage [email or web address] Password [email or web address] 08/06/2020, 4:41 PM   LOS: 2 days

## 2020-08-07 DIAGNOSIS — J9601 Acute respiratory failure with hypoxia: Secondary | ICD-10-CM | POA: Diagnosis not present

## 2020-08-07 DIAGNOSIS — I5033 Acute on chronic diastolic (congestive) heart failure: Secondary | ICD-10-CM | POA: Diagnosis not present

## 2020-08-07 DIAGNOSIS — I480 Paroxysmal atrial fibrillation: Secondary | ICD-10-CM | POA: Diagnosis not present

## 2020-08-07 LAB — BASIC METABOLIC PANEL
Anion gap: 9 (ref 5–15)
BUN: 50 mg/dL — ABNORMAL HIGH (ref 8–23)
CO2: 29 mmol/L (ref 22–32)
Calcium: 8.3 mg/dL — ABNORMAL LOW (ref 8.9–10.3)
Chloride: 102 mmol/L (ref 98–111)
Creatinine, Ser: 2.8 mg/dL — ABNORMAL HIGH (ref 0.61–1.24)
GFR, Estimated: 23 mL/min — ABNORMAL LOW (ref >60)
Glucose, Bld: 104 mg/dL — ABNORMAL HIGH (ref 70–99)
Potassium: 3.9 mmol/L (ref 3.5–5.1)
Sodium: 140 mmol/L (ref 135–145)

## 2020-08-07 LAB — GLUCOSE, CAPILLARY
Glucose-Capillary: 80 mg/dL (ref 70–99)
Glucose-Capillary: 138 mg/dL — ABNORMAL HIGH (ref 70–99)
Glucose-Capillary: 90 mg/dL (ref 70–99)
Glucose-Capillary: 94 mg/dL (ref 70–99)

## 2020-08-07 NOTE — Progress Notes (Signed)
Patient refusing CPAP/Bipap.

## 2020-08-07 NOTE — Progress Notes (Signed)
PROGRESS NOTE   [name] [name]  TTS:[id number] DOB: September 21, 1945 DOA: 08/04/2020 PCP: [name]   Chief Complaint  Patient presents with  . 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 On the early morning of 08/05/2020, the patient developed respiratory distress and required an additional dose of furosemide. He was placed on BiPAP initially. He has been weaned to high flow nasal cannula.  Assessment & Plan:   Active Problems:   Chronic kidney disease Stage IV   Morbid obesity (HCC)   Type 2 diabetes mellitus with stage 4 chronic kidney disease (HCC)   Hypertension   Sleep apnea   Hypothyroidism   Diabetic neuropathy (HCC)   CAD (coronary artery disease)   Acute on chronic respiratory failure with hypoxia (HCC)   Acute exacerbation of CHF (congestive heart failure) (HCC)   AF (paroxysmal atrial fibrillation) (HCC)   Acute respiratory failure with hypoxia (HCC)   Acute on chronic diastolic CHF (congestive heart failure) ([place])   1. Acute diastolic Heart failure - Pt remains very volume overloaded, continue IV lasix for now.   2. Acute respiratory failure with hypoxia - continue supplemental oxygen, wean as able.  3. PAF - rate is controlled, he is fully anticoagulated with apixaban.  4. Stage 4 CKD - continue to monitor in the setting of IV diuresis.  5. Type 2 diabetes mellitus - continue current mgmt.  6. CAD - stable, no CP symptoms.   7. Bradycardia - may need to stop bisoprolol if  persists.   8. Essential hypertension - resumed home amlodipine and imdur.  9. Morbid obesity - needs intensive education and lifestyle changes.   DVT prophylaxis: apixaban Code Status: full  Family Communication:  Disposition: Home with HH   Status is: Inpatient  Remains inpatient appropriate because:IV treatments appropriate due to intensity of illness or inability to take PO and Inpatient level of care appropriate due to severity of illness   Dispo:  Patient From: Home  Planned Disposition: Home with Health Care Svc  Expected discharge date: 08/10/2020  Medically stable for discharge: No   Consultants:   n/a  Procedures:   N/a   Antimicrobials:    Subjective: Pt reports that he feels well.  He says that he has increasing edema in the legs.  He is urinating better on IV lasix.  He is asking about his home regimen.  No chest pain and no SOB.   Objective: Vitals:   08/07/20 0500 08/07/20 0552 08/07/20 0730 08/07/20 1000  BP:  121/64  113/69  Pulse:  64  (!) 59  Resp:  20  16  Temp:  98.3 F (36.8 C)    TempSrc:  Oral    SpO2:  97% 96% 99%  Weight: (!) 149 kg     Height:        Intake/Output Summary (Last 24 hours) at 08/07/2020 1323 Last data filed at 08/07/2020 0550 Gross per 24 hour  Intake 240 ml  Output 2100 ml  Net -1860 ml   Filed Weights   08/04/20 2156 08/06/20 0457 08/07/20 0500  Weight: (!) 145.6 kg (!) 149.3 kg (!) 149 kg  Examination:  General exam: sitting up in chair, NAD, Appears calm and comfortable  Respiratory system: Clear to auscultation. Respiratory effort normal. Cardiovascular system: normal S1 & S2 heard. No JVD, murmurs, rubs, gallops or clicks. 2+ pitting edema BLEs Gastrointestinal system: Abdomen is nondistended, soft and nontender. No organomegaly or masses felt. Normal bowel sounds heard. Central nervous system: Alert and oriented. No focal neurological deficits. Extremities: Symmetric 5 x 5 power. Skin: No rashes, lesions  or ulcers Psychiatry: Judgement and insight appear poor. Mood & affect appropriate.   Data Reviewed: I have personally reviewed following labs and imaging studies  CBC: Recent Labs  Lab 08/04/20 1451  WBC 5.9  NEUTROABS 4.2  HGB 15.0  HCT 48.3  MCV 95.1  PLT 122*    Basic Metabolic Panel: Recent Labs  Lab 08/04/20 1451 08/05/20 0437 08/06/20 0423 08/07/20 0423  NA 137 141 141 140  K 4.3 4.2 4.3 3.9  CL 102 103 103 102  CO2 28 27 29 29   GLUCOSE 63* 104* 72 104*  BUN 35* 35* 44* 50*  CREATININE 2.60* 2.46* 2.81* 2.80*  CALCIUM 8.6* 8.6* 8.4* 8.3*  MG  --  2.2  --   --     GFR: Estimated Creatinine Clearance: 35.7 mL/min (A) (by C-G formula based on SCr of 2.8 mg/dL (H)).  Liver Function Tests: Recent Labs  Lab 08/04/20 1451  AST 13*  ALT 11  ALKPHOS 77  BILITOT 1.1  PROT 7.1  ALBUMIN 4.0    CBG: Recent Labs  Lab 08/06/20 1130 08/06/20 1641 08/06/20 2117 08/07/20 0827 08/07/20 1117  GLUCAP 128* 97 126* 80 138*    Recent Results (from the past 240 hour(s))  SARS CORONAVIRUS 2 (TAT 6-24 HRS) Nasopharyngeal Nasopharyngeal Swab     Status: None   Collection Time: 08/04/20  6:57 PM   Specimen: Nasopharyngeal Swab  Result Value Ref Range Status   SARS Coronavirus 2 NEGATIVE NEGATIVE Final    Comment: (NOTE) SARS-CoV-2 target nucleic acids are NOT DETECTED.  The SARS-CoV-2 RNA is generally detectable in upper and lower respiratory specimens during the acute phase of infection. Negative results do not preclude SARS-CoV-2 infection, do not rule out co-infections with other pathogens, and should not be used as the sole basis for treatment or other patient management decisions. Negative results must be combined with clinical observations, patient history, and epidemiological information. The expected result is Negative.  Fact Sheet for Patients: SugarRoll.be  Fact Sheet for Healthcare  Providers: https://www.woods-mathews.com/  This test is not yet approved or cleared by the [place] FDA and  has been authorized for detection and/or diagnosis of SARS-CoV-2 by FDA under an Emergency Use Authorization (EUA). This EUA will remain  in effect (meaning this test can be used) for the duration of the COVID-19 declaration under Se ction 564(b)(1) of the Act, 21 U.S.C. section 360bbb-3(b)(1), unless the authorization is terminated or revoked sooner.  Performed at [facility], [place] [address], [place], [place] [address]      Radiology Studies: No results found.   Scheduled Meds: . amLODipine  10 mg Oral Daily  . apixaban  5 mg Oral BID  . bisoprolol  2.5 mg Oral Daily  . dextromethorphan-guaiFENesin  1 tablet Oral BID  . furosemide  80 mg Intravenous BID  . insulin aspart  0-15 Units Subcutaneous TID WC  . insulin aspart  0-5 Units Subcutaneous QHS  . insulin detemir  20 Units Subcutaneous QHS  . isosorbide mononitrate  30  mg Oral Daily  . levothyroxine  300 mcg Oral Q0600  . loratadine  10 mg Oral q1800  . mometasone-formoterol  2 puff Inhalation BID  . pantoprazole (PROTONIX) IV  40 mg Intravenous Daily  . simvastatin  5 mg Oral Daily  . sodium chloride flush  3 mL Intravenous Q12H   Continuous Infusions: . sodium chloride       LOS: 3 days   Time spent: 38 mins   Elody Kleinsasser Wynetta Emery, MD How to contact the [facility] Attending or Consulting provider [place] or covering provider during after hours [place], for this patient?  1. Check the care team in [facility] and look for a) attending/consulting TRH provider listed and b) the [facility] team listed 2. Log into [email or web address] and use [place]'s universal password to access. If you do not have the password, please contact the hospital operator. 3. Locate the [facility] provider you are looking for under [facility] and page to a number that you can be directly reached. 4. If you still have difficulty reaching  the provider, please page the [facility] (Director on Call) for the Hospitalists listed on amion for assistance.  08/07/2020, 1:23 PM

## 2020-08-08 ENCOUNTER — Encounter (HOSPITAL_COMMUNITY): Payer: Self-pay | Admitting: Family Medicine

## 2020-08-08 DIAGNOSIS — I4819 Other persistent atrial fibrillation: Secondary | ICD-10-CM

## 2020-08-08 LAB — BASIC METABOLIC PANEL
Anion gap: 12 (ref 5–15)
BUN: 53 mg/dL — ABNORMAL HIGH (ref 8–23)
CO2: 27 mmol/L (ref 22–32)
Calcium: 8.2 mg/dL — ABNORMAL LOW (ref 8.9–10.3)
Chloride: 101 mmol/L (ref 98–111)
Creatinine, Ser: 2.71 mg/dL — ABNORMAL HIGH (ref 0.61–1.24)
GFR, Estimated: 24 mL/min — ABNORMAL LOW (ref >60)
Glucose, Bld: 89 mg/dL (ref 70–99)
Potassium: 3.5 mmol/L (ref 3.5–5.1)
Sodium: 140 mmol/L (ref 135–145)

## 2020-08-08 LAB — GLUCOSE, CAPILLARY
Glucose-Capillary: 118 mg/dL — ABNORMAL HIGH (ref 70–99)
Glucose-Capillary: 134 mg/dL — ABNORMAL HIGH (ref 70–99)
Glucose-Capillary: 113 mg/dL — ABNORMAL HIGH (ref 70–99)
Glucose-Capillary: 174 mg/dL — ABNORMAL HIGH (ref 70–99)

## 2020-08-08 LAB — T4, FREE: Free T4: 0.83 ng/dL (ref 0.61–1.12)

## 2020-08-08 LAB — TSH: TSH: 7.776 u[IU]/mL — ABNORMAL HIGH (ref 0.350–4.500)

## 2020-08-08 MED ORDER — INFLUENZA VAC A&B SA ADJ QUAD 0.5 ML IM PRSY
0.5000 mL | PREFILLED_SYRINGE | INTRAMUSCULAR | Status: AC
  Administered 2020-08-09: 0.5 mL via INTRAMUSCULAR
  Filled 2020-08-08: qty 0.5

## 2020-08-08 MED ORDER — POTASSIUM CHLORIDE CRYS ER 20 MEQ PO TBCR
20.0000 meq | EXTENDED_RELEASE_TABLET | Freq: Every day | ORAL | Status: DC
  Administered 2020-08-08: 20 meq via ORAL
  Filled 2020-08-08 (×2): qty 1

## 2020-08-08 NOTE — Progress Notes (Signed)
PROGRESS NOTE   [name] [name]  UXL:[id number] DOB: May 29, 1946 DOA: 08/04/2020 [facility]: [name]   Chief Complaint  Patient presents with  . 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 On the early morning of 08/05/2020, the patient developed respiratory distress and required an additional dose of furosemide. He was placed on BiPAP initially. He has been weaned to high flow nasal cannula.  Assessment & Plan:   Active Problems:   Chronic kidney disease Stage IV   Morbid obesity (HCC)   Type 2 diabetes mellitus with stage 4 chronic kidney disease (HCC)   Hypertension   Sleep apnea   Hypothyroidism   Diabetic neuropathy (HCC)   CAD (coronary artery disease)   Acute on chronic respiratory failure with hypoxia (HCC)   Acute exacerbation of CHF (congestive heart failure) (HCC)   AF (paroxysmal atrial fibrillation) (HCC)   Acute respiratory failure with hypoxia (HCC)   Acute on chronic diastolic CHF (congestive heart failure) ([place])   1. Acute diastolic Heart failure - Pt remains very volume overloaded, continue IV lasix for now, unable to determine baseline weight, will ask for inpatient cardiology consult as they know patient and have been following him outpatient.   2. Acute respiratory failure with hypoxia - continue supplemental oxygen, wean as able.  3. Persistent atrial fibrillation  - rate is controlled, holding bisoprolol due to bradycardia, he is fully anticoagulated with apixaban.   4. Stage 4 CKD - continue to monitor in the setting of IV diuresis.  5. Type 2 diabetes mellitus - continue current mgmt.  6. CAD - stable, no CP symptoms.   7. Bradycardia - holding bisoprolol.   8. Essential hypertension - resumed home amlodipine and imdur.  9. Morbid obesity - needs intensive education and lifestyle changes.   DVT prophylaxis: apixaban Code Status: full  Family Communication:  Disposition: Home with [place]   Status is: Inpatient  Remains inpatient appropriate because:IV treatments appropriate due to intensity of illness or inability to take PO and Inpatient level of care appropriate due to severity of illness   Dispo:  Patient From: Home  Planned Disposition: Home with Health Care Svc  Expected discharge date: 08/10/2020  Medically stable for discharge: No   Consultants:   n/a  Procedures:   N/a   Antimicrobials:    Subjective: Pt reports swollen legs not much improved but he is urinating on lasix IV, he was not symptomatic with the low HR  Objective: Vitals:   08/07/20 1403 08/07/20 2011 08/07/20 2134 08/08/20 0500  BP: 105/69 116/66  (!) 138/98  Pulse: (!) 51 (!) 58  (!) 59  Resp: 19 20  20   Temp: 97.7 F (36.5 C) 98.2 F (36.8 C)  98.2 F (36.8 C)  TempSrc: Oral Oral  Oral  SpO2:  100% 97% 100%  Weight:      Height:        Intake/Output Summary (Last 24 hours) at 08/08/2020 1145 Last data filed at 08/08/2020 1100 Gross per 24 hour  Intake -  Output 3350 ml  Net -3350 ml   Filed Weights   08/04/20 2156 08/06/20 0457 08/07/20 0500  Weight: (!) 145.6 kg (!) 149.3 kg (!) 149 kg    Examination:  General exam: sitting up in chair, NAD, Appears calm and comfortable  Respiratory system: Clear to auscultation. Respiratory effort normal. Cardiovascular system: normal S1 & S2 heard. No JVD, murmurs, rubs, gallops or clicks. 2+ pitting edema BLEs Gastrointestinal system: Abdomen is nondistended, soft and nontender. No organomegaly or masses  felt. Normal bowel sounds heard. Central nervous system: Alert and oriented. No focal neurological deficits. Extremities: 2+ edema BLEs, Symmetric 5 x 5 power. Skin: No rashes, lesions or ulcers Psychiatry: Judgement and insight appear poor. Mood & affect appropriate.   Data Reviewed: I have personally reviewed following labs and imaging studies  CBC: Recent Labs  Lab 08/04/20 1451  WBC 5.9  NEUTROABS 4.2  HGB 15.0  HCT 48.3  MCV 95.1  PLT 122*    Basic Metabolic Panel: Recent Labs  Lab 08/04/20 1451 08/05/20 0437 08/06/20 0423 08/07/20 0423 08/08/20 0419  NA 137 141 141 140 140  K 4.3 4.2 4.3 3.9 3.5  CL 102 103 103 102 101  CO2 28 27 29 29 27   GLUCOSE 63* 104* 72 104* 89  BUN 35* 35* 44* 50* 53*  CREATININE 2.60* 2.46* 2.81* 2.80* 2.71*  CALCIUM 8.6* 8.6* 8.4* 8.3* 8.2*  MG  --  2.2  --   --   --     GFR: Estimated Creatinine Clearance: 36.9 mL/min (A) (by C-G formula based on SCr of 2.71 mg/dL (H)).  Liver Function Tests: Recent Labs  Lab 08/04/20 1451  AST 13*  ALT 11  ALKPHOS 77  BILITOT 1.1  PROT 7.1  ALBUMIN 4.0    CBG: Recent Labs  Lab 08/07/20 1117 08/07/20 1714 08/07/20 2025 08/08/20 0728 08/08/20 1128  GLUCAP 138* 90 94 118* 134*    Recent Results (from the past 240 hour(s))  SARS CORONAVIRUS 2 (TAT 6-24 HRS) Nasopharyngeal Nasopharyngeal Swab     Status: None   Collection Time: 08/04/20  6:57 PM   Specimen: Nasopharyngeal Swab  Result Value Ref Range Status   SARS Coronavirus 2 NEGATIVE NEGATIVE Final    Comment: (NOTE) SARS-CoV-2 target nucleic acids are NOT DETECTED.  The SARS-CoV-2 RNA is generally detectable in upper and lower respiratory specimens during the acute phase of infection. Negative results do not preclude SARS-CoV-2 infection, do not rule out co-infections with other pathogens, and should not be used as the sole basis for treatment or other patient management decisions. Negative results must be combined with  clinical observations, patient history, and epidemiological information. The expected result is Negative.  Fact Sheet for Patients: SugarRoll.be  Fact Sheet for Healthcare Providers: https://www.woods-mathews.com/  This test is not yet approved or cleared by the [place] FDA and  has been authorized for detection and/or diagnosis of SARS-CoV-2 by FDA under an Emergency Use Authorization (EUA). This EUA will remain  in effect (meaning this test can be used) for the duration of the COVID-19 declaration under Se ction 564(b)(1) of the Act, 21 U.S.C. section 360bbb-3(b)(1), unless the authorization is terminated or revoked sooner.  Performed at [facility], [place] [address] [place], [place] [address]      Radiology Studies: No results found.   Scheduled Meds: . amLODipine  10 mg Oral Daily  . apixaban  5 mg Oral BID  . dextromethorphan-guaiFENesin  1 tablet Oral BID  . furosemide  80 mg Intravenous BID  . insulin aspart  0-15 Units Subcutaneous TID WC  . insulin  aspart  0-5 Units Subcutaneous QHS  . insulin detemir  20 Units Subcutaneous QHS  . isosorbide mononitrate  30 mg Oral Daily  . levothyroxine  300 mcg Oral Q0600  . loratadine  10 mg Oral q1800  . mometasone-formoterol  2 puff Inhalation BID  . pantoprazole (PROTONIX) IV  40 mg Intravenous Daily  . potassium chloride  20 mEq Oral Daily  . simvastatin  5 mg Oral Daily  . sodium chloride flush  3 mL Intravenous Q12H   Continuous Infusions: . sodium chloride       LOS: 4 days   Time spent: 36 mins   Delylah Stanczyk Wynetta Emery, MD How to contact the [facility] Attending or Consulting provider [place] or covering provider during after hours [place], for this patient?  1. Check the care team in [facility] and look for a) attending/consulting TRH provider listed and b) the [facility] team listed 2. Log into [email or web address] and use [place]'s universal password to access. If you do not have the  password, please contact the hospital operator. 3. Locate the [facility] provider you are looking for under [facility] and page to a number that you can be directly reached. 4. If you still have difficulty reaching the provider, please page the [facility] (Director on Call) for the Hospitalists listed on amion for assistance.  08/08/2020, 11:45 AM

## 2020-08-08 NOTE — Progress Notes (Signed)
Patient refuses CPAP 

## 2020-08-08 NOTE — Progress Notes (Signed)
Noted hr dropping into 20's. Notified Hospitalist. Patient asymptomatic, otherwise rest of VSS. EKG obtained per orders. Continue to monitor patient.

## 2020-08-08 NOTE — Consult Note (Signed)
Cardiology Consultation:   Patient ID: [name] [name]; [id number]; 02/24/1946   Admit date: 08/04/2020 Date of Consult: 08/08/2020  Primary Care Provider: [name] Primary Cardiologist: Rozann Lesches, MD Primary Electrophysiologist: None   Patient Profile:   RACER QUAM is a 75 y.o. male with a history of CAD status post DES to the LAD, CKD stage IIIb, hypertension, hyperlipidemia, left bundle branch block, type 2 diabetes mellitus, OSA, recently documented atrial fibrillation, and relatively recent COVID-19 pneumonia who is being seen today for the evaluation of fluid overload with presumed acute on chronic diastolic heart failure at the request of Dr. Wynetta Emery.  History of Present Illness:   Mr. Ressler is currently admitted to the hospital reporting approximately 1 week history of worsening shortness of breath and leg swelling, sensation of abdominal bloating, no chest pain or palpitations.  He states that he has been taking his oral Lasix as directed, not certain about specific weight change.  Per chart he was listed at 308 pounds in mid December, now in the 320s.  He was recently started on Eliquis for stroke prophylaxis due to documentation of persistent atrial fibrillation.  CHA2DS2-VASc score is 4.  He had a recent echocardiogram in mid December 2021 demonstrating LVEF 50 to 55% with moderate diastolic dysfunction, normal RV contraction, no major valvular abnormalities.  Past Medical History:  Diagnosis Date  . Atrial fibrillation ([place])   . CAD (coronary artery disease)    DES to LAD June 2018  . CKD (chronic kidney disease) stage 3, GFR 30-59 ml/min (HCC)   . Essential hypertension   . History of pneumonia   . Hyperlipidemia   . Hypothyroidism   . LBBB (left bundle branch block)   . Morbid obesity ([place])   . Non Hodgkin's lymphoma ([place])    Status post XRT and chemotherapy  . NSTEMI (non-ST elevated myocardial infarction) [facility])    June 2018   . Peripheral neuropathy   . Pneumonia due to COVID-19 virus   . Sleep apnea   . Type 2 diabetes mellitus ([place])     Past Surgical History:  Procedure Laterality Date  . CHOLECYSTECTOMY  1992  . COLONOSCOPY N/A 09/03/2014   SLF:six colon polyps removed/small internal hemorrhoids  . CORONARY STENT INTERVENTION N/A 01/21/2017   Procedure: Coronary Stent Intervention;  Surgeon: Nelva Bush, MD;  Location: [place] [facility];  Service: Cardiovascular;  Laterality: N/A;  . ESOPHAGOGASTRODUODENOSCOPY N/A 09/03/2014   SLF: mild gastritis/few gastric polyps  . LEFT HEART CATH AND CORONARY ANGIOGRAPHY N/A 01/20/2017   Procedure: Left Heart Cath and Coronary Angiography;  Surgeon: Jettie Booze, MD;  Location: [place] [facility];  Service: Cardiovascular;  Laterality: N/A;  . TOTAL KNEE ARTHROPLASTY  11/10/2011   Procedure: TOTAL KNEE ARTHROPLASTY;  Surgeon: Mauri Pole, MD;  Location: [facility];  Service: Orthopedics;  Laterality: Right;  . TOTAL KNEE ARTHROPLASTY Left 05/24/2018   Procedure: LEFT TOTAL KNEE ARTHROPLASTY;  Surgeon: Melrose Nakayama, MD;  Location: [facility];  Service: Orthopedics;  Laterality: Left;     Inpatient Medications: Scheduled Meds: . amLODipine  10 mg Oral Daily  . apixaban  5 mg Oral BID  . dextromethorphan-guaiFENesin  1 tablet Oral BID  . furosemide  80 mg Intravenous BID  . insulin aspart  0-15 Units Subcutaneous TID WC  . insulin aspart  0-5 Units Subcutaneous QHS  . insulin detemir  20 Units Subcutaneous QHS  . isosorbide mononitrate  30 mg Oral Daily  .  levothyroxine  300 mcg Oral Q0600  . loratadine  10 mg Oral q1800  . mometasone-formoterol  2 puff Inhalation BID  . pantoprazole (PROTONIX) IV  40 mg Intravenous Daily  . simvastatin  5 mg Oral Daily  . sodium chloride flush  3 mL Intravenous Q12H   Continuous Infusions: . sodium chloride     PRN Meds: sodium chloride, acetaminophen, ipratropium-albuterol, nitroGLYCERIN, ondansetron (ZOFRAN) IV,  senna-docusate, sodium chloride flush  Allergies:   No Known Allergies  Social History:   Social History   Tobacco Use  . Smoking status: Former Smoker    Packs/day: 0.25    Years: 30.00    Pack years: 7.50    Types: Cigarettes    Quit date: 08/04/2007    Years since quitting: 13.0  . Smokeless tobacco: Never Used  Substance Use Topics  . Alcohol use: No    Alcohol/week: 0.0 standard drinks    Family History:   The patient's family history includes Cancer in his father, maternal uncle, mother, and paternal uncle. There is no history of Colon cancer.  ROS:  Please see the history of present illness.  All other ROS reviewed and negative.     Physical Exam/Data:   Vitals:   08/07/20 1403 08/07/20 2011 08/07/20 2134 08/08/20 0500  BP: 105/69 116/66  (!) 138/98  Pulse: (!) 51 (!) 58  (!) 59  Resp: 19 20  20   Temp: 97.7 F (36.5 C) 98.2 F (36.8 C)  98.2 F (36.8 C)  TempSrc: Oral Oral  Oral  SpO2:  100% 97% 100%  Weight:      Height:        Intake/Output Summary (Last 24 hours) at 08/08/2020 1000 Last data filed at 08/08/2020 0500 Gross per 24 hour  Intake --  Output 2750 ml  Net -2750 ml   Filed Weights   08/04/20 2156 08/06/20 0457 08/07/20 0500  Weight: (!) 145.6 kg (!) 149.3 kg (!) 149 kg   Body mass index is 43.34 kg/m.   Gen: Chronically ill-appearing male, seated in bedside chair. HEENT: Conjunctiva and lids normal, oropharynx clear. Neck: Supple, difficult to assess JVP, no carotid bruits. Lungs: Decreased breath sounds at the bases with a few scattered crackles, nonlabored breathing at rest. Cardiac: Irregularly irregular, no S3 or significant systolic murmur, no pericardial rub. Abdomen: Protuberant, nontender, bowel sounds present, no guarding or rebound. Extremities: 2-3+ leg edema. Skin: Warm and dry. Musculoskeletal: No kyphosis. Neuropsychiatric: Alert and oriented x3, affect grossly appropriate.  EKG:  An ECG dated 08/07/2020 was personally  reviewed today and demonstrated:  Slow atrial fibrillation in the 50s with left bundle branch block and intermittent PVCs.  Telemetry:  I personally reviewed telemetry which shows atrial fibrillation.  Relevant CV Studies:  Echocardiogram 07/20/2020: 1. Left ventricular ejection fraction, by estimation, is 50 to 55%. The  left ventricle has low normal function. Left ventricular endocardial  border not optimally defined to evaluate regional wall motion. Left  ventricular diastolic parameters are  consistent with Grade II diastolic dysfunction (pseudonormalization).  Elevated left atrial pressure.  2. Right ventricular systolic function is normal. The right ventricular  size is normal.  3. Left atrial size was mildly dilated.  4. The mitral valve is normal in structure. Trivial mitral valve  regurgitation. No evidence of mitral stenosis.  5. The aortic valve is tricuspid. There is mild calcification of the  aortic valve. There is mild thickening of the aortic valve. Aortic valve  regurgitation is not visualized. No  aortic stenosis is present.  6. The inferior vena cava is normal in size with greater than 50%  respiratory variability, suggesting right atrial pressure of 3 mmHg.   Laboratory Data:  Chemistry Recent Labs  Lab 08/06/20 0423 08/07/20 0423 08/08/20 0419  NA 141 140 140  K 4.3 3.9 3.5  CL 103 102 101  CO2 29 29 27   GLUCOSE 72 104* 89  BUN 44* 50* 53*  CREATININE 2.81* 2.80* 2.71*  CALCIUM 8.4* 8.3* 8.2*  GFRNONAA 23* 23* 24*  ANIONGAP 9 9 12     Recent Labs  Lab 08/04/20 1451  PROT 7.1  ALBUMIN 4.0  AST 13*  ALT 11  ALKPHOS 77  BILITOT 1.1   Hematology Recent Labs  Lab 08/04/20 1451  WBC 5.9  RBC 5.08  HGB 15.0  HCT 48.3  MCV 95.1  MCH 29.5  MCHC 31.1  RDW 16.6*  PLT 122*   Cardiac Enzymes Recent Labs  Lab 07/19/20 1720 07/20/20 0012 07/20/20 0428 08/04/20 1451 08/04/20 1651  TROPONINIHS 19* 21* 20* 17 17   BNP Recent Labs   Lab 08/04/20 1451  BNP 231.0*     Radiology/Studies:  DG Chest Port 1 View  Result Date: 08/04/2020 CLINICAL DATA:  Shortness of breath.  COVID-19 positive. EXAM: PORTABLE CHEST 1 VIEW COMPARISON:  July 19, 2020. FINDINGS: Stable cardiomediastinal silhouette. No pneumothorax or pleural effusion is noted. Stable hazy opacities are noted throughout both lungs concerning for atypical pneumonia. Bony thorax is unremarkable. IMPRESSION: Stable hazy opacities are noted throughout both lungs concerning for atypical pneumonia. Electronically Signed   By: Marijo Conception M.D.   On: 08/04/2020 13:43    Assessment and Plan:   1. Acute on chronic diastolic heart failure with fluid overload.  Not certain about true baseline weight, but could potentially be up at least 20 pounds.  Recent echocardiogram in mid December 2021 indicated LVEF 50 to 55% with moderate diastolic dysfunction and normal RV contraction.  He has been on Lasix 40 mg daily as an outpatient.  2. Persistent atrial fibrillation with CHA2DS2-VASc score of 4.  Recently started on Eliquis for stroke prophylaxis with discontinuation of aspirin.  Bradycardic on low-dose bisoprolol, currently held.  3. CAD status post DES to the LAD in 2018.  No active angina symptoms.  At present on Imdur and Zocor.  Not on aspirin with recent initiation of Eliquis.  4. CKD stage III-IV.  Current creatinine 2.71 with normal potassium.  5. Essential hypertension, on Norvasc and hydralazine.  Agree with plan for IV Lasix and further diuresis, supplement potassium as well.  Agree with stopping bisoprolol for now, follow heart rate control in atrial fibrillation.  Eliquis dose has been adjusted to 5 mg twice daily.  Continue Norvasc, Imdur, hydralazine, and Zocor.  Hold off on repeat echocardiogram unless he does not improve clinically with current measures.  Signed, Rozann Lesches, MD  08/08/2020 10:00 AM

## 2020-08-09 DIAGNOSIS — N184 Chronic kidney disease, stage 4 (severe): Secondary | ICD-10-CM | POA: Diagnosis not present

## 2020-08-09 DIAGNOSIS — I480 Paroxysmal atrial fibrillation: Secondary | ICD-10-CM | POA: Diagnosis not present

## 2020-08-09 DIAGNOSIS — I5033 Acute on chronic diastolic (congestive) heart failure: Secondary | ICD-10-CM | POA: Diagnosis not present

## 2020-08-09 DIAGNOSIS — J9621 Acute and chronic respiratory failure with hypoxia: Secondary | ICD-10-CM | POA: Diagnosis not present

## 2020-08-09 LAB — BASIC METABOLIC PANEL
Anion gap: 10 (ref 5–15)
BUN: 46 mg/dL — ABNORMAL HIGH (ref 8–23)
CO2: 29 mmol/L (ref 22–32)
Calcium: 8.1 mg/dL — ABNORMAL LOW (ref 8.9–10.3)
Chloride: 103 mmol/L (ref 98–111)
Creatinine, Ser: 2.42 mg/dL — ABNORMAL HIGH (ref 0.61–1.24)
GFR, Estimated: 28 mL/min — ABNORMAL LOW (ref >60)
Glucose, Bld: 74 mg/dL (ref 70–99)
Potassium: 3.4 mmol/L — ABNORMAL LOW (ref 3.5–5.1)
Sodium: 142 mmol/L (ref 135–145)

## 2020-08-09 LAB — GLUCOSE, CAPILLARY
Glucose-Capillary: 159 mg/dL — ABNORMAL HIGH (ref 70–99)
Glucose-Capillary: 144 mg/dL — ABNORMAL HIGH (ref 70–99)
Glucose-Capillary: 123 mg/dL — ABNORMAL HIGH (ref 70–99)
Glucose-Capillary: 150 mg/dL — ABNORMAL HIGH (ref 70–99)

## 2020-08-09 MED ORDER — SALINE SPRAY 0.65 % NA SOLN
1.0000 | NASAL | Status: DC | PRN
  Filled 2020-08-09: qty 44

## 2020-08-09 MED ORDER — POTASSIUM CHLORIDE CRYS ER 20 MEQ PO TBCR
20.0000 meq | EXTENDED_RELEASE_TABLET | Freq: Two times a day (BID) | ORAL | Status: DC
  Administered 2020-08-09 – 2020-08-10 (×3): 20 meq via ORAL
  Filled 2020-08-09 (×2): qty 1

## 2020-08-09 NOTE — Care Management Important Message (Signed)
Important Message  Patient Details  Name: [name] [name] MRN: [id number] Date of Birth: 1946-01-13   Medicare Important Message Given:  Yes     Tommy Medal 08/09/2020, 12:52 PM

## 2020-08-09 NOTE — Progress Notes (Signed)
Progress Note  Patient Name: [name] [name] Date of Encounter: 08/09/2020  Primary Cardiologist: Rozann Lesches, MD  Subjective   Feels better in terms of shortness of breath.  No chest pain or palpitations.  Stable appetite.  Inpatient Medications    Scheduled Meds: . amLODipine  10 mg Oral Daily  . apixaban  5 mg Oral BID  . dextromethorphan-guaiFENesin  1 tablet Oral BID  . furosemide  80 mg Intravenous BID  . influenza vaccine adjuvanted  0.5 mL Intramuscular Tomorrow-1000  . insulin aspart  0-15 Units Subcutaneous TID WC  . insulin aspart  0-5 Units Subcutaneous QHS  . insulin detemir  20 Units Subcutaneous QHS  . isosorbide mononitrate  30 mg Oral Daily  . levothyroxine  300 mcg Oral Q0600  . loratadine  10 mg Oral q1800  . mometasone-formoterol  2 puff Inhalation BID  . pantoprazole (PROTONIX) IV  40 mg Intravenous Daily  . potassium chloride  20 mEq Oral BID  . simvastatin  5 mg Oral Daily  . sodium chloride flush  3 mL Intravenous Q12H   Continuous Infusions: . sodium chloride     PRN Meds: sodium chloride, acetaminophen, ipratropium-albuterol, nitroGLYCERIN, ondansetron (ZOFRAN) IV, senna-docusate, sodium chloride flush   Vital Signs    Vitals:   08/08/20 1311 08/08/20 2119 08/08/20 2135 08/09/20 0507  BP: 118/74 128/60  (!) 151/93  Pulse: (!) 57 65  71  Resp: 20 19  18   Temp: 98 F (36.7 C) 97.7 F (36.5 C)  98.4 F (36.9 C)  TempSrc: Oral   Oral  SpO2: 100% 100% 97% 93%  Weight:    (!) 141.7 kg  Height:        Intake/Output Summary (Last 24 hours) at 08/09/2020 0857 Last data filed at 08/09/2020 0558 Gross per 24 hour  Intake 400 ml  Output 3000 ml  Net -2600 ml   Filed Weights   08/06/20 0457 08/07/20 0500 08/09/20 0507  Weight: (!) 149.3 kg (!) 149 kg (!) 141.7 kg    Telemetry    Atrial fibrillation.  Personally reviewed.  ECG    An ECG dated 08/07/2020 was personally reviewed today and demonstrated:  Slow atrial fibrillation with left  bundle branch block and PVCs.  Physical Exam   GEN: No acute distress.   Neck: No elevated JVP. Cardiac:  Irregularly irregular, no gallop.  Respiratory: Nonlabored at rest.  Scattered crackles at the bases. GI:  Protuberant, bowel sounds present. MS:  2+ lower leg edema; No deformity.  Labs    Chemistry Recent Labs  Lab 08/04/20 1451 08/05/20 0437 08/07/20 0423 08/08/20 0419 08/09/20 0447  NA 137   < > 140 140 142  K 4.3   < > 3.9 3.5 3.4*  CL 102   < > 102 101 103  CO2 28   < > 29 27 29   GLUCOSE 63*   < > 104* 89 74  BUN 35*   < > 50* 53* 46*  CREATININE 2.60*   < > 2.80* 2.71* 2.42*  CALCIUM 8.6*   < > 8.3* 8.2* 8.1*  PROT 7.1  --   --   --   --   ALBUMIN 4.0  --   --   --   --   AST 13*  --   --   --   --   ALT 11  --   --   --   --   ALKPHOS 77  --   --   --   --  BILITOT 1.1  --   --   --   --   GFRNONAA 25*   < > 23* 24* 28*  ANIONGAP 7   < > 9 12 10    < > = values in this interval not displayed.     Hematology Recent Labs  Lab 08/04/20 1451  WBC 5.9  RBC 5.08  HGB 15.0  HCT 48.3  MCV 95.1  MCH 29.5  MCHC 31.1  RDW 16.6*  PLT 122*    Cardiac Enzymes Recent Labs  Lab 07/19/20 1720 07/20/20 0012 07/20/20 0428 08/04/20 1451 08/04/20 1651  TROPONINIHS 19* 21* 20* 17 17    BNP Recent Labs  Lab 08/04/20 1451  BNP 231.0*     Radiology    No results found.  Cardiac Studies   Echocardiogram 07/20/2020: 1. Left ventricular ejection fraction, by estimation, is 50 to 55%. The  left ventricle has low normal function. Left ventricular endocardial  border not optimally defined to evaluate regional wall motion. Left  ventricular diastolic parameters are  consistent with Grade II diastolic dysfunction (pseudonormalization).  Elevated left atrial pressure.  2. Right ventricular systolic function is normal. The right ventricular  size is normal.  3. Left atrial size was mildly dilated.  4. The mitral valve is normal in structure.  Trivial mitral valve  regurgitation. No evidence of mitral stenosis.  5. The aortic valve is tricuspid. There is mild calcification of the  aortic valve. There is mild thickening of the aortic valve. Aortic valve  regurgitation is not visualized. No aortic stenosis is present.  6. The inferior vena cava is normal in size with greater than 50%  respiratory variability, suggesting right atrial pressure of 3 mmHg.   Patient Profile     75 y.o. male with a history of CAD status post DES to the LAD, CKD stage IIIb, hypertension, hyperlipidemia, left bundle branch block, type 2 diabetes mellitus, OSA, recently documented atrial fibrillation, and relatively recent COVID-19 pneumonia who is being seen for the evaluation of fluid overload with presumed acute on chronic diastolic heart failure.  Assessment & Plan    1. Acute on chronic diastolic heart failure, diuresing well on IV Lasix.  Approximately 2600 cc out more than in last 24 hours.  Creatinine coming down with diuresis.  2. Mild hypokalemia, potassium 3.4.  3. Persistent atrial fibrillation with CHA2DS2-VASc score of 4.  He remains on Eliquis for stroke prophylaxis.  Bisoprolol discontinued given bradycardia.  4. CKD stage III-IV.  Creatinine has come down to 2.42.  5. CAD status post DES to the LAD in 2018.  No active angina at this time.  He remains on Imdur and Zocor.  Not on aspirin given use of Eliquis.  Would continue IV Lasix, agree with increase in potassium supplementation.  Would aim to get weight under 310 pounds which should be closer to his baseline, particularly if renal function continues to tolerate.  Stay off bisoprolol for now.  Continue Norvasc, Eliquis, Imdur, and Zocor.  Would try and add back hydralazine at low-dose eventually, could start at 10 or 20 mg twice daily if blood pressure allows.  He will likely be able to go home over the weekend.  Consider discharge Lasix dose of 60 to 80 mg daily.  Would need to be seen  in 1 to 2 weeks for clinical follow-up thereafter.  Signed, Rozann Lesches, MD  08/09/2020, 8:57 AM

## 2020-08-09 NOTE — Progress Notes (Signed)
PROGRESS NOTE   [name] [name]  RWE:[id number] DOB: Nov 24, 1945 DOA: 08/04/2020 PCP: [name] Belt, DO   Chief Complaint  Patient presents with  . 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 On the early morning of 08/05/2020, the patient developed respiratory distress and required an additional dose of furosemide. He was placed on BiPAP initially. He has been weaned to high flow nasal cannula.  Assessment & Plan:   Active Problems:   Chronic kidney disease Stage IV   Morbid obesity (HCC)   Type 2 diabetes mellitus with stage 4 chronic kidney disease (HCC)   Hypertension   Sleep apnea   Hypothyroidism   Diabetic neuropathy (HCC)   CAD (coronary artery disease)   Acute on chronic respiratory failure with hypoxia (HCC)   Acute exacerbation of CHF (congestive heart failure) (HCC)   AF (paroxysmal atrial fibrillation) (HCC)   Acute respiratory failure with hypoxia (HCC)   Acute on chronic diastolic CHF (congestive heart failure) ([place])   1. Acute diastolic Heart failure - Pt remains very volume overloaded, continue IV lasix for now, unable to determine baseline weight, appreciate inpatient cardiology consult, weight coming down, goal weight is 310 or less.   2. Acute respiratory failure with hypoxia - symptoms improving, continue supplemental oxygen, wean as able.  3. Persistent atrial fibrillation  - rate is controlled, holding bisoprolol due to bradycardia, he is fully anticoagulated with apixaban.  Per cards, hold  the bisoprolol, follow up in outpatient setting.   4. Stage 4 CKD - continue to monitor in the setting of IV diuresis. Creatinine holding stable.  5. Type 2 diabetes mellitus - continue current mgmt.  6. CAD - stable, no CP symptoms.   7. Bradycardia - holding bisoprolol.   8. Essential hypertension - resumed home amlodipine and imdur.  9. Morbid obesity - needs intensive education and lifestyle changes.   DVT prophylaxis: apixaban Code Status: full  Family Communication: t/c Disposition: Home with [facility]   Status is: Inpatient  Remains inpatient appropriate because:IV treatments appropriate due to intensity of illness or inability to take PO and Inpatient level of care appropriate due to severity of illness  Dispo:  Patient From: Home  Planned Disposition: Home with Health Care Svc  Expected discharge date: 08/10/2020  Medically stable for discharge: No  Consultants:   n/a  Procedures:   N/a   Antimicrobials:    Subjective: Pt urinating frequently and sometimes not recorded according to patient, less shortness of breath  Objective: Vitals:   08/08/20 2119 08/08/20 2135 08/09/20 0507 08/09/20 0908  BP: 128/60  (!) 151/93   Pulse: 65  71   Resp: 19  18   Temp: 97.7 F (36.5 C)  98.4 F (36.9 C)   TempSrc:   Oral   SpO2: 100% 97% 93% 97%  Weight:   (!) 141.7 kg   Height:        Intake/Output Summary (Last 24 hours) at 08/09/2020 1111 Last data filed at 08/09/2020 0558 Gross per 24 hour  Intake 400 ml  Output 2400 ml  Net -2000 ml   Filed Weights   08/06/20 0457 08/07/20 0500 08/09/20 0507  Weight: (!) 149.3 kg (!) 149 kg (!) 141.7 kg    Examination:  General exam: sitting up in bed, NAD, Appears calm and comfortable  Respiratory system: Clear to auscultation. Respiratory effort normal. Cardiovascular system: normal S1 & S2 heard. No JVD, murmurs, rubs, gallops or clicks. 2+ pitting edema BLEs Gastrointestinal system: Abdomen is nondistended, soft and  nontender. No organomegaly or masses felt. Normal bowel sounds heard. Central nervous system: Alert and oriented. No focal neurological deficits. Extremities: 1+ edema BLEs, Symmetric 5 x 5 power. Skin: No rashes, lesions or ulcers Psychiatry: Judgement and insight appear poor. Mood & affect appropriate.   Data Reviewed: I have personally reviewed following labs and imaging studies  CBC: Recent Labs  Lab 08/04/20 1451  WBC 5.9  NEUTROABS 4.2  HGB 15.0  HCT 48.3  MCV 95.1  PLT 122*    Basic Metabolic Panel: Recent Labs  Lab 08/05/20 0437 08/06/20 0423 08/07/20 0423 08/08/20 0419 08/09/20 0447  NA 141 141 140 140 142  K 4.2 4.3 3.9 3.5 3.4*  CL 103 103 102 101 103  CO2 27 29 29 27 29   GLUCOSE 104* 72 104* 89 74  BUN 35* 44* 50* 53* 46*  CREATININE 2.46* 2.81* 2.80* 2.71* 2.42*  CALCIUM 8.6* 8.4* 8.3* 8.2* 8.1*  MG 2.2  --   --   --   --     GFR: Estimated Creatinine Clearance: 40.2 mL/min (A) (by C-G formula based on SCr of 2.42 mg/dL (H)).  Liver Function Tests: Recent Labs  Lab 08/04/20 1451  AST 13*  ALT 11  ALKPHOS 77  BILITOT 1.1  PROT 7.1  ALBUMIN 4.0    CBG: Recent Labs  Lab 08/08/20 0728 08/08/20 1128 08/08/20 1645 08/08/20 2103 08/09/20 0734  GLUCAP 118* 134* 113* 174* 159*    Recent Results (from the past 240 hour(s))  SARS CORONAVIRUS 2 (TAT 6-24 HRS) Nasopharyngeal Nasopharyngeal Swab     Status: None   Collection Time: 08/04/20  6:57 PM   Specimen: Nasopharyngeal Swab  Result Value Ref Range Status   SARS Coronavirus 2 NEGATIVE NEGATIVE Final    Comment: (NOTE) SARS-CoV-2 target nucleic acids are NOT DETECTED.  The SARS-CoV-2 RNA is generally detectable in upper and lower respiratory specimens during the acute phase of infection. Negative results do not preclude SARS-CoV-2 infection, do not rule out co-infections with other pathogens, and should not be used as the sole basis for treatment or other patient management  decisions. Negative results must be combined with clinical observations, patient history, and epidemiological information. The expected result is Negative.  Fact Sheet for Patients: SugarRoll.be  Fact Sheet for Healthcare Providers: https://www.woods-mathews.com/  This test is not yet approved or cleared by the [place] FDA and  has been authorized for detection and/or diagnosis of SARS-CoV-2 by FDA under an Emergency Use Authorization (EUA). This EUA will remain  in effect (meaning this test can be used) for the duration of the COVID-19 declaration under Se ction 564(b)(1) of the Act, 21 U.S.C. section 360bbb-3(b)(1), unless the authorization is terminated or revoked sooner.  Performed at [facility], [place] [address], [place], [place] [address]      Radiology Studies: No results found.  Scheduled Meds: . amLODipine  10 mg Oral Daily  . apixaban  5 mg Oral BID  . dextromethorphan-guaiFENesin  1 tablet Oral BID  . furosemide  80 mg Intravenous BID  . insulin aspart  0-15 Units Subcutaneous TID WC  . insulin aspart  0-5 Units Subcutaneous QHS  . insulin detemir  20 Units Subcutaneous QHS  . isosorbide mononitrate  30 mg Oral Daily  . levothyroxine  300 mcg Oral Q0600  . loratadine  10 mg Oral q1800  . mometasone-formoterol  2 puff Inhalation BID  . pantoprazole (PROTONIX) IV  40 mg Intravenous Daily  . potassium chloride  20 mEq Oral BID  . simvastatin  5 mg Oral Daily  . sodium chloride flush  3 mL Intravenous Q12H   Continuous Infusions: . sodium chloride       LOS: 5 days   Time spent: 35 mins   Jeevan Kalla Wynetta Emery, MD How to contact the [facility] Attending or Consulting provider [place] or covering provider during after hours [place], for this patient?  1. Check the care team in [facility] and look for a) attending/consulting [facility] provider listed and b) the [facility] team listed 2. Log into [email or web address] and use [place]'s  universal password to access. If you do not have the password, please contact the hospital operator. 3. Locate the [facility] provider you are looking for under [facility] and page to a number that you can be directly reached. 4. If you still have difficulty reaching the provider, please page the [facility] (Director on Call) for the Hospitalists listed on amion for assistance.  08/09/2020, 11:11 AM

## 2020-08-09 NOTE — TOC Progression Note (Signed)
Transition of Care [facility]) - Progression Note    Patient Details  Name: HERMES WAFER MRN: [id number] Date of Birth: 1945/10/09  Transition of Care [facility]) CM/SW Contact  Shade Flood, LCSW Phone Number: 08/09/2020, 10:53 AM  Clinical Narrative:     TOC following. MD anticipating pt will be stable for dc home with resumption of [facility] services tomorrow. Updated Tim at [facility] via [employer]. Scheduled pt's hospital follow up appointment and added to AVS.  Weekend TOC will be available to further assist with any dc needs.  Expected Discharge Plan: [place] Barriers to Discharge: Continued Medical Work up  Expected Discharge Plan and Services Expected Discharge Plan: [place] Choice: NA Living arrangements for the past 2 months: Single Family Home                 DME Arranged: N/A DME Agency: NA       HH Arranged: PT HH Agency: [facility] (formerly [employer])     Representative spoke with at [place]: Will notify Tim upon d/c   Social Determinants of Health ([place]) Interventions    Readmission Risk Interventions Readmission Risk Prevention Plan 08/05/2020 06/26/2020  Transportation Screening Complete Complete  Medication Review Press photographer) Complete Complete  PCP or Specialist appointment within 3-5 days of discharge - Complete  HRI or [place] Complete Complete  SW Recovery Care/Counseling Consult Complete Complete  Palliative Care Screening Not Applicable Not Walker Not Applicable Complete  Some recent data might be hidden

## 2020-08-10 ENCOUNTER — Encounter (HOSPITAL_COMMUNITY): Payer: Self-pay | Admitting: Family Medicine

## 2020-08-10 DIAGNOSIS — I480 Paroxysmal atrial fibrillation: Secondary | ICD-10-CM | POA: Diagnosis not present

## 2020-08-10 DIAGNOSIS — J9621 Acute and chronic respiratory failure with hypoxia: Secondary | ICD-10-CM | POA: Diagnosis not present

## 2020-08-10 DIAGNOSIS — Z87891 Personal history of nicotine dependence: Secondary | ICD-10-CM

## 2020-08-10 DIAGNOSIS — J449 Chronic obstructive pulmonary disease, unspecified: Secondary | ICD-10-CM | POA: Diagnosis present

## 2020-08-10 DIAGNOSIS — N184 Chronic kidney disease, stage 4 (severe): Secondary | ICD-10-CM | POA: Diagnosis not present

## 2020-08-10 DIAGNOSIS — I5033 Acute on chronic diastolic (congestive) heart failure: Secondary | ICD-10-CM | POA: Diagnosis not present

## 2020-08-10 LAB — BASIC METABOLIC PANEL
Anion gap: 8 (ref 5–15)
BUN: 40 mg/dL — ABNORMAL HIGH (ref 8–23)
CO2: 33 mmol/L — ABNORMAL HIGH (ref 22–32)
Calcium: 8.3 mg/dL — ABNORMAL LOW (ref 8.9–10.3)
Chloride: 101 mmol/L (ref 98–111)
Creatinine, Ser: 2.37 mg/dL — ABNORMAL HIGH (ref 0.61–1.24)
GFR, Estimated: 28 mL/min — ABNORMAL LOW (ref >60)
Glucose, Bld: 104 mg/dL — ABNORMAL HIGH (ref 70–99)
Potassium: 3.6 mmol/L (ref 3.5–5.1)
Sodium: 142 mmol/L (ref 135–145)

## 2020-08-10 LAB — GLUCOSE, CAPILLARY
Glucose-Capillary: 138 mg/dL — ABNORMAL HIGH (ref 70–99)
Glucose-Capillary: 110 mg/dL — ABNORMAL HIGH (ref 70–99)

## 2020-08-10 LAB — MAGNESIUM: Magnesium: 2.1 mg/dL (ref 1.7–2.4)

## 2020-08-10 MED ORDER — FLUTICASONE PROPIONATE 50 MCG/ACT NA SUSP: 2.0000 | NASAL | Status: DC | PRN

## 2020-08-10 MED ORDER — POTASSIUM CHLORIDE CRYS ER 20 MEQ PO TBCR: 20.0000 meq | EXTENDED_RELEASE_TABLET | Freq: Two times a day (BID) | ORAL | 1 refills | Status: DC

## 2020-08-10 MED ORDER — FUROSEMIDE 80 MG PO TABS: 80.0000 mg | ORAL_TABLET | Freq: Every day | ORAL | 1 refills | Status: DC

## 2020-08-10 MED ORDER — TRESIBA FLEXTOUCH 100 UNIT/ML ~~LOC~~ SOPN: 20.0000 [IU] | PEN_INJECTOR | Freq: Every day | SUBCUTANEOUS | Status: DC

## 2020-08-10 NOTE — Discharge Summary (Addendum)
Physician Discharge Summary  LUKE FALERO UEA:[id number] DOB: 11-07-1945 DOA: 08/04/2020  PCP: [name], DO  Admit date: 08/04/2020 Discharge date: 08/10/2020  Admitted From:  Home  Disposition: Home with Home Health   Recommendations for Outpatient Follow-up:  1. Follow up with PCP in 1 weeks 2. Follow up with cardiology in 2 weeks 3. Please check renal function in 1-2 weeks 4. Ambulatory referral to [facility] made  Home Health: [facility] Orders PT, RN  Discharge Condition: STABLE   CODE STATUS: FULL   DIET: Heart Healthy/Carb Modified  Brief Hospitalization Summary: Please see all hospital notes, images, labs for full details of the hospitalization. ADMISSION HPI: [name] [name] is a 75 y.o. male with a history of CAD s/p PCI 2018, LBBB, chronic HFpEF, stage IV CKD, HTN, HLD, T2DM with neuropathy, OSA, obesity (BMI 40), marginal zone lymphoma s/p chemoradiation, and recent admission for covid-19 pneumonia, on home oxygen 3L who presented to the ED with worsening dyspnea, leg swelling. He reports slight increase in cough which is mild, intermittent, nonproductive, denies fever, chills, sick contacts. Dyspnea has been constant, worsening, moderate-severe. Has never really felt his breathing improve since covid diagnosis. +Orthopnea though this is chronic, maybe a bit worse lately. No chest pain, palpitations.   He reports stopping lasix yesterday on the advice of his cardiologist, though he believes he was supposed to transition from this to eliquis, so this remains unclear.  ED Course: Hypoxic with bilateral CXR infiltrates, afebrile. Leg swelling noted. Lasix and antibiotics given, hospitalists consulted.    Hospital Course   1. Acute diastolic Heart failure - Pt presented volume overloaded and has diuresed well on IV lasix 80 mg every 12 hours, renal function stable to improved, he feels ready to go home with weight below 310 pounds. Appreciate inpatient cardiology  consult.  He will discharge home on lasix 80 mg daily and potassium supplement 20 meq twice daily.  Close outpatient follow up with cardiology scheduled for 09/02/20 at [facility] office.    2. Acute respiratory failure with hypoxia - symptoms improved, continue supplemental oxygen, weaned to home oxygen requirement.  3. Persistent atrial fibrillation  - rate is controlled with no agents, he had severe bradycardia and we are holding bisoprolol due to bradycardia, he is anticoagulated with apixaban.  Per cards, hold the bisoprolol, follow up in outpatient setting.   4. Stage 4 CKD - monitored closely in the setting of IV diuresis but creatinine stable to improved.  5. Type 2 diabetes mellitus - continue current mgmt.  6. CAD - stable, no CP symptoms.   7. Bradycardia - holding bisoprolol with resolution in symptoms.   8. Essential hypertension - resumed home amlodipine and imdur.  9. Morbid obesity - needs intensive education and lifestyle changes.   DVT prophylaxis: apixaban Code Status: full  Family Communication: t/c Disposition: [facility] with [facility]   Discharge Diagnoses:  Active Problems:   Chronic kidney disease Stage IV   Morbid obesity (HCC)   Type 2 diabetes mellitus with stage 4 chronic kidney disease (HCC)   Hypertension   Sleep apnea   Hypothyroidism   Diabetic neuropathy (HCC)   CAD (coronary artery disease)   Acute on chronic respiratory failure with hypoxia (HCC)   Acute exacerbation of CHF (congestive heart failure) (HCC)   AF (paroxysmal atrial fibrillation) (HCC)   Acute respiratory failure with hypoxia (HCC)   Acute on chronic diastolic CHF (congestive heart failure) ([place])   Former smoker   COPD (chronic  obstructive pulmonary disease) [facility])  Discharge Instructions: Discharge Instructions    Ambulatory referral to Pulmonology   Complete by: As directed    Reason for referral: Asthma/COPD     Allergies as of 08/10/2020   No Known Allergies     Medication List    STOP  taking these medications   bisoprolol 5 MG tablet Commonly known as: ZEBETA   hydrALAZINE 25 MG tablet Commonly known as: APRESOLINE     TAKE these medications   acetaminophen 325 MG tablet Commonly known as: TYLENOL Take 2 tablets (650 mg total) by mouth every 6 (six) hours as needed for mild pain (or Fever >/= 101).   albuterol 108 (90 Base) MCG/ACT inhaler Commonly known as: VENTOLIN HFA Inhale 1-2 puffs into the lungs every 6 (six) hours as needed for wheezing or shortness of breath.   amLODipine 10 MG tablet Commonly known as: NORVASC Take 10 mg by mouth daily.   apixaban 2.5 MG Tabs tablet Commonly known as: Eliquis Take 1 tablet (2.5 mg total) by mouth 2 (two) times daily.   fluticasone 50 MCG/ACT nasal spray Commonly known as: FLONASE Place 2 sprays into both nostrils as needed for up to 14 days.   furosemide 80 MG tablet Commonly known as: LASIX Take 1 tablet (80 mg total) by mouth daily. Start taking on: August 11, 2020 What changed:   medication strength  how much to take   ipratropium-albuterol 0.5-2.5 (3) MG/3ML Soln Commonly known as: DUONEB Inhale 3 mLs into the lungs every 4 (four) hours as needed.   isosorbide mononitrate 30 MG 24 hr tablet Commonly known as: IMDUR Take 1 tablet (30 mg total) by mouth daily.   levocetirizine 5 MG tablet Commonly known as: XYZAL Take 5 mg by mouth every evening.   levothyroxine 200 MCG tablet Commonly known as: SYNTHROID Take 300 mcg by mouth daily before breakfast. (takes with 145mcg for a total of 346mcg)   levothyroxine 100 MCG tablet Commonly known as: SYNTHROID Take 100 mcg by mouth daily. (takes with 256mcg for a total of 337mcg)   nitroGLYCERIN 0.4 MG SL tablet Commonly known as: NITROSTAT Place 0.4 mg under the tongue every 5 (five) minutes as needed for chest pain.   OXYGEN Inhale 3 L into the lungs daily as needed.   potassium chloride SA 20 MEQ tablet Commonly known as: KLOR-CON Take 1  tablet (20 mEq total) by mouth 2 (two) times daily.   senna-docusate 8.6-50 MG tablet Commonly known as: Senokot-S Take 1 tablet by mouth daily as needed.   simvastatin 5 MG tablet Commonly known as: ZOCOR Take 5 mg by mouth daily.   Symbicort 160-4.5 MCG/ACT inhaler Generic drug: budesonide-formoterol Inhale 2 puffs into the lungs daily.   Tyler Aas FlexTouch 100 UNIT/ML FlexTouch Pen Generic drug: insulin degludec Inject 20 Units into the skin daily at 10 pm. What changed: how much to take       Follow-up Information    Lazoff, Shawn P, DO. Go on 08/12/2020.   Specialty: Family Medicine Why: Please arrive by 9:15 for a hospital follow up appointment Contact information: [address] [place] [address] [phone]        Verta Ellen., NP Follow up on 09/02/2020.   Specialty: Cardiology Why: as scheduled Contact information: [place] [address] [phone]              No Known Allergies Allergies as of 08/10/2020   No  Known Allergies     Medication List    STOP taking these medications   bisoprolol 5 MG tablet Commonly known as: ZEBETA   hydrALAZINE 25 MG tablet Commonly known as: APRESOLINE     TAKE these medications   acetaminophen 325 MG tablet Commonly known as: TYLENOL Take 2 tablets (650 mg total) by mouth every 6 (six) hours as needed for mild pain (or Fever >/= 101).   albuterol 108 (90 Base) MCG/ACT inhaler Commonly known as: VENTOLIN HFA Inhale 1-2 puffs into the lungs every 6 (six) hours as needed for wheezing or shortness of breath.   amLODipine 10 MG tablet Commonly known as: NORVASC Take 10 mg by mouth daily.   apixaban 2.5 MG Tabs tablet Commonly known as: Eliquis Take 1 tablet (2.5 mg total) by mouth 2 (two) times daily.   fluticasone 50 MCG/ACT nasal spray Commonly known as: FLONASE Place 2 sprays into both nostrils as needed for up to 14 days.   furosemide 80 MG tablet Commonly  known as: LASIX Take 1 tablet (80 mg total) by mouth daily. Start taking on: August 11, 2020 What changed:   medication strength  how much to take   ipratropium-albuterol 0.5-2.5 (3) MG/3ML Soln Commonly known as: DUONEB Inhale 3 mLs into the lungs every 4 (four) hours as needed.   isosorbide mononitrate 30 MG 24 hr tablet Commonly known as: IMDUR Take 1 tablet (30 mg total) by mouth daily.   levocetirizine 5 MG tablet Commonly known as: XYZAL Take 5 mg by mouth every evening.   levothyroxine 200 MCG tablet Commonly known as: SYNTHROID Take 300 mcg by mouth daily before breakfast. (takes with 147mcg for a total of 332mcg)   levothyroxine 100 MCG tablet Commonly known as: SYNTHROID Take 100 mcg by mouth daily. (takes with 255mcg for a total of 352mcg)   nitroGLYCERIN 0.4 MG SL tablet Commonly known as: NITROSTAT Place 0.4 mg under the tongue every 5 (five) minutes as needed for chest pain.   OXYGEN Inhale 3 L into the lungs daily as needed.   potassium chloride SA 20 MEQ tablet Commonly known as: KLOR-CON Take 1 tablet (20 mEq total) by mouth 2 (two) times daily.   senna-docusate 8.6-50 MG tablet Commonly known as: Senokot-S Take 1 tablet by mouth daily as needed.   simvastatin 5 MG tablet Commonly known as: ZOCOR Take 5 mg by mouth daily.   Symbicort 160-4.5 MCG/ACT inhaler Generic drug: budesonide-formoterol Inhale 2 puffs into the lungs daily.   Tyler Aas FlexTouch 100 UNIT/ML FlexTouch Pen Generic drug: insulin degludec Inject 20 Units into the skin daily at 10 pm. What changed: how much to take       Procedures/Studies: DG Chest 2 View  Result Date: 07/19/2020 CLINICAL DATA:  Increasing shortness of breath.  COVID 06/24/2020 EXAM: CHEST - 2 VIEW COMPARISON:  Chest x-ray 06/24/2020, CT chest 08/01/2019 FINDINGS: The heart size and mediastinal contours are within normal limits. Interval development of multifocal hazy airspace opacities. No pulmonary  edema. Interval development of bilateral trace pleural effusions. No pneumothorax. No acute osseous abnormality. IMPRESSION: Multifocal pneumonia with bilateral trace pleural effusions. Followup PA and lateral chest X-ray is recommended in 3-4 weeks following therapy to ensure resolution and exclude underlying malignancy. Electronically Signed   By: Iven Finn M.D.   On: 07/19/2020 18:07   DG Chest Port 1 View  Result Date: 08/04/2020 CLINICAL DATA:  Shortness of breath.  COVID-19 positive. EXAM: PORTABLE CHEST 1 VIEW COMPARISON:  July 19, 2020. FINDINGS: Stable cardiomediastinal silhouette. No pneumothorax or pleural effusion is noted. Stable hazy opacities are noted throughout both lungs concerning for atypical pneumonia. Bony thorax is unremarkable. IMPRESSION: Stable hazy opacities are noted throughout both lungs concerning for atypical pneumonia. Electronically Signed   By: Marijo Conception M.D.   On: 08/04/2020 13:43   ECHOCARDIOGRAM COMPLETE  Result Date: 07/20/2020    ECHOCARDIOGRAM REPORT   Patient Name:   [name] [name] Date of Exam: 07/20/2020 Medical Rec #:  [id number]     Height:       73.0 in Accession #:    [id number]    Weight:       308.1 lb Date of Birth:  [birth year]     BSA:          2.585 m Patient Age:    72 years      BP:           134/76 mmHg Patient Gender: M             HR:           58 bpm. Exam Location:  [facility] Procedure: 2D Echo Indications:    CHF-Acute Systolic Y19.50  History:        Patient has prior history of Echocardiogram examinations, most                 recent 11/17/2019. Previous Myocardial Infarction and CAD,                 Signs/Symptoms:Fever; Risk Factors:Diabetes, Current Smoker and                 Hypertension. Elevated troponin.  Sonographer:    Leavy Cella RDCS (AE) Referring Phys: [id number] OLADAPO ADEFESO IMPRESSIONS  1. Left ventricular ejection fraction, by estimation, is 50 to 55%. The left ventricle has low normal function. Left ventricular  endocardial border not optimally defined to evaluate regional wall motion. Left ventricular diastolic parameters are consistent with Grade II diastolic dysfunction (pseudonormalization). Elevated left atrial pressure.  2. Right ventricular systolic function is normal. The right ventricular size is normal.  3. Left atrial size was mildly dilated.  4. The mitral valve is normal in structure. Trivial mitral valve regurgitation. No evidence of mitral stenosis.  5. The aortic valve is tricuspid. There is mild calcification of the aortic valve. There is mild thickening of the aortic valve. Aortic valve regurgitation is not visualized. No aortic stenosis is present.  6. The inferior vena cava is normal in size with greater than 50% respiratory variability, suggesting right atrial pressure of 3 mmHg. FINDINGS  Left Ventricle: Left ventricular ejection fraction, by estimation, is 50 to 55%. The left ventricle has low normal function. Left ventricular endocardial border not optimally defined to evaluate regional wall motion. The left ventricular internal cavity  size was normal in size. There is no left ventricular hypertrophy. Left ventricular diastolic parameters are consistent with Grade II diastolic dysfunction (pseudonormalization). Elevated left atrial pressure. Right Ventricle: The right ventricular size is normal. No increase in right ventricular wall thickness. Right ventricular systolic function is normal. Left Atrium: Left atrial size was mildly dilated. Right Atrium: Right atrial size was normal in size. Pericardium: There is no evidence of pericardial effusion. Mitral Valve: The mitral valve is normal in structure. Trivial mitral valve regurgitation. No evidence of mitral valve stenosis. Tricuspid Valve: The tricuspid valve is normal in structure. Tricuspid valve regurgitation is trivial. No evidence of tricuspid stenosis. Aortic Valve:  The aortic valve is tricuspid. There is mild calcification of the aortic  valve. There is mild thickening of the aortic valve. There is mild aortic valve annular calcification. Aortic valve regurgitation is not visualized. Aortic regurgitation PHT measures 561 msec. No aortic stenosis is present. Aortic valve mean gradient measures 6.7 mmHg. Aortic valve peak gradient measures 13.2 mmHg. Aortic valve area, by VTI measures 1.55 cm. Pulmonic Valve: The pulmonic valve was not well visualized. Pulmonic valve regurgitation is not visualized. No evidence of pulmonic stenosis. Aorta: The aortic root is normal in size and structure. Pulmonary Artery: Indeterminant PASPl, inadequate TR jet. Venous: The inferior vena cava is normal in size with greater than 50% respiratory variability, suggesting right atrial pressure of 3 mmHg. IAS/Shunts: No atrial level shunt detected by color flow Doppler.  LEFT VENTRICLE PLAX 2D LVOT diam:     1.90 cm  Diastology LV SV:         59       LV e' medial:    4.03 cm/s LV SV Index:   23       LV E/e' medial:  27.5 LVOT Area:     2.84 cm LV e' lateral:   9.25 cm/s                         LV E/e' lateral: 12.0  RIGHT VENTRICLE RV S prime:     12.60 cm/s TAPSE (M-mode): 1.4 cm LEFT ATRIUM              Index       RIGHT ATRIUM           Index LA Vol (A2C):   101.0 ml 39.07 ml/m RA Area:     23.20 cm LA Vol (A4C):   63.6 ml  24.60 ml/m RA Volume:   89.90 ml  34.77 ml/m LA Biplane Vol: 83.0 ml  32.10 ml/m  AORTIC VALVE AV Area (Vmax):    1.56 cm AV Area (Vmean):   1.30 cm AV Area (VTI):     1.55 cm AV Vmax:           181.34 cm/s AV Vmean:          122.441 cm/s AV VTI:            0.379 m AV Peak Grad:      13.2 mmHg AV Mean Grad:      6.7 mmHg LVOT Vmax:         99.83 cm/s LVOT Vmean:        56.149 cm/s LVOT VTI:          0.208 m LVOT/AV VTI ratio: 0.55 AI PHT:            561 msec  AORTA Ao Root diam: 3.60 cm MITRAL VALVE MV Area (PHT): 4.08 cm     SHUNTS MV Decel Time: 186 msec     Systemic VTI:  0.21 m MV E velocity: 111.00 cm/s  Systemic Diam: 1.90 cm MV A  velocity: 34.30 cm/s MV E/A ratio:  3.24 Carlyle Dolly MD Electronically signed by Carlyle Dolly MD Signature Date/Time: 07/20/2020/2:58:49 PM    Final      Subjective: Pt says he feels much better today.  He has been ambulating better. SOB better, no CP.  He is urinating well.  Weight is down.   Discharge Exam: Vitals:   08/10/20 0503 08/10/20 1311  BP: 140/79 129/73  Pulse: 60 66  Resp:  19 19  Temp: 97.9 F (36.6 C) 98.1 F (36.7 C)  SpO2: 98% 97%   Vitals:   08/09/20 2021 08/09/20 2123 08/10/20 0503 08/10/20 1311  BP:  129/66 140/79 129/73  Pulse:  88 60 66  Resp:  19 19 19   Temp:  98.6 F (37 C) 97.9 F (36.6 C) 98.1 F (36.7 C)  TempSrc:  Oral Oral   SpO2: 93% 100% 98% 97%  Weight:   (!) 141 kg   Height:       General: Pt is alert, awake, not in acute distress Cardiovascular: normal S1/S2 +, no rubs, no gallops Respiratory: CTA bilaterally, no wheezing, no rhonchi Abdominal: Soft, NT, ND, bowel sounds + Extremities: no edema, no cyanosis   The results of significant diagnostics from this hospitalization (including imaging, microbiology, ancillary and laboratory) are listed below for reference.     Microbiology: Recent Results (from the past 240 hour(s))  SARS CORONAVIRUS 2 (TAT 6-24 HRS) Nasopharyngeal Nasopharyngeal Swab     Status: None   Collection Time: 08/04/20  6:57 PM   Specimen: Nasopharyngeal Swab  Result Value Ref Range Status   SARS Coronavirus 2 NEGATIVE NEGATIVE Final    Comment: (NOTE) SARS-CoV-2 target nucleic acids are NOT DETECTED.  The SARS-CoV-2 RNA is generally detectable in upper and lower respiratory specimens during the acute phase of infection. Negative results do not preclude SARS-CoV-2 infection, do not rule out co-infections with other pathogens, and should not be used as the sole basis for treatment or other patient management decisions. Negative results must be combined with clinical observations, patient history, and  epidemiological information. The expected result is Negative.  Fact Sheet for Patients: SugarRoll.be  Fact Sheet for Healthcare Providers: https://www.woods-mathews.com/  This test is not yet approved or cleared by the [place] FDA and  has been authorized for detection and/or diagnosis of SARS-CoV-2 by FDA under an Emergency Use Authorization (EUA). This EUA will remain  in effect (meaning this test can be used) for the duration of the COVID-19 declaration under Se ction 564(b)(1) of the Act, 21 U.S.C. section 360bbb-3(b)(1), unless the authorization is terminated or revoked sooner.  Performed at [facility], [place] [address], [place], [place] [address]      Labs: BNP (last 3 results) Recent Labs    06/24/20 0029 07/19/20 1720 08/04/20 1451  BNP 157.0* 182.0* 009.3*   Basic Metabolic Panel: Recent Labs  Lab 08/05/20 0437 08/06/20 0423 08/07/20 0423 08/08/20 0419 08/09/20 0447 08/10/20 0500  NA 141 141 140 140 142 142  K 4.2 4.3 3.9 3.5 3.4* 3.6  CL 103 103 102 101 103 101  CO2 27 29 29 27 29  33*  GLUCOSE 104* 72 104* 89 74 104*  BUN 35* 44* 50* 53* 46* 40*  CREATININE 2.46* 2.81* 2.80* 2.71* 2.42* 2.37*  CALCIUM 8.6* 8.4* 8.3* 8.2* 8.1* 8.3*  MG 2.2  --   --   --   --  2.1   Liver Function Tests: Recent Labs  Lab 08/04/20 1451  AST 13*  ALT 11  ALKPHOS 77  BILITOT 1.1  PROT 7.1  ALBUMIN 4.0   No results for input(s): LIPASE, AMYLASE in the last 168 hours. No results for input(s): AMMONIA in the last 168 hours. CBC: Recent Labs  Lab 08/04/20 1451  WBC 5.9  NEUTROABS 4.2  HGB 15.0  HCT 48.3  MCV 95.1  PLT 122*   Cardiac Enzymes: No results for input(s): CKTOTAL, CKMB, CKMBINDEX, TROPONINI in the  last 168 hours. BNP: Invalid input(s): POCBNP CBG: Recent Labs  Lab 08/09/20 1113 08/09/20 1648 08/09/20 2109 08/10/20 0752 08/10/20 1113  GLUCAP 144* 123* 150* 138* 110*   D-Dimer No  results for input(s): DDIMER in the last 72 hours. Hgb A1c No results for input(s): HGBA1C in the last 72 hours. Lipid Profile No results for input(s): CHOL, HDL, LDLCALC, TRIG, CHOLHDL, LDLDIRECT in the last 72 hours. Thyroid function studies Recent Labs    08/08/20 0419  TSH 7.776*   Anemia work up No results for input(s): VITAMINB12, FOLATE, FERRITIN, TIBC, IRON, RETICCTPCT in the last 72 hours. Urinalysis    Component Value Date/Time   COLORURINE STRAW (A) 07/19/2020 2255   APPEARANCEUR CLEAR 07/19/2020 2255   LABSPEC 1.005 07/19/2020 2255   PHURINE 7.0 07/19/2020 2255   GLUCOSEU NEGATIVE 07/19/2020 2255   HGBUR NEGATIVE 07/19/2020 2255   BILIRUBINUR NEGATIVE 07/19/2020 2255   KETONESUR NEGATIVE 07/19/2020 2255   PROTEINUR NEGATIVE 07/19/2020 2255   UROBILINOGEN 4.0 (H) 07/23/2014 1356   NITRITE NEGATIVE 07/19/2020 2255   LEUKOCYTESUR NEGATIVE 07/19/2020 2255   Sepsis Labs Invalid input(s): PROCALCITONIN,  WBC,  LACTICIDVEN Microbiology Recent Results (from the past 240 hour(s))  SARS CORONAVIRUS 2 (TAT 6-24 HRS) Nasopharyngeal Nasopharyngeal Swab     Status: None   Collection Time: 08/04/20  6:57 PM   Specimen: Nasopharyngeal Swab  Result Value Ref Range Status   SARS Coronavirus 2 NEGATIVE NEGATIVE Final    Comment: (NOTE) SARS-CoV-2 target nucleic acids are NOT DETECTED.  The SARS-CoV-2 RNA is generally detectable in upper and lower respiratory specimens during the acute phase of infection. Negative results do not preclude SARS-CoV-2 infection, do not rule out co-infections with other pathogens, and should not be used as the sole basis for treatment or other patient management decisions. Negative results must be combined with clinical observations, patient history, and epidemiological information. The expected result is Negative.  Fact Sheet for Patients: SugarRoll.be  Fact Sheet for Healthcare  Providers: https://www.woods-mathews.com/  This test is not yet approved or cleared by the [place] FDA and  has been authorized for detection and/or diagnosis of SARS-CoV-2 by FDA under an Emergency Use Authorization (EUA). This EUA will remain  in effect (meaning this test can be used) for the duration of the COVID-19 declaration under Se ction 564(b)(1) of the Act, 21 U.S.C. section 360bbb-3(b)(1), unless the authorization is terminated or revoked sooner.  Performed at [facility], [place] [address] [place], [place] [address]    Time coordinating discharge: 38 mins   SIGNED:  Irwin Brakeman, MD  [facility] 08/10/2020, 3:39 PM How to contact the [facility] Attending or Consulting provider [place] or covering provider during after hours [place], for this patient?  1. Check the care team in [facility] and look for a) attending/consulting TR[facility] provider listed and b) the [facility] team listed 2. Log into [email or web address] and use [place]'s universal password to access. If you do not have the password, please contact the hospital operator. 3. Locate the [facility] provider you are looking for under [facility] and page to a number that you can be directly reached. 4. If you still have difficulty reaching the provider, please page the [facility] (Director on Call) for the Hospitalists listed on amion for assistance.

## 2020-08-10 NOTE — TOC Transition Note (Signed)
Transition of Care [facility] - CM/SW Discharge Note   Patient Details  Name: [name] [name] MRN: [id number] Date of Birth: [birth year]  Transition of Care [place]) CM/SW Contact:  Natasha Bence, LCSW Phone Number: 08/10/2020, 12:20 PM   Clinical Narrative:    CSW notified of patient's readiness for discharge. CSW notified Tim with [facility] of patient's discharge. Tim agreeable to begin services upon discharge. TOC signing off.   Final next level of care: [place] Barriers to Discharge: Barriers Resolved   Patient Goals and CMS Choice Patient states their goals for this hospitalization and ongoing recovery are:: Return home with [place] CMS Medicare.gov Compare Post Acute Care list provided to:: Patient Choice offered to / list presented to : Patient  Discharge Placement                    Patient and family notified of of transfer: 08/10/20  Discharge Plan and Services     Post Acute Care Choice: NA          DME Arranged: N/A DME Agency: NA       HH Arranged: RN,PT [place] Agency: [facility] (formerly [employer]) Date [place]: 08/10/20 Time [place]: 1220 Representative spoke with at [place]: [place] ([place]) Interventions     Readmission Risk Interventions Readmission Risk Prevention Plan 08/05/2020 06/26/2020  Transportation Screening Complete Complete  Medication Review Press photographer) Complete Complete  PCP or Specialist appointment within 3-5 days of discharge - Complete  HRI or [place] Complete Complete  SW Recovery Care/Counseling Consult Complete Complete  Palliative Care Screening Not Applicable Not [place] Not Applicable Complete  Some recent data might be hidden

## 2020-08-10 NOTE — Discharge Instructions (Signed)
Heart Failure, Diagnosis  Heart failure is a condition in which the heart has trouble pumping blood because it has become weak or stiff. This means that the heart does not pump blood well enough for the body to stay healthy. For some people with heart failure, fluid may back up into the lungs. There may also be swelling (edema) in the lower legs. Heart failure is usually a long-term (chronic) condition. It is important for you to take good care of yourself and follow the treatment plan from your health care provider. What are the causes? This condition may be caused by:  High blood pressure (hypertension). Hypertension causes the heart muscle to work harder than normal. This makes the heart stiff or weak.  Coronary artery disease, or CAD. CAD is the buildup of cholesterol and fat (plaque) in the arteries of the heart.  Heart attack, also called myocardial infarction. This injures the heart muscle, making it hard for the heart to pump blood.  Abnormal heart valves. The valves do not open and close properly, forcing the heart to pump harder to keep the blood flowing.  Heart muscle disease (cardiomyopathy or myocarditis). This is damage to the heart muscle. It can increase the risk of heart failure.  Lung disease. The heart works harder when the lungs are not healthy.  Abnormal heart rhythms. These can lead to heart failure. What increases the risk? The risk of heart failure increases as a person ages. This condition is also more likely to develop in people who:  Are overweight.  Are male.  Smoke or chew tobacco.  Abuse alcohol or illegal drugs.  Have taken medicines that can damage the heart, such as chemotherapy drugs.  Have diabetes.  Have abnormal heart rhythms.  Have thyroid problems.  Have low blood counts (anemia). What are the signs or symptoms? Symptoms of this condition include:  Shortness of breath with activity, such as when climbing stairs.  A cough that does not  go away.  Swelling of the feet, ankles, legs, or abdomen.  Losing weight for no reason.  Trouble breathing when lying flat (orthopnea).  Waking from sleep because of the need to sit up and get more air.  Rapid heartbeat.  Tiredness (fatigue) and loss of energy.  Feeling light-headed, dizzy, or close to fainting.  Loss of appetite.  Nausea.  Waking up more often during the night to urinate (nocturia).  Confusion. How is this diagnosed? This condition is diagnosed based on:  Your medical history, symptoms, and a physical exam.  Diagnostic tests, which may include: ? Echocardiogram. ? Electrocardiogram (ECG). ? Chest X-ray. ? Blood tests. ? Exercise stress test. ? Radionuclide scans. ? Cardiac catheterization and angiogram. How is this treated? Treatment for this condition is aimed at managing the symptoms of heart failure. Medicines Treatment may include medicines that:  Help lower blood pressure by relaxing (dilating) the blood vessels. These medicines are called ACE inhibitors (angiotensin-converting enzyme) and ARBs (angiotensin receptor blockers).  Cause the kidneys to remove salt and water from the blood through urination (diuretics).  Improve heart muscle strength and prevent the heart from beating too fast (beta blockers).  Increase the force of the heartbeat (digoxin). Healthy behavior changes     Treatment may also include making healthy lifestyle changes, such as:  Reaching and staying at a healthy weight.  Quitting smoking or chewing tobacco.  Eating heart-healthy foods.  Limiting or avoiding alcohol.  Stopping the use of illegal drugs.  Being physically active.  Other treatments  Other treatments may include:  Procedures to open blocked arteries or repair damaged valves.  Placing a pacemaker to improve heart function (cardiac resynchronization therapy).  Placing a device to treat serious abnormal heart rhythms (implantable cardioverter  defibrillator, or ICD).  Placing a device to improve the pumping ability of the heart (left ventricular assist device, or LVAD).  Receiving a healthy heart from a donor (heart transplant). This is done when other treatments have not helped. Follow these instructions at home:  Manage other health conditions as told by your health care provider. These may include hypertension, diabetes, thyroid disease, or abnormal heart rhythms.  Get ongoing education and support as needed. Learn as much as you can about heart failure.  Keep all follow-up visits as told by your health care provider. This is important. Summary  Heart failure is a condition in which the heart has trouble pumping blood because it has become weak or stiff.  This condition is caused by high blood pressure and other diseases of the heart and lungs.  Symptoms of this condition include shortness of breath, tiredness (fatigue), nausea, and swelling of the feet, ankles, legs, or abdomen.  Treatments for this condition may include medicines, lifestyle changes, and surgery.  Manage other health conditions as told by your health care provider. This information is not intended to replace advice given to you by your health care provider. Make sure you discuss any questions you have with your health care provider. Document Revised: 10/07/2018 Document Reviewed: 10/07/2018 Elsevier Patient Education  [name].   Heart Failure, Self Care Heart failure is a serious condition. This sheet explains things you need to do to take care of yourself at home. To help you stay as healthy as possible, you may be asked to change your diet, take certain medicines, and make other changes in your life. Your doctor may also give you more specific instructions. If you have problems or questions, call your doctor. What are the risks? Having heart failure makes it more likely for you to have some problems. These problems can get worse if you do  not take good care of yourself. Problems may include:  Blood clotting problems. This may cause a stroke.  Damage to the kidneys, liver, or lungs.  Abnormal heart rhythms. Supplies needed:  Scale for weighing yourself.  Blood pressure monitor.  Notebook.  Medicines. How to care for yourself when you have heart failure Medicines Take over-the-counter and prescription medicines only as told by your doctor. Take your medicines every day.  Do not stop taking your medicine unless your doctor tells you to do so.  Do not skip any medicines.  Get your prescriptions refilled before you run out of medicine. This is important. Eating and drinking   Eat heart-healthy foods. Talk with a diet specialist (dietitian) to create an eating plan.  Choose foods that: ? Have no trans fat. ? Are low in saturated fat and cholesterol.  Choose healthy foods, such as: ? Fresh or frozen fruits and vegetables. ? Fish. ? Low-fat (lean) meats. ? Legumes, such as beans, peas, and lentils. ? Fat-free or low-fat dairy products. ? Whole-grain foods. ? High-fiber foods.  Limit salt (sodium) if told by your doctor. Ask your diet specialist to tell you which seasonings are healthy for your heart.  Cook in healthy ways instead of frying. Healthy ways of cooking include roasting, grilling, broiling, baking, poaching, steaming, and stir-frying.  Limit how much fluid you drink, if told by your doctor. Alcohol  use  Do not drink alcohol if: ? Your doctor tells you not to drink. ? Your heart was damaged by alcohol, or you have very bad heart failure. ? You are pregnant, may be pregnant, or are planning to become pregnant.  If you drink alcohol: ? Limit how much you use to:  0-1 drink a day for women.  0-2 drinks a day for men. ? Be aware of how much alcohol is in your drink. In the [place] one drink equals one 12 oz bottle of beer (355 mL), one 5 oz glass of wine (148 mL), or one 1 oz glass of hard  liquor (44 mL). Lifestyle   Do not use any products that contain nicotine or tobacco, such as cigarettes, e-cigarettes, and chewing tobacco. If you need help quitting, ask your doctor. ? Do not use nicotine gum or patches before talking to your doctor.  Do not use illegal drugs.  Lose weight if told by your doctor.  Do physical activity if told by your doctor. Talk to your doctor before you begin an exercise if: ? You are an older adult. ? You have very bad heart failure.  Learn to manage stress. If you need help, ask your doctor.  Get rehab (rehabilitation) to help you stay independent and to help with your quality of life.  Plan time to rest when you get tired. Check weight and blood pressure   Weigh yourself every day. This will help you to know if fluid is building up in your body. ? Weigh yourself every morning after you pee (urinate) and before you eat breakfast. ? Wear the same amount of clothing each time. ? Write down your daily weight. Give your record to your doctor.  Check and write down your blood pressure as told by your doctor.  Check your pulse as told by your doctor. Dealing with very hot and very cold weather  If it is very hot: ? Avoid activities that take a lot of energy. ? Use air conditioning or fans, or find a cooler place. ? Avoid caffeine and alcohol. ? Wear clothing that is loose-fitting, lightweight, and light-colored.  If it is very cold: ? Avoid activities that take a lot of energy. ? Layer your clothes. ? Wear mittens or gloves, a hat, and a scarf when you go outside. ? Avoid alcohol. Follow these instructions at home:  Stay up to date with shots (vaccines). Get pneumococcal and flu (influenza) shots.  Keep all follow-up visits as told by your doctor. This is important. Contact a doctor if:  You gain weight quickly.  You have increasing shortness of breath.  You cannot do your normal activities.  You get tired easily.  You cough  a lot.  You don't feel like eating or feel like you may vomit (nauseous).  You become puffy (swell) in your hands, feet, ankles, or belly (abdomen).  You cannot sleep well because it is hard to breathe.  You feel like your heart is beating fast (palpitations).  You get dizzy when you stand up. Get help right away if:  You have trouble breathing.  You or someone else notices a change in your behavior, such as having trouble staying awake.  You have chest pain or discomfort.  You pass out (faint). These symptoms may be an emergency. Do not wait to see if the symptoms will go away. Get medical help right away. Call your local emergency services (911 in the [place] Do not drive yourself to the  hospital. Summary  Heart failure is a serious condition. To care for yourself, you may have to change your diet, take medicines, and make other lifestyle changes.  Take your medicines every day. Do not stop taking them unless your doctor tells you to do so.  Eat heart-healthy foods, such as fresh or frozen fruits and vegetables, fish, lean meats, legumes, fat-free or low-fat dairy products, and whole-grain or high-fiber foods.  Ask your doctor if you can drink alcohol. You may have to stop alcohol use if you have very bad heart failure.  Contact your doctor if you gain weight quickly or feel that your heart is beating too fast. Get help right away if you pass out, or have chest pain or trouble breathing. This information is not intended to replace advice given to you by your health care provider. Make sure you discuss any questions you have with your health care provider. Document Revised: 11/01/2018 Document Reviewed: 11/02/2018 Elsevier Patient Education  [place].   Heart Failure Action Plan A heart failure action plan helps you understand what to do when you have symptoms of heart failure. Follow the plan that was created by you and your health care provider. Review your plan each  time you visit your health care provider. Red zone These signs and symptoms mean you should get medical help right away:  You have trouble breathing when resting.  You have a dry cough that is getting worse.  You have swelling or pain in your legs or abdomen that is getting worse.  You suddenly gain more than 2-3 lb (0.9-1.4 kg) in a day, or more than 5 lb (2.3 kg) in one week. This amount may be more or less depending on your condition.  You have trouble staying awake or you feel confused.  You have chest pain.  You do not have an appetite.  You pass out. If you experience any of these symptoms:  Call your local emergency services (911 in the [place] right away or seek help at the emergency department of the nearest hospital. Yellow zone These signs and symptoms mean your condition may be getting worse and you should make some changes:  You have trouble breathing when you are active or you need to sleep with extra pillows.  You have swelling in your legs or abdomen.  You gain 2-3 lb (0.9-1.4 kg) in one day, or 5 lb (2.3 kg) in one week. This amount may be more or less depending on your condition.  You get tired easily.  You have trouble sleeping.  You have a dry cough. If you experience any of these symptoms:  Contact your health care provider within the next day.  Your health care provider may adjust your medicines. Green zone These signs mean you are doing well and can continue what you are doing:  You do not have shortness of breath.  You have very little swelling or no new swelling.  Your weight is stable (no gain or loss).  You have a normal activity level.  You do not have chest pain or any other new symptoms. Follow these instructions at home:  Take over-the-counter and prescription medicines only as told by your health care provider.  Weigh yourself daily. Your target weight is __________ lb (__________ kg). ? Call your health care provider if you gain  more than __________ lb (__________ kg) in a day, or more than __________ lb (__________ kg) in one week.  Eat a heart-healthy diet. Work with  a diet and nutrition specialist (dietitian) to create an eating plan that is best for you.  Keep all follow-up visits as told by your health care provider. This is important. Where to find more information  American Heart Association: www.heart.org Summary  Follow the action plan that was created by you and your health care provider.  Get help right away if you have any symptoms in the Red zone. This information is not intended to replace advice given to you by your health care provider. Make sure you discuss any questions you have with your health care provider. Document Revised: 07/02/2017 Document Reviewed: 08/29/2016 Elsevier Patient Education  2020 [place].   Heart Failure Exacerbation  Heart failure is a condition in which the heart does not fill up with enough blood, and therefore does not pump enough blood and oxygen to the body. When this happens, parts of the body do not get the blood and oxygen they need to function properly. This can cause symptoms such as breathing problems, fatigue, swelling, and confusion. Heart failure exacerbation refers to heart failure symptoms that get worse. The symptoms may get worse suddenly or develop slowly over time. Heart failure exacerbation is a serious medical problem that should be treated right away. What are the causes? A heart failure exacerbation can be triggered by:  Not taking your heart failure medicines correctly.  Infections.  Eating an unhealthy diet or a diet that is high in salt (sodium).  Drinking too much fluid.  Drinking alcohol.  Taking illegal drugs, such as cocaine or methamphetamine.  Not exercising. Other causes include:  Other heart conditions such as an irregular heartbeat (arrhythmia).  Anemia.  Other medical problems, such as kidney failure. Sometimes the  cause of the exacerbation is not known. What are the signs or symptoms? When heart failure symptoms suddenly or slowly get worse, this may be a sign of heart failure exacerbation. Symptoms of heart failure include:  Breathing problems or shortness of breath.  Chronic coughing or wheezing.  Fatigue.  Nausea or lack of appetite.  Feeling light-headed.  Confusion or memory loss.  Increased heart rate or irregular heartbeat.  Buildup of fluid in the legs, ankles, feet, or abdomen.  Difficulty breathing when lying down. How is this diagnosed? This condition is diagnosed based on:  Your symptoms and medical history.  A physical exam. You may also have tests, including:  Electrocardiogram (ECG). This test measures the electrical activity of your heart.  Echocardiogram. This test uses sound waves to take a picture of your heart to see how well it works.  Blood tests.  Imaging tests, such as: ? Chest X-ray. ? MRI. ? Ultrasound.  Stress test. This test examines how well your heart functions when you exercise. Your heart is monitored while you exercise on a treadmill or exercise bike. If you cannot exercise, medicines may be used to increase your heartbeat in place of exercise.  Cardiac catheterization. During this test, a thin, flexible tube (catheter) is inserted into a blood vessel and threaded up to your heart. This test allows your health care provider to check the arteries that lead to your heart (coronary arteries).  Right heart catheterization. During this test, the pressure in your heart is measured. How is this treated? This condition may be treated by:  Adjusting your heart medicines.  Maintaining a healthy lifestyle. This includes: ? Eating a heart-healthy diet that is low in sodium. ? Not using any products that contain nicotine or tobacco, such as cigarettes  and e-cigarettes. ? Regular exercise. ? Monitoring your fluid intake. ? Monitoring your weight and  reporting changes to your health care provider.  Treating sleep apnea, if you have this condition.  Surgery. This may include: ? Implanting a device that helps both sides of your heart contract at the same time (cardiac resynchronization therapy device). This can help with heart function and relieve heart failure symptoms. ? Implanting a device that can correct heart rhythm problems (implantable cardioverter defibrillator). ? Connecting a device to your heart to help it pump blood (ventricular assist device). ? Heart transplant. Follow these instructions at home: Medicines  Take over-the-counter and prescription medicines only as told by your health care provider.  Do not stop taking your medicines or change the amount you take. If you are having problems or side effects from your medicines, talk to your health care provider.  If you are having difficulty paying for your medicines, contact a social worker or your clinic. There are many programs to assist with medicine costs.  Talk to your health care provider before starting any new medicines or supplements.  Make sure your health care provider and pharmacist have a list of all the medicines you are taking. Eating and drinking   Avoid drinking alcohol.  Eat a heart-healthy diet as told by your health care provider. This includes: ? Plenty of fruits and vegetables. ? Lean proteins. ? Low-fat dairy. ? Whole grains. ? Foods that are low in sodium. Activity   Exercise regularly as told by your health care provider. Balance exercise with rest.  Ask your health care provider what activities are safe for you. This includes sexual activity, exercise, and daily tasks at home or work. Lifestyle  Do not use any products that contain nicotine or tobacco, such as cigarettes and e-cigarettes. If you need help quitting, ask your health care provider.  Maintain a healthy weight. Ask your health care provider what weight is healthy for  you.  Consider joining a patient support group. This can help with emotional problems you may have, such as stress and anxiety. General instructions  Talk to your health care provider about flu and pneumonia vaccines.  Keep a list of medicines that you are taking. This may help in emergency situations.  Keep all follow-up visits as told by your health care provider. This is important. Contact a health care provider if:  You have questions about your medicines or you miss a dose.  You feel anxious, depressed, or stressed.  You have swelling in your feet, ankles, legs, or abdomen.  You have shortness of breath during activity or exercise.  You have a cough.  You have a fever.  You have trouble sleeping.  You gain 2-3 lb (1-1.4 kg) in 24 hours or 5 lb (2.3 kg) in a week. Get help right away if:  You have chest pain.  You have shortness of breath while resting.  You have severe fatigue.  You are confused.  You have severe dizziness.  You have a rapid or irregular heartbeat.  You have nausea or you vomit.  You have a cough that is worse at night or you cannot lie flat.  You have a cough that will not go away.  You have severe depression or sadness. Summary  When heart failure symptoms get worse, it is called heart failure exacerbation.  Common causes of this condition include taking medicines incorrectly, infections, and drinking alcohol.  This condition may be treated by adjusting medicines, maintaining a healthy  lifestyle, or surgery.  Do not stop taking your medicines or change the amount you take. If you are having problems or side effects from your medicines, talk to your health care provider. This information is not intended to replace advice given to you by your health care provider. Make sure you discuss any questions you have with your health care provider. Document Revised: 07/02/2017 Document Reviewed: 12/01/2016 Elsevier Patient Education  2020  [place] With Heart Failure  Heart failure is a long-term (chronic) condition in which the heart cannot pump enough blood through the body. When this happens, parts of the body do not get the blood and oxygen they need. There is no cure for heart failure at this time, so it is important for you to take good care of yourself and follow the treatment plan set by your health care provider. If you are living with heart failure, there are ways to help you manage the disease. Follow these instructions at home: Living with heart failure requires you to make changes in your life. Your health care team will teach you about the changes you need to make in order to relieve your symptoms and lower your risk of going to the hospital. Follow the treatment plan as set by your health care provider. Medicines Medicines are important in reducing your heart's workload, slowing the progression of heart failure, and improving your symptoms.  Take over-the-counter and prescription medicines only as told by your health care provider.  Do not stop taking your medicine unless your health care provider tells you to do that.  Do not skip any dose of your medicine.  Refill prescriptions before you run out of medicine. You need your medicines every day. Eating and drinking   Eat heart-healthy foods. Talk with a dietitian to make an eating plan that is right for you. ? If directed by your health care provider:  Limit salt (sodium). Lowering your sodium intake may reduce symptoms of heart failure. Ask a dietitian to recommend heart-healthy seasonings.  Limit your fluid intake. Fluid restriction may reduce symptoms of heart failure. ? Use low-fat cooking methods instead of frying. Low-fat methods include roasting, grilling, broiling, baking, poaching, steaming, and stir-frying. ? Choose foods that contain no trans fat and are low in saturated fat and cholesterol. Healthy choices include fresh or frozen  fruits and vegetables, fish, lean meats, legumes, fat-free or low-fat dairy products, and whole-grain or high-fiber foods.  Limit alcohol intake to no more than 1 drink a day for nonpregnant women and 2 drinks a day for men. One drink equals 12 oz of beer, 5 oz of wine, or 1 oz of hard liquor. ? Drinking more than that is harmful to your heart. Tell your health care provider if you drink alcohol several times a week. ? Talk with your health care provider about whether any level of alcohol use is safe for you. Activity   Ask your health care provider about attending cardiac rehabilitation. These programs include aerobic physical activity, which provides many benefits for your heart.  If no cardiac rehabilitation program is available, ask your health care provider what aerobic exercises are safe for you to do. Lifestyle Make the lifestyle changes recommended by your health care provider. In general:  Lose weight if your health care provider tells you to do that. Weight loss may reduce symptoms of heart failure.  Do not use any products that contain nicotine or tobacco, such as cigarettes or e-cigarettes. If you need  help quitting, ask your health care provider.  Do not use street (illegal) drugs.  Return to your normal activities as told by your health care provider. Ask your health care provider what activities are safe for you. General instructions   Make sure you weigh yourself every day to track your weight. Rapid weight gain may indicate an increase in fluid in your body and may increase the workload of your heart. ? Weigh yourself every morning. Do this after you urinate but before you eat breakfast. ? Wear the same type of clothing, without shoes, each time you weigh yourself. ? Weigh yourself on the same scale and in the same spot each time.  Living with chronic heart failure often leads to emotions such as fear, stress, anxiety, and depression. If you feel any of these emotions  and need help coping, contact your health care provider. Other ways to get help include: ? Talking to friends and family members about your condition. They can give you support and guidance. Explain your symptoms to them and, if comfortable, invite them to attend appointments or rehabilitation with you. ? Joining a support group for people with chronic heart failure. Talking with other people who have the same symptoms may give you new ways of coping with your disease and your emotions.  Stay up to date with your shots (vaccines). Staying current on pneumococcal and influenza vaccines is especially important in preventing germs from attacking your airways (respiratory infections).  Keep all follow-up visits as told by your health care provider. This is important. How to recognize changes in your condition You and your family members need to know what changes to watch for in your condition. Watch for the following changes and report them to your health care provider:  Sudden weight gain. Ask your health care provider what amount of weight gain to report.  Shortness of breath: ? Feeling short of breath while at rest, with no exercise or activity that required great effort. ? Feeling breathless with activity.  Swelling of your lower legs or ankles.  Difficulty sleeping: ? You wake up feeling short of breath. ? You have to use more pillows to raise your head in order to sleep.  Frequent, dry, hacking cough.  Loss of appetite.  Feeling more tired all the time.  Depression or feelings of sadness or hopelessness.  Bloating in the stomach. Where to find more information  Local support groups. Ask your health care provider about groups near you.  The American Heart Association: www.heart.org Contact a health care provider if:  You have a rapid weight gain.  You have increasing shortness of breath that is unusual for you.  You are unable to participate in your usual physical  activities.  You tire easily.  You cough more than normal, especially with physical activity.  You have any swelling or more swelling in areas such as your hands, feet, ankles, or abdomen.  You feel like your heart is beating quickly (palpitations).  You become dizzy or light-headed when you stand up. Get help right away if:  You have difficulty breathing.  You notice or your family notices a change in your awareness, such as having trouble staying awake or having difficulty with concentration.  You have pain or discomfort in your chest.  You have an episode of fainting (syncope). Summary  There is no cure for heart failure, so it is important for you to take good care of yourself and follow the treatment plan set by your health  care provider.  Medicines are important in reducing your heart's workload, slowing the progression of heart failure, and improving your symptoms.  Living with chronic heart failure often leads to emotions such as fear, stress, anxiety, and depression. If you are feeling any of these emotions and need help coping, contact your health care provider. This information is not intended to replace advice given to you by your health care provider. Make sure you discuss any questions you have with your health care provider. Document Revised: 07/02/2017 Document Reviewed: 12/02/2016 Elsevier Patient Education  [place].   IMPORTANT INFORMATION: PAY CLOSE ATTENTION   PHYSICIAN DISCHARGE INSTRUCTIONS  Follow with Primary care provider  [name] [name], [name] Mamie Nick, DO  and other consultants as instructed by your Hospitalist Physician  [place] IF SYMPTOMS COME BACK, WORSEN OR NEW PROBLEM DEVELOPS   Please note: You were cared for by a hospitalist during your hospital stay. Every effort will be made to forward records to your primary care provider.  You can request that your primary care provider send for your hospital records if  they have not received them.  Once you are discharged, your primary care physician will handle any further medical issues. Please note that NO REFILLS for any discharge medications will be authorized once you are discharged, as it is imperative that you return to your primary care physician (or establish a relationship with a primary care physician if you do not have one) for your post hospital discharge needs so that they can reassess your need for medications and monitor your lab values.  Please get a complete blood count and chemistry panel checked by your Primary MD at your next visit, and again as instructed by your Primary MD.  Get Medicines reviewed and adjusted: Please take all your medications with you for your next visit with your Primary MD  Laboratory/radiological data: Please request your Primary MD to go over all hospital tests and procedure/radiological results at the follow up, please ask your primary care provider to get all Hospital records sent to his/her office.  In some cases, they will be blood work, cultures and biopsy results pending at the time of your discharge. Please request that your primary care provider follow up on these results.  If you are diabetic, please bring your blood sugar readings with you to your follow up appointment with primary care.    Please call and make your follow up appointments as soon as possible.    Also Note the following: If you experience worsening of your admission symptoms, develop shortness of breath, life threatening emergency, suicidal or homicidal thoughts you must seek medical attention immediately by calling 911 or calling your MD immediately  if symptoms less severe.  You must read complete instructions/literature along with all the possible adverse reactions/side effects for all the Medicines you take and that have been prescribed to you. Take any new Medicines after you have completely understood and accpet all the possible  adverse reactions/side effects.   Do not drive when taking Pain medications or sleeping medications (Benzodiazepines)  Do not take more than prescribed Pain, Sleep and Anxiety Medications. It is not advisable to combine anxiety,sleep and pain medications without talking with your primary care practitioner  Special Instructions: If you have smoked or chewed Tobacco  in the last 2 yrs please stop smoking, stop any regular Alcohol  and or any Recreational drug use.  Wear Seat belts while driving.  Do not drive if  taking any narcotic, mind altering or controlled substances or recreational drugs or alcohol.

## 2020-08-10 NOTE — Progress Notes (Signed)
IV removed, clean dry and intact. Discharge instructions explained, patient stated he understood. And had no questions.  Pt said family is coming to get him and bringing his home O2. Pts belongings are bagged waiting on family to come get him.

## 2020-09-01 NOTE — Progress Notes (Incomplete)
error 

## 2020-09-01 NOTE — Progress Notes (Signed)
Cardiology Office Note  Date: 07/31/2020   ID: [name], [name] May 06, 1946, MRN [id number]  PCP:  [name]  Cardiologist:  Rozann Lesches, MD Electrophysiologist:  None   Chief Complaint: Hospital follow-up new onset atrial fibrillation, acute on chronic CHF, pneumonia due to COVID-19 virus.  Chronic kidney disease stage IV, morbid obesity, type 2 diabetes, hypertension, sleep apnea.  NSTEMI, CAD.  History of Present Illness: [name] [name] is a 75 y.o. male with a history of CAD, HLD, bilateral leg pain, CKD stage III, hyperlipidemia, NSTEMI, OSA, DM type II, morbid obesity, non-Hodgkin's lymphoma, hypothyroidism, morbid obesity.  Last encounter with Dr. Domenic Polite 04/04/2020.  He had been complaining of some bilateral leg pain and was told he needed to be evaluated for possible PAD.  He denied any anginal symptoms, palpitations or syncope.  Prior echocardiogram in April 2021 showed EF of 45 to 50% with moderate diastolic dysfunction.  Mildly reduced RV contraction, mild biatrial enlargement, no major valvular abnormalities.  Blood pressure was elevated during that visit and he stated this was in a similar range at home.  Had a recent hospital admission on 08/04/2020 with presentation to the ED with worsening dyspnea, leg swelling, slight increase in cough.  Describes dyspnea as being constant, worsening moderate to severe.  He was deemed to be in acute diastolic heart failure volume overload.  Diuresed well on IV Lasix every 12.  Renal function was stable.  He was ready to go home with weight below 310 pounds.  He was discharged on 80 Lasix daily with calcium supplementation 20 mg p.o. twice daily.  He was on supplemental oxygen.  His persistent atrial fibrillation was rate controlled without agents.  His bisoprolol was held due to bradycardia.  He was continuing.  His stage IV CKD with stable creatinine.   He is here for 1 month follow-up.  States he feels 100%  better since last visit.  At last visit he was recovering from Covid pneumonia.  States he feels good and has decent activity tolerance on continuous nasal O2.  He states without the nasal O2 he becomes short of breath.  He denies any recent palpitations or arrhythmias, orthostatic symptoms, CVA or TIA-like symptoms, PND, orthopnea, bleeding, denies any claudication-like symptoms, DVT or PE-like symptoms, or lower extremity edema.  He is wearing low-grade compression stockings today.  Blood pressure is well controlled on current therapy blood pressure today 128/70.   Past Medical History:  Diagnosis Date   CAD (coronary artery disease)    DES to LAD June 2018   CKD (chronic kidney disease) stage 3, GFR 30-59 ml/min (HCC)    COVID    Essential hypertension    History of pneumonia    Hyperlipidemia    Hypothyroidism    LBBB (left bundle branch block)    Morbid obesity ([place])    Non Hodgkin's lymphoma ([place])    Status post XRT and chemotherapy   NSTEMI (non-ST elevated myocardial infarction) [facility])    June 2018   Peripheral neuropathy    Sleep apnea    Type 2 diabetes mellitus ([place])     Past Surgical History:  Procedure Laterality Date   CHOLECYSTECTOMY  1992   COLONOSCOPY N/A 09/03/2014   SLF:six colon polyps removed/small internal hemorrhoids   CORONARY STENT INTERVENTION N/A 01/21/2017   Procedure: Coronary Stent Intervention;  Surgeon: Nelva Bush, MD;  Location: [place] [facility];  Service: Cardiovascular;  Laterality: N/A;   ESOPHAGOGASTRODUODENOSCOPY N/A 09/03/2014  SLF: mild gastritis/few gastric polyps   LEFT HEART CATH AND CORONARY ANGIOGRAPHY N/A 01/20/2017   Procedure: Left Heart Cath and Coronary Angiography;  Surgeon: Jettie Booze, MD;  Location: [place] [facility];  Service: Cardiovascular;  Laterality: N/A;   TOTAL KNEE ARTHROPLASTY  11/10/2011   Procedure: TOTAL KNEE ARTHROPLASTY;  Surgeon: Mauri Pole, MD;  Location: [facility];  Service:  Orthopedics;  Laterality: Right;   TOTAL KNEE ARTHROPLASTY Left 05/24/2018   Procedure: LEFT TOTAL KNEE ARTHROPLASTY;  Surgeon: Melrose Nakayama, MD;  Location: [place];  Service: Orthopedics;  Laterality: Left;    Current Outpatient Medications  Medication Sig Dispense Refill   acetaminophen (TYLENOL) 325 MG tablet Take 2 tablets (650 mg total) by mouth every 6 (six) hours as needed for mild pain (or Fever >/= 101). 30 tablet 1   albuterol (VENTOLIN HFA) 108 (90 Base) MCG/ACT inhaler Inhale 1-2 puffs into the lungs every 6 (six) hours as needed for wheezing or shortness of breath. 1 Inhaler 0   amLODipine (NORVASC) 10 MG tablet Take 10 mg by mouth daily.     apixaban (ELIQUIS) 2.5 MG TABS tablet Take 1 tablet (2.5 mg total) by mouth 2 (two) times daily. 60 tablet 6   bisoprolol (ZEBETA) 5 MG tablet Take 2.5 mg by mouth daily.     furosemide (LASIX) 40 MG tablet Take 40 mg by mouth daily.     insulin aspart (NOVOLOG) 100 UNIT/ML FlexPen See admin instructions.     insulin degludec (TRESIBA FLEXTOUCH) 100 UNIT/ML SOPN FlexTouch Pen Inject 0.5 mLs (50 Units total) into the skin daily at 10 pm. (Patient taking differently: Inject 60 Units into the skin daily at 10 pm.)     ipratropium-albuterol (DUONEB) 0.5-2.5 (3) MG/3ML SOLN Inhale 3 mLs into the lungs every 4 (four) hours as needed.      isosorbide mononitrate (IMDUR) 30 MG 24 hr tablet Take 1 tablet (30 mg total) by mouth daily. 30 tablet 1   levocetirizine (XYZAL) 5 MG tablet Take 5 mg by mouth every evening.      levothyroxine (SYNTHROID) 100 MCG tablet Take 100 mcg by mouth daily. (takes with 239mcg for a total of 381mcg)     levothyroxine (SYNTHROID, LEVOTHROID) 200 MCG tablet Take 300 mcg by mouth daily before breakfast. (takes with 141mcg for a total of 361mcg)     nitroGLYCERIN (NITROSTAT) 0.4 MG SL tablet Place 0.4 mg under the tongue every 5 (five) minutes as needed for chest pain.   11   OXYGEN Inhale 2 L into the lungs  daily as needed.      senna-docusate (SENOKOT-S) 8.6-50 MG tablet Take 1 tablet by mouth daily as needed.      simvastatin (ZOCOR) 5 MG tablet Take 5 mg by mouth daily.     SYMBICORT 160-4.5 MCG/ACT inhaler Inhale 2 puffs into the lungs daily.     fluticasone (FLONASE) 50 MCG/ACT nasal spray Place 2 sprays into both nostrils daily for 14 days. (Patient taking differently: Place 2 sprays into both nostrils as needed. ) 1 g 0   hydrALAZINE (APRESOLINE) 25 MG tablet Take 1 tablet (25 mg total) by mouth 2 (two) times daily. (Patient taking differently: Take 25 mg by mouth daily. ) 180 tablet 1   No current facility-administered medications for this visit.   Allergies:  Patient has no known allergies.   Social History: The patient  reports that he quit smoking about 13 years ago. His smoking use included cigarettes. He  has a 7.50 pack-year smoking history. He has never used smokeless tobacco. He reports that he does not drink alcohol and does not use drugs.   Family History: The patient's family history includes Cancer in his father, maternal uncle, mother, and paternal uncle.   ROS:  Please see the history of present illness. Otherwise, complete review of systems is positive for none.  All other systems are reviewed and negative.   Physical Exam: VS:  BP 118/64    Pulse 73    Resp 16    SpO2 93% , BMI There is no height or weight on file to calculate BMI.  Wt Readings from Last 3 Encounters:  07/20/20 (!) 308 lb 1.6 oz (139.8 kg)  06/24/20 296 lb 8.3 oz (134.5 kg)  04/04/20 (!) 303 lb (137.4 kg)    General: Severely morbidly obese patient appears comfortable at rest. Neck: Supple, no elevated JVP or carotid bruits, no thyromegaly. Lungs: Clear to auscultation, nonlabored breathing at rest. Cardiac: Regular rate and rhythm, no S3 or significant systolic murmur, no pericardial rub. Extremities: No pitting edema, distal pulses 2+. Skin: Warm and dry. Musculoskeletal: No  kyphosis. Neuropsychiatric: Alert and oriented x3, affect grossly appropriate.  ECG:  An ECG dated 07/31/2020 was personally reviewed today and demonstrated:  Atrial fibrillation with left axis deviation, left bundle branch block rate of 71  Recent Labwork: 06/27/2020: TSH 2.213 07/19/2020: B Natriuretic Peptide 182.0 07/20/2020: ALT 12; AST 15; BUN 34; Creatinine, Ser 2.50; Hemoglobin 15.8; Magnesium 2.3; Platelets 111; Potassium 3.3; Sodium 138     Component Value Date/Time   CHOL 95 06/25/2020 0607   TRIG 126 06/25/2020 0607   HDL 21 (L) 06/25/2020 0607   CHOLHDL 4.5 06/25/2020 0607   VLDL 25 06/25/2020 0607   LDLCALC 49 06/25/2020 0607    Other Studies Reviewed Today:  Chest x-ray 07/19/2020 IMPRESSION: Multifocal pneumonia with bilateral trace pleural effusions. Followup PA and lateral chest X-ray is recommended in 3-4 weeks following therapy to ensure resolution and exclude underlying malignancy.   Echocardiogram 07/20/2020 IMPRESSIONS 1. Left ventricular ejection fraction, by estimation, is 50 to 55%. The  left ventricle has low normal function. Left ventricular endocardial  border not optimally defined to evaluate regional wall motion. Left  ventricular diastolic parameters are  consistent with Grade II diastolic dysfunction (pseudonormalization).  Elevated left atrial pressure.  2. Right ventricular systolic function is normal. The right ventricular  size is normal.  3. Left atrial size was mildly dilated.  4. The mitral valve is normal in structure. Trivial mitral valve  regurgitation. No evidence of mitral stenosis.  5. The aortic valve is tricuspid. There is mild calcification of the  aortic valve. There is mild thickening of the aortic valve. Aortic valve  regurgitation is not visualized. No aortic stenosis is present.  6. The inferior vena cava is normal in size with greater than 50%  respiratory variability, suggesting right atrial pressure of 3  mmHg.    Lower extremity arterial duplex / ABI 04/17/2020 Right: Resting right ankle-brachial index is within normal range. No evidence of significant right lower extremity arterial disease. The right toe-brachial index is normal. Left: Resting left ankle-brachial index is within normal range. No evidence of significant left lower extremity arterial disease. The left toe-brachial index is normal.   Echocardiogram 11/17/2019: 1. Left ventricular ejection fraction, by estimation, is 45 to 50%. The  left ventricle has mildly decreased function. The left ventricle  demonstrates global hypokinesis. The left ventricular internal cavity size  was severely dilated. There is moderate  left ventricular hypertrophy of the septal segment. Left ventricular  diastolic parameters are consistent with Grade II diastolic dysfunction  (pseudonormalization). Elevated left ventricular end-diastolic pressure.  2. Right ventricular systolic function is mildly reduced. The right  ventricular size is mildly enlarged. There is normal pulmonary artery  systolic pressure.  3. Left atrial size was mildly dilated.  4. Right atrial size was mildly dilated.  5. The mitral valve is grossly normal. No evidence of mitral valve  regurgitation.  6. The aortic valve was not well visualized. Aortic valve regurgitation  is trivial.  7. The inferior vena cava is normal in size with greater than 50%  respiratory variability, suggesting right atrial pressure of 3 mmHg  Assessment and Plan:   1. Atrial fibrillation, new onset ([place]) Heart rate today is 78 and irregularly irregular.  No recent bleeding on Eliquis continue Eliquis 2.5 mg p.o. twice daily.  We will continue to hold bisoprolol for now due to controlled heart rate.. CHADS2-Vasc 4  2. Coronary artery disease involving native coronary artery of native heart without angina pectoris Denies any anginal symptoms today.  Continue Imdur 30 mg daily.  Aspirin was  stopped and Eliquis started at last visit for systemic anticoagulation for new onset atrial fibrillation.  Continue nitroglycerin sublingual as needed.  Continue simvastatin 5 mg daily.  3. Essential hypertension Blood pressure well controlled on current therapy continue amlodipine 10 mg daily, bisoprolol 2.5 mg daily.  Continue hydralazine 25 mg p.o. twice daily.   5. Stage 3b chronic kidney disease [facility]) Patient states he sees a nephrologist in [facility].  Advised him at last visit to schedule a follow-up appointment with nephrology.      6.  Pleural effusion/status post Covid pneumonia We ordered a repeat chest x-ray in 2 weeks around January 14 at last visit for recheck of pleural effusions and status post Covid pneumonia.  States he never got the chest x-ray.  However he states his breathing has significantly improved since then.  Lungs are clear throughout to auscultation.  Recent chest x-ray 08/04/2020 during recent hospital stay for CHF showed stable hazy opacitiesthroughout both lungs concerning for atypical pneumonia.   7.  Acute on chronic congestive heart failure. Denies any significant DOE but is not very active.  He is on continuous O2.  States he is okay as long as he is on this O2.  Recent weight gain from September where he weighed 303.  Today weight is 307.  He denies any PND or orthopnea.  No lower extremity edema noted.  He is wearing compression stockings.  Recent echocardiogram on 07/20/2020 demonstrated EF of 50 to 55%.  G2 DD, LA mildly dilated trivial MR. Continue furosemide 80 mg daily.  We will continue to hold bisoprolol for now due to significant bradycardia during recent hospital admission.  Heart rate today is 78.   Medication Adjustments/Labs and Tests Ordered: Current medicines are reviewed at length with the patient today.  Concerns regarding medicines are outlined above.   Disposition: Follow-up with Dr. Domenic Polite or APP 6 months  Signed, Levell July, NP   07/31/2020 12:53 PM    [facility], [place], [place] [address] Phone: [phone]; Fax: [phone]

## 2020-09-02 ENCOUNTER — Ambulatory Visit (INDEPENDENT_AMBULATORY_CARE_PROVIDER_SITE_OTHER): Payer: Medicare HMO | Admitting: Family Medicine

## 2020-09-02 ENCOUNTER — Encounter: Payer: Self-pay | Admitting: Family Medicine

## 2020-09-02 VITALS — BP 128/70 | HR 78 | Ht 73.0 in | Wt 307.0 lb

## 2020-09-02 DIAGNOSIS — I10 Essential (primary) hypertension: Secondary | ICD-10-CM | POA: Diagnosis not present

## 2020-09-02 DIAGNOSIS — I2510 Atherosclerotic heart disease of native coronary artery without angina pectoris: Secondary | ICD-10-CM | POA: Diagnosis not present

## 2020-09-02 DIAGNOSIS — I4891 Unspecified atrial fibrillation: Secondary | ICD-10-CM

## 2020-09-02 DIAGNOSIS — N183 Chronic kidney disease, stage 3 unspecified: Secondary | ICD-10-CM

## 2020-09-02 DIAGNOSIS — J90 Pleural effusion, not elsewhere classified: Secondary | ICD-10-CM

## 2020-09-02 NOTE — Patient Instructions (Signed)
Medication Instructions:  Continue all current medications.   Labwork: none  Testing/Procedures: none  Follow-Up: 6 months   Any Other Special Instructions Will Be Listed Below (If Applicable).   If you need a refill on your cardiac medications before your next appointment, please call your pharmacy.  

## 2020-09-10 ENCOUNTER — Encounter: Payer: Self-pay | Admitting: Internal Medicine

## 2020-09-10 ENCOUNTER — Ambulatory Visit: Payer: Medicare HMO | Admitting: Internal Medicine

## 2020-09-10 DIAGNOSIS — J9612 Chronic respiratory failure with hypercapnia: Secondary | ICD-10-CM

## 2020-09-10 DIAGNOSIS — J9611 Chronic respiratory failure with hypoxia: Secondary | ICD-10-CM | POA: Diagnosis not present

## 2020-09-10 DIAGNOSIS — R0609 Other forms of dyspnea: Secondary | ICD-10-CM

## 2020-09-10 DIAGNOSIS — R0600 Dyspnea, unspecified: Secondary | ICD-10-CM

## 2020-09-10 NOTE — Assessment & Plan Note (Signed)
Onset in childhood ? Wt related  - quit smoking 10/2019 due to cough, more limited by knees than sob at baseline - 02 dep since covid 19 pna nov 2021   Picture is more c/w chf/ ohs than copd but needs pfts to sort out.  In meantime: I spent extra time with pt today reviewing appropriate use of albuterol for prn use on exertion with the following points: 1) saba is for relief of sob that does not improve by walking a slower pace or resting but rather if the pt does not improve after trying this first. 2) If the pt is convinced, as many are, that saba helps recover from activity faster then it's easy to tell if this is the case by re-challenging : ie stop, take the inhaler, then p 5 minutes try the exact same activity (intensity of workload) that just caused the symptoms and see if they are substantially diminished or not after saba 3) if there is an activity that reproducibly causes the symptoms, try the saba 15 min before the activity on alternate days   If in fact the saba really does help, then fine to continue to use it prn but advised may need to look closer at the maintenance regimen being used to achieve better control of airways disease with exertion.

## 2020-09-10 NOTE — Assessment & Plan Note (Addendum)
HC03   08/10/20 = 33     c/w OHS > COPD  Advised the goal is to keep sats > 90% but not need to drive higher / seems to be doing fine on the amt he's using at hs s am HA or AMS so no need to change but should be able to wean daytime - see avs for instructions unique to this ov

## 2020-09-10 NOTE — Assessment & Plan Note (Signed)
Body mass index is 40.5 kg/m.  -    Lab Results  Component Value Date   TSH 7.776 (H) 08/08/2020     Contributing to   doe/reviewed the need and the process to achieve and maintain neg calorie balance > defer f/u primary care including intermittently monitoring thyroid status            Each maintenance medication was reviewed in detail including emphasizing most importantly the difference between maintenance and prns and under what circumstances the prns are to be triggered using an action plan format where appropriate.  Total time for H and P, chart review, counseling, reviewing hfa/IS/ neb/ spacer/02  device(s) and generating customized AVS unique to this new pt office visit / same day charting =  45 min

## 2020-09-10 NOTE — Progress Notes (Signed)
KORBYN CHOPIN, male    DOB: 12-10-1945,    MRN: [id number]   Brief patient profile:  47 yowm never had good ex tolerance?  attributed to wt and quit smoking 10/2019 with cough which improved with baseline limited more by knees than breathing and used [facility] parking, cane to get in store then ride the scooter    .Brief/Interim Summary: 75 y.o.malewith medical history significant ofCAD, history of NSTEMI/history of stent placement in 2018, LBBB, stage IV CKD, hypertension, history of pneumonia, hyperlipidemia, hypothyroidism, class III obesity, history of non-Hodgkin's lymphoma with chemo and radiotherapy, sleep apnea, peripheral neuropathy, type 2 diabetes mellitus who is brought to the emergency department due toprogressively weakness and confusion since developing persisting cough after apparently aspirating water while drinking.   He was admitted with Covid pneumonia and concern for aspiration pneumonia. This was witnessed by a family member who said he choked after drinking some water and then began to have respiratory distress afterwards.  .  Discharge Diagnoses:  Acute respiratory Failure due to COVID-19 -initially placed on 4L - stable on2L>>RA -finished remdesivir 11/26 -changed to IV solumedrol -CRP 11.3>>8.6>>5.0>>2.8>>1.2 -D-Dimer 1.75>>1.45>>1.47>1.63 -Ferritin 495>>524>>436>>653>>479 -wean oxygen as tolerated   Aspiration Pneumonitis -evaluated by speech--regular with thin -initially on unasyn -PCT<0.10--d/cd abx    CKD stage 4 -baseline creatinine 2.3-2.6 -serum creatinine 2.08 on day of d/c    Diabetes mellitus type 2 -11/22 A1C--5.8 -elevated CBG due to steroids -addednovolog8units with meals -increasedlevemir 15 unit bid -rec restart [place] after d/c  Acute metabolic Encephalopathy -due to dehydration and COVID-19 -improving  CAD -no chest pain -continue ASA, imdur, zocor -holding bisoprolol due to bradycardia  Essential  HTN -continue amlodipine and imdur  Hypothyroidism -continue synthroid    Thrombocytopenia -B12--525 -folate--7.0 -TSH--2.213 -due to viral infection      Deconditioning -PT-recommended SNF -patient  refused SNF> left ama     History of Present Illness  09/10/2020  Pulmonary/ 1st office eval/ [facility] / [place] [facility]  Chief Complaint  Patient presents with  . Consult    Shortness of breath with activity since November 2021  Dyspnea:  Uses walker limited to walking  room to room not titrating 02 but overall feels trending in right direction finally  Cough: not much at all  Sleep: bed is flat/ 3-4 pillows/ on side   SABA use: really confused with names of meds /  nebs  02  3lpm 24/7   No obvious day to day or daytime variability or assoc excess/ purulent sputum or mucus plugs or hemoptysis or cp or chest tightness, subjective wheeze or overt sinus or hb symptoms.   Sleeping now without nocturnal  or early am exacerbation  of respiratory  c/o's or need for noct saba. Also denies any obvious fluctuation of symptoms with weather or environmental changes or other aggravating or alleviating factors except as outlined above   No unusual exposure hx or h/o childhood pna/ asthma or knowledge of premature birth.  Current Allergies, Complete Past Medical History, Past Surgical History, Family History, and Social History were reviewed in [employer] record.  ROS  The following are not active complaints unless bolded Hoarseness, sore throat, dysphagia, dental problems, itching, sneezing,  nasal congestion or discharge of excess mucus or purulent secretions, ear ache,   fever, chills, sweats, unintended wt loss or wt gain, classically pleuritic or exertional cp,  orthopnea pnd or arm/hand swelling  or leg swelling, presyncope, palpitations, abdominal pain, anorexia, nausea, vomiting, diarrhea  or change  in bowel habits or change in bladder habits, change in stools  or change in urine, dysuria, hematuria,  rash, arthralgias, visual complaints, headache, numbness, weakness or ataxia or problems with walking or coordination,  change in mood or  memory.             Past Medical History:  Diagnosis Date  . Atrial fibrillation ([place])   . CAD (coronary artery disease)    DES to LAD June 2018  . CKD (chronic kidney disease) stage 3, GFR 30-59 ml/min (HCC)   . Essential hypertension   . History of pneumonia   . Hyperlipidemia   . Hypothyroidism   . LBBB (left bundle branch block)   . Morbid obesity ([place])   . Non Hodgkin's lymphoma ([place])    Status post XRT and chemotherapy  . NSTEMI (non-ST elevated myocardial infarction) [facility])    June 2018  . Peripheral neuropathy   . Pneumonia due to COVID-19 virus   . Sleep apnea   . Type 2 diabetes mellitus ([place])     Outpatient Medications Prior to Visit-   Medication Sig Dispense Refill  . acetaminophen (TYLENOL) 325 MG tablet Take 2 tablets (650 mg total) by mouth every 6 (six) hours as needed for mild pain (or Fever >/= 101). 30 tablet 1  . albuterol (VENTOLIN HFA) 108 (90 Base) MCG/ACT inhaler Inhale 1-2 puffs into the lungs every 6 (six) hours as needed for wheezing or shortness of breath. 1 Inhaler 0  . amLODipine (NORVASC) 10 MG tablet Take 10 mg by mouth daily.    Marland Kitchen apixaban (ELIQUIS) 2.5 MG TABS tablet Take 1 tablet (2.5 mg total) by mouth 2 (two) times daily. 60 tablet 6  . aspirin EC 81 MG tablet Take 81 mg by mouth daily. Swallow whole.    . furosemide (LASIX) 80 MG tablet Take 1 tablet (80 mg total) by mouth daily. 30 tablet 1  . insulin degludec (TRESIBA) 100 UNIT/ML FlexTouch Pen Inject 56 Units into the skin daily at 10 pm.    . ipratropium-albuterol (DUONEB) 0.5-2.5 (3) MG/3ML SOLN Inhale 3 mLs into the lungs every 4 (four) hours as needed.     . isosorbide mononitrate (IMDUR) 30 MG 24 hr tablet Take 1 tablet (30 mg total) by mouth daily. 30 tablet 1  . levocetirizine (XYZAL) 5 MG tablet Take 5  mg by mouth every evening.     Marland Kitchen levothyroxine (SYNTHROID) 100 MCG tablet Take 100 mcg by mouth daily. (takes with 261mcg for a total of 312mcg)    . levothyroxine (SYNTHROID, LEVOTHROID) 200 MCG tablet Take 300 mcg by mouth daily before breakfast. (takes with 156mcg for a total of 350mcg)    . nitroGLYCERIN (NITROSTAT) 0.4 MG SL tablet Place 0.4 mg under the tongue every 5 (five) minutes as needed for chest pain.   11  . OXYGEN Inhale 3 L into the lungs daily as needed.    . potassium chloride SA (KLOR-CON) 20 MEQ tablet Take 1 tablet (20 mEq total) by mouth 2 (two) times daily. 60 tablet 1  . senna-docusate (SENOKOT-S) 8.6-50 MG tablet Take 1 tablet by mouth daily as needed.     . simvastatin (ZOCOR) 5 MG tablet Take 5 mg by mouth daily.    . SYMBICORT 160-4.5 MCG/ACT inhaler Inhale 2 puffs into the lungs daily.    . fluticasone (FLONASE) 50 MCG/ACT nasal spray Place 2 sprays into both nostrils as needed for up to 14 days.     No facility-administered  medications prior to visit.     Objective:     BP 128/70 (BP Location: Left Arm, Cuff Size: Normal)   Pulse 70   Temp 97.7 F (36.5 C) (Temporal)   Ht 6\' 1"  (1.854 m)   Wt (!) 307 lb (139.3 kg)   SpO2 100% Comment: 3L O2  BMI 40.50 kg/m   SpO2: 100 % (3L O2) O2 Type: Continuous O2 O2 Flow Rate (L/min): 3 L/min   Wt Readings from Last 3 Encounters:  09/10/20 (!) 307 lb (139.3 kg)  09/02/20 (!) 307 lb (139.3 kg)  08/10/20 (!) 310 lb 13.6 oz (141 kg)      General appearance:     W/c bound obese elderly  wm nad    HEENT : pt wearing mask not removed for exam due to covid -19 concerns.    NECK :  without JVD/Nodes/TM/ nl carotid upstrokes bilaterally   LUNGS: no acc muscle use,  Nl contour chest which is clear to A and P bilaterally without cough on insp or exp maneuvers   CV:  RRR  no s3 or murmur or increase in P2, and trace edema both LEs   ABD:  Quite obese soft and nontender with nl inspiratory excursion in the  supine position. No bruits or organomegaly appreciated, bowel sounds nl  MS:   ext warm without deformities, calf tenderness, cyanosis or clubbing No obvious joint restrictions   SKIN: warm and dry without lesions    NEURO:  alert, approp, nl sensorium with  no motor or cerebellar deficits apparent.     I personally reviewed images and  impression as follows:  CXR: portable 08/04/20 CM with poor aeration bilaterally     Labs   reviewed:      Chemistry      Component Value Date/Time   NA 142 08/10/2020 0500   K 3.6 08/10/2020 0500   CL 101 08/10/2020 0500   CO2 33 (H) 08/10/2020 0500   BUN 40 (H) 08/10/2020 0500   CREATININE 2.37 (H) 08/10/2020 0500      Component Value Date/Time   CALCIUM 8.3 (L) 08/10/2020 0500   ALKPHOS 77 08/04/2020 1451   AST 13 (L) 08/04/2020 1451   ALT 11 08/04/2020 1451   BILITOT 1.1 08/04/2020 1451        Lab Results  Component Value Date   WBC 5.9 08/04/2020   HGB 15.0 08/04/2020   HCT 48.3 08/04/2020   MCV 95.1 08/04/2020   PLT 122 (L) 08/04/2020     Lab Results  Component Value Date   DDIMER 1.22 (H) 06/29/2020      Lab Results  Component Value Date   TSH 7.776 (H) 08/08/2020     Lab Results  Component Value Date   PROBNP 299.5 (H) 07/23/2014      No results found for: ESRSEDRATE         Assessment   DOE (dyspnea on exertion) Onset in childhood ? Wt related  - quit smoking 10/2019 due to cough, more limited by knees than sob at baseline - 02 dep since covid 19 pna nov 2021  - Echo 07/20/20 1. Left ventricular ejection fraction, by estimation, is 50 to 55%. The  left ventricle has low normal function. Left ventricular endocardial  border not optimally defined to evaluate regional wall motion. Left  ventricular diastolic parameters are  consistent with Grade II diastolic dysfunction (pseudonormalization).  Elevated left atrial pressure.  2. Right ventricular systolic function is normal. The right  ventricular   size is normal.  3. Left atrial size was mildly dilated.  4. The mitral valve is normal in structure. Trivial mitral valve  regurgitation. No evidence of mitral stenosis.  5. The aortic valve is tricuspid. There is mild calcification of the  aortic valve. There is mild thickening of the aortic valve. Aortic valve  regurgitation is not visualized. No aortic stenosis is present.  6. The inferior vena cava is normal in size with greater than 50%  respiratory variability, suggesting right atrial pressure of 3 mmHg.  Picture is more c/w chf/ ohs than copd but needs pfts to sort out.  In meantime: I spent extra time with pt today reviewing appropriate use of albuterol for prn use on exertion with the following points: 1) saba is for relief of sob that does not improve by walking a slower pace or resting but rather if the pt does not improve after trying this first. 2) If the pt is convinced, as many are, that saba helps recover from activity faster then it's easy to tell if this is the case by re-challenging : ie stop, take the inhaler, then p 5 minutes try the exact same activity (intensity of workload) that just caused the symptoms and see if they are substantially diminished or not after saba 3) if there is an activity that reproducibly causes the symptoms, try the saba 15 min before the activity on alternate days   If in fact the saba really does help, then fine to continue to use it prn but advised may need to look closer at the maintenance regimen being used to achieve better control of airways disease with exertion.     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  -    Lab Results  Component Value Date   TSH 7.776 (H) 08/08/2020     Contributing to   doe/reviewed the need and the process to achieve and maintain neg calorie balance > defer f/u primary care including intermittently monitoring thyroid status            Each maintenance medication was reviewed in detail including emphasizing most importantly the difference between maintenance and prns and under what circumstances the prns are to be triggered using an action plan format where appropriate.  Total time for H and P, chart review, counseling, reviewing hfa/IS/ neb/ spacer/02  device(s) and generating customized AVS unique to this new pt office visit / same day charting =  45 min          Christinia Gully, MD 09/10/2020

## 2020-09-10 NOTE — Patient Instructions (Addendum)
Ok to Try albuterol 15 min before an activity that you know would make you short of breath and see if it makes any difference and if makes none then don't take it after activity unless you can't catch your breath.      Make sure you check your oxygen saturation  at your highest level of activity  to be sure it stays over 90% and adjust  02 flow upward to maintain this level if needed but remember to turn it back to previous settings when you stop (to conserve your supply).    Please schedule a follow up office visit in 6 weeks, call sooner if needed with all medications /inhalers/ solutions in hand so we can verify exactly what you are taking. This includes all medications from all doctors and over the counters -  PFTs on return - don't use your inhalers that day but bring cane

## 2020-09-19 ENCOUNTER — Other Ambulatory Visit: Payer: Self-pay

## 2020-09-19 ENCOUNTER — Inpatient Hospital Stay (HOSPITAL_COMMUNITY): Payer: Medicare HMO | Attending: Hematology

## 2020-09-19 DIAGNOSIS — E1122 Type 2 diabetes mellitus with diabetic chronic kidney disease: Secondary | ICD-10-CM | POA: Insufficient documentation

## 2020-09-19 DIAGNOSIS — D696 Thrombocytopenia, unspecified: Secondary | ICD-10-CM | POA: Diagnosis not present

## 2020-09-19 DIAGNOSIS — Z79899 Other long term (current) drug therapy: Secondary | ICD-10-CM | POA: Diagnosis not present

## 2020-09-19 DIAGNOSIS — N183 Chronic kidney disease, stage 3 unspecified: Secondary | ICD-10-CM | POA: Diagnosis not present

## 2020-09-19 DIAGNOSIS — E039 Hypothyroidism, unspecified: Secondary | ICD-10-CM | POA: Diagnosis not present

## 2020-09-19 DIAGNOSIS — C8580 Other specified types of non-Hodgkin lymphoma, unspecified site: Secondary | ICD-10-CM

## 2020-09-19 DIAGNOSIS — Z87891 Personal history of nicotine dependence: Secondary | ICD-10-CM | POA: Diagnosis not present

## 2020-09-19 DIAGNOSIS — I129 Hypertensive chronic kidney disease with stage 1 through stage 4 chronic kidney disease, or unspecified chronic kidney disease: Secondary | ICD-10-CM | POA: Insufficient documentation

## 2020-09-19 DIAGNOSIS — I4891 Unspecified atrial fibrillation: Secondary | ICD-10-CM | POA: Diagnosis not present

## 2020-09-19 DIAGNOSIS — C884 Extranodal marginal zone B-cell lymphoma of mucosa-associated lymphoid tissue [MALT-lymphoma]: Secondary | ICD-10-CM | POA: Insufficient documentation

## 2020-09-19 LAB — CBC WITH DIFFERENTIAL/PLATELET
Abs Immature Granulocytes: 0.01 10*3/uL (ref 0.00–0.07)
Basophils Absolute: 0.1 10*3/uL (ref 0.0–0.1)
Basophils Relative: 1 %
Eosinophils Absolute: 0.2 10*3/uL (ref 0.0–0.5)
Eosinophils Relative: 4 %
HCT: 47.7 % (ref 39.0–52.0)
Hemoglobin: 15.3 g/dL (ref 13.0–17.0)
Immature Granulocytes: 0 %
Lymphocytes Relative: 28 %
Lymphs Abs: 1.7 10*3/uL (ref 0.7–4.0)
MCH: 29.1 pg (ref 26.0–34.0)
MCHC: 32.1 g/dL (ref 30.0–36.0)
MCV: 90.9 fL (ref 80.0–100.0)
Monocytes Absolute: 0.5 10*3/uL (ref 0.1–1.0)
Monocytes Relative: 7 %
Neutro Abs: 3.8 10*3/uL (ref 1.7–7.7)
Neutrophils Relative %: 60 %
Platelets: 134 10*3/uL — ABNORMAL LOW (ref 150–400)
RBC: 5.25 MIL/uL (ref 4.22–5.81)
RDW: 15.1 % (ref 11.5–15.5)
WBC: 6.3 10*3/uL (ref 4.0–10.5)
nRBC: 0 % (ref 0.0–0.2)

## 2020-09-19 LAB — LACTATE DEHYDROGENASE: LDH: 130 U/L (ref 98–192)

## 2020-09-19 LAB — COMPREHENSIVE METABOLIC PANEL
ALT: 10 U/L (ref 0–44)
AST: 12 U/L — ABNORMAL LOW (ref 15–41)
Albumin: 3.8 g/dL (ref 3.5–5.0)
Alkaline Phosphatase: 67 U/L (ref 38–126)
Anion gap: 9 (ref 5–15)
BUN: 25 mg/dL — ABNORMAL HIGH (ref 8–23)
CO2: 24 mmol/L (ref 22–32)
Calcium: 8.7 mg/dL — ABNORMAL LOW (ref 8.9–10.3)
Chloride: 103 mmol/L (ref 98–111)
Creatinine, Ser: 2.49 mg/dL — ABNORMAL HIGH (ref 0.61–1.24)
GFR, Estimated: 27 mL/min — ABNORMAL LOW (ref >60)
Glucose, Bld: 165 mg/dL — ABNORMAL HIGH (ref 70–99)
Potassium: 3.6 mmol/L (ref 3.5–5.1)
Sodium: 136 mmol/L (ref 135–145)
Total Bilirubin: 0.9 mg/dL (ref 0.3–1.2)
Total Protein: 6.9 g/dL (ref 6.5–8.1)

## 2020-09-26 ENCOUNTER — Inpatient Hospital Stay (HOSPITAL_COMMUNITY): Payer: Medicare HMO | Admitting: Hematology

## 2020-09-26 ENCOUNTER — Other Ambulatory Visit: Payer: Self-pay

## 2020-09-26 VITALS — BP 181/82 | HR 76 | Temp 97.3°F | Resp 20 | Wt 308.4 lb

## 2020-09-26 DIAGNOSIS — C8580 Other specified types of non-Hodgkin lymphoma, unspecified site: Secondary | ICD-10-CM

## 2020-09-26 DIAGNOSIS — D696 Thrombocytopenia, unspecified: Secondary | ICD-10-CM

## 2020-09-26 DIAGNOSIS — C884 Extranodal marginal zone B-cell lymphoma of mucosa-associated lymphoid tissue [MALT-lymphoma]: Secondary | ICD-10-CM | POA: Diagnosis not present

## 2020-09-26 NOTE — Patient Instructions (Signed)
[place] at [facility] Discharge Instructions  You were seen today by Dr. Delton Coombes. He went over your recent results. The pharmacist will call you regarding the Evus[place] to boost your immunity against COVID. Dr. Delton Coombes will see you back in 4 months for labs and follow up.   Thank you for choosing [place] at [facility] to provide your oncology and hematology care.  To afford each patient quality time with our provider, please arrive at least 15 minutes before your scheduled appointment time.   If you have a lab appointment with the [facility] please come in thru the Main Entrance and check in at the main information desk  You need to re-schedule your appointment should you arrive 10 or more minutes late.  We strive to give you quality time with our providers, and arriving late affects you and other patients whose appointments are after yours.  Also, if you no show three or more times for appointments you may be dismissed from the clinic at the providers discretion.     Again, thank you for choosing [facility].  Our hope is that these requests will decrease the amount of time that you wait before being seen by our physicians.       _____________________________________________________________  Should you have questions after your visit to [facility], please contact our office at [phone] between the hours of 8:00 a.m. and 4:30 p.m.  Voicemails left after 4:00 p.m. will not be returned until the following business day.  For prescription refill requests, have your pharmacy contact our office and allow 72 hours.    Cancer Center Support Programs:   > Cancer Support Group  2nd Tuesday of the month 1pm-2pm, Journey Room

## 2020-09-26 NOTE — Progress Notes (Signed)
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, a 75 y.o. male, returns for routine follow-up for his marginal zone lymphoma and mild thrombocytopenia. [name] [name] was last seen on 09/27/2019.  Today he reports feeling okay. He is currently on oxygen since 06/2020 after getting infected with COVID and then developed pneumonia in 08/2020. He is mostly sedentary at home and is able to go to the bathroom and do his chores independently, though he requires oxygen constantly.  He is open to getting Evusheld given that he was infected with COVID previously.  REVIEW OF SYSTEMS:  Review of Systems  Constitutional: Positive for fatigue (25%). Negative for appetite change.  All other systems reviewed and are negative.   PAST MEDICAL/SURGICAL HISTORY:  Past Medical History:  Diagnosis Date  . Atrial fibrillation ([place])   . CAD (coronary artery disease)    DES to LAD June 2018  . CKD (chronic kidney disease) stage 3, GFR 30-59 ml/min (HCC)   . Essential hypertension   . History of pneumonia   . Hyperlipidemia   . Hypothyroidism   . LBBB (left bundle branch block)   . Morbid obesity ([place])   . Non Hodgkin's lymphoma ([place])    Status post XRT and chemotherapy  . NSTEMI (non-ST elevated myocardial infarction) [facility])    June 2018  . Peripheral neuropathy   . Pneumonia due to COVID-19 virus   . Sleep apnea   . Type 2 diabetes mellitus ([place])    Past Surgical History:  Procedure Laterality Date  . CHOLECYSTECTOMY  1992  . COLONOSCOPY N/A 09/03/2014   SLF:six colon polyps removed/small internal hemorrhoids  . CORONARY STENT INTERVENTION N/A  01/21/2017   Procedure: Coronary Stent Intervention;  Surgeon: Nelva Bush, MD;  Location: [facility];  Service: Cardiovascular;  Laterality: N/A;  . ESOPHAGOGASTRODUODENOSCOPY N/A 09/03/2014   SLF: mild gastritis/few gastric polyps  . LEFT HEART CATH AND CORONARY ANGIOGRAPHY N/A 01/20/2017   Procedure: Left Heart Cath and Coronary Angiography;  Surgeon: Jettie Booze, MD;  Location: [facility];  Service: Cardiovascular;  Laterality: N/A;  . TOTAL KNEE ARTHROPLASTY  11/10/2011   Procedure: TOTAL KNEE ARTHROPLASTY;  Surgeon: Mauri Pole, MD;  Location: [facility];  Service: Orthopedics;  Laterality: Right;  . TOTAL KNEE ARTHROPLASTY Left 05/24/2018   Procedure: LEFT TOTAL KNEE ARTHROPLASTY;  Surgeon: Melrose Nakayama, MD;  Location: [facility];  Service: Orthopedics;  Laterality: Left;    SOCIAL HISTORY:  Social History   Socioeconomic History  . Marital status: Divorced    Spouse name: Not on file  . Number of children: Not on file  . Years of education: Not on file  . Highest education level: Not on file  Occupational History  . Not on file  Tobacco Use  . Smoking status: Former Smoker    Packs/day: 0.25    Years: 30.00    Pack years: 7.50    Types: Cigarettes    Quit date: 10/2019    Years since quitting: 0.9  . Smokeless tobacco: Never Used  Vaping Use  . Vaping Use: Never used  Substance and Sexual Activity  . Alcohol use:  No    Alcohol/week: 0.0 standard drinks  . Drug use: No  . Sexual activity: Yes  Other Topics Concern  . Not on file  Social History Narrative  . Not on file   Social Determinants of Health   Financial Resource Strain: Not on file  Food Insecurity: Not on file  Transportation Needs: Not on file  Physical Activity: Not on file  Stress: Not on file  Social Connections: Not on file  Intimate Partner Violence: Not At Risk  . Fear of Current or Ex-Partner: No  . Emotionally Abused: No  . Physically Abused: No  . Sexually Abused:  No    FAMILY HISTORY:  Family History  Problem Relation Age of Onset  . Cancer Mother        breast and lung  . Cancer Father        bladder  . Cancer Maternal Uncle        prostate  . Cancer Paternal Uncle        esophagus  . Colon cancer Neg Hx     CURRENT MEDICATIONS:  Current Outpatient Medications  Medication Sig Dispense Refill  . acetaminophen (TYLENOL) 325 MG tablet Take 2 tablets (650 mg total) by mouth every 6 (six) hours as needed for mild pain (or Fever >/= 101). 30 tablet 1  . albuterol (VENTOLIN HFA) 108 (90 Base) MCG/ACT inhaler Inhale 1-2 puffs into the lungs every 6 (six) hours as needed for wheezing or shortness of breath. 1 Inhaler 0  . amLODipine (NORVASC) 10 MG tablet Take 10 mg by mouth daily.    Marland Kitchen apixaban (ELIQUIS) 2.5 MG TABS tablet Take 1 tablet (2.5 mg total) by mouth 2 (two) times daily. 60 tablet 6  . aspirin EC 81 MG tablet Take 81 mg by mouth daily. Swallow whole.    . bisoprolol (ZEBETA) 5 MG tablet Take 0.5 tablets by mouth daily.    . furosemide (LASIX) 80 MG tablet Take 1 tablet (80 mg total) by mouth daily. 30 tablet 1  . insulin degludec (TRESIBA) 100 UNIT/ML FlexTouch Pen Inject 56 Units into the skin daily at 10 pm.    . ipratropium-albuterol (DUONEB) 0.5-2.5 (3) MG/3ML SOLN Inhale 3 mLs into the lungs every 4 (four) hours as needed.     . isosorbide mononitrate (IMDUR) 30 MG 24 hr tablet Take 1 tablet (30 mg total) by mouth daily. 30 tablet 1  . levocetirizine (XYZAL) 5 MG tablet Take 5 mg by mouth every evening.     Marland Kitchen levothyroxine (SYNTHROID) 100 MCG tablet Take 100 mcg by mouth daily. (takes with 259mcg for a total of 332mcg)    . levothyroxine (SYNTHROID, LEVOTHROID) 200 MCG tablet Take 300 mcg by mouth daily before breakfast. (takes with 165mcg for a total of 37mcg)    . losartan (COZAAR) 100 MG tablet Take 1 tablet by mouth daily.    . nitroGLYCERIN (NITROSTAT) 0.4 MG SL tablet Place 0.4 mg under the tongue every 5 (five) minutes as  needed for chest pain.   11  . OXYGEN Inhale 3 L into the lungs daily as needed.    . potassium chloride SA (KLOR-CON) 20 MEQ tablet Take 1 tablet (20 mEq total) by mouth 2 (two) times daily. 60 tablet 1  . senna-docusate (SENOKOT-S) 8.6-50 MG tablet Take 1 tablet by mouth daily as needed.     . simvastatin (ZOCOR) 5 MG tablet Take 5 mg by mouth daily.    . SYMBICORT 160-4.5 MCG/ACT inhaler Inhale  2 puffs into the lungs daily.    . fluticasone (FLONASE) 50 MCG/ACT nasal spray Place 2 sprays into both nostrils as needed for up to 14 days.     No current facility-administered medications for this visit.    ALLERGIES:  No Known Allergies  PHYSICAL EXAM:  Performance status (ECOG): 2 - Symptomatic, <50% confined to bed  Vitals:   09/26/20 1508  BP: (!) 181/82  Pulse: 76  Resp: 20  Temp: (!) 97.3 F (36.3 C)  SpO2: 95%   Wt Readings from Last 3 Encounters:  09/26/20 (!) 308 lb 6.8 oz (139.9 kg)  09/10/20 (!) 307 lb (139.3 kg)  09/02/20 (!) 307 lb (139.3 kg)   Physical Exam Vitals reviewed.  Constitutional:      Appearance: Normal appearance. He is obese.     Interventions: Nasal cannula in place.     Comments: In wheelchair  Cardiovascular:     Rate and Rhythm: Normal rate and regular rhythm.     Pulses: Normal pulses.     Heart sounds: Normal heart sounds.  Pulmonary:     Effort: Pulmonary effort is normal.     Breath sounds: Normal breath sounds.  Chest:  Breasts:     Right: No axillary adenopathy or supraclavicular adenopathy.     Left: No axillary adenopathy or supraclavicular adenopathy.    Abdominal:     Palpations: Abdomen is soft. There is no mass.     Tenderness: There is no abdominal tenderness.     Hernia: No hernia is present.  Musculoskeletal:     Right lower leg: No edema.     Left lower leg: No edema.  Lymphadenopathy:     Cervical: No cervical adenopathy.     Right cervical: No superficial or posterior cervical adenopathy.    Left cervical: No  superficial or posterior cervical adenopathy.     Upper Body:     Right upper body: No supraclavicular, axillary or pectoral adenopathy.     Left upper body: No supraclavicular, axillary or pectoral adenopathy.     Lower Body: No right inguinal adenopathy. No left inguinal adenopathy.  Neurological:     General: No focal deficit present.     Mental Status: He is alert and oriented to person, place, and time.  Psychiatric:        Mood and Affect: Mood normal.        Behavior: Behavior normal.     LABORATORY DATA:  I have reviewed the labs as listed.  CBC Latest Ref Rng & Units 09/19/2020 08/04/2020 07/20/2020  WBC 4.0 - 10.5 K/uL 6.3 5.9 5.7  Hemoglobin 13.0 - 17.0 g/dL 15.3 15.0 15.8  Hematocrit 39.0 - 52.0 % 47.7 48.3 49.8  Platelets 150 - 400 K/uL 134(L) 122(L) 111(L)   CMP Latest Ref Rng & Units 09/19/2020 08/10/2020 08/09/2020  Glucose 70 - 99 mg/dL 165(H) 104(H) 74  BUN 8 - 23 mg/dL 25(H) 40(H) 46(H)  Creatinine 0.61 - 1.24 mg/dL 2.49(H) 2.37(H) 2.42(H)  Sodium 135 - 145 mmol/L 136 142 142  Potassium 3.5 - 5.1 mmol/L 3.6 3.6 3.4(L)  Chloride 98 - 111 mmol/L 103 101 103  CO2 22 - 32 mmol/L 24 33(H) 29  Calcium 8.9 - 10.3 mg/dL 8.7(L) 8.3(L) 8.1(L)  Total Protein 6.5 - 8.1 g/dL 6.9 - -  Total Bilirubin 0.3 - 1.2 mg/dL 0.9 - -  Alkaline Phos 38 - 126 U/L 67 - -  AST 15 - 41 U/L 12(L) - -  ALT  0 - 44 U/L 10 - -      Component Value Date/Time   RBC 5.25 09/19/2020 1256   MCV 90.9 09/19/2020 1256   MCH 29.1 09/19/2020 1256   MCHC 32.1 09/19/2020 1256   RDW 15.1 09/19/2020 1256   LYMPHSABS 1.7 09/19/2020 1256   MONOABS 0.5 09/19/2020 1256   EOSABS 0.2 09/19/2020 1256   BASOSABS 0.1 09/19/2020 1256   Lab Results  Component Value Date   LDH 130 09/19/2020   LDH 226 (H) 06/24/2020   LDH 128 09/26/2019    DIAGNOSTIC IMAGING:  I have independently reviewed the scans and discussed with the patient. No results found.   ASSESSMENT:  1.  Stage II marginal zone lymphoma: -He  does not report any B symptoms. -I reviewed his labs including LDH which was normal. -I have independently reviewed the CT scan of the abdomen and pelvis from 08/01/2019 which showed numerous prominent and enlarged mediastinal, retroperitoneal, iliac and inguinal lymph nodes unchanged from prior PET scan from January 2019.  Unchanged splenomegaly measuring 16.9 cm.  Unchanged thickening and tenting of the left aspect of the bladder dome.  Stable small left pleural effusion. -Labs from 09/26/2019 reviewed by me shows normal LDH at 128.  Hemoglobin and white count was normal.  2.  Mild thrombocytopenia: -Platelet count on CBC from 09/26/2019 was 137.  He denies any easy bruising or bleeding. -He had mild thrombocytopenia for many years likely from splenomegaly.  3.  CKD: -His creatinine is 2.26 and at his baseline.   PLAN:  1.  Stage II marginal zone lymphoma: -Does not have any B symptoms. -However he had Covid pneumonia in November and followed by bacterial pneumonia again in December. -No clearly palpable adenopathy or splenomegaly. -Reviewed labs which showed normal LDH, CBC and LFTs. -Recommend follow-up in 4 months with repeat labs.  Due to his immunocompromise state, I have also recommended to quantitative immunoglobulins. -Because of his high risk and immune depletion from previous rituximab treatments, I have recommended Evusheld injections if he qualifies.  2.  Mild thrombocytopenia: -Mild thrombocytopenia stable with platelet count around 134.  3.  CKD: -CKD is also stable with creatinine 2.4.   Orders placed this encounter:  Orders Placed This Encounter  Procedures  . CBC with Differential/Platelet  . Comprehensive metabolic panel  . Lactate dehydrogenase  . IgG, IgA, IgM     Derek Jack, MD [facility] [phone]   I, Milinda Antis, am acting as a scribe for Dr. Sanda Linger.  I, Derek Jack MD, have reviewed the above  documentation for accuracy and completeness, and I agree with the above.

## 2020-09-27 NOTE — Addendum Note (Signed)
Addended by: Derek Jack on: 09/27/2020 09:56 AM   Modules accepted: Orders

## 2020-09-30 ENCOUNTER — Other Ambulatory Visit (HOSPITAL_COMMUNITY)
Admission: RE | Admit: 2020-09-30 | Discharge: 2020-09-30 | Disposition: A | Discharge status: Home / Self Care | Payer: Medicare HMO | Source: Ambulatory Visit | Attending: Internal Medicine | Admitting: Internal Medicine

## 2020-09-30 ENCOUNTER — Telehealth: Payer: Self-pay | Admitting: Internal Medicine

## 2020-09-30 NOTE — Telephone Encounter (Signed)
I called [facility] & left vm for them to call pt to reschedule.  Called pt & made him aware [facility] schedules the pft's at [facility] and gave him their phone #.  Nothing further needed.

## 2020-10-01 ENCOUNTER — Ambulatory Visit (HOSPITAL_COMMUNITY): Payer: Medicare HMO

## 2020-10-06 ENCOUNTER — Encounter (HOSPITAL_COMMUNITY): Payer: Self-pay

## 2020-10-06 ENCOUNTER — Emergency Department (HOSPITAL_COMMUNITY): Payer: Medicare HMO

## 2020-10-06 ENCOUNTER — Other Ambulatory Visit: Payer: Self-pay

## 2020-10-06 ENCOUNTER — Inpatient Hospital Stay (HOSPITAL_COMMUNITY)
Admission: EM | Admit: 2020-10-06 | Discharge: 2020-10-11 | DRG: 291 | Disposition: A | Discharge status: Home Health | Payer: Medicare HMO | Attending: Internal Medicine | Admitting: Internal Medicine

## 2020-10-06 DIAGNOSIS — I5033 Acute on chronic diastolic (congestive) heart failure: Secondary | ICD-10-CM | POA: Diagnosis present

## 2020-10-06 DIAGNOSIS — N179 Acute kidney failure, unspecified: Secondary | ICD-10-CM | POA: Diagnosis not present

## 2020-10-06 DIAGNOSIS — I480 Paroxysmal atrial fibrillation: Secondary | ICD-10-CM | POA: Diagnosis present

## 2020-10-06 DIAGNOSIS — Z7951 Long term (current) use of inhaled steroids: Secondary | ICD-10-CM

## 2020-10-06 DIAGNOSIS — E1121 Type 2 diabetes mellitus with diabetic nephropathy: Secondary | ICD-10-CM | POA: Diagnosis not present

## 2020-10-06 DIAGNOSIS — I130 Hypertensive heart and chronic kidney disease with heart failure and stage 1 through stage 4 chronic kidney disease, or unspecified chronic kidney disease: Secondary | ICD-10-CM | POA: Diagnosis present

## 2020-10-06 DIAGNOSIS — E785 Hyperlipidemia, unspecified: Secondary | ICD-10-CM | POA: Diagnosis present

## 2020-10-06 DIAGNOSIS — I252 Old myocardial infarction: Secondary | ICD-10-CM | POA: Diagnosis not present

## 2020-10-06 DIAGNOSIS — Z8572 Personal history of non-Hodgkin lymphomas: Secondary | ICD-10-CM

## 2020-10-06 DIAGNOSIS — Z87891 Personal history of nicotine dependence: Secondary | ICD-10-CM | POA: Diagnosis not present

## 2020-10-06 DIAGNOSIS — T502X5A Adverse effect of carbonic-anhydrase inhibitors, benzothiadiazides and other diuretics, initial encounter: Secondary | ICD-10-CM | POA: Diagnosis not present

## 2020-10-06 DIAGNOSIS — Z8616 Personal history of COVID-19: Secondary | ICD-10-CM

## 2020-10-06 DIAGNOSIS — Z7901 Long term (current) use of anticoagulants: Secondary | ICD-10-CM

## 2020-10-06 DIAGNOSIS — N1832 Chronic kidney disease, stage 3b: Secondary | ICD-10-CM | POA: Diagnosis present

## 2020-10-06 DIAGNOSIS — Z9049 Acquired absence of other specified parts of digestive tract: Secondary | ICD-10-CM | POA: Diagnosis not present

## 2020-10-06 DIAGNOSIS — D696 Thrombocytopenia, unspecified: Secondary | ICD-10-CM | POA: Diagnosis present

## 2020-10-06 DIAGNOSIS — E039 Hypothyroidism, unspecified: Secondary | ICD-10-CM | POA: Diagnosis present

## 2020-10-06 DIAGNOSIS — Z923 Personal history of irradiation: Secondary | ICD-10-CM | POA: Diagnosis not present

## 2020-10-06 DIAGNOSIS — Z79899 Other long term (current) drug therapy: Secondary | ICD-10-CM

## 2020-10-06 DIAGNOSIS — R0602 Shortness of breath: Secondary | ICD-10-CM | POA: Diagnosis not present

## 2020-10-06 DIAGNOSIS — Y92239 Unspecified place in hospital as the place of occurrence of the external cause: Secondary | ICD-10-CM | POA: Diagnosis not present

## 2020-10-06 DIAGNOSIS — E1122 Type 2 diabetes mellitus with diabetic chronic kidney disease: Secondary | ICD-10-CM | POA: Diagnosis present

## 2020-10-06 DIAGNOSIS — Z955 Presence of coronary angioplasty implant and graft: Secondary | ICD-10-CM | POA: Diagnosis not present

## 2020-10-06 DIAGNOSIS — Z7982 Long term (current) use of aspirin: Secondary | ICD-10-CM

## 2020-10-06 DIAGNOSIS — I509 Heart failure, unspecified: Secondary | ICD-10-CM

## 2020-10-06 DIAGNOSIS — Z9221 Personal history of antineoplastic chemotherapy: Secondary | ICD-10-CM | POA: Diagnosis not present

## 2020-10-06 DIAGNOSIS — I2510 Atherosclerotic heart disease of native coronary artery without angina pectoris: Secondary | ICD-10-CM | POA: Diagnosis present

## 2020-10-06 DIAGNOSIS — Z6841 Body Mass Index (BMI) 40.0 and over, adult: Secondary | ICD-10-CM | POA: Diagnosis not present

## 2020-10-06 DIAGNOSIS — E1142 Type 2 diabetes mellitus with diabetic polyneuropathy: Secondary | ICD-10-CM | POA: Diagnosis present

## 2020-10-06 DIAGNOSIS — J449 Chronic obstructive pulmonary disease, unspecified: Secondary | ICD-10-CM | POA: Diagnosis present

## 2020-10-06 DIAGNOSIS — Z79890 Hormone replacement therapy (postmenopausal): Secondary | ICD-10-CM

## 2020-10-06 DIAGNOSIS — G4730 Sleep apnea, unspecified: Secondary | ICD-10-CM | POA: Diagnosis present

## 2020-10-06 DIAGNOSIS — Z20822 Contact with and (suspected) exposure to covid-19: Secondary | ICD-10-CM | POA: Diagnosis present

## 2020-10-06 DIAGNOSIS — Z96653 Presence of artificial knee joint, bilateral: Secondary | ICD-10-CM | POA: Diagnosis present

## 2020-10-06 DIAGNOSIS — I447 Left bundle-branch block, unspecified: Secondary | ICD-10-CM | POA: Diagnosis present

## 2020-10-06 DIAGNOSIS — Z809 Family history of malignant neoplasm, unspecified: Secondary | ICD-10-CM

## 2020-10-06 DIAGNOSIS — Z794 Long term (current) use of insulin: Secondary | ICD-10-CM

## 2020-10-06 DIAGNOSIS — Z9981 Dependence on supplemental oxygen: Secondary | ICD-10-CM

## 2020-10-06 LAB — GLUCOSE, CAPILLARY
Glucose-Capillary: 83 mg/dL (ref 70–99)
Glucose-Capillary: 145 mg/dL — ABNORMAL HIGH (ref 70–99)
Glucose-Capillary: 129 mg/dL — ABNORMAL HIGH (ref 70–99)

## 2020-10-06 LAB — COMPREHENSIVE METABOLIC PANEL
ALT: 10 U/L (ref 0–44)
AST: 10 U/L — ABNORMAL LOW (ref 15–41)
Albumin: 4.4 g/dL (ref 3.5–5.0)
Alkaline Phosphatase: 73 U/L (ref 38–126)
Anion gap: 12 (ref 5–15)
BUN: 30 mg/dL — ABNORMAL HIGH (ref 8–23)
CO2: 25 mmol/L (ref 22–32)
Calcium: 9.1 mg/dL (ref 8.9–10.3)
Chloride: 104 mmol/L (ref 98–111)
Creatinine, Ser: 2.66 mg/dL — ABNORMAL HIGH (ref 0.61–1.24)
GFR, Estimated: 25 mL/min — ABNORMAL LOW (ref >60)
Glucose, Bld: 91 mg/dL (ref 70–99)
Potassium: 3.9 mmol/L (ref 3.5–5.1)
Sodium: 141 mmol/L (ref 135–145)
Total Bilirubin: 1.4 mg/dL — ABNORMAL HIGH (ref 0.3–1.2)
Total Protein: 7.6 g/dL (ref 6.5–8.1)

## 2020-10-06 LAB — CBC
HCT: 49.9 % (ref 39.0–52.0)
Hemoglobin: 15.7 g/dL (ref 13.0–17.0)
MCH: 28.9 pg (ref 26.0–34.0)
MCHC: 31.5 g/dL (ref 30.0–36.0)
MCV: 91.9 fL (ref 80.0–100.0)
Platelets: 125 10*3/uL — ABNORMAL LOW (ref 150–400)
RBC: 5.43 MIL/uL (ref 4.22–5.81)
RDW: 14.9 % (ref 11.5–15.5)
WBC: 6 10*3/uL (ref 4.0–10.5)
nRBC: 0 % (ref 0.0–0.2)

## 2020-10-06 LAB — RESP PANEL BY RT-PCR (FLU A&B, COVID) ARPGX2
Influenza A by PCR: NEGATIVE
Influenza B by PCR: NEGATIVE
SARS Coronavirus 2 by RT PCR: NEGATIVE

## 2020-10-06 LAB — BLOOD GAS, VENOUS
Acid-Base Excess: 1.4 mmol/L (ref 0.0–2.0)
Bicarbonate: 23.6 mmol/L (ref 20.0–28.0)
FIO2: 32
O2 Saturation: 56.2 %
Patient temperature: 36.5
pCO2, Ven: 50.4 mmHg (ref 44.0–60.0)
pH, Ven: 7.341 (ref 7.250–7.430)
pO2, Ven: 32 mmHg (ref 32.0–45.0)

## 2020-10-06 LAB — PROTIME-INR
INR: 1.2 (ref 0.8–1.2)
Prothrombin Time: 14.7 seconds (ref 11.4–15.2)

## 2020-10-06 LAB — TSH: TSH: 1.681 u[IU]/mL (ref 0.350–4.500)

## 2020-10-06 LAB — MAGNESIUM: Magnesium: 2.3 mg/dL (ref 1.7–2.4)

## 2020-10-06 LAB — D-DIMER, QUANTITATIVE: D-Dimer, Quant: 0.92 ug/mL-FEU — ABNORMAL HIGH (ref 0.00–0.50)

## 2020-10-06 LAB — BRAIN NATRIURETIC PEPTIDE: B Natriuretic Peptide: 170 pg/mL — ABNORMAL HIGH (ref 0.0–100.0)

## 2020-10-06 MED ORDER — INSULIN ASPART 100 UNIT/ML ~~LOC~~ SOLN
0.0000 [IU] | Freq: Three times a day (TID) | SUBCUTANEOUS | Status: DC
  Administered 2020-10-06 – 2020-10-11 (×7): 1 [IU] via SUBCUTANEOUS

## 2020-10-06 MED ORDER — SENNOSIDES-DOCUSATE SODIUM 8.6-50 MG PO TABS: 1.0000 | ORAL_TABLET | Freq: Every day | ORAL | Status: DC | PRN

## 2020-10-06 MED ORDER — LEVOTHYROXINE SODIUM 50 MCG PO TABS: 100.0000 ug | ORAL_TABLET | Freq: Every day | ORAL | Status: DC

## 2020-10-06 MED ORDER — ALBUTEROL SULFATE HFA 108 (90 BASE) MCG/ACT IN AERS: 1.0000 | INHALATION_SPRAY | Freq: Four times a day (QID) | RESPIRATORY_TRACT | Status: DC | PRN

## 2020-10-06 MED ORDER — FLUTICASONE FUROATE-VILANTEROL 200-25 MCG/INH IN AEPB
1.0000 | INHALATION_SPRAY | Freq: Every day | RESPIRATORY_TRACT | Status: DC
  Administered 2020-10-07 – 2020-10-11 (×5): 1 via RESPIRATORY_TRACT
  Filled 2020-10-06: qty 28

## 2020-10-06 MED ORDER — ISOSORBIDE MONONITRATE ER 60 MG PO TB24
30.0000 mg | ORAL_TABLET | Freq: Every day | ORAL | Status: DC
  Administered 2020-10-07 – 2020-10-08 (×2): 30 mg via ORAL
  Filled 2020-10-06 (×3): qty 1

## 2020-10-06 MED ORDER — LEVOTHYROXINE SODIUM 100 MCG PO TABS
300.0000 ug | ORAL_TABLET | Freq: Every day | ORAL | Status: DC
  Administered 2020-10-07 – 2020-10-11 (×5): 300 ug via ORAL
  Filled 2020-10-06 (×6): qty 3

## 2020-10-06 MED ORDER — BISOPROLOL FUMARATE 5 MG PO TABS
2.5000 mg | ORAL_TABLET | Freq: Every day | ORAL | Status: DC
  Administered 2020-10-07 – 2020-10-09 (×3): 2.5 mg via ORAL
  Filled 2020-10-06 (×3): qty 1

## 2020-10-06 MED ORDER — LEVOCETIRIZINE DIHYDROCHLORIDE 5 MG PO TABS: 5.0000 mg | ORAL_TABLET | Freq: Every evening | ORAL | Status: DC

## 2020-10-06 MED ORDER — FUROSEMIDE 10 MG/ML IJ SOLN
80.0000 mg | Freq: Once | INTRAMUSCULAR | Status: AC
  Administered 2020-10-06: 80 mg via INTRAVENOUS
  Filled 2020-10-06: qty 8

## 2020-10-06 MED ORDER — FUROSEMIDE 10 MG/ML IJ SOLN
60.0000 mg | Freq: Two times a day (BID) | INTRAMUSCULAR | Status: DC
  Administered 2020-10-07 – 2020-10-08 (×3): 60 mg via INTRAVENOUS
  Filled 2020-10-06 (×3): qty 6

## 2020-10-06 MED ORDER — SIMVASTATIN 10 MG PO TABS
5.0000 mg | ORAL_TABLET | Freq: Every day | ORAL | Status: DC
  Administered 2020-10-07 – 2020-10-10 (×4): 5 mg via ORAL
  Filled 2020-10-06 (×4): qty 1

## 2020-10-06 MED ORDER — SODIUM CHLORIDE 0.9 % IV SOLN: 250.0000 mL | INTRAVENOUS | Status: DC | PRN

## 2020-10-06 MED ORDER — ASPIRIN EC 81 MG PO TBEC
81.0000 mg | DELAYED_RELEASE_TABLET | Freq: Every day | ORAL | Status: DC
  Administered 2020-10-07 – 2020-10-11 (×5): 81 mg via ORAL
  Filled 2020-10-06 (×5): qty 1

## 2020-10-06 MED ORDER — SODIUM CHLORIDE 0.9% FLUSH: 3.0000 mL | INTRAVENOUS | Status: DC | PRN

## 2020-10-06 MED ORDER — IPRATROPIUM-ALBUTEROL 0.5-2.5 (3) MG/3ML IN SOLN: 3.0000 mL | RESPIRATORY_TRACT | Status: DC | PRN

## 2020-10-06 MED ORDER — ACETAMINOPHEN 325 MG PO TABS: 650.0000 mg | ORAL_TABLET | ORAL | Status: DC | PRN

## 2020-10-06 MED ORDER — METOLAZONE 5 MG PO TABS
2.5000 mg | ORAL_TABLET | Freq: Every day | ORAL | Status: AC
  Administered 2020-10-06 – 2020-10-07 (×2): 2.5 mg via ORAL
  Filled 2020-10-06 (×2): qty 1

## 2020-10-06 MED ORDER — LORATADINE 10 MG PO TABS
10.0000 mg | ORAL_TABLET | Freq: Every day | ORAL | Status: DC
  Administered 2020-10-07 – 2020-10-11 (×5): 10 mg via ORAL
  Filled 2020-10-06 (×5): qty 1

## 2020-10-06 MED ORDER — FLUTICASONE PROPIONATE 50 MCG/ACT NA SUSP
2.0000 | NASAL | Status: DC | PRN
  Filled 2020-10-06: qty 16

## 2020-10-06 MED ORDER — SODIUM CHLORIDE 0.9% FLUSH
3.0000 mL | Freq: Two times a day (BID) | INTRAVENOUS | Status: DC
  Administered 2020-10-06 – 2020-10-11 (×9): 3 mL via INTRAVENOUS

## 2020-10-06 MED ORDER — INSULIN DETEMIR 100 UNIT/ML ~~LOC~~ SOLN
7.0000 [IU] | Freq: Every day | SUBCUTANEOUS | Status: DC
  Administered 2020-10-06 – 2020-10-09 (×4): 7 [IU] via SUBCUTANEOUS
  Filled 2020-10-06 (×4): qty 0.07

## 2020-10-06 MED ORDER — APIXABAN 2.5 MG PO TABS
2.5000 mg | ORAL_TABLET | Freq: Two times a day (BID) | ORAL | Status: DC
  Administered 2020-10-06 – 2020-10-11 (×10): 2.5 mg via ORAL
  Filled 2020-10-06 (×10): qty 1

## 2020-10-06 MED ORDER — ONDANSETRON HCL 4 MG/2ML IJ SOLN
4.0000 mg | Freq: Four times a day (QID) | INTRAMUSCULAR | Status: DC | PRN
  Administered 2020-10-08: 4 mg via INTRAVENOUS
  Filled 2020-10-06: qty 2

## 2020-10-06 MED ORDER — INSULIN ASPART 100 UNIT/ML ~~LOC~~ SOLN: 0.0000 [IU] | Freq: Every day | SUBCUTANEOUS | Status: DC

## 2020-10-06 NOTE — Progress Notes (Signed)
Pt arrived to room #323 via [place] from ED. Ambulatory to bed without difficulty or assist. Oriented to room and safety procedures. States understanding.

## 2020-10-06 NOTE — ED Notes (Signed)
Pt accidentally removed his nasal cannula. sats were 74%. Pt placed back on O2 @ 5L. sats are now back up to 92% on 4L.

## 2020-10-06 NOTE — ED Notes (Signed)
ED TO INPATIENT HANDOFF REPORT  ED Nurse Name and Phone #: [phone]  S Name/Age/Gender [name] [name] 75 y.o. male Room/Bed: APA07/APA07  Code Status   Code Status: Full Code  Home/SNF/Other Home Patient oriented to: self and place Is this baseline? Yes   Triage Complete: Triage complete  Chief Complaint Acute on chronic diastolic HF (heart failure) ([place]) [I50.33]  Triage Note Pt brought to ED via [facility] for increased SOB, pt states missed a couple doses of Lasix, normally wears 3L O2, Pt states he has had increased swelling in his legs since yesterday.     Allergies No Known Allergies  Level of Care/Admitting Diagnosis ED Disposition    ED Disposition Condition [facility]: [facility] [[id number]]  Level of Care: Telemetry [5]  Covid Evaluation: Asymptomatic Screening Protocol (No Symptoms)  Diagnosis: Acute on chronic diastolic HF (heart failure) ([place]) [209470]  Admitting Physician: Barton Dubois [[id number]]  Attending Physician: Barton Dubois [[id number]]  Estimated length of stay: past midnight tomorrow  Certification:: I certify this patient will need inpatient services for at least 2 midnights       B Medical/Surgery History Past Medical History:  Diagnosis Date  . Atrial fibrillation ([place])   . CAD (coronary artery disease)    DES to LAD June 2018  . CKD (chronic kidney disease) stage 3, GFR 30-59 ml/min (HCC)   . Essential hypertension   . History of pneumonia   . Hyperlipidemia   . Hypothyroidism   . LBBB (left bundle branch block)   . Morbid obesity ([place])   . Non Hodgkin's lymphoma ([place])    Status post XRT and chemotherapy  . NSTEMI (non-ST elevated myocardial infarction) [facility])    June 2018  . Peripheral neuropathy   . Pneumonia due to COVID-19 virus   . Sleep apnea   . Type 2 diabetes mellitus ([place])    Past Surgical History:  Procedure Laterality Date  . CHOLECYSTECTOMY  1992  . COLONOSCOPY N/A 09/03/2014   SLF:six colon  polyps removed/small internal hemorrhoids  . CORONARY STENT INTERVENTION N/A 01/21/2017   Procedure: Coronary Stent Intervention;  Surgeon: Nelva Bush, MD;  Location: [facility];  Service: Cardiovascular;  Laterality: N/A;  . ESOPHAGOGASTRODUODENOSCOPY N/A 09/03/2014   SLF: mild gastritis/few gastric polyps  . LEFT HEART CATH AND CORONARY ANGIOGRAPHY N/A 01/20/2017   Procedure: Left Heart Cath and Coronary Angiography;  Surgeon: Jettie Booze, MD;  Location: [facility];  Service: Cardiovascular;  Laterality: N/A;  . TOTAL KNEE ARTHROPLASTY  11/10/2011   Procedure: TOTAL KNEE ARTHROPLASTY;  Surgeon: Mauri Pole, MD;  Location[facility];  Service: Orthopedics;  Laterality: Right;  . TOTAL KNEE ARTHROPLASTY Left 05/24/2018   Procedure: LEFT TOTAL KNEE ARTHROPLASTY;  Surgeon: Melrose Nakayama, MD;  Location: [facility];  Service: Orthopedics;  Laterality: Left;     A IV Location/Drains/Wounds Patient Lines/Drains/Airways Status    Active Line/Drains/Airways    Name Placement date Placement time Site Days   Peripheral IV 10/06/20 Right Antecubital 10/06/20  1146  Antecubital  less than 1          Intake/Output Last 24 hours  Intake/Output Summary (Last 24 hours) at 10/06/2020 1616 Last data filed at 10/06/2020 1611 Gross per 24 hour  Intake -  Output 1200 ml  Net -1200 ml    Labs/Imaging Results for orders placed or performed during the hospital encounter of 10/06/20 (from the past 48 hour(s))  CBC  Status: Abnormal   Collection Time: 10/06/20 12:01 PM  Result Value Ref Range   WBC 6.0 4.0 - 10.5 K/uL   RBC 5.43 4.22 - 5.81 MIL/uL   Hemoglobin 15.7 13.0 - 17.0 g/dL   HCT 49.9 39.0 - 52.0 %   MCV 91.9 80.0 - 100.0 fL   MCH 28.9 26.0 - 34.0 pg   MCHC 31.5 30.0 - 36.0 g/dL   RDW 14.9 11.5 - 15.5 %   Platelets 125 (L) 150 - 400 K/uL    Comment: Immature Platelet Fraction may be clinically indicated, consider ordering this additional test [id number]    nRBC  0.0 0.0 - 0.2 %    Comment: Performed at [facility], [address] [place], [place] [address]  Comprehensive metabolic panel     Status: Abnormal   Collection Time: 10/06/20 12:01 PM  Result Value Ref Range   Sodium 141 135 - 145 mmol/L   Potassium 3.9 3.5 - 5.1 mmol/L   Chloride 104 98 - 111 mmol/L   CO2 25 22 - 32 mmol/L   Glucose, Bld 91 70 - 99 mg/dL    Comment: Glucose reference range applies only to samples taken after fasting for at least 8 hours.   BUN 30 (H) 8 - 23 mg/dL   Creatinine, Ser 2.66 (H) 0.61 - 1.24 mg/dL   Calcium 9.1 8.9 - 10.3 mg/dL   Total Protein 7.6 6.5 - 8.1 g/dL   Albumin 4.4 3.5 - 5.0 g/dL   AST 10 (L) 15 - 41 U/L   ALT 10 0 - 44 U/L   Alkaline Phosphatase 73 38 - 126 U/L   Total Bilirubin 1.4 (H) 0.3 - 1.2 mg/dL   GFR, Estimated 25 (L) >60 mL/min    Comment: (NOTE) Calculated using the CKD-EPI Creatinine Equation (2021)    Anion gap 12 5 - 15    Comment: Performed at [facility], [address],  [place], [place] [address]  Brain natriuretic peptide     Status: Abnormal   Collection Time: 10/06/20 12:01 PM  Result Value Ref Range   B Natriuretic Peptide 170.0 (H) 0.0 - 100.0 pg/mL    Comment: Performed at [facility], [address] [place], [place] [address]  Protime-INR     Status: None   Collection Time: 10/06/20 12:01 PM  Result Value Ref Range   Prothrombin Time 14.7 11.4 - 15.2 seconds   INR 1.2 0.8 - 1.2    Comment: (NOTE) INR goal varies based on device and disease states. Performed at [facility], [address], [place], [place] [address]   D-dimer, quantitative     Status: Abnormal   Collection Time: 10/06/20 12:01 PM  Result Value Ref Range   D-Dimer, Quant 0.92 (H) 0.00 - 0.50 ug/mL-FEU    Comment: (NOTE) At the manufacturer cut-off value of 0.5 g/mL FEU, this assay has a negative predictive value of 95-100%.This assay is intended for use in conjunction with a clinical pretest probability (PTP) assessment model to exclude  pulmonary embolism (PE) and deep venous thrombosis (DVT) in outpatients suspected of PE or DVT. Results should be correlated with clinical presentation. Performed at [facility], [address] [place], [place] [address]   Magnesium     Status: None   Collection Time: 10/06/20 12:01 PM  Result Value Ref Range   Magnesium 2.3 1.7 - 2.4 mg/dL    Comment: Performed at [facility], [address] [place],  [address]  TSH  Status: None   Collection Time: 10/06/20 12:01 PM  Result Value Ref Range   TSH 1.681 0.350 - 4.500 uIU/mL    Comment: Performed by a 3rd Generation assay with a functional sensitivity of <=0.01 uIU/mL. Performed at [facility], [address] [place], [place] [address]   Blood gas, venous (at [facility] and [facility], not at [facility])     Status: None   Collection Time: 10/06/20 12:18 PM  Result Value Ref Range   FIO2 32.00    pH, Ven 7.341 7.250 - 7.430   pCO2, Ven 50.4 44.0 - 60.0 mmHg   pO2, Ven 32.0 32.0 - 45.0 mmHg   Bicarbonate 23.6 20.0 - 28.0 mmol/L   Acid-Base Excess 1.4 0.0 - 2.0 mmol/L   O2 Saturation 56.2 %   Patient temperature 36.5     Comment: Performed at [facility], [address], [place], [place] [address]   DG Chest Port 1 View  Result Date: 10/06/2020 CLINICAL DATA:  Shortness of breath.  Swelling in both legs. EXAM: PORTABLE CHEST 1 VIEW COMPARISON:  August 04, 2020 FINDINGS: No pneumothorax. Stable cardiomegaly. The hila and mediastinum are unremarkable. Increased interstitial opacities in the lungs, right greater than left. No other acute abnormalities. IMPRESSION: Findings are most consistent with cardiomegaly and mildly asymmetric pulmonary edema. Electronically Signed   By: Dorise Bullion III M.D   On: 10/06/2020 12:53    Pending Labs Unresulted Labs (From admission, onward)          Start     Ordered   10/07/20 2831  Basic metabolic panel  Daily,   R      10/06/20 1402   10/06/20 1205  Resp Panel by RT-PCR (Flu A&B, Covid) Nasopharyngeal  Swab  (Tier 2 - Symptomatic/asymptomatic with Precautions )  Once,   STAT       Question Answer Comment  Is this test for diagnosis or screening Diagnosis of ill patient   Symptomatic for COVID-19 as defined by CDC Yes   Date of Symptom Onset 10/05/2020   Hospitalized for COVID-19 Yes   Admitted to ICU for COVID-19 No   Previously tested for COVID-19 Yes   Resident in a congregate (group) care setting No   Employed in healthcare setting No   Has patient completed COVID vaccination(s) (2 doses of Pfizer/Moderna 1 dose of [employer]) Unknown      10/06/20 1204          Vitals/Pain Today's Vitals   10/06/20 1330 10/06/20 1352 10/06/20 1400 10/06/20 1430  BP: 140/81  124/81 (!) 144/72  Pulse: 63  70 (!) 52  Resp: 16  16 16   Temp:      TempSrc:      SpO2: (!) 89% 91% 93% 97%  Weight:      Height:      PainSc:        Isolation Precautions Airborne and Contact precautions  Medications Medications  sodium chloride flush (NS) 0.9 % injection 3 mL (has no administration in time range)  sodium chloride flush (NS) 0.9 % injection 3 mL (has no administration in time range)  0.9 %  sodium chloride infusion (has no administration in time range)  acetaminophen (TYLENOL) tablet 650 mg (has no administration in time range)  ondansetron (ZOFRAN) injection 4 mg (has no administration in time range)  furosemide (LASIX) injection 60 mg (60 mg Intravenous Not Given 10/06/20 1545)  metolazone (ZAROXOLYN) tablet 2.5 mg (has no administration in time range)  albuterol (  VENTOLIN HFA) 108 (90 Base) MCG/ACT inhaler 1-2 puff (has no administration in time range)  apixaban (ELIQUIS) tablet 2.5 mg (has no administration in time range)  aspirin EC tablet 81 mg (has no administration in time range)  bisoprolol (ZEBETA) tablet 2.5 mg (has no administration in time range)  fluticasone (FLONASE) 50 MCG/ACT nasal spray 2 spray (has no administration in time range)  levothyroxine (SYNTHROID) tablet 300  mcg (has no administration in time range)  isosorbide mononitrate (IMDUR) 24 hr tablet 30 mg (has no administration in time range)  ipratropium-albuterol (DUONEB) 0.5-2.5 (3) MG/3ML nebulizer solution 3 mL (has no administration in time range)  simvastatin (ZOCOR) tablet 5 mg (has no administration in time range)  fluticasone furoate-vilanterol (BREO ELLIPTA) 200-25 MCG/INH 1 puff (has no administration in time range)  senna-docusate (Senokot-S) tablet 1 tablet (has no administration in time range)  loratadine (CLARITIN) tablet 10 mg (has no administration in time range)  furosemide (LASIX) injection 80 mg (80 mg Intravenous Given 10/06/20 1320)    Mobility walks High fall risk   Focused Assessments    R Recommendations: See Admitting Provider Note  Report given to:   Additional Notes:

## 2020-10-06 NOTE — H&P (Signed)
History and Physical    [name] [name] MEB:[id number] DOB: 03-May-1946 DOA: 10/06/2020  PCP: [name], DO   Patient coming from: Home  I have personally briefly reviewed patient's old medical records in [place]  Chief Complaint: Shortness of breath, orthopnea, dyspnea on exertion, LE swelling and Weight gain.  HPI: [name] [name] is a 75 y.o. male with medical history significant of coronary artery disease, atrial fibrillation (chronically on Eliquis), chronic kidney disease a stage IIIb, hypertension, hyperlipidemia, hypothyroidism, morbid obesity, diastolic heart failure, type 2 diabetes mellitus with nephropathy and history of non-Hodgkin lymphoma; who presented to the hospital secondary to increased shortness of breath, orthopnea and lower extremity swelling.  Patient reports symptom has been ongoing for the last 4 to 5 days and worse in the last 2440 hrs. prior to admission.  He reports medication compliance.  No fever, no chills, no chest pain, no nausea, no vomiting, no hematuria, no melena, no hematochezia, no focal weaknesses.  ED Course: No elevation of WBCs appreciated on exam; stable chronic thrombocytopenia.Stable and at baseline chronic kidney disease a stage IIIb creatinine 2.6; elevated BNP at 170 (patient obese), normal magnesium.  Negative Covid test.  Chest x-ray demonstrating vascular congestion.  Patient received IV Lasix.  3 L nasal cannula supplementation in place.  TRH has been contacted to place patient in the hospital for further evaluation and management of acute on chronic heart failure.  Review of Systems: As per HPI otherwise all other systems reviewed and are negative.  Normal electrolytes.     Past Medical History:  Diagnosis Date  . Atrial fibrillation ([place])   . CAD (coronary artery disease)    DES to LAD June 2018  . CKD (chronic kidney disease) stage 3, GFR 30-59 ml/min (HCC)   . Essential hypertension   . History of pneumonia   .  Hyperlipidemia   . Hypothyroidism   . LBBB (left bundle branch block)   . Morbid obesity ([place])   . Non Hodgkin's lymphoma ([place])    Status post XRT and chemotherapy  . NSTEMI (non-ST elevated myocardial infarction) [facility])    June 2018  . Peripheral neuropathy   . Pneumonia due to COVID-19 virus   . Sleep apnea   . Type 2 diabetes mellitus ([place])     Past Surgical History:  Procedure Laterality Date  . CHOLECYSTECTOMY  1992  . COLONOSCOPY N/A 09/03/2014   SLF:six colon polyps removed/small internal hemorrhoids  . CORONARY STENT INTERVENTION N/A 01/21/2017   Procedure: Coronary Stent Intervention;  Surgeon: Nelva Bush, MD;  Location: [facility];  Service: Cardiovascular;  Laterality: N/A;  . ESOPHAGOGASTRODUODENOSCOPY N/A 09/03/2014   SLF: mild gastritis/few gastric polyps  . LEFT HEART CATH AND CORONARY ANGIOGRAPHY N/A 01/20/2017   Procedure: Left Heart Cath and Coronary Angiography;  Surgeon: Jettie Booze, MD;  Location: [facility];  Service: Cardiovascular;  Laterality: N/A;  . TOTAL KNEE ARTHROPLASTY  11/10/2011   Procedure: TOTAL KNEE ARTHROPLASTY;  Surgeon: Mauri Pole, MD;  Location: [facility];  Service: Orthopedics;  Laterality: Right;  . TOTAL KNEE ARTHROPLASTY Left 05/24/2018   Procedure: LEFT TOTAL KNEE ARTHROPLASTY;  Surgeon: Melrose Nakayama, MD;  Location: [facility];  Service: Orthopedics;  Laterality: Left;    Social History  reports that he quit smoking about a year ago. His smoking use included cigarettes. He has a 7.50 pack-year smoking history. He has never used smokeless tobacco. He reports that he does not drink alcohol  and does not use drugs.  No Known Allergies  Family History  Problem Relation Age of Onset  . Cancer Mother        breast and lung  . Cancer Father        bladder  . Cancer Maternal Uncle        prostate  . Cancer Paternal Uncle        esophagus  . Colon cancer Neg Hx     Prior to Admission medications   Medication Sig  Start Date End Date Taking? Authorizing Provider  acetaminophen (TYLENOL) 325 MG tablet Take 2 tablets (650 mg total) by mouth every 6 (six) hours as needed for mild pain (or Fever >/= 101). 02/01/20  Yes Emokpae, Courage, MD  albuterol (VENTOLIN HFA) 108 (90 Base) MCG/ACT inhaler Inhale 1-2 puffs into the lungs every 6 (six) hours as needed for wheezing or shortness of breath. 01/10/19  Yes Long, Wonda Olds, MD  amLODipine (NORVASC) 10 MG tablet Take 10 mg by mouth daily.   Yes [provider]  apixaban (ELIQUIS) 2.5 MG TABS tablet Take 1 tablet (2.5 mg total) by mouth 2 (two) times daily. 07/31/20  Yes Verta Ellen., NP  aspirin EC 81 MG tablet Take 81 mg by mouth daily. Swallow whole.   Yes [provider]  bisoprolol (ZEBETA) 5 MG tablet Take 0.5 tablets by mouth daily. 09/04/20  Yes [provider]  fluticasone (FLONASE) 50 MCG/ACT nasal spray Place 2 sprays into both nostrils as needed for up to 14 days. 08/10/20 08/24/20 Yes Johnson, Clanford L, MD  furosemide (LASIX) 80 MG tablet Take 1 tablet (80 mg total) by mouth daily. 08/11/20  Yes Johnson, Clanford L, MD  insulin degludec (TRESIBA) 100 UNIT/ML FlexTouch Pen Inject 56 Units into the skin daily at 10 pm.   Yes [provider]  ipratropium-albuterol (DUONEB) 0.5-2.5 (3) MG/3ML SOLN Inhale 3 mLs into the lungs every 4 (four) hours as needed (shortness of breath). 05/22/20  Yes [provider]  isosorbide mononitrate (IMDUR) 30 MG 24 hr tablet Take 1 tablet (30 mg total) by mouth daily. 04/02/17  Yes Orvan Falconer, MD  levocetirizine (XYZAL) 5 MG tablet Take 5 mg by mouth every evening.  08/27/17  Yes [provider]  levothyroxine (SYNTHROID) 100 MCG tablet Take 100 mcg by mouth daily. (takes with 244mcg for a total of 367mcg) 01/08/20  Yes [provider]  levothyroxine (SYNTHROID, LEVOTHROID) 200 MCG tablet Take 300 mcg by mouth daily before breakfast. (takes with 145mcg for a total of 330mcg)    Yes [provider]  losartan (COZAAR) 100 MG tablet Take 1 tablet by mouth daily. 09/04/20  Yes [provider]  nitroGLYCERIN (NITROSTAT) 0.4 MG SL tablet Place 0.4 mg under the tongue every 5 (five) minutes as needed for chest pain.  12/27/17  Yes [provider]  potassium chloride SA (KLOR-CON) 20 MEQ tablet Take 1 tablet (20 mEq total) by mouth 2 (two) times daily. 08/10/20  Yes Johnson, Clanford L, MD  senna-docusate (SENOKOT-S) 8.6-50 MG tablet Take 1 tablet by mouth daily as needed for mild constipation. 02/01/20  Yes [provider]  simvastatin (ZOCOR) 5 MG tablet Take 5 mg by mouth daily. 09/14/16  Yes [provider]  SYMBICORT 160-4.5 MCG/ACT inhaler Inhale 2 puffs into the lungs daily. 12/22/19  Yes [provider]  OXYGEN Inhale 3 L into the lungs daily as needed.    [provider]  Physical Exam: Vitals:   10/06/20 1400 10/06/20 1430 10/06/20 1600 10/06/20 1642  BP: 124/81 (!) 144/72 (!) 146/76 131/82  Pulse: 70 (!) 52 61 (!) 59  Resp: 16 16 17 20   Temp:    (!) 97.4 F (36.3 C)  TempSrc:    Oral  SpO2: 93% 97% 93% 94%  Weight:    (!) 138.8 kg  Height:    6\' 1"  (1.854 m)    Constitutional: Patient reports shortness of breath with activity and orthopnea.  Signs of fluid overload appreciated on exam.  No chest pain Vitals:   10/06/20 1400 10/06/20 1430 10/06/20 1600 10/06/20 1642  BP: 124/81 (!) 144/72 (!) 146/76 131/82  Pulse: 70 (!) 52 61 (!) 59  Resp: 16 16 17 20   Temp:    (!) 97.4 F (36.3 C)  TempSrc:    Oral  SpO2: 93% 97% 93% 94%  Weight:    (!) 138.8 kg  Height:    6\' 1"  (1.854 m)   Eyes: PERRL, lids and conjunctivae normal; no icterus, no nystagmus. ENMT: Mucous membranes are moist. Posterior pharynx clear of any exudate or lesions. Neck: normal, supple, no masses, no thyromegaly, no JVD appreciated on exam. Respiratory: No wheezing, positive bibasilar crackles on examination; no using accessory  muscle.  Using 3 L nasal cannula supplementation. Cardiovascular: Rate controlled, no rubs, no gallops, 1-2+ edema bilaterally. Abdomen: no tenderness, no masses palpated. No hepatosplenomegaly. Bowel sounds positive.  Musculoskeletal: no cyanosis or clubbing; no joint deformity. Skin: no petechiae. Neurologic: CN 2-12 grossly intact. Sensation intact, DTR normal. Strength 5/5 in all 4.  Psychiatric: Normal judgment and insight. Alert and oriented x 3. Normal mood.    Labs on Admission: I have personally reviewed following labs and imaging studies  CBC: Recent Labs  Lab 10/06/20 1201  WBC 6.0  HGB 15.7  HCT 49.9  MCV 91.9  PLT 125*    Basic Metabolic Panel: Recent Labs  Lab 10/06/20 1201  NA 141  K 3.9  CL 104  CO2 25  GLUCOSE 91  BUN 30*  CREATININE 2.66*  CALCIUM 9.1  MG 2.3    GFR: Estimated Creatinine Clearance: 36.2 mL/min (A) (by C-G formula based on SCr of 2.66 mg/dL (H)).  Liver Function Tests: Recent Labs  Lab 10/06/20 1201  AST 10*  ALT 10  ALKPHOS 73  BILITOT 1.4*  PROT 7.6  ALBUMIN 4.4    Urine analysis:    Component Value Date/Time   COLORURINE STRAW (A) 07/19/2020 2255   APPEARANCEUR CLEAR 07/19/2020 2255   LABSPEC 1.005 07/19/2020 2255   PHURINE 7.0 07/19/2020 2255   GLUCOSEU NEGATIVE 07/19/2020 2255   HGBUR NEGATIVE 07/19/2020 2255   [place] 07/19/2020 2255   KETONESUR NEGATIVE 07/19/2020 2255   PROTEINUR NEGATIVE 07/19/2020 2255   UROBILINOGEN 4.0 (H) 07/23/2014 1356   NITRITE NEGATIVE 07/19/2020 2255   LEUKOCYTESUR NEGATIVE 07/19/2020 2255    Radiological Exams on Admission: DG Chest Port 1 View  Result Date: 10/06/2020 CLINICAL DATA:  Shortness of breath.  Swelling in both legs. EXAM: PORTABLE CHEST 1 VIEW COMPARISON:  August 04, 2020 FINDINGS: No pneumothorax. Stable cardiomegaly. The hila and mediastinum are unremarkable. Increased interstitial opacities in the lungs, right greater than left. No other acute  abnormalities. IMPRESSION: Findings are most consistent with cardiomegaly and mildly asymmetric pulmonary edema. Electronically Signed   By: Dorise Bullion III M.D   On: 10/06/2020 12:53    EKG: Independently reviewed.  Rate controlled; no acute ischemic  changes.  Assessment/Plan 1-acute on chronic diastolic heart failure -Recent echo demonstrating ejection fraction to be preserved; no significant valvular disorder -Heart ultrasound will not be repeated -Continue IV Lasix -Low-sodium diet, daily weights and strict I's and O's -Continue metoprolol -Metolazone x2 doses will be used to enhance diuresis closely -Close monitoring of electrolytes and renal function -Follow clinical improvement.  2-chronic kidney disease stage IIIb -Appears to be at baseline -Close monitoring to be follow while actively diuresing patient -Minimize the use of contrast, hypotension and other nephrotoxic agents.  3-type 2 diabetes with nephropathy -Holding Tresiba while inpatient -Sliding scale insulin and Levemir along with modified carbohydrate diet has been started. -Will check A1c -Follow CBGs fluctuation.  4-history of non-Hodgkin's lymphoma -Continue outpatient follow-up with oncology service.  5-history of atrial fibrillation -Continue beta-blocker -Rate controlled currently -Continue Eliquis for secondary prevention.  6-essential hypertension -Appears to be stable -Continue current medication -Follow vital signs.  7-history of carotid disease -No chest pain -EKG without acute ischemic change -Continue the use of imdur, aspirin, statins and beta-blocker.  8-hyperlipidemia -Continue statins  9-hypothyroidism -Continue Synthroid -Will check TSH.  10-history of COPD -No wheezing -Continue home bronchodilators management.  11-chronic thrombocytopenia -Appears to be stable and at baseline -No overt bleeding.  12-morbid obesity -Body mass index is 40.37 kg/m. -Low calorie diet,  portion control and increase physical activity discussed with patient.  DVT prophylaxis: Chronically on Eliquis Code Status:   Full code. Family Communication:  No family at bedside Disposition Plan:   Patient is from:  Home  Anticipated DC to:  Home  Anticipated DC date:  10/09/2020  Anticipated DC barriers: Volume stabilization and further treatment for heart failure exacerbation.  Consults called:  None Admission status:  Inpatient, length of stay more than 2 midnights; telemetry bed.  Severity of Illness: Moderate severity; patient presenting with shortness of breath, orthopnea, leg swelling and weight gain.  Will need inpatient management for treatment with IV diuresis of acute on chronic diastolic heart failure  Barton Dubois MD [facility]  How to contact the [facility] Attending or Consulting provider [place] or covering provider during after hours [place], for this patient?   1. Check the care team in [facility] and look for a) attending/consulting [facility] provider listed and b) the [facility] team listed 2. Log into [email or web address] and use [place]'s universal password to access. If you do not have the password, please contact the hospital operator. 3. Locate the [facility] provider you are looking for under [facility] and page to a number that you can be directly reached. 4. If you still have difficulty reaching the provider, please page the [facility] (Director on Call) for the Hospitalists listed on amion for assistance.  10/06/2020, 5:52 PM

## 2020-10-06 NOTE — ED Notes (Signed)
Patient is resting comfortably. 

## 2020-10-06 NOTE — ED Triage Notes (Signed)
Pt brought to ED via [facility] for increased SOB, pt states missed a couple doses of Lasix, normally wears 3L O2, Pt states he has had increased swelling in his legs since yesterday.

## 2020-10-06 NOTE — ED Provider Notes (Signed)
[facility] Provider Note   CSN: [id number] Arrival date & time: 10/06/20  1121     History Chief Complaint  Patient presents with  . Shortness of Breath    [name] [name] is a 75 y.o. male.  HPI     75 year old man history of CKD, CHF, coronary artery disease, non-Hodgkin's lymphoma, COPD, type 2 diabetes presents today complaining of increasing peripheral edema and dyspnea over the past 2 weeks.  He states this became more severe yesterday.  He denies fever or productive cough.  He reports that he missed a couple doses of Lasix over the past several days.  He normally wears 2 L of oxygen but has needed to increase it to 3 L of oxygen.  He feels that there is more swelling in the left leg than the right leg.  He denies any history of PE or DVT.  He is chronically anticoagulated for A. fib.  Past Medical History:  Diagnosis Date  . Atrial fibrillation ([place])   . CAD (coronary artery disease)    DES to LAD June 2018  . CKD (chronic kidney disease) stage 3, GFR 30-59 ml/min (HCC)   . Essential hypertension   . History of pneumonia   . Hyperlipidemia   . Hypothyroidism   . LBBB (left bundle branch block)   . Morbid obesity ([place])   . Non Hodgkin's lymphoma ([place])    Status post XRT and chemotherapy  . NSTEMI (non-ST elevated myocardial infarction) [facility])    June 2018  . Peripheral neuropathy   . Pneumonia due to COVID-19 virus   . Sleep apnea   . Type 2 diabetes mellitus [facility])     Patient Active Problem List   Diagnosis Date Noted  . Chronic respiratory failure with hypoxia and hypercapnia ([place]) 09/10/2020  . Former smoker 08/10/2020  . COPD (chronic obstructive pulmonary disease) ([place]) 08/10/2020  . Acute on chronic diastolic CHF (congestive heart failure) ([place]) 08/05/2020  . Acute respiratory failure with hypoxia ([place]) 08/04/2020  . Acute exacerbation of CHF (congestive heart failure) ([place]) 07/19/2020  . AF (paroxysmal atrial fibrillation) ([place])  07/19/2020  . COVID-19   . Acute on chronic respiratory failure with hypoxia ([place]) 06/25/2020  . Aspiration pneumonia ([place]) 06/24/2020  . Pneumonia due to COVID-19 virus 06/24/2020  . Confusion   . Small bowel obstruction ([place]) 01/29/2020  . SBO (small bowel obstruction) ([place]) 01/28/2020  . Primary osteoarthritis of left knee 05/24/2018  . Primary localized osteoarthritis of left knee 05/19/2018  . Acute on chronic systolic and diastolic heart failure, NYHA class 1 ([place]) 04/01/2017  . CAD (coronary artery disease) 04/01/2017  . DOE (dyspnea on exertion) 03/21/2017  . HTN (hypertension) 03/21/2017  . NSTEMI (non-ST elevated myocardial infarction) ([place]) 01/19/2017  . NSTEMI, initial episode of care ([place]) 01/19/2017  . Sepsis ([name]) 07/24/2016  . Elevated troponin I level 07/24/2016  . Fever 07/24/2016  . Lactic acidosis 07/24/2016  . Adjustment insomnia 05/18/2016  . Erectile dysfunction 12/19/2015  . Sleep apnea 09/30/2015  . Osteoarthritis 09/30/2015  . Hypothyroidism 09/30/2015  . Hypogonadism in male 09/30/2015  . Diabetic neuropathy ([place]) 09/30/2015  . Chronic pain of left knee 09/30/2015  . Benign essential hypertension 09/30/2015  . Acute on chronic kidney failure ([place]) 05/14/2015  . Diarrhea 05/14/2015  . Dehydration 05/14/2015  . Hypokalemia 05/14/2015  . 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 Pelvic mass in male   . Varices, esophageal ([place])   . Colonic mass   .  Abnormal CT scan, pelvis   . Bladder mass   . Elevated liver enzymes   . Elevated LFTs   . Abdominal pain 07/23/2014  . Chronic kidney disease Stage IV 07/23/2014  . Morbid obesity ([place]) 07/23/2014  . Type 2 diabetes mellitus with stage 4 chronic kidney disease ([place]) 07/23/2014  . Hypertension 07/23/2014  . S/P total knee replacement, 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    Past Surgical History:  Procedure Laterality Date  . CHOLECYSTECTOMY  1992  . COLONOSCOPY N/A 09/03/2014   SLF:six colon polyps removed/small  internal hemorrhoids  . CORONARY STENT INTERVENTION N/A 01/21/2017   Procedure: Coronary Stent Intervention;  Surgeon: Nelva Bush, MD;  Location: [facility];  Service: Cardiovascular;  Laterality: N/A;  . ESOPHAGOGASTRODUODENOSCOPY N/A 09/03/2014   SLF: mild gastritis/few gastric polyps  . LEFT HEART CATH AND CORONARY ANGIOGRAPHY N/A 01/20/2017   Procedure: Left Heart Cath and Coronary Angiography;  Surgeon: Jettie Booze, MD;  Location: [facility];  Service: Cardiovascular;  Laterality: N/A;  . TOTAL KNEE ARTHROPLASTY  11/10/2011   Procedure: TOTAL KNEE ARTHROPLASTY;  Surgeon: Mauri Pole, MD;  Location: [facility];  Service: Orthopedics;  Laterality: Right;  . TOTAL KNEE ARTHROPLASTY Left 05/24/2018   Procedure: LEFT TOTAL KNEE ARTHROPLASTY;  Surgeon: Melrose Nakayama, MD;  Location: [facility];  Service: Orthopedics;  Laterality: Left;       Family History  Problem Relation Age of Onset  . Cancer Mother        breast and lung  . Cancer Father        bladder  . Cancer Maternal Uncle        prostate  . Cancer Paternal Uncle        esophagus  . Colon cancer Neg Hx     Social History   Tobacco Use  . Smoking status: Former Smoker    Packs/day: 0.25    Years: 30.00    Pack years: 7.50    Types: Cigarettes    Quit date: 10/2019    Years since quitting: 1.0  . Smokeless tobacco: Never Used  Vaping Use  . Vaping Use: Never used  Substance Use Topics  . Alcohol use: No    Alcohol/week: 0.0 standard drinks  . Drug use: No    Home Medications Prior to Admission medications   Medication Sig Start Date End Date Taking? Authorizing Provider  acetaminophen (TYLENOL) 325 MG tablet Take 2 tablets (650 mg total) by mouth every 6 (six) hours as needed for mild pain (or Fever >/= 101). 02/01/20   Roxan Hockey, MD  albuterol (VENTOLIN HFA) 108 (90 Base) MCG/ACT inhaler Inhale 1-2 puffs into the lungs every 6 (six) hours as needed for wheezing or shortness of breath.  01/10/19   Long, Wonda Olds, MD  amLODipine (NORVASC) 10 MG tablet Take 10 mg by mouth daily.    [provider]  apixaban (ELIQUIS) 2.5 MG TABS tablet Take 1 tablet (2.5 mg total) by mouth 2 (two) times daily. 07/31/20   Verta Ellen., NP  aspirin EC 81 MG tablet Take 81 mg by mouth daily. Swallow whole.    [provider]  bisoprolol (ZEBETA) 5 MG tablet Take 0.5 tablets by mouth daily. 09/04/20   [provider]  fluticasone (FLONASE) 50 MCG/ACT nasal spray Place 2 sprays into both nostrils as needed for up to 14 days. 08/10/20 08/24/20  Murlean Iba, MD  furosemide (LASIX) 80 MG tablet Take 1 tablet (80 mg total)  by mouth daily. 08/11/20   Johnson, Clanford L, MD  insulin degludec (TRESIBA) 100 UNIT/ML FlexTouch Pen Inject 56 Units into the skin daily at 10 pm.    [provider]  ipratropium-albuterol (DUONEB) 0.5-2.5 (3) MG/3ML SOLN Inhale 3 mLs into the lungs every 4 (four) hours as needed.  05/22/20   [provider]  isosorbide mononitrate (IMDUR) 30 MG 24 hr tablet Take 1 tablet (30 mg total) by mouth daily. 04/02/17   Orvan Falconer, MD  levocetirizine (XYZAL) 5 MG tablet Take 5 mg by mouth every evening.  08/27/17   [provider]  levothyroxine (SYNTHROID) 100 MCG tablet Take 100 mcg by mouth daily. (takes with 290mcg for a total of 381mcg) 01/08/20   [provider]  levothyroxine (SYNTHROID, LEVOTHROID) 200 MCG tablet Take 300 mcg by mouth daily before breakfast. (takes with 178mcg for a total of 368mcg)    [provider]  losartan (COZAAR) 100 MG tablet Take 1 tablet by mouth daily. 09/04/20   [provider]  nitroGLYCERIN (NITROSTAT) 0.4 MG SL tablet Place 0.4 mg under the tongue every 5 (five) minutes as needed for chest pain.  12/27/17   [provider]  OXYGEN Inhale 3 L into the lungs daily as needed.    [provider]  potassium chloride SA (KLOR-CON) 20 MEQ tablet Take 1 tablet (20 mEq  total) by mouth 2 (two) times daily. 08/10/20   Johnson, Clanford L, MD  senna-docusate (SENOKOT-S) 8.6-50 MG tablet Take 1 tablet by mouth daily as needed.  02/01/20   [provider]  simvastatin (ZOCOR) 5 MG tablet Take 5 mg by mouth daily. 09/14/16   [provider]  SYMBICORT 160-4.5 MCG/ACT inhaler Inhale 2 puffs into the lungs daily. 12/22/19   [provider]    Allergies    Patient has no known allergies.  Review of Systems   Review of Systems  Constitutional: Positive for fatigue.  HENT: Negative.   Eyes: Negative.   Respiratory: Positive for shortness of breath.   Cardiovascular: Positive for leg swelling.  Gastrointestinal: Positive for abdominal distention.  Endocrine: Negative.   Genitourinary: Negative.   Musculoskeletal: Negative.   Skin: Negative.   Allergic/Immunologic: Negative.   Neurological: Negative.   Hematological: Negative.   Psychiatric/Behavioral: Negative.   All other systems reviewed and are negative.   Physical Exam Updated Vital Signs BP (!) 153/105 (BP Location: Right Arm)   Pulse 72   Temp 97.8 F (36.6 C) (Oral)   Resp 15   Ht 1.854 m (6\' 1" )   Wt (!) 139.7 kg   SpO2 93%   BMI 40.64 kg/m   Physical Exam Vitals and nursing note reviewed.  Constitutional:      Comments: Patient with increased work of breathing but does not appear to be in acute distress  HENT:     Head: Normocephalic.     Mouth/Throat:     Mouth: Mucous membranes are moist.  Eyes:     Pupils: Pupils are equal, round, and reactive to light.  Cardiovascular:     Rate and Rhythm: Normal rate. Rhythm irregular.  Pulmonary:     Effort: Tachypnea present.     Breath sounds: Normal breath sounds.  Abdominal:     Palpations: Abdomen is soft.  Musculoskeletal:        General: Normal range of motion.     Cervical back: Normal range of motion.     Right lower leg: Edema present.  Left lower leg: Edema present.  Skin:    General: Skin is warm  and dry.     Capillary Refill: Capillary refill takes less than 2 seconds.  Neurological:     General: No focal deficit present.     Mental Status: He is alert.  Psychiatric:        Mood and Affect: Mood normal.        Behavior: Behavior normal.     ED Results / Procedures / Treatments   Labs (all labs ordered are listed, but only abnormal results are displayed) Labs Reviewed - No data to display  EKG EKG Interpretation  Date/Time:  Sunday October 06 2020 11:29:06 EST Ventricular Rate:  78 PR Interval:    QRS Duration: 146 QT Interval:  439 QTC Calculation: 501 R Axis:   -117 Text Interpretation: Atrial fibrillation Nonspecific IVCD with LAD Anterolateral infarct, old Confirmed by Pattricia Boss [phone]) on 10/06/2020 1:26:02 PM   Radiology DG Chest Port 1 View  Result Date: 10/06/2020 CLINICAL DATA:  Shortness of breath.  Swelling in both legs. EXAM: PORTABLE CHEST 1 VIEW COMPARISON:  August 04, 2020 FINDINGS: No pneumothorax. Stable cardiomegaly. The hila and mediastinum are unremarkable. Increased interstitial opacities in the lungs, right greater than left. No other acute abnormalities. IMPRESSION: Findings are most consistent with cardiomegaly and mildly asymmetric pulmonary edema. Electronically Signed   By: Dorise Bullion III M.D   On: 10/06/2020 12:53    Procedures .Critical Care Performed by: Pattricia Boss, MD Authorized by: Pattricia Boss, MD   Critical care provider statement:    Critical care time (minutes):  45   Critical care end time:  10/06/2020 1:50 PM   Critical care was necessary to treat or prevent imminent or life-threatening deterioration of the following conditions:  Respiratory failure   Critical care was time spent personally by me on the following activities:  Discussions with consultants, evaluation of patient's response to treatment, examination of patient, ordering and performing treatments and interventions, ordering and review of laboratory studies,  ordering and review of radiographic studies, pulse oximetry, re-evaluation of patient's condition, obtaining history from patient or surrogate and review of old charts     Medications Ordered in ED Medications - No data to display  ED Course  I have reviewed the triage vital signs and the nursing notes.  Pertinent labs & imaging results that were available during my care of the patient were reviewed by me and considered in my medical decision making (see chart for details).  Clinical Course as of 10/06/20 1351  Sun Oct 06, 2020  1351 All labs, EKG, chest x-Huck Ashworth personally reviewed and interpreted.  Radiologist interpretation also reviewed  [DR]    Clinical Course User Index [DR] Pattricia Boss, MD   MDM Rules/Calculators/A&P                          Patient presents with dyspnea c.w. chf.  Work up here with increased marking on cxr c.w. chf and pulmonary edema.  Increased oxygen demand and missed lasix doses at home  Patient being diuresed here with some improvement in respiratory status with some decreased work of breathing.  However he does continue to have increased work of breathing and increased oxygen demand.  D-dimer is slightly elevated at 0.9.  Patient has some asymmetry of leg swelling feeling that his left leg is somewhat more swollen than his right leg.  He is chronically anticoagulated.  Unable to obtain  CTA due to CKD.  Plan admission with continued diuresis, continued anticoagulation, and Doppler study of the leg when available. Discussed with Dr. Dyann Kief who will see for admission  Final Clinical Impression(s) / ED Diagnoses Final diagnoses:  SOB (shortness of breath)  Acute on chronic congestive heart failure, unspecified heart failure type [facility])    Rx / DC Orders ED Discharge Orders    None       Pattricia Boss, MD 10/06/20 1351

## 2020-10-07 ENCOUNTER — Inpatient Hospital Stay (HOSPITAL_COMMUNITY): Payer: Medicare HMO

## 2020-10-07 DIAGNOSIS — R0602 Shortness of breath: Secondary | ICD-10-CM | POA: Diagnosis not present

## 2020-10-07 DIAGNOSIS — E1121 Type 2 diabetes mellitus with diabetic nephropathy: Secondary | ICD-10-CM | POA: Diagnosis not present

## 2020-10-07 DIAGNOSIS — E785 Hyperlipidemia, unspecified: Secondary | ICD-10-CM

## 2020-10-07 DIAGNOSIS — I5033 Acute on chronic diastolic (congestive) heart failure: Secondary | ICD-10-CM | POA: Diagnosis not present

## 2020-10-07 LAB — BASIC METABOLIC PANEL
Anion gap: 10 (ref 5–15)
BUN: 37 mg/dL — ABNORMAL HIGH (ref 8–23)
CO2: 27 mmol/L (ref 22–32)
Calcium: 8.8 mg/dL — ABNORMAL LOW (ref 8.9–10.3)
Chloride: 104 mmol/L (ref 98–111)
Creatinine, Ser: 2.95 mg/dL — ABNORMAL HIGH (ref 0.61–1.24)
GFR, Estimated: 22 mL/min — ABNORMAL LOW (ref >60)
Glucose, Bld: 102 mg/dL — ABNORMAL HIGH (ref 70–99)
Potassium: 3.5 mmol/L (ref 3.5–5.1)
Sodium: 141 mmol/L (ref 135–145)

## 2020-10-07 LAB — GLUCOSE, CAPILLARY
Glucose-Capillary: 98 mg/dL (ref 70–99)
Glucose-Capillary: 119 mg/dL — ABNORMAL HIGH (ref 70–99)
Glucose-Capillary: 104 mg/dL — ABNORMAL HIGH (ref 70–99)

## 2020-10-07 LAB — HEMOGLOBIN A1C
Hgb A1c MFr Bld: 5.3 % (ref 4.8–5.6)
Mean Plasma Glucose: 105.41 mg/dL

## 2020-10-07 NOTE — Progress Notes (Signed)
PROGRESS NOTE    [name] [name]  RFF:[id number] DOB: 1945/11/09 DOA: 10/06/2020 PCP: [name], DO   Chief Complaint  Patient presents with  . Shortness of Breath    Brief admission narrative: [name] [name] is a 75 y.o. male with medical history significant of coronary artery disease, atrial fibrillation (chronically on Eliquis), chronic kidney disease a stage IIIb, hypertension, hyperlipidemia, hypothyroidism, morbid obesity, diastolic heart failure, type 2 diabetes mellitus with nephropathy and history of non-Hodgkin lymphoma; who presented to the hospital secondary to increased shortness of breath, orthopnea and lower extremity swelling.  Patient reports symptom has been ongoing for the last 4 to 5 days and worse in the last 2440 hrs. prior to admission.  He reports medication compliance.  No fever, no chills, no chest pain, no nausea, no vomiting, no hematuria, no melena, no hematochezia, no focal weaknesses.  ED Course: No elevation of WBCs appreciated on exam; stable chronic thrombocytopenia.Stable and at baseline chronic kidney disease a stage IIIb creatinine 2.6; elevated BNP at 170 (patient obese), normal magnesium.  Negative Covid test.  Chest x-ray demonstrating vascular congestion.  Patient received IV Lasix.  3 L nasal cannula supplementation in place.  TR[facility] has been contacted to place patient in the hospital for further evaluation and management of acute on chronic heart failure.  Assessment & Plan: 1-acute on chronic diastolic heart failure -Preserved ejection fraction appreciating most recent echo -Continue IV diuresis -Daily weights and strict intake and output -Continue low-sodium diet -Continue metolazone x1 more dose -Follow clinical response. -Still complaining of short winded sensation, orthopnea and is requiring 3 L nasal cannula supplementation.  2-chronic kidney disease a stage IIIb -Continue close monitoring of renal function while actively diuresing  patient -Creatinine at baseline.  3-type 2 diabetes with nephropathy -Continue sliding scale insulin and Levemir -Follow CBGs and adjust regimen as required.  4-history of non-Hodgkin lymphoma -Continue patient follow-up with oncology service.  5-history of atrial fibrillation -Rate controlled -Continue beta-blocker -Continue telemetry monitoring. -Continue Eliquis for secondary prevention.  6-essential hypertension -Appears to be stable -Continue to follow vital signs and adjust antihypertensive regimen as required.  7-history of coronary artery disease -No chest pain -EKG without acute ischemic changes -Will continue treatment with aspirin, statins, beta-blocker and Imdur  8-hypothyroidism -Continue Synthroid.  9-hyperlipidemia -Continue statins  10-history of COPD -No wheezing appreciated on exam -Continue home rescue/maintenance bronchodilator management.  11-morbid obesity -Low calorie diet, portion control and increase physical activity discussed with patient. -Body mass index is 40.32 kg/m.   12-history of thrombocytopenia -Appears to be stable and at baseline -No signs of overt bleeding -Continue to follow platelets trend.   DVT prophylaxis: Chronically on anticoagulation (Eliquis). Code Status: Full code Family Communication: No family at bedside. Disposition:   Status is: Inpatient  Dispo: The patient is from: Home              Anticipated d/c is to: Home              Patient currently is not medically stable for discharge.  Continue IV diuresis; close monitoring of renal function and electrolytes.  Daily weights and strict I's and O's.  Wean down oxygen supplementation towards baseline as tolerated.   Difficult to place patient no    Consultants:   None   Procedures:  See below for x-ray reports.  Antimicrobials:  None  Subjective: Afebrile, no chest pain, no nausea, no vomiting.  Still complaining of orthopnea and short winded  sensation with  activity.  Signs of fluid overload appreciated on exam (positive crackles and lower extremity swelling).  Requiring 3 L nasal cannula supplementation to maintain O2 sat above 90% (chronically on 2 L only).  Objective: Vitals:   10/07/20 0200 10/07/20 0550 10/07/20 1246 10/07/20 1330  BP: 139/81 138/80 (!) 115/59 111/67  Pulse: 64 61 (!) 39 60  Resp: 18 18 18    Temp: 98.2 F (36.8 C) 98.6 F (37 C) 98.1 F (36.7 C)   TempSrc: Oral  Oral   SpO2: 94% 96% 95%   Weight:  (!) 138.6 kg    Height:        Intake/Output Summary (Last 24 hours) at 10/07/2020 1421 Last data filed at 10/07/2020 1300 Gross per 24 hour  Intake 2166 ml  Output 4550 ml  Net -2384 ml   Filed Weights   10/06/20 1127 10/06/20 1642 10/07/20 0550  Weight: (!) 139.7 kg (!) 138.8 kg (!) 138.6 kg    Examination:  General exam: afebrile, no CP, no nausea, no vomiting. Still complaining of orthopnea and is using 3L [place] supplementation (normally on 2L at home.  Respiratory system: fine crackles on his bases bilaterally, no using accessory muscles. Cardiovascular system: rate controlled, no rubs, no gallops. Unable to assess JVD with body habitus. Gastrointestinal system: Abdomen is nondistended, soft and nontender. No organomegaly or masses felt. Normal bowel sounds heard. Central nervous system: Alert and oriented. No focal neurological deficits. Extremities: 1-2+ edema bilaterally. Skin: No petechiae. Psychiatry: Judgement and insight appear normal. Mood & affect appropriate.     Data Reviewed: I have personally reviewed following labs and imaging studies  CBC: Recent Labs  Lab 10/06/20 1201  WBC 6.0  HGB 15.7  HCT 49.9  MCV 91.9  PLT 125*    Basic Metabolic Panel: Recent Labs  Lab 10/06/20 1201 10/07/20 0539  NA 141 141  K 3.9 3.5  CL 104 104  CO2 25 27  GLUCOSE 91 102*  BUN 30* 37*  CREATININE 2.66* 2.95*  CALCIUM 9.1 8.8*  MG 2.3  --     GFR: Estimated Creatinine Clearance:  32.6 mL/min (A) (by C-G formula based on SCr of 2.95 mg/dL (H)).  Liver Function Tests: Recent Labs  Lab 10/06/20 1201  AST 10*  ALT 10  ALKPHOS 73  BILITOT 1.4*  PROT 7.6  ALBUMIN 4.4    CBG: Recent Labs  Lab 10/06/20 1654 10/06/20 1840 10/06/20 2117 10/07/20 0717 10/07/20 1106  GLUCAP 83 145* 129* 98 119*     Recent Results (from the past 240 hour(s))  Resp Panel by RT-PCR (Flu A&B, Covid) Nasopharyngeal Swab     Status: None   Collection Time: 10/06/20 12:05 PM   Specimen: Nasopharyngeal Swab; Nasopharyngeal(NP) swabs in vial transport medium  Result Value Ref Range Status   SARS Coronavirus 2 by RT PCR NEGATIVE NEGATIVE Final    Comment: (NOTE) SARS-CoV-2 target nucleic acids are NOT DETECTED.  The SARS-CoV-2 RNA is generally detectable in upper respiratory specimens during the acute phase of infection. The lowest concentration of SARS-CoV-2 viral copies this assay can detect is 138 copies/mL. A negative result does not preclude SARS-Cov-2 infection and should not be used as the sole basis for treatment or other patient management decisions. A negative result may occur with  improper specimen collection/handling, submission of specimen other than nasopharyngeal swab, presence of viral mutation(s) within the areas targeted by this assay, and inadequate number of viral copies(<138 copies/mL). A negative result must be combined with clinical  observations, patient history, and epidemiological information. The expected result is Negative.  Fact Sheet for Patients:  EntrepreneurPulse.com.au  Fact Sheet for Healthcare Providers:  IncredibleEmployment.be  This test is no t yet approved or cleared by the [place] FDA and  has been authorized for detection and/or diagnosis of SARS-CoV-2 by FDA under an Emergency Use Authorization (EUA). This EUA will remain  in effect (meaning this test can be used) for the duration of  the COVID-19 declaration under Section 564(b)(1) of the Act, 21 U.S.C.section 360bbb-3(b)(1), unless the authorization is terminated  or revoked sooner.       Influenza A by PCR NEGATIVE NEGATIVE Final   Influenza B by PCR NEGATIVE NEGATIVE Final    Comment: (NOTE) The Xpert Xpress SARS-CoV-2/FLU/RSV plus assay is intended as an aid in the diagnosis of influenza from Nasopharyngeal swab specimens and should not be used as a sole basis for treatment. Nasal washings and aspirates are unacceptable for Xpert Xpress SARS-CoV-2/FLU/RSV testing.  Fact Sheet for Patients: EntrepreneurPulse.com.au  Fact Sheet for Healthcare Providers: IncredibleEmployment.be  This test is not yet approved or cleared by the [place] FDA and has been authorized for detection and/or diagnosis of SARS-CoV-2 by FDA under an Emergency Use Authorization (EUA). This EUA will remain in effect (meaning this test can be used) for the duration of the COVID-19 declaration under Section 564(b)(1) of the Act, 21 U.S.C. section 360bbb-3(b)(1), unless the authorization is terminated or revoked.  Performed at [facility], [address] [place], [place] [address]      Radiology Studies: [facility] Chest [facility] 1 View  Result Date: 10/06/2020 CLINICAL DATA:  Shortness of breath.  Swelling in both legs. EXAM: PORTABLE CHEST 1 VIEW COMPARISON:  August 04, 2020 FINDINGS: No pneumothorax. Stable cardiomegaly. The hila and mediastinum are unremarkable. Increased interstitial opacities in the lungs, right greater than left. No other acute abnormalities. IMPRESSION: Findings are most consistent with cardiomegaly and mildly asymmetric pulmonary edema. Electronically Signed   By: Dorise Bullion III M.D   On: 10/06/2020 12:53   Scheduled Meds: . apixaban  2.5 mg Oral BID  . aspirin EC  81 mg Oral Daily  . bisoprolol  2.5 mg Oral Daily  . fluticasone furoate-vilanterol  1 puff Inhalation Daily  .  furosemide  60 mg Intravenous Q12H  . insulin aspart  0-5 Units Subcutaneous QHS  . insulin aspart  0-9 Units Subcutaneous TID WC  . insulin detemir  7 Units Subcutaneous QHS  . isosorbide mononitrate  30 mg Oral Daily  . levothyroxine  300 mcg Oral QAC breakfast  . loratadine  10 mg Oral Daily  . simvastatin  5 mg Oral q1800  . sodium chloride flush  3 mL Intravenous Q12H   Continuous Infusions: . sodium chloride       LOS: 1 day    Time spent: 35 minutes   Barton Dubois, MD [facility]   To contact the attending provider between 7A-7P or the covering provider during after hours 7P-7A, please log into the web site [email or web address] and access using universal [place] password for that web site. If you do not have the password, please call the hospital operator.  10/07/2020, 2:21 PM

## 2020-10-07 NOTE — Progress Notes (Signed)
Breo Placed in room

## 2020-10-07 NOTE — Progress Notes (Signed)
Tele called to report that patient had brady down to 38. Went to assess patient he reports feeling fine, patient was asleep upon entering the room.

## 2020-10-07 NOTE — TOC Initial Note (Addendum)
Transition of Care [facility]) - Initial/Assessment Note    Patient Details  Name: [name] [name] MRN: [id number] Date of Birth: 05/22/1946  Transition [facility] Care [facility]) CM/SW Contact:    Boneta Lucks, RN Phone Number: 10/07/2020, 1:18 PM  Clinical Narrative:    Patient admitted with chronic CHF. TOC consulted. TOC spoke with patient, he lives at home with his daughter. He drives and is independent at baseline. His cardiologist is  Dr Domenic Polite. Patient does not do daily weights, does not follow heart healthy diet and has not been given any fluid restrictions.  TOC explained to patient all these things plays a role in fluid overload in the body. Patient encourage to ask for more education at his next cardiology and PCP appointments. Patient is active with [facility] for RN/PT, TOC will get new orders at discharge. No other needs identified.            Expected Discharge Plan: Home/Self Care Barriers to Discharge: Continued Medical Work up   Patient Goals and CMS Choice Patient states their goals for this hospitalization and ongoing recovery are:: to go home. CMS Medicare.gov Compare Post Acute Care list provided to:: Patient Choice offered to / list presented to : Patient  Expected Discharge Plan and Services Expected Discharge Plan: Home/Self Care    Living arrangements for the past 2 months: Single Family Home    Prior Living Arrangements/Services Living arrangements for the past 2 months: Single Family Home Lives with:: Adult Children Patient language and need for interpreter reviewed:: Yes        Need for Family Participation in Patient Care: Yes (Comment) Care giver support system in place?: Yes (comment)   Criminal Activity/Legal Involvement Pertinent to Current Situation/Hospitalization: No - Comment as needed  Activities of Daily Living Home Assistive Devices/Equipment: Cane (specify quad or straight),Walker (specify type),Shower chair with back,Raised toilet seat with  rails,Oxygen ADL Screening (condition at time of admission) Patient's cognitive ability adequate to safely complete daily activities?: Yes Is the patient deaf or have difficulty hearing?: No Does the patient have difficulty seeing, even when wearing glasses/contacts?: No Does the patient have difficulty concentrating, remembering, or making decisions?: No Patient able to express need for assistance with ADLs?: Yes Does the patient have difficulty dressing or bathing?: No Independently performs ADLs?: Yes (appropriate for developmental age) Does the patient have difficulty walking or climbing stairs?: Yes Weakness of Legs: Both Weakness of Arms/Hands: None  Permission Sought/Granted     Emotional Assessment     Affect (typically observed): Accepting Orientation: : Oriented to Self,Oriented to Place,Oriented to  Time,Oriented to Situation Alcohol / Substance Use: Not Applicable Psych Involvement: No (comment)  Admission diagnosis:  SOB (shortness of breath) [R06.02] Acute on chronic diastolic HF (heart failure) (HCC) [I50.33] Acute on chronic congestive heart failure, unspecified heart failure type [facility]) [I50.9] Patient Active Problem List   Diagnosis Date Noted  . Acute on chronic diastolic HF (heart failure) ([place]) 10/06/2020  . Chronic respiratory failure with hypoxia and hypercapnia ([place]) 09/10/2020  . Former smoker 08/10/2020  . COPD (chronic obstructive pulmonary disease) ([place]) 08/10/2020  . Acute on chronic diastolic CHF (congestive heart failure) ([place]) 08/05/2020  . Acute respiratory failure with hypoxia ([place]) 08/04/2020  . Acute exacerbation of CHF (congestive heart failure) ([place]) 07/19/2020  . AF (paroxysmal atrial fibrillation) ([place]) 07/19/2020  . COVID-19   . Acute on chronic respiratory failure with hypoxia ([place]) 06/25/2020  . Aspiration pneumonia ([place]) 06/24/2020  . Pneumonia due to COVID-19 virus 06/24/2020  .  Confusion   . Small bowel obstruction ([place])  01/29/2020  . SBO (small bowel obstruction) ([place]) 01/28/2020  . Primary osteoarthritis of left knee 05/24/2018  . Primary localized osteoarthritis of left knee 05/19/2018  . Acute on chronic systolic and diastolic heart failure, NYHA class 1 ([place]) 04/01/2017  . CAD (coronary artery disease) 04/01/2017  . DOE (dyspnea on exertion) 03/21/2017  . HTN (hypertension) 03/21/2017  . NSTEMI (non-ST elevated myocardial infarction) ([place]) 01/19/2017  . NSTEMI, initial episode of care ([place]) 01/19/2017  . Sepsis ([place]) 07/24/2016  . Elevated troponin I level 07/24/2016  . Fever 07/24/2016  . Lactic acidosis 07/24/2016  . Adjustment insomnia 05/18/2016  . Erectile dysfunction 12/19/2015  . Sleep apnea 09/30/2015  . Osteoarthritis 09/30/2015  . Hypothyroidism 09/30/2015  . Hypogonadism in male 09/30/2015  . Diabetic neuropathy ([place]) 09/30/2015  . Chronic pain of left knee 09/30/2015  . Benign essential hypertension 09/30/2015  . Acute on chronic kidney failure ([place]) 05/14/2015  . Diarrhea 05/14/2015  . Dehydration 05/14/2015  . Hypokalemia 05/14/2015  . 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 Pelvic mass in male   . Varices, esophageal ([place])   . Colonic mass   . Abnormal CT scan, pelvis   . Bladder mass   . Elevated liver enzymes   . Elevated LFTs   . Abdominal pain 07/23/2014  . Chronic kidney disease Stage IV 07/23/2014  . Morbid obesity ([place]) 07/23/2014  . Type 2 diabetes mellitus with stage 4 chronic kidney disease ([place]) 07/23/2014  . Hypertension 07/23/2014  . S/P total knee replacement, 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   PCP:  [name], DO Pharmacy:   [facility] [place], [place] [address] Phone: [phone] Fax: [phone]   Readmission Risk Interventions Readmission Risk Prevention Plan 10/07/2020 08/05/2020 06/26/2020  Transportation Screening Complete Complete Complete  Medication Review (RN Care Manager) Complete Complete  Complete  PCP or Specialist appointment within 3-5 days of discharge - - Complete  HRI or Home Care Consult Complete Complete Complete  SW Recovery Care/Counseling Consult Complete Complete Complete  Palliative Care Screening Not Applicable Not Applicable Not Applicable  Skilled Nursing Facility Not Applicable Not Applicable Complete  Some recent data might be hidden

## 2020-10-08 ENCOUNTER — Ambulatory Visit: Payer: Medicare HMO | Admitting: Cardiology

## 2020-10-08 DIAGNOSIS — E1121 Type 2 diabetes mellitus with diabetic nephropathy: Secondary | ICD-10-CM | POA: Diagnosis not present

## 2020-10-08 DIAGNOSIS — R0602 Shortness of breath: Secondary | ICD-10-CM | POA: Diagnosis not present

## 2020-10-08 DIAGNOSIS — I5033 Acute on chronic diastolic (congestive) heart failure: Secondary | ICD-10-CM | POA: Diagnosis not present

## 2020-10-08 LAB — GLUCOSE, CAPILLARY
Glucose-Capillary: 146 mg/dL — ABNORMAL HIGH (ref 70–99)
Glucose-Capillary: 135 mg/dL — ABNORMAL HIGH (ref 70–99)
Glucose-Capillary: 133 mg/dL — ABNORMAL HIGH (ref 70–99)

## 2020-10-08 LAB — BASIC METABOLIC PANEL
Anion gap: 13 (ref 5–15)
BUN: 50 mg/dL — ABNORMAL HIGH (ref 8–23)
CO2: 27 mmol/L (ref 22–32)
Calcium: 9 mg/dL (ref 8.9–10.3)
Chloride: 100 mmol/L (ref 98–111)
Creatinine, Ser: 3.08 mg/dL — ABNORMAL HIGH (ref 0.61–1.24)
GFR, Estimated: 21 mL/min — ABNORMAL LOW (ref >60)
Glucose, Bld: 104 mg/dL — ABNORMAL HIGH (ref 70–99)
Potassium: 3 mmol/L — ABNORMAL LOW (ref 3.5–5.1)
Sodium: 140 mmol/L (ref 135–145)

## 2020-10-08 MED ORDER — FUROSEMIDE 10 MG/ML IJ SOLN
40.0000 mg | Freq: Two times a day (BID) | INTRAMUSCULAR | Status: DC
  Administered 2020-10-08 – 2020-10-09 (×2): 40 mg via INTRAVENOUS
  Filled 2020-10-08 (×2): qty 4

## 2020-10-08 MED ORDER — NYSTATIN 100000 UNIT/GM EX POWD
Freq: Two times a day (BID) | CUTANEOUS | Status: DC
  Filled 2020-10-08: qty 15

## 2020-10-08 MED ORDER — POTASSIUM CHLORIDE CRYS ER 20 MEQ PO TBCR
40.0000 meq | EXTENDED_RELEASE_TABLET | ORAL | Status: AC
  Administered 2020-10-08 (×2): 40 meq via ORAL
  Filled 2020-10-08: qty 2
  Filled 2020-10-08: qty 4

## 2020-10-08 NOTE — Progress Notes (Signed)
PROGRESS NOTE    [name] [name]  BSW:[id number] DOB: [birth year] DOA: 10/06/2020 PCP: [name], DO   Chief Complaint  Patient presents with  . Shortness of Breath    Brief admission narrative: [name] [name] is a 75 y.o. male with medical history significant of coronary artery disease, atrial fibrillation (chronically on Eliquis), chronic kidney disease a stage IIIb, hypertension, hyperlipidemia, hypothyroidism, morbid obesity, diastolic heart failure, type 2 diabetes mellitus with nephropathy and history of non-Hodgkin lymphoma; who presented to the hospital secondary to increased shortness of breath, orthopnea and lower extremity swelling.  Patient reports symptom has been ongoing for the last 4 to 5 days and worse in the last 2440 hrs. prior to admission.  He reports medication compliance.  No fever, no chills, no chest pain, no nausea, no vomiting, no hematuria, no melena, no hematochezia, no focal weaknesses.  ED Course: No elevation of WBCs appreciated on exam; stable chronic thrombocytopenia.Stable and at baseline chronic kidney disease a stage IIIb creatinine 2.6; elevated BNP at 170 (patient obese), normal magnesium.  Negative Covid test.  Chest x-ray demonstrating vascular congestion.  Patient received IV Lasix.  3 L nasal cannula supplementation in place.  TRH has been contacted to place patient in the hospital for further evaluation and management of acute on chronic heart failure.  Assessment & Plan: 1-acute on chronic diastolic heart failure -Preserved ejection fraction appreciating most recent echo -Continue IV diuresis, Lasix dose has been adjusted in the setting of acute kidney injury on chronic renal failure. -Continue to follow daily weights and strict intake and output -Continue low-sodium diet. -Follow clinical response. -Still complaining of short winded sensation, orthopnea and is requiring 3 L nasal cannula supplementation.  2-acute kidney injury: Chronic kidney  disease a stage IIIb -In the setting of diuresis -Will back off on the use of metolazone and reduce Lasix dosages -Close monitoring of renal function trend.  3-type 2 diabetes with nephropathy -Continue sliding scale insulin and Levemir -Follow CBGs and adjust regimen as required.  4-history of non-Hodgkin lymphoma -Continue patient follow-up with oncology service.  5-history of atrial fibrillation -Rate controlled -Continue beta-blocker -Continue telemetry monitoring. -Continue Eliquis for secondary prevention.  6-essential hypertension -Appears to be stable -Continue to follow vital signs and adjust antihypertensive regimen as required.  7-history of coronary artery disease -No chest pain -EKG without acute ischemic changes -Will continue treatment with aspirin, statins, beta-blocker and Imdur  8-hypothyroidism -Continue Synthroid.  9-hyperlipidemia -Continue statins  10-history of COPD -No wheezing appreciated on exam -Continue home rescue/maintenance bronchodilator management.  11-morbid obesity -Low calorie diet, portion control and increase physical activity discussed with patient. -Body mass index is 39.47 kg/m.   12-history of thrombocytopenia -Appears to be stable and at baseline -No signs of overt bleeding -Continue to follow platelets trend.     DVT prophylaxis: Chronically on anticoagulation (Eliquis). Code Status: Full code Family Communication: No family at bedside. Disposition:   Status is: Inpatient  Dispo: The patient is from: Home              Anticipated d/c is to: Home              Patient currently is not medically stable for discharge.  Continue IV diuresis; close monitoring of renal function and electrolytes.  Daily weights and strict I's and O's.  Wean down oxygen supplementation towards baseline as tolerated.   Difficult to place patient no    Consultants:   None   Procedures:  See  below for x-ray  reports.  Antimicrobials:  None  Subjective: Good oxygen saturation on 3 L nasal cannula supplementation; no chest pain, no palpitations, no nausea, no vomiting.  Patient reports improvement in his breathing, even is still having mild orthopnea.  Still with signs of fluid overload on examination.  Objective: Vitals:   10/08/20 0500 10/08/20 0508 10/08/20 0841 10/08/20 1254  BP:  130/61  107/61  Pulse:  (!) 56  (!) 49  Resp:  18  16  Temp:  98.9 F (37.2 C)  (!) 97.4 F (36.3 C)  TempSrc:    Oral  SpO2:  94% 97% 95%  Weight: 135.7 kg     Height:        Intake/Output Summary (Last 24 hours) at 10/08/2020 1726 Last data filed at 10/08/2020 1700 Gross per 24 hour  Intake 1680 ml  Output 3100 ml  Net -1420 ml   Filed Weights   10/06/20 1642 10/07/20 0550 10/08/20 0500  Weight: (!) 138.8 kg (!) 138.6 kg 135.7 kg    Examination: General exam: Alert, awake, oriented x 3; no fever, no chest pain, no palpitations.  Patient reports breathing is improving.  No nausea or vomiting.  Still experiencing orthopnea, but much better. Respiratory system: Mild fine crackles at the bases; no wheezing, no using accessory muscles, no labored breathing. Cardiovascular system: Rate controlled, no rubs, no gallops, unable to properly assess JVD with body habitus.   Gastrointestinal system: Abdomen is obese, nondistended, soft and nontender. No organomegaly or masses felt. Normal bowel sounds heard. Central nervous system: Alert and oriented. No focal neurological deficits. Extremities: No cyanosis or clubbing; 1+ edema appreciated bilaterally. Skin: No petechiae. Psychiatry: Judgement and insight appear normal. Mood & affect appropriate.    Data Reviewed: I have personally reviewed following labs and imaging studies  CBC: Recent Labs  Lab 10/06/20 1201  WBC 6.0  HGB 15.7  HCT 49.9  MCV 91.9  PLT 125*    Basic Metabolic Panel: Recent Labs  Lab 10/06/20 1201 10/07/20 0539 10/08/20 0533   NA 141 141 140  K 3.9 3.5 3.0*  CL 104 104 100  CO2 25 27 27   GLUCOSE 91 102* 104*  BUN 30* 37* 50*  CREATININE 2.66* 2.95* 3.08*  CALCIUM 9.1 8.8* 9.0  MG 2.3  --   --     GFR: Estimated Creatinine Clearance: 30.9 mL/min (A) (by C-G formula based on SCr of 3.08 mg/dL (H)).  Liver Function Tests: Recent Labs  Lab 10/06/20 1201  AST 10*  ALT 10  ALKPHOS 73  BILITOT 1.4*  PROT 7.6  ALBUMIN 4.4    CBG: Recent Labs  Lab 10/07/20 1106 10/07/20 1609 10/07/20 2003 10/08/20 0720 10/08/20 1107  GLUCAP 119* 104* 146* 135* 133*     Recent Results (from the past 240 hour(s))  Resp Panel by RT-PCR (Flu A&B, Covid) Nasopharyngeal Swab     Status: None   Collection Time: 10/06/20 12:05 PM   Specimen: Nasopharyngeal Swab; Nasopharyngeal(NP) swabs in vial transport medium  Result Value Ref Range Status   SARS Coronavirus 2 by RT PCR NEGATIVE NEGATIVE Final    Comment: (NOTE) SARS-CoV-2 target nucleic acids are NOT DETECTED.  The SARS-CoV-2 RNA is generally detectable in upper respiratory specimens during the acute phase of infection. The lowest concentration of SARS-CoV-2 viral copies this assay can detect is 138 copies/mL. A negative result does not preclude SARS-Cov-2 infection and should not be used as the sole basis for treatment or other  patient management decisions. A negative result may occur with  improper specimen collection/handling, submission of specimen other than nasopharyngeal swab, presence of viral mutation(s) within the areas targeted by this assay, and inadequate number of viral copies(<138 copies/mL). A negative result must be combined with clinical observations, patient history, and epidemiological information. The expected result is Negative.  Fact Sheet for Patients:  EntrepreneurPulse.com.au  Fact Sheet for Healthcare Providers:  IncredibleEmployment.be  This test is no t yet approved or cleared by the  [place] FDA and  has been authorized for detection and/or diagnosis of SARS-CoV-2 by FDA under an Emergency Use Authorization (EUA). This EUA will remain  in effect (meaning this test can be used) for the duration of the COVID-19 declaration under Section 564(b)(1) of the Act, 21 U.S.C.section 360bbb-3(b)(1), unless the authorization is terminated  or revoked sooner.       Influenza A by PCR NEGATIVE NEGATIVE Final   Influenza B by PCR NEGATIVE NEGATIVE Final    Comment: (NOTE) The Xpert Xpress SARS-CoV-2/FLU/RSV plus assay is intended as an aid in the diagnosis of influenza from Nasopharyngeal swab specimens and should not be used as a sole basis for treatment. Nasal washings and aspirates are unacceptable for Xpert Xpress SARS-CoV-2/FLU/RSV testing.  Fact Sheet for Patients: EntrepreneurPulse.com.au  Fact Sheet for Healthcare Providers: IncredibleEmployment.be  This test is not yet approved or cleared by the [place] FDA and has been authorized for detection and/or diagnosis of SARS-CoV-2 by FDA under an Emergency Use Authorization (EUA). This EUA will remain in effect (meaning this test can be used) for the duration of the COVID-19 declaration under Section 564(b)(1) of the Act, 21 U.S.C. section 360bbb-3(b)(1), unless the authorization is terminated or revoked.  Performed at [facility], [address] [place], [place] [address]      Radiology Studies: No results found. Scheduled Meds: . apixaban  2.5 mg Oral BID  . aspirin EC  81 mg Oral Daily  . bisoprolol  2.5 mg Oral Daily  . fluticasone furoate-vilanterol  1 puff Inhalation Daily  . furosemide  40 mg Intravenous Q12H  . insulin aspart  0-5 Units Subcutaneous QHS  . insulin aspart  0-9 Units Subcutaneous TID WC  . insulin detemir  7 Units Subcutaneous QHS  . isosorbide mononitrate  30 mg Oral Daily  . levothyroxine  300 mcg Oral QAC breakfast  . loratadine  10 mg  Oral Daily  . simvastatin  5 mg Oral q1800  . sodium chloride flush  3 mL Intravenous Q12H   Continuous Infusions: . sodium chloride       LOS: 2 days    Time spent: 35 minutes   Barton Dubois, MD [facility]   To contact the attending provider between 7A-7P or the covering provider during after hours 7P-7A, please log into the web site [email or web address] and access using universal [place] password for that web site. If you do not have the password, please call the hospital operator.  10/08/2020, 5:26 PM

## 2020-10-09 DIAGNOSIS — I5033 Acute on chronic diastolic (congestive) heart failure: Secondary | ICD-10-CM | POA: Diagnosis not present

## 2020-10-09 LAB — GLUCOSE, CAPILLARY
Glucose-Capillary: 175 mg/dL — ABNORMAL HIGH (ref 70–99)
Glucose-Capillary: 116 mg/dL — ABNORMAL HIGH (ref 70–99)
Glucose-Capillary: 119 mg/dL — ABNORMAL HIGH (ref 70–99)
Glucose-Capillary: 139 mg/dL — ABNORMAL HIGH (ref 70–99)
Glucose-Capillary: 144 mg/dL — ABNORMAL HIGH (ref 70–99)
Glucose-Capillary: 123 mg/dL — ABNORMAL HIGH (ref 70–99)
Glucose-Capillary: 115 mg/dL — ABNORMAL HIGH (ref 70–99)

## 2020-10-09 LAB — BASIC METABOLIC PANEL
Anion gap: 14 (ref 5–15)
BUN: 65 mg/dL — ABNORMAL HIGH (ref 8–23)
CO2: 24 mmol/L (ref 22–32)
Calcium: 8.7 mg/dL — ABNORMAL LOW (ref 8.9–10.3)
Chloride: 100 mmol/L (ref 98–111)
Creatinine, Ser: 3.15 mg/dL — ABNORMAL HIGH (ref 0.61–1.24)
GFR, Estimated: 20 mL/min — ABNORMAL LOW (ref >60)
Glucose, Bld: 136 mg/dL — ABNORMAL HIGH (ref 70–99)
Potassium: 3.5 mmol/L (ref 3.5–5.1)
Sodium: 138 mmol/L (ref 135–145)

## 2020-10-09 MED ORDER — ONDANSETRON HCL 4 MG/2ML IJ SOLN: 4.0000 mg | Freq: Four times a day (QID) | INTRAMUSCULAR | Status: DC | PRN

## 2020-10-09 MED ORDER — PROMETHAZINE HCL 25 MG/ML IJ SOLN: 12.5000 mg | Freq: Once | INTRAMUSCULAR | Status: DC

## 2020-10-09 NOTE — Progress Notes (Signed)
PROGRESS NOTE    [name] [name]  ENI:[id number] DOB: 03/16/1946 DOA: 10/06/2020 PCP: [name], DO   Brief Narrative:  [name] Gimenez Joyceis a 75 y.o.malewith medical history significant ofcoronary artery disease, atrial fibrillation (chronically on Eliquis), chronic kidney disease a stage IIIb, hypertension, hyperlipidemia, hypothyroidism, morbid obesity, diastolic heart failure, type 2 diabetes mellitus with nephropathy and history of non-Hodgkin lymphoma;who presented to the hospital secondary to increased shortness of breath, orthopnea and lower extremity swelling.  Patient was admitted with acute on chronic diastolic heart failure exacerbation and has been receiving IV Lasix and initially some metolazone for diuresis.  He is noted to have some AKI on CKD stage IIIb with uptrend in creatinine noted for which Lasix has been held today.  Plan to wean oxygen further today to baseline of 2 L and anticipate discharge by a.m. if stable.  Assessment & Plan:   Active Problems:   Acute on chronic diastolic HF (heart failure) (HCC)   1-acute on chronic diastolic heart failure-improved -Baseline weight near 300 pounds and patient currently at 295, no further diuresis at this time -Preserved ejection fraction appreciating most recent echo -Hold further diuresis given creatinine up trended -Continue to follow daily weights and strict intake and output -Continue low-sodium diet. -Follow clinical response. -Still complaining of short winded sensation, orthopnea and is requiring 4 L nasal cannula supplementation.  2-acute kidney injury: Chronic kidney disease a stage IIIb -Baseline creatinine 2.3-2.4, currently 3.15 -In the setting of diuresis -Hold further diuretics on 3/9 -Recheck labs in a.m. to ensure stability prior to discharge -If creatinine continues to trend upwards, may consider nephrology evaluation or further monitoring  3-type 2 diabetes with nephropathy -Continue sliding scale  insulin and Levemir -Follow CBGs and adjust regimen as required.  4-history of non-Hodgkin lymphoma -Continue patient follow-up with oncology service.  5-history of atrial fibrillation -Rate controlled -Continue beta-blocker -Continue telemetry monitoring. -Continue Eliquis for secondary prevention.  6-essential hypertension -Appears to be stable -Continue to follow vital signs and adjust antihypertensive regimen as required.  7-history of coronary artery disease -No chest pain -EKG without acute ischemic changes -Will continue treatment with aspirin, statins, beta-blocker and Imdur  8-hypothyroidism -Continue Synthroid.  9-hyperlipidemia -Continue statins  10-history of COPD -No wheezing appreciated on exam -Continue home rescue/maintenance bronchodilator management.  11-morbid obesity -Low calorie diet, portion control and increase physical activity discussed with patient. -Body mass index is 39.47 kg/m.   12-history of thrombocytopenia -Appears to be stable and at baseline -No signs of overt bleeding -Continue to follow platelets trend, plan to recheck CBC in a.m.   DVT prophylaxis:Eliquis Code Status: Full Family Communication: Tried calling daughter with no response 3/9 Disposition Plan:  Status is: Inpatient  Remains inpatient appropriate because:IV treatments appropriate due to intensity of illness or inability to take PO and Inpatient level of care appropriate due to severity of illness   Dispo: The patient is from: Home              Anticipated d/c is to: Home              Patient currently is not medically stable to d/c.   Difficult to place patient No  Consultants:   None  Procedures:   See below  Antimicrobials:   None   Subjective: Patient seen and evaluated today with no new acute complaints or concerns. No acute concerns or events noted overnight.  Objective: Vitals:   10/08/20 2125 10/09/20 6144 10/09/20 3154 10/09/20  0086  BP: 116/73 132/80  102/65  Pulse: 63 98  78  Resp: 20 20    Temp: 98.1 F (36.7 C) 100 F (37.8 C)    TempSrc: Oral Oral    SpO2: 95% 92% 92% 95%  Weight:  134.2 kg    Height:        Intake/Output Summary (Last 24 hours) at 10/09/2020 1330 Last data filed at 10/09/2020 1053 Gross per 24 hour  Intake 840 ml  Output 1350 ml  Net -510 ml   Filed Weights   10/07/20 0550 10/08/20 0500 10/09/20 0536  Weight: (!) 138.6 kg 135.7 kg 134.2 kg    Examination:  General exam: Appears calm and comfortable, obese Respiratory system: Clear to auscultation. Respiratory effort normal.  On 4 L nasal cannula Cardiovascular system: S1 & S2 heard, RRR.  Gastrointestinal system: Abdomen is soft Central nervous system: Alert and awake Extremities: No edema Skin: No significant lesions noted Psychiatry: Flat affect.    Data Reviewed: I have personally reviewed following labs and imaging studies  CBC: Recent Labs  Lab 10/06/20 1201  WBC 6.0  HGB 15.7  HCT 49.9  MCV 91.9  PLT 213*   Basic Metabolic Panel: Recent Labs  Lab 10/06/20 1201 10/07/20 0539 10/08/20 0533 10/09/20 0557  NA 141 141 140 138  K 3.9 3.5 3.0* 3.5  CL 104 104 100 100  CO2 25 27 27 24   GLUCOSE 91 102* 104* 136*  BUN 30* 37* 50* 65*  CREATININE 2.66* 2.95* 3.08* 3.15*  CALCIUM 9.1 8.8* 9.0 8.7*  MG 2.3  --   --   --    GFR: Estimated Creatinine Clearance: 30 mL/min (A) (by C-G formula based on SCr of 3.15 mg/dL (H)). Liver Function Tests: Recent Labs  Lab 10/06/20 1201  AST 10*  ALT 10  ALKPHOS 73  BILITOT 1.4*  PROT 7.6  ALBUMIN 4.4   No results for input(s): LIPASE, AMYLASE in the last 168 hours. No results for input(s): AMMONIA in the last 168 hours. Coagulation Profile: Recent Labs  Lab 10/06/20 1201  INR 1.2   Cardiac Enzymes: No results for input(s): CKTOTAL, CKMB, CKMBINDEX, TROPONINI in the last 168 hours. BNP (last 3 results) No results for input(s): PROBNP in the last 8760  hours. HbA1C: No results for input(s): HGBA1C in the last 72 hours. CBG: Recent Labs  Lab 10/08/20 1559 10/08/20 1655 10/08/20 2126 10/09/20 0730 10/09/20 1108  GLUCAP 175* 116* 119* 139* 144*   Lipid Profile: No results for input(s): CHOL, HDL, LDLCALC, TRIG, CHOLHDL, LDLDIRECT in the last 72 hours. Thyroid Function Tests: No results for input(s): TSH, T4TOTAL, FREET4, T3FREE, THYROIDAB in the last 72 hours. Anemia Panel: No results for input(s): VITAMINB12, FOLATE, FERRITIN, TIBC, IRON, RETICCTPCT in the last 72 hours. Sepsis Labs: No results for input(s): PROCALCITON, LATICACIDVEN in the last 168 hours.  Recent Results (from the past 240 hour(s))  Resp Panel by RT-PCR (Flu A&B, Covid) Nasopharyngeal Swab     Status: None   Collection Time: 10/06/20 12:05 PM   Specimen: Nasopharyngeal Swab; Nasopharyngeal(NP) swabs in vial transport medium  Result Value Ref Range Status   SARS Coronavirus 2 by RT PCR NEGATIVE NEGATIVE Final    Comment: (NOTE) SARS-CoV-2 target nucleic acids are NOT DETECTED.  The SARS-CoV-2 RNA is generally detectable in upper respiratory specimens during the acute phase of infection. The lowest concentration of SARS-CoV-2 viral copies this assay can detect is 138 copies/mL. A negative result does not preclude SARS-Cov-2 infection and  should not be used as the sole basis for treatment or other patient management decisions. A negative result may occur with  improper specimen collection/handling, submission of specimen other than nasopharyngeal swab, presence of viral mutation(s) within the areas targeted by this assay, and inadequate number of viral copies(<138 copies/mL). A negative result must be combined with clinical observations, patient history, and epidemiological information. The expected result is Negative.  Fact Sheet for Patients:  EntrepreneurPulse.com.au  Fact Sheet for Healthcare Providers:   IncredibleEmployment.be  This test is no t yet approved or cleared by the [place] FDA and  has been authorized for detection and/or diagnosis of SARS-CoV-2 by FDA under an Emergency Use Authorization (EUA). This EUA will remain  in effect (meaning this test can be used) for the duration of the COVID-19 declaration under Section 564(b)(1) of the Act, 21 U.S.C.section 360bbb-3(b)(1), unless the authorization is terminated  or revoked sooner.       Influenza A by PCR NEGATIVE NEGATIVE Final   Influenza B by PCR NEGATIVE NEGATIVE Final    Comment: (NOTE) The Xpert Xpress SARS-CoV-2/FLU/RSV plus assay is intended as an aid in the diagnosis of influenza from Nasopharyngeal swab specimens and should not be used as a sole basis for treatment. Nasal washings and aspirates are unacceptable for Xpert Xpress SARS-CoV-2/FLU/RSV testing.  Fact Sheet for Patients: EntrepreneurPulse.com.au  Fact Sheet for Healthcare Providers: IncredibleEmployment.be  This test is not yet approved or cleared by the [place] FDA and has been authorized for detection and/or diagnosis of SARS-CoV-2 by FDA under an Emergency Use Authorization (EUA). This EUA will remain in effect (meaning this test can be used) for the duration of the COVID-19 declaration under Section 564(b)(1) of the Act, 21 U.S.C. section 360bbb-3(b)(1), unless the authorization is terminated or revoked.  Performed at [facility], [address], [place], [place] [address]      Radiology Studies: No results found.    Scheduled Meds: . apixaban  2.5 mg Oral BID  . aspirin EC  81 mg Oral Daily  . bisoprolol  2.5 mg Oral Daily  . fluticasone furoate-vilanterol  1 puff Inhalation Daily  . insulin aspart  0-5 Units Subcutaneous QHS  . insulin aspart  0-9 Units Subcutaneous TID WC  . insulin detemir  7 Units Subcutaneous QHS  . isosorbide mononitrate  30 mg Oral Daily  .  levothyroxine  300 mcg Oral QAC breakfast  . loratadine  10 mg Oral Daily  . nystatin   Topical BID  . promethazine  12.5 mg Intravenous Once  . simvastatin  5 mg Oral q1800  . sodium chloride flush  3 mL Intravenous Q12H   Continuous Infusions: . sodium chloride       LOS: 3 days    Time spent: 35 minutes    Deserai Cansler Darleen Crocker, DO [facility]  If 7PM-7AM, please contact night-coverage [email or web address] 10/09/2020, 1:30 PM

## 2020-10-09 NOTE — Progress Notes (Signed)
Patient has thrown up twice during shift. First time at 2340  a large amount of undigested lasagna. At 0245 patient had another small episode of nausea and vomitting. Patient refused nausea medication that was ordered at 0256. Patient is now resting well. Will continue to monitor as needed.

## 2020-10-10 ENCOUNTER — Inpatient Hospital Stay (HOSPITAL_COMMUNITY): Payer: Medicare HMO

## 2020-10-10 DIAGNOSIS — I5033 Acute on chronic diastolic (congestive) heart failure: Secondary | ICD-10-CM | POA: Diagnosis not present

## 2020-10-10 LAB — GLUCOSE, CAPILLARY
Glucose-Capillary: 128 mg/dL — ABNORMAL HIGH (ref 70–99)
Glucose-Capillary: 110 mg/dL — ABNORMAL HIGH (ref 70–99)
Glucose-Capillary: 128 mg/dL — ABNORMAL HIGH (ref 70–99)

## 2020-10-10 LAB — CBC
HCT: 46.4 % (ref 39.0–52.0)
Hemoglobin: 14.7 g/dL (ref 13.0–17.0)
MCH: 28.5 pg (ref 26.0–34.0)
MCHC: 31.7 g/dL (ref 30.0–36.0)
MCV: 90.1 fL (ref 80.0–100.0)
Platelets: 111 10*3/uL — ABNORMAL LOW (ref 150–400)
RBC: 5.15 MIL/uL (ref 4.22–5.81)
RDW: 14.7 % (ref 11.5–15.5)
WBC: 4.7 10*3/uL (ref 4.0–10.5)
nRBC: 0 % (ref 0.0–0.2)

## 2020-10-10 LAB — BASIC METABOLIC PANEL
Anion gap: 15 (ref 5–15)
BUN: 81 mg/dL — ABNORMAL HIGH (ref 8–23)
CO2: 24 mmol/L (ref 22–32)
Calcium: 8.3 mg/dL — ABNORMAL LOW (ref 8.9–10.3)
Chloride: 99 mmol/L (ref 98–111)
Creatinine, Ser: 3.62 mg/dL — ABNORMAL HIGH (ref 0.61–1.24)
GFR, Estimated: 17 mL/min — ABNORMAL LOW (ref >60)
Glucose, Bld: 107 mg/dL — ABNORMAL HIGH (ref 70–99)
Potassium: 3.1 mmol/L — ABNORMAL LOW (ref 3.5–5.1)
Sodium: 138 mmol/L (ref 135–145)

## 2020-10-10 LAB — MAGNESIUM: Magnesium: 2.2 mg/dL (ref 1.7–2.4)

## 2020-10-10 MED ORDER — LACTATED RINGERS IV SOLN: INTRAVENOUS | Status: AC

## 2020-10-10 MED ORDER — POTASSIUM CHLORIDE CRYS ER 20 MEQ PO TBCR
40.0000 meq | EXTENDED_RELEASE_TABLET | Freq: Two times a day (BID) | ORAL | Status: AC
  Administered 2020-10-10 (×2): 40 meq via ORAL
  Filled 2020-10-10 (×2): qty 2

## 2020-10-10 NOTE — Progress Notes (Signed)
PROGRESS NOTE    DHRUVAN GULLION  JEH:[id number] DOB: 1946-03-02 DOA: 10/06/2020 PCP: [name], DO   Brief Narrative:  [name] Gimenez Joyceis a 75 y.o.malewith medical history significant ofcoronary artery disease, atrial fibrillation (chronically on Eliquis), chronic kidney disease a stage IIIb, hypertension, hyperlipidemia, hypothyroidism, morbid obesity, diastolic heart failure, type 2 diabetes mellitus with nephropathy and history of non-Hodgkin lymphoma;who presented to the hospital secondary to increased shortness of breath, orthopnea and lower extremity swelling.  Patient was admitted with acute on chronic diastolic heart failure exacerbation and has been receiving IV Lasix and initially some metolazone for diuresis.  He is noted to have some AKI on CKD stage IIIb with uptrend in creatinine noted for which Lasix has been held.  He has been started on some IV fluid to help with his uptrending creatinine and worsening AKI.  Assessment & Plan:   Active Problems:   Acute on chronic diastolic HF (heart failure) (HCC)   1-acute on chronic diastolic heart failure-improved -Baseline weight near 300 pounds and patient currently at 299, no further diuresis at this time -Preserved ejection fraction appreciating most recent echo -Hold further diuresis given creatinine up trended, trend gentle IV fluid for AKI -Continue to follow daily weights and strict intake and output -Continue low-sodium diet. -Follow clinical response. -Still complaining of short winded sensation, orthopnea and is requiring 4 L nasal cannula supplementation.  2-acute kidney injury: Chronic kidney disease a stage IIIb -Baseline creatinine 2.3-2.4, currently 3.6 -In the setting of diuresis -Hold further diuretics on 3/9 -Started on IV fluid 3/10 -Renal ultrasound only 3/10, pending -Recheck labs in a.m. to ensure stability prior to discharge -If creatinine continues to trend upwards, may consider nephrology  evaluation or further monitoring  3-type 2 diabetes with nephropathy -Continue sliding scale insulin and Levemir -Follow CBGs and adjust regimen as required.  4-history of non-Hodgkin lymphoma -Continue patient follow-up with oncology service.  5-history of atrial fibrillation -Rate controlled -Hold beta-blocker given sinus pauses -Continue telemetry monitoring. -Continue Eliquis for secondary prevention.  6-essential hypertension -Appears to be stable -Continue to follow vital signs and adjust antihypertensive regimen as required.  7-history of coronary artery disease -No chest pain -EKG without acute ischemic changes -Will continue treatment with aspirin, statins, beta-blocker and Imdur  8-hypothyroidism -Continue Synthroid.  9-hyperlipidemia -Continue statins  10-history of COPD -No wheezing appreciated on exam -Continue home rescue/maintenance bronchodilator management.  11-morbid obesity -Low calorie diet, portion control and increase physical activity discussed with patient. -Body mass index is 39.47 kg/m.  12-history of thrombocytopenia -Appears to be stable and at baseline -No signs of overt bleeding -Continue to follow platelets trend, plan to recheck CBC in a.m.   DVT prophylaxis:Eliquis Code Status: Full Family Communication: Tried calling daughter with no response 3/10 Disposition Plan:  Status is: Inpatient  Remains inpatient appropriate because:IV treatments appropriate due to intensity of illness or inability to take PO and Inpatient level of care appropriate due to severity of illness   Dispo: The patient is from: Home  Anticipated d/c is to: Home  Patient currently is not medically stable to d/c.              Difficult to place patient No  Consultants:   None  Procedures:   See below  Antimicrobials:   None  Subjective: Patient seen and evaluated today with no new acute complaints or  concerns. No acute concerns or events noted overnight.  He is eager to go home and denies any significant dyspnea.  Unfortunately, his creatinine continues to trend up.  Objective: Vitals:   10/09/20 2023 10/10/20 0457 10/10/20 0500 10/10/20 0815  BP: 114/71 117/84    Pulse: (!) 55 75    Resp: 20 18    Temp: (!) 97.5 F (36.4 C) 98 F (36.7 C)    TempSrc:      SpO2: 95% 93%  95%  Weight:   135.8 kg   Height:        Intake/Output Summary (Last 24 hours) at 10/10/2020 1209 Last data filed at 10/10/2020 0900 Gross per 24 hour  Intake 960 ml  Output 475 ml  Net 485 ml   Filed Weights   10/08/20 0500 10/09/20 0536 10/10/20 0500  Weight: 135.7 kg 134.2 kg 135.8 kg    Examination:  General exam: Appears calm and comfortable, obese Respiratory system: Clear to auscultation. Respiratory effort normal.  Currently on 4 L nasal cannula oxygen. Cardiovascular system: S1 & S2 heard, RRR.  Gastrointestinal system: Abdomen is soft Central nervous system: Alert and awake Extremities: No edema Skin: No significant lesions noted Psychiatry: Flat affect.    Data Reviewed: I have personally reviewed following labs and imaging studies  CBC: Recent Labs  Lab 10/06/20 1201 10/10/20 0522  WBC 6.0 4.7  HGB 15.7 14.7  HCT 49.9 46.4  MCV 91.9 90.1  PLT 125* 093*   Basic Metabolic Panel: Recent Labs  Lab 10/06/20 1201 10/07/20 0539 10/08/20 0533 10/09/20 0557 10/10/20 0522  NA 141 141 140 138 138  K 3.9 3.5 3.0* 3.5 3.1*  CL 104 104 100 100 99  CO2 25 27 27 24 24   GLUCOSE 91 102* 104* 136* 107*  BUN 30* 37* 50* 65* 81*  CREATININE 2.66* 2.95* 3.08* 3.15* 3.62*  CALCIUM 9.1 8.8* 9.0 8.7* 8.3*  MG 2.3  --   --   --  2.2   GFR: Estimated Creatinine Clearance: 26.3 mL/min (A) (by C-G formula based on SCr of 3.62 mg/dL (H)). Liver Function Tests: Recent Labs  Lab 10/06/20 1201  AST 10*  ALT 10  ALKPHOS 73  BILITOT 1.4*  PROT 7.6  ALBUMIN 4.4   No results for  input(s): LIPASE, AMYLASE in the last 168 hours. No results for input(s): AMMONIA in the last 168 hours. Coagulation Profile: Recent Labs  Lab 10/06/20 1201  INR 1.2   Cardiac Enzymes: No results for input(s): CKTOTAL, CKMB, CKMBINDEX, TROPONINI in the last 168 hours. BNP (last 3 results) No results for input(s): PROBNP in the last 8760 hours. HbA1C: No results for input(s): HGBA1C in the last 72 hours. CBG: Recent Labs  Lab 10/09/20 1108 10/09/20 1528 10/09/20 1643 10/09/20 2031 10/10/20 0726  GLUCAP 144* 123* 115* 128* 110*   Lipid Profile: No results for input(s): CHOL, HDL, LDLCALC, TRIG, CHOLHDL, LDLDIRECT in the last 72 hours. Thyroid Function Tests: No results for input(s): TSH, T4TOTAL, FREET4, T3FREE, THYROIDAB in the last 72 hours. Anemia Panel: No results for input(s): VITAMINB12, FOLATE, FERRITIN, TIBC, IRON, RETICCTPCT in the last 72 hours. Sepsis Labs: No results for input(s): PROCALCITON, LATICACIDVEN in the last 168 hours.  Recent Results (from the past 240 hour(s))  Resp Panel by RT-PCR (Flu A&B, Covid) Nasopharyngeal Swab     Status: None   Collection Time: 10/06/20 12:05 PM   Specimen: Nasopharyngeal Swab; Nasopharyngeal(NP) swabs in vial transport medium  Result Value Ref Range Status   SARS Coronavirus 2 by RT PCR NEGATIVE NEGATIVE Final    Comment: (NOTE) SARS-CoV-2 target nucleic acids are  NOT DETECTED.  The SARS-CoV-2 RNA is generally detectable in upper respiratory specimens during the acute phase of infection. The lowest concentration of SARS-CoV-2 viral copies this assay can detect is 138 copies/mL. A negative result does not preclude SARS-Cov-2 infection and should not be used as the sole basis for treatment or other patient management decisions. A negative result may occur with  improper specimen collection/handling, submission of specimen other than nasopharyngeal swab, presence of viral mutation(s) within the areas targeted by this  assay, and inadequate number of viral copies(<138 copies/mL). A negative result must be combined with clinical observations, patient history, and epidemiological information. The expected result is Negative.  Fact Sheet for Patients:  EntrepreneurPulse.com.au  Fact Sheet for Healthcare Providers:  IncredibleEmployment.be  This test is no t yet approved or cleared by the [place] FDA and  has been authorized for detection and/or diagnosis of SARS-CoV-2 by FDA under an Emergency Use Authorization (EUA). This EUA will remain  in effect (meaning this test can be used) for the duration of the COVID-19 declaration under Section 564(b)(1) of the Act, 21 U.S.C.section 360bbb-3(b)(1), unless the authorization is terminated  or revoked sooner.       Influenza A by PCR NEGATIVE NEGATIVE Final   Influenza B by PCR NEGATIVE NEGATIVE Final    Comment: (NOTE) The Xpert Xpress SARS-CoV-2/FLU/RSV plus assay is intended as an aid in the diagnosis of influenza from Nasopharyngeal swab specimens and should not be used as a sole basis for treatment. Nasal washings and aspirates are unacceptable for Xpert Xpress SARS-CoV-2/FLU/RSV testing.  Fact Sheet for Patients: EntrepreneurPulse.com.au  Fact Sheet for Healthcare Providers: IncredibleEmployment.be  This test is not yet approved or cleared by the [place] FDA and has been authorized for detection and/or diagnosis of SARS-CoV-2 by FDA under an Emergency Use Authorization (EUA). This EUA will remain in effect (meaning this test can be used) for the duration of the COVID-19 declaration under Section 564(b)(1) of the Act, 21 U.S.C. section 360bbb-3(b)(1), unless the authorization is terminated or revoked.  Performed at [facility], [address] [place], [place] [address]          Radiology Studies: No results found.      Scheduled Meds: . apixaban   2.5 mg Oral BID  . aspirin EC  81 mg Oral Daily  . fluticasone furoate-vilanterol  1 puff Inhalation Daily  . insulin aspart  0-5 Units Subcutaneous QHS  . insulin aspart  0-9 Units Subcutaneous TID WC  . levothyroxine  300 mcg Oral QAC breakfast  . loratadine  10 mg Oral Daily  . nystatin   Topical BID  . potassium chloride  40 mEq Oral BID  . promethazine  12.5 mg Intravenous Once  . simvastatin  5 mg Oral q1800  . sodium chloride flush  3 mL Intravenous Q12H   Continuous Infusions: . sodium chloride    . lactated ringers 75 mL/hr at 10/10/20 0955     LOS: 4 days    Time spent: 35 minutes    Pratik D Manuella Ghazi, DO [facility]  If 7PM-7AM, please contact night-coverage [email or web address] 10/10/2020, 12:09 PM

## 2020-10-11 DIAGNOSIS — I5033 Acute on chronic diastolic (congestive) heart failure: Secondary | ICD-10-CM | POA: Diagnosis not present

## 2020-10-11 LAB — BASIC METABOLIC PANEL
Anion gap: 14 (ref 5–15)
BUN: 80 mg/dL — ABNORMAL HIGH (ref 8–23)
CO2: 25 mmol/L (ref 22–32)
Calcium: 8.5 mg/dL — ABNORMAL LOW (ref 8.9–10.3)
Chloride: 98 mmol/L (ref 98–111)
Creatinine, Ser: 3.12 mg/dL — ABNORMAL HIGH (ref 0.61–1.24)
GFR, Estimated: 20 mL/min — ABNORMAL LOW (ref >60)
Glucose, Bld: 123 mg/dL — ABNORMAL HIGH (ref 70–99)
Potassium: 3.2 mmol/L — ABNORMAL LOW (ref 3.5–5.1)
Sodium: 137 mmol/L (ref 135–145)

## 2020-10-11 LAB — GLUCOSE, CAPILLARY
Glucose-Capillary: 110 mg/dL — ABNORMAL HIGH (ref 70–99)
Glucose-Capillary: 149 mg/dL — ABNORMAL HIGH (ref 70–99)
Glucose-Capillary: 121 mg/dL — ABNORMAL HIGH (ref 70–99)
Glucose-Capillary: 142 mg/dL — ABNORMAL HIGH (ref 70–99)

## 2020-10-11 LAB — MAGNESIUM: Magnesium: 2.3 mg/dL (ref 1.7–2.4)

## 2020-10-11 MED ORDER — NYSTATIN 100000 UNIT/GM EX POWD: Freq: Two times a day (BID) | CUTANEOUS | 0 refills | Status: DC

## 2020-10-11 MED ORDER — POTASSIUM CHLORIDE CRYS ER 20 MEQ PO TBCR
40.0000 meq | EXTENDED_RELEASE_TABLET | Freq: Once | ORAL | Status: AC
  Administered 2020-10-11: 40 meq via ORAL
  Filled 2020-10-11: qty 2

## 2020-10-11 MED ORDER — FUROSEMIDE 80 MG PO TABS: 80.0000 mg | ORAL_TABLET | Freq: Every day | ORAL | 1 refills | Status: DC | PRN

## 2020-10-11 NOTE — Care Management Important Message (Addendum)
Important Message  Patient Details  Name: [name] [name] MRN: [id number] Date of Birth: 1946/04/11   Medicare Important Message Given:  Yes  Late entry, delivered 10/11/20, 11:15am   Tommy Medal 10/11/2020, 1:31 PM

## 2020-10-11 NOTE — Progress Notes (Signed)
Nsg Discharge Note  Admit Date:  10/06/2020 Discharge date: 10/11/2020   [name] [name] to be D/C'd Home per MD order.  AVS completed.   Patient able to verbalize understanding.  Discharge Medication: Allergies as of 10/11/2020   No Known Allergies     Medication List    STOP taking these medications   bisoprolol 5 MG tablet Commonly known as: ZEBETA   losartan 100 MG tablet Commonly known as: COZAAR     TAKE these medications   acetaminophen 325 MG tablet Commonly known as: TYLENOL Take 2 tablets (650 mg total) by mouth every 6 (six) hours as needed for mild pain (or Fever >/= 101).   albuterol 108 (90 Base) MCG/ACT inhaler Commonly known as: VENTOLIN HFA Inhale 1-2 puffs into the lungs every 6 (six) hours as needed for wheezing or shortness of breath.   amLODipine 10 MG tablet Commonly known as: NORVASC Take 10 mg by mouth daily.   apixaban 2.5 MG Tabs tablet Commonly known as: Eliquis Take 1 tablet (2.5 mg total) by mouth 2 (two) times daily.   aspirin EC 81 MG tablet Take 81 mg by mouth daily. Swallow whole.   fluticasone 50 MCG/ACT nasal spray Commonly known as: FLONASE Place 2 sprays into both nostrils as needed for up to 14 days.   furosemide 80 MG tablet Commonly known as: LASIX Take 1 tablet (80 mg total) by mouth daily as needed for fluid or edema. What changed:   when to take this  reasons to take this   insulin degludec 100 UNIT/ML FlexTouch Pen Commonly known as: TRESIBA Inject 56 Units into the skin daily at 10 pm.   ipratropium-albuterol 0.5-2.5 (3) MG/3ML Soln Commonly known as: DUONEB Inhale 3 mLs into the lungs every 4 (four) hours as needed (shortness of breath).   isosorbide mononitrate 30 MG 24 hr tablet Commonly known as: IMDUR Take 1 tablet (30 mg total) by mouth daily.   levocetirizine 5 MG tablet Commonly known as: XYZAL Take 5 mg by mouth every evening.   levothyroxine 200 MCG tablet Commonly known as: SYNTHROID Take 300  mcg by mouth daily before breakfast. (takes with 159mcg for a total of 369mcg)   levothyroxine 100 MCG tablet Commonly known as: SYNTHROID Take 100 mcg by mouth daily. (takes with 29mcg for a total of 352mcg)   nitroGLYCERIN 0.4 MG SL tablet Commonly known as: NITROSTAT Place 0.4 mg under the tongue every 5 (five) minutes as needed for chest pain.   nystatin powder Commonly known as: MYCOSTATIN/NYSTOP Apply topically 2 (two) times daily.   OXYGEN Inhale 3 L into the lungs daily as needed.   potassium chloride SA 20 MEQ tablet Commonly known as: KLOR-CON Take 1 tablet (20 mEq total) by mouth 2 (two) times daily.   senna-docusate 8.6-50 MG tablet Commonly known as: Senokot-S Take 1 tablet by mouth daily as needed for mild constipation.   simvastatin 5 MG tablet Commonly known as: ZOCOR Take 5 mg by mouth daily.   Symbicort 160-4.5 MCG/ACT inhaler Generic drug: budesonide-formoterol Inhale 2 puffs into the lungs daily.            Durable Medical Equipment  (From admission, onward)         Start     Ordered   10/11/20 0920  DME Shower stool  Once        10/11/20 0920   10/11/20 0920  DME Tub Bench  Once        10/11/20 8916  Discharge Assessment: Vitals:   10/11/20 0403 10/11/20 0751  BP: 131/86   Pulse: (!) 56   Resp:    Temp:    SpO2: 97% 97%   Skin clean, dry and intact without evidence of skin break down, no evidence of skin tears noted. IV catheter discontinued intact. Site without signs and symptoms of complications - no redness or edema noted at insertion site, patient denies c/o pain - only slight tenderness at site.  Dressing with slight pressure applied.  D/c Instructions-Education: Discharge instructions given to patient with verbalized understanding. D/c education completed with patient including follow up instructions, medication list, d/c activities limitations if indicated, with other d/c instructions as indicated by MD - patient  able to verbalize understanding, all questions fully answered. Patient instructed to return to ED, call 911, or call MD for any changes in condition.  Patient escorted via [place], and D/C home via private auto.  [name] Alarid Loletha Grayer, RN 10/11/2020 10:16 AM

## 2020-10-11 NOTE — Progress Notes (Signed)
RN informed by Central tele monitoring that pt had a trigemeny, monitor reviewed. Vital signs taken, patient denies any chestpain. MD made aware. Continue to monitor per MD.

## 2020-10-11 NOTE — TOC Transition Note (Signed)
Transition of Care [facility]) - CM/SW Discharge Note   Patient Details  Name: OAKLEY KOSSMAN MRN: [id number] Date of Birth: 22-Apr-1946  Transition of Care [facility]) CM/SW Contact:  Natasha Bence, LCSW Phone Number: 10/11/2020, 10:51 AM   Clinical Narrative:    CSW notified of patient's readiness for discharge. CSW notified Marjory Lies with [facility]. Marjory Lies agreeable to resume HH. TOC signing off.   Final next level of care: [place] Barriers to Discharge: Barriers Resolved   Patient Goals and CMS Choice Patient states their goals for this hospitalization and ongoing recovery are:: Return home with [facility] CMS Medicare.gov Compare Post Acute Care list provided to:: Patient Choice offered to / list presented to : Patient  Discharge Placement                    Patient and family notified of of transfer: 10/11/20  Discharge Plan and Services                DME Arranged: N/A DME Agency: NA       HH Arranged: RN,PT [place] Agency: [facility] [employer] [facility] [employer] Date [place]: 10/11/20 Time [place]: [place] Representative spoke with at [place]: [place] ([place]) Interventions     Readmission Risk Interventions Readmission Risk Prevention Plan 10/07/2020 08/05/2020 06/26/2020  Transportation Screening Complete Complete Complete  Medication Review Press photographer) Complete Complete Complete  PCP or Specialist appointment within 3-5 days of discharge - - Complete  HRI or Home Care Consult Complete Complete Complete  SW Recovery Care/Counseling Consult Complete Complete Complete  Palliative Care Screening Not Applicable Not Applicable Not [place] Not Applicable Not Applicable Complete  Some recent data might be hidden

## 2020-10-11 NOTE — Discharge Summary (Signed)
Physician Discharge Summary  [name] [name] NKN:[id number] DOB: 1946-05-08 DOA: 10/06/2020  PCP: [name], DO  Admit date: 10/06/2020  Discharge date: 10/11/2020  Admitted From:Home  Disposition:  Home  Recommendations for Outpatient Follow-up:  1. Follow up with PCP in 1-2 weeks 2. Please obtain BMP in one week 3. Follow-up with cardiology Dr. Domenic Polite as scheduled on 3/22 at 2:20 PM 4. Hold further use of bisoprolol indefinitely due to issues of bradycardia 5. Continue to use Lasix only as needed for worsening swelling and 3 pound weight gain over 24 hours as explained to the patient until further BMP study has been done 6. Hold losartan until BMP recheck in 1 week to ensure creatinine levels have improved to baseline 7. Continue other medications as noted below  Home Health:None  Equipment/Devices: Continue home oxygen 3 L nasal cannula  Discharge Condition:Stable  CODE STATUS: Full  Diet recommendation: Heart Healthy/carb modified  Brief/Interim Summary: [name] Joung Joyceis a 75 y.o.malewith medical history significant ofcoronary artery disease, atrial fibrillation (chronically on Eliquis), chronic kidney disease a stage IIIb, hypertension, hyperlipidemia, hypothyroidism, morbid obesity, diastolic heart failure, type 2 diabetes mellitus with nephropathy and history of non-Hodgkin lymphoma;who presented to the hospital secondary to increased shortness of breath, orthopnea and lower extremity swelling.Patient was admitted with acute on chronic diastolic heart failure exacerbation and has been receiving IV Lasix and initially some metolazone for diuresis. He is noted to have some AKI on CKD stage IIIbwith uptrend in creatinine noted for which Lasix had been held for several days.  He also noted some IV fluid to help his creatinine levels trend down.  His creatinine is currently 3.1 and at baseline he is near 2.3-2.4.  His trend is currently improving and he is nonoliguric and  overall safe for discharge.  It has been explained to him that he needs to avoid use of his losartan and only take Lasix as needed until he has further follow-up with his PCP and recheck a BMP in 1 week.  He was also noted to have some issues with bradycardia during this admission for which his bisoprolol has been discontinued.  This was also previously recommended by his cardiologist Dr. Domenic Polite whom he has been instructed to follow-up with in less than 2 weeks.  Discharge Diagnoses:  Active Problems:   Acute on chronic diastolic HF (heart failure) (HCC)  Principal discharge diagnosis: Acute on chronic diastolic congestive heart failure exacerbation with eventual AKI on CKD stage IIIb due to aggressive diuresis.  Discharge Instructions  Discharge Instructions    Diet - low sodium heart healthy   Complete by: As directed    Increase activity slowly   Complete by: As directed      Allergies as of 10/11/2020   No Known Allergies     Medication List    STOP taking these medications   bisoprolol 5 MG tablet Commonly known as: ZEBETA   losartan 100 MG tablet Commonly known as: COZAAR     TAKE these medications   acetaminophen 325 MG tablet Commonly known as: TYLENOL Take 2 tablets (650 mg total) by mouth every 6 (six) hours as needed for mild pain (or Fever >/= 101).   albuterol 108 (90 Base) MCG/ACT inhaler Commonly known as: VENTOLIN HFA Inhale 1-2 puffs into the lungs every 6 (six) hours as needed for wheezing or shortness of breath.   amLODipine 10 MG tablet Commonly known as: NORVASC Take 10 mg by mouth daily.   apixaban 2.5 MG Tabs  tablet Commonly known as: Eliquis Take 1 tablet (2.5 mg total) by mouth 2 (two) times daily.   aspirin EC 81 MG tablet Take 81 mg by mouth daily. Swallow whole.   fluticasone 50 MCG/ACT nasal spray Commonly known as: FLONASE Place 2 sprays into both nostrils as needed for up to 14 days.   furosemide 80 MG tablet Commonly known as:  LASIX Take 1 tablet (80 mg total) by mouth daily as needed for fluid or edema. What changed:   when to take this  reasons to take this   insulin degludec 100 UNIT/ML FlexTouch Pen Commonly known as: TRESIBA Inject 56 Units into the skin daily at 10 pm.   ipratropium-albuterol 0.5-2.5 (3) MG/3ML Soln Commonly known as: DUONEB Inhale 3 mLs into the lungs every 4 (four) hours as needed (shortness of breath).   isosorbide mononitrate 30 MG 24 hr tablet Commonly known as: IMDUR Take 1 tablet (30 mg total) by mouth daily.   levocetirizine 5 MG tablet Commonly known as: XYZAL Take 5 mg by mouth every evening.   levothyroxine 200 MCG tablet Commonly known as: SYNTHROID Take 300 mcg by mouth daily before breakfast. (takes with 166mcg for a total of 361mcg)   levothyroxine 100 MCG tablet Commonly known as: SYNTHROID Take 100 mcg by mouth daily. (takes with 228mcg for a total of 358mcg)   nitroGLYCERIN 0.4 MG SL tablet Commonly known as: NITROSTAT Place 0.4 mg under the tongue every 5 (five) minutes as needed for chest pain.   nystatin powder Commonly known as: MYCOSTATIN/NYSTOP Apply topically 2 (two) times daily.   OXYGEN Inhale 3 L into the lungs daily as needed.   potassium chloride SA 20 MEQ tablet Commonly known as: KLOR-CON Take 1 tablet (20 mEq total) by mouth 2 (two) times daily.   senna-docusate 8.6-50 MG tablet Commonly known as: Senokot-S Take 1 tablet by mouth daily as needed for mild constipation.   simvastatin 5 MG tablet Commonly known as: ZOCOR Take 5 mg by mouth daily.   Symbicort 160-4.5 MCG/ACT inhaler Generic drug: budesonide-formoterol Inhale 2 puffs into the lungs daily.            Durable Medical Equipment  (From admission, onward)         Start     Ordered   10/11/20 0920  DME Shower stool  Once        10/11/20 0920   10/11/20 0920  DME Tub Bench  Once        10/11/20 0920          Follow-up Information    Satira Sark, MD Follow up on 10/22/2020.   Specialty: Cardiology Why: [facility]iology Hospital Follow-up on 10/22/2020 at 2:20 PM.  Contact information: [place] [address] [phone]        Lazoff, Shawn P, DO Follow up in 1 week(s).   Specialty: Family Medicine Contact information: [address] [place] [address] [phone]              No Known Allergies  Consultations:  None   Procedures/Studies: US RENAL  Result Date: 10/10/2020 CLINICAL DATA:  Acute kidney injury.  Lymphoma EXAM: RENAL / URINARY TRACT ULTRASOUND COMPLETE COMPARISON:  CT abdomen pelvis 01/28/2020 FINDINGS: Right Kidney: Renal measurements: 9.3 x 4.4 x 7.8 cm = volume: 164 mL. Echogenicity within normal limits. No mass or hydronephrosis visualized. Left Kidney: Renal measurements: 9.1 x 3.7 x 4.5 cm = volume: 81 mL.  Echogenicity within normal limits. No mass or hydronephrosis visualized. Bladder: Empty bladder. Other: Exam quality limited by obesity. IMPRESSION: No renal obstruction.  Left kidney smaller than right. Electronically Signed   By: Franchot Gallo M.D.   On: 10/10/2020 13:11   DG Chest Port 1 View  Result Date: 10/06/2020 CLINICAL DATA:  Shortness of breath.  Swelling in both legs. EXAM: PORTABLE CHEST 1 VIEW COMPARISON:  August 04, 2020 FINDINGS: No pneumothorax. Stable cardiomegaly. The hila and mediastinum are unremarkable. Increased interstitial opacities in the lungs, right greater than left. No other acute abnormalities. IMPRESSION: Findings are most consistent with cardiomegaly and mildly asymmetric pulmonary edema. Electronically Signed   By: Dorise Bullion III M.D   On: 10/06/2020 12:53      Discharge Exam: Vitals:   10/11/20 0403 10/11/20 0751  BP: 131/86   Pulse: (!) 56   Resp:    Temp:    SpO2: 97% 97%   Vitals:   10/11/20 0338 10/11/20 0403 10/11/20 0500 10/11/20 0751  BP:  131/86    Pulse:  (!) 56    Resp:      Temp:      TempSrc:      SpO2:  97%   97%  Weight: 135.9 kg  132.4 kg   Height:        General: Pt is alert, awake, not in acute distress, obese Cardiovascular: RRR, S1/S2 +, no rubs, no gallops Respiratory: CTA bilaterally, no wheezing, no rhonchi, on 3 L nasal cannula oxygen Abdominal: Soft, NT, ND, bowel sounds + Extremities: no edema, no cyanosis    The results of significant diagnostics from this hospitalization (including imaging, microbiology, ancillary and laboratory) are listed below for reference.     Microbiology: Recent Results (from the past 240 hour(s))  Resp Panel by RT-PCR (Flu A&B, Covid) Nasopharyngeal Swab     Status: None   Collection Time: 10/06/20 12:05 PM   Specimen: Nasopharyngeal Swab; Nasopharyngeal(NP) swabs in vial transport medium  Result Value Ref Range Status   SARS Coronavirus 2 by RT PCR NEGATIVE NEGATIVE Final    Comment: (NOTE) SARS-CoV-2 target nucleic acids are NOT DETECTED.  The SARS-CoV-2 RNA is generally detectable in upper respiratory specimens during the acute phase of infection. The lowest concentration of SARS-CoV-2 viral copies this assay can detect is 138 copies/mL. A negative result does not preclude SARS-Cov-2 infection and should not be used as the sole basis for treatment or other patient management decisions. A negative result may occur with  improper specimen collection/handling, submission of specimen other than nasopharyngeal swab, presence of viral mutation(s) within the areas targeted by this assay, and inadequate number of viral copies(<138 copies/mL). A negative result must be combined with clinical observations, patient history, and epidemiological information. The expected result is Negative.  Fact Sheet for Patients:  EntrepreneurPulse.com.au  Fact Sheet for Healthcare Providers:  IncredibleEmployment.be  This test is no t yet approved or cleared by the [place] FDA and  has been authorized for detection  and/or diagnosis of SARS-CoV-2 by FDA under an Emergency Use Authorization (EUA). This EUA will remain  in effect (meaning this test can be used) for the duration of the COVID-19 declaration under Section 564(b)(1) of the Act, 21 U.S.C.section 360bbb-3(b)(1), unless the authorization is terminated  or revoked sooner.       Influenza A by PCR NEGATIVE NEGATIVE Final   Influenza B by PCR NEGATIVE NEGATIVE Final    Comment: (NOTE) The Xpert Xpress SARS-CoV-2/FLU/RSV plus assay is  intended as an aid in the diagnosis of influenza from Nasopharyngeal swab specimens and should not be used as a sole basis for treatment. Nasal washings and aspirates are unacceptable for Xpert Xpress SARS-CoV-2/FLU/RSV testing.  Fact Sheet for Patients: EntrepreneurPulse.com.au  Fact Sheet for Healthcare Providers: IncredibleEmployment.be  This test is not yet approved or cleared by the [place] FDA and has been authorized for detection and/or diagnosis of SARS-CoV-2 by FDA under an Emergency Use Authorization (EUA). This EUA will remain in effect (meaning this test can be used) for the duration of the COVID-19 declaration under Section 564(b)(1) of the Act, 21 U.S.C. section 360bbb-3(b)(1), unless the authorization is terminated or revoked.  Performed at [facility], [address] [place], [place] [address]      Labs: BNP (last 3 results) Recent Labs    07/19/20 1720 08/04/20 1451 10/06/20 1201  BNP 182.0* 231.0* 245.8*   Basic Metabolic Panel: Recent Labs  Lab 10/06/20 1201 10/07/20 0539 10/08/20 0533 10/09/20 0557 10/10/20 0522 10/11/20 0527  NA 141 141 140 138 138 137  K 3.9 3.5 3.0* 3.5 3.1* 3.2*  CL 104 104 100 100 99 98  CO2 25 27 27 24 24 25   GLUCOSE 91 102* 104* 136* 107* 123*  BUN 30* 37* 50* 65* 81* 80*  CREATININE 2.66* 2.95* 3.08* 3.15* 3.62* 3.12*  CALCIUM 9.1 8.8* 9.0 8.7* 8.3* 8.5*  MG 2.3  --   --   --  2.2 2.3   Liver  Function Tests: Recent Labs  Lab 10/06/20 1201  AST 10*  ALT 10  ALKPHOS 73  BILITOT 1.4*  PROT 7.6  ALBUMIN 4.4   No results for input(s): LIPASE, AMYLASE in the last 168 hours. No results for input(s): AMMONIA in the last 168 hours. CBC: Recent Labs  Lab 10/06/20 1201 10/10/20 0522  WBC 6.0 4.7  HGB 15.7 14.7  HCT 49.9 46.4  MCV 91.9 90.1  PLT 125* 111*   Cardiac Enzymes: No results for input(s): CKTOTAL, CKMB, CKMBINDEX, TROPONINI in the last 168 hours. BNP: Invalid input(s): POCBNP CBG: Recent Labs  Lab 10/09/20 1528 10/09/20 1643 10/09/20 2031 10/10/20 0726 10/10/20 1121  GLUCAP 123* 115* 128* 110* 128*   D-Dimer No results for input(s): DDIMER in the last 72 hours. Hgb A1c No results for input(s): HGBA1C in the last 72 hours. Lipid Profile No results for input(s): CHOL, HDL, LDLCALC, TRIG, CHOLHDL, LDLDIRECT in the last 72 hours. Thyroid function studies No results for input(s): TSH, T4TOTAL, T3FREE, THYROIDAB in the last 72 hours.  Invalid input(s): FREET3 Anemia work up No results for input(s): VITAMINB12, FOLATE, FERRITIN, TIBC, IRON, RETICCTPCT in the last 72 hours. Urinalysis    Component Value Date/Time   COLORURINE STRAW (A) 07/19/2020 2255   APPEARANCEUR CLEAR 07/19/2020 2255   LABSPEC 1.005 07/19/2020 2255   PHURINE 7.0 07/19/2020 2255   GLUCOSEU NEGATIVE 07/19/2020 2255   HGBUR NEGATIVE 07/19/2020 2255   BILIRUBINUR NEGATIVE 07/19/2020 2255   KETONESUR NEGATIVE 07/19/2020 2255   PROTEINUR NEGATIVE 07/19/2020 2255   UROBILINOGEN 4.0 (H) 07/23/2014 1356   NITRITE NEGATIVE 07/19/2020 2255   LEUKOCYTESUR NEGATIVE 07/19/2020 2255   Sepsis Labs Invalid input(s): PROCALCITONIN,  WBC,  LACTICIDVEN Microbiology Recent Results (from the past 240 hour(s))  Resp Panel by RT-PCR (Flu A&B, Covid) Nasopharyngeal Swab     Status: None   Collection Time: 10/06/20 12:05 PM   Specimen: Nasopharyngeal Swab; Nasopharyngeal(NP) swabs in vial transport  medium  Result Value Ref Range Status  SARS Coronavirus 2 by RT PCR NEGATIVE NEGATIVE Final    Comment: (NOTE) SARS-CoV-2 target nucleic acids are NOT DETECTED.  The SARS-CoV-2 RNA is generally detectable in upper respiratory specimens during the acute phase of infection. The lowest concentration of SARS-CoV-2 viral copies this assay can detect is 138 copies/mL. A negative result does not preclude SARS-Cov-2 infection and should not be used as the sole basis for treatment or other patient management decisions. A negative result may occur with  improper specimen collection/handling, submission of specimen other than nasopharyngeal swab, presence of viral mutation(s) within the areas targeted by this assay, and inadequate number of viral copies(<138 copies/mL). A negative result must be combined with clinical observations, patient history, and epidemiological information. The expected result is Negative.  Fact Sheet for Patients:  EntrepreneurPulse.com.au  Fact Sheet for Healthcare Providers:  IncredibleEmployment.be  This test is no t yet approved or cleared by the [place] FDA and  has been authorized for detection and/or diagnosis of SARS-CoV-2 by FDA under an Emergency Use Authorization (EUA). This EUA will remain  in effect (meaning this test can be used) for the duration of the COVID-19 declaration under Section 564(b)(1) of the Act, 21 U.S.C.section 360bbb-3(b)(1), unless the authorization is terminated  or revoked sooner.       Influenza A by PCR NEGATIVE NEGATIVE Final   Influenza B by PCR NEGATIVE NEGATIVE Final    Comment: (NOTE) The Xpert Xpress SARS-CoV-2/FLU/RSV plus assay is intended as an aid in the diagnosis of influenza from Nasopharyngeal swab specimens and should not be used as a sole basis for treatment. Nasal washings and aspirates are unacceptable for Xpert Xpress SARS-CoV-2/FLU/RSV testing.  Fact Sheet for  Patients: EntrepreneurPulse.com.au  Fact Sheet for Healthcare Providers: IncredibleEmployment.be  This test is not yet approved or cleared by the [place] FDA and has been authorized for detection and/or diagnosis of SARS-CoV-2 by FDA under an Emergency Use Authorization (EUA). This EUA will remain in effect (meaning this test can be used) for the duration of the COVID-19 declaration under Section 564(b)(1) of the Act, 21 U.S.C. section 360bbb-3(b)(1), unless the authorization is terminated or revoked.  Performed at [facility], [address] [place], [place] [address]      Time coordinating discharge: 35 minutes  SIGNED:   Rodena Goldmann, DO [facility] 10/11/2020, 9:21 AM  If 7PM-7AM, please contact night-coverage [email or web address]

## 2020-10-17 ENCOUNTER — Telehealth: Payer: Self-pay | Admitting: Adult Health

## 2020-10-17 NOTE — Telephone Encounter (Signed)
Called and [facility] for patient to return my call regarding [place] referral.  Wilber Bihari, NP

## 2020-10-22 ENCOUNTER — Ambulatory Visit: Payer: Medicare HMO | Admitting: Cardiology

## 2020-10-22 NOTE — Progress Notes (Deleted)
Cardiology Office Note  Date: 10/22/2020   ID: [name] [name], [name] [name] 1945-11-21, MRN [id number]  PCP:  [name], DO  Cardiologist:  Rozann Lesches, MD Electrophysiologist:  None   No chief complaint on file.   History of Present Illness: [name] [name] is a 75 y.o. male last seen in January by Mr. Leonides Sake NP.  He was recently hospitalized with acute on chronic diastolic heart failure.  He developed acute on chronic renal insufficiency with CKD stage III at baseline, was taken off ARB and instructed to use Lasix on an as-needed basis.  Bisoprolol was also discontinued due to bradycardia.  Past Medical History:  Diagnosis Date  . Atrial fibrillation ([place])   . CAD (coronary artery disease)    DES to LAD June 2018  . CKD (chronic kidney disease) stage 3, GFR 30-59 ml/min (HCC)   . Essential hypertension   . History of pneumonia   . Hyperlipidemia   . Hypothyroidism   . LBBB (left bundle branch block)   . Morbid obesity ([place])   . Non Hodgkin's lymphoma ([place])    Status post XRT and chemotherapy  . NSTEMI (non-ST elevated myocardial infarction) [facility])    June 2018  . Peripheral neuropathy   . Pneumonia due to COVID-19 virus   . Sleep apnea   . Type 2 diabetes mellitus ([place])     Past Surgical History:  Procedure Laterality Date  . CHOLECYSTECTOMY  1992  . COLONOSCOPY N/A 09/03/2014   SLF:six colon polyps removed/small internal hemorrhoids  . CORONARY STENT INTERVENTION N/A 01/21/2017   Procedure: Coronary Stent Intervention;  Surgeon: Nelva Bush, MD;  Location: [place] [facility];  Service: Cardiovascular;  Laterality: N/A;  . ESOPHAGOGASTRODUODENOSCOPY N/A 09/03/2014   SLF: mild gastritis/few gastric polyps  . LEFT HEART CATH AND CORONARY ANGIOGRAPHY N/A 01/20/2017   Procedure: Left Heart Cath and Coronary Angiography;  Surgeon: Jettie Booze, MD;  Location: [place] [facility];  Service: Cardiovascular;  Laterality: N/A;  . TOTAL KNEE ARTHROPLASTY  11/10/2011    Procedure: TOTAL KNEE ARTHROPLASTY;  Surgeon: Mauri Pole, MD;  [facility];  Service: Orthopedics;  Laterality: Right;  . TOTAL KNEE ARTHROPLASTY Left 05/24/2018   Procedure: LEFT TOTAL KNEE ARTHROPLASTY;  Surgeon: Melrose Nakayama, MD;  Location: [place];  Service: Orthopedics;  Laterality: Left;    Current Outpatient Medications  Medication Sig Dispense Refill  . acetaminophen (TYLENOL) 325 MG tablet Take 2 tablets (650 mg total) by mouth every 6 (six) hours as needed for mild pain (or Fever >/= 101). 30 tablet 1  . albuterol (VENTOLIN HFA) 108 (90 Base) MCG/ACT inhaler Inhale 1-2 puffs into the lungs every 6 (six) hours as needed for wheezing or shortness of breath. 1 Inhaler 0  . amLODipine (NORVASC) 10 MG tablet Take 10 mg by mouth daily.    Marland Kitchen apixaban (ELIQUIS) 2.5 MG TABS tablet Take 1 tablet (2.5 mg total) by mouth 2 (two) times daily. 60 tablet 6  . aspirin EC 81 MG tablet Take 81 mg by mouth daily. Swallow whole.    . fluticasone (FLONASE) 50 MCG/ACT nasal spray Place 2 sprays into both nostrils as needed for up to 14 days.    . furosemide (LASIX) 80 MG tablet Take 1 tablet (80 mg total) by mouth daily as needed for fluid or edema. 30 tablet 1  . insulin degludec (TRESIBA) 100 UNIT/ML FlexTouch Pen Inject 56 Units into the skin daily at 10 pm.    .  ipratropium-albuterol (DUONEB) 0.5-2.5 (3) MG/3ML SOLN Inhale 3 mLs into the lungs every 4 (four) hours as needed (shortness of breath).    . isosorbide mononitrate (IMDUR) 30 MG 24 hr tablet Take 1 tablet (30 mg total) by mouth daily. 30 tablet 1  . levocetirizine (XYZAL) 5 MG tablet Take 5 mg by mouth every evening.     Marland Kitchen levothyroxine (SYNTHROID) 100 MCG tablet Take 100 mcg by mouth daily. (takes with 264mcg for a total of 31mcg)    . levothyroxine (SYNTHROID, LEVOTHROID) 200 MCG tablet Take 300 mcg by mouth daily before breakfast. (takes with 167mcg for a total of 317mcg)    . nitroGLYCERIN (NITROSTAT) 0.4 MG SL tablet Place 0.4 mg  under the tongue every 5 (five) minutes as needed for chest pain.   11  . nystatin (MYCOSTATIN/NYSTOP) powder Apply topically 2 (two) times daily. 15 g 0  . OXYGEN Inhale 3 L into the lungs daily as needed.    . potassium chloride SA (KLOR-CON) 20 MEQ tablet Take 1 tablet (20 mEq total) by mouth 2 (two) times daily. 60 tablet 1  . senna-docusate (SENOKOT-S) 8.6-50 MG tablet Take 1 tablet by mouth daily as needed for mild constipation.    . simvastatin (ZOCOR) 5 MG tablet Take 5 mg by mouth daily.    . SYMBICORT 160-4.5 MCG/ACT inhaler Inhale 2 puffs into the lungs daily.     No current facility-administered medications for this visit.   Allergies:  Patient has no known allergies.   Social History: The patient  reports that he quit smoking about 12 months ago. His smoking use included cigarettes. He has a 7.50 pack-year smoking history. He has never used smokeless tobacco. He reports that he does not drink alcohol and does not use drugs.   Family History: The patient's family history includes Cancer in his father, maternal uncle, mother, and paternal uncle.   ROS:  Please see the history of present illness. Otherwise, complete review of systems is positive for {NONE DEFAULTED:18576::"none"}.  All other systems are reviewed and negative.   Physical Exam: VS:  There were no vitals taken for this visit., BMI There is no height or weight on file to calculate BMI.  Wt Readings from Last 3 Encounters:  10/11/20 291 lb 14.2 oz (132.4 kg)  09/26/20 (!) 308 lb 6.8 oz (139.9 kg)  09/10/20 (!) 307 lb (139.3 kg)    General: Patient appears comfortable at rest. HEENT: Conjunctiva and lids normal, oropharynx clear with moist mucosa. Neck: Supple, no elevated JVP or carotid bruits, no thyromegaly. Lungs: Clear to auscultation, nonlabored breathing at rest. Cardiac: Regular rate and rhythm, no S3 or significant systolic murmur, no pericardial rub. Abdomen: Soft, nontender, no hepatomegaly, bowel sounds  present, no guarding or rebound. Extremities: No pitting edema, distal pulses 2+. Skin: Warm and dry. Musculoskeletal: No kyphosis. Neuropsychiatric: Alert and oriented x3, affect grossly appropriate.  ECG:  {EKG/Telemetry Strips Reviewed:[phone]}  Recent Labwork: 10/06/2020: ALT 10; AST 10; B Natriuretic Peptide 170.0; TSH 1.681 10/10/2020: Hemoglobin 14.7; Platelets 111 10/11/2020: BUN 80; Creatinine, Ser 3.12; Magnesium 2.3; Potassium 3.2; Sodium 137     Component Value Date/Time   CHOL 95 06/25/2020 0607   TRIG 126 06/25/2020 0607   HDL 21 (L) 06/25/2020 0607   CHOLHDL 4.5 06/25/2020 0607   VLDL 25 06/25/2020 0607   LDLCALC 49 06/25/2020 0607    Other Studies Reviewed Today:  Echocardiogram 07/20/2020: 1. Left ventricular ejection fraction, by estimation, is 50 to 55%. The  left ventricle has low normal function. Left ventricular endocardial  border not optimally defined to evaluate regional wall motion. Left  ventricular diastolic parameters are  consistent with Grade II diastolic dysfunction (pseudonormalization).  Elevated left atrial pressure.  2. Right ventricular systolic function is normal. The right ventricular  size is normal.  3. Left atrial size was mildly dilated.  4. The mitral valve is normal in structure. Trivial mitral valve  regurgitation. No evidence of mitral stenosis.  5. The aortic valve is tricuspid. There is mild calcification of the  aortic valve. There is mild thickening of the aortic valve. Aortic valve  regurgitation is not visualized. No aortic stenosis is present.  6. The inferior vena cava is normal in size with greater than 50%  respiratory variability, suggesting right atrial pressure of 3 mmHg  Assessment and Plan:    Medication Adjustments/Labs and Tests Ordered: Current medicines are reviewed at length with the patient today.  Concerns regarding medicines are outlined above.   Tests Ordered: No orders of the defined types  were placed in this encounter.   Medication Changes: No orders of the defined types were placed in this encounter.   Disposition:  Follow up {follow up:15908}  Signed, Satira Sark, MD, [facility] 10/22/2020 10:26 AM    [facility] [place], [place], [place] [address] Phone: [phone]; Fax: [phone]

## 2020-10-23 ENCOUNTER — Ambulatory Visit: Payer: Medicare HMO | Admitting: Internal Medicine

## 2020-11-03 NOTE — Progress Notes (Deleted)
Cardiology Office Note  Date: 07/31/2020   ID: [name], [name] 1946/07/14, MRN [id number]  PCP:  [name]  Cardiologist:  Rozann Lesches, MD Electrophysiologist:  None   Chief Complaint: Hospital follow-up   History of Present Illness: [name] [name] is a 75 y.o. male with a history of CAD, HLD, bilateral leg pain, CKD stage III, hyperlipidemia, NSTEMI, OSA, DM type II, non-Hodgkin's lymphoma, hypothyroidism, morbid obesity.  Previous encounter with Dr. Domenic Polite 04/04/2020.  He had been complaining of some bilateral leg pain and was told he needed to be evaluated for possible PAD.  He denied any anginal symptoms, palpitations or syncope.  Prior echocardiogram in April 2021 showed EF of 45 to 50% with moderate diastolic dysfunction.  Mildly reduced RV contraction, mild biatrial enlargement, no major valvular abnormalities.  Blood pressure was elevated during that visit and he stated this was in a similar range at home.  Had a recent hospital admission on 08/04/2020 with presentation to the ED with worsening dyspnea, leg swelling, slight increase in cough.  Describes dyspnea as being constant, worsening moderate to severe.  He was deemed to be in acute diastolic heart failure volume overload.  Diuresed well on IV Lasix every 12.  Renal function was stable.  He was ready to go home with weight below 310 pounds.  He was discharged on 80 Lasix daily with calcium supplementation 20 mg p.o. twice daily.  He was on supplemental oxygen.  His persistent atrial fibrillation was rate controlled without agents.  His bisoprolol was held due to bradycardia.  He was continuing.  His stage IV CKD with stable creatinine.   He is here for 1 month follow-up.  States he feels 100% better since last visit.  At last visit he was recovering from Covid pneumonia.  States he feels good and has decent activity tolerance on continuous nasal O2.  He states without the nasal O2 he becomes  short of breath.  He denies any recent palpitations or arrhythmias, orthostatic symptoms, CVA or TIA-like symptoms, PND, orthopnea, bleeding, denies any claudication-like symptoms, DVT or PE-like symptoms, or lower extremity edema.  He is wearing low-grade compression stockings today.  Blood pressure is well controlled on current therapy blood pressure today 128/70.  Recent presentation for complaint of shortness of breath, orthopnea, lower extremity edema.  Admitted 10/06/2020 with acute on chronic diastolic heart failure exacerbation.  Received IV Lasix and metolazone for diuresis. AKI 2/2 diuresis.  Creatinine was elevated at 3.1.  Baseline on near 2.3-2.4.  He was instructed to avoid use of losartan and only take Lasix as needed.  He was advised to follow-up with PCP and recheck BMP in 1 week.  He was ordered to hold his bisoprolol indefinitely due to issues of bradycardia.  Order to hold losartan until BMP check in 1 week to ensure creatinine levels had returned to baseline.  Hold Losartan until BMP recheck. Hold Bisoprolol indefinitely, BMP 1 week.   Past Medical History:  Diagnosis Date  . CAD (coronary artery disease)    DES to LAD June 2018  . CKD (chronic kidney disease) stage 3, GFR 30-59 ml/min (HCC)   . COVID   . Essential hypertension   . History of pneumonia   . Hyperlipidemia   . Hypothyroidism   . LBBB (left bundle branch block)   . Morbid obesity ([place])   . Non Hodgkin's lymphoma ([place])    Status post XRT and chemotherapy  . NSTEMI (non-ST elevated  myocardial infarction) [facility])    June 2018  . Peripheral neuropathy   . Sleep apnea   . Type 2 diabetes mellitus ([place])     Past Surgical History:  Procedure Laterality Date  . CHOLECYSTECTOMY  1992  . COLONOSCOPY N/A 09/03/2014   SLF:six colon polyps removed/small internal hemorrhoids  . CORONARY STENT INTERVENTION N/A 01/21/2017   Procedure: Coronary Stent Intervention;  Surgeon: Nelva Bush, MD;  Location: [place] [facility];  Service: Cardiovascular;  Laterality: N/A;  . ESOPHAGOGASTRODUODENOSCOPY N/A 09/03/2014   SLF: mild gastritis/few gastric polyps  . LEFT HEART CATH AND CORONARY ANGIOGRAPHY N/A 01/20/2017   Procedure: Left Heart Cath and Coronary Angiography;  Surgeon: Jettie Booze, MD;  Location: [place] [facility];  Service: Cardiovascular;  Laterality: N/A;  . TOTAL KNEE ARTHROPLASTY  11/10/2011   Procedure: TOTAL KNEE ARTHROPLASTY;  Surgeon: Mauri Pole, MD;  [facility];  Service: Orthopedics;  Laterality: Right;  . TOTAL KNEE ARTHROPLASTY Left 05/24/2018   Procedure: LEFT TOTAL KNEE ARTHROPLASTY;  Surgeon: Melrose Nakayama, MD;  Location: [place];  Service: Orthopedics;  Laterality: Left;    Current Outpatient Medications  Medication Sig Dispense Refill  . acetaminophen (TYLENOL) 325 MG tablet Take 2 tablets (650 mg total) by mouth every 6 (six) hours as needed for mild pain (or Fever >/= 101). 30 tablet 1  . albuterol (VENTOLIN HFA) 108 (90 Base) MCG/ACT inhaler Inhale 1-2 puffs into the lungs every 6 (six) hours as needed for wheezing or shortness of breath. 1 Inhaler 0  . amLODipine (NORVASC) 10 MG tablet Take 10 mg by mouth daily.    Marland Kitchen apixaban (ELIQUIS) 2.5 MG TABS tablet Take 1 tablet (2.5 mg total) by mouth 2 (two) times daily. 60 tablet 6  . bisoprolol (ZEBETA) 5 MG tablet Take 2.5 mg by mouth daily.    . furosemide (LASIX) 40 MG tablet Take 40 mg by mouth daily.    . insulin aspart (NOVOLOG) 100 UNIT/ML FlexPen See admin instructions.    . insulin degludec (TRESIBA FLEXTOUCH) 100 UNIT/ML SOPN FlexTouch Pen Inject 0.5 mLs (50 Units total) into the skin daily at 10 pm. (Patient taking differently: Inject 60 Units into the skin daily at 10 pm.)    . ipratropium-albuterol (DUONEB) 0.5-2.5 (3) MG/3ML SOLN Inhale 3 mLs into the lungs every 4 (four) hours as needed.     . isosorbide mononitrate (IMDUR) 30 MG 24 hr tablet Take 1 tablet (30 mg total) by mouth daily. 30 tablet 1  .  levocetirizine (XYZAL) 5 MG tablet Take 5 mg by mouth every evening.     Marland Kitchen levothyroxine (SYNTHROID) 100 MCG tablet Take 100 mcg by mouth daily. (takes with 261mcg for a total of 379mcg)    . levothyroxine (SYNTHROID, LEVOTHROID) 200 MCG tablet Take 300 mcg by mouth daily before breakfast. (takes with 170mcg for a total of 373mcg)    . nitroGLYCERIN (NITROSTAT) 0.4 MG SL tablet Place 0.4 mg under the tongue every 5 (five) minutes as needed for chest pain.   11  . OXYGEN Inhale 2 L into the lungs daily as needed.     . senna-docusate (SENOKOT-S) 8.6-50 MG tablet Take 1 tablet by mouth daily as needed.     . simvastatin (ZOCOR) 5 MG tablet Take 5 mg by mouth daily.    . SYMBICORT 160-4.5 MCG/ACT inhaler Inhale 2 puffs into the lungs daily.    . fluticasone (FLONASE) 50 MCG/ACT nasal spray Place 2 sprays into both nostrils  daily for 14 days. (Patient taking differently: Place 2 sprays into both nostrils as needed. ) 1 g 0  . hydrALAZINE (APRESOLINE) 25 MG tablet Take 1 tablet (25 mg total) by mouth 2 (two) times daily. (Patient taking differently: Take 25 mg by mouth daily. ) 180 tablet 1   No current facility-administered medications for this visit.   Allergies:  Patient has no known allergies.   Social History: The patient  reports that he quit smoking about 13 years ago. His smoking use included cigarettes. He has a 7.50 pack-year smoking history. He has never used smokeless tobacco. He reports that he does not drink alcohol and does not use drugs.   Family History: The patient's family history includes Cancer in his father, maternal uncle, mother, and paternal uncle.   ROS:  Please see the history of present illness. Otherwise, complete review of systems is positive for none.  All other systems are reviewed and negative.   Physical Exam: VS:  BP 118/64   Pulse 73   Resp 16   SpO2 93% , BMI There is no height or weight on file to calculate BMI.  Wt Readings from Last 3 Encounters:   07/20/20 (!) 308 lb 1.6 oz (139.8 kg)  06/24/20 296 lb 8.3 oz (134.5 kg)  04/04/20 (!) 303 lb (137.4 kg)    General: Severely morbidly obese patient appears comfortable at rest. Neck: Supple, no elevated JVP or carotid bruits, no thyromegaly. Lungs: Clear to auscultation, nonlabored breathing at rest. Cardiac: Regular rate and rhythm, no S3 or significant systolic murmur, no pericardial rub. Extremities: No pitting edema, distal pulses 2+. Skin: Warm and dry. Musculoskeletal: No kyphosis. Neuropsychiatric: Alert and oriented x3, affect grossly appropriate.  ECG:  An ECG dated 07/31/2020 was personally reviewed today and demonstrated:  Atrial fibrillation with left axis deviation, left bundle branch block rate of 71  Recent Labwork: 06/27/2020: TSH 2.213 07/19/2020: B Natriuretic Peptide 182.0 07/20/2020: ALT 12; AST 15; BUN 34; Creatinine, Ser 2.50; Hemoglobin 15.8; Magnesium 2.3; Platelets 111; Potassium 3.3; Sodium 138     Component Value Date/Time   CHOL 95 06/25/2020 0607   TRIG 126 06/25/2020 0607   HDL 21 (L) 06/25/2020 0607   CHOLHDL 4.5 06/25/2020 0607   VLDL 25 06/25/2020 0607   LDLCALC 49 06/25/2020 0607    Other Studies Reviewed Today:  Chest x-ray 07/19/2020 IMPRESSION: Multifocal pneumonia with bilateral trace pleural effusions. Followup PA and lateral chest X-ray is recommended in 3-4 weeks following therapy to ensure resolution and exclude underlying malignancy.   Echocardiogram 07/20/2020 IMPRESSIONS 1. Left ventricular ejection fraction, by estimation, is 50 to 55%. The  left ventricle has low normal function. Left ventricular endocardial  border not optimally defined to evaluate regional wall motion. Left  ventricular diastolic parameters are  consistent with Grade II diastolic dysfunction (pseudonormalization).  Elevated left atrial pressure.  2. Right ventricular systolic function is normal. The right ventricular  size is normal.  3. Left  atrial size was mildly dilated.  4. The mitral valve is normal in structure. Trivial mitral valve  regurgitation. No evidence of mitral stenosis.  5. The aortic valve is tricuspid. There is mild calcification of the  aortic valve. There is mild thickening of the aortic valve. Aortic valve  regurgitation is not visualized. No aortic stenosis is present.  6. The inferior vena cava is normal in size with greater than 50%  respiratory variability, suggesting right atrial pressure of 3 mmHg.    Lower extremity arterial  duplex / ABI 04/17/2020 Right: Resting right ankle-brachial index is within normal range. No evidence of significant right lower extremity arterial disease. The right toe-brachial index is normal. Left: Resting left ankle-brachial index is within normal range. No evidence of significant left lower extremity arterial disease. The left toe-brachial index is normal.   Echocardiogram 11/17/2019: 1. Left ventricular ejection fraction, by estimation, is 45 to 50%. The  left ventricle has mildly decreased function. The left ventricle  demonstrates global hypokinesis. The left ventricular internal cavity size  was severely dilated. There is moderate  left ventricular hypertrophy of the septal segment. Left ventricular  diastolic parameters are consistent with Grade II diastolic dysfunction  (pseudonormalization). Elevated left ventricular end-diastolic pressure.  2. Right ventricular systolic function is mildly reduced. The right  ventricular size is mildly enlarged. There is normal pulmonary artery  systolic pressure.  3. Left atrial size was mildly dilated.  4. Right atrial size was mildly dilated.  5. The mitral valve is grossly normal. No evidence of mitral valve  regurgitation.  6. The aortic valve was not well visualized. Aortic valve regurgitation  is trivial.  7. The inferior vena cava is normal in size with greater than 50%  respiratory variability, suggesting  right atrial pressure of 3 mmHg  Assessment and Plan:   1. Atrial fibrillation, new onset ([place]) Heart rate today is 78 and irregularly irregular.  No recent bleeding on Eliquis continue Eliquis 2.5 mg p.o. twice daily.  We will continue to hold bisoprolol for now due to controlled heart rate.. CHADS2-Vasc 4  2. Coronary artery disease involving native coronary artery of native heart without angina pectoris Denies any anginal symptoms today.  Continue Imdur 30 mg daily.  Aspirin was stopped and Eliquis started at last visit for systemic anticoagulation for new onset atrial fibrillation.  Continue nitroglycerin sublingual as needed.  Continue simvastatin 5 mg daily.  3. Essential hypertension Blood pressure well controlled on current therapy continue amlodipine 10 mg daily, bisoprolol 2.5 mg daily.  Continue hydralazine 25 mg p.o. twice daily.   5. Stage 3b chronic kidney disease [facility]) Patient states he sees a nephrologist in [facility].  Advised him at last visit to schedule a follow-up appointment with nephrology.      6.  Pleural effusion/status post Covid pneumonia We ordered a repeat chest x-ray in 2 weeks around January 14 at last visit for recheck of pleural effusions and status post Covid pneumonia.  States he never got the chest x-ray.  However he states his breathing has significantly improved since then.  Lungs are clear throughout to auscultation.  Recent chest x-ray 08/04/2020 during recent hospital stay for CHF showed stable hazy opacitiesthroughout both lungs concerning for atypical pneumonia.   7.  Acute on chronic congestive heart failure. Denies any significant DOE but is not very active.  He is on continuous O2.  States he is okay as long as he is on this O2.  Recent weight gain from September where he weighed 303.  Today weight is 307.  He denies any PND or orthopnea.  No lower extremity edema noted.  He is wearing compression stockings.  Recent echocardiogram on 07/20/2020  demonstrated EF of 50 to 55%.  G2 DD, LA mildly dilated trivial MR. Continue furosemide 80 mg daily.  We will continue to hold bisoprolol for now due to significant bradycardia during recent hospital admission.  Heart rate today is 78.   Medication Adjustments/Labs and Tests Ordered: Current medicines are reviewed at length with  the patient today.  Concerns regarding medicines are outlined above.   Disposition: Follow-up with Dr. Domenic Polite or APP 6 months  Signed, Levell July, NP  07/31/2020 12:53 PM    [facility] at [place], [place], [place] [address] Phone: [phone]; Fax: [phone]

## 2020-11-04 ENCOUNTER — Ambulatory Visit: Payer: Medicare HMO | Admitting: Family Medicine

## 2020-11-04 DIAGNOSIS — I5032 Chronic diastolic (congestive) heart failure: Secondary | ICD-10-CM

## 2020-11-04 DIAGNOSIS — N183 Chronic kidney disease, stage 3 unspecified: Secondary | ICD-10-CM

## 2020-11-04 DIAGNOSIS — I4891 Unspecified atrial fibrillation: Secondary | ICD-10-CM

## 2020-11-05 ENCOUNTER — Telehealth: Payer: Self-pay | Admitting: Adult Health

## 2020-11-05 NOTE — Telephone Encounter (Signed)
Second and final attempt call to patient to discuss [place] for COVID 19 prevention.  LMOM for him to call me back to discuss further.  My chart message sent as well.    Wilber Bihari, NP

## 2020-11-10 NOTE — Progress Notes (Signed)
Cardiology Office Note  Date: 07/31/2020   ID: [name], [name] 05-Oct-1945, MRN [id number]  PCP:  [name]  Cardiologist:  Rozann Lesches, MD Electrophysiologist:  None   Chief Complaint: Hospital follow-up   History of Present Illness: [name] [name] is a 75 y.o. male with a history of CAD, HLD, bilateral leg pain, CKD stage III, hyperlipidemia, NSTEMI, OSA, DM type II, non-Hodgkin's lymphoma, hypothyroidism, morbid obesity.   Recent presentation 10/06/2020 for complaint of shortness of breath, orthopnea, lower extremity edema.  Admitted 10/06/2020 with acute on chronic diastolic heart failure exacerbation.  Received IV Lasix and metolazone for diuresis. AKI 2/2 diuresis. Creatinine was elevated at 3.1.  Baseline on near 2.3-2.4.  He was instructed to avoid use of losartan and only take Lasix as needed.  He was advised to follow-up with PCP and recheck BMP in 1 week.  He was ordered to hold his bisoprolol indefinitely due to issues of bradycardia.  Ordered to hold losartan until BMP check in 1 week to ensure creatinine levels had returned to baseline.  He is here for hospital follow-up.  He sitting in a wheelchair with continuous O2 today.  States he feels reasonably well but gets short of breath when doing minimal tasks.  He has a history of COPD.  States he seen Dr. [name] neurology once.  Stated he had a follow-up but was unable to make it.  His renal function has decreased.  Recent creatinine was 3.12 with GFR of 20.  His losartan was stopped at recent hospitalization for decrease in renal function.  He was to have a follow-up BMP  and magnesium in 1 week to reassess.  He has not had follow-up lab work.  Blood pressure today is 140/86.  His bisoprolol was stopped in hospital due to significant bradycardia.  States he has a nephrologist in [facility] who he refers to as Dr. Obie Dredge.  He has not seen him in quite some time..  Today he denies any anginal or exertional  symptoms, palpitations or arrhythmias, orthostatic symptoms, CVA or TIA-like symptoms, PND, orthopnea, bleeding.  Denies any claudication-like symptoms, DVT or PE-like symptoms.  Has some mild lower extremity edema.  Past Medical History:  Diagnosis Date  . CAD (coronary artery disease)    DES to LAD June 2018  . CKD (chronic kidney disease) stage 3, GFR 30-59 ml/min (HCC)   . COVID   . Essential hypertension   . History of pneumonia   . Hyperlipidemia   . Hypothyroidism   . LBBB (left bundle branch block)   . Morbid obesity ([place])   . Non Hodgkin's lymphoma ([place])    Status post XRT and chemotherapy  . NSTEMI (non-ST elevated myocardial infarction) [facility])    June 2018  . Peripheral neuropathy   . Sleep apnea   . Type 2 diabetes mellitus ([place])     Past Surgical History:  Procedure Laterality Date  . CHOLECYSTECTOMY  1992  . COLONOSCOPY N/A 09/03/2014   SLF:six colon polyps removed/small internal hemorrhoids  . CORONARY STENT INTERVENTION N/A 01/21/2017   Procedure: Coronary Stent Intervention;  Surgeon: Nelva Bush, MD;  Location: [facility];  Service: Cardiovascular;  Laterality: N/A;  . ESOPHAGOGASTRODUODENOSCOPY N/A 09/03/2014   SLF: mild gastritis/few gastric polyps  . LEFT HEART CATH AND CORONARY ANGIOGRAPHY N/A 01/20/2017   Procedure: Left Heart Cath and Coronary Angiography;  Surgeon: Jettie Booze, MD;  Location: [facility];  Service: Cardiovascular;  Laterality: N/A;  . TOTAL KNEE ARTHROPLASTY  11/10/2011   Procedure: TOTAL KNEE ARTHROPLASTY;  Surgeon: Mauri Pole, MD;  Location: [facility];  Service: Orthopedics;  Laterality: Right;  . TOTAL KNEE ARTHROPLASTY Left 05/24/2018   Procedure: LEFT TOTAL KNEE ARTHROPLASTY;  Surgeon: Melrose Nakayama, MD;  Location: [facility];  Service: Orthopedics;  Laterality: Left;    Current Outpatient Medications  Medication Sig Dispense Refill  . acetaminophen (TYLENOL) 325 MG tablet Take 2 tablets (650 mg total) by mouth  every 6 (six) hours as needed for mild pain (or Fever >/= 101). 30 tablet 1  . albuterol (VENTOLIN HFA) 108 (90 Base) MCG/ACT inhaler Inhale 1-2 puffs into the lungs every 6 (six) hours as needed for wheezing or shortness of breath. 1 Inhaler 0  . amLODipine (NORVASC) 10 MG tablet Take 10 mg by mouth daily.    Marland Kitchen apixaban (ELIQUIS) 2.5 MG TABS tablet Take 1 tablet (2.5 mg total) by mouth 2 (two) times daily. 60 tablet 6  . bisoprolol (ZEBETA) 5 MG tablet Take 2.5 mg by mouth daily.    . furosemide (LASIX) 40 MG tablet Take 40 mg by mouth daily.    . insulin aspart (NOVOLOG) 100 UNIT/ML FlexPen See admin instructions.    . insulin degludec (TRESIBA FLEXTOUCH) 100 UNIT/ML SOPN FlexTouch Pen Inject 0.5 mLs (50 Units total) into the skin daily at 10 pm. (Patient taking differently: Inject 60 Units into the skin daily at 10 pm.)    . ipratropium-albuterol (DUONEB) 0.5-2.5 (3) MG/3ML SOLN Inhale 3 mLs into the lungs every 4 (four) hours as needed.     . isosorbide mononitrate (IMDUR) 30 MG 24 hr tablet Take 1 tablet (30 mg total) by mouth daily. 30 tablet 1  . levocetirizine (XYZAL) 5 MG tablet Take 5 mg by mouth every evening.     Marland Kitchen levothyroxine (SYNTHROID) 100 MCG tablet Take 100 mcg by mouth daily. (takes with 247mcg for a total of 315mcg)    . levothyroxine (SYNTHROID, LEVOTHROID) 200 MCG tablet Take 300 mcg by mouth daily before breakfast. (takes with 125mcg for a total of 352mcg)    . nitroGLYCERIN (NITROSTAT) 0.4 MG SL tablet Place 0.4 mg under the tongue every 5 (five) minutes as needed for chest pain.   11  . OXYGEN Inhale 2 L into the lungs daily as needed.     . senna-docusate (SENOKOT-S) 8.6-50 MG tablet Take 1 tablet by mouth daily as needed.     . simvastatin (ZOCOR) 5 MG tablet Take 5 mg by mouth daily.    . SYMBICORT 160-4.5 MCG/ACT inhaler Inhale 2 puffs into the lungs daily.    . fluticasone (FLONASE) 50 MCG/ACT nasal spray Place 2 sprays into both nostrils daily for 14 days. (Patient  taking differently: Place 2 sprays into both nostrils as needed. ) 1 g 0  . hydrALAZINE (APRESOLINE) 25 MG tablet Take 1 tablet (25 mg total) by mouth 2 (two) times daily. (Patient taking differently: Take 25 mg by mouth daily. ) 180 tablet 1   No current facility-administered medications for this visit.   Allergies:  Patient has no known allergies.   Social History: The patient  reports that he quit smoking about 13 years ago. His smoking use included cigarettes. He has a 7.50 pack-year smoking history. He has never used smokeless tobacco. He reports that he does not drink alcohol and does not use drugs.   Family History: The patient's family history includes Cancer in his father, maternal uncle,  mother, and paternal uncle.   ROS:  Please see the history of present illness. Otherwise, complete review of systems is positive for none.  All other systems are reviewed and negative.   Physical Exam: VS:  BP 118/64   Pulse 73   Resp 16   SpO2 93% , BMI There is no height or weight on file to calculate BMI.  Wt Readings from Last 3 Encounters:  07/20/20 (!) 308 lb 1.6 oz (139.8 kg)  06/24/20 296 lb 8.3 oz (134.5 kg)  04/04/20 (!) 303 lb (137.4 kg)    General: Severely morbidly obese patient appears comfortable at rest. Neck: Supple, no elevated JVP or carotid bruits, no thyromegaly. Lungs: Clear to auscultation, nonlabored breathing at rest. Cardiac: Irregularly irregular rate and rhythm, no S3 or significant systolic murmur, no pericardial rub. Extremities: No pitting edema, distal pulses 2+. Skin: Warm and dry. Musculoskeletal: No kyphosis. Neuropsychiatric: Alert and oriented x3, affect grossly appropriate.  ECG:  EKG 10/06/2020 atrial fibrillation with a rate of 78, nonspecific IVCD with LAD, anterolateral infarct old.  Recent Labwork: 06/27/2020: TSH 2.213 07/19/2020: B Natriuretic Peptide 182.0 07/20/2020: ALT 12; AST 15; BUN 34; Creatinine, Ser 2.50; Hemoglobin 15.8; Magnesium  2.3; Platelets 111; Potassium 3.3; Sodium 138     Component Value Date/Time   CHOL 95 06/25/2020 0607   TRIG 126 06/25/2020 0607   HDL 21 (L) 06/25/2020 0607   CHOLHDL 4.5 06/25/2020 0607   VLDL 25 06/25/2020 0607   LDLCALC 49 06/25/2020 0607    Other Studies Reviewed Today:  Chest x-ray 07/19/2020 IMPRESSION: Multifocal pneumonia with bilateral trace pleural effusions. Followup PA and lateral chest X-ray is recommended in 3-4 weeks following therapy to ensure resolution and exclude underlying malignancy.   Echocardiogram 07/20/2020 IMPRESSIONS 1. Left ventricular ejection fraction, by estimation, is 50 to 55%. The  left ventricle has low normal function. Left ventricular endocardial  border not optimally defined to evaluate regional wall motion. Left  ventricular diastolic parameters are  consistent with Grade II diastolic dysfunction (pseudonormalization).  Elevated left atrial pressure.  2. Right ventricular systolic function is normal. The right ventricular  size is normal.  3. Left atrial size was mildly dilated.  4. The mitral valve is normal in structure. Trivial mitral valve  regurgitation. No evidence of mitral stenosis.  5. The aortic valve is tricuspid. There is mild calcification of the  aortic valve. There is mild thickening of the aortic valve. Aortic valve  regurgitation is not visualized. No aortic stenosis is present.  6. The inferior vena cava is normal in size with greater than 50%  respiratory variability, suggesting right atrial pressure of 3 mmHg.    Lower extremity arterial duplex / ABI 04/17/2020 Right: Resting right ankle-brachial index is within normal range. No evidence of significant right lower extremity arterial disease. The right toe-brachial index is normal. Left: Resting left ankle-brachial index is within normal range. No evidence of significant left lower extremity arterial disease. The left toe-brachial index is  normal.   Echocardiogram 11/17/2019: 1. Left ventricular ejection fraction, by estimation, is 45 to 50%. The  left ventricle has mildly decreased function. The left ventricle  demonstrates global hypokinesis. The left ventricular internal cavity size  was severely dilated. There is moderate  left ventricular hypertrophy of the septal segment. Left ventricular  diastolic parameters are consistent with Grade II diastolic dysfunction  (pseudonormalization). Elevated left ventricular end-diastolic pressure.  2. Right ventricular systolic function is mildly reduced. The right  ventricular size is mildly  enlarged. There is normal pulmonary artery  systolic pressure.  3. Left atrial size was mildly dilated.  4. Right atrial size was mildly dilated.  5. The mitral valve is grossly normal. No evidence of mitral valve  regurgitation.  6. The aortic valve was not well visualized. Aortic valve regurgitation  is trivial.  7. The inferior vena cava is normal in size with greater than 50%  respiratory variability, suggesting right atrial pressure of 3 mmHg  Assessment and Plan:   1. Atrial fibrillation, new onset ([place]) Heart rate today is 57 and irregularly irregular.  No recent bleeding on Eliquis.  Continue Eliquis 2.5 mg p.o. twice daily.  We will continue to hold bisoprolol.  Bisoprolol was stopped during recent hospital stay for significant bradycardia.  Plan was to hold indefinitely. CHADS2-Vasc 4  2. Coronary artery disease involving native coronary artery of native heart without angina pectoris Denies any anginal symptoms today.  Continue Imdur 30 mg daily.  Aspirin was stopped and Eliquis started at last visit for systemic anticoagulation for new onset atrial fibrillation.  Continue nitroglycerin sublingual as needed.  Continue simvastatin 5 mg daily.  3. Essential hypertension BP elevated today at 140/86.  Bisoprolol was recently stopped at hospital secondary to bradycardia.   Losartan was stopped due to  decreased renal function.  Continue amlodipine 10 mg daily,  Continue hydralazine 25 mg p.o. twice daily.  Please get a repeat basic metabolic panel and magnesium   5. Stage 3b chronic kidney disease [facility]) Patient states he sees a nephrologist in [facility].  Advised him at last visit to schedule a follow-up appointment with nephrology.  Recent creatinine 3.12 and GFR 20 during hospital stay.  He did not follow-up after last visit when we recommended he see his nephrologist.  We will attempt to re-refer to his nephrologist in [facility].  Get a repeat basic metabolic panel and magnesium.   6.  Pleural effusion/status post Covid pneumonia We ordered a repeat chest x-ray in 2 weeks around January 14 at last visit for recheck of pleural effusions and status post Covid pneumonia.  States he never got the chest x-ray.  However he states his breathing has significantly improved since then.  Lungs are clear throughout to auscultation.  Recent chest x-ray 08/04/2020 during recent hospital stay for CHF showed stable hazy opacitiesthroughout both lungs concerning for atypical pneumonia.   7.  Acute on chronic congestive heart failure. Denies any significant DOE but is not very active.  He is on continuous O2.  States he is okay as long as he is on this O2.  Weight today is 307.  He states he has been taking his Lasix as needed for swelling.  He denies any PND or orthopnea.  Has some mild lower extremity edema.  Recent echocardiogram on 07/20/2020 demonstrated EF of 50 to 55%.  G2 DD, LA mildly dilated trivial MR. Continue furosemide 80 mg daily.  Bisoprolol was held indefinitely due to significant bradycardia during recent hospitalization.Marland Kitchen  Heart rate today 57 he states he seems to be more short of breath recently.  Please get a repeat echocardiogram 1 week prior to follow-up.   8.  COPD./Shortness of breath. Patient states he can barely walk across the floor without giving out of  breath.  States he has a history of COPD.  He has seen Dr. [name] in the past but had not followed up.  States he had a scheduled appointment but missed it.  Please refer back to Dr.  Wert for evaluation.  Medication Adjustments/Labs and Tests Ordered: Current medicines are reviewed at length with the patient today.  Concerns regarding medicines are outlined above.   Disposition: Follow-up with Dr. Domenic Polite at scheduled appointment in August  Signed, [name] Causby Quinn, NP  07/31/2020 12:53 PM    [facility] [place], [place], [place] [address] Phone: [phone]; Fax: [phone]

## 2020-11-11 ENCOUNTER — Encounter: Payer: Self-pay | Admitting: Family Medicine

## 2020-11-11 ENCOUNTER — Ambulatory Visit: Payer: Medicare HMO | Admitting: Family Medicine

## 2020-11-11 ENCOUNTER — Other Ambulatory Visit: Payer: Self-pay | Admitting: Family Medicine

## 2020-11-11 VITALS — BP 140/86 | HR 57 | Ht 72.0 in | Wt 307.0 lb

## 2020-11-11 DIAGNOSIS — I10 Essential (primary) hypertension: Secondary | ICD-10-CM | POA: Diagnosis not present

## 2020-11-11 DIAGNOSIS — I4891 Unspecified atrial fibrillation: Secondary | ICD-10-CM | POA: Diagnosis not present

## 2020-11-11 DIAGNOSIS — J449 Chronic obstructive pulmonary disease, unspecified: Secondary | ICD-10-CM

## 2020-11-11 DIAGNOSIS — I2510 Atherosclerotic heart disease of native coronary artery without angina pectoris: Secondary | ICD-10-CM | POA: Diagnosis not present

## 2020-11-11 DIAGNOSIS — I5032 Chronic diastolic (congestive) heart failure: Secondary | ICD-10-CM

## 2020-11-11 DIAGNOSIS — R0602 Shortness of breath: Secondary | ICD-10-CM

## 2020-11-11 DIAGNOSIS — N1832 Chronic kidney disease, stage 3b: Secondary | ICD-10-CM

## 2020-11-11 NOTE — Patient Instructions (Addendum)
Medication Instructions:  Continue all current medications.  Labwork:  BMET, Mg - orders given today.   Office will contact with results via phone or letter.    Testing/Procedures: Your physician has requested that you have an echocardiogram. Echocardiography is a painless test that uses sound waves to create images of your heart. It provides your doctor with information about the size and shape of your heart and how well your heart's chambers and valves are working. This procedure takes approximately one hour. There are no restrictions for this procedure - DUE JUST PRIOR TO NEXT VISIT  Follow-Up: 3 months   Any Other Special Instructions Will Be Listed Below (If Applicable).  You have been referred to:  Nephrology  You have been referred to:  Pulmonology   If you need a refill on your cardiac medications before your next appointment, please call your pharmacy.

## 2020-11-15 ENCOUNTER — Ambulatory Visit: Payer: Medicare HMO | Admitting: Family Medicine

## 2020-11-19 ENCOUNTER — Emergency Department (HOSPITAL_COMMUNITY)
Admission: EM | Admit: 2020-11-19 | Discharge: 2020-11-19 | Disposition: A | Discharge status: Home / Self Care | Payer: Medicare HMO | Attending: Emergency Medicine | Admitting: Emergency Medicine

## 2020-11-19 ENCOUNTER — Other Ambulatory Visit: Payer: Self-pay

## 2020-11-19 ENCOUNTER — Encounter (HOSPITAL_COMMUNITY): Payer: Self-pay | Admitting: Emergency Medicine

## 2020-11-19 ENCOUNTER — Emergency Department (HOSPITAL_COMMUNITY): Payer: Medicare HMO

## 2020-11-19 DIAGNOSIS — Z96653 Presence of artificial knee joint, bilateral: Secondary | ICD-10-CM | POA: Insufficient documentation

## 2020-11-19 DIAGNOSIS — Z87891 Personal history of nicotine dependence: Secondary | ICD-10-CM | POA: Insufficient documentation

## 2020-11-19 DIAGNOSIS — Z8616 Personal history of COVID-19: Secondary | ICD-10-CM | POA: Insufficient documentation

## 2020-11-19 DIAGNOSIS — Z79899 Other long term (current) drug therapy: Secondary | ICD-10-CM | POA: Insufficient documentation

## 2020-11-19 DIAGNOSIS — I130 Hypertensive heart and chronic kidney disease with heart failure and stage 1 through stage 4 chronic kidney disease, or unspecified chronic kidney disease: Secondary | ICD-10-CM | POA: Diagnosis not present

## 2020-11-19 DIAGNOSIS — Z7982 Long term (current) use of aspirin: Secondary | ICD-10-CM | POA: Insufficient documentation

## 2020-11-19 DIAGNOSIS — N184 Chronic kidney disease, stage 4 (severe): Secondary | ICD-10-CM | POA: Diagnosis not present

## 2020-11-19 DIAGNOSIS — Z794 Long term (current) use of insulin: Secondary | ICD-10-CM | POA: Diagnosis not present

## 2020-11-19 DIAGNOSIS — I2510 Atherosclerotic heart disease of native coronary artery without angina pectoris: Secondary | ICD-10-CM | POA: Insufficient documentation

## 2020-11-19 DIAGNOSIS — E1140 Type 2 diabetes mellitus with diabetic neuropathy, unspecified: Secondary | ICD-10-CM | POA: Insufficient documentation

## 2020-11-19 DIAGNOSIS — E039 Hypothyroidism, unspecified: Secondary | ICD-10-CM | POA: Insufficient documentation

## 2020-11-19 DIAGNOSIS — J449 Chronic obstructive pulmonary disease, unspecified: Secondary | ICD-10-CM | POA: Insufficient documentation

## 2020-11-19 DIAGNOSIS — R2242 Localized swelling, mass and lump, left lower limb: Secondary | ICD-10-CM | POA: Diagnosis not present

## 2020-11-19 DIAGNOSIS — Z7901 Long term (current) use of anticoagulants: Secondary | ICD-10-CM | POA: Insufficient documentation

## 2020-11-19 DIAGNOSIS — I509 Heart failure, unspecified: Secondary | ICD-10-CM

## 2020-11-19 DIAGNOSIS — I5043 Acute on chronic combined systolic (congestive) and diastolic (congestive) heart failure: Secondary | ICD-10-CM | POA: Insufficient documentation

## 2020-11-19 DIAGNOSIS — R0602 Shortness of breath: Secondary | ICD-10-CM | POA: Diagnosis present

## 2020-11-19 DIAGNOSIS — E1122 Type 2 diabetes mellitus with diabetic chronic kidney disease: Secondary | ICD-10-CM | POA: Insufficient documentation

## 2020-11-19 DIAGNOSIS — I480 Paroxysmal atrial fibrillation: Secondary | ICD-10-CM | POA: Diagnosis not present

## 2020-11-19 LAB — CBC WITH DIFFERENTIAL/PLATELET
Abs Immature Granulocytes: 0.02 10*3/uL (ref 0.00–0.07)
Basophils Absolute: 0.1 10*3/uL (ref 0.0–0.1)
Basophils Relative: 1 %
Eosinophils Absolute: 0.2 10*3/uL (ref 0.0–0.5)
Eosinophils Relative: 3 %
HCT: 52.6 % — ABNORMAL HIGH (ref 39.0–52.0)
Hemoglobin: 16.6 g/dL (ref 13.0–17.0)
Immature Granulocytes: 0 %
Lymphocytes Relative: 35 %
Lymphs Abs: 2 10*3/uL (ref 0.7–4.0)
MCH: 28.4 pg (ref 26.0–34.0)
MCHC: 31.6 g/dL (ref 30.0–36.0)
MCV: 90.1 fL (ref 80.0–100.0)
Monocytes Absolute: 0.6 10*3/uL (ref 0.1–1.0)
Monocytes Relative: 10 %
Neutro Abs: 2.9 10*3/uL (ref 1.7–7.7)
Neutrophils Relative %: 51 %
Platelets: 135 10*3/uL — ABNORMAL LOW (ref 150–400)
RBC: 5.84 MIL/uL — ABNORMAL HIGH (ref 4.22–5.81)
RDW: 16.7 % — ABNORMAL HIGH (ref 11.5–15.5)
WBC: 5.8 10*3/uL (ref 4.0–10.5)
nRBC: 0 % (ref 0.0–0.2)

## 2020-11-19 LAB — URINALYSIS, ROUTINE W REFLEX MICROSCOPIC
Bacteria, UA: NONE SEEN
Bilirubin Urine: NEGATIVE
Glucose, UA: NEGATIVE mg/dL
Hgb urine dipstick: NEGATIVE
Ketones, ur: NEGATIVE mg/dL
Leukocytes,Ua: NEGATIVE
Nitrite: NEGATIVE
Protein, ur: >=300 mg/dL — AB
Specific Gravity, Urine: 1.015 (ref 1.005–1.030)
pH: 6 (ref 5.0–8.0)

## 2020-11-19 LAB — COMPREHENSIVE METABOLIC PANEL
ALT: 9 U/L (ref 0–44)
AST: 15 U/L (ref 15–41)
Albumin: 4.2 g/dL (ref 3.5–5.0)
Alkaline Phosphatase: 91 U/L (ref 38–126)
Anion gap: 9 (ref 5–15)
BUN: 21 mg/dL (ref 8–23)
CO2: 26 mmol/L (ref 22–32)
Calcium: 9.2 mg/dL (ref 8.9–10.3)
Chloride: 104 mmol/L (ref 98–111)
Creatinine, Ser: 2.25 mg/dL — ABNORMAL HIGH (ref 0.61–1.24)
GFR, Estimated: 30 mL/min — ABNORMAL LOW (ref >60)
Glucose, Bld: 91 mg/dL (ref 70–99)
Potassium: 3.8 mmol/L (ref 3.5–5.1)
Sodium: 139 mmol/L (ref 135–145)
Total Bilirubin: 1.4 mg/dL — ABNORMAL HIGH (ref 0.3–1.2)
Total Protein: 7.9 g/dL (ref 6.5–8.1)

## 2020-11-19 LAB — TROPONIN I (HIGH SENSITIVITY)
Troponin I (High Sensitivity): 33 ng/L — ABNORMAL HIGH (ref <18)
Troponin I (High Sensitivity): 31 ng/L — ABNORMAL HIGH (ref <18)

## 2020-11-19 LAB — BRAIN NATRIURETIC PEPTIDE: B Natriuretic Peptide: 187 pg/mL — ABNORMAL HIGH (ref 0.0–100.0)

## 2020-11-19 MED ORDER — FUROSEMIDE 10 MG/ML IJ SOLN
80.0000 mg | Freq: Once | INTRAMUSCULAR | Status: AC
  Filled 2020-11-19: qty 8

## 2020-11-19 MED ORDER — FUROSEMIDE 10 MG/ML IJ SOLN
INTRAMUSCULAR | Status: AC
  Administered 2020-11-19: 80 mg via INTRAVENOUS
  Filled 2020-11-19: qty 4

## 2020-11-19 NOTE — ED Provider Notes (Signed)
Patient is a 75 year old male, known history of congestive heart failure, his last echocardiogram showed an ejection fraction of 50 to 55%, there was grade 2 diastolic dysfunction.  He presents with increasing shortness of breath especially on exertion.  He is gone from 3 L to 4 L by nasal cannula but on exam his lungs that are overall clear, he speaks in full sentences and has no significant peripheral edema.  His EKG is unchanged from before, he does have some PVCs but a wide-complex low voltage QRS complex at baseline.  Labs show mild troponin at 33, this will need to be repeated, his BNP is reasonable, his x-ray shows some interstitial edema, he will be given some Lasix.  He does not appear to have any pneumonia and has no fevers or chills.  Medical screening examination/treatment/procedure(s) were conducted as a shared visit with non-physician practitioner(s) and myself.  I personally evaluated the patient during the encounter.  Clinical Impression:   Final diagnoses:  None         Noemi Chapel, MD 11/22/20 [phone]

## 2020-11-19 NOTE — ED Provider Notes (Signed)
[facility] Provider Note   CSN: [id number] Arrival date & time: 11/19/20  1334     History Chief Complaint  Patient presents with  . Shortness of Breath    [name] [name] is a 75 y.o. male who presents to the ED today via EMS complains of intermittent shortness of breath since being discharged from the hospital on 3/11.  Patient reports that the most recent bout started last night.  He states that he has been using his inhalers without much relief prompting him to call EMS today.  He was provided with Solu-Medrol and DuoNeb via EMS and states that he feels improved.  He is chronically on 3 L O2 and presenting on 4 L satting 90%.  Does report that he was recently taken off of his Lasix by his PCP however has taken it 3 times over the past 3 to 4 weeks, most recently about 3 days ago.  He does feel like he is having some leg swelling and his abdomen is slightly more tight than normal.  He does state that he weighs himself regularly however has not gained any weight.  He denies any fevers or chills.  He does mention he had 1 quick episode of a funny feeling in his chest yesterday that quickly dissipated.  Not having any active chest pain.  Denies any associated symptoms with the chest pain.  Per chart review patient was seen by his cardiologist on 04/11.  It does appear his losartan was held during his hospital stay due to elevated kidney function.  It appears he takes his Lasix as needed.   The history is provided by the patient, the EMS personnel and medical records.       Past Medical History:  Diagnosis Date  . Atrial fibrillation ([place])   . CAD (coronary artery disease)    DES to LAD June 2018  . CKD (chronic kidney disease) stage 3, GFR 30-59 ml/min (HCC)   . Essential hypertension   . History of pneumonia   . Hyperlipidemia   . Hypothyroidism   . LBBB (left bundle branch block)   . Morbid obesity ([place])   . Non Hodgkin's lymphoma ([place])    Status post XRT and  chemotherapy  . NSTEMI (non-ST elevated myocardial infarction) [facility])    June 2018  . Peripheral neuropathy   . Pneumonia due to COVID-19 virus   . Sleep apnea   . Type 2 diabetes mellitus [facility])     Patient Active Problem List   Diagnosis Date Noted  . Acute on chronic diastolic HF (heart failure) ([place]) 10/06/2020  . Chronic respiratory failure with hypoxia and hypercapnia ([place]) 09/10/2020  . Former smoker 08/10/2020  . COPD (chronic obstructive pulmonary disease) ([place]) 08/10/2020  . Acute on chronic diastolic CHF (congestive heart failure) ([place]) 08/05/2020  . Acute respiratory failure with hypoxia ([place]) 08/04/2020  . Acute exacerbation of CHF (congestive heart failure) ([place]) 07/19/2020  . AF (paroxysmal atrial fibrillation) ([place]) 07/19/2020  . COVID-19   . Acute on chronic respiratory failure with hypoxia ([place]) 06/25/2020  . Aspiration pneumonia ([place]) 06/24/2020  . Pneumonia due to COVID-19 virus 06/24/2020  . Confusion   . Small bowel obstruction ([place]) 01/29/2020  . SBO (small bowel obstruction) ([place]) 01/28/2020  . Primary osteoarthritis of left knee 05/24/2018  . Primary localized osteoarthritis of left knee 05/19/2018  . Acute on chronic systolic and diastolic heart failure, NYHA class 1 ([place]) 04/01/2017  . CAD (coronary artery disease) 04/01/2017  .  DOE (dyspnea on exertion) 03/21/2017  . HTN (hypertension) 03/21/2017  . NSTEMI (non-ST elevated myocardial infarction) ([place]) 01/19/2017  . NSTEMI, initial episode of care ([place]) 01/19/2017  . Sepsis ([place]) 07/24/2016  . Elevated troponin I level 07/24/2016  . Fever 07/24/2016  . Lactic acidosis 07/24/2016  . Adjustment insomnia 05/18/2016  . Erectile dysfunction 12/19/2015  . Sleep apnea 09/30/2015  . Osteoarthritis 09/30/2015  . Hypothyroidism 09/30/2015  . Hypogonadism in male 09/30/2015  . Diabetic neuropathy ([place]) 09/30/2015  . Chronic pain of left knee 09/30/2015  . Benign essential hypertension 09/30/2015  . Acute  on chronic kidney failure ([place]) 05/14/2015  . Diarrhea 05/14/2015  . Dehydration 05/14/2015  . Hypokalemia 05/14/2015  . 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 Pelvic mass in male   . Varices, esophageal ([place])   . Colonic mass   . Abnormal CT scan, pelvis   . Bladder mass   . Elevated liver enzymes   . Elevated LFTs   . Abdominal pain 07/23/2014  . Chronic kidney disease Stage IV 07/23/2014  . Morbid obesity ([place]) 07/23/2014  . Type 2 diabetes mellitus with stage 4 chronic kidney disease ([place]) 07/23/2014  . Hypertension 07/23/2014  . S/P total knee replacement, 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    Past Surgical History:  Procedure Laterality Date  . CHOLECYSTECTOMY  1992  . COLONOSCOPY N/A 09/03/2014   SLF:six colon polyps removed/small internal hemorrhoids  . CORONARY STENT INTERVENTION N/A 01/21/2017   Procedure: Coronary Stent Intervention;  Surgeon: Nelva Bush, MD;  Location: [facility];  Service: Cardiovascular;  Laterality: N/A;  . ESOPHAGOGASTRODUODENOSCOPY N/A 09/03/2014   SLF: mild gastritis/few gastric polyps  . LEFT HEART CATH AND CORONARY ANGIOGRAPHY N/A 01/20/2017   Procedure: Left Heart Cath and Coronary Angiography;  Surgeon: Jettie Booze, MD;  Location: [facility];  Service: Cardiovascular;  Laterality: N/A;  . TOTAL KNEE ARTHROPLASTY  11/10/2011   Procedure: TOTAL KNEE ARTHROPLASTY;  Surgeon: Mauri Pole, MD;  Location: [facility];  Service: Orthopedics;  Laterality: Right;  . TOTAL KNEE ARTHROPLASTY Left 05/24/2018   Procedure: LEFT TOTAL KNEE ARTHROPLASTY;  Surgeon: Melrose Nakayama, MD;  Location: [facility];  Service: Orthopedics;  Laterality: Left;       Family History  Problem Relation Age of Onset  . Cancer Mother        breast and lung  . Cancer Father        bladder  . Cancer Maternal Uncle        prostate  . Cancer Paternal Uncle        esophagus  . Colon cancer Neg Hx     Social History   Tobacco Use  . Smoking status: Former Smoker     Packs/day: 0.25    Years: 30.00    Pack years: 7.50    Types: Cigarettes    Quit date: 10/2019    Years since quitting: 1.1  . Smokeless tobacco: Never Used  Vaping Use  . Vaping Use: Never used  Substance Use Topics  . Alcohol use: No    Alcohol/week: 0.0 standard drinks  . Drug use: No    Home Medications Prior to Admission medications   Medication Sig Start Date End Date Taking? Authorizing Provider  acetaminophen (TYLENOL) 325 MG tablet Take 2 tablets (650 mg total) by mouth every 6 (six) hours as needed for mild pain (or Fever >/= 101). 02/01/20  Yes Roxan Hockey, MD  albuterol (VENTOLIN HFA) 108 (90 Base) MCG/ACT inhaler Inhale  1-2 puffs into the lungs every 6 (six) hours as needed for wheezing or shortness of breath. 01/10/19  Yes Long, Wonda Olds, MD  apixaban (ELIQUIS) 2.5 MG TABS tablet Take 1 tablet (2.5 mg total) by mouth 2 (two) times daily. 07/31/20  Yes Verta Ellen., NP  aspirin EC 81 MG tablet Take 81 mg by mouth daily. Swallow whole.   Yes [provider]  fluticasone (FLONASE) 50 MCG/ACT nasal spray Place 2 sprays into both nostrils as needed for up to 14 days. 08/10/20 08/24/20 Yes Johnson, Clanford L, MD  furosemide (LASIX) 80 MG tablet Take 1 tablet (80 mg total) by mouth daily as needed for fluid or edema. 10/11/20  Yes Shah, Pratik D, DO  insulin degludec (TRESIBA) 100 UNIT/ML FlexTouch Pen Inject 56 Units into the skin daily at 10 pm.   Yes [provider]  ipratropium-albuterol (DUONEB) 0.5-2.5 (3) MG/3ML SOLN Inhale 3 mLs into the lungs every 4 (four) hours as needed (shortness of breath). 05/22/20  Yes [provider]  isosorbide mononitrate (IMDUR) 30 MG 24 hr tablet Take 1 tablet (30 mg total) by mouth daily. 04/02/17  Yes Orvan Falconer, MD  levocetirizine (XYZAL) 5 MG tablet Take 5 mg by mouth every evening.  08/27/17  Yes [provider]  levothyroxine (SYNTHROID) 100 MCG tablet Take 100 mcg by mouth daily. (takes with  2107mcg for a total of 325mcg) 01/08/20  Yes [provider]  levothyroxine (SYNTHROID, LEVOTHROID) 200 MCG tablet Take 300 mcg by mouth daily before breakfast. (takes with 175mcg for a total of 338mcg)   Yes [provider]  nitroGLYCERIN (NITROSTAT) 0.4 MG SL tablet Place 0.4 mg under the tongue every 5 (five) minutes as needed for chest pain.  12/27/17  Yes [provider]  OXYGEN Inhale 3 L into the lungs daily as needed.   Yes [provider]  potassium chloride SA (KLOR-CON) 20 MEQ tablet Take 1 tablet (20 mEq total) by mouth 2 (two) times daily. 08/10/20  Yes Johnson, Clanford L, MD  senna-docusate (SENOKOT-S) 8.6-50 MG tablet Take 1 tablet by mouth daily as needed for mild constipation. 02/01/20  Yes [provider]  simvastatin (ZOCOR) 5 MG tablet Take 5 mg by mouth daily. 09/14/16  Yes [provider]  SYMBICORT 160-4.5 MCG/ACT inhaler Inhale 2 puffs into the lungs daily. 12/22/19  Yes [provider]  amLODipine (NORVASC) 10 MG tablet Take 10 mg by mouth daily.    [provider]  losartan (COZAAR) 100 MG tablet Take 1 tablet by mouth daily. Patient not taking: Reported on 11/19/2020 11/13/20   [provider]  nystatin (MYCOSTATIN/NYSTOP) powder Apply topically 2 (two) times daily. Patient not taking: Reported on 11/19/2020 10/11/20   Heath Lark D, DO    Allergies    Patient has no known allergies.  Review of Systems   Review of Systems  Constitutional: Negative for chills and fever.  Respiratory: Positive for cough and shortness of breath.   Cardiovascular: Positive for chest pain (resolved) and leg swelling.  All other systems reviewed and are negative.   Physical Exam Updated Vital Signs BP (!) 175/113 (BP Location: Left Arm) Comment: pt doesnt remember if he took bp meds today  Pulse 71   Temp 97.7 F (36.5 C) (Oral)   Resp (!) 21   Ht 6\' 1"  (1.854 m)   Wt (!) 139.3 kg   SpO2 95%   BMI 40.50 kg/m    Physical Exam Vitals and  nursing note reviewed.  Constitutional:      Appearance: He is obese. He is not ill-appearing or diaphoretic.  HENT:     Head: Normocephalic and atraumatic.  Eyes:     Conjunctiva/sclera: Conjunctivae normal.  Cardiovascular:     Rate and Rhythm: Rhythm irregularly irregular.  Pulmonary:     Effort: Pulmonary effort is normal.     Breath sounds: No decreased breath sounds, wheezing, rhonchi or rales.     Comments: Speaking in full sentences without difficulty. On 4L [place] satting 94%.  Chest:     Chest wall: No tenderness.  Abdominal:     Palpations: Abdomen is soft.     Tenderness: There is no abdominal tenderness.  Musculoskeletal:     Cervical back: Neck supple.     Right lower leg: No tenderness. Edema present.     Left lower leg: No tenderness. Edema present.     Comments: 1+ pitting edema bilaterally. Chronic venous stasis to BLEs.   Skin:    General: Skin is warm and dry.  Neurological:     Mental Status: He is alert.     ED Results / Procedures / Treatments   Labs (all labs ordered are listed, but only abnormal results are displayed) Labs Reviewed  COMPREHENSIVE METABOLIC PANEL - Abnormal; Notable for the following components:      Result Value   Creatinine, Ser 2.25 (*)    Total Bilirubin 1.4 (*)    GFR, Estimated 30 (*)    All other components within normal limits  CBC WITH DIFFERENTIAL/PLATELET - Abnormal; Notable for the following components:   RBC 5.84 (*)    HCT 52.6 (*)    RDW 16.7 (*)    Platelets 135 (*)    All other components within normal limits  BRAIN NATRIURETIC PEPTIDE - Abnormal; Notable for the following components:   B Natriuretic Peptide 187.0 (*)    All other components within normal limits  URINALYSIS, ROUTINE W REFLEX MICROSCOPIC - Abnormal; Notable for the following components:   Protein, ur >=300 (*)    All other components within normal limits  TROPONIN I (HIGH SENSITIVITY) - Abnormal; Notable for the  following components:   Troponin I (High Sensitivity) 33 (*)    All other components within normal limits  TROPONIN I (HIGH SENSITIVITY) - Abnormal; Notable for the following components:   Troponin I (High Sensitivity) 31 (*)    All other components within normal limits    EKG EKG Interpretation  Date/Time:  Tuesday November 19 2020 13:42:19 EDT Ventricular Rate:  106 PR Interval:    QRS Duration: 146 QT Interval:  387 QTC Calculation: 444 R Axis:   -72 Text Interpretation: Atrial fibrillation Ventricular premature complex Nonspecific IVCD with LAD Inferior infarct, old Anterior infarct, old PVC's present since last - otherwise unchanged Confirmed by Noemi Chapel [phone]) on 11/19/2020 3:23:08 PM   Radiology DG Chest Port 1 View  Result Date: 11/19/2020 CLINICAL DATA:  Shortness of breath. EXAM: PORTABLE CHEST 1 VIEW COMPARISON:  10/06/2020. FINDINGS: Cardiomegaly with bilateral interstitial prominence and bilateral pleural effusions. Findings suggest CHF. Similar findings noted on prior exam. Persistent left base atelectasis/infiltrate. No pneumothorax. Degenerative change thoracic spine. IMPRESSION: 1. Cardiomegaly with bilateral interstitial prominence and bilateral pleural effusions. Findings suggest CHF. Similar findings noted on prior exam. 2.  Persistent left base atelectasis/infiltrate. Electronically Signed   By: Marcello Moores  Register   On: 11/19/2020 15:18    Procedures Procedures   Medications Ordered in ED Medications  furosemide (LASIX) injection 80 mg (80 mg Intravenous Given 11/19/20 1733)    ED Course  I have reviewed the triage vital signs and the nursing notes.  Pertinent labs & imaging results that were available during my care of the patient were reviewed by me and considered in my medical decision making (see chart for details).    MDM Rules/Calculators/A&P                          75 year old male with a history of COPD as well as CHF who presents to the ED  today with complaint of worsening shortness of breath that began yesterday.  He reports he has continued to feel short of breath after being admitted to the hospital last month however worsening yesterday.  Arrival to the ED patient is afebrile, nontachycardic.  He is mildly tachypneic with respirations of 23.  On 4 L nasal cannula satting over 90%.  He reports he normally is on 3 L.  He does have some 1+ pitting edema bilaterally.  He reports that he believes he was taken off of his Lasix by his cardiologist recently however it does not appear this per chart review.  He takes the Lasix as needed per cardiology note.  He was taken off of his losartan while in the hospital due to elevated creatinine which may be what patient is thinking of.  He denies any weight gain however feels like his abdomen is slightly more distended than normal.  Concern for CHF exacerbation at this time.  Patient does report improvement after 125 Solu-Medrol and a DuoNeb with EMS.  We will plan to work-up for shortness of breath at this time.  He does mention having 1 episode of chest pain yesterday which does not sound very typical for cardiac etiology however given risk factors and age will work-up for same at this time.  He is anticoagulated on Eliquis for A. Fib.  Low suspicion for PE.   EKG with A fib  CXR with findings consistent with CHF. BNP pending at this time. Will likely need IV diuresis CBC without leukocytosis. Hgb stable CMP with creatinine 2.25 which appears improved from previous  Lab Results  Component Value Date   CREATININE 2.25 (H) 11/19/2020   CREATININE 3.12 (H) 10/11/2020   CREATININE 3.62 (H) 10/10/2020   Troponin 33. Will repeat. Pt without complaints of chest pain.  BNP 187.0. Pt takes 80 mg Lasix daily (however again has only been taking it "as needed"). Will provide IV lasix 80 mg at this time and reevaluate.   U/A negative for infection. He does have proteinuria which appears to have been present  in the past. Will have him follow up with PCP for same.   Repeat troponin 31.  On reevaluation after IV diuresis pt reports improvement in symptoms. He would like to go home at this time. He has been weaned to his 3L with O2 sats remaining at 94%. He is recommended to follow up with his cardiologist regarding ED visit. He will need to take his lasix how they want him to take it whether that is truly as needed or daily like it is prescribed. Pt will need to have potassium and kidney function rechecked in 1 week. He is in agreement with plan and stable for discharge home.   This note was prepared using Dragon voice recognition software and may include unintentional dictation errors due to the inherent limitations of voice recognition software.  Final Clinical Impression(s) / ED Diagnoses Final diagnoses:  Acute on chronic congestive heart failure, unspecified heart failure type ([place])    Rx / DC Orders ED Discharge Orders    None       Discharge Instructions     Please follow up with your cardiologist regarding your ED visit today. It is recommended that you discuss with them how they would like you to take your Lasix. It is prescribed as an everyday medication and you need to discuss with them if they want you to take it every day or only as needed.   You will need to have your potassium and kidney function rechecked in 1 week. Your PCP or cardiologist can do this.   Return to the ED for any worsening symptoms       Eustaquio Maize, Hershal Coria 11/19/20 Paulino Door, MD 11/20/20 [phone]

## 2020-11-19 NOTE — Discharge Instructions (Addendum)
Please follow up with your cardiologist regarding your ED visit today. It is recommended that you discuss with them how they would like you to take your Lasix. It is prescribed as an everyday medication and you need to discuss with them if they want you to take it every day or only as needed.   You will need to have your potassium and kidney function rechecked in 1 week. Your PCP or cardiologist can do this.   Return to the ED for any worsening symptoms

## 2020-11-19 NOTE — ED Triage Notes (Signed)
Pt presented to ED via [facility] for Shortness of breath. Pt at home 3lpm . Pt did one duoneb at home with no relief. EMS gave 125 solumedrol, duoneb 5 mg. Pt on 4 lpm stating over 90% . Pt has a hx of COPD

## 2020-11-20 ENCOUNTER — Telehealth: Payer: Self-pay | Admitting: Family Medicine

## 2020-11-20 NOTE — Telephone Encounter (Signed)
Thank you for clarification. Patient was adamant that his Lasix was changed and asked to double check. I contacted patient and verified everything with him and he verbalized understanding. Patient's medication list reflects all changes made at the hospital.

## 2020-11-20 NOTE — Telephone Encounter (Signed)
Contacted patient who stated that the ED at [facility] put him back on Furosemide 80 mg QD. According to Katina Dung, NP last office note, patient was supposed to be on Furosemide 80 mg QD, but patient denied this and said Jonni Sanger had instructed the patient to use the Furosemide on a PRN basis. Please advise.

## 2020-11-20 NOTE — Telephone Encounter (Signed)
Mr. Miner called stating that he was seen in the ER at [facility] 11-19-2020. He was placed back on 80mg  of Lasix. Advised to contact our office to let us know of medication change.

## 2020-12-04 ENCOUNTER — Other Ambulatory Visit: Payer: Self-pay | Admitting: Cardiology

## 2020-12-04 MED ORDER — ISOSORBIDE MONONITRATE ER 30 MG PO TB24: 30.0000 mg | ORAL_TABLET | Freq: Every day | ORAL | 6 refills | Status: DC

## 2020-12-04 NOTE — Telephone Encounter (Signed)
I agree. Would start 5 mg po bid Eliquis. Thanks

## 2020-12-04 NOTE — Telephone Encounter (Signed)
Jonni Sanger, Based on 2 out of 3 protocol Eliquis 5mg  twice daily would be the appropriate dose.  Pt findings :  Wt 139.3kg  Age 75  SCr 3.31   Do you want to increase dose or leave on 2.5mg  twice daily.Lattie Haw

## 2020-12-05 MED ORDER — ELIQUIS 5 MG PO TABS: 5.0000 mg | ORAL_TABLET | Freq: Two times a day (BID) | ORAL | 6 refills | Status: DC

## 2020-12-05 NOTE — Telephone Encounter (Signed)
Patient notified and verbalized understanding. 

## 2020-12-05 NOTE — Telephone Encounter (Signed)
Gayle, I have sent in refill for Eliquis 5mg  tablet twice daily.  Please call patient and inform him of change. Thanks, Lattie Haw

## 2020-12-28 ENCOUNTER — Emergency Department (HOSPITAL_COMMUNITY): Payer: Medicare HMO

## 2020-12-28 ENCOUNTER — Encounter (HOSPITAL_COMMUNITY): Payer: Self-pay

## 2020-12-28 ENCOUNTER — Inpatient Hospital Stay (HOSPITAL_COMMUNITY)
Admission: EM | Admit: 2020-12-28 | Discharge: 2021-01-02 | DRG: 291 | Disposition: A | Discharge status: Skilled Nursing Facility | Payer: Medicare HMO | Attending: Family Medicine | Admitting: Family Medicine

## 2020-12-28 ENCOUNTER — Inpatient Hospital Stay (HOSPITAL_COMMUNITY): Payer: Medicare HMO

## 2020-12-28 ENCOUNTER — Other Ambulatory Visit: Payer: Self-pay

## 2020-12-28 DIAGNOSIS — J441 Chronic obstructive pulmonary disease with (acute) exacerbation: Secondary | ICD-10-CM | POA: Diagnosis present

## 2020-12-28 DIAGNOSIS — I2510 Atherosclerotic heart disease of native coronary artery without angina pectoris: Secondary | ICD-10-CM | POA: Diagnosis present

## 2020-12-28 DIAGNOSIS — I4819 Other persistent atrial fibrillation: Secondary | ICD-10-CM | POA: Diagnosis present

## 2020-12-28 DIAGNOSIS — E1142 Type 2 diabetes mellitus with diabetic polyneuropathy: Secondary | ICD-10-CM | POA: Diagnosis present

## 2020-12-28 DIAGNOSIS — E785 Hyperlipidemia, unspecified: Secondary | ICD-10-CM | POA: Diagnosis present

## 2020-12-28 DIAGNOSIS — J9621 Acute and chronic respiratory failure with hypoxia: Secondary | ICD-10-CM | POA: Diagnosis present

## 2020-12-28 DIAGNOSIS — Z794 Long term (current) use of insulin: Secondary | ICD-10-CM

## 2020-12-28 DIAGNOSIS — N189 Chronic kidney disease, unspecified: Secondary | ICD-10-CM

## 2020-12-28 DIAGNOSIS — Z79899 Other long term (current) drug therapy: Secondary | ICD-10-CM

## 2020-12-28 DIAGNOSIS — N184 Chronic kidney disease, stage 4 (severe): Secondary | ICD-10-CM

## 2020-12-28 DIAGNOSIS — I5033 Acute on chronic diastolic (congestive) heart failure: Secondary | ICD-10-CM | POA: Diagnosis present

## 2020-12-28 DIAGNOSIS — Z20822 Contact with and (suspected) exposure to covid-19: Secondary | ICD-10-CM | POA: Diagnosis present

## 2020-12-28 DIAGNOSIS — I10 Essential (primary) hypertension: Secondary | ICD-10-CM | POA: Diagnosis not present

## 2020-12-28 DIAGNOSIS — I248 Other forms of acute ischemic heart disease: Secondary | ICD-10-CM | POA: Diagnosis present

## 2020-12-28 DIAGNOSIS — Z803 Family history of malignant neoplasm of breast: Secondary | ICD-10-CM

## 2020-12-28 DIAGNOSIS — Z87891 Personal history of nicotine dependence: Secondary | ICD-10-CM

## 2020-12-28 DIAGNOSIS — N179 Acute kidney failure, unspecified: Secondary | ICD-10-CM | POA: Diagnosis present

## 2020-12-28 DIAGNOSIS — N1831 Chronic kidney disease, stage 3a: Secondary | ICD-10-CM | POA: Diagnosis present

## 2020-12-28 DIAGNOSIS — Z7982 Long term (current) use of aspirin: Secondary | ICD-10-CM | POA: Diagnosis not present

## 2020-12-28 DIAGNOSIS — Z9981 Dependence on supplemental oxygen: Secondary | ICD-10-CM

## 2020-12-28 DIAGNOSIS — J9601 Acute respiratory failure with hypoxia: Secondary | ICD-10-CM | POA: Diagnosis present

## 2020-12-28 DIAGNOSIS — Z923 Personal history of irradiation: Secondary | ICD-10-CM

## 2020-12-28 DIAGNOSIS — J9811 Atelectasis: Secondary | ICD-10-CM | POA: Diagnosis present

## 2020-12-28 DIAGNOSIS — I447 Left bundle-branch block, unspecified: Secondary | ICD-10-CM | POA: Diagnosis present

## 2020-12-28 DIAGNOSIS — E1122 Type 2 diabetes mellitus with diabetic chronic kidney disease: Secondary | ICD-10-CM | POA: Diagnosis present

## 2020-12-28 DIAGNOSIS — S99921A Unspecified injury of right foot, initial encounter: Secondary | ICD-10-CM | POA: Diagnosis present

## 2020-12-28 DIAGNOSIS — I4891 Unspecified atrial fibrillation: Secondary | ICD-10-CM | POA: Diagnosis not present

## 2020-12-28 DIAGNOSIS — Z7901 Long term (current) use of anticoagulants: Secondary | ICD-10-CM | POA: Diagnosis not present

## 2020-12-28 DIAGNOSIS — I509 Heart failure, unspecified: Secondary | ICD-10-CM

## 2020-12-28 DIAGNOSIS — I130 Hypertensive heart and chronic kidney disease with heart failure and stage 1 through stage 4 chronic kidney disease, or unspecified chronic kidney disease: Principal | ICD-10-CM | POA: Diagnosis present

## 2020-12-28 DIAGNOSIS — Z801 Family history of malignant neoplasm of trachea, bronchus and lung: Secondary | ICD-10-CM

## 2020-12-28 DIAGNOSIS — Z6841 Body Mass Index (BMI) 40.0 and over, adult: Secondary | ICD-10-CM | POA: Diagnosis not present

## 2020-12-28 DIAGNOSIS — E876 Hypokalemia: Secondary | ICD-10-CM | POA: Diagnosis present

## 2020-12-28 DIAGNOSIS — Z8616 Personal history of COVID-19: Secondary | ICD-10-CM

## 2020-12-28 DIAGNOSIS — I878 Other specified disorders of veins: Secondary | ICD-10-CM | POA: Diagnosis present

## 2020-12-28 DIAGNOSIS — K56609 Unspecified intestinal obstruction, unspecified as to partial versus complete obstruction: Secondary | ICD-10-CM

## 2020-12-28 DIAGNOSIS — I252 Old myocardial infarction: Secondary | ICD-10-CM

## 2020-12-28 DIAGNOSIS — Z800 Family history of malignant neoplasm of digestive organs: Secondary | ICD-10-CM

## 2020-12-28 DIAGNOSIS — S99929A Unspecified injury of unspecified foot, initial encounter: Secondary | ICD-10-CM

## 2020-12-28 DIAGNOSIS — Z8572 Personal history of non-Hodgkin lymphomas: Secondary | ICD-10-CM

## 2020-12-28 DIAGNOSIS — Z955 Presence of coronary angioplasty implant and graft: Secondary | ICD-10-CM

## 2020-12-28 DIAGNOSIS — Z8042 Family history of malignant neoplasm of prostate: Secondary | ICD-10-CM

## 2020-12-28 DIAGNOSIS — I700 Atherosclerosis of aorta: Secondary | ICD-10-CM | POA: Diagnosis present

## 2020-12-28 DIAGNOSIS — E039 Hypothyroidism, unspecified: Secondary | ICD-10-CM | POA: Diagnosis present

## 2020-12-28 DIAGNOSIS — Z9221 Personal history of antineoplastic chemotherapy: Secondary | ICD-10-CM

## 2020-12-28 DIAGNOSIS — Z79890 Hormone replacement therapy (postmenopausal): Secondary | ICD-10-CM

## 2020-12-28 DIAGNOSIS — Z7951 Long term (current) use of inhaled steroids: Secondary | ICD-10-CM

## 2020-12-28 DIAGNOSIS — Z9049 Acquired absence of other specified parts of digestive tract: Secondary | ICD-10-CM

## 2020-12-28 DIAGNOSIS — Z96652 Presence of left artificial knee joint: Secondary | ICD-10-CM | POA: Diagnosis present

## 2020-12-28 DIAGNOSIS — X58XXXA Exposure to other specified factors, initial encounter: Secondary | ICD-10-CM | POA: Diagnosis present

## 2020-12-28 LAB — ECHOCARDIOGRAM COMPLETE
AR max vel: 1.84 cm2
AV Area VTI: 1.9 cm2
AV Area mean vel: 1.73 cm2
AV Mean grad: 5 mmHg
AV Peak grad: 10.2 mmHg
Ao pk vel: 1.6 m/s
Calc EF: 49.7 %
Height: 73 in
S' Lateral: 4.8 cm
Single Plane A2C EF: 61.6 %
Single Plane A4C EF: 32 %
Weight: 4938.3 oz

## 2020-12-28 LAB — CBC WITH DIFFERENTIAL/PLATELET
Abs Immature Granulocytes: 0.02 10*3/uL (ref 0.00–0.07)
Basophils Absolute: 0 10*3/uL (ref 0.0–0.1)
Basophils Relative: 1 %
Eosinophils Absolute: 0.1 10*3/uL (ref 0.0–0.5)
Eosinophils Relative: 2 %
HCT: 46.4 % (ref 39.0–52.0)
Hemoglobin: 14.3 g/dL (ref 13.0–17.0)
Immature Granulocytes: 0 %
Lymphocytes Relative: 17 %
Lymphs Abs: 1 10*3/uL (ref 0.7–4.0)
MCH: 28.4 pg (ref 26.0–34.0)
MCHC: 30.8 g/dL (ref 30.0–36.0)
MCV: 92.2 fL (ref 80.0–100.0)
Monocytes Absolute: 0.5 10*3/uL (ref 0.1–1.0)
Monocytes Relative: 8 %
Neutro Abs: 4.3 10*3/uL (ref 1.7–7.7)
Neutrophils Relative %: 72 %
Platelets: 127 10*3/uL — ABNORMAL LOW (ref 150–400)
RBC: 5.03 MIL/uL (ref 4.22–5.81)
RDW: 16.7 % — ABNORMAL HIGH (ref 11.5–15.5)
WBC: 5.9 10*3/uL (ref 4.0–10.5)
nRBC: 0 % (ref 0.0–0.2)

## 2020-12-28 LAB — COMPREHENSIVE METABOLIC PANEL
ALT: 8 U/L (ref 0–44)
ALT: 8 U/L (ref 0–44)
AST: 13 U/L — ABNORMAL LOW (ref 15–41)
AST: 13 U/L — ABNORMAL LOW (ref 15–41)
Albumin: 4 g/dL (ref 3.5–5.0)
Albumin: 4.3 g/dL (ref 3.5–5.0)
Alkaline Phosphatase: 91 U/L (ref 38–126)
Alkaline Phosphatase: 98 U/L (ref 38–126)
Anion gap: 7 (ref 5–15)
Anion gap: 9 (ref 5–15)
BUN: 39 mg/dL — ABNORMAL HIGH (ref 8–23)
BUN: 40 mg/dL — ABNORMAL HIGH (ref 8–23)
CO2: 30 mmol/L (ref 22–32)
CO2: 29 mmol/L (ref 22–32)
Calcium: 8.9 mg/dL (ref 8.9–10.3)
Calcium: 9 mg/dL (ref 8.9–10.3)
Chloride: 102 mmol/L (ref 98–111)
Chloride: 101 mmol/L (ref 98–111)
Creatinine, Ser: 2.89 mg/dL — ABNORMAL HIGH (ref 0.61–1.24)
Creatinine, Ser: 2.82 mg/dL — ABNORMAL HIGH (ref 0.61–1.24)
GFR, Estimated: 22 mL/min — ABNORMAL LOW (ref >60)
GFR, Estimated: 23 mL/min — ABNORMAL LOW (ref >60)
Glucose, Bld: 128 mg/dL — ABNORMAL HIGH (ref 70–99)
Glucose, Bld: 128 mg/dL — ABNORMAL HIGH (ref 70–99)
Potassium: 3.4 mmol/L — ABNORMAL LOW (ref 3.5–5.1)
Potassium: 3.7 mmol/L (ref 3.5–5.1)
Sodium: 139 mmol/L (ref 135–145)
Sodium: 139 mmol/L (ref 135–145)
Total Bilirubin: 1 mg/dL (ref 0.3–1.2)
Total Bilirubin: 1 mg/dL (ref 0.3–1.2)
Total Protein: 7.5 g/dL (ref 6.5–8.1)
Total Protein: 8 g/dL (ref 6.5–8.1)

## 2020-12-28 LAB — CBC
HCT: 51.9 % (ref 39.0–52.0)
Hemoglobin: 15.7 g/dL (ref 13.0–17.0)
MCH: 27.9 pg (ref 26.0–34.0)
MCHC: 30.3 g/dL (ref 30.0–36.0)
MCV: 92.2 fL (ref 80.0–100.0)
Platelets: 126 10*3/uL — ABNORMAL LOW (ref 150–400)
RBC: 5.63 MIL/uL (ref 4.22–5.81)
RDW: 16.7 % — ABNORMAL HIGH (ref 11.5–15.5)
WBC: 6 10*3/uL (ref 4.0–10.5)
nRBC: 0 % (ref 0.0–0.2)

## 2020-12-28 LAB — BLOOD GAS, ARTERIAL
Acid-Base Excess: 1.9 mmol/L (ref 0.0–2.0)
Acid-Base Excess: 1 mmol/L (ref 0.0–2.0)
Bicarbonate: 24.6 mmol/L (ref 20.0–28.0)
Bicarbonate: 23.4 mmol/L (ref 20.0–28.0)
FIO2: 52
FIO2: 60
O2 Saturation: 94.6 %
O2 Saturation: 93.3 %
Patient temperature: 37
Patient temperature: 37
pCO2 arterial: 58.2 mmHg — ABNORMAL HIGH (ref 32.0–48.0)
pCO2 arterial: 65.3 mmHg (ref 32.0–48.0)
pH, Arterial: 7.299 — ABNORMAL LOW (ref 7.350–7.450)
pH, Arterial: 7.248 — ABNORMAL LOW (ref 7.350–7.450)
pO2, Arterial: 80.9 mmHg — ABNORMAL LOW (ref 83.0–108.0)
pO2, Arterial: 74.3 mmHg — ABNORMAL LOW (ref 83.0–108.0)

## 2020-12-28 LAB — MAGNESIUM: Magnesium: 2.3 mg/dL (ref 1.7–2.4)

## 2020-12-28 LAB — RESP PANEL BY RT-PCR (FLU A&B, COVID) ARPGX2
Influenza A by PCR: NEGATIVE
Influenza B by PCR: NEGATIVE
SARS Coronavirus 2 by RT PCR: NEGATIVE

## 2020-12-28 LAB — GLUCOSE, CAPILLARY: Glucose-Capillary: 145 mg/dL — ABNORMAL HIGH (ref 70–99)

## 2020-12-28 LAB — CBG MONITORING, ED
Glucose-Capillary: 138 mg/dL — ABNORMAL HIGH (ref 70–99)
Glucose-Capillary: 147 mg/dL — ABNORMAL HIGH (ref 70–99)
Glucose-Capillary: 125 mg/dL — ABNORMAL HIGH (ref 70–99)

## 2020-12-28 LAB — TROPONIN I (HIGH SENSITIVITY)
Troponin I (High Sensitivity): 28 ng/L — ABNORMAL HIGH (ref <18)
Troponin I (High Sensitivity): 31 ng/L — ABNORMAL HIGH (ref <18)

## 2020-12-28 LAB — BRAIN NATRIURETIC PEPTIDE: B Natriuretic Peptide: 266 pg/mL — ABNORMAL HIGH (ref 0.0–100.0)

## 2020-12-28 MED ORDER — PERFLUTREN LIPID MICROSPHERE
1.0000 mL | INTRAVENOUS | Status: AC | PRN
  Administered 2020-12-28: 3 mL via INTRAVENOUS
  Filled 2020-12-28: qty 10

## 2020-12-28 MED ORDER — ISOSORBIDE MONONITRATE ER 60 MG PO TB24
30.0000 mg | ORAL_TABLET | Freq: Every day | ORAL | Status: DC
  Administered 2020-12-28 – 2021-01-02 (×6): 30 mg via ORAL
  Filled 2020-12-28 (×6): qty 1

## 2020-12-28 MED ORDER — METHYLPREDNISOLONE SODIUM SUCC 125 MG IJ SOLR
60.0000 mg | Freq: Four times a day (QID) | INTRAMUSCULAR | Status: DC
  Administered 2020-12-28 – 2020-12-31 (×12): 60 mg via INTRAVENOUS
  Filled 2020-12-28 (×12): qty 2

## 2020-12-28 MED ORDER — INSULIN ASPART 100 UNIT/ML IJ SOLN
0.0000 [IU] | Freq: Every day | INTRAMUSCULAR | Status: DC
Start: 2020-12-28 — End: 2021-01-03
  Administered 2020-12-29: 2 [IU] via SUBCUTANEOUS
  Administered 2020-12-31: 3 [IU] via SUBCUTANEOUS

## 2020-12-28 MED ORDER — ACETAMINOPHEN 325 MG PO TABS
650.0000 mg | ORAL_TABLET | Freq: Four times a day (QID) | ORAL | Status: DC | PRN
  Administered 2021-01-01: 650 mg via ORAL
  Filled 2020-12-28: qty 2

## 2020-12-28 MED ORDER — FUROSEMIDE 10 MG/ML IJ SOLN
80.0000 mg | Freq: Once | INTRAMUSCULAR | Status: AC
  Administered 2020-12-28: 80 mg via INTRAVENOUS
  Filled 2020-12-28: qty 8

## 2020-12-28 MED ORDER — LEVOTHYROXINE SODIUM 100 MCG PO TABS
300.0000 ug | ORAL_TABLET | Freq: Every day | ORAL | Status: DC
  Administered 2020-12-29 – 2021-01-02 (×5): 300 ug via ORAL
  Filled 2020-12-28 (×5): qty 3

## 2020-12-28 MED ORDER — CHLORHEXIDINE GLUCONATE CLOTH 2 % EX PADS
6.0000 | MEDICATED_PAD | Freq: Every day | CUTANEOUS | Status: DC
  Administered 2020-12-29 – 2020-12-31 (×3): 6 via TOPICAL

## 2020-12-28 MED ORDER — ONDANSETRON HCL 4 MG PO TABS: 4.0000 mg | ORAL_TABLET | Freq: Four times a day (QID) | ORAL | Status: DC | PRN

## 2020-12-28 MED ORDER — POTASSIUM CHLORIDE 10 MEQ/100ML IV SOLN
10.0000 meq | INTRAVENOUS | Status: AC
  Administered 2020-12-28 (×3): 10 meq via INTRAVENOUS
  Filled 2020-12-28 (×3): qty 100

## 2020-12-28 MED ORDER — METHYLPREDNISOLONE SODIUM SUCC 125 MG IJ SOLR: 80.0000 mg | Freq: Two times a day (BID) | INTRAMUSCULAR | Status: DC

## 2020-12-28 MED ORDER — LEVOTHYROXINE SODIUM 50 MCG PO TABS: 300.0000 ug | ORAL_TABLET | Freq: Every day | ORAL | Status: DC

## 2020-12-28 MED ORDER — CHLORHEXIDINE GLUCONATE 0.12 % MT SOLN
15.0000 mL | Freq: Two times a day (BID) | OROMUCOSAL | Status: DC
  Administered 2020-12-28 – 2021-01-02 (×10): 15 mL via OROMUCOSAL
  Filled 2020-12-28 (×9): qty 15

## 2020-12-28 MED ORDER — ONDANSETRON HCL 4 MG/2ML IJ SOLN: 4.0000 mg | Freq: Four times a day (QID) | INTRAMUSCULAR | Status: DC | PRN

## 2020-12-28 MED ORDER — IPRATROPIUM-ALBUTEROL 0.5-2.5 (3) MG/3ML IN SOLN
3.0000 mL | RESPIRATORY_TRACT | Status: DC
  Administered 2020-12-28 – 2021-01-01 (×29): 3 mL via RESPIRATORY_TRACT
  Filled 2020-12-28 (×29): qty 3

## 2020-12-28 MED ORDER — INSULIN ASPART 100 UNIT/ML IJ SOLN
0.0000 [IU] | Freq: Three times a day (TID) | INTRAMUSCULAR | Status: DC
Start: 2020-12-28 — End: 2021-01-03
  Administered 2020-12-28 (×3): 2 [IU] via SUBCUTANEOUS
  Administered 2020-12-29 (×3): 3 [IU] via SUBCUTANEOUS
  Administered 2020-12-30: 2 [IU] via SUBCUTANEOUS
  Administered 2020-12-30: 3 [IU] via SUBCUTANEOUS
  Administered 2020-12-30: 2 [IU] via SUBCUTANEOUS
  Administered 2020-12-31 – 2021-01-01 (×3): 3 [IU] via SUBCUTANEOUS
  Administered 2021-01-01 (×2): 2 [IU] via SUBCUTANEOUS
  Administered 2021-01-02: 3 [IU] via SUBCUTANEOUS
  Administered 2021-01-02: 5 [IU] via SUBCUTANEOUS
  Administered 2021-01-02: 2 [IU] via SUBCUTANEOUS
  Filled 2020-12-28 (×3): qty 1

## 2020-12-28 MED ORDER — INSULIN DETEMIR 100 UNIT/ML ~~LOC~~ SOLN
45.0000 [IU] | Freq: Every day | SUBCUTANEOUS | Status: DC
  Administered 2020-12-28 – 2021-01-01 (×5): 45 [IU] via SUBCUTANEOUS
  Filled 2020-12-28 (×6): qty 0.45

## 2020-12-28 MED ORDER — FUROSEMIDE 10 MG/ML IJ SOLN
80.0000 mg | Freq: Two times a day (BID) | INTRAMUSCULAR | Status: DC
  Administered 2020-12-28 (×2): 80 mg via INTRAVENOUS
  Filled 2020-12-28 (×2): qty 8

## 2020-12-28 MED ORDER — BUDESONIDE 0.25 MG/2ML IN SUSP
0.2500 mg | Freq: Two times a day (BID) | RESPIRATORY_TRACT | Status: DC
  Administered 2020-12-28 – 2021-01-02 (×12): 0.25 mg via RESPIRATORY_TRACT
  Filled 2020-12-28 (×12): qty 2

## 2020-12-28 MED ORDER — APIXABAN 5 MG PO TABS
5.0000 mg | ORAL_TABLET | Freq: Two times a day (BID) | ORAL | Status: DC
  Administered 2020-12-28 – 2021-01-02 (×11): 5 mg via ORAL
  Filled 2020-12-28 (×11): qty 1

## 2020-12-28 MED ORDER — METHYLPREDNISOLONE SODIUM SUCC 125 MG IJ SOLR
80.0000 mg | Freq: Once | INTRAMUSCULAR | Status: AC
  Administered 2020-12-28: 80 mg via INTRAVENOUS
  Filled 2020-12-28: qty 2

## 2020-12-28 MED ORDER — AMLODIPINE BESYLATE 5 MG PO TABS
10.0000 mg | ORAL_TABLET | Freq: Every day | ORAL | Status: DC
  Administered 2020-12-28 – 2021-01-02 (×6): 10 mg via ORAL
  Filled 2020-12-28 (×6): qty 2

## 2020-12-28 MED ORDER — FLUTICASONE FUROATE-VILANTEROL 200-25 MCG/INH IN AEPB: 1.0000 | INHALATION_SPRAY | Freq: Every day | RESPIRATORY_TRACT | Status: DC

## 2020-12-28 MED ORDER — BUDESONIDE 0.25 MG/2ML IN SUSP: 0.2500 mg | Freq: Two times a day (BID) | RESPIRATORY_TRACT | Status: DC

## 2020-12-28 MED ORDER — ORAL CARE MOUTH RINSE
15.0000 mL | Freq: Two times a day (BID) | OROMUCOSAL | Status: DC
  Administered 2020-12-30 – 2021-01-02 (×6): 15 mL via OROMUCOSAL

## 2020-12-28 MED ORDER — ASPIRIN EC 81 MG PO TBEC
81.0000 mg | DELAYED_RELEASE_TABLET | Freq: Every day | ORAL | Status: DC
  Administered 2020-12-28 – 2021-01-02 (×6): 81 mg via ORAL
  Filled 2020-12-28 (×7): qty 1

## 2020-12-28 MED ORDER — OXYCODONE HCL 5 MG PO TABS: 5.0000 mg | ORAL_TABLET | ORAL | Status: DC | PRN

## 2020-12-28 MED ORDER — SIMVASTATIN 10 MG PO TABS
5.0000 mg | ORAL_TABLET | Freq: Every day | ORAL | Status: DC
  Administered 2020-12-28 – 2020-12-29 (×2): 5 mg via ORAL
  Filled 2020-12-28 (×3): qty 1

## 2020-12-28 MED ORDER — ACETAMINOPHEN 650 MG RE SUPP: 650.0000 mg | Freq: Four times a day (QID) | RECTAL | Status: DC | PRN

## 2020-12-28 MED ORDER — LEVOTHYROXINE SODIUM 50 MCG PO TABS: 100.0000 ug | ORAL_TABLET | Freq: Every day | ORAL | Status: DC

## 2020-12-28 NOTE — ED Notes (Signed)
Had assistance pulling pt up in bed RN [facility]

## 2020-12-28 NOTE — Progress Notes (Signed)
2D echocardiogram with Def[facility] completed.  12/28/2020 10:48 AM Kelby Aline., MHA, RVT, RDCS, RDMS

## 2020-12-28 NOTE — Progress Notes (Signed)
PROGRESS NOTE    Patient: [name] [name]                            PCP: Nickola Major, MD                    DOB: October 27, 1945            DOA: 12/28/2020 HYW:[id number]             DOS: 12/28/2020, 11:34 AM   LOS: 0 days   Date of Service: The patient was seen and examined on 12/28/2020  Subjective:   The patient was seen and examined this morning. Remains in respiratory failure, on BiPAP.Marland Kitchen  Respiratory effort labored mildly, currently tolerating BiPAP, awake alert. On BiPAP with O2 rate of 12, FiO2 of 60%, ABG 7.24/PCO2 65.3/PO2 of 74.3  Brief Narrative:    [name] [name]  is a 75 y.o. male, with history of type 2 diabetes mellitus, morbid obesity, coronary artery disease, non-Hodgkin's lymphoma, hypothyroidism, hyperlipidemia, essential hypertension, CKD stage III, atrial fibrillation, and more presents the ED with chief complaint of dyspnea.  O2 dependent 3 L at baseline..  Progressively worsening shortness of breath over 2 days   He admits to a cough that is productive of yellow sputum occasionally, noted edema in his legs, missed some home medications    He is vaccinated for COVID.  He is full code but does not want long-term life support.  In the ED  patient was afebrile, heart rate 67-84, respiratory rate 18-22, blood pressure 150/94, satting at 99% on 6 L No leukocytosis, chemistry reveals a creatinine that is at patient's baseline around 2.89 BNP is elevated at 26 Trope was minimally elevated at 28, repeat pending Respiratory panel is negative Chest x-ray shows cardiomegaly with vascular congestion and pulmonary edema with pleural effusions greater on the left than the right and left basilar atelectasis -80 mg of Lasix given in the ED -Solu-Medrol 80 mg given in the ED EKG shows A. fib heart rate 77, QTc 483 Last echo was in December 2021 showed ejection fraction of 50 to 55% with grade 2 diastolic dysfunction   Assessment & Plan:   Principal Problem:   CHF  exacerbation ([place]) Active Problems:   Type 2 diabetes mellitus with stage 4 chronic kidney disease (HCC)   Hypertension   Hypothyroidism   Acute respiratory failure with hypoxia (HCC)   CHF (congestive heart failure) (HCC)  respiratory failure with hypoxia ([place])   1. Acute respiratory failure with hypoxia 1. Based on presentation likely CHF exacerbation, with underlying COPD 2. At 3 L patient was noted to be as low as 83%, baseline 3 L, was increased to 6 L now on BiPAP 3. On BiPAP with O2 rate of 12, FiO2 of 60%, ABG 7.24/PCO2 65.3/PO2 of 74.3 4. Res panel Negative 5. CXR indicative of CHF 6. Continue Solu-Medrol, DuoNeb bronchodilator treatment as needed 7.  2. CHF exacerbation 1. BNP 266 2. CXR with cardiomegaly and vascular congestion 3. Mildly elevated troponin, consistent with ischemic demand recycling troponins 28, 31 4. Continue evaluation for 80 mg twice daily 5. We will monitor daily weight, I's and O's 6. Fluid restriction 7. We will continue home medications of  asa, imdur, and statin 3. Hypokalemia 1. Potassium 3.4 2. IV K+ replace and recheck 3. Monitoring 4. HTN 1. Continue amlodipine 2. Stable 5. DMII 1. Checking CBG QA CHS, SSI coverage 2. Continue  long-acting insulin 3. Anticipating hyperglycemia due to steroids 6. Hypothyroidism 1. Continue synthroid 2. Stable 7. Afib 1. Continue Eliquis 2. Stable 8. Toe injury 1. X-ray right foot -reviewed negative for any abnormality or fracture 2. As needed analgesics  9.  Acute on chronic kidney disease stage IIIa  1.  Possible cardiorenal syndrome BUN 39, 40, creatinine 2.89, 2.82    ------------------------------------------------------------------------------------------------------------------------------------- Nutritional status:  The patient's BMI is: Body mass index is 40.72 kg/m. I agree with the assessment and plan as outlined  -------------------------------------------------------------------------------------------------------------------------------------- Cultures; None  Antimicrobials: None    Consultants: card   ------------------------------------------------------------------------------------------------------------------------------------------------  DVT prophylaxis:  SCD/Compression stockings and OE:HOZYYQM Code Status:   Code Status: Full Code  Family Communication: No family member present at bedside- attempt will be made to update daily The above findings and plan of care has been discussed with patient (and family)  in detail,  they expressed understanding and agreement of above. -Advance care planning has been discussed.   Admission status:   Status is: Inpatient  Remains inpatient appropriate because:Hemodynamically unstable and Inpatient level of care appropriate due to severity of illness   Dispo: The patient is from: Home              Anticipated d/c is to: Home              Patient currently is not medically stable to d/c.  As patient remains in respiratory failure, requiring  BiPAP   Difficult to place patient No      Level of care: Stepdown   Procedures:   No admission procedures for hospital encounter.     Antimicrobials:  Anti-infectives (From admission, onward)   None       Medication:  . amLODipine  10 mg Oral Daily  . apixaban  5 mg Oral BID  . aspirin EC  81 mg Oral Daily  . budesonide (PULMICORT) nebulizer solution  0.25 mg Nebulization BID  . furosemide  80 mg Intravenous Q12H  . insulin aspart  0-15 Units Subcutaneous TID WC  . insulin aspart  0-5 Units Subcutaneous QHS  . insulin detemir  45 Units Subcutaneous QHS  . ipratropium-albuterol  3 mL Inhalation Q4H  . isosorbide mononitrate  30 mg Oral Daily  . levothyroxine  300 mcg Oral Q0600  . methylPREDNISolone (SOLU-MEDROL) injection  60 mg Intravenous Q6H  . simvastatin  5 mg Oral Daily     acetaminophen **OR** acetaminophen, ondansetron **OR** ondansetron (ZOFRAN) IV, oxyCODONE   Objective:   Vitals:   12/28/20 0630 12/28/20 0700 12/28/20 0800 12/28/20 1030  BP: 139/78 (!) 159/94 (!) 146/89 136/87  Pulse: 69 60 72 89  Resp: 14 17 18  (!) 23  Temp:      TempSrc:      SpO2: 98% 94% 93% 96%  Weight:      Height:        Intake/Output Summary (Last 24 hours) at 12/28/2020 1134 Last data filed at 12/28/2020 0846 Gross per 24 hour  Intake 298.38 ml  Output 275 ml  Net 23.38 ml   Filed Weights   12/28/20 0235  Weight: (!) 140 kg     Examination:   Physical Exam  Constitution:  Alert, cooperative, in mild to moderate distress with shortness of breath, BiPAP mask on her face Psychiatric: Stable mood cognition intact HEENT: Normocephalic, PERRL, otherwise with in Normal limits  Chest:Chest symmetric Cardio vascular:  S1/S2, RRR, No murmure, No Rubs or Gallops  pulmonary: Bilateral diffuse rhonchi, mild wheezing positive  breath sounds, diminished in lower lobes increase respiratory effort negative any bilateral lower lobe crackles  Abdomen: Soft, non-tender, non-distended, bowel sounds,no masses, no organomegaly Muscular skeletal: Limited exam - in bed, able to move all 4 extremities, Normal strength,  Neuro: CNII-XII intact. , normal motor and sensation, reflexes intact  Extremities: No pitting edema lower extremities, +2 pulses  Skin: Dry, warm to touch, negative for any Rashes, No open wounds Wounds: per nursing documentation    ------------------------------------------------------------------------------------------------------------------------------------------    LABs:  CBC Latest Ref Rng & Units 12/28/2020 12/28/2020 11/19/2020  WBC 4.0 - 10.5 K/uL 6.0 5.9 5.8  Hemoglobin 13.0 - 17.0 g/dL 15.7 14.3 16.6  Hematocrit 39.0 - 52.0 % 51.9 46.4 52.6(H)  Platelets 150 - 400 K/uL 126(L) 127(L) 135(L)   CMP Latest Ref Rng & Units 12/28/2020 12/28/2020  11/19/2020  Glucose 70 - 99 mg/dL 128(H) 128(H) 91  BUN 8 - 23 mg/dL 40(H) 39(H) 21  Creatinine 0.61 - 1.24 mg/dL 2.82(H) 2.89(H) 2.25(H)  Sodium 135 - 145 mmol/L 139 139 139  Potassium 3.5 - 5.1 mmol/L 3.7 3.4(L) 3.8  Chloride 98 - 111 mmol/L 101 102 104  CO2 22 - 32 mmol/L 29 30 26   Calcium 8.9 - 10.3 mg/dL 9.0 8.9 9.2  Total Protein 6.5 - 8.1 g/dL 8.0 7.5 7.9  Total Bilirubin 0.3 - 1.2 mg/dL 1.0 1.0 1.4(H)  Alkaline Phos 38 - 126 U/L 98 91 91  AST 15 - 41 U/L 13(L) 13(L) 15  ALT 0 - 44 U/L 8 8 9        Micro Results Recent Results (from the past 240 hour(s))  Resp Panel by RT-PCR (Flu A&B, Covid) Nasopharyngeal Swab     Status: None   Collection Time: 12/28/20  2:49 AM   Specimen: Nasopharyngeal Swab; Nasopharyngeal(NP) swabs in vial transport medium  Result Value Ref Range Status   SARS Coronavirus 2 by RT PCR NEGATIVE NEGATIVE Final    Comment: (NOTE) SARS-CoV-2 target nucleic acids are NOT DETECTED.  The SARS-CoV-2 RNA is generally detectable in upper respiratory specimens during the acute phase of infection. The lowest concentration of SARS-CoV-2 viral copies this assay can detect is 138 copies/mL. A negative result does not preclude SARS-Cov-2 infection and should not be used as the sole basis for treatment or other patient management decisions. A negative result may occur with  improper specimen collection/handling, submission of specimen other than nasopharyngeal swab, presence of viral mutation(s) within the areas targeted by this assay, and inadequate number of viral copies(<138 copies/mL). A negative result must be combined with clinical observations, patient history, and epidemiological information. The expected result is Negative.  Fact Sheet for Patients:  EntrepreneurPulse.com.au  Fact Sheet for Healthcare Providers:  IncredibleEmployment.be  This test is no t yet approved or cleared by the [place] FDA and  has  been authorized for detection and/or diagnosis of SARS-CoV-2 by FDA under an Emergency Use Authorization (EUA). This EUA will remain  in effect (meaning this test can be used) for the duration of the COVID-19 declaration under Section 564(b)(1) of the Act, 21 U.S.C.section 360bbb-3(b)(1), unless the authorization is terminated  or revoked sooner.       Influenza A by PCR NEGATIVE NEGATIVE Final   Influenza B by PCR NEGATIVE NEGATIVE Final    Comment: (NOTE) The Xpert Xpress SARS-CoV-2/FLU/RSV plus assay is intended as an aid in the diagnosis of influenza from Nasopharyngeal swab specimens and should not be used as a sole basis for treatment. Nasal washings and  aspirates are unacceptable for Xpert Xpress SARS-CoV-2/FLU/RSV testing.  Fact Sheet for Patients: EntrepreneurPulse.com.au  Fact Sheet for Healthcare Providers: IncredibleEmployment.be  This test is not yet approved or cleared by the [place] FDA and has been authorized for detection and/or diagnosis of SARS-CoV-2 by FDA under an Emergency Use Authorization (EUA). This EUA will remain in effect (meaning this test can be used) for the duration of the COVID-19 declaration under Section 564(b)(1) of the Act, 21 U.S.C. section 360bbb-3(b)(1), unless the authorization is terminated or revoked.  Performed at [facility], [address] [place], [place] [address]     Radiology Reports DG Chest Portable 1 View  Result Date: 12/28/2020 CLINICAL DATA:  Shortness of breath EXAM: PORTABLE CHEST 1 VIEW COMPARISON:  11/19/2020 FINDINGS: Cardiomegaly with vascular congestion and diffuse interstitial opacity consistent with pulmonary edema. Small left greater than right pleural effusion. Basilar airspace disease on the left. Aortic atherosclerosis. No pneumothorax IMPRESSION: 1. Cardiomegaly with vascular congestion and pulmonary edema. 2. Small left greater than right pleural effusions with left  basilar atelectasis or pneumonia. Similar findings on 11/19/2020 comparison Electronically Signed   By: Donavan Foil M.D.   On: 12/28/2020 03:20   DG Toe 3rd Right  Result Date: 12/28/2020 CLINICAL DATA:  Right third toe injury EXAM: RIGHT THIRD TOE COMPARISON:  None. FINDINGS: There is no evidence of fracture or dislocation. There is no evidence of arthropathy or other focal bone abnormality. Soft tissues are unremarkable. IMPRESSION: Negative. Electronically Signed   By: Monte Fantasia M.D.   On: 12/28/2020 05:39    SIGNED: Deatra James, MD, FHM. [facility],  Pager (please use [email or web address] to page/text) Please use Epic Secure Chat for non-urgent communication (7AM-7PM)  If 7PM-7AM, please contact night-coverage [email or web address], 12/28/2020, 11:34 AM

## 2020-12-28 NOTE — ED Notes (Signed)
Pt type on hose kept popping loose from machine. RN [facility] was present

## 2020-12-28 NOTE — ED Notes (Signed)
Helped clean up pt ,helped change [facility] NT put pt on a male wick.

## 2020-12-28 NOTE — H&P (Signed)
TRH H&P    Patient Demographics:    [name] [name], is a 75 y.o. male  MRN: [id number]  DOB - Sep 22, 1945  Admit Date - 12/28/2020  Referring MD/NP/PA: [place]  Outpatient Primary MD for the patient is Nickola Major, MD  Patient coming from: Home  Chief complaint- Dyspnea   HPI:    [name] [name]  is a 75 y.o. male, with history of type 2 diabetes mellitus, morbid obesity, coronary artery disease, non-Hodgkin's lymphoma, hypothyroidism, hyperlipidemia, essential hypertension, CKD stage III, atrial fibrillation, and more presents the ED with chief complaint of dyspnea.  Patient reports that the dyspnea started 2 days ago gradual onset, progressively worse, and then acutely worse this morning.  He wears 3 L nasal cannula at baseline, and did not attempt to change the oxygen supplementation at home.  He reports that was worse with exertion, better with rest, but he cannot lay flat.  He denies chest pain or palpitations.  He admits to a cough that is productive of yellow sputum occasionally.  He admits to increased fluid retention in his legs as well.  He reports that he follows a low-sodium diet, but that he has missed some of his medication doses.  Patient reports he has noticed a decrease in urine output at home.  Patient has no other complaints at this time.  Patient does not smoke, does not drink alcohol, does not use illicit drugs.  He is vaccinated for COVID.  He is full code but does not want long-term life support.  In the ED  patient was afebrile, heart rate 67-84, respiratory rate 18-22, blood pressure 150/94, satting at 99% on 6 L No leukocytosis, chemistry reveals a creatinine that is at patient's baseline around 2.89 BNP is elevated at 266 [place] was minimally elevated at 28, repeat pending Respiratory panel is negative Chest x-ray shows cardiomegaly with vascular congestion and pulmonary edema with pleural  effusions greater on the left than the right and left basilar atelectasis 80 mg of Lasix given in the ED Solu-Medrol 80 mg given in the ED EKG shows A. fib heart rate 77, QTc 483 Last echo was in December 2021 showed ejection fraction of 50 to 55% with grade 2 diastolic dysfunction    Review of systems:    In addition to the HPI above,  No Fever-chills, No Headache, No changes with Vision or hearing, No problems swallowing food or Liquids, No Chest pain, admits to cough and shortness of breath No Abdominal pain, No Nausea or Vomiting, bowel movements are regular, No Blood in stool or Urine, No dysuria, No new skin rashes or bruises, No new joints pains-aches,  No new weakness, tingling, numbness in any extremity, No polyuria, polydypsia or polyphagia, No significant Mental Stressors.  All other systems reviewed and are negative.    Past History of the following :    Past Medical History:  Diagnosis Date  . Atrial fibrillation ([place])   . CAD (coronary artery disease)    DES to LAD June 2018  . CKD (chronic kidney disease) stage  3, GFR 30-59 ml/min (HCC)   . Essential hypertension   . History of pneumonia   . Hyperlipidemia   . Hypothyroidism   . LBBB (left bundle branch block)   . Morbid obesity ([place])   . Non Hodgkin's lymphoma ([place])    Status post XRT and chemotherapy  . NSTEMI (non-ST elevated myocardial infarction) [facility])    June 2018  . Peripheral neuropathy   . Pneumonia due to COVID-19 virus   . Sleep apnea   . Type 2 diabetes mellitus ([place])       Past Surgical History:  Procedure Laterality Date  . CHOLECYSTECTOMY  1992  . COLONOSCOPY N/A 09/03/2014   SLF:six colon polyps removed/small internal hemorrhoids  . CORONARY STENT INTERVENTION N/A 01/21/2017   Procedure: Coronary Stent Intervention;  Surgeon: Nelva Bush, MD;  Location: [place] [facility];  Service: Cardiovascular;  Laterality: N/A;  . ESOPHAGOGASTRODUODENOSCOPY N/A 09/03/2014   SLF: mild  gastritis/few gastric polyps  . LEFT HEART CATH AND CORONARY ANGIOGRAPHY N/A 01/20/2017   Procedure: Left Heart Cath and Coronary Angiography;  Surgeon: Jettie Booze, MD;  Location: [place] [facility];  Service: Cardiovascular;  Laterality: N/A;  . TOTAL KNEE ARTHROPLASTY  11/10/2011   Procedure: TOTAL KNEE ARTHROPLASTY;  Surgeon: Mauri Pole, MD;  Location: [facility];  Service: Orthopedics;  Laterality: Right;  . TOTAL KNEE ARTHROPLASTY Left 05/24/2018   Procedure: LEFT TOTAL KNEE ARTHROPLASTY;  Surgeon: Melrose Nakayama, MD;  Location: [facility];  Service: Orthopedics;  Laterality: Left;      Social History:      Social History   Tobacco Use  . Smoking status: Former Smoker    Packs/day: 0.25    Years: 30.00    Pack years: 7.50    Types: Cigarettes    Quit date: 10/2019    Years since quitting: 1.2  . Smokeless tobacco: Never Used  Substance Use Topics  . Alcohol use: No    Alcohol/week: 0.0 standard drinks       Family History :     Family History  Problem Relation Age of Onset  . Cancer Mother        breast and lung  . Cancer Father        bladder  . Cancer Maternal Uncle        prostate  . Cancer Paternal Uncle        esophagus  . Colon cancer Neg Hx       Home Medications:   Prior to Admission medications   Medication Sig Start Date End Date Taking? Authorizing Provider  acetaminophen (TYLENOL) 325 MG tablet Take 2 tablets (650 mg total) by mouth every 6 (six) hours as needed for mild pain (or Fever >/= 101). 02/01/20   Roxan Hockey, MD  albuterol (VENTOLIN HFA) 108 (90 Base) MCG/ACT inhaler Inhale 1-2 puffs into the lungs every 6 (six) hours as needed for wheezing or shortness of breath. 01/10/19   Long, Wonda Olds, MD  amLODipine (NORVASC) 10 MG tablet Take 10 mg by mouth daily.    [provider]  apixaban (ELIQUIS) 5 MG TABS tablet Take 1 tablet (5 mg total) by mouth 2 (two) times daily. This is a dose change 12/05/20   Verta Ellen., NP   aspirin EC 81 MG tablet Take 81 mg by mouth daily. Swallow whole.    [provider]  fluticasone (FLONASE) 50 MCG/ACT nasal spray Place 2 sprays into both nostrils as needed for up to  14 days. 08/10/20 08/24/20  Murlean Iba, MD  furosemide (LASIX) 80 MG tablet Take 1 tablet (80 mg total) by mouth daily as needed for fluid or edema. 10/11/20   Manuella Ghazi, Pratik D, DO  insulin degludec (TRESIBA) 100 UNIT/ML FlexTouch Pen Inject 56 Units into the skin daily at 10 pm.    [provider]  ipratropium-albuterol (DUONEB) 0.5-2.5 (3) MG/3ML SOLN Inhale 3 mLs into the lungs every 4 (four) hours as needed (shortness of breath). 05/22/20   [provider]  isosorbide mononitrate (IMDUR) 30 MG 24 hr tablet Take 1 tablet (30 mg total) by mouth daily. 12/04/20   Satira Sark, MD  levocetirizine (XYZAL) 5 MG tablet Take 5 mg by mouth every evening.  08/27/17   [provider]  levothyroxine (SYNTHROID) 100 MCG tablet Take 100 mcg by mouth daily. (takes with 285mcg for a total of 324mcg) 01/08/20   [provider]  levothyroxine (SYNTHROID, LEVOTHROID) 200 MCG tablet Take 300 mcg by mouth daily before breakfast. (takes with 172mcg for a total of 353mcg)    [provider]  nitroGLYCERIN (NITROSTAT) 0.4 MG SL tablet Place 0.4 mg under the tongue every 5 (five) minutes as needed for chest pain.  12/27/17   [provider]  nystatin (MYCOSTATIN/NYSTOP) powder Apply topically 2 (two) times daily. Patient not taking: Reported on 11/19/2020 10/11/20   Heath Lark D, DO  OXYGEN Inhale 3 L into the lungs daily as needed.    [provider]  potassium chloride SA (KLOR-CON) 20 MEQ tablet Take 1 tablet (20 mEq total) by mouth 2 (two) times daily. 08/10/20   Johnson, Clanford L, MD  senna-docusate (SENOKOT-S) 8.6-50 MG tablet Take 1 tablet by mouth daily as needed for mild constipation. 02/01/20   [provider]  simvastatin (ZOCOR) 5 MG tablet Take  5 mg by mouth daily. 09/14/16   [provider]  SYMBICORT 160-4.5 MCG/ACT inhaler Inhale 2 puffs into the lungs daily. 12/22/19   [provider]     Allergies:    No Known Allergies   Physical Exam:   Vitals  Blood pressure (!) 150/94, pulse 68, temperature 97.8 F (36.6 C), temperature source Oral, resp. rate 20, height 6\' 1"  (1.854 m), weight (!) 140 kg, SpO2 99 %.  1.  General: Patient lying supine in bed, head of bed elevated, no acute distress  2. Psychiatric: Mood and behavior normal for situation, alert and oriented x3, cooperative with exam  3. Neurologic: Face is symmetric, speech and language are normal, moves all 4 extremities voluntarily, no focal deficits on limited exam  4. HEENMT:  Head is atraumatic, normocephalic, pupils are reactive to light, neck is supple, trachea is midline, mucous membranes are moist  5. Respiratory : Lungs with crackles at the bases, diffuse wheezing, no cyanosis, no clubbing  6. Cardiovascular : Heart rate is normal, rhythm is regular, no murmurs rubs or gallops, 2-3+ pitting edema in lower extremities bilaterally  7. Gastrointestinal:  Abdomen is soft, nondistended, nontender to palpation  8. Skin:  Skin with venous stasis changes in both lower EXTR remedies, erythema over the third digit of the right foot  9.Musculoskeletal:  Edema in both lower extremities, discoloration and edema of third digit right foot    Data Review:    CBC Recent Labs  Lab 12/28/20 0236  WBC 5.9  HGB 14.3  HCT 46.4  PLT 127*  MCV 92.2  MCH 28.4  MCHC 30.8  RDW 16.7*  LYMPHSABS  1.0  MONOABS 0.5  EOSABS 0.1  BASOSABS 0.0   ------------------------------------------------------------------------------------------------------------------  Results for orders placed or performed during the hospital encounter of 12/28/20 (from the past 48 hour(s))  CBC with Differential/Platelet     Status: Abnormal   Collection Time:  12/28/20  2:36 AM  Result Value Ref Range   WBC 5.9 4.0 - 10.5 K/uL   RBC 5.03 4.22 - 5.81 MIL/uL   Hemoglobin 14.3 13.0 - 17.0 g/dL   HCT 46.4 39.0 - 52.0 %   MCV 92.2 80.0 - 100.0 fL   MCH 28.4 26.0 - 34.0 pg   MCHC 30.8 30.0 - 36.0 g/dL   RDW 16.7 (H) 11.5 - 15.5 %   Platelets 127 (L) 150 - 400 K/uL   nRBC 0.0 0.0 - 0.2 %   Neutrophils Relative % 72 %   Neutro Abs 4.3 1.7 - 7.7 K/uL   Lymphocytes Relative 17 %   Lymphs Abs 1.0 0.7 - 4.0 K/uL   Monocytes Relative 8 %   Monocytes Absolute 0.5 0.1 - 1.0 K/uL   Eosinophils Relative 2 %   Eosinophils Absolute 0.1 0.0 - 0.5 K/uL   Basophils Relative 1 %   Basophils Absolute 0.0 0.0 - 0.1 K/uL   Immature Granulocytes 0 %   Abs Immature Granulocytes 0.02 0.00 - 0.07 K/uL    Comment: Performed at [facility], [address], [place], [place] [address]  Comprehensive metabolic panel     Status: Abnormal   Collection Time: 12/28/20  2:36 AM  Result Value Ref Range   Sodium 139 135 - 145 mmol/L   Potassium 3.4 (L) 3.5 - 5.1 mmol/L   Chloride 102 98 - 111 mmol/L   CO2 30 22 - 32 mmol/L   Glucose, Bld 128 (H) 70 - 99 mg/dL    Comment: Glucose reference range applies only to samples taken after fasting for at least 8 hours.   BUN 39 (H) 8 - 23 mg/dL   Creatinine, Ser 2.89 (H) 0.61 - 1.24 mg/dL   Calcium 8.9 8.9 - 10.3 mg/dL   Total Protein 7.5 6.5 - 8.1 g/dL   Albumin 4.0 3.5 - 5.0 g/dL   AST 13 (L) 15 - 41 U/L   ALT 8 0 - 44 U/L   Alkaline Phosphatase 91 38 - 126 U/L   Total Bilirubin 1.0 0.3 - 1.2 mg/dL   GFR, Estimated 22 (L) >60 mL/min    Comment: (NOTE) Calculated using the CKD-EPI Creatinine Equation (2021)    Anion gap 7 5 - 15    Comment: Performed at [facility], [address], [place], [place] [address]  Brain natriuretic peptide     Status: Abnormal   Collection Time: 12/28/20  2:36 AM  Result Value Ref Range   B Natriuretic Peptide 266.0 (H) 0.0 - 100.0 pg/mL    Comment: Performed at [facility], [address], [place], [place] [address]  Troponin I (High Sensitivity)     Status: Abnormal   Collection Time: 12/28/20  2:36 AM  Result Value Ref Range   Troponin I (High Sensitivity) 28 (H) <18 ng/L    Comment: (NOTE) Elevated high sensitivity troponin I (hsTnI) values and significant  changes across serial measurements may suggest ACS but many other  chronic and acute conditions are known to elevate hsTnI results.  Refer to the "Links" section for chest pain algorithms and additional  guidance. Performed at [facility], [address] [place],  [address]   Resp  Panel by RT-PCR (Flu A&B, Covid) Nasopharyngeal Swab     Status: None   Collection Time: 12/28/20  2:49 AM   Specimen: Nasopharyngeal Swab; Nasopharyngeal(NP) swabs in vial transport medium  Result Value Ref Range   SARS Coronavirus 2 by RT PCR NEGATIVE NEGATIVE    Comment: (NOTE) SARS-CoV-2 target nucleic acids are NOT DETECTED.  The SARS-CoV-2 RNA is generally detectable in upper respiratory specimens during the acute phase of infection. The lowest concentration of SARS-CoV-2 viral copies this assay can detect is 138 copies/mL. A negative result does not preclude SARS-Cov-2 infection and should not be used as the sole basis for treatment or other patient management decisions. A negative result may occur with  improper specimen collection/handling, submission of specimen other than nasopharyngeal swab, presence of viral mutation(s) within the areas targeted by this assay, and inadequate number of viral copies(<138 copies/mL). A negative result must be combined with clinical observations, patient history, and epidemiological information. The expected result is Negative.  Fact Sheet for Patients:  EntrepreneurPulse.com.au  Fact Sheet for Healthcare Providers:  IncredibleEmployment.be  This test is no t yet approved or cleared by the [place] FDA and  has been authorized for detection  and/or diagnosis of SARS-CoV-2 by FDA under an Emergency Use Authorization (EUA). This EUA will remain  in effect (meaning this test can be used) for the duration of the COVID-19 declaration under Section 564(b)(1) of the Act, 21 U.S.C.section 360bbb-3(b)(1), unless the authorization is terminated  or revoked sooner.       Influenza A by PCR NEGATIVE NEGATIVE   Influenza B by PCR NEGATIVE NEGATIVE    Comment: (NOTE) The Xpert Xpress SARS-CoV-2/FLU/RSV plus assay is intended as an aid in the diagnosis of influenza from Nasopharyngeal swab specimens and should not be used as a sole basis for treatment. Nasal washings and aspirates are unacceptable for Xpert Xpress SARS-CoV-2/FLU/RSV testing.  Fact Sheet for Patients: EntrepreneurPulse.com.au  Fact Sheet for Healthcare Providers: IncredibleEmployment.be  This test is not yet approved or cleared by the [place] FDA and has been authorized for detection and/or diagnosis of SARS-CoV-2 by FDA under an Emergency Use Authorization (EUA). This EUA will remain in effect (meaning this test can be used) for the duration of the COVID-19 declaration under Section 564(b)(1) of the Act, 21 U.S.C. section 360bbb-3(b)(1), unless the authorization is terminated or revoked.  Performed at [facility], [address], [place], [place] [address]     Chemistries  Recent Labs  Lab 12/28/20 0236  NA 139  K 3.4*  CL 102  CO2 30  GLUCOSE 128*  BUN 39*  CREATININE 2.89*  CALCIUM 8.9  AST 13*  ALT 8  ALKPHOS 91  BILITOT 1.0   ------------------------------------------------------------------------------------------------------------------  ------------------------------------------------------------------------------------------------------------------ GFR: Estimated Creatinine Clearance: 33.5 mL/min (A) (by C-G formula based on SCr of 2.89 mg/dL (H)). Liver Function Tests: Recent Labs  Lab  12/28/20 0236  AST 13*  ALT 8  ALKPHOS 91  BILITOT 1.0  PROT 7.5  ALBUMIN 4.0   No results for input(s): LIPASE, AMYLASE in the last 168 hours. No results for input(s): AMMONIA in the last 168 hours. Coagulation Profile: No results for input(s): INR, PROTIME in the last 168 hours. Cardiac Enzymes: No results for input(s): CKTOTAL, CKMB, CKMBINDEX, TROPONINI in the last 168 hours. BNP (last 3 results) No results for input(s): PROBNP in the last 8760 hours. HbA1C: No results for input(s): HGBA1C in the last 72 hours. CBG: No results for input(s): GLUCAP in the last  168 hours. Lipid Profile: No results for input(s): CHOL, HDL, LDLCALC, TRIG, CHOLHDL, LDLDIRECT in the last 72 hours. Thyroid Function Tests: No results for input(s): TSH, T4TOTAL, FREET4, T3FREE, THYROIDAB in the last 72 hours. Anemia Panel: No results for input(s): VITAMINB12, FOLATE, FERRITIN, TIBC, IRON, RETICCTPCT in the last 72 hours.  --------------------------------------------------------------------------------------------------------------- Urine analysis:    Component Value Date/Time   COLORURINE YELLOW 11/19/2020 1736   APPEARANCEUR CLEAR 11/19/2020 1736   LABSPEC 1.015 11/19/2020 1736   PHURINE 6.0 11/19/2020 1736   GLUCOSEU NEGATIVE 11/19/2020 1736   HGBUR NEGATIVE 11/19/2020 1736   BILIRUBINUR NEGATIVE 11/19/2020 1736   KETONESUR NEGATIVE 11/19/2020 1736   PROTEINUR >=300 (A) 11/19/2020 1736   UROBILINOGEN 4.0 (H) 07/23/2014 1356   NITRITE NEGATIVE 11/19/2020 1736   LEUKOCYTESUR NEGATIVE 11/19/2020 1736      Imaging Results:    DG Chest Portable 1 View  Result Date: 12/28/2020 CLINICAL DATA:  Shortness of breath EXAM: PORTABLE CHEST 1 VIEW COMPARISON:  11/19/2020 FINDINGS: Cardiomegaly with vascular congestion and diffuse interstitial opacity consistent with pulmonary edema. Small left greater than right pleural effusion. Basilar airspace disease on the left. Aortic atherosclerosis. No  pneumothorax IMPRESSION: 1. Cardiomegaly with vascular congestion and pulmonary edema. 2. Small left greater than right pleural effusions with left basilar atelectasis or pneumonia. Similar findings on 11/19/2020 comparison Electronically Signed   By: Donavan Foil M.D.   On: 12/28/2020 03:20    My personal review of EKG: Rhythm Afib Rate 77 /min, QTc 483 ,no Acute ST changes   Assessment & Plan:    Principal Problem:   CHF exacerbation (HCC) Active Problems:   Type 2 diabetes mellitus with stage 4 chronic kidney disease (HCC)   Hypertension   Hypothyroidism   Acute respiratory failure with hypoxia ([place])   1. Acute respiratory failure with hypoxia 1. Down to 84% with EMS 2. Baseline 3L [place], now requiring 6L [place] 3. Res panel Negative 4. CXR indicative of CHF 5. VBG pending 6. Patient also has a history of COPD with wheezing as well continue Solu-Medrol 2. CHF exacerbation 1. BNP 266 2. CXR with cardiomegaly and vascular congestion 3. Troponin leak at 28 4. Non adherent to medical regimen 5. 80mg  Lasix in the ED 6. Continue 80mg  Lasix BID, strict I and O 7. Daily weights 8. Fluid restriction 9. Continue asa, imdur, and statin 3. Hypokalemia 1. Potassium 3.4 2. IV K+ replace and recheck 4. HTN 1. Continue amlodipine 5. DMII 1. Continue long acting insulin, and sliding scale coverage 6. Hypothyroidism 1. Continue synthroid 7. Afib 1. Continue Eliquis 8. Toe injury 1. X-ray right foot   DVT Prophylaxis-   Eliquis- SCDs   AM Labs Ordered, also please review Full Orders  Family Communication: No family at bedside  Code Status: Full  Admission status: Inpatient :The appropriate admission status for this patient is INPATIENT. Inpatient status is judged to be reasonable and necessary in order to provide the required intensity of service to ensure the patient's safety. The patient's presenting symptoms, physical exam findings, and initial radiographic and laboratory data in  the context of their chronic comorbidities is felt to place them at high risk for further clinical deterioration. Furthermore, it is not anticipated that the patient will be medically stable for discharge from the hospital within 2 midnights of admission. The following factors support the admission status of inpatient.     The patient's presenting symptoms include shortness of breath The worrisome physical exam findings include peripheral edema and wheeze  The initial radiographic and laboratory data are worrisome because of elevated BNP, elevated troponin, CHF on CXR The chronic co-morbidities include DMII, CAD, hypothyroidism, afib       * I certify that at the point of admission it is my clinical judgment that the patient will require inpatient hospital care spanning beyond 2 midnights from the point of admission due to high intensity of service, high risk for further deterioration and high frequency of surveillance required.*  Time spent in minutes : [place]

## 2020-12-28 NOTE — ED Notes (Signed)
Patient alert and oriented , requesting to take off Bi-pap. Spoke with RT whom states to trial him on 4LPM oxygen. Patient sitting up in bed at this time consuming dinner. Tolerating N/C at this time.

## 2020-12-28 NOTE — Progress Notes (Signed)
Due to ABG results, I increased IPAP setting to 18 and increased the rate to 16.

## 2020-12-28 NOTE — ED Notes (Addendum)
EKG done and given to Dr Wyvonnia Dusky by this tech. Patient on 12 lead.

## 2020-12-28 NOTE — ED Notes (Signed)
Pt pulled up in bed   

## 2020-12-28 NOTE — ED Triage Notes (Signed)
Pt arrived from home via [facility] c/o increased SOB. Pt reports non-compliance with Lasix at home and is aware he has CHF and COPD. Pt arrived on 6L [place] with sats at 91%. Pt reports normally being on 3L chronic O2 at home. [facility] report administering albuterol treatment PTA. 18g IV established in right AC PTA.

## 2020-12-28 NOTE — ED Provider Notes (Signed)
[facility] Provider Note   CSN: [id number] Arrival date & time: 12/28/20  0209     History Chief Complaint  Patient presents with  . Shortness of Breath    [name] [name] is a 75 y.o. male.  Patient presents via EMS with shortness of breath worsening over the past day.  He states he had breathing problems for months and has missed several doses of his Lasix at home.  Per med records he was supposed to take Lasix "as needed".  He is normally on 3 to 4 L of oxygen at home ahead and increase it to 5 tonight.  EMS placed him on 6 L with saturations at 91%.  Patient states he has a nonproductive cough and congestion but no fever or chills.  Denies chest pain.  Denies abdominal pain.  Does have some leg swelling.  Uncertain of any change in his weight. EMS gave him a nebulizer and placed on 6 L of oxygen which was increased from his home 3 L.  Patient states compliance with his medications though misses his Lasix from time to time.  The history is provided by the patient and the EMS personnel.  Shortness of Breath Associated symptoms: cough   Associated symptoms: no chest pain, no fever, no headaches and no vomiting        Past Medical History:  Diagnosis Date  . Atrial fibrillation ([place])   . CAD (coronary artery disease)    DES to LAD June 2018  . CKD (chronic kidney disease) stage 3, GFR 30-59 ml/min (HCC)   . Essential hypertension   . History of pneumonia   . Hyperlipidemia   . Hypothyroidism   . LBBB (left bundle branch block)   . Morbid obesity ([place])   . Non Hodgkin's lymphoma ([place])    Status post XRT and chemotherapy  . NSTEMI (non-ST elevated myocardial infarction) [facility])    June 2018  . Peripheral neuropathy   . Pneumonia due to COVID-19 virus   . Sleep apnea   . Type 2 diabetes mellitus [facility])     Patient Active Problem List   Diagnosis Date Noted  . Acute on chronic diastolic HF (heart failure) ([place]) 10/06/2020  . Chronic respiratory failure  with hypoxia and hypercapnia ([place]) 09/10/2020  . Former smoker 08/10/2020  . COPD (chronic obstructive pulmonary disease) ([place]) 08/10/2020  . Acute on chronic diastolic CHF (congestive heart failure) ([place]) 08/05/2020  . Acute respiratory failure with hypoxia ([place]) 08/04/2020  . Acute exacerbation of CHF (congestive heart failure) ([place]) 07/19/2020  . AF (paroxysmal atrial fibrillation) ([place]) 07/19/2020  . COVID-19   . Acute on chronic respiratory failure with hypoxia ([place]) 06/25/2020  . Aspiration pneumonia ([place]) 06/24/2020  . Pneumonia due to COVID-19 virus 06/24/2020  . Confusion   . Small bowel obstruction ([place]) 01/29/2020  . SBO (small bowel obstruction) ([place]) 01/28/2020  . Primary osteoarthritis of left knee 05/24/2018  . Primary localized osteoarthritis of left knee 05/19/2018  . Acute on chronic systolic and diastolic heart failure, NYHA class 1 ([place]) 04/01/2017  . CAD (coronary artery disease) 04/01/2017  . DOE (dyspnea on exertion) 03/21/2017  . HTN (hypertension) 03/21/2017  . NSTEMI (non-ST elevated myocardial infarction) ([place]) 01/19/2017  . NSTEMI, initial episode of care ([place]) 01/19/2017  . Sepsis ([place]) 07/24/2016  . Elevated troponin I level 07/24/2016  . Fever 07/24/2016  . Lactic acidosis 07/24/2016  . Adjustment insomnia 05/18/2016  . Erectile dysfunction 12/19/2015  . Sleep apnea 09/30/2015  .  Osteoarthritis 09/30/2015  . Hypothyroidism 09/30/2015  . Hypogonadism in male 09/30/2015  . Diabetic neuropathy ([place]) 09/30/2015  . Chronic pain of left knee 09/30/2015  . Benign essential hypertension 09/30/2015  . Acute on chronic kidney failure ([place]) 05/14/2015  . Diarrhea 05/14/2015  . Dehydration 05/14/2015  . Hypokalemia 05/14/2015  . 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 Pelvic mass in male   . Varices, esophageal ([place])   . Colonic mass   . Abnormal CT scan, pelvis   . Bladder mass   . Elevated liver enzymes   . Elevated LFTs   . Abdominal pain  07/23/2014  . Chronic kidney disease Stage IV 07/23/2014  . Morbid obesity ([place]) 07/23/2014  . Type 2 diabetes mellitus with stage 4 chronic kidney disease ([place]) 07/23/2014  . Hypertension 07/23/2014  . S/P total knee replacement, 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    Past Surgical History:  Procedure Laterality Date  . CHOLECYSTECTOMY  1992  . COLONOSCOPY N/A 09/03/2014   SLF:six colon polyps removed/small internal hemorrhoids  . CORONARY STENT INTERVENTION N/A 01/21/2017   Procedure: Coronary Stent Intervention;  Surgeon: Nelva Bush, MD;  Location: [place] [facility];  Service: Cardiovascular;  Laterality: N/A;  . ESOPHAGOGASTRODUODENOSCOPY N/A 09/03/2014   SLF: mild gastritis/few gastric polyps  . LEFT HEART CATH AND CORONARY ANGIOGRAPHY N/A 01/20/2017   Procedure: Left Heart Cath and Coronary Angiography;  Surgeon: Jettie Booze, MD;  Location: [facility];  Service: Cardiovascular;  Laterality: N/A;  . TOTAL KNEE ARTHROPLASTY  11/10/2011   Procedure: TOTAL KNEE ARTHROPLASTY;  Surgeon: Mauri Pole, MD;  Location: [facility];  Service: Orthopedics;  Laterality: Right;  . TOTAL KNEE ARTHROPLASTY Left 05/24/2018   Procedure: LEFT TOTAL KNEE ARTHROPLASTY;  Surgeon: Melrose Nakayama, MD;  Location: [facility];  Service: Orthopedics;  Laterality: Left;       Family History  Problem Relation Age of Onset  . Cancer Mother        breast and lung  . Cancer Father        bladder  . Cancer Maternal Uncle        prostate  . Cancer Paternal Uncle        esophagus  . Colon cancer Neg Hx     Social History   Tobacco Use  . Smoking status: Former Smoker    Packs/day: 0.25    Years: 30.00    Pack years: 7.50    Types: Cigarettes    Quit date: 10/2019    Years since quitting: 1.2  . Smokeless tobacco: Never Used  Vaping Use  . Vaping Use: Never used  Substance Use Topics  . Alcohol use: No    Alcohol/week: 0.0 standard drinks  . Drug use: No    Home Medications Prior to Admission  medications   Medication Sig Start Date End Date Taking? Authorizing Provider  acetaminophen (TYLENOL) 325 MG tablet Take 2 tablets (650 mg total) by mouth every 6 (six) hours as needed for mild pain (or Fever >/= 101). 02/01/20   Roxan Hockey, MD  albuterol (VENTOLIN HFA) 108 (90 Base) MCG/ACT inhaler Inhale 1-2 puffs into the lungs every 6 (six) hours as needed for wheezing or shortness of breath. 01/10/19   Long, Wonda Olds, MD  amLODipine (NORVASC) 10 MG tablet Take 10 mg by mouth daily.    [provider]  apixaban (ELIQUIS) 5 MG TABS tablet Take 1 tablet (5 mg total) by mouth 2 (two) times daily. This is a dose change  12/05/20   Verta Ellen., NP  aspirin EC 81 MG tablet Take 81 mg by mouth daily. Swallow whole.    [provider]  fluticasone (FLONASE) 50 MCG/ACT nasal spray Place 2 sprays into both nostrils as needed for up to 14 days. 08/10/20 08/24/20  Murlean Iba, MD  furosemide (LASIX) 80 MG tablet Take 1 tablet (80 mg total) by mouth daily as needed for fluid or edema. 10/11/20   Manuella Ghazi, Pratik D, DO  insulin degludec (TRESIBA) 100 UNIT/ML FlexTouch Pen Inject 56 Units into the skin daily at 10 pm.    [provider]  ipratropium-albuterol (DUONEB) 0.5-2.5 (3) MG/3ML SOLN Inhale 3 mLs into the lungs every 4 (four) hours as needed (shortness of breath). 05/22/20   [provider]  isosorbide mononitrate (IMDUR) 30 MG 24 hr tablet Take 1 tablet (30 mg total) by mouth daily. 12/04/20   Satira Sark, MD  levocetirizine (XYZAL) 5 MG tablet Take 5 mg by mouth every evening.  08/27/17   [provider]  levothyroxine (SYNTHROID) 100 MCG tablet Take 100 mcg by mouth daily. (takes with 248mcg for a total of 343mcg) 01/08/20   [provider]  levothyroxine (SYNTHROID, LEVOTHROID) 200 MCG tablet Take 300 mcg by mouth daily before breakfast. (takes with 18mcg for a total of 344mcg)    [provider]  nitroGLYCERIN (NITROSTAT) 0.4  MG SL tablet Place 0.4 mg under the tongue every 5 (five) minutes as needed for chest pain.  12/27/17   [provider]  nystatin (MYCOSTATIN/NYSTOP) powder Apply topically 2 (two) times daily. Patient not taking: Reported on 11/19/2020 10/11/20   Heath Lark D, DO  OXYGEN Inhale 3 L into the lungs daily as needed.    [provider]  potassium chloride SA (KLOR-CON) 20 MEQ tablet Take 1 tablet (20 mEq total) by mouth 2 (two) times daily. 08/10/20   Johnson, Clanford L, MD  senna-docusate (SENOKOT-S) 8.6-50 MG tablet Take 1 tablet by mouth daily as needed for mild constipation. 02/01/20   [provider]  simvastatin (ZOCOR) 5 MG tablet Take 5 mg by mouth daily. 09/14/16   [provider]  SYMBICORT 160-4.5 MCG/ACT inhaler Inhale 2 puffs into the lungs daily. 12/22/19   [provider]    Allergies    Patient has no known allergies.  Review of Systems   Review of Systems  Constitutional: Negative for activity change, appetite change and fever.  HENT: Negative for congestion.   Respiratory: Positive for cough and shortness of breath.   Cardiovascular: Positive for leg swelling. Negative for chest pain.  Gastrointestinal: Negative for diarrhea, nausea and vomiting.  Genitourinary: Negative for dysuria and hematuria.  Musculoskeletal: Negative for arthralgias and myalgias.  Skin: Negative for wound.  Neurological: Negative for dizziness and headaches.   all other systems are negative except as noted in the HPI and PMH.   Physical Exam Updated Vital Signs BP (!) 153/85 (BP Location: Left Arm)   Pulse 84   Temp 97.8 F (36.6 C) (Oral)   Resp (!) 22   Ht 6\' 1"  (1.854 m)   Wt (!) 140 kg   SpO2 91%   BMI 40.72 kg/m   Physical Exam Vitals and nursing note reviewed.  Constitutional:      General: He is in acute distress.     Appearance: He is well-developed. He is obese. He is ill-appearing.     Comments: Dyspneic with conversation, diminished at  the bases  HENT:     Head: Normocephalic and atraumatic.     Mouth/Throat:     Pharynx: No oropharyngeal exudate.  Eyes:     Conjunctiva/sclera: Conjunctivae normal.     Pupils: Pupils are equal, round, and reactive to light.  Neck:     Comments: No meningismus. Cardiovascular:     Rate and Rhythm: Normal rate. Rhythm irregular.     Heart sounds: Normal heart sounds. No murmur heard.   Pulmonary:     Effort: Respiratory distress present.     Breath sounds: Wheezing and rales present.  Abdominal:     Palpations: Abdomen is soft.     Tenderness: There is no abdominal tenderness. There is no guarding or rebound.  Musculoskeletal:        General: No tenderness. Normal range of motion.     Cervical back: Normal range of motion and neck supple.     Right lower leg: Edema present.     Left lower leg: Edema present.     Comments: Chronic venous stasis changes.  Skin:    General: Skin is warm.  Neurological:     Mental Status: He is alert and oriented to person, place, and time.     Cranial Nerves: No cranial nerve deficit.     Motor: No abnormal muscle tone.     Coordination: Coordination normal.     Comments: No ataxia on finger to nose bilaterally. No pronator drift. 5/5 strength throughout. CN 2-12 intact.Equal grip strength. Sensation intact.   Psychiatric:        Behavior: Behavior normal.     ED Results / Procedures / Treatments   Labs (all labs ordered are listed, but only abnormal results are displayed) Labs Reviewed  CBC WITH DIFFERENTIAL/PLATELET - Abnormal; Notable for the following components:      Result Value   RDW 16.7 (*)    Platelets 127 (*)    All other components within normal limits  COMPREHENSIVE METABOLIC PANEL - Abnormal; Notable for the following components:   Potassium 3.4 (*)    Glucose, Bld 128 (*)    BUN 39 (*)    Creatinine, Ser 2.89 (*)    AST 13 (*)    GFR, Estimated 22 (*)    All other components within normal limits  BRAIN NATRIURETIC  PEPTIDE - Abnormal; Notable for the following components:   B Natriuretic Peptide 266.0 (*)    All other components within normal limits  BLOOD GAS, ARTERIAL - Abnormal; Notable for the following components:   pH, Arterial 7.299 (*)    pCO2 arterial 58.2 (*)    pO2, Arterial 80.9 (*)    Allens test (pass/fail) NOT INDICATED (*)    All other components within normal limits  TROPONIN I (HIGH SENSITIVITY) - Abnormal; Notable for the following components:   Troponin I (High Sensitivity) 28 (*)    All other components within normal limits  RESP PANEL BY RT-PCR (FLU A&B, COVID) ARPGX2  MAGNESIUM  COMPREHENSIVE METABOLIC PANEL  CBC  TROPONIN I (HIGH SENSITIVITY)    EKG EKG Interpretation  Date/Time:  Saturday Dec 28 2020 02:29:20 EDT Ventricular Rate:  77 PR Interval:    QRS Duration: 155 QT Interval:  426 QTC Calculation: 483 R Axis:   -86 Text Interpretation: Atrial fibrillation Nonspecific IVCD with LAD Inferior infarct, old Anterior infarct, old No significant change was found Confirmed by Ezequiel Essex [phone]) on 12/28/2020 2:37:49 AM   Radiology DG Chest Portable 1 View  Result Date: 12/28/2020  CLINICAL DATA:  Shortness of breath EXAM: PORTABLE CHEST 1 VIEW COMPARISON:  11/19/2020 FINDINGS: Cardiomegaly with vascular congestion and diffuse interstitial opacity consistent with pulmonary edema. Small left greater than right pleural effusion. Basilar airspace disease on the left. Aortic atherosclerosis. No pneumothorax IMPRESSION: 1. Cardiomegaly with vascular congestion and pulmonary edema. 2. Small left greater than right pleural effusions with left basilar atelectasis or pneumonia. Similar findings on 11/19/2020 comparison Electronically Signed   By: Donavan Foil M.D.   On: 12/28/2020 03:20    Procedures .Critical Care Performed by: Ezequiel Essex, MD Authorized by: Ezequiel Essex, MD   Critical care provider statement:    Critical care time (minutes):  45    Critical care was necessary to treat or prevent imminent or life-threatening deterioration of the following conditions:  Respiratory failure and cardiac failure   Critical care was time spent personally by me on the following activities:  Discussions with consultants, evaluation of patient's response to treatment, examination of patient, ordering and performing treatments and interventions, ordering and review of laboratory studies, ordering and review of radiographic studies, pulse oximetry, re-evaluation of patient's condition, obtaining history from patient or surrogate and review of old charts     Medications Ordered in ED Medications  furosemide (LASIX) injection 80 mg (has no administration in time range)  methylPREDNISolone sodium succinate (SOLU-MEDROL) 125 mg/2 mL injection 80 mg (has no administration in time range)    ED Course  I have reviewed the triage vital signs and the nursing notes.  Pertinent labs & imaging results that were available during my care of the patient were reviewed by me and considered in my medical decision making (see chart for details).    MDM Rules/Calculators/A&P                         Shortness of breath with COPD and CHF.  Known diastolic dysfunction. Lungs diminished on exam. EKG unchanged atrial fibrillation with nonspecific IVCD.  Patient given Lasix, nebulizers and steroids  Chest x-ray shows edema.  Oxygen increased to 6 L by high flow nasal cannula.  Labs show stable creatinine of 2.9.  COVID is negative.  Troponin minimally elevated.  EKG is unchanged from previous.   Breathing has improved on nasal cannula and will hold off on BiPAP at this time.  Will check VBG.  Given his worsening oxygen requirements and pulmonary edema on x-ray we will plan admission for IV diuresis as well as treatment of his COPD exacerbation.  Discussed with Dr. Clearence Ped.   Final Clinical Impression(s) / ED Diagnoses Final diagnoses:  Acute on chronic  diastolic congestive heart failure [facility])    Rx / DC Orders ED Discharge Orders    None       Jazman Reuter, Annie Main, MD 12/28/20 [phone]

## 2020-12-28 NOTE — ED Notes (Signed)
RT notified of sats of 88%. States to increase to 6LPM and she will come to ED to assess and place on humidification. Patient alert and denies being in any resp. Distress.

## 2020-12-28 NOTE — ED Notes (Signed)
Started patient on nebulizer at which time patient became very labored with breathing with saturation dropping quickly. Even with cannula returned at 12 liters patient still laboring , states he can't breath. Have placed patient on BiPAP 14/6 f 14 80% oxygen until CHF resolves. Patient has only urinated very little at time of note.

## 2020-12-29 DIAGNOSIS — J9601 Acute respiratory failure with hypoxia: Secondary | ICD-10-CM | POA: Diagnosis not present

## 2020-12-29 DIAGNOSIS — I509 Heart failure, unspecified: Secondary | ICD-10-CM | POA: Diagnosis not present

## 2020-12-29 DIAGNOSIS — I5033 Acute on chronic diastolic (congestive) heart failure: Secondary | ICD-10-CM | POA: Diagnosis not present

## 2020-12-29 DIAGNOSIS — I10 Essential (primary) hypertension: Secondary | ICD-10-CM | POA: Diagnosis not present

## 2020-12-29 LAB — CBC
HCT: 44.9 % (ref 39.0–52.0)
Hemoglobin: 13.6 g/dL (ref 13.0–17.0)
MCH: 28.1 pg (ref 26.0–34.0)
MCHC: 30.3 g/dL (ref 30.0–36.0)
MCV: 92.8 fL (ref 80.0–100.0)
Platelets: 127 10*3/uL — ABNORMAL LOW (ref 150–400)
RBC: 4.84 MIL/uL (ref 4.22–5.81)
RDW: 16.7 % — ABNORMAL HIGH (ref 11.5–15.5)
WBC: 5.6 10*3/uL (ref 4.0–10.5)
nRBC: 0 % (ref 0.0–0.2)

## 2020-12-29 LAB — BASIC METABOLIC PANEL
Anion gap: 10 (ref 5–15)
BUN: 51 mg/dL — ABNORMAL HIGH (ref 8–23)
CO2: 26 mmol/L (ref 22–32)
Calcium: 8.4 mg/dL — ABNORMAL LOW (ref 8.9–10.3)
Chloride: 101 mmol/L (ref 98–111)
Creatinine, Ser: 3.1 mg/dL — ABNORMAL HIGH (ref 0.61–1.24)
GFR, Estimated: 20 mL/min — ABNORMAL LOW (ref >60)
Glucose, Bld: 182 mg/dL — ABNORMAL HIGH (ref 70–99)
Potassium: 4.5 mmol/L (ref 3.5–5.1)
Sodium: 137 mmol/L (ref 135–145)

## 2020-12-29 LAB — GLUCOSE, CAPILLARY
Glucose-Capillary: 167 mg/dL — ABNORMAL HIGH (ref 70–99)
Glucose-Capillary: 166 mg/dL — ABNORMAL HIGH (ref 70–99)
Glucose-Capillary: 152 mg/dL — ABNORMAL HIGH (ref 70–99)
Glucose-Capillary: 219 mg/dL — ABNORMAL HIGH (ref 70–99)

## 2020-12-29 LAB — BRAIN NATRIURETIC PEPTIDE: B Natriuretic Peptide: 241 pg/mL — ABNORMAL HIGH (ref 0.0–100.0)

## 2020-12-29 LAB — MRSA PCR SCREENING: MRSA by PCR: NEGATIVE

## 2020-12-29 MED ORDER — FUROSEMIDE 10 MG/ML IJ SOLN
40.0000 mg | Freq: Two times a day (BID) | INTRAMUSCULAR | Status: DC
  Administered 2020-12-30: 40 mg via INTRAVENOUS
  Filled 2020-12-29: qty 4

## 2020-12-29 MED ORDER — FUROSEMIDE 10 MG/ML IJ SOLN: 40.0000 mg | Freq: Two times a day (BID) | INTRAMUSCULAR | Status: DC

## 2020-12-29 NOTE — TOC Initial Note (Addendum)
Transition of Care [facility]) - Initial/Assessment Note    Patient Details  Name: [name] [name] MRN: [id number] Date of Birth: [birth year]  [facility]) CM/SW Contact:    Natasha Bence, LCSW Phone Number: 12/29/2020, 4:20 PM  Clinical Narrative:                 Patient is a 75 year old male admitted for CHF exacerbation. CSW observed patient's high readmission risk score. CSW conducted risk assessment. CSW also conducted initial assessment. Patient's daughter reported that patient has a Hx with HH, but felt that the patient needed SNF. Patient's daughter reported that he is mostly able to perform his ADL's with little assistance when he is not struggling to breath at baseline. Patient reported that he is now agreeable to SNF and preferred [facility]. Patient is also agreeable to SNF's with in [place]. CSW completed FL2 and faxed referral. Patient reported that he does not follow a heart healthy diet, but is willing to now begin following a heart healthy diet. TOC to follow.  Expected Discharge Plan: Skilled Nursing Facility Barriers to Discharge: Continued Medical Work up   Patient Goals and CMS Choice Patient states their goals for this hospitalization and ongoing recovery are:: Rehab with SNF CMS Medicare.gov Compare Post Acute Care list provided to:: Patient Choice offered to / list presented to : Patient  Expected Discharge Plan and Services Expected Discharge Plan: [place]       Living arrangements for the past 2 months: Single Family Home                                      Prior Living Arrangements/Services Living arrangements for the past 2 months: Single Family Home Lives with:: Self Patient language and need for interpreter reviewed:: Yes Do you feel safe going back to the place where you live?: Yes      Need for Family Participation in Patient Care: Yes (Comment) Care giver support system in place?: Yes (comment)   Criminal  Activity/Legal Involvement Pertinent to Current Situation/Hospitalization: No - Comment as needed  Activities of Daily Living Home Assistive Devices/Equipment: External Monitoring Devices ADL Screening (condition at time of admission) Patient's cognitive ability adequate to safely complete daily activities?: Yes Is the patient deaf or have difficulty hearing?: No Does the patient have difficulty seeing, even when wearing glasses/contacts?: No Does the patient have difficulty concentrating, remembering, or making decisions?: No Patient able to express need for assistance with ADLs?: Yes Does the patient have difficulty dressing or bathing?: No Independently performs ADLs?: Yes (appropriate for developmental age) Does the patient have difficulty walking or climbing stairs?: Yes Weakness of Legs: Both Weakness of Arms/Hands: None  Permission Sought/Granted Permission sought to share information with : Family Supports Permission granted to share information with : Yes, Verbal Permission Granted  Share Information with NAME: Jessup,Jennifer  Permission granted to share info w AGENCY: Local SNF  Permission granted to share info w Relationship: Daughter  Permission granted to share info w Contact Information: [phone]  Emotional Assessment Appearance:: Appears stated age   Affect (typically observed): Accepting,Adaptable Orientation: : Oriented to Self,Oriented to Situation,Oriented to Place,Oriented to  Time Alcohol / Substance Use: Not Applicable Psych Involvement: No (comment)  Admission diagnosis:  CHF (congestive heart failure) (HCC) [I50.9] Toe injury [S99.929A] CHF exacerbation (HCC) [I50.9] Acute on chronic diastolic congestive heart failure ([place]) [I50.33] Patient Active  Problem List   Diagnosis Date Noted  . CHF exacerbation ([place]) 12/28/2020  . CHF (congestive heart failure) ([place]) 12/28/2020  . Acute on chronic diastolic HF (heart failure) ([place]) 10/06/2020  . Chronic  respiratory failure with hypoxia and hypercapnia ([place]) 09/10/2020  . Former smoker 08/10/2020  . COPD (chronic obstructive pulmonary disease) ([place]) 08/10/2020  . Acute on chronic diastolic CHF (congestive heart failure) ([place]) 08/05/2020  . Acute respiratory failure with hypoxia ([place]) 08/04/2020  . Acute exacerbation of CHF (congestive heart failure) ([place]) 07/19/2020  . AF (paroxysmal atrial fibrillation) ([place]) 07/19/2020  . COVID-19   . Acute on chronic respiratory failure with hypoxia ([place]) 06/25/2020  . Aspiration pneumonia ([place]) 06/24/2020  . Pneumonia due to COVID-19 virus 06/24/2020  . Confusion   . Small bowel obstruction ([place]) 01/29/2020  . SBO (small bowel obstruction) ([place]) 01/28/2020  . Primary osteoarthritis of left knee 05/24/2018  . Primary localized osteoarthritis of left knee 05/19/2018  . Acute on chronic systolic and diastolic heart failure, NYHA class 1 ([place]) 04/01/2017  . CAD (coronary artery disease) 04/01/2017  . DOE (dyspnea on exertion) 03/21/2017  . HTN (hypertension) 03/21/2017  . NSTEMI (non-ST elevated myocardial infarction) ([place]) 01/19/2017  . NSTEMI, initial episode of care ([place]) 01/19/2017  . Sepsis ([place]) 07/24/2016  . Elevated troponin I level 07/24/2016  . Fever 07/24/2016  . Lactic acidosis 07/24/2016  . Adjustment insomnia 05/18/2016  . Erectile dysfunction 12/19/2015  . Sleep apnea 09/30/2015  . Osteoarthritis 09/30/2015  . Hypothyroidism 09/30/2015  . Hypogonadism in male 09/30/2015  . Diabetic neuropathy ([place]) 09/30/2015  . Chronic pain of left knee 09/30/2015  . Benign essential hypertension 09/30/2015  . Acute on chronic kidney failure ([place]) 05/14/2015  . Diarrhea 05/14/2015  . Dehydration 05/14/2015  . Hypokalemia 05/14/2015  . 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 Pelvic mass in male   . Varices, esophageal ([place])   . Colonic mass   . Abnormal CT scan, pelvis   . Bladder mass   . Elevated liver enzymes   . Elevated LFTs   .  Abdominal pain 07/23/2014  . Chronic kidney disease Stage IV 07/23/2014  . Morbid obesity ([place]) 07/23/2014  . Type 2 diabetes mellitus with stage 4 chronic kidney disease ([place]) 07/23/2014  . Hypertension 07/23/2014  . S/P total knee replacement, 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   PCP:  Nickola Major, MD Pharmacy:   [facility], [place] [address] Phone: [phone] Fax: [phone]     Social Determinants of Health (SDOH) Interventions    Readmission Risk Interventions Readmission Risk Prevention Plan 12/29/2020 10/07/2020 08/05/2020  Transportation Screening Complete Complete Complete  Medication Review Press photographer) Complete Complete Complete  PCP or Specialist appointment within 3-5 days of discharge Complete - -  HRI or Home Care Consult Complete Complete Complete  SW Recovery Care/Counseling Consult Complete Complete Complete  Palliative Care Screening Not Applicable Not Applicable Not Applicable  Skilled Nursing Facility Complete Not Applicable Not Applicable  Some recent data might be hidden

## 2020-12-29 NOTE — NC FL2 (Signed)
[place] LEVEL OF CARE SCREENING TOOL     IDENTIFICATION  Patient Name: [name] [name] Birthdate: 07/08/1946 Sex: male Admission Date (Current Location): 12/28/2020  [facility] and [place] Number:  [employer] and Address:  [facility] [address] [place]      Provider Number: [id number]  Attending Physician Name and Address:  Deatra James, MD  Relative Name and Phone Number:  Ledora Bottcher (Daughter)   [phone]    Current Level of Care: SNF Recommended Level of Care: [place] Prior Approval Number:    Date Approved/Denied: 11/14/11 PASRR Number: [id number]  Discharge Plan: SNF    Current Diagnoses: Patient Active Problem List   Diagnosis Date Noted  . CHF exacerbation ([place]) 12/28/2020  . CHF (congestive heart failure) ([place]) 12/28/2020  . Acute on chronic diastolic HF (heart failure) ([place]) 10/06/2020  . Chronic respiratory failure with hypoxia and hypercapnia ([place]) 09/10/2020  . Former smoker 08/10/2020  . COPD (chronic obstructive pulmonary disease) ([place]) 08/10/2020  . Acute on chronic diastolic CHF (congestive heart failure) ([place]) 08/05/2020  . Acute respiratory failure with hypoxia ([place]) 08/04/2020  . Acute exacerbation of CHF (congestive heart failure) ([place]) 07/19/2020  . AF (paroxysmal atrial fibrillation) ([place]) 07/19/2020  . COVID-19   . Acute on chronic respiratory failure with hypoxia ([place]) 06/25/2020  . Aspiration pneumonia ([place]) 06/24/2020  . Pneumonia due to COVID-19 virus 06/24/2020  . Confusion   . Small bowel obstruction ([place]) 01/29/2020  . SBO (small bowel obstruction) ([place]) 01/28/2020  . Primary osteoarthritis of left knee 05/24/2018  . Primary localized osteoarthritis of left knee 05/19/2018  . Acute on chronic systolic and diastolic heart failure, NYHA class 1 ([place]) 04/01/2017  . CAD (coronary artery disease) 04/01/2017  . DOE (dyspnea on exertion) 03/21/2017  . HTN  (hypertension) 03/21/2017  . NSTEMI (non-ST elevated myocardial infarction) ([place]) 01/19/2017  . NSTEMI, initial episode of care ([place]) 01/19/2017  . Sepsis ([place]) 07/24/2016  . Elevated troponin I level 07/24/2016  . Fever 07/24/2016  . Lactic acidosis 07/24/2016  . Adjustment insomnia 05/18/2016  . Erectile dysfunction 12/19/2015  . Sleep apnea 09/30/2015  . Osteoarthritis 09/30/2015  . Hypothyroidism 09/30/2015  . Hypogonadism in male 09/30/2015  . Diabetic neuropathy ([place]) 09/30/2015  . Chronic pain of left knee 09/30/2015  . Benign essential hypertension 09/30/2015  . Acute on chronic kidney failure ([place]) 05/14/2015  . Diarrhea 05/14/2015  . Dehydration 05/14/2015  . Hypokalemia 05/14/2015  . 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 Pelvic mass in male   . Varices, esophageal ([place])   . Colonic mass   . Abnormal CT scan, pelvis   . Bladder mass   . Elevated liver enzymes   . Elevated LFTs   . Abdominal pain 07/23/2014  . Chronic kidney disease Stage IV 07/23/2014  . Morbid obesity ([place]) 07/23/2014  . Type 2 diabetes mellitus with stage 4 chronic kidney disease ([place]) 07/23/2014  . Hypertension 07/23/2014  . S/P total knee replacement, 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    Orientation RESPIRATION BLADDER Height & Weight     Self,Time,Situation,Place  O2 (6L) Continent Weight: (!) 318 lb 12.6 oz (144.6 kg) Height:  6\' 1"  (185.4 cm)  BEHAVIORAL SYMPTOMS/MOOD NEUROLOGICAL BOWEL NUTRITION STATUS      Continent Diet  AMBULATORY STATUS COMMUNICATION OF NEEDS Skin   Extensive Assist Verbally Normal  Personal Care Assistance Level of Assistance  Bathing,Feeding,Dressing Bathing Assistance: Maximum assistance Feeding assistance: Limited assistance Dressing Assistance: Maximum assistance     Functional Limitations Info  Sight,Hearing,Speech Sight Info: Adequate Hearing Info: Adequate Speech Info: Adequate    SPECIAL CARE FACTORS FREQUENCY  PT (By licensed  PT)     PT Frequency: 5x              Contractures Contractures Info: Not present    Additional Factors Info  Code Status,Allergies Code Status Info: Full Allergies Info: N/A           Current Medications (12/29/2020):  This is the current hospital active medication list Current Facility-Administered Medications  Medication Dose Route Frequency Provider Last Rate Last Admin  . acetaminophen (TYLENOL) tablet 650 mg  650 mg Oral Q6H PRN Zierle-Ghosh, Asia B, DO       Or  . acetaminophen (TYLENOL) suppository 650 mg  650 mg Rectal Q6H PRN Zierle-Ghosh, Asia B, DO      . amLODipine (NORVASC) tablet 10 mg  10 mg Oral Daily Zierle-Ghosh, Asia B, DO   10 mg at 12/29/20 0820  . apixaban (ELIQUIS) tablet 5 mg  5 mg Oral BID Zierle-Ghosh, Asia B, DO   5 mg at 12/29/20 0819  . aspirin EC tablet 81 mg  81 mg Oral Daily Zierle-Ghosh, Asia B, DO   81 mg at 12/29/20 0820  . budesonide (PULMICORT) nebulizer solution 0.25 mg  0.25 mg Nebulization BID Shahmehdi, Seyed A, MD   0.25 mg at 12/29/20 0823  . chlorhexidine (PERIDEX) 0.12 % solution 15 mL  15 mL Mouth Rinse BID Zierle-Ghosh, Asia B, DO   15 mL at 12/29/20 0821  . Chlorhexidine Gluconate Cloth 2 % PADS 6 each  6 each Topical Daily Zierle-Ghosh, Asia B, DO   6 each at 12/29/20 1300  . [START ON 12/30/2020] furosemide (LASIX) injection 40 mg  40 mg Intravenous Q12H Shahmehdi, Seyed A, MD      . insulin aspart (novoLOG) injection 0-15 Units  0-15 Units Subcutaneous TID WC Zierle-Ghosh, Asia B, DO   3 Units at 12/29/20 1155  . insulin aspart (novoLOG) injection 0-5 Units  0-5 Units Subcutaneous QHS Zierle-Ghosh, Asia B, DO      . insulin detemir (LEVEMIR) injection 45 Units  45 Units Subcutaneous QHS Zierle-Ghosh, Asia B, DO   45 Units at 12/28/20 2313  . ipratropium-albuterol (DUONEB) 0.5-2.5 (3) MG/3ML nebulizer solution 3 mL  3 mL Inhalation Q4H Zierle-Ghosh, Asia B, DO   3 mL at 12/29/20 1508  . isosorbide mononitrate (IMDUR) 24 hr tablet  30 mg  30 mg Oral Daily Zierle-Ghosh, Asia B, DO   30 mg at 12/29/20 0820  . levothyroxine (SYNTHROID) tablet 300 mcg  300 mcg Oral Q0600 Zierle-Ghosh, Asia B, DO   300 mcg at 12/29/20 0543  . MEDLINE mouth rinse  15 mL Mouth Rinse q12n4p Zierle-Ghosh, Asia B, DO      . methylPREDNISolone sodium succinate (SOLU-MEDROL) 125 mg/2 mL injection 60 mg  60 mg Intravenous Q6H Shahmehdi, Seyed A, MD   60 mg at 12/29/20 0824  . ondansetron (ZOFRAN) tablet 4 mg  4 mg Oral Q6H PRN Zierle-Ghosh, Asia B, DO       Or  . ondansetron (ZOFRAN) injection 4 mg  4 mg Intravenous Q6H PRN Zierle-Ghosh, Asia B, DO      . oxyCODONE (Oxy IR/ROXICODONE) immediate release tablet 5 mg  5 mg Oral Q4H PRN Zierle-Ghosh, Asia B, DO      .  simvastatin (ZOCOR) tablet 5 mg  5 mg Oral Daily Zierle-Ghosh, Asia B, DO   5 mg at 12/28/20 1748     Discharge Medications: Please see discharge summary for a list of discharge medications.  Relevant Imaging Results:  Relevant Lab Results:   Additional Information Pt SSN: [id number]  Natasha Bence, LCSW

## 2020-12-29 NOTE — Progress Notes (Signed)
PROGRESS NOTE    Patient: [name] [name]                            PCP: Nickola Major, MD                    DOB: 1946-03-11            DOA: 12/28/2020 FAO:[id number]             DOS: 12/29/2020, 12:02 PM   LOS: 1 day   Date of Service: The patient was seen and examined on 12/29/2020  Subjective:   The patient was seen and examined this morning, much more awake alert, following commands Has been weaned off BiPAP, currently on 6 L of oxygen 4%  Brief Narrative:    [name] [name]  is a 75 y.o. male, with history of type 2 diabetes mellitus, morbid obesity, coronary artery disease, non-Hodgkin's lymphoma, hypothyroidism, hyperlipidemia, essential hypertension, CKD stage III, atrial fibrillation, and more presents the ED with chief complaint of dyspnea.  O2 dependent 3 L at baseline..  Progressively worsening shortness of breath over 2 days   He admits to a cough that is productive of yellow sputum occasionally, noted edema in his legs, missed some home medications    He is vaccinated for COVID.  He is full code but does not want long-term life support.  In the ED  patient was afebrile, heart rate 67-84, respiratory rate 18-22, blood pressure 150/94, satting at 99% on 6 L No leukocytosis, chemistry reveals a creatinine that is at patient's baseline around 2.89 BNP is elevated at 48 Trope was minimally elevated at 28, repeat pending Respiratory panel is negative Chest x-ray shows cardiomegaly with vascular congestion and pulmonary edema with pleural effusions greater on the left than the right and left basilar atelectasis -80 mg of Lasix given in the ED -Solu-Medrol 80 mg given in the ED EKG shows A. fib heart rate 77, QTc 483 Last echo was in December 2021 showed ejection fraction of 50 to 55% with grade 2 diastolic dysfunction   Assessment & Plan:   Principal Problem:   CHF exacerbation ([place]) Active Problems:   Type 2 diabetes mellitus with stage 4 chronic kidney disease  (HCC)   Hypertension   Hypothyroidism   Acute respiratory failure with hypoxia (HCC)   CHF (congestive heart failure) (HCC)  respiratory failure with hypoxia ([place])   1. Acute respiratory failure with hypoxia 1. Based on presentation likely CHF exacerbation, with underlying COPD 2. Was weaned off BiPAP, currently on 6 L of oxygen, satting 94% 3. At baseline 3 L of oxygen (on admission was satting 83% on 3 L) 4. On BiPAP with O2 rate of 12, FiO2 of 60%, ABG 7.24/PCO2 65.3/PO2 of 74.3 5. Res panel Negative 6. CXR indicative of CHF 7. Continue Solu-Medrol, DuoNeb bronchodilator treatment as needed 8.  2. CHF exacerbation 1. Monitoring daily weight, labs proBNP 2. CXR with cardiomegaly and vascular congestion 3. Mildly elevated troponin, consistent with ischemic demand recycling troponins 28, 31 4. Continue evaluation for 80 mg twice daily... Holding today due to elevated creatinine to 3.1 5. We will monitor daily weight, I's and O's 6. Fluid restriction 7. We will continue home medications of  asa, imdur, and statin 3. Hypokalemia 1. Potassium 3.4, 3.7, 4.5 today 2. IV K+ replace and recheck 3. Monitoring 4. HTN 1. Continue amlodipine 2. Mildly elevated, stable 5. DMII 1. Checking  CBG QA CHS, SSI coverage 2. Continue long-acting insulin 3. Anticipating hyperglycemia due to steroids 6. Hypothyroidism 1. Continue synthroid 2. Stable 7. Afib 1. Continue Eliquis 2. Remained stable 8. Toe injury 1. X-ray right foot -reviewed negative for any abnormality or fracture 2. As needed analgesics  9.  Acute on chronic kidney disease stage IIIa  1.  Possible cardiorenal syndrome BUN 39, 40, creatinine 2.89, 2.82 >>> 3.10  2.  We will holding high intensity Lasix, will initiate tomorrow with low-dose  3.  Consulting nephrology for further evaluation recommendation possible developing cardiorenal syndrome     ------------------------------------------------------------------------------------------------------------------------------------- Nutritional status:  The patient's BMI is: Body mass index is 42.06 kg/m. I agree with the assessment and plan as outlined -------------------------------------------------------------------------------------------------------------------------------------- Cultures; None  Antimicrobials: None    Consultants: card/nephrology   ------------------------------------------------------------------------------------------------------------------------------------------------  DVT prophylaxis:  SCD/Compression stockings and OI:ZTIWPYK Code Status:   Code Status: Full Code  Family Communication: No family member present at bedside- attempt will be made to update daily The above findings and plan of care has been discussed with patient (and family)  in detail,  they expressed understanding and agreement of above. -Advance care planning has been discussed.   Admission status:   Status is: Inpatient  Remains inpatient appropriate because:Hemodynamically unstable and Inpatient level of care appropriate due to severity of illness   Dispo: The patient is from: Home              Anticipated d/c is to: Home              Patient currently is not medically stable to d/c.  As patient remains in respiratory failure, requiring  BiPAP   Difficult to place patient No      Level of care: Stepdown   Procedures:   No admission procedures for hospital encounter.     Antimicrobials:  Anti-infectives (From admission, onward)   None       Medication:  . amLODipine  10 mg Oral Daily  . apixaban  5 mg Oral BID  . aspirin EC  81 mg Oral Daily  . budesonide (PULMICORT) nebulizer solution  0.25 mg Nebulization BID  . chlorhexidine  15 mL Mouth Rinse BID  . Chlorhexidine Gluconate Cloth  6 each Topical Daily  . [START ON 12/30/2020] furosemide  40 mg  Intravenous Q12H  . insulin aspart  0-15 Units Subcutaneous TID WC  . insulin aspart  0-5 Units Subcutaneous QHS  . insulin detemir  45 Units Subcutaneous QHS  . ipratropium-albuterol  3 mL Inhalation Q4H  . isosorbide mononitrate  30 mg Oral Daily  . levothyroxine  300 mcg Oral Q0600  . mouth rinse  15 mL Mouth Rinse q12n4p  . methylPREDNISolone (SOLU-MEDROL) injection  60 mg Intravenous Q6H  . simvastatin  5 mg Oral Daily    acetaminophen **OR** acetaminophen, ondansetron **OR** ondansetron (ZOFRAN) IV, oxyCODONE   Objective:   Vitals:   12/29/20 0745 12/29/20 0900 12/29/20 1000 12/29/20 1100  BP:  (!) 143/65 138/82 (!) 143/74  Pulse:  86 80 80  Resp:  (!) 23 (!) 22 10  Temp: 98.1 F (36.7 C)     TempSrc: Oral     SpO2:  95% 93% 94%  Weight:      Height:        Intake/Output Summary (Last 24 hours) at 12/29/2020 1202 Last data filed at 12/29/2020 0600 Gross per 24 hour  Intake 840 ml  Output 400 ml  Net 440 ml   Filed  Weights   12/28/20 0235 12/29/20 0500  Weight: (!) 140 kg (!) 144.6 kg     Examination:     Physical Exam:   General:  Alert, oriented, cooperative, no distress;   HEENT:  Normocephalic, PERRL, otherwise with in Normal limits   Neuro:  CNII-XII intact. , normal motor and sensation, reflexes intact   Lungs:    Diffuse wheezing, bilateral mild to moderate crackles,  Mild labored breathing, shortness of breath   Cardio:    S1/S2, RRR, No murmure, No Rubs or Gallops   Abdomen:   Soft, non-tender, bowel sounds active all four quadrants,  no guarding or peritoneal signs.  Muscular skeletal:  Limited exam - in bed, able to move all 4 extremities, Normal strength,  2+ pulses,  symmetric, +2 pitting edema  Skin:  Dry, warm to touch, negative for any Rashes,  Wounds: Please see nursing documentation         ------------------------------------------------------------------------------------------------------------------------------------------     LABs:  CBC Latest Ref Rng & Units 12/29/2020 12/28/2020 12/28/2020  WBC 4.0 - 10.5 K/uL 5.6 6.0 5.9  Hemoglobin 13.0 - 17.0 g/dL 13.6 15.7 14.3  Hematocrit 39.0 - 52.0 % 44.9 51.9 46.4  Platelets 150 - 400 K/uL 127(L) 126(L) 127(L)   CMP Latest Ref Rng & Units 12/29/2020 12/28/2020 12/28/2020  Glucose 70 - 99 mg/dL 182(H) 128(H) 128(H)  BUN 8 - 23 mg/dL 51(H) 40(H) 39(H)  Creatinine 0.61 - 1.24 mg/dL 3.10(H) 2.82(H) 2.89(H)  Sodium 135 - 145 mmol/L 137 139 139  Potassium 3.5 - 5.1 mmol/L 4.5 3.7 3.4(L)  Chloride 98 - 111 mmol/L 101 101 102  CO2 22 - 32 mmol/L 26 29 30   Calcium 8.9 - 10.3 mg/dL 8.4(L) 9.0 8.9  Total Protein 6.5 - 8.1 g/dL - 8.0 7.5  Total Bilirubin 0.3 - 1.2 mg/dL - 1.0 1.0  Alkaline Phos 38 - 126 U/L - 98 91  AST 15 - 41 U/L - 13(L) 13(L)  ALT 0 - 44 U/L - 8 8       Micro Results Recent Results (from the past 240 hour(s))  Resp Panel by RT-PCR (Flu A&B, Covid) Nasopharyngeal Swab     Status: None   Collection Time: 12/28/20  2:49 AM   Specimen: Nasopharyngeal Swab; Nasopharyngeal(NP) swabs in vial transport medium  Result Value Ref Range Status   SARS Coronavirus 2 by RT PCR NEGATIVE NEGATIVE Final    Comment: (NOTE) SARS-CoV-2 target nucleic acids are NOT DETECTED.  The SARS-CoV-2 RNA is generally detectable in upper respiratory specimens during the acute phase of infection. The lowest concentration of SARS-CoV-2 viral copies this assay can detect is 138 copies/mL. A negative result does not preclude SARS-Cov-2 infection and should not be used as the sole basis for treatment or other patient management decisions. A negative result may occur with  improper specimen collection/handling, submission of specimen other than nasopharyngeal swab, presence of viral mutation(s) within the areas targeted by this assay, and inadequate number of viral copies(<138 copies/mL). A negative result must be combined with clinical observations, patient history, and  epidemiological information. The expected result is Negative.  Fact Sheet for Patients:  EntrepreneurPulse.com.au  Fact Sheet for Healthcare Providers:  IncredibleEmployment.be  This test is no t yet approved or cleared by the [place] FDA and  has been authorized for detection and/or diagnosis of SARS-CoV-2 by FDA under an Emergency Use Authorization (EUA). This EUA will remain  in effect (meaning this test can be used) for the duration of  the COVID-19 declaration under Section 564(b)(1) of the Act, 21 U.S.C.section 360bbb-3(b)(1), unless the authorization is terminated  or revoked sooner.       Influenza A by PCR NEGATIVE NEGATIVE Final   Influenza B by PCR NEGATIVE NEGATIVE Final    Comment: (NOTE) The Xpert Xpress SARS-CoV-2/FLU/RSV plus assay is intended as an aid in the diagnosis of influenza from Nasopharyngeal swab specimens and should not be used as a sole basis for treatment. Nasal washings and aspirates are unacceptable for Xpert Xpress SARS-CoV-2/FLU/RSV testing.  Fact Sheet for Patients: EntrepreneurPulse.com.au  Fact Sheet for Healthcare Providers: IncredibleEmployment.be  This test is not yet approved or cleared by the [place] FDA and has been authorized for detection and/or diagnosis of SARS-CoV-2 by FDA under an Emergency Use Authorization (EUA). This EUA will remain in effect (meaning this test can be used) for the duration of the COVID-19 declaration under Section 564(b)(1) of the Act, 21 U.S.C. section 360bbb-3(b)(1), unless the authorization is terminated or revoked.  Performed at [facility], [address] [place], [place] [address]   MRSA PCR Screening     Status: None   Collection Time: 12/28/20 10:00 PM   Specimen: Nasal Mucosa; Nasopharyngeal  Result Value Ref Range Status   MRSA by PCR NEGATIVE NEGATIVE Final    Comment:        The GeneXpert MRSA Assay  (FDA approved for NASAL specimens only), is one component of a comprehensive MRSA colonization surveillance program. It is not intended to diagnose MRSA infection nor to guide or monitor treatment for MRSA infections. Performed at [facility], [address], [place], [place] [address]     Radiology Reports DG Chest Portable 1 View  Result Date: 12/28/2020 CLINICAL DATA:  Shortness of breath EXAM: PORTABLE CHEST 1 VIEW COMPARISON:  11/19/2020 FINDINGS: Cardiomegaly with vascular congestion and diffuse interstitial opacity consistent with pulmonary edema. Small left greater than right pleural effusion. Basilar airspace disease on the left. Aortic atherosclerosis. No pneumothorax IMPRESSION: 1. Cardiomegaly with vascular congestion and pulmonary edema. 2. Small left greater than right pleural effusions with left basilar atelectasis or pneumonia. Similar findings on 11/19/2020 comparison Electronically Signed   By: Donavan Foil M.D.   On: 12/28/2020 03:20   DG Toe 3rd Right  Result Date: 12/28/2020 CLINICAL DATA:  Right third toe injury EXAM: RIGHT THIRD TOE COMPARISON:  None. FINDINGS: There is no evidence of fracture or dislocation. There is no evidence of arthropathy or other focal bone abnormality. Soft tissues are unremarkable. IMPRESSION: Negative. Electronically Signed   By: Monte Fantasia M.D.   On: 12/28/2020 05:39   ECHOCARDIOGRAM COMPLETE  Result Date: 12/28/2020    ECHOCARDIOGRAM REPORT   Patient Name:   GEORG ANG Date of Exam: 12/28/2020 Medical Rec #:  [id number]     Height:       73.0 in Accession #:    [id number]    Weight:       308.6 lb Date of Birth:  24-Jun-1946     BSA:          2.587 m Patient Age:    89 years      BP:           146/83 mmHg Patient Gender: M             HR:           75 bpm. Exam Location:  [facility] Procedure: 2D Echo, Cardiac Doppler and Color Doppler Indications:    CHF  History:        Patient has prior history of Echocardiogram examinations, most                  recent 07/20/2020. CAD, Arrythmias:LBBB and Atrial Fibrillation;                 Risk Factors:Diabetes, Dyslipidemia, Hypertension and Current                 Smoker.  Sonographer:    Maudry Mayhew MHA, RDMS, RVT, RDCS Referring Phys: [id number] ASIA B [place]  Sonographer Comments: Suboptimal subcostal window. Image acquisition challenging due to patient body habitus, Image acquisition challenging due to respiratory motion and on CPAP. IMPRESSIONS  1. Left ventricular ejection fraction, by estimation, is 55 to 60%. The left ventricle has normal function. The left ventricle has no regional wall motion abnormalities. The left ventricular internal cavity size was moderately dilated. Left ventricular diastolic function could not be evaluated.  2. Right ventricular systolic function is normal. The right ventricular size is mildly enlarged. There is moderately elevated pulmonary artery systolic pressure.  3. Left atrial size was mildly dilated.  4. Right atrial size was moderately dilated.  5. The mitral valve is normal in structure. Trivial mitral valve regurgitation. No evidence of mitral stenosis.  6. The aortic valve is tricuspid. Aortic valve regurgitation is not visualized. Mild aortic valve sclerosis is present, with no evidence of aortic valve stenosis.  7. The inferior vena cava is dilated in size with >50% respiratory variability, suggesting right atrial pressure of 8 mmHg. FINDINGS  Left Ventricle: Left ventricular ejection fraction, by estimation, is 55 to 60%. The left ventricle has normal function. The left ventricle has no regional wall motion abnormalities. Definity contrast agent was given IV to delineate the left ventricular  endocardial borders. The left ventricular internal cavity size was moderately dilated. There is no left ventricular hypertrophy. Left ventricular diastolic function could not be evaluated due to atrial fibrillation. Left ventricular diastolic function could not  be evaluated. Right Ventricle: The right ventricular size is mildly enlarged.Right ventricular systolic function is normal. There is moderately elevated pulmonary artery systolic pressure. The tricuspid regurgitant velocity is 3.54 m/s, and with an assumed right atrial pressure of 8 mmHg, the estimated right ventricular systolic pressure is 32.6 mmHg. Left Atrium: Left atrial size was mildly dilated. Right Atrium: Right atrial size was moderately dilated. Pericardium: There is no evidence of pericardial effusion. Mitral Valve: The mitral valve is normal in structure. Trivial mitral valve regurgitation. No evidence of mitral valve stenosis. Tricuspid Valve: The tricuspid valve is normal in structure. Tricuspid valve regurgitation is trivial. No evidence of tricuspid stenosis. Aortic Valve: The aortic valve is tricuspid. Aortic valve regurgitation is not visualized. Mild aortic valve sclerosis is present, with no evidence of aortic valve stenosis. Aortic valve mean gradient measures 5.0 mmHg. Aortic valve peak gradient measures 10.2 mmHg. Aortic valve area, by VTI measures 1.90 cm. Pulmonic Valve: The pulmonic valve was not well visualized. Pulmonic valve regurgitation is not visualized. No evidence of pulmonic stenosis. Aorta: The aortic root is normal in size and structure. Venous: The inferior vena cava is dilated in size with greater than 50% respiratory variability, suggesting right atrial pressure of 8 mmHg.   LEFT VENTRICLE PLAX 2D LVIDd:         6.30 cm LVIDs:         4.80 cm LV PW:         0.70 cm LV IVS:  0.90 cm LVOT diam:     2.00 cm LV SV:         60 LV SV Index:   23 LVOT Area:     3.14 cm  LV Volumes (MOD) LV vol d, MOD A2C: 186.0 ml LV vol d, MOD A4C: 153.0 ml LV vol s, MOD A2C: 71.4 ml LV vol s, MOD A4C: 104.0 ml LV SV MOD A2C:     114.6 ml LV SV MOD A4C:     153.0 ml LV SV MOD BP:      88.7 ml RIGHT VENTRICLE TAPSE (M-mode): 1.7 cm LEFT ATRIUM              Index       RIGHT ATRIUM            Index LA diam:        4.10 cm  1.58 cm/m  RA Area:     32.20 cm LA Vol (A2C):   134.0 ml 51.79 ml/m RA Volume:   137.00 ml 52.95 ml/m LA Vol (A4C):   58.3 ml  22.53 ml/m LA Biplane Vol: 90.1 ml  34.82 ml/m  AORTIC VALVE AV Area (Vmax):    1.84 cm AV Area (Vmean):   1.73 cm AV Area (VTI):     1.90 cm AV Vmax:           160.00 cm/s AV Vmean:          107.000 cm/s AV VTI:            0.315 m AV Peak Grad:      10.2 mmHg AV Mean Grad:      5.0 mmHg LVOT Vmax:         93.60 cm/s LVOT Vmean:        58.800 cm/s LVOT VTI:          0.191 m LVOT/AV VTI ratio: 0.61  AORTA Ao Root diam: 3.40 cm TRICUSPID VALVE TR Peak grad:   50.1 mmHg TR Vmax:        354.00 cm/s  SHUNTS Systemic VTI:  0.19 m Systemic Diam: 2.00 cm Kirk Ruths MD Electronically signed by Kirk Ruths MD Signature Date/Time: 12/28/2020/12:18:15 PM    Final     SIGNED: Deatra James, MD, FHM. [facility],  Pager (please use [email or web address] to page/text) Please use Epic Secure Chat for non-urgent communication (7AM-7PM)  If 7PM-7AM, please contact night-coverage [email or web address], 12/29/2020, 12:02 PM

## 2020-12-29 NOTE — Plan of Care (Signed)

## 2020-12-30 DIAGNOSIS — I5033 Acute on chronic diastolic (congestive) heart failure: Secondary | ICD-10-CM | POA: Diagnosis not present

## 2020-12-30 DIAGNOSIS — I10 Essential (primary) hypertension: Secondary | ICD-10-CM | POA: Diagnosis not present

## 2020-12-30 DIAGNOSIS — J9601 Acute respiratory failure with hypoxia: Secondary | ICD-10-CM | POA: Diagnosis not present

## 2020-12-30 DIAGNOSIS — I509 Heart failure, unspecified: Secondary | ICD-10-CM | POA: Diagnosis not present

## 2020-12-30 LAB — CBC
HCT: 45.1 % (ref 39.0–52.0)
Hemoglobin: 14.2 g/dL (ref 13.0–17.0)
MCH: 28.6 pg (ref 26.0–34.0)
MCHC: 31.5 g/dL (ref 30.0–36.0)
MCV: 90.7 fL (ref 80.0–100.0)
Platelets: 134 10*3/uL — ABNORMAL LOW (ref 150–400)
RBC: 4.97 MIL/uL (ref 4.22–5.81)
RDW: 16.8 % — ABNORMAL HIGH (ref 11.5–15.5)
WBC: 6.1 10*3/uL (ref 4.0–10.5)
nRBC: 0 % (ref 0.0–0.2)

## 2020-12-30 LAB — BASIC METABOLIC PANEL
Anion gap: 11 (ref 5–15)
BUN: 65 mg/dL — ABNORMAL HIGH (ref 8–23)
CO2: 25 mmol/L (ref 22–32)
Calcium: 8.6 mg/dL — ABNORMAL LOW (ref 8.9–10.3)
Chloride: 102 mmol/L (ref 98–111)
Creatinine, Ser: 3.27 mg/dL — ABNORMAL HIGH (ref 0.61–1.24)
GFR, Estimated: 19 mL/min — ABNORMAL LOW (ref >60)
Glucose, Bld: 146 mg/dL — ABNORMAL HIGH (ref 70–99)
Potassium: 4.3 mmol/L (ref 3.5–5.1)
Sodium: 138 mmol/L (ref 135–145)

## 2020-12-30 LAB — BRAIN NATRIURETIC PEPTIDE: B Natriuretic Peptide: 180 pg/mL — ABNORMAL HIGH (ref 0.0–100.0)

## 2020-12-30 LAB — GLUCOSE, CAPILLARY
Glucose-Capillary: 138 mg/dL — ABNORMAL HIGH (ref 70–99)
Glucose-Capillary: 199 mg/dL — ABNORMAL HIGH (ref 70–99)
Glucose-Capillary: 148 mg/dL — ABNORMAL HIGH (ref 70–99)
Glucose-Capillary: 187 mg/dL — ABNORMAL HIGH (ref 70–99)

## 2020-12-30 MED ORDER — FUROSEMIDE 10 MG/ML IJ SOLN
40.0000 mg | Freq: Two times a day (BID) | INTRAMUSCULAR | Status: DC
  Administered 2020-12-30: 40 mg via INTRAVENOUS
  Filled 2020-12-30: qty 4

## 2020-12-30 MED ORDER — SIMVASTATIN 10 MG PO TABS
5.0000 mg | ORAL_TABLET | Freq: Every day | ORAL | Status: DC
  Administered 2020-12-30 – 2021-01-01 (×3): 5 mg via ORAL
  Filled 2020-12-30 (×3): qty 1

## 2020-12-30 NOTE — Consult Note (Addendum)
[facility] Consultation Note  Requesting MD: Dr Roger Shelter, Erling Conte Reason for consult: AKI on CKD  HPI:  [name] [name] is a 75 y.o. male with history of hypertension, HLD, DM, obesity, A. fib, non-Hodgkin's lymphoma, status post radiation and chemotherapy Per chart, peripheral neuropathy, CKD follows with nephrologist in [facility], COPD with chronic hypoxia presented with worsening shortness of breath she has a consultation for AKI on CKD. Per patient, he follows with a nephrologist at [facility] in [facility].  He was told to have creatinine level around 3 due to diabetes.  He also has COPD with chronic hypoxia and uses around 3 L of oxygen. On admission, the creatinine level was 2.89.  Chest x-ray with cardiomegaly with vascular congestion and pulmonary edema.  COVID and respiratory viral panel negative.  BNP only 266.  He was treated with multiple doses of IV Lasix with uptrending of creatinine level to 3.27 today.  He also received Solu-Medrol and bronchodilators for his COPD.  The Lasix was held today because of elevated BUN and creatinine level. Today, he is requiring around 3 L of oxygen which is his baseline.  He denies any urinary complaint.  The urinalysis done in 11/2018 with proteinuria without any microscopic hematuria.  The kidney ultrasound done in 10/2020 without hydronephrosis and unremarkable studies. From his home medication list he is not on ACE inhibitor or ARB.  He is on Lasix 80 mg as needed for edema.  He denies use of NSAIDs.  No urinary complaint.  He has external urinary catheter with clear urine.Urine output is documented only 300 cc.  Creatinine, Ser  Date/Time Value Ref Range Status  12/30/2020 05:29 AM 3.27 (H) 0.61 - 1.24 mg/dL Final  12/29/2020 05:19 AM 3.10 (H) 0.61 - 1.24 mg/dL Final  12/28/2020 05:50 AM 2.82 (H) 0.61 - 1.24 mg/dL Final  12/28/2020 02:36 AM 2.89 (H) 0.61 - 1.24 mg/dL Final  11/19/2020 02:00 PM 2.25 (H) 0.61 -  1.24 mg/dL Final  10/11/2020 05:27 AM 3.12 (H) 0.61 - 1.24 mg/dL Final  10/10/2020 05:22 AM 3.62 (H) 0.61 - 1.24 mg/dL Final  10/09/2020 05:57 AM 3.15 (H) 0.61 - 1.24 mg/dL Final  10/08/2020 05:33 AM 3.08 (H) 0.61 - 1.24 mg/dL Final  10/07/2020 05:39 AM 2.95 (H) 0.61 - 1.24 mg/dL Final   PMHx:   Past Medical History:  Diagnosis Date  . Atrial fibrillation ([place])   . CAD (coronary artery disease)    DES to LAD June 2018  . CKD (chronic kidney disease) stage 3, GFR 30-59 ml/min (HCC)   . Essential hypertension   . History of pneumonia   . Hyperlipidemia   . Hypothyroidism   . LBBB (left bundle branch block)   . Morbid obesity ([place])   . Non Hodgkin's lymphoma ([place])    Status post XRT and chemotherapy  . NSTEMI (non-ST elevated myocardial infarction) [facility])    June 2018  . Peripheral neuropathy   . Pneumonia due to COVID-19 virus   . Sleep apnea   . Type 2 diabetes mellitus ([place])     Past Surgical History:  Procedure Laterality Date  . CHOLECYSTECTOMY  1992  . COLONOSCOPY N/A 09/03/2014   SLF:six colon polyps removed/small internal hemorrhoids  . CORONARY STENT INTERVENTION N/A 01/21/2017   Procedure: Coronary Stent Intervention;  Surgeon: Nelva Bush, MD;  Location: [place] [facility];  Service: Cardiovascular;  Laterality: N/A;  . ESOPHAGOGASTRODUODENOSCOPY N/A 09/03/2014   SLF: mild gastritis/few gastric polyps  . LEFT  HEART CATH AND CORONARY ANGIOGRAPHY N/A 01/20/2017   Procedure: Left Heart Cath and Coronary Angiography;  Surgeon: Jettie Booze, MD;  Location: [facility];  Service: Cardiovascular;  Laterality: N/A;  . TOTAL KNEE ARTHROPLASTY  11/10/2011   Procedure: TOTAL KNEE ARTHROPLASTY;  Surgeon: Mauri Pole, MD;  Location: [facility];  Service: Orthopedics;  Laterality: Right;  . TOTAL KNEE ARTHROPLASTY Left 05/24/2018   Procedure: LEFT TOTAL KNEE ARTHROPLASTY;  Surgeon: Melrose Nakayama, MD;  Location: [facility];  Service: Orthopedics;  Laterality: Left;     Family Hx:  Family History  Problem Relation Age of Onset  . Cancer Mother        breast and lung  . Cancer Father        bladder  . Cancer Maternal Uncle        prostate  . Cancer Paternal Uncle        esophagus  . Colon cancer Neg Hx     Social History:  reports that he quit smoking about 14 months ago. His smoking use included cigarettes. He has a 7.50 pack-year smoking history. He has never used smokeless tobacco. He reports that he does not drink alcohol and does not use drugs.  Allergies: No Known Allergies  Medications: Prior to Admission medications   Medication Sig Start Date End Date Taking? Authorizing Provider  acetaminophen (TYLENOL) 325 MG tablet Take 2 tablets (650 mg total) by mouth every 6 (six) hours as needed for mild pain (or Fever >/= 101). 02/01/20   Roxan Hockey, MD  albuterol (VENTOLIN HFA) 108 (90 Base) MCG/ACT inhaler Inhale 1-2 puffs into the lungs every 6 (six) hours as needed for wheezing or shortness of breath. 01/10/19   Long, Wonda Olds, MD  amLODipine (NORVASC) 10 MG tablet Take 10 mg by mouth daily.    [provider]  apixaban (ELIQUIS) 5 MG TABS tablet Take 1 tablet (5 mg total) by mouth 2 (two) times daily. This is a dose change 12/05/20   Verta Ellen., NP  aspirin EC 81 MG tablet Take 81 mg by mouth daily. Swallow whole.    [provider]  bisoprolol (ZEBETA) 5 MG tablet Take by mouth. 11/26/20   [provider]  fluticasone (FLONASE) 50 MCG/ACT nasal spray Place 2 sprays into both nostrils as needed for up to 14 days. 08/10/20 08/24/20  Murlean Iba, MD  furosemide (LASIX) 80 MG tablet Take 1 tablet (80 mg total) by mouth daily as needed for fluid or edema. 10/11/20   Manuella Ghazi, Pratik D, DO  insulin degludec (TRESIBA) 100 UNIT/ML FlexTouch Pen Inject 56 Units into the skin daily at 10 pm.    [provider]  ipratropium-albuterol (DUONEB) 0.5-2.5 (3) MG/3ML SOLN Inhale 3 mLs into the lungs every 4 (four)  hours as needed (shortness of breath). 05/22/20   [provider]  isosorbide mononitrate (IMDUR) 30 MG 24 hr tablet Take 1 tablet (30 mg total) by mouth daily. 12/04/20   Satira Sark, MD  levocetirizine (XYZAL) 5 MG tablet Take 5 mg by mouth every evening.  08/27/17   [provider]  levothyroxine (SYNTHROID) 100 MCG tablet Take 100 mcg by mouth daily. (takes with 271mcg for a total of 342mcg) 01/08/20   [provider]  levothyroxine (SYNTHROID, LEVOTHROID) 200 MCG tablet Take 300 mcg by mouth daily before breakfast. (takes with 141mcg for a total of 346mcg)    [provider]  nitroGLYCERIN (NITROSTAT) 0.4 MG SL  tablet Place 0.4 mg under the tongue every 5 (five) minutes as needed for chest pain.  12/27/17   [provider]  nystatin (MYCOSTATIN/NYSTOP) powder Apply topically 2 (two) times daily. Patient not taking: Reported on 11/19/2020 10/11/20   Heath Lark D, DO  OXYGEN Inhale 3 L into the lungs daily as needed.    [provider]  potassium chloride SA (KLOR-CON) 20 MEQ tablet Take 1 tablet (20 mEq total) by mouth 2 (two) times daily. 08/10/20   Johnson, Clanford L, MD  senna-docusate (SENOKOT-S) 8.6-50 MG tablet Take 1 tablet by mouth daily as needed for mild constipation. 02/01/20   [provider]  simvastatin (ZOCOR) 5 MG tablet Take 5 mg by mouth daily. 09/14/16   [provider]  SYMBICORT 160-4.5 MCG/ACT inhaler Inhale 2 puffs into the lungs daily. 12/22/19   [provider]    I have reviewed the patient's current medications.  Labs:  Results for orders placed or performed during the hospital encounter of 12/28/20 (from the past 48 hour(s))  CBG monitoring, ED     Status: Abnormal   Collection Time: 12/28/20 12:13 PM  Result Value Ref Range   Glucose-Capillary 147 (H) 70 - 99 mg/dL    Comment: Glucose reference range applies only to samples taken after fasting for at least 8 hours.  CBG monitoring, ED      Status: Abnormal   Collection Time: 12/28/20  5:11 PM  Result Value Ref Range   Glucose-Capillary 125 (H) 70 - 99 mg/dL    Comment: Glucose reference range applies only to samples taken after fasting for at least 8 hours.  MRSA PCR Screening     Status: None   Collection Time: 12/28/20 10:00 PM   Specimen: Nasal Mucosa; Nasopharyngeal  Result Value Ref Range   MRSA by PCR NEGATIVE NEGATIVE    Comment:        The GeneXpert MRSA Assay (FDA approved for NASAL specimens only), is one component of a comprehensive MRSA colonization surveillance program. It is not intended to diagnose MRSA infection nor to guide or monitor treatment for MRSA infections. Performed at [facility], [address], [place], [place] [address]   Glucose, capillary     Status: Abnormal   Collection Time: 12/28/20 10:18 PM  Result Value Ref Range   Glucose-Capillary 145 (H) 70 - 99 mg/dL    Comment: Glucose reference range applies only to samples taken after fasting for at least 8 hours.  CBC     Status: Abnormal   Collection Time: 12/29/20  5:19 AM  Result Value Ref Range   WBC 5.6 4.0 - 10.5 K/uL   RBC 4.84 4.22 - 5.81 MIL/uL   Hemoglobin 13.6 13.0 - 17.0 g/dL   HCT 44.9 39.0 - 52.0 %   MCV 92.8 80.0 - 100.0 fL   MCH 28.1 26.0 - 34.0 pg   MCHC 30.3 30.0 - 36.0 g/dL   RDW 16.7 (H) 11.5 - 15.5 %   Platelets 127 (L) 150 - 400 K/uL   nRBC 0.0 0.0 - 0.2 %    Comment: Performed at [facility], [address] [place], [place] [address]  Basic metabolic panel     Status: Abnormal   Collection Time: 12/29/20  5:19 AM  Result Value Ref Range   Sodium 137 135 - 145 mmol/L   Potassium 4.5 3.5 - 5.1 mmol/L    Comment: DELTA CHECK NOTED PREVIOUS RESULTS REVIEWED 12/29/2020 KAY    Chloride 101 98 -  111 mmol/L   CO2 26 22 - 32 mmol/L   Glucose, Bld 182 (H) 70 - 99 mg/dL    Comment: Glucose reference range applies only to samples taken after fasting for at least 8 hours.   BUN 51 (H) 8 - 23 mg/dL    Creatinine, Ser 3.10 (H) 0.61 - 1.24 mg/dL   Calcium 8.4 (L) 8.9 - 10.3 mg/dL   GFR, Estimated 20 (L) >60 mL/min    Comment: (NOTE) Calculated using the CKD-EPI Creatinine Equation (2021)    Anion gap 10 5 - 15    Comment: Performed at [facility], [address], [place], [place] [address]  Brain natriuretic peptide     Status: Abnormal   Collection Time: 12/29/20  5:19 AM  Result Value Ref Range   B Natriuretic Peptide 241.0 (H) 0.0 - 100.0 pg/mL    Comment: Performed at [facility], [address] [place], [place] [address]  Glucose, capillary     Status: Abnormal   Collection Time: 12/29/20  7:49 AM  Result Value Ref Range   Glucose-Capillary 167 (H) 70 - 99 mg/dL    Comment: Glucose reference range applies only to samples taken after fasting for at least 8 hours.  Glucose, capillary     Status: Abnormal   Collection Time: 12/29/20 10:55 AM  Result Value Ref Range   Glucose-Capillary 166 (H) 70 - 99 mg/dL    Comment: Glucose reference range applies only to samples taken after fasting for at least 8 hours.  Glucose, capillary     Status: Abnormal   Collection Time: 12/29/20  4:37 PM  Result Value Ref Range   Glucose-Capillary 152 (H) 70 - 99 mg/dL    Comment: Glucose reference range applies only to samples taken after fasting for at least 8 hours.  Glucose, capillary     Status: Abnormal   Collection Time: 12/29/20  9:06 PM  Result Value Ref Range   Glucose-Capillary 219 (H) 70 - 99 mg/dL    Comment: Glucose reference range applies only to samples taken after fasting for at least 8 hours.   Comment 1 Notify RN    Comment 2 Document in Chart   CBC     Status: Abnormal   Collection Time: 12/30/20  5:29 AM  Result Value Ref Range   WBC 6.1 4.0 - 10.5 K/uL   RBC 4.97 4.22 - 5.81 MIL/uL   Hemoglobin 14.2 13.0 - 17.0 g/dL   HCT 45.1 39.0 - 52.0 %   MCV 90.7 80.0 - 100.0 fL   MCH 28.6 26.0 - 34.0 pg   MCHC 31.5 30.0 - 36.0 g/dL   RDW 16.8 (H) 11.5 - 15.5 %   Platelets 134  (L) 150 - 400 K/uL   nRBC 0.0 0.0 - 0.2 %    Comment: Performed at [facility], [address] [place], [place] [address]  Basic metabolic panel     Status: Abnormal   Collection Time: 12/30/20  5:29 AM  Result Value Ref Range   Sodium 138 135 - 145 mmol/L   Potassium 4.3 3.5 - 5.1 mmol/L   Chloride 102 98 - 111 mmol/L   CO2 25 22 - 32 mmol/L   Glucose, Bld 146 (H) 70 - 99 mg/dL    Comment: Glucose reference range applies only to samples taken after fasting for at least 8 hours.   BUN 65 (H) 8 - 23 mg/dL   Creatinine, Ser 3.27 (H) 0.61 - 1.24 mg/dL   Calcium  8.6 (L) 8.9 - 10.3 mg/dL   GFR, Estimated 19 (L) >60 mL/min    Comment: (NOTE) Calculated using the CKD-EPI Creatinine Equation (2021)    Anion gap 11 5 - 15    Comment: Performed at [facility] [address] [place], [place] [address]  Brain natriuretic peptide     Status: Abnormal   Collection Time: 12/30/20  5:29 AM  Result Value Ref Range   B Natriuretic Peptide 180.0 (H) 0.0 - 100.0 pg/mL    Comment: Performed at [facility], [address], [place], [place] [address]  Glucose, capillary     Status: Abnormal   Collection Time: 12/30/20  7:23 AM  Result Value Ref Range   Glucose-Capillary 138 (H) 70 - 99 mg/dL    Comment: Glucose reference range applies only to samples taken after fasting for at least 8 hours.  Glucose, capillary     Status: Abnormal   Collection Time: 12/30/20 11:10 AM  Result Value Ref Range   Glucose-Capillary 199 (H) 70 - 99 mg/dL    Comment: Glucose reference range applies only to samples taken after fasting for at least 8 hours.     ROS:  Pertinent items noted in HPI and remainder of comprehensive ROS otherwise negative.  Physical Exam: Vitals:   12/30/20 0749 12/30/20 1108  BP:    Pulse:    Resp:    Temp:  97.6 F (36.4 C)  SpO2: 95%      General exam: Appears calm and comfortable  Respiratory system: Clear to auscultation. Respiratory effort normal. No wheezing or  crackle Cardiovascular system: S1 & S2 heard, RRR.  Trace pedal edema noted, seems chronic. Gastrointestinal system: Abdomen is nondistended, soft and nontender. Normal bowel sounds heard. Central nervous system: Alert and oriented. No focal neurological deficits. Extremities: Symmetric 5 x 5 power. Skin: Chronic venous stasis changes in lower extremities skin. Psychiatry: Judgement and insight appear normal. Mood & affect appropriate.   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  He presented with shortness of breath which was probably combination of mild CHF and COPD.  He received multiple doses of IV Lasix with uptrending BUN and creatinine level today.  He does not look overtly volume up on exam.  I agree with holding diuretics today and repeat lab in the morning. I will check urinalysis, bladder scan.  #Shortness of breath/chronic hypoxic respiratory failure/COPD: He is currently using 3 L of oxygen which is his home dose.  Received multiple doses of IV diuretics for CHF as discussed above.  Continue bronchodilators, steroid.  I recommend to repeat chest x-ray.  #Heart failure with preserved EF: Complicated by CKD.  Only minimal elevation of BNP.  Diuretics as discussed above.  #Hypertension: Resume home antihypertensive medication including amlodipine, isosorbide.  Monitor BP.  Daily evaluation for diuretics need.  #Atrial fibrillation: On Eliquis.  Monitor heart rate.  Thank you for the consult, we will follow with you.   Chi Woodham Tanna Furry 12/30/2020, 11:24 AM  [facility].

## 2020-12-30 NOTE — Progress Notes (Signed)
PROGRESS NOTE    Patient: [name] [name]                            PCP: Nickola Major, MD                    DOB: [birth year]            DOA: 12/28/2020 VFI:[id number]             DOS: 12/30/2020, 12:33 PM   LOS: 2 days   Date of Service: The patient was seen and examined on 12/30/2020  Subjective:   The patient was seen and examined this morning, he has been successfully weaned off BiPAP to 6 L of oxygen now on 3 L oxygen this morning satting greater 92%. Patient's BUN/creatinine noted to be still rising.  He states that he is still voiding well Also stating that he is willing to go to his SNF this time. Still complaining of shortness of breath with any exertion... Denies any chest pain  Brief Narrative:    Demba Nigh  is a 75 y.o. male, with history of type 2 diabetes mellitus, morbid obesity, coronary artery disease, non-Hodgkin's lymphoma, hypothyroidism, hyperlipidemia, essential hypertension, CKD stage III, atrial fibrillation, and more presents the ED with chief complaint of dyspnea.  O2 dependent 3 L at baseline..  Progressively worsening shortness of breath over 2 days   He admits to a cough that is productive of yellow sputum occasionally, noted edema in his legs, missed some home medications    He is vaccinated for COVID.  He is full code but does not want long-term life support.  In the ED  patient was afebrile, heart rate 67-84, respiratory rate 18-22, blood pressure 150/94, satting at 99% on 6 L No leukocytosis, chemistry reveals a creatinine that is at patient's baseline around 2.89 BNP is elevated at 33 Trope was minimally elevated at 28, repeat pending Respiratory panel is negative Chest x-ray shows cardiomegaly with vascular congestion and pulmonary edema with pleural effusions greater on the left than the right and left basilar atelectasis -80 mg of Lasix given in the ED -Solu-Medrol 80 mg given in the ED EKG shows A. fib heart rate 77, QTc 483 Last echo  was in December 2021 showed ejection fraction of 50 to 55% with grade 2 diastolic dysfunction   Assessment & Plan:   Principal Problem:   CHF exacerbation ([place]) Active Problems:   Type 2 diabetes mellitus with stage 4 chronic kidney disease (HCC)   Hypertension   Hypothyroidism   Acute respiratory failure with hypoxia (HCC)   CHF (congestive heart failure) (HCC)  respiratory failure with hypoxia ([place])   1. Acute respiratory failure with hypoxia 1. Based on presentation likely CHF exacerbation, with underlying COPD 2. With successfully weaned off BiPAP, to 6 L oxygen this morning 3 L of oxygen, satting 95% 3. Remain tachypneic which gets worse with exertion 4. At baseline 3 L of oxygen (on admission was satting 83% on 3 L) 5. On admission: was on BiPAP with O2 rate of 12, FiO2 of 60%, ABG 7.24/PCO2 65.3/PO2 of 74.3 6. Res panel Negative 7. CXR indicative of CHF 8. Continue Solu-Medrol, DuoNeb bronchodilator breathing treatments scheduled 9. Tapering down Solu-Medrol, holding Lasix due to elevated BUN/creatinine  2. CHF exacerbation 1. Monitoring daily weight, labs proBNP 2. CXR with cardiomegaly and vascular congestion 3. Mildly elevated troponin, consistent with ischemic demand recycling troponins  28, 31 4. We will continue to hold Lasix--as worsening BUN/creatinine 65/3.27 respectively 5. We will monitor daily weight, I's and O's 6. Fluid restriction 7. We will continue home medications of  asa, imdur, and statin 3. Hypokalemia 1. Potassium 3.4, 3.7, 4.5, 4.3 today 2. IV K+ replace and recheck 3. Monitoring 4. HTN 1. Continue amlodipine 2. Mildly elevated, stable 5. DMII 1. Checking CBG QA CHS, SSI coverage 2. Continue long-acting insulin (Levemir) 3. Anticipating hyperglycemia due to steroids 6. Hypothyroidism 1. Continue synthroid 2. Stable 7. Afib 1. Continue Eliquis 2. Remained stable 8. Toe injury 1. X-ray right foot -reviewed negative for any abnormality  or fracture 2. As needed analgesics  9.  Acute on chronic kidney disease stage IIIa  1.  Possible cardiorenal syndrome BUN 39, 40, creatinine 2.89, 2.82 >>> 3.10>> , 3.27  2.  We will holding high intensity Lasix, will initiate tomorrow with low-dose  3.  Consulting nephrology for further evaluation recommendation possible developing cardiorenal  Syndrome >>>> appreciate their input    ------------------------------------------------------------------------------------------------------------------------------------- Nutritional status:  The patient's BMI is: Body mass index is 42.32 kg/m. I agree with the assessment and plan as outlined -------------------------------------------------------------------------------------------------------------------------------------- Cultures; None  Antimicrobials: None    Consultants: card/nephrology   ------------------------------------------------------------------------------------------------------------------------------------------------  DVT prophylaxis:  SCD/Compression stockings and DJ:SHFWYOV Code Status:   Code Status: Full Code  Family Communication: Patient is alert and oriented x4, discussed with patient in detail The above findings and plan of care has been discussed with patient (and family)  in detail,  they expressed understanding and agreement of above. -Advance care planning has been discussed.   Admission status:   Status is: Inpatient  Remains inpatient appropriate because:Hemodynamically unstable and Inpatient level of care appropriate due to severity of illness   Dispo: The patient is from: Home              Anticipated d/c is to: SNF in 1 to 2 days              Patient currently is not medically stable to d/c.  As patient remains in respiratory failure, requiring  breathing treatments, as needed BiPAP   Difficult to place patient No      Level of care: Stepdown   Procedures:   No admission  procedures for hospital encounter.     Antimicrobials:  Anti-infectives (From admission, onward)   None       Medication:  . amLODipine  10 mg Oral Daily  . apixaban  5 mg Oral BID  . aspirin EC  81 mg Oral Daily  . budesonide (PULMICORT) nebulizer solution  0.25 mg Nebulization BID  . chlorhexidine  15 mL Mouth Rinse BID  . Chlorhexidine Gluconate Cloth  6 each Topical Daily  . [START ON 12/31/2020] furosemide  40 mg Intravenous Q12H  . insulin aspart  0-15 Units Subcutaneous TID WC  . insulin aspart  0-5 Units Subcutaneous QHS  . insulin detemir  45 Units Subcutaneous QHS  . ipratropium-albuterol  3 mL Inhalation Q4H  . isosorbide mononitrate  30 mg Oral Daily  . levothyroxine  300 mcg Oral Q0600  . mouth rinse  15 mL Mouth Rinse q12n4p  . methylPREDNISolone (SOLU-MEDROL) injection  60 mg Intravenous Q6H  . simvastatin  5 mg Oral Daily    acetaminophen **OR** acetaminophen, ondansetron **OR** ondansetron (ZOFRAN) IV, oxyCODONE   Objective:   Vitals:   12/30/20 0722 12/30/20 0749 12/30/20 1108 12/30/20 1122  BP:      Pulse: 90  Resp: (!) 22     Temp: 97.6 F (36.4 C)  97.6 F (36.4 C)   TempSrc: Oral  Oral   SpO2: 93% 95%  95%  Weight:      Height:        Intake/Output Summary (Last 24 hours) at 12/30/2020 1233 Last data filed at 12/30/2020 1134 Gross per 24 hour  Intake 2357 ml  Output 300 ml  Net 2057 ml   Filed Weights   12/28/20 0235 12/29/20 0500 12/30/20 0410  Weight: (!) 140 kg (!) 144.6 kg (!) 145.5 kg     Examination:      Physical Exam:   General:  Alert, oriented, cooperative, no distress;   HEENT:  Normocephalic, PERRL, otherwise with in Normal limits   Neuro:  CNII-XII intact. , normal motor and sensation, reflexes intact   Lungs:   Clear to auscultation BL, Respirations unlabored, ++ wheezes / no crackles  Cardio:    S1/S2, RRR, No murmure, No Rubs or Gallops   Abdomen:   Soft, non-tender, bowel sounds active all four  quadrants,  no guarding or peritoneal signs.  Muscular skeletal:  Limited exam - in bed, able to move all 4 extremities, Normal strength,  2+ pulses,  symmetric, +2 pitting edema  Skin:  Dry, warm to touch, negative for any Rashes,  Wounds: Please see nursing documentation            ------------------------------------------------------------------------------------------------------------------------------------------    LABs:  CBC Latest Ref Rng & Units 12/30/2020 12/29/2020 12/28/2020  WBC 4.0 - 10.5 K/uL 6.1 5.6 6.0  Hemoglobin 13.0 - 17.0 g/dL 14.2 13.6 15.7  Hematocrit 39.0 - 52.0 % 45.1 44.9 51.9  Platelets 150 - 400 K/uL 134(L) 127(L) 126(L)   CMP Latest Ref Rng & Units 12/30/2020 12/29/2020 12/28/2020  Glucose 70 - 99 mg/dL 146(H) 182(H) 128(H)  BUN 8 - 23 mg/dL 65(H) 51(H) 40(H)  Creatinine 0.61 - 1.24 mg/dL 3.27(H) 3.10(H) 2.82(H)  Sodium 135 - 145 mmol/L 138 137 139  Potassium 3.5 - 5.1 mmol/L 4.3 4.5 3.7  Chloride 98 - 111 mmol/L 102 101 101  CO2 22 - 32 mmol/L 25 26 29   Calcium 8.9 - 10.3 mg/dL 8.6(L) 8.4(L) 9.0  Total Protein 6.5 - 8.1 g/dL - - 8.0  Total Bilirubin 0.3 - 1.2 mg/dL - - 1.0  Alkaline Phos 38 - 126 U/L - - 98  AST 15 - 41 U/L - - 13(L)  ALT 0 - 44 U/L - - 8       Micro Results Recent Results (from the past 240 hour(s))  Resp Panel by RT-PCR (Flu A&B, Covid) Nasopharyngeal Swab     Status: None   Collection Time: 12/28/20  2:49 AM   Specimen: Nasopharyngeal Swab; Nasopharyngeal(NP) swabs in vial transport medium  Result Value Ref Range Status   SARS Coronavirus 2 by RT PCR NEGATIVE NEGATIVE Final    Comment: (NOTE) SARS-CoV-2 target nucleic acids are NOT DETECTED.  The SARS-CoV-2 RNA is generally detectable in upper respiratory specimens during the acute phase of infection. The lowest concentration of SARS-CoV-2 viral copies this assay can detect is 138 copies/mL. A negative result does not preclude SARS-Cov-2 infection and should not be  used as the sole basis for treatment or other patient management decisions. A negative result may occur with  improper specimen collection/handling, submission of specimen other than nasopharyngeal swab, presence of viral mutation(s) within the areas targeted by this assay, and inadequate number of viral copies(<138 copies/mL). A negative result  must be combined with clinical observations, patient history, and epidemiological information. The expected result is Negative.  Fact Sheet for Patients:  EntrepreneurPulse.com.au  Fact Sheet for Healthcare Providers:  IncredibleEmployment.be  This test is no t yet approved or cleared by the [place] FDA and  has been authorized for detection and/or diagnosis of SARS-CoV-2 by FDA under an Emergency Use Authorization (EUA). This EUA will remain  in effect (meaning this test can be used) for the duration of the COVID-19 declaration under Section 564(b)(1) of the Act, 21 U.S.C.section 360bbb-3(b)(1), unless the authorization is terminated  or revoked sooner.       Influenza A by PCR NEGATIVE NEGATIVE Final   Influenza B by PCR NEGATIVE NEGATIVE Final    Comment: (NOTE) The Xpert Xpress SARS-CoV-2/FLU/RSV plus assay is intended as an aid in the diagnosis of influenza from Nasopharyngeal swab specimens and should not be used as a sole basis for treatment. Nasal washings and aspirates are unacceptable for Xpert Xpress SARS-CoV-2/FLU/RSV testing.  Fact Sheet for Patients: EntrepreneurPulse.com.au  Fact Sheet for Healthcare Providers: IncredibleEmployment.be  This test is not yet approved or cleared by the [place] FDA and has been authorized for detection and/or diagnosis of SARS-CoV-2 by FDA under an Emergency Use Authorization (EUA). This EUA will remain in effect (meaning this test can be used) for the duration of the COVID-19 declaration under Section  564(b)(1) of the Act, 21 U.S.C. section 360bbb-3(b)(1), unless the authorization is terminated or revoked.  Performed at [facility], [address], [place], [place] [address]   MRSA PCR Screening     Status: None   Collection Time: 12/28/20 10:00 PM   Specimen: Nasal Mucosa; Nasopharyngeal  Result Value Ref Range Status   MRSA by PCR NEGATIVE NEGATIVE Final    Comment:        The GeneXpert MRSA Assay (FDA approved for NASAL specimens only), is one component of a comprehensive MRSA colonization surveillance program. It is not intended to diagnose MRSA infection nor to guide or monitor treatment for MRSA infections. Performed at [facility], [address] [place], [place] [address]     Radiology Reports DG Chest Portable 1 View  Result Date: 12/28/2020 CLINICAL DATA:  Shortness of breath EXAM: PORTABLE CHEST 1 VIEW COMPARISON:  11/19/2020 FINDINGS: Cardiomegaly with vascular congestion and diffuse interstitial opacity consistent with pulmonary edema. Small left greater than right pleural effusion. Basilar airspace disease on the left. Aortic atherosclerosis. No pneumothorax IMPRESSION: 1. Cardiomegaly with vascular congestion and pulmonary edema. 2. Small left greater than right pleural effusions with left basilar atelectasis or pneumonia. Similar findings on 11/19/2020 comparison Electronically Signed   By: Donavan Foil M.D.   On: 12/28/2020 03:20   DG Toe 3rd Right  Result Date: 12/28/2020 CLINICAL DATA:  Right third toe injury EXAM: RIGHT THIRD TOE COMPARISON:  None. FINDINGS: There is no evidence of fracture or dislocation. There is no evidence of arthropathy or other focal bone abnormality. Soft tissues are unremarkable. IMPRESSION: Negative. Electronically Signed   By: Monte Fantasia M.D.   On: 12/28/2020 05:39   ECHOCARDIOGRAM COMPLETE  Result Date: 12/28/2020    ECHOCARDIOGRAM REPORT   Patient Name:   PRANEETH BUSSEY Date of Exam: 12/28/2020 Medical Rec #:  [id number]      Height:       73.0 in Accession #:    [id number]    Weight:       308.6 lb Date of Birth:  05/13/1946  BSA:          2.587 m Patient Age:    25 years      BP:           146/83 mmHg Patient Gender: M             HR:           75 bpm. Exam Location:  [facility] Procedure: 2D Echo, Cardiac Doppler and Color Doppler Indications:    CHF  History:        Patient has prior history of Echocardiogram examinations, most                 recent 07/20/2020. CAD, Arrythmias:LBBB and Atrial Fibrillation;                 Risk Factors:Diabetes, Dyslipidemia, Hypertension and Current                 Smoker.  Sonographer:    Maudry Mayhew MHA, RDMS, RVT, RDCS Referring Phys: [id number] ASIA B [place]  Sonographer Comments: Suboptimal subcostal window. Image acquisition challenging due to patient body habitus, Image acquisition challenging due to respiratory motion and on CPAP. IMPRESSIONS  1. Left ventricular ejection fraction, by estimation, is 55 to 60%. The left ventricle has normal function. The left ventricle has no regional wall motion abnormalities. The left ventricular internal cavity size was moderately dilated. Left ventricular diastolic function could not be evaluated.  2. Right ventricular systolic function is normal. The right ventricular size is mildly enlarged. There is moderately elevated pulmonary artery systolic pressure.  3. Left atrial size was mildly dilated.  4. Right atrial size was moderately dilated.  5. The mitral valve is normal in structure. Trivial mitral valve regurgitation. No evidence of mitral stenosis.  6. The aortic valve is tricuspid. Aortic valve regurgitation is not visualized. Mild aortic valve sclerosis is present, with no evidence of aortic valve stenosis.  7. The inferior vena cava is dilated in size with >50% respiratory variability, suggesting right atrial pressure of 8 mmHg. FINDINGS  Left Ventricle: Left ventricular ejection fraction, by estimation, is 55 to 60%. The left  ventricle has normal function. The left ventricle has no regional wall motion abnormalities. Definity contrast agent was given IV to delineate the left ventricular  endocardial borders. The left ventricular internal cavity size was moderately dilated. There is no left ventricular hypertrophy. Left ventricular diastolic function could not be evaluated due to atrial fibrillation. Left ventricular diastolic function could not be evaluated. Right Ventricle: The right ventricular size is mildly enlarged.Right ventricular systolic function is normal. There is moderately elevated pulmonary artery systolic pressure. The tricuspid regurgitant velocity is 3.54 m/s, and with an assumed right atrial pressure of 8 mmHg, the estimated right ventricular systolic pressure is 85.8 mmHg. Left Atrium: Left atrial size was mildly dilated. Right Atrium: Right atrial size was moderately dilated. Pericardium: There is no evidence of pericardial effusion. Mitral Valve: The mitral valve is normal in structure. Trivial mitral valve regurgitation. No evidence of mitral valve stenosis. Tricuspid Valve: The tricuspid valve is normal in structure. Tricuspid valve regurgitation is trivial. No evidence of tricuspid stenosis. Aortic Valve: The aortic valve is tricuspid. Aortic valve regurgitation is not visualized. Mild aortic valve sclerosis is present, with no evidence of aortic valve stenosis. Aortic valve mean gradient measures 5.0 mmHg. Aortic valve peak gradient measures 10.2 mmHg. Aortic valve area, by VTI measures 1.90 cm. Pulmonic Valve: The pulmonic valve was not well visualized. Pulmonic valve  regurgitation is not visualized. No evidence of pulmonic stenosis. Aorta: The aortic root is normal in size and structure. Venous: The inferior vena cava is dilated in size with greater than 50% respiratory variability, suggesting right atrial pressure of 8 mmHg.   LEFT VENTRICLE PLAX 2D LVIDd:         6.30 cm LVIDs:         4.80 cm LV PW:          0.70 cm LV IVS:        0.90 cm LVOT diam:     2.00 cm LV SV:         60 LV SV Index:   23 LVOT Area:     3.14 cm  LV Volumes (MOD) LV vol d, MOD A2C: 186.0 ml LV vol d, MOD A4C: 153.0 ml LV vol s, MOD A2C: 71.4 ml LV vol s, MOD A4C: 104.0 ml LV SV MOD A2C:     114.6 ml LV SV MOD A4C:     153.0 ml LV SV MOD BP:      88.7 ml RIGHT VENTRICLE TAPSE (M-mode): 1.7 cm LEFT ATRIUM              Index       RIGHT ATRIUM           Index LA diam:        4.10 cm  1.58 cm/m  RA Area:     32.20 cm LA Vol (A2C):   134.0 ml 51.79 ml/m RA Volume:   137.00 ml 52.95 ml/m LA Vol (A4C):   58.3 ml  22.53 ml/m LA Biplane Vol: 90.1 ml  34.82 ml/m  AORTIC VALVE AV Area (Vmax):    1.84 cm AV Area (Vmean):   1.73 cm AV Area (VTI):     1.90 cm AV Vmax:           160.00 cm/s AV Vmean:          107.000 cm/s AV VTI:            0.315 m AV Peak Grad:      10.2 mmHg AV Mean Grad:      5.0 mmHg LVOT Vmax:         93.60 cm/s LVOT Vmean:        58.800 cm/s LVOT VTI:          0.191 m LVOT/AV VTI ratio: 0.61  AORTA Ao Root diam: 3.40 cm TRICUSPID VALVE TR Peak grad:   50.1 mmHg TR Vmax:        354.00 cm/s  SHUNTS Systemic VTI:  0.19 m Systemic Diam: 2.00 cm Kirk Ruths MD Electronically signed by Kirk Ruths MD Signature Date/Time: 12/28/2020/12:18:15 PM    Final     SIGNED: Deatra James, MD, FHM. [facility],  Pager (please use [email or web address] to page/text) Please use Epic Secure Chat for non-urgent communication (7AM-7PM)  If 7PM-7AM, please contact night-coverage [email or web address], 12/30/2020, 12:33 PM

## 2020-12-31 DIAGNOSIS — I4891 Unspecified atrial fibrillation: Secondary | ICD-10-CM

## 2020-12-31 DIAGNOSIS — I5033 Acute on chronic diastolic (congestive) heart failure: Secondary | ICD-10-CM

## 2020-12-31 LAB — BASIC METABOLIC PANEL
Anion gap: 13 (ref 5–15)
BUN: 80 mg/dL — ABNORMAL HIGH (ref 8–23)
CO2: 23 mmol/L (ref 22–32)
Calcium: 8.6 mg/dL — ABNORMAL LOW (ref 8.9–10.3)
Chloride: 101 mmol/L (ref 98–111)
Creatinine, Ser: 3.21 mg/dL — ABNORMAL HIGH (ref 0.61–1.24)
GFR, Estimated: 20 mL/min — ABNORMAL LOW (ref >60)
Glucose, Bld: 163 mg/dL — ABNORMAL HIGH (ref 70–99)
Potassium: 4.2 mmol/L (ref 3.5–5.1)
Sodium: 137 mmol/L (ref 135–145)

## 2020-12-31 LAB — CBC
HCT: 46.4 % (ref 39.0–52.0)
Hemoglobin: 14.5 g/dL (ref 13.0–17.0)
MCH: 28.4 pg (ref 26.0–34.0)
MCHC: 31.3 g/dL (ref 30.0–36.0)
MCV: 91 fL (ref 80.0–100.0)
Platelets: 123 10*3/uL — ABNORMAL LOW (ref 150–400)
RBC: 5.1 MIL/uL (ref 4.22–5.81)
RDW: 16.5 % — ABNORMAL HIGH (ref 11.5–15.5)
WBC: 7.2 10*3/uL (ref 4.0–10.5)
nRBC: 0 % (ref 0.0–0.2)

## 2020-12-31 LAB — GLUCOSE, CAPILLARY
Glucose-Capillary: 152 mg/dL — ABNORMAL HIGH (ref 70–99)
Glucose-Capillary: 128 mg/dL — ABNORMAL HIGH (ref 70–99)
Glucose-Capillary: 152 mg/dL — ABNORMAL HIGH (ref 70–99)
Glucose-Capillary: 259 mg/dL — ABNORMAL HIGH (ref 70–99)

## 2020-12-31 LAB — BRAIN NATRIURETIC PEPTIDE: B Natriuretic Peptide: 199 pg/mL — ABNORMAL HIGH (ref 0.0–100.0)

## 2020-12-31 MED ORDER — METHYLPREDNISOLONE SODIUM SUCC 125 MG IJ SOLR
60.0000 mg | Freq: Two times a day (BID) | INTRAMUSCULAR | Status: DC
  Administered 2020-12-31 – 2021-01-02 (×4): 60 mg via INTRAVENOUS
  Filled 2020-12-31 (×4): qty 2

## 2020-12-31 MED ORDER — CHLORHEXIDINE GLUCONATE 0.12 % MT SOLN: 15.0000 mL | Freq: Two times a day (BID) | OROMUCOSAL | 0 refills | Status: DC

## 2020-12-31 MED ORDER — FUROSEMIDE 10 MG/ML IJ SOLN: 40.0000 mg | Freq: Two times a day (BID) | INTRAMUSCULAR | Status: DC

## 2020-12-31 MED ORDER — ORAL CARE MOUTH RINSE: 15.0000 mL | Freq: Two times a day (BID) | OROMUCOSAL | 0 refills | Status: DC

## 2020-12-31 MED ORDER — FUROSEMIDE 80 MG PO TABS: 40.0000 mg | ORAL_TABLET | Freq: Every day | ORAL | 1 refills | Status: DC

## 2020-12-31 MED ORDER — INSULIN ASPART 100 UNIT/ML IJ SOLN: 0.0000 [IU] | Freq: Three times a day (TID) | INTRAMUSCULAR | 3 refills | Status: DC

## 2020-12-31 MED ORDER — METHYLPREDNISOLONE 4 MG PO TBPK: ORAL_TABLET | ORAL | 0 refills | Status: DC

## 2020-12-31 NOTE — Plan of Care (Signed)
Discussed with patient plan of care for the evening, pain management and bedtime medications woith some teach back displayed.  Patient stated he needed rest tonight and will work medications around him resting.  Problem: Education: Goal: Knowledge of General Education information will improve Description: Including pain rating scale, medication(s)/side effects and non-pharmacologic comfort measures Outcome: Progressing

## 2020-12-31 NOTE — Progress Notes (Signed)
[name] [name]  Assessment/ Plan: Pt is a 75 y.o. yo male  with history of hypertension, HLD, DM, obesity, A. fib, non-Hodgkin's lymphoma, status post radiation and chemotherapy per chart, peripheral neuropathy, CKD follows with nephrologist in [place] (baseline cr around 3 per pt), COPD with chronic hypoxia presented with worsening shortness of breath she has a consultation for AKI on CKD.  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  He presented with shortness of breath which was probably combination of mild CHF and COPD.  He received multiple doses of IV Lasix with uptrending BUN and creatinine level today.  He does not look overtly volume up on exam.  The Lasix was held yesterday and he had a urine output of around 2 L.  Creatinine level stable today.  I will continue to hold diuretics and repeat lab in the morning.   If creatinine level stable then I think he can be discharged with oral Lasix and follow-up with his nephrologist as outpatient.  He does not have any uremic features and no need for  dialysis at this time.  #Shortness of breath/chronic hypoxic respiratory failure/COPD: He is currently using 3 L of oxygen which is his home dose.  Received multiple doses of IV diuretics for CHF as discussed above.  Continue bronchodilators, steroid.   #Heart failure with preserved EF: Complicated by CKD.  Only minimal elevation of BNP.  Diuretics as discussed above.  #Hypertension: Resume home antihypertensive medication including amlodipine, isosorbide.  Monitor BP.  Daily evaluation for diuretics need.  #Atrial fibrillation: On Eliquis.  Monitor heart rate.  Discussed with the primary team.  Subjective: Seen and examined at bedside.  The urine output is around 2 L.  He has some wheezing today.  Requiring around 3 L of oxygen.  No chest  pain.  No new event. Objective Vital signs in last 24 hours: Vitals:   12/31/20 0700 12/31/20 0744 12/31/20 0800 12/31/20 0815  BP:      Pulse: 84 92 77 94  Resp:      Temp:  (!) 97.3 F (36.3 C)    TempSrc:  Oral    SpO2: 93% 93% 97% 94%  Weight:      Height:       Weight change:   Intake/Output Summary (Last 24 hours) at 12/31/2020 0959 Last data filed at 12/31/2020 0750 Gross per 24 hour  Intake 960 ml  Output 1950 ml  Net -990 ml       Labs: Basic Metabolic Panel: Recent Labs  Lab 12/29/20 0519 12/30/20 0529 12/31/20 0421  NA 137 138 137  K 4.5 4.3 4.2  CL 101 102 101  CO2 26 25 23   GLUCOSE 182* 146* 163*  BUN 51* 65* 80*  CREATININE 3.10* 3.27* 3.21*  CALCIUM 8.4* 8.6* 8.6*   Liver Function Tests: Recent Labs  Lab 12/28/20 0236 12/28/20 0550  AST 13* 13*  ALT 8 8  ALKPHOS 91 98  BILITOT 1.0 1.0  PROT 7.5 8.0  ALBUMIN 4.0 4.3   No results for input(s): LIPASE, AMYLASE in the last 168 hours. No results for input(s): AMMONIA in the last 168 hours. CBC: Recent Labs  Lab 12/28/20 0236 12/28/20 0550 12/29/20 0519 12/30/20 0529 12/31/20 0421  WBC 5.9 6.0 5.6 6.1 7.2  NEUTROABS 4.3  --   --   --   --  HGB 14.3 15.7 13.6 14.2 14.5  HCT 46.4 51.9 44.9 45.1 46.4  MCV 92.2 92.2 92.8 90.7 91.0  PLT 127* 126* 127* 134* 123*   Cardiac Enzymes: No results for input(s): CKTOTAL, CKMB, CKMBINDEX, TROPONINI in the last 168 hours. CBG: Recent Labs  Lab 12/30/20 0723 12/30/20 1110 12/30/20 1600 12/30/20 2157 12/31/20 0742  GLUCAP 138* 199* 148* 187* 152*    Iron Studies: No results for input(s): IRON, TIBC, TRANSFERRIN, FERRITIN in the last 72 hours. Studies/Results: No results found.  Medications: Infusions:   Scheduled Medications: . amLODipine  10 mg Oral Daily  . apixaban  5 mg Oral BID  . aspirin EC  81 mg Oral Daily  . budesonide (PULMICORT) nebulizer solution  0.25 mg Nebulization BID  . chlorhexidine  15 mL Mouth Rinse BID  .  Chlorhexidine Gluconate Cloth  6 each Topical Daily  . insulin aspart  0-15 Units Subcutaneous TID WC  . insulin aspart  0-5 Units Subcutaneous QHS  . insulin detemir  45 Units Subcutaneous QHS  . ipratropium-albuterol  3 mL Inhalation Q4H  . isosorbide mononitrate  30 mg Oral Daily  . levothyroxine  300 mcg Oral Q0600  . mouth rinse  15 mL Mouth Rinse q12n4p  . methylPREDNISolone (SOLU-MEDROL) injection  60 mg Intravenous Q12H  . simvastatin  5 mg Oral QHS    have reviewed scheduled and prn medications.  Physical Exam: General:NAD, comfortable Heart:RRR, s1s2 nl, no rubs Lungs:clear b/l, no crackle Abdomen:soft, Non-tender, non-distended Extremities: Chronic stasis changes, no pitting edema Neurology: Alert, awake and following commands.  No asterixis  Ambers Iyengar Prasad Sevanna Ballengee 12/31/2020,9:59 AM  LOS: 3 days

## 2020-12-31 NOTE — Consult Note (Addendum)
Cardiology Consultation:   Patient ID: [name] [name] MRN: [id number]; DOB: 08/20/1945  Admit date: 12/28/2020 Date of Consult: 12/31/2020  PCP:  Nickola Major, MD   [facility] Providers Cardiologist:  Rozann Lesches, MD   {Pat  Pa Patient says he's going to f/u up with Dr. Obie Dredge cardiologist in [place]-he can't remember his last name.   Patient Profile:   [name] [name] is a 75 y.o. male with a hx of diastolic CHF, CKD, persistent Afib who is being seen 12/31/2020 for the evaluation of CHF and Afib at the request of Shahmehdi.  History of Present Illness:   Mr. Pickup with a history of CAD status post DES to the LAD 2018, CKD stage III-4, hypertension, hyperlipidemia, left bundle Nadim Malia block, type 2 diabetes mellitus, OSA,atrial fibrillation on eliquis.  Patient admitted now with acute respiratory failure with hypoxia secondary to CHF and COPD on BiPAP, solu-medrol, DuoNeb complicated by AKI Crt 4.09-8.11. Patient lives with daughter. Eating a lot of sausage, processed foods and frozen meals his insurance company sends him. Has gained a few pounds. Says his breathing worsened because he's been overeating so came to ED. Feels heart speed up when he uses duo-neb.    Past Medical History:  Diagnosis Date  . Atrial fibrillation ([place])   . CAD (coronary artery disease)    DES to LAD June 2018  . CKD (chronic kidney disease) stage 3, GFR 30-59 ml/min (HCC)   . Essential hypertension   . History of pneumonia   . Hyperlipidemia   . Hypothyroidism   . LBBB (left bundle Averie Meiner block)   . Morbid obesity ([place])   . Non Hodgkin's lymphoma ([place])    Status post XRT and chemotherapy  . NSTEMI (non-ST elevated myocardial infarction) [facility])    June 2018  . Peripheral neuropathy   . Pneumonia due to COVID-19 virus   . Sleep apnea   . Type 2 diabetes mellitus ([place])     Past Surgical History:  Procedure Laterality Date  . CHOLECYSTECTOMY  1992  . COLONOSCOPY N/A 09/03/2014   SLF:six  colon polyps removed/small internal hemorrhoids  . CORONARY STENT INTERVENTION N/A 01/21/2017   Procedure: Coronary Stent Intervention;  Surgeon: Nelva Bush, MD;  Location: [facility];  Service: Cardiovascular;  Laterality: N/A;  . ESOPHAGOGASTRODUODENOSCOPY N/A 09/03/2014   SLF: mild gastritis/few gastric polyps  . LEFT HEART CATH AND CORONARY ANGIOGRAPHY N/A 01/20/2017   Procedure: Left Heart Cath and Coronary Angiography;  Surgeon: Jettie Booze, MD;  Location: [facility];  Service: Cardiovascular;  Laterality: N/A;  . TOTAL KNEE ARTHROPLASTY  11/10/2011   Procedure: TOTAL KNEE ARTHROPLASTY;  Surgeon: Mauri Pole, MD;  Location: [facility];  Service: Orthopedics;  Laterality: Right;  . TOTAL KNEE ARTHROPLASTY Left 05/24/2018   Procedure: LEFT TOTAL KNEE ARTHROPLASTY;  Surgeon: Melrose Nakayama, MD;  Location: [facility];  Service: Orthopedics;  Laterality: Left;     Home Medications:  Prior to Admission medications   Medication Sig Start Date End Date Taking? Authorizing Provider  acetaminophen (TYLENOL) 325 MG tablet Take 2 tablets (650 mg total) by mouth every 6 (six) hours as needed for mild pain (or Fever >/= 101). 02/01/20  Yes Emokpae, Courage, MD  albuterol (VENTOLIN HFA) 108 (90 Base) MCG/ACT inhaler Inhale 1-2 puffs into the lungs every 6 (six) hours as needed for wheezing or shortness of breath. 01/10/19  Yes Long, Wonda Olds, MD  amLODipine (NORVASC) 10 MG tablet Take 10 mg  by mouth daily.   Yes [provider]  apixaban (ELIQUIS) 5 MG TABS tablet Take 1 tablet (5 mg total) by mouth 2 (two) times daily. This is a dose change 12/05/20  Yes Verta Ellen., NP  aspirin EC 81 MG tablet Take 81 mg by mouth daily. Swallow whole.   Yes [provider]  bisoprolol (ZEBETA) 5 MG tablet Take 5 mg by mouth daily. 11/26/20  Yes [provider]  furosemide (LASIX) 80 MG tablet Take 1 tablet (80 mg total) by mouth daily as needed for fluid or edema.  10/11/20  Yes Shah, Pratik D, DO  insulin degludec (TRESIBA) 100 UNIT/ML FlexTouch Pen Inject 59 Units into the skin daily at 10 pm.   Yes [provider]  ipratropium-albuterol (DUONEB) 0.5-2.5 (3) MG/3ML SOLN Inhale 3 mLs into the lungs every 4 (four) hours as needed (shortness of breath). 05/22/20  Yes [provider]  isosorbide mononitrate (IMDUR) 30 MG 24 hr tablet Take 1 tablet (30 mg total) by mouth daily. 12/04/20  Yes Satira Sark, MD  levocetirizine (XYZAL) 5 MG tablet Take 5 mg by mouth every evening.  08/27/17  Yes [provider]  levothyroxine (SYNTHROID) 100 MCG tablet Take 100 mcg by mouth daily. (takes with 232mcg for a total of 390mcg) 01/08/20  Yes [provider]  levothyroxine (SYNTHROID, LEVOTHROID) 200 MCG tablet Take 300 mcg by mouth daily before breakfast. (takes with 167mcg for a total of 346mcg)   Yes [provider]  nitroGLYCERIN (NITROSTAT) 0.4 MG SL tablet Place 0.4 mg under the tongue every 5 (five) minutes as needed for chest pain.  12/27/17  Yes [provider]  senna-docusate (SENOKOT-S) 8.6-50 MG tablet Take 1 tablet by mouth daily as needed for mild constipation. 02/01/20  Yes [provider]  simvastatin (ZOCOR) 5 MG tablet Take 5 mg by mouth daily. 09/14/16  Yes [provider]  SYMBICORT 160-4.5 MCG/ACT inhaler Inhale 2 puffs into the lungs daily as needed (shortness of breath). 12/22/19  Yes [provider]  fluticasone (FLONASE) 50 MCG/ACT nasal spray Place 2 sprays into both nostrils as needed for up to 14 days. 08/10/20 08/24/20  Johnson, Clanford L, MD  nystatin (MYCOSTATIN/NYSTOP) powder Apply topically 2 (two) times daily. Patient not taking: No sig reported 10/11/20   Manuella Ghazi, Pratik D, DO  OXYGEN Inhale 3 L into the lungs daily as needed.    [provider]  potassium chloride SA (KLOR-CON) 20 MEQ tablet Take 1 tablet (20 mEq total) by mouth 2 (two) times daily. Patient not  taking: Reported on 12/30/2020 08/10/20   Murlean Iba, MD    Inpatient Medications: Scheduled Meds: . amLODipine  10 mg Oral Daily  . apixaban  5 mg Oral BID  . aspirin EC  81 mg Oral Daily  . budesonide (PULMICORT) nebulizer solution  0.25 mg Nebulization BID  . chlorhexidine  15 mL Mouth Rinse BID  . Chlorhexidine Gluconate Cloth  6 each Topical Daily  . insulin aspart  0-15 Units Subcutaneous TID WC  . insulin aspart  0-5 Units Subcutaneous QHS  . insulin detemir  45 Units Subcutaneous QHS  . ipratropium-albuterol  3 mL Inhalation Q4H  . isosorbide mononitrate  30 mg Oral Daily  . levothyroxine  300 mcg Oral Q0600  . mouth rinse  15 mL Mouth Rinse q12n4p  . methylPREDNISolone (SOLU-MEDROL) injection  60 mg Intravenous Q12H  . simvastatin  5 mg Oral QHS   Continuous Infusions:  PRN Meds: acetaminophen **OR** acetaminophen, ondansetron **OR** ondansetron (ZOFRAN) IV, oxyCODONE  Allergies:   No Known Allergies  Social History:   Social History   Socioeconomic History  . Marital status: Divorced    Spouse name: Not on file  . Number of children: Not on file  . Years of education: Not on file  . Highest education level: Not on file  Occupational History  . Not on file  Tobacco Use  . Smoking status: Former Smoker    Packs/day: 0.25    Years: 30.00    Pack years: 7.50    Types: Cigarettes    Quit date: 10/2019    Years since quitting: 1.2  . Smokeless tobacco: Never Used  Vaping Use  . Vaping Use: Never used  Substance and Sexual Activity  . Alcohol use: No    Alcohol/week: 0.0 standard drinks  . Drug use: No  . Sexual activity: Yes  Other Topics Concern  . Not on file  Social History Narrative  . Not on file   Social Determinants of Health   Financial Resource Strain: Not on file  Food Insecurity: Not on file  Transportation Needs: Not on file  Physical Activity: Not on file  Stress: Not on file  Social Connections: Not on file  Intimate Partner  Violence: Not At Risk  . Fear of Current or Ex-Partner: No  . Emotionally Abused: No  . Physically Abused: No  . Sexually Abused: No    Family History:     Family History  Problem Relation Age of Onset  . Cancer Mother        breast and lung  . Cancer Father        bladder  . Cancer Maternal Uncle        prostate  . Cancer Paternal Uncle        esophagus  . Colon cancer Neg Hx      ROS:  Please see the history of present illness.  Review of Systems  Constitutional: Negative.  HENT: Negative.   Cardiovascular: Positive for dyspnea on exertion, irregular heartbeat and leg swelling.  Respiratory: Positive for shortness of breath, sleep disturbances due to breathing and wheezing.   Endocrine: Negative.   Hematologic/Lymphatic: Negative.   Musculoskeletal: Negative.   Gastrointestinal: Negative.   Genitourinary: Negative.   Neurological: Negative.     All other ROS reviewed and negative.     Physical Exam/Data:   Vitals:   12/31/20 0700 12/31/20 0744 12/31/20 0800 12/31/20 0815  BP:      Pulse: 84 92 77 94  Resp:      Temp:  (!) 97.3 F (36.3 C)    TempSrc:  Oral    SpO2: 93% 93% 97% 94%  Weight:      Height:        Intake/Output Summary (Last 24 hours) at 12/31/2020 0922 Last data filed at 12/31/2020 0750 Gross per 24 hour  Intake 960 ml  Output 1950 ml  Net -990 ml   Last 3 Weights 12/30/2020 12/29/2020 12/28/2020  Weight (lbs) 320 lb 12.3 oz 318 lb 12.6 oz 308 lb 10.3 oz  Weight (kg) 145.5 kg 144.6 kg 140 kg     Body mass index is 42.32 kg/m.  General:  Obese, in no acute distress HEENT: normal Lymph: no adenopathy Neck: no JVD Endocrine:  No thryomegaly Vascular: No carotid bruits; FA pulses 2+ bilaterally without bruits  Cardiac:  normal S1, S2; irreg irreg; no murmur   Lungs:  upper resp wheezing,   rales and rhonchi bases Abd: soft, nontender, no hepatomegaly  Ext: plus 1-2 brawny edema Musculoskeletal:  No deformities, BUE and BLE strength  normal and equal Skin: warm and dry  Neuro:  CNs 2-12 intact, no focal abnormalities noted Psych:  Normal affect   EKG:  The EKG was personally reviewed and demonstrates:  Afib 77/m IVCD old ant wall MI Telemetry:  Telemetry was personally reviewed and demonstrates:  Afib with PVC's 3 beat NSVT  Relevant CV Studies:  Echo 12/28/20 IMPRESSIONS     1. Left ventricular ejection fraction, by estimation, is 55 to 60%. The  left ventricle has normal function. The left ventricle has no regional  wall motion abnormalities. The left ventricular internal cavity size was  moderately dilated. Left ventricular  diastolic function could not be evaluated.   2. Right ventricular systolic function is normal. The right ventricular  size is mildly enlarged. There is moderately elevated pulmonary artery  systolic pressure.   3. Left atrial size was mildly dilated.   4. Right atrial size was moderately dilated.   5. The mitral valve is normal in structure. Trivial mitral valve  regurgitation. No evidence of mitral stenosis.   6. The aortic valve is tricuspid. Aortic valve regurgitation is not  visualized. Mild aortic valve sclerosis is present, with no evidence of  aortic valve stenosis.   7. The inferior vena cava is dilated in size with >50% respiratory  variability, suggesting right atrial pressure of 8 mmHg.      Echocardiogram 07/20/2020 IMPRESSIONS  1. Left ventricular ejection fraction, by estimation, is 50 to 55%. The  left ventricle has low normal function. Left ventricular endocardial  border not optimally defined to evaluate regional wall motion. Left  ventricular diastolic parameters are  consistent with Grade II diastolic dysfunction (pseudonormalization).  Elevated left atrial pressure.   2. Right ventricular systolic function is normal. The right ventricular  size is normal.   3. Left atrial size was mildly dilated.   4. The mitral valve is normal in structure. Trivial mitral  valve  regurgitation. No evidence of mitral stenosis.   5. The aortic valve is tricuspid. There is mild calcification of the  aortic valve. There is mild thickening of the aortic valve. Aortic valve  regurgitation is not visualized. No aortic stenosis is present.   6. The inferior vena cava is normal in size with greater than 50%  respiratory variability, suggesting right atrial pressure of 3 mmHg.       Laboratory Data:  High Sensitivity Troponin:   Recent Labs  Lab 12/28/20 0236 12/28/20 0550  TROPONINIHS 28* 31*     Chemistry Recent Labs  Lab 12/29/20 0519 12/30/20 0529 12/31/20 0421  NA 137 138 137  K 4.5 4.3 4.2  CL 101 102 101  CO2 26 25 23   GLUCOSE 182* 146* 163*  BUN 51* 65* 80*  CREATININE 3.10* 3.27* 3.21*  CALCIUM 8.4* 8.6* 8.6*  GFRNONAA 20* 19* 20*  ANIONGAP 10 11 13     Recent Labs  Lab 12/28/20 0236 12/28/20 0550  PROT 7.5 8.0  ALBUMIN 4.0 4.3  AST 13* 13*  ALT 8 8  ALKPHOS 91 98  BILITOT 1.0 1.0   Hematology Recent Labs  Lab 12/29/20 0519 12/30/20 0529 12/31/20 0421  WBC 5.6 6.1 7.2  RBC 4.84 4.97 5.10  HGB 13.6 14.2 14.5  HCT 44.9 45.1 46.4  MCV 92.8 90.7 91.0  MCH 28.1 28.6 28.4  MCHC 30.3 31.5  31.3  RDW 16.7* 16.8* 16.5*  PLT 127* 134* 123*   BNP Recent Labs  Lab 12/29/20 0519 12/30/20 0529 12/31/20 0421  BNP 241.0* 180.0* 199.0*    DDimer No results for input(s): DDIMER in the last 168 hours.   Radiology/Studies:  DG Chest Portable 1 View  Result Date: 12/28/2020 CLINICAL DATA:  Shortness of breath EXAM: PORTABLE CHEST 1 VIEW COMPARISON:  11/19/2020 FINDINGS: Cardiomegaly with vascular congestion and diffuse interstitial opacity consistent with pulmonary edema. Small left greater than right pleural effusion. Basilar airspace disease on the left. Aortic atherosclerosis. No pneumothorax IMPRESSION: 1. Cardiomegaly with vascular congestion and pulmonary edema. 2. Small left greater than right pleural effusions with left  basilar atelectasis or pneumonia. Similar findings on 11/19/2020 comparison Electronically Signed   By: Donavan Foil M.D.   On: 12/28/2020 03:20   DG Toe 3rd Right  Result Date: 12/28/2020 CLINICAL DATA:  Right third toe injury EXAM: RIGHT THIRD TOE COMPARISON:  None. FINDINGS: There is no evidence of fracture or dislocation. There is no evidence of arthropathy or other focal bone abnormality. Soft tissues are unremarkable. IMPRESSION: Negative. Electronically Signed   By: Monte Fantasia M.D.   On: 12/28/2020 05:39   ECHOCARDIOGRAM COMPLETE  Result Date: 12/28/2020    ECHOCARDIOGRAM REPORT   Patient Name:   [name] [name] Date of Exam: 12/28/2020 Medical Rec #:  [id number]     Height:       73.0 in Accession #:    [id number]    Weight:       308.6 lb Date of Birth:  Aug 22, 1945     BSA:          2.587 m Patient Age:    55 years      BP:           146/83 mmHg Patient Gender: M             HR:           75 bpm. Exam Location:  [facility] Procedure: 2D Echo, Cardiac Doppler and Color Doppler Indications:    CHF  History:        Patient has prior history of Echocardiogram examinations, most                 recent 07/20/2020. CAD, Arrythmias:LBBB and Atrial Fibrillation;                 Risk Factors:Diabetes, Dyslipidemia, Hypertension and Current                 Smoker.  Sonographer:    Maudry Mayhew MHA, RDMS, RVT, RDCS Referring Phys: [id number] ASIA B [place]  Sonographer Comments: Suboptimal subcostal window. Image acquisition challenging due to patient body habitus, Image acquisition challenging due to respiratory motion and on CPAP. IMPRESSIONS  1. Left ventricular ejection fraction, by estimation, is 55 to 60%. The left ventricle has normal function. The left ventricle has no regional wall motion abnormalities. The left ventricular internal cavity size was moderately dilated. Left ventricular diastolic function could not be evaluated.  2. Right ventricular systolic function is normal. The right  ventricular size is mildly enlarged. There is moderately elevated pulmonary artery systolic pressure.  3. Left atrial size was mildly dilated.  4. Right atrial size was moderately dilated.  5. The mitral valve is normal in structure. Trivial mitral valve regurgitation. No evidence of mitral stenosis.  6. The aortic valve is tricuspid. Aortic valve regurgitation is not visualized. Mild aortic valve  sclerosis is present, with no evidence of aortic valve stenosis.  7. The inferior vena cava is dilated in size with >50% respiratory variability, suggesting right atrial pressure of 8 mmHg. FINDINGS  Left Ventricle: Left ventricular ejection fraction, by estimation, is 55 to 60%. The left ventricle has normal function. The left ventricle has no regional wall motion abnormalities. Definity contrast agent was given IV to delineate the left ventricular  endocardial borders. The left ventricular internal cavity size was moderately dilated. There is no left ventricular hypertrophy. Left ventricular diastolic function could not be evaluated due to atrial fibrillation. Left ventricular diastolic function could not be evaluated. Right Ventricle: The right ventricular size is mildly enlarged.Right ventricular systolic function is normal. There is moderately elevated pulmonary artery systolic pressure. The tricuspid regurgitant velocity is 3.54 m/s, and with an assumed right atrial pressure of 8 mmHg, the estimated right ventricular systolic pressure is 67.6 mmHg. Left Atrium: Left atrial size was mildly dilated. Right Atrium: Right atrial size was moderately dilated. Pericardium: There is no evidence of pericardial effusion. Mitral Valve: The mitral valve is normal in structure. Trivial mitral valve regurgitation. No evidence of mitral valve stenosis. Tricuspid Valve: The tricuspid valve is normal in structure. Tricuspid valve regurgitation is trivial. No evidence of tricuspid stenosis. Aortic Valve: The aortic valve is tricuspid.  Aortic valve regurgitation is not visualized. Mild aortic valve sclerosis is present, with no evidence of aortic valve stenosis. Aortic valve mean gradient measures 5.0 mmHg. Aortic valve peak gradient measures 10.2 mmHg. Aortic valve area, by VTI measures 1.90 cm. Pulmonic Valve: The pulmonic valve was not well visualized. Pulmonic valve regurgitation is not visualized. No evidence of pulmonic stenosis. Aorta: The aortic root is normal in size and structure. Venous: The inferior vena cava is dilated in size with greater than 50% respiratory variability, suggesting right atrial pressure of 8 mmHg.   LEFT VENTRICLE PLAX 2D LVIDd:         6.30 cm LVIDs:         4.80 cm LV PW:         0.70 cm LV IVS:        0.90 cm LVOT diam:     2.00 cm LV SV:         60 LV SV Index:   23 LVOT Area:     3.14 cm  LV Volumes (MOD) LV vol d, MOD A2C: 186.0 ml LV vol d, MOD A4C: 153.0 ml LV vol s, MOD A2C: 71.4 ml LV vol s, MOD A4C: 104.0 ml LV SV MOD A2C:     114.6 ml LV SV MOD A4C:     153.0 ml LV SV MOD BP:      88.7 ml RIGHT VENTRICLE TAPSE (M-mode): 1.7 cm LEFT ATRIUM              Index       RIGHT ATRIUM           Index LA diam:        4.10 cm  1.58 cm/m  RA Area:     32.20 cm LA Vol (A2C):   134.0 ml 51.79 ml/m RA Volume:   137.00 ml 52.95 ml/m LA Vol (A4C):   58.3 ml  22.53 ml/m LA Biplane Vol: 90.1 ml  34.82 ml/m  AORTIC VALVE AV Area (Vmax):    1.84 cm AV Area (Vmean):   1.73 cm AV Area (VTI):     1.90 cm AV Vmax:  160.00 cm/s AV Vmean:          107.000 cm/s AV VTI:            0.315 m AV Peak Grad:      10.2 mmHg AV Mean Grad:      5.0 mmHg LVOT Vmax:         93.60 cm/s LVOT Vmean:        58.800 cm/s LVOT VTI:          0.191 m LVOT/AV VTI ratio: 0.61  AORTA Ao Root diam: 3.40 cm TRICUSPID VALVE TR Peak grad:   50.1 mmHg TR Vmax:        354.00 cm/s  SHUNTS Systemic VTI:  0.19 m Systemic Diam: 2.00 cm Kirk Ruths MD Electronically signed by Kirk Ruths MD Signature Date/Time: 12/28/2020/12:18:15 PM     Final      Assessment and Plan:   Acute on chronic diastolic CHF echo 7/37 LVEF 55-60%, BNP 241 due to dietary indiscretion, complicated by AKI. I/O's positive 940. Received lasix 80 mg IV 5/28 and 40 mg 5/29 but now on hold because of renal.   Persistent Afib on Eliquis, history of bradycardia-bisoprolol held in past but currently tolerating    CAD DES LAD 2018-no angina  CKD stage 3-4Crt 3 range being followed by nephrology  HTN BP up this am on amlodipine 10 mg daily, Imdur 30 mg-could increase 60 mg  HLD on zocor  DM managed by primary team     Risk Assessment/Risk Scores:        New York Heart Association (NYHA) Functional Class NYHA Class III  CHA2DS2-VASc Score = 4  This indicates a 4.8% annual risk of stroke. The patient's score is based upon: CHF History: No HTN History: Yes Diabetes History: Yes Stroke History: No Vascular Disease History: Yes Age Score: 1 Gender Score: 0         For questions or updates, please contact [place] Please consult [email or web address] for contact info under    Signed, Ermalinda Barrios, PA-C  12/31/2020 9:22 AM   Attending note  Patient seen and discussed with PA Bonnell Public, I agree with her documentation. 75 yo male history of chronic combined sysotlic/diastolic HF with mild RV dysfunction, CAD prior DES to LAD in 2018, CKD 3, HTN, HL, OSA, DM2, afib, COPD home O2 3L admitted with dyspnea, productive cough, leg swelling. Initially on bipap, weaned to [facility].    WBC 5.9 Hgb 14.3 Plt 127 K 3.4 BUN 39 Cr 2.89 BNP 266 Mg 2.3  Trop 28-->31--> ABG 7.299/58.2/81/24 COVID neg CXR pulm edema, small L>R pleural effusions.  EKG afib, LBBB  12/2020 echo: LVE 55-60%, no WMAs, normal RV function    Acute on chronic diastolic HF - 08/624 echo: LVE 55-60%, no WMAs, normal RV function - I/Os incomplete this admission, weights appear inaccurate. DIfficult to assess volume status by physical exam due to body habitus, does not appear that  he is still significnatly overloaded Diuretics on hold due to AKI on CKD.  - I think lingering SOB is more COPD related as he has ongoing wheezing, can continue to hold diuretic and resume oral regimen when ok with renal, defer dosing to renal.   COPD exacerbation - per primary team, has been on nebs, steroids  Afib - Bradycardia on bisoprolol, has been self rate controlled - on eliquis.  AKI on CKD -very variable baseline over the last few months 2.2 to 3.6 - followed by renal here   Not  much to add from cardiac standpoint. Defer diuretic dosing to nephrology, I dont think he is remains significnatly ovelroaded and ongoing SOB more related to COPD. We will sign off inpatient care.   Carlyle Dolly MD

## 2020-12-31 NOTE — Progress Notes (Signed)
Patient called out stating that he feels like his oxygen level is down. Checked O2 saturation which was 93% on 3L. Patient states he would like to have a nebulizer. I called respiratory and she stated patient had one an hour ago and would like to try and wait 2 hours for his next nebulizer. Respiratory said to continue to monitor and let her know if he gets worse between now and then.

## 2020-12-31 NOTE — Progress Notes (Signed)
Received report from ICU nurse Purcell Nails).

## 2021-01-01 DIAGNOSIS — N179 Acute kidney failure, unspecified: Secondary | ICD-10-CM

## 2021-01-01 LAB — CBC
HCT: 45.8 % (ref 39.0–52.0)
Hemoglobin: 14.2 g/dL (ref 13.0–17.0)
MCH: 27.8 pg (ref 26.0–34.0)
MCHC: 31 g/dL (ref 30.0–36.0)
MCV: 89.6 fL (ref 80.0–100.0)
Platelets: 117 10*3/uL — ABNORMAL LOW (ref 150–400)
RBC: 5.11 MIL/uL (ref 4.22–5.81)
RDW: 16.4 % — ABNORMAL HIGH (ref 11.5–15.5)
WBC: 4.9 10*3/uL (ref 4.0–10.5)
nRBC: 0 % (ref 0.0–0.2)

## 2021-01-01 LAB — BASIC METABOLIC PANEL
Anion gap: 12 (ref 5–15)
BUN: 83 mg/dL — ABNORMAL HIGH (ref 8–23)
CO2: 25 mmol/L (ref 22–32)
Calcium: 8.6 mg/dL — ABNORMAL LOW (ref 8.9–10.3)
Chloride: 101 mmol/L (ref 98–111)
Creatinine, Ser: 2.97 mg/dL — ABNORMAL HIGH (ref 0.61–1.24)
GFR, Estimated: 22 mL/min — ABNORMAL LOW (ref >60)
Glucose, Bld: 149 mg/dL — ABNORMAL HIGH (ref 70–99)
Potassium: 4 mmol/L (ref 3.5–5.1)
Sodium: 138 mmol/L (ref 135–145)

## 2021-01-01 LAB — GLUCOSE, CAPILLARY
Glucose-Capillary: 128 mg/dL — ABNORMAL HIGH (ref 70–99)
Glucose-Capillary: 181 mg/dL — ABNORMAL HIGH (ref 70–99)
Glucose-Capillary: 132 mg/dL — ABNORMAL HIGH (ref 70–99)
Glucose-Capillary: 140 mg/dL — ABNORMAL HIGH (ref 70–99)

## 2021-01-01 LAB — BRAIN NATRIURETIC PEPTIDE: B Natriuretic Peptide: 207 pg/mL — ABNORMAL HIGH (ref 0.0–100.0)

## 2021-01-01 MED ORDER — IPRATROPIUM-ALBUTEROL 0.5-2.5 (3) MG/3ML IN SOLN
3.0000 mL | Freq: Four times a day (QID) | RESPIRATORY_TRACT | Status: DC
  Administered 2021-01-02: 3 mL via RESPIRATORY_TRACT
  Filled 2021-01-01: qty 3

## 2021-01-01 MED ORDER — FUROSEMIDE 80 MG PO TABS: 40.0000 mg | ORAL_TABLET | Freq: Every day | ORAL | 1 refills | Status: DC

## 2021-01-01 MED ORDER — ALBUTEROL SULFATE (2.5 MG/3ML) 0.083% IN NEBU
2.5000 mg | INHALATION_SOLUTION | Freq: Four times a day (QID) | RESPIRATORY_TRACT | Status: DC | PRN
  Administered 2021-01-02: 2.5 mg via RESPIRATORY_TRACT
  Filled 2021-01-01: qty 3

## 2021-01-01 MED ORDER — FUROSEMIDE 80 MG PO TABS
80.0000 mg | ORAL_TABLET | Freq: Every day | ORAL | Status: DC
  Administered 2021-01-01 – 2021-01-02 (×2): 80 mg via ORAL
  Filled 2021-01-01 (×2): qty 1

## 2021-01-01 NOTE — Plan of Care (Signed)
  Problem: Acute Rehab PT Goals(only PT should resolve) Goal: Pt Will Go Supine/Side To Sit Outcome: Progressing Flowsheets (Taken 01/01/2021 1417) Pt will go Supine/Side to Sit:  with modified independence  Independently Goal: Patient Will Transfer Sit To/From Stand Outcome: Progressing Flowsheets (Taken 01/01/2021 1417) Patient will transfer sit to/from stand: with modified independence Goal: Pt Will Transfer Bed To Chair/Chair To Bed Outcome: Progressing Flowsheets (Taken 01/01/2021 1417) Pt will Transfer Bed to Chair/Chair to Bed: with modified independence Goal: Pt Will Ambulate Outcome: Progressing Flowsheets (Taken 01/01/2021 1417) Pt will Ambulate:  50 feet  with supervision  with modified independence  with rolling walker   2:18 PM, 01/01/21 [name], MPT Physical Therapist with [facility] 336 [phone] office [phone] mobile phone

## 2021-01-01 NOTE — Discharge Summary (Signed)
Physician Discharge Summary [facility]    Patient: [name] [name]                   Admit date: 12/28/2020   DOB: October 05, 1945             Discharge date:01/01/2021/9:23 AM KVQ:[id number]                          PCP: Nickola Major, MD  Disposition:  SNF  Recommendations for Outpatient Follow-up:     Follow up: PCP in 1 week  Follow-up with nephrologist in 1-2 weeks  Recheck labs including CBC, kidney function in 1 week  Follow-up with cardiologist in 1 to 2 weeks  Will need referral to pulmonologist  Needs to be on restricted free fluid intake 1200-1500/day  Daily weight ... Any increase in weight in 24 - 48 hours 3-to 5 pounds, shortness of breath, extremity edema extra dose of Lasix is warranted with consultation of, heart failure team or PCP  Baseline O2 demand 3 L may increase with exertion     Discharge Condition: Stable   Code Status:   Code Status: Full Code  Diet recommendation: Cardiac diet   Discharge Diagnoses:    Principal Problem:   CHF exacerbation ([place]) Active Problems:   Acute on chronic renal failure (HCC)   Type 2 diabetes mellitus with stage 4 chronic kidney disease ([place])   Hypertension   Hypothyroidism   Acute respiratory failure with hypoxia (HCC)   CHF (congestive heart failure) ([place])   History of Present Illness/ Hospital Course [name] [name] Summary:   [name] a74 y.o.male,with history of type 2 diabetes mellitus, morbid obesity, coronary artery disease, non-Hodgkin's lymphoma, hypothyroidism, hyperlipidemia, essential hypertension, CKD stage III, atrial fibrillation,and more presents the ED with chief complaint of dyspnea.  O2 dependent 3 L at baseline.. Progressively worsening shortness of breath over 2 days  He admits to a cough that is productive of yellow sputum occasionally, noted edema in his legs, missed some home medications  He is vaccinated for COVID. He is full code but does not want long-term  life support.  In the ED  patient was afebrile, heart rate 67-84, respiratory rate 18-22, blood pressure 150/94, satting at 99% on 6 L No leukocytosis, chemistry reveals a creatinine that is at patient's baseline around 2.89 BNP is elevated at 266 Trope was minimally elevated at 28, repeat pending Respiratory panel is negative Chest x-ray shows cardiomegaly with vascular congestion and pulmonary edema with pleural effusions greater on the left than the right and left basilar atelectasis -80 mg of Lasix given in the ED -Solu-Medrol 80 mg given in the ED EKG shows A. fib heart rate 77, QTc 483 Last echo was in December 2021 showed ejection fraction of 50 to 55% with grade 2 diastolic dysfunction     1. Acute respiratory failure with hypoxia 1. Stable back to baseline 3 L of oxygen satting  > 92 % 2. Based on presentation likely CHF exacerbation, with underlying COPD 3. With successfully weaned off BiPAP, to 6 L oxygen this morning 3 L of oxygen, satting 95% 4. On admission was satting 83% on 3 L 5. On admission: wason BiPAP with O2 rate of 12, FiO2 of 60%, ABG 7.24/PCO2 65.3/PO2 of 74.3 6. Res panel Negative 7. CXR indicative of CHF 8. Continue Solu-Medrol, DuoNeb bronchodilatorbreathing treatments scheduled 9. Tapering down Solu-Medrol>>> switch to p.o. prednisone with taper 10.  D/Ced IV Lasix  due to elevated BUN/creatinine, per nephrologist recommendation resumed Lasix at lower dose p.o.  2. CHF exacerbation 1. Monitoring daily weight, labs proBNP (241, 180, 199,) 2. CXR with cardiomegaly and vascular congestion 3. Mildly elevated troponin, consistent with ischemic demand recycling troponins 28, 31 4. We will continue to hold Lasix--as worsening BUN/creatinine 65/3.27 respectively 5. We will monitor daily weight, I's and O's 6. Fluid restriction 7. We will continue home medications of asa, imdur, and statin 8. Last weight 147.2 kg (320 lb 12.3  oz 3. Hypokalemia 1. Potassium 3.4, 3.7, 4.5,4.3 2. K+ replace and recheck 3. Monitoring 4. HTN 1. Continue amlodipine 2. Mildly elevated,stable 5. DMII 1. Checking CBG QA CHS, SSI coverage 2. Continue long-acting insulin Tresiba 3. Anticipating hyperglycemia due to steroids 6. Hypothyroidism 1. Continue synthroid 2. Stable 7. Afib 1. Continue Eliquis 2. Remained stable 8. Toe injury 1. X-ray right foot-reviewed negative for any abnormality or fracture 2. As needed analgesics  9. Acute on chronic kidney disease stage IIIa 1. Possible cardiorenal syndrome BUN 39, 40, creatinine 2.89, 2.82 >>>3.10>> ,3.27 2. We will holding high intensity Lasix, will initiate tomorrow with low-dose 3. Consulting nephrology for further evaluation recommendation possible developing cardiorenal Syndrome >>>> patient has diuresed well over 2 L on this admission, recommend would hold Lasix due to elevated BUN/creatinine   ------------------------------------------------------------------------------------------------------------------------------------- Nutritional status:  The patient's BMI is:Body mass index is 42.32 kg/m. I agree with the assessment and plan as outlined  Consultants: card/nephrology   -------------------------------------------------------------------------------------------------------------------------------------------- Code Status:Code Status: Full Code  Family Communication:Patient is alert and oriented x4, discussed with patient in detail The above findings and plan of care has been discussed with patient (and family) in detail,  they expressed understanding and agreement of above. -Advance care planning has been discussed.   Dispo: The patient is from: Home Anticipated d/c is to:SNF       Discharge Instructions:   Discharge Instructions    (HEART FAILURE PATIENTS) Call MD:   Anytime you have any of the following symptoms: 1) 3 pound weight gain in 24 hours or 5 pounds in 1 week 2) shortness of breath, with or without a dry hacking cough 3) swelling in the hands, feet or stomach 4) if you have to sleep on extra pillows at night in order to breathe.   Complete by: As directed    (HEART FAILURE PATIENTS) Call MD:  Anytime you have any of the following symptoms: 1) 3 pound weight gain in 24 hours or 5 pounds in 1 week 2) shortness of breath, with or without a dry hacking cough 3) swelling in the hands, feet or stomach 4) if you have to sleep on extra pillows at night in order to breathe.   Complete by: As directed    AMB referral to CHF clinic   Complete by: As directed    Activity as tolerated - No restrictions   Complete by: As directed    Activity as tolerated - No restrictions   Complete by: As directed    Call MD for:  difficulty breathing, headache or visual disturbances   Complete by: As directed    Call MD for:  temperature >100.4   Complete by: As directed    Diet - low sodium heart healthy   Complete by: As directed    Discharge instructions   Complete by: As directed    F/up with neurologist nephrologist in 1 week. Kidney function to be evaluated, check with labs in 1 week   Increase activity  slowly   Complete by: As directed    Increase activity slowly   Complete by: As directed        Medication List    TAKE these medications   acetaminophen 325 MG tablet Commonly known as: TYLENOL Take 2 tablets (650 mg total) by mouth every 6 (six) hours as needed for mild pain (or Fever >/= 101).   albuterol 108 (90 Base) MCG/ACT inhaler Commonly known as: VENTOLIN HFA Inhale 1-2 puffs into the lungs every 6 (six) hours as needed for wheezing or shortness of breath.   amLODipine 10 MG tablet Commonly known as: NORVASC Take 10 mg by mouth daily.   aspirin EC 81 MG tablet Take 81 mg by mouth daily. Swallow whole.   bisoprolol 5 MG tablet Commonly  known as: ZEBETA Take 5 mg by mouth daily.   chlorhexidine 0.12 % solution Commonly known as: PERIDEX 15 mLs by Mouth Rinse route 2 (two) times daily.   Eliquis 5 MG Tabs tablet Generic drug: apixaban Take 1 tablet (5 mg total) by mouth 2 (two) times daily. This is a dose change   fluticasone 50 MCG/ACT nasal spray Commonly known as: FLONASE Place 2 sprays into both nostrils as needed for up to 14 days.   furosemide 80 MG tablet Commonly known as: LASIX Take 0.5 tablets (40 mg total) by mouth daily. What changed:   how much to take  when to take this  reasons to take this   insulin aspart 100 UNIT/ML injection Commonly known as: novoLOG Inject 0-15 Units into the skin 3 (three) times daily with meals.   insulin degludec 100 UNIT/ML FlexTouch Pen Commonly known as: TRESIBA Inject 59 Units into the skin daily at 10 pm.   ipratropium-albuterol 0.5-2.5 (3) MG/3ML Soln Commonly known as: DUONEB Inhale 3 mLs into the lungs every 4 (four) hours as needed (shortness of breath).   isosorbide mononitrate 30 MG 24 hr tablet Commonly known as: IMDUR Take 1 tablet (30 mg total) by mouth daily.   levocetirizine 5 MG tablet Commonly known as: XYZAL Take 5 mg by mouth every evening.   levothyroxine 200 MCG tablet Commonly known as: SYNTHROID Take 300 mcg by mouth daily before breakfast. (takes with 124mcg for a total of 351mcg) What changed: Another medication with the same name was removed. Continue taking this medication, and follow the directions you see here.   methylPREDNISolone 4 MG Tbpk tablet Commonly known as: MEDROL DOSEPAK Medrol Dosepak take as instructed   mouth rinse Liqd solution 15 mLs by Mouth Rinse route 2 times daily at 12 noon and 4 pm.   nitroGLYCERIN 0.4 MG SL tablet Commonly known as: NITROSTAT Place 0.4 mg under the tongue every 5 (five) minutes as needed for chest pain.   nystatin powder Commonly known as: MYCOSTATIN/NYSTOP Apply topically 2  (two) times daily.   OXYGEN Inhale 3 L into the lungs daily as needed.   potassium chloride SA 20 MEQ tablet Commonly known as: KLOR-CON Take 1 tablet (20 mEq total) by mouth 2 (two) times daily.   senna-docusate 8.6-50 MG tablet Commonly known as: Senokot-S Take 1 tablet by mouth daily as needed for mild constipation.   simvastatin 5 MG tablet Commonly known as: ZOCOR Take 5 mg by mouth daily.   Symbicort 160-4.5 MCG/ACT inhaler Generic drug: budesonide-formoterol Inhale 2 puffs into the lungs daily as needed (shortness of breath).       No Known Allergies   Procedures /Studies:   DG Chest Portable  1 View  Result Date: 12/28/2020 CLINICAL DATA:  Shortness of breath EXAM: PORTABLE CHEST 1 VIEW COMPARISON:  11/19/2020 FINDINGS: Cardiomegaly with vascular congestion and diffuse interstitial opacity consistent with pulmonary edema. Small left greater than right pleural effusion. Basilar airspace disease on the left. Aortic atherosclerosis. No pneumothorax IMPRESSION: 1. Cardiomegaly with vascular congestion and pulmonary edema. 2. Small left greater than right pleural effusions with left basilar atelectasis or pneumonia. Similar findings on 11/19/2020 comparison Electronically Signed   By: Donavan Foil M.D.   On: 12/28/2020 03:20   DG Toe 3rd Right  Result Date: 12/28/2020 CLINICAL DATA:  Right third toe injury EXAM: RIGHT THIRD TOE COMPARISON:  None. FINDINGS: There is no evidence of fracture or dislocation. There is no evidence of arthropathy or other focal bone abnormality. Soft tissues are unremarkable. IMPRESSION: Negative. Electronically Signed   By: Monte Fantasia M.D.   On: 12/28/2020 05:39   ECHOCARDIOGRAM COMPLETE  Result Date: 12/28/2020    ECHOCARDIOGRAM REPORT   Patient Name:   KVION SHAPLEY Date of Exam: 12/28/2020 Medical Rec #:  [id number]     Height:       73.0 in Accession #:    [id number]    Weight:       308.6 lb Date of Birth:  Dec 18, 1945     BSA:           2.587 m Patient Age:    12 years      BP:           146/83 mmHg Patient Gender: M             HR:           75 bpm. Exam Location:  [facility] Procedure: 2D Echo, Cardiac Doppler and Color Doppler Indications:    CHF  History:        Patient has prior history of Echocardiogram examinations, most                 recent 07/20/2020. CAD, Arrythmias:LBBB and Atrial Fibrillation;                 Risk Factors:Diabetes, Dyslipidemia, Hypertension and Current                 Smoker.  Sonographer:    Maudry Mayhew MHA, RDMS, RVT, RDCS Referring Phys: [id number] ASIA B [place]  Sonographer Comments: Suboptimal subcostal window. Image acquisition challenging due to patient body habitus, Image acquisition challenging due to respiratory motion and on CPAP. IMPRESSIONS  1. Left ventricular ejection fraction, by estimation, is 55 to 60%. The left ventricle has normal function. The left ventricle has no regional wall motion abnormalities. The left ventricular internal cavity size was moderately dilated. Left ventricular diastolic function could not be evaluated.  2. Right ventricular systolic function is normal. The right ventricular size is mildly enlarged. There is moderately elevated pulmonary artery systolic pressure.  3. Left atrial size was mildly dilated.  4. Right atrial size was moderately dilated.  5. The mitral valve is normal in structure. Trivial mitral valve regurgitation. No evidence of mitral stenosis.  6. The aortic valve is tricuspid. Aortic valve regurgitation is not visualized. Mild aortic valve sclerosis is present, with no evidence of aortic valve stenosis.  7. The inferior vena cava is dilated in size with >50% respiratory variability, suggesting right atrial pressure of 8 mmHg. FINDINGS  Left Ventricle: Left ventricular ejection fraction, by estimation, is 55 to 60%. The left ventricle has normal function.  The left ventricle has no regional wall motion abnormalities. Definity contrast agent was given  IV to delineate the left ventricular  endocardial borders. The left ventricular internal cavity size was moderately dilated. There is no left ventricular hypertrophy. Left ventricular diastolic function could not be evaluated due to atrial fibrillation. Left ventricular diastolic function could not be evaluated. Right Ventricle: The right ventricular size is mildly enlarged.Right ventricular systolic function is normal. There is moderately elevated pulmonary artery systolic pressure. The tricuspid regurgitant velocity is 3.54 m/s, and with an assumed right atrial pressure of 8 mmHg, the estimated right ventricular systolic pressure is 37.9 mmHg. Left Atrium: Left atrial size was mildly dilated. Right Atrium: Right atrial size was moderately dilated. Pericardium: There is no evidence of pericardial effusion. Mitral Valve: The mitral valve is normal in structure. Trivial mitral valve regurgitation. No evidence of mitral valve stenosis. Tricuspid Valve: The tricuspid valve is normal in structure. Tricuspid valve regurgitation is trivial. No evidence of tricuspid stenosis. Aortic Valve: The aortic valve is tricuspid. Aortic valve regurgitation is not visualized. Mild aortic valve sclerosis is present, with no evidence of aortic valve stenosis. Aortic valve mean gradient measures 5.0 mmHg. Aortic valve peak gradient measures 10.2 mmHg. Aortic valve area, by VTI measures 1.90 cm. Pulmonic Valve: The pulmonic valve was not well visualized. Pulmonic valve regurgitation is not visualized. No evidence of pulmonic stenosis. Aorta: The aortic root is normal in size and structure. Venous: The inferior vena cava is dilated in size with greater than 50% respiratory variability, suggesting right atrial pressure of 8 mmHg.   LEFT VENTRICLE PLAX 2D LVIDd:         6.30 cm LVIDs:         4.80 cm LV PW:         0.70 cm LV IVS:        0.90 cm LVOT diam:     2.00 cm LV SV:         60 LV SV Index:   23 LVOT Area:     3.14 cm  LV Volumes  (MOD) LV vol d, MOD A2C: 186.0 ml LV vol d, MOD A4C: 153.0 ml LV vol s, MOD A2C: 71.4 ml LV vol s, MOD A4C: 104.0 ml LV SV MOD A2C:     114.6 ml LV SV MOD A4C:     153.0 ml LV SV MOD BP:      88.7 ml RIGHT VENTRICLE TAPSE (M-mode): 1.7 cm LEFT ATRIUM              Index       RIGHT ATRIUM           Index LA diam:        4.10 cm  1.58 cm/m  RA Area:     32.20 cm LA Vol (A2C):   134.0 ml 51.79 ml/m RA Volume:   137.00 ml 52.95 ml/m LA Vol (A4C):   58.3 ml  22.53 ml/m LA Biplane Vol: 90.1 ml  34.82 ml/m  AORTIC VALVE AV Area (Vmax):    1.84 cm AV Area (Vmean):   1.73 cm AV Area (VTI):     1.90 cm AV Vmax:           160.00 cm/s AV Vmean:          107.000 cm/s AV VTI:            0.315 m AV Peak Grad:      10.2 mmHg AV Mean Grad:  5.0 mmHg LVOT Vmax:         93.60 cm/s LVOT Vmean:        58.800 cm/s LVOT VTI:          0.191 m LVOT/AV VTI ratio: 0.61  AORTA Ao Root diam: 3.40 cm TRICUSPID VALVE TR Peak grad:   50.1 mmHg TR Vmax:        354.00 cm/s  SHUNTS Systemic VTI:  0.19 m Systemic Diam: 2.00 cm Kirk Ruths MD Electronically signed by Kirk Ruths MD Signature Date/Time: 12/28/2020/12:18:15 PM    Final     Subjective:   Patient was seen and examined 01/01/2021, 9:23 AM Patient stable today. No acute distress.  No issues overnight Stable for discharge.  Discharge Exam:    Vitals:   01/01/21 0558 01/01/21 0615 01/01/21 0725 01/01/21 0730  BP: (!) 145/96     Pulse: 92     Resp: 18     Temp: (!) 97.5 F (36.4 C)     TempSrc: Oral     SpO2: 93%  92% 92%  Weight:  (!) 147.2 kg    Height:        General: Pt lying comfortably in bed & appears in no obvious distress. Cardiovascular: S1 & S2 heard, RRR, S1/S2 +. No murmurs, rubs, gallops or clicks. No JVD or pedal edema. Respiratory: Clear to auscultation without wheezing, rhonchi or crackles. No increased work of breathing. Abdominal:  Non-distended, non-tender & soft. No organomegaly or masses appreciated. Normal bowel sounds  heard. CNS: Alert and oriented. No focal deficits. Extremities: no edema, no cyanosis      The results of significant diagnostics from this hospitalization (including imaging, microbiology, ancillary and laboratory) are listed below for reference.      Microbiology:   Recent Results (from the past 240 hour(s))  Resp Panel by RT-PCR (Flu A&B, Covid) Nasopharyngeal Swab     Status: None   Collection Time: 12/28/20  2:49 AM   Specimen: Nasopharyngeal Swab; Nasopharyngeal(NP) swabs in vial transport medium  Result Value Ref Range Status   SARS Coronavirus 2 by RT PCR NEGATIVE NEGATIVE Final    Comment: (NOTE) SARS-CoV-2 target nucleic acids are NOT DETECTED.  The SARS-CoV-2 RNA is generally detectable in upper respiratory specimens during the acute phase of infection. The lowest concentration of SARS-CoV-2 viral copies this assay can detect is 138 copies/mL. A negative result does not preclude SARS-Cov-2 infection and should not be used as the sole basis for treatment or other patient management decisions. A negative result may occur with  improper specimen collection/handling, submission of specimen other than nasopharyngeal swab, presence of viral mutation(s) within the areas targeted by this assay, and inadequate number of viral copies(<138 copies/mL). A negative result must be combined with clinical observations, patient history, and epidemiological information. The expected result is Negative.  Fact Sheet for Patients:  EntrepreneurPulse.com.au  Fact Sheet for Healthcare Providers:  IncredibleEmployment.be  This test is no t yet approved or cleared by the [place] FDA and  has been authorized for detection and/or diagnosis of SARS-CoV-2 by FDA under an Emergency Use Authorization (EUA). This EUA will remain  in effect (meaning this test can be used) for the duration of the COVID-19 declaration under Section 564(b)(1) of the Act,  21 U.S.C.section 360bbb-3(b)(1), unless the authorization is terminated  or revoked sooner.       Influenza A by PCR NEGATIVE NEGATIVE Final   Influenza B by PCR NEGATIVE NEGATIVE Final    Comment: (NOTE)  The Xpert Xpress SARS-CoV-2/FLU/RSV plus assay is intended as an aid in the diagnosis of influenza from Nasopharyngeal swab specimens and should not be used as a sole basis for treatment. Nasal washings and aspirates are unacceptable for Xpert Xpress SARS-CoV-2/FLU/RSV testing.  Fact Sheet for Patients: EntrepreneurPulse.com.au  Fact Sheet for Healthcare Providers: IncredibleEmployment.be  This test is not yet approved or cleared by the [place] FDA and has been authorized for detection and/or diagnosis of SARS-CoV-2 by FDA under an Emergency Use Authorization (EUA). This EUA will remain in effect (meaning this test can be used) for the duration of the COVID-19 declaration under Section 564(b)(1) of the Act, 21 U.S.C. section 360bbb-3(b)(1), unless the authorization is terminated or revoked.  Performed at [facility], [address] [place], [place] [address]   MRSA PCR Screening     Status: None   Collection Time: 12/28/20 10:00 PM   Specimen: Nasal Mucosa; Nasopharyngeal  Result Value Ref Range Status   MRSA by PCR NEGATIVE NEGATIVE Final    Comment:        The GeneXpert MRSA Assay (FDA approved for NASAL specimens only), is one component of a comprehensive MRSA colonization surveillance program. It is not intended to diagnose MRSA infection nor to guide or monitor treatment for MRSA infections. Performed at [facility], [address] [place], [place] [address]      Labs:   CBC: Recent Labs  Lab 12/28/20 0236 12/28/20 0550 12/29/20 0519 12/30/20 0529 12/31/20 0421 01/01/21 0547  WBC 5.9 6.0 5.6 6.1 7.2 4.9  NEUTROABS 4.3  --   --   --   --   --   HGB 14.3 15.7 13.6 14.2 14.5 14.2  HCT 46.4 51.9 44.9 45.1  46.4 45.8  MCV 92.2 92.2 92.8 90.7 91.0 89.6  PLT 127* 126* 127* 134* 123* 517*   Basic Metabolic Panel: Recent Labs  Lab 12/28/20 0550 12/29/20 0519 12/30/20 0529 12/31/20 0421 01/01/21 0547  NA 139 137 138 137 138  K 3.7 4.5 4.3 4.2 4.0  CL 101 101 102 101 101  CO2 29 26 25 23 25   GLUCOSE 128* 182* 146* 163* 149*  BUN 40* 51* 65* 80* 83*  CREATININE 2.82* 3.10* 3.27* 3.21* 2.97*  CALCIUM 9.0 8.4* 8.6* 8.6* 8.6*  MG 2.3  --   --   --   --    Liver Function Tests: Recent Labs  Lab 12/28/20 0236 12/28/20 0550  AST 13* 13*  ALT 8 8  ALKPHOS 91 98  BILITOT 1.0 1.0  PROT 7.5 8.0  ALBUMIN 4.0 4.3   BNP (last 3 results) Recent Labs    12/30/20 0529 12/31/20 0421 01/01/21 0547  BNP 180.0* 199.0* 207.0*   Cardiac Enzymes: No results for input(s): CKTOTAL, CKMB, CKMBINDEX, TROPONINI in the last 168 hours. CBG: Recent Labs  Lab 12/31/20 0742 12/31/20 1122 12/31/20 1629 12/31/20 2142 01/01/21 0736  GLUCAP 152* 128* 152* 259* 128*   Hgb A1c No results for input(s): HGBA1C in the last 72 hours. Lipid Profile No results for input(s): CHOL, HDL, LDLCALC, TRIG, CHOLHDL, LDLDIRECT in the last 72 hours. Thyroid function studies No results for input(s): TSH, T4TOTAL, T3FREE, THYROIDAB in the last 72 hours.  Invalid input(s): FREET3 Anemia work up No results for input(s): VITAMINB12, FOLATE, FERRITIN, TIBC, IRON, RETICCTPCT in the last 72 hours. Urinalysis    Component Value Date/Time   COLORURINE YELLOW 11/19/2020 1736   APPEARANCEUR CLEAR 11/19/2020 1736   LABSPEC 1.015 11/19/2020 1736  PHURINE 6.0 11/19/2020 1736   GLUCOSEU NEGATIVE 11/19/2020 1736   HGBUR NEGATIVE 11/19/2020 1736   BILIRUBINUR NEGATIVE 11/19/2020 1736   KETONESUR NEGATIVE 11/19/2020 1736   PROTEINUR >=300 (A) 11/19/2020 1736   UROBILINOGEN 4.0 (H) 07/23/2014 1356   NITRITE NEGATIVE 11/19/2020 1736   LEUKOCYTESUR NEGATIVE 11/19/2020 1736         Time coordinating discharge: Over 45  minutes  SIGNED: Deatra James, MD, FACP, FHM. [facility],  Please use [email or web address] to Page If 7PM-7AM, please contact night-coverage [email or web address] 01/01/2021, 9:23 AM

## 2021-01-01 NOTE — TOC Progression Note (Addendum)
Transition of Care [facility]) - Progression Note    Patient Details  Name: [name] [name] MRN: [id number] Date of Birth: 10-Nov-1945  Transition of Care [facility]) CM/SW Contact  Natasha Bence, LCSW Phone Number: 01/01/2021, 5:28 PM  Clinical Narrative:    CSW started auth for patient. Patient agreeable to [facility]. CSW obtained patient's vaccination records. Patient has not received booster for Covid 19. Ebony Hail with [facility] reported that they do not have an available Quarantine bed. CSW inquired if patient is agreeable to [place] bed offer. Patient agreeable to [place]. Debbie reported that a quarantine bed is available. CSW waiting on insurance auth. TOC to follow.    Expected Discharge Plan: Skilled Nursing Facility Barriers to Discharge: Continued Medical Work up  Expected Discharge Plan and Services Expected Discharge Plan: [place] arrangements for the past 2 months: Single Family Home Expected Discharge Date: 01/01/21                                     Social Determinants of Health (SDOH) Interventions    Readmission Risk Interventions Readmission Risk Prevention Plan 12/29/2020 10/07/2020 08/05/2020  Transportation Screening Complete Complete Complete  Medication Review [employer] photographer) Complete Complete Complete  PCP or Specialist appointment within 3-5 days of discharge Complete - -  HRI or Home Care Consult Complete Complete Complete  SW Recovery Care/Counseling Consult Complete Complete Complete  Palliative Care Screening Not Applicable Not Applicable Not Applicable  Skilled Nursing Facility Complete Not Applicable Not Applicable  Some recent data might be hidden

## 2021-01-01 NOTE — Progress Notes (Addendum)
[facility] ROUNDING NOTE   Subjective:   Brief history: This 63 gentleman hypertension hyperlipidemia diabetes obesity non-Hodgkin's lymphoma status postradiation chemotherapy.  History of peripheral neuropathy CKD followed in [facility].  He has presumed diabetic nephropathy.  He presented with shortness of breath and fevers probably combination of congestive heart failure and COPD.  Baseline creatinine appears to be about 3 mg/dL per patient.  Patient seen by Dr. Carolin Sicks who recommends discontinuation of IV diuretics    Blood pressure 145/96 pulse 92 temperature 97.5 O2 sats 93% nasal cannula 3 L  Labs pending 01/01/2021 last set of labs showed creatinine 3.21 potassium 4.2 CO2 23 sodium 137 calcium 8.6 hemoglobin 14.5  Amlodipine 10 mg daily, Eliquis 5 mg daily aspirin 81 mg daily budesonide twice daily, insulin Levemir 45 units, DuoNeb every 4 hours, isosorbide 30 mg daily, levothyroxine 300 mcg daily, Solu-Medrol 60 mg every 12, simvastatin 5 mg daily.  Urine output 1100 cc 12/31/2020  2D echo 55% ejection fraction with mild aortic sclerosis and mildly enlarged right ventricle  Outpatient medications included 80 mg daily as needed Lasix for lower extremity edema      Objective:  Vital signs in last 24 hours:  Temp:  [97.3 F (36.3 C)-98.3 F (36.8 C)] 97.5 F (36.4 C) (06/01 0558) Pulse Rate:  [77-95] 92 (06/01 0558) Resp:  [18-20] 18 (06/01 0558) BP: (130-157)/(67-96) 145/96 (06/01 0558) SpO2:  [81 %-97 %] 93 % (06/01 0558) FiO2 (%):  [99 %] 99 % (05/31 0757) Weight:  [147.2 kg] 147.2 kg (06/01 0615)  Weight change:  Filed Weights   12/29/20 0500 12/30/20 0410 01/01/21 0615  Weight: (!) 144.6 kg (!) 145.5 kg (!) 147.2 kg    Intake/Output: I/O last 3 completed shifts: In: 2400 [P.O.:2400] Out: 2525 [Urine:2525]   Intake/Output this shift:  Total I/O In: 480 [P.O.:480] Out: 600 [Urine:600]  General:NAD, comfortable Heart:RRR, s1s2 nl, no  rubs Lungs:clear b/l, no crackle Abdomen:soft, Non-tender, non-distended Extremities: Chronic stasis changes, no pitting edema Neurology: Alert, awake and following commands.  No asterixis  Basic Metabolic Panel: Recent Labs  Lab 12/28/20 0236 12/28/20 0550 12/29/20 0519 12/30/20 0529 12/31/20 0421  NA 139 139 137 138 137  K 3.4* 3.7 4.5 4.3 4.2  CL 102 101 101 102 101  CO2 30 29 26 25 23   GLUCOSE 128* 128* 182* 146* 163*  BUN 39* 40* 51* 65* 80*  CREATININE 2.89* 2.82* 3.10* 3.27* 3.21*  CALCIUM 8.9 9.0 8.4* 8.6* 8.6*  MG  --  2.3  --   --   --     Liver Function Tests: Recent Labs  Lab 12/28/20 0236 12/28/20 0550  AST 13* 13*  ALT 8 8  ALKPHOS 91 98  BILITOT 1.0 1.0  PROT 7.5 8.0  ALBUMIN 4.0 4.3   No results for input(s): LIPASE, AMYLASE in the last 168 hours. No results for input(s): AMMONIA in the last 168 hours.  CBC: Recent Labs  Lab 12/28/20 0236 12/28/20 0550 12/29/20 0519 12/30/20 0529 12/31/20 0421  WBC 5.9 6.0 5.6 6.1 7.2  NEUTROABS 4.3  --   --   --   --   HGB 14.3 15.7 13.6 14.2 14.5  HCT 46.4 51.9 44.9 45.1 46.4  MCV 92.2 92.2 92.8 90.7 91.0  PLT 127* 126* 127* 134* 123*    Cardiac Enzymes: No results for input(s): CKTOTAL, CKMB, CKMBINDEX, TROPONINI in the last 168 hours.  BNP: Invalid input(s): POCBNP  CBG: Recent Labs  Lab 12/30/20 2157  12/31/20 0742 12/31/20 1122 12/31/20 1629 12/31/20 2142  GLUCAP 187* 152* 128* 152* 259*    Microbiology: Results for orders placed or performed during the hospital encounter of 12/28/20  Resp Panel by RT-PCR (Flu A&B, Covid) Nasopharyngeal Swab     Status: None   Collection Time: 12/28/20  2:49 AM   Specimen: Nasopharyngeal Swab; Nasopharyngeal(NP) swabs in vial transport medium  Result Value Ref Range Status   SARS Coronavirus 2 by RT PCR NEGATIVE NEGATIVE Final    Comment: (NOTE) SARS-CoV-2 target nucleic acids are NOT DETECTED.  The SARS-CoV-2 RNA is generally detectable in upper  respiratory specimens during the acute phase of infection. The lowest concentration of SARS-CoV-2 viral copies this assay can detect is 138 copies/mL. A negative result does not preclude SARS-Cov-2 infection and should not be used as the sole basis for treatment or other patient management decisions. A negative result may occur with  improper specimen collection/handling, submission of specimen other than nasopharyngeal swab, presence of viral mutation(s) within the areas targeted by this assay, and inadequate number of viral copies(<138 copies/mL). A negative result must be combined with clinical observations, patient history, and epidemiological information. The expected result is Negative.  Fact Sheet for Patients:  EntrepreneurPulse.com.au  Fact Sheet for Healthcare Providers:  IncredibleEmployment.be  This test is no t yet approved or cleared by the [place] FDA and  has been authorized for detection and/or diagnosis of SARS-CoV-2 by FDA under an Emergency Use Authorization (EUA). This EUA will remain  in effect (meaning this test can be used) for the duration of the COVID-19 declaration under Section 564(b)(1) of the Act, 21 U.S.C.section 360bbb-3(b)(1), unless the authorization is terminated  or revoked sooner.       Influenza A by PCR NEGATIVE NEGATIVE Final   Influenza B by PCR NEGATIVE NEGATIVE Final    Comment: (NOTE) The Xpert Xpress SARS-CoV-2/FLU/RSV plus assay is intended as an aid in the diagnosis of influenza from Nasopharyngeal swab specimens and should not be used as a sole basis for treatment. Nasal washings and aspirates are unacceptable for Xpert Xpress SARS-CoV-2/FLU/RSV testing.  Fact Sheet for Patients: EntrepreneurPulse.com.au  Fact Sheet for Healthcare Providers: IncredibleEmployment.be  This test is not yet approved or cleared by the [place] FDA and has been  authorized for detection and/or diagnosis of SARS-CoV-2 by FDA under an Emergency Use Authorization (EUA). This EUA will remain in effect (meaning this test can be used) for the duration of the COVID-19 declaration under Section 564(b)(1) of the Act, 21 U.S.C. section 360bbb-3(b)(1), unless the authorization is terminated or revoked.  Performed at [facility], [address], [place], [place] [address]   MRSA PCR Screening     Status: None   Collection Time: 12/28/20 10:00 PM   Specimen: Nasal Mucosa; Nasopharyngeal  Result Value Ref Range Status   MRSA by PCR NEGATIVE NEGATIVE Final    Comment:        The GeneXpert MRSA Assay (FDA approved for NASAL specimens only), is one component of a comprehensive MRSA colonization surveillance program. It is not intended to diagnose MRSA infection nor to guide or monitor treatment for MRSA infections. Performed at [facility], [address], [place], [place] [address]     Coagulation Studies: No results for input(s): LABPROT, INR in the last 72 hours.  Urinalysis: No results for input(s): COLORURINE, LABSPEC, PHURINE, GLUCOSEU, HGBUR, BILIRUBINUR, KETONESUR, PROTEINUR, UROBILINOGEN, NITRITE, LEUKOCYTESUR in the last 72 hours.  Invalid input(s): APPERANCEUR    Imaging: No  results found.   Medications:    . amLODipine  10 mg Oral Daily  . apixaban  5 mg Oral BID  . aspirin EC  81 mg Oral Daily  . budesonide (PULMICORT) nebulizer solution  0.25 mg Nebulization BID  . chlorhexidine  15 mL Mouth Rinse BID  . Chlorhexidine Gluconate Cloth  6 each Topical Daily  . insulin aspart  0-15 Units Subcutaneous TID WC  . insulin aspart  0-5 Units Subcutaneous QHS  . insulin detemir  45 Units Subcutaneous QHS  . ipratropium-albuterol  3 mL Inhalation Q4H  . isosorbide mononitrate  30 mg Oral Daily  . levothyroxine  300 mcg Oral Q0600  . mouth rinse  15 mL Mouth Rinse q12n4p  . methylPREDNISolone (SOLU-MEDROL) injection  60 mg  Intravenous Q12H  . 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 He presented with shortness of breath which was probably combination of mild CHF and COPD. He received multiple doses of IV Lasix with uptrending BUN and creatinine level today. He does not look overtly volume up on exam.  The Lasix was held yesterday and he had a urine output of around 2 L.  Creatinine level stable today.  Would reinstitute oral Lasix 80 mg daily.  #Shortness of breath/chronic hypoxicrespiratory failure/COPD: He is currently using 3L of oxygen which is his home dose. Received multiple doses of IV diuretics for CHF as discussed above. Continue bronchodilators, steroid.  #Heart failure with preserved EF: Complicated by CKD. Only minimal elevation of BNP. Diuretics as discussed above.  #Hypertension: Resume home antihypertensive medication including amlodipine, isosorbide. Monitor BP. Daily evaluation for diuretics need.  #Atrial fibrillation: On Eliquis. Monitor heart rate.    LOS: [facility] @TODAY @6 :27 AM

## 2021-01-01 NOTE — Care Management Important Message (Signed)
Important Message  Patient Details  Name: [name] [name] MRN: [id number] Date of Birth: August 04, 1945   Medicare Important Message Given:  Yes     Tommy Medal 01/01/2021, 12:42 PM

## 2021-01-01 NOTE — Evaluation (Signed)
Physical Therapy Evaluation Patient Details Name: [name] [name] MRN: [id number] DOB: [birth year] Today's Date: 01/01/2021   History of Present Illness  [name] [name]  is a 75 y.o. male, with history of type 2 diabetes mellitus, morbid obesity, coronary artery disease, non-Hodgkin's lymphoma, hypothyroidism, hyperlipidemia, essential hypertension, CKD stage III, atrial fibrillation, and more presents the ED with chief complaint of dyspnea.  Patient reports that the dyspnea started 2 days ago gradual onset, progressively worse, and then acutely worse this morning.  He wears 3 L nasal cannula at baseline, and did not attempt to change the oxygen supplementation at home.  He reports that was worse with exertion, better with rest, but he cannot lay flat.  He denies chest pain or palpitations.  He admits to a cough that is productive of yellow sputum occasionally.  He admits to increased fluid retention in his legs as well.  He reports that he follows a low-sodium diet, but that he has missed some of his medication doses.  Patient reports he has noticed a decrease in urine output at home.  Patient has no other complaints at this time.    Clinical Impression  Patient presents up in chair and agreeable for therapy.  Patient demonstrates slightly labored movement during transfer to commode in bathroom and getting in/out of bed without loss of balance, limited mostly due to severe SOB with SpO2 dropping from 93% to 80% with exertion, required approximately 4-5 minutes for SpO2 to increased to 92% while seated at bedside.  Patient tolerated sitting up in chair after therapy.  Patient will benefit from continued physical therapy in hospital and recommended venue below to increase strength, balance, endurance for safe ADLs and gait.     Follow Up Recommendations SNF;Supervision for mobility/OOB;Supervision - Intermittent    Equipment Recommendations  None recommended by PT    Recommendations for Other Services        Precautions / Restrictions Precautions Precautions: Fall Restrictions Weight Bearing Restrictions: No      Mobility  Bed Mobility Overal bed mobility: Modified Independent             General bed mobility comments: increased time    Transfers Overall transfer level: Needs assistance Equipment used: Rolling walker (2 wheeled) Transfers: Sit to/from [facility] Sit to Stand: Min guard Stand pivot transfers: Min guard          Ambulation/Gait Ambulation/Gait assistance: Min guard Gait Distance (Feet): 16 Feet Assistive device: Rolling walker (2 wheeled) Gait Pattern/deviations: Decreased step length - right;Decreased step length - left;Decreased stride length Gait velocity: decreased   General Gait Details: slow labored cadence without loss of balance, limited mostly due to SOB with SpO2 dropping from 93% to 80% with exertion  Stairs            Wheelchair Mobility    Modified Rankin (Stroke Patients Only)       Balance Overall balance assessment: Needs assistance Sitting-balance support: Feet supported;No upper extremity supported Sitting balance-Leahy Scale: Good Sitting balance - Comments: seated at EOB   Standing balance support: During functional activity;Bilateral upper extremity supported Standing balance-Leahy Scale: Fair Standing balance comment: fair/good using RW                             Pertinent Vitals/Pain Pain Assessment: Faces Faces Pain Scale: Hurts a little bit Pain Location: BLE Pain Descriptors / Indicators: Sore Pain Intervention(s): Limited activity within patient's tolerance;Monitored during session  Home Living Family/patient expects to be discharged to:: Private residence Living Arrangements: Children;Other relatives Available Help at Discharge: Family;Available 24 hours/day Type of Home: House Home Access: Ramped entrance;Stairs to enter   Entrance Stairs-Number of Steps:  1 Home Layout: One level Home Equipment: Shower seat;Bedside commode;Walker - 2 wheels;Cane - single point;Toilet riser;Electric scooter;Walker - 4 wheels      Prior Function Level of Independence: Needs assistance   Gait / Transfers Assistance Needed: household ambulator using Rollator or RW, on 3 LPM home O2 constant, uses electric scooter for longer distances  ADL's / Homemaking Assistance Needed: assisted by family for community ADLs        Hand Dominance   Dominant Hand: Right    Extremity/Trunk Assessment   Upper Extremity Assessment Upper Extremity Assessment: Generalized weakness    Lower Extremity Assessment Lower Extremity Assessment: Generalized weakness    Cervical / Trunk Assessment Cervical / Trunk Assessment: Kyphotic  Communication   Communication: No difficulties  Cognition Arousal/Alertness: Awake/alert Behavior During Therapy: WFL for tasks assessed/performed Overall Cognitive Status: Within Functional Limits for tasks assessed                                        General Comments      Exercises     Assessment/Plan    PT Assessment Patient needs continued PT services  PT Problem List Decreased strength;Decreased activity tolerance;Decreased mobility;Decreased balance       PT Treatment Interventions DME instruction;Gait training;Stair training;Functional mobility training;Therapeutic activities;Therapeutic exercise;Patient/family education;Balance training    PT Goals (Current goals can be found in the Care Plan section)  Acute Rehab PT Goals Patient Stated Goal: return home after rehab PT Goal Formulation: With patient Time For Goal Achievement: 01/15/21 Potential to Achieve Goals: Good    Frequency Min 3X/week   Barriers to discharge        Co-evaluation               AM-PAC PT "6 Clicks" Mobility  Outcome Measure Help needed turning from your back to your side while in a flat bed without using  bedrails?: None Help needed moving from lying on your back to sitting on the side of a flat bed without using bedrails?: None Help needed moving to and from a bed to a chair (including a wheelchair)?: A Little Help needed standing up from a chair using your arms (e.g., wheelchair or bedside chair)?: A Little Help needed to walk in hospital room?: A Little Help needed climbing 3-5 steps with a railing? : A Lot 6 Click Score: 19    End of Session Equipment Utilized During Treatment: Oxygen Activity Tolerance: Patient tolerated treatment well;Patient limited by fatigue Patient left: in chair;with call bell/phone within reach Nurse Communication: Mobility status PT Visit Diagnosis: Unsteadiness on feet (R26.81);Other abnormalities of gait and mobility (R26.89);Muscle weakness (generalized) (M62.81)    Time: 7989-2119 PT Time Calculation (min) (ACUTE ONLY): 20 min   Charges:   PT Evaluation $PT Eval Moderate Complexity: 1 Mod PT Treatments $Therapeutic Activity: 8-22 mins        2:15 PM, 01/01/21 [name], MPT Physical Therapist with [facility] [phone] office [phone] mobile phone

## 2021-01-02 DIAGNOSIS — I10 Essential (primary) hypertension: Secondary | ICD-10-CM | POA: Diagnosis not present

## 2021-01-02 DIAGNOSIS — N179 Acute kidney failure, unspecified: Secondary | ICD-10-CM | POA: Diagnosis not present

## 2021-01-02 DIAGNOSIS — I509 Heart failure, unspecified: Secondary | ICD-10-CM | POA: Diagnosis not present

## 2021-01-02 DIAGNOSIS — I5033 Acute on chronic diastolic (congestive) heart failure: Secondary | ICD-10-CM | POA: Diagnosis not present

## 2021-01-02 LAB — CBC
HCT: 49.5 % (ref 39.0–52.0)
Hemoglobin: 15.4 g/dL (ref 13.0–17.0)
MCH: 28.1 pg (ref 26.0–34.0)
MCHC: 31.1 g/dL (ref 30.0–36.0)
MCV: 90.2 fL (ref 80.0–100.0)
Platelets: 107 10*3/uL — ABNORMAL LOW (ref 150–400)
RBC: 5.49 MIL/uL (ref 4.22–5.81)
RDW: 16.1 % — ABNORMAL HIGH (ref 11.5–15.5)
WBC: 5.3 10*3/uL (ref 4.0–10.5)
nRBC: 0 % (ref 0.0–0.2)

## 2021-01-02 LAB — BASIC METABOLIC PANEL
Anion gap: 12 (ref 5–15)
BUN: 86 mg/dL — ABNORMAL HIGH (ref 8–23)
CO2: 24 mmol/L (ref 22–32)
Calcium: 8.6 mg/dL — ABNORMAL LOW (ref 8.9–10.3)
Chloride: 102 mmol/L (ref 98–111)
Creatinine, Ser: 2.97 mg/dL — ABNORMAL HIGH (ref 0.61–1.24)
GFR, Estimated: 22 mL/min — ABNORMAL LOW (ref >60)
Glucose, Bld: 188 mg/dL — ABNORMAL HIGH (ref 70–99)
Potassium: 4.1 mmol/L (ref 3.5–5.1)
Sodium: 138 mmol/L (ref 135–145)

## 2021-01-02 LAB — BRAIN NATRIURETIC PEPTIDE: B Natriuretic Peptide: 171 pg/mL — ABNORMAL HIGH (ref 0.0–100.0)

## 2021-01-02 LAB — GLUCOSE, CAPILLARY: Glucose-Capillary: 176 mg/dL — ABNORMAL HIGH (ref 70–99)

## 2021-01-02 MED ORDER — IPRATROPIUM-ALBUTEROL 0.5-2.5 (3) MG/3ML IN SOLN
3.0000 mL | Freq: Two times a day (BID) | RESPIRATORY_TRACT | Status: DC
  Administered 2021-01-02: 3 mL via RESPIRATORY_TRACT
  Filled 2021-01-02: qty 3

## 2021-01-02 NOTE — Progress Notes (Addendum)
EMS to floor to pick up patient.  Vitals stable, patient alert and oriented with no complaints.  Patient to leave floor in stable condition. Patient has two belonging bags with clothing and personal items, including clothing and shoes.

## 2021-01-02 NOTE — Care Management Important Message (Signed)
Important Message  Patient Details  Name: [name] [name] MRN: [id number] Date of Birth: [birth year]   Medicare Important Message Given:  Yes     Tommy Medal 01/02/2021, 10:39 AM

## 2021-01-02 NOTE — Progress Notes (Signed)
[facility] ROUNDING NOTE   Subjective:   Brief history: This 24 gentleman hypertension hyperlipidemia diabetes obesity non-Hodgkin's lymphoma status postradiation chemotherapy.  History of peripheral neuropathy CKD followed in [facility].  He has presumed diabetic nephropathy.  He presented with shortness of breath and fevers probably combination of congestive heart failure and COPD.  Baseline creatinine appears to be about 3 mg/dL per patient.  Patient transition to oral diuretics  Blood pressure 157/86 pulse 83 temperature 98 O2 sats 98% 4 L nasal cannula  Labs 01/01/2021 sodium 138 potassium 4 chloride 101 CO2 25 BUN 83 creatinine 2.97 glucose 149 calcium 8.6  Amlodipine 10 mg daily, Eliquis 5 mg daily aspirin 81 mg daily budesonide twice daily, insulin Levemir 45 units, DuoNeb every 4 hours, isosorbide 30 mg daily, levothyroxine 300 mcg daily, Solu-Medrol 60 mg every 12, simvastatin 5 mg daily.  Urine output 550 cc 01/01/2021  2D echo 55% ejection fraction with mild aortic sclerosis and mildly enlarged right ventricle  Outpatient medications included 80 mg daily as needed Lasix for lower extremity edema      Objective:  Vital signs in last 24 hours:  Temp:  [97.6 F (36.4 C)-98 F (36.7 C)] 98 F (36.7 C) (06/02 0551) Pulse Rate:  [83-93] 83 (06/02 0551) Resp:  [19] 19 (06/02 0551) BP: (124-157)/(81-95) 157/86 (06/02 0551) SpO2:  [92 %-99 %] 98 % (06/02 0551) Weight:  [146.6 kg] 146.6 kg (06/02 0551)  Weight change: -0.6 kg Filed Weights   12/30/20 0410 01/01/21 0615 01/02/21 0551  Weight: (!) 145.5 kg (!) 147.2 kg (!) 146.6 kg    Intake/Output: I/O last 3 completed shifts: In: 2487 [P.O.:2487] Out: 4540 [Urine:1275]   Intake/Output this shift:  No intake/output data recorded.  General:NAD, comfortable Heart:RRR, s1s2 nl, no rubs Lungs:clear b/l, no crackle Abdomen:soft, Non-tender, non-distended Extremities: Chronic stasis changes, no pitting  edema Neurology: Alert, awake and following commands.  No asterixis  Basic Metabolic Panel: Recent Labs  Lab 12/28/20 0550 12/29/20 0519 12/30/20 0529 12/31/20 0421 01/01/21 0547  NA 139 137 138 137 138  K 3.7 4.5 4.3 4.2 4.0  CL 101 101 102 101 101  CO2 29 26 25 23 25   GLUCOSE 128* 182* 146* 163* 149*  BUN 40* 51* 65* 80* 83*  CREATININE 2.82* 3.10* 3.27* 3.21* 2.97*  CALCIUM 9.0 8.4* 8.6* 8.6* 8.6*  MG 2.3  --   --   --   --     Liver Function Tests: Recent Labs  Lab 12/28/20 0236 12/28/20 0550  AST 13* 13*  ALT 8 8  ALKPHOS 91 98  BILITOT 1.0 1.0  PROT 7.5 8.0  ALBUMIN 4.0 4.3   No results for input(s): LIPASE, AMYLASE in the last 168 hours. No results for input(s): AMMONIA in the last 168 hours.  CBC: Recent Labs  Lab 12/28/20 0236 12/28/20 0550 12/29/20 0519 12/30/20 0529 12/31/20 0421 01/01/21 0547  WBC 5.9 6.0 5.6 6.1 7.2 4.9  NEUTROABS 4.3  --   --   --   --   --   HGB 14.3 15.7 13.6 14.2 14.5 14.2  HCT 46.4 51.9 44.9 45.1 46.4 45.8  MCV 92.2 92.2 92.8 90.7 91.0 89.6  PLT 127* 126* 127* 134* 123* 117*    Cardiac Enzymes: No results for input(s): CKTOTAL, CKMB, CKMBINDEX, TROPONINI in the last 168 hours.  BNP: Invalid input(s): POCBNP  CBG: Recent Labs  Lab 12/31/20 2142 01/01/21 0736 01/01/21 1130 01/01/21 1707 01/01/21 2015  GLUCAP 259* 128*  181* 132* 140*    Microbiology: Results for orders placed or performed during the hospital encounter of 12/28/20  Resp Panel by RT-PCR (Flu A&B, Covid) Nasopharyngeal Swab     Status: None   Collection Time: 12/28/20  2:49 AM   Specimen: Nasopharyngeal Swab; Nasopharyngeal(NP) swabs in vial transport medium  Result Value Ref Range Status   SARS Coronavirus 2 by RT PCR NEGATIVE NEGATIVE Final    Comment: (NOTE) SARS-CoV-2 target nucleic acids are NOT DETECTED.  The SARS-CoV-2 RNA is generally detectable in upper respiratory specimens during the acute phase of infection. The  lowest concentration of SARS-CoV-2 viral copies this assay can detect is 138 copies/mL. A negative result does not preclude SARS-Cov-2 infection and should not be used as the sole basis for treatment or other patient management decisions. A negative result may occur with  improper specimen collection/handling, submission of specimen other than nasopharyngeal swab, presence of viral mutation(s) within the areas targeted by this assay, and inadequate number of viral copies(<138 copies/mL). A negative result must be combined with clinical observations, patient history, and epidemiological information. The expected result is Negative.  Fact Sheet for Patients:  EntrepreneurPulse.com.au  Fact Sheet for Healthcare Providers:  IncredibleEmployment.be  This test is no t yet approved or cleared by the [place] FDA and  has been authorized for detection and/or diagnosis of SARS-CoV-2 by FDA under an Emergency Use Authorization (EUA). This EUA will remain  in effect (meaning this test can be used) for the duration of the COVID-19 declaration under Section 564(b)(1) of the Act, 21 U.S.C.section 360bbb-3(b)(1), unless the authorization is terminated  or revoked sooner.       Influenza A by PCR NEGATIVE NEGATIVE Final   Influenza B by PCR NEGATIVE NEGATIVE Final    Comment: (NOTE) The Xpert Xpress SARS-CoV-2/FLU/RSV plus assay is intended as an aid in the diagnosis of influenza from Nasopharyngeal swab specimens and should not be used as a sole basis for treatment. Nasal washings and aspirates are unacceptable for Xpert Xpress SARS-CoV-2/FLU/RSV testing.  Fact Sheet for Patients: EntrepreneurPulse.com.au  Fact Sheet for Healthcare Providers: IncredibleEmployment.be  This test is not yet approved or cleared by the [place] FDA and has been authorized for detection and/or diagnosis of SARS-CoV-2 by FDA under  an Emergency Use Authorization (EUA). This EUA will remain in effect (meaning this test can be used) for the duration of the COVID-19 declaration under Section 564(b)(1) of the Act, 21 U.S.C. section 360bbb-3(b)(1), unless the authorization is terminated or revoked.  Performed at [facility], [address], [place], [place] [address]   MRSA PCR Screening     Status: None   Collection Time: 12/28/20 10:00 PM   Specimen: Nasal Mucosa; Nasopharyngeal  Result Value Ref Range Status   MRSA by PCR NEGATIVE NEGATIVE Final    Comment:        The GeneXpert MRSA Assay (FDA approved for NASAL specimens only), is one component of a comprehensive MRSA colonization surveillance program. It is not intended to diagnose MRSA infection nor to guide or monitor treatment for MRSA infections. Performed at [facility], [address] [place],  [address]     Coagulation Studies: No results for input(s): LABPROT, INR in the last 72 hours.  Urinalysis: No results for input(s): COLORURINE, LABSPEC, PHURINE, GLUCOSEU, HGBUR, BILIRUBINUR, KETONESUR, PROTEINUR, UROBILINOGEN, NITRITE, LEUKOCYTESUR in the last 72 hours.  Invalid input(s): APPERANCEUR    Imaging: No results found.   Medications:    . amLODipine  10  mg Oral Daily  . apixaban  5 mg Oral BID  . aspirin EC  81 mg Oral Daily  . budesonide (PULMICORT) nebulizer solution  0.25 mg Nebulization BID  . chlorhexidine  15 mL Mouth Rinse BID  . Chlorhexidine Gluconate Cloth  6 each Topical Daily  . furosemide  80 mg Oral Daily  . insulin aspart  0-15 Units Subcutaneous TID WC  . insulin aspart  0-5 Units Subcutaneous QHS  . insulin detemir  45 Units Subcutaneous QHS  . ipratropium-albuterol  3 mL Inhalation QID  . isosorbide mononitrate  30 mg Oral Daily  . levothyroxine  300 mcg Oral Q0600  . mouth rinse  15 mL Mouth Rinse q12n4p  . methylPREDNISolone (SOLU-MEDROL) injection  60 mg Intravenous Q12H  . 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 He presented with shortness of breath which was probably combination of mild CHF and COPD. He received multiple doses of IV Lasix with uptrending BUN and creatinine level today. He does not look overtly volume up on exam.  The Lasix was held yesterday and he had a urine output of around 2 L.  Creatinine level stable today.   Would continue oral Lasix as outpatient.  #Shortness of breath/chronic hypoxicrespiratory failure/COPD: He is currently using 3L of oxygen which is his home dose. Received multiple doses of IV diuretics for CHF as discussed above. Continue bronchodilators, steroid.  #Heart failure with preserved EF: Complicated by CKD. Only minimal elevation of BNP. Diuretics as discussed above.  #Hypertension: Resume home antihypertensive medication including amlodipine, isosorbide. Monitor BP. Daily evaluation for diuretics need.  #Atrial fibrillation: On Eliquis. Monitor heart rate.    LOS: [place] @TODAY @6 :48 AM

## 2021-01-02 NOTE — Discharge Summary (Signed)
Physician Discharge Summary [facility]    Patient: [name] [name]                   Admit date: 12/28/2020   DOB: [birth year]             Discharge date:01/02/2021/8:53 AM QQV:[id number]                          PCP: Nickola Major, MD  Disposition:  SNF  Recommendations for Outpatient Follow-up:     Follow up: PCP in 1 week  Follow-up with nephrologist in 1-2 weeks  Recheck labs including CBC, kidney function in 1 week  Follow-up with cardiologist in 1 to 2 weeks  Will need referral to pulmonologist  Needs to be on restricted free fluid intake 1200-1500/day  Daily weight ... Any increase in weight in 24 - 48 hours 3-to 5 pounds, shortness of breath, extremity edema extra dose of Lasix is warranted with consultation of, heart failure team or PCP  Baseline O2 demand 3 L may increase with exertion     Discharge Condition: Stable   Code Status:   Code Status: Full Code  Diet recommendation: Cardiac diet   Discharge Diagnoses:    Principal Problem:   CHF exacerbation ([place]) Active Problems:   Acute on chronic renal failure (HCC)   Type 2 diabetes mellitus with stage 4 chronic kidney disease ([place])   Hypertension   Hypothyroidism   Acute respiratory failure with hypoxia (HCC)   CHF (congestive heart failure) ([place])   History of Present Illness/ Hospital Course [name] [name] Summary:   [name] [name] a75 y.o.male,with history of type 2 diabetes mellitus, morbid obesity, coronary artery disease, non-Hodgkin's lymphoma, hypothyroidism, hyperlipidemia, essential hypertension, CKD stage III, atrial fibrillation,and more presents the ED with chief complaint of dyspnea.  O2 dependent 3 L at baseline.. Progressively worsening shortness of breath over 2 days  He admits to a cough that is productive of yellow sputum occasionally, noted edema in his legs, missed some home medications  He is vaccinated for COVID. He is full code but does not want long-term  life support.  In the ED  patient was afebrile, heart rate 67-84, respiratory rate 18-22, blood pressure 150/94, satting at 99% on 6 L No leukocytosis, chemistry reveals a creatinine that is at patient's baseline around 2.89 BNP is elevated at 266 Trope was minimally elevated at 28, repeat pending Respiratory panel is negative Chest x-ray shows cardiomegaly with vascular congestion and pulmonary edema with pleural effusions greater on the left than the right and left basilar atelectasis -80 mg of Lasix given in the ED -Solu-Medrol 80 mg given in the ED EKG shows A. fib heart rate 77, QTc 483 Last echo was in December 2021 showed ejection fraction of 50 to 55% with grade 2 diastolic dysfunction     1. Acute respiratory failure with hypoxia 1. Stable back to baseline 3 L of oxygen satting  > 92 % 2. Based on presentation likely CHF exacerbation, with underlying COPD 3. With successfully weaned off BiPAP, to 6 L oxygen this morning 3 L of oxygen, satting 95% 4. On admission was satting 83% on 3 L 5. On admission: wason BiPAP with O2 rate of 12, FiO2 of 60%, ABG 7.24/PCO2 65.3/PO2 of 74.3 6. Res panel Negative 7. CXR indicative of CHF 8. Continue Solu-Medrol, DuoNeb bronchodilatorbreathing treatments scheduled 9. Tapering down Solu-Medrol>>> switch to p.o. prednisone with taper 10.  D/Ced IV Lasix  due to elevated BUN/creatinine, per nephrologist recommendation resumed Lasix at lower dose p.o.  2. CHF exacerbation 1. Monitoring daily weight, labs proBNP (241, 180, 199,) 2. CXR with cardiomegaly and vascular congestion 3. Mildly elevated troponin, consistent with ischemic demand recycling troponins 28, 31 4. We will continue to hold Lasix--as worsening BUN/creatinine 65/3.27 respectively 5. We will monitor daily weight, I's and O's 6. Fluid restriction 7. We will continue home medications of asa, imdur, and statin 8. Last weight 147.2 kg (320 lb 12.3  oz 3. Hypokalemia 1. Potassium 3.4, 3.7, 4.5,4.3, 4.1 2. K+ replace and recheck 3. Monitoring 4. HTN 1. Continue amlodipine 2. Mildly elevated,stable 5. DMII 1. Checking CBG QA CHS, SSI coverage 2. Continue long-acting insulin Tresiba 3. Anticipating hyperglycemia due to steroids 6. Hypothyroidism 1. Continue synthroid 2. Stable 7. Afib 1. Continue Eliquis 2. Remained stable 8. Toe injury 1. X-ray right foot-reviewed negative for any abnormality or fracture 2. As needed analgesics  9. Acute on chronic kidney disease stage IIIa 1. Possible cardiorenal syndrome BUN 39, 40, creatinine 2.89, 2.82 >>>3.10>> ,3.27. 2.97 2. We will holding high intensity Lasix, will initiate tomorrow with low-dose 3. Consulting nephrology for further evaluation recommendation possible developing cardiorenal Syndrome >>>> patient has diuresed well over 2 L on this admission, recommend would hold Lasix due to elevated BUN/creatinine   ------------------------------------------------------------------------------------------------------------------------------------- Nutritional status:  The patient's BMI is:Body mass index is 42.32 kg/m. I agree with the assessment and plan as outlined  Consultants: card/nephrology   -------------------------------------------------------------------------------------------------------------------------------------------- Code Status:Code Status: Full Code  Family Communication:Patient is alert and oriented x4, discussed with patient in detail The above findings and plan of care has been discussed with patient (and family) in detail,  they expressed understanding and agreement of above. -Advance care planning has been discussed.   Dispo: The patient is from: Home Anticipated d/c is to:SNF       Discharge Instructions:   Discharge Instructions    (HEART FAILURE PATIENTS)  Call MD:  Anytime you have any of the following symptoms: 1) 3 pound weight gain in 24 hours or 5 pounds in 1 week 2) shortness of breath, with or without a dry hacking cough 3) swelling in the hands, feet or stomach 4) if you have to sleep on extra pillows at night in order to breathe.   Complete by: As directed    (HEART FAILURE PATIENTS) Call MD:  Anytime you have any of the following symptoms: 1) 3 pound weight gain in 24 hours or 5 pounds in 1 week 2) shortness of breath, with or without a dry hacking cough 3) swelling in the hands, feet or stomach 4) if you have to sleep on extra pillows at night in order to breathe.   Complete by: As directed    (HEART FAILURE PATIENTS) Call MD:  Anytime you have any of the following symptoms: 1) 3 pound weight gain in 24 hours or 5 pounds in 1 week 2) shortness of breath, with or without a dry hacking cough 3) swelling in the hands, feet or stomach 4) if you have to sleep on extra pillows at night in order to breathe.   Complete by: As directed    AMB referral to CHF clinic   Complete by: As directed    Activity as tolerated - No restrictions   Complete by: As directed    Activity as tolerated - No restrictions   Complete by: As directed    Activity as tolerated - No restrictions   Complete  by: As directed    Call MD for:  difficulty breathing, headache or visual disturbances   Complete by: As directed    Call MD for:  difficulty breathing, headache or visual disturbances   Complete by: As directed    Call MD for:  persistant dizziness or light-headedness   Complete by: As directed    Call MD for:  persistant nausea and vomiting   Complete by: As directed    Call MD for:  temperature >100.4   Complete by: As directed    Call MD for:  temperature >100.4   Complete by: As directed    Diet - low sodium heart healthy   Complete by: As directed    Discharge instructions   Complete by: As directed    F/up with neurologist nephrologist in 1  week. Kidney function to be evaluated, check with labs in 1 week   Discharge instructions   Complete by: As directed    Please continue with monitoring of daily weight, any increased in your weight in 24 hours 3 to 5 pounds may take an extra dose of Lasix. Please follow-up with your cardiologist, PCP, nephrologist within 2-3 weeks Monitor your fluid intake   Increase activity slowly   Complete by: As directed    Increase activity slowly   Complete by: As directed    Increase activity slowly   Complete by: As directed        Medication List    TAKE these medications   acetaminophen 325 MG tablet Commonly known as: TYLENOL Take 2 tablets (650 mg total) by mouth every 6 (six) hours as needed for mild pain (or Fever >/= 101).   albuterol 108 (90 Base) MCG/ACT inhaler Commonly known as: VENTOLIN HFA Inhale 1-2 puffs into the lungs every 6 (six) hours as needed for wheezing or shortness of breath.   amLODipine 10 MG tablet Commonly known as: NORVASC Take 10 mg by mouth daily.   aspirin EC 81 MG tablet Take 81 mg by mouth daily. Swallow whole.   bisoprolol 5 MG tablet Commonly known as: ZEBETA Take 5 mg by mouth daily.   chlorhexidine 0.12 % solution Commonly known as: PERIDEX 15 mLs by Mouth Rinse route 2 (two) times daily.   Eliquis 5 MG Tabs tablet Generic drug: apixaban Take 1 tablet (5 mg total) by mouth 2 (two) times daily. This is a dose change   fluticasone 50 MCG/ACT nasal spray Commonly known as: FLONASE Place 2 sprays into both nostrils as needed for up to 14 days.   furosemide 80 MG tablet Commonly known as: LASIX Take 0.5 tablets (40 mg total) by mouth daily. What changed:   how much to take  when to take this  reasons to take this   insulin aspart 100 UNIT/ML injection Commonly known as: novoLOG Inject 0-15 Units into the skin 3 (three) times daily with meals.   insulin degludec 100 UNIT/ML FlexTouch Pen Commonly known as: TRESIBA Inject 59  Units into the skin daily at 10 pm.   ipratropium-albuterol 0.5-2.5 (3) MG/3ML Soln Commonly known as: DUONEB Inhale 3 mLs into the lungs every 4 (four) hours as needed (shortness of breath).   isosorbide mononitrate 30 MG 24 hr tablet Commonly known as: IMDUR Take 1 tablet (30 mg total) by mouth daily.   levocetirizine 5 MG tablet Commonly known as: XYZAL Take 5 mg by mouth every evening.   levothyroxine 200 MCG tablet Commonly known as: SYNTHROID Take 300 mcg by mouth daily  before breakfast. (takes with 149mcg for a total of 344mcg) What changed: Another medication with the same name was removed. Continue taking this medication, and follow the directions you see here.   methylPREDNISolone 4 MG Tbpk tablet Commonly known as: MEDROL DOSEPAK Medrol Dosepak take as instructed   mouth rinse Liqd solution 15 mLs by Mouth Rinse route 2 times daily at 12 noon and 4 pm.   nitroGLYCERIN 0.4 MG SL tablet Commonly known as: NITROSTAT Place 0.4 mg under the tongue every 5 (five) minutes as needed for chest pain.   nystatin powder Commonly known as: MYCOSTATIN/NYSTOP Apply topically 2 (two) times daily.   OXYGEN Inhale 3 L into the lungs daily as needed.   potassium chloride SA 20 MEQ tablet Commonly known as: KLOR-CON Take 1 tablet (20 mEq total) by mouth 2 (two) times daily.   senna-docusate 8.6-50 MG tablet Commonly known as: Senokot-S Take 1 tablet by mouth daily as needed for mild constipation.   simvastatin 5 MG tablet Commonly known as: ZOCOR Take 5 mg by mouth daily.   Symbicort 160-4.5 MCG/ACT inhaler Generic drug: budesonide-formoterol Inhale 2 puffs into the lungs daily as needed (shortness of breath).       Contact information for after-discharge care    [facility] .   Service: Skilled Nursing Contact information: [address] [place] [phone]                 No  Known Allergies   Procedures /Studies:   DG Chest Portable 1 View  Result Date: 12/28/2020 CLINICAL DATA:  Shortness of breath EXAM: PORTABLE CHEST 1 VIEW COMPARISON:  11/19/2020 FINDINGS: Cardiomegaly with vascular congestion and diffuse interstitial opacity consistent with pulmonary edema. Small left greater than right pleural effusion. Basilar airspace disease on the left. Aortic atherosclerosis. No pneumothorax IMPRESSION: 1. Cardiomegaly with vascular congestion and pulmonary edema. 2. Small left greater than right pleural effusions with left basilar atelectasis or pneumonia. Similar findings on 11/19/2020 comparison Electronically Signed   By: Donavan Foil M.D.   On: 12/28/2020 03:20   DG Toe 3rd Right  Result Date: 12/28/2020 CLINICAL DATA:  Right third toe injury EXAM: RIGHT THIRD TOE COMPARISON:  None. FINDINGS: There is no evidence of fracture or dislocation. There is no evidence of arthropathy or other focal bone abnormality. Soft tissues are unremarkable. IMPRESSION: Negative. Electronically Signed   By: Monte Fantasia M.D.   On: 12/28/2020 05:39   ECHOCARDIOGRAM COMPLETE  Result Date: 12/28/2020    ECHOCARDIOGRAM REPORT   Patient Name:   LENZIE SANDLER Date of Exam: 12/28/2020 Medical Rec #:  [id number]     Height:       73.0 in Accession #:    [id number]    Weight:       308.6 lb Date of Birth:  Sep 10, 1945     BSA:          2.587 m Patient Age:    22 years      BP:           146/83 mmHg Patient Gender: M             HR:           75 bpm. Exam Location:  [facility] Procedure: 2D Echo, Cardiac Doppler and Color Doppler Indications:    CHF  History:        Patient has prior history of Echocardiogram examinations, most  recent 07/20/2020. CAD, Arrythmias:LBBB and Atrial Fibrillation;                 Risk Factors:Diabetes, Dyslipidemia, Hypertension and Current                 Smoker.  Sonographer:    Maudry Mayhew MHA, RDMS, RVT, RDCS Referring Phys: [id number] ASIA B  Wright  Sonographer Comments: Suboptimal subcostal window. Image acquisition challenging due to patient body habitus, Image acquisition challenging due to respiratory motion and on CPAP. IMPRESSIONS  1. Left ventricular ejection fraction, by estimation, is 55 to 60%. The left ventricle has normal function. The left ventricle has no regional wall motion abnormalities. The left ventricular internal cavity size was moderately dilated. Left ventricular diastolic function could not be evaluated.  2. Right ventricular systolic function is normal. The right ventricular size is mildly enlarged. There is moderately elevated pulmonary artery systolic pressure.  3. Left atrial size was mildly dilated.  4. Right atrial size was moderately dilated.  5. The mitral valve is normal in structure. Trivial mitral valve regurgitation. No evidence of mitral stenosis.  6. The aortic valve is tricuspid. Aortic valve regurgitation is not visualized. Mild aortic valve sclerosis is present, with no evidence of aortic valve stenosis.  7. The inferior vena cava is dilated in size with >50% respiratory variability, suggesting right atrial pressure of 8 mmHg. FINDINGS  Left Ventricle: Left ventricular ejection fraction, by estimation, is 55 to 60%. The left ventricle has normal function. The left ventricle has no regional wall motion abnormalities. Definity contrast agent was given IV to delineate the left ventricular  endocardial borders. The left ventricular internal cavity size was moderately dilated. There is no left ventricular hypertrophy. Left ventricular diastolic function could not be evaluated due to atrial fibrillation. Left ventricular diastolic function could not be evaluated. Right Ventricle: The right ventricular size is mildly enlarged.Right ventricular systolic function is normal. There is moderately elevated pulmonary artery systolic pressure. The tricuspid regurgitant velocity is 3.54 m/s, and with an assumed right  atrial pressure of 8 mmHg, the estimated right ventricular systolic pressure is 75.1 mmHg. Left Atrium: Left atrial size was mildly dilated. Right Atrium: Right atrial size was moderately dilated. Pericardium: There is no evidence of pericardial effusion. Mitral Valve: The mitral valve is normal in structure. Trivial mitral valve regurgitation. No evidence of mitral valve stenosis. Tricuspid Valve: The tricuspid valve is normal in structure. Tricuspid valve regurgitation is trivial. No evidence of tricuspid stenosis. Aortic Valve: The aortic valve is tricuspid. Aortic valve regurgitation is not visualized. Mild aortic valve sclerosis is present, with no evidence of aortic valve stenosis. Aortic valve mean gradient measures 5.0 mmHg. Aortic valve peak gradient measures 10.2 mmHg. Aortic valve area, by VTI measures 1.90 cm. Pulmonic Valve: The pulmonic valve was not well visualized. Pulmonic valve regurgitation is not visualized. No evidence of pulmonic stenosis. Aorta: The aortic root is normal in size and structure. Venous: The inferior vena cava is dilated in size with greater than 50% respiratory variability, suggesting right atrial pressure of 8 mmHg.   LEFT VENTRICLE PLAX 2D LVIDd:         6.30 cm LVIDs:         4.80 cm LV PW:         0.70 cm LV IVS:        0.90 cm LVOT diam:     2.00 cm LV SV:         60 LV SV Index:  23 LVOT Area:     3.14 cm  LV Volumes (MOD) LV vol d, MOD A2C: 186.0 ml LV vol d, MOD A4C: 153.0 ml LV vol s, MOD A2C: 71.4 ml LV vol s, MOD A4C: 104.0 ml LV SV MOD A2C:     114.6 ml LV SV MOD A4C:     153.0 ml LV SV MOD BP:      88.7 ml RIGHT VENTRICLE TAPSE (M-mode): 1.7 cm LEFT ATRIUM              Index       RIGHT ATRIUM           Index LA diam:        4.10 cm  1.58 cm/m  RA Area:     32.20 cm LA Vol (A2C):   134.0 ml 51.79 ml/m RA Volume:   137.00 ml 52.95 ml/m LA Vol (A4C):   58.3 ml  22.53 ml/m LA Biplane Vol: 90.1 ml  34.82 ml/m  AORTIC VALVE AV Area (Vmax):    1.84 cm AV Area  (Vmean):   1.73 cm AV Area (VTI):     1.90 cm AV Vmax:           160.00 cm/s AV Vmean:          107.000 cm/s AV VTI:            0.315 m AV Peak Grad:      10.2 mmHg AV Mean Grad:      5.0 mmHg LVOT Vmax:         93.60 cm/s LVOT Vmean:        58.800 cm/s LVOT VTI:          0.191 m LVOT/AV VTI ratio: 0.61  AORTA Ao Root diam: 3.40 cm TRICUSPID VALVE TR Peak grad:   50.1 mmHg TR Vmax:        354.00 cm/s  SHUNTS Systemic VTI:  0.19 m Systemic Diam: 2.00 cm Kirk Ruths MD Electronically signed by Kirk Ruths MD Signature Date/Time: 12/28/2020/12:18:15 PM    Final     Subjective:   Patient was seen and examined 01/02/2021, 8:53 AM Patient stable today. No acute distress.  No issues overnight Stable for discharge.  Discharge Exam:    Vitals:   01/01/21 2018 01/02/21 0431 01/02/21 0551 01/02/21 0728  BP: (!) 136/95  (!) 157/86   Pulse: 86  83   Resp: 19  19   Temp: 97.6 F (36.4 C)  98 F (36.7 C)   TempSrc: Oral  Oral   SpO2: 93% 95% 98% 99%  Weight:   (!) 146.6 kg   Height:           Physical Exam:   General:  Alert, oriented, cooperative, no distress;   HEENT:  Normocephalic, PERRL, otherwise with in Normal limits   Neuro:  CNII-XII intact. , normal motor and sensation, reflexes intact   Lungs:   Clear to auscultation BL, Respirations unlabored, no wheezes / crackles  Cardio:    S1/S2, RRR, No murmure, No Rubs or Gallops   Abdomen:   Soft, non-tender, bowel sounds active all four quadrants,  no guarding or peritoneal signs.  Muscular skeletal:  Limited exam - in bed, able to move all 4 extremities, Normal strength,  2+ pulses,  symmetric, +2 pitting edema  Skin:  Dry, warm to touch, negative for any Rashes,  Wounds: Please see nursing documentation           The  results of significant diagnostics from this hospitalization (including imaging, microbiology, ancillary and laboratory) are listed below for reference.      Microbiology:   Recent Results (from the past  240 hour(s))  Resp Panel by RT-PCR (Flu A&B, Covid) Nasopharyngeal Swab     Status: None   Collection Time: 12/28/20  2:49 AM   Specimen: Nasopharyngeal Swab; Nasopharyngeal(NP) swabs in vial transport medium  Result Value Ref Range Status   SARS Coronavirus 2 by RT PCR NEGATIVE NEGATIVE Final    Comment: (NOTE) SARS-CoV-2 target nucleic acids are NOT DETECTED.  The SARS-CoV-2 RNA is generally detectable in upper respiratory specimens during the acute phase of infection. The lowest concentration of SARS-CoV-2 viral copies this assay can detect is 138 copies/mL. A negative result does not preclude SARS-Cov-2 infection and should not be used as the sole basis for treatment or other patient management decisions. A negative result may occur with  improper specimen collection/handling, submission of specimen other than nasopharyngeal swab, presence of viral mutation(s) within the areas targeted by this assay, and inadequate number of viral copies(<138 copies/mL). A negative result must be combined with clinical observations, patient history, and epidemiological information. The expected result is Negative.  Fact Sheet for Patients:  EntrepreneurPulse.com.au  Fact Sheet for Healthcare Providers:  IncredibleEmployment.be  This test is no t yet approved or cleared by the [place] FDA and  has been authorized for detection and/or diagnosis of SARS-CoV-2 by FDA under an Emergency Use Authorization (EUA). This EUA will remain  in effect (meaning this test can be used) for the duration of the COVID-19 declaration under Section 564(b)(1) of the Act, 21 U.S.C.section 360bbb-3(b)(1), unless the authorization is terminated  or revoked sooner.       Influenza A by PCR NEGATIVE NEGATIVE Final   Influenza B by PCR NEGATIVE NEGATIVE Final    Comment: (NOTE) The Xpert Xpress SARS-CoV-2/FLU/RSV plus assay is intended as an aid in the diagnosis of influenza  from Nasopharyngeal swab specimens and should not be used as a sole basis for treatment. Nasal washings and aspirates are unacceptable for Xpert Xpress SARS-CoV-2/FLU/RSV testing.  Fact Sheet for Patients: EntrepreneurPulse.com.au  Fact Sheet for Healthcare Providers: IncredibleEmployment.be  This test is not yet approved or cleared by the [place] FDA and has been authorized for detection and/or diagnosis of SARS-CoV-2 by FDA under an Emergency Use Authorization (EUA). This EUA will remain in effect (meaning this test can be used) for the duration of the COVID-19 declaration under Section 564(b)(1) of the Act, 21 U.S.C. section 360bbb-3(b)(1), unless the authorization is terminated or revoked.  Performed at [facility], [address] [place], [place] [address]   MRSA PCR Screening     Status: None   Collection Time: 12/28/20 10:00 PM   Specimen: Nasal Mucosa; Nasopharyngeal  Result Value Ref Range Status   MRSA by PCR NEGATIVE NEGATIVE Final    Comment:        The GeneXpert MRSA Assay (FDA approved for NASAL specimens only), is one component of a comprehensive MRSA colonization surveillance program. It is not intended to diagnose MRSA infection nor to guide or monitor treatment for MRSA infections. Performed at [facility], [address] [place], [place] [address]      Labs:   CBC: Recent Labs  Lab 12/28/20 0236 12/28/20 0550 12/29/20 0519 12/30/20 0529 12/31/20 0421 01/01/21 0547 01/02/21 0556  WBC 5.9   < > 5.6 6.1 7.2 4.9 5.3  NEUTROABS 4.3  --   --   --   --   --   --  HGB 14.3   < > 13.6 14.2 14.5 14.2 15.4  HCT 46.4   < > 44.9 45.1 46.4 45.8 49.5  MCV 92.2   < > 92.8 90.7 91.0 89.6 90.2  PLT 127*   < > 127* 134* 123* 117* 107*   < > = values in this interval not displayed.   Basic Metabolic Panel: Recent Labs  Lab 12/28/20 0550 12/29/20 0519 12/30/20 0529 12/31/20 0421 01/01/21 0547 01/02/21 0556   NA 139 137 138 137 138 138  K 3.7 4.5 4.3 4.2 4.0 4.1  CL 101 101 102 101 101 102  CO2 29 26 25 23 25 24   GLUCOSE 128* 182* 146* 163* 149* 188*  BUN 40* 51* 65* 80* 83* 86*  CREATININE 2.82* 3.10* 3.27* 3.21* 2.97* 2.97*  CALCIUM 9.0 8.4* 8.6* 8.6* 8.6* 8.6*  MG 2.3  --   --   --   --   --    Liver Function Tests: Recent Labs  Lab 12/28/20 0236 12/28/20 0550  AST 13* 13*  ALT 8 8  ALKPHOS 91 98  BILITOT 1.0 1.0  PROT 7.5 8.0  ALBUMIN 4.0 4.3   BNP (last 3 results) Recent Labs    12/31/20 0421 01/01/21 0547 01/02/21 0556  BNP 199.0* 207.0* 171.0*   Cardiac Enzymes: No results for input(s): CKTOTAL, CKMB, CKMBINDEX, TROPONINI in the last 168 hours. CBG: Recent Labs  Lab 01/01/21 0736 01/01/21 1130 01/01/21 1707 01/01/21 2015 01/02/21 0725  GLUCAP 128* 181* 132* 140* 176*   Hgb A1c No results for input(s): HGBA1C in the last 72 hours. Lipid Profile No results for input(s): CHOL, HDL, LDLCALC, TRIG, CHOLHDL, LDLDIRECT in the last 72 hours. Thyroid function studies No results for input(s): TSH, T4TOTAL, T3FREE, THYROIDAB in the last 72 hours.  Invalid input(s): FREET3 Anemia work up No results for input(s): VITAMINB12, FOLATE, FERRITIN, TIBC, IRON, RETICCTPCT in the last 72 hours. Urinalysis    Component Value Date/Time   COLORURINE YELLOW 11/19/2020 1736   APPEARANCEUR CLEAR 11/19/2020 1736   LABSPEC 1.015 11/19/2020 1736   PHURINE 6.0 11/19/2020 1736   GLUCOSEU NEGATIVE 11/19/2020 1736   HGBUR NEGATIVE 11/19/2020 1736   BILIRUBINUR NEGATIVE 11/19/2020 1736   KETONESUR NEGATIVE 11/19/2020 1736   PROTEINUR >=300 (A) 11/19/2020 1736   UROBILINOGEN 4.0 (H) 07/23/2014 1356   NITRITE NEGATIVE 11/19/2020 1736   LEUKOCYTESUR NEGATIVE 11/19/2020 1736         Time coordinating discharge: Over 45 minutes  SIGNED: Deatra James, MD, FACP, FHM. [facility],  Please use [email or web address] to Page If 7PM-7AM, please contact night-coverage [email or web address] 01/02/2021, 8:53 AM

## 2021-01-03 LAB — GLUCOSE, CAPILLARY
Glucose-Capillary: 250 mg/dL — ABNORMAL HIGH (ref 70–99)
Glucose-Capillary: 130 mg/dL — ABNORMAL HIGH (ref 70–99)

## 2021-01-07 ENCOUNTER — Other Ambulatory Visit: Payer: Self-pay | Admitting: *Deleted

## 2021-01-07 MED ORDER — ELIQUIS 5 MG PO TABS: 5.0000 mg | ORAL_TABLET | Freq: Two times a day (BID) | ORAL | 6 refills | Status: AC

## 2021-01-13 ENCOUNTER — Telehealth: Payer: Self-pay | Admitting: *Deleted

## 2021-01-13 NOTE — Telephone Encounter (Signed)
Patient called stating that he is confused about his Eliquis.

## 2021-01-13 NOTE — Telephone Encounter (Signed)
Spoke with pt.  Questions answered.  Pt to continue Eliquis 5mg  twice daily.  States he just received 3 bottles of 2.5mg  tablets.  Told him he can take 2 of those twice daily to use those up.  Verbalized understanding.

## 2021-01-14 ENCOUNTER — Emergency Department (HOSPITAL_COMMUNITY): Payer: Medicare HMO

## 2021-01-14 ENCOUNTER — Other Ambulatory Visit: Payer: Self-pay

## 2021-01-14 ENCOUNTER — Emergency Department (HOSPITAL_COMMUNITY)
Admission: EM | Admit: 2021-01-14 | Discharge: 2021-01-14 | Disposition: A | Discharge status: Home / Self Care | Payer: Medicare HMO | Attending: Emergency Medicine | Admitting: Emergency Medicine

## 2021-01-14 ENCOUNTER — Encounter (HOSPITAL_COMMUNITY): Payer: Self-pay

## 2021-01-14 DIAGNOSIS — J441 Chronic obstructive pulmonary disease with (acute) exacerbation: Secondary | ICD-10-CM | POA: Diagnosis not present

## 2021-01-14 DIAGNOSIS — I130 Hypertensive heart and chronic kidney disease with heart failure and stage 1 through stage 4 chronic kidney disease, or unspecified chronic kidney disease: Secondary | ICD-10-CM | POA: Diagnosis not present

## 2021-01-14 DIAGNOSIS — Z96652 Presence of left artificial knee joint: Secondary | ICD-10-CM | POA: Diagnosis not present

## 2021-01-14 DIAGNOSIS — Z955 Presence of coronary angioplasty implant and graft: Secondary | ICD-10-CM | POA: Diagnosis not present

## 2021-01-14 DIAGNOSIS — E1169 Type 2 diabetes mellitus with other specified complication: Secondary | ICD-10-CM | POA: Insufficient documentation

## 2021-01-14 DIAGNOSIS — Z87891 Personal history of nicotine dependence: Secondary | ICD-10-CM | POA: Diagnosis not present

## 2021-01-14 DIAGNOSIS — Z8616 Personal history of COVID-19: Secondary | ICD-10-CM | POA: Insufficient documentation

## 2021-01-14 DIAGNOSIS — Z8572 Personal history of non-Hodgkin lymphomas: Secondary | ICD-10-CM | POA: Insufficient documentation

## 2021-01-14 DIAGNOSIS — Z7982 Long term (current) use of aspirin: Secondary | ICD-10-CM | POA: Insufficient documentation

## 2021-01-14 DIAGNOSIS — R0602 Shortness of breath: Secondary | ICD-10-CM | POA: Diagnosis present

## 2021-01-14 DIAGNOSIS — E039 Hypothyroidism, unspecified: Secondary | ICD-10-CM | POA: Diagnosis not present

## 2021-01-14 DIAGNOSIS — I2510 Atherosclerotic heart disease of native coronary artery without angina pectoris: Secondary | ICD-10-CM | POA: Insufficient documentation

## 2021-01-14 DIAGNOSIS — E785 Hyperlipidemia, unspecified: Secondary | ICD-10-CM | POA: Diagnosis not present

## 2021-01-14 DIAGNOSIS — Z7901 Long term (current) use of anticoagulants: Secondary | ICD-10-CM | POA: Diagnosis not present

## 2021-01-14 DIAGNOSIS — E1142 Type 2 diabetes mellitus with diabetic polyneuropathy: Secondary | ICD-10-CM | POA: Diagnosis not present

## 2021-01-14 DIAGNOSIS — I5023 Acute on chronic systolic (congestive) heart failure: Secondary | ICD-10-CM | POA: Diagnosis not present

## 2021-01-14 DIAGNOSIS — E1122 Type 2 diabetes mellitus with diabetic chronic kidney disease: Secondary | ICD-10-CM | POA: Insufficient documentation

## 2021-01-14 DIAGNOSIS — I509 Heart failure, unspecified: Secondary | ICD-10-CM

## 2021-01-14 DIAGNOSIS — N184 Chronic kidney disease, stage 4 (severe): Secondary | ICD-10-CM | POA: Insufficient documentation

## 2021-01-14 DIAGNOSIS — Z794 Long term (current) use of insulin: Secondary | ICD-10-CM | POA: Insufficient documentation

## 2021-01-14 DIAGNOSIS — Z79899 Other long term (current) drug therapy: Secondary | ICD-10-CM | POA: Diagnosis not present

## 2021-01-14 LAB — CBC WITH DIFFERENTIAL/PLATELET
Abs Immature Granulocytes: 0.02 10*3/uL (ref 0.00–0.07)
Basophils Absolute: 0.1 10*3/uL (ref 0.0–0.1)
Basophils Relative: 1 %
Eosinophils Absolute: 0.3 10*3/uL (ref 0.0–0.5)
Eosinophils Relative: 4 %
HCT: 46.3 % (ref 39.0–52.0)
Hemoglobin: 14 g/dL (ref 13.0–17.0)
Immature Granulocytes: 0 %
Lymphocytes Relative: 12 %
Lymphs Abs: 0.8 10*3/uL (ref 0.7–4.0)
MCH: 28.3 pg (ref 26.0–34.0)
MCHC: 30.2 g/dL (ref 30.0–36.0)
MCV: 93.5 fL (ref 80.0–100.0)
Monocytes Absolute: 0.3 10*3/uL (ref 0.1–1.0)
Monocytes Relative: 5 %
Neutro Abs: 5.1 10*3/uL (ref 1.7–7.7)
Neutrophils Relative %: 78 %
Platelets: 117 10*3/uL — ABNORMAL LOW (ref 150–400)
RBC: 4.95 MIL/uL (ref 4.22–5.81)
RDW: 16.8 % — ABNORMAL HIGH (ref 11.5–15.5)
WBC: 6.6 10*3/uL (ref 4.0–10.5)
nRBC: 0 % (ref 0.0–0.2)

## 2021-01-14 LAB — COMPREHENSIVE METABOLIC PANEL
ALT: 15 U/L (ref 0–44)
AST: 14 U/L — ABNORMAL LOW (ref 15–41)
Albumin: 3.7 g/dL (ref 3.5–5.0)
Alkaline Phosphatase: 107 U/L (ref 38–126)
Anion gap: 9 (ref 5–15)
BUN: 34 mg/dL — ABNORMAL HIGH (ref 8–23)
CO2: 33 mmol/L — ABNORMAL HIGH (ref 22–32)
Calcium: 8.8 mg/dL — ABNORMAL LOW (ref 8.9–10.3)
Chloride: 100 mmol/L (ref 98–111)
Creatinine, Ser: 2.42 mg/dL — ABNORMAL HIGH (ref 0.61–1.24)
GFR, Estimated: 28 mL/min — ABNORMAL LOW (ref >60)
Glucose, Bld: 89 mg/dL (ref 70–99)
Potassium: 3.9 mmol/L (ref 3.5–5.1)
Sodium: 142 mmol/L (ref 135–145)
Total Bilirubin: 0.9 mg/dL (ref 0.3–1.2)
Total Protein: 7.1 g/dL (ref 6.5–8.1)

## 2021-01-14 LAB — TROPONIN I (HIGH SENSITIVITY)
Troponin I (High Sensitivity): 33 ng/L — ABNORMAL HIGH (ref <18)
Troponin I (High Sensitivity): 34 ng/L — ABNORMAL HIGH (ref <18)

## 2021-01-14 LAB — BRAIN NATRIURETIC PEPTIDE: B Natriuretic Peptide: 163 pg/mL — ABNORMAL HIGH (ref 0.0–100.0)

## 2021-01-14 MED ORDER — FUROSEMIDE 10 MG/ML IJ SOLN
40.0000 mg | Freq: Once | INTRAMUSCULAR | Status: AC
  Administered 2021-01-14: 40 mg via INTRAVENOUS
  Filled 2021-01-14: qty 4

## 2021-01-14 MED ORDER — IPRATROPIUM-ALBUTEROL 0.5-2.5 (3) MG/3ML IN SOLN
3.0000 mL | Freq: Once | RESPIRATORY_TRACT | Status: AC
  Administered 2021-01-14: 3 mL via RESPIRATORY_TRACT
  Filled 2021-01-14: qty 3

## 2021-01-14 NOTE — ED Triage Notes (Signed)
Patient arrives from, c/o SOB, stated he has been taking lasix as prescribed, on 4 liters [place], saturation 97%.

## 2021-01-14 NOTE — ED Provider Notes (Signed)
[facility] EMERGENCY DEPARTMENT Provider Note   CSN: [id number] Arrival date & time: 01/14/21  1503     History Chief Complaint  Patient presents with   Shortness of Breath    [name] [name] is a 75 y.o. male.   Shortness of Breath Associated symptoms: cough and wheezing   Associated symptoms: no abdominal pain, no chest pain, no fever and no rash   Patient presents to the hospital for shortness of breath.  History of COPD and CHF.  Is on chronic oxygen at 3 L.  Increased to 4 L about an hour before arrival.  Recent admission for same.  Had been discharged on June 2 to skilled nursing however states he left after only today because he did not like it there.  States he has been going downhill since he went to rehab.  States he is swelling on his legs.  States it may even be less than it was before.  Has had a cough with some mild sputum production.  Denies fevers.  Denies chest pain.  Is on Eliquis for atrial fibrillation.    Past Medical History:  Diagnosis Date   Atrial fibrillation ([place])    CAD (coronary artery disease)    DES to LAD June 2018   CKD (chronic kidney disease) stage 3, GFR 30-59 ml/min (HCC)    Essential hypertension    History of pneumonia    Hyperlipidemia    Hypothyroidism    LBBB (left bundle branch block)    Morbid obesity ([place])    Non Hodgkin's lymphoma ([place])    Status post XRT and chemotherapy   NSTEMI (non-ST elevated myocardial infarction) [facility])    June 2018   Peripheral neuropathy    Pneumonia due to COVID-19 virus    Sleep apnea    Type 2 diabetes mellitus ([place])     Patient Active Problem List   Diagnosis Date Noted   CHF exacerbation ([place]) 12/28/2020   CHF (congestive heart failure) ([place]) 12/28/2020   Acute on chronic diastolic HF (heart failure) ([place]) 10/06/2020   Chronic respiratory failure with hypoxia and hypercapnia ([place]) 09/10/2020   Former smoker 08/10/2020   COPD (chronic obstructive pulmonary disease) ([place]) 08/10/2020   Acute  on chronic diastolic CHF (congestive heart failure) ([place]) 08/05/2020   Acute respiratory failure with hypoxia ([place]) 08/04/2020   Acute exacerbation of CHF (congestive heart failure) ([place]) 07/19/2020   AF (paroxysmal atrial fibrillation) ([place]) 07/19/2020   COVID-19    Acute on chronic respiratory failure with hypoxia ([place]) 06/25/2020   Aspiration pneumonia ([place]) 06/24/2020   Pneumonia due to COVID-19 virus 06/24/2020   Confusion    Small bowel obstruction ([place]) 01/29/2020   SBO (small bowel obstruction) ([place]) 01/28/2020   Primary osteoarthritis of left knee 05/24/2018   Primary localized osteoarthritis of left knee 05/19/2018   Acute on chronic systolic and diastolic heart failure, NYHA class 1 ([place]) 04/01/2017   CAD (coronary artery disease) 04/01/2017   DOE (dyspnea on exertion) 03/21/2017   HTN (hypertension) 03/21/2017   NSTEMI (non-ST elevated myocardial infarction) ([place]) 01/19/2017   NSTEMI, initial episode of care ([place]) 01/19/2017   Sepsis ([place]) 07/24/2016   Elevated troponin I level 07/24/2016   Fever 07/24/2016   Lactic acidosis 07/24/2016   Adjustment insomnia 05/18/2016   Erectile dysfunction 12/19/2015   Sleep apnea 09/30/2015   Osteoarthritis 09/30/2015   Hypothyroidism 09/30/2015   Hypogonadism in male 09/30/2015   Diabetic neuropathy ([place]) 09/30/2015   Chronic pain of left knee 09/30/2015  Benign essential hypertension 09/30/2015   Acute on chronic renal failure ([place]) 05/14/2015   Diarrhea 05/14/2015   Dehydration 05/14/2015   Hypokalemia 05/14/2015   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   Pelvic mass in male    Varices, esophageal (HCC)    Colonic mass    Abnormal CT scan, pelvis    Bladder mass    Elevated liver enzymes    Elevated LFTs    Abdominal pain 07/23/2014   Chronic kidney disease Stage IV 07/23/2014   Morbid obesity ([place]) 07/23/2014   Type 2 diabetes mellitus with stage 4 chronic kidney disease ([place]) 07/23/2014   Hypertension 07/23/2014    S/P total knee replacement, 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    Past Surgical History:  Procedure Laterality Date   CHOLECYSTECTOMY  1992   COLONOSCOPY N/A 09/03/2014   SLF:six colon polyps removed/small internal hemorrhoids   CORONARY STENT INTERVENTION N/A 01/21/2017   Procedure: Coronary Stent Intervention;  Surgeon: Nelva Bush, MD;  Location: [facility];  Service: Cardiovascular;  Laterality: N/A;   ESOPHAGOGASTRODUODENOSCOPY N/A 09/03/2014   SLF: mild gastritis/few gastric polyps   LEFT HEART CATH AND CORONARY ANGIOGRAPHY N/A 01/20/2017   Procedure: Left Heart Cath and Coronary Angiography;  Surgeon: Jettie Booze, MD;  Location: [facility];  Service: Cardiovascular;  Laterality: N/A;   TOTAL KNEE ARTHROPLASTY  11/10/2011   Procedure: TOTAL KNEE ARTHROPLASTY;  Surgeon: Mauri Pole, MD;  Location: [facility];  Service: Orthopedics;  Laterality: Right;   TOTAL KNEE ARTHROPLASTY Left 05/24/2018   Procedure: LEFT TOTAL KNEE ARTHROPLASTY;  Surgeon: Melrose Nakayama, MD;  Location: [facility];  Service: Orthopedics;  Laterality: Left;       Family History  Problem Relation Age of Onset   Cancer Mother        breast and lung   Cancer Father        bladder   Cancer Maternal Uncle        prostate   Cancer Paternal Uncle        esophagus   Colon cancer Neg Hx     Social History   Tobacco Use   Smoking status: Former    Packs/day: 0.25    Years: 30.00    Pack years: 7.50    Types: Cigarettes    Quit date: 10/2019    Years since quitting: 1.2   Smokeless tobacco: Never  Vaping Use   Vaping Use: Never used  Substance Use Topics   Alcohol use: No    Alcohol/week: 0.0 standard drinks   Drug use: No    Home Medications Prior to Admission medications   Medication Sig Start Date End Date Taking? Authorizing Provider  acetaminophen (TYLENOL) 325 MG tablet Take 2 tablets (650 mg total) by mouth every 6 (six) hours as needed for mild pain (or Fever >/= 101). 02/01/20   Roxan Hockey, MD  albuterol (VENTOLIN HFA) 108 (90 Base) MCG/ACT inhaler Inhale 1-2 puffs into the lungs every 6 (six) hours as needed for wheezing or shortness of breath. 01/10/19   Long, Wonda Olds, MD  amLODipine (NORVASC) 10 MG tablet Take 10 mg by mouth daily.    [provider]  apixaban (ELIQUIS) 5 MG TABS tablet Take 1 tablet (5 mg total) by mouth 2 (two) times daily. This is a dose change 01/07/21   Verta Ellen., NP  aspirin EC 81 MG tablet Take 81 mg by mouth daily. Swallow whole.    [provider]  bisoprolol (  ZEBETA) 5 MG tablet Take 5 mg by mouth daily. 11/26/20   [provider]  chlorhexidine (PERIDEX) 0.12 % solution 15 mLs by Mouth Rinse route 2 (two) times daily. 12/31/20   Shahmehdi, Valeria Batman, MD  fluticasone (FLONASE) 50 MCG/ACT nasal spray Place 2 sprays into both nostrils as needed for up to 14 days. 08/10/20 08/24/20  Murlean Iba, MD  furosemide (LASIX) 80 MG tablet Take 0.5 tablets (40 mg total) by mouth daily. 01/01/21   Shahmehdi, Valeria Batman, MD  insulin aspart (NOVOLOG) 100 UNIT/ML injection Inject 0-15 Units into the skin 3 (three) times daily with meals. 12/31/20 01/30/21  Shahmehdi, Valeria Batman, MD  insulin degludec (TRESIBA) 100 UNIT/ML FlexTouch Pen Inject 59 Units into the skin daily at 10 pm.    [provider]  ipratropium-albuterol (DUONEB) 0.5-2.5 (3) MG/3ML SOLN Inhale 3 mLs into the lungs every 4 (four) hours as needed (shortness of breath). 05/22/20   [provider]  isosorbide mononitrate (IMDUR) 30 MG 24 hr tablet Take 1 tablet (30 mg total) by mouth daily. 12/04/20   Satira Sark, MD  levocetirizine (XYZAL) 5 MG tablet Take 5 mg by mouth every evening.  08/27/17   [provider]  levothyroxine (SYNTHROID, LEVOTHROID) 200 MCG tablet Take 300 mcg by mouth daily before breakfast. (takes with 139mcg for a total of 384mcg)    [provider]  methylPREDNISolone (MEDROL DOSEPAK) 4 MG TBPK tablet Medrol Dosepak  take as instructed 12/31/20   Shahmehdi, Valeria Batman, MD  Mouthwashes (MOUTH RINSE) LIQD solution 15 mLs by Mouth Rinse route 2 times daily at 12 noon and 4 pm. 12/31/20 01/30/21  Shahmehdi, Valeria Batman, MD  nitroGLYCERIN (NITROSTAT) 0.4 MG SL tablet Place 0.4 mg under the tongue every 5 (five) minutes as needed for chest pain.  12/27/17   [provider]  nystatin (MYCOSTATIN/NYSTOP) powder Apply topically 2 (two) times daily. Patient not taking: No sig reported 10/11/20   Manuella Ghazi, Pratik D, DO  OXYGEN Inhale 3 L into the lungs daily as needed.    [provider]  potassium chloride SA (KLOR-CON) 20 MEQ tablet Take 1 tablet (20 mEq total) by mouth 2 (two) times daily. Patient not taking: Reported on 12/30/2020 08/10/20   Murlean Iba, MD  senna-docusate (SENOKOT-S) 8.6-50 MG tablet Take 1 tablet by mouth daily as needed for mild constipation. 02/01/20   [provider]  simvastatin (ZOCOR) 5 MG tablet Take 5 mg by mouth daily. 09/14/16   [provider]  SYMBICORT 160-4.5 MCG/ACT inhaler Inhale 2 puffs into the lungs daily as needed (shortness of breath). 12/22/19   [provider]    Allergies    Patient has no known allergies.  Review of Systems   Review of Systems  Constitutional:  Positive for fatigue. Negative for appetite change and fever.  HENT:  Negative for congestion.   Respiratory:  Positive for cough, shortness of breath and wheezing.   Cardiovascular:  Positive for leg swelling. Negative for chest pain.  Gastrointestinal:  Negative for abdominal pain.  Genitourinary:  Negative for flank pain.  Musculoskeletal:  Negative for back pain.  Skin:  Negative for rash.  Neurological:  Negative for weakness.  Psychiatric/Behavioral:  Negative for confusion.    Physical Exam Updated Vital Signs BP 137/77   Pulse 78   Temp 98 F (36.7 C) (Oral)   Resp 19   Ht 6\' 1"  (1.854 m)   Wt (!) 142.9 kg   SpO2 93%  BMI 41.56 kg/m   Physical Exam Vitals  and nursing note reviewed.  HENT:     Head: Normocephalic.  Cardiovascular:     Rate and Rhythm: Rhythm irregular.  Pulmonary:     Comments: Some rales bilaterally but particularly right base.  Mild wheezes.  Mild tachypnea. Chest:     Chest wall: No tenderness.  Abdominal:     Tenderness: There is no abdominal tenderness.  Musculoskeletal:     Right lower leg: Edema present.     Left lower leg: Edema present.     Comments: Moderate pitting edema bilateral lower extremities.  Skin:    General: Skin is warm.     Capillary Refill: Capillary refill takes less than 2 seconds.  Neurological:     Mental Status: He is alert and oriented to person, place, and time.    ED Results / Procedures / Treatments   Labs (all labs ordered are listed, but only abnormal results are displayed) Labs Reviewed  COMPREHENSIVE METABOLIC PANEL - Abnormal; Notable for the following components:      Result Value   CO2 33 (*)    BUN 34 (*)    Creatinine, Ser 2.42 (*)    Calcium 8.8 (*)    AST 14 (*)    GFR, Estimated 28 (*)    All other components within normal limits  BRAIN NATRIURETIC PEPTIDE - Abnormal; Notable for the following components:   B Natriuretic Peptide 163.0 (*)    All other components within normal limits  CBC WITH DIFFERENTIAL/PLATELET - Abnormal; Notable for the following components:   RDW 16.8 (*)    Platelets 117 (*)    All other components within normal limits  TROPONIN I (HIGH SENSITIVITY) - Abnormal; Notable for the following components:   Troponin I (High Sensitivity) 33 (*)    All other components within normal limits  TROPONIN I (HIGH SENSITIVITY) - Abnormal; Notable for the following components:   Troponin I (High Sensitivity) 34 (*)    All other components within normal limits    EKG EKG Interpretation  Date/Time:  Tuesday January 14 2021 15:40:37 EDT Ventricular Rate:  81 PR Interval:    QRS Duration: 126 QT Interval:  413 QTC Calculation: 480 R  Axis:   161 Text Interpretation: Atrial fibrillation Nonspecific intraventricular conduction delay Anterior infarct, old Baseline wander in lead(s) I II aVR V2 V3 V4 V5 V6 Confirmed by Davonna Belling [phone]) on 01/14/2021 4:03:58 PM  Radiology DG Chest Portable 1 View  Result Date: 01/14/2021 CLINICAL DATA:  Shortness of breath EXAM: PORTABLE CHEST 1 VIEW COMPARISON:  12/28/2020 FINDINGS: Low lung volumes. Elevation of the right hemidiaphragm. There is improved aeration at the left lung base with patchy opacity remaining. Pulmonary vascular congestion with probable mild edema. No significant pleural effusion. Similar cardiomediastinal contours with mild cardiomegaly. IMPRESSION: Pulmonary vascular congestion with probable mild pulmonary edema. Improved aeration at the left lung base with patchy atelectasis/consolidation remaining. Electronically Signed   By: Macy Mis M.D.   On: 01/14/2021 16:13    Procedures Procedures   Medications Ordered in ED Medications  furosemide (LASIX) injection 40 mg (40 mg Intravenous Given 01/14/21 1834)  ipratropium-albuterol (DUONEB) 0.5-2.5 (3) MG/3ML nebulizer solution 3 mL (3 mLs Nebulization Given 01/14/21 1855)    ED Course  I have reviewed the triage vital signs and the nursing notes.  Pertinent labs & imaging results that were available during my care of the patient were reviewed by me and considered in my medical  decision making (see chart for details).    MDM Rules/Calculators/A&P                         Patient shortness of breath and cough.  Chronic oxygen.  History of CHF and COPD.  I think today is likely combination of both but I think more likely CHF contributing.  BNP elevated but not as high as it had been previously.  Has been given Lasix and felt better.  Also had been given breathing treatment.  Improved wheezing with the breathing treatment.  Since breathing improved without any steroids.  Will increase Lasix for a few days.   Discharge home with outpatient follow-up.  Doubt pneumonia.  Final Clinical Impression(s) / ED Diagnoses Final diagnoses:  Acute on chronic congestive heart failure, unspecified heart failure type ([place])  COPD exacerbation ([place])    Rx / DC Orders ED Discharge Orders     None        Davonna Belling, MD 01/15/21 0023

## 2021-01-14 NOTE — Discharge Instructions (Addendum)
Increase your Lasix to a full pill for the next 3 days.  Continue your inhalers and breathing treatments.  Follow-up with your doctors.  Your lungs have opened up and I am not doing steroids because it can cause you to retain more fluid

## 2021-01-19 ENCOUNTER — Other Ambulatory Visit: Payer: Self-pay

## 2021-01-19 ENCOUNTER — Encounter (HOSPITAL_COMMUNITY): Payer: Self-pay | Admitting: Emergency Medicine

## 2021-01-19 ENCOUNTER — Emergency Department (HOSPITAL_COMMUNITY): Payer: Medicare HMO

## 2021-01-19 ENCOUNTER — Inpatient Hospital Stay (HOSPITAL_COMMUNITY)
Admission: EM | Admit: 2021-01-19 | Discharge: 2021-01-24 | DRG: 291 | Disposition: A | Discharge status: Home Health | Payer: Medicare HMO | Attending: Family Medicine | Admitting: Family Medicine

## 2021-01-19 DIAGNOSIS — I10 Essential (primary) hypertension: Secondary | ICD-10-CM | POA: Diagnosis present

## 2021-01-19 DIAGNOSIS — M7989 Other specified soft tissue disorders: Secondary | ICD-10-CM | POA: Diagnosis present

## 2021-01-19 DIAGNOSIS — I509 Heart failure, unspecified: Secondary | ICD-10-CM

## 2021-01-19 DIAGNOSIS — I130 Hypertensive heart and chronic kidney disease with heart failure and stage 1 through stage 4 chronic kidney disease, or unspecified chronic kidney disease: Principal | ICD-10-CM | POA: Diagnosis present

## 2021-01-19 DIAGNOSIS — Z79890 Hormone replacement therapy (postmenopausal): Secondary | ICD-10-CM

## 2021-01-19 DIAGNOSIS — Z87891 Personal history of nicotine dependence: Secondary | ICD-10-CM

## 2021-01-19 DIAGNOSIS — I2510 Atherosclerotic heart disease of native coronary artery without angina pectoris: Secondary | ICD-10-CM | POA: Diagnosis present

## 2021-01-19 DIAGNOSIS — E1122 Type 2 diabetes mellitus with diabetic chronic kidney disease: Secondary | ICD-10-CM | POA: Diagnosis present

## 2021-01-19 DIAGNOSIS — Z6841 Body Mass Index (BMI) 40.0 and over, adult: Secondary | ICD-10-CM

## 2021-01-19 DIAGNOSIS — G4733 Obstructive sleep apnea (adult) (pediatric): Secondary | ICD-10-CM | POA: Diagnosis present

## 2021-01-19 DIAGNOSIS — Z8572 Personal history of non-Hodgkin lymphomas: Secondary | ICD-10-CM

## 2021-01-19 DIAGNOSIS — I447 Left bundle-branch block, unspecified: Secondary | ICD-10-CM | POA: Diagnosis present

## 2021-01-19 DIAGNOSIS — N184 Chronic kidney disease, stage 4 (severe): Secondary | ICD-10-CM | POA: Diagnosis not present

## 2021-01-19 DIAGNOSIS — R778 Other specified abnormalities of plasma proteins: Secondary | ICD-10-CM

## 2021-01-19 DIAGNOSIS — D696 Thrombocytopenia, unspecified: Secondary | ICD-10-CM

## 2021-01-19 DIAGNOSIS — I5033 Acute on chronic diastolic (congestive) heart failure: Secondary | ICD-10-CM | POA: Diagnosis not present

## 2021-01-19 DIAGNOSIS — Z923 Personal history of irradiation: Secondary | ICD-10-CM

## 2021-01-19 DIAGNOSIS — I482 Chronic atrial fibrillation, unspecified: Secondary | ICD-10-CM | POA: Diagnosis not present

## 2021-01-19 DIAGNOSIS — N179 Acute kidney failure, unspecified: Secondary | ICD-10-CM | POA: Diagnosis present

## 2021-01-19 DIAGNOSIS — Z79899 Other long term (current) drug therapy: Secondary | ICD-10-CM

## 2021-01-19 DIAGNOSIS — Z7982 Long term (current) use of aspirin: Secondary | ICD-10-CM

## 2021-01-19 DIAGNOSIS — I252 Old myocardial infarction: Secondary | ICD-10-CM

## 2021-01-19 DIAGNOSIS — J441 Chronic obstructive pulmonary disease with (acute) exacerbation: Secondary | ICD-10-CM | POA: Diagnosis present

## 2021-01-19 DIAGNOSIS — E785 Hyperlipidemia, unspecified: Secondary | ICD-10-CM | POA: Diagnosis present

## 2021-01-19 DIAGNOSIS — R7989 Other specified abnormal findings of blood chemistry: Secondary | ICD-10-CM

## 2021-01-19 DIAGNOSIS — J9621 Acute and chronic respiratory failure with hypoxia: Secondary | ICD-10-CM | POA: Diagnosis not present

## 2021-01-19 DIAGNOSIS — Z20822 Contact with and (suspected) exposure to covid-19: Secondary | ICD-10-CM | POA: Diagnosis present

## 2021-01-19 DIAGNOSIS — J9622 Acute and chronic respiratory failure with hypercapnia: Secondary | ICD-10-CM

## 2021-01-19 DIAGNOSIS — E1142 Type 2 diabetes mellitus with diabetic polyneuropathy: Secondary | ICD-10-CM | POA: Diagnosis present

## 2021-01-19 DIAGNOSIS — Z794 Long term (current) use of insulin: Secondary | ICD-10-CM

## 2021-01-19 DIAGNOSIS — Z7901 Long term (current) use of anticoagulants: Secondary | ICD-10-CM

## 2021-01-19 DIAGNOSIS — E872 Acidosis, unspecified: Secondary | ICD-10-CM | POA: Diagnosis present

## 2021-01-19 DIAGNOSIS — J9611 Chronic respiratory failure with hypoxia: Secondary | ICD-10-CM

## 2021-01-19 DIAGNOSIS — Z9981 Dependence on supplemental oxygen: Secondary | ICD-10-CM

## 2021-01-19 DIAGNOSIS — R0602 Shortness of breath: Secondary | ICD-10-CM | POA: Diagnosis not present

## 2021-01-19 DIAGNOSIS — R9431 Abnormal electrocardiogram [ECG] [EKG]: Secondary | ICD-10-CM

## 2021-01-19 DIAGNOSIS — Z8701 Personal history of pneumonia (recurrent): Secondary | ICD-10-CM

## 2021-01-19 DIAGNOSIS — Z9049 Acquired absence of other specified parts of digestive tract: Secondary | ICD-10-CM

## 2021-01-19 DIAGNOSIS — Z96653 Presence of artificial knee joint, bilateral: Secondary | ICD-10-CM | POA: Diagnosis present

## 2021-01-19 DIAGNOSIS — Z8616 Personal history of COVID-19: Secondary | ICD-10-CM

## 2021-01-19 DIAGNOSIS — N189 Chronic kidney disease, unspecified: Secondary | ICD-10-CM | POA: Diagnosis present

## 2021-01-19 DIAGNOSIS — Z9861 Coronary angioplasty status: Secondary | ICD-10-CM

## 2021-01-19 DIAGNOSIS — J90 Pleural effusion, not elsewhere classified: Secondary | ICD-10-CM

## 2021-01-19 DIAGNOSIS — E039 Hypothyroidism, unspecified: Secondary | ICD-10-CM | POA: Diagnosis present

## 2021-01-19 DIAGNOSIS — Z9221 Personal history of antineoplastic chemotherapy: Secondary | ICD-10-CM

## 2021-01-19 DIAGNOSIS — R0902 Hypoxemia: Secondary | ICD-10-CM

## 2021-01-19 LAB — RESP PANEL BY RT-PCR (FLU A&B, COVID) ARPGX2
Influenza A by PCR: NEGATIVE
Influenza B by PCR: NEGATIVE
SARS Coronavirus 2 by RT PCR: NEGATIVE

## 2021-01-19 LAB — COMPREHENSIVE METABOLIC PANEL
ALT: 11 U/L (ref 0–44)
AST: 11 U/L — ABNORMAL LOW (ref 15–41)
Albumin: 3.9 g/dL (ref 3.5–5.0)
Alkaline Phosphatase: 106 U/L (ref 38–126)
Anion gap: 10 (ref 5–15)
BUN: 41 mg/dL — ABNORMAL HIGH (ref 8–23)
CO2: 33 mmol/L — ABNORMAL HIGH (ref 22–32)
Calcium: 8.7 mg/dL — ABNORMAL LOW (ref 8.9–10.3)
Chloride: 99 mmol/L (ref 98–111)
Creatinine, Ser: 2.44 mg/dL — ABNORMAL HIGH (ref 0.61–1.24)
GFR, Estimated: 27 mL/min — ABNORMAL LOW (ref >60)
Glucose, Bld: 118 mg/dL — ABNORMAL HIGH (ref 70–99)
Potassium: 3.5 mmol/L (ref 3.5–5.1)
Sodium: 142 mmol/L (ref 135–145)
Total Bilirubin: 0.6 mg/dL (ref 0.3–1.2)
Total Protein: 7.3 g/dL (ref 6.5–8.1)

## 2021-01-19 LAB — D-DIMER, QUANTITATIVE: D-Dimer, Quant: 2.44 ug/mL-FEU — ABNORMAL HIGH (ref 0.00–0.50)

## 2021-01-19 LAB — TROPONIN I (HIGH SENSITIVITY)
Troponin I (High Sensitivity): 41 ng/L — ABNORMAL HIGH (ref <18)
Troponin I (High Sensitivity): 42 ng/L — ABNORMAL HIGH (ref <18)

## 2021-01-19 LAB — CBC
HCT: 47.3 % (ref 39.0–52.0)
Hemoglobin: 14.1 g/dL (ref 13.0–17.0)
MCH: 28.1 pg (ref 26.0–34.0)
MCHC: 29.8 g/dL — ABNORMAL LOW (ref 30.0–36.0)
MCV: 94.4 fL (ref 80.0–100.0)
Platelets: 126 10*3/uL — ABNORMAL LOW (ref 150–400)
RBC: 5.01 MIL/uL (ref 4.22–5.81)
RDW: 16.6 % — ABNORMAL HIGH (ref 11.5–15.5)
WBC: 6.5 10*3/uL (ref 4.0–10.5)
nRBC: 0 % (ref 0.0–0.2)

## 2021-01-19 LAB — LACTIC ACID, PLASMA
Lactic Acid, Venous: 2 mmol/L (ref 0.5–1.9)
Lactic Acid, Venous: 1.5 mmol/L (ref 0.5–1.9)

## 2021-01-19 LAB — BLOOD GAS, VENOUS
Acid-Base Excess: 7 mmol/L — ABNORMAL HIGH (ref 0.0–2.0)
Bicarbonate: 27.9 mmol/L (ref 20.0–28.0)
FIO2: 56
O2 Saturation: 71.1 %
Patient temperature: 36.4
pCO2, Ven: 69.3 mmHg — ABNORMAL HIGH (ref 44.0–60.0)
pH, Ven: 7.302 (ref 7.250–7.430)
pO2, Ven: 40.6 mmHg (ref 32.0–45.0)

## 2021-01-19 LAB — BRAIN NATRIURETIC PEPTIDE: B Natriuretic Peptide: 163 pg/mL — ABNORMAL HIGH (ref 0.0–100.0)

## 2021-01-19 MED ORDER — FUROSEMIDE 10 MG/ML IJ SOLN
80.0000 mg | Freq: Once | INTRAMUSCULAR | Status: AC
  Administered 2021-01-19: 80 mg via INTRAVENOUS
  Filled 2021-01-19: qty 8

## 2021-01-19 NOTE — ED Provider Notes (Signed)
[facility] EMERGENCY DEPARTMENT Provider Note   CSN: [id number] Arrival date & time: 01/19/21  1918     History Chief Complaint  Patient presents with   Shortness of Breath    [name] [name] is a 75 y.o. male.  HPI The patient arrived here by EMS today.  He was found hypoxic on 4 L of oxygen and his O2  He presents for persistent shortness of breath for several months since COVID infection last year.  He was in the ED, several days ago at that time treated and discharged.  He does not recall that his medications have been changed.  He states he been told he had sleep apnea in the past but not on CPAP.  He has occasional cough.  He denies fever, chills, chest pain, nausea, vomiting, focal weakness or paresthesia.  He is taking his usual medications, without relief.  Patient was hospitalized and discharged on 01/02/2021, to a rehab facility.  He left that facility because "he did not like it there."  He has chronic leg swelling but denies leg pain.  When seen here on 01/14/2021, he was instructed to increase his Lasix.  Patient does not recall that.  There are no other known active modifying factors.    Past Medical History:  Diagnosis Date   Atrial fibrillation ([place])    CAD (coronary artery disease)    DES to LAD June 2018   CKD (chronic kidney disease) stage 3, GFR 30-59 ml/min (HCC)    Essential hypertension    History of pneumonia    Hyperlipidemia    Hypothyroidism    LBBB (left bundle branch block)    Morbid obesity ([place])    Non Hodgkin's lymphoma ([place])    Status post XRT and chemotherapy   NSTEMI (non-ST elevated myocardial infarction) [facility])    June 2018   Peripheral neuropathy    Pneumonia due to COVID-19 virus    Sleep apnea    Type 2 diabetes mellitus ([place])     Patient Active Problem List   Diagnosis Date Noted   CHF exacerbation ([place]) 12/28/2020   CHF (congestive heart failure) ([place]) 12/28/2020   Acute on chronic diastolic HF (heart failure) ([place]) 10/06/2020    Chronic respiratory failure with hypoxia and hypercapnia ([place]) 09/10/2020   Former smoker 08/10/2020   COPD (chronic obstructive pulmonary disease) ([place]) 08/10/2020   Acute on chronic diastolic CHF (congestive heart failure) ([place]) 08/05/2020   Acute respiratory failure with hypoxia ([place]) 08/04/2020   Acute exacerbation of CHF (congestive heart failure) ([place]) 07/19/2020   AF (paroxysmal atrial fibrillation) ([place]) 07/19/2020   COVID-19    Acute on chronic respiratory failure with hypoxia ([place]) 06/25/2020   Aspiration pneumonia ([place]) 06/24/2020   Pneumonia due to COVID-19 virus 06/24/2020   Confusion    Small bowel obstruction ([place]) 01/29/2020   SBO (small bowel obstruction) ([place]) 01/28/2020   Primary osteoarthritis of left knee 05/24/2018   Primary localized osteoarthritis of left knee 05/19/2018   Acute on chronic systolic and diastolic heart failure, NYHA class 1 ([place]) 04/01/2017   CAD (coronary artery disease) 04/01/2017   DOE (dyspnea on exertion) 03/21/2017   HTN (hypertension) 03/21/2017   NSTEMI (non-ST elevated myocardial infarction) ([place]) 01/19/2017   NSTEMI, initial episode of care ([place]) 01/19/2017   Sepsis ([place]) 07/24/2016   Elevated troponin I level 07/24/2016   Fever 07/24/2016   Lactic acidosis 07/24/2016   Adjustment insomnia 05/18/2016   Erectile dysfunction 12/19/2015   Sleep apnea 09/30/2015  Osteoarthritis 09/30/2015   Hypothyroidism 09/30/2015   Hypogonadism in male 09/30/2015   Diabetic neuropathy ([place]) 09/30/2015   Chronic pain of left knee 09/30/2015   Benign essential hypertension 09/30/2015   Acute on chronic renal failure ([place]) 05/14/2015   Diarrhea 05/14/2015   Dehydration 05/14/2015   Hypokalemia 05/14/2015   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   Pelvic mass in male    Varices, esophageal (HCC)    Colonic mass    Abnormal CT scan, pelvis    Bladder mass    Elevated liver enzymes    Elevated LFTs    Abdominal pain 07/23/2014   Chronic  kidney disease Stage IV 07/23/2014   Morbid obesity ([place]) 07/23/2014   Type 2 diabetes mellitus with stage 4 chronic kidney disease ([place]) 07/23/2014   Hypertension 07/23/2014   S/P total knee replacement, 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    Past Surgical History:  Procedure Laterality Date   CHOLECYSTECTOMY  1992   COLONOSCOPY N/A 09/03/2014   SLF:six colon polyps removed/small internal hemorrhoids   CORONARY STENT INTERVENTION N/A 01/21/2017   Procedure: Coronary Stent Intervention;  Surgeon: Nelva Bush, MD;  Location: [facility];  Service: Cardiovascular;  Laterality: N/A;   ESOPHAGOGASTRODUODENOSCOPY N/A 09/03/2014   SLF: mild gastritis/few gastric polyps   LEFT HEART CATH AND CORONARY ANGIOGRAPHY N/A 01/20/2017   Procedure: Left Heart Cath and Coronary Angiography;  Surgeon: Jettie Booze, MD;  Location: [facility];  Service: Cardiovascular;  Laterality: N/A;   TOTAL KNEE ARTHROPLASTY  11/10/2011   Procedure: TOTAL KNEE ARTHROPLASTY;  Surgeon: Mauri Pole, MD;  Location: [facility];  Service: Orthopedics;  Laterality: Right;   TOTAL KNEE ARTHROPLASTY Left 05/24/2018   Procedure: LEFT TOTAL KNEE ARTHROPLASTY;  Surgeon: Melrose Nakayama, MD;  Location: [facility];  Service: Orthopedics;  Laterality: Left;       Family History  Problem Relation Age of Onset   Cancer Mother        breast and lung   Cancer Father        bladder   Cancer Maternal Uncle        prostate   Cancer Paternal Uncle        esophagus   Colon cancer Neg Hx     Social History   Tobacco Use   Smoking status: Former    Packs/day: 0.25    Years: 30.00    Pack years: 7.50    Types: Cigarettes    Quit date: 10/2019    Years since quitting: 1.3   Smokeless tobacco: Never  Vaping Use   Vaping Use: Never used  Substance Use Topics   Alcohol use: No    Alcohol/week: 0.0 standard drinks   Drug use: No    Home Medications Prior to Admission medications   Medication Sig Start Date End Date Taking?  Authorizing Provider  acetaminophen (TYLENOL) 325 MG tablet Take 2 tablets (650 mg total) by mouth every 6 (six) hours as needed for mild pain (or Fever >/= 101). 02/01/20  Yes Emokpae, Courage, MD  albuterol (VENTOLIN HFA) 108 (90 Base) MCG/ACT inhaler Inhale 1-2 puffs into the lungs every 6 (six) hours as needed for wheezing or shortness of breath. 01/10/19  Yes Long, Wonda Olds, MD  amLODipine (NORVASC) 10 MG tablet Take 10 mg by mouth daily.   Yes [provider]  apixaban (ELIQUIS) 5 MG TABS tablet Take 1 tablet (5 mg total) by mouth 2 (two) times daily. This is a dose change 01/07/21  Yes Verta Ellen., NP  aspirin EC 81 MG tablet Take 81 mg by mouth daily. Swallow whole.   Yes [provider]  bisoprolol (ZEBETA) 5 MG tablet Take 5 mg by mouth daily. 11/26/20  Yes [provider]  chlorhexidine (PERIDEX) 0.12 % solution 15 mLs by Mouth Rinse route 2 (two) times daily. 12/31/20  Yes Shahmehdi, Seyed A, MD  furosemide (LASIX) 80 MG tablet Take 0.5 tablets (40 mg total) by mouth daily. 01/01/21  Yes Shahmehdi, Seyed A, MD  insulin degludec (TRESIBA) 100 UNIT/ML FlexTouch Pen Inject 56 Units into the skin daily at 10 pm.   Yes [provider]  ipratropium-albuterol (DUONEB) 0.5-2.5 (3) MG/3ML SOLN Inhale 3 mLs into the lungs every 4 (four) hours as needed (shortness of breath). 05/22/20  Yes [provider]  isosorbide mononitrate (IMDUR) 30 MG 24 hr tablet Take 1 tablet (30 mg total) by mouth daily. 12/04/20  Yes Satira Sark, MD  levocetirizine (XYZAL) 5 MG tablet Take 5 mg by mouth every evening.  08/27/17  Yes [provider]  levothyroxine (SYNTHROID) 100 MCG tablet Take 100 mcg by mouth daily before breakfast. (Takes with 200 mcg tab for a total of 300 mcg once daily)   Yes [provider]  levothyroxine (SYNTHROID, LEVOTHROID) 200 MCG tablet Take 300 mcg by mouth daily before breakfast. (takes with 146mcg tab for a total of 328mcg)    Yes [provider]  Mouthwashes (MOUTH RINSE) LIQD solution 15 mLs by Mouth Rinse route 2 times daily at 12 noon and 4 pm. 12/31/20 01/30/21 Yes Shahmehdi, Seyed A, MD  nitroGLYCERIN (NITROSTAT) 0.4 MG SL tablet Place 0.4 mg under the tongue every 5 (five) minutes as needed for chest pain.  12/27/17  Yes [provider]  OXYGEN Inhale 3-4 L into the lungs daily as needed (low oxygen).   Yes [provider]  potassium chloride SA (KLOR-CON) 20 MEQ tablet Take 1 tablet (20 mEq total) by mouth 2 (two) times daily. 08/10/20  Yes Johnson, Clanford L, MD  senna-docusate (SENOKOT-S) 8.6-50 MG tablet Take 1 tablet by mouth daily as needed for mild constipation. 02/01/20  Yes [provider]  simvastatin (ZOCOR) 5 MG tablet Take 5 mg by mouth daily. 09/14/16  Yes [provider]  SYMBICORT 160-4.5 MCG/ACT inhaler Inhale 2 puffs into the lungs daily as needed (shortness of breath). 12/22/19  Yes [provider]  fluticasone (FLONASE) 50 MCG/ACT nasal spray Place 2 sprays into both nostrils as needed for up to 14 days. Patient not taking: Reported on 01/19/2021 08/10/20 08/24/20  Irwin Brakeman L, MD  insulin aspart (NOVOLOG) 100 UNIT/ML injection Inject 0-15 Units into the skin 3 (three) times daily with meals. Patient not taking: No sig reported 12/31/20 01/30/21  Deatra James, MD  methylPREDNISolone (MEDROL DOSEPAK) 4 MG TBPK tablet Medrol Dosepak take as instructed Patient not taking: No sig reported 12/31/20   Deatra James, MD  nystatin (MYCOSTATIN/NYSTOP) powder Apply topically 2 (two) times daily. Patient not taking: No sig reported 10/11/20   Heath Lark D, DO    Allergies    Patient has no known allergies.  Review of Systems   Review of Systems  All other systems reviewed and are negative.  Physical Exam Updated Vital Signs BP (!) 146/93   Pulse 96   Temp (!) 97.5 F (36.4 C) (Oral)   Resp (!) 26   Ht 6\' 1"  (1.854 m)   Wt (!) 142.9  kg   SpO2 92%   BMI 41.56 kg/m   Physical Exam Vitals and nursing note reviewed.  Constitutional:      General: He is not in acute distress.    Appearance: He is well-developed. He is obese. He is not toxic-appearing or diaphoretic.  HENT:     Head: Normocephalic and atraumatic.     Right Ear: External ear normal.     Left Ear: External ear normal.     Nose: No congestion.  Eyes:     Conjunctiva/sclera: Conjunctivae normal.     Pupils: Pupils are equal, round, and reactive to light.  Neck:     Trachea: Phonation normal.  Cardiovascular:     Rate and Rhythm: Normal rate and regular rhythm.     Heart sounds: Normal heart sounds.  Pulmonary:     Effort: Pulmonary effort is normal. No respiratory distress.     Breath sounds: Normal breath sounds. No stridor. No wheezing or rhonchi.  Abdominal:     Palpations: Abdomen is soft.     Tenderness: There is no abdominal tenderness.  Musculoskeletal:        General: Normal range of motion.     Cervical back: Normal range of motion and neck supple.     Right lower leg: Edema present.     Left lower leg: Edema present.  Skin:    General: Skin is warm and dry.  Neurological:     Mental Status: He is alert and oriented to person, place, and time.     Cranial Nerves: No cranial nerve deficit.     Sensory: No sensory deficit.     Motor: No abnormal muscle tone.     Coordination: Coordination normal.  Psychiatric:        Mood and Affect: Mood normal.        Behavior: Behavior normal.        Thought Content: Thought content normal.        Judgment: Judgment normal.    ED Results / Procedures / Treatments   Labs (all labs ordered are listed, but only abnormal results are displayed) Labs Reviewed  CBC - Abnormal; Notable for the following components:      Result Value   MCHC 29.8 (*)    RDW 16.6 (*)    Platelets 126 (*)    All other components within normal limits  COMPREHENSIVE METABOLIC PANEL - Abnormal; Notable for the  following components:   CO2 33 (*)    Glucose, Bld 118 (*)    BUN 41 (*)    Creatinine, Ser 2.44 (*)    Calcium 8.7 (*)    AST 11 (*)    GFR, Estimated 27 (*)    All other components within normal limits  LACTIC ACID, PLASMA - Abnormal; Notable for the following components:   Lactic Acid, Venous 2.0 (*)    All other components within normal limits  BLOOD GAS, VENOUS - Abnormal; Notable for the following components:   pCO2, Ven 69.3 (*)    Acid-Base Excess 7.0 (*)    All other components within normal limits  BRAIN NATRIURETIC PEPTIDE - Abnormal; Notable for the following components:   B Natriuretic Peptide 163.0 (*)    All other components within normal limits  D-DIMER, QUANTITATIVE - Abnormal; Notable for the following components:   D-Dimer, Quant 2.44 (*)    All other components within normal limits  TROPONIN I (HIGH SENSITIVITY) - Abnormal; Notable for the following components:   Troponin  I (High Sensitivity) 41 (*)    All other components within normal limits  RESP PANEL BY RT-PCR (FLU A&B, COVID) ARPGX2  LACTIC ACID, PLASMA  TROPONIN I (HIGH SENSITIVITY)    EKG EKG Interpretation  Date/Time:  Sunday January 19 2021 19:27:17 EDT Ventricular Rate:  92 PR Interval:    QRS Duration: 148 QT Interval:  390 QTC Calculation: 483 R Axis:   -63 Text Interpretation: Atrial fibrillation Premature ventricular complexes Nonspecific IVCD with LAD Inferior infarct, old Anterior infarct, old Baseline wander in lead(s) V1 V2 Since last tracing inferior Q waves present Otherwise no significant change Confirmed by Daleen Bo [id number]) on 01/19/2021 8:58:26 PM  Radiology DG Chest Portable 1 View  Result Date: 01/19/2021 CLINICAL DATA:  Shortness of breath EXAM: PORTABLE CHEST 1 VIEW COMPARISON:  01/14/2021 FINDINGS: Stable cardiomegaly. Low lung volumes. Pulmonary vascular congestion with diffuse interstitial prominence. Small bilateral pleural effusions. No pneumothorax. IMPRESSION:  Cardiomegaly with pulmonary edema and small bilateral pleural effusions. Overall, findings are similar to the prior exam. Electronically Signed   By: Davina Poke D.O.   On: 01/19/2021 20:07    Procedures .Critical Care  Date/Time: 01/19/2021 10:35 PM Performed by: Daleen Bo, MD Authorized by: Daleen Bo, MD   Critical care provider statement:    Critical care time (minutes):  40   Critical care start time:  01/19/2021 8:20 PM   Critical care end time:  01/19/2021 10:36 PM   Critical care time was exclusive of:  Separately billable procedures and treating other patients   Critical care was time spent personally by me on the following activities:  Blood draw for specimens, development of treatment plan with patient or surrogate, discussions with consultants, evaluation of patient's response to treatment, examination of patient, obtaining history from patient or surrogate, ordering and performing treatments and interventions, ordering and review of laboratory studies, pulse oximetry, re-evaluation of patient's condition, review of old charts and ordering and review of radiographic studies   Medications Ordered in ED Medications  furosemide (LASIX) injection 80 mg (has no administration in time range)    ED Course  I have reviewed the triage vital signs and the nursing notes.  Pertinent labs & imaging results that were available during my care of the patient were reviewed by me and considered in my medical decision making (see chart for details).    MDM Rules/Calculators/A&P                           Patient Vitals for the past 24 hrs:  BP Temp Temp src Pulse Resp SpO2 Height Weight  01/19/21 2130 (!) 146/93 -- -- 96 -- 92 % -- --  01/19/21 2115 -- -- -- (!) 26 -- 93 % -- --  01/19/21 2100 (!) 146/91 -- -- 84 -- (!) 86 % -- --  01/19/21 2045 -- -- -- 70 (!) 26 95 % -- --  01/19/21 2030 125/81 -- -- 82 16 94 % -- --  01/19/21 2015 -- -- -- 74 17 (!) 89 % -- --  01/19/21  2000 140/85 -- -- 86 20 91 % -- --  01/19/21 1952 -- (!) 97.5 F (36.4 C) Oral -- -- -- -- --  01/19/21 1945 -- -- -- (!) 108 16 94 % -- --  01/19/21 1930 (!) 142/108 -- -- (!) 103 17 100 % -- --  01/19/21 1929 -- -- -- -- -- -- 6\' 1"  (1.854 m) (!) 142.9 kg  10:30 PM Reevaluation with update and discussion. After initial assessment and treatment, an updated evaluation reveals patient sitting on bedside, head slumped forward, oxygen saturation 91% while on 8 L high flow nasal cannula support.  He arouses easily and is communicative.  He complains of feeling sleepy and would like to be changed to a more comfortable bed. Daleen Bo   Medical Decision Making:  This patient is presenting for evaluation of shortness of breath, which does require a range of treatment options, and is a complaint that involves a high risk of morbidity and mortality. The differential diagnoses include elderly patient with history of CHF and COPD presenting with increased oxygen requirement, despite recent evaluation and treatment for same.  Concern for pneumonia, CHF, PE. I decided to review old records, and in summary elderly male, being maintained at home with treatment for COPD and CHF, previously diagnosed with "sleep apnea."  He is anticoagulated, for history of atrial fibrillation.  History of renal insufficiency and diabetes.  I did not require additional historical information from anyone.  Clinical Laboratory Tests Ordered, included CBC, Metabolic panel, and VBG, D-dimer, BNP . Review indicates normal except stable elevated troponin, PCO2 high, BNP high, D-dimer high, CO2 high, glucose high, BUN high, creatinine high. Radiologic Tests Ordered, included chest x-ray.  I independently Visualized: Chest x-ray images, which show bilateral pulmonary edema with effusion  Cardiac Monitor Tracing which shows atrial fibrillation   Critical Interventions-clinical evaluation, laboratory testing, radiography,  observation, oxygen support with facemask and nasal cannula, BiPAP ordered for respiratory failure with hypercapnia.  After These Interventions, the Patient was reevaluated and was found with significant hypoxia requiring increased oxygen likely related to combination of pulmonary edema, and sleep apnea.  Cannot rule out PE although he is on oral anticoagulant.  Patient was treated with high flow oxygen in the ED, an IV dose of Lasix.  He used increasing oral dose of Lasix for the last 3 days without improvement.  Patient placed on BiPAP for support and treatment of hypercapnia.  Will require hospitalization.  CRITICAL CARE-yes Performed by: Daleen Bo  Nursing Notes Reviewed/ Care Coordinated Applicable Imaging Reviewed Interpretation of Laboratory Data incorporated into ED treatment   10:36 PM-Consult complete with hospitalist. Patient case explained and discussed.  He agrees to admit patient for further evaluation and treatment. Call ended at 11:02 PM    Final Clinical Impression(s) / ED Diagnoses Final diagnoses:  Hypoxia  Abnormal EKG  Acute on chronic congestive heart failure, unspecified heart failure type ([place])  Acute on chronic respiratory failure with hypercapnia [facility])    Rx / DC Orders ED Discharge Orders     None        Daleen Bo, MD 01/19/21 2309

## 2021-01-19 NOTE — ED Triage Notes (Signed)
Pt arrives c/o SOB here recently for the same. Pt given duoneb by EMS. Pt arrives on 6L [place] sats 85%. Pt placed on NRB.   Pt on 4L [place] at home chronically.

## 2021-01-19 NOTE — ED Notes (Signed)
Pt sitting on side of bed at this time. O2 WNL. Pt denies any needs at this time. Will continue to monitor.

## 2021-01-19 NOTE — H&P (Addendum)
History and Physical  [name] [name] WJX:[id number] DOB: Mar 30, 1946 DOA: 01/19/2021  Referring physician: Daleen Bo, MD  PCP: Nickola Major, MD  Patient coming from: Home  Chief Complaint: Shortness of breath  HPI: [name] [name] is a 75 y.o. male with medical history significant for  type 2 diabetes mellitus, .Class III  obesity, coronary artery disease, non-Hodgkin's lymphoma, hypothyroidism, hyperlipidemia, essential hypertension, CKD stage IV, atrial fibrillation, OSA, chronic respiratory failure on supplemental oxygen via [place] at 3 L/min who presents to the emergency department due to worsening shortness of breath.  He states that he could barely walk 10 to 15 feet without being short of breath. Patient states that he had COVID-19 virus in November of last year and that he has had persistent shortness of breath since then.  He was recently seen in the ED on 6/14 due to acute on chronic CHF and COPD exacerbation during which he was treated with IV Lasix 40 mg and breathing treatment with DuoNeb was provided and patient was discharged home.,  He was recently admitted from 5/28-6/2 due to acute respiratory failure with hypoxia secondary to CHF exacerbation  after which he was discharged to a rehab facility, but patient left the facility because "he did not like it".  He complained of chronic leg swelling.  Of note, when patient was seen in the ED on 6/14, he was asked to increase his Lasix dose, however, patient appears not to have increased this.  He denies chest pain, fever, chills, nausea or vomiting.  ED Course:  In the emergency department, he was hemodynamically stable.  BP was 118/76 at bedside.  Work-up in the ED showed normal CBC except for thrombocytopenia, BUN/creatinine 41/2.44 (creatinine within baseline range).  Lactic acid 2.0, D-dimer 2.44, BNP 163 (chronically elevated), troponin x2- 41 > 42, influenza A, B, SARS coronavirus 2 was negative.  Chest x-ray showed cardiomegaly with  pulmonary edema and small bilateral pleural effusions with findings similar to prior exam.  IV Lasix 80 mg x 1 was given.  Hospitalist was asked to admit patient for further evaluation and management.  Review of Systems: Constitutional: Negative for chills and fever.  HENT: Negative for ear pain and sore throat.   Eyes: Negative for pain and visual disturbance.  Respiratory: Positive for shortness of breath.  Negative for cough, chest tightness   Cardiovascular: Negative for chest pain and palpitations.  Gastrointestinal: Negative for abdominal pain and vomiting.  Endocrine: Negative for polyphagia and polyuria.  Genitourinary: Negative for decreased urine volume, dysuria, enuresis Musculoskeletal: Positive for leg swelling bilaterally.  Negative for arthralgias and back pain.  Skin: Negative for color change and rash.  Allergic/Immunologic: Negative for immunocompromised state.  Neurological: Negative for tremors, syncope, speech difficulty Hematological: Does not bruise/bleed easily.  All other systems reviewed and are negative  Past Medical History:  Diagnosis Date   Atrial fibrillation ([place])    CAD (coronary artery disease)    DES to LAD June 2018   CKD (chronic kidney disease) stage 3, GFR 30-59 ml/min (HCC)    Essential hypertension    History of pneumonia    Hyperlipidemia    Hypothyroidism    LBBB (left bundle branch block)    Morbid obesity ([place])    Non Hodgkin's lymphoma ([place])    Status post XRT and chemotherapy   NSTEMI (non-ST elevated myocardial infarction) [facility]    June 2018   Peripheral neuropathy    Pneumonia due to COVID-19 virus    Sleep apnea  Type 2 diabetes mellitus ([place])    Past Surgical History:  Procedure Laterality Date   CHOLECYSTECTOMY  1992   COLONOSCOPY N/A 09/03/2014   SLF:six colon polyps removed/small internal hemorrhoids   CORONARY STENT INTERVENTION N/A 01/21/2017   Procedure: Coronary Stent Intervention;  Surgeon: Nelva Bush, MD;   Location: [facility];  Service: Cardiovascular;  Laterality: N/A;   ESOPHAGOGASTRODUODENOSCOPY N/A 09/03/2014   SLF: mild gastritis/few gastric polyps   LEFT HEART CATH AND CORONARY ANGIOGRAPHY N/A 01/20/2017   Procedure: Left Heart Cath and Coronary Angiography;  Surgeon: Jettie Booze, MD;  Location: [facility];  Service: Cardiovascular;  Laterality: N/A;   TOTAL KNEE ARTHROPLASTY  11/10/2011   Procedure: TOTAL KNEE ARTHROPLASTY;  Surgeon: Mauri Pole, MD;  Location: [facility];  Service: Orthopedics;  Laterality: Right;   TOTAL KNEE ARTHROPLASTY Left 05/24/2018   Procedure: LEFT TOTAL KNEE ARTHROPLASTY;  Surgeon: Melrose Nakayama, MD;  Location: [facility];  Service: Orthopedics;  Laterality: Left;    Social History:  reports that he quit smoking about 15 months ago. His smoking use included cigarettes. He has a 7.50 pack-year smoking history. He has never used smokeless tobacco. He reports that he does not drink alcohol and does not use drugs.   No Known Allergies  Family History  Problem Relation Age of Onset   Cancer Mother        breast and lung   Cancer Father        bladder   Cancer Maternal Uncle        prostate   Cancer Paternal Uncle        esophagus   Colon cancer Neg Hx      Prior to Admission medications   Medication Sig Start Date End Date Taking? Authorizing Provider  acetaminophen (TYLENOL) 325 MG tablet Take 2 tablets (650 mg total) by mouth every 6 (six) hours as needed for mild pain (or Fever >/= 101). 02/01/20  Yes Emokpae, Courage, MD  albuterol (VENTOLIN HFA) 108 (90 Base) MCG/ACT inhaler Inhale 1-2 puffs into the lungs every 6 (six) hours as needed for wheezing or shortness of breath. 01/10/19  Yes Long, Wonda Olds, MD  amLODipine (NORVASC) 10 MG tablet Take 10 mg by mouth daily.   Yes [provider]  apixaban (ELIQUIS) 5 MG TABS tablet Take 1 tablet (5 mg total) by mouth 2 (two) times daily. This is a dose change 01/07/21  Yes Verta Ellen., NP  aspirin EC 81 MG tablet Take 81 mg by mouth daily. Swallow whole.   Yes [provider]  bisoprolol (ZEBETA) 5 MG tablet Take 5 mg by mouth daily. 11/26/20  Yes [provider]  chlorhexidine (PERIDEX) 0.12 % solution 15 mLs by Mouth Rinse [name] 2 (two) times daily. 12/31/20  Yes Shahmehdi, Seyed A, MD  furosemide (LASIX) 80 MG tablet Take 0.5 tablets (40 mg total) by mouth daily. 01/01/21  Yes Shahmehdi, Seyed A, MD  insulin degludec (TRESIBA) 100 UNIT/ML FlexTouch Pen Inject 56 Units into the skin daily at 10 pm.   Yes [provider]  ipratropium-albuterol (DUONEB) 0.5-2.5 (3) MG/3ML SOLN Inhale 3 mLs into the lungs every 4 (four) hours as needed (shortness of breath). 05/22/20  Yes [provider]  isosorbide mononitrate (IMDUR) 30 MG 24 hr tablet Take 1 tablet (30 mg total) by mouth daily. 12/04/20  Yes Satira Sark, MD  levocetirizine (XYZAL) 5 MG tablet Take 5 mg by mouth every evening.  08/27/17  Yes [provider]  levothyroxine (SYNTHROID) 100 MCG tablet Take 100 mcg by mouth daily before breakfast. (Takes with 200 mcg tab for a total of 300 mcg once daily)   Yes [provider]  levothyroxine (SYNTHROID, LEVOTHROID) 200 MCG tablet Take 300 mcg by mouth daily before breakfast. (takes with 134mcg tab for a total of 351mcg)   Yes [provider]  Mouthwashes (MOUTH RINSE) LIQD solution 15 mLs by Mouth Rinse [name] 2 times daily at 12 noon and 4 pm. 12/31/20 01/30/21 Yes Shahmehdi, Seyed A, MD  nitroGLYCERIN (NITROSTAT) 0.4 MG SL tablet Place 0.4 mg under the tongue every 5 (five) minutes as needed for chest pain.  12/27/17  Yes [provider]  OXYGEN Inhale 3-4 L into the lungs daily as needed (low oxygen).   Yes [provider]  potassium chloride SA (KLOR-CON) 20 MEQ tablet Take 1 tablet (20 mEq total) by mouth 2 (two) times daily. 08/10/20  Yes Johnson, Clanford L, MD  senna-docusate (SENOKOT-S) 8.6-50 MG  tablet Take 1 tablet by mouth daily as needed for mild constipation. 02/01/20  Yes [provider]  simvastatin (ZOCOR) 5 MG tablet Take 5 mg by mouth daily. 09/14/16  Yes [provider]  SYMBICORT 160-4.5 MCG/ACT inhaler Inhale 2 puffs into the lungs daily as needed (shortness of breath). 12/22/19  Yes [provider]  fluticasone (FLONASE) 50 MCG/ACT nasal spray Place 2 sprays into both nostrils as needed for up to 14 days. Patient not taking: Reported on 01/19/2021 08/10/20 08/24/20  Irwin Brakeman L, MD  insulin aspart (NOVOLOG) 100 UNIT/ML injection Inject 0-15 Units into the skin 3 (three) times daily with meals. Patient not taking: No sig reported 12/31/20 01/30/21  Deatra James, MD  methylPREDNISolone (MEDROL DOSEPAK) 4 MG TBPK tablet Medrol Dosepak take as instructed Patient not taking: No sig reported 12/31/20   Deatra James, MD  nystatin (MYCOSTATIN/NYSTOP) powder Apply topically 2 (two) times daily. Patient not taking: No sig reported 10/11/20   Heath Lark D, DO    Physical Exam: BP 125/66   Pulse 95   Temp 97.9 F (36.6 C) (Axillary)   Resp (!) 22   Ht 6\' 1"  (1.854 m)   Wt (!) 142.9 kg   SpO2 95%   BMI 41.56 kg/m   General: 75 y.o. year-old male obese patient in no acute distress.  Alert and oriented x3. HEENT: NCAT, EOMI Neck: Supple, trachea medial Cardiovascular: Irregular rate and rhythm with no rubs or gallops.  No thyromegaly or JVD noted.  No lower extremity edema. 2/4 pulses in all 4 extremities. Respiratory: Decreased breath sounds with bilateral rales in lower lobes.  No wheezes Abdomen: Soft, nontender nondistended with normal bowel sounds x4 quadrants. Muskuloskeletal: No cyanosis, clubbing or edema noted bilaterally Neuro: CN II-XII intact, strength 5/5 x 4, sensation, reflexes intact Skin: No ulcerative lesions noted or rashes Psychiatry: Judgement and insight appear normal. Mood is appropriate for condition and setting           Labs on Admission:  Basic Metabolic Panel: Recent Labs  Lab 01/14/21 1648 01/19/21 1930  NA 142 142  K 3.9 3.5  CL 100 99  CO2 33* 33*  GLUCOSE 89 118*  BUN 34* 41*  CREATININE 2.42* 2.44*  CALCIUM 8.8* 8.7*   Liver Function Tests: Recent Labs  Lab 01/14/21 1648 01/19/21 1930  AST 14* 11*  ALT 15 11  ALKPHOS 107 106  BILITOT 0.9 0.6  PROT 7.1  7.3  ALBUMIN 3.7 3.9   No results for input(s): LIPASE, AMYLASE in the last 168 hours. No results for input(s): AMMONIA in the last 168 hours. CBC: Recent Labs  Lab 01/14/21 1648 01/19/21 1930  WBC 6.6 6.5  NEUTROABS 5.1  --   HGB 14.0 14.1  HCT 46.3 47.3  MCV 93.5 94.4  PLT 117* 126*   Cardiac Enzymes: No results for input(s): CKTOTAL, CKMB, CKMBINDEX, TROPONINI in the last 168 hours.  BNP (last 3 results) Recent Labs    01/02/21 0556 01/14/21 1648 01/19/21 1930  BNP 171.0* 163.0* 163.0*    ProBNP (last 3 results) No results for input(s): PROBNP in the last 8760 hours.  CBG: No results for input(s): GLUCAP in the last 168 hours.  Radiological Exams on Admission: DG Chest Portable 1 View  Result Date: 01/19/2021 CLINICAL DATA:  Shortness of breath EXAM: PORTABLE CHEST 1 VIEW COMPARISON:  01/14/2021 FINDINGS: Stable cardiomegaly. Low lung volumes. Pulmonary vascular congestion with diffuse interstitial prominence. Small bilateral pleural effusions. No pneumothorax. IMPRESSION: Cardiomegaly with pulmonary edema and small bilateral pleural effusions. Overall, findings are similar to the prior exam. Electronically Signed   By: Davina Poke D.O.   On: 01/19/2021 20:07    EKG: I independently viewed the EKG done and my findings are as followed: Atrial fibrillation with rate control and with PVCs  Assessment/Plan Present on Admission:  Acute on chronic diastolic CHF (congestive heart failure) (HCC)  Lactic acidosis  Hypertension  Hypothyroidism  Chronic kidney disease Stage IV  Active Problems:    Chronic kidney disease Stage IV   Hypertension   Elevated troponin   Lactic acidosis   Hypothyroidism   Acute on chronic respiratory failure with hypoxia and hypercapnia (HCC)   Acute on chronic diastolic CHF (congestive heart failure) (HCC)   Pleural effusion   Thrombocytopenia (HCC)   Elevated d-dimer   Atrial fibrillation, chronic (HCC)  Acute on chronic respiratory failure with hypoxia hypercapnia possibly secondary to acute on chronic diastolic CHF Bilateral pleural effusion Chest x-ray showed cardiomegaly with pulmonary edema and small bilateral pleural effusions with findings similar to prior exam. Continue total input/output, daily weights and fluid restriction Continue IV Lasix 40 twice daily Continue bisoprolol, statin, aspirin Continue Cardiac diet  Echocardiogram done on 12/28/2020 showed LVEF of 55 to 60%.  LV as no RWMA.  Lactic acidosis possibly due to hypoxia- resolved Lactic acid 2.0 > 1.5  Thrombocytopenia (chronic) Platelets 126; continue to monitor platelet level  Elevated troponin possible secondary to type II demand ischemia Troponin 41 > 42  Elevated D-dimer r/o PE D-dimer 2.44; CT angiography of chest was not done due to patient's elevated creatinine Patient is already on Eliquis and chances of PE is reduced, but VQ scan was ordered in the ED to ensure no PE while on anticoagulant.  Chronic atrial fibrillation CHA2DS2- VASc score   is = 3    Which is  equal to = 3.2 % annual risk of stroke  Continue Eliquis, bisoprolol  Hypertension Continue bisoprolol and IV Lasix  Hypothyroidism Continue Synthroid  CKD stage IV BUN/creatinine 41/2.44 (creatinine within baseline range Renally adjust medications, avoid nephrotoxic agents/dehydration/hypotension  T2DM Last A1c (3 months ago) was 5.3 Continue diet modification  OSA Continue CPAP at night  ?COPD Continue albuterol, DuoNeb and Dulera  DVT prophylaxis: Eliquis  Code Status: Full  code  Family Communication: None at bedside  Disposition Plan:  Patient is from:  home Anticipated DC to:                   SNF or family members home Anticipated DC date:               2-3 days Anticipated DC barriers:          Patient requires inpatient management due to acute exacerbation of CHF    Consults called: None  Admission status: Inpatient    Bernadette Hoit MD [facility]  01/20/2021, 5:18 AM

## 2021-01-19 NOTE — ED Notes (Signed)
Date and time results received: 01/19/21 8:07 PM (use smartphrase ".now" to insert current time)  Test: lactic Critical Value: 2.0  Name of Provider Notified: Eulis Foster  Orders Received? Or Actions Taken?: acknowledged

## 2021-01-20 ENCOUNTER — Inpatient Hospital Stay (HOSPITAL_COMMUNITY): Payer: Medicare HMO

## 2021-01-20 ENCOUNTER — Encounter (HOSPITAL_COMMUNITY): Payer: Self-pay | Admitting: Internal Medicine

## 2021-01-20 DIAGNOSIS — Z515 Encounter for palliative care: Secondary | ICD-10-CM

## 2021-01-20 DIAGNOSIS — D696 Thrombocytopenia, unspecified: Secondary | ICD-10-CM | POA: Diagnosis present

## 2021-01-20 DIAGNOSIS — R778 Other specified abnormalities of plasma proteins: Secondary | ICD-10-CM

## 2021-01-20 DIAGNOSIS — N184 Chronic kidney disease, stage 4 (severe): Secondary | ICD-10-CM | POA: Diagnosis present

## 2021-01-20 DIAGNOSIS — I130 Hypertensive heart and chronic kidney disease with heart failure and stage 1 through stage 4 chronic kidney disease, or unspecified chronic kidney disease: Secondary | ICD-10-CM | POA: Diagnosis present

## 2021-01-20 DIAGNOSIS — Z8616 Personal history of COVID-19: Secondary | ICD-10-CM | POA: Diagnosis not present

## 2021-01-20 DIAGNOSIS — I509 Heart failure, unspecified: Secondary | ICD-10-CM | POA: Diagnosis not present

## 2021-01-20 DIAGNOSIS — G4733 Obstructive sleep apnea (adult) (pediatric): Secondary | ICD-10-CM | POA: Diagnosis present

## 2021-01-20 DIAGNOSIS — I482 Chronic atrial fibrillation, unspecified: Secondary | ICD-10-CM

## 2021-01-20 DIAGNOSIS — J9622 Acute and chronic respiratory failure with hypercapnia: Secondary | ICD-10-CM

## 2021-01-20 DIAGNOSIS — J9621 Acute and chronic respiratory failure with hypoxia: Secondary | ICD-10-CM | POA: Diagnosis present

## 2021-01-20 DIAGNOSIS — Z7189 Other specified counseling: Secondary | ICD-10-CM

## 2021-01-20 DIAGNOSIS — Z923 Personal history of irradiation: Secondary | ICD-10-CM | POA: Diagnosis not present

## 2021-01-20 DIAGNOSIS — R7989 Other specified abnormal findings of blood chemistry: Secondary | ICD-10-CM | POA: Diagnosis present

## 2021-01-20 DIAGNOSIS — E1142 Type 2 diabetes mellitus with diabetic polyneuropathy: Secondary | ICD-10-CM | POA: Diagnosis present

## 2021-01-20 DIAGNOSIS — Z6841 Body Mass Index (BMI) 40.0 and over, adult: Secondary | ICD-10-CM | POA: Diagnosis not present

## 2021-01-20 DIAGNOSIS — M7989 Other specified soft tissue disorders: Secondary | ICD-10-CM | POA: Diagnosis present

## 2021-01-20 DIAGNOSIS — E039 Hypothyroidism, unspecified: Secondary | ICD-10-CM | POA: Diagnosis present

## 2021-01-20 DIAGNOSIS — I2510 Atherosclerotic heart disease of native coronary artery without angina pectoris: Secondary | ICD-10-CM | POA: Diagnosis present

## 2021-01-20 DIAGNOSIS — J90 Pleural effusion, not elsewhere classified: Secondary | ICD-10-CM

## 2021-01-20 DIAGNOSIS — J441 Chronic obstructive pulmonary disease with (acute) exacerbation: Secondary | ICD-10-CM | POA: Diagnosis present

## 2021-01-20 DIAGNOSIS — E872 Acidosis: Secondary | ICD-10-CM | POA: Diagnosis present

## 2021-01-20 DIAGNOSIS — Z8572 Personal history of non-Hodgkin lymphomas: Secondary | ICD-10-CM | POA: Diagnosis not present

## 2021-01-20 DIAGNOSIS — E1122 Type 2 diabetes mellitus with diabetic chronic kidney disease: Secondary | ICD-10-CM | POA: Diagnosis present

## 2021-01-20 DIAGNOSIS — E785 Hyperlipidemia, unspecified: Secondary | ICD-10-CM | POA: Diagnosis present

## 2021-01-20 DIAGNOSIS — Z9221 Personal history of antineoplastic chemotherapy: Secondary | ICD-10-CM | POA: Diagnosis not present

## 2021-01-20 DIAGNOSIS — I5033 Acute on chronic diastolic (congestive) heart failure: Secondary | ICD-10-CM | POA: Diagnosis present

## 2021-01-20 DIAGNOSIS — R0602 Shortness of breath: Secondary | ICD-10-CM | POA: Diagnosis present

## 2021-01-20 DIAGNOSIS — Z20822 Contact with and (suspected) exposure to covid-19: Secondary | ICD-10-CM | POA: Diagnosis present

## 2021-01-20 LAB — CBC
HCT: 47.4 % (ref 39.0–52.0)
Hemoglobin: 14 g/dL (ref 13.0–17.0)
MCH: 27.9 pg (ref 26.0–34.0)
MCHC: 29.5 g/dL — ABNORMAL LOW (ref 30.0–36.0)
MCV: 94.6 fL (ref 80.0–100.0)
Platelets: 121 10*3/uL — ABNORMAL LOW (ref 150–400)
RBC: 5.01 MIL/uL (ref 4.22–5.81)
RDW: 16.5 % — ABNORMAL HIGH (ref 11.5–15.5)
WBC: 6.2 10*3/uL (ref 4.0–10.5)
nRBC: 0 % (ref 0.0–0.2)

## 2021-01-20 LAB — COMPREHENSIVE METABOLIC PANEL
ALT: 11 U/L (ref 0–44)
AST: 12 U/L — ABNORMAL LOW (ref 15–41)
Albumin: 3.6 g/dL (ref 3.5–5.0)
Alkaline Phosphatase: 89 U/L (ref 38–126)
Anion gap: 10 (ref 5–15)
BUN: 38 mg/dL — ABNORMAL HIGH (ref 8–23)
CO2: 32 mmol/L (ref 22–32)
Calcium: 8.7 mg/dL — ABNORMAL LOW (ref 8.9–10.3)
Chloride: 101 mmol/L (ref 98–111)
Creatinine, Ser: 2.28 mg/dL — ABNORMAL HIGH (ref 0.61–1.24)
GFR, Estimated: 30 mL/min — ABNORMAL LOW (ref >60)
Glucose, Bld: 76 mg/dL (ref 70–99)
Potassium: 3.5 mmol/L (ref 3.5–5.1)
Sodium: 143 mmol/L (ref 135–145)
Total Bilirubin: 0.8 mg/dL (ref 0.3–1.2)
Total Protein: 6.8 g/dL (ref 6.5–8.1)

## 2021-01-20 LAB — PHOSPHORUS: Phosphorus: 4.1 mg/dL (ref 2.5–4.6)

## 2021-01-20 LAB — MRSA NEXT GEN BY PCR, NASAL: MRSA by PCR Next Gen: NOT DETECTED

## 2021-01-20 LAB — PROTIME-INR
INR: 1.3 — ABNORMAL HIGH (ref 0.8–1.2)
Prothrombin Time: 16 seconds — ABNORMAL HIGH (ref 11.4–15.2)

## 2021-01-20 LAB — MAGNESIUM: Magnesium: 2.4 mg/dL (ref 1.7–2.4)

## 2021-01-20 LAB — APTT: aPTT: 35 seconds (ref 24–36)

## 2021-01-20 MED ORDER — APIXABAN 5 MG PO TABS
5.0000 mg | ORAL_TABLET | Freq: Two times a day (BID) | ORAL | Status: DC
  Administered 2021-01-20 – 2021-01-24 (×9): 5 mg via ORAL
  Filled 2021-01-20 (×9): qty 1

## 2021-01-20 MED ORDER — TECHNETIUM TO 99M ALBUMIN AGGREGATED
4.0000 | Freq: Once | INTRAVENOUS | Status: AC | PRN
  Administered 2021-01-20: 4 via INTRAVENOUS

## 2021-01-20 MED ORDER — IPRATROPIUM-ALBUTEROL 0.5-2.5 (3) MG/3ML IN SOLN
3.0000 mL | RESPIRATORY_TRACT | Status: DC | PRN
  Administered 2021-01-20 – 2021-01-21 (×2): 3 mL via RESPIRATORY_TRACT
  Filled 2021-01-20 (×2): qty 3

## 2021-01-20 MED ORDER — LEVOTHYROXINE SODIUM 100 MCG PO TABS
300.0000 ug | ORAL_TABLET | Freq: Every day | ORAL | Status: DC
  Administered 2021-01-21 – 2021-01-24 (×4): 300 ug via ORAL
  Filled 2021-01-20 (×4): qty 3

## 2021-01-20 MED ORDER — MOMETASONE FURO-FORMOTEROL FUM 200-5 MCG/ACT IN AERO: 2.0000 | INHALATION_SPRAY | Freq: Two times a day (BID) | RESPIRATORY_TRACT | Status: DC

## 2021-01-20 MED ORDER — FUROSEMIDE 10 MG/ML IJ SOLN
40.0000 mg | Freq: Two times a day (BID) | INTRAMUSCULAR | Status: DC
  Administered 2021-01-20: 40 mg via INTRAVENOUS
  Filled 2021-01-20: qty 4

## 2021-01-20 MED ORDER — IPRATROPIUM-ALBUTEROL 0.5-2.5 (3) MG/3ML IN SOLN
3.0000 mL | Freq: Four times a day (QID) | RESPIRATORY_TRACT | Status: DC
  Administered 2021-01-20 – 2021-01-24 (×17): 3 mL via RESPIRATORY_TRACT
  Filled 2021-01-20 (×17): qty 3

## 2021-01-20 MED ORDER — ENOXAPARIN SODIUM 40 MG/0.4ML IJ SOSY
40.0000 mg | PREFILLED_SYRINGE | INTRAMUSCULAR | Status: DC
  Administered 2021-01-20: 40 mg via SUBCUTANEOUS
  Filled 2021-01-20: qty 0.4

## 2021-01-20 MED ORDER — ISOSORBIDE MONONITRATE ER 30 MG PO TB24
30.0000 mg | ORAL_TABLET | Freq: Every day | ORAL | Status: DC
  Administered 2021-01-20 – 2021-01-24 (×5): 30 mg via ORAL
  Filled 2021-01-20 (×5): qty 1

## 2021-01-20 MED ORDER — POTASSIUM CHLORIDE CRYS ER 20 MEQ PO TBCR
20.0000 meq | EXTENDED_RELEASE_TABLET | Freq: Two times a day (BID) | ORAL | Status: DC
  Administered 2021-01-20 (×2): 20 meq via ORAL
  Filled 2021-01-20 (×2): qty 1

## 2021-01-20 MED ORDER — FUROSEMIDE 10 MG/ML IJ SOLN
40.0000 mg | Freq: Once | INTRAMUSCULAR | Status: AC
  Administered 2021-01-20: 40 mg via INTRAVENOUS
  Filled 2021-01-20: qty 4

## 2021-01-20 MED ORDER — BISOPROLOL FUMARATE 5 MG PO TABS
5.0000 mg | ORAL_TABLET | Freq: Every day | ORAL | Status: DC
  Administered 2021-01-20 – 2021-01-24 (×5): 5 mg via ORAL
  Filled 2021-01-20 (×5): qty 1

## 2021-01-20 MED ORDER — SIMVASTATIN 10 MG PO TABS
5.0000 mg | ORAL_TABLET | Freq: Every day | ORAL | Status: DC
  Administered 2021-01-20 – 2021-01-24 (×5): 5 mg via ORAL
  Filled 2021-01-20 (×5): qty 1

## 2021-01-20 MED ORDER — ACETAMINOPHEN 325 MG PO TABS: 650.0000 mg | ORAL_TABLET | Freq: Four times a day (QID) | ORAL | Status: DC | PRN

## 2021-01-20 MED ORDER — METHYLPREDNISOLONE SODIUM SUCC 125 MG IJ SOLR
80.0000 mg | Freq: Two times a day (BID) | INTRAMUSCULAR | Status: DC
  Administered 2021-01-20 – 2021-01-23 (×7): 80 mg via INTRAVENOUS
  Filled 2021-01-20 (×7): qty 2

## 2021-01-20 MED ORDER — BUDESONIDE 0.5 MG/2ML IN SUSP
0.5000 mg | Freq: Two times a day (BID) | RESPIRATORY_TRACT | Status: DC
  Administered 2021-01-20 – 2021-01-24 (×9): 0.5 mg via RESPIRATORY_TRACT
  Filled 2021-01-20 (×10): qty 2

## 2021-01-20 MED ORDER — ORAL CARE MOUTH RINSE
15.0000 mL | Freq: Two times a day (BID) | OROMUCOSAL | Status: DC
  Administered 2021-01-20: 15 mL via OROMUCOSAL

## 2021-01-20 MED ORDER — ONDANSETRON HCL 4 MG/2ML IJ SOLN
4.0000 mg | Freq: Four times a day (QID) | INTRAMUSCULAR | Status: DC | PRN
Start: 2021-01-20 — End: 2021-01-24

## 2021-01-20 MED ORDER — ASPIRIN EC 81 MG PO TBEC
81.0000 mg | DELAYED_RELEASE_TABLET | Freq: Every day | ORAL | Status: DC
  Administered 2021-01-20 – 2021-01-24 (×5): 81 mg via ORAL
  Filled 2021-01-20 (×5): qty 1

## 2021-01-20 MED ORDER — ALBUTEROL SULFATE HFA 108 (90 BASE) MCG/ACT IN AERS: 1.0000 | INHALATION_SPRAY | Freq: Four times a day (QID) | RESPIRATORY_TRACT | Status: DC | PRN

## 2021-01-20 MED ORDER — CHLORHEXIDINE GLUCONATE CLOTH 2 % EX PADS
6.0000 | MEDICATED_PAD | Freq: Every day | CUTANEOUS | Status: DC
  Administered 2021-01-20 – 2021-01-24 (×5): 6 via TOPICAL

## 2021-01-20 MED ORDER — FUROSEMIDE 10 MG/ML IJ SOLN
80.0000 mg | Freq: Two times a day (BID) | INTRAMUSCULAR | Status: DC
  Administered 2021-01-20 – 2021-01-22 (×4): 80 mg via INTRAVENOUS
  Filled 2021-01-20 (×5): qty 8

## 2021-01-20 MED ORDER — CHLORHEXIDINE GLUCONATE 0.12 % MT SOLN
15.0000 mL | Freq: Two times a day (BID) | OROMUCOSAL | Status: DC
  Administered 2021-01-20 – 2021-01-23 (×6): 15 mL via OROMUCOSAL
  Filled 2021-01-20 (×6): qty 15

## 2021-01-20 NOTE — Plan of Care (Addendum)
  Problem: Acute Rehab PT Goals(only PT should resolve) Goal: Pt Will Go Supine/Side To Sit Outcome: Progressing Flowsheets (Taken 01/20/2021 1547) Pt will go Supine/Side to Sit: Independently Goal: Pt Will Go Sit To Supine/Side Outcome: Progressing Flowsheets (Taken 01/20/2021 1547) Pt will go Sit to Supine/Side: Independently Goal: Patient Will Transfer Sit To/From Stand Outcome: Progressing Flowsheets (Taken 01/20/2021 1547) Patient will transfer sit to/from stand: with modified independence Goal: Pt Will Transfer Bed To Chair/Chair To Bed Outcome: Progressing Flowsheets (Taken 01/20/2021 1547) Pt will Transfer Bed to Chair/Chair to Bed: with modified independence Goal: Pt Will Ambulate Outcome: Progressing Flowsheets (Taken 01/20/2021 1547) Pt will Ambulate:  50 feet  with modified independence  with rolling walker   3:48 PM, 01/20/21 Jeneen Rinks Cousler SPT  4:03 PM, 01/20/21 [name], MPT Physical Therapist with [facility] [phone] office [phone] mobile phone

## 2021-01-20 NOTE — Plan of Care (Signed)

## 2021-01-20 NOTE — Consult Note (Addendum)
Consultation Note Date: 01/20/2021   Patient Name: [name] [name]  DOB: [birth year]  MRN: [id number]  Age / Sex: 75 y.o., male  PCP: Nickola Major, MD Referring Physician: Orson Eva, MD  Reason for Consultation: Establishing goals of care  HPI/Patient Profile: 76 y.o. male  with past medical history of CAD s/p PCI 2018, NSTEMI, HFpEF LBBB, IV CKD4, HTN/HLD, recent COVID pneumonia, hypothyroid, class III obesity, history of non-Hodgkin's lymphoma with chemo and radiotherapy, sleep apnea, peripheral neuropathy, DM2 admitted on 01/19/2021 with acute on chronic respiratory failure and acute on chronic heart failure.   Clinical Assessment and Goals of Care: I have reviewed medical records including EPIC notes, labs and imaging, received report from RN, assessed the patient and then met at the bedside with [name] [name] to discuss diagnosis prognosis, [place], EOL wishes, disposition and options.  I introduced Palliative Medicine as specialized medical care for people living with serious illness. It focuses on providing relief from the symptoms and stress of a serious illness. The goal is to improve quality of life for both the patient and the family.  [name] [name] is sitting on the edge of the bed with PT at bedside.  He appears acutely/chronically ill, morbidly obese.  He will briefly make but not keep eye contact.  He is alert and oriented, able to make his needs known.  There is no family at bedside at this time.  [name] [name] shares that his daughter, Lexine Baton, lives in his home.  We talked about his acute health concerns with respiratory failure and heart failure exacerbation.  We talked about 4 hospital stays and 2 ED visits in 6 months.  We plan to have a more detailed discussion about his acute and chronic health concerns tomorrow.  Advanced directives, concepts specific to code status, artifical feeding and hydration, and  rehospitalization were considered and discussed.  [name] [name] tells me that he would not want life support if it will not change his health.  We briefly discussed "treat the treatable" and I share that if we are doing everything to help him improve, and yet he worsens and needs life support, he would still have all of his chronic health issues plus more.  We talked about physical therapy and rehab.  [name] [name] was recently at [facility], and states that he would not return there.  He states that he is agreeable to short-term rehab if qualified, he has been to [facility] and to [facility] in the past and would consider going there.  Discussed the importance of continued conversation with family and the medical providers regarding overall plan of care and treatment options, ensuring decisions are within the context of the patient's values and GOCs.  Questions and concerns were addressed.  The patient was encouraged to call with questions or concerns.  PMT will continue to support holistically.  Conference with attending, bedside nursing staff, transition of care team related to patient condition, needs, goals of care, disposition.    HCPOA   NEXT OF  KIN -Mr. Coad names his daughter, Lexine Baton, as his healthcare surrogate.  Nursing staff shares that he lost his son and his mother in February of this year.    SUMMARY OF RECOMMENDATIONS   At this point continue to treat the treatable PMT to continue goals of care/CODE STATUS discussions Anticipate will qualify for short-term rehab Unsure if he will except short-term rehab   Code Status/Advance Care Planning: Full code  Symptom Management:  Per hospitalist, no additional needs at this time.  Palliative Prophylaxis:  Frequent Pain Assessment and Turn Reposition  Additional Recommendations (Limitations, Scope, Preferences): Full Scope Treatment  Psycho-social/Spiritual:  Desire for further Chaplaincy support:no Additional  Recommendations: Caregiving  Support/Resources and Education on Hospice  Prognosis:  Unable to determine, based on outcomes.  6 months or less would not be surprising based on chronic illness burden, 4 hospital admissions and 2 ED visits in the last 6 months.  Discharge Planning:  To be determined, based on outcomes.  Anticipate home with home health, anticipate decline for short-term care       Primary Diagnoses: Present on Admission:  Acute on chronic diastolic CHF (congestive heart failure) (HCC)  Lactic acidosis  Hypertension  Hypothyroidism  Chronic kidney disease Stage IV  Type 2 diabetes mellitus with stage 4 chronic kidney disease ([place])  Morbid obesity ([place])   I have reviewed the medical record, interviewed the patient and family, and examined the patient. The following aspects are pertinent.  Past Medical History:  Diagnosis Date   Atrial fibrillation ([place])    CAD (coronary artery disease)    DES to LAD June 2018   CKD (chronic kidney disease) stage 3, GFR 30-59 ml/min (HCC)    Essential hypertension    History of pneumonia    Hyperlipidemia    Hypothyroidism    LBBB (left bundle branch block)    Morbid obesity (HCC)    Non Hodgkin's lymphoma ([place])    Status post XRT and chemotherapy   NSTEMI (non-ST elevated myocardial infarction) [facility])    June 2018   Peripheral neuropathy    Pneumonia due to COVID-19 virus    Sleep apnea    Type 2 diabetes mellitus ([place])    Social History   Socioeconomic History   Marital status: Divorced    Spouse name: Not on file   Number of children: Not on file   Years of education: Not on file   Highest education level: Not on file  Occupational History   Not on file  Tobacco Use   Smoking status: Former    Packs/day: 0.25    Years: 30.00    Pack years: 7.50    Types: Cigarettes    Quit date: 10/2019    Years since quitting: 1.3   Smokeless tobacco: Never  Vaping Use   Vaping Use: Never used  Substance and Sexual  Activity   Alcohol use: No    Alcohol/week: 0.0 standard drinks   Drug use: No   Sexual activity: Yes  Other Topics Concern   Not on file  Social History Narrative   Not on file   Social Determinants of Health   Financial Resource Strain: Not on file  Food Insecurity: Not on file  Transportation Needs: Not on file  Physical Activity: Not on file  Stress: Not on file  Social Connections: Not on file   Family History  Problem Relation Age of Onset   Cancer Mother        breast and lung  Cancer Father        bladder   Cancer Maternal Uncle        prostate   Cancer Paternal Uncle        esophagus   Colon cancer Neg Hx    Scheduled Meds:  apixaban  5 mg Oral BID   aspirin EC  81 mg Oral Daily   bisoprolol  5 mg Oral Daily   budesonide (PULMICORT) nebulizer solution  0.5 mg Nebulization BID   chlorhexidine  15 mL Mouth Rinse BID   Chlorhexidine Gluconate Cloth  6 each Topical Daily   furosemide  80 mg Intravenous Q12H   ipratropium-albuterol  3 mL Nebulization Q6H   isosorbide mononitrate  30 mg Oral Daily   levothyroxine  300 mcg Oral QAC breakfast   mouth rinse  15 mL Mouth Rinse q12n4p   methylPREDNISolone (SOLU-MEDROL) injection  80 mg Intravenous Q12H   potassium chloride SA  20 mEq Oral BID   simvastatin  5 mg Oral Daily   Continuous Infusions: PRN Meds:.acetaminophen, albuterol, ipratropium-albuterol, ondansetron (ZOFRAN) IV Medications Prior to Admission:  Prior to Admission medications   Medication Sig Start Date End Date Taking? Authorizing Provider  acetaminophen (TYLENOL) 325 MG tablet Take 2 tablets (650 mg total) by mouth every 6 (six) hours as needed for mild pain (or Fever >/= 101). 02/01/20  Yes Emokpae, Courage, MD  albuterol (VENTOLIN HFA) 108 (90 Base) MCG/ACT inhaler Inhale 1-2 puffs into the lungs every 6 (six) hours as needed for wheezing or shortness of breath. 01/10/19  Yes Long, Wonda Olds, MD  amLODipine (NORVASC) 10 MG tablet Take 10 mg by mouth  daily.   Yes [provider]  apixaban (ELIQUIS) 5 MG TABS tablet Take 1 tablet (5 mg total) by mouth 2 (two) times daily. This is a dose change 01/07/21  Yes Verta Ellen., NP  aspirin EC 81 MG tablet Take 81 mg by mouth daily. Swallow whole.   Yes [provider]  bisoprolol (ZEBETA) 5 MG tablet Take 5 mg by mouth daily. 11/26/20  Yes [provider]  chlorhexidine (PERIDEX) 0.12 % solution 15 mLs by Mouth Rinse route 2 (two) times daily. 12/31/20  Yes Shahmehdi, Seyed A, MD  furosemide (LASIX) 80 MG tablet Take 0.5 tablets (40 mg total) by mouth daily. 01/01/21  Yes Shahmehdi, Seyed A, MD  insulin degludec (TRESIBA) 100 UNIT/ML FlexTouch Pen Inject 56 Units into the skin daily at 10 pm.   Yes [provider]  ipratropium-albuterol (DUONEB) 0.5-2.5 (3) MG/3ML SOLN Inhale 3 mLs into the lungs every 4 (four) hours as needed (shortness of breath). 05/22/20  Yes [provider]  isosorbide mononitrate (IMDUR) 30 MG 24 hr tablet Take 1 tablet (30 mg total) by mouth daily. 12/04/20  Yes Satira Sark, MD  levocetirizine (XYZAL) 5 MG tablet Take 5 mg by mouth every evening.  08/27/17  Yes [provider]  levothyroxine (SYNTHROID) 100 MCG tablet Take 100 mcg by mouth daily before breakfast. (Takes with 200 mcg tab for a total of 300 mcg once daily)   Yes [provider]  levothyroxine (SYNTHROID, LEVOTHROID) 200 MCG tablet Take 300 mcg by mouth daily before breakfast. (takes with 178mg tab for a total of 3063m)   Yes [provider]  Mouthwashes (MOUTH RINSE) LIQD solution 15 mLs by Mouth Rinse route 2 times daily at 12 noon and 4 pm. 12/31/20 01/30/21 Yes Shahmehdi, Seyed A, MD  nitroGLYCERIN (NITROSTAT) 0.4 MG SL tablet  Place 0.4 mg under the tongue every 5 (five) minutes as needed for chest pain.  12/27/17  Yes [provider]  OXYGEN Inhale 3-4 L into the lungs daily as needed (low oxygen).   Yes [provider]   potassium chloride SA (KLOR-CON) 20 MEQ tablet Take 1 tablet (20 mEq total) by mouth 2 (two) times daily. 08/10/20  Yes Johnson, Clanford L, MD  senna-docusate (SENOKOT-S) 8.6-50 MG tablet Take 1 tablet by mouth daily as needed for mild constipation. 02/01/20  Yes [provider]  simvastatin (ZOCOR) 5 MG tablet Take 5 mg by mouth daily. 09/14/16  Yes [provider]  SYMBICORT 160-4.5 MCG/ACT inhaler Inhale 2 puffs into the lungs daily as needed (shortness of breath). 12/22/19  Yes [provider]  fluticasone (FLONASE) 50 MCG/ACT nasal spray Place 2 sprays into both nostrils as needed for up to 14 days. Patient not taking: Reported on 01/19/2021 08/10/20 08/24/20  Irwin Brakeman L, MD  insulin aspart (NOVOLOG) 100 UNIT/ML injection Inject 0-15 Units into the skin 3 (three) times daily with meals. Patient not taking: No sig reported 12/31/20 01/30/21  Deatra James, MD  methylPREDNISolone (MEDROL DOSEPAK) 4 MG TBPK tablet Medrol Dosepak take as instructed Patient not taking: No sig reported 12/31/20   Deatra James, MD  nystatin (MYCOSTATIN/NYSTOP) powder Apply topically 2 (two) times daily. Patient not taking: No sig reported 10/11/20   Heath Lark D, DO   No Known Allergies Review of Systems  Unable to perform ROS: Other   Physical Exam Vitals and nursing note reviewed.  Constitutional:      Appearance: He is obese.  Cardiovascular:     Rate and Rhythm: Normal rate.  Pulmonary:     Effort: Pulmonary effort is normal.  Musculoskeletal:     Right lower leg: Edema present.  Skin:    General: Skin is warm and dry.  Neurological:     Mental Status: He is alert and oriented to person, place, and time.  Psychiatric:        Mood and Affect: Mood normal.        Behavior: Behavior normal.    Vital Signs: BP (!) 110/52   Pulse 79   Temp (!) 97.2 F (36.2 C) (Oral)   Resp (!) 22   Ht 6' 1"  (1.854 m)   Wt (!) 142.9 kg   SpO2 92%   BMI 41.56 kg/m  Pain  Scale: 0-10 POSS *See Group Information*: 1-Acceptable,Awake and alert Pain Score: 0-No pain   SpO2: SpO2: 92 % O2 Device:SpO2: 92 % O2 Flow Rate: .O2 Flow Rate (L/min): 6 L/min  IO: Intake/output summary:  Intake/Output Summary (Last 24 hours) at 01/20/2021 1452 Last data filed at 01/20/2021 0920 Gross per 24 hour  Intake 120 ml  Output 800 ml  Net -680 ml    LBM: Last BM Date: 01/20/21 Baseline Weight: Weight: (!) 142.9 kg Most recent weight: Weight: (!) 142.9 kg     Palliative Assessment/Data:   Flowsheet Rows    Flowsheet Row Most Recent Value  Intake Tab   Referral Department Hospitalist  Unit at Time of Referral Intermediate Care Unit  Palliative Care Primary Diagnosis Cardiac  Date Notified 01/20/21  Palliative Care Type New Palliative care  Reason for referral Clarify Goals of Care  Date of Admission 01/19/21  Date first seen by Palliative Care 01/20/21  # of days Palliative referral response time 0 Day(s)  # of days IP prior to Palliative referral 1  Clinical Assessment   Palliative Performance Scale Score 40%  Pain Max last 24 hours Not able to report  Pain Min Last 24 hours Not able to report  Dyspnea Max Last 24 Hours Not able to report  Dyspnea Min Last 24 hours Not able to report  Psychosocial & Spiritual Assessment   Palliative Care Outcomes        Time In: 1330 Time Out: 1420 Time Total: 50 minutes Greater than 50%  of this time was spent counseling and coordinating care related to the above assessment and plan.  Signed by: Drue Novel, NP   Please contact Palliative Medicine Team phone at [phone] for questions and concerns.  For individual provider: See Shea Evans

## 2021-01-20 NOTE — Progress Notes (Signed)
PROGRESS NOTE  ALY HAUSER YBO:[id number] DOB: Apr 23, 1946 DOA: 01/19/2021 PCP: Nickola Major, MD  Brief History:  75 y.o. 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  The patient has had numerous hospitalizations with similar presentations.  Most recently, the patient was admitted to the hospital from 12/28/2020 to 01/02/2021 for acute on chronic diastolic CHF.  He was discharged to a skilled nursing facility with furosemide 40 mg daily.  Prior to that hospitalization, the patient was admitted from 10/06/2020 to 10/11/2020 once again for respiratory failure secondary to CHF exacerbation.  His discharge weight at that time was 132.4 kg.  It appears he was discharged home with furosemide 80 mg once daily at that time.  Once again, he was admitted from 08/04/2020 to 08/10/2020 for respiratory failure secondary to acute on chronic diastolic CHF.  He was discharged home on furosemide 80 mg once daily during the hospitalization.  Notably, the patient was discharged to a skilled nursing facility from his most recent hospitalization, but he left only after a few days because "he did not like it".  He most recently visited the emergency department on 01/14/2021 with fluid overload symptoms.  He was told to increase his furosemide, although the patient does not remember being told this.  Nevertheless, it appears that the patient's furosemide dosing has been changed on numerous occasions based upon his fluctuating serum creatinine.  In addition, the patient endorses drinking "plenty of fluid".  He  states that he eats carryout food approximately 3 times per week.  In addition, he has not infrequently missed his furosemide dosing.  He states that he has been more short of breath since he left the skilled nursing facility, but his breathing has been worse for the past week.  He denies any fevers, chills, chest pain, nausea,, diarrhea, abd pain.  In the emergency department, the patient was afebrile hemodynamically stable with oxygen saturations 95%.  The patient was placed on BiPAP secondary to VBG showing hypercarbia.  He was admitted for further evaluation and treatment of his CHF and respiratory failure.  Assessment/Plan: Acute on chronic respiratory failure with hypoxia and hypercarbia -Secondary to pulmonary edema in the setting of OSA -Wean off BiPAP -Personally reviewed chest x-ray--increased interstitial markings, bilateral pleural effusions -Patient is chronically on 3 L at home but he frequently increases it as needed  Acute on chronic diastolic CHF -2/77/8242 echo EF 55 to 60%, no WMA, trivial MR/TR, moderate PASP--58.1 mmHg -Daily weights -Increase furosemide 80 mg IV twice daily -Patient endorses indiscretion with diet and some poor compliance with furosemide -In addition, the patient appears confused with his furosemide dosing as this has been changed on multiple occasions due to his fluctuating serum creatinine -Remains clinically fluid overloaded  CKD stage IV -Baseline creatinine 2.4-2.7 -Monitor with diuresis  Chronic atrial fibrillation -Rate controlled -Continue apixaban -continue bisoprolol  Diabetes mellitus type 2 -Check hemoglobin A1c -10/06/20 A1C--5.3  Thrombocytopenia -Secondary to splenomegaly in the setting of his marginal zone lymphoma -Patient follows with Dr. Delton Coombes -Monitor for signs of bleeding  OSA -Patient is not on CPAP at home  Essentialy HTN -continue bisoprolol -  holding amlodipine to allow BP margin for diuresis  CAD with hx  PCI -no chest pain -continue ASA, imdur, zocor   Morbid Obesity -BMI 41.56 -lifestyle modification  Hypothyroidism -continue synthroid  Hyperlipidemia -continue statin      Status is: Inpatient  Remains inpatient appropriate because:IV treatments appropriate due to intensity of illness or inability to take PO  Dispo: The patient is from: Home              Anticipated d/c is to: Home              Patient currently is not medically stable to d/c.   Difficult to place patient Yes        Family Communication:   no Family at bedside  Consultants:  none  Code Status:  FULL   DVT Prophylaxis:  apixaban   Procedures: As Listed in Progress Note Above  Antibiotics: None     Subjective: Patient denies fevers, chills, headache, chest pain, dyspnea, nausea, vomiting, diarrhea, abdominal pain, dysuria, hematuria, hematochezia, and melena.   Objective: Vitals:   01/20/21 0410 01/20/21 0448 01/20/21 0500 01/20/21 0600  BP:   (!) 154/80 134/79  Pulse:  95 83 77  Resp:  (!) 22 (!) 22 16  Temp: 98.3 F (36.8 C)  97.9 F (36.6 C)   TempSrc: Oral  Axillary   SpO2:   97% 97%  Weight:      Height:       No intake or output data in the 24 hours ending 01/20/21 0717 Weight change:  Exam:  General:  Pt is alert, follows commands appropriately, not in acute distress HEENT: No icterus, No thrush, No neck mass, [place]/AT Cardiovascular: IRRR, S1/S2, no rubs, no gallops Respiratory: bibasilar rales.  Bibasilar exp wheeze Abdomen: Soft/+BS, non tender, non distended, no guarding Extremities: 2+!LE edema, No lymphangitis, No petechiae, No rashes, no synovitis   Data Reviewed: I have personally reviewed following labs and imaging studies Basic Metabolic Panel: Recent Labs  Lab 01/14/21 1648 01/19/21 1930 01/20/21 0636  NA 142 142 143  K 3.9 3.5 3.5  CL 100 99 101  CO2 33* 33* 32  GLUCOSE 89 118* 76  BUN 34* 41* 38*  CREATININE 2.42* 2.44* 2.28*  CALCIUM 8.8*  8.7* 8.7*  MG  --   --  2.4  PHOS  --   --  4.1   Liver Function Tests: Recent Labs  Lab 01/14/21 1648 01/19/21 1930 01/20/21 0636  AST 14* 11* 12*  ALT 15 11 11   ALKPHOS 107 106 89  BILITOT 0.9 0.6 0.8  PROT 7.1 7.3 6.8  ALBUMIN 3.7 3.9 3.6   No results for input(s): LIPASE, AMYLASE in the last 168 hours. No results for input(s): AMMONIA in the last 168 hours. Coagulation Profile: Recent Labs  Lab 01/20/21 0636  INR 1.3*   CBC: Recent Labs  Lab 01/14/21 1648 01/19/21 1930 01/20/21 0636  WBC 6.6 6.5 6.2  NEUTROABS 5.1  --   --   HGB 14.0 14.1 14.0  HCT 46.3 47.3 47.4  MCV 93.5 94.4 94.6  PLT 117* 126* 121*   Cardiac Enzymes: No results for input(s): CKTOTAL, CKMB, CKMBINDEX, TROPONINI in the last 168 hours. BNP: Invalid input(s): POCBNP CBG: No results for input(s): GLUCAP in the last 168 hours. HbA1C: No results for input(s): HGBA1C in the last 72 hours. Urine analysis:    Component Value Date/Time   COLORURINE YELLOW 11/19/2020 [place] 11/19/2020 1736  LABSPEC 1.015 11/19/2020 1736   PHURINE 6.0 11/19/2020 1736   GLUCOSEU NEGATIVE 11/19/2020 1736   HGBUR NEGATIVE 11/19/2020 1736   BILIRUBINUR NEGATIVE 11/19/2020 1736   KETONESUR NEGATIVE 11/19/2020 1736   PROTEINUR >=300 (A) 11/19/2020 1736   UROBILINOGEN 4.0 (H) 07/23/2014 1356   NITRITE NEGATIVE 11/19/2020 1736   LEUKOCYTESUR NEGATIVE 11/19/2020 1736   Sepsis Labs: @LABRCNTIP (procalcitonin:4,lacticidven:4) ) Recent Results (from the past 240 hour(s))  Resp Panel by RT-PCR (Flu A&B, Covid) Nasopharyngeal Swab     Status: None   Collection Time: 01/19/21  7:30 PM   Specimen: Nasopharyngeal Swab; Nasopharyngeal(NP) swabs in vial transport medium  Result Value Ref Range Status   SARS Coronavirus 2 by RT PCR NEGATIVE NEGATIVE Final    Comment: (NOTE) SARS-CoV-2 target nucleic acids are NOT DETECTED.  The SARS-CoV-2 RNA is generally detectable in upper respiratory specimens  during the acute phase of infection. The lowest concentration of SARS-CoV-2 viral copies this assay can detect is 138 copies/mL. A negative result does not preclude SARS-Cov-2 infection and should not be used as the sole basis for treatment or other patient management decisions. A negative result may occur with  improper specimen collection/handling, submission of specimen other than nasopharyngeal swab, presence of viral mutation(s) within the areas targeted by this assay, and inadequate number of viral copies(<138 copies/mL). A negative result must be combined with clinical observations, patient history, and epidemiological information. The expected result is Negative.  Fact Sheet for Patients:  EntrepreneurPulse.com.au  Fact Sheet for Healthcare Providers:  IncredibleEmployment.be  This test is no t yet approved or cleared by the [place] FDA and  has been authorized for detection and/or diagnosis of SARS-CoV-2 by FDA under an Emergency Use Authorization (EUA). This EUA will remain  in effect (meaning this test can be used) for the duration of the COVID-19 declaration under Section 564(b)(1) of the Act, 21 U.S.C.section 360bbb-3(b)(1), unless the authorization is terminated  or revoked sooner.       Influenza A by PCR NEGATIVE NEGATIVE Final   Influenza B by PCR NEGATIVE NEGATIVE Final    Comment: (NOTE) The Xpert Xpress SARS-CoV-2/FLU/RSV plus assay is intended as an aid in the diagnosis of influenza from Nasopharyngeal swab specimens and should not be used as a sole basis for treatment. Nasal washings and aspirates are unacceptable for Xpert Xpress SARS-CoV-2/FLU/RSV testing.  Fact Sheet for Patients: EntrepreneurPulse.com.au  Fact Sheet for Healthcare Providers: IncredibleEmployment.be  This test is not yet approved or cleared by the [place] FDA and has been authorized for detection  and/or diagnosis of SARS-CoV-2 by FDA under an Emergency Use Authorization (EUA). This EUA will remain in effect (meaning this test can be used) for the duration of the COVID-19 declaration under Section 564(b)(1) of the Act, 21 U.S.C. section 360bbb-3(b)(1), unless the authorization is terminated or revoked.  Performed at [facility], [address] [place], [place] [address]      Scheduled Meds:  apixaban  5 mg Oral BID   aspirin EC  81 mg Oral Daily   bisoprolol  5 mg Oral Daily   chlorhexidine  15 mL Mouth Rinse BID   Chlorhexidine Gluconate Cloth  6 each Topical Daily   furosemide  40 mg Intravenous Q12H   levothyroxine  300 mcg Oral QAC breakfast   mouth rinse  15 mL Mouth Rinse q12n4p   mometasone-formoterol  2 puff Inhalation BID   potassium chloride SA  20 mEq Oral BID   simvastatin  5 mg Oral Daily  Continuous Infusions:  Procedures/Studies: DG Chest Portable 1 View  Result Date: 01/19/2021 CLINICAL DATA:  Shortness of breath EXAM: PORTABLE CHEST 1 VIEW COMPARISON:  01/14/2021 FINDINGS: Stable cardiomegaly. Low lung volumes. Pulmonary vascular congestion with diffuse interstitial prominence. Small bilateral pleural effusions. No pneumothorax. IMPRESSION: Cardiomegaly with pulmonary edema and small bilateral pleural effusions. Overall, findings are similar to the prior exam. Electronically Signed   By: Davina Poke D.O.   On: 01/19/2021 20:07   DG Chest Portable 1 View  Result Date: 01/14/2021 CLINICAL DATA:  Shortness of breath EXAM: PORTABLE CHEST 1 VIEW COMPARISON:  12/28/2020 FINDINGS: Low lung volumes. Elevation of the right hemidiaphragm. There is improved aeration at the left lung base with patchy opacity remaining. Pulmonary vascular congestion with probable mild edema. No significant pleural effusion. Similar cardiomediastinal contours with mild cardiomegaly. IMPRESSION: Pulmonary vascular congestion with probable mild pulmonary edema. Improved aeration at the  left lung base with patchy atelectasis/consolidation remaining. Electronically Signed   By: Macy Mis M.D.   On: 01/14/2021 16:13   DG Chest Portable 1 View  Result Date: 12/28/2020 CLINICAL DATA:  Shortness of breath EXAM: PORTABLE CHEST 1 VIEW COMPARISON:  11/19/2020 FINDINGS: Cardiomegaly with vascular congestion and diffuse interstitial opacity consistent with pulmonary edema. Small left greater than right pleural effusion. Basilar airspace disease on the left. Aortic atherosclerosis. No pneumothorax IMPRESSION: 1. Cardiomegaly with vascular congestion and pulmonary edema. 2. Small left greater than right pleural effusions with left basilar atelectasis or pneumonia. Similar findings on 11/19/2020 comparison Electronically Signed   By: Donavan Foil M.D.   On: 12/28/2020 03:20   DG Toe 3rd Right  Result Date: 12/28/2020 CLINICAL DATA:  Right third toe injury EXAM: RIGHT THIRD TOE COMPARISON:  None. FINDINGS: There is no evidence of fracture or dislocation. There is no evidence of arthropathy or other focal bone abnormality. Soft tissues are unremarkable. IMPRESSION: Negative. Electronically Signed   By: Monte Fantasia M.D.   On: 12/28/2020 05:39   ECHOCARDIOGRAM COMPLETE  Result Date: 12/28/2020    ECHOCARDIOGRAM REPORT   Patient Name:   [name] [name] Date of Exam: 12/28/2020 Medical Rec #:  [id number]     Height:       73.0 in Accession #:    [id number]    Weight:       308.6 lb Date of Birth:  27-Apr-1946     BSA:          2.587 m Patient Age:    13 years      BP:           146/83 mmHg Patient Gender: M             HR:           75 bpm. Exam Location:  [facility] Procedure: 2D Echo, Cardiac Doppler and Color Doppler Indications:    CHF  History:        Patient has prior history of Echocardiogram examinations, most                 recent 07/20/2020. CAD, Arrythmias:LBBB and Atrial Fibrillation;                 Risk Factors:Diabetes, Dyslipidemia, Hypertension and Current                 Smoker.   Sonographer:    Maudry Mayhew MHA, RDMS, RVT, RDCS Referring Phys: [id number] ASIA B Gillespie  Sonographer Comments: Suboptimal subcostal window. Image acquisition challenging due to  patient body habitus, Image acquisition challenging due to respiratory motion and on CPAP. IMPRESSIONS  1. Left ventricular ejection fraction, by estimation, is 55 to 60%. The left ventricle has normal function. The left ventricle has no regional wall motion abnormalities. The left ventricular internal cavity size was moderately dilated. Left ventricular diastolic function could not be evaluated.  2. Right ventricular systolic function is normal. The right ventricular size is mildly enlarged. There is moderately elevated pulmonary artery systolic pressure.  3. Left atrial size was mildly dilated.  4. Right atrial size was moderately dilated.  5. The mitral valve is normal in structure. Trivial mitral valve regurgitation. No evidence of mitral stenosis.  6. The aortic valve is tricuspid. Aortic valve regurgitation is not visualized. Mild aortic valve sclerosis is present, with no evidence of aortic valve stenosis.  7. The inferior vena cava is dilated in size with >50% respiratory variability, suggesting right atrial pressure of 8 mmHg. FINDINGS  Left Ventricle: Left ventricular ejection fraction, by estimation, is 55 to 60%. The left ventricle has normal function. The left ventricle has no regional wall motion abnormalities. Definity contrast agent was given IV to delineate the left ventricular  endocardial borders. The left ventricular internal cavity size was moderately dilated. There is no left ventricular hypertrophy. Left ventricular diastolic function could not be evaluated due to atrial fibrillation. Left ventricular diastolic function could not be evaluated. Right Ventricle: The right ventricular size is mildly enlarged.Right ventricular systolic function is normal. There is moderately elevated pulmonary artery systolic  pressure. The tricuspid regurgitant velocity is 3.54 m/s, and with an assumed right atrial pressure of 8 mmHg, the estimated right ventricular systolic pressure is 48.5 mmHg. Left Atrium: Left atrial size was mildly dilated. Right Atrium: Right atrial size was moderately dilated. Pericardium: There is no evidence of pericardial effusion. Mitral Valve: The mitral valve is normal in structure. Trivial mitral valve regurgitation. No evidence of mitral valve stenosis. Tricuspid Valve: The tricuspid valve is normal in structure. Tricuspid valve regurgitation is trivial. No evidence of tricuspid stenosis. Aortic Valve: The aortic valve is tricuspid. Aortic valve regurgitation is not visualized. Mild aortic valve sclerosis is present, with no evidence of aortic valve stenosis. Aortic valve mean gradient measures 5.0 mmHg. Aortic valve peak gradient measures 10.2 mmHg. Aortic valve area, by VTI measures 1.90 cm. Pulmonic Valve: The pulmonic valve was not well visualized. Pulmonic valve regurgitation is not visualized. No evidence of pulmonic stenosis. Aorta: The aortic root is normal in size and structure. Venous: The inferior vena cava is dilated in size with greater than 50% respiratory variability, suggesting right atrial pressure of 8 mmHg.   LEFT VENTRICLE PLAX 2D LVIDd:         6.30 cm LVIDs:         4.80 cm LV PW:         0.70 cm LV IVS:        0.90 cm LVOT diam:     2.00 cm LV SV:         60 LV SV Index:   23 LVOT Area:     3.14 cm  LV Volumes (MOD) LV vol d, MOD A2C: 186.0 ml LV vol d, MOD A4C: 153.0 ml LV vol s, MOD A2C: 71.4 ml LV vol s, MOD A4C: 104.0 ml LV SV MOD A2C:     114.6 ml LV SV MOD A4C:     153.0 ml LV SV MOD BP:      88.7 ml RIGHT VENTRICLE  TAPSE (M-mode): 1.7 cm LEFT ATRIUM              Index       RIGHT ATRIUM           Index LA diam:        4.10 cm  1.58 cm/m  RA Area:     32.20 cm LA Vol (A2C):   134.0 ml 51.79 ml/m RA Volume:   137.00 ml 52.95 ml/m LA Vol (A4C):   58.3 ml  22.53 ml/m LA  Biplane Vol: 90.1 ml  34.82 ml/m  AORTIC VALVE AV Area (Vmax):    1.84 cm AV Area (Vmean):   1.73 cm AV Area (VTI):     1.90 cm AV Vmax:           160.00 cm/s AV Vmean:          107.000 cm/s AV VTI:            0.315 m AV Peak Grad:      10.2 mmHg AV Mean Grad:      5.0 mmHg LVOT Vmax:         93.60 cm/s LVOT Vmean:        58.800 cm/s LVOT VTI:          0.191 m LVOT/AV VTI ratio: 0.61  AORTA Ao Root diam: 3.40 cm TRICUSPID VALVE TR Peak grad:   50.1 mmHg TR Vmax:        354.00 cm/s  SHUNTS Systemic VTI:  0.19 m Systemic Diam: 2.00 cm Kirk Ruths MD Electronically signed by Kirk Ruths MD Signature Date/Time: 12/28/2020/12:18:15 PM    Final     Orson Eva, DO  [facility]  If 7PM-7AM, please contact night-coverage [email or web address] Password [email or web address] 01/20/2021, 7:17 AM   LOS: 0 days

## 2021-01-20 NOTE — Evaluation (Addendum)
Physical Therapy Evaluation Patient Details Name: [name] [name] MRN: [id number] DOB: 1946/03/21 Today's Date: 01/20/2021   History of Present Illness  75 y.o. 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  The patient has had numerous hospitalizations with similar presentations.  Most recently, the patient was admitted to the hospital from 12/28/2020 to 01/02/2021 for acute on chronic diastolic CHF.  He was discharged to a skilled nursing facility with furosemide 40 mg daily.  Prior to that hospitalization, the patient was admitted from 10/06/2020 to 10/11/2020 once again for respiratory failure secondary to CHF exacerbation.  His discharge weight at that time was 132.4 kg.  It appears he was discharged home with furosemide 80 mg once daily at that time.  Once again, he was admitted from 08/04/2020 to 08/10/2020 for respiratory failure secondary to acute on chronic diastolic CHF.  He was discharged home on furosemide 80 mg once daily during the hospitalization.   Clinical Impression  Patient presented in bed and agreeable to therapy, patient was on 6 Lpm of O2 with SpO2 greater than 95% in bed. Patient demonstrated fair/good bed mobility and seated balance without a drop in SpO2. Patient needed to used the commode and demonstrated fair ambulation with RW to the commode. Patient was able to preform a sit to stand from the commode with modified independence with the use of rails. Patent then ambulated to the chair, SpO2 decreased to 75% during exertion but returned to 95% upon sitting down. Patient will  benefit from continued physical therapy in hospital and recommended venue below to increase strength, balance, endurance for safe ADLs and gait.      Follow Up Recommendations SNF;Supervision for mobility/OOB;Supervision - Intermittent    Equipment Recommendations  None recommended by PT    Recommendations for Other Services       Precautions / Restrictions Precautions Precautions: Fall Restrictions Weight Bearing Restrictions: No      Mobility  Bed Mobility Overal bed mobility: Modified Independent             General bed mobility comments: increased time Patient Response: Cooperative  Transfers Overall transfer level: Needs assistance Equipment used: Rolling walker (2 wheeled) Transfers: Sit to/from [facility] Sit to Stand: Min guard Stand pivot transfers: Min guard       General transfer comment: slow labored movement  Ambulation/Gait Ambulation/Gait assistance: Min guard Gait Distance (Feet): 20 Feet Assistive device: Rolling walker (2 wheeled) Gait Pattern/deviations: Decreased step length - right;Decreased step length - left;Decreased stride length Gait velocity: decreased   General Gait Details: slow labored cadence without loss of balance, limited mostly due to SOB with SpO2 dropping from 95% to 75% with exertion  Stairs            Wheelchair Mobility    Modified Rankin (Stroke Patients Only)       Balance Overall balance assessment: Needs assistance Sitting-balance support: Feet supported;No upper extremity supported Sitting balance-Leahy Scale: Good Sitting balance - Comments: seated at EOB   Standing balance support: During functional activity;Bilateral upper extremity supported Standing balance-Leahy Scale: Fair Standing balance comment: fair/good using RW  Pertinent Vitals/Pain Pain Assessment: Faces Faces Pain Scale: Hurts a little bit Pain Location: BLE Pain  Descriptors / Indicators: Sore Pain Intervention(s): Limited activity within patient's tolerance;Monitored during session;Repositioned    Home Living Family/patient expects to be discharged to:: Private residence Living Arrangements: Children Available Help at Discharge: Family;Available PRN/intermittently Type of Home: House Home Access: Ramped entrance;Stairs to enter Entrance Stairs-Rails: Right;Left;Can reach both Entrance Stairs-Number of Steps: 1 Home Layout: One level Home Equipment: Shower seat;Bedside commode;Walker - 2 wheels;Cane - single point;Toilet riser;Electric scooter;Walker - 4 wheels Additional Comments: getting a hospital bed in the next few weeks    Prior Function Level of Independence: Needs assistance   Gait / Transfers Assistance Needed: household ambulator using Rollator or RW, on 3-4 LPM home O2 constant, uses electric scooter for longer distances  ADL's / Homemaking Assistance Needed: assisted by family for community ADLs        Hand Dominance   Dominant Hand: Right    Extremity/Trunk Assessment   Upper Extremity Assessment Upper Extremity Assessment: Generalized weakness    Lower Extremity Assessment Lower Extremity Assessment: Generalized weakness    Cervical / Trunk Assessment Cervical / Trunk Assessment: Kyphotic  Communication   Communication: No difficulties  Cognition Arousal/Alertness: Awake/alert Behavior During Therapy: WFL for tasks assessed/performed Overall Cognitive Status: Within Functional Limits for tasks assessed                                        General Comments      Exercises     Assessment/Plan    PT Assessment Patient needs continued PT services  PT Problem List Decreased strength;Decreased activity tolerance;Decreased mobility;Decreased balance       PT Treatment Interventions DME instruction;Gait training;Stair training;Functional mobility training;Therapeutic activities;Therapeutic  exercise;Patient/family education;Balance training    PT Goals (Current goals can be found in the Care Plan section)  Acute Rehab PT Goals Patient Stated Goal: return home PT Goal Formulation: With patient Time For Goal Achievement: 02/03/21 Potential to Achieve Goals: Good    Frequency Min 3X/week   Barriers to discharge        Co-evaluation               AM-PAC PT "6 Clicks" Mobility  Outcome Measure Help needed turning from your back to your side while in a flat bed without using bedrails?: None Help needed moving from lying on your back to sitting on the side of a flat bed without using bedrails?: None Help needed moving to and from a bed to a chair (including a wheelchair)?: A Little Help needed standing up from a chair using your arms (e.g., wheelchair or bedside chair)?: A Little Help needed to walk in hospital room?: A Little Help needed climbing 3-5 steps with a railing? : A Lot 6 Click Score: 19    End of Session Equipment Utilized During Treatment: Oxygen Activity Tolerance: Patient tolerated treatment well;Patient limited by fatigue Patient left: in chair;with call bell/phone within reach Nurse Communication: Mobility status PT Visit Diagnosis: Unsteadiness on feet (R26.81);Other abnormalities of gait and mobility (R26.89);Muscle weakness (generalized) (M62.81)    Time: 9357-0177 PT Time Calculation (min) (ACUTE ONLY): 26 min   Charges:   PT Evaluation $PT Eval Moderate Complexity: 1 Mod PT Treatments $Therapeutic Activity: 23-37 mins       4:04 PM, 01/20/21 Margeret Stachnik Cousler SPT  4:04 PM, 01/20/21 [name], MPT Physical Therapist with  [facility] 336 [phone] office [phone] mobile phone

## 2021-01-21 DIAGNOSIS — N184 Chronic kidney disease, stage 4 (severe): Secondary | ICD-10-CM

## 2021-01-21 DIAGNOSIS — I5033 Acute on chronic diastolic (congestive) heart failure: Secondary | ICD-10-CM

## 2021-01-21 LAB — CBC
HCT: 47 % (ref 39.0–52.0)
Hemoglobin: 14.2 g/dL (ref 13.0–17.0)
MCH: 28.4 pg (ref 26.0–34.0)
MCHC: 30.2 g/dL (ref 30.0–36.0)
MCV: 94 fL (ref 80.0–100.0)
Platelets: 132 10*3/uL — ABNORMAL LOW (ref 150–400)
RBC: 5 MIL/uL (ref 4.22–5.81)
RDW: 16.2 % — ABNORMAL HIGH (ref 11.5–15.5)
WBC: 5.9 10*3/uL (ref 4.0–10.5)
nRBC: 0 % (ref 0.0–0.2)

## 2021-01-21 LAB — MAGNESIUM: Magnesium: 2.5 mg/dL — ABNORMAL HIGH (ref 1.7–2.4)

## 2021-01-21 LAB — BASIC METABOLIC PANEL
Anion gap: 12 (ref 5–15)
BUN: 48 mg/dL — ABNORMAL HIGH (ref 8–23)
CO2: 28 mmol/L (ref 22–32)
Calcium: 8.6 mg/dL — ABNORMAL LOW (ref 8.9–10.3)
Chloride: 99 mmol/L (ref 98–111)
Creatinine, Ser: 2.58 mg/dL — ABNORMAL HIGH (ref 0.61–1.24)
GFR, Estimated: 25 mL/min — ABNORMAL LOW (ref >60)
Glucose, Bld: 137 mg/dL — ABNORMAL HIGH (ref 70–99)
Potassium: 5.5 mmol/L — ABNORMAL HIGH (ref 3.5–5.1)
Sodium: 139 mmol/L (ref 135–145)

## 2021-01-21 LAB — HEMOGLOBIN A1C
Hgb A1c MFr Bld: 5.8 % — ABNORMAL HIGH (ref 4.8–5.6)
Mean Plasma Glucose: 119.76 mg/dL

## 2021-01-21 MED ORDER — SALINE SPRAY 0.65 % NA SOLN
1.0000 | NASAL | Status: DC | PRN
  Filled 2021-01-21: qty 44

## 2021-01-21 NOTE — Plan of Care (Signed)

## 2021-01-21 NOTE — Progress Notes (Signed)
PROGRESS NOTE  [name] [name] TGY:[id number] DOB: Dec 04, 1945 DOA: 01/19/2021 PCP: Nickola Major, MD  Brief History:  75 y.o. 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  The patient has had numerous hospitalizations with similar presentations.  Most recently, the patient was admitted to the hospital from 12/28/2020 to 01/02/2021 for acute on chronic diastolic CHF.  He was discharged to a skilled nursing facility with furosemide 40 mg daily.  Prior to that hospitalization, the patient was admitted from 10/06/2020 to 10/11/2020 once again for respiratory failure secondary to CHF exacerbation.  His discharge weight at that time was 132.4 kg.  It appears he was discharged home with furosemide 80 mg once daily at that time.  Once again, he was admitted from 08/04/2020 to 08/10/2020 for respiratory failure secondary to acute on chronic diastolic CHF.  He was discharged home on furosemide 80 mg once daily during the hospitalization.   Notably, the patient was discharged to a skilled nursing facility from his most recent hospitalization, but he left only after a few days because "he did not like it".  He most recently visited the emergency department on 01/14/2021 with fluid overload symptoms.  He was told to increase his furosemide, although the patient does not remember being told this.  Nevertheless, it appears that the patient's furosemide dosing has been changed on numerous occasions based upon his fluctuating serum creatinine.  In addition, the patient endorses drinking "plenty of fluid".  He  states that he eats carryout food approximately 3 times per week.  In addition, he has not infrequently missed his furosemide dosing.   He states that he has been more short of breath since he left the skilled nursing facility, but his breathing has been worse for the past week.  He denies any fevers, chills, chest pain, nausea,, diarrhea, abd pain.  In the emergency department, the patient was afebrile hemodynamically stable with oxygen saturations 95%.  The patient was placed on BiPAP secondary to VBG showing hypercarbia.  He was admitted for further evaluation and treatment of his CHF and respiratory failure.   Assessment/Plan: Acute on chronic respiratory failure with hypoxia and hypercarbia -Secondary to pulmonary edema in the setting of OSA -Wean off BiPAP>>still requiring intermittently for rest and at hs -Personally reviewed chest x-ray--increased interstitial markings, bilateral pleural effusions -Patient is chronically on 3 L at home but he frequently increases it as needed   Acute on chronic diastolic CHF -2/87/6811 echo EF 55 to 60%, no WMA, trivial MR/TR, moderate PASP--58.1 mmHg -Daily weights -Continue furosemide 80 mg IV twice daily -Patient endorses indiscretion with diet and some poor compliance with furosemide -In addition, the patient appears confused with his furosemide dosing as this has been changed on multiple occasions due to his fluctuating serum creatinine -Remains clinically fluid overloaded  COPD Exacerbation -start IV solumedrol -continue pulmicort and duonebs   CKD stage IV -Baseline creatinine 2.4-2.7 -will need to tolerate higher serum creatinine for improved euvolemia   Chronic atrial fibrillation -Rate controlled -Continue apixaban -continue bisoprolol   Diabetes mellitus type 2 -01/20/21 hemoglobin A1c 5.8 -10/06/20 A1C--5.3   Thrombocytopenia -Secondary to  splenomegaly in the setting of his marginal zone lymphoma -Patient follows with Dr.  Delton Coombes -Monitor for signs of bleeding   OSA -Patient is not on CPAP at home   Essentialy HTN -continue bisoprolol -holding amlodipine to allow BP margin for diuresis   CAD with hx PCI -no chest pain -continue ASA, imdur, zocor   Morbid Obesity -BMI 41.56 -lifestyle modification   Hypothyroidism -continue synthroid   Hyperlipidemia -continue statin           Status is: Inpatient   Remains inpatient appropriate because:IV treatments appropriate due to intensity of illness or inability to take PO   Dispo: The patient is from: Home              Anticipated d/c is to: SNF              Patient currently is not medically stable to d/c.              Difficult to place patient Yes               Family Communication:   no Family at bedside   Consultants:  palliative med   Code Status:  FULL   DVT Prophylaxis:  apixaban     Procedures: As Listed in Progress Note Above   Antibiotics: None   Subjective: Patient states he is breathing better, but remains sob with minimal exertion.  Denies f/c , cp, n/v/d, abd pain  Objective: Vitals:   01/21/21 1100 01/21/21 1200 01/21/21 1300 01/21/21 1400  BP: 128/89   128/61  Pulse: 82 76  77  Resp: 17 15    Temp:   98.1 F (36.7 C)   TempSrc:   Oral   SpO2: 92% 97%  96%  Weight:      Height:        Intake/Output Summary (Last 24 hours) at 01/21/2021 1707 Last data filed at 01/21/2021 1500 Gross per 24 hour  Intake 840 ml  Output 1250 ml  Net -410 ml   Weight change: -0.183 kg Exam:  General:  Pt is alert, follows commands appropriately, not in acute distress HEENT: No icterus, No thrush, No neck mass, [place]/AT Cardiovascular: RRR, S1/S2, no rubs, no gallops Respiratory: bibasilar rales.  Bibasilar wheeze Abdomen: Soft/+BS, non tender, non distended, no guarding Extremities: 1+ LE edema, No lymphangitis, No petechiae, No rashes, no synovitis   Data Reviewed: I have personally reviewed following labs  and imaging studies Basic Metabolic Panel: Recent Labs  Lab 01/19/21 1930 01/20/21 0636 01/21/21 0715  NA 142 143 139  K 3.5 3.5 5.5*  CL 99 101 99  CO2 33* 32 28  GLUCOSE 118* 76 137*  BUN 41* 38* 48*  CREATININE 2.44* 2.28* 2.58*  CALCIUM 8.7* 8.7* 8.6*  MG  --  2.4 2.5*  PHOS  --  4.1  --    Liver Function Tests: Recent Labs  Lab 01/19/21 1930 01/20/21 0636  AST 11* 12*  ALT 11 11  ALKPHOS 106 89  BILITOT 0.6 0.8  PROT 7.3 6.8  ALBUMIN 3.9 3.6   No results for input(s): LIPASE, AMYLASE in the last 168 hours. No results for input(s): AMMONIA in the last 168 hours. Coagulation Profile: Recent Labs  Lab 01/20/21 0636  INR 1.3*   CBC: Recent Labs  Lab 01/19/21 1930 01/20/21 0636 01/21/21 0715  WBC 6.5 6.2 5.9  HGB 14.1 14.0 14.2  HCT 47.3 47.4 47.0  MCV 94.4 94.6 94.0  PLT 126* 121* 132*  Cardiac Enzymes: No results for input(s): CKTOTAL, CKMB, CKMBINDEX, TROPONINI in the last 168 hours. BNP: Invalid input(s): POCBNP CBG: No results for input(s): GLUCAP in the last 168 hours. HbA1C: Recent Labs    01/21/21 0715  HGBA1C 5.8*   Urine analysis:    Component Value Date/Time   COLORURINE YELLOW 11/19/2020 1736   APPEARANCEUR CLEAR 11/19/2020 1736   LABSPEC 1.015 11/19/2020 1736   PHURINE 6.0 11/19/2020 1736   GLUCOSEU NEGATIVE 11/19/2020 1736   HGBUR NEGATIVE 11/19/2020 1736   BILIRUBINUR NEGATIVE 11/19/2020 1736   KETONESUR NEGATIVE 11/19/2020 1736   PROTEINUR >=300 (A) 11/19/2020 1736   UROBILINOGEN 4.0 (H) 07/23/2014 1356   NITRITE NEGATIVE 11/19/2020 1736   LEUKOCYTESUR NEGATIVE 11/19/2020 1736   Sepsis Labs: @LABRCNTIP (procalcitonin:4,lacticidven:4) ) Recent Results (from the past 240 hour(s))  Resp Panel by RT-PCR (Flu A&B, Covid) Nasopharyngeal Swab     Status: None   Collection Time: 01/19/21  7:30 PM   Specimen: Nasopharyngeal Swab; Nasopharyngeal(NP) swabs in vial transport medium  Result Value Ref Range Status   SARS  Coronavirus 2 by RT PCR NEGATIVE NEGATIVE Final    Comment: (NOTE) SARS-CoV-2 target nucleic acids are NOT DETECTED.  The SARS-CoV-2 RNA is generally detectable in upper respiratory specimens during the acute phase of infection. The lowest concentration of SARS-CoV-2 viral copies this assay can detect is 138 copies/mL. A negative result does not preclude SARS-Cov-2 infection and should not be used as the sole basis for treatment or other patient management decisions. A negative result may occur with  improper specimen collection/handling, submission of specimen other than nasopharyngeal swab, presence of viral mutation(s) within the areas targeted by this assay, and inadequate number of viral copies(<138 copies/mL). A negative result must be combined with clinical observations, patient history, and epidemiological information. The expected result is Negative.  Fact Sheet for Patients:  EntrepreneurPulse.com.au  Fact Sheet for Healthcare Providers:  IncredibleEmployment.be  This test is no t yet approved or cleared by the [place] FDA and  has been authorized for detection and/or diagnosis of SARS-CoV-2 by FDA under an Emergency Use Authorization (EUA). This EUA will remain  in effect (meaning this test can be used) for the duration of the COVID-19 declaration under Section 564(b)(1) of the Act, 21 U.S.C.section 360bbb-3(b)(1), unless the authorization is terminated  or revoked sooner.       Influenza A by PCR NEGATIVE NEGATIVE Final   Influenza B by PCR NEGATIVE NEGATIVE Final    Comment: (NOTE) The Xpert Xpress SARS-CoV-2/FLU/RSV plus assay is intended as an aid in the diagnosis of influenza from Nasopharyngeal swab specimens and should not be used as a sole basis for treatment. Nasal washings and aspirates are unacceptable for Xpert Xpress SARS-CoV-2/FLU/RSV testing.  Fact Sheet for  Patients: EntrepreneurPulse.com.au  Fact Sheet for Healthcare Providers: IncredibleEmployment.be  This test is not yet approved or cleared by the [place] FDA and has been authorized for detection and/or diagnosis of SARS-CoV-2 by FDA under an Emergency Use Authorization (EUA). This EUA will remain in effect (meaning this test can be used) for the duration of the COVID-19 declaration under Section 564(b)(1) of the Act, 21 U.S.C. section 360bbb-3(b)(1), unless the authorization is terminated or revoked.  Performed at [facility], [address], [place], [place] [address]   MRSA Next Gen by PCR, Nasal     Status: None   Collection Time: 01/20/21  5:00 AM   Specimen: Nasal Mucosa; Nasal Swab  Result Value Ref Range Status   MRSA  by PCR Next Gen NOT DETECTED NOT DETECTED Final    Comment: (NOTE) The GeneXpert MRSA Assay (FDA approved for NASAL specimens only), is one component of a comprehensive MRSA colonization surveillance program. It is not intended to diagnose MRSA infection nor to guide or monitor treatment for MRSA infections. Test performance is not FDA approved in patients less than 41 years old. Performed at [facility], [address], [place], [place] [address]      Scheduled Meds:  apixaban  5 mg Oral BID   aspirin EC  81 mg Oral Daily   bisoprolol  5 mg Oral Daily   budesonide (PULMICORT) nebulizer solution  0.5 mg Nebulization BID   chlorhexidine  15 mL Mouth Rinse BID   Chlorhexidine Gluconate Cloth  6 each Topical Daily   furosemide  80 mg Intravenous Q12H   ipratropium-albuterol  3 mL Nebulization Q6H   isosorbide mononitrate  30 mg Oral Daily   levothyroxine  300 mcg Oral QAC breakfast   mouth rinse  15 mL Mouth Rinse q12n4p   methylPREDNISolone (SOLU-MEDROL) injection  80 mg Intravenous Q12H   simvastatin  5 mg Oral Daily   Continuous Infusions:  Procedures/Studies: NM Pulmonary Perfusion  Result Date:  01/20/2021 CLINICAL DATA:  PE suspected, low intermediate probability, positive D-dimer, shortness of breath and chest pain for 6 months EXAM: NUCLEAR MEDICINE PERFUSION LUNG SCAN TECHNIQUE: Perfusion images were obtained in multiple projections after intravenous injection of radiopharmaceutical. Ventilation scans intentionally deferred if perfusion scan and chest x-ray adequate for interpretation during COVID 19 epidemic. RADIOPHARMACEUTICALS:  4.0 mCi Tc-16m MAA IV COMPARISON:  Chest radiographs, 01/19/2021 FINDINGS: Homogeneous bilateral pulmonary perfusion without suspicious perfusion defect. Cardiomegaly. IMPRESSION: Very low probability examination for pulmonary embolism by modified perfusion only PIOPED criteria (PE absent). Electronically Signed   By: Eddie Candle M.D.   On: 01/20/2021 11:38   DG Chest Portable 1 View  Result Date: 01/19/2021 CLINICAL DATA:  Shortness of breath EXAM: PORTABLE CHEST 1 VIEW COMPARISON:  01/14/2021 FINDINGS: Stable cardiomegaly. Low lung volumes. Pulmonary vascular congestion with diffuse interstitial prominence. Small bilateral pleural effusions. No pneumothorax. IMPRESSION: Cardiomegaly with pulmonary edema and small bilateral pleural effusions. Overall, findings are similar to the prior exam. Electronically Signed   By: Davina Poke D.O.   On: 01/19/2021 20:07   DG Chest Portable 1 View  Result Date: 01/14/2021 CLINICAL DATA:  Shortness of breath EXAM: PORTABLE CHEST 1 VIEW COMPARISON:  12/28/2020 FINDINGS: Low lung volumes. Elevation of the right hemidiaphragm. There is improved aeration at the left lung base with patchy opacity remaining. Pulmonary vascular congestion with probable mild edema. No significant pleural effusion. Similar cardiomediastinal contours with mild cardiomegaly. IMPRESSION: Pulmonary vascular congestion with probable mild pulmonary edema. Improved aeration at the left lung base with patchy atelectasis/consolidation remaining.  Electronically Signed   By: Macy Mis M.D.   On: 01/14/2021 16:13   DG Chest Portable 1 View  Result Date: 12/28/2020 CLINICAL DATA:  Shortness of breath EXAM: PORTABLE CHEST 1 VIEW COMPARISON:  11/19/2020 FINDINGS: Cardiomegaly with vascular congestion and diffuse interstitial opacity consistent with pulmonary edema. Small left greater than right pleural effusion. Basilar airspace disease on the left. Aortic atherosclerosis. No pneumothorax IMPRESSION: 1. Cardiomegaly with vascular congestion and pulmonary edema. 2. Small left greater than right pleural effusions with left basilar atelectasis or pneumonia. Similar findings on 11/19/2020 comparison Electronically Signed   By: Donavan Foil M.D.   On: 12/28/2020 03:20   DG Toe 3rd Right  Result Date:  12/28/2020 CLINICAL DATA:  Right third toe injury EXAM: RIGHT THIRD TOE COMPARISON:  None. FINDINGS: There is no evidence of fracture or dislocation. There is no evidence of arthropathy or other focal bone abnormality. Soft tissues are unremarkable. IMPRESSION: Negative. Electronically Signed   By: Monte Fantasia M.D.   On: 12/28/2020 05:39   ECHOCARDIOGRAM COMPLETE  Result Date: 12/28/2020    ECHOCARDIOGRAM REPORT   Patient Name:   PARAG DORTON Date of Exam: 12/28/2020 Medical Rec #:  [id number]     Height:       73.0 in Accession #:    [id number]    Weight:       308.6 lb Date of Birth:  May 13, 1946     BSA:          2.587 m Patient Age:    24 years      BP:           146/83 mmHg Patient Gender: M             HR:           75 bpm. Exam Location:  [facility] Procedure: 2D Echo, Cardiac Doppler and Color Doppler Indications:    CHF  History:        Patient has prior history of Echocardiogram examinations, most                 recent 07/20/2020. CAD, Arrythmias:LBBB and Atrial Fibrillation;                 Risk Factors:Diabetes, Dyslipidemia, Hypertension and Current                 Smoker.  Sonographer:    Maudry Mayhew MHA, RDMS, RVT, RDCS Referring  Phys: [id number] ASIA B [place]  Sonographer Comments: Suboptimal subcostal window. Image acquisition challenging due to patient body habitus, Image acquisition challenging due to respiratory motion and on CPAP. IMPRESSIONS  1. Left ventricular ejection fraction, by estimation, is 55 to 60%. The left ventricle has normal function. The left ventricle has no regional wall motion abnormalities. The left ventricular internal cavity size was moderately dilated. Left ventricular diastolic function could not be evaluated.  2. Right ventricular systolic function is normal. The right ventricular size is mildly enlarged. There is moderately elevated pulmonary artery systolic pressure.  3. Left atrial size was mildly dilated.  4. Right atrial size was moderately dilated.  5. The mitral valve is normal in structure. Trivial mitral valve regurgitation. No evidence of mitral stenosis.  6. The aortic valve is tricuspid. Aortic valve regurgitation is not visualized. Mild aortic valve sclerosis is present, with no evidence of aortic valve stenosis.  7. The inferior vena cava is dilated in size with >50% respiratory variability, suggesting right atrial pressure of 8 mmHg. FINDINGS  Left Ventricle: Left ventricular ejection fraction, by estimation, is 55 to 60%. The left ventricle has normal function. The left ventricle has no regional wall motion abnormalities. Definity contrast agent was given IV to delineate the left ventricular  endocardial borders. The left ventricular internal cavity size was moderately dilated. There is no left ventricular hypertrophy. Left ventricular diastolic function could not be evaluated due to atrial fibrillation. Left ventricular diastolic function could not be evaluated. Right Ventricle: The right ventricular size is mildly enlarged.Right ventricular systolic function is normal. There is moderately elevated pulmonary artery systolic pressure. The tricuspid regurgitant velocity is 3.54 m/s, and with  an assumed right atrial pressure of 8 mmHg, the estimated right  ventricular systolic pressure is 81.0 mmHg. Left Atrium: Left atrial size was mildly dilated. Right Atrium: Right atrial size was moderately dilated. Pericardium: There is no evidence of pericardial effusion. Mitral Valve: The mitral valve is normal in structure. Trivial mitral valve regurgitation. No evidence of mitral valve stenosis. Tricuspid Valve: The tricuspid valve is normal in structure. Tricuspid valve regurgitation is trivial. No evidence of tricuspid stenosis. Aortic Valve: The aortic valve is tricuspid. Aortic valve regurgitation is not visualized. Mild aortic valve sclerosis is present, with no evidence of aortic valve stenosis. Aortic valve mean gradient measures 5.0 mmHg. Aortic valve peak gradient measures 10.2 mmHg. Aortic valve area, by VTI measures 1.90 cm. Pulmonic Valve: The pulmonic valve was not well visualized. Pulmonic valve regurgitation is not visualized. No evidence of pulmonic stenosis. Aorta: The aortic root is normal in size and structure. Venous: The inferior vena cava is dilated in size with greater than 50% respiratory variability, suggesting right atrial pressure of 8 mmHg.   LEFT VENTRICLE PLAX 2D LVIDd:         6.30 cm LVIDs:         4.80 cm LV PW:         0.70 cm LV IVS:        0.90 cm LVOT diam:     2.00 cm LV SV:         60 LV SV Index:   23 LVOT Area:     3.14 cm  LV Volumes (MOD) LV vol d, MOD A2C: 186.0 ml LV vol d, MOD A4C: 153.0 ml LV vol s, MOD A2C: 71.4 ml LV vol s, MOD A4C: 104.0 ml LV SV MOD A2C:     114.6 ml LV SV MOD A4C:     153.0 ml LV SV MOD BP:      88.7 ml RIGHT VENTRICLE TAPSE (M-mode): 1.7 cm LEFT ATRIUM              Index       RIGHT ATRIUM           Index LA diam:        4.10 cm  1.58 cm/m  RA Area:     32.20 cm LA Vol (A2C):   134.0 ml 51.79 ml/m RA Volume:   137.00 ml 52.95 ml/m LA Vol (A4C):   58.3 ml  22.53 ml/m LA Biplane Vol: 90.1 ml  34.82 ml/m  AORTIC VALVE AV Area (Vmax):     1.84 cm AV Area (Vmean):   1.73 cm AV Area (VTI):     1.90 cm AV Vmax:           160.00 cm/s AV Vmean:          107.000 cm/s AV VTI:            0.315 m AV Peak Grad:      10.2 mmHg AV Mean Grad:      5.0 mmHg LVOT Vmax:         93.60 cm/s LVOT Vmean:        58.800 cm/s LVOT VTI:          0.191 m LVOT/AV VTI ratio: 0.61  AORTA Ao Root diam: 3.40 cm TRICUSPID VALVE TR Peak grad:   50.1 mmHg TR Vmax:        354.00 cm/s  SHUNTS Systemic VTI:  0.19 m Systemic Diam: 2.00 cm Kirk Ruths MD Electronically signed by Kirk Ruths MD Signature Date/Time: 12/28/2020/12:18:15 PM    Final  Orson Eva, DO  [facility]  If 7PM-7AM, please contact night-coverage [email or web address] Password [email or web address] 01/21/2021, 5:07 PM   LOS: 1 day

## 2021-01-21 NOTE — Progress Notes (Signed)
Palliative: [name], [name] [name], is lying quietly in bed.  He greets me making and mostly keeping eye contact.  He appears morbidly obese, chronically ill and somewhat weak.  He is alert and oriented, able to make his needs known.  There is no family at bedside at this time.  [name] [name] tells me that he slept better last night than he has in a long time.  We talked about the use of BiPAP/CPAP.  He tells me that he does not have a device at home, but would use it if qualified.  We talked at length about heart failure exacerbation, the treatment plan, respiratory failure related to fluid overload/heart failure.  We talked about CODE STATUS.  [name] [name] tells me that he would not want to be on life support, but does not believe that his daughter, [name] [name], who is his healthcare surrogate, could accept this decision.  I asked [name] [name] if he were to accept life support, for how long?  He tells me that he would not want a trach/PEG.  I shared that this is ultimate respect for him, talking about what he does and does not want.  We talked about outpatient palliative services following to continue goals of care/CODE STATUS discussions.  [name] [name] readily agrees to outpatient palliative services.  [name] [name] tells me that he would agree to short-term rehab if he is accepted into a facility that he would approve.  Conference with attending, bedside nursing staff, transition of care team related to patient condition, needs, goals of care, disposition.  Plan: Continue to treat the treatable.  States he would not want CPR, but unable to set this limit today.  Agreeable to short-term rehab if accepted into a facility that he would approve of.  Agreeable to outpatient palliative services to continue goals of care/CODE STATUS discussions.  46 minutes Quinn Axe, NP Palliative medicine team Team phone [phone] Greater than 50% of this time was spent counseling and coordinating care related to the above assessment and plan.

## 2021-01-22 DIAGNOSIS — I10 Essential (primary) hypertension: Secondary | ICD-10-CM

## 2021-01-22 DIAGNOSIS — R7989 Other specified abnormal findings of blood chemistry: Secondary | ICD-10-CM

## 2021-01-22 LAB — CBC
HCT: 45.5 % (ref 39.0–52.0)
Hemoglobin: 13.9 g/dL (ref 13.0–17.0)
MCH: 28.2 pg (ref 26.0–34.0)
MCHC: 30.5 g/dL (ref 30.0–36.0)
MCV: 92.3 fL (ref 80.0–100.0)
Platelets: 174 10*3/uL (ref 150–400)
RBC: 4.93 MIL/uL (ref 4.22–5.81)
RDW: 16.3 % — ABNORMAL HIGH (ref 11.5–15.5)
WBC: 6.5 10*3/uL (ref 4.0–10.5)
nRBC: 0 % (ref 0.0–0.2)

## 2021-01-22 LAB — MAGNESIUM: Magnesium: 2.5 mg/dL — ABNORMAL HIGH (ref 1.7–2.4)

## 2021-01-22 LAB — BASIC METABOLIC PANEL
Anion gap: 9 (ref 5–15)
BUN: 64 mg/dL — ABNORMAL HIGH (ref 8–23)
CO2: 31 mmol/L (ref 22–32)
Calcium: 8.9 mg/dL (ref 8.9–10.3)
Chloride: 98 mmol/L (ref 98–111)
Creatinine, Ser: 2.91 mg/dL — ABNORMAL HIGH (ref 0.61–1.24)
GFR, Estimated: 22 mL/min — ABNORMAL LOW (ref >60)
Glucose, Bld: 130 mg/dL — ABNORMAL HIGH (ref 70–99)
Potassium: 4.4 mmol/L (ref 3.5–5.1)
Sodium: 138 mmol/L (ref 135–145)

## 2021-01-22 MED ORDER — NITROGLYCERIN 0.4 MG SL SUBL: 0.4000 mg | SUBLINGUAL_TABLET | SUBLINGUAL | Status: DC | PRN

## 2021-01-22 MED ORDER — FUROSEMIDE 10 MG/ML IJ SOLN: 80.0000 mg | Freq: Every day | INTRAMUSCULAR | Status: DC

## 2021-01-22 MED ORDER — SENNOSIDES-DOCUSATE SODIUM 8.6-50 MG PO TABS: 1.0000 | ORAL_TABLET | Freq: Every day | ORAL | Status: DC | PRN

## 2021-01-22 NOTE — Progress Notes (Signed)
PROGRESS NOTE   [name] [name]  VHQ:[id number] DOB: [birth year] DOA: 01/19/2021 PCP: Nickola Major, MD   Chief Complaint  Patient presents with   Shortness of Breath   Level of care: Stepdown  Brief Admission History:  75 y.o. 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  The patient has had numerous hospitalizations with similar presentations.   Assessment & Plan:   Active Problems:   Chronic kidney disease Stage IV   Morbid obesity (HCC)   Type 2 diabetes mellitus with stage 4 chronic kidney disease (HCC)   Hypertension   Elevated troponin   Lactic acidosis   Hypothyroidism   Acute on chronic respiratory failure with hypoxia and hypercapnia (HCC)   Acute on chronic diastolic CHF (congestive heart failure) (HCC)   Pleural effusion   Thrombocytopenia (HCC)   Elevated d-dimer   Atrial fibrillation, chronic (HCC)   CKD (chronic kidney disease) stage 4, GFR 15-29 ml/min (HCC)   Acute on chronic respiratory failure with hypercarbia and hypoxia - Secondary to CHF exacerbation being treated by diuresis and bipap therapy.  Wean bipap as able.    Acute HFpEF - continue IV lasix for diuresis, monitor renal function and electrolytes.    COPD with exacerbation - continue IV steroids and bronchodilators as ordered.    Stage IV CKD - baseline 2.5-3, follow with diuresis  Chronic atrial fibrillation - rate is controlled with bisoprolol and fully anticoagulated with apixaban.   OSA - pt needs sleep study, plan to refer to sleep center for ambulatory referral at discharge.    CAD - no CP symptoms, resumed home aspirin, imdur, zocor  Essential hypertension - resume home bisoprolol   Thrombocytopenia - platelet count normalized up to 174 today.   DVT prophylaxis: apixaban  Code Status: full  Family Communication: discussed with patient at bedside, verbalized understanding Disposition: home  Status is: Inpatient  Not inpatient appropriate, will call UM team and downgrade to OBS.   Dispo: The patient is from: Home              Anticipated d/c is to: Home              Patient currently is not medically stable to d/c.   Difficult to place patient No    Consultants:  N/a   Procedures:  N/a  Antimicrobials:  N/a    Subjective: Pt reports that he felt better when he is using the nightly bipap therapy.   No CP and no SOB.    Objective: Vitals:   01/22/21 1100 01/22/21 1123 01/22/21 1200 01/22/21 1407  BP:   115/74   Pulse: 94 89 72   Resp: 20 19 (!) 22   Temp:  (!) 97.4 F (36.3 C)    TempSrc:  Oral    SpO2: 98% 98% 97% 96%  Weight:      Height:        Intake/Output Summary (Last 24 hours) at 01/22/2021 1419 Last data filed at 01/22/2021 1300 Gross per 24 hour  Intake 1140 ml  Output 1650 ml  Net -510 ml   Filed Weights   01/19/21 1929 01/21/21 0456 01/22/21 0510  Weight: Marland Kitchen)  142.9 kg (!) 142.7 kg (!) 145.8 kg    Examination:  General exam: Appears calm and comfortable  Respiratory system: Clear to auscultation. Respiratory effort normal. Cardiovascular system: normal S1 & S2 heard. No JVD, murmurs, rubs, gallops or clicks. No pedal edema. Gastrointestinal system: Abdomen is nondistended, soft and nontender. No organomegaly or masses felt. Normal bowel sounds heard. Central nervous system: Alert and oriented. No focal neurological deficits. Extremities: Symmetric 5 x 5 power. Skin: No rashes, lesions or ulcers Psychiatry: Judgement and insight appear normal. Mood & affect appropriate.   Data Reviewed: I have personally reviewed  following labs and imaging studies  CBC: Recent Labs  Lab 01/19/21 1930 01/20/21 0636 01/21/21 0715 01/22/21 0347  WBC 6.5 6.2 5.9 6.5  HGB 14.1 14.0 14.2 13.9  HCT 47.3 47.4 47.0 45.5  MCV 94.4 94.6 94.0 92.3  PLT 126* 121* 132* 960    Basic Metabolic Panel: Recent Labs  Lab 01/19/21 1930 01/20/21 0636 01/21/21 0715 01/22/21 0347  NA 142 143 139 138  K 3.5 3.5 5.5* 4.4  CL 99 101 99 98  CO2 33* 32 28 31  GLUCOSE 118* 76 137* 130*  BUN 41* 38* 48* 64*  CREATININE 2.44* 2.28* 2.58* 2.91*  CALCIUM 8.7* 8.7* 8.6* 8.9  MG  --  2.4 2.5* 2.5*  PHOS  --  4.1  --   --     GFR: Estimated Creatinine Clearance: 34 mL/min (A) (by C-G formula based on SCr of 2.91 mg/dL (H)).  Liver Function Tests: Recent Labs  Lab 01/19/21 1930 01/20/21 0636  AST 11* 12*  ALT 11 11  ALKPHOS 106 89  BILITOT 0.6 0.8  PROT 7.3 6.8  ALBUMIN 3.9 3.6    CBG: No results for input(s): GLUCAP in the last 168 hours.  Recent Results (from the past 240 hour(s))  Resp Panel by RT-PCR (Flu A&B, Covid) Nasopharyngeal Swab     Status: None   Collection Time: 01/19/21  7:30 PM   Specimen: Nasopharyngeal Swab; Nasopharyngeal(NP) swabs in vial transport medium  Result Value Ref Range Status   SARS Coronavirus 2 by RT PCR NEGATIVE NEGATIVE Final    Comment: (NOTE) SARS-CoV-2 target nucleic acids are NOT DETECTED.  The SARS-CoV-2 RNA is generally detectable in upper respiratory specimens during the acute phase of infection. The lowest concentration of SARS-CoV-2 viral copies this assay can detect is 138 copies/mL. A negative result does not preclude SARS-Cov-2 infection and should not be used as the sole basis for treatment or other patient management decisions. A negative result may occur with  improper specimen collection/handling, submission of specimen other than nasopharyngeal swab, presence of viral mutation(s) within the areas targeted by this assay, and inadequate number of  viral copies(<138 copies/mL). A negative result must be combined with clinical observations, patient history, and epidemiological information. The expected result is Negative.  Fact Sheet for Patients:  EntrepreneurPulse.com.au  Fact Sheet for Healthcare Providers:  IncredibleEmployment.be  This test is no t yet approved or cleared by the [place] FDA and  has been authorized for detection and/or diagnosis of SARS-CoV-2 by FDA under an Emergency Use Authorization (EUA). This EUA will remain  in effect (meaning this test can be used) for the duration of the COVID-19 declaration under Section 564(b)(1) of the Act, 21 U.S.C.section 360bbb-3(b)(1), unless the authorization is terminated  or revoked sooner.       Influenza A by PCR NEGATIVE NEGATIVE Final   Influenza B by PCR NEGATIVE NEGATIVE Final  Comment: (NOTE) The Xpert Xpress SARS-CoV-2/FLU/RSV plus assay is intended as an aid in the diagnosis of influenza from Nasopharyngeal swab specimens and should not be used as a sole basis for treatment. Nasal washings and aspirates are unacceptable for Xpert Xpress SARS-CoV-2/FLU/RSV testing.  Fact Sheet for Patients: EntrepreneurPulse.com.au  Fact Sheet for Healthcare Providers: IncredibleEmployment.be  This test is not yet approved or cleared by the [place] FDA and has been authorized for detection and/or diagnosis of SARS-CoV-2 by FDA under an Emergency Use Authorization (EUA). This EUA will remain in effect (meaning this test can be used) for the duration of the COVID-19 declaration under Section 564(b)(1) of the Act, 21 U.S.C. section 360bbb-3(b)(1), unless the authorization is terminated or revoked.  Performed at [facility], [address] [place], [place] [address]   MRSA Next Gen by PCR, Nasal     Status: None   Collection Time: 01/20/21  5:00 AM   Specimen: Nasal Mucosa; Nasal Swab   Result Value Ref Range Status   MRSA by PCR Next Gen NOT DETECTED NOT DETECTED Final    Comment: (NOTE) The GeneXpert MRSA Assay (FDA approved for NASAL specimens only), is one component of a comprehensive MRSA colonization surveillance program. It is not intended to diagnose MRSA infection nor to guide or monitor treatment for MRSA infections. Test performance is not FDA approved in patients less than 16 years old. Performed at [facility], [address], [place], [place] [address]      Radiology Studies: No results found.  Scheduled Meds:  apixaban  5 mg Oral BID   aspirin EC  81 mg Oral Daily   bisoprolol  5 mg Oral Daily   budesonide (PULMICORT) nebulizer solution  0.5 mg Nebulization BID   chlorhexidine  15 mL Mouth Rinse BID   Chlorhexidine Gluconate Cloth  6 each Topical Daily   [START ON 01/23/2021] furosemide  80 mg Intravenous Daily   ipratropium-albuterol  3 mL Nebulization Q6H   isosorbide mononitrate  30 mg Oral Daily   levothyroxine  300 mcg Oral QAC breakfast   mouth rinse  15 mL Mouth Rinse q12n4p   methylPREDNISolone (SOLU-MEDROL) injection  80 mg Intravenous Q12H   simvastatin  5 mg Oral Daily   Continuous Infusions:   LOS: 2 days   Time spent: 35 mins   Johsua Shevlin Wynetta Emery, MD How to contact the [facility] Attending or Consulting provider [place] or covering provider during after hours [place], for this patient?  Check the care team in [facility] and look for a) attending/consulting TRH provider listed and b) the [facility] team listed Log into [email or web address] and use [place]'s universal password to access. If you do not have the password, please contact the hospital operator. Locate the [facility] provider you are looking for under [facility] and page to a number that you can be directly reached. If you still have difficulty reaching the provider, please page the [facility] (Director on Call) for the Hospitalists listed on amion for assistance.  01/22/2021, 2:19 PM

## 2021-01-22 NOTE — NC FL2 (Signed)
[place] LEVEL OF CARE SCREENING TOOL     IDENTIFICATION  Patient Name: [name] [name] Birthdate: June 01, 1946 Sex: male Admission Date (Current Location): 01/19/2021  [facility] and [place] Number:  [employer] and Address:  [place] [address], [place]      Provider Number: [id number]  Attending Physician Name and Address:  Murlean Iba, MD  Relative Name and Phone Number:       Current Level of Care: Hospital Recommended Level of Care: [place] Prior Approval Number:    Date Approved/Denied:   PASRR Number: [id number]  Discharge Plan: SNF    Current Diagnoses: Patient Active Problem List   Diagnosis Date Noted   CKD (chronic kidney disease) stage 4, GFR 15-29 ml/min ([place]) 01/21/2021   Pleural effusion 01/20/2021   Thrombocytopenia ([place]) 01/20/2021   Elevated d-dimer 01/20/2021   Atrial fibrillation, chronic ([place]) 01/20/2021   CHF exacerbation ([place]) 12/28/2020   CHF (congestive heart failure) ([place]) 12/28/2020   Acute on chronic diastolic HF (heart failure) ([place]) 10/06/2020   Chronic respiratory failure with hypoxia and hypercapnia ([place]) 09/10/2020   Former smoker 08/10/2020   COPD (chronic obstructive pulmonary disease) ([place]) 08/10/2020   Acute on chronic diastolic CHF (congestive heart failure) ([place]) 08/05/2020   Acute respiratory failure with hypoxia ([place]) 08/04/2020   Acute exacerbation of CHF (congestive heart failure) ([place]) 07/19/2020   AF (paroxysmal atrial fibrillation) ([place]) 07/19/2020   COVID-19    Acute on chronic respiratory failure with hypoxia and hypercapnia ([place]) 06/25/2020   Aspiration pneumonia ([place]) 06/24/2020   Pneumonia due to COVID-19 virus 06/24/2020   Confusion    Small bowel obstruction ([place]) 01/29/2020   SBO (small bowel obstruction) ([place]) 01/28/2020   Primary osteoarthritis of left knee 05/24/2018   Primary localized osteoarthritis of left knee 05/19/2018    Acute on chronic systolic and diastolic heart failure, NYHA class 1 ([place]) 04/01/2017   CAD (coronary artery disease) 04/01/2017   DOE (dyspnea on exertion) 03/21/2017   HTN (hypertension) 03/21/2017   NSTEMI (non-ST elevated myocardial infarction) ([place]) 01/19/2017   NSTEMI, initial episode of care ([place]) 01/19/2017   Sepsis ([place]) 07/24/2016   Elevated troponin 07/24/2016   Fever 07/24/2016   Lactic acidosis 07/24/2016   Adjustment insomnia 05/18/2016   Erectile dysfunction 12/19/2015   Sleep apnea 09/30/2015   Osteoarthritis 09/30/2015   Hypothyroidism 09/30/2015   Hypogonadism in male 09/30/2015   Diabetic neuropathy ([place]) 09/30/2015   Chronic pain of left knee 09/30/2015   Benign essential hypertension 09/30/2015   Acute on chronic renal failure ([place]) 05/14/2015   Diarrhea 05/14/2015   Dehydration 05/14/2015   Hypokalemia 05/14/2015   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[place]) 10/05/2014   Pelvic mass in male    Varices, esophageal (HCC)    Colonic mass    Abnormal CT scan, pelvis    Bladder mass    Elevated liver enzymes    Elevated LFTs    Abdominal pain 07/23/2014   Chronic kidney disease Stage IV 07/23/2014   Morbid obesity ([place]) 07/23/2014   Type 2 diabetes mellitus with stage 4 chronic kidney disease ([place]) 07/23/2014   Hypertension 07/23/2014   S/P total knee replacement, 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    Orientation RESPIRATION BLADDER Height & Weight     Self, Time, Situation, Place  O2 Continent Weight: (!) 321 lb 6.9 oz (145.8 kg) Height:  6\' 1"  (185.4 cm)  BEHAVIORAL SYMPTOMS/MOOD NEUROLOGICAL BOWEL NUTRITION STATUS      Continent  Diet (see dc summary)  AMBULATORY STATUS COMMUNICATION OF NEEDS Skin   Extensive Assist Verbally Normal                       Personal Care Assistance Level of Assistance    Bathing Assistance: Maximum assistance Feeding assistance: Independent Dressing Assistance: Limited assistance     Functional Limitations Info    Sight Info:  Adequate Hearing Info: Adequate Speech Info: Adequate    SPECIAL CARE FACTORS FREQUENCY  PT (By licensed PT), OT (By licensed OT)     PT Frequency: 5x week OT Frequency: 3x week            Contractures Contractures Info: Not present    Additional Factors Info    Code Status Info: Full Allergies Info: NKA           Current Medications (01/22/2021):  This is the current hospital active medication list Current Facility-Administered Medications  Medication Dose Route Frequency Provider Last Rate Last Admin   acetaminophen (TYLENOL) tablet 650 mg  650 mg Oral Q6H PRN Tat, Shanon Brow, MD       albuterol (VENTOLIN HFA) 108 (90 Base) MCG/ACT inhaler 1-2 puff  1-2 puff Inhalation Q6H PRN Adefeso, Oladapo, DO       apixaban (ELIQUIS) tablet 5 mg  5 mg Oral BID Adefeso, Oladapo, DO   5 mg at 01/21/21 2137   aspirin EC tablet 81 mg  81 mg Oral Daily Adefeso, Oladapo, DO   81 mg at 01/21/21 1100   bisoprolol (ZEBETA) tablet 5 mg  5 mg Oral Daily Adefeso, Oladapo, DO   5 mg at 01/21/21 1100   budesonide (PULMICORT) nebulizer solution 0.5 mg  0.5 mg Nebulization BID Tat, David, MD   0.5 mg at 01/22/21 0824   chlorhexidine (PERIDEX) 0.12 % solution 15 mL  15 mL Mouth Rinse BID Adefeso, Oladapo, DO   15 mL at 01/21/21 2137   Chlorhexidine Gluconate Cloth 2 % PADS 6 each  6 each Topical Daily Adefeso, Oladapo, DO   6 each at 01/21/21 1614   furosemide (LASIX) injection 80 mg  80 mg Intravenous Therisa Doyne, MD   80 mg at 01/22/21 0648   ipratropium-albuterol (DUONEB) 0.5-2.5 (3) MG/3ML nebulizer solution 3 mL  3 mL Inhalation Q4H PRN Adefeso, Oladapo, DO   3 mL at 01/21/21 1753   ipratropium-albuterol (DUONEB) 0.5-2.5 (3) MG/3ML nebulizer solution 3 mL  3 mL Nebulization Q6H Tat, Shanon Brow, MD   3 mL at 01/22/21 0807   isosorbide mononitrate (IMDUR) 24 hr tablet 30 mg  30 mg Oral Daily Tat, David, MD   30 mg at 01/21/21 1100   levothyroxine (SYNTHROID) tablet 300 mcg  300 mcg Oral QAC breakfast  Adefeso, Oladapo, DO   300 mcg at 01/22/21 6063   MEDLINE mouth rinse  15 mL Mouth Rinse q12n4p Adefeso, Oladapo, DO   15 mL at 01/20/21 1137   methylPREDNISolone sodium succinate (SOLU-MEDROL) 125 mg/2 mL injection 80 mg  80 mg Intravenous Q12H TatShanon Brow, MD   80 mg at 01/22/21 0200   nitroGLYCERIN (NITROSTAT) SL tablet 0.4 mg  0.4 mg Sublingual Q5 min PRN Johnson, Clanford L, MD       ondansetron (ZOFRAN) injection 4 mg  4 mg Intravenous Q6H PRN Tat, David, MD       senna-docusate (Senokot-S) tablet 1 tablet  1 tablet Oral Daily PRN Johnson, Clanford L, MD       simvastatin (ZOCOR) tablet 5  mg  5 mg Oral Daily Adefeso, Oladapo, DO   5 mg at 01/21/21 1100   sodium chloride (OCEAN) 0.65 % nasal spray 1 spray  1 spray Each Nare PRN Tat, Shanon Brow, MD         Discharge Medications: Please see discharge summary for a list of discharge medications.  Relevant Imaging Results:  Relevant Lab Results:   Additional Information SSN: [id number],

## 2021-01-22 NOTE — TOC Initial Note (Signed)
Transition of Care [facility]) - Initial/Assessment Note    Patient Details  Name: [name] [name] MRN: [id number] Date of Birth: Dec 16, 1945  [facility]) CM/SW Contact:    Shade Flood, LCSW Phone Number: 01/22/2021, 10:38 AM  Clinical Narrative:                  Pt admitted from home. PT recommending SNF rehab. Pt was recently discharged to [facility] for short term rehab though pt ended up leaving there AMA. Met with pt this AM to discuss SNF recommendation. Pt states he is agreeable if he gets accepted at one that he is willing to go to. Reviewed CMS provider options and will refer as requested. Pt will need insurance auth as well. MD anticipating dc tomorrow.  TOC will follow.  Expected Discharge Plan: Skilled Nursing Facility Barriers to Discharge: Continued Medical Work up   Patient Goals and CMS Choice Patient states their goals for this hospitalization and ongoing recovery are:: rehab CMS Medicare.gov Compare Post Acute Care list provided to:: Patient Choice offered to / list presented to : Patient  Expected Discharge Plan and Services Expected Discharge Plan: [place] In-house Referral: Clinical Social Work   Post Acute Care Choice: [place] Living arrangements for the past 2 months: [place]                                      Prior Living Arrangements/Services Living arrangements for the past 2 months: Single Family Home Lives with:: Self Patient language and need for interpreter reviewed:: Yes Do you feel safe going back to the place where you live?: Yes      Need for Family Participation in Patient Care: Yes (Comment) Care giver support system in place?: Yes (comment) Current home services: Home PT Criminal Activity/Legal Involvement Pertinent to Current Situation/Hospitalization: No - Comment as needed  Activities of Daily Living Home Assistive Devices/Equipment: Environmental consultant (specify type) (Rollator) ADL  Screening (condition at time of admission) Patient's cognitive ability adequate to safely complete daily activities?: Yes Is the patient deaf or have difficulty hearing?: No Does the patient have difficulty seeing, even when wearing glasses/contacts?: No Does the patient have difficulty concentrating, remembering, or making decisions?: No Patient able to express need for assistance with ADLs?: Yes Does the patient have difficulty dressing or bathing?: No Independently performs ADLs?: Yes (appropriate for developmental age) Does the patient have difficulty walking or climbing stairs?: Yes Weakness of Legs: Both Weakness of Arms/Hands: None  Permission Sought/Granted Permission sought to share information with : Chartered certified accountant granted to share information with : Yes, Verbal Permission Granted     Permission granted to share info w AGENCY: snfs        Emotional Assessment Appearance:: Appears stated age Attitude/Demeanor/Rapport: Engaged Affect (typically observed): Pleasant Orientation: : Oriented to Self, Oriented to Place, Oriented to  Time, Oriented to Situation Alcohol / Substance Use: Not Applicable Psych Involvement: No (comment)  Admission diagnosis:  Abnormal EKG [R94.31] Hypoxia [R09.02] Acute exacerbation of CHF (congestive heart failure) (HCC) [I50.9] Acute on chronic respiratory failure with hypercapnia (HCC) [J96.22] Acute on chronic congestive heart failure, unspecified heart failure type ([place]) [I50.9] Patient Active Problem List   Diagnosis Date Noted   CKD (chronic kidney disease) stage 4, GFR 15-29 ml/min ([place]) 01/21/2021   Pleural effusion 01/20/2021   Thrombocytopenia ([place]) 01/20/2021   Elevated d-dimer 01/20/2021  Atrial fibrillation, chronic ([place]) 01/20/2021   CHF exacerbation ([place]) 12/28/2020   CHF (congestive heart failure) ([place]) 12/28/2020   Acute on chronic diastolic HF (heart failure) ([place]) 10/06/2020   Chronic  respiratory failure with hypoxia and hypercapnia ([place]) 09/10/2020   Former smoker 08/10/2020   COPD (chronic obstructive pulmonary disease) ([place]) 08/10/2020   Acute on chronic diastolic CHF (congestive heart failure) ([place]) 08/05/2020   Acute respiratory failure with hypoxia ([place]) 08/04/2020   Acute exacerbation of CHF (congestive heart failure) ([place]) 07/19/2020   AF (paroxysmal atrial fibrillation) ([place]) 07/19/2020   COVID-19    Acute on chronic respiratory failure with hypoxia and hypercapnia (HCC) 06/25/2020   Aspiration pneumonia ([place]) 06/24/2020   Pneumonia due to COVID-19 virus 06/24/2020   Confusion    Small bowel obstruction ([place]) 01/29/2020   SBO (small bowel obstruction) ([place]) 01/28/2020   Primary osteoarthritis of left knee 05/24/2018   Primary localized osteoarthritis of left knee 05/19/2018   Acute on chronic systolic and diastolic heart failure, NYHA class 1 ([place]) 04/01/2017   CAD (coronary artery disease) 04/01/2017   DOE (dyspnea on exertion) 03/21/2017   HTN (hypertension) 03/21/2017   NSTEMI (non-ST elevated myocardial infarction) ([place]) 01/19/2017   NSTEMI, initial episode of care ([place]) 01/19/2017   Sepsis ([place]) 07/24/2016   Elevated troponin 07/24/2016   Fever 07/24/2016   Lactic acidosis 07/24/2016   Adjustment insomnia 05/18/2016   Erectile dysfunction 12/19/2015   Sleep apnea 09/30/2015   Osteoarthritis 09/30/2015   Hypothyroidism 09/30/2015   Hypogonadism in male 09/30/2015   Diabetic neuropathy ([place]) 09/30/2015   Chronic pain of left knee 09/30/2015   Benign essential hypertension 09/30/2015   Acute on chronic renal failure ([place]) 05/14/2015   Diarrhea 05/14/2015   Dehydration 05/14/2015   Hypokalemia 05/14/2015   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[place]) 10/05/2014   Pelvic mass in male    Varices, esophageal (HCC)    Colonic mass    Abnormal CT scan, pelvis    Bladder mass    Elevated liver enzymes    Elevated LFTs    Abdominal pain 07/23/2014   Chronic  kidney disease Stage IV 07/23/2014   Morbid obesity ([place]) 07/23/2014   Type 2 diabetes mellitus with stage 4 chronic kidney disease ([place]) 07/23/2014   Hypertension 07/23/2014   S/P total knee replacement, 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 [place] [address] Phone: [phone] Fax: [phone]     Social Determinants of Health (SDOH) Interventions    Readmission Risk Interventions Readmission Risk Prevention Plan 12/29/2020 10/07/2020 08/05/2020  Transportation Screening Complete Complete Complete  Medication Review Press photographer) Complete Complete Complete  PCP or Specialist appointment within 3-5 days of discharge Complete - -  HRI or Home Care Consult Complete Complete Complete  SW Recovery Care/Counseling Consult Complete Complete Complete  Palliative Care Screening Not Applicable Not Applicable Not Applicable  Skilled Nursing Facility Complete Not Applicable Not Applicable  Some recent data might be hidden

## 2021-01-22 NOTE — Progress Notes (Signed)
Physical Therapy Treatment Patient Details Name: [name] [name] MRN: [id number] DOB: [birth year] Today's Date: 01/22/2021    History of Present Illness 75 y.o. 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  The patient has had numerous hospitalizations with similar presentations.  Most recently, the patient was admitted to the hospital from 12/28/2020 to 01/02/2021 for acute on chronic diastolic CHF.  He was discharged to a skilled nursing facility with furosemide 40 mg daily.  Prior to that hospitalization, the patient was admitted from 10/06/2020 to 10/11/2020 once again for respiratory failure secondary to CHF exacerbation.  His discharge weight at that time was 132.4 kg.  It appears he was discharged home with furosemide 80 mg once daily at that time.  Once again, he was admitted from 08/04/2020 to 08/10/2020 for respiratory failure secondary to acute on chronic diastolic CHF.  He was discharged home on furosemide 80 mg once daily during the hospitalization.    PT Comments    Pt amenable to PT participation and initiated tx with long sitting and ther ex to improve BLE strength and activity tolerance. Mini-squats performed in upright positiong with RW for support x 5 reps and vitals maintained WNL and O2 sat 95%.  Continued with gait training in room with CGA and use of bariatric RW and around the 20 ft mark increase in SOB and exertion reported and O2 sat measure of 67%.  Pt returned to sitting and cues for pursed-lip breathing with recovery to 92% after 3 minutes of paced breathing activity.  Tx  ended and pt in no apparent distress.  Call bell placed on person   Follow Up Recommendations  SNF;Supervision for mobility/OOB;Supervision - Intermittent     Equipment Recommendations  None recommended by PT    Recommendations for Other Services       Precautions / Restrictions      Mobility  Bed Mobility                 Patient Response: Cooperative  Transfers Overall transfer level: Needs assistance Equipment used: Rolling walker (2 wheeled) Transfers: Sit to/from [employer] Transfers Sit to Stand: Min guard Stand pivot transfers: Min guard       General transfer comment: slow labored movement  Ambulation/Gait Ambulation/Gait assistance: Min guard Gait Distance (Feet): 30 Feet Assistive device: Rolling walker (2 wheeled) Gait Pattern/deviations: Decreased step length - right;Decreased step length - left;Decreased stride length Gait velocity: decreased   General Gait Details: slow labored cadence without loss of balance, limited mostly due to SOB with SpO2 dropping from 95% to 67% with exertion   Stairs             Wheelchair Mobility    Modified Rankin (Stroke Patients Only)       Balance                                            Cognition  Exercises General Exercises - Lower Extremity Ankle Circles/Pumps: AROM;Both;20 reps Quad Sets: Strengthening;Both;20 reps Long Arc Quad: Strengthening;Both;20 reps Mini-Sqauts: Strengthening;Both;5 reps    General Comments        Pertinent Vitals/Pain Pain Assessment: No/denies pain Pain Score: 0-No pain    Home Living                      Prior Function            PT Goals (current goals can now be found in the care plan section) Acute Rehab PT Goals Patient Stated Goal: return home PT Goal Formulation: With patient Time For Goal Achievement: 02/03/21 Potential to Achieve Goals:  Good Progress towards PT goals: Progressing toward goals    Frequency    Min 3X/week      PT Plan      Co-evaluation              AM-PAC PT "6 Clicks" Mobility   Outcome Measure  Help needed turning from your back to your side while in a flat bed without using bedrails?: None Help needed moving from lying on your back to sitting on the side of a flat bed without using bedrails?: None Help needed moving to and from a bed to a chair (including a wheelchair)?: A Little Help needed standing up from a chair using your arms (e.g., wheelchair or bedside chair)?: A Little Help needed to walk in hospital room?: A Little Help needed climbing 3-5 steps with a railing? : A Lot 6 Click Score: 19    End of Session Equipment Utilized During Treatment: Oxygen Activity Tolerance: Patient tolerated treatment well;Patient limited by fatigue Patient left: in chair;with call bell/phone within reach Nurse Communication: Mobility status PT Visit Diagnosis: Unsteadiness on feet (R26.81);Other abnormalities of gait and mobility (R26.89);Muscle weakness (generalized) (M62.81)     Time: 3329-5188 PT Time Calculation (min) (ACUTE ONLY): 25 min  Charges:  $Gait Training: 8-22 mins $Therapeutic Exercise: 8-22 mins                     4:41 PM, 01/22/21 M. Sherlyn Lees, PT, DPT Physical Therapist- [place] Office Number: [phone]

## 2021-01-23 ENCOUNTER — Ambulatory Visit (HOSPITAL_COMMUNITY): Payer: Medicare HMO | Admitting: Hematology

## 2021-01-23 ENCOUNTER — Inpatient Hospital Stay (HOSPITAL_COMMUNITY): Payer: Medicare HMO

## 2021-01-23 DIAGNOSIS — J9622 Acute and chronic respiratory failure with hypercapnia: Secondary | ICD-10-CM

## 2021-01-23 LAB — RESP PANEL BY RT-PCR (FLU A&B, COVID) ARPGX2
Influenza A by PCR: NEGATIVE
Influenza B by PCR: NEGATIVE
SARS Coronavirus 2 by RT PCR: NEGATIVE

## 2021-01-23 LAB — BASIC METABOLIC PANEL
Anion gap: 11 (ref 5–15)
BUN: 77 mg/dL — ABNORMAL HIGH (ref 8–23)
CO2: 30 mmol/L (ref 22–32)
Calcium: 8.7 mg/dL — ABNORMAL LOW (ref 8.9–10.3)
Chloride: 96 mmol/L — ABNORMAL LOW (ref 98–111)
Creatinine, Ser: 3.04 mg/dL — ABNORMAL HIGH (ref 0.61–1.24)
GFR, Estimated: 21 mL/min — ABNORMAL LOW (ref >60)
Glucose, Bld: 150 mg/dL — ABNORMAL HIGH (ref 70–99)
Potassium: 4.8 mmol/L (ref 3.5–5.1)
Sodium: 137 mmol/L (ref 135–145)

## 2021-01-23 MED ORDER — MICONAZOLE NITRATE POWD: Freq: Two times a day (BID) | Status: DC

## 2021-01-23 MED ORDER — FUROSEMIDE 40 MG PO TABS
40.0000 mg | ORAL_TABLET | Freq: Two times a day (BID) | ORAL | Status: DC
  Administered 2021-01-24: 40 mg via ORAL
  Filled 2021-01-23: qty 1

## 2021-01-23 NOTE — Progress Notes (Signed)
Palliative: [name] [name], [place], is sitting up in the [place] chair in his room eating breakfast.  He greets me, making and mostly keeping eye contact.  He is alert and oriented, able to make his basic needs known.  There is no family at bedside at this time.    We talked about his acute on chronic heart failure.  He tells me that overall he felt okay, but he was having trouble breathing.  We talked about short-term rehab, [name] [name] continues to be in agreement for short-term rehab, but has set limits on which facility he would accept.  We talked about outpatient palliative services.  [name] [name] continues to be agreeable to outpatient palliative services for goalsetting/CODE STATUS discussions.  [name] [name] remains agreeable to transfer to short-term rehab.  He is agreeable to outpatient palliative services to continue goals of care/CODE STATUS discussions with both he and his daughter.  [name] [name] tells me that he was scheduled to see his primary care doctor today for a COVID-vaccine.  He request this to be done in hospital if possible.  Conference with attending, bedside nursing staff, transition of care team related to patient condition, needs, goals of care, disposition, outpatient palliative services, request for COVID vacs.  Plan: Continue to treat the treatable.  Short-term rehab with ultimate goal to return home.  Agreeable to outpatient palliative services for goals of care/CODE STATUS discussion with patient and daughter. [name] [name] is requesting COVID-vaccine/booster while hospitalized, if possible.  Nursing staff notified.  58 minutes Quinn Axe, NP Palliative medicine team Team phone [phone] Greater than 50% of this time was spent counseling and coordinating care related to the above assessment and plan.

## 2021-01-23 NOTE — Final Progress Note (Signed)
Patient a/o x 4. Refusing to keep telemetry on. Getting up to restroom multiple times. Education provided.

## 2021-01-23 NOTE — Progress Notes (Signed)
PROGRESS NOTE   [name] [name]  PQZ:[id number] DOB: 02/24/1946 DOA: 01/19/2021 PCP: Nickola Major, MD   Chief Complaint  Patient presents with   Shortness of Breath   Level of care: Stepdown  Brief Admission History:  75 y.o. 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  The patient has had numerous hospitalizations with similar presentations.  Assessment & Plan:   Active Problems:   Chronic kidney disease Stage IV   Morbid obesity (HCC)   Type 2 diabetes mellitus with stage 4 chronic kidney disease (HCC)   Hypertension   Elevated troponin   Lactic acidosis   Hypothyroidism   Acute on chronic respiratory failure with hypoxia and hypercapnia (HCC)   Acute on chronic diastolic CHF (congestive heart failure) (HCC)   Pleural effusion   Thrombocytopenia (HCC)   Elevated d-dimer   Atrial fibrillation, chronic (HCC)   CKD (chronic kidney disease) stage 4, GFR 15-29 ml/min (HCC)  Acute on chronic respiratory failure with hypercarbia and hypoxia - Secondary to CHF exacerbation being treated by diuresis and bipap therapy.  Wean bipap as able.    Acute HFpEF - he has been given IV lasix for diuresis, hold today with diuretic holiday given bump in creatinine.  Plan to restart home oral lasix starting 6/24.     COPD with exacerbation - continue IV steroids and bronchodilators as ordered.    Stage IV CKD - baseline 2.5-3, follow with diuresis  Chronic atrial fibrillation - rate is controlled with bisoprolol and fully anticoagulated with apixaban.   OSA - pt needs sleep  study, plan to refer to sleep center for ambulatory referral at discharge.   CAD - no CP symptoms, resumed home aspirin, imdur, zocor  Essential hypertension - resume home bisoprolol   Thrombocytopenia - platelet count normalized up to 174 today.   DVT prophylaxis: apixaban  Code Status: full  Family Communication: discussed with patient at bedside, verbalized understanding Disposition: home  Status is: Inpatient  Not inpatient appropriate, will call UM team and downgrade to OBS.   Dispo: The patient is from: Home              Anticipated d/c is to: Home              Patient currently is not medically stable to d/c.   Difficult to place patient No   Consultants:  N/a   Procedures:  N/a  Antimicrobials:  N/a    Subjective: Pt reports that he felt better when he is using the nightly bipap therapy.   No CP and no SOB.    Objective: Vitals:   01/23/21 0745 01/23/21 0843 01/23/21 1206 01/23/21 1406  BP:      Pulse: 76  84   Resp: 18  (!) 22   Temp: 98 F (36.7 C)  (!) 97.4 F (36.3 C)   TempSrc:   Oral   SpO2: 96% 95% (!) 86% 95%  Weight:      Height:        Intake/Output Summary (Last 24 hours) at 01/23/2021 1455 Last data filed at 01/23/2021 0700 Gross per 24 hour  Intake --  Output 975 ml  Net -  975 ml   Filed Weights   01/19/21 1929 01/21/21 0456 01/22/21 0510  Weight: (!) 142.9 kg (!) 142.7 kg (!) 145.8 kg    Examination:  General exam: Appears calm and comfortable  Respiratory system: Clear to auscultation. Respiratory effort normal. Cardiovascular system: normal S1 & S2 heard. No JVD, murmurs, rubs, gallops or clicks. No pedal edema. Gastrointestinal system: Abdomen is nondistended, soft and nontender. No organomegaly or masses felt. Normal bowel sounds heard. Central nervous system: Alert and oriented. No focal neurological deficits. Extremities: Symmetric 5 x 5 power. Skin: No rashes, lesions or ulcers Psychiatry: Judgement and insight appear  normal. Mood & affect appropriate.   Data Reviewed: I have personally reviewed following labs and imaging studies  CBC: Recent Labs  Lab 01/19/21 1930 01/20/21 0636 01/21/21 0715 01/22/21 0347  WBC 6.5 6.2 5.9 6.5  HGB 14.1 14.0 14.2 13.9  HCT 47.3 47.4 47.0 45.5  MCV 94.4 94.6 94.0 92.3  PLT 126* 121* 132* 956    Basic Metabolic Panel: Recent Labs  Lab 01/19/21 1930 01/20/21 0636 01/21/21 0715 01/22/21 0347 01/23/21 0528  NA 142 143 139 138 137  K 3.5 3.5 5.5* 4.4 4.8  CL 99 101 99 98 96*  CO2 33* 32 28 31 30   GLUCOSE 118* 76 137* 130* 150*  BUN 41* 38* 48* 64* 77*  CREATININE 2.44* 2.28* 2.58* 2.91* 3.04*  CALCIUM 8.7* 8.7* 8.6* 8.9 8.7*  MG  --  2.4 2.5* 2.5*  --   PHOS  --  4.1  --   --   --     GFR: Estimated Creatinine Clearance: 32.5 mL/min (A) (by C-G formula based on SCr of 3.04 mg/dL (H)).  Liver Function Tests: Recent Labs  Lab 01/19/21 1930 01/20/21 0636  AST 11* 12*  ALT 11 11  ALKPHOS 106 89  BILITOT 0.6 0.8  PROT 7.3 6.8  ALBUMIN 3.9 3.6    CBG: No results for input(s): GLUCAP in the last 168 hours.  Recent Results (from the past 240 hour(s))  Resp Panel by RT-PCR (Flu A&B, Covid) Nasopharyngeal Swab     Status: None   Collection Time: 01/19/21  7:30 PM   Specimen: Nasopharyngeal Swab; Nasopharyngeal(NP) swabs in vial transport medium  Result Value Ref Range Status   SARS Coronavirus 2 by RT PCR NEGATIVE NEGATIVE Final    Comment: (NOTE) SARS-CoV-2 target nucleic acids are NOT DETECTED.  The SARS-CoV-2 RNA is generally detectable in upper respiratory specimens during the acute phase of infection. The lowest concentration of SARS-CoV-2 viral copies this assay can detect is 138 copies/mL. A negative result does not preclude SARS-Cov-2 infection and should not be used as the sole basis for treatment or other patient management decisions. A negative result may occur with  improper specimen collection/handling, submission of specimen  other than nasopharyngeal swab, presence of viral mutation(s) within the areas targeted by this assay, and inadequate number of viral copies(<138 copies/mL). A negative result must be combined with clinical observations, patient history, and epidemiological information. The expected result is Negative.  Fact Sheet for Patients:  EntrepreneurPulse.com.au  Fact Sheet for Healthcare Providers:  IncredibleEmployment.be  This test is no t yet approved or cleared by the [place] FDA and  has been authorized for detection and/or diagnosis of SARS-CoV-2 by FDA under an Emergency Use Authorization (EUA). This EUA will remain  in effect (meaning this test can be used) for the duration of the COVID-19 declaration under Section 564(b)(1) of the Act, 21 U.S.C.section  360bbb-3(b)(1), unless the authorization is terminated  or revoked sooner.       Influenza A by PCR NEGATIVE NEGATIVE Final   Influenza B by PCR NEGATIVE NEGATIVE Final    Comment: (NOTE) The Xpert Xpress SARS-CoV-2/FLU/RSV plus assay is intended as an aid in the diagnosis of influenza from Nasopharyngeal swab specimens and should not be used as a sole basis for treatment. Nasal washings and aspirates are unacceptable for Xpert Xpress SARS-CoV-2/FLU/RSV testing.  Fact Sheet for Patients: EntrepreneurPulse.com.au  Fact Sheet for Healthcare Providers: IncredibleEmployment.be  This test is not yet approved or cleared by the [place] FDA and has been authorized for detection and/or diagnosis of SARS-CoV-2 by FDA under an Emergency Use Authorization (EUA). This EUA will remain in effect (meaning this test can be used) for the duration of the COVID-19 declaration under Section 564(b)(1) of the Act, 21 U.S.C. section 360bbb-3(b)(1), unless the authorization is terminated or revoked.  Performed at [facility], [address], [place], [place]  [address]   MRSA Next Gen by PCR, Nasal     Status: None   Collection Time: 01/20/21  5:00 AM   Specimen: Nasal Mucosa; Nasal Swab  Result Value Ref Range Status   MRSA by PCR Next Gen NOT DETECTED NOT DETECTED Final    Comment: (NOTE) The GeneXpert MRSA Assay (FDA approved for NASAL specimens only), is one component of a comprehensive MRSA colonization surveillance program. It is not intended to diagnose MRSA infection nor to guide or monitor treatment for MRSA infections. Test performance is not FDA approved in patients less than 58 years old. Performed at [facility], [address], [place], [place] [address]      Radiology Studies: No results found.  Scheduled Meds:  apixaban  5 mg Oral BID   aspirin EC  81 mg Oral Daily   bisoprolol  5 mg Oral Daily   budesonide (PULMICORT) nebulizer solution  0.5 mg Nebulization BID   chlorhexidine  15 mL Mouth Rinse BID   Chlorhexidine Gluconate Cloth  6 each Topical Daily   [START ON 01/24/2021] furosemide  40 mg Oral BID   ipratropium-albuterol  3 mL Nebulization Q6H   isosorbide mononitrate  30 mg Oral Daily   levothyroxine  300 mcg Oral QAC breakfast   mouth rinse  15 mL Mouth Rinse q12n4p   methylPREDNISolone (SOLU-MEDROL) injection  80 mg Intravenous Q12H   miconazole nitrate   Topical BID   simvastatin  5 mg Oral Daily   Continuous Infusions:   LOS: 3 days   Time spent: 35 mins   Jonathon Tan Wynetta Emery, MD How to contact the [facility] Attending or Consulting provider [place] or covering provider during after hours [place], for this patient?  Check the care team in [facility] and look for a) attending/consulting TRH provider listed and b) the [facility] team listed Log into [email or web address] and use [place]'s universal password to access. If you do not have the password, please contact the hospital operator. Locate the [facility] provider you are looking for under [facility] and page to a number that you can be directly reached. If you still have  difficulty reaching the provider, please page the [facility] (Director on Call) for the Hospitalists listed on amion for assistance.  01/23/2021, 2:55 PM

## 2021-01-24 DIAGNOSIS — E039 Hypothyroidism, unspecified: Secondary | ICD-10-CM

## 2021-01-24 MED ORDER — FUROSEMIDE 40 MG PO TABS: 40.0000 mg | ORAL_TABLET | Freq: Two times a day (BID) | ORAL | 1 refills | Status: DC

## 2021-01-24 MED ORDER — INSULIN DEGLUDEC 100 UNIT/ML ~~LOC~~ SOPN
10.0000 [IU] | PEN_INJECTOR | Freq: Every day | SUBCUTANEOUS | Status: DC
Start: 2021-01-24 — End: 2021-06-26

## 2021-01-24 MED ORDER — PREDNISONE 20 MG PO TABS: ORAL_TABLET | ORAL | 0 refills | Status: DC

## 2021-01-24 MED ORDER — POTASSIUM CHLORIDE CRYS ER 10 MEQ PO TBCR: 10.0000 meq | EXTENDED_RELEASE_TABLET | Freq: Every day | ORAL | 0 refills | Status: DC

## 2021-01-24 NOTE — TOC Transition Note (Addendum)
Transition of Care [facility]) - CM/SW Discharge Note   Patient Details  Name: [name] [name] MRN: [id number] Date of Birth: [birth year]  Transition [facility] CM/SW Contact:  Shade Flood, LCSW Phone Number: 01/24/2021, 11:09 AM   Clinical Narrative:     Received return call from [facility] stating that they cannot see pt in timely manner. Discussed with pt who is agreeable to referral to [facility]. Spoke with Tommi Rumps at [facility] and they can accept referral and see in timely manner. Also referred pt for outpatient Palliative with [facility].  No other needs.  Final next level of care: [place] Barriers to Discharge: Barriers Resolved   Patient Goals and CMS Choice Patient states their goals for this hospitalization and ongoing recovery are:: rehab CMS Medicare.gov Compare Post Acute Care list provided to:: Patient Choice offered to / list presented to : Patient  Discharge Placement                       Discharge Plan and Services In-house Referral: Clinical Social Work   Post Acute Care Choice: [place]                    HH Arranged: RN, PT [facility] Agency: [place] Date [place]: 01/24/21   Representative spoke with at [place]: [place] ([place]) Interventions     Readmission Risk Interventions Readmission Risk Prevention Plan 12/29/2020 10/07/2020 08/05/2020  Transportation Screening Complete Complete Complete  Medication Review Press photographer) Complete Complete Complete  PCP or Specialist appointment within 3-5 days of discharge Complete - -  HRI or Home Care Consult Complete Complete Complete  SW Recovery Care/Counseling Consult Complete Complete Complete  Palliative Care Screening Not Applicable Not Applicable Not Applicable  Skilled Nursing Facility Complete Not Applicable Not Applicable  Some recent data might be hidden

## 2021-01-24 NOTE — Progress Notes (Signed)
Patient up in chair, telemetry on with HR sinus 75. Vitals stable. Patient denies SOB. Spo2 95% on 4L via [place].   Discussed with patient plan for Discharge today. Patient arranging for sister to pick up for transport this afternoon. Home health recommended. Patient states he does not want to go to Rehab.

## 2021-01-24 NOTE — Progress Notes (Signed)
Discharge instructions reviewed with patient. Education on CHF and Respiratory failure provided. Patient verbalized understanding of medications and instructions for administration.   All personal belongings accounted for. Waiting for discharge ride from sister.   Patient to follow up with pcp and home health care ordered.

## 2021-01-24 NOTE — Discharge Summary (Signed)
Physician Discharge Summary  [name] [name] YHC:[id number] DOB: 1946/07/11 DOA: 01/19/2021  PCP: Nickola Major, MD  Admit date: 01/19/2021 Discharge date: 01/24/2021  Admitted From:  Home Disposition: Home with [place] (Pt has declined SNF placement)  Recommendations for Outpatient Follow-up:  Follow up with PCP in 1 weeks Follow up with cardiology in 2 weeks Establish care with Dr. Theador Hawthorne for chronic kidney disease in 3 weeks Ambulatory referral made for palliative care Ambulatory referral made for sleep study Ambulatory referral to pulmonary clinic  Please obtain BMP in 2 weeks to follow up renal function and electrolytes   Discharge Condition: STABLE   CODE STATUS: FULL DIET: heart healthy carb modified    Brief Hospitalization Summary: Please see all hospital notes, images, labs for full details of the hospitalization. ADMISSION HPI: [name] [name] is a 75 y.o. male with medical history significant for  type 2 diabetes mellitus, .Class III  obesity, coronary artery disease, non-Hodgkin's lymphoma, hypothyroidism, hyperlipidemia, essential hypertension, CKD stage IV, atrial fibrillation, OSA, chronic respiratory failure on supplemental oxygen via [place] at 3 L/min who presents to the emergency department due to worsening shortness of breath.  He states that he could barely walk 10 to 15 feet without being short of breath. Patient states that he had COVID-19 virus in November of last year and that he has had persistent shortness of breath since then.  He was recently seen in the ED on 6/14 due to acute on chronic CHF and COPD exacerbation during which he was treated with IV Lasix 40 mg and breathing treatment with DuoNeb was provided and patient was discharged home.,  He was recently admitted from 5/28-6/2 due to acute respiratory failure with hypoxia secondary to CHF exacerbation  after which he was discharged to a rehab facility, but patient left the facility because "he did not like it".  He  complained of chronic leg swelling.  Of note, when patient was seen in the ED on 6/14, he was asked to increase his Lasix dose, however, patient appears not to have increased this.  He denies chest pain, fever, chills, nausea or vomiting.   ED Course:  In the emergency department, he was hemodynamically stable.  BP was 118/76 at bedside.  Work-up in the ED showed normal CBC except for thrombocytopenia, BUN/creatinine 41/2.44 (creatinine within baseline range).  Lactic acid 2.0, D-dimer 2.44, BNP 163 (chronically elevated), troponin x2- 41 > 42, influenza A, B, SARS coronavirus 2 was negative.  Chest x-ray showed cardiomegaly with pulmonary edema and small bilateral pleural effusions with findings similar to prior exam.  IV Lasix 80 mg x 1 was given.  Hospitalist was asked to admit patient for further evaluation and management.     HOSPITAL COURSE BY PROBLEM    Acute on chronic respiratory failure with hypercarbia and hypoxia - Secondary to CHF exacerbation being treated by diuresis and bipap therapy.  HE HAS BEEN STABLE OFF BIPAP.  WE ARE REFERRING FOR OUTPATIENT SLEEP STUDY AS HE NEEDS CPAP DEVICE.  WE ARE REFERRING HIM TO OUTPATIENT PULMONARY CLINIC FOR FURTHER EVALUATION.       Acute HFpEF - he has been given IV lasix for diuresis, hold today with diuretic holiday given bump in creatinine.  Plan to restart home oral lasix starting 6/24.      COPD with exacerbation - TREATED WITH IV steroids and bronchodilators as ordered.  DC HOME WITH PREDNISONE TAPER.     Stage IV CKD - baseline 2.5-3, follow with diuresis. RECHECK BMP  IN 1-2 WEEKS.  PT ADVISED TO CALL AND ESTABLISH CARE WITH NEPHROLOGIST DR. Theador Hawthorne IN [place] FOR CKD MANAGEMENT.    Chronic atrial fibrillation - rate is controlled with bisoprolol and fully anticoagulated with apixaban.   OSA - pt needs sleep study, plan to refer to sleep center for ambulatory referral at discharge.   CAD - no CP symptoms, resumed home aspirin, imdur,  zocor   Essential hypertension - resume home bisoprolol   Thrombocytopenia - platelet count normalized up to 174 today.   DVT prophylaxis: apixaban Code Status: full Family Communication: discussed with patient at bedside, verbalized understanding Disposition: home Status is: Inpatient  Discharge Diagnoses:  Active Problems:   Chronic kidney disease Stage IV   Morbid obesity (HCC)   Type 2 diabetes mellitus with stage 4 chronic kidney disease (HCC)   Hypertension   Elevated troponin   Lactic acidosis   Hypothyroidism   Acute on chronic respiratory failure with hypoxia and hypercapnia (HCC)   Acute on chronic diastolic CHF (congestive heart failure) (HCC)   Pleural effusion   Thrombocytopenia (HCC)   Elevated d-dimer   Atrial fibrillation, chronic (HCC)   CKD (chronic kidney disease) stage 4, GFR 15-29 ml/min (HCC)   Acute on chronic respiratory failure with hypercapnia [facility])   Discharge Instructions: Discharge Instructions     Amb Referral to Palliative Care   Complete by: As directed    Ambulatory referral to Pulmonology   Complete by: As directed    Reason for referral: Asthma/COPD   Ambulatory referral to Sleep Studies   Complete by: As directed       Allergies as of 01/24/2021   No Known Allergies      Medication List     STOP taking these medications    amLODipine 10 MG tablet Commonly known as: NORVASC   fluticasone 50 MCG/ACT nasal spray Commonly known as: FLONASE   methylPREDNISolone 4 MG Tbpk tablet Commonly known as: MEDROL DOSEPAK       TAKE these medications    acetaminophen 325 MG tablet Commonly known as: TYLENOL Take 2 tablets (650 mg total) by mouth every 6 (six) hours as needed for mild pain (or Fever >/= 101).   albuterol 108 (90 Base) MCG/ACT inhaler Commonly known as: VENTOLIN HFA Inhale 1-2 puffs into the lungs every 6 (six) hours as needed for wheezing or shortness of breath.   aspirin EC 81 MG tablet Take 81 mg by  mouth daily. Swallow whole.   bisoprolol 5 MG tablet Commonly known as: ZEBETA Take 5 mg by mouth daily.   chlorhexidine 0.12 % solution Commonly known as: PERIDEX 15 mLs by Mouth Rinse route 2 (two) times daily.   Eliquis 5 MG Tabs tablet Generic drug: apixaban Take 1 tablet (5 mg total) by mouth 2 (two) times daily. This is a dose change   furosemide 40 MG tablet Commonly known as: LASIX Take 1 tablet (40 mg total) by mouth 2 (two) times daily. What changed:  medication strength when to take this   insulin degludec 100 UNIT/ML FlexTouch Pen Commonly known as: TRESIBA Inject 10 Units into the skin daily at 10 pm. What changed: how much to take   ipratropium-albuterol 0.5-2.5 (3) MG/3ML Soln Commonly known as: DUONEB Inhale 3 mLs into the lungs every 4 (four) hours as needed (shortness of breath).   isosorbide mononitrate 30 MG 24 hr tablet Commonly known as: IMDUR Take 1 tablet (30 mg total) by mouth daily.   levocetirizine 5 MG tablet  Commonly known as: XYZAL Take 5 mg by mouth every evening.   levothyroxine 200 MCG tablet Commonly known as: SYNTHROID Take 300 mcg by mouth daily before breakfast. (takes with 115mcg tab for a total of 338mcg)   levothyroxine 100 MCG tablet Commonly known as: SYNTHROID Take 100 mcg by mouth daily before breakfast. (Takes with 200 mcg tab for a total of 300 mcg once daily)   mouth rinse Liqd solution 15 mLs by Mouth Rinse route 2 times daily at 12 noon and 4 pm.   nitroGLYCERIN 0.4 MG SL tablet Commonly known as: NITROSTAT Place 0.4 mg under the tongue every 5 (five) minutes as needed for chest pain.   OXYGEN Inhale 3-4 L into the lungs daily as needed (low oxygen).   potassium chloride 10 MEQ tablet Commonly known as: KLOR-CON Take 1 tablet (10 mEq total) by mouth daily. What changed:  medication strength how much to take when to take this   predniSONE 20 MG tablet Commonly known as: DELTASONE Take 3 PO QAM x3days, 2  PO QAM x3days, 1 PO QAM x3days   senna-docusate 8.6-50 MG tablet Commonly known as: Senokot-S Take 1 tablet by mouth daily as needed for mild constipation.   simvastatin 5 MG tablet Commonly known as: ZOCOR Take 5 mg by mouth daily.   Symbicort 160-4.5 MCG/ACT inhaler Generic drug: budesonide-formoterol Inhale 2 puffs into the lungs daily as needed (shortness of breath).        Follow-up Information     [facility], [facility] Follow up.   Specialty: Home Health Services Why: Home Health staff will call to schedule visits Contact information: [place] [address] [phone]         Nickola Major, MD. Schedule an appointment as soon as possible for a visit in 1 week(s).   Specialty: Family Medicine Why: Hospital Follow Up Contact information: [address] [place] [address] [phone]         Satira Sark, MD. Schedule an appointment as soon as possible for a visit in 2 week(s).   Specialty: Cardiology Why: Hospital Follow Up Contact information: [place] [address] [phone]         Liana Gerold, MD. Schedule an appointment as soon as possible for a visit in 3 week(s).   Specialty: Nephrology Why: Establish care for chronic kidney disease Contact information: [address] [address] [phone]                No Known Allergies Allergies as of 01/24/2021   No Known Allergies      Medication List     STOP taking these medications    amLODipine 10 MG tablet Commonly known as: NORVASC   fluticasone 50 MCG/ACT nasal spray Commonly known as: FLONASE   methylPREDNISolone 4 MG Tbpk tablet Commonly known as: MEDROL DOSEPAK       TAKE these medications    acetaminophen 325 MG tablet Commonly known as: TYLENOL Take 2 tablets (650 mg total) by mouth every 6 (six) hours as needed for mild pain (or Fever >/= 101).   albuterol 108  (90 Base) MCG/ACT inhaler Commonly known as: VENTOLIN HFA Inhale 1-2 puffs into the lungs every 6 (six) hours as needed for wheezing or shortness of breath.   aspirin EC 81 MG tablet Take 81 mg by mouth daily. Swallow whole.   bisoprolol 5 MG tablet Commonly known as: ZEBETA Take 5  mg by mouth daily.   chlorhexidine 0.12 % solution Commonly known as: PERIDEX 15 mLs by Mouth Rinse route 2 (two) times daily.   Eliquis 5 MG Tabs tablet Generic drug: apixaban Take 1 tablet (5 mg total) by mouth 2 (two) times daily. This is a dose change   furosemide 40 MG tablet Commonly known as: LASIX Take 1 tablet (40 mg total) by mouth 2 (two) times daily. What changed:  medication strength when to take this   insulin degludec 100 UNIT/ML FlexTouch Pen Commonly known as: TRESIBA Inject 10 Units into the skin daily at 10 pm. What changed: how much to take   ipratropium-albuterol 0.5-2.5 (3) MG/3ML Soln Commonly known as: DUONEB Inhale 3 mLs into the lungs every 4 (four) hours as needed (shortness of breath).   isosorbide mononitrate 30 MG 24 hr tablet Commonly known as: IMDUR Take 1 tablet (30 mg total) by mouth daily.   levocetirizine 5 MG tablet Commonly known as: XYZAL Take 5 mg by mouth every evening.   levothyroxine 200 MCG tablet Commonly known as: SYNTHROID Take 300 mcg by mouth daily before breakfast. (takes with 1100mcg tab for a total of 346mcg)   levothyroxine 100 MCG tablet Commonly known as: SYNTHROID Take 100 mcg by mouth daily before breakfast. (Takes with 200 mcg tab for a total of 300 mcg once daily)   mouth rinse Liqd solution 15 mLs by Mouth Rinse route 2 times daily at 12 noon and 4 pm.   nitroGLYCERIN 0.4 MG SL tablet Commonly known as: NITROSTAT Place 0.4 mg under the tongue every 5 (five) minutes as needed for chest pain.   OXYGEN Inhale 3-4 L into the lungs daily as needed (low oxygen).   potassium chloride 10 MEQ tablet Commonly known as:  KLOR-CON Take 1 tablet (10 mEq total) by mouth daily. What changed:  medication strength how much to take when to take this   predniSONE 20 MG tablet Commonly known as: DELTASONE Take 3 PO QAM x3days, 2 PO QAM x3days, 1 PO QAM x3days   senna-docusate 8.6-50 MG tablet Commonly known as: Senokot-S Take 1 tablet by mouth daily as needed for mild constipation.   simvastatin 5 MG tablet Commonly known as: ZOCOR Take 5 mg by mouth daily.   Symbicort 160-4.5 MCG/ACT inhaler Generic drug: budesonide-formoterol Inhale 2 puffs into the lungs daily as needed (shortness of breath).        Procedures/Studies: NM Pulmonary Perfusion  Result Date: 01/20/2021 CLINICAL DATA:  PE suspected, low intermediate probability, positive D-dimer, shortness of breath and chest pain for 6 months EXAM: NUCLEAR MEDICINE PERFUSION LUNG SCAN TECHNIQUE: Perfusion images were obtained in multiple projections after intravenous injection of radiopharmaceutical. Ventilation scans intentionally deferred if perfusion scan and chest x-ray adequate for interpretation during COVID 19 epidemic. RADIOPHARMACEUTICALS:  4.0 mCi Tc-74m MAA IV COMPARISON:  Chest radiographs, 01/19/2021 FINDINGS: Homogeneous bilateral pulmonary perfusion without suspicious perfusion defect. Cardiomegaly. IMPRESSION: Very low probability examination for pulmonary embolism by modified perfusion only PIOPED criteria (PE absent). Electronically Signed   By: Eddie Candle M.D.   On: 01/20/2021 11:38   DG Chest Portable 1 View  Result Date: 01/19/2021 CLINICAL DATA:  Shortness of breath EXAM: PORTABLE CHEST 1 VIEW COMPARISON:  01/14/2021 FINDINGS: Stable cardiomegaly. Low lung volumes. Pulmonary vascular congestion with diffuse interstitial prominence. Small bilateral pleural effusions. No pneumothorax. IMPRESSION: Cardiomegaly with pulmonary edema and small bilateral pleural effusions. Overall, findings are similar to the prior exam. Electronically  Signed   By: Hart Carwin  Plundo D.O.   On: 01/19/2021 20:07   DG Chest Portable 1 View  Result Date: 01/14/2021 CLINICAL DATA:  Shortness of breath EXAM: PORTABLE CHEST 1 VIEW COMPARISON:  12/28/2020 FINDINGS: Low lung volumes. Elevation of the right hemidiaphragm. There is improved aeration at the left lung base with patchy opacity remaining. Pulmonary vascular congestion with probable mild edema. No significant pleural effusion. Similar cardiomediastinal contours with mild cardiomegaly. IMPRESSION: Pulmonary vascular congestion with probable mild pulmonary edema. Improved aeration at the left lung base with patchy atelectasis/consolidation remaining. Electronically Signed   By: Macy Mis M.D.   On: 01/14/2021 16:13   DG Chest Portable 1 View  Result Date: 12/28/2020 CLINICAL DATA:  Shortness of breath EXAM: PORTABLE CHEST 1 VIEW COMPARISON:  11/19/2020 FINDINGS: Cardiomegaly with vascular congestion and diffuse interstitial opacity consistent with pulmonary edema. Small left greater than right pleural effusion. Basilar airspace disease on the left. Aortic atherosclerosis. No pneumothorax IMPRESSION: 1. Cardiomegaly with vascular congestion and pulmonary edema. 2. Small left greater than right pleural effusions with left basilar atelectasis or pneumonia. Similar findings on 11/19/2020 comparison Electronically Signed   By: Donavan Foil M.D.   On: 12/28/2020 03:20   DG Toe 3rd Right  Result Date: 12/28/2020 CLINICAL DATA:  Right third toe injury EXAM: RIGHT THIRD TOE COMPARISON:  None. FINDINGS: There is no evidence of fracture or dislocation. There is no evidence of arthropathy or other focal bone abnormality. Soft tissues are unremarkable. IMPRESSION: Negative. Electronically Signed   By: Monte Fantasia M.D.   On: 12/28/2020 05:39   ECHOCARDIOGRAM COMPLETE  Result Date: 12/28/2020    ECHOCARDIOGRAM REPORT   Patient Name:   [name] [name] Date of Exam: 12/28/2020 Medical Rec #:  [id number]      Height:       73.0 in Accession #:    [id number]    Weight:       308.6 lb Date of Birth:  [birth year]     BSA:          2.587 m Patient Age:    57 years      BP:           146/83 mmHg Patient Gender: M             HR:           75 bpm. Exam Location:  [facility] Procedure: 2D Echo, Cardiac Doppler and Color Doppler Indications:    CHF  History:        Patient has prior history of Echocardiogram examinations, most                 recent 07/20/2020. CAD, Arrythmias:LBBB and Atrial Fibrillation;                 Risk Factors:Diabetes, Dyslipidemia, Hypertension and Current                 Smoker.  Sonographer:    Maudry Mayhew MHA, RDMS, RVT, RDCS Referring Phys: [id number] ASIA B [place]  Sonographer Comments: Suboptimal subcostal window. Image acquisition challenging due to patient body habitus, Image acquisition challenging due to respiratory motion and on CPAP. IMPRESSIONS  1. Left ventricular ejection fraction, by estimation, is 55 to 60%. The left ventricle has normal function. The left ventricle has no regional wall motion abnormalities. The left ventricular internal cavity size was moderately dilated. Left ventricular diastolic function could not be evaluated.  2. Right ventricular systolic function is normal. The right ventricular size is mildly  enlarged. There is moderately elevated pulmonary artery systolic pressure.  3. Left atrial size was mildly dilated.  4. Right atrial size was moderately dilated.  5. The mitral valve is normal in structure. Trivial mitral valve regurgitation. No evidence of mitral stenosis.  6. The aortic valve is tricuspid. Aortic valve regurgitation is not visualized. Mild aortic valve sclerosis is present, with no evidence of aortic valve stenosis.  7. The inferior vena cava is dilated in size with >50% respiratory variability, suggesting right atrial pressure of 8 mmHg. FINDINGS  Left Ventricle: Left ventricular ejection fraction, by estimation, is 55 to 60%. The left  ventricle has normal function. The left ventricle has no regional wall motion abnormalities. Definity contrast agent was given IV to delineate the left ventricular  endocardial borders. The left ventricular internal cavity size was moderately dilated. There is no left ventricular hypertrophy. Left ventricular diastolic function could not be evaluated due to atrial fibrillation. Left ventricular diastolic function could not be evaluated. Right Ventricle: The right ventricular size is mildly enlarged.Right ventricular systolic function is normal. There is moderately elevated pulmonary artery systolic pressure. The tricuspid regurgitant velocity is 3.54 m/s, and with an assumed right atrial pressure of 8 mmHg, the estimated right ventricular systolic pressure is 06.2 mmHg. Left Atrium: Left atrial size was mildly dilated. Right Atrium: Right atrial size was moderately dilated. Pericardium: There is no evidence of pericardial effusion. Mitral Valve: The mitral valve is normal in structure. Trivial mitral valve regurgitation. No evidence of mitral valve stenosis. Tricuspid Valve: The tricuspid valve is normal in structure. Tricuspid valve regurgitation is trivial. No evidence of tricuspid stenosis. Aortic Valve: The aortic valve is tricuspid. Aortic valve regurgitation is not visualized. Mild aortic valve sclerosis is present, with no evidence of aortic valve stenosis. Aortic valve mean gradient measures 5.0 mmHg. Aortic valve peak gradient measures 10.2 mmHg. Aortic valve area, by VTI measures 1.90 cm. Pulmonic Valve: The pulmonic valve was not well visualized. Pulmonic valve regurgitation is not visualized. No evidence of pulmonic stenosis. Aorta: The aortic root is normal in size and structure. Venous: The inferior vena cava is dilated in size with greater than 50% respiratory variability, suggesting right atrial pressure of 8 mmHg.   LEFT VENTRICLE PLAX 2D LVIDd:         6.30 cm LVIDs:         4.80 cm LV PW:          0.70 cm LV IVS:        0.90 cm LVOT diam:     2.00 cm LV SV:         60 LV SV Index:   23 LVOT Area:     3.14 cm  LV Volumes (MOD) LV vol d, MOD A2C: 186.0 ml LV vol d, MOD A4C: 153.0 ml LV vol s, MOD A2C: 71.4 ml LV vol s, MOD A4C: 104.0 ml LV SV MOD A2C:     114.6 ml LV SV MOD A4C:     153.0 ml LV SV MOD BP:      88.7 ml RIGHT VENTRICLE TAPSE (M-mode): 1.7 cm LEFT ATRIUM              Index       RIGHT ATRIUM           Index LA diam:        4.10 cm  1.58 cm/m  RA Area:     32.20 cm LA Vol (A2C):   134.0 ml 51.79 ml/m  RA Volume:   137.00 ml 52.95 ml/m LA Vol (A4C):   58.3 ml  22.53 ml/m LA Biplane Vol: 90.1 ml  34.82 ml/m  AORTIC VALVE AV Area (Vmax):    1.84 cm AV Area (Vmean):   1.73 cm AV Area (VTI):     1.90 cm AV Vmax:           160.00 cm/s AV Vmean:          107.000 cm/s AV VTI:            0.315 m AV Peak Grad:      10.2 mmHg AV Mean Grad:      5.0 mmHg LVOT Vmax:         93.60 cm/s LVOT Vmean:        58.800 cm/s LVOT VTI:          0.191 m LVOT/AV VTI ratio: 0.61  AORTA Ao Root diam: 3.40 cm TRICUSPID VALVE TR Peak grad:   50.1 mmHg TR Vmax:        354.00 cm/s  SHUNTS Systemic VTI:  0.19 m Systemic Diam: 2.00 cm Kirk Ruths MD Electronically signed by Kirk Ruths MD Signature Date/Time: 12/28/2020/12:18:15 PM    Final      Subjective: Pt says that is feeling well and that he wants to go home.  He says that he has rested well and feels better than he has in years.  He wants to go home and has declined SNF today because it is not the facility that he wanted.    Discharge Exam: Vitals:   01/24/21 0823 01/24/21 1000  BP: (!) 139/97   Pulse: 99   Resp: 19   Temp: 98.1 F (36.7 C)   SpO2: 93% 93%   Vitals:   01/24/21 0738 01/24/21 0741 01/24/21 0823 01/24/21 1000  BP:   (!) 139/97   Pulse:  72 99   Resp:  19 19   Temp:   98.1 F (36.7 C)   TempSrc:   Oral   SpO2: 93% 93% 93% 93%  Weight:      Height:      General exam: Appears calm and comfortable Respiratory system: Clear  to auscultation. Respiratory effort normal. Cardiovascular system: normal S1 & S2 heard. No JVD, murmurs, rubs, gallops or clicks. No pedal edema. Gastrointestinal system: Abdomen is nondistended, soft and nontender. No organomegaly or masses felt. Normal bowel sounds heard. Central nervous system: Alert and oriented. No focal neurological deficits. Extremities: 1+ edema BLEs. Symmetric 5 x 5 power. Skin: No rashes, lesions or ulcers Psychiatry: Judgement and insight appear poor. Mood & affect appropriate.   The results of significant diagnostics from this hospitalization (including imaging, microbiology, ancillary and laboratory) are listed below for reference.     Microbiology: Recent Results (from the past 240 hour(s))  Resp Panel by RT-PCR (Flu A&B, Covid) Nasopharyngeal Swab     Status: None   Collection Time: 01/19/21  7:30 PM   Specimen: Nasopharyngeal Swab; Nasopharyngeal(NP) swabs in vial transport medium  Result Value Ref Range Status   SARS Coronavirus 2 by RT PCR NEGATIVE NEGATIVE Final    Comment: (NOTE) SARS-CoV-2 target nucleic acids are NOT DETECTED.  The SARS-CoV-2 RNA is generally detectable in upper respiratory specimens during the acute phase of infection. The lowest concentration of SARS-CoV-2 viral copies this assay can detect is 138 copies/mL. A negative result does not preclude SARS-Cov-2 infection and should not be used as the sole basis for treatment or other  patient management decisions. A negative result may occur with  improper specimen collection/handling, submission of specimen other than nasopharyngeal swab, presence of viral mutation(s) within the areas targeted by this assay, and inadequate number of viral copies(<138 copies/mL). A negative result must be combined with clinical observations, patient history, and epidemiological information. The expected result is Negative.  Fact Sheet for Patients:  EntrepreneurPulse.com.au  Fact  Sheet for Healthcare Providers:  IncredibleEmployment.be  This test is no t yet approved or cleared by the [place] FDA and  has been authorized for detection and/or diagnosis of SARS-CoV-2 by FDA under an Emergency Use Authorization (EUA). This EUA will remain  in effect (meaning this test can be used) for the duration of the COVID-19 declaration under Section 564(b)(1) of the Act, 21 U.S.C.section 360bbb-3(b)(1), unless the authorization is terminated  or revoked sooner.       Influenza A by PCR NEGATIVE NEGATIVE Final   Influenza B by PCR NEGATIVE NEGATIVE Final    Comment: (NOTE) The Xpert Xpress SARS-CoV-2/FLU/RSV plus assay is intended as an aid in the diagnosis of influenza from Nasopharyngeal swab specimens and should not be used as a sole basis for treatment. Nasal washings and aspirates are unacceptable for Xpert Xpress SARS-CoV-2/FLU/RSV testing.  Fact Sheet for Patients: EntrepreneurPulse.com.au  Fact Sheet for Healthcare Providers: IncredibleEmployment.be  This test is not yet approved or cleared by the [place] FDA and has been authorized for detection and/or diagnosis of SARS-CoV-2 by FDA under an Emergency Use Authorization (EUA). This EUA will remain in effect (meaning this test can be used) for the duration of the COVID-19 declaration under Section 564(b)(1) of the Act, 21 U.S.C. section 360bbb-3(b)(1), unless the authorization is terminated or revoked.  Performed at [facility], [address], [place], [place] [address]   MRSA Next Gen by PCR, Nasal     Status: None   Collection Time: 01/20/21  5:00 AM   Specimen: Nasal Mucosa; Nasal Swab  Result Value Ref Range Status   MRSA by PCR Next Gen NOT DETECTED NOT DETECTED Final    Comment: (NOTE) The GeneXpert MRSA Assay (FDA approved for NASAL specimens only), is one component of a comprehensive MRSA colonization surveillance program. It is  not intended to diagnose MRSA infection nor to guide or monitor treatment for MRSA infections. Test performance is not FDA approved in patients less than 48 years old. Performed at [facility], [address], [place], [place] [address]   Resp Panel by RT-PCR (Flu A&B, Covid) Nasopharyngeal Swab     Status: None   Collection Time: 01/23/21  2:36 PM   Specimen: Nasopharyngeal Swab; Nasopharyngeal(NP) swabs in vial transport medium  Result Value Ref Range Status   SARS Coronavirus 2 by RT PCR NEGATIVE NEGATIVE Final    Comment: (NOTE) SARS-CoV-2 target nucleic acids are NOT DETECTED.  The SARS-CoV-2 RNA is generally detectable in upper respiratory specimens during the acute phase of infection. The lowest concentration of SARS-CoV-2 viral copies this assay can detect is 138 copies/mL. A negative result does not preclude SARS-Cov-2 infection and should not be used as the sole basis for treatment or other patient management decisions. A negative result may occur with  improper specimen collection/handling, submission of specimen other than nasopharyngeal swab, presence of viral mutation(s) within the areas targeted by this assay, and inadequate number of viral copies(<138 copies/mL). A negative result must be combined with clinical observations, patient history, and epidemiological information. The expected result is Negative.  Fact Sheet for Patients:  EntrepreneurPulse.com.au  Fact Sheet for Healthcare Providers:  IncredibleEmployment.be  This test is no t yet approved or cleared by the [place] FDA and  has been authorized for detection and/or diagnosis of SARS-CoV-2 by FDA under an Emergency Use Authorization (EUA). This EUA will remain  in effect (meaning this test can be used) for the duration of the COVID-19 declaration under Section 564(b)(1) of the Act, 21 U.S.C.section 360bbb-3(b)(1), unless the authorization is terminated  or revoked  sooner.       Influenza A by PCR NEGATIVE NEGATIVE Final   Influenza B by PCR NEGATIVE NEGATIVE Final    Comment: (NOTE) The Xpert Xpress SARS-CoV-2/FLU/RSV plus assay is intended as an aid in the diagnosis of influenza from Nasopharyngeal swab specimens and should not be used as a sole basis for treatment. Nasal washings and aspirates are unacceptable for Xpert Xpress SARS-CoV-2/FLU/RSV testing.  Fact Sheet for Patients: EntrepreneurPulse.com.au  Fact Sheet for Healthcare Providers: IncredibleEmployment.be  This test is not yet approved or cleared by the [place] FDA and has been authorized for detection and/or diagnosis of SARS-CoV-2 by FDA under an Emergency Use Authorization (EUA). This EUA will remain in effect (meaning this test can be used) for the duration of the COVID-19 declaration under Section 564(b)(1) of the Act, 21 U.S.C. section 360bbb-3(b)(1), unless the authorization is terminated or revoked.  Performed at [facility], [address] [place], [place] [address]      Labs: BNP (last 3 results) Recent Labs    01/02/21 0556 01/14/21 1648 01/19/21 1930  BNP 171.0* 163.0* 449.6*   Basic Metabolic Panel: Recent Labs  Lab 01/19/21 1930 01/20/21 0636 01/21/21 0715 01/22/21 0347 01/23/21 0528  NA 142 143 139 138 137  K 3.5 3.5 5.5* 4.4 4.8  CL 99 101 99 98 96*  CO2 33* 32 28 31 30   GLUCOSE 118* 76 137* 130* 150*  BUN 41* 38* 48* 64* 77*  CREATININE 2.44* 2.28* 2.58* 2.91* 3.04*  CALCIUM 8.7* 8.7* 8.6* 8.9 8.7*  MG  --  2.4 2.5* 2.5*  --   PHOS  --  4.1  --   --   --    Liver Function Tests: Recent Labs  Lab 01/19/21 1930 01/20/21 0636  AST 11* 12*  ALT 11 11  ALKPHOS 106 89  BILITOT 0.6 0.8  PROT 7.3 6.8  ALBUMIN 3.9 3.6   No results for input(s): LIPASE, AMYLASE in the last 168 hours. No results for input(s): AMMONIA in the last 168 hours. CBC: Recent Labs  Lab 01/19/21 1930 01/20/21 0636  01/21/21 0715 01/22/21 0347  WBC 6.5 6.2 5.9 6.5  HGB 14.1 14.0 14.2 13.9  HCT 47.3 47.4 47.0 45.5  MCV 94.4 94.6 94.0 92.3  PLT 126* 121* 132* 174   Cardiac Enzymes: No results for input(s): CKTOTAL, CKMB, CKMBINDEX, TROPONINI in the last 168 hours. BNP: Invalid input(s): POCBNP CBG: No results for input(s): GLUCAP in the last 168 hours. D-Dimer No results for input(s): DDIMER in the last 72 hours. Hgb A1c No results for input(s): HGBA1C in the last 72 hours. Lipid Profile No results for input(s): CHOL, HDL, LDLCALC, TRIG, CHOLHDL, LDLDIRECT in the last 72 hours. Thyroid function studies No results for input(s): TSH, T4TOTAL, T3FREE, THYROIDAB in the last 72 hours.  Invalid input(s): FREET3 Anemia work up No results for input(s): VITAMINB12, FOLATE, FERRITIN, TIBC, IRON, RETICCTPCT in the last 72 hours. Urinalysis    Component Value Date/Time   COLORURINE YELLOW 11/19/2020 1736  APPEARANCEUR CLEAR 11/19/2020 1736   LABSPEC 1.015 11/19/2020 1736   PHURINE 6.0 11/19/2020 1736   GLUCOSEU NEGATIVE 11/19/2020 1736   HGBUR NEGATIVE 11/19/2020 1736   BILIRUBINUR NEGATIVE 11/19/2020 1736   KETONESUR NEGATIVE 11/19/2020 1736   PROTEINUR >=300 (A) 11/19/2020 1736   UROBILINOGEN 4.0 (H) 07/23/2014 1356   NITRITE NEGATIVE 11/19/2020 1736   LEUKOCYTESUR NEGATIVE 11/19/2020 1736   Sepsis Labs Invalid input(s): PROCALCITONIN,  WBC,  LACTICIDVEN Microbiology Recent Results (from the past 240 hour(s))  Resp Panel by RT-PCR (Flu A&B, Covid) Nasopharyngeal Swab     Status: None   Collection Time: 01/19/21  7:30 PM   Specimen: Nasopharyngeal Swab; Nasopharyngeal(NP) swabs in vial transport medium  Result Value Ref Range Status   SARS Coronavirus 2 by RT PCR NEGATIVE NEGATIVE Final    Comment: (NOTE) SARS-CoV-2 target nucleic acids are NOT DETECTED.  The SARS-CoV-2 RNA is generally detectable in upper respiratory specimens during the acute phase of infection. The  lowest concentration of SARS-CoV-2 viral copies this assay can detect is 138 copies/mL. A negative result does not preclude SARS-Cov-2 infection and should not be used as the sole basis for treatment or other patient management decisions. A negative result may occur with  improper specimen collection/handling, submission of specimen other than nasopharyngeal swab, presence of viral mutation(s) within the areas targeted by this assay, and inadequate number of viral copies(<138 copies/mL). A negative result must be combined with clinical observations, patient history, and epidemiological information. The expected result is Negative.  Fact Sheet for Patients:  EntrepreneurPulse.com.au  Fact Sheet for Healthcare Providers:  IncredibleEmployment.be  This test is no t yet approved or cleared by the [place] FDA and  has been authorized for detection and/or diagnosis of SARS-CoV-2 by FDA under an Emergency Use Authorization (EUA). This EUA will remain  in effect (meaning this test can be used) for the duration of the COVID-19 declaration under Section 564(b)(1) of the Act, 21 U.S.C.section 360bbb-3(b)(1), unless the authorization is terminated  or revoked sooner.       Influenza A by PCR NEGATIVE NEGATIVE Final   Influenza B by PCR NEGATIVE NEGATIVE Final    Comment: (NOTE) The Xpert Xpress SARS-CoV-2/FLU/RSV plus assay is intended as an aid in the diagnosis of influenza from Nasopharyngeal swab specimens and should not be used as a sole basis for treatment. Nasal washings and aspirates are unacceptable for Xpert Xpress SARS-CoV-2/FLU/RSV testing.  Fact Sheet for Patients: EntrepreneurPulse.com.au  Fact Sheet for Healthcare Providers: IncredibleEmployment.be  This test is not yet approved or cleared by the [place] FDA and has been authorized for detection and/or diagnosis of SARS-CoV-2 by FDA under  an Emergency Use Authorization (EUA). This EUA will remain in effect (meaning this test can be used) for the duration of the COVID-19 declaration under Section 564(b)(1) of the Act, 21 U.S.C. section 360bbb-3(b)(1), unless the authorization is terminated or revoked.  Performed at [facility], [address], [place], [place] [address]   MRSA Next Gen by PCR, Nasal     Status: None   Collection Time: 01/20/21  5:00 AM   Specimen: Nasal Mucosa; Nasal Swab  Result Value Ref Range Status   MRSA by PCR Next Gen NOT DETECTED NOT DETECTED Final    Comment: (NOTE) The GeneXpert MRSA Assay (FDA approved for NASAL specimens only), is one component of a comprehensive MRSA colonization surveillance program. It is not intended to diagnose MRSA infection nor to guide or monitor treatment for MRSA infections. Test performance  is not FDA approved in patients less than 23 years old. Performed at [facility], [address], [place], [place] [address]   Resp Panel by RT-PCR (Flu A&B, Covid) Nasopharyngeal Swab     Status: None   Collection Time: 01/23/21  2:36 PM   Specimen: Nasopharyngeal Swab; Nasopharyngeal(NP) swabs in vial transport medium  Result Value Ref Range Status   SARS Coronavirus 2 by RT PCR NEGATIVE NEGATIVE Final    Comment: (NOTE) SARS-CoV-2 target nucleic acids are NOT DETECTED.  The SARS-CoV-2 RNA is generally detectable in upper respiratory specimens during the acute phase of infection. The lowest concentration of SARS-CoV-2 viral copies this assay can detect is 138 copies/mL. A negative result does not preclude SARS-Cov-2 infection and should not be used as the sole basis for treatment or other patient management decisions. A negative result may occur with  improper specimen collection/handling, submission of specimen other than nasopharyngeal swab, presence of viral mutation(s) within the areas targeted by this assay, and inadequate number of viral copies(<138 copies/mL). A  negative result must be combined with clinical observations, patient history, and epidemiological information. The expected result is Negative.  Fact Sheet for Patients:  EntrepreneurPulse.com.au  Fact Sheet for Healthcare Providers:  IncredibleEmployment.be  This test is no t yet approved or cleared by the [place] FDA and  has been authorized for detection and/or diagnosis of SARS-CoV-2 by FDA under an Emergency Use Authorization (EUA). This EUA will remain  in effect (meaning this test can be used) for the duration of the COVID-19 declaration under Section 564(b)(1) of the Act, 21 U.S.C.section 360bbb-3(b)(1), unless the authorization is terminated  or revoked sooner.       Influenza A by PCR NEGATIVE NEGATIVE Final   Influenza B by PCR NEGATIVE NEGATIVE Final    Comment: (NOTE) The Xpert Xpress SARS-CoV-2/FLU/RSV plus assay is intended as an aid in the diagnosis of influenza from Nasopharyngeal swab specimens and should not be used as a sole basis for treatment. Nasal washings and aspirates are unacceptable for Xpert Xpress SARS-CoV-2/FLU/RSV testing.  Fact Sheet for Patients: EntrepreneurPulse.com.au  Fact Sheet for Healthcare Providers: IncredibleEmployment.be  This test is not yet approved or cleared by the [place] FDA and has been authorized for detection and/or diagnosis of SARS-CoV-2 by FDA under an Emergency Use Authorization (EUA). This EUA will remain in effect (meaning this test can be used) for the duration of the COVID-19 declaration under Section 564(b)(1) of the Act, 21 U.S.C. section 360bbb-3(b)(1), unless the authorization is terminated or revoked.  Performed at [facility], [address], [place], [place] [address]     Time coordinating discharge: 33 minutes   SIGNED:  Irwin Brakeman, MD  [facility] 01/24/2021, 12:30 PM How to contact the [facility] Attending  or Consulting provider [place] or covering provider during after hours [place], for this patient?  Check the care team in [facility] and look for a) attending/consulting TRH provider listed and b) the [facility] team listed Log into [email or web address] and use [facility] universal password to access. If you do not have the password, please contact the hospital operator. Locate the [facility] provider you are looking for under [facility] and page to a number that you can be directly reached. If you still have difficulty reaching the provider, please page the [facility] (Director on Call) for the Hospitalists listed on amion for assistance.

## 2021-01-24 NOTE — TOC Transition Note (Signed)
[facility] of [facility]) - CM/SW Discharge Note   Patient Details  Name: [name] [name] MRN: [id number] Date of Birth: 1945/12/03  [facility] CM/SW Contact:  Shade Flood, LCSW Phone Number: 01/24/2021, 10:36 AM   Clinical Narrative:     TOC following. Pt has only received a bed offer from [facility]. Discussed with pt who states he does not want that facility. Offered to refer out to [facility] or [facility]. Pt stated he did not want any other referrals and that he preferred to go home with Home Health. Pt stated he is active with [facility] and would like to continue with them.  Called to [facility] and they will resume care.   Pt states that his daughter will come get him and she will bring portable O2 with her.  There are no other TOC needs for dc.  Final next level of care: [place] Barriers to Discharge: Barriers Resolved   Patient Goals and CMS Choice Patient states their goals for this hospitalization and ongoing recovery are:: rehab CMS Medicare.gov Compare Post Acute Care list provided to:: Patient Choice offered to / list presented to : Patient  Discharge Placement                       Discharge Plan and Services In-house Referral: Clinical Social Work   Post Acute Care Choice: [place]                               Social Determinants of Health (SDOH) Interventions     Readmission Risk Interventions Readmission Risk Prevention Plan 12/29/2020 10/07/2020 08/05/2020  Transportation Screening Complete Complete Complete  Medication Review Press photographer) Complete Complete Complete  PCP or Specialist appointment within 3-5 days of discharge Complete - -  HRI or Home Care Consult Complete Complete Complete  SW Recovery Care/Counseling Consult Complete Complete Complete  Palliative Care Screening Not Applicable Not Applicable Not Applicable  Skilled Nursing Facility Complete Not  Applicable Not Applicable  Some recent data might be hidden

## 2021-01-30 ENCOUNTER — Encounter (HOSPITAL_COMMUNITY): Payer: Self-pay | Admitting: Emergency Medicine

## 2021-01-30 ENCOUNTER — Other Ambulatory Visit: Payer: Self-pay

## 2021-01-30 ENCOUNTER — Inpatient Hospital Stay (HOSPITAL_COMMUNITY)
Admission: EM | Admit: 2021-01-30 | Discharge: 2021-02-07 | DRG: 291 | Disposition: A | Discharge status: Skilled Nursing Facility | Payer: Medicare HMO | Attending: Internal Medicine | Admitting: Internal Medicine

## 2021-01-30 ENCOUNTER — Emergency Department (HOSPITAL_COMMUNITY): Payer: Medicare HMO

## 2021-01-30 DIAGNOSIS — E039 Hypothyroidism, unspecified: Secondary | ICD-10-CM | POA: Diagnosis present

## 2021-01-30 DIAGNOSIS — Z803 Family history of malignant neoplasm of breast: Secondary | ICD-10-CM

## 2021-01-30 DIAGNOSIS — I447 Left bundle-branch block, unspecified: Secondary | ICD-10-CM | POA: Diagnosis present

## 2021-01-30 DIAGNOSIS — J9622 Acute and chronic respiratory failure with hypercapnia: Secondary | ICD-10-CM

## 2021-01-30 DIAGNOSIS — E11649 Type 2 diabetes mellitus with hypoglycemia without coma: Secondary | ICD-10-CM | POA: Diagnosis not present

## 2021-01-30 DIAGNOSIS — Z8052 Family history of malignant neoplasm of bladder: Secondary | ICD-10-CM

## 2021-01-30 DIAGNOSIS — I2510 Atherosclerotic heart disease of native coronary artery without angina pectoris: Secondary | ICD-10-CM | POA: Diagnosis present

## 2021-01-30 DIAGNOSIS — N1832 Chronic kidney disease, stage 3b: Secondary | ICD-10-CM | POA: Diagnosis not present

## 2021-01-30 DIAGNOSIS — Z20822 Contact with and (suspected) exposure to covid-19: Secondary | ICD-10-CM | POA: Diagnosis present

## 2021-01-30 DIAGNOSIS — E785 Hyperlipidemia, unspecified: Secondary | ICD-10-CM | POA: Diagnosis present

## 2021-01-30 DIAGNOSIS — Z8616 Personal history of COVID-19: Secondary | ICD-10-CM

## 2021-01-30 DIAGNOSIS — G4733 Obstructive sleep apnea (adult) (pediatric): Secondary | ICD-10-CM | POA: Diagnosis present

## 2021-01-30 DIAGNOSIS — Z7901 Long term (current) use of anticoagulants: Secondary | ICD-10-CM

## 2021-01-30 DIAGNOSIS — I5033 Acute on chronic diastolic (congestive) heart failure: Secondary | ICD-10-CM | POA: Diagnosis present

## 2021-01-30 DIAGNOSIS — Z955 Presence of coronary angioplasty implant and graft: Secondary | ICD-10-CM

## 2021-01-30 DIAGNOSIS — I5031 Acute diastolic (congestive) heart failure: Secondary | ICD-10-CM | POA: Diagnosis not present

## 2021-01-30 DIAGNOSIS — Z923 Personal history of irradiation: Secondary | ICD-10-CM

## 2021-01-30 DIAGNOSIS — I482 Chronic atrial fibrillation, unspecified: Secondary | ICD-10-CM

## 2021-01-30 DIAGNOSIS — Z8572 Personal history of non-Hodgkin lymphomas: Secondary | ICD-10-CM | POA: Diagnosis not present

## 2021-01-30 DIAGNOSIS — Z6841 Body Mass Index (BMI) 40.0 and over, adult: Secondary | ICD-10-CM

## 2021-01-30 DIAGNOSIS — Z800 Family history of malignant neoplasm of digestive organs: Secondary | ICD-10-CM

## 2021-01-30 DIAGNOSIS — I4821 Permanent atrial fibrillation: Secondary | ICD-10-CM | POA: Diagnosis present

## 2021-01-30 DIAGNOSIS — I509 Heart failure, unspecified: Secondary | ICD-10-CM | POA: Diagnosis present

## 2021-01-30 DIAGNOSIS — Z87891 Personal history of nicotine dependence: Secondary | ICD-10-CM

## 2021-01-30 DIAGNOSIS — J9621 Acute and chronic respiratory failure with hypoxia: Secondary | ICD-10-CM | POA: Diagnosis present

## 2021-01-30 DIAGNOSIS — I252 Old myocardial infarction: Secondary | ICD-10-CM

## 2021-01-30 DIAGNOSIS — Z79899 Other long term (current) drug therapy: Secondary | ICD-10-CM

## 2021-01-30 DIAGNOSIS — I130 Hypertensive heart and chronic kidney disease with heart failure and stage 1 through stage 4 chronic kidney disease, or unspecified chronic kidney disease: Secondary | ICD-10-CM | POA: Diagnosis present

## 2021-01-30 DIAGNOSIS — N184 Chronic kidney disease, stage 4 (severe): Secondary | ICD-10-CM

## 2021-01-30 DIAGNOSIS — Z7189 Other specified counseling: Secondary | ICD-10-CM

## 2021-01-30 DIAGNOSIS — N189 Chronic kidney disease, unspecified: Secondary | ICD-10-CM | POA: Diagnosis present

## 2021-01-30 DIAGNOSIS — I480 Paroxysmal atrial fibrillation: Secondary | ICD-10-CM | POA: Diagnosis not present

## 2021-01-30 DIAGNOSIS — Z515 Encounter for palliative care: Secondary | ICD-10-CM

## 2021-01-30 DIAGNOSIS — G629 Polyneuropathy, unspecified: Secondary | ICD-10-CM | POA: Diagnosis present

## 2021-01-30 DIAGNOSIS — I10 Essential (primary) hypertension: Secondary | ICD-10-CM | POA: Diagnosis present

## 2021-01-30 DIAGNOSIS — E1122 Type 2 diabetes mellitus with diabetic chronic kidney disease: Secondary | ICD-10-CM | POA: Diagnosis present

## 2021-01-30 DIAGNOSIS — Z9981 Dependence on supplemental oxygen: Secondary | ICD-10-CM

## 2021-01-30 DIAGNOSIS — Z8042 Family history of malignant neoplasm of prostate: Secondary | ICD-10-CM

## 2021-01-30 DIAGNOSIS — Z96653 Presence of artificial knee joint, bilateral: Secondary | ICD-10-CM | POA: Diagnosis present

## 2021-01-30 DIAGNOSIS — J441 Chronic obstructive pulmonary disease with (acute) exacerbation: Secondary | ICD-10-CM | POA: Diagnosis present

## 2021-01-30 DIAGNOSIS — Z23 Encounter for immunization: Secondary | ICD-10-CM | POA: Diagnosis not present

## 2021-01-30 DIAGNOSIS — Z7951 Long term (current) use of inhaled steroids: Secondary | ICD-10-CM

## 2021-01-30 DIAGNOSIS — Z7982 Long term (current) use of aspirin: Secondary | ICD-10-CM

## 2021-01-30 DIAGNOSIS — Z8701 Personal history of pneumonia (recurrent): Secondary | ICD-10-CM

## 2021-01-30 DIAGNOSIS — Z9049 Acquired absence of other specified parts of digestive tract: Secondary | ICD-10-CM

## 2021-01-30 DIAGNOSIS — Z79890 Hormone replacement therapy (postmenopausal): Secondary | ICD-10-CM

## 2021-01-30 LAB — BASIC METABOLIC PANEL
Anion gap: 8 (ref 5–15)
BUN: 53 mg/dL — ABNORMAL HIGH (ref 8–23)
CO2: 31 mmol/L (ref 22–32)
Calcium: 8.9 mg/dL (ref 8.9–10.3)
Chloride: 102 mmol/L (ref 98–111)
Creatinine, Ser: 2.36 mg/dL — ABNORMAL HIGH (ref 0.61–1.24)
GFR, Estimated: 28 mL/min — ABNORMAL LOW (ref >60)
Glucose, Bld: 127 mg/dL — ABNORMAL HIGH (ref 70–99)
Potassium: 3.9 mmol/L (ref 3.5–5.1)
Sodium: 141 mmol/L (ref 135–145)

## 2021-01-30 LAB — CBC WITH DIFFERENTIAL/PLATELET
Abs Immature Granulocytes: 0.05 10*3/uL (ref 0.00–0.07)
Basophils Absolute: 0 10*3/uL (ref 0.0–0.1)
Basophils Relative: 0 %
Eosinophils Absolute: 0 10*3/uL (ref 0.0–0.5)
Eosinophils Relative: 1 %
HCT: 51 % (ref 39.0–52.0)
Hemoglobin: 15.3 g/dL (ref 13.0–17.0)
Immature Granulocytes: 1 %
Lymphocytes Relative: 7 %
Lymphs Abs: 0.6 10*3/uL — ABNORMAL LOW (ref 0.7–4.0)
MCH: 28.3 pg (ref 26.0–34.0)
MCHC: 30 g/dL (ref 30.0–36.0)
MCV: 94.4 fL (ref 80.0–100.0)
Monocytes Absolute: 0.2 10*3/uL (ref 0.1–1.0)
Monocytes Relative: 2 %
Neutro Abs: 7.5 10*3/uL (ref 1.7–7.7)
Neutrophils Relative %: 89 %
Platelets: 164 10*3/uL (ref 150–400)
RBC: 5.4 MIL/uL (ref 4.22–5.81)
RDW: 17.5 % — ABNORMAL HIGH (ref 11.5–15.5)
WBC: 8.4 10*3/uL (ref 4.0–10.5)
nRBC: 0 % (ref 0.0–0.2)

## 2021-01-30 LAB — BLOOD GAS, ARTERIAL
Acid-Base Excess: 6.6 mmol/L — ABNORMAL HIGH (ref 0.0–2.0)
Bicarbonate: 29.1 mmol/L — ABNORMAL HIGH (ref 20.0–28.0)
FIO2: 44
O2 Saturation: 86.5 %
Patient temperature: 36.8
pCO2 arterial: 50.8 mmHg — ABNORMAL HIGH (ref 32.0–48.0)
pH, Arterial: 7.406 (ref 7.350–7.450)
pO2, Arterial: 51.8 mmHg — ABNORMAL LOW (ref 83.0–108.0)

## 2021-01-30 LAB — GLUCOSE, CAPILLARY
Glucose-Capillary: 138 mg/dL — ABNORMAL HIGH (ref 70–99)
Glucose-Capillary: 118 mg/dL — ABNORMAL HIGH (ref 70–99)

## 2021-01-30 LAB — RESP PANEL BY RT-PCR (FLU A&B, COVID) ARPGX2
Influenza A by PCR: NEGATIVE
Influenza B by PCR: NEGATIVE
SARS Coronavirus 2 by RT PCR: NEGATIVE

## 2021-01-30 LAB — CBG MONITORING, ED: Glucose-Capillary: 139 mg/dL — ABNORMAL HIGH (ref 70–99)

## 2021-01-30 LAB — PROCALCITONIN: Procalcitonin: <0.1 ng/mL

## 2021-01-30 LAB — TROPONIN I (HIGH SENSITIVITY)
Troponin I (High Sensitivity): 41 ng/L — ABNORMAL HIGH (ref <18)
Troponin I (High Sensitivity): 47 ng/L — ABNORMAL HIGH (ref <18)

## 2021-01-30 LAB — BRAIN NATRIURETIC PEPTIDE: B Natriuretic Peptide: 267 pg/mL — ABNORMAL HIGH (ref 0.0–100.0)

## 2021-01-30 MED ORDER — METHYLPREDNISOLONE SODIUM SUCC 125 MG IJ SOLR: 80.0000 mg | Freq: Once | INTRAMUSCULAR | Status: DC

## 2021-01-30 MED ORDER — ACETAMINOPHEN 325 MG PO TABS: 650.0000 mg | ORAL_TABLET | Freq: Four times a day (QID) | ORAL | Status: DC | PRN

## 2021-01-30 MED ORDER — LEVOTHYROXINE SODIUM 50 MCG PO TABS: 100.0000 ug | ORAL_TABLET | Freq: Every day | ORAL | Status: DC

## 2021-01-30 MED ORDER — MAGNESIUM SULFATE 2 GM/50ML IV SOLN
2.0000 g | Freq: Once | INTRAVENOUS | Status: AC
  Administered 2021-01-30: 2 g via INTRAVENOUS
  Filled 2021-01-30: qty 50

## 2021-01-30 MED ORDER — POTASSIUM CHLORIDE CRYS ER 10 MEQ PO TBCR
10.0000 meq | EXTENDED_RELEASE_TABLET | Freq: Every day | ORAL | Status: DC
  Administered 2021-01-30 – 2021-02-07 (×9): 10 meq via ORAL
  Filled 2021-01-30 (×9): qty 1

## 2021-01-30 MED ORDER — FUROSEMIDE 10 MG/ML IJ SOLN
40.0000 mg | Freq: Two times a day (BID) | INTRAMUSCULAR | Status: DC
  Administered 2021-01-30 – 2021-01-31 (×3): 40 mg via INTRAVENOUS
  Filled 2021-01-30 (×3): qty 4

## 2021-01-30 MED ORDER — INSULIN ASPART 100 UNIT/ML IJ SOLN
0.0000 [IU] | Freq: Every day | INTRAMUSCULAR | Status: DC
  Administered 2021-02-01: 2 [IU] via SUBCUTANEOUS

## 2021-01-30 MED ORDER — SENNOSIDES-DOCUSATE SODIUM 8.6-50 MG PO TABS: 1.0000 | ORAL_TABLET | Freq: Every day | ORAL | Status: DC | PRN

## 2021-01-30 MED ORDER — SODIUM CHLORIDE 0.9 % IV SOLN: 250.0000 mL | INTRAVENOUS | Status: DC | PRN

## 2021-01-30 MED ORDER — FUROSEMIDE 10 MG/ML IJ SOLN
40.0000 mg | Freq: Once | INTRAMUSCULAR | Status: AC
  Administered 2021-01-30: 40 mg via INTRAVENOUS
  Filled 2021-01-30: qty 4

## 2021-01-30 MED ORDER — SODIUM CHLORIDE 0.9% FLUSH
3.0000 mL | Freq: Two times a day (BID) | INTRAVENOUS | Status: DC
  Administered 2021-01-30 – 2021-02-06 (×12): 3 mL via INTRAVENOUS

## 2021-01-30 MED ORDER — LORATADINE 10 MG PO TABS
10.0000 mg | ORAL_TABLET | Freq: Every evening | ORAL | Status: DC
  Administered 2021-01-30 – 2021-02-06 (×8): 10 mg via ORAL
  Filled 2021-01-30 (×8): qty 1

## 2021-01-30 MED ORDER — INSULIN ASPART 100 UNIT/ML IJ SOLN
0.0000 [IU] | Freq: Three times a day (TID) | INTRAMUSCULAR | Status: DC
  Administered 2021-01-30 (×2): 2 [IU] via SUBCUTANEOUS
  Administered 2021-02-01: 3 [IU] via SUBCUTANEOUS
  Administered 2021-02-01 – 2021-02-02 (×2): 2 [IU] via SUBCUTANEOUS
  Administered 2021-02-02: 3 [IU] via SUBCUTANEOUS
  Administered 2021-02-03: 2 [IU] via SUBCUTANEOUS
  Administered 2021-02-03 – 2021-02-04 (×2): 3 [IU] via SUBCUTANEOUS
  Administered 2021-02-04 – 2021-02-05 (×2): 2 [IU] via SUBCUTANEOUS
  Administered 2021-02-06: 5 [IU] via SUBCUTANEOUS
  Administered 2021-02-06 (×2): 3 [IU] via SUBCUTANEOUS
  Administered 2021-02-07: 5 [IU] via SUBCUTANEOUS
  Filled 2021-01-30: qty 1

## 2021-01-30 MED ORDER — ACETAMINOPHEN 650 MG RE SUPP: 650.0000 mg | Freq: Four times a day (QID) | RECTAL | Status: DC | PRN

## 2021-01-30 MED ORDER — INSULIN GLARGINE 100 UNIT/ML ~~LOC~~ SOLN
25.0000 [IU] | Freq: Every day | SUBCUTANEOUS | Status: DC
  Administered 2021-01-30: 25 [IU] via SUBCUTANEOUS
  Filled 2021-01-30 (×4): qty 0.25

## 2021-01-30 MED ORDER — LEVOTHYROXINE SODIUM 100 MCG PO TABS
300.0000 ug | ORAL_TABLET | Freq: Every day | ORAL | Status: DC
  Administered 2021-01-31 – 2021-02-07 (×8): 300 ug via ORAL
  Filled 2021-01-30 (×11): qty 3

## 2021-01-30 MED ORDER — IPRATROPIUM-ALBUTEROL 0.5-2.5 (3) MG/3ML IN SOLN
3.0000 mL | Freq: Once | RESPIRATORY_TRACT | Status: AC
  Administered 2021-01-30: 3 mL via RESPIRATORY_TRACT
  Filled 2021-01-30: qty 3

## 2021-01-30 MED ORDER — APIXABAN 5 MG PO TABS
5.0000 mg | ORAL_TABLET | Freq: Two times a day (BID) | ORAL | Status: DC
  Administered 2021-01-30 – 2021-02-07 (×17): 5 mg via ORAL
  Filled 2021-01-30 (×17): qty 1

## 2021-01-30 MED ORDER — SODIUM CHLORIDE 0.9% FLUSH: 3.0000 mL | INTRAVENOUS | Status: DC | PRN

## 2021-01-30 MED ORDER — IPRATROPIUM-ALBUTEROL 0.5-2.5 (3) MG/3ML IN SOLN
3.0000 mL | RESPIRATORY_TRACT | Status: DC | PRN
  Administered 2021-01-30 – 2021-02-01 (×3): 3 mL via RESPIRATORY_TRACT
  Filled 2021-01-30 (×3): qty 3

## 2021-01-30 MED ORDER — ISOSORBIDE MONONITRATE ER 60 MG PO TB24
30.0000 mg | ORAL_TABLET | Freq: Every day | ORAL | Status: DC
  Administered 2021-01-30 – 2021-02-07 (×9): 30 mg via ORAL
  Filled 2021-01-30 (×9): qty 1

## 2021-01-30 MED ORDER — MOMETASONE FURO-FORMOTEROL FUM 200-5 MCG/ACT IN AERO
2.0000 | INHALATION_SPRAY | Freq: Two times a day (BID) | RESPIRATORY_TRACT | Status: DC
  Administered 2021-01-30 – 2021-02-07 (×17): 2 via RESPIRATORY_TRACT
  Filled 2021-01-30: qty 8.8

## 2021-01-30 MED ORDER — BISOPROLOL FUMARATE 5 MG PO TABS
5.0000 mg | ORAL_TABLET | Freq: Every day | ORAL | Status: DC
  Administered 2021-01-30 – 2021-02-07 (×9): 5 mg via ORAL
  Filled 2021-01-30 (×9): qty 1

## 2021-01-30 MED ORDER — ALBUTEROL SULFATE HFA 108 (90 BASE) MCG/ACT IN AERS
4.0000 | INHALATION_SPRAY | Freq: Once | RESPIRATORY_TRACT | Status: AC
  Administered 2021-01-30: 4 via RESPIRATORY_TRACT
  Filled 2021-01-30: qty 6.7

## 2021-01-30 MED ORDER — ONDANSETRON HCL 4 MG/2ML IJ SOLN: 4.0000 mg | Freq: Four times a day (QID) | INTRAMUSCULAR | Status: DC | PRN

## 2021-01-30 MED ORDER — SIMVASTATIN 10 MG PO TABS
5.0000 mg | ORAL_TABLET | Freq: Every day | ORAL | Status: DC
  Administered 2021-01-30 – 2021-02-06 (×8): 5 mg via ORAL
  Filled 2021-01-30 (×8): qty 1

## 2021-01-30 MED ORDER — NITROGLYCERIN 0.4 MG SL SUBL: 0.4000 mg | SUBLINGUAL_TABLET | SUBLINGUAL | Status: DC | PRN

## 2021-01-30 MED ORDER — ONDANSETRON HCL 4 MG PO TABS: 4.0000 mg | ORAL_TABLET | Freq: Four times a day (QID) | ORAL | Status: DC | PRN

## 2021-01-30 NOTE — ED Notes (Signed)
Per Dr. Manuella Ghazi, pt placed on trial with nasal cannula at 5L. Will continue to monitor. Sats remain between 92-94% on 5L via [place]

## 2021-01-30 NOTE — Consult Note (Signed)
Consultation Note Date: 01/30/2021   Patient Name: [name] [name]  DOB: [birth year]  MRN: [id number]  Age / Sex: 75 y.o., male  PCP: Nickola Major, MD Referring Physician: Bernadette Hoit, DO  Reason for Consultation: Establishing goals of care  HPI/Patient Profile: 75 y.o. male  with past medical history of heart failure with preserved ejection fraction, CAD, A. fib on Eliquis, CKD 4, COPD, HTN, non-Hodgkin's lymphoma, DM, recent hospitalization from 6/19 through 6/24 for similar complaints, also in the ED on 6/14, prior to that admitted 5/28 through 6/2 admitted on 01/30/2021 with acute on chronic diastolic heart failure, acute on chronic respiratory failure.   Clinical Assessment and Goals of Care: I have reviewed medical records including EPIC notes, labs and imaging, received report from RN, assessed the patient and then met at the bedside to discuss diagnosis prognosis, GOC, EOL wishes, disposition and options.  [name] [name] is known to the palliative medicine team as he was just seen last week.  I introduced Palliative Medicine as specialized medical care for people living with serious illness. It focuses on providing relief from the symptoms and stress of a serious illness. The goal is to improve quality of life for both the patient and the family.  I asked [name] [name] how he is doing and he states, "I am fine".  He tells me that he was having trouble breathing because his nose is stopped up.  He tells me that he is no longer having issues breathing.  I ask if he would still like to be admitted to the hospital if he is doing okay and he tells me that he would.  We talked about healthcare power of attorney.  He names his daughter [name] [name] who goes by [name] [name].  As we are talking the phone rings and [name] answers the phone and proceeds to have a conversation with whoever has called.  I share that I will follow-up  tomorrow. Discussed the importance of continued conversation with family and the medical providers regarding overall plan of care and treatment options, ensuring decisions are within the context of the patient's values and GOCs.   Questions and concerns were addressed.  The family was encouraged to call with questions or concerns.  PMT will continue to support holistically.  Conference with attending, bedside nursing staff, transition of care team related to patient condition, needs, goals of care.  HCPOA   NEXT OF KIN -daughter, [name] [name]    SUMMARY OF RECOMMENDATIONS   At this point full scope/full code Historically has declined short-term rehab Would benefit from outpatient palliative   Code Status/Advance Care Planning: Full code  Symptom Management:  Per hospitalist, no additional needs at this time.  Palliative Prophylaxis:  Frequent Pain Assessment and Oral Care  Additional Recommendations (Limitations, Scope, Preferences): Full Scope Treatment  Psycho-social/Spiritual:  Desire for further Chaplaincy support:no Additional Recommendations: Caregiving  Support/Resources and Education on Hospice  Prognosis:  Unable to determine, based on outcomes.  6 months or less would not be surprising based on chronic  illness burden, multiple hospitalization.  Discharge Planning:  To be determined.  Historically declines short-term rehab       Primary Diagnoses: Present on Admission:  Chronic kidney disease Stage IV  Type 2 diabetes mellitus with stage 4 chronic kidney disease (HCC)  Hypertension  Acute on chronic respiratory failure with hypoxia and hypercapnia (HCC)  AF (paroxysmal atrial fibrillation) ([place])   I have reviewed the medical record, interviewed the patient and family, and examined the patient. The following aspects are pertinent.  Past Medical History:  Diagnosis Date   Atrial fibrillation (HCC)    CAD (coronary artery disease)    DES to LAD June  2018   CKD (chronic kidney disease) stage 3, GFR 30-59 ml/min (HCC)    Essential hypertension    History of pneumonia    Hyperlipidemia    Hypothyroidism    LBBB (left bundle branch block)    Morbid obesity (HCC)    Non Hodgkin's lymphoma ([place])    Status post XRT and chemotherapy   NSTEMI (non-ST elevated myocardial infarction) [facility])    June 2018   Peripheral neuropathy    Pneumonia due to COVID-19 virus    Sleep apnea    Type 2 diabetes mellitus (HCC)    Social History   Socioeconomic History   Marital status: Divorced    Spouse name: Not on file   Number of children: Not on file   Years of education: Not on file   Highest education level: Not on file  Occupational History   Not on file  Tobacco Use   Smoking status: Former    Packs/day: 0.25    Years: 30.00    Pack years: 7.50    Types: Cigarettes    Quit date: 10/2019    Years since quitting: 1.3   Smokeless tobacco: Never  Vaping Use   Vaping Use: Never used  Substance and Sexual Activity   Alcohol use: No    Alcohol/week: 0.0 standard drinks   Drug use: No   Sexual activity: Yes  Other Topics Concern   Not on file  Social History Narrative   Not on file   Social Determinants of Health   Financial Resource Strain: Not on file  Food Insecurity: Not on file  Transportation Needs: Not on file  Physical Activity: Not on file  Stress: Not on file  Social Connections: Not on file   Family History  Problem Relation Age of Onset   Cancer Mother        breast and lung   Cancer Father        bladder   Cancer Maternal Uncle        prostate   Cancer Paternal Uncle        esophagus   Colon cancer Neg Hx    Scheduled Meds:  apixaban  5 mg Oral BID   bisoprolol  5 mg Oral Daily   furosemide  40 mg Intravenous Q12H   insulin aspart  0-15 Units Subcutaneous TID WC   insulin aspart  0-5 Units Subcutaneous QHS   insulin glargine  25 Units Subcutaneous QHS   isosorbide mononitrate  30 mg Oral Daily    [START ON 01/31/2021] levothyroxine  300 mcg Oral QAC breakfast   loratadine  10 mg Oral QPM   mometasone-formoterol  2 puff Inhalation BID   potassium chloride  10 mEq Oral Daily   simvastatin  5 mg Oral q1800   sodium chloride flush  3 mL Intravenous Q12H  Continuous Infusions:  sodium chloride     PRN Meds:.sodium chloride, acetaminophen **OR** acetaminophen, ipratropium-albuterol, nitroGLYCERIN, ondansetron **OR** ondansetron (ZOFRAN) IV, senna-docusate, sodium chloride flush Medications Prior to Admission:  Prior to Admission medications   Medication Sig Start Date End Date Taking? Authorizing Provider  acetaminophen (TYLENOL) 325 MG tablet Take 2 tablets (650 mg total) by mouth every 6 (six) hours as needed for mild pain (or Fever >/= 101). 02/01/20  Yes Emokpae, Courage, MD  albuterol (VENTOLIN HFA) 108 (90 Base) MCG/ACT inhaler Inhale 1-2 puffs into the lungs every 6 (six) hours as needed for wheezing or shortness of breath. 01/10/19  Yes Long, Wonda Olds, MD  apixaban (ELIQUIS) 5 MG TABS tablet Take 1 tablet (5 mg total) by mouth 2 (two) times daily. This is a dose change 01/07/21  Yes Verta Ellen., NP  aspirin EC 81 MG tablet Take 81 mg by mouth daily. Swallow whole.   Yes [provider]  bisoprolol (ZEBETA) 5 MG tablet Take 5 mg by mouth daily. 11/26/20  Yes [provider]  chlorhexidine (PERIDEX) 0.12 % solution 15 mLs by Mouth Rinse route 2 (two) times daily. 12/31/20  Yes Shahmehdi, Seyed A, MD  furosemide (LASIX) 40 MG tablet Take 1 tablet (40 mg total) by mouth 2 (two) times daily. 01/24/21  Yes Johnson, Clanford L, MD  insulin degludec (TRESIBA) 100 UNIT/ML FlexTouch Pen Inject 10 Units into the skin daily at 10 pm. Patient taking differently: Inject 56 Units into the skin daily at 10 pm. 01/24/21  Yes Johnson, Clanford L, MD  ipratropium-albuterol (DUONEB) 0.5-2.5 (3) MG/3ML SOLN Inhale 3 mLs into the lungs every 4 (four) hours as needed (shortness of breath).  05/22/20  Yes [provider]  isosorbide mononitrate (IMDUR) 30 MG 24 hr tablet Take 1 tablet (30 mg total) by mouth daily. 12/04/20  Yes Satira Sark, MD  levocetirizine (XYZAL) 5 MG tablet Take 5 mg by mouth every evening.  08/27/17  Yes [provider]  levothyroxine (SYNTHROID) 100 MCG tablet Take 100 mcg by mouth daily before breakfast. (Takes with 200 mcg tab for a total of 300 mcg once daily)   Yes [provider]  levothyroxine (SYNTHROID, LEVOTHROID) 200 MCG tablet Take 300 mcg by mouth daily before breakfast. (takes with 171mg tab for a total of 3063m)   Yes [provider]  Mouthwashes (MOUTH RINSE) LIQD solution 15 mLs by Mouth Rinse route 2 times daily at 12 noon and 4 pm. 12/31/20 01/30/21 Yes Shahmehdi, Seyed A, MD  OXYGEN Inhale 3-4 L into the lungs daily as needed (low oxygen).   Yes [provider]  potassium chloride SA (KLOR-CON) 10 MEQ tablet Take 1 tablet (10 mEq total) by mouth daily. 01/24/21  Yes Johnson, Clanford L, MD  predniSONE (DELTASONE) 20 MG tablet Take 3 PO QAM x3days, 2 PO QAM x3days, 1 PO QAM x3days Patient taking differently: Take 20 mg by mouth See admin instructions. Take 3 PO QAM x3days, 2 PO QAM x3days, 1 PO QAM x3days 01/24/21  Yes Johnson, Clanford L, MD  senna-docusate (SENOKOT-S) 8.6-50 MG tablet Take 1 tablet by mouth daily as needed for mild constipation. 02/01/20  Yes [provider]  simvastatin (ZOCOR) 5 MG tablet Take 5 mg by mouth daily. 09/14/16  Yes [provider]  SYMBICORT 160-4.5 MCG/ACT inhaler Inhale 2 puffs into the lungs daily as needed (shortness of breath). 12/22/19  Yes [provider]  nitroGLYCERIN (NITROSTAT) 0.4 MG SL tablet Place 0.4  mg under the tongue every 5 (five) minutes as needed for chest pain.  12/27/17   [provider]  insulin aspart (NOVOLOG) 100 UNIT/ML injection Inject 0-15 Units into the skin 3 (three) times daily with meals. Patient not  taking: No sig reported 12/31/20 01/22/21  Deatra James, MD   No Known Allergies Review of Systems  Unable to perform ROS: Other   Physical Exam Vitals and nursing note reviewed.  Constitutional:      General: He is not in acute distress.    Appearance: He is obese. He is ill-appearing.  Cardiovascular:     Rate and Rhythm: Normal rate.  Pulmonary:     Effort: Pulmonary effort is normal.  Musculoskeletal:     Right lower leg: Edema present.     Left lower leg: Edema present.  Skin:    General: Skin is warm and dry.  Neurological:     Mental Status: He is alert and oriented to person, place, and time.  Psychiatric:        Mood and Affect: Mood normal.        Behavior: Behavior normal.    Vital Signs: BP 133/84   Pulse 79   Temp 98.2 F (36.8 C)   Resp 13   Ht 6' 1"  (1.854 m)   Wt (!) 145.8 kg   SpO2 94%   BMI 42.41 kg/m      Pain Score: 0-No pain   SpO2: SpO2: 94 % O2 Device:SpO2: 94 % O2 Flow Rate: .O2 Flow Rate (L/min): 5 L/min  IO: Intake/output summary: No intake or output data in the 24 hours ending 01/30/21 1317  LBM:   Baseline Weight: Weight: (!) 145.8 kg Most recent weight: Weight: (!) 145.8 kg     Palliative Assessment/Data:   Flowsheet Rows    Flowsheet Row Most Recent Value  Intake Tab   Referral Department Hospitalist  Unit at Time of Referral Cardiac/Telemetry Unit  Palliative Care Primary Diagnosis Cardiac  Date Notified 01/30/21  Palliative Care Type Return patient Palliative Care  Reason for referral Clarify Goals of Care  Date of Admission 01/30/21  Date first seen by Palliative Care 01/30/21  # of days Palliative referral response time 0 Day(s)  # of days IP prior to Palliative referral 0  Clinical Assessment   Palliative Performance Scale Score 30%  Pain Max last 24 hours Not able to report  Pain Min Last 24 hours Not able to report  Dyspnea Max Last 24 Hours Not able to report  Dyspnea Min Last 24 hours Not able to  report  Psychosocial & Spiritual Assessment   Palliative Care Outcomes        Time In: 1240 Time Out: 1320 Time Total: 40 minutes Greater than 50%  of this time was spent counseling and coordinating care related to the above assessment and plan.  Signed by: Drue Novel, NP   Please contact Palliative Medicine Team phone at [phone] for questions and concerns.  For individual provider: See Shea Evans

## 2021-01-30 NOTE — Progress Notes (Signed)
Fio2 decreased from 100% to 60%

## 2021-01-30 NOTE — ED Notes (Signed)
Changed out urine canister. Pt asked at water under his oxygen stated I would ask RN

## 2021-01-30 NOTE — ED Notes (Signed)
Pt increased to 6L [place].

## 2021-01-30 NOTE — H&P (Signed)
History and Physical    [name] [name] OZD:[id number] DOB: Mar 29, 1946 DOA: 01/30/2021  Referring MD/NP/PA: Wyvonnia Dusky PCP: Nickola Major, MD Outpatient Specialists: Red Christians (nephrology) Rozann Lesches, MD (Cardiology),  Patient coming from: home  Chief Complaint: increased shortness of breath  HPI: [name] [name] is a 75 y.o. male  a history of CHF with preserved ejection fraction, CAD, atrial fibrillation Eliquis, stage 4 CKD, COPD hypertension, non-Hodgkin's lymphoma, diabetes presenting to the ED with increased shortness of breath over the past several hours. Patient recently admitted to the hospital and discharged 6 days ago for CHF and COPD exacerbation. Patient has had multiple ED presentations and admissions in the last six months for the same issue. Patient was on BiPAP and had difficulty engaging in conversation. States adherence with his medications including Lasix and denies any missed dose. States that he's breathing better with BiPAP now than he was at home. No fever or chest pain. Denies continued epistaxis. On 3-4 L O2 chronically. Denies using BiPAP/CPAP at home. Per ED notes, patient was supposed to undergo sleep study to see if he's qualified for CPAP use but has not yet done so. The history was limited by the condition of the patient.   ED Course:  Patient presenting to the ED with complaint of increased shortness of breath over the past several hours, along with orthopnea, increased lower extremity edema and chest discomfort. He was hemodynamically stable. BP 124/81 at bedside. Increased O2 demand requiring BiPAP at 15L/min satting 95%. Workup in the ED showed normal CBC except for thrombocytopenia, BUN/Cr 38/2.28 (similar to prior admissions), increased pCO2 (improved from last admission), BNP 267 slightly elevated from baseline (~200) and mildly elevated troponin, likely from demand ischemia. CXR showed cardiomegaly with pulmonary edema and small bilateral pleural  effusions with findings similar to prior imaging. IV Lasix 40mg  x1 and nebulizer meds were given. Resp panel, COVID-19, and influenza A&B negative. Hospitalist was asked to admit patient for further evaluation and management.     Review of Systems: limited due to patient's condition. Otherwise, as per HPI.   Past Medical History:  Diagnosis Date   Atrial fibrillation ([place])    CAD (coronary artery disease)    DES to LAD June 2018   CKD (chronic kidney disease) stage 3, GFR 30-59 ml/min (HCC)    Essential hypertension    History of pneumonia    Hyperlipidemia    Hypothyroidism    LBBB (left bundle branch block)    Morbid obesity ([place])    Non Hodgkin's lymphoma ([place])    Status post XRT and chemotherapy   NSTEMI (non-ST elevated myocardial infarction) [facility])    June 2018   Peripheral neuropathy    Pneumonia due to COVID-19 virus    Sleep apnea    Type 2 diabetes mellitus ([place])     Past Surgical History:  Procedure Laterality Date   CHOLECYSTECTOMY  1992   COLONOSCOPY N/A 09/03/2014   SLF:six colon polyps removed/small internal hemorrhoids   CORONARY STENT INTERVENTION N/A 01/21/2017   Procedure: Coronary Stent Intervention;  Surgeon: Nelva Bush, MD;  Location: [facility];  Service: Cardiovascular;  Laterality: N/A;   ESOPHAGOGASTRODUODENOSCOPY N/A 09/03/2014   SLF: mild gastritis/few gastric polyps   LEFT HEART CATH AND CORONARY ANGIOGRAPHY N/A 01/20/2017   Procedure: Left Heart Cath and Coronary Angiography;  Surgeon: Jettie Booze, MD;  Location: [place] [facility];  Service: Cardiovascular;  Laterality: N/A;   TOTAL KNEE ARTHROPLASTY  11/10/2011   Procedure:  TOTAL KNEE ARTHROPLASTY;  Surgeon: Mauri Pole, MD;  Location: [facility];  Service: Orthopedics;  Laterality: Right;   TOTAL KNEE ARTHROPLASTY Left 05/24/2018   Procedure: LEFT TOTAL KNEE ARTHROPLASTY;  Surgeon: Melrose Nakayama, MD;  Location: [facility];  Service: Orthopedics;  Laterality: Left;    Social  History:  reports that he quit smoking about 15 months ago. His smoking use included cigarettes. He has a 7.50 pack-year smoking history. He has never used smokeless tobacco. He reports that he does not drink alcohol and does not use drugs.  No Known Allergies  Family history reviewed  Family History  Problem Relation Age of Onset   Cancer Mother        breast and lung   Cancer Father        bladder   Cancer Maternal Uncle        prostate   Cancer Paternal Uncle        esophagus   Colon cancer Neg Hx     Prior to Admission medications   Medication Sig Start Date End Date Taking? Authorizing Provider  acetaminophen (TYLENOL) 325 MG tablet Take 2 tablets (650 mg total) by mouth every 6 (six) hours as needed for mild pain (or Fever >/= 101). 02/01/20   Roxan Hockey, MD  albuterol (VENTOLIN HFA) 108 (90 Base) MCG/ACT inhaler Inhale 1-2 puffs into the lungs every 6 (six) hours as needed for wheezing or shortness of breath. 01/10/19   Long, Wonda Olds, MD  apixaban (ELIQUIS) 5 MG TABS tablet Take 1 tablet (5 mg total) by mouth 2 (two) times daily. This is a dose change 01/07/21   Verta Ellen., NP  aspirin EC 81 MG tablet Take 81 mg by mouth daily. Swallow whole.    [provider]  bisoprolol (ZEBETA) 5 MG tablet Take 5 mg by mouth daily. 11/26/20   [provider]  chlorhexidine (PERIDEX) 0.12 % solution 15 mLs by Mouth Rinse route 2 (two) times daily. 12/31/20   Shahmehdi, Valeria Batman, MD  furosemide (LASIX) 40 MG tablet Take 1 tablet (40 mg total) by mouth 2 (two) times daily. 01/24/21   Johnson, Clanford L, MD  insulin degludec (TRESIBA) 100 UNIT/ML FlexTouch Pen Inject 10 Units into the skin daily at 10 pm. 01/24/21   Johnson, Clanford L, MD  ipratropium-albuterol (DUONEB) 0.5-2.5 (3) MG/3ML SOLN Inhale 3 mLs into the lungs every 4 (four) hours as needed (shortness of breath). 05/22/20   [provider]  isosorbide mononitrate (IMDUR) 30 MG 24 hr tablet Take 1  tablet (30 mg total) by mouth daily. 12/04/20   Satira Sark, MD  levocetirizine (XYZAL) 5 MG tablet Take 5 mg by mouth every evening.  08/27/17   [provider]  levothyroxine (SYNTHROID) 100 MCG tablet Take 100 mcg by mouth daily before breakfast. (Takes with 200 mcg tab for a total of 300 mcg once daily)    [provider]  levothyroxine (SYNTHROID, LEVOTHROID) 200 MCG tablet Take 300 mcg by mouth daily before breakfast. (takes with 185mcg tab for a total of 373mcg)    [provider]  Mouthwashes (MOUTH RINSE) LIQD solution 15 mLs by Mouth Rinse route 2 times daily at 12 noon and 4 pm. 12/31/20 01/30/21  Shahmehdi, Valeria Batman, MD  nitroGLYCERIN (NITROSTAT) 0.4 MG SL tablet Place 0.4 mg under the tongue every 5 (five) minutes as needed for chest pain.  12/27/17   [provider]  OXYGEN Inhale 3-4 L into the  lungs daily as needed (low oxygen).    [provider]  potassium chloride SA (KLOR-CON) 10 MEQ tablet Take 1 tablet (10 mEq total) by mouth daily. 01/24/21   Johnson, Clanford L, MD  predniSONE (DELTASONE) 20 MG tablet Take 3 PO QAM x3days, 2 PO QAM x3days, 1 PO QAM x3days 01/24/21   Johnson, Clanford L, MD  senna-docusate (SENOKOT-S) 8.6-50 MG tablet Take 1 tablet by mouth daily as needed for mild constipation. 02/01/20   [provider]  simvastatin (ZOCOR) 5 MG tablet Take 5 mg by mouth daily. 09/14/16   [provider]  SYMBICORT 160-4.5 MCG/ACT inhaler Inhale 2 puffs into the lungs daily as needed (shortness of breath). 12/22/19   [provider]  insulin aspart (NOVOLOG) 100 UNIT/ML injection Inject 0-15 Units into the skin 3 (three) times daily with meals. Patient not taking: No sig reported 12/31/20 01/22/21  Deatra James, MD    Physical Exam: Vitals:   01/30/21 0528 01/30/21 0530 01/30/21 0600 01/30/21 0730  BP:  (!) 149/88 124/81 (!) 140/92  Pulse:  91 91 98  Resp:   16 19  Temp:      SpO2: 100% 100% 98% 95%   Weight:      Height:          Constitutional: well-developed, obese elderly male on BiPAP  Vitals:   01/30/21 0528 01/30/21 0530 01/30/21 0600 01/30/21 0730  BP:  (!) 149/88 124/81 (!) 140/92  Pulse:  91 91 98  Resp:   16 19  Temp:      SpO2: 100% 100% 98% 95%  Weight:      Height:       HEENT: NCAT, EOMI Neck: supple, trachea medial Respiratory: No accessory muscle used. Breath sounds unable to appreciate due to BiPAP use.  Cardiovascular: Regular rate and rhythm with no rubs or gallops. No thyromegaly or JVD noted.  1+ pitting edema lower extremities. 2/4 pulses in all extremities. Abdomen: no tenderness, no masses palpated. No hepatosplenomegaly. Bowel sounds positive.  Musculoskeletal: no clubbing / cyanosis. No joint deformity upper and lower extremities. Good ROM, no contractures. Normal muscle tone. Old scar on right knee.  Skin: no rashes, lesions, ulcers. No induration Neurologic:  Alert and oriented x3. Mood is appropriate for condition and setting   Labs on Admission: I have personally reviewed following labs and imaging studies  CBC: Recent Labs  Lab 01/30/21 0312  WBC 8.4  NEUTROABS 7.5  HGB 15.3  HCT 51.0  MCV 94.4  PLT 191   Basic Metabolic Panel: Recent Labs  Lab 01/30/21 0312  NA 141  K 3.9  CL 102  CO2 31  GLUCOSE 127*  BUN 53*  CREATININE 2.36*  CALCIUM 8.9   GFR: Estimated Creatinine Clearance: 41.9 mL/min (A) (by C-G formula based on SCr of 2.36 mg/dL (H)). Liver Function Tests: No results for input(s): AST, ALT, ALKPHOS, BILITOT, PROT, ALBUMIN in the last 168 hours. No results for input(s): LIPASE, AMYLASE in the last 168 hours. No results for input(s): AMMONIA in the last 168 hours. Coagulation Profile: No results for input(s): INR, PROTIME in the last 168 hours. Cardiac Enzymes: No results for input(s): CKTOTAL, CKMB, CKMBINDEX, TROPONINI in the last 168 hours. BNP (last 3 results) No results for input(s): PROBNP in the last  8760 hours. HbA1C: No results for input(s): HGBA1C in the last 72 hours. CBG: No results for input(s): GLUCAP in the last 168 hours. Lipid Profile: No results for input(s): CHOL, HDL,  LDLCALC, TRIG, CHOLHDL, LDLDIRECT in the last 72 hours. Thyroid Function Tests: No results for input(s): TSH, T4TOTAL, FREET4, T3FREE, THYROIDAB in the last 72 hours. Anemia Panel: No results for input(s): VITAMINB12, FOLATE, FERRITIN, TIBC, IRON, RETICCTPCT in the last 72 hours. Urine analysis:    Component Value Date/Time   COLORURINE YELLOW 11/19/2020 1736   APPEARANCEUR CLEAR 11/19/2020 1736   LABSPEC 1.015 11/19/2020 1736   PHURINE 6.0 11/19/2020 1736   GLUCOSEU NEGATIVE 11/19/2020 1736   HGBUR NEGATIVE 11/19/2020 1736   BILIRUBINUR NEGATIVE 11/19/2020 1736   KETONESUR NEGATIVE 11/19/2020 1736   PROTEINUR >=300 (A) 11/19/2020 1736   UROBILINOGEN 4.0 (H) 07/23/2014 1356   NITRITE NEGATIVE 11/19/2020 1736   LEUKOCYTESUR NEGATIVE 11/19/2020 1736   Sepsis Labs: @LABRCNTIP (procalcitonin:4,lacticidven:4) ) Recent Results (from the past 240 hour(s))  Resp Panel by RT-PCR (Flu A&B, Covid) Nasopharyngeal Swab     Status: None   Collection Time: 01/23/21  2:36 PM   Specimen: Nasopharyngeal Swab; Nasopharyngeal(NP) swabs in vial transport medium  Result Value Ref Range Status   SARS Coronavirus 2 by RT PCR NEGATIVE NEGATIVE Final    Comment: (NOTE) SARS-CoV-2 target nucleic acids are NOT DETECTED.  The SARS-CoV-2 RNA is generally detectable in upper respiratory specimens during the acute phase of infection. The lowest concentration of SARS-CoV-2 viral copies this assay can detect is 138 copies/mL. A negative result does not preclude SARS-Cov-2 infection and should not be used as the sole basis for treatment or other patient management decisions. A negative result may occur with  improper specimen collection/handling, submission of specimen other than nasopharyngeal swab, presence of viral  mutation(s) within the areas targeted by this assay, and inadequate number of viral copies(<138 copies/mL). A negative result must be combined with clinical observations, patient history, and epidemiological information. The expected result is Negative.  Fact Sheet for Patients:  EntrepreneurPulse.com.au  Fact Sheet for Healthcare Providers:  IncredibleEmployment.be  This test is no t yet approved or cleared by the [place] FDA and  has been authorized for detection and/or diagnosis of SARS-CoV-2 by FDA under an Emergency Use Authorization (EUA). This EUA will remain  in effect (meaning this test can be used) for the duration of the COVID-19 declaration under Section 564(b)(1) of the Act, 21 U.S.C.section 360bbb-3(b)(1), unless the authorization is terminated  or revoked sooner.       Influenza A by PCR NEGATIVE NEGATIVE Final   Influenza B by PCR NEGATIVE NEGATIVE Final    Comment: (NOTE) The Xpert Xpress SARS-CoV-2/FLU/RSV plus assay is intended as an aid in the diagnosis of influenza from Nasopharyngeal swab specimens and should not be used as a sole basis for treatment. Nasal washings and aspirates are unacceptable for Xpert Xpress SARS-CoV-2/FLU/RSV testing.  Fact Sheet for Patients: EntrepreneurPulse.com.au  Fact Sheet for Healthcare Providers: IncredibleEmployment.be  This test is not yet approved or cleared by the [place] FDA and has been authorized for detection and/or diagnosis of SARS-CoV-2 by FDA under an Emergency Use Authorization (EUA). This EUA will remain in effect (meaning this test can be used) for the duration of the COVID-19 declaration under Section 564(b)(1) of the Act, 21 U.S.C. section 360bbb-3(b)(1), unless the authorization is terminated or revoked.  Performed at [facility], [address] [place], [place] [address]   Resp Panel by RT-PCR (Flu A&B, Covid)  Nasopharyngeal Swab     Status: None   Collection Time: 01/30/21  2:21 AM   Specimen: Nasopharyngeal Swab; Nasopharyngeal(NP) swabs in vial transport medium  Result  Value Ref Range Status   SARS Coronavirus 2 by RT PCR NEGATIVE NEGATIVE Final    Comment: (NOTE) SARS-CoV-2 target nucleic acids are NOT DETECTED.  The SARS-CoV-2 RNA is generally detectable in upper respiratory specimens during the acute phase of infection. The lowest concentration of SARS-CoV-2 viral copies this assay can detect is 138 copies/mL. A negative result does not preclude SARS-Cov-2 infection and should not be used as the sole basis for treatment or other patient management decisions. A negative result may occur with  improper specimen collection/handling, submission of specimen other than nasopharyngeal swab, presence of viral mutation(s) within the areas targeted by this assay, and inadequate number of viral copies(<138 copies/mL). A negative result must be combined with clinical observations, patient history, and epidemiological information. The expected result is Negative.  Fact Sheet for Patients:  EntrepreneurPulse.com.au  Fact Sheet for Healthcare Providers:  IncredibleEmployment.be  This test is no t yet approved or cleared by the [place] FDA and  has been authorized for detection and/or diagnosis of SARS-CoV-2 by FDA under an Emergency Use Authorization (EUA). This EUA will remain  in effect (meaning this test can be used) for the duration of the COVID-19 declaration under Section 564(b)(1) of the Act, 21 U.S.C.section 360bbb-3(b)(1), unless the authorization is terminated  or revoked sooner.       Influenza A by PCR NEGATIVE NEGATIVE Final   Influenza B by PCR NEGATIVE NEGATIVE Final    Comment: (NOTE) The Xpert Xpress SARS-CoV-2/FLU/RSV plus assay is intended as an aid in the diagnosis of influenza from Nasopharyngeal swab specimens and should not be  used as a sole basis for treatment. Nasal washings and aspirates are unacceptable for Xpert Xpress SARS-CoV-2/FLU/RSV testing.  Fact Sheet for Patients: EntrepreneurPulse.com.au  Fact Sheet for Healthcare Providers: IncredibleEmployment.be  This test is not yet approved or cleared by the [place] FDA and has been authorized for detection and/or diagnosis of SARS-CoV-2 by FDA under an Emergency Use Authorization (EUA). This EUA will remain in effect (meaning this test can be used) for the duration of the COVID-19 declaration under Section 564(b)(1) of the Act, 21 U.S.C. section 360bbb-3(b)(1), unless the authorization is terminated or revoked.  Performed at [facility], [address] [place], [place] [address]      Radiological Exams on Admission: DG Chest Portable 1 View  Result Date: 01/30/2021 CLINICAL DATA:  Dyspnea EXAM: PORTABLE CHEST 1 VIEW COMPARISON:  01/19/2021 FINDINGS: Lung volumes are small, but are stable since prior examination. Small bilateral pleural effusions are present. Trace bilateral perihilar interstitial pulmonary infiltrate is present, likely cardiogenic in nature. No pneumothorax. Mild cardiomegaly is stable. No acute bone abnormality IMPRESSION: Mild cardiogenic failure with small bilateral pleural effusions. Electronically Signed   By: Fidela Salisbury MD   On: 01/30/2021 02:47     Assessment/Plan Active Problems:   Chronic kidney disease Stage IV   Type 2 diabetes mellitus with stage 4 chronic kidney disease (HCC)   Hypertension   Acute on chronic respiratory failure with hypoxia and hypercapnia (HCC)   AF (paroxysmal atrial fibrillation) ([place])  Present on Admission:   Acute on chronic diastolic CHF (congestive heart failure) (HCC) Acute on chronic respiratory failure Hypothyroidism Chronic kidney disease Stage IV Paroxysmal atrial fibrillation  Type 2 DM  Acute on chronic diastolic CHF - patient  presenting with orthopnea, increased O2 demand found to have slightly elevated BNP suggestive of CHF exacerbation. Patient been admitted for the same condition twice in the past month. Appears to have  difficulty adhering to treatment plans including increasing home Lasix dosage or going to rehab as recommended. Was not seen by Cardiology last few admission , will  - Last echo 5/28 demonstrates EF 55-60% - IV Lasix 40mg  BID - Continue home IMDUR 30mg  qd, statin 5mg   - I&O, CMP, Weight daily  - Cardiology consult given repeated admissions - Palliative consult to discuss goals of care  Acute on chronic respiratory failure - secondary to CHF and COPD. Patient desat to 80% on home O2 4-6L in the ED. Currently satting 95% on BiPAP with FIO2 60%. Afebrile, normal WBC, negative procalcitonin ruling against pneumonia.  - Continue BiPAP FiO2 60%, IPAP 16, EPAP 8, rate 15, rise 3. - Continue home inhalers  Chronic Atrial fibrillation - regular rate at admission.  - Continue home eliquis for anticoagulation  Diabetes mellitus - BG upon admission 127. A1c 5.8%.  - SSI  Chronic kidney disease stage IV - BUN/Cr stable (53/2.36). No electrolyte abnormalities - BMP daily  Hypothyroidism - last TSH 3/6 1.681 - Continue home levothyroxine   Addendum:  Patient seen and evaluated at bedside.  Appears to have recurrent acute on chronic diastolic CHF exacerbation with associated acute on chronic respiratory failure.  He is currently on BiPAP.  No sign of pneumonia otherwise noted.  Continue home inhalers for COPD management as needed.  [place] cardiology consultation due to recurrent admissions despite medication compliance.  Appreciate palliative consultation for goals of care.  Likely will require SNF on discharge.  He did not go to SNF last time as it was located too far away from home.  DVT prophylaxis: Eliquis Code Status: Full code Family Communication: Will call Disposition Plan: rehab Consults  called: Cardiology, Palliative Admission status: inpatient  Severity of Illness: The appropriate patient status for this patient is INPATIENT. Inpatient status is judged to be reasonable and necessary in order to provide the required intensity of service to ensure the patient's safety. The patient's presenting symptoms, physical exam findings, and initial radiographic and laboratory data in the context of their chronic comorbidities is felt to place them at high risk for further clinical deterioration. Furthermore, it is not anticipated that the patient will be medically stable for discharge from the hospital within 2 midnights of admission. The following factors support the patient status of inpatient.   " The patient's presenting symptoms include shortness of breath " The worrisome physical exam findings include  " The initial radiographic and laboratory data are worrisome because of pleural effusion " The chronic co-morbidities include acute on chronic CHF, acute on chronic respiratory failure   * I certify that at the point of admission it is my clinical judgment that the patient will require inpatient hospital care spanning beyond 2 midnights from the point of admission due to high intensity of service, high risk for further deterioration and high frequency of surveillance required.Fredrik Rigger MS4 [facility] Pager [phone]  If 7PM-7AM, please contact night-coverage [email or web address] Password [facility]  01/30/2021, 8:17 AM

## 2021-01-30 NOTE — ED Provider Notes (Signed)
[facility] EMERGENCY DEPARTMENT Provider Note   CSN: [id number] Arrival date & time: 01/30/21  0210     History Chief Complaint  Patient presents with   Shortness of Breath    [name] [name] is a 75 y.o. male.  Patient with a history of CHF with preserved ejection fraction, CAD, atrial fibrillation Eliquis, CKD, hypertension, non-Hodgkin's lymphoma, diabetes presenting with increased shortness of breath over the past several hours.  Patient recently admitted to the hospital and discharged 6 days ago after CHF and COPD exacerbation.  States compliance with his medications including his Lasix.  Denies any missed doses.  States he became increasingly short of breath this evening with unable to lie flat, increased swelling in his legs, tightness in his chest and productive cough.  No fever or chest pain.  Has had intermittent right-sided nosebleed on and off all day today.  Denies any significant change in his leg swelling.  He is on 3 to 4 L at home chronically.  He states he is supposed to have a sleep study to see if he needs CPAP but he does not have this yet. States he feels like his right side of his nose is stopped up and that is why he cannot breathe.  He tried to "open it up" at home and that is when his nose started bleeding Notably declined admission to skilled nursing facility during his last admission.  The history is provided by the patient and the EMS personnel. The history is limited by the condition of the patient.  Shortness of Breath Associated symptoms: cough   Associated symptoms: no abdominal pain, no chest pain, no fever, no headaches and no vomiting       Past Medical History:  Diagnosis Date   Atrial fibrillation (HCC)    CAD (coronary artery disease)    DES to LAD June 2018   CKD (chronic kidney disease) stage 3, GFR 30-59 ml/min (HCC)    Essential hypertension    History of pneumonia    Hyperlipidemia    Hypothyroidism    LBBB (left bundle branch block)     Morbid obesity ([place])    Non Hodgkin's lymphoma ([place])    Status post XRT and chemotherapy   NSTEMI (non-ST elevated myocardial infarction) [facility])    June 2018   Peripheral neuropathy    Pneumonia due to COVID-19 virus    Sleep apnea    Type 2 diabetes mellitus [facility])     Patient Active Problem List   Diagnosis Date Noted   Acute on chronic respiratory failure with hypercapnia (HCC)    CKD (chronic kidney disease) stage 4, GFR 15-29 ml/min ([place]) 01/21/2021   Pleural effusion 01/20/2021   Thrombocytopenia ([place]) 01/20/2021   Elevated d-dimer 01/20/2021   Atrial fibrillation, chronic ([place]) 01/20/2021   CHF exacerbation ([place]) 12/28/2020   CHF (congestive heart failure) ([place]) 12/28/2020   Acute on chronic diastolic HF (heart failure) ([place]) 10/06/2020   Chronic respiratory failure with hypoxia and hypercapnia ([place]) 09/10/2020   Former smoker 08/10/2020   COPD (chronic obstructive pulmonary disease) ([place]) 08/10/2020   Acute on chronic diastolic CHF (congestive heart failure) ([place]) 08/05/2020   Acute respiratory failure with hypoxia ([place]) 08/04/2020   Acute exacerbation of CHF (congestive heart failure) ([place]) 07/19/2020   AF (paroxysmal atrial fibrillation) ([place]) 07/19/2020   COVID-19    Acute on chronic respiratory failure with hypoxia and hypercapnia ([place]) 06/25/2020   Aspiration pneumonia ([place]) 06/24/2020   Pneumonia due to COVID-19 virus 06/24/2020  Confusion    Small bowel obstruction ([place]) 01/29/2020   SBO (small bowel obstruction) ([place]) 01/28/2020   Primary osteoarthritis of left knee 05/24/2018   Primary localized osteoarthritis of left knee 05/19/2018   Acute on chronic systolic and diastolic heart failure, NYHA class 1 ([place]) 04/01/2017   CAD (coronary artery disease) 04/01/2017   DOE (dyspnea on exertion) 03/21/2017   HTN (hypertension) 03/21/2017   NSTEMI (non-ST elevated myocardial infarction) ([place]) 01/19/2017   NSTEMI, initial episode of care ([place]) 01/19/2017   Sepsis  ([place]) 07/24/2016   Elevated troponin 07/24/2016   Fever 07/24/2016   Lactic acidosis 07/24/2016   Adjustment insomnia 05/18/2016   Erectile dysfunction 12/19/2015   Sleep apnea 09/30/2015   Osteoarthritis 09/30/2015   Hypothyroidism 09/30/2015   Hypogonadism in male 09/30/2015   Diabetic neuropathy ([place]) 09/30/2015   Chronic pain of left knee 09/30/2015   Benign essential hypertension 09/30/2015   Acute on chronic renal failure ([place]) 05/14/2015   Diarrhea 05/14/2015   Dehydration 05/14/2015   Hypokalemia 05/14/2015   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   Pelvic mass in male    Varices, esophageal (HCC)    Colonic mass    Abnormal CT scan, pelvis    Bladder mass    Elevated liver enzymes    Elevated LFTs    Abdominal pain 07/23/2014   Chronic kidney disease Stage IV 07/23/2014   Morbid obesity ([place]) 07/23/2014   Type 2 diabetes mellitus with stage 4 chronic kidney disease ([place]) 07/23/2014   Hypertension 07/23/2014   S/P total knee replacement, 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    Past Surgical History:  Procedure Laterality Date   CHOLECYSTECTOMY  1992   COLONOSCOPY N/A 09/03/2014   SLF:six colon polyps removed/small internal hemorrhoids   CORONARY STENT INTERVENTION N/A 01/21/2017   Procedure: Coronary Stent Intervention;  Surgeon: Nelva Bush, MD;  Location: [facility];  Service: Cardiovascular;  Laterality: N/A;   ESOPHAGOGASTRODUODENOSCOPY N/A 09/03/2014   SLF: mild gastritis/few gastric polyps   LEFT HEART CATH AND CORONARY ANGIOGRAPHY N/A 01/20/2017   Procedure: Left Heart Cath and Coronary Angiography;  Surgeon: Jettie Booze, MD;  Location: [facility];  Service: Cardiovascular;  Laterality: N/A;   TOTAL KNEE ARTHROPLASTY  11/10/2011   Procedure: TOTAL KNEE ARTHROPLASTY;  Surgeon: Mauri Pole, MD;  Location: [facility];  Service: Orthopedics;  Laterality: Right;   TOTAL KNEE ARTHROPLASTY Left 05/24/2018   Procedure: LEFT TOTAL KNEE ARTHROPLASTY;  Surgeon:  Melrose Nakayama, MD;  Location: [facility];  Service: Orthopedics;  Laterality: Left;       Family History  Problem Relation Age of Onset   Cancer Mother        breast and lung   Cancer Father        bladder   Cancer Maternal Uncle        prostate   Cancer Paternal Uncle        esophagus   Colon cancer Neg Hx     Social History   Tobacco Use   Smoking status: Former    Packs/day: 0.25    Years: 30.00    Pack years: 7.50    Types: Cigarettes    Quit date: 10/2019    Years since quitting: 1.3   Smokeless tobacco: Never  Vaping Use   Vaping Use: Never used  Substance Use Topics   Alcohol use: No    Alcohol/week: 0.0 standard drinks   Drug use: No    Home Medications Prior to Admission medications  Medication Sig Start Date End Date Taking? Authorizing Provider  acetaminophen (TYLENOL) 325 MG tablet Take 2 tablets (650 mg total) by mouth every 6 (six) hours as needed for mild pain (or Fever >/= 101). 02/01/20   Roxan Hockey, MD  albuterol (VENTOLIN HFA) 108 (90 Base) MCG/ACT inhaler Inhale 1-2 puffs into the lungs every 6 (six) hours as needed for wheezing or shortness of breath. 01/10/19   Long, Wonda Olds, MD  apixaban (ELIQUIS) 5 MG TABS tablet Take 1 tablet (5 mg total) by mouth 2 (two) times daily. This is a dose change 01/07/21   Verta Ellen., NP  aspirin EC 81 MG tablet Take 81 mg by mouth daily. Swallow whole.    [provider]  bisoprolol (ZEBETA) 5 MG tablet Take 5 mg by mouth daily. 11/26/20   [provider]  chlorhexidine (PERIDEX) 0.12 % solution 15 mLs by Mouth Rinse route 2 (two) times daily. 12/31/20   Shahmehdi, Valeria Batman, MD  furosemide (LASIX) 40 MG tablet Take 1 tablet (40 mg total) by mouth 2 (two) times daily. 01/24/21   Johnson, Clanford L, MD  insulin degludec (TRESIBA) 100 UNIT/ML FlexTouch Pen Inject 10 Units into the skin daily at 10 pm. 01/24/21   Johnson, Clanford L, MD  ipratropium-albuterol (DUONEB) 0.5-2.5 (3) MG/3ML SOLN Inhale  3 mLs into the lungs every 4 (four) hours as needed (shortness of breath). 05/22/20   [provider]  isosorbide mononitrate (IMDUR) 30 MG 24 hr tablet Take 1 tablet (30 mg total) by mouth daily. 12/04/20   Satira Sark, MD  levocetirizine (XYZAL) 5 MG tablet Take 5 mg by mouth every evening.  08/27/17   [provider]  levothyroxine (SYNTHROID) 100 MCG tablet Take 100 mcg by mouth daily before breakfast. (Takes with 200 mcg tab for a total of 300 mcg once daily)    [provider]  levothyroxine (SYNTHROID, LEVOTHROID) 200 MCG tablet Take 300 mcg by mouth daily before breakfast. (takes with 165mcg tab for a total of 350mcg)    [provider]  Mouthwashes (MOUTH RINSE) LIQD solution 15 mLs by Mouth Rinse route 2 times daily at 12 noon and 4 pm. 12/31/20 01/30/21  Shahmehdi, Valeria Batman, MD  nitroGLYCERIN (NITROSTAT) 0.4 MG SL tablet Place 0.4 mg under the tongue every 5 (five) minutes as needed for chest pain.  12/27/17   [provider]  OXYGEN Inhale 3-4 L into the lungs daily as needed (low oxygen).    [provider]  potassium chloride SA (KLOR-CON) 10 MEQ tablet Take 1 tablet (10 mEq total) by mouth daily. 01/24/21   Johnson, Clanford L, MD  predniSONE (DELTASONE) 20 MG tablet Take 3 PO QAM x3days, 2 PO QAM x3days, 1 PO QAM x3days 01/24/21   Johnson, Clanford L, MD  senna-docusate (SENOKOT-S) 8.6-50 MG tablet Take 1 tablet by mouth daily as needed for mild constipation. 02/01/20   [provider]  simvastatin (ZOCOR) 5 MG tablet Take 5 mg by mouth daily. 09/14/16   [provider]  SYMBICORT 160-4.5 MCG/ACT inhaler Inhale 2 puffs into the lungs daily as needed (shortness of breath). 12/22/19   [provider]  insulin aspart (NOVOLOG) 100 UNIT/ML injection Inject 0-15 Units into the skin 3 (three) times daily with meals. Patient not taking: No sig reported 12/31/20 01/22/21  Deatra James, MD    Allergies    Patient  has no known allergies.  Review of Systems   Review of Systems  Constitutional:  Negative for activity change, appetite change and fever.  HENT:  Positive for congestion.   Respiratory:  Positive for cough, chest tightness and shortness of breath.   Cardiovascular:  Positive for leg swelling. Negative for chest pain.  Gastrointestinal:  Negative for abdominal pain, nausea and vomiting.  Genitourinary:  Negative for dysuria and hematuria.  Musculoskeletal:  Negative for arthralgias and myalgias.  Skin:  Negative for wound.  Neurological:  Negative for dizziness, weakness and headaches.   all other systems are negative except as noted in the HPI and PMH.   Physical Exam Updated Vital Signs BP (!) 159/102 (BP Location: Left Arm)   Pulse (!) 102   Temp 98.2 F (36.8 C)   Resp (!) 22   Ht 6\' 1"  (1.854 m)   Wt (!) 145.8 kg   SpO2 91%   BMI 42.41 kg/m   Physical Exam Vitals and nursing note reviewed.  Constitutional:      General: He is in acute distress.     Appearance: He is well-developed. He is obese. He is ill-appearing.     Comments: Chronically ill-appearing, dyspneic with conversation  HENT:     Head: Normocephalic and atraumatic.     Nose:     Comments: Dried blood in right nare, no active bleeding    Mouth/Throat:     Pharynx: No oropharyngeal exudate.  Eyes:     Conjunctiva/sclera: Conjunctivae normal.     Pupils: Pupils are equal, round, and reactive to light.  Neck:     Comments: No meningismus. Cardiovascular:     Rate and Rhythm: Tachycardia present. Rhythm irregular.     Heart sounds: Normal heart sounds. No murmur heard. Pulmonary:     Breath sounds: Wheezing present.     Comments: Diminished breath sounds with expiratory wheezing and crackles throughout Abdominal:     Palpations: Abdomen is soft.     Tenderness: There is no abdominal tenderness. There is no guarding or rebound.  Musculoskeletal:        General: No tenderness. Normal range of motion.      Cervical back: Normal range of motion and neck supple.     Right lower leg: Edema present.     Left lower leg: Edema present.  Skin:    General: Skin is warm.  Neurological:     Mental Status: He is alert and oriented to person, place, and time.     Cranial Nerves: No cranial nerve deficit.     Motor: No abnormal muscle tone.     Coordination: Coordination normal.     Comments: No ataxia on finger to nose bilaterally. No pronator drift. 5/5 strength throughout. CN 2-12 intact.Equal grip strength. Sensation intact.   Psychiatric:        Behavior: Behavior normal.    ED Results / Procedures / Treatments   Labs (all labs ordered are listed, but only abnormal results are displayed) Labs Reviewed  CBC WITH DIFFERENTIAL/PLATELET - Abnormal; Notable for the following components:      Result Value   RDW 17.5 (*)    Lymphs Abs 0.6 (*)    All other components within normal limits  BASIC METABOLIC PANEL - Abnormal; Notable for the following components:   Glucose, Bld 127 (*)    BUN 53 (*)    Creatinine, Ser 2.36 (*)    GFR, Estimated 28 (*)    All other components within normal limits  BRAIN NATRIURETIC PEPTIDE - Abnormal; Notable for the following components:  B Natriuretic Peptide 267.0 (*)    All other components within normal limits  BLOOD GAS, ARTERIAL - Abnormal; Notable for the following components:   pCO2 arterial 50.8 (*)    pO2, Arterial 51.8 (*)    Bicarbonate 29.1 (*)    Acid-Base Excess 6.6 (*)    All other components within normal limits  TROPONIN I (HIGH SENSITIVITY) - Abnormal; Notable for the following components:   Troponin I (High Sensitivity) 41 (*)    All other components within normal limits  RESP PANEL BY RT-PCR (FLU A&B, COVID) [id number]  TROPONIN I (HIGH SENSITIVITY)    EKG EKG Interpretation  Date/Time:  Thursday January 30 2021 02:20:22 EDT Ventricular Rate:  122 PR Interval:    QRS Duration: 138 QT Interval:  357 QTC Calculation: 509 R  Axis:   -73 Text Interpretation: Atrial fibrillation Nonspecific IVCD with LAD Inferior infarct, old Probable anteroseptal infarct, recent Baseline wander in lead(s) II Rate faster Confirmed by Ezequiel Essex [phone]) on 01/30/2021 2:29:26 AM  Radiology DG Chest Portable 1 View  Result Date: 01/30/2021 CLINICAL DATA:  Dyspnea EXAM: PORTABLE CHEST 1 VIEW COMPARISON:  01/19/2021 FINDINGS: Lung volumes are small, but are stable since prior examination. Small bilateral pleural effusions are present. Trace bilateral perihilar interstitial pulmonary infiltrate is present, likely cardiogenic in nature. No pneumothorax. Mild cardiomegaly is stable. No acute bone abnormality IMPRESSION: Mild cardiogenic failure with small bilateral pleural effusions. Electronically Signed   By: Fidela Salisbury MD   On: 01/30/2021 02:47    Procedures .Critical Care  Date/Time: 01/30/2021 4:57 AM Performed by: Ezequiel Essex, MD Authorized by: Ezequiel Essex, MD   Critical care provider statement:    Critical care time (minutes):  45   Critical care was necessary to treat or prevent imminent or life-threatening deterioration of the following conditions:  Respiratory failure   Critical care was time spent personally by me on the following activities:  Discussions with consultants, evaluation of patient's response to treatment, examination of patient, ordering and performing treatments and interventions, ordering and review of laboratory studies, ordering and review of radiographic studies, pulse oximetry, re-evaluation of patient's condition, obtaining history from patient or surrogate and review of old charts   Medications Ordered in ED Medications  furosemide (LASIX) injection 40 mg (has no administration in time range)  albuterol (VENTOLIN HFA) 108 (90 Base) MCG/ACT inhaler 4 puff (has no administration in time range)  magnesium sulfate IVPB 2 g 50 mL (has no administration in time range)    ED Course  I have  reviewed the triage vital signs and the nursing notes.  Pertinent labs & imaging results that were available during my care of the patient were reviewed by me and considered in my medical decision making (see chart for details).    MDM Rules/Calculators/A&P                         Respiratory distress with history of COPD and CHF with recent admission for same.  No hypoxia on his home oxygen.  Does have wheezing and crackles throughout.  EKG with A. fib with RVR and left bundle branch block, unchanged.  We will treat for both COPD and CHF exacerbation with steroids, bronchodilators, and wheezing on Lasix.  Oxygen requirement worsening.  Patient desaturated to mid 80s on 4 and 6 L.  Transition to BiPAP.  ABG without significant CO2 retention.  Suspect likely CHF as well as COPD exacerbation causing his hypoxic respiratory  failure.  COVID testing is negative.  Creatinine 2.36 which appears to be improved from his baseline in the 2.5-3 range. Troponin and BNP minimally elevated as previously.  Will need admission given his worsening oxygen requirement need for BiPAP. Discussed with Dr. Josephine Cables. Final Clinical Impression(s) / ED Diagnoses Final diagnoses:  Acute on chronic respiratory failure with hypercapnia (HCC)  Acute on chronic diastolic congestive heart failure [facility])    Rx / DC Orders ED Discharge Orders     None        Elanora Quin, Annie Main, MD 01/30/21 [phone]

## 2021-01-30 NOTE — Plan of Care (Signed)

## 2021-01-30 NOTE — ED Triage Notes (Signed)
Pt arrives EMS from home with c/o SOB. Pt given 2 breathing treatments, and 125 solu-medrol by EMS. Pt on 4L Lithium chronically.

## 2021-01-30 NOTE — Consult Note (Signed)
Cardiology Consultation:   Patient ID: [name] [name] MRN: [id number]; DOB: Jul 21, 1946  Admit date: 01/30/2021 Date of Consult: 01/30/2021  PCP:  Nickola Major, MD   [facility] Providers Cardiologist:  Rozann Lesches, MD        Patient Profile:   [name] [name] is a 75 y.o. male with a hx of HFpEF, chronic atrial fibrillation, CAD (DES to LAD 2018), chronic respiratory failure (on 3L O2 via [place]), non-Hodgkin's lymphoma, hypothyroidism, hyperlipidemia, essential hypertension, CKD stage IV, past smoker and obesity (? OSA) who is being seen 01/30/2021 for the evaluation of worsening DOE at the request of Watkins, Tobe Sos, DO.  History of Present Illness:   Mr. Sparling presents via EMS with worsening shortness of breath. He was just in the hospital (from 01/19/2021 to 01/24/2021) for similar complaints. Also treated in the ED on 01/14/2021 and prior to that was admitted from 12/28/2020 to 01/02/2021 for CHF & COPD exacerbation. The patient also reports a COVID 19 infection in November 2021 and he has had persistent exertional dyspnea since then.   Unclear if patient got his prescriptions from the pharmacy after being d/c'd from the hospital recently. Has had worsening SOB and difficulty lying flat. He denies any chest pain. Limited information obtained from patient since he is currently on BiPAP and somnolent.  In the ED initial vital signs were: 159/102 mmHg, HR 111 bpm and RR 24 Current Tele: HR 80s, AFIB Labs were: Na 141, K 3.9, gluc 127, BUN 53, Cr 2.36, WBC 8.4, hgb 15.3, Plt 164 Hs-Trops 41, 47 BNP 267 CXR with small bilateral pleural effusions. ECG from 01/30/2021 (at 02:20) showed AFIB, rate 122 bpm and LBBBB  He was treated with nebulizers, BiPAP and IV lasix.     Past Medical History:  Diagnosis Date   Atrial fibrillation ([place])    CAD (coronary artery disease)    DES to LAD June 2018   CKD (chronic kidney disease) stage 3, GFR 30-59 ml/min (HCC)    Essential hypertension     History of pneumonia    Hyperlipidemia    Hypothyroidism    LBBB (left bundle branch block)    Morbid obesity ([place])    Non Hodgkin's lymphoma ([place])    Status post XRT and chemotherapy   NSTEMI (non-ST elevated myocardial infarction) [facility])    June 2018   Peripheral neuropathy    Pneumonia due to COVID-19 virus    Sleep apnea    Type 2 diabetes mellitus ([place])     Past Surgical History:  Procedure Laterality Date   CHOLECYSTECTOMY  1992   COLONOSCOPY N/A 09/03/2014   SLF:six colon polyps removed/small internal hemorrhoids   CORONARY STENT INTERVENTION N/A 01/21/2017   Procedure: Coronary Stent Intervention;  Surgeon: Nelva Bush, MD;  Location: [place] [facility];  Service: Cardiovascular;  Laterality: N/A;   ESOPHAGOGASTRODUODENOSCOPY N/A 09/03/2014   SLF: mild gastritis/few gastric polyps   LEFT HEART CATH AND CORONARY ANGIOGRAPHY N/A 01/20/2017   Procedure: Left Heart Cath and Coronary Angiography;  Surgeon: Jettie Booze, MD;  Location: [place] [facility];  Service: Cardiovascular;  Laterality: N/A;   TOTAL KNEE ARTHROPLASTY  11/10/2011   Procedure: TOTAL KNEE ARTHROPLASTY;  Surgeon: Mauri Pole, MD;  Location: [facility];  Service: Orthopedics;  Laterality: Right;   TOTAL KNEE ARTHROPLASTY Left 05/24/2018   Procedure: LEFT TOTAL KNEE ARTHROPLASTY;  Surgeon: Melrose Nakayama, MD;  Location: [facility];  Service: Orthopedics;  Laterality: Left;     Home Medications:  Prior to Admission medications   Medication Sig Start Date End Date Taking? Authorizing Provider  acetaminophen (TYLENOL) 325 MG tablet Take 2 tablets (650 mg total) by mouth every 6 (six) hours as needed for mild pain (or Fever >/= 101). 02/01/20  Yes Emokpae, Courage, MD  albuterol (VENTOLIN HFA) 108 (90 Base) MCG/ACT inhaler Inhale 1-2 puffs into the lungs every 6 (six) hours as needed for wheezing or shortness of breath. 01/10/19  Yes Long, Wonda Olds, MD  apixaban (ELIQUIS) 5 MG TABS tablet Take 1 tablet (5 mg total)  by mouth 2 (two) times daily. This is a dose change 01/07/21  Yes Verta Ellen., NP  aspirin EC 81 MG tablet Take 81 mg by mouth daily. Swallow whole.   Yes [provider]  bisoprolol (ZEBETA) 5 MG tablet Take 5 mg by mouth daily. 11/26/20  Yes [provider]  chlorhexidine (PERIDEX) 0.12 % solution 15 mLs by Mouth Rinse route 2 (two) times daily. 12/31/20  Yes Shahmehdi, Seyed A, MD  furosemide (LASIX) 40 MG tablet Take 1 tablet (40 mg total) by mouth 2 (two) times daily. 01/24/21  Yes Johnson, Clanford L, MD  insulin degludec (TRESIBA) 100 UNIT/ML FlexTouch Pen Inject 10 Units into the skin daily at 10 pm. Patient taking differently: Inject 56 Units into the skin daily at 10 pm. 01/24/21  Yes Johnson, Clanford L, MD  ipratropium-albuterol (DUONEB) 0.5-2.5 (3) MG/3ML SOLN Inhale 3 mLs into the lungs every 4 (four) hours as needed (shortness of breath). 05/22/20  Yes [provider]  isosorbide mononitrate (IMDUR) 30 MG 24 hr tablet Take 1 tablet (30 mg total) by mouth daily. 12/04/20  Yes Satira Sark, MD  levocetirizine (XYZAL) 5 MG tablet Take 5 mg by mouth every evening.  08/27/17  Yes [provider]  levothyroxine (SYNTHROID) 100 MCG tablet Take 100 mcg by mouth daily before breakfast. (Takes with 200 mcg tab for a total of 300 mcg once daily)   Yes [provider]  levothyroxine (SYNTHROID, LEVOTHROID) 200 MCG tablet Take 300 mcg by mouth daily before breakfast. (takes with 177mcg tab for a total of 3106mcg)   Yes [provider]  Mouthwashes (MOUTH RINSE) LIQD solution 15 mLs by Mouth Rinse route 2 times daily at 12 noon and 4 pm. 12/31/20 01/30/21 Yes Shahmehdi, Seyed A, MD  OXYGEN Inhale 3-4 L into the lungs daily as needed (low oxygen).   Yes [provider]  potassium chloride SA (KLOR-CON) 10 MEQ tablet Take 1 tablet (10 mEq total) by mouth daily. 01/24/21  Yes Johnson, Clanford L, MD  predniSONE (DELTASONE) 20 MG tablet Take  3 PO QAM x3days, 2 PO QAM x3days, 1 PO QAM x3days Patient taking differently: Take 20 mg by mouth See admin instructions. Take 3 PO QAM x3days, 2 PO QAM x3days, 1 PO QAM x3days 01/24/21  Yes Johnson, Clanford L, MD  senna-docusate (SENOKOT-S) 8.6-50 MG tablet Take 1 tablet by mouth daily as needed for mild constipation. 02/01/20  Yes [provider]  simvastatin (ZOCOR) 5 MG tablet Take 5 mg by mouth daily. 09/14/16  Yes [provider]  SYMBICORT 160-4.5 MCG/ACT inhaler Inhale 2 puffs into the lungs daily as needed (shortness of breath). 12/22/19  Yes [provider]  nitroGLYCERIN (NITROSTAT) 0.4 MG SL tablet Place 0.4 mg under the tongue every 5 (five) minutes as needed for chest pain.  12/27/17   [provider]  insulin aspart (NOVOLOG) 100 UNIT/ML injection Inject 0-15 Units  into the skin 3 (three) times daily with meals. Patient not taking: No sig reported 12/31/20 01/22/21  Deatra James, MD    Inpatient Medications: Scheduled Meds:  Continuous Infusions:  PRN Meds:   Allergies:   No Known Allergies  Social History:   Social History   Socioeconomic History   Marital status: Divorced    Spouse name: Not on file   Number of children: Not on file   Years of education: Not on file   Highest education level: Not on file  Occupational History   Not on file  Tobacco Use   Smoking status: Former    Packs/day: 0.25    Years: 30.00    Pack years: 7.50    Types: Cigarettes    Quit date: 10/2019    Years since quitting: 1.3   Smokeless tobacco: Never  Vaping Use   Vaping Use: Never used  Substance and Sexual Activity   Alcohol use: No    Alcohol/week: 0.0 standard drinks   Drug use: No   Sexual activity: Yes  Other Topics Concern   Not on file  Social History Narrative   Not on file   Social Determinants of Health   Financial Resource Strain: Not on file  Food Insecurity: Not on file  Transportation Needs: Not on file  Physical  Activity: Not on file  Stress: Not on file  Social Connections: Not on file  Intimate Partner Violence: Not At Risk   Fear of Current or Ex-Partner: No   Emotionally Abused: No   Physically Abused: No   Sexually Abused: No    Family History:    Family History  Problem Relation Age of Onset   Cancer Mother        breast and lung   Cancer Father        bladder   Cancer Maternal Uncle        prostate   Cancer Paternal Uncle        esophagus   Colon cancer Neg Hx      ROS:  Please see the history of present illness.   All other ROS reviewed and negative.     Physical Exam/Data:   Vitals:   01/30/21 0528 01/30/21 0530 01/30/21 0600 01/30/21 0730  BP:  (!) 149/88 124/81 (!) 140/92  Pulse:  91 91 98  Resp:   16 19  Temp:      SpO2: 100% 100% 98% 95%  Weight:      Height:       No intake or output data in the 24 hours ending 01/30/21 0856 Last 3 Weights 01/30/2021 01/22/2021 01/21/2021  Weight (lbs) 321 lb 6.9 oz 321 lb 6.9 oz 314 lb 9.5 oz  Weight (kg) 145.8 kg 145.8 kg 142.7 kg     Body mass index is 42.41 kg/m.  General:  Obese male, in no acute distress HEENT: normal Lymph: no adenopathy Neck: thick neck, unable to assess JVP Endocrine:  No thryomegaly Vascular: No carotid bruits; FA pulses 2+ bilaterally without bruits  Cardiac:  normal S1, S2; irregularly irregular Lungs:  Decreased breath sounds at bases, bilateral wheezes Abd: soft, nontender, no hepatomegaly  Ext: 1+ LE edema, stasis dermatitis Musculoskeletal:  No deformities, BUE and BLE strength normal and equal Skin: warm and dry  Neuro:  CNs 2-12 intact, no focal abnormalities noted Psych:  Normal affect    Laboratory Data:  High Sensitivity Troponin:   Recent Labs  Lab 01/14/21 1924 01/19/21 1930 01/19/21  2143 01/30/21 0312 01/30/21 0518  TROPONINIHS 34* 41* 42* 41* 47*     Chemistry Recent Labs  Lab 01/30/21 0312  NA 141  K 3.9  CL 102  CO2 31  GLUCOSE 127*  BUN 53*   CREATININE 2.36*  CALCIUM 8.9  GFRNONAA 28*  ANIONGAP 8    No results for input(s): PROT, ALBUMIN, AST, ALT, ALKPHOS, BILITOT in the last 168 hours. Hematology Recent Labs  Lab 01/30/21 0312  WBC 8.4  RBC 5.40  HGB 15.3  HCT 51.0  MCV 94.4  MCH 28.3  MCHC 30.0  RDW 17.5*  PLT 164   BNP Recent Labs  Lab 01/30/21 0312  BNP 267.0*    DDimer No results for input(s): DDIMER in the last 168 hours.   Radiology/Studies:  DG Chest Portable 1 View  Result Date: 01/30/2021 CLINICAL DATA:  Dyspnea EXAM: PORTABLE CHEST 1 VIEW COMPARISON:  01/19/2021 FINDINGS: Lung volumes are small, but are stable since prior examination. Small bilateral pleural effusions are present. Trace bilateral perihilar interstitial pulmonary infiltrate is present, likely cardiogenic in nature. No pneumothorax. Mild cardiomegaly is stable. No acute bone abnormality IMPRESSION: Mild cardiogenic failure with small bilateral pleural effusions. Electronically Signed   By: Fidela Salisbury MD   On: 01/30/2021 02:47     CV Studies Echocardiogram 12/28/2020 1. Left ventricular ejection fraction, by estimation, is 55 to 60%. The  left ventricle has normal function. The left ventricle has no regional  wall motion abnormalities. The left ventricular internal cavity size was  moderately dilated. Left ventricular  diastolic function could not be evaluated.   2. Right ventricular systolic function is normal. The right ventricular  size is mildly enlarged. There is moderately elevated pulmonary artery  systolic pressure.   3. Left atrial size was mildly dilated.   4. Right atrial size was moderately dilated.   5. The mitral valve is normal in structure. Trivial mitral valve  regurgitation. No evidence of mitral stenosis.   6. The aortic valve is tricuspid. Aortic valve regurgitation is not  visualized. Mild aortic valve sclerosis is present, with no evidence of  aortic valve stenosis.   7. The inferior vena cava is  dilated in size with >50% respiratory  variability, suggesting right atrial pressure of 8 mmHg.  LHC 01/20/2017 1st Mrg lesion, 100 %stenosed. This is the culprit for his recent MI. Ramus lesion, 75 %stenosed. This is a relatively small vessel. Mid LAD lesion, 80 %stenosed. LV end diastolic pressure is moderately elevated. There is no aortic valve stenosis. Completed MI from occluded obtuse marginal.  Patient has been pain free and feels only fatigued.  We elected not to attempt intervention given the time course of his enzymes.  PCI of LAD 01/21/2017 Significant multivessel coronary artery disease (see yesterday's diagnostic catheterization for details). Successful PCI to mid LAD with placement of a Synergy 3.0 x 32 mm drug-eluting stent with 0% residual stenosis and TIMI-3 flow.    Assessment and Plan:   HFpEF (acute on chronic heart failure) Shortness of breath likely multifactorial in etiology (COPD/obesity/CHF etc). Issues with medication compliance. Also poor social support.  Trops are flat (41 and 47) and presentation is not consistent with ACS. BNP 267 (was 163 on 01/19/2021)  -IV Lasix in bolus dosing (titrated to response) -Strict I and Os, daily weights, monitor BUN/Cr -Does not need repeat echo (last study 12/28/20 - EF 55-60%) -Reasonable to continue isosorbide mononitrate for afterload reduction -Assess outpatient support system to avoid recurrent hospitalizations -Bilateral  wheezes on exam, will defer to primary team for nebs/steroids/abx   2. Chronic atrial fibrillation Currently rate controlled. HR on telemetry is in the 80s and 90s.  -Continue Eliquis -Continue bisoprolol    For questions or updates, please contact [place] Please consult [email or web address] for contact info under    Signed, Meade Maw, MD  01/30/2021 8:56 AM

## 2021-01-31 LAB — BASIC METABOLIC PANEL
Anion gap: 9 (ref 5–15)
BUN: 58 mg/dL — ABNORMAL HIGH (ref 8–23)
CO2: 35 mmol/L — ABNORMAL HIGH (ref 22–32)
Calcium: 8.7 mg/dL — ABNORMAL LOW (ref 8.9–10.3)
Chloride: 100 mmol/L (ref 98–111)
Creatinine, Ser: 2.55 mg/dL — ABNORMAL HIGH (ref 0.61–1.24)
GFR, Estimated: 26 mL/min — ABNORMAL LOW (ref >60)
Glucose, Bld: 48 mg/dL — ABNORMAL LOW (ref 70–99)
Potassium: 3.4 mmol/L — ABNORMAL LOW (ref 3.5–5.1)
Sodium: 144 mmol/L (ref 135–145)

## 2021-01-31 LAB — CBC
HCT: 47.7 % (ref 39.0–52.0)
Hemoglobin: 14 g/dL (ref 13.0–17.0)
MCH: 27.8 pg (ref 26.0–34.0)
MCHC: 29.4 g/dL — ABNORMAL LOW (ref 30.0–36.0)
MCV: 94.8 fL (ref 80.0–100.0)
Platelets: 178 10*3/uL (ref 150–400)
RBC: 5.03 MIL/uL (ref 4.22–5.81)
RDW: 17.1 % — ABNORMAL HIGH (ref 11.5–15.5)
WBC: 9.7 10*3/uL (ref 4.0–10.5)
nRBC: 0 % (ref 0.0–0.2)

## 2021-01-31 LAB — GLUCOSE, CAPILLARY
Glucose-Capillary: 46 mg/dL — ABNORMAL LOW (ref 70–99)
Glucose-Capillary: 74 mg/dL (ref 70–99)
Glucose-Capillary: 74 mg/dL (ref 70–99)
Glucose-Capillary: 85 mg/dL (ref 70–99)
Glucose-Capillary: 106 mg/dL — ABNORMAL HIGH (ref 70–99)

## 2021-01-31 LAB — MAGNESIUM: Magnesium: 2.7 mg/dL — ABNORMAL HIGH (ref 1.7–2.4)

## 2021-01-31 MED ORDER — INSULIN GLARGINE 100 UNIT/ML ~~LOC~~ SOLN
10.0000 [IU] | Freq: Every day | SUBCUTANEOUS | Status: DC
  Administered 2021-01-31: 10 [IU] via SUBCUTANEOUS
  Filled 2021-01-31 (×3): qty 0.1

## 2021-01-31 MED ORDER — INSULIN GLARGINE 100 UNIT/ML ~~LOC~~ SOLN
15.0000 [IU] | Freq: Every day | SUBCUTANEOUS | Status: DC
  Filled 2021-01-31 (×3): qty 0.15

## 2021-01-31 MED ORDER — FUROSEMIDE 10 MG/ML IJ SOLN
8.0000 mg/h | INTRAVENOUS | Status: DC
  Administered 2021-01-31: 4 mg/h via INTRAVENOUS
  Administered 2021-02-01 – 2021-02-05 (×5): 8 mg/h via INTRAVENOUS
  Filled 2021-01-31 (×6): qty 20

## 2021-01-31 MED ORDER — POTASSIUM CHLORIDE CRYS ER 20 MEQ PO TBCR
40.0000 meq | EXTENDED_RELEASE_TABLET | Freq: Once | ORAL | Status: AC
  Administered 2021-01-31: 40 meq via ORAL
  Filled 2021-01-31: qty 2

## 2021-01-31 NOTE — Progress Notes (Signed)
PROGRESS NOTE    [name] [name]  FFM:[id number] DOB: 1946/04/09 DOA: 01/30/2021 PCP: Nickola Major, MD  Outpatient Specialists: Lonia Mad MD    Brief Narrative:  01/30/21 - [name] [name] is a 75 y.o. male a history of CHF with preserved ejection fraction, CAD, atrial fibrillation Eliquis, stage 4 CKD, COPD hypertension, non-Hodgkin's lymphoma, diabetes presenting to the ED with increased shortness of breath over the past several hours. Patient recently admitted to the hospital and discharged 6 days ago for CHF and COPD exacerbation. Patient has had multiple ED presentations and admissions in the last six months for the same issue. Patient was on Lasix 40mg  BID PO and IMDUR 30 mg daily at home. States he is adherent with medication,weighs himself daily and takes extra dose of Lasix when feeling like he is gaining fluid. Lasix 40mg  IV BID was administered until 6/30. Patient was placed on BiPAP, which improved his mentation. 139 kg at baseline and up to 142 kg today. UOP still not at target (600 ml today). Cardiology is consulted and recommend to change divided dose IV Lasix to Lasix infusion (4mg /hr) on 7/1. Continue home Eliquis, Zebeta, Imdur, and Zocor. Palliative care saw patient last hospitalization and following through this hospitalization as well.   Assessment & Plan:   Active Problems:   Chronic kidney disease Stage IV   Type 2 diabetes mellitus with stage 4 chronic kidney disease (HCC)   Hypertension   Acute on chronic respiratory failure with hypoxia and hypercapnia (HCC)   AF (paroxysmal atrial fibrillation) (HCC)  Acute on chronic diastolic CHF - patient presenting with orthopnea, increased O2 demand found to have slightly elevated BNP suggestive of CHF exacerbation. Patient was placed on BiPAP now on 5L [place] with improved breathing. Was 139 kg in April up to 142.2 kg today. UOP (600 ml) still not at target (>1L daily). Agrees to try rehab again. Cardiology following.  - Last  echo 5/28 demonstrates EF 55-60% - Started Lasix infusion (changed from IV Lasix BID)  - Continue Eliquis, Zebeta, Imdur, Zocor  - I&O, CMP, Weight daily  - Palliative consult to discuss goals of care   Acute on chronic respiratory failure - secondary to CHF and COPD. Breathing improves, O2 sat 93% on 5L [place] (3-4L required baseline). Afebrile, normal WBC, negative procalcitonin ruling against pneumonia. - Continue 5L [place] - Continue home inhalers - Plan for short-term rehab at [facility] Atrial fibrillation - regular rate at admission. - Cardiology consulted; appreciate recommendations - CHA2DS2-VASc score is 5 - Continue home eliquis for  stroke prophylaxis - Continue zebeta for heart rate control   Diabetes mellitus type 2 - BG upon admission 127. A1c 5.8%. On Tresiba 56 units qhs at home AM Glucose dropped to 48, improved to 74 with juice.  - Decrease Lantus to 10 units qhs with holding parameters for blood glucose less than 120. - Diabetes coordinator consulted and following, agreeing with plan to decrease Lantus   Chronic kidney disease stage IV - Creatine 2.36-2.55.  - Follow kidney function with BMP daily - Has nephrology assessment at [facility] per cardiology - Plan to establish nephrology follow up at [place]   Hypothyroidism - last TSH 3/6 1.681 - Continue home levothyroxine    DVT prophylaxis: Eliquis Code Status: Full code Family Communication: Will call Disposition Plan: rehab Consults called: Cardiology, Palliative Admission status: inpatient  Consultants:  Cardiology (Rozann Lesches, MD)  Palliative care Quinn Axe, NP)  Subjective: Patient was evaluated  at bedside. Mr. Pettet was sitting up in chair next to bed with his nasal cannula on. Endorses feeling and breathing better. Nasal congestion improved. Discussed his wish to continue with short-term rehab; prefers to go to [facility]. Continues to endorse adherence to medications at  home.  Objective: Vitals:   01/30/21 2120 01/31/21 0350 01/31/21 0824 01/31/21 0939  BP: 125/83 139/90    Pulse: 86 73    Resp: 18 19    Temp: 98.4 F (36.9 C) 98.2 F (36.8 C)    TempSrc:      SpO2: 97% 99% 93%   Weight:    (!) 142.2 kg  Height:        Intake/Output Summary (Last 24 hours) at 01/31/2021 1228 Last data filed at 01/31/2021 1000 Gross per 24 hour  Intake 593 ml  Output 1200 ml  Net -607 ml   Filed Weights   01/30/21 0214 01/31/21 0939  Weight: (!) 145.8 kg (!) 142.2 kg    Examination:  General exam: No acute distress Respiratory system: Nonlabored. Decreased breath sounds at the bases. Mild crackles appreciated. High-pitched wheezes at end of expiration.  Cardiovascular system: Irregularly irregular. 1/6 systolic murmur, no gallop Gastrointestinal system: Protuberant, bowel sounds present Central nervous system: Alert and oriented. No focal neurological deficits. Extremities: Chronic appearing edema 2+, and venous stasis. No deformities. 2+ distal pulses Skin: No new rashes, lesions or ulcers Psychiatry: Judgement and insight appear normal. Mood & affect appropriate.    Data Reviewed: I have personally reviewed following labs and imaging studies  CBC: Recent Labs  Lab 01/30/21 0312 01/31/21 0511  WBC 8.4 9.7  NEUTROABS 7.5  --   HGB 15.3 14.0  HCT 51.0 47.7  MCV 94.4 94.8  PLT 164 062   Basic Metabolic Panel: Recent Labs  Lab 01/30/21 0312 01/31/21 0511  NA 141 144  K 3.9 3.4*  CL 102 100  CO2 31 35*  GLUCOSE 127* 48*  BUN 53* 58*  CREATININE 2.36* 2.55*  CALCIUM 8.9 8.7*  MG  --  2.7*   GFR: Estimated Creatinine Clearance: 38.2 mL/min (A) (by C-G formula based on SCr of 2.55 mg/dL (H)). Liver Function Tests: No results for input(s): AST, ALT, ALKPHOS, BILITOT, PROT, ALBUMIN in the last 168 hours. No results for input(s): LIPASE, AMYLASE in the last 168 hours. No results for input(s): AMMONIA in the last 168 hours. Coagulation  Profile: No results for input(s): INR, PROTIME in the last 168 hours. Cardiac Enzymes: No results for input(s): CKTOTAL, CKMB, CKMBINDEX, TROPONINI in the last 168 hours. BNP (last 3 results) No results for input(s): PROBNP in the last 8760 hours. HbA1C: No results for input(s): HGBA1C in the last 72 hours. CBG: Recent Labs  Lab 01/30/21 1619 01/30/21 2136 01/31/21 0750 01/31/21 0812 01/31/21 1116  GLUCAP 138* 118* 46* 74 74   Lipid Profile: No results for input(s): CHOL, HDL, LDLCALC, TRIG, CHOLHDL, LDLDIRECT in the last 72 hours. Thyroid Function Tests: No results for input(s): TSH, T4TOTAL, FREET4, T3FREE, THYROIDAB in the last 72 hours. Anemia Panel: No results for input(s): VITAMINB12, FOLATE, FERRITIN, TIBC, IRON, RETICCTPCT in the last 72 hours. Urine analysis:    Component Value Date/Time   COLORURINE YELLOW 11/19/2020 1736   APPEARANCEUR CLEAR 11/19/2020 1736   LABSPEC 1.015 11/19/2020 1736   PHURINE 6.0 11/19/2020 1736   GLUCOSEU NEGATIVE 11/19/2020 1736   HGBUR NEGATIVE 11/19/2020 1736   BILIRUBINUR NEGATIVE 11/19/2020 1736   KETONESUR NEGATIVE 11/19/2020 1736   PROTEINUR >=300 (  A) 11/19/2020 1736   UROBILINOGEN 4.0 (H) 07/23/2014 1356   NITRITE NEGATIVE 11/19/2020 1736   LEUKOCYTESUR NEGATIVE 11/19/2020 1736   Sepsis Labs: @LABRCNTIP (procalcitonin:4,lacticidven:4)  ) Recent Results (from the past 240 hour(s))  Resp Panel by RT-PCR (Flu A&B, Covid) Nasopharyngeal Swab     Status: None   Collection Time: 01/23/21  2:36 PM   Specimen: Nasopharyngeal Swab; Nasopharyngeal(NP) swabs in vial transport medium  Result Value Ref Range Status   SARS Coronavirus 2 by RT PCR NEGATIVE NEGATIVE Final    Comment: (NOTE) SARS-CoV-2 target nucleic acids are NOT DETECTED.  The SARS-CoV-2 RNA is generally detectable in upper respiratory specimens during the acute phase of infection. The lowest concentration of SARS-CoV-2 viral copies this assay can detect is 138  copies/mL. A negative result does not preclude SARS-Cov-2 infection and should not be used as the sole basis for treatment or other patient management decisions. A negative result may occur with  improper specimen collection/handling, submission of specimen other than nasopharyngeal swab, presence of viral mutation(s) within the areas targeted by this assay, and inadequate number of viral copies(<138 copies/mL). A negative result must be combined with clinical observations, patient history, and epidemiological information. The expected result is Negative.  Fact Sheet for Patients:  EntrepreneurPulse.com.au  Fact Sheet for Healthcare Providers:  IncredibleEmployment.be  This test is no t yet approved or cleared by the [place] FDA and  has been authorized for detection and/or diagnosis of SARS-CoV-2 by FDA under an Emergency Use Authorization (EUA). This EUA will remain  in effect (meaning this test can be used) for the duration of the COVID-19 declaration under Section 564(b)(1) of the Act, 21 U.S.C.section 360bbb-3(b)(1), unless the authorization is terminated  or revoked sooner.       Influenza A by PCR NEGATIVE NEGATIVE Final   Influenza B by PCR NEGATIVE NEGATIVE Final    Comment: (NOTE) The Xpert Xpress SARS-CoV-2/FLU/RSV plus assay is intended as an aid in the diagnosis of influenza from Nasopharyngeal swab specimens and should not be used as a sole basis for treatment. Nasal washings and aspirates are unacceptable for Xpert Xpress SARS-CoV-2/FLU/RSV testing.  Fact Sheet for Patients: EntrepreneurPulse.com.au  Fact Sheet for Healthcare Providers: IncredibleEmployment.be  This test is not yet approved or cleared by the [place] FDA and has been authorized for detection and/or diagnosis of SARS-CoV-2 by FDA under an Emergency Use Authorization (EUA). This EUA will remain in effect (meaning  this test can be used) for the duration of the COVID-19 declaration under Section 564(b)(1) of the Act, 21 U.S.C. section 360bbb-3(b)(1), unless the authorization is terminated or revoked.  Performed at [facility], [address], [place], [place] [address]   Resp Panel by RT-PCR (Flu A&B, Covid) Nasopharyngeal Swab     Status: None   Collection Time: 01/30/21  2:21 AM   Specimen: Nasopharyngeal Swab; Nasopharyngeal(NP) swabs in vial transport medium  Result Value Ref Range Status   SARS Coronavirus 2 by RT PCR NEGATIVE NEGATIVE Final    Comment: (NOTE) SARS-CoV-2 target nucleic acids are NOT DETECTED.  The SARS-CoV-2 RNA is generally detectable in upper respiratory specimens during the acute phase of infection. The lowest concentration of SARS-CoV-2 viral copies this assay can detect is 138 copies/mL. A negative result does not preclude SARS-Cov-2 infection and should not be used as the sole basis for treatment or other patient management decisions. A negative result may occur with  improper specimen collection/handling, submission of specimen other than nasopharyngeal swab, presence of viral  mutation(s) within the areas targeted by this assay, and inadequate number of viral copies(<138 copies/mL). A negative result must be combined with clinical observations, patient history, and epidemiological information. The expected result is Negative.  Fact Sheet for Patients:  EntrepreneurPulse.com.au  Fact Sheet for Healthcare Providers:  IncredibleEmployment.be  This test is no t yet approved or cleared by the [place] FDA and  has been authorized for detection and/or diagnosis of SARS-CoV-2 by FDA under an Emergency Use Authorization (EUA). This EUA will remain  in effect (meaning this test can be used) for the duration of the COVID-19 declaration under Section 564(b)(1) of the Act, 21 U.S.C.section 360bbb-3(b)(1), unless the authorization is  terminated  or revoked sooner.       Influenza A by PCR NEGATIVE NEGATIVE Final   Influenza B by PCR NEGATIVE NEGATIVE Final    Comment: (NOTE) The Xpert Xpress SARS-CoV-2/FLU/RSV plus assay is intended as an aid in the diagnosis of influenza from Nasopharyngeal swab specimens and should not be used as a sole basis for treatment. Nasal washings and aspirates are unacceptable for Xpert Xpress SARS-CoV-2/FLU/RSV testing.  Fact Sheet for Patients: EntrepreneurPulse.com.au  Fact Sheet for Healthcare Providers: IncredibleEmployment.be  This test is not yet approved or cleared by the [place] FDA and has been authorized for detection and/or diagnosis of SARS-CoV-2 by FDA under an Emergency Use Authorization (EUA). This EUA will remain in effect (meaning this test can be used) for the duration of the COVID-19 declaration under Section 564(b)(1) of the Act, 21 U.S.C. section 360bbb-3(b)(1), unless the authorization is terminated or revoked.  Performed at [facility], [address], [place], [place] [address]          Radiology Studies: DG Chest Portable 1 View  Result Date: 01/30/2021 CLINICAL DATA:  Dyspnea EXAM: PORTABLE CHEST 1 VIEW COMPARISON:  01/19/2021 FINDINGS: Lung volumes are small, but are stable since prior examination. Small bilateral pleural effusions are present. Trace bilateral perihilar interstitial pulmonary infiltrate is present, likely cardiogenic in nature. No pneumothorax. Mild cardiomegaly is stable. No acute bone abnormality IMPRESSION: Mild cardiogenic failure with small bilateral pleural effusions. Electronically Signed   By: Fidela Salisbury MD   On: 01/30/2021 02:47        Scheduled Meds:  apixaban  5 mg Oral BID   bisoprolol  5 mg Oral Daily   insulin aspart  0-15 Units Subcutaneous TID WC   insulin aspart  0-5 Units Subcutaneous QHS   insulin glargine  15 Units Subcutaneous QHS   isosorbide mononitrate  30  mg Oral Daily   levothyroxine  300 mcg Oral QAC breakfast   loratadine  10 mg Oral QPM   mometasone-formoterol  2 puff Inhalation BID   potassium chloride  10 mEq Oral Daily   simvastatin  5 mg Oral q1800   sodium chloride flush  3 mL Intravenous Q12H   Continuous Infusions:  sodium chloride     furosemide (LASIX) 200 mg in dextrose 5% 100 mL (2mg /mL) infusion 4 mg/hr (01/31/21 1037)     LOS: 1 day   Addendum:  Patient being treated for acute on chronic diastolic heart failure exacerbation and has been transitioned to Lasix drip per cardiology with hopeful plans for over 1 L diuresis per day.  Patient's baseline weight previously noted to be 139 kg.  He will need nephrology follow-up in the outpatient setting or perhaps even inpatient if diuresis is not adequate.  Plan to place at SNF on discharge.  Time spent: 30  minutes    Fredrik Rigger, medical student [facility] Pager [phone]  If 7PM-7AM, please contact night-coverage [email or web address] Password [facility] 01/31/2021, 12:28 PM

## 2021-01-31 NOTE — Progress Notes (Signed)
Inpatient Diabetes Program Recommendations  AACE/ADA: New Consensus Statement on Inpatient Glycemic Control (2015)  Target Ranges:  Prepandial:   less than 140 mg/dL      Peak postprandial:   less than 180 mg/dL (1-2 hours)      Critically ill patients:  140 - 180 mg/dL   Lab Results  Component Value Date   GLUCAP 74 01/31/2021   HGBA1C 5.8 (H) 01/21/2021    Review of Glycemic Control Results for [name] [name], [name] [name]" (MRN [id number]) as of 01/31/2021 10:10  Ref. Range 01/30/2021 12:39 01/30/2021 16:19 01/30/2021 21:36 01/31/2021 07:50 01/31/2021 08:12  Glucose-Capillary Latest Ref Range: 70 - 99 mg/dL 139 (H) 138 (H) 118 (H) 46 (L) 74   Diabetes history: DM 2 Outpatient Diabetes medications:  Tresiba 56 units q HS Current orders for Inpatient glycemic control:  Novolog moderate tid with meals and HS Lantus 15 units q HS Inpatient Diabetes Program Recommendations:   Agree with reduction in Lantus to 15 units q HS. Will follow.   Thanks,  Adah Perl, RN, BC-ADM Inpatient Diabetes Coordinator Pager [phone]  (8a-5p)

## 2021-01-31 NOTE — Care Management Important Message (Signed)
Important Message  Patient Details  Name: [name] [name] MRN: [id number] Date of Birth: July 04, 1946   Medicare Important Message Given:  Yes     Tommy Medal 01/31/2021, 1:22 PM

## 2021-01-31 NOTE — NC FL2 (Signed)
[place] LEVEL OF CARE SCREENING TOOL     IDENTIFICATION  Patient Name: [name] [name] Birthdate: 1946/08/03 Sex: male Admission Date (Current Location): 01/30/2021  [facility] and [place] Number:  [employer] and Address:  [place] [address], [place]      Provider Number: [id number]  Attending Physician Name and Address:  Rodena Goldmann, DO  Relative Name and Phone Number:       Current Level of Care: Hospital Recommended Level of Care: [place] Prior Approval Number:    Date Approved/Denied:   PASRR Number: [id number]  Discharge Plan: SNF    Current Diagnoses: Patient Active Problem List   Diagnosis Date Noted   Acute on chronic respiratory failure with hypercapnia (HCC)    CKD (chronic kidney disease) stage 4, GFR 15-29 ml/min ([place]) 01/21/2021   Pleural effusion 01/20/2021   Thrombocytopenia ([place]) 01/20/2021   Elevated d-dimer 01/20/2021   Atrial fibrillation, chronic ([place]) 01/20/2021   CHF exacerbation ([place]) 12/28/2020   CHF (congestive heart failure) ([place]) 12/28/2020   Acute on chronic diastolic HF (heart failure) ([place]) 10/06/2020   Chronic respiratory failure with hypoxia and hypercapnia ([place]) 09/10/2020   Former smoker 08/10/2020   COPD (chronic obstructive pulmonary disease) ([place]) 08/10/2020   Acute on chronic diastolic CHF (congestive heart failure) ([place]) 08/05/2020   Acute respiratory failure with hypoxia ([place]) 08/04/2020   Acute exacerbation of CHF (congestive heart failure) ([place]) 07/19/2020   AF (paroxysmal atrial fibrillation) ([place]) 07/19/2020   COVID-19    Acute on chronic respiratory failure with hypoxia and hypercapnia ([place]) 06/25/2020   Aspiration pneumonia ([place]) 06/24/2020   Pneumonia due to COVID-19 virus 06/24/2020   Confusion    Small bowel obstruction ([place]) 01/29/2020   SBO (small bowel obstruction) ([place]) 01/28/2020   Primary osteoarthritis of left knee 05/24/2018    Primary localized osteoarthritis of left knee 05/19/2018   Acute on chronic systolic and diastolic heart failure, NYHA class 1 ([place]) 04/01/2017   CAD (coronary artery disease) 04/01/2017   DOE (dyspnea on exertion) 03/21/2017   HTN (hypertension) 03/21/2017   NSTEMI (non-ST elevated myocardial infarction) ([place]) 01/19/2017   NSTEMI, initial episode of care ([place]) 01/19/2017   Sepsis ([place]) 07/24/2016   Elevated troponin 07/24/2016   Fever 07/24/2016   Lactic acidosis 07/24/2016   Adjustment insomnia 05/18/2016   Erectile dysfunction 12/19/2015   Sleep apnea 09/30/2015   Osteoarthritis 09/30/2015   Hypothyroidism 09/30/2015   Hypogonadism in male 09/30/2015   Diabetic neuropathy ([place]) 09/30/2015   Chronic pain of left knee 09/30/2015   Benign essential hypertension 09/30/2015   Acute on chronic renal failure ([place]) 05/14/2015   Diarrhea 05/14/2015   Dehydration 05/14/2015   Hypokalemia 05/14/2015   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[place]) 10/05/2014   Pelvic mass in male    Varices, esophageal (HCC)    Colonic mass    Abnormal CT scan, pelvis    Bladder mass    Elevated liver enzymes    Elevated LFTs    Abdominal pain 07/23/2014   Chronic kidney disease Stage IV 07/23/2014   Morbid obesity ([place]) 07/23/2014   Type 2 diabetes mellitus with stage 4 chronic kidney disease ([place]) 07/23/2014   Hypertension 07/23/2014   S/P total knee replacement, 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    Orientation RESPIRATION BLADDER Height & Weight     Self, Time, Situation, Place  O2 (see dc summary) Continent Weight: (!) 313 lb 6.4 oz (142.2 kg) Height:  6\' 1"  (185.4  cm)  BEHAVIORAL SYMPTOMS/MOOD NEUROLOGICAL BOWEL NUTRITION STATUS      Continent Diet (see dc summary)  AMBULATORY STATUS COMMUNICATION OF NEEDS Skin   Extensive Assist Verbally Normal                       Personal Care Assistance Level of Assistance    Bathing Assistance: Limited assistance Feeding assistance: Independent Dressing Assistance: Limited  assistance     Functional Limitations Info    Sight Info: Adequate Hearing Info: Adequate Speech Info: Adequate    SPECIAL CARE FACTORS FREQUENCY        PT Frequency: 5x week OT Frequency: 3x week            Contractures Contractures Info: Not present    Additional Factors Info    Code Status Info: Full Allergies Info: NKA           Current Medications (01/31/2021):  This is the current hospital active medication list Current Facility-Administered Medications  Medication Dose Route Frequency Provider Last Rate Last Admin   0.9 %  sodium chloride infusion  250 mL Intravenous PRN Manuella Ghazi, Pratik D, DO       acetaminophen (TYLENOL) tablet 650 mg  650 mg Oral Q6H PRN Manuella Ghazi, Pratik D, DO       Or   acetaminophen (TYLENOL) suppository 650 mg  650 mg Rectal Q6H PRN Manuella Ghazi, Pratik D, DO       apixaban (ELIQUIS) tablet 5 mg  5 mg Oral BID Manuella Ghazi, Pratik D, DO   5 mg at 01/31/21 1448   bisoprolol (ZEBETA) tablet 5 mg  5 mg Oral Daily Manuella Ghazi, Pratik D, DO   5 mg at 01/31/21 1856   furosemide (LASIX) 200 mg in dextrose 5 % 100 mL (2 mg/mL) infusion  4 mg/hr Intravenous Continuous Satira Sark, MD 2 mL/hr at 01/31/21 1037 4 mg/hr at 01/31/21 1037   insulin aspart (novoLOG) injection 0-15 Units  0-15 Units Subcutaneous TID WC Manuella Ghazi, Pratik D, DO   2 Units at 01/30/21 1712   insulin aspart (novoLOG) injection 0-5 Units  0-5 Units Subcutaneous QHS Manuella Ghazi, Pratik D, DO       insulin glargine (LANTUS) injection 15 Units  15 Units Subcutaneous QHS Shah, Pratik D, DO       ipratropium-albuterol (DUONEB) 0.5-2.5 (3) MG/3ML nebulizer solution 3 mL  3 mL Inhalation Q4H PRN Manuella Ghazi, Pratik D, DO   3 mL at 01/30/21 2014   isosorbide mononitrate (IMDUR) 24 hr tablet 30 mg  30 mg Oral Daily Manuella Ghazi, Pratik D, DO   30 mg at 01/31/21 3149   levothyroxine (SYNTHROID) tablet 300 mcg  300 mcg Oral QAC breakfast Heath Lark D, DO   300 mcg at 01/31/21 7026   loratadine (CLARITIN) tablet 10 mg  10 mg Oral QPM Manuella Ghazi,  Pratik D, DO   10 mg at 01/30/21 1711   mometasone-formoterol (DULERA) 200-5 MCG/ACT inhaler 2 puff  2 puff Inhalation BID Manuella Ghazi, Pratik D, DO   2 puff at 01/31/21 0823   nitroGLYCERIN (NITROSTAT) SL tablet 0.4 mg  0.4 mg Sublingual Q5 min PRN Manuella Ghazi, Pratik D, DO       ondansetron (ZOFRAN) tablet 4 mg  4 mg Oral Q6H PRN Manuella Ghazi, Pratik D, DO       Or   ondansetron (ZOFRAN) injection 4 mg  4 mg Intravenous Q6H PRN Manuella Ghazi, Pratik D, DO       potassium chloride (KLOR-CON) CR tablet 10 mEq  10 mEq Oral Daily Heath Lark D, DO   10 mEq at 01/31/21 0813   senna-docusate (Senokot-S) tablet 1 tablet  1 tablet Oral Daily PRN Heath Lark D, DO       simvastatin (ZOCOR) tablet 5 mg  5 mg Oral q1800 Manuella Ghazi, Pratik D, DO   5 mg at 01/30/21 1711   sodium chloride flush (NS) 0.9 % injection 3 mL  3 mL Intravenous Q12H Shah, Pratik D, DO   3 mL at 01/31/21 7034   sodium chloride flush (NS) 0.9 % injection 3 mL  3 mL Intravenous PRN Heath Lark D, DO         Discharge Medications: Please see discharge summary for a list of discharge medications.  Relevant Imaging Results:  Relevant Lab Results:   Additional Information SSN: [id number], [place]

## 2021-01-31 NOTE — Progress Notes (Signed)
Palliative: Attempted to meet with Mr. Uptain.  He is unavailable at this time, in the restroom. Mr. Kendrick had agreed to outpatient palliative services on his last discharge.it is unlikely that they were able to see him before he was readmitted. PMT to continue to follow.  Plan: Continue full scope/full code.  Historically declines short-term rehab.  Would clearly benefit from heart failure program.  No charge Quinn Axe, NP Palliative medicine team Team phone [phone] Greater than 50% of this time was spent counseling and coordinating care related to the above assessment and plan.

## 2021-01-31 NOTE — TOC Initial Note (Addendum)
Transition of Care [facility]) - Initial/Assessment Note    Patient Details  Name: [name] [name] MRN: [id number] Date of Birth: [birth year]  Transition of [facility]) CM/SW Contact:    Shade Flood, LCSW Phone Number: 01/31/2021, 2:43 PM  Clinical Narrative:                  Pt admitted from home. Pt known to this LCSW from previous admissions. During his last admission, TOC referred out to SNF and pt was only accepted at [facility] which he felt was too far so he went home with [facility]. Met with pt today to discuss dc planning. Pt states he would like SNF referrals again. Pt states that if [facility] is the only bed offer again, he will go there.   Will send referrals and start insurance auth when pt more stable. MD indicated pt would likely remain hospitalized for 4-5 days. TOC will follow.  Expected Discharge Plan: Skilled Nursing Facility Barriers to Discharge: Continued Medical Work up   Patient Goals and CMS Choice Patient states their goals for this hospitalization and ongoing recovery are:: rehab CMS Medicare.gov Compare Post Acute Care list provided to:: Patient Choice offered to / list presented to : Patient  Expected Discharge Plan and Services Expected Discharge Plan: [place] In-house Referral: Clinical Social Work   Post Acute Care Choice: [place] Living arrangements for the past 2 months: [place]                                      Prior Living Arrangements/Services Living arrangements for the past 2 months: Single Family Home Lives with:: Self Patient language and need for interpreter reviewed:: Yes Do you feel safe going back to the place where you live?: Yes      Need for Family Participation in Patient Care: Yes (Comment) Care giver support system in place?: Yes (comment) Current home services: Home PT, Home RN Criminal Activity/Legal Involvement Pertinent to Current Situation/Hospitalization: No - Comment as  needed  Activities of Daily Living Home Assistive Devices/Equipment: Environmental consultant (specify type) (Rollator) ADL Screening (condition at time of admission) Patient's cognitive ability adequate to safely complete daily activities?: Yes Is the patient deaf or have difficulty hearing?: No Does the patient have difficulty seeing, even when wearing glasses/contacts?: No Does the patient have difficulty concentrating, remembering, or making decisions?: No Patient able to express need for assistance with ADLs?: Yes Does the patient have difficulty dressing or bathing?: No Independently performs ADLs?: Yes (appropriate for developmental age) Does the patient have difficulty walking or climbing stairs?: Yes Weakness of Legs: Both Weakness of Arms/Hands: None  Permission Sought/Granted Permission sought to share information with : Chartered certified accountant granted to share information with : Yes, Verbal Permission Granted     Permission granted to share info w AGENCY: SNFs        Emotional Assessment Appearance:: Appears stated age Attitude/Demeanor/Rapport: Engaged Affect (typically observed): Pleasant Orientation: : Oriented to Self, Oriented to Place, Oriented to  Time, Oriented to Situation Alcohol / Substance Use: Not Applicable Psych Involvement: No (comment)  Admission diagnosis:  Acute exacerbation of CHF (congestive heart failure) (HCC) [I50.9] Acute on chronic diastolic congestive heart failure (HCC) [I50.33] Acute on chronic respiratory failure with hypercapnia (HCC) [J96.22] Patient Active Problem List   Diagnosis Date Noted   Acute on chronic respiratory failure with hypercapnia ([place])  CKD (chronic kidney disease) stage 4, GFR 15-29 ml/min (HCC) 01/21/2021   Pleural effusion 01/20/2021   Thrombocytopenia ([place]) 01/20/2021   Elevated d-dimer 01/20/2021   Atrial fibrillation, chronic ([place]) 01/20/2021   CHF exacerbation ([place]) 12/28/2020   CHF (congestive heart  failure) ([place]) 12/28/2020   Acute on chronic diastolic HF (heart failure) ([place]) 10/06/2020   Chronic respiratory failure with hypoxia and hypercapnia ([place]) 09/10/2020   Former smoker 08/10/2020   COPD (chronic obstructive pulmonary disease) ([place]) 08/10/2020   Acute on chronic diastolic CHF (congestive heart failure) ([place]) 08/05/2020   Acute respiratory failure with hypoxia ([place]) 08/04/2020   Acute exacerbation of CHF (congestive heart failure) ([place]) 07/19/2020   AF (paroxysmal atrial fibrillation) ([place]) 07/19/2020   COVID-19    Acute on chronic respiratory failure with hypoxia and hypercapnia (HCC) 06/25/2020   Aspiration pneumonia ([place]) 06/24/2020   Pneumonia due to COVID-19 virus 06/24/2020   Confusion    Small bowel obstruction ([place]) 01/29/2020   SBO (small bowel obstruction) ([place]) 01/28/2020   Primary osteoarthritis of left knee 05/24/2018   Primary localized osteoarthritis of left knee 05/19/2018   Acute on chronic systolic and diastolic heart failure, NYHA class 1 ([place]) 04/01/2017   CAD (coronary artery disease) 04/01/2017   DOE (dyspnea on exertion) 03/21/2017   HTN (hypertension) 03/21/2017   NSTEMI (non-ST elevated myocardial infarction) ([place]) 01/19/2017   NSTEMI, initial episode of care ([place]) 01/19/2017   Sepsis ([place]) 07/24/2016   Elevated troponin 07/24/2016   Fever 07/24/2016   Lactic acidosis 07/24/2016   Adjustment insomnia 05/18/2016   Erectile dysfunction 12/19/2015   Sleep apnea 09/30/2015   Osteoarthritis 09/30/2015   Hypothyroidism 09/30/2015   Hypogonadism in male 09/30/2015   Diabetic neuropathy ([place]) 09/30/2015   Chronic pain of left knee 09/30/2015   Benign essential hypertension 09/30/2015   Acute on chronic renal failure ([place]) 05/14/2015   Diarrhea 05/14/2015   Dehydration 05/14/2015   Hypokalemia 05/14/2015   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   Pelvic mass in male    Varices, esophageal (HCC)    Colonic mass    Abnormal CT scan, pelvis     Bladder mass    Elevated liver enzymes    Elevated LFTs    Abdominal pain 07/23/2014   Chronic kidney disease Stage IV 07/23/2014   Morbid obesity ([place]) 07/23/2014   Type 2 diabetes mellitus with stage 4 chronic kidney disease ([place]) 07/23/2014   Hypertension 07/23/2014   S/P total knee replacement, 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   PCP:  Nickola Major, MD Pharmacy:   [facility], [address] [place] [address] Phone: [phone] Fax: [phone]     Social Determinants of Health (SDOH) Interventions    Readmission Risk Interventions Readmission Risk Prevention Plan 01/31/2021 12/29/2020 10/07/2020  Transportation Screening Complete Complete Complete  Medication Review Press photographer) - Complete Complete  PCP or Specialist appointment within 3-5 days of discharge - Complete -  [place] or Home Care Consult Complete Complete Complete  SW Recovery Care/Counseling Consult Complete Complete Complete  Palliative Care Screening Complete Not Applicable Not Applicable  Skilled Nursing Facility Complete Complete Not Applicable  Some recent data might be hidden

## 2021-01-31 NOTE — Progress Notes (Signed)
Progress Note  Patient Name: [name] [name] Date of Encounter: 01/31/2021  Primary Cardiologist: Rozann Lesches, MD  Subjective   Up in bedside chair.  No chest pain or shortness of breath at rest.  Abdominal fullness and leg swelling as before.  Inpatient Medications    Scheduled Meds:  apixaban  5 mg Oral BID   bisoprolol  5 mg Oral Daily   furosemide  40 mg Intravenous Q12H   insulin aspart  0-15 Units Subcutaneous TID WC   insulin aspart  0-5 Units Subcutaneous QHS   insulin glargine  15 Units Subcutaneous QHS   isosorbide mononitrate  30 mg Oral Daily   levothyroxine  300 mcg Oral QAC breakfast   loratadine  10 mg Oral QPM   mometasone-formoterol  2 puff Inhalation BID   potassium chloride  10 mEq Oral Daily   simvastatin  5 mg Oral q1800   sodium chloride flush  3 mL Intravenous Q12H   Continuous Infusions:  sodium chloride     PRN Meds: sodium chloride, acetaminophen **OR** acetaminophen, ipratropium-albuterol, nitroGLYCERIN, ondansetron **OR** ondansetron (ZOFRAN) IV, senna-docusate, sodium chloride flush   Vital Signs    Vitals:   01/30/21 2017 01/30/21 2120 01/31/21 0350 01/31/21 0824  BP:  125/83 139/90   Pulse:  86 73   Resp:  18 19   Temp:  98.4 F (36.9 C) 98.2 F (36.8 C)   TempSrc:      SpO2: 94% 97% 99% 93%  Weight:      Height:        Intake/Output Summary (Last 24 hours) at 01/31/2021 0933 Last data filed at 01/31/2021 0500 Gross per 24 hour  Intake 237 ml  Output 800 ml  Net -563 ml   Filed Weights   01/30/21 0214  Weight: (!) 145.8 kg    Telemetry    Rate controlled atrial fibrillation.  Personally reviewed.  ECG    No ECG reviewed.  Physical Exam   GEN: No acute distress.   Neck: No JVD. Cardiac: Irregularly irregular, 1/6 systolic murmur, no gallop.  Respiratory: Nonlabored.  Decreased breath sounds at the bases. GI: Protuberant bowel sounds present. MS: Chronic appearing edema and venous stasis; No deformity. Neuro:   Nonfocal. Psych: Alert and oriented x 3. Normal affect.  Labs    Chemistry Recent Labs  Lab 01/30/21 0312 01/31/21 0511  NA 141 144  K 3.9 3.4*  CL 102 100  CO2 31 35*  GLUCOSE 127* 48*  BUN 53* 58*  CREATININE 2.36* 2.55*  CALCIUM 8.9 8.7*  GFRNONAA 28* 26*  ANIONGAP 8 9     Hematology Recent Labs  Lab 01/30/21 0312 01/31/21 0511  WBC 8.4 9.7  RBC 5.40 5.03  HGB 15.3 14.0  HCT 51.0 47.7  MCV 94.4 94.8  MCH 28.3 27.8  MCHC 30.0 29.4*  RDW 17.5* 17.1*  PLT 164 178    Cardiac Enzymes Recent Labs  Lab 01/14/21 1924 01/19/21 1930 01/19/21 2143 01/30/21 0312 01/30/21 0518  TROPONINIHS 34* 41* 42* 41* 47*    BNP Recent Labs  Lab 01/30/21 0312  BNP 267.0*     Radiology    DG Chest Portable 1 View  Result Date: 01/30/2021 CLINICAL DATA:  Dyspnea EXAM: PORTABLE CHEST 1 VIEW COMPARISON:  01/19/2021 FINDINGS: Lung volumes are small, but are stable since prior examination. Small bilateral pleural effusions are present. Trace bilateral perihilar interstitial pulmonary infiltrate is present, likely cardiogenic in nature. No pneumothorax. Mild cardiomegaly is stable. No acute  bone abnormality IMPRESSION: Mild cardiogenic failure with small bilateral pleural effusions. Electronically Signed   By: Fidela Salisbury MD   On: 01/30/2021 02:47    Cardiac Studies   Echocardiogram 12/28/2020:  1. Left ventricular ejection fraction, by estimation, is 55 to 60%. The  left ventricle has normal function. The left ventricle has no regional  wall motion abnormalities. The left ventricular internal cavity size was  moderately dilated. Left ventricular  diastolic function could not be evaluated.   2. Right ventricular systolic function is normal. The right ventricular  size is mildly enlarged. There is moderately elevated pulmonary artery  systolic pressure.   3. Left atrial size was mildly dilated.   4. Right atrial size was moderately dilated.   5. The mitral valve is  normal in structure. Trivial mitral valve  regurgitation. No evidence of mitral stenosis.   6. The aortic valve is tricuspid. Aortic valve regurgitation is not  visualized. Mild aortic valve sclerosis is present, with no evidence of  aortic valve stenosis.   7. The inferior vena cava is dilated in size with >50% respiratory  variability, suggesting right atrial pressure of 8 mmHg.   Assessment & Plan    1.  Acute on chronic diastolic heart failure, LVEF 55 to 60% with normal RV contraction by echocardiogram in May.  Significantly fluid overloaded, weight has increased from 139 kg in April up to 145 kg at present.  Chest x-ray shows mild interstitial prominence as well as vascular congestion and small bilateral pleural effusions.  2.  CKD stage 3b-4, creatinine 2.36-2.55.  Patient has had nephrology assessment at [facility] [place], would like to establish with local follow-up for sake of convenience.  3.  CAD status post DES to the LAD in 2018, no active angina at this time and high-sensitivity troponin I levels are flat and not suggestive of ACS.  4.  Permanent atrial fibrillation, CHA2DS2-VASc score is 5.  He is on Eliquis for stroke prophylaxis, also Zebeta for heart rate control.  Plan to change from divided dose IV Lasix to Lasix infusion.  Follow renal function, consider arranging local nephrology follow-up for sake of convenience.  Otherwise no change in Eliquis, Zebeta, Imdur, or Zocor.  Signed, Rozann Lesches, MD  01/31/2021, 9:33 AM

## 2021-02-01 LAB — GLUCOSE, CAPILLARY
Glucose-Capillary: 60 mg/dL — ABNORMAL LOW (ref 70–99)
Glucose-Capillary: 143 mg/dL — ABNORMAL HIGH (ref 70–99)
Glucose-Capillary: 134 mg/dL — ABNORMAL HIGH (ref 70–99)
Glucose-Capillary: 165 mg/dL — ABNORMAL HIGH (ref 70–99)
Glucose-Capillary: 209 mg/dL — ABNORMAL HIGH (ref 70–99)

## 2021-02-01 LAB — BASIC METABOLIC PANEL
Anion gap: 12 (ref 5–15)
BUN: 58 mg/dL — ABNORMAL HIGH (ref 8–23)
CO2: 31 mmol/L (ref 22–32)
Calcium: 8.7 mg/dL — ABNORMAL LOW (ref 8.9–10.3)
Chloride: 98 mmol/L (ref 98–111)
Creatinine, Ser: 2.48 mg/dL — ABNORMAL HIGH (ref 0.61–1.24)
GFR, Estimated: 27 mL/min — ABNORMAL LOW (ref >60)
Glucose, Bld: 69 mg/dL — ABNORMAL LOW (ref 70–99)
Potassium: 3.6 mmol/L (ref 3.5–5.1)
Sodium: 141 mmol/L (ref 135–145)

## 2021-02-01 LAB — MAGNESIUM: Magnesium: 2.5 mg/dL — ABNORMAL HIGH (ref 1.7–2.4)

## 2021-02-01 MED ORDER — PANTOPRAZOLE SODIUM 40 MG PO TBEC
40.0000 mg | DELAYED_RELEASE_TABLET | Freq: Every day | ORAL | Status: DC
  Administered 2021-02-01 – 2021-02-07 (×7): 40 mg via ORAL
  Filled 2021-02-01 (×7): qty 1

## 2021-02-01 MED ORDER — PREDNISONE 20 MG PO TABS
40.0000 mg | ORAL_TABLET | Freq: Every day | ORAL | Status: DC
  Administered 2021-02-01 – 2021-02-07 (×7): 40 mg via ORAL
  Filled 2021-02-01 (×7): qty 2

## 2021-02-01 MED ORDER — IPRATROPIUM-ALBUTEROL 0.5-2.5 (3) MG/3ML IN SOLN
3.0000 mL | Freq: Four times a day (QID) | RESPIRATORY_TRACT | Status: DC
  Administered 2021-02-01 – 2021-02-03 (×9): 3 mL via RESPIRATORY_TRACT
  Filled 2021-02-01 (×9): qty 3

## 2021-02-01 NOTE — Progress Notes (Signed)
PROGRESS NOTE    [name] [name]  NWG:[id number] DOB: September 27, 1945 DOA: 01/30/2021 PCP: Nickola Major, MD   Brief Narrative:   [name] [name] is a 75 y.o. male a history of CHF with preserved ejection fraction, CAD, atrial fibrillation Eliquis, stage 4 CKD, COPD hypertension, non-Hodgkin's lymphoma, diabetes presenting to the ED with increased shortness of breath over the past several hours. Patient recently admitted to the hospital and discharged 6 days ago for CHF and COPD exacerbation. Patient has had multiple ED presentations and admissions in the last six months for the same issue. Patient was on Lasix 40mg  BID PO and IMDUR 30 mg daily at home. States he is adherent with medication,weighs himself daily and takes extra dose of Lasix when feeling like he is gaining fluid. Lasix 40mg  IV BID was administered until 6/30. Patient was placed on BiPAP, which improved his mentation. 139 kg at baseline and up to 142 kg today. UOP still not at target (600 ml today). Cardiology is consulted and recommend to change divided dose IV Lasix to Lasix infusion (4mg /hr) on 7/1. Continue home Eliquis, Zebeta, Imdur, and Zocor. Palliative care saw patient last hospitalization and following through this hospitalization as well.  Assessment & Plan:   Active Problems:   Chronic kidney disease Stage IV   Type 2 diabetes mellitus with stage 4 chronic kidney disease (HCC)   Hypertension   Acute on chronic respiratory failure with hypoxia and hypercapnia (HCC)   AF (paroxysmal atrial fibrillation) (HCC)   Acute on chronic diastolic CHF - patient presenting with orthopnea, increased O2 demand found to have slightly elevated BNP suggestive of CHF exacerbation. Patient was placed on BiPAP now on 5L [place] with improved breathing. Was 139 kg in April up to 142.2 kg today. UOP (600 ml) still not at target (>1L daily). Agrees to try rehab again. Cardiology following.  - Last echo 5/28 demonstrates EF 55-60% - Started Lasix  infusion on 7/1 (changed from IV Lasix BID)-increase to 8mg /hr 7/2 - Continue Eliquis, Zebeta, Imdur, Zocor  - I&O, CMP, Weight daily  - Palliative consult obtained and pt wants full code/measures   Acute on chronic respiratory failure - secondary to CHF and COPD. Breathing improves, O2 sat 93% on 5L [place] (3-4L required baseline). Afebrile, normal WBC, negative procalcitonin ruling against pneumonia. -May have mild COPD exacerbation for which prednisone and scheduled duo nebs will be provided starting 7/2 - Continue 5L [place] - Continue home inhalers - Plan for [facility] vs [facility]   Chronic Atrial fibrillation - regular rate at admission. - Cardiology consulted; appreciate recommendations - CHA2DS2-VASc score is 5 - Continue home eliquis for  stroke prophylaxis - Continue zebeta for heart rate control    Diabetes mellitus type 2 - BG upon admission 127. A1c 5.8%. On Tresiba 56 units qhs at home AM Glucose dropped to 48, improved to 74 with juice. -Continues to have some hypoglycemia for which Lantus will be discontinued for now   Chronic kidney disease stage IV - Creatine 2.36-2.55. - Follow kidney function with BMP daily - Has nephrology assessment at [facility] per cardiology - Plan to establish nephrology follow up at [place]   Hypothyroidism - last TSH 3/6 1.681 - Continue home levothyroxine    DVT prophylaxis:Eliquis Code Status: Full Family Communication: None at bedside Disposition Plan:  Status is: Inpatient  Remains inpatient appropriate because:IV treatments appropriate due to intensity of illness or inability to take PO  Dispo: The patient is from:  Home              Anticipated d/c is to: SNF              Patient currently is not medically stable to d/c.   Difficult to place patient No   Nutritional Assessment:  The patient's BMI is: Body mass index is 41.3 kg/m.Marland Kitchen  Seen by dietician.  I agree with the assessment and plan as outlined  below:  Nutrition Status:     Consultants:  Cardiology Palliative  Procedures:  See below  Antimicrobials:  None   Subjective: Patient seen and evaluated today with no new acute complaints or concerns. No acute concerns or events noted overnight.  He has had some minimal wheezing that he states is improved with breathing treatments.  Objective: Vitals:   01/31/21 2014 02/01/21 0434 02/01/21 0500 02/01/21 0751  BP: (!) 139/104 (!) 154/117 (!) 153/101   Pulse: (!) 41 74 81   Resp: 20 (!) 21    Temp: 97.6 F (36.4 C)     TempSrc: Oral     SpO2: 96% 95%  96%  Weight:   (!) 142 kg   Height:        Intake/Output Summary (Last 24 hours) at 02/01/2021 0915 Last data filed at 02/01/2021 0500 Gross per 24 hour  Intake 1086.55 ml  Output 1425 ml  Net -338.45 ml   Filed Weights   01/30/21 0214 01/31/21 0939 02/01/21 0500  Weight: (!) 145.8 kg (!) 142.2 kg (!) 142 kg    Examination:  General exam: Appears calm and comfortable, obese Respiratory system: Clear to auscultation. Respiratory effort normal.  5 L nasal cannula oxygen Cardiovascular system: S1 & S2 heard, RRR.  Gastrointestinal system: Abdomen is soft Central nervous system: Alert and awake Extremities: No edema Skin: No significant lesions noted Psychiatry: Flat affect.    Data Reviewed: I have personally reviewed following labs and imaging studies  CBC: Recent Labs  Lab 01/30/21 0312 01/31/21 0511  WBC 8.4 9.7  NEUTROABS 7.5  --   HGB 15.3 14.0  HCT 51.0 47.7  MCV 94.4 94.8  PLT 164 932   Basic Metabolic Panel: Recent Labs  Lab 01/30/21 0312 01/31/21 0511 02/01/21 0733  NA 141 144 141  K 3.9 3.4* 3.6  CL 102 100 98  CO2 31 35* 31  GLUCOSE 127* 48* 69*  BUN 53* 58* 58*  CREATININE 2.36* 2.55* 2.48*  CALCIUM 8.9 8.7* 8.7*  MG  --  2.7* 2.5*   GFR: Estimated Creatinine Clearance: 39.3 mL/min (A) (by C-G formula based on SCr of 2.48 mg/dL (H)). Liver Function Tests: No results for  input(s): AST, ALT, ALKPHOS, BILITOT, PROT, ALBUMIN in the last 168 hours. No results for input(s): LIPASE, AMYLASE in the last 168 hours. No results for input(s): AMMONIA in the last 168 hours. Coagulation Profile: No results for input(s): INR, PROTIME in the last 168 hours. Cardiac Enzymes: No results for input(s): CKTOTAL, CKMB, CKMBINDEX, TROPONINI in the last 168 hours. BNP (last 3 results) No results for input(s): PROBNP in the last 8760 hours. HbA1C: No results for input(s): HGBA1C in the last 72 hours. CBG: Recent Labs  Lab 01/31/21 0812 01/31/21 1116 01/31/21 1620 01/31/21 2016 02/01/21 0742  GLUCAP 74 74 85 106* 60*   Lipid Profile: No results for input(s): CHOL, HDL, LDLCALC, TRIG, CHOLHDL, LDLDIRECT in the last 72 hours. Thyroid Function Tests: No results for input(s): TSH, T4TOTAL, FREET4, T3FREE, THYROIDAB in the last 72 hours.  Anemia Panel: No results for input(s): VITAMINB12, FOLATE, FERRITIN, TIBC, IRON, RETICCTPCT in the last 72 hours. Sepsis Labs: Recent Labs  Lab 01/30/21 0518  PROCALCITON <0.10    Recent Results (from the past 240 hour(s))  Resp Panel by RT-PCR (Flu A&B, Covid) Nasopharyngeal Swab     Status: None   Collection Time: 01/23/21  2:36 PM   Specimen: Nasopharyngeal Swab; Nasopharyngeal(NP) swabs in vial transport medium  Result Value Ref Range Status   SARS Coronavirus 2 by RT PCR NEGATIVE NEGATIVE Final    Comment: (NOTE) SARS-CoV-2 target nucleic acids are NOT DETECTED.  The SARS-CoV-2 RNA is generally detectable in upper respiratory specimens during the acute phase of infection. The lowest concentration of SARS-CoV-2 viral copies this assay can detect is 138 copies/mL. A negative result does not preclude SARS-Cov-2 infection and should not be used as the sole basis for treatment or other patient management decisions. A negative result may occur with  improper specimen collection/handling, submission of specimen other than  nasopharyngeal swab, presence of viral mutation(s) within the areas targeted by this assay, and inadequate number of viral copies(<138 copies/mL). A negative result must be combined with clinical observations, patient history, and epidemiological information. The expected result is Negative.  Fact Sheet for Patients:  EntrepreneurPulse.com.au  Fact Sheet for Healthcare Providers:  IncredibleEmployment.be  This test is no t yet approved or cleared by the [place] FDA and  has been authorized for detection and/or diagnosis of SARS-CoV-2 by FDA under an Emergency Use Authorization (EUA). This EUA will remain  in effect (meaning this test can be used) for the duration of the COVID-19 declaration under Section 564(b)(1) of the Act, 21 U.S.C.section 360bbb-3(b)(1), unless the authorization is terminated  or revoked sooner.       Influenza A by PCR NEGATIVE NEGATIVE Final   Influenza B by PCR NEGATIVE NEGATIVE Final    Comment: (NOTE) The Xpert Xpress SARS-CoV-2/FLU/RSV plus assay is intended as an aid in the diagnosis of influenza from Nasopharyngeal swab specimens and should not be used as a sole basis for treatment. Nasal washings and aspirates are unacceptable for Xpert Xpress SARS-CoV-2/FLU/RSV testing.  Fact Sheet for Patients: EntrepreneurPulse.com.au  Fact Sheet for Healthcare Providers: IncredibleEmployment.be  This test is not yet approved or cleared by the [place] FDA and has been authorized for detection and/or diagnosis of SARS-CoV-2 by FDA under an Emergency Use Authorization (EUA). This EUA will remain in effect (meaning this test can be used) for the duration of the COVID-19 declaration under Section 564(b)(1) of the Act, 21 U.S.C. section 360bbb-3(b)(1), unless the authorization is terminated or revoked.  Performed at [facility], [address] [place], [place] [address]   Resp  Panel by RT-PCR (Flu A&B, Covid) Nasopharyngeal Swab     Status: None   Collection Time: 01/30/21  2:21 AM   Specimen: Nasopharyngeal Swab; Nasopharyngeal(NP) swabs in vial transport medium  Result Value Ref Range Status   SARS Coronavirus 2 by RT PCR NEGATIVE NEGATIVE Final    Comment: (NOTE) SARS-CoV-2 target nucleic acids are NOT DETECTED.  The SARS-CoV-2 RNA is generally detectable in upper respiratory specimens during the acute phase of infection. The lowest concentration of SARS-CoV-2 viral copies this assay can detect is 138 copies/mL. A negative result does not preclude SARS-Cov-2 infection and should not be used as the sole basis for treatment or other patient management decisions. A negative result may occur with  improper specimen collection/handling, submission of specimen other than nasopharyngeal swab, presence  of viral mutation(s) within the areas targeted by this assay, and inadequate number of viral copies(<138 copies/mL). A negative result must be combined with clinical observations, patient history, and epidemiological information. The expected result is Negative.  Fact Sheet for Patients:  EntrepreneurPulse.com.au  Fact Sheet for Healthcare Providers:  IncredibleEmployment.be  This test is no t yet approved or cleared by the [place] FDA and  has been authorized for detection and/or diagnosis of SARS-CoV-2 by FDA under an Emergency Use Authorization (EUA). This EUA will remain  in effect (meaning this test can be used) for the duration of the COVID-19 declaration under Section 564(b)(1) of the Act, 21 U.S.C.section 360bbb-3(b)(1), unless the authorization is terminated  or revoked sooner.       Influenza A by PCR NEGATIVE NEGATIVE Final   Influenza B by PCR NEGATIVE NEGATIVE Final    Comment: (NOTE) The Xpert Xpress SARS-CoV-2/FLU/RSV plus assay is intended as an aid in the diagnosis of influenza from Nasopharyngeal  swab specimens and should not be used as a sole basis for treatment. Nasal washings and aspirates are unacceptable for Xpert Xpress SARS-CoV-2/FLU/RSV testing.  Fact Sheet for Patients: EntrepreneurPulse.com.au  Fact Sheet for Healthcare Providers: IncredibleEmployment.be  This test is not yet approved or cleared by the [place] FDA and has been authorized for detection and/or diagnosis of SARS-CoV-2 by FDA under an Emergency Use Authorization (EUA). This EUA will remain in effect (meaning this test can be used) for the duration of the COVID-19 declaration under Section 564(b)(1) of the Act, 21 U.S.C. section 360bbb-3(b)(1), unless the authorization is terminated or revoked.  Performed at [facility], [address] [place], [place] [address]          Radiology Studies: No results found.      Scheduled Meds:  apixaban  5 mg Oral BID   bisoprolol  5 mg Oral Daily   insulin aspart  0-15 Units Subcutaneous TID WC   insulin aspart  0-5 Units Subcutaneous QHS   ipratropium-albuterol  3 mL Inhalation Q6H   isosorbide mononitrate  30 mg Oral Daily   levothyroxine  300 mcg Oral QAC breakfast   loratadine  10 mg Oral QPM   mometasone-formoterol  2 puff Inhalation BID   pantoprazole  40 mg Oral Daily   potassium chloride  10 mEq Oral Daily   predniSONE  40 mg Oral Q breakfast   simvastatin  5 mg Oral q1800   sodium chloride flush  3 mL Intravenous Q12H   Continuous Infusions:  sodium chloride     furosemide (LASIX) 200 mg in dextrose 5% 100 mL (2mg /mL) infusion 8 mg/hr (02/01/21 0906)     LOS: 2 days    Time spent: 35 minutes    Penney Domanski Darleen Crocker, DO [facility]  If 7PM-7AM, please contact night-coverage [email or web address] 02/01/2021, 9:15 AM

## 2021-02-01 NOTE — Plan of Care (Signed)
  Problem: Education: Goal: Knowledge of General Education information will improve Description Including pain rating scale, medication(s)/side effects and non-pharmacologic comfort measures Outcome: Progressing   Problem: Health Behavior/Discharge Planning: Goal: Ability to manage health-related needs will improve Outcome: Progressing   

## 2021-02-02 LAB — BASIC METABOLIC PANEL
Anion gap: 11 (ref 5–15)
BUN: 61 mg/dL — ABNORMAL HIGH (ref 8–23)
CO2: 32 mmol/L (ref 22–32)
Calcium: 8.6 mg/dL — ABNORMAL LOW (ref 8.9–10.3)
Chloride: 97 mmol/L — ABNORMAL LOW (ref 98–111)
Creatinine, Ser: 2.61 mg/dL — ABNORMAL HIGH (ref 0.61–1.24)
GFR, Estimated: 25 mL/min — ABNORMAL LOW (ref >60)
Glucose, Bld: 109 mg/dL — ABNORMAL HIGH (ref 70–99)
Potassium: 4.2 mmol/L (ref 3.5–5.1)
Sodium: 140 mmol/L (ref 135–145)

## 2021-02-02 LAB — GLUCOSE, CAPILLARY
Glucose-Capillary: 94 mg/dL (ref 70–99)
Glucose-Capillary: 126 mg/dL — ABNORMAL HIGH (ref 70–99)
Glucose-Capillary: 185 mg/dL — ABNORMAL HIGH (ref 70–99)
Glucose-Capillary: 151 mg/dL — ABNORMAL HIGH (ref 70–99)

## 2021-02-02 LAB — MAGNESIUM: Magnesium: 2.5 mg/dL — ABNORMAL HIGH (ref 1.7–2.4)

## 2021-02-02 NOTE — Plan of Care (Signed)
  Problem: Health Behavior/Discharge Planning: Goal: Ability to manage health-related needs will improve Outcome: Progressing   Problem: Clinical Measurements: Goal: Ability to maintain clinical measurements within normal limits will improve Outcome: Progressing   

## 2021-02-02 NOTE — Progress Notes (Signed)
Patient placed on cpap with 4l 02 bleed in per patient request. Patient tolerated well. RT will continue to monitor.

## 2021-02-02 NOTE — Progress Notes (Signed)
PROGRESS NOTE    [name]  ZOX:[id number] DOB: 07/29/1946 DOA: 01/30/2021 PCP: Nickola Major, MD   Brief Narrative:   [name] [name] is a 75 y.o. male a history of CHF with preserved ejection fraction, CAD, atrial fibrillation Eliquis, stage 4 CKD, COPD hypertension, non-Hodgkin's lymphoma, diabetes presenting to the ED with increased shortness of breath over the past several hours. Patient recently admitted to the hospital and discharged 6 days ago for CHF and COPD exacerbation. Patient has had multiple ED presentations and admissions in the last six months for the same issue.  He has been started on IV Lasix infusion per cardiology on 7/1 with dose increased on 7/2 with ongoing diuresis noted.  His baseline weight appears to be near 139 kg.  He is agreeable to SNF on discharge.  Assessment & Plan:   Active Problems:   Chronic kidney disease Stage IV   Type 2 diabetes mellitus with stage 4 chronic kidney disease (HCC)   Hypertension   Acute on chronic respiratory failure with hypoxia and hypercapnia (HCC)   AF (paroxysmal atrial fibrillation) (HCC)   Acute on chronic diastolic CHF - patient presenting with orthopnea, increased O2 demand found to have slightly elevated BNP suggestive of CHF exacerbation. Patient was placed on BiPAP now on 5L [place] with improved breathing. Was 139 kg in April up to 142.2 kg today. UOP (600 ml) still not at target (>1L daily). Agrees to try rehab again. Cardiology following.  - Last echo 5/28 demonstrates EF 55-60% - Started Lasix infusion on 7/1 (changed from IV Lasix BID)-increased to 8mg /hr 7/2 - Continue Eliquis, Zebeta, Imdur, Zocor  - I&O, CMP, Weight daily  - Palliative consult obtained and pt wants full code/measures -Standing weight currently 141 kg with goal weight of 139 kg -Recent urine outputs have been inaccurate   Acute on chronic respiratory failure - secondary to CHF and COPD. Breathing improves, O2 sat 93% on 5L [place] (3-4L required  baseline). Afebrile, normal WBC, negative procalcitonin ruling against pneumonia. -May have mild COPD exacerbation for which prednisone and scheduled duo nebs will be provided starting 7/2 - Continue 5L , but wean further as tolerated - Continue home inhalers - Plan for short-term rehab at [facility] vs [facility]   Chronic Atrial fibrillation - regular rate at admission. - Cardiology consulted; appreciate recommendations - CHA2DS2-VASc score is 5 - Continue home eliquis for  stroke prophylaxis - Continue zebeta for heart rate control    Diabetes mellitus type 2 - BG upon admission 127. A1c 5.8%. On Tresiba 56 units qhs at home AM Glucose dropped to 48, improved to 74 with juice. -Continues to have some hypoglycemia for which Lantus will be discontinued for now   Chronic kidney disease stage IV - Creatine 2.36-2.55. - Follow kidney function with BMP daily - Has nephrology assessment at [facility] per cardiology - Plan to establish nephrology follow up at [place]   Hypothyroidism - last TSH 3/6 1.681 - Continue home levothyroxine      DVT prophylaxis:Eliquis Code Status: Full Family Communication: None at bedside Disposition Plan:  Status is: Inpatient   Remains inpatient appropriate because:IV treatments appropriate due to intensity of illness or inability to take PO   Dispo: The patient is from: Home              Anticipated d/c is to: SNF              Patient currently is not medically stable to d/c.  Difficult to place patient No     Nutritional Assessment:   The patient's BMI is: Body mass index is 41.3 kg/m.Marland Kitchen   Seen by dietician.  I agree with the assessment and plan as outlined below:   Nutrition Status: Consultants:  Cardiology Palliative   Procedures:  See below   Antimicrobials:  None   Subjective: Patient seen and evaluated today with no new acute complaints or concerns. No acute concerns or events noted overnight.  He would like to have  double his food portions and states that it is difficult for him to comply with his fluid restriction.  Objective: Vitals:   02/02/21 0418 02/02/21 0812 02/02/21 0818 02/02/21 1100  BP: (!) 144/78     Pulse: (!) 57     Resp: 17     Temp: 97.9 F (36.6 C)     TempSrc:      SpO2: 99% 92% 92%   Weight:    (!) 141.4 kg  Height:        Intake/Output Summary (Last 24 hours) at 02/02/2021 1121 Last data filed at 02/02/2021 0700 Gross per 24 hour  Intake 480 ml  Output 3100 ml  Net -2620 ml   Filed Weights   02/01/21 0500 02/02/21 0415 02/02/21 1100  Weight: (!) 142 kg (!) 143 kg (!) 141.4 kg    Examination:  General exam: Appears calm and comfortable, obese Respiratory system: Clear to auscultation. Respiratory effort normal.  Currently on nasal cannula Cardiovascular system: S1 & S2 heard, RRR.  Gastrointestinal system: Abdomen is soft Central nervous system: Alert and awake Extremities: Ongoing 1-2+ pitting edema noted to bilateral lower extremities Skin: No significant lesions noted Psychiatry: Flat affect.    Data Reviewed: I have personally reviewed following labs and imaging studies  CBC: Recent Labs  Lab 01/30/21 0312 01/31/21 0511  WBC 8.4 9.7  NEUTROABS 7.5  --   HGB 15.3 14.0  HCT 51.0 47.7  MCV 94.4 94.8  PLT 164 829   Basic Metabolic Panel: Recent Labs  Lab 01/30/21 0312 01/31/21 0511 02/01/21 0733 02/02/21 0440  NA 141 144 141 140  K 3.9 3.4* 3.6 4.2  CL 102 100 98 97*  CO2 31 35* 31 32  GLUCOSE 127* 48* 69* 109*  BUN 53* 58* 58* 61*  CREATININE 2.36* 2.55* 2.48* 2.61*  CALCIUM 8.9 8.7* 8.7* 8.6*  MG  --  2.7* 2.5* 2.5*   GFR: Estimated Creatinine Clearance: 37.3 mL/min (A) (by C-G formula based on SCr of 2.61 mg/dL (H)). Liver Function Tests: No results for input(s): AST, ALT, ALKPHOS, BILITOT, PROT, ALBUMIN in the last 168 hours. No results for input(s): LIPASE, AMYLASE in the last 168 hours. No results for input(s): AMMONIA in the last  168 hours. Coagulation Profile: No results for input(s): INR, PROTIME in the last 168 hours. Cardiac Enzymes: No results for input(s): CKTOTAL, CKMB, CKMBINDEX, TROPONINI in the last 168 hours. BNP (last 3 results) No results for input(s): PROBNP in the last 8760 hours. HbA1C: No results for input(s): HGBA1C in the last 72 hours. CBG: Recent Labs  Lab 02/01/21 1112 02/01/21 1606 02/01/21 2124 02/02/21 0733 02/02/21 1109  GLUCAP 134* 165* 209* 94 126*   Lipid Profile: No results for input(s): CHOL, HDL, LDLCALC, TRIG, CHOLHDL, LDLDIRECT in the last 72 hours. Thyroid Function Tests: No results for input(s): TSH, T4TOTAL, FREET4, T3FREE, THYROIDAB in the last 72 hours. Anemia Panel: No results for input(s): VITAMINB12, FOLATE, FERRITIN, TIBC, IRON, RETICCTPCT in the last 72  hours. Sepsis Labs: Recent Labs  Lab 01/30/21 0518  PROCALCITON <0.10    Recent Results (from the past 240 hour(s))  Resp Panel by RT-PCR (Flu A&B, Covid) Nasopharyngeal Swab     Status: None   Collection Time: 01/23/21  2:36 PM   Specimen: Nasopharyngeal Swab; Nasopharyngeal(NP) swabs in vial transport medium  Result Value Ref Range Status   SARS Coronavirus 2 by RT PCR NEGATIVE NEGATIVE Final    Comment: (NOTE) SARS-CoV-2 target nucleic acids are NOT DETECTED.  The SARS-CoV-2 RNA is generally detectable in upper respiratory specimens during the acute phase of infection. The lowest concentration of SARS-CoV-2 viral copies this assay can detect is 138 copies/mL. A negative result does not preclude SARS-Cov-2 infection and should not be used as the sole basis for treatment or other patient management decisions. A negative result may occur with  improper specimen collection/handling, submission of specimen other than nasopharyngeal swab, presence of viral mutation(s) within the areas targeted by this assay, and inadequate number of viral copies(<138 copies/mL). A negative result must be combined  with clinical observations, patient history, and epidemiological information. The expected result is Negative.  Fact Sheet for Patients:  EntrepreneurPulse.com.au  Fact Sheet for Healthcare Providers:  IncredibleEmployment.be  This test is no t yet approved or cleared by the [place] FDA and  has been authorized for detection and/or diagnosis of SARS-CoV-2 by FDA under an Emergency Use Authorization (EUA). This EUA will remain  in effect (meaning this test can be used) for the duration of the COVID-19 declaration under Section 564(b)(1) of the Act, 21 U.S.C.section 360bbb-3(b)(1), unless the authorization is terminated  or revoked sooner.       Influenza A by PCR NEGATIVE NEGATIVE Final   Influenza B by PCR NEGATIVE NEGATIVE Final    Comment: (NOTE) The Xpert Xpress SARS-CoV-2/FLU/RSV plus assay is intended as an aid in the diagnosis of influenza from Nasopharyngeal swab specimens and should not be used as a sole basis for treatment. Nasal washings and aspirates are unacceptable for Xpert Xpress SARS-CoV-2/FLU/RSV testing.  Fact Sheet for Patients: EntrepreneurPulse.com.au  Fact Sheet for Healthcare Providers: IncredibleEmployment.be  This test is not yet approved or cleared by the [place] FDA and has been authorized for detection and/or diagnosis of SARS-CoV-2 by FDA under an Emergency Use Authorization (EUA). This EUA will remain in effect (meaning this test can be used) for the duration of the COVID-19 declaration under Section 564(b)(1) of the Act, 21 U.S.C. section 360bbb-3(b)(1), unless the authorization is terminated or revoked.  Performed at [facility], [address], [place], [place] [address]   Resp Panel by RT-PCR (Flu A&B, Covid) Nasopharyngeal Swab     Status: None   Collection Time: 01/30/21  2:21 AM   Specimen: Nasopharyngeal Swab; Nasopharyngeal(NP) swabs in vial transport  medium  Result Value Ref Range Status   SARS Coronavirus 2 by RT PCR NEGATIVE NEGATIVE Final    Comment: (NOTE) SARS-CoV-2 target nucleic acids are NOT DETECTED.  The SARS-CoV-2 RNA is generally detectable in upper respiratory specimens during the acute phase of infection. The lowest concentration of SARS-CoV-2 viral copies this assay can detect is 138 copies/mL. A negative result does not preclude SARS-Cov-2 infection and should not be used as the sole basis for treatment or other patient management decisions. A negative result may occur with  improper specimen collection/handling, submission of specimen other than nasopharyngeal swab, presence of viral mutation(s) within the areas targeted by this assay, and inadequate number of viral copies(<138  copies/mL). A negative result must be combined with clinical observations, patient history, and epidemiological information. The expected result is Negative.  Fact Sheet for Patients:  EntrepreneurPulse.com.au  Fact Sheet for Healthcare Providers:  IncredibleEmployment.be  This test is no t yet approved or cleared by the [place] FDA and  has been authorized for detection and/or diagnosis of SARS-CoV-2 by FDA under an Emergency Use Authorization (EUA). This EUA will remain  in effect (meaning this test can be used) for the duration of the COVID-19 declaration under Section 564(b)(1) of the Act, 21 U.S.C.section 360bbb-3(b)(1), unless the authorization is terminated  or revoked sooner.       Influenza A by PCR NEGATIVE NEGATIVE Final   Influenza B by PCR NEGATIVE NEGATIVE Final    Comment: (NOTE) The Xpert Xpress SARS-CoV-2/FLU/RSV plus assay is intended as an aid in the diagnosis of influenza from Nasopharyngeal swab specimens and should not be used as a sole basis for treatment. Nasal washings and aspirates are unacceptable for Xpert Xpress SARS-CoV-2/FLU/RSV testing.  Fact Sheet for  Patients: EntrepreneurPulse.com.au  Fact Sheet for Healthcare Providers: IncredibleEmployment.be  This test is not yet approved or cleared by the [place] FDA and has been authorized for detection and/or diagnosis of SARS-CoV-2 by FDA under an Emergency Use Authorization (EUA). This EUA will remain in effect (meaning this test can be used) for the duration of the COVID-19 declaration under Section 564(b)(1) of the Act, 21 U.S.C. section 360bbb-3(b)(1), unless the authorization is terminated or revoked.  Performed at [facility], [address], [place], [place] [address]          Radiology Studies: No results found.      Scheduled Meds:  apixaban  5 mg Oral BID   bisoprolol  5 mg Oral Daily   insulin aspart  0-15 Units Subcutaneous TID WC   insulin aspart  0-5 Units Subcutaneous QHS   ipratropium-albuterol  3 mL Inhalation Q6H   isosorbide mononitrate  30 mg Oral Daily   levothyroxine  300 mcg Oral QAC breakfast   loratadine  10 mg Oral QPM   mometasone-formoterol  2 puff Inhalation BID   pantoprazole  40 mg Oral Daily   potassium chloride  10 mEq Oral Daily   predniSONE  40 mg Oral Q breakfast   simvastatin  5 mg Oral q1800   sodium chloride flush  3 mL Intravenous Q12H   Continuous Infusions:  sodium chloride     furosemide (LASIX) 200 mg in dextrose 5% 100 mL (2mg /mL) infusion 8 mg/hr (02/01/21 1736)     LOS: 3 days    Time spent: 35 minutes    Zoltan Genest Darleen Crocker, DO [facility]  If 7PM-7AM, please contact night-coverage [email or web address] 02/02/2021, 11:21 AM

## 2021-02-03 LAB — BASIC METABOLIC PANEL
Anion gap: 10 (ref 5–15)
BUN: 62 mg/dL — ABNORMAL HIGH (ref 8–23)
CO2: 33 mmol/L — ABNORMAL HIGH (ref 22–32)
Calcium: 8.5 mg/dL — ABNORMAL LOW (ref 8.9–10.3)
Chloride: 95 mmol/L — ABNORMAL LOW (ref 98–111)
Creatinine, Ser: 2.53 mg/dL — ABNORMAL HIGH (ref 0.61–1.24)
GFR, Estimated: 26 mL/min — ABNORMAL LOW (ref >60)
Glucose, Bld: 94 mg/dL (ref 70–99)
Potassium: 3.9 mmol/L (ref 3.5–5.1)
Sodium: 138 mmol/L (ref 135–145)

## 2021-02-03 LAB — GLUCOSE, CAPILLARY
Glucose-Capillary: 87 mg/dL (ref 70–99)
Glucose-Capillary: 126 mg/dL — ABNORMAL HIGH (ref 70–99)
Glucose-Capillary: 158 mg/dL — ABNORMAL HIGH (ref 70–99)
Glucose-Capillary: 164 mg/dL — ABNORMAL HIGH (ref 70–99)

## 2021-02-03 LAB — MAGNESIUM: Magnesium: 2.4 mg/dL (ref 1.7–2.4)

## 2021-02-03 MED ORDER — FUROSEMIDE 10 MG/ML IJ SOLN
INTRAMUSCULAR | Status: AC
  Filled 2021-02-03: qty 20

## 2021-02-03 NOTE — TOC Progression Note (Signed)
Transition of Care [facility]) - Progression Note    Patient Details  Name: [name] [name] MRN: [id number] Date of Birth: March 13, 1946  [facility] of [facility]) CM/SW Contact  Natasha Bence, LCSW Phone Number: 02/03/2021, 2:11 PM  Clinical Narrative:    Josem Kaufmann started for patient. TOC to follow.    Expected Discharge Plan: [place] Barriers to Discharge: Continued Medical Work up  Expected Discharge Plan and Services Expected Discharge Plan: [place] In-house Referral: Clinical Social Work   Post Acute Care Choice: [place] Living arrangements for the past 2 months: Single Family Home                                       Social Determinants of Health (SDOH) Interventions    Readmission Risk Interventions Readmission Risk Prevention Plan 01/31/2021 12/29/2020 10/07/2020  Transportation Screening Complete Complete Complete  Medication Review Press photographer) - Complete Complete  PCP or Specialist appointment within 3-5 days of discharge - Complete -  [place] or Home Care Consult Complete Complete Complete  SW Recovery Care/Counseling Consult Complete Complete Complete  Palliative Care Screening Complete Not Applicable Not [place] Complete Complete Not Applicable  Some recent data might be hidden

## 2021-02-03 NOTE — Plan of Care (Signed)

## 2021-02-03 NOTE — Progress Notes (Signed)
PROGRESS NOTE    [name] [name]  VOZ:[id number] DOB: 1946/02/22 DOA: 01/30/2021 PCP: [name] Major, MD   Brief Narrative:   [name] [name] is a 75 y.o. male a history of CHF with preserved ejection fraction, CAD, atrial fibrillation Eliquis, stage 4 CKD, COPD hypertension, non-Hodgkin's lymphoma, diabetes presenting to the ED with increased shortness of breath over the past several hours. Patient recently admitted to the hospital and discharged 6 days ago for CHF and COPD exacerbation. Patient has had multiple ED presentations and admissions in the last six months for the same issue.  He has been started on IV Lasix infusion per cardiology on 7/1 with dose increased on 7/2 with ongoing diuresis noted.  His baseline weight appears to be near 139 kg.  He is agreeable to SNF on discharge.  Assessment & Plan:   Active Problems:   Chronic kidney disease Stage IV   Type 2 diabetes mellitus with stage 4 chronic kidney disease (HCC)   Hypertension   Acute on chronic respiratory failure with hypoxia and hypercapnia (HCC)   AF (paroxysmal atrial fibrillation) (HCC)   Acute on chronic diastolic CHF - patient presenting with orthopnea, increased O2 demand found to have slightly elevated BNP suggestive of CHF exacerbation. Patient was placed on BiPAP now on 5L [place] with improved breathing. Was 139 kg in April up to 142.2 kg today. UOP (600 ml) still not at target (>1L daily). Agrees to try rehab again. Cardiology following.  - Last echo 5/28 demonstrates EF 55-60% - Started Lasix infusion on 7/1 (changed from IV Lasix BID)-increased to 8mg /hr 7/2 - Continue Eliquis, Zebeta, Imdur, Zocor  - I&O, CMP, Weight daily  - Palliative consult obtained and pt wants full code/measures -Standing weight currently 141 kg with goal weight of 139 kg -Recent urine outputs have been inaccurate -Continues to have approximately 1.5-2 L urine output every 24 hours   Acute on chronic respiratory failure - secondary to  CHF and COPD. Breathing improves, O2 sat 93% on 5L [place] (3-4L required baseline). Afebrile, normal WBC, negative procalcitonin ruling against pneumonia. -May have mild COPD exacerbation for which prednisone and scheduled duo nebs will be provided starting 7/2 - Continue 5L [place], but wean further as tolerated - Continue home inhalers - Plan for [facility] at [facility] vs [facility]   Chronic Atrial fibrillation - regular rate at admission. - Cardiology consulted; appreciate recommendations - CHA2DS2-VASc score is 5 - Continue home eliquis for  stroke prophylaxis - Continue zebeta for heart rate control    Diabetes mellitus type 2 - BG upon admission 127. A1c 5.8%. On Tresiba 56 units qhs at home AM Glucose dropped to 48, improved to 74 with juice. -Blood glucose well controlled off of Lantus   Chronic kidney disease stage IV - Creatine 2.36-2.55. - Follow kidney function with BMP daily - Has nephrology assessment at [facility] per cardiology - Plan to establish nephrology follow up at [place]   Hypothyroidism - last TSH 3/6 1.681 - Continue home levothyroxine      DVT prophylaxis:Eliquis Code Status: Full Family Communication: None at bedside Disposition Plan:  Status is: Inpatient   Remains inpatient appropriate because:IV treatments appropriate due to intensity of illness or inability to take PO   Dispo: The patient is from: Home              Anticipated d/c is to: SNF              Patient currently is not  medically stable to d/c.              Difficult to place patient No     Consultants:  Cardiology Palliative   Procedures:  See below   Antimicrobials:  None  Subjective: Patient seen and evaluated today with no new acute complaints or concerns. No acute concerns or events noted overnight.  He continues to feel better each day as he continues to diurese.  He would like to have something more to drink.  Objective: Vitals:   02/03/21 0500 02/03/21 0559 02/03/21  0700 02/03/21 0721  BP:  (!) 132/92    Pulse:  74    Resp:  18    Temp:  (!) 97.4 F (36.3 C)    TempSrc:  Oral    SpO2:  95% 97% 97%  Weight: (!) 141 kg     Height:        Intake/Output Summary (Last 24 hours) at 02/03/2021 0951 Last data filed at 02/03/2021 0908 Gross per 24 hour  Intake 717 ml  Output 2500 ml  Net -1783 ml   Filed Weights   02/02/21 0415 02/02/21 1100 02/03/21 0500  Weight: (!) 143 kg (!) 141.4 kg (!) 141 kg    Examination:  General exam: Appears calm and comfortable, obese Respiratory system: Clear to auscultation. Respiratory effort normal.  Nasal cannula oxygen Cardiovascular system: S1 & S2 heard, RRR.  Gastrointestinal system: Abdomen is soft Central nervous system: Alert and awake Extremities: Bilateral 2+ pitting edema Skin: No significant lesions noted Psychiatry: Flat affect.    Data Reviewed: I have personally reviewed following labs and imaging studies  CBC: Recent Labs  Lab 01/30/21 0312 01/31/21 0511  WBC 8.4 9.7  NEUTROABS 7.5  --   HGB 15.3 14.0  HCT 51.0 47.7  MCV 94.4 94.8  PLT 164 355   Basic Metabolic Panel: Recent Labs  Lab 01/30/21 0312 01/31/21 0511 02/01/21 0733 02/02/21 0440 02/03/21 0641  NA 141 144 141 140 138  K 3.9 3.4* 3.6 4.2 3.9  CL 102 100 98 97* 95*  CO2 31 35* 31 32 33*  GLUCOSE 127* 48* 69* 109* 94  BUN 53* 58* 58* 61* 62*  CREATININE 2.36* 2.55* 2.48* 2.61* 2.53*  CALCIUM 8.9 8.7* 8.7* 8.6* 8.5*  MG  --  2.7* 2.5* 2.5* 2.4   GFR: Estimated Creatinine Clearance: 38.4 mL/min (A) (by C-G formula based on SCr of 2.53 mg/dL (H)). Liver Function Tests: No results for input(s): AST, ALT, ALKPHOS, BILITOT, PROT, ALBUMIN in the last 168 hours. No results for input(s): LIPASE, AMYLASE in the last 168 hours. No results for input(s): AMMONIA in the last 168 hours. Coagulation Profile: No results for input(s): INR, PROTIME in the last 168 hours. Cardiac Enzymes: No results for input(s): CKTOTAL, CKMB,  CKMBINDEX, TROPONINI in the last 168 hours. BNP (last 3 results) No results for input(s): PROBNP in the last 8760 hours. HbA1C: No results for input(s): HGBA1C in the last 72 hours. CBG: Recent Labs  Lab 02/02/21 0733 02/02/21 1109 02/02/21 1610 02/02/21 2245 02/03/21 0734  GLUCAP 94 126* 185* 151* 87   Lipid Profile: No results for input(s): CHOL, HDL, LDLCALC, TRIG, CHOLHDL, LDLDIRECT in the last 72 hours. Thyroid Function Tests: No results for input(s): TSH, T4TOTAL, FREET4, T3FREE, THYROIDAB in the last 72 hours. Anemia Panel: No results for input(s): VITAMINB12, FOLATE, FERRITIN, TIBC, IRON, RETICCTPCT in the last 72 hours. Sepsis Labs: Recent Labs  Lab 01/30/21 0518  PROCALCITON <0.10  Recent Results (from the past 240 hour(s))  Resp Panel by RT-PCR (Flu A&B, Covid) Nasopharyngeal Swab     Status: None   Collection Time: 01/30/21  2:21 AM   Specimen: Nasopharyngeal Swab; Nasopharyngeal(NP) swabs in vial transport medium  Result Value Ref Range Status   SARS Coronavirus 2 by RT PCR NEGATIVE NEGATIVE Final    Comment: (NOTE) SARS-CoV-2 target nucleic acids are NOT DETECTED.  The SARS-CoV-2 RNA is generally detectable in upper respiratory specimens during the acute phase of infection. The lowest concentration of SARS-CoV-2 viral copies this assay can detect is 138 copies/mL. A negative result does not preclude SARS-Cov-2 infection and should not be used as the sole basis for treatment or other patient management decisions. A negative result may occur with  improper specimen collection/handling, submission of specimen other than nasopharyngeal swab, presence of viral mutation(s) within the areas targeted by this assay, and inadequate number of viral copies(<138 copies/mL). A negative result must be combined with clinical observations, patient history, and epidemiological information. The expected result is Negative.  Fact Sheet for Patients:   EntrepreneurPulse.com.au  Fact Sheet for Healthcare Providers:  IncredibleEmployment.be  This test is no t yet approved or cleared by the [place] FDA and  has been authorized for detection and/or diagnosis of SARS-CoV-2 by FDA under an Emergency Use Authorization (EUA). This EUA will remain  in effect (meaning this test can be used) for the duration of the COVID-19 declaration under Section 564(b)(1) of the Act, 21 U.S.C.section 360bbb-3(b)(1), unless the authorization is terminated  or revoked sooner.       Influenza A by PCR NEGATIVE NEGATIVE Final   Influenza B by PCR NEGATIVE NEGATIVE Final    Comment: (NOTE) The Xpert Xpress SARS-CoV-2/FLU/RSV plus assay is intended as an aid in the diagnosis of influenza from Nasopharyngeal swab specimens and should not be used as a sole basis for treatment. Nasal washings and aspirates are unacceptable for Xpert Xpress SARS-CoV-2/FLU/RSV testing.  Fact Sheet for Patients: EntrepreneurPulse.com.au  Fact Sheet for Healthcare Providers: IncredibleEmployment.be  This test is not yet approved or cleared by the [place] FDA and has been authorized for detection and/or diagnosis of SARS-CoV-2 by FDA under an Emergency Use Authorization (EUA). This EUA will remain in effect (meaning this test can be used) for the duration of the COVID-19 declaration under Section 564(b)(1) of the Act, 21 U.S.C. section 360bbb-3(b)(1), unless the authorization is terminated or revoked.  Performed at [facility], [address] [place], [place] [address]          Radiology Studies: No results found.      Scheduled Meds:  apixaban  5 mg Oral BID   bisoprolol  5 mg Oral Daily   insulin aspart  0-15 Units Subcutaneous TID WC   insulin aspart  0-5 Units Subcutaneous QHS   ipratropium-albuterol  3 mL Inhalation Q6H   isosorbide mononitrate  30 mg Oral Daily    levothyroxine  300 mcg Oral QAC breakfast   loratadine  10 mg Oral QPM   mometasone-formoterol  2 puff Inhalation BID   pantoprazole  40 mg Oral Daily   potassium chloride  10 mEq Oral Daily   predniSONE  40 mg Oral Q breakfast   simvastatin  5 mg Oral q1800   sodium chloride flush  3 mL Intravenous Q12H   Continuous Infusions:  sodium chloride     furosemide (LASIX) 200 mg in dextrose 5% 100 mL (2mg /mL) infusion 8 mg/hr (02/02/21 2233)  LOS: 4 days    Time spent: 35 minutes    Damen Windsor Darleen Crocker, DO [facility]  If 7PM-7AM, please contact night-coverage [email or web address] 02/03/2021, 9:51 AM

## 2021-02-03 NOTE — Plan of Care (Signed)
  Problem: Acute Rehab PT Goals(only PT should resolve) Goal: Patient Will Transfer Sit To/From Stand Outcome: Progressing Flowsheets (Taken 02/03/2021 1137) Patient will transfer sit to/from stand: with modified independence Goal: Pt Will Transfer Bed To Chair/Chair To Bed Outcome: Progressing Flowsheets (Taken 02/03/2021 1137) Pt will Transfer Bed to Chair/Chair to Bed: with modified independence Goal: Pt Will Ambulate Outcome: Progressing Flowsheets (Taken 02/03/2021 1137) Pt will Ambulate:  50 feet  with modified independence  with least restrictive assistive device Goal: Pt/caregiver will Perform Home Exercise Program Outcome: Progressing Flowsheets (Taken 02/03/2021 1137) Pt/caregiver will Perform Home Exercise Program:  For increased strengthening  For improved balance  Independently  11:39 AM, 02/03/21 Mearl Latin PT, DPT Physical Therapist at [facility]

## 2021-02-03 NOTE — Evaluation (Signed)
Physical Therapy Evaluation Patient Details Name: [name] [name] MRN: [id number] DOB: 06/24/1946 Today's Date: 02/03/2021   History of Present Illness  [name] [name] is a 75 y.o. male  a history of CHF with preserved ejection fraction, CAD, atrial fibrillation Eliquis, stage 4 CKD, COPD hypertension, non-Hodgkin's lymphoma, diabetes presenting to the ED with increased shortness of breath over the past several hours. Patient recently admitted to the hospital and discharged 6 days ago for CHF and COPD exacerbation. Patient has had multiple ED presentations and admissions in the last six months for the same issue. Patient was on BiPAP and had difficulty engaging in conversation. States adherence with his medications including Lasix and denies any missed dose. States that he's breathing better with BiPAP now than he was at home. No fever or chest pain. Denies continued epistaxis. On 3-4 L O2 chronically. Denies using BiPAP/CPAP at home. Per ED notes, patient was supposed to undergo sleep study to see if he's qualified for CPAP use but has not yet done so. The history was limited by the condition of the patient.   Clinical Impression  Patient limited for functional mobility as stated below secondary to BLE weakness, fatigue and impaired standing balance. Patient seated in chair at beginning of session with O2 sat 94% at rest on 4 L. Patient transfers to standing with RW and ambulates with labored cadence in room. Patient given intermittent cueing for staying within RW and proper RW use. Patient O2 sat decreases to 80% with ambulation on 4L.  Patient will benefit from continued physical therapy in hospital and recommended venue below to increase strength, balance, endurance for safe ADLs and gait.     Follow Up Recommendations SNF    Equipment Recommendations       Recommendations for Other Services       Precautions / Restrictions Precautions Precautions: Fall Restrictions Weight Bearing  Restrictions: No      Mobility  Bed Mobility               General bed mobility comments: seated in chair at beginning of session    Transfers Overall transfer level: Needs assistance Equipment used: Rolling walker (2 wheeled) Transfers: Sit to/from Stand;Stand Pivot Transfers Sit to Stand: Min assist Stand pivot transfers: Min guard       General transfer comment: slow labored movement  Ambulation/Gait Ambulation/Gait assistance: Min guard;Min assist Gait Distance (Feet): 30 Feet Assistive device: Rolling walker (2 wheeled) Gait Pattern/deviations: Decreased step length - right;Decreased step length - left;Decreased stride length Gait velocity: decreased   General Gait Details: slow labored cadence without loss of balance, limited due to SOB and fatigue,  cueing for proper RW use  Stairs            Wheelchair Mobility    Modified Rankin (Stroke Patients Only)       Balance Overall balance assessment: Needs assistance Sitting-balance support: Feet supported;No upper extremity supported Sitting balance-Leahy Scale: Good Sitting balance - Comments: seated at EOB   Standing balance support: During functional activity;Bilateral upper extremity supported Standing balance-Leahy Scale: Fair Standing balance comment: fair/good using RW                             Pertinent Vitals/Pain Pain Assessment: No/denies pain    Home Living Family/patient expects to be discharged to:: Private residence Living Arrangements: Children Available Help at Discharge: Family;Available PRN/intermittently Type of Home: House Home Access: Ramped entrance;Stairs to enter  Entrance Stairs-Rails: Right;Left;Can reach both Entrance Stairs-Number of Steps: 1 Home Layout: One level Home Equipment: Shower seat;Bedside commode;Walker - 2 wheels;Cane - single point;Toilet riser;Electric scooter;Walker - 4 wheels Additional Comments: getting a hospital bed in the next  few weeks    Prior Function Level of Independence: Needs assistance   Gait / Transfers Assistance Needed: household ambulator using Rollator or RW, on 3-4 LPM home O2 constant, uses electric scooter for longer distances  ADL's / Homemaking Assistance Needed: assisted by family for community ADLs        Hand Dominance   Dominant Hand: Right    Extremity/Trunk Assessment   Upper Extremity Assessment Upper Extremity Assessment: Generalized weakness    Lower Extremity Assessment Lower Extremity Assessment: Generalized weakness    Cervical / Trunk Assessment Cervical / Trunk Assessment: Kyphotic  Communication   Communication: No difficulties  Cognition Arousal/Alertness: Awake/alert Behavior During Therapy: WFL for tasks assessed/performed Overall Cognitive Status: Within Functional Limits for tasks assessed                                        General Comments      Exercises     Assessment/Plan    PT Assessment Patient needs continued PT services  PT Problem List Decreased strength;Decreased activity tolerance;Decreased mobility;Decreased balance;Cardiopulmonary status limiting activity       PT Treatment Interventions DME instruction;Gait training;Stair training;Functional mobility training;Therapeutic activities;Therapeutic exercise;Patient/family education;Balance training    PT Goals (Current goals can be found in the Care Plan section)  Acute Rehab PT Goals Patient Stated Goal: return home PT Goal Formulation: With patient Time For Goal Achievement: 02/17/21 Potential to Achieve Goals: Good    Frequency Min 3X/week   Barriers to discharge        Co-evaluation               AM-PAC PT "6 Clicks" Mobility  Outcome Measure Help needed turning from your back to your side while in a flat bed without using bedrails?: None Help needed moving from lying on your back to sitting on the side of a flat bed without using bedrails?: A  Little Help needed moving to and from a bed to a chair (including a wheelchair)?: A Little Help needed standing up from a chair using your arms (e.g., wheelchair or bedside chair)?: A Little Help needed to walk in hospital room?: A Little Help needed climbing 3-5 steps with a railing? : A Lot 6 Click Score: 18    End of Session Equipment Utilized During Treatment: Oxygen Activity Tolerance: Patient tolerated treatment well;Patient limited by fatigue Patient left: in chair;with call bell/phone within reach Nurse Communication: Mobility status PT Visit Diagnosis: Unsteadiness on feet (R26.81);Other abnormalities of gait and mobility (R26.89);Muscle weakness (generalized) (M62.81)    Time: 6967-8938 PT Time Calculation (min) (ACUTE ONLY): 14 min   Charges:   PT Evaluation $PT Eval Moderate Complexity: 1 Mod          11:36 AM, 02/03/21 Mearl Latin PT, DPT Physical Therapist at [facility]

## 2021-02-04 LAB — GLUCOSE, CAPILLARY
Glucose-Capillary: 123 mg/dL — ABNORMAL HIGH (ref 70–99)
Glucose-Capillary: 103 mg/dL — ABNORMAL HIGH (ref 70–99)
Glucose-Capillary: 181 mg/dL — ABNORMAL HIGH (ref 70–99)
Glucose-Capillary: 193 mg/dL — ABNORMAL HIGH (ref 70–99)

## 2021-02-04 LAB — BASIC METABOLIC PANEL
Anion gap: 11 (ref 5–15)
BUN: 61 mg/dL — ABNORMAL HIGH (ref 8–23)
CO2: 31 mmol/L (ref 22–32)
Calcium: 8.4 mg/dL — ABNORMAL LOW (ref 8.9–10.3)
Chloride: 97 mmol/L — ABNORMAL LOW (ref 98–111)
Creatinine, Ser: 2.49 mg/dL — ABNORMAL HIGH (ref 0.61–1.24)
GFR, Estimated: 27 mL/min — ABNORMAL LOW (ref >60)
Glucose, Bld: 159 mg/dL — ABNORMAL HIGH (ref 70–99)
Potassium: 3.3 mmol/L — ABNORMAL LOW (ref 3.5–5.1)
Sodium: 139 mmol/L (ref 135–145)

## 2021-02-04 LAB — MAGNESIUM: Magnesium: 2.4 mg/dL (ref 1.7–2.4)

## 2021-02-04 MED ORDER — IPRATROPIUM-ALBUTEROL 0.5-2.5 (3) MG/3ML IN SOLN
3.0000 mL | Freq: Three times a day (TID) | RESPIRATORY_TRACT | Status: DC
  Administered 2021-02-04 – 2021-02-07 (×10): 3 mL via RESPIRATORY_TRACT
  Filled 2021-02-04 (×10): qty 3

## 2021-02-04 MED ORDER — POTASSIUM CHLORIDE CRYS ER 20 MEQ PO TBCR
40.0000 meq | EXTENDED_RELEASE_TABLET | Freq: Once | ORAL | Status: AC
  Administered 2021-02-04: 40 meq via ORAL
  Filled 2021-02-04: qty 2

## 2021-02-04 MED ORDER — METOLAZONE 5 MG PO TABS
5.0000 mg | ORAL_TABLET | Freq: Once | ORAL | Status: AC
  Administered 2021-02-04: 5 mg via ORAL
  Filled 2021-02-04: qty 1

## 2021-02-04 MED ORDER — SALINE SPRAY 0.65 % NA SOLN: 1.0000 | NASAL | Status: DC | PRN

## 2021-02-04 NOTE — Progress Notes (Signed)
Palliative: [name], [name] [name], is sitting up in the [name] [name] chair in his room.  He greets me making and mostly keeping eye contact.  He appears chronically ill, morbidly obese.  He is alert and oriented x3, able to make his basic needs known.  There is no family at bedside at this time.  We talk in detail about his multiple hospitalizations for heart failure.  We talked about heart failure as a progressive disease.  We also talked about heart failure management with fluid restrictions and low-salt diet.  [name] [name] tells me this morning that he has had 3 cups of liquid, and realizes he should not have more than 8 in 1 day.  We talked about short-term rehab for strength and balance.  [name] [name] shares that he would go to short-term rehab if offered a bed in an acceptable facility.  We talked at length about CODE STATUS.  [name] [name] tells me that he would not accept life support, but then tells me that he wants his daughter to decide.  He shares he has not had CODE STATUS discussions with his daughter/HC POA, [name] [name].  They were to have outpatient palliative services, but he was readmitted before they were able to visit.  [name] [name] tells me that he is going to the lawyers office soon, and they will talk more about CODE STATUS and his choices.  Conference with attending, bedside nursing staff, transition of care team related to patient condition, needs, goals of care, disposition.  Plan: Continue full scope/full code.  Continue to rehospitalize as needed.  Outpatient palliative services to follow.  10 minutes [name] Axe, NP Palliative medicine team Team phone [phone] Greater than 50% of this time was spent counseling and coordinating care related to the above assessment and plan.

## 2021-02-04 NOTE — Progress Notes (Signed)
PROGRESS NOTE    [name] BANWART  [name]:[id number] DOB: 11/15/1945 DOA: 01/30/2021 PCP: Nickola Major, MD   Brief Narrative:   [name] [name] is a 75 y.o. male a history of CHF with preserved ejection fraction, CAD, atrial fibrillation Eliquis, stage 4 CKD, COPD hypertension, non-Hodgkin's lymphoma, diabetes presenting to the ED with increased shortness of breath over the past several hours. Patient recently admitted to the hospital and discharged 6 days ago for CHF and COPD exacerbation. Patient has had multiple ED presentations and admissions in the last six months for the same issue.  He has been started on IV Lasix infusion per cardiology on 7/1 with dose increased on 7/2 with ongoing diuresis noted.  His baseline weight appears to be near 139 kg.  He is agreeable to SNF on discharge.  Assessment & Plan:   Active Problems:   Chronic kidney disease Stage IV   Type 2 diabetes mellitus with stage 4 chronic kidney disease (HCC)   Hypertension   Acute on chronic respiratory failure with hypoxia and hypercapnia (HCC)   AF (paroxysmal atrial fibrillation) (HCC)   Acute on chronic diastolic CHF - patient presenting with orthopnea, increased O2 demand found to have slightly elevated BNP suggestive of CHF exacerbation. Patient was placed on BiPAP now on 5L [place] with improved breathing. Was 139 kg in April up to 142.2 kg today. UOP (600 ml) still not at target (>1L daily). Agrees to try rehab again. Cardiology following.  - Last echo 5/28 demonstrates EF 55-60% - Started Lasix infusion on 7/1 (changed from IV Lasix BID)-increased to 8mg /hr 7/2 - Continue Eliquis, Zebeta, Imdur, Zocor  - I&O, CMP, Weight daily  - Palliative consult obtained and pt wants full code/measures -Standing weight currently 141 kg with goal weight of 139 kg -Recent urine outputs have been inaccurate -Metolazone 5mg  on 7/5   Acute on chronic respiratory failure - secondary to CHF and COPD. Breathing improves, O2 sat 93% on  5L [place] (3-4L required baseline). Afebrile, normal WBC, negative procalcitonin ruling against pneumonia. -May have mild COPD exacerbation for which prednisone and scheduled duo nebs will be provided starting 7/2 - Continue 5L [place], but wean further as tolerated - Continue home inhalers - Plan for short-term rehab at [facility]   Chronic Atrial fibrillation - regular rate at admission. - Cardiology consulted; appreciate recommendations - CHA2DS2-VASc score is 5 - Continue home eliquis for  stroke prophylaxis - Continue zebeta for heart rate control    Diabetes mellitus type 2 - BG upon admission 127. A1c 5.8%. On Tresiba 56 units qhs at home AM Glucose dropped to 48, improved to 74 with juice. -Blood glucose well controlled off of Lantus   Chronic kidney disease stage IV - Creatine 2.36-2.55. - Follow kidney function with BMP daily - Has nephrology assessment at [facility] per cardiology - Plan to establish nephrology follow up at [place]   Hypothyroidism - last TSH 3/6 1.681 - Continue home levothyroxine      DVT prophylaxis:Eliquis Code Status: Full Family Communication: None at bedside Disposition Plan:  Status is: Inpatient   Remains inpatient appropriate because:IV treatments appropriate due to intensity of illness or inability to take PO   Dispo: The patient is from: Home              Anticipated d/c is to: SNF              Patient currently is not medically stable to d/c.  Difficult to place patient No     Consultants:  Cardiology Palliative   Procedures:  See below   Antimicrobials:  None   Subjective: Patient seen and evaluated today with no new acute complaints or concerns. No acute concerns or events noted overnight.  His urine outputs appear to be inaccurate.  Objective: Vitals:   02/04/21 0355 02/04/21 0359 02/04/21 0736 02/04/21 0744  BP: 136/89     Pulse: 64     Resp: 18     Temp: 98.2 F (36.8 C)     TempSrc:      SpO2: 99%  95%  95%  Weight:  (!) 141.2 kg    Height:        Intake/Output Summary (Last 24 hours) at 02/04/2021 0948 Last data filed at 02/04/2021 0848 Gross per 24 hour  Intake 1457.32 ml  Output 1860 ml  Net -402.68 ml   Filed Weights   02/02/21 1100 02/03/21 0500 02/04/21 0359  Weight: (!) 141.4 kg (!) 141 kg (!) 141.2 kg    Examination:  General exam: Appears calm and comfortable, obese Respiratory system: Clear to auscultation. Respiratory effort normal.  On nasal cannula oxygen Cardiovascular system: S1 & S2 heard, RRR.  Gastrointestinal system: Abdomen is soft Central nervous system: Alert and awake Extremities: 2+ pitting edema noted bilaterally Skin: No significant lesions noted Psychiatry: Flat affect.    Data Reviewed: I have personally reviewed following labs and imaging studies  CBC: Recent Labs  Lab 01/30/21 0312 01/31/21 0511  WBC 8.4 9.7  NEUTROABS 7.5  --   HGB 15.3 14.0  HCT 51.0 47.7  MCV 94.4 94.8  PLT 164 169   Basic Metabolic Panel: Recent Labs  Lab 01/31/21 0511 02/01/21 0733 02/02/21 0440 02/03/21 0641 02/04/21 0628  NA 144 141 140 138 139  K 3.4* 3.6 4.2 3.9 3.3*  CL 100 98 97* 95* 97*  CO2 35* 31 32 33* 31  GLUCOSE 48* 69* 109* 94 159*  BUN 58* 58* 61* 62* 61*  CREATININE 2.55* 2.48* 2.61* 2.53* 2.49*  CALCIUM 8.7* 8.7* 8.6* 8.5* 8.4*  MG 2.7* 2.5* 2.5* 2.4 2.4   GFR: Estimated Creatinine Clearance: 39 mL/min (A) (by C-G formula based on SCr of 2.49 mg/dL (H)). Liver Function Tests: No results for input(s): AST, ALT, ALKPHOS, BILITOT, PROT, ALBUMIN in the last 168 hours. No results for input(s): LIPASE, AMYLASE in the last 168 hours. No results for input(s): AMMONIA in the last 168 hours. Coagulation Profile: No results for input(s): INR, PROTIME in the last 168 hours. Cardiac Enzymes: No results for input(s): CKTOTAL, CKMB, CKMBINDEX, TROPONINI in the last 168 hours. BNP (last 3 results) No results for input(s): PROBNP in the last 8760  hours. HbA1C: No results for input(s): HGBA1C in the last 72 hours. CBG: Recent Labs  Lab 02/03/21 0734 02/03/21 1111 02/03/21 1609 02/03/21 2014 02/04/21 0717  GLUCAP 87 126* 158* 164* 123*   Lipid Profile: No results for input(s): CHOL, HDL, LDLCALC, TRIG, CHOLHDL, LDLDIRECT in the last 72 hours. Thyroid Function Tests: No results for input(s): TSH, T4TOTAL, FREET4, T3FREE, THYROIDAB in the last 72 hours. Anemia Panel: No results for input(s): VITAMINB12, FOLATE, FERRITIN, TIBC, IRON, RETICCTPCT in the last 72 hours. Sepsis Labs: Recent Labs  Lab 01/30/21 0518  PROCALCITON <0.10    Recent Results (from the past 240 hour(s))  Resp Panel by RT-PCR (Flu A&B, Covid) Nasopharyngeal Swab     Status: None   Collection Time: 01/30/21  2:21 AM  Specimen: Nasopharyngeal Swab; Nasopharyngeal(NP) swabs in vial transport medium  Result Value Ref Range Status   SARS Coronavirus 2 by RT PCR NEGATIVE NEGATIVE Final    Comment: (NOTE) SARS-CoV-2 target nucleic acids are NOT DETECTED.  The SARS-CoV-2 RNA is generally detectable in upper respiratory specimens during the acute phase of infection. The lowest concentration of SARS-CoV-2 viral copies this assay can detect is 138 copies/mL. A negative result does not preclude SARS-Cov-2 infection and should not be used as the sole basis for treatment or other patient management decisions. A negative result may occur with  improper specimen collection/handling, submission of specimen other than nasopharyngeal swab, presence of viral mutation(s) within the areas targeted by this assay, and inadequate number of viral copies(<138 copies/mL). A negative result must be combined with clinical observations, patient history, and epidemiological information. The expected result is Negative.  Fact Sheet for Patients:  EntrepreneurPulse.com.au  Fact Sheet for Healthcare Providers:  IncredibleEmployment.be  This  test is no t yet approved or cleared by the [place] FDA and  has been authorized for detection and/or diagnosis of SARS-CoV-2 by FDA under an Emergency Use Authorization (EUA). This EUA will remain  in effect (meaning this test can be used) for the duration of the COVID-19 declaration under Section 564(b)(1) of the Act, 21 U.S.C.section 360bbb-3(b)(1), unless the authorization is terminated  or revoked sooner.       Influenza A by PCR NEGATIVE NEGATIVE Final   Influenza B by PCR NEGATIVE NEGATIVE Final    Comment: (NOTE) The Xpert Xpress SARS-CoV-2/FLU/RSV plus assay is intended as an aid in the diagnosis of influenza from Nasopharyngeal swab specimens and should not be used as a sole basis for treatment. Nasal washings and aspirates are unacceptable for Xpert Xpress SARS-CoV-2/FLU/RSV testing.  Fact Sheet for Patients: EntrepreneurPulse.com.au  Fact Sheet for Healthcare Providers: IncredibleEmployment.be  This test is not yet approved or cleared by the [place] FDA and has been authorized for detection and/or diagnosis of SARS-CoV-2 by FDA under an Emergency Use Authorization (EUA). This EUA will remain in effect (meaning this test can be used) for the duration of the COVID-19 declaration under Section 564(b)(1) of the Act, 21 U.S.C. section 360bbb-3(b)(1), unless the authorization is terminated or revoked.  Performed at [facility], [address], [place], [place] [address]          Radiology Studies: No results found.      Scheduled Meds:  apixaban  5 mg Oral BID   bisoprolol  5 mg Oral Daily   insulin aspart  0-15 Units Subcutaneous TID WC   insulin aspart  0-5 Units Subcutaneous QHS   ipratropium-albuterol  3 mL Inhalation TID   isosorbide mononitrate  30 mg Oral Daily   levothyroxine  300 mcg Oral QAC breakfast   loratadine  10 mg Oral QPM   metolazone  5 mg Oral Once   mometasone-formoterol  2 puff  Inhalation BID   pantoprazole  40 mg Oral Daily   potassium chloride  10 mEq Oral Daily   potassium chloride  40 mEq Oral Once   predniSONE  40 mg Oral Q breakfast   simvastatin  5 mg Oral q1800   sodium chloride flush  3 mL Intravenous Q12H   Continuous Infusions:  sodium chloride     furosemide (LASIX) 200 mg in dextrose 5% 100 mL (2mg /mL) infusion 8 mg/hr (02/04/21 0022)     LOS: 5 days    Time spent: 35 minutes    Shamir Tuzzolino  Darleen Crocker, DO [facility]  If 7PM-7AM, please contact night-coverage [email or web address] 02/04/2021, 9:48 AM

## 2021-02-04 NOTE — Progress Notes (Signed)
Insurance auth received. [place] ID [id number]. Plan Josem Kaufmann [id number]. Review date is 02/06/21. Per MD, pt not yet stable for dc.

## 2021-02-04 NOTE — Care Management Important Message (Signed)
Important Message  Patient Details  Name: [name] [name] MRN: [id number] Date of Birth: 05-03-1946   Medicare Important Message Given:  Yes     Tommy Medal 02/04/2021, 12:07 PM

## 2021-02-04 NOTE — Progress Notes (Signed)
Progress Note  Patient Name: [name] [name] Date of Encounter: 02/04/2021  Primary Cardiologist: Rozann Lesches, MD  Subjective   Leg swelling with chronic skin changes. No CP or SOB.  Inpatient Medications    Scheduled Meds:  apixaban  5 mg Oral BID   bisoprolol  5 mg Oral Daily   insulin aspart  0-15 Units Subcutaneous TID WC   insulin aspart  0-5 Units Subcutaneous QHS   ipratropium-albuterol  3 mL Inhalation TID   isosorbide mononitrate  30 mg Oral Daily   levothyroxine  300 mcg Oral QAC breakfast   loratadine  10 mg Oral QPM   mometasone-formoterol  2 puff Inhalation BID   pantoprazole  40 mg Oral Daily   potassium chloride  10 mEq Oral Daily   predniSONE  40 mg Oral Q breakfast   simvastatin  5 mg Oral q1800   sodium chloride flush  3 mL Intravenous Q12H   Continuous Infusions:  sodium chloride     furosemide (LASIX) 200 mg in dextrose 5% 100 mL (2mg /mL) infusion 8 mg/hr (02/04/21 0022)   PRN Meds: sodium chloride, acetaminophen **OR** acetaminophen, nitroGLYCERIN, ondansetron **OR** ondansetron (ZOFRAN) IV, senna-docusate, sodium chloride flush   Vital Signs    Vitals:   02/04/21 0355 02/04/21 0359 02/04/21 0736 02/04/21 0744  BP: 136/89     Pulse: 64     Resp: 18     Temp: 98.2 F (36.8 C)     TempSrc:      SpO2: 99%  95% 95%  Weight:  (!) 141.2 kg    Height:        Intake/Output Summary (Last 24 hours) at 02/04/2021 0859 Last data filed at 02/04/2021 0848 Gross per 24 hour  Intake 1817.32 ml  Output 1610 ml  Net 207.32 ml    Filed Weights   02/02/21 1100 02/03/21 0500 02/04/21 0359  Weight: (!) 141.4 kg (!) 141 kg (!) 141.2 kg    Telemetry    Rate controlled atrial fibrillation.  Personally reviewed.  ECG    No ECG reviewed.  Physical Exam   GEN: No acute distress.   Neck: No JVD. Cardiac: Irregularly irregular, 1/6 systolic murmur, no gallop.  Respiratory: Nonlabored.  Decreased breath sounds at the bases. GI: Protuberant bowel  sounds present. MS: Chronic appearing edema and venous stasis; No deformity. Neuro:  Nonfocal. Psych: Alert and oriented x 3. Normal affect.  Labs    Chemistry Recent Labs  Lab 02/02/21 0440 02/03/21 0641 02/04/21 0628  NA 140 138 139  K 4.2 3.9 3.3*  CL 97* 95* 97*  CO2 32 33* 31  GLUCOSE 109* 94 159*  BUN 61* 62* 61*  CREATININE 2.61* 2.53* 2.49*  CALCIUM 8.6* 8.5* 8.4*  GFRNONAA 25* 26* 27*  ANIONGAP 11 10 11       Hematology Recent Labs  Lab 01/30/21 0312 01/31/21 0511  WBC 8.4 9.7  RBC 5.40 5.03  HGB 15.3 14.0  HCT 51.0 47.7  MCV 94.4 94.8  MCH 28.3 27.8  MCHC 30.0 29.4*  RDW 17.5* 17.1*  PLT 164 178     Cardiac Enzymes Recent Labs  Lab 01/14/21 1924 01/19/21 1930 01/19/21 2143 01/30/21 0312 01/30/21 0518  TROPONINIHS 34* 41* 42* 41* 47*     BNP Recent Labs  Lab 01/30/21 0312  BNP 267.0*      Radiology    No results found.  Cardiac Studies   Echocardiogram 12/28/2020:  1. Left ventricular ejection fraction, by estimation, is 55  to 60%. The  left ventricle has normal function. The left ventricle has no regional  wall motion abnormalities. The left ventricular internal cavity size was  moderately dilated. Left ventricular  diastolic function could not be evaluated.   2. Right ventricular systolic function is normal. The right ventricular  size is mildly enlarged. There is moderately elevated pulmonary artery  systolic pressure.   3. Left atrial size was mildly dilated.   4. Right atrial size was moderately dilated.   5. The mitral valve is normal in structure. Trivial mitral valve  regurgitation. No evidence of mitral stenosis.   6. The aortic valve is tricuspid. Aortic valve regurgitation is not  visualized. Mild aortic valve sclerosis is present, with no evidence of  aortic valve stenosis.   7. The inferior vena cava is dilated in size with >50% respiratory  variability, suggesting right atrial pressure of 8 mmHg.   LHC  01/20/2017 1st Mrg lesion, 100 %stenosed. This is the culprit for his recent MI. Ramus lesion, 75 %stenosed. This is a relatively small vessel. Mid LAD lesion, 80 %stenosed. LV end diastolic pressure is moderately elevated. There is no aortic valve stenosis. Completed MI from occluded obtuse marginal.  Patient has been pain free and feels only fatigued.  We elected not to attempt intervention given the time course of his enzymes.   PCI of LAD 01/21/2017 Significant multivessel coronary artery disease (see yesterday's diagnostic catheterization for details). Successful PCI to mid LAD with placement of a Synergy 3.0 x 32 mm drug-eluting stent with 0% residual stenosis and TIMI-3 flow.  Assessment & Plan    1.  Acute on chronic diastolic heart failure, LVEF 55 to 60% with normal RV contraction by echocardiogram in May.  Significantly fluid overloaded, weight has increased from 139 kg in April up to 145 kg at present.  Chest x-ray shows mild interstitial prominence as well as vascular congestion and small bilateral pleural effusions.  2.  CKD stage 3b-4, creatinine 2.36-2.55.  Patient has had nephrology assessment at [facility], would like to establish with local follow-up for sake of convenience.  3.  CAD status post DES to the LAD in 2018, no active angina at this time and high-sensitivity troponin I levels are flat and not suggestive of ACS.  4.  Permanent atrial fibrillation, CHA2DS2-VASc score is 5.  He is on Eliquis for stroke prophylaxis, also Zebeta for heart rate control.  Continue bisoprolol, isosorbide mononitrate, simvastatin and Eliquis. Replenish electrolytes. Maintain serum K>4.0 and Mg >2.0. Unclear if hospital daily weights are accurate. Net -2.38 L on 7/2, -1.48 L on 7/3 and -255 ml on 7/4.  Continue IV lasix infusion today. Give single dose of Metolazone 5 mg today.  If discharge from hospital anticipated soon then at that time transition from IV lasix infusion to PO torsemide  40 mg twice daily. Agree with follow-up with nephrology as an outpatient.   Signed, Meade Maw, MD  02/04/2021, 8:59 AM

## 2021-02-05 DIAGNOSIS — I5031 Acute diastolic (congestive) heart failure: Secondary | ICD-10-CM

## 2021-02-05 DIAGNOSIS — I480 Paroxysmal atrial fibrillation: Secondary | ICD-10-CM

## 2021-02-05 DIAGNOSIS — I2510 Atherosclerotic heart disease of native coronary artery without angina pectoris: Secondary | ICD-10-CM

## 2021-02-05 DIAGNOSIS — I10 Essential (primary) hypertension: Secondary | ICD-10-CM

## 2021-02-05 DIAGNOSIS — N1832 Chronic kidney disease, stage 3b: Secondary | ICD-10-CM

## 2021-02-05 LAB — CBC
HCT: 46.2 % (ref 39.0–52.0)
Hemoglobin: 14.3 g/dL (ref 13.0–17.0)
MCH: 28.5 pg (ref 26.0–34.0)
MCHC: 31 g/dL (ref 30.0–36.0)
MCV: 92 fL (ref 80.0–100.0)
Platelets: 131 10*3/uL — ABNORMAL LOW (ref 150–400)
RBC: 5.02 MIL/uL (ref 4.22–5.81)
RDW: 16.8 % — ABNORMAL HIGH (ref 11.5–15.5)
WBC: 8.9 10*3/uL (ref 4.0–10.5)
nRBC: 0 % (ref 0.0–0.2)

## 2021-02-05 LAB — GLUCOSE, CAPILLARY
Glucose-Capillary: 143 mg/dL — ABNORMAL HIGH (ref 70–99)
Glucose-Capillary: 111 mg/dL — ABNORMAL HIGH (ref 70–99)
Glucose-Capillary: 264 mg/dL — ABNORMAL HIGH (ref 70–99)
Glucose-Capillary: 142 mg/dL — ABNORMAL HIGH (ref 70–99)

## 2021-02-05 LAB — BASIC METABOLIC PANEL
Anion gap: 12 (ref 5–15)
BUN: 63 mg/dL — ABNORMAL HIGH (ref 8–23)
CO2: 35 mmol/L — ABNORMAL HIGH (ref 22–32)
Calcium: 8.9 mg/dL (ref 8.9–10.3)
Chloride: 94 mmol/L — ABNORMAL LOW (ref 98–111)
Creatinine, Ser: 2.63 mg/dL — ABNORMAL HIGH (ref 0.61–1.24)
GFR, Estimated: 25 mL/min — ABNORMAL LOW (ref >60)
Glucose, Bld: 100 mg/dL — ABNORMAL HIGH (ref 70–99)
Potassium: 3.4 mmol/L — ABNORMAL LOW (ref 3.5–5.1)
Sodium: 141 mmol/L (ref 135–145)

## 2021-02-05 LAB — MAGNESIUM: Magnesium: 2.4 mg/dL (ref 1.7–2.4)

## 2021-02-05 MED ORDER — METOLAZONE 5 MG PO TABS
5.0000 mg | ORAL_TABLET | Freq: Once | ORAL | Status: AC
  Administered 2021-02-05: 5 mg via ORAL
  Filled 2021-02-05: qty 1

## 2021-02-05 MED ORDER — POTASSIUM CHLORIDE CRYS ER 20 MEQ PO TBCR
40.0000 meq | EXTENDED_RELEASE_TABLET | Freq: Once | ORAL | Status: AC
  Administered 2021-02-05: 40 meq via ORAL
  Filled 2021-02-05: qty 2

## 2021-02-05 NOTE — Progress Notes (Signed)
PT Cancellation Note  Patient Details Name: [name] [name] MRN: [id number] DOB: [birth year]   Cancelled Treatment:    Reason Eval/Treat Not Completed: Other (comment)  Multiple attempts for PT session.  Pt on phone then eating when tried later.  Will try again later if available.  Ihor Austin, LPTA/CLT; CBIS [phone]  Aldona Lento 02/05/2021, 5:23 PM

## 2021-02-05 NOTE — Progress Notes (Signed)
PROGRESS NOTE    [name] [name]  INO:[id number] DOB: 11/11/1945 DOA: 01/30/2021 PCP: Nickola Major, MD   Brief Narrative:   [name] [name] is a 75 y.o. male a history of CHF with preserved ejection fraction, CAD, atrial fibrillation Eliquis, stage 4 CKD, COPD hypertension, non-Hodgkin's lymphoma, diabetes presenting to the ED with increased shortness of breath over the past several hours. Patient recently admitted to the hospital and discharged 6 days ago for CHF and COPD exacerbation. Patient has had multiple ED presentations and admissions in the last six months for the same issue.  He has been started on IV Lasix infusion per cardiology on 7/1 with dose increased on 7/2 with ongoing diuresis noted.  His baseline weight appears to be near 139 kg.  He is agreeable to SNF on discharge.  Assessment & Plan:   Active Problems:   Chronic kidney disease Stage IV   Type 2 diabetes mellitus with stage 4 chronic kidney disease (HCC)   Hypertension   Acute on chronic respiratory failure with hypoxia and hypercapnia (HCC)   AF (paroxysmal atrial fibrillation) (HCC)   Acute on chronic diastolic CHF - patient presenting with orthopnea, increased O2 demand found to have slightly elevated BNP suggestive of CHF exacerbation. Patient was placed on BiPAP now on 5L [place] with improved breathing. Was 139 kg in April up to 142.2 kg today. UOP (600 ml) still not at target (>1L daily). Agrees to try rehab again. Cardiology following.  - Last echo 5/28 demonstrates EF 55-60% - Started Lasix infusion on 7/1 (changed from IV Lasix BID)-increased to 8mg /hr 7/2 - Continue Eliquis, Zebeta, Imdur, Zocor  - I&O, CMP, Weight daily  - Palliative consult obtained and pt wants full code/measures -Standing weight currently 141 kg with goal weight of 139 kg -Recent urine outputs have been inaccurate -Metolazone 5mg  on 7/5, repeat on 7/6   Acute on chronic respiratory failure - secondary to CHF and COPD. Breathing  improves, O2 sat 93% on 5L [place] (3-4L required baseline). Afebrile, normal WBC, negative procalcitonin ruling against pneumonia. -May have mild COPD exacerbation for which prednisone and scheduled duo nebs will be provided starting 7/2 - Continue 5L [place], but wean further as tolerated - Continue home inhalers - Plan for short-[facility] re[facility] at [facility]   Chronic Atrial fibrillation - regular rate at admission. - Cardiology consulted; appreciate recommendations - CHA2DS2-VASc score is 5 - Continue home eliquis for  stroke prophylaxis - Continue zebeta for heart rate control    Diabetes mellitus type 2 - BG upon admission 127. A1c 5.8%. On Tresiba 56 units qhs at home AM Glucose dropped to 48, improved to 74 with juice. -Blood glucose well controlled off of Lantus   Chronic kidney disease stage IV - Creatine 2.36-2.55. - Follow kidney function with BMP daily - Has nephrology assessment at [facility] per cardiology - Plan to establish nephrology follow up at [place]   Hypothyroidism - last TSH 3/6 1.681 - Continue home levothyroxine      DVT prophylaxis:Eliquis Code Status: Full Family Communication: None at bedside Disposition Plan:  Status is: Inpatient   Remains inpatient appropriate because:IV treatments appropriate due to intensity of illness or inability to take PO   Dispo: The patient is from: Home              Anticipated d/c is to: SNF              Patient currently is not medically stable to d/c.  Difficult to place patient No     Consultants:  Cardiology Palliative   Procedures:  See below   Antimicrobials:  None  Subjective: Patient seen and evaluated today with no new acute complaints or concerns. No acute concerns or events noted overnight.  He has diuresed over 4 L last night, but continues to have lower extremity edema.  Objective: Vitals:   02/04/21 2053 02/05/21 0548 02/05/21 0829 02/05/21 0837  BP: 125/76 (!) 148/95    Pulse: 62 (!) 51     Resp:      Temp: 97.8 F (36.6 C) 97.8 F (36.6 C)    TempSrc: Oral Oral    SpO2: 95% 99% 98% 100%  Weight:      Height:        Intake/Output Summary (Last 24 hours) at 02/05/2021 0849 Last data filed at 02/05/2021 6283 Gross per 24 hour  Intake 1131 ml  Output 5550 ml  Net -4419 ml   Filed Weights   02/02/21 1100 02/03/21 0500 02/04/21 0359  Weight: (!) 141.4 kg (!) 141 kg (!) 141.2 kg    Examination:  General exam: Appears calm and comfortable, morbidly obese Respiratory system: Clear to auscultation. Respiratory effort normal.  On nasal cannula oxygen Cardiovascular system: S1 & S2 heard, RRR.  Gastrointestinal system: Abdomen is soft, obese Central nervous system: Alert and awake Extremities: 2+ pitting edema bilaterally Skin: No significant lesions noted Psychiatry: Flat affect.    Data Reviewed: I have personally reviewed following labs and imaging studies  CBC: Recent Labs  Lab 01/30/21 0312 01/31/21 0511 02/05/21 0525  WBC 8.4 9.7 8.9  NEUTROABS 7.5  --   --   HGB 15.3 14.0 14.3  HCT 51.0 47.7 46.2  MCV 94.4 94.8 92.0  PLT 164 178 151*   Basic Metabolic Panel: Recent Labs  Lab 02/01/21 0733 02/02/21 0440 02/03/21 0641 02/04/21 0628 02/05/21 0525  NA 141 140 138 139 141  K 3.6 4.2 3.9 3.3* 3.4*  CL 98 97* 95* 97* 94*  CO2 31 32 33* 31 35*  GLUCOSE 69* 109* 94 159* 100*  BUN 58* 61* 62* 61* 63*  CREATININE 2.48* 2.61* 2.53* 2.49* 2.63*  CALCIUM 8.7* 8.6* 8.5* 8.4* 8.9  MG 2.5* 2.5* 2.4 2.4 2.4   GFR: Estimated Creatinine Clearance: 36.9 mL/min (A) (by C-G formula based on SCr of 2.63 mg/dL (H)). Liver Function Tests: No results for input(s): AST, ALT, ALKPHOS, BILITOT, PROT, ALBUMIN in the last 168 hours. No results for input(s): LIPASE, AMYLASE in the last 168 hours. No results for input(s): AMMONIA in the last 168 hours. Coagulation Profile: No results for input(s): INR, PROTIME in the last 168 hours. Cardiac Enzymes: No results for  input(s): CKTOTAL, CKMB, CKMBINDEX, TROPONINI in the last 168 hours. BNP (last 3 results) No results for input(s): PROBNP in the last 8760 hours. HbA1C: No results for input(s): HGBA1C in the last 72 hours. CBG: Recent Labs  Lab 02/04/21 0717 02/04/21 1110 02/04/21 1625 02/04/21 2053 02/05/21 0717  GLUCAP 123* 103* 181* 193* 143*   Lipid Profile: No results for input(s): CHOL, HDL, LDLCALC, TRIG, CHOLHDL, LDLDIRECT in the last 72 hours. Thyroid Function Tests: No results for input(s): TSH, T4TOTAL, FREET4, T3FREE, THYROIDAB in the last 72 hours. Anemia Panel: No results for input(s): VITAMINB12, FOLATE, FERRITIN, TIBC, IRON, RETICCTPCT in the last 72 hours. Sepsis Labs: Recent Labs  Lab 01/30/21 0518  PROCALCITON <0.10    Recent Results (from the past 240 hour(s))  Resp Panel by  RT-PCR (Flu A&B, Covid) Nasopharyngeal Swab     Status: None   Collection Time: 01/30/21  2:21 AM   Specimen: Nasopharyngeal Swab; Nasopharyngeal(NP) swabs in vial transport medium  Result Value Ref Range Status   SARS Coronavirus 2 by RT PCR NEGATIVE NEGATIVE Final    Comment: (NOTE) SARS-CoV-2 target nucleic acids are NOT DETECTED.  The SARS-CoV-2 RNA is generally detectable in upper respiratory specimens during the acute phase of infection. The lowest concentration of SARS-CoV-2 viral copies this assay can detect is 138 copies/mL. A negative result does not preclude SARS-Cov-2 infection and should not be used as the sole basis for treatment or other patient management decisions. A negative result may occur with  improper specimen collection/handling, submission of specimen other than nasopharyngeal swab, presence of viral mutation(s) within the areas targeted by this assay, and inadequate number of viral copies(<138 copies/mL). A negative result must be combined with clinical observations, patient history, and epidemiological information. The expected result is Negative.  Fact Sheet for  Patients:  EntrepreneurPulse.com.au  Fact Sheet for Healthcare Providers:  IncredibleEmployment.be  This test is no t yet approved or cleared by the [place] FDA and  has been authorized for detection and/or diagnosis of SARS-CoV-2 by FDA under an Emergency Use Authorization (EUA). This EUA will remain  in effect (meaning this test can be used) for the duration of the COVID-19 declaration under Section 564(b)(1) of the Act, 21 U.S.C.section 360bbb-3(b)(1), unless the authorization is terminated  or revoked sooner.       Influenza A by PCR NEGATIVE NEGATIVE Final   Influenza B by PCR NEGATIVE NEGATIVE Final    Comment: (NOTE) The Xpert Xpress SARS-CoV-2/FLU/RSV plus assay is intended as an aid in the diagnosis of influenza from Nasopharyngeal swab specimens and should not be used as a sole basis for treatment. Nasal washings and aspirates are unacceptable for Xpert Xpress SARS-CoV-2/FLU/RSV testing.  Fact Sheet for Patients: EntrepreneurPulse.com.au  Fact Sheet for Healthcare Providers: IncredibleEmployment.be  This test is not yet approved or cleared by the [place] FDA and has been authorized for detection and/or diagnosis of SARS-CoV-2 by FDA under an Emergency Use Authorization (EUA). This EUA will remain in effect (meaning this test can be used) for the duration of the COVID-19 declaration under Section 564(b)(1) of the Act, 21 U.S.C. section 360bbb-3(b)(1), unless the authorization is terminated or revoked.  Performed at [facility], [address], [place], [place] [address]          Radiology Studies: No results found.      Scheduled Meds:  apixaban  5 mg Oral BID   bisoprolol  5 mg Oral Daily   insulin aspart  0-15 Units Subcutaneous TID WC   insulin aspart  0-5 Units Subcutaneous QHS   ipratropium-albuterol  3 mL Inhalation TID   isosorbide mononitrate  30 mg Oral Daily    levothyroxine  300 mcg Oral QAC breakfast   loratadine  10 mg Oral QPM   metolazone  5 mg Oral Once   mometasone-formoterol  2 puff Inhalation BID   pantoprazole  40 mg Oral Daily   potassium chloride  10 mEq Oral Daily   potassium chloride  40 mEq Oral Once   predniSONE  40 mg Oral Q breakfast   simvastatin  5 mg Oral q1800   sodium chloride flush  3 mL Intravenous Q12H   Continuous Infusions:  sodium chloride     furosemide (LASIX) 200 mg in dextrose 5% 100 mL (2mg /mL) infusion  8 mg/hr (02/04/21 2050)     LOS: 6 days    Time spent: 35 minutes    Taniah Reinecke D Manuella Ghazi, DO [facility]  If 7PM-7AM, please contact night-coverage [email or web address] 02/05/2021, 8:49 AM

## 2021-02-05 NOTE — Progress Notes (Signed)
Progress Note  Patient Name: [name] [name] Date of Encounter: 02/05/2021  Primary Cardiologist: Rozann Lesches, MD  Subjective   Dyspnea improved still volume overloaded   Inpatient Medications    Scheduled Meds:  apixaban  5 mg Oral BID   bisoprolol  5 mg Oral Daily   insulin aspart  0-15 Units Subcutaneous TID WC   insulin aspart  0-5 Units Subcutaneous QHS   ipratropium-albuterol  3 mL Inhalation TID   isosorbide mononitrate  30 mg Oral Daily   levothyroxine  300 mcg Oral QAC breakfast   loratadine  10 mg Oral QPM   mometasone-formoterol  2 puff Inhalation BID   pantoprazole  40 mg Oral Daily   potassium chloride  10 mEq Oral Daily   predniSONE  40 mg Oral Q breakfast   simvastatin  5 mg Oral q1800   sodium chloride flush  3 mL Intravenous Q12H   Continuous Infusions:  sodium chloride     furosemide (LASIX) 200 mg in dextrose 5% 100 mL (2mg /mL) infusion 8 mg/hr (02/04/21 2050)   PRN Meds: sodium chloride, acetaminophen **OR** acetaminophen, nitroGLYCERIN, ondansetron **OR** ondansetron (ZOFRAN) IV, senna-docusate, sodium chloride, sodium chloride flush   Vital Signs    Vitals:   02/04/21 1400 02/04/21 1921 02/04/21 2053 02/05/21 0548  BP: (!) 142/96  125/76 (!) 148/95  Pulse: 72 60 62 (!) 51  Resp: 16 18    Temp: 98.3 F (36.8 C)  97.8 F (36.6 C) 97.8 F (36.6 C)  TempSrc: Oral  Oral Oral  SpO2: 97% 95% 95% 99%  Weight:      Height:        Intake/Output Summary (Last 24 hours) at 02/05/2021 0820 Last data filed at 02/05/2021 2637 Gross per 24 hour  Intake 1594 ml  Output 5950 ml  Net -4356 ml   Filed Weights   02/02/21 1100 02/03/21 0500 02/04/21 0359  Weight: (!) 141.4 kg (!) 141 kg (!) 141.2 kg    Telemetry    02/05/2021 afib good rates   ECG    Afib rate 122 LBBB   Physical Exam   Affect appropriate Overweight white male  HEENT: normal Neck supple with no adenopathy JVP normal no bruits no thyromegaly Lungs clear with no wheezing and  good diaphragmatic motion Heart:  S1/S2 no murmur, no rub, gallop or click PMI normal Abdomen: benighn, BS positve, no tenderness, no AAA no bruit.  No HSM or HJR Plus 3 edema still to thighs  Neuro non-focal Skin warm and dry Post knee replacement    Labs    Chemistry Recent Labs  Lab 02/03/21 0641 02/04/21 0628 02/05/21 0525  NA 138 139 141  K 3.9 3.3* 3.4*  CL 95* 97* 94*  CO2 33* 31 35*  GLUCOSE 94 159* 100*  BUN 62* 61* 63*  CREATININE 2.53* 2.49* 2.63*  CALCIUM 8.5* 8.4* 8.9  GFRNONAA 26* 27* 25*  ANIONGAP 10 11 12      Hematology Recent Labs  Lab 01/30/21 0312 01/31/21 0511 02/05/21 0525  WBC 8.4 9.7 8.9  RBC 5.40 5.03 5.02  HGB 15.3 14.0 14.3  HCT 51.0 47.7 46.2  MCV 94.4 94.8 92.0  MCH 28.3 27.8 28.5  MCHC 30.0 29.4* 31.0  RDW 17.5* 17.1* 16.8*  PLT 164 178 131*    Cardiac Enzymes Recent Labs  Lab 01/14/21 1924 01/19/21 1930 01/19/21 2143 01/30/21 0312 01/30/21 0518  TROPONINIHS 34* 41* 42* 41* 47*    BNP Recent Labs  Lab 01/30/21  7829  BNP 267.0*     Radiology    No results found.  Cardiac Studies   Echocardiogram 12/28/2020:  1. Left ventricular ejection fraction, by estimation, is 55 to 60%. The  left ventricle has normal function. The left ventricle has no regional  wall motion abnormalities. The left ventricular internal cavity size was  moderately dilated. Left ventricular  diastolic function could not be evaluated.   2. Right ventricular systolic function is normal. The right ventricular  size is mildly enlarged. There is moderately elevated pulmonary artery  systolic pressure.   3. Left atrial size was mildly dilated.   4. Right atrial size was moderately dilated.   5. The mitral valve is normal in structure. Trivial mitral valve  regurgitation. No evidence of mitral stenosis.   6. The aortic valve is tricuspid. Aortic valve regurgitation is not  visualized. Mild aortic valve sclerosis is present, with no evidence of   aortic valve stenosis.   7. The inferior vena cava is dilated in size with >50% respiratory  variability, suggesting right atrial pressure of 8 mmHg.   LHC 01/20/2017 1st Mrg lesion, 100 %stenosed. This is the culprit for his recent MI. Ramus lesion, 75 %stenosed. This is a relatively small vessel. Mid LAD lesion, 80 %stenosed. LV end diastolic pressure is moderately elevated. There is no aortic valve stenosis. Completed MI from occluded obtuse marginal.  Patient has been pain free and feels only fatigued.  We elected not to attempt intervention given the time course of his enzymes.   PCI of LAD 01/21/2017 Significant multivessel coronary artery disease (see yesterday's diagnostic catheterization for details). Successful PCI to mid LAD with placement of a Synergy 3.0 x 32 mm drug-eluting stent with 0% residual stenosis and TIMI-3 flow.  Assessment & Plan    1.  Acute on chronic diastolic heart failure, > 4 L diuresis last 25 hours continue iv lasix another 48 hours CXR mild CHF small Effusions and BNP only 267 renal function stable   2.  CKD stage 3b-4, creatinine 2.63 baseline   Patient has had nephrology assessment at [facility], would like to establish with local follow-up for sake of convenience.  3.  CAD status post DES to the LAD in 2018, no active angina at this time and high-sensitivity troponin I levels are flat and not suggestive of ACS.Continue nitrates and beta blockers   4.  Permanent atrial fibrillation, CHA2DS2-VASc score is 5.  He is on Eliquis for stroke prophylaxis, also Zebeta for heart rate control.    Signed, Jenkins Rouge, MD  02/05/2021, 8:20 AM

## 2021-02-06 ENCOUNTER — Other Ambulatory Visit: Payer: Medicare HMO

## 2021-02-06 LAB — RESP PANEL BY RT-PCR (FLU A&B, COVID) ARPGX2
Influenza A by PCR: NEGATIVE
Influenza B by PCR: NEGATIVE
SARS Coronavirus 2 by RT PCR: NEGATIVE

## 2021-02-06 LAB — BASIC METABOLIC PANEL
Anion gap: 12 (ref 5–15)
BUN: 71 mg/dL — ABNORMAL HIGH (ref 8–23)
CO2: 37 mmol/L — ABNORMAL HIGH (ref 22–32)
Calcium: 9.3 mg/dL (ref 8.9–10.3)
Chloride: 88 mmol/L — ABNORMAL LOW (ref 98–111)
Creatinine, Ser: 3.04 mg/dL — ABNORMAL HIGH (ref 0.61–1.24)
GFR, Estimated: 21 mL/min — ABNORMAL LOW (ref >60)
Glucose, Bld: 149 mg/dL — ABNORMAL HIGH (ref 70–99)
Potassium: 4.1 mmol/L (ref 3.5–5.1)
Sodium: 137 mmol/L (ref 135–145)

## 2021-02-06 LAB — GLUCOSE, CAPILLARY
Glucose-Capillary: 168 mg/dL — ABNORMAL HIGH (ref 70–99)
Glucose-Capillary: 163 mg/dL — ABNORMAL HIGH (ref 70–99)
Glucose-Capillary: 210 mg/dL — ABNORMAL HIGH (ref 70–99)
Glucose-Capillary: 143 mg/dL — ABNORMAL HIGH (ref 70–99)

## 2021-02-06 LAB — MAGNESIUM: Magnesium: 2.4 mg/dL (ref 1.7–2.4)

## 2021-02-06 MED ORDER — TORSEMIDE 20 MG PO TABS
40.0000 mg | ORAL_TABLET | Freq: Two times a day (BID) | ORAL | Status: DC
  Administered 2021-02-07: 40 mg via ORAL
  Filled 2021-02-06: qty 2

## 2021-02-06 MED ORDER — TORSEMIDE 40 MG PO TABS: 40.0000 mg | ORAL_TABLET | Freq: Two times a day (BID) | ORAL | 3 refills | Status: DC

## 2021-02-06 MED ORDER — COVID-19 MRNA VACC (MODERNA) 50 MCG/0.25ML IM SUSP
0.2500 mL | Freq: Once | INTRAMUSCULAR | Status: AC
  Administered 2021-02-06: 0.25 mL via INTRAMUSCULAR
  Filled 2021-02-06: qty 0.25

## 2021-02-06 NOTE — Discharge Summary (Signed)
Physician Discharge Summary  SINGLETON HICKOX CHE:[id number] DOB: 1946-05-17 DOA: 01/30/2021  PCP: Nickola Major, MD  Admit date: 01/30/2021  Discharge date: 02/06/2021  Admitted From:Home  Disposition:  SNF  Recommendations for Outpatient Follow-up:  Follow up with PCP in 1-2 weeks Please obtain BMP in one week Continue on torsemide 40 mg twice daily as prescribed to start 7/8 and stop Lasix Stop aspirin use Continue other home medications as prior Follow-up with outpatient palliative care with referral sent Follow-up with nephrology outpatient given CKD stage IV  Home Health: None  Equipment/Devices: Nasal cannula oxygen  Discharge Condition:Stable  CODE STATUS: Full  Diet recommendation: Heart Healthy  Brief/Interim Summary:  [name] [name] is a 75 y.o. male a history of CHF with preserved ejection fraction, CAD, atrial fibrillation Eliquis, stage 4 CKD, COPD hypertension, non-Hodgkin's lymphoma, diabetes presenting to the ED with increased shortness of breath over the past several hours. Patient recently admitted to the hospital and discharged 6 days ago for CHF and COPD exacerbation. Patient has had multiple ED presentations and admissions in the last six months for the same issue.  He has been started on IV Lasix infusion per cardiology on 7/1 with dose increased on 7/2 with ongoing diuresis noted.  He has diuresed a significant amount of fluid over the last several days of admission.  He has required some metolazone 5 mg over the last 2 days to assist with this process.  His weight has declined from 321 pounds on admission to approximately 296 pounds noted today.  He will need follow-up with outpatient palliative care and nephrology with referral sent.  His home Lasix 40 mg has been transitioned to torsemide to better help with diuresis.  Discharge Diagnoses:  Active Problems:   Chronic kidney disease Stage IV   Type 2 diabetes mellitus with stage 4 chronic kidney disease  (HCC)   Hypertension   Acute on chronic respiratory failure with hypoxia and hypercapnia (HCC)   AF (paroxysmal atrial fibrillation) (HCC)  Principal discharge diagnosis: Acute on chronic hypoxemic respiratory failure secondary to acute on chronic diastolic CHF exacerbation along with mild COPD exacerbation.  Discharge Instructions  Discharge Instructions     Amb Referral to Palliative Care   Complete by: As directed    [facility] outpatient palliative care.   Ambulatory referral to Nephrology   Complete by: As directed    [facility] nephrology.   Diet - low sodium heart healthy   Complete by: As directed    Increase activity slowly   Complete by: As directed       Allergies as of 02/06/2021   No Known Allergies      Medication List     STOP taking these medications    aspirin EC 81 MG tablet   furosemide 40 MG tablet Commonly known as: LASIX   mouth rinse Liqd solution   predniSONE 20 MG tablet Commonly known as: DELTASONE       TAKE these medications    acetaminophen 325 MG tablet Commonly known as: TYLENOL Take 2 tablets (650 mg total) by mouth every 6 (six) hours as needed for mild pain (or Fever >/= 101).   albuterol 108 (90 Base) MCG/ACT inhaler Commonly known as: VENTOLIN HFA Inhale 1-2 puffs into the lungs every 6 (six) hours as needed for wheezing or shortness of breath.   bisoprolol 5 MG tablet Commonly known as: ZEBETA Take 5 mg by mouth daily.   chlorhexidine 0.12 % solution Commonly known as: PERIDEX  15 mLs by Mouth Rinse route 2 (two) times daily.   Eliquis 5 MG Tabs tablet Generic drug: apixaban Take 1 tablet (5 mg total) by mouth 2 (two) times daily. This is a dose change   insulin degludec 100 UNIT/ML FlexTouch Pen Commonly known as: TRESIBA Inject 10 Units into the skin daily at 10 pm. What changed: how much to take   ipratropium-albuterol 0.5-2.5 (3) MG/3ML Soln Commonly known as: DUONEB Inhale 3 mLs into the lungs  every 4 (four) hours as needed (shortness of breath).   isosorbide mononitrate 30 MG 24 hr tablet Commonly known as: IMDUR Take 1 tablet (30 mg total) by mouth daily.   levocetirizine 5 MG tablet Commonly known as: XYZAL Take 5 mg by mouth every evening.   levothyroxine 200 MCG tablet Commonly known as: SYNTHROID Take 300 mcg by mouth daily before breakfast. (takes with 123mcg tab for a total of 363mcg)   levothyroxine 100 MCG tablet Commonly known as: SYNTHROID Take 100 mcg by mouth daily before breakfast. (Takes with 200 mcg tab for a total of 300 mcg once daily)   nitroGLYCERIN 0.4 MG SL tablet Commonly known as: NITROSTAT Place 0.4 mg under the tongue every 5 (five) minutes as needed for chest pain.   OXYGEN Inhale 3-4 L into the lungs daily as needed (low oxygen).   potassium chloride 10 MEQ tablet Commonly known as: KLOR-CON Take 1 tablet (10 mEq total) by mouth daily.   senna-docusate 8.6-50 MG tablet Commonly known as: Senokot-S Take 1 tablet by mouth daily as needed for mild constipation.   simvastatin 5 MG tablet Commonly known as: ZOCOR Take 5 mg by mouth daily.   Symbicort 160-4.5 MCG/ACT inhaler Generic drug: budesonide-formoterol Inhale 2 puffs into the lungs daily as needed (shortness of breath).   Torsemide 40 MG Tabs Take 40 mg by mouth 2 (two) times daily. Start taking on: February 07, 2021        Contact information for follow-up providers     Nickola Major, MD. Schedule an appointment as soon as possible for a visit in 2 week(s).   Specialty: Family Medicine Contact information: [address] [place] [address] [phone]              Contact information for after-discharge care     [facility] .   Service: Skilled Nursing Contact information: [address] [place] [phone]                    No Known  Allergies  Consultations: Cardiology   Procedures/Studies: NM Pulmonary Perfusion  Result Date: 01/20/2021 CLINICAL DATA:  PE suspected, low intermediate probability, positive D-dimer, shortness of breath and chest pain for 6 months EXAM: NUCLEAR MEDICINE PERFUSION LUNG SCAN TECHNIQUE: Perfusion images were obtained in multiple projections after intravenous injection of radiopharmaceutical. Ventilation scans intentionally deferred if perfusion scan and chest x-ray adequate for interpretation during COVID 19 epidemic. RADIOPHARMACEUTICALS:  4.0 mCi Tc-65m MAA IV COMPARISON:  Chest radiographs, 01/19/2021 FINDINGS: Homogeneous bilateral pulmonary perfusion without suspicious perfusion defect. Cardiomegaly. IMPRESSION: Very low probability examination for pulmonary embolism by modified perfusion only PIOPED criteria (PE absent). Electronically Signed   By: Eddie Candle M.D.   On: 01/20/2021 11:38   DG Chest Portable 1 View  Result Date: 01/30/2021 CLINICAL DATA:  Dyspnea EXAM: PORTABLE CHEST 1 VIEW COMPARISON:  01/19/2021 FINDINGS: Lung volumes are small, but are stable  since prior examination. Small bilateral pleural effusions are present. Trace bilateral perihilar interstitial pulmonary infiltrate is present, likely cardiogenic in nature. No pneumothorax. Mild cardiomegaly is stable. No acute bone abnormality IMPRESSION: Mild cardiogenic failure with small bilateral pleural effusions. Electronically Signed   By: Fidela Salisbury MD   On: 01/30/2021 02:47   DG Chest Portable 1 View  Result Date: 01/19/2021 CLINICAL DATA:  Shortness of breath EXAM: PORTABLE CHEST 1 VIEW COMPARISON:  01/14/2021 FINDINGS: Stable cardiomegaly. Low lung volumes. Pulmonary vascular congestion with diffuse interstitial prominence. Small bilateral pleural effusions. No pneumothorax. IMPRESSION: Cardiomegaly with pulmonary edema and small bilateral pleural effusions. Overall, findings are similar to the prior exam.  Electronically Signed   By: Davina Poke D.O.   On: 01/19/2021 20:07   DG Chest Portable 1 View  Result Date: 01/14/2021 CLINICAL DATA:  Shortness of breath EXAM: PORTABLE CHEST 1 VIEW COMPARISON:  12/28/2020 FINDINGS: Low lung volumes. Elevation of the right hemidiaphragm. There is improved aeration at the left lung base with patchy opacity remaining. Pulmonary vascular congestion with probable mild edema. No significant pleural effusion. Similar cardiomediastinal contours with mild cardiomegaly. IMPRESSION: Pulmonary vascular congestion with probable mild pulmonary edema. Improved aeration at the left lung base with patchy atelectasis/consolidation remaining. Electronically Signed   By: Macy Mis M.D.   On: 01/14/2021 16:13     Discharge Exam: Vitals:   02/06/21 0819 02/06/21 0821  BP:    Pulse:    Resp:    Temp:    SpO2: 94% 98%   Vitals:   02/05/21 2248 02/06/21 0528 02/06/21 0819 02/06/21 0821  BP:  119/76    Pulse: 74 (!) 54    Resp: 18 18    Temp:  97.8 F (36.6 C)    TempSrc:  Oral    SpO2: 98% 97% 94% 98%  Weight:  134.3 kg    Height:        General: Pt is alert, awake, not in acute distress, obese Cardiovascular: RRR, S1/S2 +, no rubs, no gallops Respiratory: CTA bilaterally, no wheezing, no rhonchi, on nasal cannula oxygen Abdominal: Soft, NT, ND, bowel sounds + Extremities: Scant edema, no cyanosis    The results of significant diagnostics from this hospitalization (including imaging, microbiology, ancillary and laboratory) are listed below for reference.     Microbiology: Recent Results (from the past 240 hour(s))  Resp Panel by RT-PCR (Flu A&B, Covid) Nasopharyngeal Swab     Status: None   Collection Time: 01/30/21  2:21 AM   Specimen: Nasopharyngeal Swab; Nasopharyngeal(NP) swabs in vial transport medium  Result Value Ref Range Status   SARS Coronavirus 2 by RT PCR NEGATIVE NEGATIVE Final    Comment: (NOTE) SARS-CoV-2 target nucleic acids are  NOT DETECTED.  The SARS-CoV-2 RNA is generally detectable in upper respiratory specimens during the acute phase of infection. The lowest concentration of SARS-CoV-2 viral copies this assay can detect is 138 copies/mL. A negative result does not preclude SARS-Cov-2 infection and should not be used as the sole basis for treatment or other patient management decisions. A negative result may occur with  improper specimen collection/handling, submission of specimen other than nasopharyngeal swab, presence of viral mutation(s) within the areas targeted by this assay, and inadequate number of viral copies(<138 copies/mL). A negative result must be combined with clinical observations, patient history, and epidemiological information. The expected result is Negative.  Fact Sheet for Patients:  EntrepreneurPulse.com.au  Fact Sheet for Healthcare Providers:  IncredibleEmployment.be  This test is no  t yet approved or cleared by the [place] and  has been authorized for detection and/or diagnosis of SARS-CoV-2 by FDA under an Emergency Use Authorization (EUA). This EUA will remain  in effect (meaning this test can be used) for the duration of the COVID-19 declaration under Section 564(b)(1) of the Act, 21 U.S.C.section 360bbb-3(b)(1), unless the authorization is terminated  or revoked sooner.       Influenza A by PCR NEGATIVE NEGATIVE Final   Influenza B by PCR NEGATIVE NEGATIVE Final    Comment: (NOTE) The Xpert Xpress SARS-CoV-2/FLU/RSV plus assay is intended as an aid in the diagnosis of influenza from Nasopharyngeal swab specimens and should not be used as a sole basis for treatment. Nasal washings and aspirates are unacceptable for Xpert Xpress SARS-CoV-2/FLU/RSV testing.  Fact Sheet for Patients: EntrepreneurPulse.com.au  Fact Sheet for Healthcare Providers: IncredibleEmployment.be  This test is not  yet approved or cleared by the [place] FDA and has been authorized for detection and/or diagnosis of SARS-CoV-2 by FDA under an Emergency Use Authorization (EUA). This EUA will remain in effect (meaning this test can be used) for the duration of the COVID-19 declaration under Section 564(b)(1) of the Act, 21 U.S.C. section 360bbb-3(b)(1), unless the authorization is terminated or revoked.  Performed at [facility], [address], [place], [place] [address]      Labs: BNP (last 3 results) Recent Labs    01/14/21 1648 01/19/21 1930 01/30/21 0312  BNP 163.0* 163.0* 366.4*   Basic Metabolic Panel: Recent Labs  Lab 02/02/21 0440 02/03/21 0641 02/04/21 0628 02/05/21 0525 02/06/21 0527  NA 140 138 139 141 137  K 4.2 3.9 3.3* 3.4* 4.1  CL 97* 95* 97* 94* 88*  CO2 32 33* 31 35* 37*  GLUCOSE 109* 94 159* 100* 149*  BUN 61* 62* 61* 63* 71*  CREATININE 2.61* 2.53* 2.49* 2.63* 3.04*  CALCIUM 8.6* 8.5* 8.4* 8.9 9.3  MG 2.5* 2.4 2.4 2.4 2.4   Liver Function Tests: No results for input(s): AST, ALT, ALKPHOS, BILITOT, PROT, ALBUMIN in the last 168 hours. No results for input(s): LIPASE, AMYLASE in the last 168 hours. No results for input(s): AMMONIA in the last 168 hours. CBC: Recent Labs  Lab 01/31/21 0511 02/05/21 0525  WBC 9.7 8.9  HGB 14.0 14.3  HCT 47.7 46.2  MCV 94.8 92.0  PLT 178 131*   Cardiac Enzymes: No results for input(s): CKTOTAL, CKMB, CKMBINDEX, TROPONINI in the last 168 hours. BNP: Invalid input(s): POCBNP CBG: Recent Labs  Lab 02/05/21 0717 02/05/21 1113 02/05/21 1607 02/05/21 2150 02/06/21 0715  GLUCAP 143* 111* 264* 142* 168*   D-Dimer No results for input(s): DDIMER in the last 72 hours. Hgb A1c No results for input(s): HGBA1C in the last 72 hours. Lipid Profile No results for input(s): CHOL, HDL, LDLCALC, TRIG, CHOLHDL, LDLDIRECT in the last 72 hours. Thyroid function studies No results for input(s): TSH, T4TOTAL, T3FREE, THYROIDAB  in the last 72 hours.  Invalid input(s): FREET3 Anemia work up No results for input(s): VITAMINB12, FOLATE, FERRITIN, TIBC, IRON, RETICCTPCT in the last 72 hours. Urinalysis    Component Value Date/Time   COLORURINE YELLOW 11/19/2020 1736   APPEARANCEUR CLEAR 11/19/2020 1736   LABSPEC 1.015 11/19/2020 1736   PHURINE 6.0 11/19/2020 1736   GLUCOSEU NEGATIVE 11/19/2020 1736   HGBUR NEGATIVE 11/19/2020 1736   BILIRUBINUR NEGATIVE 11/19/2020 1736   KETONESUR NEGATIVE 11/19/2020 1736   PROTEINUR >=300 (A) 11/19/2020 1736   UROBILINOGEN 4.0 (H) 07/23/2014  [place] 11/19/2020 1736   LEUKOCYTESUR NEGATIVE 11/19/2020 1736   Sepsis Labs Invalid input(s): PROCALCITONIN,  WBC,  LACTICIDVEN Microbiology Recent Results (from the past 240 hour(s))  Resp Panel by RT-PCR (Flu A&B, Covid) Nasopharyngeal Swab     Status: None   Collection Time: 01/30/21  2:21 AM   Specimen: Nasopharyngeal Swab; Nasopharyngeal(NP) swabs in vial transport medium  Result Value Ref Range Status   SARS Coronavirus 2 by RT PCR NEGATIVE NEGATIVE Final    Comment: (NOTE) SARS-CoV-2 target nucleic acids are NOT DETECTED.  The SARS-CoV-2 RNA is generally detectable in upper respiratory specimens during the acute phase of infection. The lowest concentration of SARS-CoV-2 viral copies this assay can detect is 138 copies/mL. A negative result does not preclude SARS-Cov-2 infection and should not be used as the sole basis for treatment or other patient management decisions. A negative result may occur with  improper specimen collection/handling, submission of specimen other than nasopharyngeal swab, presence of viral mutation(s) within the areas targeted by this assay, and inadequate number of viral copies(<138 copies/mL). A negative result must be combined with clinical observations, patient history, and epidemiological information. The expected result is Negative.  Fact Sheet for Patients:   EntrepreneurPulse.com.au  Fact Sheet for Healthcare Providers:  IncredibleEmployment.be  This test is no t yet approved or cleared by the [place] FDA and  has been authorized for detection and/or diagnosis of SARS-CoV-2 by FDA under an Emergency Use Authorization (EUA). This EUA will remain  in effect (meaning this test can be used) for the duration of the COVID-19 declaration under Section 564(b)(1) of the Act, 21 U.S.C.section 360bbb-3(b)(1), unless the authorization is terminated  or revoked sooner.       Influenza A by PCR NEGATIVE NEGATIVE Final   Influenza B by PCR NEGATIVE NEGATIVE Final    Comment: (NOTE) The Xpert Xpress SARS-CoV-2/FLU/RSV plus assay is intended as an aid in the diagnosis of influenza from Nasopharyngeal swab specimens and should not be used as a sole basis for treatment. Nasal washings and aspirates are unacceptable for Xpert Xpress SARS-CoV-2/FLU/RSV testing.  Fact Sheet for Patients: EntrepreneurPulse.com.au  Fact Sheet for Healthcare Providers: IncredibleEmployment.be  This test is not yet approved or cleared by the [place] FDA and has been authorized for detection and/or diagnosis of SARS-CoV-2 by FDA under an Emergency Use Authorization (EUA). This EUA will remain in effect (meaning this test can be used) for the duration of the COVID-19 declaration under Section 564(b)(1) of the Act, 21 U.S.C. section 360bbb-3(b)(1), unless the authorization is terminated or revoked.  Performed at [facility], [address], [place], [place] [address]      Time coordinating discharge: 35 minutes  SIGNED:   Rodena Goldmann, DO [facility] 02/06/2021, 10:15 AM  If 7PM-7AM, please contact night-coverage [email or web address]

## 2021-02-06 NOTE — TOC Transition Note (Signed)
Transition of Care [facility]) - CM/SW Discharge Note   Patient Details  Name: [name] [name] MRN: [id number] Date of Birth: 1946/07/04  Transition of Care [facility]) CM/SW Contact:  Ihor Gully, LCSW Phone Number: 02/06/2021, 1:56 PM   Clinical Narrative:    D/C clinicals sent to facility. Patient to receive COVID booster prior to d/c. Referred to [place]. Discussed with Ebony Hail at [facility] regarding referring patient to [facility] upon facility discharge.    Final next level of care: (P) Skilled Nursing Facility Barriers to Discharge: (P) No Barriers Identified   Patient Goals and CMS Choice Patient states their goals for this hospitalization and ongoing recovery are:: rehab CMS Medicare.gov Compare Post Acute Care list provided to:: Patient Choice offered to / list presented to : Patient  Discharge Placement              Patient chooses bed at: [facility] Patient to be transferred to facility by: [place] Name of family member notified: sister Patient and family notified of of transfer: 02/06/21  Discharge Plan and Services In-house Referral: Clinical Social Work   Post Acute Care Choice: [place]                               Social Determinants of Health (SDOH) Interventions     Readmission Risk Interventions Readmission Risk Prevention Plan 01/31/2021 12/29/2020 10/07/2020  Transportation Screening Complete Complete Complete  Medication Review Press photographer) - Complete Complete  PCP or Specialist appointment within 3-5 days of discharge - Complete -  [place] or Home Care Consult Complete Complete Complete  SW Recovery Care/Counseling Consult Complete Complete Complete  Palliative Care Screening Complete Not Applicable Not [place] Complete Complete Not Applicable  Some recent data might be hidden

## 2021-02-06 NOTE — Progress Notes (Addendum)
Progress Note  Patient Name: [name] [name] Date of Encounter: 02/06/2021  [facility] Cardiologist: Rozann Lesches, MD   Subjective   Breathing improved. He has been using a CPAP at night and feels like this has helped with his symptoms. No chest pain or palpitations. Lower extremity edema has improved as well.   Inpatient Medications    Scheduled Meds:  apixaban  5 mg Oral BID   bisoprolol  5 mg Oral Daily   insulin aspart  0-15 Units Subcutaneous TID WC   insulin aspart  0-5 Units Subcutaneous QHS   ipratropium-albuterol  3 mL Inhalation TID   isosorbide mononitrate  30 mg Oral Daily   levothyroxine  300 mcg Oral QAC breakfast   loratadine  10 mg Oral QPM   mometasone-formoterol  2 puff Inhalation BID   pantoprazole  40 mg Oral Daily   potassium chloride  10 mEq Oral Daily   predniSONE  40 mg Oral Q breakfast   simvastatin  5 mg Oral q1800   sodium chloride flush  3 mL Intravenous Q12H   Continuous Infusions:  sodium chloride     furosemide (LASIX) 200 mg in dextrose 5% 100 mL (2mg /mL) infusion 8 mg/hr (02/06/21 0810)   PRN Meds: sodium chloride, acetaminophen **OR** acetaminophen, nitroGLYCERIN, ondansetron **OR** ondansetron (ZOFRAN) IV, senna-docusate, sodium chloride, sodium chloride flush   Vital Signs    Vitals:   02/05/21 2248 02/06/21 0528 02/06/21 0819 02/06/21 0821  BP:  119/76    Pulse: 74 (!) 54    Resp: 18 18    Temp:  97.8 F (36.6 C)    TempSrc:  Oral    SpO2: 98% 97% 94% 98%  Weight:  134.3 kg    Height:        Intake/Output Summary (Last 24 hours) at 02/06/2021 0829 Last data filed at 02/06/2021 0810 Gross per 24 hour  Intake 853.37 ml  Output 4675 ml  Net -3821.63 ml   Last 3 Weights 02/06/2021 02/04/2021 02/03/2021  Weight (lbs) 296 lb 1.2 oz 311 lb 3.2 oz 310 lb 12.8 oz  Weight (kg) 134.3 kg 141.159 kg 140.978 kg      Telemetry    Atrial fibrillation, HR in 50's to 60's. Occasional pauses with the longest being 2.15 seconds.  Occasional PVC's and one episode of 3 beats NSVT.  - Personally Reviewed  ECG    No new tracings.   Physical Exam   GEN: Elderly male appearing in no acute distress.   Neck: No JVD Cardiac: Irregularly irregular, no murmurs, rubs, or gallops.  Respiratory: Clear to auscultation bilaterally, no wheezing or rales. GI: Soft, nontender, non-distended  MS: 1+ pitting edema bilaterally; No deformity. Neuro:  Nonfocal  Psych: Normal affect   Labs    High Sensitivity Troponin:   Recent Labs  Lab 01/14/21 1924 01/19/21 1930 01/19/21 2143 01/30/21 0312 01/30/21 0518  TROPONINIHS 34* 41* 42* 41* 47*      Chemistry Recent Labs  Lab 02/04/21 0628 02/05/21 0525 02/06/21 0527  NA 139 141 137  K 3.3* 3.4* 4.1  CL 97* 94* 88*  CO2 31 35* 37*  GLUCOSE 159* 100* 149*  BUN 61* 63* 71*  CREATININE 2.49* 2.63* 3.04*  CALCIUM 8.4* 8.9 9.3  GFRNONAA 27* 25* 21*  ANIONGAP 11 12 12      Hematology Recent Labs  Lab 01/31/21 0511 02/05/21 0525  WBC 9.7 8.9  RBC 5.03 5.02  HGB 14.0 14.3  HCT 47.7 46.2  MCV 94.8  92.0  MCH 27.8 28.5  MCHC 29.4* 31.0  RDW 17.1* 16.8*  PLT 178 131*    BNPNo results for input(s): BNP, PROBNP in the last 168 hours.   DDimer No results for input(s): DDIMER in the last 168 hours.   Radiology    No results found.  Cardiac Studies   Echocardiogram: 12/28/2020 IMPRESSIONS     1. Left ventricular ejection fraction, by estimation, is 55 to 60%. The  left ventricle has normal function. The left ventricle has no regional  wall motion abnormalities. The left ventricular internal cavity size was  moderately dilated. Left ventricular  diastolic function could not be evaluated.   2. Right ventricular systolic function is normal. The right ventricular  size is mildly enlarged. There is moderately elevated pulmonary artery  systolic pressure.   3. Left atrial size was mildly dilated.   4. Right atrial size was moderately dilated.   5. The mitral  valve is normal in structure. Trivial mitral valve  regurgitation. No evidence of mitral stenosis.   6. The aortic valve is tricuspid. Aortic valve regurgitation is not  visualized. Mild aortic valve sclerosis is present, with no evidence of  aortic valve stenosis.   7. The inferior vena cava is dilated in size with >50% respiratory  variability, suggesting right atrial pressure of 8 mmHg.   Patient Profile     75 y.o. male w/ PMH of HFpEF, CAD (s/p DES to LAD in 2018), permanent atrial fibrillation, non-Hodgkin's lymphoma, chronic hypoxic respiratory failure (on 3L [place] at baseline), HTN, HLD, Type 2 DM, OSA and Stage 4 CKD who is currently admitted for an acute CHF exacerbation.   Assessment & Plan   1. Acute HFpEF - He has been receiving IV Lasix and received Metolazone 5mg  for the past 2 days with an excellent net output yesterday of -3.3 L (net negative -13.8 L this admission recorded but suspect a higher output). Weight has declined from 321 lbs on admission to a recorded weight of 296 lbs today (unsure of accuracy as he was at 311 lbs on 7/5). Will ask for a standing weight to be obtained for improved accuracy.   - Discussed with the admitting team and they are planning for discharge to SNF today. Given his bump in creatinine from 2.63 to 3.04, agree with stopping IV Lasix and switching to PO diuretics. Will write to start Torsemide 40mg  BID tomorrow as previously recommended. Would also recommend referral for a sleep study as an outpatient as he was not on CPAP prior to admission.   2. CAD - He is s/p DES to LAD in 2018. He denies any recent anginal symptoms. Hs Troponin values were flat at 41 and 47 this admission which is most consistent with demand ischemia in the setting of his CHF exacerbation.  - Continue Bisoprolol 5mg  daily, Imdur 30mg  daily and Simvastatin 5mg  daily. Not on ASA given the need for anticoagulation.   3. Permanent Atrial Fibrillation - His HR has overall been  well-controlled in the 50's to 60's with occasional nocturnal pauses up to 2.15 seconds. Remains on Bisoprolol 5mg  daily for rate-control.  - On Eliquis 5mg  BID for anticoagulation which is the appropriate dose at this time based off his age, weight and renal function.   4. HTN - His BP has been well-controlled at 119/76 - 125/79 within the past 24 hours. Continue current medication regimen.   5. Stage 4 CKD - Baseline creatinine 2.3 - 2.5. Elevated to 3.04 today after  having received intermittent doses of Metolazone. Will hold additional doses for now. Will stop IV Lasix and switch to PO Torsemide 40mg  BID. Needs to establish with a local Nephrologist.    For questions or updates, please contact [place] Please consult [email or web address] for contact info under        Signed, Erma Heritage, PA-C  02/06/2021, 8:29 AM    Attending Note:  Patient seen and examined. Agree with note by Erma Heritage, PA-C. Mr Banwart is a 75 yo male with HFpEF, CAD (s/p DES to LAD in 2018), permanent atrial fibrillation, non-Hodgkin's lymphoma, chronic hypoxic respiratory failure (on 3L [place] at baseline), HTN, HLD, Type 2 DM, OSA and Stage 4 CKD who was admitted for acute CHF exacerbation. Successfully diuresed with IV lasix and daily dosing (x 2) of Metolazone. Has had adequate urine output for the past 48 hours. As expected, he had a bump in his serum creatinine (from 2.6 to 3.0). Would recommend stopping metolazone. Switch from IV lasix to PO torsemide. Continue bisoprolol, Imdur and Simvastatin. Continue Eliquis. Primary team is planning discharge to SNF today.  Melina Schools, MD, [facility]

## 2021-02-07 LAB — GLUCOSE, CAPILLARY: Glucose-Capillary: 210 mg/dL — ABNORMAL HIGH (ref 70–99)

## 2021-02-07 LAB — CBC
HCT: 47.4 % (ref 39.0–52.0)
Hemoglobin: 14.8 g/dL (ref 13.0–17.0)
MCH: 28.1 pg (ref 26.0–34.0)
MCHC: 31.2 g/dL (ref 30.0–36.0)
MCV: 90.1 fL (ref 80.0–100.0)
Platelets: 128 10*3/uL — ABNORMAL LOW (ref 150–400)
RBC: 5.26 MIL/uL (ref 4.22–5.81)
RDW: 16.3 % — ABNORMAL HIGH (ref 11.5–15.5)
WBC: 11.1 10*3/uL — ABNORMAL HIGH (ref 4.0–10.5)
nRBC: 0 % (ref 0.0–0.2)

## 2021-02-07 NOTE — Progress Notes (Signed)
    Discharge to SNF was delayed yesterday. No new recommendations from a cardiac perspective. Please refer to rounding note from 7/7. He has been transitioned to PO diuretic therapy with Torsemide 40mg  BID. Will need a repeat BMET in 1 week at SNF. Outpatient Cardiology follow-up has been arranged.   Signed, Erma Heritage, PA-C 02/07/2021, 9:16 AM Pager: [phone]

## 2021-02-10 ENCOUNTER — Ambulatory Visit: Payer: Medicare HMO | Admitting: Family Medicine

## 2021-02-17 ENCOUNTER — Inpatient Hospital Stay: Payer: Medicare HMO | Admitting: Internal Medicine

## 2021-02-20 ENCOUNTER — Telehealth: Payer: Self-pay | Admitting: Family Medicine

## 2021-02-20 NOTE — Telephone Encounter (Signed)
NEW MESSAGE     PATIENT WANTS TO HAVE TEETH EXTRACTED, HE IS ON ELIQUIS AND BABY ASPIRIN, HE WANTS TO KNOW WHEN TO STOP IT BEFORE HE HAS THIS EXTRACTION DONE.   TOLD PATIENT WE NEED THE DENTIST INFORMATION BEFORE WE COULD TELL HIM, HE SAID HE WILL HAVE THE DENTIST CALL WHEN HE PICKS ONE

## 2021-02-20 NOTE — Telephone Encounter (Signed)
Noted>   Pt is preplanning his dental procedures.  Has not picked a dentist yet.  He will have dentist send surgical request when he gets established.

## 2021-03-06 ENCOUNTER — Other Ambulatory Visit: Payer: Medicare HMO

## 2021-03-06 NOTE — Progress Notes (Signed)
Cardiology Office Note  Date: 07/31/2020   ID: [name], [name] 1946/07/18, MRN [id number]  PCP:  [name]  Cardiologist:  Rozann Lesches, MD Electrophysiologist:  None   Chief Complaint: Hospital follow-up   History of Present Illness: [name] [name] is a 75 y.o. male with a history of CAD, HLD, bilateral leg pain, CKD stage IV, hyperlipidemia, NSTEMI, OSA, DM type II, non-Hodgkin's lymphoma, hypothyroidism, morbid obesity, acute on chronic respiratory failure with hypoxia and hypercapnia, atrial fibrillation  Had a recent hospital admission on 01/30/2021.  He had previously been admitted and discharged 6 days prior to presentation for CHF and COPD exacerbation on 01/19/2021.  He had multiple ED presentations and admissions in the prior 6 months for the same issue.  He had he had been started on IV Lasix infusion by cardiology on July 1 with ongoing diuresis.  He diuresed a significant amount of fluid over the several days after admission.  He required some metolazone 5 mg to assist with the process.  His weight had declined from 321 pounds on admission to approximately 296 on day of discharge.  Plan was to follow-up with outpatient palliative care and nephrology referral was sent.  His home Lasix was transitioned to torsemide.  He had a BMP on [facility]/25/2021 at [facility] demonstrating sodium of 143, potassium 3.6, chloride 100, CO2 31, BUN 55, glucose 30, creatinine 2.38, GFR 28.  He is here for hospital follow-up.  He states he has been doing reasonably well under the circumstances.  He states he lost a lot of weight at recent hospital stay.  His initial weight on presentation was 321 pounds at discharge his weight was 296.  His weight today is 300.  He is continuing on his regimen of torsemide 40 mg p.o. twice daily.  His recent follow-up basic metabolic panel on 8/52/7782 GFR of 28 and creatinine of 2.38.  He has an upcoming follow-up with nephrology.   He also states he has a pending sleep study for suspected sleep apnea.  He states during the hospital stay he was on either BiPAP or CPAP and stated that was the best sleep he had experienced in quite some time.  He has a follow-up with pulmonology Dr. [name] on August 11 at 2 PM.  Heart rate today is 76.  Blood pressure 110/62.  Weight today is 300 so he is 4 pounds up status post recent discharge.  He is currently sitting in wheelchair.  He does state when he gets up and moves around he becomes fairly short of breath.  He is currently on continuous O2 at 3 to 4 L.    Past Medical History:  Diagnosis Date   CAD (coronary artery disease)    DES to LAD June 2018   CKD (chronic kidney disease) stage 3, GFR 30-59 ml/min (HCC)    COVID    Essential hypertension    History of pneumonia    Hyperlipidemia    Hypothyroidism    LBBB (left bundle branch block)    Morbid obesity ([place])    Non Hodgkin's lymphoma ([place])    Status post XRT and chemotherapy   NSTEMI (non-ST elevated myocardial infarction) [facility])    June 2018   Peripheral neuropathy    Sleep apnea    Type 2 diabetes mellitus ([place])     Past Surgical History:  Procedure Laterality Date   CHOLECYSTECTOMY  1992   COLONOSCOPY N/A 09/03/2014   SLF:six colon polyps  removed/small internal hemorrhoids   CORONARY STENT INTERVENTION N/A 01/21/2017   Procedure: Coronary Stent Intervention;  Surgeon: Nelva Bush, MD;  Location: [place] [facility];  Service: Cardiovascular;  Laterality: N/A;   ESOPHAGOGASTRODUODENOSCOPY N/A 09/03/2014   SLF: mild gastritis/few gastric polyps   LEFT HEART CATH AND CORONARY ANGIOGRAPHY N/A 01/20/2017   Procedure: Left Heart Cath and Coronary Angiography;  Surgeon: Jettie Booze, MD;  Location: [place] CV [facility];  Service: Cardiovascular;  Laterality: N/A;   TOTAL KNEE ARTHROPLASTY  11/10/2011   Procedure: TOTAL KNEE ARTHROPLASTY;  Surgeon: Mauri Pole, MD;  Location: [facility];  Service: Orthopedics;   Laterality: Right;   TOTAL KNEE ARTHROPLASTY Left 05/24/2018   Procedure: LEFT TOTAL KNEE ARTHROPLASTY;  Surgeon: Melrose Nakayama, MD;  Location: [place];  Service: Orthopedics;  Laterality: Left;    Current Outpatient Medications  Medication Sig Dispense Refill   acetaminophen (TYLENOL) 325 MG tablet Take 2 tablets (650 mg total) by mouth every 6 (six) hours as needed for mild pain (or Fever >/= 101). 30 tablet 1   albuterol (VENTOLIN HFA) 108 (90 Base) MCG/ACT inhaler Inhale 1-2 puffs into the lungs every 6 (six) hours as needed for wheezing or shortness of breath. 1 Inhaler 0   amLODipine (NORVASC) 10 MG tablet Take 10 mg by mouth daily.     apixaban (ELIQUIS) 2.5 MG TABS tablet Take 1 tablet (2.5 mg total) by mouth 2 (two) times daily. 60 tablet 6   bisoprolol (ZEBETA) 5 MG tablet Take 2.5 mg by mouth daily.     furosemide (LASIX) 40 MG tablet Take 40 mg by mouth daily.     insulin aspart (NOVOLOG) 100 UNIT/ML FlexPen See admin instructions.     insulin degludec (TRESIBA FLEXTOUCH) 100 UNIT/ML SOPN FlexTouch Pen Inject 0.5 mLs (50 Units total) into the skin daily at 10 pm. (Patient taking differently: Inject 60 Units into the skin daily at 10 pm.)     ipratropium-albuterol (DUONEB) 0.5-2.5 (3) MG/3ML SOLN Inhale 3 mLs into the lungs every 4 (four) hours as needed.      isosorbide mononitrate (IMDUR) 30 MG 24 hr tablet Take 1 tablet (30 mg total) by mouth daily. 30 tablet 1   levocetirizine (XYZAL) 5 MG tablet Take 5 mg by mouth every evening.      levothyroxine (SYNTHROID) 100 MCG tablet Take 100 mcg by mouth daily. (takes with 217mcg for a total of 377mcg)     levothyroxine (SYNTHROID, LEVOTHROID) 200 MCG tablet Take 300 mcg by mouth daily before breakfast. (takes with 162mcg for a total of 384mcg)     nitroGLYCERIN (NITROSTAT) 0.4 MG SL tablet Place 0.4 mg under the tongue every 5 (five) minutes as needed for chest pain.   11   OXYGEN Inhale 2 L into the lungs daily as needed.       senna-docusate (SENOKOT-S) 8.6-50 MG tablet Take 1 tablet by mouth daily as needed.      simvastatin (ZOCOR) 5 MG tablet Take 5 mg by mouth daily.     SYMBICORT 160-4.5 MCG/ACT inhaler Inhale 2 puffs into the lungs daily.     fluticasone (FLONASE) 50 MCG/ACT nasal spray Place 2 sprays into both nostrils daily for 14 days. (Patient taking differently: Place 2 sprays into both nostrils as needed. ) 1 g 0   hydrALAZINE (APRESOLINE) 25 MG tablet Take 1 tablet (25 mg total) by mouth 2 (two) times daily. (Patient taking differently: Take 25 mg by mouth daily. ) 180 tablet 1  No current facility-administered medications for this visit.   Allergies:  Patient has no known allergies.   Social History: The patient  reports that he quit smoking about 13 years ago. His smoking use included cigarettes. He has a 7.50 pack-year smoking history. He has never used smokeless tobacco. He reports that he does not drink alcohol and does not use drugs.   Family History: The patient's family history includes Cancer in his father, maternal uncle, mother, and paternal uncle.   ROS:  Please see the history of present illness. Otherwise, complete review of systems is positive for none.  All other systems are reviewed and negative.   Physical Exam: VS:  BP 118/64   Pulse 73   Resp 16   SpO2 93% , BMI There is no height or weight on file to calculate BMI.  Wt Readings from Last 3 Encounters:  07/20/20 (!) 308 lb 1.6 oz (139.8 kg)  06/24/20 296 lb 8.3 oz (134.5 kg)  04/04/20 (!) 303 lb (137.4 kg)    General: Severely morbidly obese patient appears comfortable at rest. Neck: Supple, no elevated JVP or carotid bruits, no thyromegaly. Lungs: Clear to auscultation, nonlabored breathing at rest. Cardiac: Irregularly irregular rate and rhythm, no S3 or significant systolic murmur, no pericardial rub. Extremities: No pitting edema, distal pulses 2+. Skin: Warm and dry. Musculoskeletal: No kyphosis. Neuropsychiatric:  Alert and oriented x3, affect grossly appropriate.  ECG:  EKG 10/06/2020 atrial fibrillation with a rate of 78, nonspecific IVCD with LAD, anterolateral infarct old.  Recent Labwork: 06/27/2020: TSH 2.213 07/19/2020: B Natriuretic Peptide 182.0 07/20/2020: ALT 12; AST 15; BUN 34; Creatinine, Ser 2.50; Hemoglobin 15.8; Magnesium 2.3; Platelets 111; Potassium 3.3; Sodium 138     Component Value Date/Time   CHOL 95 06/25/2020 0607   TRIG 126 06/25/2020 0607   HDL 21 (L) 06/25/2020 0607   CHOLHDL 4.5 06/25/2020 0607   VLDL 25 06/25/2020 0607   LDLCALC 49 06/25/2020 0607    Other Studies Reviewed Today:   Echocardiogram 12/28/2020  1. Left ventricular ejection fraction, by estimation, is 55 to 60%. The left ventricle has normal function. The left ventricle has no regional wall motion abnormalities. The left ventricular internal cavity size was moderately dilated. Left ventricular diastolic function could not be evaluated. 2. Right ventricular systolic function is normal. The right ventricular size is mildly enlarged. There is moderately elevated pulmonary artery systolic pressure. 3. Left atrial size was mildly dilated. 4. Right atrial size was moderately dilated. 5. The mitral valve is normal in structure. Trivial mitral valve regurgitation. No evidence of mitral stenosis. 6. The aortic valve is tricuspid. Aortic valve regurgitation is not visualized. Mild aortic valve sclerosis is present, with no evidence of aortic valve stenosis. 7. The inferior vena cava is dilated in size with >50% respiratory variability, suggesting right atrial pressure of 8 mmHg.     Chest x-ray 07/19/2020 IMPRESSION: Multifocal pneumonia with bilateral trace pleural effusions. Followup PA and lateral chest X-ray is recommended in 3-4 weeks following therapy to ensure resolution and exclude underlying malignancy.   Echocardiogram 07/20/2020 IMPRESSIONS  1. Left ventricular ejection fraction, by  estimation, is 50 to 55%. The  left ventricle has low normal function. Left ventricular endocardial  border not optimally defined to evaluate regional wall motion. Left  ventricular diastolic parameters are  consistent with Grade II diastolic dysfunction (pseudonormalization).  Elevated left atrial pressure.   2. Right ventricular systolic function is normal. The right ventricular  size is normal.  3. Left atrial size was mildly dilated.   4. The mitral valve is normal in structure. Trivial mitral valve  regurgitation. No evidence of mitral stenosis.   5. The aortic valve is tricuspid. There is mild calcification of the  aortic valve. There is mild thickening of the aortic valve. Aortic valve  regurgitation is not visualized. No aortic stenosis is present.   6. The inferior vena cava is normal in size with greater than 50%  respiratory variability, suggesting right atrial pressure of 3 mmHg.    Lower extremity arterial duplex / ABI 04/17/2020 Right: Resting right ankle-brachial index is within normal range. No evidence of significant right lower extremity arterial disease. The right toe-brachial index is normal. Left: Resting left ankle-brachial index is within normal range. No evidence of significant left lower extremity arterial disease. The left toe-brachial index is normal.    Echocardiogram 11/17/2019:  1. Left ventricular ejection fraction, by estimation, is 45 to 50%. The  left ventricle has mildly decreased function. The left ventricle  demonstrates global hypokinesis. The left ventricular internal cavity size  was severely dilated. There is moderate  left ventricular hypertrophy of the septal segment. Left ventricular  diastolic parameters are consistent with Grade II diastolic dysfunction  (pseudonormalization). Elevated left ventricular end-diastolic pressure.   2. Right ventricular systolic function is mildly reduced. The right  ventricular size is mildly enlarged. There  is normal pulmonary artery  systolic pressure.   3. Left atrial size was mildly dilated.   4. Right atrial size was mildly dilated.   5. The mitral valve is grossly normal. No evidence of mitral valve  regurgitation.   6. The aortic valve was not well visualized. Aortic valve regurgitation  is trivial.   7. The inferior vena cava is normal in size with greater than 50%  respiratory variability, suggesting right atrial pressure of 3 mmHg  Assessment and Plan:   1. Atrial fibrillation, new onset ([place]) Heart rate today is 76 and irregularly irregular.  No recent bleeding on Eliquis.  Continue Eliquis 5 mg p.o. twice daily.  Continue bisoprolol 5 mg p.o. daily.  CHADS2-Vasc 4  2. Coronary artery disease involving native coronary artery of native heart without angina pectoris Denies any anginal symptoms today.  Continue Imdur 30 mg daily.  Aspirin was stopped and Eliquis started at last visit for systemic anticoagulation for new onset atrial fibrillation.  Continue nitroglycerin sublingual as needed.  Continue simvastatin 5 mg daily.  3. Essential hypertension BP today 110/62.  Continue bisoprolol 5 mg p.o. daily     4. Stage 3b chronic kidney disease [facility]) Patient has a follow-up with nephrology soon which was ordered at discharge from recent hospital stay.  5.  Acute on chronic congestive heart failure. Denies any current SOB at rest.  He is not very active.  He is on continuous O2 at 3 to 4 L as needed.  States he is okay as long as he is on his O2.  Continue bisoprolol 5 mg p.o. daily.  Continue Imdur 30 mg daily.  Continue torsemide 40 mg p.o. twice daily.  6.  COPD./Shortness of breath. States he is okay when wearing O2 but becomes short of breath with modest activity.  States he has a history of COPD.  He has seen Dr. [name] in the past but had not followed up.  He has a follow-up with Dr. [name] on August 11 status post hospital discharge and states he also has a pending sleep  study  Medication  Adjustments/Labs and Tests Ordered: Current medicines are reviewed at length with the patient today.  Concerns regarding medicines are outlined above.   Disposition: Follow-up with Dr. Domenic Polite at scheduled appointment in August  Signed, Lasonya Hubner Quinn, NP  07/31/2020 12:53 PM    [facility] [place], [place], [place] [address] Phone: [phone]; Fax: [phone]

## 2021-03-07 ENCOUNTER — Encounter: Payer: Self-pay | Admitting: Family Medicine

## 2021-03-07 ENCOUNTER — Other Ambulatory Visit: Payer: Self-pay

## 2021-03-07 ENCOUNTER — Telehealth: Payer: Self-pay | Admitting: Cardiology

## 2021-03-07 ENCOUNTER — Ambulatory Visit: Payer: Medicare HMO | Admitting: Family Medicine

## 2021-03-07 VITALS — BP 110/62 | HR 76 | Ht 72.0 in | Wt 300.0 lb

## 2021-03-07 DIAGNOSIS — N184 Chronic kidney disease, stage 4 (severe): Secondary | ICD-10-CM | POA: Diagnosis not present

## 2021-03-07 DIAGNOSIS — J9621 Acute and chronic respiratory failure with hypoxia: Secondary | ICD-10-CM | POA: Diagnosis not present

## 2021-03-07 DIAGNOSIS — I4891 Unspecified atrial fibrillation: Secondary | ICD-10-CM

## 2021-03-07 DIAGNOSIS — I10 Essential (primary) hypertension: Secondary | ICD-10-CM

## 2021-03-07 DIAGNOSIS — J9622 Acute and chronic respiratory failure with hypercapnia: Secondary | ICD-10-CM

## 2021-03-07 NOTE — Patient Instructions (Signed)
Medication Instructions:   Your physician recommends that you continue on your current medications as directed. Please refer to the Current Medication list given to you today.  Labwork:  none  Testing/Procedures:  none  Follow-Up:  Your physician recommends that you schedule a follow-up appointment in: 3 months.  Any Other Special Instructions Will Be Listed Below (If Applicable).  If you need a refill on your cardiac medications before your next appointment, please call your pharmacy. 

## 2021-03-07 NOTE — Telephone Encounter (Signed)
*  STAT* If patient is at the pharmacy, call can be transferred to refill team.   1. Which medications need to be refilled? (please list name of each medication and dose if known) albuterol (VENTOLIN HFA) 108 (90 Base) MCG/ACT inhaler SYMBICORT 160-4.5 MCG/ACT inhaler  2. Which pharmacy/location (including street and city if local pharmacy) is medication to be sent to the drug store in [place]   3. Do they need a 30 day or 90 day supply? [place]

## 2021-03-07 NOTE — Telephone Encounter (Signed)
Informed patient that our doctors here do not fill these types of medications.  He will need to request from his pcp.

## 2021-03-13 ENCOUNTER — Inpatient Hospital Stay: Payer: Medicare HMO | Admitting: Internal Medicine

## 2021-03-24 ENCOUNTER — Ambulatory Visit: Payer: Medicare HMO | Admitting: Cardiology

## 2021-05-12 ENCOUNTER — Inpatient Hospital Stay: Payer: Medicare HMO | Admitting: Internal Medicine

## 2021-05-20 ENCOUNTER — Encounter (HOSPITAL_COMMUNITY): Payer: Self-pay | Admitting: Licensed Clinical Social Worker

## 2021-05-20 ENCOUNTER — Telehealth (HOSPITAL_COMMUNITY): Payer: Self-pay | Admitting: Licensed Clinical Social Worker

## 2021-05-20 ENCOUNTER — Inpatient Hospital Stay: Payer: Medicare HMO | Admitting: Internal Medicine

## 2021-05-20 NOTE — Telephone Encounter (Signed)
CSW received referral from [place] requesting assistance with transportation needs for patient to upcoming appointments. CSW sent in enrollment form to [facility] and patient informed of process. Patient grateful for the support. Raquel Sarna, [place], [place]

## 2021-05-20 NOTE — Progress Notes (Deleted)
[name] [name], male    DOB: 07-30-1946,    MRN: [id number]   Brief patient profile:  87 yowm never had good ex tolerance?  attributed to wt and quit smoking 10/2019 with cough which improved with baseline limited more by knees than breathing and used [facility] parking, cane to get in store then ride the scooter    .Brief/Interim Summary: 75 y.o. male with medical history significant of CAD, history of NSTEMI/history of stent placement in 2018, LBBB, stage IV CKD, hypertension, history of pneumonia, hyperlipidemia, hypothyroidism, class III obesity, history of non-Hodgkin's lymphoma with chemo and radiotherapy, sleep apnea, peripheral neuropathy, type 2 diabetes mellitus who is brought to the emergency department due to progressively weakness and confusion since developing persisting cough after apparently aspirating water while drinking.    He was admitted with Covid pneumonia and concern for aspiration pneumonia.  This was witnessed by a family member who said he choked after drinking some water and then began to have respiratory distress afterwards.     .  Discharge Diagnoses:  Acute respiratory Failure due to COVID-19 -initially placed on 4L - stable on 2L>>RA -finished remdesivir 11/26 -changed to IV solumedrol -CRP 11.3>>8.6>>5.0>>2.8>>1.2 -D-Dimer 1.75>>1.45>>1.47>1.63 -Ferritin 495>>524>>436>>653>>479 -wean oxygen as tolerated   Aspiration Pneumonitis -evaluated by speech--regular with thin -initially on unasyn -PCT<0.10--d/cd abx     CKD stage 4 -baseline creatinine 2.3-2.6 -serum creatinine 2.08 on day of d/c     Diabetes mellitus type 2 -11/22 A1C--5.8 -elevated CBG due to steroids -added novolog 8 units with meals -increased levemir 15 unit bid -rec restart [place] after d/c   Acute metabolic Encephalopathy -due to dehydration and COVID-19 -improving   CAD -no chest pain -continue ASA, imdur, zocor -holding bisoprolol due to bradycardia   Essential  HTN -continue amlodipine and imdur   Hypothyroidism -continue synthroid     Thrombocytopenia -B12--525 -folate--7.0 -TSH--2.213 -due to viral infection       Deconditioning -PT-recommended SNF -patient  refused SNF> left ama     History of Present Illness  09/10/2020  Pulmonary/ 1st [name] eval/ [name] [name] / [name] [name]  Chief Complaint  Patient presents with   Consult    Shortness of breath with activity since November 2021  Dyspnea:  Uses walker limited to walking  room to room not titrating 02 but overall feels trending in right direction finally  Cough: not much at all  Sleep: bed is flat/ 3-4 pillows/ on side   SABA use: really confused with names of meds /  nebs  02  3lpm 24/7  Rec        Past Medical History:  Diagnosis Date   Atrial fibrillation ([place])    CAD (coronary artery disease)    DES to LAD June 2018   CKD (chronic kidney disease) stage 3, GFR 30-59 ml/min (HCC)    Essential hypertension    History of pneumonia    Hyperlipidemia    Hypothyroidism    LBBB (left bundle branch block)    Morbid obesity ([place])    Non Hodgkin's lymphoma ([place])    Status post XRT and chemotherapy   NSTEMI (non-ST elevated myocardial infarction) [facility])    June 2018   Peripheral neuropathy    Pneumonia due to COVID-19 virus    Sleep apnea    Type 2 diabetes mellitus ([place])         Objective:       05/20/2021          ***   09/10/20 Marland Kitchen)  307 lb (139.3 kg)  09/02/20 (!) 307 lb (139.3 kg)  08/10/20 (!) 310 lb 13.6 oz (141 kg)               Assessment

## 2021-05-30 ENCOUNTER — Inpatient Hospital Stay: Payer: Medicare HMO | Admitting: Internal Medicine

## 2021-06-04 ENCOUNTER — Encounter (HOSPITAL_COMMUNITY): Payer: Self-pay

## 2021-06-04 ENCOUNTER — Emergency Department (HOSPITAL_COMMUNITY): Payer: Medicare HMO

## 2021-06-04 ENCOUNTER — Emergency Department (HOSPITAL_COMMUNITY)
Admission: EM | Admit: 2021-06-04 | Discharge: 2021-06-04 | Disposition: A | Discharge status: Home / Self Care | Payer: Medicare HMO | Attending: Emergency Medicine | Admitting: Emergency Medicine

## 2021-06-04 ENCOUNTER — Other Ambulatory Visit: Payer: Self-pay

## 2021-06-04 DIAGNOSIS — N184 Chronic kidney disease, stage 4 (severe): Secondary | ICD-10-CM | POA: Insufficient documentation

## 2021-06-04 DIAGNOSIS — Z20822 Contact with and (suspected) exposure to covid-19: Secondary | ICD-10-CM | POA: Insufficient documentation

## 2021-06-04 DIAGNOSIS — J441 Chronic obstructive pulmonary disease with (acute) exacerbation: Secondary | ICD-10-CM | POA: Insufficient documentation

## 2021-06-04 DIAGNOSIS — Z79899 Other long term (current) drug therapy: Secondary | ICD-10-CM | POA: Insufficient documentation

## 2021-06-04 DIAGNOSIS — Z87891 Personal history of nicotine dependence: Secondary | ICD-10-CM | POA: Insufficient documentation

## 2021-06-04 DIAGNOSIS — Z96653 Presence of artificial knee joint, bilateral: Secondary | ICD-10-CM | POA: Diagnosis not present

## 2021-06-04 DIAGNOSIS — E039 Hypothyroidism, unspecified: Secondary | ICD-10-CM | POA: Insufficient documentation

## 2021-06-04 DIAGNOSIS — E1122 Type 2 diabetes mellitus with diabetic chronic kidney disease: Secondary | ICD-10-CM | POA: Diagnosis not present

## 2021-06-04 DIAGNOSIS — Z8616 Personal history of COVID-19: Secondary | ICD-10-CM | POA: Diagnosis not present

## 2021-06-04 DIAGNOSIS — I5043 Acute on chronic combined systolic (congestive) and diastolic (congestive) heart failure: Secondary | ICD-10-CM | POA: Diagnosis not present

## 2021-06-04 DIAGNOSIS — Z7901 Long term (current) use of anticoagulants: Secondary | ICD-10-CM | POA: Diagnosis not present

## 2021-06-04 DIAGNOSIS — Z794 Long term (current) use of insulin: Secondary | ICD-10-CM | POA: Insufficient documentation

## 2021-06-04 DIAGNOSIS — R609 Edema, unspecified: Secondary | ICD-10-CM | POA: Diagnosis present

## 2021-06-04 DIAGNOSIS — I4891 Unspecified atrial fibrillation: Secondary | ICD-10-CM | POA: Diagnosis not present

## 2021-06-04 DIAGNOSIS — I130 Hypertensive heart and chronic kidney disease with heart failure and stage 1 through stage 4 chronic kidney disease, or unspecified chronic kidney disease: Secondary | ICD-10-CM | POA: Insufficient documentation

## 2021-06-04 LAB — COMPREHENSIVE METABOLIC PANEL
ALT: 10 U/L (ref 0–44)
AST: 14 U/L — ABNORMAL LOW (ref 15–41)
Albumin: 4.6 g/dL (ref 3.5–5.0)
Alkaline Phosphatase: 110 U/L (ref 38–126)
Anion gap: 9 (ref 5–15)
BUN: 36 mg/dL — ABNORMAL HIGH (ref 8–23)
CO2: 28 mmol/L (ref 22–32)
Calcium: 9.5 mg/dL (ref 8.9–10.3)
Chloride: 103 mmol/L (ref 98–111)
Creatinine, Ser: 2.38 mg/dL — ABNORMAL HIGH (ref 0.61–1.24)
GFR, Estimated: 28 mL/min — ABNORMAL LOW (ref >60)
Glucose, Bld: 97 mg/dL (ref 70–99)
Potassium: 3.5 mmol/L (ref 3.5–5.1)
Sodium: 140 mmol/L (ref 135–145)
Total Bilirubin: 1.2 mg/dL (ref 0.3–1.2)
Total Protein: 8.5 g/dL — ABNORMAL HIGH (ref 6.5–8.1)

## 2021-06-04 LAB — CBC WITH DIFFERENTIAL/PLATELET
Abs Immature Granulocytes: 0.03 10*3/uL (ref 0.00–0.07)
Basophils Absolute: 0.1 10*3/uL (ref 0.0–0.1)
Basophils Relative: 1 %
Eosinophils Absolute: 0.1 10*3/uL (ref 0.0–0.5)
Eosinophils Relative: 1 %
HCT: 48.1 % (ref 39.0–52.0)
Hemoglobin: 15 g/dL (ref 13.0–17.0)
Immature Granulocytes: 0 %
Lymphocytes Relative: 14 %
Lymphs Abs: 1.1 10*3/uL (ref 0.7–4.0)
MCH: 28.1 pg (ref 26.0–34.0)
MCHC: 31.2 g/dL (ref 30.0–36.0)
MCV: 90.1 fL (ref 80.0–100.0)
Monocytes Absolute: 0.6 10*3/uL (ref 0.1–1.0)
Monocytes Relative: 7 %
Neutro Abs: 6.2 10*3/uL (ref 1.7–7.7)
Neutrophils Relative %: 77 %
Platelets: 136 10*3/uL — ABNORMAL LOW (ref 150–400)
RBC: 5.34 MIL/uL (ref 4.22–5.81)
RDW: 15.8 % — ABNORMAL HIGH (ref 11.5–15.5)
WBC: 8.1 10*3/uL (ref 4.0–10.5)
nRBC: 0 % (ref 0.0–0.2)

## 2021-06-04 LAB — BLOOD GAS, VENOUS
Acid-Base Excess: 4.3 mmol/L — ABNORMAL HIGH (ref 0.0–2.0)
Bicarbonate: 26.1 mmol/L (ref 20.0–28.0)
Drawn by: 6246
FIO2: 36
O2 Saturation: 54.8 %
Patient temperature: 37
pCO2, Ven: 52 mmHg (ref 44.0–60.0)
pH, Ven: 7.369 (ref 7.250–7.430)
pO2, Ven: 32.5 mmHg (ref 32.0–45.0)

## 2021-06-04 LAB — RESP PANEL BY RT-PCR (FLU A&B, COVID) ARPGX2
Influenza A by PCR: NEGATIVE
Influenza B by PCR: NEGATIVE
SARS Coronavirus 2 by RT PCR: NEGATIVE

## 2021-06-04 LAB — BRAIN NATRIURETIC PEPTIDE: B Natriuretic Peptide: 446 pg/mL — ABNORMAL HIGH (ref 0.0–100.0)

## 2021-06-04 MED ORDER — PREDNISONE 10 MG PO TABS: 30.0000 mg | ORAL_TABLET | Freq: Every day | ORAL | 0 refills | Status: AC

## 2021-06-04 MED ORDER — ALBUTEROL SULFATE HFA 108 (90 BASE) MCG/ACT IN AERS
4.0000 | INHALATION_SPRAY | Freq: Once | RESPIRATORY_TRACT | Status: AC
  Administered 2021-06-04: 4 via RESPIRATORY_TRACT
  Filled 2021-06-04: qty 6.7

## 2021-06-04 MED ORDER — ALBUTEROL SULFATE (2.5 MG/3ML) 0.083% IN NEBU
5.0000 mg | INHALATION_SOLUTION | Freq: Once | RESPIRATORY_TRACT | Status: DC
  Administered 2021-06-04: 5 mg via RESPIRATORY_TRACT

## 2021-06-04 MED ORDER — AZITHROMYCIN 250 MG PO TABS: 250.0000 mg | ORAL_TABLET | Freq: Every day | ORAL | 0 refills | Status: DC

## 2021-06-04 MED ORDER — ALBUTEROL SULFATE (2.5 MG/3ML) 0.083% IN NEBU
INHALATION_SOLUTION | RESPIRATORY_TRACT | Status: AC
  Filled 2021-06-04: qty 6

## 2021-06-04 NOTE — ED Triage Notes (Signed)
Pt arrived via [facility] w c/o SOB et edema in BLE.

## 2021-06-04 NOTE — ED Notes (Signed)
Pt was ambulated with 2 tech assist to restroom at this time and back to stretcher without any incident. [name] [name]

## 2021-06-04 NOTE — Discharge Instructions (Signed)
Cough, shortness of breath suspect he is having from COPD exacerbation of starting on antibiotics, given your inhaler, and steroids to be taken as prescribed. Edema-likely you have some extra fluid on you, please continue take your fluid pill as prescribed you, like to follow-up with your cardiologist for further evaluation.  Come back to the emergency department if you develop chest pain, shortness of breath, severe abdominal pain, uncontrolled nausea, vomiting, diarrhea.

## 2021-06-04 NOTE — ED Provider Notes (Signed)
[facility] EMERGENCY DEPARTMENT Provider Note   CSN: [id number] Arrival date & time: 06/04/21  1610     History Chief Complaint  Patient presents with   Shortness of Breath    [name] [name] is a 75 y.o. male.  HPI  Patient with significant medical history including CHF, CAD, A. fib currently on Eliquis, stage IV CKD, COPD, hypertension, diabetes presents with chief complaint of abdominal and lower limb edema.  Patient states been going for last week, states it came on suddenly, states that he has noticed his legs have become more swollen and that his belly looks larger.  He states that he has been taking his torsemide as prescribed but states he has not been urinating as much.  He denies  difficulty urination, dysuria, hematuria, denies any flank pain, nausea, vomiting, or stomach pain, patient states he simply has not been drinking a lot of fluids.  He also notes that he has had a stuffy nose, going last couple days, states it makes it difficult for him to breathe through his nostrils, he denies fevers, chills, sore throat, has a slight cough, denies general body aches.  Patient does not endorse any worsening shortness of breath, orthopnea, dyspnea on exertion, he has no other complaints at this time.  States that he has been on the same amount of O2 like he normally is 3 to 4 L.  Past Medical History:  Diagnosis Date   Atrial fibrillation ([place])    CAD (coronary artery disease)    DES to LAD June 2018   CKD (chronic kidney disease) stage 3, GFR 30-59 ml/min (HCC)    Essential hypertension    History of pneumonia    Hyperlipidemia    Hypothyroidism    LBBB (left bundle branch block)    Morbid obesity ([place])    Non Hodgkin's lymphoma ([place])    Status post XRT and chemotherapy   NSTEMI (non-ST elevated myocardial infarction) [facility])    June 2018   Peripheral neuropathy    Pneumonia due to COVID-19 virus    Sleep apnea    Type 2 diabetes mellitus [facility])     Patient Active Problem  List   Diagnosis Date Noted   Acute on chronic respiratory failure with hypercapnia (HCC)    CKD (chronic kidney disease) stage 4, GFR 15-29 ml/min ([place]) 01/21/2021   Pleural effusion 01/20/2021   Thrombocytopenia ([place]) 01/20/2021   Elevated d-dimer 01/20/2021   Atrial fibrillation, chronic ([place]) 01/20/2021   CHF exacerbation ([place]) 12/28/2020   CHF (congestive heart failure) ([place]) 12/28/2020   Acute on chronic diastolic HF (heart failure) ([place]) 10/06/2020   Chronic respiratory failure with hypoxia and hypercapnia ([place]) 09/10/2020   Former smoker 08/10/2020   COPD (chronic obstructive pulmonary disease) ([place]) 08/10/2020   Acute on chronic diastolic CHF (congestive heart failure) ([place]) 08/05/2020   Acute respiratory failure with hypoxia ([place]) 08/04/2020   Acute exacerbation of CHF (congestive heart failure) ([place]) 07/19/2020   AF (paroxysmal atrial fibrillation) ([place]) 07/19/2020   COVID-19    Acute on chronic respiratory failure with hypoxia and hypercapnia ([place]) 06/25/2020   Aspiration pneumonia ([place]) 06/24/2020   Pneumonia due to COVID-19 virus 06/24/2020   Confusion    Small bowel obstruction ([place]) 01/29/2020   SBO (small bowel obstruction) ([place]) 01/28/2020   Primary osteoarthritis of left knee 05/24/2018   Primary localized osteoarthritis of left knee 05/19/2018   Acute on chronic systolic and diastolic heart failure, NYHA class 1 ([place]) 04/01/2017   CAD (coronary  artery disease) 04/01/2017   DOE (dyspnea on exertion) 03/21/2017   HTN (hypertension) 03/21/2017   NSTEMI (non-ST elevated myocardial infarction) ([place]) 01/19/2017   NSTEMI, initial episode of care ([place]) 01/19/2017   Sepsis ([place]) 07/24/2016   Elevated troponin 07/24/2016   Fever 07/24/2016   Lactic acidosis 07/24/2016   Adjustment insomnia 05/18/2016   Erectile dysfunction 12/19/2015   Sleep apnea 09/30/2015   Osteoarthritis 09/30/2015   Hypothyroidism 09/30/2015   Hypogonadism in male 09/30/2015   Diabetic  neuropathy ([place]) 09/30/2015   Chronic pain of left knee 09/30/2015   Benign essential hypertension 09/30/2015   Acute on chronic renal failure ([place]) 05/14/2015   Diarrhea 05/14/2015   Dehydration 05/14/2015   Hypokalemia 05/14/2015   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   Pelvic mass in male    Varices, esophageal (HCC)    Colonic mass    Abnormal CT scan, pelvis    Bladder mass    Elevated liver enzymes    Elevated LFTs    Abdominal pain 07/23/2014   Chronic kidney disease Stage IV 07/23/2014   Morbid obesity ([place]) 07/23/2014   Type 2 diabetes mellitus with stage 4 chronic kidney disease ([place]) 07/23/2014   Hypertension 07/23/2014   S/P total knee replacement, 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    Past Surgical History:  Procedure Laterality Date   CHOLECYSTECTOMY  1992   COLONOSCOPY N/A 09/03/2014   SLF:six colon polyps removed/small internal hemorrhoids   CORONARY STENT INTERVENTION N/A 01/21/2017   Procedure: Coronary Stent Intervention;  Surgeon: Nelva Bush, MD;  Location: [facility] [facility];  Service: Cardiovascular;  Laterality: N/A;   ESOPHAGOGASTRODUODENOSCOPY N/A 09/03/2014   SLF: mild gastritis/few gastric polyps   LEFT HEART CATH AND CORONARY ANGIOGRAPHY N/A 01/20/2017   Procedure: Left Heart Cath and Coronary Angiography;  Surgeon: Jettie Booze, MD;  Location: [facility] [facility];  Service: Cardiovascular;  Laterality: N/A;   TOTAL KNEE ARTHROPLASTY  11/10/2011   Procedure: TOTAL KNEE ARTHROPLASTY;  Surgeon: Mauri Pole, MD;  Location: [facility];  Service: Orthopedics;  Laterality: Right;   TOTAL KNEE ARTHROPLASTY Left 05/24/2018   Procedure: LEFT TOTAL KNEE ARTHROPLASTY;  Surgeon: Melrose Nakayama, MD;  Location: [facility];  Service: Orthopedics;  Laterality: Left;       Family History  Problem Relation Age of Onset   Cancer Mother        breast and lung   Cancer Father        bladder   Cancer Maternal Uncle        prostate   Cancer Paternal Uncle        esophagus    Colon cancer Neg Hx     Social History   Tobacco Use   Smoking status: Former    Packs/day: 0.25    Years: 30.00    Pack years: 7.50    Types: Cigarettes    Quit date: 10/2019    Years since quitting: 1.6   Smokeless tobacco: Never  Vaping Use   Vaping Use: Never used  Substance Use Topics   Alcohol use: No    Alcohol/week: 0.0 standard drinks   Drug use: No    Home Medications Prior to Admission medications   Medication Sig Start Date End Date Taking? Authorizing Provider  acetaminophen (TYLENOL) 325 MG tablet Take 2 tablets (650 mg total) by mouth every 6 (six) hours as needed for mild pain (or Fever >/= 101). 02/01/20  Yes Roxan Hockey, MD  albuterol (VENTOLIN HFA) 108 (90 Base) MCG/ACT inhaler  Inhale 1-2 puffs into the lungs every 6 (six) hours as needed for wheezing or shortness of breath. 01/10/19  Yes Long, Wonda Olds, MD  apixaban (ELIQUIS) 5 MG TABS tablet Take 1 tablet (5 mg total) by mouth 2 (two) times daily. This is a dose change 01/07/21  Yes Verta Ellen., NP  azithromycin (ZITHROMAX) 250 MG tablet Take 1 tablet (250 mg total) by mouth daily. Take first 2 tablets together, then 1 every day until finished. 06/04/21  Yes Marcello Fennel, PA-C  bisoprolol (ZEBETA) 5 MG tablet Take 5 mg by mouth daily. 11/26/20  Yes [provider]  furosemide (LASIX) 40 MG tablet Take 40 mg by mouth 2 (two) times daily. Pt ran out of Torsemide so he started taking Furosemide old rx   Yes [provider]  insulin degludec (TRESIBA) 100 UNIT/ML FlexTouch Pen Inject 10 Units into the skin daily at 10 pm. Patient taking differently: Inject 26 Units into the skin daily at 10 pm. 01/24/21  Yes Johnson, Clanford L, MD  ipratropium-albuterol (DUONEB) 0.5-2.5 (3) MG/3ML SOLN Inhale 3 mLs into the lungs every 4 (four) hours as needed (shortness of breath). 05/22/20  Yes [provider]  isosorbide mononitrate (IMDUR) 30 MG 24 hr tablet Take 1 tablet (30 mg total) by  mouth daily. 12/04/20  Yes Satira Sark, MD  levocetirizine (XYZAL) 5 MG tablet Take 5 mg by mouth every evening.  08/27/17  Yes [provider]  levothyroxine (SYNTHROID) 100 MCG tablet Take 100 mcg by mouth daily before breakfast. (Takes with 200 mcg tab for a total of 300 mcg once daily)   Yes [provider]  levothyroxine (SYNTHROID, LEVOTHROID) 200 MCG tablet Take 300 mcg by mouth daily before breakfast. (takes with 12mcg tab for a total of 327mcg)   Yes [provider]  nitroGLYCERIN (NITROSTAT) 0.4 MG SL tablet Place 0.4 mg under the tongue every 5 (five) minutes as needed for chest pain.  12/27/17  Yes [provider]  OXYGEN Inhale 3-4 L into the lungs daily as needed (low oxygen).   Yes [provider]  predniSONE (DELTASONE) 10 MG tablet Take 3 tablets (30 mg total) by mouth daily for 3 days. 06/04/21 06/07/21 Yes Marcello Fennel, PA-C  senna-docusate (SENOKOT-S) 8.6-50 MG tablet Take 1 tablet by mouth daily as needed for mild constipation. 02/01/20  Yes [provider]  simvastatin (ZOCOR) 5 MG tablet Take 5 mg by mouth daily. 09/14/16  Yes [provider]  sodium chloride (OCEAN) 0.65 % SOLN nasal spray Place 1 spray into both nostrils as needed for congestion.   Yes [provider]  SYMBICORT 160-4.5 MCG/ACT inhaler Inhale 2 puffs into the lungs daily as needed (shortness of breath). 12/22/19  Yes [provider]  chlorhexidine (PERIDEX) 0.12 % solution 15 mLs by Mouth Rinse route 2 (two) times daily. Patient not taking: No sig reported 12/31/20   Shahmehdi, Erling Conte A, MD  potassium chloride SA (KLOR-CON) 10 MEQ tablet Take 1 tablet (10 mEq total) by mouth daily. Patient not taking: Reported on 06/04/2021 01/24/21   Murlean Iba, MD  torsemide 40 MG TABS Take 40 mg by mouth 2 (two) times daily. Patient not taking: No sig reported 02/07/21 06/04/21  Manuella Ghazi, Pratik D, DO  insulin aspart (NOVOLOG) 100 UNIT/ML  injection Inject 0-15 Units into the skin 3 (three) times daily with meals. Patient not taking: No sig reported 12/31/20 01/22/21  Deatra James, MD  Allergies    Patient has no known allergies.  Review of Systems   Review of Systems  Constitutional:  Negative for chills and fever.  HENT:  Positive for congestion. Negative for sore throat.   Respiratory:  Positive for cough. Negative for shortness of breath.   Cardiovascular:  Positive for leg swelling. Negative for chest pain.  Gastrointestinal:  Negative for abdominal pain, diarrhea, nausea and vomiting.  Genitourinary:  Positive for decreased urine volume. Negative for difficulty urinating, dysuria, enuresis and flank pain.  Musculoskeletal:  Negative for back pain.  Skin:  Negative for rash.  Neurological:  Negative for dizziness.  Hematological:  Does not bruise/bleed easily.   Physical Exam Updated Vital Signs BP (!) 145/103   Pulse (!) 113   Temp 97.6 F (36.4 C) (Oral)   Resp 16   Ht 6' (1.829 m)   Wt (!) 138.5 kg   SpO2 90%   BMI 41.41 kg/m   Physical Exam Vitals and nursing note reviewed.  Constitutional:      General: He is not in acute distress.    Appearance: He is not ill-appearing.     Comments: Deconditioned state, sitting in the side of the bed, with nasal cannula  HENT:     Head: Normocephalic and atraumatic.     Nose: Congestion present.     Mouth/Throat:     Mouth: Mucous membranes are dry.     Pharynx: Oropharynx is clear.  Eyes:     Conjunctiva/sclera: Conjunctivae normal.  Cardiovascular:     Rate and Rhythm: Normal rate and regular rhythm.     Pulses: Normal pulses.     Heart sounds: No murmur heard.   No friction rub. No gallop.  Pulmonary:     Effort: No respiratory distress.     Breath sounds: No wheezing, rhonchi or rales.     Comments: Patient did not appear to be in respiratory distress, he has a nasal cannula in place at 3 L, slightly tachypneic, O2 sats in the low 90s,  patient had tight sounding chest, intermittent wheezing present, slight rales heard in the left lower lobe, no rhonchi present. Abdominal:     Palpations: Abdomen is soft.     Tenderness: There is no abdominal tenderness. There is no right CVA tenderness or left CVA tenderness.     Comments: Abdomen nondistended, dull to percussion, normal bowel sounds, no fluid wave present, abdomen soft nontender to palpation, no guarding, rebound tenderness, peritoneal sign.  Musculoskeletal:     Right lower leg: Edema present.     Left lower leg: Edema present.     Comments: Patient noted chronic lymphedema skin changes, no ulcers present, he has 1+ pitting edema to the shins, neurovascular is fully intact.  Skin:    General: Skin is warm and dry.  Neurological:     Mental Status: He is alert.  Psychiatric:        Mood and Affect: Mood normal.    ED Results / Procedures / Treatments   Labs (all labs ordered are listed, but only abnormal results are displayed) Labs Reviewed  COMPREHENSIVE METABOLIC PANEL - Abnormal; Notable for the following components:      Result Value   BUN 36 (*)    Creatinine, Ser 2.38 (*)    Total Protein 8.5 (*)    AST 14 (*)    GFR, Estimated 28 (*)    All other components within normal limits  BRAIN NATRIURETIC PEPTIDE - Abnormal; Notable for the  following components:   B Natriuretic Peptide 446.0 (*)    All other components within normal limits  CBC WITH DIFFERENTIAL/PLATELET - Abnormal; Notable for the following components:   RDW 15.8 (*)    Platelets 136 (*)    All other components within normal limits  BLOOD GAS, VENOUS - Abnormal; Notable for the following components:   Acid-Base Excess 4.3 (*)    All other components within normal limits  RESP PANEL BY RT-PCR (FLU A&B, COVID) [id number]    EKG EKG Interpretation  Date/Time:  Wednesday June 04 2021 17:53:47 EDT Ventricular Rate:  77 PR Interval:    QRS Duration: 146 QT Interval:  449 QTC  Calculation: 509 R Axis:   256 Text Interpretation: Atrial fibrillation Nonspecific IVCD with LAD Inferior infarct, old Anterior infarct, old No acute changes Nonspecific ST and T wave abnormality Confirmed by Varney Biles ([id number]) on 06/04/2021 7:03:59 PM  Radiology DG Chest Port 1 View  Result Date: 06/04/2021 CLINICAL DATA:  Short of breath EXAM: PORTABLE CHEST 1 VIEW COMPARISON:  01/30/2021 FINDINGS: Cardiac enlargement. Pulmonary vascularity with mild interstitial edema. Small bilateral effusions and bibasilar atelectasis. IMPRESSION: Congestive heart failure with mild interstitial edema and small bilateral effusions. Bibasilar atelectasis. Electronically Signed   By: Franchot Gallo M.D.   On: 06/04/2021 18:51    Procedures Procedures   Medications Ordered in ED Medications  albuterol (PROVENTIL) (2.5 MG/3ML) 0.083% nebulizer solution (  Not Given 06/04/21 2101)  albuterol (VENTOLIN HFA) 108 (90 Base) MCG/ACT inhaler 4 puff (4 puffs Inhalation Given 06/04/21 1818)    ED Course  I have reviewed the triage vital signs and the nursing notes.  Pertinent labs & imaging results that were available during my care of the patient were reviewed by me and considered in my medical decision making (see chart for details).    MDM Rules/Calculators/A&P                          Initial impression-presents with worsening leg swelling.  He is alert, does not appear congested, vital signs are reassuring.  Unclear etiology possible COPD exacerbation versus CHF exacerbation will obtain basic lab work-up, provide patient albuterol and reassess.  Work-up-CBC is unremarkable, CMP shows creatinine at 2.38 appears to be at baseline, B NP slightly elevated at 446 slightly elevated above baseline, respiratory panel unremarkable.  Blood gas unremarkable, chest x-ray reveals congestive heart failure with mild interstitial edema and small bilateral effusions, EKG A. fib without signs of  ischemia.  Reassessment-patient was given 2 puffs of albuterol, lung sounds have slightly improved but still has some wheezing still slightly tight my exam.  Will order nebulizer treatment and reassess.  Patient is reassessed after nebulizer treatment, lung sounds have improved only occasional wheezing present, chest is not as tight as before, vital signs have improved nontachypneic, nonhypoxic, patient agreed for discharge at this time.  Rule out- I have low suspicion for ACS as history is atypical, EKG  signs of ischemia, troponins will be deferred as he had no chest pain.  Low suspicion for PE as patient denies pleuritic chest pain, shortness of breath, no unilateral leg swelling present, presentation atypical of etiology, currently on anticoagulant.  Low suspicion for AAA or aortic dissection as history is atypical, patient has low risk factors.  Have low suspicion for CHF exacerbation requiring hospitalization as BNP is only slightly elevated above baseline, chest x-ray similar to previous exams.  Low suspicion for systemic infection as  patient is nontoxic-appearing, vital signs reassuring, no obvious source infection noted on exam.   Plan-  Edema and cough-likely patient suffering from COPD as well as CHF acute on chronic exacerbation, patient is currently on torsemide will have him continue with this dose, follow-up with cardiology for further evaluation.  In regards to COPD he does have nasal ingestion with a productive cough will start him on antibiotics, give him an inhaler, start a short course of steroids follow with PCP for further evaluation.  Given strict return precautions.   Vital signs have remained stable, no indication for hospital admission.  Patient discussed with attending and they agreed with assessment and plan.  Patient given at home care as well strict return precautions.  Patient verbalized that they understood agreed to said plan.  Final Clinical Impression(s) / ED  Diagnoses Final diagnoses:  COPD exacerbation ([place])  Peripheral edema    Rx / DC Orders ED Discharge Orders          Ordered    azithromycin (ZITHROMAX) 250 MG tablet  Daily        06/04/21 2109    predniSONE (DELTASONE) 10 MG tablet  Daily        06/04/21 2109             Marcello Fennel, PA-C 06/04/21 2111    Varney Biles, MD 06/05/21 2050

## 2021-06-10 ENCOUNTER — Telehealth: Payer: Self-pay | Admitting: Cardiology

## 2021-06-10 NOTE — Telephone Encounter (Signed)
New Message:    He said he called for transportation and they told him to have somebody from Dr McDowell's office to call them at [phone].

## 2021-06-10 NOTE — Telephone Encounter (Signed)
Advised that his enrollment form has been emailed to transportation service and he needed to call [phone]

## 2021-06-11 ENCOUNTER — Inpatient Hospital Stay (HOSPITAL_COMMUNITY)
Admission: EM | Admit: 2021-06-11 | Discharge: 2021-06-26 | DRG: 286 | Disposition: A | Discharge status: Home Health | Payer: Medicare HMO | Attending: Family Medicine | Admitting: Family Medicine

## 2021-06-11 ENCOUNTER — Ambulatory Visit: Payer: Medicare HMO | Admitting: Primary Care

## 2021-06-11 ENCOUNTER — Encounter (HOSPITAL_COMMUNITY): Payer: Self-pay | Admitting: Internal Medicine

## 2021-06-11 ENCOUNTER — Other Ambulatory Visit: Payer: Self-pay

## 2021-06-11 ENCOUNTER — Emergency Department (HOSPITAL_COMMUNITY): Payer: Medicare HMO

## 2021-06-11 DIAGNOSIS — Z794 Long term (current) use of insulin: Secondary | ICD-10-CM

## 2021-06-11 DIAGNOSIS — E876 Hypokalemia: Secondary | ICD-10-CM | POA: Diagnosis not present

## 2021-06-11 DIAGNOSIS — E662 Morbid (severe) obesity with alveolar hypoventilation: Secondary | ICD-10-CM | POA: Diagnosis present

## 2021-06-11 DIAGNOSIS — I5033 Acute on chronic diastolic (congestive) heart failure: Secondary | ICD-10-CM | POA: Diagnosis not present

## 2021-06-11 DIAGNOSIS — Z8571 Personal history of Hodgkin lymphoma: Secondary | ICD-10-CM

## 2021-06-11 DIAGNOSIS — I5043 Acute on chronic combined systolic (congestive) and diastolic (congestive) heart failure: Secondary | ICD-10-CM | POA: Diagnosis present

## 2021-06-11 DIAGNOSIS — I130 Hypertensive heart and chronic kidney disease with heart failure and stage 1 through stage 4 chronic kidney disease, or unspecified chronic kidney disease: Principal | ICD-10-CM | POA: Diagnosis present

## 2021-06-11 DIAGNOSIS — E1122 Type 2 diabetes mellitus with diabetic chronic kidney disease: Secondary | ICD-10-CM | POA: Diagnosis present

## 2021-06-11 DIAGNOSIS — I447 Left bundle-branch block, unspecified: Secondary | ICD-10-CM | POA: Diagnosis present

## 2021-06-11 DIAGNOSIS — Z9114 Patient's other noncompliance with medication regimen: Secondary | ICD-10-CM

## 2021-06-11 DIAGNOSIS — I272 Pulmonary hypertension, unspecified: Secondary | ICD-10-CM | POA: Diagnosis present

## 2021-06-11 DIAGNOSIS — Z8616 Personal history of COVID-19: Secondary | ICD-10-CM

## 2021-06-11 DIAGNOSIS — I252 Old myocardial infarction: Secondary | ICD-10-CM

## 2021-06-11 DIAGNOSIS — I480 Paroxysmal atrial fibrillation: Secondary | ICD-10-CM | POA: Diagnosis present

## 2021-06-11 DIAGNOSIS — E785 Hyperlipidemia, unspecified: Secondary | ICD-10-CM | POA: Diagnosis present

## 2021-06-11 DIAGNOSIS — Z809 Family history of malignant neoplasm, unspecified: Secondary | ICD-10-CM

## 2021-06-11 DIAGNOSIS — Z955 Presence of coronary angioplasty implant and graft: Secondary | ICD-10-CM

## 2021-06-11 DIAGNOSIS — Z0000 Encounter for general adult medical examination without abnormal findings: Secondary | ICD-10-CM

## 2021-06-11 DIAGNOSIS — I10 Essential (primary) hypertension: Secondary | ICD-10-CM | POA: Diagnosis present

## 2021-06-11 DIAGNOSIS — Z7901 Long term (current) use of anticoagulants: Secondary | ICD-10-CM

## 2021-06-11 DIAGNOSIS — Z87891 Personal history of nicotine dependence: Secondary | ICD-10-CM

## 2021-06-11 DIAGNOSIS — M109 Gout, unspecified: Secondary | ICD-10-CM | POA: Diagnosis present

## 2021-06-11 DIAGNOSIS — Z6841 Body Mass Index (BMI) 40.0 and over, adult: Secondary | ICD-10-CM

## 2021-06-11 DIAGNOSIS — Z96653 Presence of artificial knee joint, bilateral: Secondary | ICD-10-CM | POA: Diagnosis present

## 2021-06-11 DIAGNOSIS — I509 Heart failure, unspecified: Secondary | ICD-10-CM

## 2021-06-11 DIAGNOSIS — Z20822 Contact with and (suspected) exposure to covid-19: Secondary | ICD-10-CM | POA: Diagnosis present

## 2021-06-11 DIAGNOSIS — Z9981 Dependence on supplemental oxygen: Secondary | ICD-10-CM

## 2021-06-11 DIAGNOSIS — E1142 Type 2 diabetes mellitus with diabetic polyneuropathy: Secondary | ICD-10-CM | POA: Diagnosis present

## 2021-06-11 DIAGNOSIS — J9622 Acute and chronic respiratory failure with hypercapnia: Secondary | ICD-10-CM | POA: Diagnosis present

## 2021-06-11 DIAGNOSIS — N189 Chronic kidney disease, unspecified: Secondary | ICD-10-CM | POA: Diagnosis present

## 2021-06-11 DIAGNOSIS — I2510 Atherosclerotic heart disease of native coronary artery without angina pectoris: Secondary | ICD-10-CM | POA: Diagnosis present

## 2021-06-11 DIAGNOSIS — J449 Chronic obstructive pulmonary disease, unspecified: Secondary | ICD-10-CM | POA: Diagnosis present

## 2021-06-11 DIAGNOSIS — L309 Dermatitis, unspecified: Secondary | ICD-10-CM | POA: Diagnosis present

## 2021-06-11 DIAGNOSIS — J9611 Chronic respiratory failure with hypoxia: Secondary | ICD-10-CM | POA: Diagnosis present

## 2021-06-11 DIAGNOSIS — Z8701 Personal history of pneumonia (recurrent): Secondary | ICD-10-CM

## 2021-06-11 DIAGNOSIS — E039 Hypothyroidism, unspecified: Secondary | ICD-10-CM | POA: Diagnosis present

## 2021-06-11 DIAGNOSIS — Z79890 Hormone replacement therapy (postmenopausal): Secondary | ICD-10-CM

## 2021-06-11 DIAGNOSIS — Z7951 Long term (current) use of inhaled steroids: Secondary | ICD-10-CM

## 2021-06-11 DIAGNOSIS — J9612 Chronic respiratory failure with hypercapnia: Secondary | ICD-10-CM | POA: Diagnosis present

## 2021-06-11 DIAGNOSIS — Z9221 Personal history of antineoplastic chemotherapy: Secondary | ICD-10-CM

## 2021-06-11 DIAGNOSIS — Z923 Personal history of irradiation: Secondary | ICD-10-CM

## 2021-06-11 DIAGNOSIS — N184 Chronic kidney disease, stage 4 (severe): Secondary | ICD-10-CM | POA: Diagnosis present

## 2021-06-11 DIAGNOSIS — J9621 Acute and chronic respiratory failure with hypoxia: Secondary | ICD-10-CM | POA: Diagnosis present

## 2021-06-11 DIAGNOSIS — Z79899 Other long term (current) drug therapy: Secondary | ICD-10-CM

## 2021-06-11 DIAGNOSIS — I4821 Permanent atrial fibrillation: Secondary | ICD-10-CM | POA: Diagnosis present

## 2021-06-11 DIAGNOSIS — R0602 Shortness of breath: Secondary | ICD-10-CM | POA: Diagnosis not present

## 2021-06-11 DIAGNOSIS — N179 Acute kidney failure, unspecified: Secondary | ICD-10-CM | POA: Diagnosis not present

## 2021-06-11 LAB — COMPREHENSIVE METABOLIC PANEL
ALT: 10 U/L (ref 0–44)
AST: 13 U/L — ABNORMAL LOW (ref 15–41)
Albumin: 3.6 g/dL (ref 3.5–5.0)
Alkaline Phosphatase: 100 U/L (ref 38–126)
Anion gap: 11 (ref 5–15)
BUN: 35 mg/dL — ABNORMAL HIGH (ref 8–23)
CO2: 28 mmol/L (ref 22–32)
Calcium: 8.7 mg/dL — ABNORMAL LOW (ref 8.9–10.3)
Chloride: 103 mmol/L (ref 98–111)
Creatinine, Ser: 2.48 mg/dL — ABNORMAL HIGH (ref 0.61–1.24)
GFR, Estimated: 27 mL/min — ABNORMAL LOW (ref >60)
Glucose, Bld: 88 mg/dL (ref 70–99)
Potassium: 3.5 mmol/L (ref 3.5–5.1)
Sodium: 142 mmol/L (ref 135–145)
Total Bilirubin: 1 mg/dL (ref 0.3–1.2)
Total Protein: 6.5 g/dL (ref 6.5–8.1)

## 2021-06-11 LAB — RESP PANEL BY RT-PCR (FLU A&B, COVID) ARPGX2
Influenza A by PCR: NEGATIVE
Influenza B by PCR: NEGATIVE
SARS Coronavirus 2 by RT PCR: NEGATIVE

## 2021-06-11 LAB — CBC WITH DIFFERENTIAL/PLATELET
Abs Immature Granulocytes: 0.04 10*3/uL (ref 0.00–0.07)
Basophils Absolute: 0.1 10*3/uL (ref 0.0–0.1)
Basophils Relative: 1 %
Eosinophils Absolute: 0.4 10*3/uL (ref 0.0–0.5)
Eosinophils Relative: 4 %
HCT: 44.3 % (ref 39.0–52.0)
Hemoglobin: 14 g/dL (ref 13.0–17.0)
Immature Granulocytes: 1 %
Lymphocytes Relative: 14 %
Lymphs Abs: 1.2 10*3/uL (ref 0.7–4.0)
MCH: 28.3 pg (ref 26.0–34.0)
MCHC: 31.6 g/dL (ref 30.0–36.0)
MCV: 89.7 fL (ref 80.0–100.0)
Monocytes Absolute: 0.5 10*3/uL (ref 0.1–1.0)
Monocytes Relative: 6 %
Neutro Abs: 6.3 10*3/uL (ref 1.7–7.7)
Neutrophils Relative %: 74 %
Platelets: 141 10*3/uL — ABNORMAL LOW (ref 150–400)
RBC: 4.94 MIL/uL (ref 4.22–5.81)
RDW: 16.2 % — ABNORMAL HIGH (ref 11.5–15.5)
WBC: 8.4 10*3/uL (ref 4.0–10.5)
nRBC: 0 % (ref 0.0–0.2)

## 2021-06-11 LAB — TROPONIN I (HIGH SENSITIVITY)
Troponin I (High Sensitivity): 24 ng/L — ABNORMAL HIGH (ref <18)
Troponin I (High Sensitivity): 26 ng/L — ABNORMAL HIGH (ref <18)

## 2021-06-11 LAB — CBG MONITORING, ED: Glucose-Capillary: 90 mg/dL (ref 70–99)

## 2021-06-11 LAB — MRSA NEXT GEN BY PCR, NASAL: MRSA by PCR Next Gen: NOT DETECTED

## 2021-06-11 LAB — GLUCOSE, CAPILLARY: Glucose-Capillary: 110 mg/dL — ABNORMAL HIGH (ref 70–99)

## 2021-06-11 LAB — BRAIN NATRIURETIC PEPTIDE: B Natriuretic Peptide: 199.1 pg/mL — ABNORMAL HIGH (ref 0.0–100.0)

## 2021-06-11 MED ORDER — INSULIN ASPART 100 UNIT/ML IJ SOLN
0.0000 [IU] | Freq: Three times a day (TID) | INTRAMUSCULAR | Status: DC
  Administered 2021-06-12 – 2021-06-13 (×2): 3 [IU] via SUBCUTANEOUS
  Administered 2021-06-13 – 2021-06-14 (×3): 2 [IU] via SUBCUTANEOUS
  Administered 2021-06-15: 3 [IU] via SUBCUTANEOUS
  Administered 2021-06-16: 2 [IU] via SUBCUTANEOUS
  Administered 2021-06-16: 3 [IU] via SUBCUTANEOUS
  Administered 2021-06-19: 16:00:00 2 [IU] via SUBCUTANEOUS
  Administered 2021-06-22 (×2): 3 [IU] via SUBCUTANEOUS
  Administered 2021-06-22: 5 [IU] via SUBCUTANEOUS
  Administered 2021-06-24: 3 [IU] via SUBCUTANEOUS
  Administered 2021-06-25: 2 [IU] via SUBCUTANEOUS

## 2021-06-11 MED ORDER — LORATADINE 10 MG PO TABS
5.0000 mg | ORAL_TABLET | Freq: Every day | ORAL | Status: DC
  Administered 2021-06-11 – 2021-06-26 (×16): 5 mg via ORAL
  Filled 2021-06-11 (×16): qty 1

## 2021-06-11 MED ORDER — INSULIN ASPART 100 UNIT/ML IJ SOLN
0.0000 [IU] | Freq: Every day | INTRAMUSCULAR | Status: DC
  Administered 2021-06-12: 2 [IU] via SUBCUTANEOUS
  Administered 2021-06-14: 4 [IU] via SUBCUTANEOUS
  Administered 2021-06-21: 2 [IU] via SUBCUTANEOUS

## 2021-06-11 MED ORDER — CETIRIZINE HCL 10 MG PO TABS
5.0000 mg | ORAL_TABLET | Freq: Every evening | ORAL | Status: DC
  Filled 2021-06-11: qty 1

## 2021-06-11 MED ORDER — POLYETHYLENE GLYCOL 3350 17 G PO PACK: 17.0000 g | PACK | Freq: Every day | ORAL | Status: DC | PRN

## 2021-06-11 MED ORDER — APIXABAN 5 MG PO TABS
5.0000 mg | ORAL_TABLET | Freq: Two times a day (BID) | ORAL | Status: DC
  Administered 2021-06-11 – 2021-06-18 (×15): 5 mg via ORAL
  Filled 2021-06-11 (×15): qty 1

## 2021-06-11 MED ORDER — BISOPROLOL FUMARATE 5 MG PO TABS
5.0000 mg | ORAL_TABLET | Freq: Every day | ORAL | Status: DC
  Administered 2021-06-12 – 2021-06-17 (×6): 5 mg via ORAL
  Filled 2021-06-11 (×6): qty 1

## 2021-06-11 MED ORDER — OXYCODONE HCL 5 MG PO TABS
5.0000 mg | ORAL_TABLET | ORAL | Status: DC | PRN
  Administered 2021-06-18: 5 mg via ORAL
  Filled 2021-06-11 (×2): qty 1

## 2021-06-11 MED ORDER — HYDRALAZINE HCL 20 MG/ML IJ SOLN: 5.0000 mg | INTRAMUSCULAR | Status: DC | PRN

## 2021-06-11 MED ORDER — FUROSEMIDE 10 MG/ML IJ SOLN
40.0000 mg | Freq: Once | INTRAMUSCULAR | Status: AC
  Administered 2021-06-11: 40 mg via INTRAVENOUS
  Filled 2021-06-11: qty 4

## 2021-06-11 MED ORDER — PREDNISONE 20 MG PO TABS
40.0000 mg | ORAL_TABLET | Freq: Every day | ORAL | Status: AC
  Administered 2021-06-12 – 2021-06-15 (×4): 40 mg via ORAL
  Filled 2021-06-11 (×5): qty 2

## 2021-06-11 MED ORDER — MOMETASONE FURO-FORMOTEROL FUM 200-5 MCG/ACT IN AERO
2.0000 | INHALATION_SPRAY | Freq: Two times a day (BID) | RESPIRATORY_TRACT | Status: DC
  Administered 2021-06-12 – 2021-06-26 (×30): 2 via RESPIRATORY_TRACT
  Filled 2021-06-11: qty 8.8

## 2021-06-11 MED ORDER — IPRATROPIUM-ALBUTEROL 0.5-2.5 (3) MG/3ML IN SOLN
3.0000 mL | Freq: Four times a day (QID) | RESPIRATORY_TRACT | Status: DC
  Administered 2021-06-11 – 2021-06-12 (×6): 3 mL via RESPIRATORY_TRACT
  Filled 2021-06-11 (×7): qty 3

## 2021-06-11 MED ORDER — LEVOTHYROXINE SODIUM 100 MCG PO TABS: 100.0000 ug | ORAL_TABLET | Freq: Every day | ORAL | Status: DC

## 2021-06-11 MED ORDER — BISACODYL 5 MG PO TBEC: 5.0000 mg | DELAYED_RELEASE_TABLET | Freq: Every day | ORAL | Status: DC | PRN

## 2021-06-11 MED ORDER — ACETAMINOPHEN 650 MG RE SUPP: 650.0000 mg | Freq: Four times a day (QID) | RECTAL | Status: DC | PRN

## 2021-06-11 MED ORDER — FUROSEMIDE 10 MG/ML IJ SOLN
40.0000 mg | Freq: Two times a day (BID) | INTRAMUSCULAR | Status: DC
  Administered 2021-06-11 – 2021-06-12 (×3): 40 mg via INTRAVENOUS
  Filled 2021-06-11 (×3): qty 4

## 2021-06-11 MED ORDER — INSULIN GLARGINE-YFGN 100 UNIT/ML ~~LOC~~ SOLN
26.0000 [IU] | Freq: Every day | SUBCUTANEOUS | Status: DC
  Administered 2021-06-11 – 2021-06-21 (×11): 26 [IU] via SUBCUTANEOUS
  Filled 2021-06-11 (×12): qty 0.26

## 2021-06-11 MED ORDER — ISOSORBIDE MONONITRATE ER 30 MG PO TB24
30.0000 mg | ORAL_TABLET | Freq: Every day | ORAL | Status: DC
  Administered 2021-06-12 – 2021-06-26 (×15): 30 mg via ORAL
  Filled 2021-06-11 (×15): qty 1

## 2021-06-11 MED ORDER — SODIUM CHLORIDE 0.9% FLUSH
3.0000 mL | Freq: Two times a day (BID) | INTRAVENOUS | Status: DC
  Administered 2021-06-11 – 2021-06-24 (×20): 3 mL via INTRAVENOUS

## 2021-06-11 MED ORDER — MORPHINE SULFATE (PF) 2 MG/ML IV SOLN: 2.0000 mg | INTRAVENOUS | Status: DC | PRN

## 2021-06-11 MED ORDER — SIMVASTATIN 5 MG PO TABS
5.0000 mg | ORAL_TABLET | Freq: Every day | ORAL | Status: DC
  Administered 2021-06-12 – 2021-06-23 (×12): 5 mg via ORAL
  Filled 2021-06-11 (×9): qty 1

## 2021-06-11 MED ORDER — DOCUSATE SODIUM 100 MG PO CAPS
100.0000 mg | ORAL_CAPSULE | Freq: Two times a day (BID) | ORAL | Status: DC
  Administered 2021-06-11 – 2021-06-22 (×11): 100 mg via ORAL
  Filled 2021-06-11 (×24): qty 1

## 2021-06-11 MED ORDER — LEVOTHYROXINE SODIUM 100 MCG PO TABS
300.0000 ug | ORAL_TABLET | Freq: Every day | ORAL | Status: DC
  Administered 2021-06-12 – 2021-06-26 (×13): 300 ug via ORAL
  Filled 2021-06-11 (×4): qty 3
  Filled 2021-06-11: qty 6
  Filled 2021-06-11 (×9): qty 3

## 2021-06-11 MED ORDER — ONDANSETRON HCL 4 MG PO TABS: 4.0000 mg | ORAL_TABLET | Freq: Four times a day (QID) | ORAL | Status: DC | PRN

## 2021-06-11 MED ORDER — ONDANSETRON HCL 4 MG/2ML IJ SOLN: 4.0000 mg | Freq: Four times a day (QID) | INTRAMUSCULAR | Status: DC | PRN

## 2021-06-11 MED ORDER — ALBUTEROL SULFATE (2.5 MG/3ML) 0.083% IN NEBU
2.5000 mg | INHALATION_SOLUTION | RESPIRATORY_TRACT | Status: DC | PRN
  Administered 2021-06-17: 2.5 mg via RESPIRATORY_TRACT
  Filled 2021-06-11: qty 3

## 2021-06-11 MED ORDER — ACETAMINOPHEN 325 MG PO TABS
650.0000 mg | ORAL_TABLET | Freq: Four times a day (QID) | ORAL | Status: DC | PRN
  Administered 2021-06-18 – 2021-06-26 (×2): 650 mg via ORAL
  Filled 2021-06-11 (×3): qty 2

## 2021-06-11 NOTE — H&P (Signed)
History and Physical    [name] [name] KDX:[id number] DOB: 11/30/1945 DOA: 06/11/2021  PCP: Nickola Major, MD Consultants:  Domenic Polite - cardiology; Delton Coombes - oncology; Wert - pulmonology Patient coming from:  Home - lives with daughter and her husband; NOK: Daughter, Ledora Bottcher, [phone]   Chief Complaint: SOB  HPI: [name] [name] is a 75 y.o. male with medical history significant of afib; CAD s/p stent; stage 3 CKD; HTN; HLD; COPD; hypothyroidism; morbid obesity; NHL treated with XRT and chemotherapy); DM; and OSA presenting with SOB.  He was last seen in the ER on 11/2 for SOB and LE edema; this was thought to be multifactorial due to COPD and CHF and he was given steroids, antibiotics, and an inhaler and discharged with ongoing dosing of current Torsemide dose.  He reports that he has CHF and was going to see the doctor today and he noticed he was swelling up.  He was using his home O2 but was still feeling SOB.  Symptoms started yesterday and worsened today.  He wears 3-4L at home.  Some cough, nonproductive. He does not lie flat.  Occasional PND, unchanged from prior.  He sleeps in a recliner.  His weight was up about 9 pounds today, he did not check his weight yesterday.  He misses 2-3 doses a week of medications.    ED Course: Fluid overlloaded.  Went to pulm on home 4L and in 60s - placed on NRB and now stable on home 4L.  Compliant with Torsemide but increased edema and +pulm edema.  Labs better than prior.    Review of Systems: As per HPI; otherwise review of systems reviewed and negative.   Ambulatory Status:  Mostly uses an electric wheelchair, able to ambulate with a walker a bit  COVID Vaccine Status:   Complete  Past Medical History:  Diagnosis Date   Atrial fibrillation ([place])    CAD (coronary artery disease)    DES to LAD June 2018   CKD (chronic kidney disease) stage 3, GFR 30-59 ml/min (HCC)    Essential hypertension    History of pneumonia     Hyperlipidemia    Hypothyroidism    LBBB (left bundle branch block)    Morbid obesity ([place])    Non Hodgkin's lymphoma ([place])    Status post XRT and chemotherapy   NSTEMI (non-ST elevated myocardial infarction) [facility])    June 2018   Peripheral neuropathy    Pneumonia due to COVID-19 virus    Sleep apnea    Type 2 diabetes mellitus ([place])     Past Surgical History:  Procedure Laterality Date   CHOLECYSTECTOMY  1992   COLONOSCOPY N/A 09/03/2014   SLF:six colon polyps removed/small internal hemorrhoids   CORONARY STENT INTERVENTION N/A 01/21/2017   Procedure: Coronary Stent Intervention;  Surgeon: Nelva Bush, MD;  Location: [place] [facility];  Service: Cardiovascular;  Laterality: N/A;   ESOPHAGOGASTRODUODENOSCOPY N/A 09/03/2014   SLF: mild gastritis/few gastric polyps   LEFT HEART CATH AND CORONARY ANGIOGRAPHY N/A 01/20/2017   Procedure: Left Heart Cath and Coronary Angiography;  Surgeon: Jettie Booze, MD;  Location: [place] [facility];  Service: Cardiovascular;  Laterality: N/A;   TOTAL KNEE ARTHROPLASTY  11/10/2011   Procedure: TOTAL KNEE ARTHROPLASTY;  Surgeon: Mauri Pole, MD;  Location: [facility];  Service: Orthopedics;  Laterality: Right;   TOTAL KNEE ARTHROPLASTY Left 05/24/2018   Procedure: LEFT TOTAL KNEE ARTHROPLASTY;  Surgeon: Melrose Nakayama, MD;  Location: [place];  Service: Orthopedics;  Laterality: Left;    Social History   Socioeconomic History   Marital status: Divorced    Spouse name: Not on file   Number of children: Not on file   Years of education: Not on file   Highest education level: Not on file  Occupational History   Not on file  Tobacco Use   Smoking status: Former    Packs/day: 0.25    Years: 30.00    Pack years: 7.50    Types: Cigarettes    Quit date: 10/2019    Years since quitting: 1.6   Smokeless tobacco: Never  Vaping Use   Vaping Use: Never used  Substance and Sexual Activity   Alcohol use: No    Alcohol/week: 0.0 standard drinks    Drug use: No   Sexual activity: Yes  Other Topics Concern   Not on file  Social History Narrative   Not on file   Social Determinants of Health   Financial Resource Strain: Not on file  Food Insecurity: Not on file  Transportation Needs: Unmet Transportation Needs   Lack of Transportation (Medical): Yes   Lack of Transportation (Non-Medical): Yes  Physical Activity: Not on file  Stress: Not on file  Social Connections: Not on file  Intimate Partner Violence: Not At Risk   Fear of Current or Ex-Partner: No   Emotionally Abused: No   Physically Abused: No   Sexually Abused: No    No Known Allergies  Family History  Problem Relation Age of Onset   Cancer Mother        breast and lung   Cancer Father        bladder   Cancer Maternal Uncle        prostate   Cancer Paternal Uncle        esophagus   Colon cancer Neg Hx     Prior to Admission medications   Medication Sig Start Date End Date Taking? Authorizing Provider  acetaminophen (TYLENOL) 325 MG tablet Take 2 tablets (650 mg total) by mouth every 6 (six) hours as needed for mild pain (or Fever >/= 101). 02/01/20   Roxan Hockey, MD  albuterol (VENTOLIN HFA) 108 (90 Base) MCG/ACT inhaler Inhale 1-2 puffs into the lungs every 6 (six) hours as needed for wheezing or shortness of breath. 01/10/19   Long, Wonda Olds, MD  apixaban (ELIQUIS) 5 MG TABS tablet Take 1 tablet (5 mg total) by mouth 2 (two) times daily. This is a dose change 01/07/21   Verta Ellen., NP  azithromycin (ZITHROMAX) 250 MG tablet Take 1 tablet (250 mg total) by mouth daily. Take first 2 tablets together, then 1 every day until finished. 06/04/21   Marcello Fennel, PA-C  bisoprolol (ZEBETA) 5 MG tablet Take 5 mg by mouth daily. 11/26/20   [provider]  chlorhexidine (PERIDEX) 0.12 % solution 15 mLs by Mouth Rinse route 2 (two) times daily. Patient not taking: No sig reported 12/31/20   Deatra James, MD  furosemide (LASIX) 40 MG  tablet Take 40 mg by mouth 2 (two) times daily. Pt ran out of Torsemide so he started taking Furosemide old rx    [provider]  insulin degludec (TRESIBA) 100 UNIT/ML FlexTouch Pen Inject 10 Units into the skin daily at 10 pm. Patient taking differently: Inject 26 Units into the skin daily at 10 pm. 01/24/21   Wynetta Emery, Clanford L, MD  ipratropium-albuterol (DUONEB) 0.5-2.5 (3) MG/3ML SOLN Inhale 3  mLs into the lungs every 4 (four) hours as needed (shortness of breath). 05/22/20   [provider]  isosorbide mononitrate (IMDUR) 30 MG 24 hr tablet Take 1 tablet (30 mg total) by mouth daily. 12/04/20   Satira Sark, MD  levocetirizine (XYZAL) 5 MG tablet Take 5 mg by mouth every evening.  08/27/17   [provider]  levothyroxine (SYNTHROID) 100 MCG tablet Take 100 mcg by mouth daily before breakfast. (Takes with 200 mcg tab for a total of 300 mcg once daily)    [provider]  levothyroxine (SYNTHROID, LEVOTHROID) 200 MCG tablet Take 300 mcg by mouth daily before breakfast. (takes with 186mcg tab for a total of 312mcg)    [provider]  nitroGLYCERIN (NITROSTAT) 0.4 MG SL tablet Place 0.4 mg under the tongue every 5 (five) minutes as needed for chest pain.  12/27/17   [provider]  OXYGEN Inhale 3-4 L into the lungs daily as needed (low oxygen).    [provider]  potassium chloride SA (KLOR-CON) 10 MEQ tablet Take 1 tablet (10 mEq total) by mouth daily. Patient not taking: Reported on 06/04/2021 01/24/21   Murlean Iba, MD  senna-docusate (SENOKOT-S) 8.6-50 MG tablet Take 1 tablet by mouth daily as needed for mild constipation. 02/01/20   [provider]  simvastatin (ZOCOR) 5 MG tablet Take 5 mg by mouth daily. 09/14/16   [provider]  sodium chloride (OCEAN) 0.65 % SOLN nasal spray Place 1 spray into both nostrils as needed for congestion.    [provider]  SYMBICORT 160-4.5 MCG/ACT inhaler  Inhale 2 puffs into the lungs daily as needed (shortness of breath). 12/22/19   [provider]  torsemide 40 MG TABS Take 40 mg by mouth 2 (two) times daily. Patient not taking: No sig reported 02/07/21 06/04/21  Manuella Ghazi, Pratik D, DO  insulin aspart (NOVOLOG) 100 UNIT/ML injection Inject 0-15 Units into the skin 3 (three) times daily with meals. Patient not taking: No sig reported 12/31/20 01/22/21  Deatra James, MD    Physical Exam: Vitals:   06/11/21 1400 06/11/21 1430 06/11/21 1600 06/11/21 1738  BP: 114/65 124/78 132/84 133/86  Pulse: 72 73 68   Resp: 16 16 18 16   Temp:    (!) 97.5 F (36.4 C)  TempSrc:    Oral  SpO2: 93% 95% 93% (!) 88%     General:  Appears calm and comfortable and is in NAD Eyes:  PERRL, EOMI, normal lids, iris ENT:  grossly normal hearing, lips & tongue, mmm; suboptimal/missing dentition Neck:  no LAD, masses or thyromegaly Cardiovascular:  RRR, no m/r/g. Taut 2-3+ LE edema.  Respiratory:   Poor air movement diffusely with scattered wheezes, no apparent crackles.  Normal respiratory effort. Abdomen:  soft, NT, ND Skin:  no rash or induration seen on limited exam Musculoskeletal:  grossly normal tone BUE/BLE, good ROM, no bony abnormality Psychiatric:  grossly normal mood and affect, speech fluent and appropriate, AOx3 Neurologic:  CN 2-12 grossly intact, moves all extremities in coordinated fashion    Radiological Exams on Admission: Independently reviewed - see discussion in A/P where applicable  DG Chest Portable 1 View  Result Date: 06/11/2021 CLINICAL DATA:  Shortness of breath. EXAM: PORTABLE CHEST 1 VIEW COMPARISON:  06/04/2021 FINDINGS: 1344 hours. Low volume lordotic film. The cardio pericardial silhouette is enlarged. Diffuse interstitial opacity again noted. Bibasilar atelectasis/infiltrate again noted with tiny bilateral pleural effusions. The visualized bony structures of the  thorax show no acute abnormality. Telemetry leads overlie  the chest. IMPRESSION: Bibasilar atelectasis/infiltrate with tiny bilateral pleural effusions. Probable component of interstitial edema. Electronically Signed   By: Misty Stanley M.D.   On: 06/11/2021 13:52    EKG: Independently reviewed.  Afib with rate 72; LBBB with NSCSLT   Labs on Admission: I have personally reviewed the available labs and imaging studies at the time of the admission.  Pertinent labs:   BUN 35/Creatinine 2.48/GFR 27 - stable BNP 199.1 HS troponin 24 Unremarkable CBC COVID/flu negative   Assessment/Plan Principal Problem:   Acute on chronic diastolic CHF (congestive heart failure) (HCC) Active Problems:   Chronic kidney disease Stage IV   Morbid obesity (HCC)   Type 2 diabetes mellitus with stage 4 chronic kidney disease (HCC)   Hypothyroidism   Benign essential hypertension   AF (paroxysmal atrial fibrillation) (HCC)   Chronic respiratory failure with hypoxia and hypercapnia (HCC)   Acute on chronic diastolic CHF -Patient with known h/o chronic diastolic CHF presenting with worsening SOB and LE edema -He reportedly had respiratory failure with EMS and was placed on NRB but is now back on baseline O2 -He acknowledges some medication noncompliance -CXR consistent with mild pulmonary edema -BNP is actually better than prior -Physical exam is more suggestive of COPD so possibly a mixed presentation -Will place in observation status with telemetry, as there are no current findings necessitating admission (hemodynamic instability, severe electrolyte abnormalities, cardiac arrhythmias, ACS, severe pulmonary edema requiring new O2 therapy, AMS) -CHF order set utilized -Was given Lasix 40 mg x 1 in ER and will repeat with 40 mg IV BID -Continue [place] O2 for now -Stable kidney function at this time, will follow  COPD with possible exacerbation -He also has known underlying COPD, on 3-4L home O2 -On exam, he was tight with poor air movement and scattered  wheezing -Suspect COPD is contributing -No cardinal symptoms other than SOB so will hold abx but will treat with prednisone -He uses Duonebs prn and also Symbicort prn; recommend using both of these standing with prn Albuterol  Possible OSA -He also reports that he was referred for a sleep study but has not been able to schedule this yet -His "best sleep ever" was during a prior hospitalization with CPAP and he is requesting this again -Will order AutoPAP -He has been notified that an outpatient sleep study will have to be done in order for him to have CPAP outside of the hospital  Chronic afib -Rate controlled with bisoprolol -Continue Eliquis  HTN -Continue bisoprolol -Will also add prn hydralazine  HLD -Continue Zocor  DM -Last A1c was 5.8, indicating good control -Continue Tresiba -Will cover with moderate-scale SSI for now  CAD -s/p stent -Continue Imdur  Hypothyroidism -Continue Synthroid at current dose  Morbid obesity -BMI is 41.4  -Weight loss should be encouraged -Outpatient PCP/bariatric medicine f/u encouraged   NHL  -treated with XRT and chemotherapy -He is now doing annual f/u, due again in 09/2021  Stage 4 CKD -Patient with chronic but stable-appearing advanced renal impairment -Need to avoid nephrotoxic exposure when possible    Note: This patient has been tested and is pending for the novel coronavirus COVID-19.   Level of care: Telemetry Cardiac  DVT prophylaxis: Eliquis Code Status:  Full - confirmed with patient Family Communication: None present Disposition Plan:  The patient is from: home  Anticipated d/c is to: home, possibly with [facility] services  Anticipated d/c date will depend on  clinical response to treatment, possibly as early as tomorrow if he has excellent response  Patient is currently: acutely ill Consults called: CHF navigation team; [facility] team; Nutrition; PT/OT Admission status: It is my clinical opinion that referral for  OBSERVATION is reasonable and necessary in this patient based on the above information provided. The aforementioned taken together are felt to place the patient at high risk for further clinical deterioration. However it is anticipated that the patient may be medically stable for discharge from the hospital within 24 to 48 hours.    Karmen Bongo MD [facility]   How to contact the [facility] Attending or Consulting provider [place] or covering provider during after hours [place], for this patient?  Check the care team in [facility] and look for a) attending/consulting [facility] provider listed and b) the [facility] team listed Log into [email or web address] and use [place]'s universal password to access. If you do not have the password, please contact the hospital operator. Locate the [facility] provider you are looking for under [facility] and page to a number that you can be directly reached. If you still have difficulty reaching the provider, please page the [facility] (Director on Call) for the Hospitalists listed on amion for assistance.   06/11/2021, 5:59 PM

## 2021-06-11 NOTE — Plan of Care (Signed)
Initiation of Care Plan Problem: Education: Goal: Knowledge of General Education information will improve Description: Including pain rating scale, medication(s)/side effects and non-pharmacologic comfort measures Outcome: Progressing   Problem: Health Behavior/Discharge Planning: Goal: Ability to manage health-related needs will improve Outcome: Progressing   Problem: Clinical Measurements: Goal: Ability to maintain clinical measurements within normal limits will improve Outcome: Progressing Goal: Will remain free from infection Outcome: Progressing Goal: Diagnostic test results will improve Outcome: Progressing Goal: Respiratory complications will improve Outcome: Progressing Goal: Cardiovascular complication will be avoided Outcome: Progressing   Problem: Activity: Goal: Risk for activity intolerance will decrease Outcome: Progressing   Problem: Nutrition: Goal: Adequate nutrition will be maintained Outcome: Progressing   Problem: Coping: Goal: Level of anxiety will decrease Outcome: Progressing   Problem: Elimination: Goal: Will not experience complications related to bowel motility Outcome: Progressing Goal: Will not experience complications related to urinary retention Outcome: Progressing   Problem: Pain Managment: Goal: General experience of comfort will improve Outcome: Progressing   Problem: Safety: Goal: Ability to remain free from injury will improve Outcome: Progressing   Problem: Skin Integrity: Goal: Risk for impaired skin integrity will decrease Outcome: Progressing   Problem: Education: Goal: Ability to demonstrate management of disease process will improve Outcome: Progressing Goal: Ability to verbalize understanding of medication therapies will improve Outcome: Progressing Goal: Individualized Educational Video(s) Outcome: Progressing   Problem: Activity: Goal: Capacity to carry out activities will improve Outcome: Progressing    Problem: Cardiac: Goal: Ability to achieve and maintain adequate cardiopulmonary perfusion will improve Outcome: Progressing   

## 2021-06-11 NOTE — ED Provider Notes (Signed)
[facility] EMERGENCY DEPARTMENT Provider Note   CSN: [id number] Arrival date & time: 06/11/21  1321     History Chief Complaint  Patient presents with   Shortness of Breath    [name] [name] is a 75 y.o. male with PMH COPD, atrial fibrillation on anticoagulation, CHF, HTN, type 2 diabetes who presents the emergency department for evaluation of shortness of breath.  Patient states that he awoke this morning with worsening bilateral lower extremity edema and had a routine pulmonology follow-up where he was found to be hypoxic into the mid 60s on his home 5 L of oxygen concentrator.  Patient has been compliant with his diuretics, and has been experiencing shortness of breath with exertion.  He was and transferred by EMS to the emergency department for hypoxia, suspected fluid overload and shortness of breath.  While here, the patient is saturating 99% on 10 L facemask.  No sensory muscle use or respiratory distress.   Shortness of Breath Associated symptoms: no abdominal pain, no chest pain, no cough, no ear pain, no fever, no rash, no sore throat and no vomiting       Past Medical History:  Diagnosis Date   Atrial fibrillation (HCC)    CAD (coronary artery disease)    DES to LAD June 2018   CKD (chronic kidney disease) stage 3, GFR 30-59 ml/min (HCC)    Essential hypertension    History of pneumonia    Hyperlipidemia    Hypothyroidism    LBBB (left bundle branch block)    Morbid obesity ([place])    Non Hodgkin's lymphoma ([place])    Status post XRT and chemotherapy   NSTEMI (non-ST elevated myocardial infarction) [facility])    June 2018   Peripheral neuropathy    Pneumonia due to COVID-19 virus    Sleep apnea    Type 2 diabetes mellitus ([place])     Patient Active Problem List   Diagnosis Date Noted   CKD (chronic kidney disease) stage 4, GFR 15-29 ml/min ([place]) 01/21/2021   Pleural effusion 01/20/2021   Thrombocytopenia ([place]) 01/20/2021   Elevated d-dimer 01/20/2021    Atrial fibrillation, chronic ([place]) 01/20/2021   CHF exacerbation ([place]) 12/28/2020   CHF (congestive heart failure) ([place]) 12/28/2020   Acute on chronic diastolic HF (heart failure) ([place]) 10/06/2020   Chronic respiratory failure with hypoxia and hypercapnia ([place]) 09/10/2020   Former smoker 08/10/2020   Acute on chronic diastolic CHF (congestive heart failure) ([place]) 08/05/2020   Acute respiratory failure with hypoxia ([place]) 08/04/2020   Acute exacerbation of CHF (congestive heart failure) ([place]) 07/19/2020   AF (paroxysmal atrial fibrillation) ([place]) 07/19/2020   COVID-19    Acute on chronic respiratory failure with hypoxia and hypercapnia ([place]) 06/25/2020   Aspiration pneumonia ([place]) 06/24/2020   Pneumonia due to COVID-19 virus 06/24/2020   Confusion    Small bowel obstruction ([place]) 01/29/2020   SBO (small bowel obstruction) ([place]) 01/28/2020   Primary osteoarthritis of left knee 05/24/2018   Primary localized osteoarthritis of left knee 05/19/2018   Acute on chronic systolic and diastolic heart failure, NYHA class 1 ([place]) 04/01/2017   CAD (coronary artery disease) 04/01/2017   DOE (dyspnea on exertion) 03/21/2017   HTN (hypertension) 03/21/2017   NSTEMI (non-ST elevated myocardial infarction) ([place]) 01/19/2017   NSTEMI, initial episode of care ([place]) 01/19/2017   Sepsis ([place]) 07/24/2016   Elevated troponin 07/24/2016   Fever 07/24/2016   Lactic acidosis 07/24/2016   Adjustment insomnia 05/18/2016   Erectile dysfunction  12/19/2015   Sleep apnea 09/30/2015   Osteoarthritis 09/30/2015   Hypothyroidism 09/30/2015   Hypogonadism in male 09/30/2015   Diabetic neuropathy ([place]) 09/30/2015   Chronic pain of left knee 09/30/2015   Benign essential hypertension 09/30/2015   Acute on chronic renal failure ([place]) 05/14/2015   Diarrhea 05/14/2015   Dehydration 05/14/2015   Hypokalemia 05/14/2015   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   Pelvic mass in male    Varices, esophageal (HCC)     Colonic mass    Abnormal CT scan, pelvis    Bladder mass    Elevated liver enzymes    Elevated LFTs    Abdominal pain 07/23/2014   Chronic kidney disease Stage IV 07/23/2014   Morbid obesity ([place]) 07/23/2014   Type 2 diabetes mellitus with stage 4 chronic kidney disease ([place]) 07/23/2014   Hypertension 07/23/2014   S/P total knee replacement, 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    Past Surgical History:  Procedure Laterality Date   CHOLECYSTECTOMY  1992   COLONOSCOPY N/A 09/03/2014   SLF:six colon polyps removed/small internal hemorrhoids   CORONARY STENT INTERVENTION N/A 01/21/2017   Procedure: Coronary Stent Intervention;  Surgeon: Nelva Bush, MD;  Location: [facility];  Service: Cardiovascular;  Laterality: N/A;   ESOPHAGOGASTRODUODENOSCOPY N/A 09/03/2014   SLF: mild gastritis/few gastric polyps   LEFT HEART CATH AND CORONARY ANGIOGRAPHY N/A 01/20/2017   Procedure: Left Heart Cath and Coronary Angiography;  Surgeon: Jettie Booze, MD;  Location: [facility];  Service: Cardiovascular;  Laterality: N/A;   TOTAL KNEE ARTHROPLASTY  11/10/2011   Procedure: TOTAL KNEE ARTHROPLASTY;  Surgeon: Mauri Pole, MD;  Location: [facility];  Service: Orthopedics;  Laterality: Right;   TOTAL KNEE ARTHROPLASTY Left 05/24/2018   Procedure: LEFT TOTAL KNEE ARTHROPLASTY;  Surgeon: Melrose Nakayama, MD;  Location: [facility];  Service: Orthopedics;  Laterality: Left;       Family History  Problem Relation Age of Onset   Cancer Mother        breast and lung   Cancer Father        bladder   Cancer Maternal Uncle        prostate   Cancer Paternal Uncle        esophagus   Colon cancer Neg Hx     Social History   Tobacco Use   Smoking status: Former    Packs/day: 0.25    Years: 30.00    Pack years: 7.50    Types: Cigarettes    Quit date: 10/2019    Years since quitting: 1.6   Smokeless tobacco: Never  Vaping Use   Vaping Use: Never used  Substance Use Topics   Alcohol use: No     Alcohol/week: 0.0 standard drinks   Drug use: No    Home Medications Prior to Admission medications   Medication Sig Start Date End Date Taking? Authorizing Provider  acetaminophen (TYLENOL) 325 MG tablet Take 2 tablets (650 mg total) by mouth every 6 (six) hours as needed for mild pain (or Fever >/= 101). 02/01/20   Roxan Hockey, MD  albuterol (VENTOLIN HFA) 108 (90 Base) MCG/ACT inhaler Inhale 1-2 puffs into the lungs every 6 (six) hours as needed for wheezing or shortness of breath. 01/10/19   Long, Wonda Olds, MD  apixaban (ELIQUIS) 5 MG TABS tablet Take 1 tablet (5 mg total) by mouth 2 (two) times daily. This is a dose change 01/07/21   Verta Ellen., NP  azithromycin [facility]) 250  MG tablet Take 1 tablet (250 mg total) by mouth daily. Take first 2 tablets together, then 1 every day until finished. 06/04/21   Marcello Fennel, PA-C  bisoprolol (ZEBETA) 5 MG tablet Take 5 mg by mouth daily. 11/26/20   [provider]  chlorhexidine (PERIDEX) 0.12 % solution 15 mLs by Mouth Rinse route 2 (two) times daily. Patient not taking: No sig reported 12/31/20   Deatra James, MD  furosemide (LASIX) 40 MG tablet Take 40 mg by mouth 2 (two) times daily. Pt ran out of Torsemide so he started taking Furosemide old rx    [provider]  insulin degludec (TRESIBA) 100 UNIT/ML FlexTouch Pen Inject 10 Units into the skin daily at 10 pm. Patient taking differently: Inject 26 Units into the skin daily at 10 pm. 01/24/21   Johnson, Clanford L, MD  ipratropium-albuterol (DUONEB) 0.5-2.5 (3) MG/3ML SOLN Inhale 3 mLs into the lungs every 4 (four) hours as needed (shortness of breath). 05/22/20   [provider]  isosorbide mononitrate (IMDUR) 30 MG 24 hr tablet Take 1 tablet (30 mg total) by mouth daily. 12/04/20   Satira Sark, MD  levocetirizine (XYZAL) 5 MG tablet Take 5 mg by mouth every evening.  08/27/17   [provider]  levothyroxine (SYNTHROID) 100 MCG tablet  Take 100 mcg by mouth daily before breakfast. (Takes with 200 mcg tab for a total of 300 mcg once daily)    [provider]  levothyroxine (SYNTHROID, LEVOTHROID) 200 MCG tablet Take 300 mcg by mouth daily before breakfast. (takes with 115mcg tab for a total of 32mcg)    [provider]  nitroGLYCERIN (NITROSTAT) 0.4 MG SL tablet Place 0.4 mg under the tongue every 5 (five) minutes as needed for chest pain.  12/27/17   [provider]  OXYGEN Inhale 3-4 L into the lungs daily as needed (low oxygen).    [provider]  potassium chloride SA (KLOR-CON) 10 MEQ tablet Take 1 tablet (10 mEq total) by mouth daily. Patient not taking: Reported on 06/04/2021 01/24/21   Murlean Iba, MD  senna-docusate (SENOKOT-S) 8.6-50 MG tablet Take 1 tablet by mouth daily as needed for mild constipation. 02/01/20   [provider]  simvastatin (ZOCOR) 5 MG tablet Take 5 mg by mouth daily. 09/14/16   [provider]  sodium chloride (OCEAN) 0.65 % SOLN nasal spray Place 1 spray into both nostrils as needed for congestion.    [provider]  SYMBICORT 160-4.5 MCG/ACT inhaler Inhale 2 puffs into the lungs daily as needed (shortness of breath). 12/22/19   [provider]  torsemide 40 MG TABS Take 40 mg by mouth 2 (two) times daily. Patient not taking: No sig reported 02/07/21 06/04/21  Manuella Ghazi, Pratik D, DO  insulin aspart (NOVOLOG) 100 UNIT/ML injection Inject 0-15 Units into the skin 3 (three) times daily with meals. Patient not taking: No sig reported 12/31/20 01/22/21  Deatra James, MD    Allergies    Patient has no known allergies.  Review of Systems   Review of Systems  Constitutional:  Negative for chills and fever.  HENT:  Negative for ear pain and sore throat.   Eyes:  Negative for pain and visual disturbance.  Respiratory:  Positive for shortness of breath. Negative for cough.   Cardiovascular:  Positive for leg swelling. Negative for  chest pain and palpitations.  Gastrointestinal:  Negative for abdominal pain and vomiting.  Genitourinary:  Negative for dysuria  and hematuria.  Musculoskeletal:  Negative for arthralgias and back pain.  Skin:  Negative for color change and rash.  Neurological:  Negative for seizures and syncope.  All other systems reviewed and are negative.  Physical Exam Updated Vital Signs There were no vitals taken for this visit.  Physical Exam Vitals and nursing note reviewed.  Constitutional:      Appearance: He is well-developed.  HENT:     Head: Normocephalic and atraumatic.  Eyes:     Conjunctiva/sclera: Conjunctivae normal.  Cardiovascular:     Rate and Rhythm: Normal rate and regular rhythm.     Heart sounds: No murmur heard. Pulmonary:     Effort: Pulmonary effort is normal. No respiratory distress.     Breath sounds: Rales present.  Abdominal:     Palpations: Abdomen is soft.     Tenderness: There is no abdominal tenderness.  Musculoskeletal:     Cervical back: Neck supple.     Right lower leg: Edema present.     Left lower leg: Edema present.  Skin:    General: Skin is warm and dry.  Neurological:     Mental Status: He is alert.    ED Results / Procedures / Treatments   Labs (all labs ordered are listed, but only abnormal results are displayed) Labs Reviewed  COMPREHENSIVE METABOLIC PANEL  CBC WITH DIFFERENTIAL/PLATELET  BRAIN NATRIURETIC PEPTIDE  TROPONIN I (HIGH SENSITIVITY)    EKG None  Radiology DG Chest Portable 1 View  Result Date: 06/11/2021 CLINICAL DATA:  Shortness of breath. EXAM: PORTABLE CHEST 1 VIEW COMPARISON:  06/04/2021 FINDINGS: 1344 hours. Low volume lordotic film. The cardio pericardial silhouette is enlarged. Diffuse interstitial opacity again noted. Bibasilar atelectasis/infiltrate again noted with tiny bilateral pleural effusions. The visualized bony structures of the thorax show no acute abnormality. Telemetry leads overlie the chest.  IMPRESSION: Bibasilar atelectasis/infiltrate with tiny bilateral pleural effusions. Probable component of interstitial edema. Electronically Signed   By: Misty [place] M.D.   On: 06/11/2021 13:52    Procedures Procedures   Medications Ordered in ED Medications - No data to display  ED Course  I have reviewed the triage vital signs and the nursing notes.  Pertinent labs & imaging results that were available during my care of the patient were reviewed by me and considered in my medical decision making (see chart for details).    MDM Rules/Calculators/A&P                           Patient seen emergency department for evaluation of shortness of breath and suspected fluid overload.  Physical exam reveals rales at bilateral bases with bilateral lower extremity pitting edema.  Patient maintained an oxygen saturation of 94% on his home 4 L nasal cannula.  Laboratory evaluation with a creatinine of 2.48, BUN 35 which is baseline for this patient.  CBC unremarkable.  High-sensitivity troponin 24 which is down trended from troponins ordered 4 months ago but we will order a delta troponin and follow-up.  X-ray with evidence of likely pulmonary edema and in this patient's clinical picture, this fits well.  Patient will require admission for diuresis and shortness of breath.  Patient then admitted. Final Clinical Impression(s) / ED Diagnoses Final diagnoses:  None    Rx / DC Orders ED Discharge Orders     None        Tyeesha Riker, Debe Coder, MD 06/11/21 1448

## 2021-06-11 NOTE — Assessment & Plan Note (Addendum)
-   Patient presents today for ED follow-up. He was noted to have increased oxygen demand today in office require 6L oxygen to maintain O2 >88-90%. Reports shortness of breath, leg swelling and weight gain. He has no other respiratory complaints. He is compliant with Torsemide. Symptoms are most consistent with acute CHF exacerbation. He does not have a diagnosis of COPD. Recommended ED evaluation vis EMS, likely needs admission for IV diuresis.

## 2021-06-11 NOTE — Progress Notes (Signed)
@Patient  ID: [name] [name], male    DOB: [birth year], 75 y.o.   MRN: [id number]  No chief complaint on file.   Referring provider: Nickola Major, MD  HPI: 75 year old male, former smoker quit in March 2021 7.5-pack-year history).  Past medical history significant for COPD, acute on chronic respiratory failure, COVID-pneumonia, A. fib, congestive heart failure, hypertension, NSTEMI, 2 diabetes.  Patient of Dr. [name], last seen in office on 09/10/2020.   06/11/2021- Interim hx  Pateint seen today for ED follow-up. Accompanied by family member. O2 on arrival was 66-77% on 5L pulsed oxygen. We placed him on 4L continuous O2 and his saturation came up to 84% so we increased oxygen to 6L and he was able to maintain O2 saturation between 92-95%. He is normally on 3-4L oxygen at home and easily desaturated into 60-70s with any exerrtion. He has associated weight gain and leg swelling. He is compliant with torsemide 80mg  BID.   No Known Allergies  Immunization History  Administered Date(s) Administered   Fluad Quad(high Dose 65+) 08/09/2020   Influenza-Unspecified 05/17/2014, 05/18/2015   Moderna SARS-COV2 Booster Vaccination 02/06/2021   Pneumococcal-Unspecified 05/17/2014    Past Medical History:  Diagnosis Date   Atrial fibrillation ([place])    CAD (coronary artery disease)    DES to LAD June 2018   CKD (chronic kidney disease) stage 3, GFR 30-59 ml/min (HCC)    Essential hypertension    History of pneumonia    Hyperlipidemia    Hypothyroidism    LBBB (left bundle branch block)    Morbid obesity ([place])    Non Hodgkin's lymphoma ([place])    Status post XRT and chemotherapy   NSTEMI (non-ST elevated myocardial infarction) ([place])    June 2018   Peripheral neuropathy    Pneumonia due to COVID-19 virus    Sleep apnea    Type 2 diabetes mellitus (HCC)     Tobacco History: Social History   Tobacco Use  Smoking Status Former   Packs/day: 0.25   Years: 30.00   Pack years: 7.50    Types: Cigarettes   Quit date: 10/2019   Years since quitting: 1.6  Smokeless Tobacco Never   Counseling given: Not Answered   Outpatient Medications Prior to Visit  Medication Sig Dispense Refill   acetaminophen (TYLENOL) 325 MG tablet Take 2 tablets (650 mg total) by mouth every 6 (six) hours as needed for mild pain (or Fever >/= 101). 30 tablet 1   albuterol (VENTOLIN HFA) 108 (90 Base) MCG/ACT inhaler Inhale 1-2 puffs into the lungs every 6 (six) hours as needed for wheezing or shortness of breath. 1 Inhaler 0   apixaban (ELIQUIS) 5 MG TABS tablet Take 1 tablet (5 mg total) by mouth 2 (two) times daily. This is a dose change 60 tablet 6   azithromycin (ZITHROMAX) 250 MG tablet Take 1 tablet (250 mg total) by mouth daily. Take first 2 tablets together, then 1 every day until finished. 6 tablet 0   bisoprolol (ZEBETA) 5 MG tablet Take 5 mg by mouth daily.     chlorhexidine (PERIDEX) 0.12 % solution 15 mLs by Mouth Rinse route 2 (two) times daily. (Patient not taking: No sig reported) 120 mL 0   furosemide (LASIX) 40 MG tablet Take 40 mg by mouth 2 (two) times daily. Pt ran out of Torsemide so he started taking Furosemide old rx     insulin degludec (TRESIBA) 100 UNIT/ML FlexTouch Pen Inject 10 Units into the skin daily at  10 pm. (Patient taking differently: Inject 26 Units into the skin daily at 10 pm.)     ipratropium-albuterol (DUONEB) 0.5-2.5 (3) MG/3ML SOLN Inhale 3 mLs into the lungs every 4 (four) hours as needed (shortness of breath).     isosorbide mononitrate (IMDUR) 30 MG 24 hr tablet Take 1 tablet (30 mg total) by mouth daily. 30 tablet 6   levocetirizine (XYZAL) 5 MG tablet Take 5 mg by mouth every evening.      levothyroxine (SYNTHROID) 100 MCG tablet Take 100 mcg by mouth daily before breakfast. (Takes with 200 mcg tab for a total of 300 mcg once daily)     levothyroxine (SYNTHROID, LEVOTHROID) 200 MCG tablet Take 300 mcg by mouth daily before breakfast. (takes with 154mcg tab  for a total of 349mcg)     nitroGLYCERIN (NITROSTAT) 0.4 MG SL tablet Place 0.4 mg under the tongue every 5 (five) minutes as needed for chest pain.   11   OXYGEN Inhale 3-4 L into the lungs daily as needed (low oxygen).     potassium chloride SA (KLOR-CON) 10 MEQ tablet Take 1 tablet (10 mEq total) by mouth daily. (Patient not taking: Reported on 06/04/2021) 30 tablet 0   senna-docusate (SENOKOT-S) 8.6-50 MG tablet Take 1 tablet by mouth daily as needed for mild constipation.     simvastatin (ZOCOR) 5 MG tablet Take 5 mg by mouth daily.     sodium chloride (OCEAN) 0.65 % SOLN nasal spray Place 1 spray into both nostrils as needed for congestion.     SYMBICORT 160-4.5 MCG/ACT inhaler Inhale 2 puffs into the lungs daily as needed (shortness of breath).     torsemide 40 MG TABS Take 40 mg by mouth 2 (two) times daily. (Patient not taking: No sig reported) 60 tablet 3   No facility-administered medications prior to visit.      Review of Systems  Review of Systems  Constitutional: Negative.   HENT: Negative.    Respiratory:  Positive for shortness of breath. Negative for cough and chest tightness.        Intermittent wheeze  Cardiovascular:  Positive for leg swelling.    Physical Exam  There were no vitals taken for this visit. Physical Exam Constitutional:      Appearance: Normal appearance. He is obese.  Cardiovascular:     Rate and Rhythm: Normal rate.     Comments: +3-4 BLE edema Pulmonary:     Effort: Pulmonary effort is normal.     Breath sounds: No wheezing, rhonchi or rales.     Comments: O2 77% 5L POC Musculoskeletal:     Comments: In WC  Neurological:     General: No focal deficit present.     Mental Status: He is alert and oriented to person, place, and time. Mental status is at baseline.  Psychiatric:        Mood and Affect: Mood normal.        Behavior: Behavior normal.        Thought Content: Thought content normal.        Judgment: Judgment normal.     Lab  Results:  CBC    Component Value Date/Time   WBC 8.1 06/04/2021 1820   RBC 5.34 06/04/2021 1820   HGB 15.0 06/04/2021 1820   HCT 48.1 06/04/2021 1820   PLT 136 (L) 06/04/2021 1820   MCV 90.1 06/04/2021 1820   MCH 28.1 06/04/2021 1820   MCHC 31.2 06/04/2021 1820   RDW 15.8 (H)  06/04/2021 1820   LYMPHSABS 1.1 06/04/2021 1820   MONOABS 0.6 06/04/2021 1820   EOSABS 0.1 06/04/2021 1820   BASOSABS 0.1 06/04/2021 1820    BMET    Component Value Date/Time   NA 140 06/04/2021 1820   K 3.5 06/04/2021 1820   CL 103 06/04/2021 1820   CO2 28 06/04/2021 1820   GLUCOSE 97 06/04/2021 1820   BUN 36 (H) 06/04/2021 1820   CREATININE 2.38 (H) 06/04/2021 1820   CALCIUM 9.5 06/04/2021 1820   GFRNONAA 28 (L) 06/04/2021 1820   GFRAA 30 (L) 01/31/2020 0500    BNP    Component Value Date/Time   BNP 446.0 (H) 06/04/2021 1820    ProBNP    Component Value Date/Time   PROBNP 299.5 (H) 07/23/2014 1245    Imaging: DG Chest Port 1 View  Result Date: 06/04/2021 CLINICAL DATA:  Short of breath EXAM: PORTABLE CHEST 1 VIEW COMPARISON:  01/30/2021 FINDINGS: Cardiac enlargement. Pulmonary vascularity with mild interstitial edema. Small bilateral effusions and bibasilar atelectasis. IMPRESSION: Congestive heart failure with mild interstitial edema and small bilateral effusions. Bibasilar atelectasis. Electronically Signed   By: Franchot Gallo M.D.   On: 06/04/2021 18:51     Assessment & Plan:   CHF exacerbation [facility]) - Patient presents today for ED follow-up. He was noted to have increased oxygen demand today in office require 6L oxygen to maintain O2 >88-90%. Reports shortness of breath, leg swelling and weight gain. He has no other respiratory complaints. He is compliant with Torsemide. Symptoms are most consistent with acute CHF exacerbation. He does not have a diagnosis of COPD. Recommended ED evaluation vis EMS, likely needs admission for IV diuresis.    Martyn Ehrich, NP 06/11/2021

## 2021-06-11 NOTE — ED Triage Notes (Signed)
Patient BIB [facility] from [facility] pulmonology for evaluation of shortness of breath. Was placed on 4L O2 [place] and SpO2 88%, placed on 10L simple mask and shortness of breath resolved and SpO2 increased to 98%. Patient alert, oriented, and in no apparent distress at this time.

## 2021-06-11 NOTE — Progress Notes (Deleted)
Pateint seen today for ED follow-up. He is no better. O2 66-77% on 5L pulse oxygen. We placed him on continous O2 and his saturation came up to 84% 4L so we increased oxygen to 6L and he is maintaining 92-95%. He is normally on 3-4L oxygena t home and easily desaturated into 60-70s with any exerrtion.  He has associated weight gain and leg swelling. He is compliant with his diuretics, taking torsemide 80mg  BID.

## 2021-06-12 ENCOUNTER — Encounter (HOSPITAL_COMMUNITY): Payer: Self-pay | Admitting: Internal Medicine

## 2021-06-12 DIAGNOSIS — E1142 Type 2 diabetes mellitus with diabetic polyneuropathy: Secondary | ICD-10-CM | POA: Diagnosis present

## 2021-06-12 DIAGNOSIS — I2510 Atherosclerotic heart disease of native coronary artery without angina pectoris: Secondary | ICD-10-CM | POA: Diagnosis present

## 2021-06-12 DIAGNOSIS — E785 Hyperlipidemia, unspecified: Secondary | ICD-10-CM | POA: Diagnosis present

## 2021-06-12 DIAGNOSIS — I10 Essential (primary) hypertension: Secondary | ICD-10-CM | POA: Diagnosis not present

## 2021-06-12 DIAGNOSIS — N1832 Chronic kidney disease, stage 3b: Secondary | ICD-10-CM | POA: Diagnosis not present

## 2021-06-12 DIAGNOSIS — M109 Gout, unspecified: Secondary | ICD-10-CM | POA: Diagnosis present

## 2021-06-12 DIAGNOSIS — Z6841 Body Mass Index (BMI) 40.0 and over, adult: Secondary | ICD-10-CM | POA: Diagnosis not present

## 2021-06-12 DIAGNOSIS — R0602 Shortness of breath: Secondary | ICD-10-CM | POA: Diagnosis present

## 2021-06-12 DIAGNOSIS — I480 Paroxysmal atrial fibrillation: Secondary | ICD-10-CM | POA: Diagnosis not present

## 2021-06-12 DIAGNOSIS — J9622 Acute and chronic respiratory failure with hypercapnia: Secondary | ICD-10-CM | POA: Diagnosis present

## 2021-06-12 DIAGNOSIS — N179 Acute kidney failure, unspecified: Secondary | ICD-10-CM | POA: Diagnosis not present

## 2021-06-12 DIAGNOSIS — I5033 Acute on chronic diastolic (congestive) heart failure: Secondary | ICD-10-CM | POA: Diagnosis not present

## 2021-06-12 DIAGNOSIS — L309 Dermatitis, unspecified: Secondary | ICD-10-CM | POA: Diagnosis present

## 2021-06-12 DIAGNOSIS — I5043 Acute on chronic combined systolic (congestive) and diastolic (congestive) heart failure: Secondary | ICD-10-CM | POA: Diagnosis present

## 2021-06-12 DIAGNOSIS — E662 Morbid (severe) obesity with alveolar hypoventilation: Secondary | ICD-10-CM | POA: Diagnosis present

## 2021-06-12 DIAGNOSIS — I130 Hypertensive heart and chronic kidney disease with heart failure and stage 1 through stage 4 chronic kidney disease, or unspecified chronic kidney disease: Secondary | ICD-10-CM | POA: Diagnosis present

## 2021-06-12 DIAGNOSIS — I4821 Permanent atrial fibrillation: Secondary | ICD-10-CM | POA: Diagnosis present

## 2021-06-12 DIAGNOSIS — I272 Pulmonary hypertension, unspecified: Secondary | ICD-10-CM | POA: Diagnosis present

## 2021-06-12 DIAGNOSIS — Z8616 Personal history of COVID-19: Secondary | ICD-10-CM | POA: Diagnosis not present

## 2021-06-12 DIAGNOSIS — E1122 Type 2 diabetes mellitus with diabetic chronic kidney disease: Secondary | ICD-10-CM | POA: Diagnosis present

## 2021-06-12 DIAGNOSIS — J449 Chronic obstructive pulmonary disease, unspecified: Secondary | ICD-10-CM | POA: Diagnosis present

## 2021-06-12 DIAGNOSIS — Z809 Family history of malignant neoplasm, unspecified: Secondary | ICD-10-CM | POA: Diagnosis not present

## 2021-06-12 DIAGNOSIS — Z8571 Personal history of Hodgkin lymphoma: Secondary | ICD-10-CM | POA: Diagnosis not present

## 2021-06-12 DIAGNOSIS — E039 Hypothyroidism, unspecified: Secondary | ICD-10-CM | POA: Diagnosis present

## 2021-06-12 DIAGNOSIS — Z20822 Contact with and (suspected) exposure to covid-19: Secondary | ICD-10-CM | POA: Diagnosis present

## 2021-06-12 DIAGNOSIS — I509 Heart failure, unspecified: Secondary | ICD-10-CM | POA: Diagnosis not present

## 2021-06-12 DIAGNOSIS — I4891 Unspecified atrial fibrillation: Secondary | ICD-10-CM | POA: Diagnosis not present

## 2021-06-12 DIAGNOSIS — I252 Old myocardial infarction: Secondary | ICD-10-CM | POA: Diagnosis not present

## 2021-06-12 DIAGNOSIS — J9621 Acute and chronic respiratory failure with hypoxia: Secondary | ICD-10-CM | POA: Diagnosis present

## 2021-06-12 DIAGNOSIS — I351 Nonrheumatic aortic (valve) insufficiency: Secondary | ICD-10-CM | POA: Diagnosis not present

## 2021-06-12 DIAGNOSIS — E876 Hypokalemia: Secondary | ICD-10-CM | POA: Diagnosis not present

## 2021-06-12 DIAGNOSIS — N184 Chronic kidney disease, stage 4 (severe): Secondary | ICD-10-CM | POA: Diagnosis present

## 2021-06-12 DIAGNOSIS — I5031 Acute diastolic (congestive) heart failure: Secondary | ICD-10-CM | POA: Diagnosis not present

## 2021-06-12 LAB — GLUCOSE, CAPILLARY
Glucose-Capillary: 74 mg/dL (ref 70–99)
Glucose-Capillary: 83 mg/dL (ref 70–99)
Glucose-Capillary: 126 mg/dL — ABNORMAL HIGH (ref 70–99)
Glucose-Capillary: 232 mg/dL — ABNORMAL HIGH (ref 70–99)
Glucose-Capillary: 166 mg/dL — ABNORMAL HIGH (ref 70–99)

## 2021-06-12 LAB — CBC
HCT: 46.3 % (ref 39.0–52.0)
Hemoglobin: 14.4 g/dL (ref 13.0–17.0)
MCH: 27.9 pg (ref 26.0–34.0)
MCHC: 31.1 g/dL (ref 30.0–36.0)
MCV: 89.6 fL (ref 80.0–100.0)
Platelets: 142 10*3/uL — ABNORMAL LOW (ref 150–400)
RBC: 5.17 MIL/uL (ref 4.22–5.81)
RDW: 16.3 % — ABNORMAL HIGH (ref 11.5–15.5)
WBC: 7.1 10*3/uL (ref 4.0–10.5)
nRBC: 0 % (ref 0.0–0.2)

## 2021-06-12 LAB — BASIC METABOLIC PANEL
Anion gap: 11 (ref 5–15)
BUN: 38 mg/dL — ABNORMAL HIGH (ref 8–23)
CO2: 25 mmol/L (ref 22–32)
Calcium: 8.8 mg/dL — ABNORMAL LOW (ref 8.9–10.3)
Chloride: 105 mmol/L (ref 98–111)
Creatinine, Ser: 2.56 mg/dL — ABNORMAL HIGH (ref 0.61–1.24)
GFR, Estimated: 26 mL/min — ABNORMAL LOW (ref >60)
Glucose, Bld: 71 mg/dL (ref 70–99)
Potassium: 3.5 mmol/L (ref 3.5–5.1)
Sodium: 141 mmol/L (ref 135–145)

## 2021-06-12 MED ORDER — ALUM & MAG HYDROXIDE-SIMETH 200-200-20 MG/5ML PO SUSP
30.0000 mL | Freq: Four times a day (QID) | ORAL | Status: DC | PRN
  Administered 2021-06-22: 30 mL via ORAL
  Filled 2021-06-12: qty 30

## 2021-06-12 MED ORDER — IPRATROPIUM-ALBUTEROL 0.5-2.5 (3) MG/3ML IN SOLN
3.0000 mL | Freq: Three times a day (TID) | RESPIRATORY_TRACT | Status: DC
  Administered 2021-06-13 (×3): 3 mL via RESPIRATORY_TRACT
  Filled 2021-06-12 (×3): qty 3

## 2021-06-12 MED ORDER — POTASSIUM CHLORIDE CRYS ER 20 MEQ PO TBCR
30.0000 meq | EXTENDED_RELEASE_TABLET | Freq: Once | ORAL | Status: AC
  Administered 2021-06-12: 30 meq via ORAL
  Filled 2021-06-12: qty 1

## 2021-06-12 MED ORDER — JUVEN PO PACK
1.0000 | PACK | Freq: Two times a day (BID) | ORAL | Status: DC
  Administered 2021-06-12 – 2021-06-26 (×22): 1 via ORAL
  Filled 2021-06-12 (×28): qty 1

## 2021-06-12 MED ORDER — SALINE SPRAY 0.65 % NA SOLN
1.0000 | NASAL | Status: DC | PRN
  Administered 2021-06-13: 1 via NASAL
  Filled 2021-06-12: qty 44

## 2021-06-12 NOTE — Evaluation (Signed)
Occupational Therapy Evaluation Patient Details Name: [name] [name] MRN: [id number] DOB: [birth year] Today's Date: 06/12/2021   History of Present Illness The pt is a 75 yo male presenting 11/9 with LE edema and SOB. Upon work up pt with fluid overload and increased O2 demands. PMH includes: afib, CAD, CKD III, HTN, HLD, LBBB, NSTEMI (2018), DM II, sleep apnea (sleeps in recliner), morbid obesity, and neuropathy. Uses 3-4L O2 at baseline.   Clinical Impression   PTA, pt lives with family and reports typically Modified Independent to Setup Assist for ADLs at home, mobilizes a short distance in/out of bathroom using RW and typically uses electric scooter for mobility around the house due to significant SOB with activity. Pt presents relatively close to baseline for ADLs, requiring no physical assist to complete tasks though benefits from seated rest breaks and pursed lip breathing with activity. Provided education and handouts for energy conservation strategies and strategies for CHF mgmt at home with pt verbalizing understanding of strategies. Will continue to follow acutely for an additional session to educate on UE HEP to gradually improve strength/endurance based on pt's valued goals and progress bathroom mobility within tolerance. As pt is near baseline for ADLs, do not anticipate need for OT follow-up at DC but would recommend a new shower chair as pt reports his current one is worn.  SpO2 desats to 85% on 4 L O2 with minimal activity.  HR 40s-70s with activity     Recommendations for follow up therapy are one component of a multi-disciplinary discharge planning process, led by the attending physician.  Recommendations may be updated based on patient status, additional functional criteria and insurance authorization.   Follow Up Recommendations  No OT follow up    Assistance Recommended at Discharge Set up Supervision/Assistance  Functional Status Assessment  Patient has not had a recent  decline in their functional status  Equipment Recommendations  Tub/shower seat (reports his current shower chair is worn out)    Recommendations for Other Services       Precautions / Restrictions Precautions Precautions: Fall Precaution Comments: monitor O2 (wears 3-4 L O2 at baseline) Restrictions Weight Bearing Restrictions: No      Mobility Bed Mobility Overal bed mobility: Needs Assistance             General bed mobility comments: pt OOB in recliner for session    Transfers Overall transfer level: Needs assistance Equipment used: Rolling walker (2 wheels) Transfers: Sit to/from Stand Sit to Stand: Supervision           General transfer comment: minA to complete power up as pt with poor anterior shift on one attempt, completed with minG on second attempt. Pt desats to low of 85% on RA and required 4-5 min to recover to 90s with seated rest and 4L.      Balance Overall balance assessment: Needs assistance Sitting-balance support: No upper extremity supported Sitting balance-Leahy Scale: Good Sitting balance - Comments: pt able to maintain without UE support or back support   Standing balance support: Bilateral upper extremity supported Standing balance-Leahy Scale: Poor Standing balance comment: pt dependent on BUE support on RW                           ADL either performed or assessed with clinical judgement   ADL Overall ADL's : Needs assistance/impaired Eating/Feeding: Independent;Sitting   Grooming: Set up;Sitting   Upper Body Bathing: Set up;Sitting   Lower Body  Bathing: Supervison/ safety;Sit to/from stand   Upper Body Dressing : Set up;Sitting   Lower Body Dressing: Supervision/safety;Sit to/from stand   Toilet Transfer: Min guard;Ambulation;BSC/3in1;Rolling walker (2 wheels) Toilet Transfer Details (indicate cue type and reason): short distance to/from bari [facility] in room Toileting- Clothing Manipulation and Hygiene:  Supervision/safety;Sit to/from stand       Functional mobility during ADLs: Min guard;Rolling walker (2 wheels) General ADL Comments: Pt limited by baseline SOB with activity, education on energy conservation strategies (handout provided), activity progression gradually, and CHF mgmt (provided CHF & Self care handout). Pt already implementing many techniques, aware of sedentary lifestyle though aware of need for movement and interested in increased strength/endurance     Vision Baseline Vision/History: 1 Wears glasses Ability to See in Adequate Light: 0 Adequate Patient Visual Report: No change from baseline Vision Assessment?: No apparent visual deficits     Perception     Praxis      Pertinent Vitals/Pain Pain Assessment: No/denies pain     Hand Dominance Right   Extremity/Trunk Assessment Upper Extremity Assessment Upper Extremity Assessment: Overall WFL for tasks assessed   Lower Extremity Assessment Lower Extremity Assessment: Defer to PT evaluation   Cervical / Trunk Assessment Cervical / Trunk Assessment: Kyphotic   Communication Communication Communication: No difficulties   Cognition Arousal/Alertness: Awake/alert Behavior During Therapy: WFL for tasks assessed/performed Overall Cognitive Status: Within Functional Limits for tasks assessed                                       General Comments  HR 40-70s. SpO2 desats to 85% on 4 L O2 with very short bout of mobility using RW. Seated rest break and pursed lip breathing to recover to 90 within 1.5 min    Exercises     Shoulder Instructions      Home Living Family/patient expects to be discharged to:: Private residence Living Arrangements: Children (daughter and son in law) Available Help at Discharge: Family;Available 24 hours/day;Other (Comment) (son in law works second shift, daughter does not work - disabled per pt) Type of Home: [place] Access: Ramped entrance;Stairs to enter      Home Layout: One level     Bathroom Shower/Tub: Tub/shower unit;Sponge bathes at [place]: Handicapped height ([place] over toilet)     Home Equipment: Conservation officer, nature (2 wheels);Cane - single point;Toilet riser;Wheelchair - power;Electric scooter;Hand held shower head;Shower seat   Additional Comments: pt sleeps in recliner, uses 3-4L O2 at home, has pulse ox to monitor      Prior Functioning/Environment Prior Level of Function : Independent/Modified Independent             Mobility Comments: pt reports transfering from recliner to power chair without assist or need for assist, but limited by SOB. able to walk short distances with RW - parks power chair at bathroom door and walks into bathroom to allow more space for movement ADLs Comments: Able to complete toileting, dressing and bathing tasks though takes increased time. Pt uses urinal during the day (assist to empty container), sponge bathing mostly - reports shower chair is getting worn out. brushes teeth seated in recliner at home        OT Problem List: Decreased activity tolerance;Cardiopulmonary status limiting activity      OT Treatment/Interventions: Self-care/ADL training;Therapeutic exercise;DME and/or AE instruction;Energy conservation;Therapeutic activities    OT Goals(Current goals can be  found in the care plan section) Acute Rehab OT Goals Patient Stated Goal: improve strength/endurance OT Goal Formulation: With patient Time For Goal Achievement: 06/26/21 Potential to Achieve Goals: Good  OT Frequency: Min 2X/week   Barriers to D/C:            Co-evaluation              AM-PAC OT "6 Clicks" Daily Activity     Outcome Measure Help from another person eating meals?: None Help from another person taking care of personal grooming?: A Little Help from another person toileting, which includes using toliet, bedpan, or urinal?: A Little Help from another person bathing (including  washing, rinsing, drying)?: A Little Help from another person to put on and taking off regular upper body clothing?: A Little Help from another person to put on and taking off regular lower body clothing?: A Little 6 Click Score: 19   End of Session Equipment Utilized During Treatment: Rolling walker (2 wheels);Oxygen Nurse Communication: Mobility status;Other (comment) (request for more ice)  Activity Tolerance: Patient tolerated treatment well Patient left: in chair;with call bell/phone within reach  OT Visit Diagnosis: Other (comment) (decreased cardiopulmonary tolerance)                Time: 3338-3291 OT Time Calculation (min): 31 min Charges:  OT General Charges $OT Visit: 1 Visit OT Evaluation $OT Eval Low Complexity: 1 Low OT Treatments $Therapeutic Activity: 8-22 mins  Malachy Chamber, OTR/L Acute Rehab Services Office: [phone]   Layla Maw 06/12/2021, 11:44 AM

## 2021-06-12 NOTE — Progress Notes (Signed)
Orthopedic Tech Progress Note Patient Details:  [name] [name] [birth year] [id number]  Ortho Devices Type of Ortho Device: Haematologist Ortho Device/Splint Location: BIL Ortho Device/Splint Interventions: Ordered, Application   Post Interventions Patient Tolerated: Well  [name] 06/12/2021, 12:04 PM

## 2021-06-12 NOTE — Plan of Care (Signed)
  Problem: Education: Goal: Knowledge of General Education information will improve Description Including pain rating scale, medication(s)/side effects and non-pharmacologic comfort measures Outcome: Progressing   

## 2021-06-12 NOTE — Progress Notes (Addendum)
Heart Failure Nurse Navigator Progress Note  PCP: Nickola Major, MD PCP-Cardiologist: Domenic Polite [facility]) Admission Diagnosis: A/C CHF + COPD exac Admitted from: home with daughter  Presentation:   [name] [name] presented 11/9 from Dr. Gustavus Bryant office [facility]) for increased oxygen demand. Pt stated he was wearing 5LPM at home and was having trouble breathing. Pt had recent ED visit for COPD exac and peripheral edema 06/04/21.  Pt sitting in recliner with legs elevated on 4LPM O2 via [place]. Pt legs edeamtous 2+, shiny, ruddy in color. Pt states he has primary used an electric wheelchair for the past 6 months as his ambulation status has diminished. Pt has shower chair and walker at home. Pt lives with his daughter and son-in-law, he is not obligated to pay monthly bills, but helps when he can. Pt states he quit smoking 10/2019--smoked 1PPD x 30 years. Pt states he takes all his medications as prescribed including lasix and inhalers. Pt states he still cook some, but eats whatever his daughter makes/buys. He eats out some, TV dinners, and processed meats.  Pt was scheduled for cardiology appt with Dr. Domenic Polite for tomorrow (11/11), Navigator called clinic to make aware of hospitalization. Pt previously enrolled to [employer]. Requested [employer] boots from attending.   ECHO/ LVEF: 12/2020- 55-60%  Clinical Course:  Past Medical History:  Diagnosis Date   Atrial fibrillation ([place])    CAD (coronary artery disease)    DES to LAD June 2018   CKD (chronic kidney disease) stage 3, GFR 30-59 ml/min (HCC)    Essential hypertension    History of pneumonia    Hyperlipidemia    Hypothyroidism    LBBB (left bundle branch block)    Morbid obesity ([place])    Non Hodgkin's lymphoma ([place])    Status post XRT and chemotherapy   NSTEMI (non-ST elevated myocardial infarction) [facility])    June 2018   Peripheral neuropathy    Pneumonia due to COVID-19 virus    Sleep apnea    Type 2 diabetes mellitus ([place])       Social History   Socioeconomic History   Marital status: Widowed    Spouse name: Not on file   Number of children: 1   Years of education: Not on file   Highest education level: Some college, no degree  Occupational History   Occupation: retired    Comment: truck Geophysicist/field seismologist  Tobacco Use   Smoking status: Former    Packs/day: 1.00    Years: 30.00    Pack years: 30.00    Types: Cigarettes    Quit date: 10/2019    Years since quitting: 1.6   Smokeless tobacco: Never  Vaping Use   Vaping Use: Never used  Substance and Sexual Activity   Alcohol use: No    Alcohol/week: 0.0 standard drinks   Drug use: No   Sexual activity: Yes  Other Topics Concern   Not on file  Social History Narrative   Not on file   Social Determinants of Health   Financial Resource Strain: Low Risk    Difficulty of Paying Living Expenses: Not very hard  Food Insecurity: No Food Insecurity   Worried About Charity fundraiser in the Last Year: Never true   [place] in the Last Year: Never true  Transportation Needs: No Transportation Needs   Lack of Transportation (Medical): No   Lack of Transportation (Non-Medical): No  Physical Activity: Not on file  Stress: Not on file  Social Connections: Not on file    High Risk Criteria for Readmission and/or Poor Patient Outcomes: Heart failure hospital admissions (last 6 months): 4  No Show rate: 5% Difficult social situation: no Demonstrates medication adherence: yes, per pt statement Primary Language: English Literacy level: able to read/write and comprehend. Wears reading glasses.   Education Assessment and Provision:  Detailed education and instructions provided on heart failure disease management including the following:  Signs and symptoms of Heart Failure When to call the physician Importance of daily weights Low sodium diet Fluid restriction Medication management Anticipated future follow-up appointments  Patient education  given on each of the above topics.  Patient acknowledges understanding via teach back method and acceptance of all instructions.  Education Materials:  "Living Better With Heart Failure" Booklet, HF zone tool, & Daily Weight Tracker Tool.  Patient has scale at home: yes Patient has pill box at home: yes-daughter sets up meds.   Barriers of Care:   -dietary indiscretion -multiple co-morbidities  Pt declined HV TOC clinic appt upon DC from hospitalization. Pt educated on benefits of HV TOC, pt wants to stay with Dr. Myles Gip office. Pt education complete regarding HF patient booklet.    Will continue to follow for education and changing needs in plan of care.   Pricilla Holm, MSN, RN Heart Failure Nurse Navigator [phone]

## 2021-06-12 NOTE — Evaluation (Signed)
Physical Therapy Evaluation Patient Details Name: [name] [name] MRN: [id number] DOB: 1946/01/22 Today's Date: 06/12/2021  History of Present Illness  The pt is a 75 yo male presenting 11/9 with LE edema and SOB. Upon work up pt with fluid overload and increased O2 demands. PMH includes: afib, CAD, CKD III, HTN, HLD, LBBB, NSTEMI (2018), DM II, sleep apnea (sleeps in recliner), morbid obesity, and neuropathy. Uses 3-4L O2 at baseline.   Clinical Impression  Pt in bed upon arrival of PT, agreeable to evaluation at this time. Prior to admission the pt was mobilizing short distances in the home (10-15 ft) with use of RW, but reports he mostly uses either power WC or electric scooter for mobility due to poor endurance and SOB at baseline. He does report independence with all transfers and ADLs, but that any activity usually takes increased time due to SOB. The pt now presents with limitations in functional mobility, strength, power, dynamic stability, and cardiovascular endurance due to above dx, and will continue to benefit from skilled PT to address these deficits. The pt was only able to tolerate sit-stand attempts at this time due to both reports of fatigue and desat of SpO2 from 88-92% at rest to low of 85% on 4L. The pt recovered with 4-5 min seated rest, but was unable to progress to short distance in the room at this time. Will continue to benefit from maximal mobility and skilled PT acutely as well as in [facility] setting after d/c to improve stability and endurance back to prior level of independence.       Recommendations for follow up therapy are one component of a multi-disciplinary discharge planning process, led by the attending physician.  Recommendations may be updated based on patient status, additional functional criteria and insurance authorization.  Follow Up Recommendations Home health PT    Assistance Recommended at Discharge Intermittent Supervision/Assistance  Functional Status  Assessment Patient has had a recent decline in their functional status and demonstrates the ability to make significant improvements in function in a reasonable and predictable amount of time.  Equipment Recommendations  None recommended by PT (pt well equipped)    Recommendations for Other Services       Precautions / Restrictions Precautions Precautions: Fall Precaution Comments: watch O2 Restrictions Weight Bearing Restrictions: No      Mobility  Bed Mobility Overal bed mobility: Needs Assistance             General bed mobility comments: pt OOB in recliner for session    Transfers Overall transfer level: Needs assistance Equipment used: Rolling walker (2 wheels) Transfers: Sit to/from Stand Sit to Stand: Min guard;Min assist           General transfer comment: minA to complete power up as pt with poor anterior shift on one attempt, completed with minG on second attempt. Pt desats to low of 85% on RA and required 4-5 min to recover to 90s with seated rest and 4L.    Ambulation/Gait               General Gait Details: deferred due to pt reports of fatigue and desat with static standing      Balance Overall balance assessment: Needs assistance Sitting-balance support: No upper extremity supported Sitting balance-Leahy Scale: Good Sitting balance - Comments: pt able to maintain without UE support or back support   Standing balance support: Bilateral upper extremity supported Standing balance-Leahy Scale: Poor Standing balance comment: pt dependent on BUE support on  RW                             Pertinent Vitals/Pain Pain Assessment: No/denies pain    Home Living Family/patient expects to be discharged to:: Private residence Living Arrangements: Children (and son in Sports coach) Available Help at Discharge: Family;Available PRN/intermittently (daughter home, but also using electric wheelchair) Type of Home: House Home Access: Ramped  entrance;Stairs to enter       Home Layout: One level Home Equipment: Conservation officer, nature (2 wheels);Cane - single point;Toilet riser;Wheelchair - power;Electric scooter Additional Comments: pt sleeps in recliner, uses 3-4L O2 at home, has pulse ox to monitor    Prior Function Prior Level of Function : Independent/Modified Independent             Mobility Comments: pt reports transfering from recliner to power chair without assist or need for assist, but limited by SOB ADLs Comments: pt reports use of long shoe handle, mostly does not wear shoes     Hand Dominance   Dominant Hand: Right    Extremity/Trunk Assessment   Upper Extremity Assessment Upper Extremity Assessment: Overall WFL for tasks assessed    Lower Extremity Assessment Lower Extremity Assessment: Generalized weakness (significant BLE, with many small cuts/open wounds)    Cervical / Trunk Assessment Cervical / Trunk Assessment: Kyphotic  Communication   Communication: No difficulties  Cognition Arousal/Alertness: Awake/alert Behavior During Therapy: WFL for tasks assessed/performed Overall Cognitive Status: Within Functional Limits for tasks assessed                                          General Comments General comments (skin integrity, edema, etc.): HR stable 50s-70s, SpO2 88-92% on 4L O2 at rest, low of 85% with standing    Exercises     Assessment/Plan    PT Assessment Patient needs continued PT services  PT Problem List Decreased strength;Decreased range of motion;Decreased activity tolerance;Decreased balance;Decreased mobility;Decreased coordination;Decreased safety awareness;Decreased knowledge of precautions;Cardiopulmonary status limiting activity       PT Treatment Interventions DME instruction;Gait training;Stair training;Functional mobility training;Therapeutic activities;Therapeutic exercise;Balance training;Patient/family education    PT Goals (Current goals can be  found in the Care Plan section)  Acute Rehab PT Goals Patient Stated Goal: improve breathing so he can do more for himself PT Goal Formulation: With patient Time For Goal Achievement: 06/26/21 Potential to Achieve Goals: Good    Frequency Min 3X/week    AM-PAC PT "6 Clicks" Mobility  Outcome Measure Help needed turning from your back to your side while in a flat bed without using bedrails?: A Little Help needed moving from lying on your back to sitting on the side of a flat bed without using bedrails?: A Little Help needed moving to and from a bed to a chair (including a wheelchair)?: A Little Help needed standing up from a chair using your arms (e.g., wheelchair or bedside chair)?: A Little Help needed to walk in hospital room?: A Lot Help needed climbing 3-5 steps with a railing? : A Lot 6 Click Score: 16    End of Session Equipment Utilized During Treatment: Gait belt;Oxygen Activity Tolerance: Patient limited by fatigue Patient left: in chair;with call bell/phone within reach Nurse Communication: Mobility status PT Visit Diagnosis: Other abnormalities of gait and mobility (R26.89);Muscle weakness (generalized) (M62.81);Difficulty in walking, not elsewhere classified (R26.2)  Time: 2787-1836 PT Time Calculation (min) (ACUTE ONLY): 24 min   Charges:   PT Evaluation $PT Eval Low Complexity: 1 Low PT Treatments $Therapeutic Activity: 8-22 mins        [place], PT, DPT   Acute Rehabilitation Department Pager #: [phone]  Sandra Cockayne 06/12/2021, 10:35 AM

## 2021-06-12 NOTE — Plan of Care (Signed)
  Problem: Health Behavior/Discharge Planning: Goal: Ability to manage health-related needs will improve Outcome: Not Progressing   Problem: Clinical Measurements: Goal: Ability to maintain clinical measurements within normal limits will improve Outcome: Not Progressing Goal: Diagnostic test results will improve Outcome: Not Progressing Goal: Respiratory complications will improve Outcome: Not Progressing   Problem: Activity: Goal: Risk for activity intolerance will decrease Outcome: Not Progressing   Problem: Education: Goal: Ability to demonstrate management of disease process will improve Outcome: Not Progressing Goal: Ability to verbalize understanding of medication therapies will improve Outcome: Not Progressing

## 2021-06-12 NOTE — Progress Notes (Signed)
Initial Nutrition Assessment  DOCUMENTATION CODES:   Morbid obesity  INTERVENTION:  -1 packet Juven BID, each packet provides 95 calories, 2.5 grams of protein (collagen), and 9.8 grams of carbohydrate (3 grams sugar); also contains 7 grams of L-arginine and L-glutamine, 300 mg vitamin C, 15 mg vitamin E, 1.2 mcg vitamin B-12, 9.5 mg zinc, 200 mg calcium, and 1.5 g  Calcium Beta-hydroxy-Beta-methylbutyrate to support wound healing  - Liberalize to heart healthy diet to provide more options for adequate PO intake  NUTRITION DIAGNOSIS:   Increased nutrient needs related to chronic illness (acute on chronic CHF) as evidenced by estimated needs.  GOAL:   Patient will meet greater than or equal to 90% of their needs  MONITOR:   PO intake, Supplement acceptance, Labs  REASON FOR ASSESSMENT:   Consult Other (Comment) (nutrition goals)  ASSESSMENT:   Pt from home. Admitted with SOB secondary to acute on chronic CHF. PMH includes afib, CAD, CKD IV, HTN, hyperlipidemia, COPD, hypothyroidism, obesity, non-hodgkin's lymphoma, T2DM, and OSA.  Pt reports having a good appetite PTA. He typically eats 2 meals per day, he will usually prepare brunch at home and then has dinner. He enjoys foods such as pot roast, roast beef, and cooked vegetables. He recently acquired a home helped who will help prepare meals as he has been using an electric wheelchair x6 months. He rarely eats out and most of his meals are prepared at home. He has been using insulin at home to manage his diabetes and is checking his blood sugar daily and is satisfied with where he is currently. Spoke with pt about the importance of balanced nutrition for diabetes management as well as a reduced sodium diet and daily weighing to manage his heart health. He states that he is really enjoying the hospital foods and is eating 100% of most meals. At home he sometimes drinks Glucerna. Noted lower extremity scabs which pt states that has been  on-going for quite some time. He is amenable to receiving Juven to help promote wound healing.  Pt states that he has not been consistently checking his weight recently. However, he reports that his weight has increased since yesterday and believes this is d/t fluid shifts. Suspect pt may have muscle depletions that are being masked by significant edema. Will continue to monitor.  Medications: colace, lasix, SSI, semglee, synthroid, prednisone  Labs: BUN 38, Cr 2.56 I/Os: -1862mL since admission  UOP: 2425mL x 24 hours  NUTRITION - FOCUSED PHYSICAL EXAM:  Flowsheet Row Most Recent Value  Orbital Region No depletion  Upper Arm Region No depletion  Thoracic and Lumbar Region No depletion  Buccal Region No depletion  Temple Region Mild depletion  Clavicle Bone Region No depletion  Clavicle and Acromion Bone Region No depletion  Scapular Bone Region No depletion  Dorsal Hand Mild depletion  Patellar Region Unable to assess  [LE edema]  Anterior Thigh Region Unable to assess  [LE edema]  Posterior Calf Region Unable to assess  [LE edema]  Edema (RD Assessment) Moderate  Hair Reviewed  Eyes Reviewed  Mouth Reviewed  Skin Other (Comment)  [on-going scabs of RLE]  Nails Reviewed       Diet Order:   Diet Order             Diet heart healthy/carb modified Room service appropriate? Yes; Fluid consistency: Thin; Fluid restriction: 1500 mL Fluid  Diet effective now  EDUCATION NEEDS:   Education needs have been addressed  Skin:  Skin Assessment: Skin Integrity Issues: Skin Integrity Issues:: Other (Comment) Other: venous stasis ulcer R leg  Last BM:  06/11/21  Height:   Ht Readings from Last 1 Encounters:  06/11/21 6' (1.829 m)    Weight:   Wt Readings from Last 1 Encounters:  06/12/21 (!) 138.1 kg    BMI:  Body mass index is 41.29 kg/m.  Estimated Nutritional Needs:   Kcal:  1800-2000  Protein:  110-120g  Fluid:  >1.8L  Clayborne Dana, RDN, LDN Clinical Nutrition

## 2021-06-12 NOTE — Progress Notes (Signed)
PROGRESS NOTE    [name] [name]  NKN:[id number] DOB: Jul 13, 1946 DOA: 06/11/2021 PCP: Nickola Major, MD   Brief Narrative:  HPI: [name] [name] is a 75 y.o. male with medical history significant of afib; CAD s/p stent; stage 3 CKD; HTN; HLD; COPD; hypothyroidism; morbid obesity; NHL treated with XRT and chemotherapy); DM; and OSA presenting with SOB.  He was last seen in the ER on 11/2 for SOB and LE edema; this was thought to be multifactorial due to COPD and CHF and he was given steroids, antibiotics, and an inhaler and discharged with ongoing dosing of current Torsemide dose.  He reports that he has CHF and was going to see the doctor today and he noticed he was swelling up.  He was using his home O2 but was still feeling SOB.  Symptoms started yesterday and worsened today.  He wears 3-4L at home.  Some cough, nonproductive. He does not lie flat.  Occasional PND, unchanged from prior.  He sleeps in a recliner.  His weight was up about 9 pounds today, he did not check his weight yesterday.  He misses 2-3 doses a week of medications.       ED Course: Fluid overlloaded.  Went to pulm on home 4L and in 60s - placed on NRB and now stable on home 4L.  Compliant with Torsemide but increased edema and +pulm edema.  Labs better than prior.    Assessment & Plan:   Principal Problem:   Acute on chronic diastolic CHF (congestive heart failure) (HCC) Active Problems:   Chronic kidney disease Stage IV   Morbid obesity (HCC)   Type 2 diabetes mellitus with stage 4 chronic kidney disease (HCC)   Hypothyroidism   Benign essential hypertension   AF (paroxysmal atrial fibrillation) (HCC)   Chronic respiratory failure with hypoxia and hypercapnia (HCC)  Chronic hypoxic respiratory failure/acute on chronic diastolic CHF: Echo 3/79/0240 shows 55 to 60% ejection fraction.  Uses 3 to 4 L of oxygen at home, currently on 4 L.  Shortness of breath is slightly better than yesterday but he feels that his edema is  worse than what he normally lives with and his weight is up 9 pounds in the last couple of days.  Takes 40 mg of Lasix p.o. twice daily.  Chest x-ray shows pulmonary edema.  Has been started on 40 mg IV twice daily.  Urine output 2.5 L since admission.  We will continue this with the strict I's and O's, low-sodium diet and daily weight.   COPD with possible acute exacerbation: Still has scattered end expiratory wheezes.  This could very well be due to CHF itself but COPD cannot be ruled out.  We will continue prednisone and bronchodilators.   Possible OSA: Patient tells me that he had sleep study done several years ago but was never prescribed CPAP machine.  He tells me that he is under the impression that he is scheduled to have sleep study as outpatient.  I advised him to make sure that he is otherwise scheduled 1 for him.  Continue CPAP while he is here.    Permanent atrial fibrillation: Rates controlled.  Continue bisoprolol and Eliquis.   HTN: Controlled.  Continue bisoprolol and as needed hydralazine.  HLD -Continue Zocor   DM type II: -Last A1c was 5.8, indicating good control.  Blood sugar controlled with current dose of Tresiba and SSI.  CAD -s/p stent -Continue Imdur.  Currently asymptomatic.   Hypothyroidism: Continue Synthroid.  Morbid obesity -BMI is 41.4  -Weight loss encouraged. -Outpatient PCP/bariatric medicine f/u encouraged    NHL  -treated with XRT and chemotherapy -He is now doing annual f/u, due again in 09/2021   Stage 4 CKD: At baseline.  Chronic bilateral lower extremity wounds: Patient manages them himself at home.  Nursing to manage them while here.  Unna boot ordered.  DVT prophylaxis: Place TED hose Start: 06/11/21 2015   Code Status: Full Code  Family Communication:  None present at bedside.  Plan of care discussed with patient in length and he verbalized understanding and agreed with it.  Status is: Observation  The patient will require care  spanning > 2 midnights and should be moved to inpatient because: Needs further management of acute medical issues.  Estimated body mass index is 41.29 kg/m as calculated from the following:   Height as of this encounter: 6' (1.829 m).   Weight as of this encounter: 138.1 kg.     Nutritional Assessment: Body mass index is 41.29 kg/m.Marland Kitchen Seen by dietician.  I agree with the assessment and plan as outlined below: Nutrition Status:        .  Skin Assessment: I have examined the patient's skin and I agree with the wound assessment as performed by the wound care RN as outlined below:    Consultants:  None  Procedures:  None  Antimicrobials:  Anti-infectives (From admission, onward)    None          Subjective: Patient seen and examined.  He states that his breathing is slightly better than yesterday.  He still feels fluid overloaded and appears to be fluid overloaded as well.  No other complaint.  Objective: Vitals:   06/12/21 0328 06/12/21 0500 06/12/21 0700 06/12/21 0756  BP: 123/75  (!) 133/101   Pulse: 74  71 66  Resp: 16   18  Temp: 98.4 F (36.9 C)  98.4 F (36.9 C)   TempSrc: Oral  Oral   SpO2: 91%  93% 93%  Weight:  (!) 138.1 kg    Height:        Intake/Output Summary (Last 24 hours) at 06/12/2021 1003 Last data filed at 06/12/2021 7253 Gross per 24 hour  Intake 607 ml  Output 2475 ml  Net -1868 ml   Filed Weights   06/12/21 0500  Weight: (!) 138.1 kg    Examination:  General exam: Appears calm and comfortable, morbidly obese Respiratory system: Scattered end expiratory wheezes bilaterally. Respiratory effort normal. Cardiovascular system: S1 & S2 heard, RRR. No JVD, murmurs, rubs, gallops or clicks.  +3 pitting edema bilateral lower extremity. Gastrointestinal system: Abdomen is nondistended, soft and nontender. No organomegaly or masses felt. Normal bowel sounds heard. Central nervous system: Alert and oriented. No focal neurological  deficits. Extremities: Symmetric 5 x 5 power. Skin: Chronic lower extremity vascular stasis and wounds. Psychiatry: Judgement and insight appear normal. Mood & affect appropriate.    Data Reviewed: I have personally reviewed following labs and imaging studies  CBC: Recent Labs  Lab 06/11/21 1338 06/12/21 0200  WBC 8.4 7.1  NEUTROABS 6.3  --   HGB 14.0 14.4  HCT 44.3 46.3  MCV 89.7 89.6  PLT 141* 664*   Basic Metabolic Panel: Recent Labs  Lab 06/11/21 1338 06/12/21 0200  NA 142 141  K 3.5 3.5  CL 103 105  CO2 28 25  GLUCOSE 88 71  BUN 35* 38*  CREATININE 2.48* 2.56*  CALCIUM 8.7* 8.8*  GFR: Estimated Creatinine Clearance: 36.5 mL/min (A) (by C-G formula based on SCr of 2.56 mg/dL (H)). Liver Function Tests: Recent Labs  Lab 06/11/21 1338  AST 13*  ALT 10  ALKPHOS 100  BILITOT 1.0  PROT 6.5  ALBUMIN 3.6   No results for input(s): LIPASE, AMYLASE in the last 168 hours. No results for input(s): AMMONIA in the last 168 hours. Coagulation Profile: No results for input(s): INR, PROTIME in the last 168 hours. Cardiac Enzymes: No results for input(s): CKTOTAL, CKMB, CKMBINDEX, TROPONINI in the last 168 hours. BNP (last 3 results) No results for input(s): PROBNP in the last 8760 hours. HbA1C: No results for input(s): HGBA1C in the last 72 hours. CBG: Recent Labs  Lab 06/11/21 1645 06/11/21 2122 06/12/21 0621  GLUCAP 90 110* 74   Lipid Profile: No results for input(s): CHOL, HDL, LDLCALC, TRIG, CHOLHDL, LDLDIRECT in the last 72 hours. Thyroid Function Tests: No results for input(s): TSH, T4TOTAL, FREET4, T3FREE, THYROIDAB in the last 72 hours. Anemia Panel: No results for input(s): VITAMINB12, FOLATE, FERRITIN, TIBC, IRON, RETICCTPCT in the last 72 hours. Sepsis Labs: No results for input(s): PROCALCITON, LATICACIDVEN in the last 168 hours.  Recent Results (from the past 240 hour(s))  Resp Panel by RT-PCR (Flu A&B, Covid) Nasopharyngeal Swab      Status: None   Collection Time: 06/04/21  6:48 PM   Specimen: Nasopharyngeal Swab; Nasopharyngeal(NP) swabs in vial transport medium  Result Value Ref Range Status   SARS Coronavirus 2 by RT PCR NEGATIVE NEGATIVE Final    Comment: (NOTE) SARS-CoV-2 target nucleic acids are NOT DETECTED.  The SARS-CoV-2 RNA is generally detectable in upper respiratory specimens during the acute phase of infection. The lowest concentration of SARS-CoV-2 viral copies this assay can detect is 138 copies/mL. A negative result does not preclude SARS-Cov-2 infection and should not be used as the sole basis for treatment or other patient management decisions. A negative result may occur with  improper specimen collection/handling, submission of specimen other than nasopharyngeal swab, presence of viral mutation(s) within the areas targeted by this assay, and inadequate number of viral copies(<138 copies/mL). A negative result must be combined with clinical observations, patient history, and epidemiological information. The expected result is Negative.  Fact Sheet for Patients:  EntrepreneurPulse.com.au  Fact Sheet for Healthcare Providers:  IncredibleEmployment.be  This test is no t yet approved or cleared by the [place] FDA and  has been authorized for detection and/or diagnosis of SARS-CoV-2 by FDA under an Emergency Use Authorization (EUA). This EUA will remain  in effect (meaning this test can be used) for the duration of the COVID-19 declaration under Section 564(b)(1) of the Act, 21 U.S.C.section 360bbb-3(b)(1), unless the authorization is terminated  or revoked sooner.       Influenza A by PCR NEGATIVE NEGATIVE Final   Influenza B by PCR NEGATIVE NEGATIVE Final    Comment: (NOTE) The Xpert Xpress SARS-CoV-2/FLU/RSV plus assay is intended as an aid in the diagnosis of influenza from Nasopharyngeal swab specimens and should not be used as a sole basis  for treatment. Nasal washings and aspirates are unacceptable for Xpert Xpress SARS-CoV-2/FLU/RSV testing.  Fact Sheet for Patients: EntrepreneurPulse.com.au  Fact Sheet for Healthcare Providers: IncredibleEmployment.be  This test is not yet approved or cleared by the [place] FDA and has been authorized for detection and/or diagnosis of SARS-CoV-2 by FDA under an Emergency Use Authorization (EUA). This EUA will remain in effect (meaning this test can be used) for the  duration of the COVID-19 declaration under Section 564(b)(1) of the Act, 21 U.S.C. section 360bbb-3(b)(1), unless the authorization is terminated or revoked.  Performed at [facility], [address], [place], [place] [address]   Resp Panel by RT-PCR (Flu A&B, Covid) Nasopharyngeal Swab     Status: None   Collection Time: 06/11/21  5:29 PM   Specimen: Nasopharyngeal Swab; Nasopharyngeal(NP) swabs in vial transport medium  Result Value Ref Range Status   SARS Coronavirus 2 by RT PCR NEGATIVE NEGATIVE Final    Comment: (NOTE) SARS-CoV-2 target nucleic acids are NOT DETECTED.  The SARS-CoV-2 RNA is generally detectable in upper respiratory specimens during the acute phase of infection. The lowest concentration of SARS-CoV-2 viral copies this assay can detect is 138 copies/mL. A negative result does not preclude SARS-Cov-2 infection and should not be used as the sole basis for treatment or other patient management decisions. A negative result may occur with  improper specimen collection/handling, submission of specimen other than nasopharyngeal swab, presence of viral mutation(s) within the areas targeted by this assay, and inadequate number of viral copies(<138 copies/mL). A negative result must be combined with clinical observations, patient history, and epidemiological information. The expected result is Negative.  Fact Sheet for Patients:   EntrepreneurPulse.com.au  Fact Sheet for Healthcare Providers:  IncredibleEmployment.be  This test is no t yet approved or cleared by the [place] FDA and  has been authorized for detection and/or diagnosis of SARS-CoV-2 by FDA under an Emergency Use Authorization (EUA). This EUA will remain  in effect (meaning this test can be used) for the duration of the COVID-19 declaration under Section 564(b)(1) of the Act, 21 U.S.C.section 360bbb-3(b)(1), unless the authorization is terminated  or revoked sooner.       Influenza A by PCR NEGATIVE NEGATIVE Final   Influenza B by PCR NEGATIVE NEGATIVE Final    Comment: (NOTE) The Xpert Xpress SARS-CoV-2/FLU/RSV plus assay is intended as an aid in the diagnosis of influenza from Nasopharyngeal swab specimens and should not be used as a sole basis for treatment. Nasal washings and aspirates are unacceptable for Xpert Xpress SARS-CoV-2/FLU/RSV testing.  Fact Sheet for Patients: EntrepreneurPulse.com.au  Fact Sheet for Healthcare Providers: IncredibleEmployment.be  This test is not yet approved or cleared by the [place] FDA and has been authorized for detection and/or diagnosis of SARS-CoV-2 by FDA under an Emergency Use Authorization (EUA). This EUA will remain in effect (meaning this test can be used) for the duration of the COVID-19 declaration under Section 564(b)(1) of the Act, 21 U.S.C. section 360bbb-3(b)(1), unless the authorization is terminated or revoked.  Performed at [facility], [place] [address], [place], [place] [address]   MRSA Next Gen by PCR, Nasal     Status: None   Collection Time: 06/11/21  7:01 PM   Specimen: Nasal Mucosa; Nasal Swab  Result Value Ref Range Status   MRSA by PCR Next Gen NOT DETECTED NOT DETECTED Final    Comment: (NOTE) The GeneXpert MRSA Assay (FDA approved for NASAL specimens only), is one component of a  comprehensive MRSA colonization surveillance program. It is not intended to diagnose MRSA infection nor to guide or monitor treatment for MRSA infections. Test performance is not FDA approved in patients less than 83 years old. Performed at [facility], [place] [address] [place], [place] [address]       Radiology Studies: DG Chest Portable 1 View  Result Date: 06/11/2021 CLINICAL DATA:  Shortness of breath. EXAM:  PORTABLE CHEST 1 VIEW COMPARISON:  06/04/2021 FINDINGS: 1344 hours. Low volume lordotic film. The cardio pericardial silhouette is enlarged. Diffuse interstitial opacity again noted. Bibasilar atelectasis/infiltrate again noted with tiny bilateral pleural effusions. The visualized bony structures of the thorax show no acute abnormality. Telemetry leads overlie the chest. IMPRESSION: Bibasilar atelectasis/infiltrate with tiny bilateral pleural effusions. Probable component of interstitial edema. Electronically Signed   By: Misty Stanley M.D.   On: 06/11/2021 13:52    Scheduled Meds:  apixaban  5 mg Oral BID   bisoprolol  5 mg Oral Daily   docusate sodium  100 mg Oral BID   furosemide  40 mg Intravenous BID   insulin aspart  0-15 Units Subcutaneous TID WC   insulin aspart  0-5 Units Subcutaneous QHS   insulin glargine-yfgn  26 Units Subcutaneous Q2200   ipratropium-albuterol  3 mL Nebulization Q6H   isosorbide mononitrate  30 mg Oral Daily   levothyroxine  300 mcg Oral QAC breakfast   loratadine  5 mg Oral Daily   mometasone-formoterol  2 puff Inhalation BID   predniSONE  40 mg Oral Q breakfast   simvastatin  5 mg Oral Daily   sodium chloride flush  3 mL Intravenous Q12H   Continuous Infusions:   LOS: 0 days   Time spent: 35 minutes   Darliss Cheney, MD [facility]  06/12/2021, 10:03 AM  Please page via [facility] and do not message via secure chat for anything urgent. Secure chat can be used for anything non urgent.  How to contact the [facility] Attending or  Consulting provider [place] or covering provider during after hours [place], for this patient?  Check the care team in [facility] and look for a) attending/consulting [facility] provider listed and b) the [facility] team listed. Page or secure chat [phone]. Log into [email or web address] and use [facility]'s universal password to access. If you do not have the password, please contact the hospital operator. Locate the [facility] provider you are looking for under [facility] and page to a number that you can be directly reached. If you still have difficulty reaching the provider, please page the [facility] (Director on Call) for the Hospitalists listed on amion for assistance.

## 2021-06-12 NOTE — Progress Notes (Signed)
Pt endorses that CPAP mask leaks. Respiratory paged.

## 2021-06-12 NOTE — Consult Note (Signed)
[facility] Nurse Consult Note: Reason for Consult: venous stasis; pending Unna's boot application  Wound type: venous dermatitis with associated small superficial ulcers/scabbing  Pressure Injury POA: NA Measurement:various scattered lesions; all less than 0.5cm in circumference  Wound XBJ:YNWG moist areas of weeping Drainage (amount, consistency, odor) serous  Periwound: severe edema  Dressing procedure/placement/frequency: No topical therapy needed to wounds, would recommend direct placement of Unna's boots over the scabbed and small weeping areas. Calamine/Zinc comb in the wraps will address these and the venous dermatitis.   Hospitalist managing Unna's boots order.  Discussed POC with patient and bedside nurse.  Re consult if needed, will not follow at this time. Thanks  Dorell Gatlin [employer], RN,CWOCN, CNS, [place] [phone])

## 2021-06-12 NOTE — Progress Notes (Incomplete)
Pt woke up confused and

## 2021-06-13 ENCOUNTER — Inpatient Hospital Stay (HOSPITAL_COMMUNITY): Payer: Medicare HMO

## 2021-06-13 ENCOUNTER — Ambulatory Visit: Payer: Medicare HMO | Admitting: Cardiology

## 2021-06-13 DIAGNOSIS — I5033 Acute on chronic diastolic (congestive) heart failure: Secondary | ICD-10-CM | POA: Diagnosis not present

## 2021-06-13 LAB — BASIC METABOLIC PANEL
Anion gap: 10 (ref 5–15)
BUN: 44 mg/dL — ABNORMAL HIGH (ref 8–23)
CO2: 25 mmol/L (ref 22–32)
Calcium: 8.6 mg/dL — ABNORMAL LOW (ref 8.9–10.3)
Chloride: 105 mmol/L (ref 98–111)
Creatinine, Ser: 2.6 mg/dL — ABNORMAL HIGH (ref 0.61–1.24)
GFR, Estimated: 25 mL/min — ABNORMAL LOW (ref >60)
Glucose, Bld: 109 mg/dL — ABNORMAL HIGH (ref 70–99)
Potassium: 4 mmol/L (ref 3.5–5.1)
Sodium: 140 mmol/L (ref 135–145)

## 2021-06-13 LAB — GLUCOSE, CAPILLARY
Glucose-Capillary: 137 mg/dL — ABNORMAL HIGH (ref 70–99)
Glucose-Capillary: 125 mg/dL — ABNORMAL HIGH (ref 70–99)
Glucose-Capillary: 158 mg/dL — ABNORMAL HIGH (ref 70–99)
Glucose-Capillary: 143 mg/dL — ABNORMAL HIGH (ref 70–99)

## 2021-06-13 MED ORDER — FUROSEMIDE 10 MG/ML IJ SOLN
60.0000 mg | Freq: Two times a day (BID) | INTRAMUSCULAR | Status: DC
  Administered 2021-06-13 – 2021-06-14 (×3): 60 mg via INTRAVENOUS
  Filled 2021-06-13 (×3): qty 6

## 2021-06-13 MED ORDER — IPRATROPIUM-ALBUTEROL 0.5-2.5 (3) MG/3ML IN SOLN
3.0000 mL | Freq: Two times a day (BID) | RESPIRATORY_TRACT | Status: DC
  Administered 2021-06-14 – 2021-06-18 (×9): 3 mL via RESPIRATORY_TRACT
  Filled 2021-06-13 (×9): qty 3

## 2021-06-13 NOTE — Progress Notes (Signed)
Unna boots removed for ultrasound  of the lower extremities.

## 2021-06-13 NOTE — Progress Notes (Signed)
PROGRESS NOTE    [name] [name]  DZH:[id number] DOB: [birth year] DOA: 06/11/2021 PCP: Nickola Major, MD   Brief Narrative:  HPI: [name] [name] is a 75 y.o. male with medical history significant of afib; CAD s/p stent; stage 3 CKD; HTN; HLD; COPD; hypothyroidism; morbid obesity; NHL treated with XRT and chemotherapy); DM; and OSA presenting with SOB.  He was last seen in the ER on 11/2 for SOB and LE edema; this was thought to be multifactorial due to COPD and CHF and he was given steroids, antibiotics, and an inhaler and discharged with ongoing dosing of current Torsemide dose.  He reports that he has CHF and was going to see the doctor today and he noticed he was swelling up.  He was using his home O2 but was still feeling SOB.  Symptoms started yesterday and worsened today.  He wears 3-4L at home.  Some cough, nonproductive. He does not lie flat.  Occasional PND, unchanged from prior.  He sleeps in a recliner.  His weight was up about 9 pounds today, he did not check his weight yesterday.  He misses 2-3 doses a week of medications.       ED Course: Fluid overlloaded.  Went to pulm on home 4L and in 60s - placed on NRB and now stable on home 4L.  Compliant with Torsemide but increased edema and +pulm edema.  Labs better than prior.    Assessment & Plan:   Principal Problem:   Acute on chronic diastolic CHF (congestive heart failure) (HCC) Active Problems:   Chronic kidney disease Stage IV   Morbid obesity (HCC)   Type 2 diabetes mellitus with stage 4 chronic kidney disease (HCC)   Hypothyroidism   Benign essential hypertension   AF (paroxysmal atrial fibrillation) (HCC)   Chronic respiratory failure with hypoxia and hypercapnia (HCC)   CHF (congestive heart failure) (HCC)  Chronic hypoxic respiratory failure/acute on chronic diastolic CHF: Echo 11/19/6220 shows 55 to 60% ejection fraction.  Uses 3 to 4 L of oxygen at home, currently on 5 L.  175 0 mL output with net -2.6 L since  admission.  Still has significant fluid overload, massive edema in bilateral lower extremities.  Will increase Lasix to 60 mg IV twice daily.  Strict I's and O's and daily weight with low-sodium diet.   COPD with possible acute exacerbation: No more wheezes.  Continue prednisone for total of 4 to 5 days.  Continue bronchodilators.   Possible OSA: Patient tells me that he had sleep study done several years ago but was never prescribed CPAP machine.  He tells me that he is under the impression that he is scheduled to have sleep study as outpatient.  I advised him to make sure that he is otherwise scheduled 1 for him.  Continue CPAP while he is here.    Permanent atrial fibrillation: Rates controlled.  Continue bisoprolol and Eliquis.   HTN: Controlled.  Continue bisoprolol and as needed hydralazine.  HLD -Continue Zocor   DM type II: -Last A1c was 5.8, indicating good control.  Blood sugar controlled with current dose of Tresiba and SSI.  CAD -s/p stent -Continue Imdur.  Currently asymptomatic.   Hypothyroidism: Continue Synthroid.   Morbid obesity -BMI is 41.4  -Weight loss encouraged. -Outpatient PCP/bariatric medicine f/u encouraged    NHL  -treated with XRT and chemotherapy -He is now doing annual f/u, due again in 09/2021   Stage 4 CKD: At baseline.  Chronic bilateral lower  extremity wounds: Patient manages them himself at home.  Nursing to manage them while here.  Unna boot ordered.  DVT prophylaxis:    Code Status: Full Code  Family Communication:  None present at bedside.  Plan of care discussed with patient in length and he verbalized understanding and agreed with it.  Status is: Inpatient  Remains inpatient appropriate because: Needs more IV diuretics.  Estimated body mass index is 41.38 kg/m as calculated from the following:   Height as of this encounter: 6' (1.829 m).   Weight as of this encounter: 138.4 kg.     Nutritional Assessment: Body mass index is  41.38 kg/m.Marland Kitchen Seen by dietician.  I agree with the assessment and plan as outlined below: Nutrition Status: Nutrition Problem: Increased nutrient needs Etiology: chronic illness (acute on chronic CHF) Signs/Symptoms: estimated needs Interventions: Liberalize Diet  .  Skin Assessment: I have examined the patient's skin and I agree with the wound assessment as performed by the wound care RN as outlined below:    Consultants:  None  Procedures:  None  Antimicrobials:  Anti-infectives (From admission, onward)    None          Subjective: Patient seen and examined.  Very minimal shortness of breath but feels improved compared to yesterday.  No other complaint.  Objective: Vitals:   06/13/21 0800 06/13/21 0842 06/13/21 0846 06/13/21 0852  BP: 131/82     Pulse: (!) 56   (!) 57  Resp: 19   19  Temp:      TempSrc:      SpO2: 96% 98% 98% 93%  Weight:      Height:        Intake/Output Summary (Last 24 hours) at 06/13/2021 1121 Last data filed at 06/13/2021 5701 Gross per 24 hour  Intake 960 ml  Output 1750 ml  Net -790 ml    Filed Weights   06/12/21 0500 06/13/21 0348  Weight: (!) 138.1 kg (!) 138.4 kg    Examination:  General exam: Appears calm and comfortable, morbidly obese  respiratory system: Diminished breath sounds bilaterally, respiratory effort normal. Cardiovascular system: S1 & S2 heard, RRR. No JVD, murmurs, rubs, gallops or clicks.  3 pitting edema bilateral lower extremity. Gastrointestinal system: Abdomen is nondistended, soft and nontender. No organomegaly or masses felt. Normal bowel sounds heard. Central nervous system: Alert and oriented. No focal neurological deficits. Extremities: Symmetric 5 x 5 power. Skin: Chronic wounds bilateral lower extremities. Psychiatry: Judgement and insight appear normal. Mood & affect appropriate.   Data Reviewed: I have personally reviewed following labs and imaging studies  CBC: Recent Labs  Lab  06/11/21 1338 06/12/21 0200  WBC 8.4 7.1  NEUTROABS 6.3  --   HGB 14.0 14.4  HCT 44.3 46.3  MCV 89.7 89.6  PLT 141* 142*    Basic Metabolic Panel: Recent Labs  Lab 06/11/21 1338 06/12/21 0200 06/13/21 0104  NA 142 141 140  K 3.5 3.5 4.0  CL 103 105 105  CO2 28 25 25   GLUCOSE 88 71 109*  BUN 35* 38* 44*  CREATININE 2.48* 2.56* 2.60*  CALCIUM 8.7* 8.8* 8.6*    GFR: Estimated Creatinine Clearance: 35.9 mL/min (A) (by C-G formula based on SCr of 2.6 mg/dL (H)). Liver Function Tests: Recent Labs  Lab 06/11/21 1338  AST 13*  ALT 10  ALKPHOS 100  BILITOT 1.0  PROT 6.5  ALBUMIN 3.6    No results for input(s): LIPASE, AMYLASE in the last 168 hours.  No results for input(s): AMMONIA in the last 168 hours. Coagulation Profile: No results for input(s): INR, PROTIME in the last 168 hours. Cardiac Enzymes: No results for input(s): CKTOTAL, CKMB, CKMBINDEX, TROPONINI in the last 168 hours. BNP (last 3 results) No results for input(s): PROBNP in the last 8760 hours. HbA1C: No results for input(s): HGBA1C in the last 72 hours. CBG: Recent Labs  Lab 06/12/21 1136 06/12/21 1630 06/12/21 1853 06/12/21 2122 06/13/21 0628  GLUCAP 83 126* 232* 166* 137*    Lipid Profile: No results for input(s): CHOL, HDL, LDLCALC, TRIG, CHOLHDL, LDLDIRECT in the last 72 hours. Thyroid Function Tests: No results for input(s): TSH, T4TOTAL, FREET4, T3FREE, THYROIDAB in the last 72 hours. Anemia Panel: No results for input(s): VITAMINB12, FOLATE, FERRITIN, TIBC, IRON, RETICCTPCT in the last 72 hours. Sepsis Labs: No results for input(s): PROCALCITON, LATICACIDVEN in the last 168 hours.  Recent Results (from the past 240 hour(s))  Resp Panel by RT-PCR (Flu A&B, Covid) Nasopharyngeal Swab     Status: None   Collection Time: 06/04/21  6:48 PM   Specimen: Nasopharyngeal Swab; Nasopharyngeal(NP) swabs in vial transport medium  Result Value Ref Range Status   SARS Coronavirus 2 by RT PCR  NEGATIVE NEGATIVE Final    Comment: (NOTE) SARS-CoV-2 target nucleic acids are NOT DETECTED.  The SARS-CoV-2 RNA is generally detectable in upper respiratory specimens during the acute phase of infection. The lowest concentration of SARS-CoV-2 viral copies this assay can detect is 138 copies/mL. A negative result does not preclude SARS-Cov-2 infection and should not be used as the sole basis for treatment or other patient management decisions. A negative result may occur with  improper specimen collection/handling, submission of specimen other than nasopharyngeal swab, presence of viral mutation(s) within the areas targeted by this assay, and inadequate number of viral copies(<138 copies/mL). A negative result must be combined with clinical observations, patient history, and epidemiological information. The expected result is Negative.  Fact Sheet for Patients:  EntrepreneurPulse.com.au  Fact Sheet for Healthcare Providers:  IncredibleEmployment.be  This test is no t yet approved or cleared by the [place] FDA and  has been authorized for detection and/or diagnosis of SARS-CoV-2 by FDA under an Emergency Use Authorization (EUA). This EUA will remain  in effect (meaning this test can be used) for the duration of the COVID-19 declaration under Section 564(b)(1) of the Act, 21 U.S.C.section 360bbb-3(b)(1), unless the authorization is terminated  or revoked sooner.       Influenza A by PCR NEGATIVE NEGATIVE Final   Influenza B by PCR NEGATIVE NEGATIVE Final    Comment: (NOTE) The Xpert Xpress SARS-CoV-2/FLU/RSV plus assay is intended as an aid in the diagnosis of influenza from Nasopharyngeal swab specimens and should not be used as a sole basis for treatment. Nasal washings and aspirates are unacceptable for Xpert Xpress SARS-CoV-2/FLU/RSV testing.  Fact Sheet for Patients: EntrepreneurPulse.com.au  Fact Sheet for  Healthcare Providers: IncredibleEmployment.be  This test is not yet approved or cleared by the [place] FDA and has been authorized for detection and/or diagnosis of SARS-CoV-2 by FDA under an Emergency Use Authorization (EUA). This EUA will remain in effect (meaning this test can be used) for the duration of the COVID-19 declaration under Section 564(b)(1) of the Act, 21 U.S.C. section 360bbb-3(b)(1), unless the authorization is terminated or revoked.  Performed at [facility], [address], [place], [place] [address]   Resp Panel by RT-PCR (Flu A&B, Covid) Nasopharyngeal Swab     Status:  None   Collection Time: 06/11/21  5:29 PM   Specimen: Nasopharyngeal Swab; Nasopharyngeal(NP) swabs in vial transport medium  Result Value Ref Range Status   SARS Coronavirus 2 by RT PCR NEGATIVE NEGATIVE Final    Comment: (NOTE) SARS-CoV-2 target nucleic acids are NOT DETECTED.  The SARS-CoV-2 RNA is generally detectable in upper respiratory specimens during the acute phase of infection. The lowest concentration of SARS-CoV-2 viral copies this assay can detect is 138 copies/mL. A negative result does not preclude SARS-Cov-2 infection and should not be used as the sole basis for treatment or other patient management decisions. A negative result may occur with  improper specimen collection/handling, submission of specimen other than nasopharyngeal swab, presence of viral mutation(s) within the areas targeted by this assay, and inadequate number of viral copies(<138 copies/mL). A negative result must be combined with clinical observations, patient history, and epidemiological information. The expected result is Negative.  Fact Sheet for Patients:  EntrepreneurPulse.com.au  Fact Sheet for Healthcare Providers:  IncredibleEmployment.be  This test is no t yet approved or cleared by the [place] FDA and  has been authorized for  detection and/or diagnosis of SARS-CoV-2 by FDA under an Emergency Use Authorization (EUA). This EUA will remain  in effect (meaning this test can be used) for the duration of the COVID-19 declaration under Section 564(b)(1) of the Act, 21 U.S.C.section 360bbb-3(b)(1), unless the authorization is terminated  or revoked sooner.       Influenza A by PCR NEGATIVE NEGATIVE Final   Influenza B by PCR NEGATIVE NEGATIVE Final    Comment: (NOTE) The Xpert Xpress SARS-CoV-2/FLU/RSV plus assay is intended as an aid in the diagnosis of influenza from Nasopharyngeal swab specimens and should not be used as a sole basis for treatment. Nasal washings and aspirates are unacceptable for Xpert Xpress SARS-CoV-2/FLU/RSV testing.  Fact Sheet for Patients: EntrepreneurPulse.com.au  Fact Sheet for Healthcare Providers: IncredibleEmployment.be  This test is not yet approved or cleared by the [place] FDA and has been authorized for detection and/or diagnosis of SARS-CoV-2 by FDA under an Emergency Use Authorization (EUA). This EUA will remain in effect (meaning this test can be used) for the duration of the COVID-19 declaration under Section 564(b)(1) of the Act, 21 U.S.C. section 360bbb-3(b)(1), unless the authorization is terminated or revoked.  Performed at [facility], [place] [address] [place], [place] [address]   MRSA Next Gen by PCR, Nasal     Status: None   Collection Time: 06/11/21  7:01 PM   Specimen: Nasal Mucosa; Nasal Swab  Result Value Ref Range Status   MRSA by PCR Next Gen NOT DETECTED NOT DETECTED Final    Comment: (NOTE) The GeneXpert MRSA Assay (FDA approved for NASAL specimens only), is one component of a comprehensive MRSA colonization surveillance program. It is not intended to diagnose MRSA infection nor to guide or monitor treatment for MRSA infections. Test performance is not FDA approved in patients less than 18  years old. Performed at [facility], [place] [address], [place], [place] [address]        Radiology Studies: DG Chest Portable 1 View  Result Date: 06/11/2021 CLINICAL DATA:  Shortness of breath. EXAM: PORTABLE CHEST 1 VIEW COMPARISON:  06/04/2021 FINDINGS: 1344 hours. Low volume lordotic film. The cardio pericardial silhouette is enlarged. Diffuse interstitial opacity again noted. Bibasilar atelectasis/infiltrate again noted with tiny bilateral pleural effusions. The visualized bony structures of the thorax show no acute abnormality. Telemetry leads overlie the  chest. IMPRESSION: Bibasilar atelectasis/infiltrate with tiny bilateral pleural effusions. Probable component of interstitial edema. Electronically Signed   By: Misty Stanley M.D.   On: 06/11/2021 13:52    Scheduled Meds:  apixaban  5 mg Oral BID   bisoprolol  5 mg Oral Daily   docusate sodium  100 mg Oral BID   furosemide  60 mg Intravenous BID   insulin aspart  0-15 Units Subcutaneous TID WC   insulin aspart  0-5 Units Subcutaneous QHS   insulin glargine-yfgn  26 Units Subcutaneous Q2200   ipratropium-albuterol  3 mL Nebulization TID   isosorbide mononitrate  30 mg Oral Daily   levothyroxine  300 mcg Oral QAC breakfast   loratadine  5 mg Oral Daily   mometasone-formoterol  2 puff Inhalation BID   nutrition supplement (JUVEN)  1 packet Oral BID BM   predniSONE  40 mg Oral Q breakfast   simvastatin  5 mg Oral Daily   sodium chloride flush  3 mL Intravenous Q12H   Continuous Infusions:   LOS: 1 day   Time spent: 30 minutes   Darliss Cheney, MD [facility]  06/13/2021, 11:21 AM  Please page via [place] [place] and do not message via secure chat for anything urgent. Secure chat can be used for anything non urgent.  How to contact the [facility] Attending or Consulting provider [place] or covering provider during after hours [place], for this patient?  Check the care team in [facility] and look for a) attending/consulting TRH  provider listed and b) the [facility] team listed. Page or secure chat [phone]. Log into [email or web address] and use [place]'s universal password to access. If you do not have the password, please contact the hospital operator. Locate the [facility] provider you are looking for under [facility] and page to a number that you can be directly reached. If you still have difficulty reaching the provider, please page the [facility] (Director on Call) for the Hospitalists listed on amion for assistance.

## 2021-06-13 NOTE — TOC Initial Note (Signed)
Transition of Care [facility]) - Initial/Assessment Note    Patient Details  Name: [name] [name] MRN: [id number] Date of Birth: 26-Feb-1946  [facility] CM/SW Contact:    Verdell Carmine, RN Phone Number: 06/13/2021, 10:04 AM  Clinical Narrative:                 Contacted patient by phone to discuss discharge planning. Patient currently on CPAP. 75 YO male presented with SHOB, Leg swelling. Sleeps in a recliner and has oxygen at home. Is followed by [place] for [facility].. Will  need new orders for PT RN for wound and leg wrapping. Patient agreeable to this, however currently on CPAP, asked this RNCM to call back ;ater so he can speak more clearly.Reached out to [place] for confirmaiton.  Expected Discharge Plan: [place] Barriers to Discharge: Continued Medical Work up   Patient Goals and CMS Choice        Expected Discharge Plan and Services Expected Discharge Plan: [place]   Discharge Planning Services: CM Consult Post Acute Care Choice: [place] arrangements for the past 2 months: Single Family Home                           HH Arranged: RN, PT          Prior Living Arrangements/Services Living arrangements for the past 2 months: Single Family Home   Patient language and need for interpreter reviewed:: Yes        Need for Family Participation in Patient Care: Yes (Comment) Care giver support system in place?: Yes (comment) Current home services: Home PT, Home RN Criminal Activity/Legal Involvement Pertinent to Current Situation/Hospitalization: No - Comment as needed  Activities of Daily Living Home Assistive Devices/Equipment: Blood pressure cuff, CBG Meter, Walker (specify type) ADL Screening (condition at time of admission) Patient's cognitive ability adequate to safely complete daily activities?: Yes Is the patient deaf or have difficulty hearing?: No Does the patient have difficulty seeing, even  when wearing glasses/contacts?: No Does the patient have difficulty concentrating, remembering, or making decisions?: No Patient able to express need for assistance with ADLs?: Yes Does the patient have difficulty dressing or bathing?: No Independently performs ADLs?: Yes (appropriate for developmental age) Does the patient have difficulty walking or climbing stairs?: Yes Weakness of Legs: Both Weakness of Arms/Hands: Both  Permission Sought/Granted                  Emotional Assessment   Attitude/Demeanor/Rapport: Gracious Affect (typically observed): Calm Orientation: : Oriented to Self, Oriented to Place, Oriented to  Time, Oriented to Situation Alcohol / Substance Use: Not Applicable Psych Involvement: No (comment)  Admission diagnosis:  Acute on chronic diastolic HF (heart failure) (HCC) [I50.33] Acute on chronic diastolic CHF (congestive heart failure) (HCC) [I50.33] Acute on chronic congestive heart failure, unspecified heart failure type (HCC) [I50.9] CHF (congestive heart failure) ([place]) [I50.9] Patient Active Problem List   Diagnosis Date Noted   CKD (chronic kidney disease) stage 4, GFR 15-29 ml/min ([place]) 01/21/2021   Pleural effusion 01/20/2021   Thrombocytopenia ([place]) 01/20/2021   Elevated d-dimer 01/20/2021   Atrial fibrillation, chronic ([place]) 01/20/2021   CHF exacerbation ([place]) 12/28/2020   CHF (congestive heart failure) ([place]) 12/28/2020   Acute on chronic diastolic HF (heart failure) ([place]) 10/06/2020   Chronic respiratory failure with hypoxia and hypercapnia ([place]) 09/10/2020   Former smoker 08/10/2020   Acute  on chronic diastolic CHF (congestive heart failure) ([place]) 08/05/2020   Acute respiratory failure with hypoxia (HCC) 08/04/2020   Acute exacerbation of CHF (congestive heart failure) ([place]) 07/19/2020   AF (paroxysmal atrial fibrillation) ([place]) 07/19/2020   COVID-19    Acute on chronic respiratory failure with hypoxia and hypercapnia (HCC) 06/25/2020    Aspiration pneumonia ([place]) 06/24/2020   Pneumonia due to COVID-19 virus 06/24/2020   Confusion    Small bowel obstruction ([place]) 01/29/2020   SBO (small bowel obstruction) ([place]) 01/28/2020   Primary osteoarthritis of left knee 05/24/2018   Primary localized osteoarthritis of left knee 05/19/2018   Acute on chronic systolic and diastolic heart failure, NYHA class 1 ([place]) 04/01/2017   CAD (coronary artery disease) 04/01/2017   DOE (dyspnea on exertion) 03/21/2017   HTN (hypertension) 03/21/2017   NSTEMI (non-ST elevated myocardial infarction) ([place]) 01/19/2017   NSTEMI, initial episode of care ([place]) 01/19/2017   Sepsis ([place]) 07/24/2016   Elevated troponin 07/24/2016   Fever 07/24/2016   Lactic acidosis 07/24/2016   Adjustment insomnia 05/18/2016   Erectile dysfunction 12/19/2015   Sleep apnea 09/30/2015   Osteoarthritis 09/30/2015   Hypothyroidism 09/30/2015   Hypogonadism in male 09/30/2015   Diabetic neuropathy ([place]) 09/30/2015   Chronic pain of left knee 09/30/2015   Benign essential hypertension 09/30/2015   Acute on chronic renal failure ([place]) 05/14/2015   Diarrhea 05/14/2015   Dehydration 05/14/2015   Hypokalemia 05/14/2015   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[place]) 10/05/2014   Pelvic mass in male    Varices, esophageal (HCC)    Colonic mass    Abnormal CT scan, pelvis    Bladder mass    Elevated liver enzymes    Elevated LFTs    Abdominal pain 07/23/2014   Chronic kidney disease Stage IV 07/23/2014   Morbid obesity ([place]) 07/23/2014   Type 2 diabetes mellitus with stage 4 chronic kidney disease ([place]) 07/23/2014   Hypertension 07/23/2014   S/P total knee replacement, 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   PCP:  Nickola Major, MD Pharmacy:   [facility], [place] [address] Phone: [phone] Fax: [phone]     Social Determinants of Health (SDOH) Interventions Food Insecurity Interventions: Intervention Not  Indicated Financial Strain Interventions: Intervention Not Indicated Housing Interventions: Intervention Not Indicated Transportation Interventions: Intervention Not Indicated (previously enrolled in [facility] transportation services)  Readmission Risk Interventions Readmission Risk Prevention Plan 01/31/2021 12/29/2020 10/07/2020  Transportation Screening Complete Complete Complete  Medication Review Press photographer) - Complete Complete  PCP or Specialist appointment within 3-5 days of discharge - Complete -  [place] or Home Care Consult Complete Complete Complete  SW Recovery Care/Counseling Consult Complete Complete Complete  Palliative Care Screening Complete Not Applicable Not Applicable  Skilled Nursing Facility Complete Complete Not Applicable  Some recent data might be hidden

## 2021-06-13 NOTE — Plan of Care (Signed)
Discussed with patient plan of care for the evening, pain management and use of bedside commode with some teach back displayed.  Patient's daughter [name] called for an update to discharge date  Problem: Education: Goal: Knowledge of General Education information will improve Description: Including pain rating scale, medication(s)/side effects and non-pharmacologic comfort measures Outcome: Progressing   Problem: Health Behavior/Discharge Planning: Goal: Ability to manage health-related needs will improve Outcome: Progressing

## 2021-06-13 NOTE — Progress Notes (Addendum)
Physical Therapy Treatment Patient Details Name: [name] [name] MRN: [id number] DOB: [birth year] Today's Date: 06/13/2021   History of Present Illness Pt is a 75 yo male presenting 06/11/21 with LE edema and SOB; workup for CHF exacerbation. PMH includes: afib, CAD, CKD III, HTN, HLD, LBBB, NSTEMI (2018), DM II, sleep apnea (sleeps in recliner), morbid obesity, and neuropathy. Uses 3-4L O2 at baseline.    PT Comments    Pt is progressing well with mobility. Today's session focused on functional activities and ambulation for improving strength and activity tolerance. Pt continues to be limited by SOB but did progress from last session by ambulating into the hall. SpO2 86% on 6L with activity but recovered to 90-93% with seated rest break. Pt would benefit from further acute care PT services to continue to address functional mobility goals and increase activity tolerance. Continue to recommend [facility] PT after discharge to maximize independence.    Recommendations for follow up therapy are one component of a multi-disciplinary discharge planning process, led by the attending physician.  Recommendations may be updated based on patient status, additional functional criteria and insurance authorization.  Follow Up Recommendations  Home health PT     Assistance Recommended at Discharge Intermittent Supervision/Assistance  Equipment Recommendations  None recommended by PT    Recommendations for Other Services       Precautions / Restrictions Precautions Precautions: None Precaution Comments: monitor O2 (wears 3-4 L O2 at baseline) Restrictions Weight Bearing Restrictions: No     Mobility  Bed Mobility               General bed mobility comments: pt received in recliner    Transfers Overall transfer level: Needs assistance Equipment used: Rolling walker (2 wheels) Transfers: Sit to/from Stand Sit to Stand: Supervision           General transfer comment: pt stood from chair and  [facility] with supervision for safety; good use of UE with RW    Ambulation/Gait Ambulation/Gait assistance: Supervision Gait Distance (Feet): 30 Feet (+ 36') Assistive device: Rolling walker (2 wheels) Gait Pattern/deviations: WFL(Within Functional Limits);Step-through pattern;Decreased stride length Gait velocity: decreased     General Gait Details: pt ambulated in hallway with chair follow; ~5 min rest break required halfway through due to fatigue and SOB; supervision for safety   Stairs             Wheelchair Mobility    Modified Rankin (Stroke Patients Only)       Balance Overall balance assessment: Needs assistance Sitting-balance support: No upper extremity supported Sitting balance-Leahy Scale: Good Sitting balance - Comments: pt able to maintain without UE support or back support   Standing balance support: Bilateral upper extremity supported;During functional activity Standing balance-Leahy Scale: Poor Standing balance comment: pt dependent on BUE on RW during static standing and ambulation; dependent for posterior pericare                            Cognition Arousal/Alertness: Awake/alert Behavior During Therapy: WFL for tasks assessed/performed Overall Cognitive Status: Within Functional Limits for tasks assessed                                          Exercises      General Comments General comments (skin integrity, edema, etc.): SpO2 86% during activity on 6L O2; recovers to  90-93 with seated rest break and pursed lip breathing. Pt self-monitors his O2 regularly and was wearing his own pulse ox around his neck during therapy.      Pertinent Vitals/Pain Pain Assessment: No/denies pain    Home Living                          Prior Function            PT Goals (current goals can now be found in the care plan section) Acute Rehab PT Goals Patient Stated Goal: improve breathing so he can do more for  himself PT Goal Formulation: With patient Time For Goal Achievement: 06/26/21 Potential to Achieve Goals: Good Progress towards PT goals: Progressing toward goals    Frequency    Min 3X/week      PT Plan Current plan remains appropriate    Co-evaluation              AM-PAC PT "6 Clicks" Mobility   Outcome Measure  Help needed turning from your back to your side while in a flat bed without using bedrails?: A Little Help needed moving from lying on your back to sitting on the side of a flat bed without using bedrails?: A Little Help needed moving to and from a bed to a chair (including a wheelchair)?: A Little Help needed standing up from a chair using your arms (e.g., wheelchair or bedside chair)?: A Little Help needed to walk in hospital room?: A Little Help needed climbing 3-5 steps with a railing? : A Lot 6 Click Score: 17    End of Session Equipment Utilized During Treatment: Gait belt;Oxygen Activity Tolerance: Patient limited by fatigue Patient left: in chair;with nursing/sitter in room;with call bell/phone within reach Nurse Communication: Mobility status PT Visit Diagnosis: Other abnormalities of gait and mobility (R26.89);Muscle weakness (generalized) (M62.81);Difficulty in walking, not elsewhere classified (R26.2)     Time: 1415-1450 PT Time Calculation (min) (ACUTE ONLY): 35 min  Charges:  $Therapeutic Exercise: 8-22 mins $Therapeutic Activity: 8-22 mins                     Brandon Melnick, SPT    Brandon Melnick 06/13/2021, 3:24 PM

## 2021-06-13 NOTE — Progress Notes (Signed)
S/p breathing tx, pt request CPAP for a nap.  RN aware.  NO distress noted, pt states he is comfortable.  Sat 93%.

## 2021-06-13 NOTE — Progress Notes (Signed)
Lower extremities cleansed with soap and water. Noted with scattered.skin sores.

## 2021-06-13 NOTE — TOC Progression Note (Signed)
Transition of Care [facility]) - Progression Note    Patient Details  Name: [name] [name] MRN: [id number] Date of Birth: April 04, 1946  Transition of Care [facility]) CM/SW [place], RN Phone Number: 06/13/2021, 3:11 PM  Clinical Narrative:     Nanine Means will take patient for  Home Health. RN and PT  Expected Discharge Plan: [place] Barriers to Discharge: Continued Medical Work up  Expected Discharge Plan and Services Expected Discharge Plan: [place]   Discharge Planning Services: CM Consult Post Acute Care Choice: [place] arrangements for the past 2 months: Single Family Home                           HH Arranged: RN, PT [place] Agency: [place] Date [place]: 06/13/21 Time [place]: 1511 Representative spoke with at [place]: Angie   Social Determinants of Health (SDOH) Interventions Food Insecurity Interventions: Intervention Not Indicated Financial Strain Interventions: Intervention Not Indicated Housing Interventions: Intervention Not Indicated Transportation Interventions: Intervention Not Indicated (previously enrolled in [facility] transportation services)  Readmission Risk Interventions Readmission Risk Prevention Plan 01/31/2021 12/29/2020 10/07/2020  Transportation Screening Complete Complete Complete  Medication Review Press photographer) - Complete Complete  PCP or Specialist appointment within 3-5 days of discharge - Complete -  [place] or Home Care Consult Complete Complete Complete  SW Recovery Care/Counseling Consult Complete Complete Complete  Palliative Care Screening Complete Not Applicable Not Applicable  Skilled Nursing Facility Complete Complete Not Applicable  Some recent data might be hidden

## 2021-06-13 NOTE — Progress Notes (Signed)
I was called d/t pt requesting cpap for nap.  Pt placed on cpap, appears comfortable and no distress noted.

## 2021-06-14 ENCOUNTER — Encounter (HOSPITAL_COMMUNITY): Payer: Medicare HMO

## 2021-06-14 DIAGNOSIS — I5033 Acute on chronic diastolic (congestive) heart failure: Secondary | ICD-10-CM | POA: Diagnosis not present

## 2021-06-14 LAB — BASIC METABOLIC PANEL
Anion gap: 10 (ref 5–15)
Anion gap: 8 (ref 5–15)
BUN: 55 mg/dL — ABNORMAL HIGH (ref 8–23)
BUN: 54 mg/dL — ABNORMAL HIGH (ref 8–23)
CO2: 23 mmol/L (ref 22–32)
CO2: 28 mmol/L (ref 22–32)
Calcium: 8.5 mg/dL — ABNORMAL LOW (ref 8.9–10.3)
Calcium: 9.2 mg/dL (ref 8.9–10.3)
Chloride: 101 mmol/L (ref 98–111)
Chloride: 103 mmol/L (ref 98–111)
Creatinine, Ser: 2.67 mg/dL — ABNORMAL HIGH (ref 0.61–1.24)
Creatinine, Ser: 2.81 mg/dL — ABNORMAL HIGH (ref 0.61–1.24)
GFR, Estimated: 24 mL/min — ABNORMAL LOW (ref >60)
GFR, Estimated: 23 mL/min — ABNORMAL LOW (ref >60)
Glucose, Bld: 147 mg/dL — ABNORMAL HIGH (ref 70–99)
Glucose, Bld: 83 mg/dL (ref 70–99)
Potassium: 3.9 mmol/L (ref 3.5–5.1)
Potassium: 4.4 mmol/L (ref 3.5–5.1)
Sodium: 134 mmol/L — ABNORMAL LOW (ref 135–145)
Sodium: 139 mmol/L (ref 135–145)

## 2021-06-14 LAB — GLUCOSE, CAPILLARY
Glucose-Capillary: 91 mg/dL (ref 70–99)
Glucose-Capillary: 122 mg/dL — ABNORMAL HIGH (ref 70–99)
Glucose-Capillary: 143 mg/dL — ABNORMAL HIGH (ref 70–99)
Glucose-Capillary: 317 mg/dL — ABNORMAL HIGH (ref 70–99)

## 2021-06-14 LAB — MAGNESIUM: Magnesium: 2.3 mg/dL (ref 1.7–2.4)

## 2021-06-14 MED ORDER — FUROSEMIDE 10 MG/ML IJ SOLN
80.0000 mg | Freq: Two times a day (BID) | INTRAMUSCULAR | Status: DC
  Administered 2021-06-14 – 2021-06-17 (×6): 80 mg via INTRAVENOUS
  Filled 2021-06-14 (×7): qty 8

## 2021-06-14 NOTE — Progress Notes (Signed)
Mobility Specialist Progress Note    06/14/21 1501  Mobility  Activity Ambulated in hall  Level of Assistance Contact guard assist, steadying assist  Assistive Device Four wheel walker  Distance Ambulated (ft) 80 ft  Mobility Ambulated with assistance in hallway  Mobility Response Tolerated fair  Mobility performed by Mobility specialist  Bed Position Chair  $Mobility charge 1 Mobility   Pre-Mobility: 62 HR, 91% SpO2 Post-Mobility: 67 HR, 93% SpO2  Pt received in chair and agreeable. No complaints. On 5LO2 but ambulated on 6L. Took x1 seated break for about 5 minutes. SpO2 maintained 89-92%. Returned to chair with call bell in reach.   Hildred Alamin Mobility Specialist  Mobility Specialist Phone: [phone]

## 2021-06-14 NOTE — Plan of Care (Signed)
Discussed with patient plan of care for the evening, pain management and fluid restriction with some teach back displayed.  Problem: Education: Goal: Knowledge of General Education information will improve Description: Including pain rating scale, medication(s)/side effects and non-pharmacologic comfort measures Outcome: Progressing

## 2021-06-14 NOTE — Progress Notes (Signed)
PROGRESS NOTE    [name] [name]  OXB:[id number] DOB: Jan 04, 1946 DOA: 06/11/2021 PCP: Nickola Major, MD   Brief Narrative:  [name] [name] is a 75 y.o. male with medical history significant of afib; CAD s/p stent; stage 3 CKD; HTN; HLD; COPD; hypothyroidism; morbid obesity; NHL treated with XRT and chemotherapy); DM; and OSA presented to ED with shortness of breath.  He was last seen in the ER on 11/2 for SOB and LE edema; this was thought to be multifactorial due to COPD and CHF and he was given steroids, antibiotics, and an inhaler and discharged with ongoing dosing of current Torsemide dose.  He was using his home O2 but was still feeling SOB and noticed increased bilateral lower extremity swelling.  He wears 3-4L at home. Occasional PND, unchanged from prior.  He sleeps in a recliner.  His weight was up about 9 pounds upon admission.  Patient was admitted to hospital service due to acute on chronic diastolic congestive heart failure.  Assessment & Plan:   Principal Problem:   Acute on chronic diastolic CHF (congestive heart failure) (HCC) Active Problems:   Chronic kidney disease Stage IV   Morbid obesity (HCC)   Type 2 diabetes mellitus with stage 4 chronic kidney disease (HCC)   Hypothyroidism   Benign essential hypertension   AF (paroxysmal atrial fibrillation) (HCC)   Chronic respiratory failure with hypoxia and hypercapnia (HCC)   CHF (congestive heart failure) (HCC)  Chronic hypoxic respiratory failure/acute on chronic diastolic CHF: Echo 6/83/4196 shows 55 to 60% ejection fraction.  Uses 3 to 4 L of oxygen at home, currently on 4.5 L.  Despite of increasing Lasix to 60 mg IV twice daily, he has had only 900 cc output in 24 hours, unless the charting is not accurate.  And he has lost only 0.3 kg weight since yesterday.  Still has +3 pitting edema bilateral lower extremity and looks fluid overloaded.  Will increase Lasix to 80 mg IV twice daily.  Renal function stable.   COPD with  possible acute exacerbation: No more wheezes.  Continue prednisone for total of 5 days, will stop tomorrow.   Possible OSA: Patient tells me that he had sleep study done several years ago but was never prescribed CPAP machine.  He tells me that he is under the impression that he is scheduled to have sleep study as outpatient.  I advised him to make sure that he is otherwise scheduled 1 for him.  Continue CPAP while he is here.    Permanent atrial fibrillation: Rates controlled.  Continue bisoprolol and Eliquis.   HTN: Controlled.  Continue bisoprolol and as needed hydralazine.  HLD -Continue Zocor   DM type II: -Last A1c was 5.8, indicating good control.  Blood sugar controlled with current dose of Tresiba and SSI.  CAD -s/p stent -Continue Imdur.  Currently asymptomatic.   Hypothyroidism: Continue Synthroid.   Morbid obesity -BMI is 41.4  -Weight loss encouraged. -Outpatient PCP/bariatric medicine f/u encouraged    NHL  -treated with XRT and chemotherapy -He is now doing annual f/u, due again in 09/2021   Stage 4 CKD: At baseline.  Chronic bilateral lower extremity wounds: Patient manages them himself at home.  Nursing to manage them while here.  Unna boot ordered.  DVT prophylaxis:    Code Status: Full Code  Family Communication:  None present at bedside.  Plan of care discussed with patient in length and he verbalized understanding and agreed with it.  Status  is: Inpatient  Remains inpatient appropriate because: Needs more IV diuretics.  Estimated body mass index is 41.29 kg/m as calculated from the following:   Height as of this encounter: 6' (1.829 m).   Weight as of this encounter: 138.1 kg.     Nutritional Assessment: Body mass index is 41.29 kg/m.Marland Kitchen Seen by dietician.  I agree with the assessment and plan as outlined below: Nutrition Status: Nutrition Problem: Increased nutrient needs Etiology: chronic illness (acute on chronic CHF) Signs/Symptoms:  estimated needs Interventions: Liberalize Diet  .  Skin Assessment: I have examined the patient's skin and I agree with the wound assessment as performed by the wound care RN as outlined below:    Consultants:  None  Procedures:  None  Antimicrobials:  Anti-infectives (From admission, onward)    None          Subjective: Patient seen and examined.  Breathing is improving but is still not back to baseline.  He is still feels that his lower extremity swelling is worse than at baseline.  Objective: Vitals:   06/14/21 0600 06/14/21 0614 06/14/21 0722 06/14/21 0849  BP:   (!) 143/102   Pulse: 63  75   Resp:   (!) 22   Temp:   97.8 F (36.6 C)   TempSrc:   Oral   SpO2: 97%  93% 95%  Weight:  (!) 138.1 kg    Height:        Intake/Output Summary (Last 24 hours) at 06/14/2021 0951 Last data filed at 06/14/2021 0086 Gross per 24 hour  Intake 1377 ml  Output 700 ml  Net 677 ml    Filed Weights   06/12/21 0500 06/13/21 0348 06/14/21 0614  Weight: (!) 138.1 kg (!) 138.4 kg (!) 138.1 kg    Examination:  General exam: Appears calm and comfortable, morbidly obese Respiratory system: Faint crackles at the bases, no wheezes.Marland Kitchen Respiratory effort normal. Cardiovascular system: S1 & S2 heard, RRR. No JVD, murmurs, rubs, gallops or clicks.  +3 pitting edema bilateral lower extremity. Gastrointestinal system: Abdomen is nondistended, soft and nontender. No organomegaly or masses felt. Normal bowel sounds heard. Central nervous system: Alert and oriented. No focal neurological deficits. Extremities: Symmetric 5 x 5 power. Skin: Chronic wound bilateral lower extremities.Marland Kitchen  Psychiatry: Judgement and insight appear normal. Mood & affect appropriate.    Data Reviewed: I have personally reviewed following labs and imaging studies  CBC: Recent Labs  Lab 06/11/21 1338 06/12/21 0200  WBC 8.4 7.1  NEUTROABS 6.3  --   HGB 14.0 14.4  HCT 44.3 46.3  MCV 89.7 89.6  PLT 141*  142*    Basic Metabolic Panel: Recent Labs  Lab 06/11/21 1338 06/12/21 0200 06/13/21 0104 06/14/21 0054 06/14/21 0644  NA 142 141 140 134* 139  K 3.5 3.5 4.0 3.9 4.4  CL 103 105 105 101 103  CO2 28 25 25 23 28   GLUCOSE 88 71 109* 147* 83  BUN 35* 38* 44* 55* 54*  CREATININE 2.48* 2.56* 2.60* 2.67* 2.81*  CALCIUM 8.7* 8.8* 8.6* 8.5* 9.2  MG  --   --   --   --  2.3    GFR: Estimated Creatinine Clearance: 33.2 mL/min (A) (by C-G formula based on SCr of 2.81 mg/dL (H)). Liver Function Tests: Recent Labs  Lab 06/11/21 1338  AST 13*  ALT 10  ALKPHOS 100  BILITOT 1.0  PROT 6.5  ALBUMIN 3.6    No results for input(s): LIPASE, AMYLASE in the  last 168 hours. No results for input(s): AMMONIA in the last 168 hours. Coagulation Profile: No results for input(s): INR, PROTIME in the last 168 hours. Cardiac Enzymes: No results for input(s): CKTOTAL, CKMB, CKMBINDEX, TROPONINI in the last 168 hours. BNP (last 3 results) No results for input(s): PROBNP in the last 8760 hours. HbA1C: No results for input(s): HGBA1C in the last 72 hours. CBG: Recent Labs  Lab 06/13/21 0628 06/13/21 1132 06/13/21 1650 06/13/21 2115 06/14/21 0608  GLUCAP 137* 125* 158* 143* 91    Lipid Profile: No results for input(s): CHOL, HDL, LDLCALC, TRIG, CHOLHDL, LDLDIRECT in the last 72 hours. Thyroid Function Tests: No results for input(s): TSH, T4TOTAL, FREET4, T3FREE, THYROIDAB in the last 72 hours. Anemia Panel: No results for input(s): VITAMINB12, FOLATE, FERRITIN, TIBC, IRON, RETICCTPCT in the last 72 hours. Sepsis Labs: No results for input(s): PROCALCITON, LATICACIDVEN in the last 168 hours.  Recent Results (from the past 240 hour(s))  Resp Panel by RT-PCR (Flu A&B, Covid) Nasopharyngeal Swab     Status: None   Collection Time: 06/04/21  6:48 PM   Specimen: Nasopharyngeal Swab; Nasopharyngeal(NP) swabs in vial transport medium  Result Value Ref Range Status   SARS Coronavirus 2 by RT  PCR NEGATIVE NEGATIVE Final    Comment: (NOTE) SARS-CoV-2 target nucleic acids are NOT DETECTED.  The SARS-CoV-2 RNA is generally detectable in upper respiratory specimens during the acute phase of infection. The lowest concentration of SARS-CoV-2 viral copies this assay can detect is 138 copies/mL. A negative result does not preclude SARS-Cov-2 infection and should not be used as the sole basis for treatment or other patient management decisions. A negative result may occur with  improper specimen collection/handling, submission of specimen other than nasopharyngeal swab, presence of viral mutation(s) within the areas targeted by this assay, and inadequate number of viral copies(<138 copies/mL). A negative result must be combined with clinical observations, patient history, and epidemiological information. The expected result is Negative.  Fact Sheet for Patients:  EntrepreneurPulse.com.au  Fact Sheet for Healthcare Providers:  IncredibleEmployment.be  This test is no t yet approved or cleared by the [place] FDA and  has been authorized for detection and/or diagnosis of SARS-CoV-2 by FDA under an Emergency Use Authorization (EUA). This EUA will remain  in effect (meaning this test can be used) for the duration of the COVID-19 declaration under Section 564(b)(1) of the Act, 21 U.S.C.section 360bbb-3(b)(1), unless the authorization is terminated  or revoked sooner.       Influenza A by PCR NEGATIVE NEGATIVE Final   Influenza B by PCR NEGATIVE NEGATIVE Final    Comment: (NOTE) The Xpert Xpress SARS-CoV-2/FLU/RSV plus assay is intended as an aid in the diagnosis of influenza from Nasopharyngeal swab specimens and should not be used as a sole basis for treatment. Nasal washings and aspirates are unacceptable for Xpert Xpress SARS-CoV-2/FLU/RSV testing.  Fact Sheet for Patients: EntrepreneurPulse.com.au  Fact Sheet for  Healthcare Providers: IncredibleEmployment.be  This test is not yet approved or cleared by the [place] FDA and has been authorized for detection and/or diagnosis of SARS-CoV-2 by FDA under an Emergency Use Authorization (EUA). This EUA will remain in effect (meaning this test can be used) for the duration of the COVID-19 declaration under Section 564(b)(1) of the Act, 21 U.S.C. section 360bbb-3(b)(1), unless the authorization is terminated or revoked.  Performed at [facility], [address], [place], [place] [address]   Resp Panel by RT-PCR (Flu A&B, Covid) Nasopharyngeal Swab  Status: None   Collection Time: 06/11/21  5:29 PM   Specimen: Nasopharyngeal Swab; Nasopharyngeal(NP) swabs in vial transport medium  Result Value Ref Range Status   SARS Coronavirus 2 by RT PCR NEGATIVE NEGATIVE Final    Comment: (NOTE) SARS-CoV-2 target nucleic acids are NOT DETECTED.  The SARS-CoV-2 RNA is generally detectable in upper respiratory specimens during the acute phase of infection. The lowest concentration of SARS-CoV-2 viral copies this assay can detect is 138 copies/mL. A negative result does not preclude SARS-Cov-2 infection and should not be used as the sole basis for treatment or other patient management decisions. A negative result may occur with  improper specimen collection/handling, submission of specimen other than nasopharyngeal swab, presence of viral mutation(s) within the areas targeted by this assay, and inadequate number of viral copies(<138 copies/mL). A negative result must be combined with clinical observations, patient history, and epidemiological information. The expected result is Negative.  Fact Sheet for Patients:  EntrepreneurPulse.com.au  Fact Sheet for Healthcare Providers:  IncredibleEmployment.be  This test is no t yet approved or cleared by the [place] FDA and  has been authorized for  detection and/or diagnosis of SARS-CoV-2 by FDA under an Emergency Use Authorization (EUA). This EUA will remain  in effect (meaning this test can be used) for the duration of the COVID-19 declaration under Section 564(b)(1) of the Act, 21 U.S.C.section 360bbb-3(b)(1), unless the authorization is terminated  or revoked sooner.       Influenza A by PCR NEGATIVE NEGATIVE Final   Influenza B by PCR NEGATIVE NEGATIVE Final    Comment: (NOTE) The Xpert Xpress SARS-CoV-2/FLU/RSV plus assay is intended as an aid in the diagnosis of influenza from Nasopharyngeal swab specimens and should not be used as a sole basis for treatment. Nasal washings and aspirates are unacceptable for Xpert Xpress SARS-CoV-2/FLU/RSV testing.  Fact Sheet for Patients: EntrepreneurPulse.com.au  Fact Sheet for Healthcare Providers: IncredibleEmployment.be  This test is not yet approved or cleared by the [place] FDA and has been authorized for detection and/or diagnosis of SARS-CoV-2 by FDA under an Emergency Use Authorization (EUA). This EUA will remain in effect (meaning this test can be used) for the duration of the COVID-19 declaration under Section 564(b)(1) of the Act, 21 U.S.C. section 360bbb-3(b)(1), unless the authorization is terminated or revoked.  Performed at [facility], [place] [address], [place], [place] [address]   MRSA Next Gen by PCR, Nasal     Status: None   Collection Time: 06/11/21  7:01 PM   Specimen: Nasal Mucosa; Nasal Swab  Result Value Ref Range Status   MRSA by PCR Next Gen NOT DETECTED NOT DETECTED Final    Comment: (NOTE) The GeneXpert MRSA Assay (FDA approved for NASAL specimens only), is one component of a comprehensive MRSA colonization surveillance program. It is not intended to diagnose MRSA infection nor to guide or monitor treatment for MRSA infections. Test performance is not FDA approved in patients less than 39  years old. Performed at [facility], [place] [address], [place], [place] [address]        Radiology Studies: No results found.  Scheduled Meds:  apixaban  5 mg Oral BID   bisoprolol  5 mg Oral Daily   docusate sodium  100 mg Oral BID   furosemide  80 mg Intravenous BID   insulin aspart  0-15 Units Subcutaneous TID WC   insulin aspart  0-5 Units Subcutaneous QHS   insulin glargine-yfgn  26 Units  Subcutaneous Q2200   ipratropium-albuterol  3 mL Nebulization BID   isosorbide mononitrate  30 mg Oral Daily   levothyroxine  300 mcg Oral QAC breakfast   loratadine  5 mg Oral Daily   mometasone-formoterol  2 puff Inhalation BID   nutrition supplement (JUVEN)  1 packet Oral BID BM   predniSONE  40 mg Oral Q breakfast   simvastatin  5 mg Oral Daily   sodium chloride flush  3 mL Intravenous Q12H   Continuous Infusions:   LOS: 2 days   Time spent: 28 minutes   Darliss Cheney, MD [facility]  06/14/2021, 9:51 AM  Please page via [place] and do not message via secure chat for anything urgent. Secure chat can be used for anything non urgent.  How to contact the [facility] Attending or Consulting provider [place] or covering provider during after hours [place], for this patient?  Check the care team in [facility] and look for a) attending/consulting [facility] provider listed and b) the [facility] team listed. Page or secure chat [phone]. Log into [email or web address] and use [place]'s universal password to access. If you do not have the password, please contact the hospital operator. Locate the [facility] provider you are looking for under [facility] and page to a number that you can be directly reached. If you still have difficulty reaching the provider, please page the [facility] (Director on Call) for the Hospitalists listed on amion for assistance.

## 2021-06-14 NOTE — Progress Notes (Signed)
RT placed patient on CPAP HS. 4L O2 bleed in needed. Patient tolerating well at this time.  

## 2021-06-15 ENCOUNTER — Encounter (HOSPITAL_COMMUNITY): Payer: Medicare HMO

## 2021-06-15 DIAGNOSIS — I5033 Acute on chronic diastolic (congestive) heart failure: Secondary | ICD-10-CM | POA: Diagnosis not present

## 2021-06-15 LAB — BASIC METABOLIC PANEL
Anion gap: 10 (ref 5–15)
BUN: 60 mg/dL — ABNORMAL HIGH (ref 8–23)
CO2: 26 mmol/L (ref 22–32)
Calcium: 8.6 mg/dL — ABNORMAL LOW (ref 8.9–10.3)
Chloride: 99 mmol/L (ref 98–111)
Creatinine, Ser: 2.61 mg/dL — ABNORMAL HIGH (ref 0.61–1.24)
GFR, Estimated: 25 mL/min — ABNORMAL LOW (ref >60)
Glucose, Bld: 81 mg/dL (ref 70–99)
Potassium: 3.6 mmol/L (ref 3.5–5.1)
Sodium: 135 mmol/L (ref 135–145)

## 2021-06-15 LAB — GLUCOSE, CAPILLARY
Glucose-Capillary: 79 mg/dL (ref 70–99)
Glucose-Capillary: 101 mg/dL — ABNORMAL HIGH (ref 70–99)
Glucose-Capillary: 173 mg/dL — ABNORMAL HIGH (ref 70–99)
Glucose-Capillary: 112 mg/dL — ABNORMAL HIGH (ref 70–99)

## 2021-06-15 MED ORDER — LOPERAMIDE HCL 2 MG PO CAPS
2.0000 mg | ORAL_CAPSULE | ORAL | Status: DC | PRN
  Administered 2021-06-15 – 2021-06-25 (×3): 2 mg via ORAL
  Filled 2021-06-15 (×4): qty 1

## 2021-06-15 NOTE — Progress Notes (Signed)
PROGRESS NOTE    [name] [name]  OEU:[id number] DOB: 1945-09-20 DOA: 06/11/2021 PCP: Nickola Major, MD   Brief Narrative:  HPI: [name] [name] is a 75 y.o. male with medical history significant of afib; CAD s/p stent; stage 3 CKD; HTN; HLD; COPD; hypothyroidism; morbid obesity; NHL treated with XRT and chemotherapy); DM; and OSA presenting with SOB.  He was last seen in the ER on 11/2 for SOB and LE edema; this was thought to be multifactorial due to COPD and CHF and he was given steroids, antibiotics, and an inhaler and discharged with ongoing dosing of current Torsemide dose.  He reports that he has CHF and was going to see the doctor today and he noticed he was swelling up.  He was using his home O2 but was still feeling SOB.  Symptoms started yesterday and worsened today.  He wears 3-4L at home.  Some cough, nonproductive. He does not lie flat.  Occasional PND, unchanged from prior.  He sleeps in a recliner.  His weight was up about 9 pounds today, he did not check his weight yesterday.  He misses 2-3 doses a week of medications.       ED Course: Fluid overlloaded.  Went to pulm on home 4L and in 60s - placed on NRB and now stable on home 4L.  Compliant with Torsemide but increased edema and +pulm edema.  Labs better than prior.    Assessment & Plan:   Principal Problem:   Acute on chronic diastolic CHF (congestive heart failure) (HCC) Active Problems:   Chronic kidney disease Stage IV   Morbid obesity (HCC)   Type 2 diabetes mellitus with stage 4 chronic kidney disease (HCC)   Hypothyroidism   Benign essential hypertension   AF (paroxysmal atrial fibrillation) (HCC)   Chronic respiratory failure with hypoxia and hypercapnia (HCC)   CHF (congestive heart failure) (HCC)  Chronic hypoxic respiratory failure/acute on chronic diastolic CHF: Echo 1/54/0086 shows 55 to 60% ejection fraction.  Uses 3 to 4 L of oxygen at home, currently on 3 L.  Despite of high dose of Lasix, urine output  not impressive.  Total 1200 cc in last 24 hours and net negative about 2.8 L since admission.  Interestingly patient's weight has been charted as 138.1 kg since admission.  I question reliability.  I have relayed my concerns to patient's primary nurse.  They will recheck the weight.  However on exam, patient still has significant lower extremity edema and appears fluid overloaded.  We will continue Lasix 80 mg IV twice daily with strict I's and O's, daily weight and low-sodium diet.  We will further restrict fluid to 1200 cc.   COPD with possible acute exacerbation: No more wheezes.  Completed 5 days of prednisone.   Possible OSA: Patient tells me that he had sleep study done several years ago but was never prescribed CPAP machine.  He tells me that he is under the impression that he is scheduled to have sleep study as outpatient.  I advised him to make sure that he is otherwise scheduled 1 for him.  Continue CPAP while he is here.    Permanent atrial fibrillation: Rates controlled.  Continue bisoprolol and Eliquis.   HTN: Controlled.  Continue bisoprolol and as needed hydralazine.  HLD -Continue Zocor   DM type II: -Last A1c was 5.8, indicating good control.  Blood sugar controlled with current dose of Tresiba and SSI.  CAD -s/p stent -Continue Imdur.  Currently asymptomatic.  Hypothyroidism: Continue Synthroid.   Morbid obesity -BMI is 41.4  -Weight loss encouraged. -Outpatient PCP/bariatric medicine f/u encouraged    NHL  -treated with XRT and chemotherapy -He is now doing annual f/u, due again in 09/2021   Stage 4 CKD: At baseline.  Chronic bilateral lower extremity wounds: Patient manages them himself at home.  Nursing to manage them while here.  Unna boot ordered.  Diarrhea: Imodium as needed.  No signs of infection.  No suspicion of C. difficile.  DVT prophylaxis: Place TED hose Start: 06/14/21 0958   Code Status: Full Code  Family Communication:  None present at  bedside.  Plan of care discussed with patient in length and he verbalized understanding and agreed with it.  Status is: Inpatient  Remains inpatient appropriate because: Needs more IV diuretics.  Estimated body mass index is 40.49 kg/m as calculated from the following:   Height as of this encounter: 6\' 1"  (1.854 m).   Weight as of this encounter: 139.2 kg.     Nutritional Assessment: Body mass index is 40.49 kg/m.Marland Kitchen Seen by dietician.  I agree with the assessment and plan as outlined below: Nutrition Status: Nutrition Problem: Increased nutrient needs Etiology: chronic illness (acute on chronic CHF) Signs/Symptoms: estimated needs Interventions: Liberalize Diet  .  Skin Assessment: I have examined the patient's skin and I agree with the wound assessment as performed by the wound care RN as outlined below:    Consultants:  None  Procedures:  None  Antimicrobials:  Anti-infectives (From admission, onward)    None          Subjective: Patient seen and examined.  Now he complains of diarrhea and is requesting Imodium.  No shortness of breath.  Objective: Vitals:   06/15/21 0601 06/15/21 0724 06/15/21 0740 06/15/21 0820  BP:   129/81   Pulse: 60 60 (!) 54 61  Resp: 20 20 (!) 23 19  Temp:   97.8 F (36.6 C)   TempSrc:   Oral   SpO2:  94% 94% 95%  Weight:    (!) 139.2 kg  Height:    6\' 1"  (1.854 m)    Intake/Output Summary (Last 24 hours) at 06/15/2021 1021 Last data filed at 06/15/2021 0900 Gross per 24 hour  Intake 4513 ml  Output 1400 ml  Net 3113 ml    Filed Weights   06/15/21 0355 06/15/21 0500 06/15/21 0820  Weight: (!) 138.1 kg (!) 138.1 kg (!) 139.2 kg    Examination:  General exam: Appears calm and comfortable, obese Respiratory system: Clear to auscultation. Respiratory effort normal. Cardiovascular system: S1 & S2 heard, RRR. No JVD, murmurs, rubs, gallops or clicks.  +3 pitting edema bilateral lower extremity. Gastrointestinal  system: Abdomen is nondistended, soft and nontender. No organomegaly or masses felt. Normal bowel sounds heard. Central nervous system: Alert and oriented. No focal neurological deficits. Extremities: Symmetric 5 x 5 power. Skin: Chronic wounds bilateral lower extremities. Psychiatry: Judgement and insight appear normal. Mood & affect appropriate.   Data Reviewed: I have personally reviewed following labs and imaging studies  CBC: Recent Labs  Lab 06/11/21 1338 06/12/21 0200  WBC 8.4 7.1  NEUTROABS 6.3  --   HGB 14.0 14.4  HCT 44.3 46.3  MCV 89.7 89.6  PLT 141* 142*    Basic Metabolic Panel: Recent Labs  Lab 06/12/21 0200 06/13/21 0104 06/14/21 0054 06/14/21 0644 06/15/21 0121  NA 141 140 134* 139 135  K 3.5 4.0 3.9 4.4 3.6  CL  105 105 101 103 99  CO2 25 25 23 28 26   GLUCOSE 71 109* 147* 83 81  BUN 38* 44* 55* 54* 60*  CREATININE 2.56* 2.60* 2.67* 2.81* 2.61*  CALCIUM 8.8* 8.6* 8.5* 9.2 8.6*  MG  --   --   --  2.3  --     GFR: Estimated Creatinine Clearance: 36.4 mL/min (A) (by C-G formula based on SCr of 2.61 mg/dL (H)). Liver Function Tests: Recent Labs  Lab 06/11/21 1338  AST 13*  ALT 10  ALKPHOS 100  BILITOT 1.0  PROT 6.5  ALBUMIN 3.6    No results for input(s): LIPASE, AMYLASE in the last 168 hours. No results for input(s): AMMONIA in the last 168 hours. Coagulation Profile: No results for input(s): INR, PROTIME in the last 168 hours. Cardiac Enzymes: No results for input(s): CKTOTAL, CKMB, CKMBINDEX, TROPONINI in the last 168 hours. BNP (last 3 results) No results for input(s): PROBNP in the last 8760 hours. HbA1C: No results for input(s): HGBA1C in the last 72 hours. CBG: Recent Labs  Lab 06/14/21 0608 06/14/21 1116 06/14/21 1627 06/14/21 2125 06/15/21 0653  GLUCAP 91 122* 143* 317* 79    Lipid Profile: No results for input(s): CHOL, HDL, LDLCALC, TRIG, CHOLHDL, LDLDIRECT in the last 72 hours. Thyroid Function Tests: No results for  input(s): TSH, T4TOTAL, FREET4, T3FREE, THYROIDAB in the last 72 hours. Anemia Panel: No results for input(s): VITAMINB12, FOLATE, FERRITIN, TIBC, IRON, RETICCTPCT in the last 72 hours. Sepsis Labs: No results for input(s): PROCALCITON, LATICACIDVEN in the last 168 hours.  Recent Results (from the past 240 hour(s))  Resp Panel by RT-PCR (Flu A&B, Covid) Nasopharyngeal Swab     Status: None   Collection Time: 06/11/21  5:29 PM   Specimen: Nasopharyngeal Swab; Nasopharyngeal(NP) swabs in vial transport medium  Result Value Ref Range Status   SARS Coronavirus 2 by RT PCR NEGATIVE NEGATIVE Final    Comment: (NOTE) SARS-CoV-2 target nucleic acids are NOT DETECTED.  The SARS-CoV-2 RNA is generally detectable in upper respiratory specimens during the acute phase of infection. The lowest concentration of SARS-CoV-2 viral copies this assay can detect is 138 copies/mL. A negative result does not preclude SARS-Cov-2 infection and should not be used as the sole basis for treatment or other patient management decisions. A negative result may occur with  improper specimen collection/handling, submission of specimen other than nasopharyngeal swab, presence of viral mutation(s) within the areas targeted by this assay, and inadequate number of viral copies(<138 copies/mL). A negative result must be combined with clinical observations, patient history, and epidemiological information. The expected result is Negative.  Fact Sheet for Patients:  EntrepreneurPulse.com.au  Fact Sheet for Healthcare Providers:  IncredibleEmployment.be  This test is no t yet approved or cleared by the [place] FDA and  has been authorized for detection and/or diagnosis of SARS-CoV-2 by FDA under an Emergency Use Authorization (EUA). This EUA will remain  in effect (meaning this test can be used) for the duration of the COVID-19 declaration under Section 564(b)(1) of the Act,  21 U.S.C.section 360bbb-3(b)(1), unless the authorization is terminated  or revoked sooner.       Influenza A by PCR NEGATIVE NEGATIVE Final   Influenza B by PCR NEGATIVE NEGATIVE Final    Comment: (NOTE) The Xpert Xpress SARS-CoV-2/FLU/RSV plus assay is intended as an aid in the diagnosis of influenza from Nasopharyngeal swab specimens and should not be used as a sole basis for treatment. Nasal washings and aspirates  are unacceptable for Xpert Xpress SARS-CoV-2/FLU/RSV testing.  Fact Sheet for Patients: EntrepreneurPulse.com.au  Fact Sheet for Healthcare Providers: IncredibleEmployment.be  This test is not yet approved or cleared by the [place] FDA and has been authorized for detection and/or diagnosis of SARS-CoV-2 by FDA under an Emergency Use Authorization (EUA). This EUA will remain in effect (meaning this test can be used) for the duration of the COVID-19 declaration under Section 564(b)(1) of the Act, 21 U.S.C. section 360bbb-3(b)(1), unless the authorization is terminated or revoked.  Performed at [facility], [place] [address], [place], [place] [address]   MRSA Next Gen by PCR, Nasal     Status: None   Collection Time: 06/11/21  7:01 PM   Specimen: Nasal Mucosa; Nasal Swab  Result Value Ref Range Status   MRSA by PCR Next Gen NOT DETECTED NOT DETECTED Final    Comment: (NOTE) The GeneXpert MRSA Assay (FDA approved for NASAL specimens only), is one component of a comprehensive MRSA colonization surveillance program. It is not intended to diagnose MRSA infection nor to guide or monitor treatment for MRSA infections. Test performance is not FDA approved in patients less than 65 years old. Performed at [facility], [place] [address] [place], [place] [address]        Radiology Studies: No results found.  Scheduled Meds:  apixaban  5 mg Oral BID   bisoprolol  5 mg Oral Daily   docusate sodium  100 mg Oral BID    furosemide  80 mg Intravenous BID   insulin aspart  0-15 Units Subcutaneous TID WC   insulin aspart  0-5 Units Subcutaneous QHS   insulin glargine-yfgn  26 Units Subcutaneous Q2200   ipratropium-albuterol  3 mL Nebulization BID   isosorbide mononitrate  30 mg Oral Daily   levothyroxine  300 mcg Oral QAC breakfast   loratadine  5 mg Oral Daily   mometasone-formoterol  2 puff Inhalation BID   nutrition supplement (JUVEN)  1 packet Oral BID BM   predniSONE  40 mg Oral Q breakfast   simvastatin  5 mg Oral Daily   sodium chloride flush  3 mL Intravenous Q12H   Continuous Infusions:   LOS: 3 days   Time spent: 28 minutes   Darliss Cheney, MD [facility]  06/15/2021, 10:21 AM  Please page via Shea Evans and do not message via secure chat for anything urgent. Secure chat can be used for anything non urgent.  How to contact the [facility] Attending or Consulting provider [place] or covering provider during after hours [place], for this patient?  Check the care team in [facility] and look for a) attending/consulting TRH provider listed and b) the [facility] team listed. Page or secure chat [phone]. Log into [email or web address] and use [place]'s universal password to access. If you do not have the password, please contact the hospital operator. Locate the [facility] provider you are looking for under [facility] and page to a number that you can be directly reached. If you still have difficulty reaching the provider, please page the [facility] (Director on Call) for the Hospitalists listed on amion for assistance.

## 2021-06-15 NOTE — Progress Notes (Signed)
Mobility Specialist Progress Note    06/15/21 1637  Mobility  Activity Ambulated in hall  Level of Assistance Modified independent, requires aide device or extra time  Assistive Device Four wheel walker  Distance Ambulated (ft) 160 ft (80+80)  Mobility Ambulated with assistance in hallway  Mobility Response Tolerated fair  Mobility performed by Mobility specialist  Bed Position Chair  $Mobility charge 1 Mobility   Pt received in chair and agreeable. Took x1 seated break in room between bouts for about 4-5 minutes to recover. Ambulated on 6LO2. Pt left in chair with call bell in reach.   Hildred Alamin Mobility Specialist  Mobility Specialist Phone: [phone]

## 2021-06-16 DIAGNOSIS — I5033 Acute on chronic diastolic (congestive) heart failure: Secondary | ICD-10-CM | POA: Diagnosis not present

## 2021-06-16 LAB — BASIC METABOLIC PANEL
Anion gap: 11 (ref 5–15)
BUN: 67 mg/dL — ABNORMAL HIGH (ref 8–23)
CO2: 23 mmol/L (ref 22–32)
Calcium: 8.6 mg/dL — ABNORMAL LOW (ref 8.9–10.3)
Chloride: 102 mmol/L (ref 98–111)
Creatinine, Ser: 2.48 mg/dL — ABNORMAL HIGH (ref 0.61–1.24)
GFR, Estimated: 27 mL/min — ABNORMAL LOW (ref >60)
Glucose, Bld: 128 mg/dL — ABNORMAL HIGH (ref 70–99)
Potassium: 4.6 mmol/L (ref 3.5–5.1)
Sodium: 136 mmol/L (ref 135–145)

## 2021-06-16 LAB — GLUCOSE, CAPILLARY
Glucose-Capillary: 137 mg/dL — ABNORMAL HIGH (ref 70–99)
Glucose-Capillary: 156 mg/dL — ABNORMAL HIGH (ref 70–99)
Glucose-Capillary: 65 mg/dL — ABNORMAL LOW (ref 70–99)
Glucose-Capillary: 97 mg/dL (ref 70–99)
Glucose-Capillary: 87 mg/dL (ref 70–99)

## 2021-06-16 NOTE — Progress Notes (Signed)
Heart Failure Nurse Navigator Progress Note  Pt continues to state he does not want HV TOC appt as he lives in [place]--pt is enrolled in [employer]. Encouraged he would have a ride, pt declined. Pt had appt scheduled 11/11 with Levell July, PA [facility], and Dr. Domenic Polite in January. Encouraged pt to call [facility] office when he knows his DC date to reschedule follow up appt.   Pt resting in recliner with legs elevated, TED hose on--pt legs look much better, definition of ankle notable. Pt worked with Risk analyst and PT today walking in halls.   Pricilla Holm, MSN, RN Heart Failure Nurse Navigator [phone]

## 2021-06-16 NOTE — Progress Notes (Signed)
Occupational Therapy Treatment Patient Details Name: [name] [name] MRN: [id number] DOB: Dec 29, 1945 Today's Date: 06/16/2021   History of present illness Pt is a 75 yo male presenting 06/11/21 with LE edema and SOB; workup for CHF exacerbation. PMH includes: afib, CAD, CKD III, HTN, HLD, LBBB, NSTEMI (2018), DM II, sleep apnea (sleeps in recliner), morbid obesity, and neuropathy. Uses 3-4L O2 at baseline.   OT comments  Patient continues to make progress towards goals in skilled OT session. Patient's session encompassed UE exercise program with theraband. Patient able to demonstrate with good pacing through exercises, having to take approximately 30-45 seconds between sets due to fatigue. Patient educated on further exercises but declined repeating further repetitions. Discharge remains appropriate, therapy will continue to follow.   Recommendations for follow up therapy are one component of a multi-disciplinary discharge planning process, led by the attending physician.  Recommendations may be updated based on patient status, additional functional criteria and insurance authorization.    Follow Up Recommendations  No OT follow up    Assistance Recommended at Discharge Set up Supervision/Assistance  Equipment Recommendations  Tub/shower seat (reports his current shower chair is worn out)    Recommendations for Other Services      Precautions / Restrictions Precautions Precautions: None Precaution Comments: monitor O2 (wears 3-4 L O2 at baseline) Restrictions Weight Bearing Restrictions: No       Mobility Bed Mobility               General bed mobility comments: pt received in recliner    Transfers                         Balance                                           ADL either performed or assessed with clinical judgement   ADL                                         General ADL Comments: Session focus on UE  exercises to promote increased independence for discharge home    Extremity/Trunk Assessment              Vision       Perception     Praxis      Cognition Arousal/Alertness: Awake/alert Behavior During Therapy: WFL for tasks assessed/performed Overall Cognitive Status: Within Functional Limits for tasks assessed                                            Exercises General Exercises - Upper Extremity Shoulder Flexion: AROM;Both;20 reps;Seated;Theraband Theraband Level (Shoulder Flexion): Level 2 (Red) Shoulder Extension: AROM;Both;20 reps;Seated;Theraband Theraband Level (Shoulder Extension): Level 2 (Red) Elbow Flexion: AROM;Both;20 reps;Seated;Theraband Theraband Level (Elbow Flexion): Level 2 (Red) Elbow Extension: AROM;Both;20 reps;Seated;Theraband Theraband Level (Elbow Extension): Level 2 (Red) Other Exercises Other Exercises: educated on bicep, shoudler exercises to complete in home  (pt declining further exercises due to fatigue) Other Exercises: serratus punches 1 set of 10 per arm   Shoulder Instructions       General Comments      Pertinent Vitals/ Pain  Pain Assessment: No/denies pain  Home Living                                          Prior Functioning/Environment              Frequency  Min 2X/week        Progress Toward Goals  OT Goals(current goals can now be found in the care plan section)  Progress towards OT goals: Progressing toward goals  Acute Rehab OT Goals Patient Stated Goal: go home OT Goal Formulation: With patient Time For Goal Achievement: 06/26/21 Potential to Achieve Goals: Good  Plan Discharge plan remains appropriate    Co-evaluation                 AM-PAC OT "6 Clicks" Daily Activity     Outcome Measure   Help from another person eating meals?: None Help from another person taking care of personal grooming?: A Little Help from another person toileting,  which includes using toliet, bedpan, or urinal?: A Little Help from another person bathing (including washing, rinsing, drying)?: A Little Help from another person to put on and taking off regular upper body clothing?: A Little Help from another person to put on and taking off regular lower body clothing?: A Little 6 Click Score: 19    End of Session Equipment Utilized During Treatment: Oxygen;Other (comment) (theraband)  OT Visit Diagnosis: Other (comment) (decreased caridiopulmonary tolerance)   Activity Tolerance Patient tolerated treatment well   Patient Left in chair;with call bell/phone within reach   Nurse Communication Mobility status        Time: 5277-8242 OT Time Calculation (min): 12 min  Charges: OT General Charges $OT Visit: 1 Visit OT Treatments $Therapeutic Exercise: 8-22 mins  Corinne Ports E. Juanelle Trueheart, COTA/L Acute Rehabilitation Services [place] 06/16/2021, 12:58 PM

## 2021-06-16 NOTE — Progress Notes (Signed)
Mobility Specialist Progress Note    06/16/21 1018  Mobility  Activity Ambulated in hall  Level of Assistance Modified independent, requires aide device or extra time  Eek wheel walker  Distance Ambulated (ft) 110 ft (55+55)  Mobility Ambulated with assistance in hallway  Mobility Response Tolerated fair  Mobility performed by Mobility specialist  Bed Position Chair  $Mobility charge 1 Mobility   Pt received in chair and agreeable. Took x1 seated break for about 3-4 minutes to recover breathing. Returned to chair with call bell in reach.   Hildred Alamin Mobility Specialist  Mobility Specialist Phone: [phone]

## 2021-06-16 NOTE — Progress Notes (Signed)
PROGRESS NOTE    [name] [name]  ZOX:[id number] DOB: 1945-12-01 DOA: 06/11/2021 PCP: Nickola Major, MD   Brief Narrative:  HPI: [name] [name] is a 75 y.o. male with medical history significant of afib; CAD s/p stent; stage 3 CKD; HTN; HLD; COPD; hypothyroidism; morbid obesity; NHL treated with XRT and chemotherapy); DM; and OSA presenting with SOB.  He was last seen in the ER on 11/2 for SOB and LE edema; this was thought to be multifactorial due to COPD and CHF and he was given steroids, antibiotics, and an inhaler and discharged with ongoing dosing of current Torsemide dose.  He reports that he has CHF and was going to see the doctor today and he noticed he was swelling up.  He was using his home O2 but was still feeling SOB.  Symptoms started yesterday and worsened today.  He wears 3-4L at home.  Some cough, nonproductive. He does not lie flat.  Occasional PND, unchanged from prior.  He sleeps in a recliner.  His weight was up about 9 pounds today, he did not check his weight yesterday.  He misses 2-3 doses a week of medications.       ED Course: Fluid overlloaded.  Went to pulm on home 4L and in 60s - placed on NRB and now stable on home 4L.  Compliant with Torsemide but increased edema and +pulm edema.  Labs better than prior.    Assessment & Plan:   Principal Problem:   Acute on chronic diastolic CHF (congestive heart failure) (HCC) Active Problems:   Chronic kidney disease Stage IV   Morbid obesity (HCC)   Type 2 diabetes mellitus with stage 4 chronic kidney disease (HCC)   Hypothyroidism   Benign essential hypertension   AF (paroxysmal atrial fibrillation) (HCC)   Chronic respiratory failure with hypoxia and hypercapnia (HCC)   CHF (congestive heart failure) (HCC)  Chronic hypoxic respiratory failure/acute on chronic diastolic CHF: Echo 03/13/9146 shows 55 to 60% ejection fraction.  Uses 3 to 4 L of oxygen at home, currently on 4 L.  Despite of high dose of Lasix, urine output  not impressive.  Total 1200 cc in last 24 hours and net negative about 2.8 L since admission.  Interestingly patient's weight has been charted as 138.1 kg since admission and there has been no loss of weight documented.  Not sure how authentic that is.  Also, despite of aggressive IV diuresis, he has had only 550 cc urine output since yesterday.  Again, not sure how accurate that is but despite of that, patient's lower extremity edema has improved somewhat.  Could very well be the benefit of TED hose.  We will continue aggressive diuresis for 1 more day.  Reassess tomorrow.  Getting closer to discharge.   COPD with possible acute exacerbation: No more wheezes.  Completed 5 days of prednisone.   Possible OSA: Patient tells me that he had sleep study done several years ago but was never prescribed CPAP machine.  He tells me that he is under the impression that he is scheduled to have sleep study as outpatient.  I advised him to make sure that he is otherwise scheduled 1 for him.  Continue CPAP while he is here.    Permanent atrial fibrillation: Rates controlled.  Continue bisoprolol and Eliquis.   HTN: Controlled.  Continue bisoprolol and as needed hydralazine.  HLD -Continue Zocor   DM type II: -Last A1c was 5.8, indicating good control.  Blood sugar controlled  with current dose of Tresiba and SSI.  CAD -s/p stent -Continue Imdur.  Currently asymptomatic.   Hypothyroidism: Continue Synthroid.   Morbid obesity -BMI is 41.4  -Weight loss encouraged. -Outpatient PCP/bariatric medicine f/u encouraged    NHL  -treated with XRT and chemotherapy -He is now doing annual f/u, due again in 09/2021   Stage 4 CKD: At baseline.  Chronic bilateral lower extremity wounds: Patient manages them himself at home.  Nursing to manage them while here.  Unna boot ordered.  Diarrhea: Imodium as needed.  No signs of infection.  No suspicion of C. difficile.  DVT prophylaxis: Place TED hose Start: 06/14/21  0958   Code Status: Full Code  Family Communication:  None present at bedside.  Plan of care discussed with patient in length and he verbalized understanding and agreed with it.  Status is: Inpatient  Remains inpatient appropriate because: Needs more IV diuretics.  Estimated body mass index is 40.14 kg/m as calculated from the following:   Height as of this encounter: 6\' 1"  (1.854 m).   Weight as of this encounter: 138 kg.     Nutritional Assessment: Body mass index is 40.14 kg/m.Marland Kitchen Seen by dietician.  I agree with the assessment and plan as outlined below: Nutrition Status: Nutrition Problem: Increased nutrient needs Etiology: chronic illness (acute on chronic CHF) Signs/Symptoms: estimated needs Interventions: Liberalize Diet  .  Skin Assessment: I have examined the patient's skin and I agree with the wound assessment as performed by the wound care RN as outlined below:    Consultants:  None  Procedures:  None  Antimicrobials:  Anti-infectives (From admission, onward)    None          Subjective:  Patient seen and examined.  No complaints.  No shortness of breath.  His breathing is at baseline.  He tells me that his lower extremity edema is getting closer to his baseline as well.  Objective: Vitals:   06/16/21 0358 06/16/21 0519 06/16/21 0722 06/16/21 0728  BP: 131/69  127/78   Pulse: 62  60 64  Resp: 18  12 (!) 21  Temp: (!) 97.2 F (36.2 C)  (!) 97.2 F (36.2 C)   TempSrc: Oral     SpO2: 96%  93% 94%  Weight:  (!) 138 kg    Height:        Intake/Output Summary (Last 24 hours) at 06/16/2021 0958 Last data filed at 06/16/2021 0606 Gross per 24 hour  Intake 480 ml  Output 350 ml  Net 130 ml    Filed Weights   06/15/21 0500 06/15/21 0820 06/16/21 0519  Weight: (!) 138.1 kg (!) 139.2 kg (!) 138 kg    Examination:  General exam: Appears calm and comfortable, morbidly obese Respiratory system: Clear to auscultation. Respiratory effort  normal. Cardiovascular system: S1 & S2 heard, RRR. No JVD, murmurs, rubs, gallops or clicks.  +2 pitting edema bilateral lower extremity Gastrointestinal system: Abdomen is nondistended, soft and nontender. No organomegaly or masses felt. Normal bowel sounds heard. Central nervous system: Alert and oriented. No focal neurological deficits. Extremities: Symmetric 5 x 5 power. Skin: No rashes, lesions or ulcers.  Psychiatry: Judgement and insight appear normal. Mood & affect appropriate.    Data Reviewed: I have personally reviewed following labs and imaging studies  CBC: Recent Labs  Lab 06/11/21 1338 06/12/21 0200  WBC 8.4 7.1  NEUTROABS 6.3  --   HGB 14.0 14.4  HCT 44.3 46.3  MCV 89.7 89.6  PLT 141* 142*    Basic Metabolic Panel: Recent Labs  Lab 06/13/21 0104 06/14/21 0054 06/14/21 0644 06/15/21 0121 06/16/21 0313  NA 140 134* 139 135 136  K 4.0 3.9 4.4 3.6 4.6  CL 105 101 103 99 102  CO2 25 23 28 26 23   GLUCOSE 109* 147* 83 81 128*  BUN 44* 55* 54* 60* 67*  CREATININE 2.60* 2.67* 2.81* 2.61* 2.48*  CALCIUM 8.6* 8.5* 9.2 8.6* 8.6*  MG  --   --  2.3  --   --     GFR: Estimated Creatinine Clearance: 38.1 mL/min (A) (by C-G formula based on SCr of 2.48 mg/dL (H)). Liver Function Tests: Recent Labs  Lab 06/11/21 1338  AST 13*  ALT 10  ALKPHOS 100  BILITOT 1.0  PROT 6.5  ALBUMIN 3.6    No results for input(s): LIPASE, AMYLASE in the last 168 hours. No results for input(s): AMMONIA in the last 168 hours. Coagulation Profile: No results for input(s): INR, PROTIME in the last 168 hours. Cardiac Enzymes: No results for input(s): CKTOTAL, CKMB, CKMBINDEX, TROPONINI in the last 168 hours. BNP (last 3 results) No results for input(s): PROBNP in the last 8760 hours. HbA1C: No results for input(s): HGBA1C in the last 72 hours. CBG: Recent Labs  Lab 06/15/21 0653 06/15/21 1126 06/15/21 1558 06/15/21 2117 06/16/21 0645  GLUCAP 79 101* 173* 112* 137*     Lipid Profile: No results for input(s): CHOL, HDL, LDLCALC, TRIG, CHOLHDL, LDLDIRECT in the last 72 hours. Thyroid Function Tests: No results for input(s): TSH, T4TOTAL, FREET4, T3FREE, THYROIDAB in the last 72 hours. Anemia Panel: No results for input(s): VITAMINB12, FOLATE, FERRITIN, TIBC, IRON, RETICCTPCT in the last 72 hours. Sepsis Labs: No results for input(s): PROCALCITON, LATICACIDVEN in the last 168 hours.  Recent Results (from the past 240 hour(s))  Resp Panel by RT-PCR (Flu A&B, Covid) Nasopharyngeal Swab     Status: None   Collection Time: 06/11/21  5:29 PM   Specimen: Nasopharyngeal Swab; Nasopharyngeal(NP) swabs in vial transport medium  Result Value Ref Range Status   SARS Coronavirus 2 by RT PCR NEGATIVE NEGATIVE Final    Comment: (NOTE) SARS-CoV-2 target nucleic acids are NOT DETECTED.  The SARS-CoV-2 RNA is generally detectable in upper respiratory specimens during the acute phase of infection. The lowest concentration of SARS-CoV-2 viral copies this assay can detect is 138 copies/mL. A negative result does not preclude SARS-Cov-2 infection and should not be used as the sole basis for treatment or other patient management decisions. A negative result may occur with  improper specimen collection/handling, submission of specimen other than nasopharyngeal swab, presence of viral mutation(s) within the areas targeted by this assay, and inadequate number of viral copies(<138 copies/mL). A negative result must be combined with clinical observations, patient history, and epidemiological information. The expected result is Negative.  Fact Sheet for Patients:  EntrepreneurPulse.com.au  Fact Sheet for Healthcare Providers:  IncredibleEmployment.be  This test is no t yet approved or cleared by the [place] FDA and  has been authorized for detection and/or diagnosis of SARS-CoV-2 by FDA under an Emergency Use Authorization  (EUA). This EUA will remain  in effect (meaning this test can be used) for the duration of the COVID-19 declaration under Section 564(b)(1) of the Act, 21 U.S.C.section 360bbb-3(b)(1), unless the authorization is terminated  or revoked sooner.       Influenza A by PCR NEGATIVE NEGATIVE Final   Influenza B by PCR NEGATIVE NEGATIVE Final  Comment: (NOTE) The Xpert Xpress SARS-CoV-2/FLU/RSV plus assay is intended as an aid in the diagnosis of influenza from Nasopharyngeal swab specimens and should not be used as a sole basis for treatment. Nasal washings and aspirates are unacceptable for Xpert Xpress SARS-CoV-2/FLU/RSV testing.  Fact Sheet for Patients: EntrepreneurPulse.com.au  Fact Sheet for Healthcare Providers: IncredibleEmployment.be  This test is not yet approved or cleared by the [place] FDA and has been authorized for detection and/or diagnosis of SARS-CoV-2 by FDA under an Emergency Use Authorization (EUA). This EUA will remain in effect (meaning this test can be used) for the duration of the COVID-19 declaration under Section 564(b)(1) of the Act, 21 U.S.C. section 360bbb-3(b)(1), unless the authorization is terminated or revoked.  Performed at [facility], [place] [address] [place], [place] [address]   MRSA Next Gen by PCR, Nasal     Status: None   Collection Time: 06/11/21  7:01 PM   Specimen: Nasal Mucosa; Nasal Swab  Result Value Ref Range Status   MRSA by PCR Next Gen NOT DETECTED NOT DETECTED Final    Comment: (NOTE) The GeneXpert MRSA Assay (FDA approved for NASAL specimens only), is one component of a comprehensive MRSA colonization surveillance program. It is not intended to diagnose MRSA infection nor to guide or monitor treatment for MRSA infections. Test performance is not FDA approved in patients less than 32 years old. Performed at [facility], [place] [address], [place], [place] [address]         Radiology Studies: No results found.  Scheduled Meds:  apixaban  5 mg Oral BID   bisoprolol  5 mg Oral Daily   docusate sodium  100 mg Oral BID   furosemide  80 mg Intravenous BID   insulin aspart  0-15 Units Subcutaneous TID WC   insulin aspart  0-5 Units Subcutaneous QHS   insulin glargine-yfgn  26 Units Subcutaneous Q2200   ipratropium-albuterol  3 mL Nebulization BID   isosorbide mononitrate  30 mg Oral Daily   levothyroxine  300 mcg Oral QAC breakfast   loratadine  5 mg Oral Daily   mometasone-formoterol  2 puff Inhalation BID   nutrition supplement (JUVEN)  1 packet Oral BID BM   simvastatin  5 mg Oral Daily   sodium chloride flush  3 mL Intravenous Q12H   Continuous Infusions:   LOS: 4 days   Time spent: 27 minutes   Darliss Cheney, MD [facility]  06/16/2021, 9:58 AM  Please page via Amion and do not message via secure chat for anything urgent. Secure chat can be used for anything non urgent.  How to contact the [facility] Attending or Consulting provider [place] or covering provider during after hours [place], for this patient?  Check the care team in [facility] and look for a) attending/consulting [facility] provider listed and b) the [facility] team listed. Page or secure chat [phone]. Log into [email or web address] and use [place]'s universal password to access. If you do not have the password, please contact the hospital operator. Locate the [facility] provider you are looking for under [facility] and page to a number that you can be directly reached. If you still have difficulty reaching the provider, please page the [facility] (Director on Call) for the Hospitalists listed on amion for assistance.

## 2021-06-16 NOTE — Care Management Important Message (Signed)
Important Message  Patient Details  Name: [name] [name] MRN: [id number] Date of Birth: 08-02-1946   Medicare Important Message Given:  Yes     Orbie Pyo 06/16/2021, 4:02 PM

## 2021-06-16 NOTE — Discharge Instructions (Signed)

## 2021-06-16 NOTE — Progress Notes (Addendum)
Physical Therapy Treatment Patient Details Name: [name] [name] MRN: [id number] DOB: 1945/09/26 Today's Date: 06/16/2021   History of Present Illness Pt is a 75 yo male presenting 06/11/21 with LE edema and SOB; workup for CHF exacerbation. PMH includes: afib, CAD, CKD III, HTN, HLD, LBBB, NSTEMI (2018), DM II, sleep apnea (sleeps in recliner), morbid obesity, and neuropathy. Uses 3-4L O2 at baseline.    PT Comments    Pt making steady progress. Pt with SpO2 dropping to high 70's with amb on 4L. Pt will need manage activity level at home to account for his quick fatigue.    Recommendations for follow up therapy are one component of a multi-disciplinary discharge planning process, led by the attending physician.  Recommendations may be updated based on patient status, additional functional criteria and insurance authorization.  Follow Up Recommendations  Home health PT     Assistance Recommended at Discharge Intermittent Supervision/Assistance  Equipment Recommendations  None recommended by PT    Recommendations for Other Services       Precautions / Restrictions Precautions Precautions: Fall Precaution Comments: monitor O2 (wears 3-4 L O2 at baseline) Restrictions Weight Bearing Restrictions: No     Mobility  Bed Mobility               General bed mobility comments: Pt up in chair    Transfers Overall transfer level: Needs assistance Equipment used: Rollator (4 wheels) Transfers: Sit to/from Stand Sit to Stand: Supervision;Min assist           General transfer comment: Supervision from recliner and [facility] for safety. Min assist standing from rollator to bring hips up    Ambulation/Gait Ambulation/Gait assistance: Supervision Gait Distance (Feet): 60 Feet (x 2) Assistive device: Rollator (4 wheels) Gait Pattern/deviations: Step-through pattern;Trunk flexed;Wide base of support;Decreased stride length Gait velocity: decreased Gait velocity interpretation: 1.31  - 2.62 ft/sec, indicative of limited community ambulator   General Gait Details: Assist for safety and lines. Seated 5 minute rest break between bouts of ambulation   Stairs             Wheelchair Mobility    Modified Rankin (Stroke Patients Only)       Balance Overall balance assessment: Needs assistance Sitting-balance support: No upper extremity supported;Feet supported Sitting balance-Leahy Scale: Good     Standing balance support: Single extremity supported Standing balance-Leahy Scale: Poor Standing balance comment: UE support and supervision for static standing                            Cognition Arousal/Alertness: Awake/alert Behavior During Therapy: WFL for tasks assessed/performed Overall Cognitive Status: Within Functional Limits for tasks assessed                                          Exercises     General Comments General comments (skin integrity, edema, etc.): Pt on 4L O2 with SpO2 >90% at rest. Amb on 4L with SpO2 to 77%. Seated rest break with 4 minutes to return to greater than 90%      Pertinent Vitals/Pain Pain Assessment: No/denies pain    Home Living                          Prior Function  PT Goals (current goals can now be found in the care plan section) Acute Rehab PT Goals Patient Stated Goal: return home Progress towards PT goals: Progressing toward goals    Frequency    Min 3X/week      PT Plan Current plan remains appropriate    Co-evaluation              AM-PAC PT "6 Clicks" Mobility   Outcome Measure  Help needed turning from your back to your side while in a flat bed without using bedrails?: A Little Help needed moving from lying on your back to sitting on the side of a flat bed without using bedrails?: A Little Help needed moving to and from a bed to a chair (including a wheelchair)?: A Little Help needed standing up from a chair using your arms  (e.g., wheelchair or bedside chair)?: A Little Help needed to walk in hospital room?: A Little Help needed climbing 3-5 steps with a railing? : A Little 6 Click Score: 18    End of Session Equipment Utilized During Treatment: Oxygen Activity Tolerance: Patient limited by fatigue Patient left: in chair;with call bell/phone within reach   PT Visit Diagnosis: Other abnormalities of gait and mobility (R26.89);Muscle weakness (generalized) (M62.81);Difficulty in walking, not elsewhere classified (R26.2)     Time: 1200-1230 PT Time Calculation (min) (ACUTE ONLY): 30 min  Charges:  $Gait Training: 23-37 mins                     Sandy Pager [phone] Office [place] 06/16/2021, 2:20 PM

## 2021-06-16 NOTE — Progress Notes (Signed)
RT placed patient on CPAP HS. 4L O2 bleed in needed. Patient tolerating well at this time.  

## 2021-06-17 ENCOUNTER — Telehealth: Payer: Self-pay | Admitting: Cardiology

## 2021-06-17 DIAGNOSIS — I5033 Acute on chronic diastolic (congestive) heart failure: Secondary | ICD-10-CM | POA: Diagnosis not present

## 2021-06-17 LAB — GLUCOSE, CAPILLARY
Glucose-Capillary: 87 mg/dL (ref 70–99)
Glucose-Capillary: 79 mg/dL (ref 70–99)
Glucose-Capillary: 90 mg/dL (ref 70–99)
Glucose-Capillary: 91 mg/dL (ref 70–99)

## 2021-06-17 LAB — BASIC METABOLIC PANEL WITH GFR
Anion gap: 12 (ref 5–15)
BUN: 72 mg/dL — ABNORMAL HIGH (ref 8–23)
CO2: 24 mmol/L (ref 22–32)
Calcium: 8.6 mg/dL — ABNORMAL LOW (ref 8.9–10.3)
Chloride: 101 mmol/L (ref 98–111)
Creatinine, Ser: 2.48 mg/dL — ABNORMAL HIGH (ref 0.61–1.24)
GFR, Estimated: 27 mL/min — ABNORMAL LOW
Glucose, Bld: 83 mg/dL (ref 70–99)
Potassium: 3.2 mmol/L — ABNORMAL LOW (ref 3.5–5.1)
Sodium: 137 mmol/L (ref 135–145)

## 2021-06-17 MED ORDER — METOLAZONE 2.5 MG PO TABS
2.5000 mg | ORAL_TABLET | Freq: Once | ORAL | Status: AC
  Administered 2021-06-17: 2.5 mg via ORAL
  Filled 2021-06-17: qty 1

## 2021-06-17 MED ORDER — ZOLPIDEM TARTRATE 5 MG PO TABS
10.0000 mg | ORAL_TABLET | Freq: Every evening | ORAL | Status: AC | PRN
  Administered 2021-06-21 – 2021-06-22 (×2): 10 mg via ORAL
  Filled 2021-06-17 (×2): qty 2

## 2021-06-17 MED ORDER — POTASSIUM CHLORIDE CRYS ER 20 MEQ PO TBCR
40.0000 meq | EXTENDED_RELEASE_TABLET | ORAL | Status: AC
  Administered 2021-06-17 (×2): 40 meq via ORAL
  Filled 2021-06-17: qty 2
  Filled 2021-06-17: qty 4

## 2021-06-17 MED ORDER — FUROSEMIDE 10 MG/ML IJ SOLN
12.0000 mg/h | INTRAVENOUS | Status: DC
  Administered 2021-06-17 – 2021-06-20 (×5): 12 mg/h via INTRAVENOUS
  Filled 2021-06-17 (×6): qty 20

## 2021-06-17 MED ORDER — AMIODARONE HCL 200 MG PO TABS
400.0000 mg | ORAL_TABLET | Freq: Two times a day (BID) | ORAL | Status: DC
  Administered 2021-06-17 – 2021-06-19 (×6): 400 mg via ORAL
  Filled 2021-06-17 (×6): qty 2

## 2021-06-17 NOTE — Telephone Encounter (Signed)
Per epic notes - patient was consulted on by cardiology (Dr. Julianne Handler) at 10:44 this am.

## 2021-06-17 NOTE — Progress Notes (Signed)
Orthopedic Tech Progress Note Patient Details:  [name] [name] 14-Mar-1946 [id number]  Ortho Devices Type of Ortho Device: Haematologist Ortho Device/Splint Location: BIL Ortho Device/Splint Interventions: Ordered, Application   Post Interventions Patient Tolerated: Well  [name] 06/17/2021, 2:43 PM

## 2021-06-17 NOTE — Plan of Care (Signed)
  Problem: Education: Goal: Knowledge of General Education information will improve Description: Including pain rating scale, medication(s)/side effects and non-pharmacologic comfort measures Outcome: Progressing   Problem: Health Behavior/Discharge Planning: Goal: Ability to manage health-related needs will improve Outcome: Progressing   Problem: Clinical Measurements: Goal: Ability to maintain clinical measurements within normal limits will improve Outcome: Progressing Goal: Will remain free from infection Outcome: Progressing Goal: Diagnostic test results will improve Outcome: Progressing Goal: Respiratory complications will improve Outcome: Progressing Goal: Cardiovascular complication will be avoided Outcome: Progressing   Problem: Nutrition: Goal: Adequate nutrition will be maintained Outcome: Progressing   Problem: Coping: Goal: Level of anxiety will decrease Outcome: Progressing   Problem: Elimination: Goal: Will not experience complications related to bowel motility Outcome: Progressing Goal: Will not experience complications related to urinary retention Outcome: Progressing   Problem: Pain Managment: Goal: General experience of comfort will improve Outcome: Progressing   Problem: Safety: Goal: Ability to remain free from injury will improve Outcome: Progressing   Problem: Skin Integrity: Goal: Risk for impaired skin integrity will decrease Outcome: Progressing   Problem: Education: Goal: Ability to demonstrate management of disease process will improve Outcome: Progressing Goal: Ability to verbalize understanding of medication therapies will improve Outcome: Progressing Goal: Individualized Educational Video(s) Outcome: Progressing   Problem: Cardiac: Goal: Ability to achieve and maintain adequate cardiopulmonary perfusion will improve Outcome: Progressing

## 2021-06-17 NOTE — Progress Notes (Addendum)
PROGRESS NOTE    [name] [name]  CBU:[id number] DOB: 11-09-1945 DOA: 06/11/2021 PCP: Nickola Major, MD   Brief Narrative:  [name] [name] is a 75 y.o. male with medical history significant of afib; CAD s/p stent; stage 3 CKD; HTN; HLD; COPD; hypothyroidism; morbid obesity; NHL treated with XRT and chemotherapy); DM; and OSA presented with SOB.  He was using his home O2 of 3 to 4 L but was still feeling SOB.  Occasional PND, unchanged from prior.  He sleeps in a recliner.  His weight was up about 9 pounds upon admission.  He misses 2-3 doses a week of medications.  Upon arrival, he was in acute on chronic hypoxic respiratory failure, desatted to 60s on 4 L.  Placed on nonrebreather but quickly weaned down to 4 L.  Was admitted with acute on chronic diastolic congestive heart failure.  Has been on IV dose of Lasix with very minimal urine output and no much improvement in fluid overload/peripheral edema.  Consulted heart failure team today.  Assessment & Plan:   Principal Problem:   Acute on chronic diastolic CHF (congestive heart failure) (HCC) Active Problems:   Chronic kidney disease Stage IV   Morbid obesity (HCC)   Type 2 diabetes mellitus with stage 4 chronic kidney disease (HCC)   Hypothyroidism   Benign essential hypertension   AF (paroxysmal atrial fibrillation) (HCC)   Chronic respiratory failure with hypoxia and hypercapnia (HCC)   CHF (congestive heart failure) (HCC)  Chronic hypoxic respiratory failure/acute on chronic diastolic CHF: Echo 0/32/1224 shows 55 to 60% ejection fraction.  Uses 3 to 4 L of oxygen at home, currently on 4 L.  Despite of high dose of Lasix, urine output not impressive.  Again only 1225 cc out and he is net negative only 3 L and so many days of high-dose of Lasix.  He does have edema at his waist now as well and +3 bilateral lower extremity edema.  I will continue current dose but I have now consulted cardiology for their opinion.  Continue TED hose.     COPD with possible acute exacerbation: No more wheezes.  Completed 5 days of prednisone.   Possible OSA: Patient tells me that he had sleep study done several years ago but was never prescribed CPAP machine.  He tells me that he is under the impression that he is scheduled to have sleep study as outpatient.  I advised him to make sure that he is otherwise scheduled 1 for him.  Continue CPAP while he is here.    Permanent atrial fibrillation: Rates controlled.  Continue bisoprolol and Eliquis.   HTN: Controlled.  Continue bisoprolol and as needed hydralazine.  Hypokalemia: We will replace.  HLD -Continue Zocor   DM type II: -Last A1c was 5.8, indicating good control.  Blood sugar controlled with current dose of Tresiba and SSI.  CAD -s/p stent -Continue Imdur.  Currently asymptomatic.   Hypothyroidism: Continue Synthroid.   Morbid obesity -BMI is 41.4  -Weight loss encouraged. -Outpatient PCP/bariatric medicine f/u encouraged    NHL  -treated with XRT and chemotherapy -He is now doing annual f/u, due again in 09/2021   Stage 4 CKD: At baseline.  Chronic bilateral lower extremity wounds: Patient manages them himself at home.  Nursing to manage them while here.  Unna boot ordered.  Diarrhea: Imodium as needed.  No signs of infection.  No suspicion of C. difficile.  DVT prophylaxis: Place TED hose Start: 06/14/21 [phone]  Code Status: Full Code  Family Communication:  None present at bedside.  Plan of care discussed with patient in length and he verbalized understanding and agreed with it.  Status is: Inpatient  Remains inpatient appropriate because: Needs more IV diuretics.  Estimated body mass index is 40.28 kg/m as calculated from the following:   Height as of this encounter: 6\' 1"  (1.854 m).   Weight as of this encounter: 138.5 kg.     Nutritional Assessment: Body mass index is 40.28 kg/m.Marland Kitchen Seen by dietician.  I agree with the assessment and plan as outlined  below: Nutrition Status: Nutrition Problem: Increased nutrient needs Etiology: chronic illness (acute on chronic CHF) Signs/Symptoms: estimated needs Interventions: Liberalize Diet  .  Skin Assessment: I have examined the patient's skin and I agree with the wound assessment as performed by the wound care RN as outlined below:    Consultants:  Heart failure team  Procedures:  None  Antimicrobials:  Anti-infectives (From admission, onward)    None          Subjective:  Patient seen and examined.  No shortness of breath complains of edema at the base now.  Objective: Vitals:   06/17/21 0358 06/17/21 0716 06/17/21 0903 06/17/21 0950  BP: 120/81 125/78  118/73  Pulse: (!) 55 76 (!) 54   Resp: 16 20 19 19   Temp: 97.9 F (36.6 C) 97.8 F (36.6 C)    TempSrc: Oral Oral    SpO2: 96% 93%    Weight:      Height:        Intake/Output Summary (Last 24 hours) at 06/17/2021 0955 Last data filed at 06/17/2021 0910 Gross per 24 hour  Intake 955 ml  Output 1225 ml  Net -270 ml    Filed Weights   06/15/21 0820 06/16/21 0519 06/17/21 0147  Weight: (!) 139.2 kg (!) 138 kg (!) 138.5 kg    Examination:  General exam: Appears calm and comfortable, morbidly obese Respiratory system: Clear to auscultation. Respiratory effort normal. Cardiovascular system: S1 & S2 heard, irregularly irregular rate and rhythm. No JVD, murmurs, rubs, gallops or clicks.  +3 pitting edema bilateral lower extremity Gastrointestinal system: Abdomen is nondistended, soft and nontender. No organomegaly or masses felt. Normal bowel sounds heard.  Edema at waist bilaterally. Central nervous system: Alert and oriented. No focal neurological deficits. Extremities: Symmetric 5 x 5 power. Skin: Chronic wounds bilateral lower extremities. Psychiatry: Judgement and insight appear normal. Mood & affect appropriate.    Data Reviewed: I have personally reviewed following labs and imaging  studies  CBC: Recent Labs  Lab 06/11/21 1338 06/12/21 0200  WBC 8.4 7.1  NEUTROABS 6.3  --   HGB 14.0 14.4  HCT 44.3 46.3  MCV 89.7 89.6  PLT 141* 142*    Basic Metabolic Panel: Recent Labs  Lab 06/14/21 0054 06/14/21 0644 06/15/21 0121 06/16/21 0313 06/17/21 0055  NA 134* 139 135 136 137  K 3.9 4.4 3.6 4.6 3.2*  CL 101 103 99 102 101  CO2 23 28 26 23 24   GLUCOSE 147* 83 81 128* 83  BUN 55* 54* 60* 67* 72*  CREATININE 2.67* 2.81* 2.61* 2.48* 2.48*  CALCIUM 8.5* 9.2 8.6* 8.6* 8.6*  MG  --  2.3  --   --   --     GFR: Estimated Creatinine Clearance: 38.2 mL/min (A) (by C-G formula based on SCr of 2.48 mg/dL (H)). Liver Function Tests: Recent Labs  Lab 06/11/21 1338  AST 13*  ALT 10  ALKPHOS 100  BILITOT 1.0  PROT 6.5  ALBUMIN 3.6    No results for input(s): LIPASE, AMYLASE in the last 168 hours. No results for input(s): AMMONIA in the last 168 hours. Coagulation Profile: No results for input(s): INR, PROTIME in the last 168 hours. Cardiac Enzymes: No results for input(s): CKTOTAL, CKMB, CKMBINDEX, TROPONINI in the last 168 hours. BNP (last 3 results) No results for input(s): PROBNP in the last 8760 hours. HbA1C: No results for input(s): HGBA1C in the last 72 hours. CBG: Recent Labs  Lab 06/16/21 1120 06/16/21 1619 06/16/21 1641 06/16/21 2132 06/17/21 0623  GLUCAP 156* 65* 97 87 87    Lipid Profile: No results for input(s): CHOL, HDL, LDLCALC, TRIG, CHOLHDL, LDLDIRECT in the last 72 hours. Thyroid Function Tests: No results for input(s): TSH, T4TOTAL, FREET4, T3FREE, THYROIDAB in the last 72 hours. Anemia Panel: No results for input(s): VITAMINB12, FOLATE, FERRITIN, TIBC, IRON, RETICCTPCT in the last 72 hours. Sepsis Labs: No results for input(s): PROCALCITON, LATICACIDVEN in the last 168 hours.  Recent Results (from the past 240 hour(s))  Resp Panel by RT-PCR (Flu A&B, Covid) Nasopharyngeal Swab     Status: None   Collection Time: 06/11/21   5:29 PM   Specimen: Nasopharyngeal Swab; Nasopharyngeal(NP) swabs in vial transport medium  Result Value Ref Range Status   SARS Coronavirus 2 by RT PCR NEGATIVE NEGATIVE Final    Comment: (NOTE) SARS-CoV-2 target nucleic acids are NOT DETECTED.  The SARS-CoV-2 RNA is generally detectable in upper respiratory specimens during the acute phase of infection. The lowest concentration of SARS-CoV-2 viral copies this assay can detect is 138 copies/mL. A negative result does not preclude SARS-Cov-2 infection and should not be used as the sole basis for treatment or other patient management decisions. A negative result may occur with  improper specimen collection/handling, submission of specimen other than nasopharyngeal swab, presence of viral mutation(s) within the areas targeted by this assay, and inadequate number of viral copies(<138 copies/mL). A negative result must be combined with clinical observations, patient history, and epidemiological information. The expected result is Negative.  Fact Sheet for Patients:  EntrepreneurPulse.com.au  Fact Sheet for Healthcare Providers:  IncredibleEmployment.be  This test is no t yet approved or cleared by the [place] FDA and  has been authorized for detection and/or diagnosis of SARS-CoV-2 by FDA under an Emergency Use Authorization (EUA). This EUA will remain  in effect (meaning this test can be used) for the duration of the COVID-19 declaration under Section 564(b)(1) of the Act, 21 U.S.C.section 360bbb-3(b)(1), unless the authorization is terminated  or revoked sooner.       Influenza A by PCR NEGATIVE NEGATIVE Final   Influenza B by PCR NEGATIVE NEGATIVE Final    Comment: (NOTE) The Xpert Xpress SARS-CoV-2/FLU/RSV plus assay is intended as an aid in the diagnosis of influenza from Nasopharyngeal swab specimens and should not be used as a sole basis for treatment. Nasal washings and aspirates  are unacceptable for Xpert Xpress SARS-CoV-2/FLU/RSV testing.  Fact Sheet for Patients: EntrepreneurPulse.com.au  Fact Sheet for Healthcare Providers: IncredibleEmployment.be  This test is not yet approved or cleared by the [place] FDA and has been authorized for detection and/or diagnosis of SARS-CoV-2 by FDA under an Emergency Use Authorization (EUA). This EUA will remain in effect (meaning this test can be used) for the duration of the COVID-19 declaration under Section 564(b)(1) of the Act, 21 U.S.C. section 360bbb-3(b)(1), unless the authorization is terminated or revoked.  Performed at [facility], [place] [address], [place], [place] [address]   MRSA Next Gen by PCR, Nasal     Status: None   Collection Time: 06/11/21  7:01 PM   Specimen: Nasal Mucosa; Nasal Swab  Result Value Ref Range Status   MRSA by PCR Next Gen NOT DETECTED NOT DETECTED Final    Comment: (NOTE) The GeneXpert MRSA Assay (FDA approved for NASAL specimens only), is one component of a comprehensive MRSA colonization surveillance program. It is not intended to diagnose MRSA infection nor to guide or monitor treatment for MRSA infections. Test performance is not FDA approved in patients less than 23 years old. Performed at [facility], [place] [address] [place], [place] [address]        Radiology Studies: No results found.  Scheduled Meds:  apixaban  5 mg Oral BID   bisoprolol  5 mg Oral Daily   docusate sodium  100 mg Oral BID   furosemide  80 mg Intravenous BID   insulin aspart  0-15 Units Subcutaneous TID WC   insulin aspart  0-5 Units Subcutaneous QHS   insulin glargine-yfgn  26 Units Subcutaneous Q2200   ipratropium-albuterol  3 mL Nebulization BID   isosorbide mononitrate  30 mg Oral Daily   levothyroxine  300 mcg Oral QAC breakfast   loratadine  5 mg Oral Daily   mometasone-formoterol  2 puff Inhalation BID   nutrition supplement  (JUVEN)  1 packet Oral BID BM   potassium chloride  40 mEq Oral Q4H   simvastatin  5 mg Oral Daily   sodium chloride flush  3 mL Intravenous Q12H   Continuous Infusions:   LOS: 5 days   Time spent: 28 minutes   Darliss Cheney, MD [facility]  06/17/2021, 9:55 AM  Please page via She[facility] and do not message via secure chat for anything urgent. Secure chat can be used for anything non urgent.  How to contact the [facility] Attending or Consulting provider [place] or covering provider during after hours [place], for this patient?  Check the care team in [facility] and look for a) attending/consulting [facility] provider listed and b) the [facility] team listed. Page or secure chat [phone]. Log into [email or web address] and use [place]'s universal password to access. If you do not have the password, please contact the hospital operator. Locate the [facility] provider you are looking for under [facility] and page to a number that you can be directly reached. If you still have difficulty reaching the provider, please page the [facility] (Director on Call) for the Hospitalists listed on amion for assistance.

## 2021-06-17 NOTE — Telephone Encounter (Signed)
Pt is currently at [facility] in [facility], pt states that he have a pouch on each side of him. The admitting physician advised the pt he would be contacting [facility] but pt states he would call to speed up the process

## 2021-06-17 NOTE — Consult Note (Signed)
Advanced Heart Failure Team Consult Note   Primary Physician: Nickola Major, MD PCP-Cardiologist:  Rozann Lesches, MD  Reason for Consultation: CHF  HPI:    [name] [name] is seen today for evaluation of CHF at the request of Dr. Doristine Bosworth.   75 y.o. with history of chronic diastolic CHF, chronic hypoxemic respiratory failure/COPD, CKD stage 4, and persistent atrial fibrillation.  He has had multiple admissions this year for CHF.  He appears to have been in atrial fibrillation since around 12/21, DCCV never attempted.  He had NSTEMI in 6/18, culprit lesion was occluded OM1 that was not intervened on, he had DES to 80% mLAD.  No recent chest pain.  He has CKD stage 4, creatinine has been in the 2.5 range recently.  He is on 2 L home oxygen.  Carries history of COPD but never had PFTs.  ?OHS/OSA.  Most recent echo in 5/22 with EF 55-60%, mild RV enlargement, normal RV systolic function.   He was admitted on 11/9 with weight gain and marked edema as well as hypoxemia.  He was treated with prednisone for 5 days for ?component of COPD.  He was started on Lasix 80 mg IV bid.  He has had some diuresis but not vigorous.  Creatinine has remained fairly stalbe around 2.4-2.5.  Of note, he is supposed to be on torsemide at home, but he thinks Lasix works better so has been taking Lasix 40 mg po bid at home.   ROS: All systems reviewed and negative except as per HPI.   PMH: 1. CKD stage 4 2. OSA: Not on CPAP at home.  3. Type 2 diabetes 4. H/o non-Hodgkins lymphoma: Treated with XRT and chemotherapy.  5. Hypothyroidism 6. Atrial fibrillation: Persistent, first noted in 12/21.  7. COPD: Prior smoker, never had PFTs.  8. Chronic hypoxemic respiratory failure: On 2 L home oxygen.  9. CAD: NSTEMI 6/18 with cath showing occluded OM1 (culprit) and 80% mLAD.  He had DES to mLAD.  10. Chronic diastolic CHF: Echo in 4/27 with EF 55-60%, mild RV enlargement, normal RV systolic function.   Home  Medications Prior to Admission medications   Medication Sig Start Date End Date Taking? Authorizing Provider  acetaminophen (TYLENOL) 325 MG tablet Take 2 tablets (650 mg total) by mouth every 6 (six) hours as needed for mild pain (or Fever >/= 101). 02/01/20  Yes Emokpae, Courage, MD  apixaban (ELIQUIS) 5 MG TABS tablet Take 1 tablet (5 mg total) by mouth 2 (two) times daily. This is a dose change Patient taking differently: Take 5 mg by mouth 2 (two) times daily. 01/07/21  Yes Verta Ellen., NP  bisoprolol (ZEBETA) 5 MG tablet Take 5 mg by mouth daily. 11/26/20  Yes [provider]  fluticasone (FLONASE) 50 MCG/ACT nasal spray Place 2 sprays into both nostrils daily.   Yes [provider]  furosemide (LASIX) 40 MG tablet Take 40 mg by mouth 2 (two) times daily.   Yes [provider]  insulin degludec (TRESIBA FLEXTOUCH) 200 UNIT/ML FlexTouch Pen Inject 25 Units into the skin daily.   Yes [provider]  ipratropium-albuterol (DUONEB) 0.5-2.5 (3) MG/3ML SOLN Inhale 3 mLs into the lungs every 4 (four) hours as needed (shortness of breath). 05/22/20  Yes [provider]  isosorbide mononitrate (IMDUR) 30 MG 24 hr tablet Take 1 tablet (30 mg total) by mouth daily. 12/04/20  Yes Satira Sark, MD  levocetirizine (XYZAL) 5 MG tablet  Take 5 mg by mouth daily. 08/27/17  Yes [provider]  levothyroxine (SYNTHROID) 100 MCG tablet Take 100 mcg by mouth daily before breakfast. (Takes with 200 mcg tab for a total of 300 mcg once daily)   Yes [provider]  levothyroxine (SYNTHROID, LEVOTHROID) 200 MCG tablet Take 300 mcg by mouth daily before breakfast. (takes with 185mcg tab for a total of 317mcg)   Yes [provider]  nitroGLYCERIN (NITROSTAT) 0.4 MG SL tablet Place 0.4 mg under the tongue every 5 (five) minutes as needed for chest pain.  12/27/17  Yes [provider]  OXYGEN Inhale 3-4 L into the lungs continuous.   Yes  [provider]  senna-docusate (SENOKOT-S) 8.6-50 MG tablet Take 1 tablet by mouth daily as needed for mild constipation. 02/01/20  Yes [provider]  simvastatin (ZOCOR) 5 MG tablet Take 5 mg by mouth daily. 09/14/16  Yes [provider]  sodium chloride (OCEAN) 0.65 % SOLN nasal spray Place 1 spray into both nostrils as needed for congestion.   Yes [provider]  SYMBICORT 160-4.5 MCG/ACT inhaler Inhale 2 puffs into the lungs daily as needed (shortness of breath). 12/22/19  Yes [provider]  insulin degludec (TRESIBA) 100 UNIT/ML FlexTouch Pen Inject 10 Units into the skin daily at 10 pm. Patient not taking: No sig reported 01/24/21   Wynetta Emery, Clanford L, MD  potassium chloride SA (KLOR-CON) 10 MEQ tablet Take 1 tablet (10 mEq total) by mouth daily. Patient not taking: No sig reported 01/24/21   Irwin Brakeman L, MD  torsemide 40 MG TABS Take 40 mg by mouth 2 (two) times daily. Patient not taking: No sig reported 02/07/21 06/04/21  Manuella Ghazi, Pratik D, DO  insulin aspart (NOVOLOG) 100 UNIT/ML injection Inject 0-15 Units into the skin 3 (three) times daily with meals. Patient not taking: No sig reported 12/31/20 01/22/21  Deatra James, MD    Past Surgical History: Past Surgical History:  Procedure Laterality Date   CHOLECYSTECTOMY  1992   COLONOSCOPY N/A 09/03/2014   SLF:six colon polyps removed/small internal hemorrhoids   CORONARY STENT INTERVENTION N/A 01/21/2017   Procedure: Coronary Stent Intervention;  Surgeon: Nelva Bush, MD;  Location: [facility];  Service: Cardiovascular;  Laterality: N/A;   ESOPHAGOGASTRODUODENOSCOPY N/A 09/03/2014   SLF: mild gastritis/few gastric polyps   LEFT HEART CATH AND CORONARY ANGIOGRAPHY N/A 01/20/2017   Procedure: Left Heart Cath and Coronary Angiography;  Surgeon: Jettie Booze, MD;  Location: [facility];  Service: Cardiovascular;  Laterality: N/A;   TOTAL KNEE ARTHROPLASTY   11/10/2011   Procedure: TOTAL KNEE ARTHROPLASTY;  Surgeon: Mauri Pole, MD;  Location: [facility];  Service: Orthopedics;  Laterality: Right;   TOTAL KNEE ARTHROPLASTY Left 05/24/2018   Procedure: LEFT TOTAL KNEE ARTHROPLASTY;  Surgeon: Melrose Nakayama, MD;  Location: [facility];  Service: Orthopedics;  Laterality: Left;    Family History: Family History  Problem Relation Age of Onset   Cancer Mother        breast and lung   Cancer Father        bladder   Cancer Maternal Uncle        prostate   Cancer Paternal Uncle        esophagus   Colon cancer Neg Hx     Social History: Social History   Socioeconomic History   Marital status: Widowed    Spouse name: Not on file   Number of children: 1  Years of education: Not on file   Highest education level: Some college, no degree  Occupational History   Occupation: retired    Comment: truck Geophysicist/field seismologist  Tobacco Use   Smoking status: Former    Packs/day: 1.00    Years: 30.00    Pack years: 30.00    Types: Cigarettes    Quit date: 10/2019    Years since quitting: 1.7   Smokeless tobacco: Never  Vaping Use   Vaping Use: Never used  Substance and Sexual Activity   Alcohol use: No    Alcohol/week: 0.0 standard drinks   Drug use: No   Sexual activity: Yes  Other Topics Concern   Not on file  Social History Narrative   Not on file   Social Determinants of Health   Financial Resource Strain: Low Risk    Difficulty of Paying Living Expenses: Not very hard  Food Insecurity: No Food Insecurity   Worried About Charity fundraiser in the Last Year: Never true   Ran Out of Food in the Last Year: Never true  Transportation Needs: No Transportation Needs   Lack of Transportation (Medical): No   Lack of Transportation (Non-Medical): No  Physical Activity: Not on file  Stress: Not on file  Social Connections: Not on file    Allergies:  No Known Allergies  Objective:    Vital Signs:   Temp:  [97.1 F (36.2 C)-97.9 F (36.6 C)]  97.8 F (36.6 C) (11/15 0716) Pulse Rate:  [46-76] 54 (11/15 0903) Resp:  [15-22] 19 (11/15 0950) BP: (111-125)/(50-81) 118/73 (11/15 0950) SpO2:  [90 %-100 %] 93 % (11/15 0716) Weight:  [138.5 kg] 138.5 kg (11/15 0147) Last BM Date: 06/17/21  Weight change: Filed Weights   06/15/21 0820 06/16/21 0519 06/17/21 0147  Weight: (!) 139.2 kg (!) 138 kg (!) 138.5 kg    Intake/Output:   Intake/Output Summary (Last 24 hours) at 06/17/2021 1045 Last data filed at 06/17/2021 0910 Gross per 24 hour  Intake 955 ml  Output 1225 ml  Net -270 ml      Physical Exam    General:  Well appearing. No resp difficulty HEENT: normal Neck: supple. JVP 14-16 cm. Carotids 2+ bilat; no bruits. No lymphadenopathy or thyromegaly appreciated. Cor: PMI nondisplaced. Irregular rate & rhythm. No rubs, gallops or murmurs. Lungs: clear Abdomen: soft, nontender, nondistended. No hepatosplenomegaly. No bruits or masses. Good bowel sounds. Extremities: no cyanosis, clubbing, rash. 2+ edema to torso.  Neuro: alert & orientedx3, cranial nerves grossly intact. moves all 4 extremities w/o difficulty. Affect pleasant   Telemetry   Atrial fibrillation in 50s (personally reviewed)  EKG    Atrial fibrillation, IVCD 149 msec (personally reviewed)  Labs   Basic Metabolic Panel: Recent Labs  Lab 06/14/21 0054 06/14/21 0644 06/15/21 0121 06/16/21 0313 06/17/21 0055  NA 134* 139 135 136 137  K 3.9 4.4 3.6 4.6 3.2*  CL 101 103 99 102 101  CO2 23 28 26 23 24   GLUCOSE 147* 83 81 128* 83  BUN 55* 54* 60* 67* 72*  CREATININE 2.67* 2.81* 2.61* 2.48* 2.48*  CALCIUM 8.5* 9.2 8.6* 8.6* 8.6*  MG  --  2.3  --   --   --     Liver Function Tests: Recent Labs  Lab 06/11/21 1338  AST 13*  ALT 10  ALKPHOS 100  BILITOT 1.0  PROT 6.5  ALBUMIN 3.6   No results for input(s): LIPASE, AMYLASE in the last 168 hours. No  results for input(s): AMMONIA in the last 168 hours.  CBC: Recent Labs  Lab  06/11/21 1338 06/12/21 0200  WBC 8.4 7.1  NEUTROABS 6.3  --   HGB 14.0 14.4  HCT 44.3 46.3  MCV 89.7 89.6  PLT 141* 142*    Cardiac Enzymes: No results for input(s): CKTOTAL, CKMB, CKMBINDEX, TROPONINI in the last 168 hours.  BNP: BNP (last 3 results) Recent Labs    01/30/21 0312 06/04/21 1820 06/11/21 1338  BNP 267.0* 446.0* 199.1*    ProBNP (last 3 results) No results for input(s): PROBNP in the last 8760 hours.   CBG: Recent Labs  Lab 06/16/21 1120 06/16/21 1619 06/16/21 1641 06/16/21 2132 06/17/21 0623  GLUCAP 156* 65* 97 87 87    Coagulation Studies: No results for input(s): LABPROT, INR in the last 72 hours.   Imaging   No results found.   Medications:     Current Medications:  amiodarone  400 mg Oral BID   apixaban  5 mg Oral BID   docusate sodium  100 mg Oral BID   insulin aspart  0-15 Units Subcutaneous TID WC   insulin aspart  0-5 Units Subcutaneous QHS   insulin glargine-yfgn  26 Units Subcutaneous Q2200   ipratropium-albuterol  3 mL Nebulization BID   isosorbide mononitrate  30 mg Oral Daily   levothyroxine  300 mcg Oral QAC breakfast   loratadine  5 mg Oral Daily   metolazone  2.5 mg Oral Once   mometasone-formoterol  2 puff Inhalation BID   nutrition supplement (JUVEN)  1 packet Oral BID BM   potassium chloride  40 mEq Oral Q4H   simvastatin  5 mg Oral Daily   sodium chloride flush  3 mL Intravenous Q12H    Infusions:  furosemide (LASIX) 200 mg in dextrose 5% 100 mL (2mg /mL) infusion       Assessment/Plan   1. Acute on chronic diastolic CHF: In setting of CKD stage IV and persistent atrial fibrillation.  Last echo in 5/22 with EF 55-60%, mild RV enlargement, normal RV systolic function. On exam, he is markedly volume overloaded with edema into his torso.  He has not diuresed will with Lasix 80 mg IV bid, creatinine stable today at 2.48 (this is around his baseline).   - Place Unna boots. - Stop Lasix boluses, start Lasix gtt  12 mg/hr and will give dose of metolazone 2.5 x 1 today.  - Replace K.  - Would try to get him back into NSR (see below).  - May be able to eventually start Jardiance, follow GFR.  - Will plan RHC this admission, will follow course to determine timing.  2. Atrial fibrillation: Persistent since 12/21, DCCV never attempted.  Suspect he will not stay in NSR without anti-arrhythmic.  I would like to see if regaining NSR would help with CHF management.  - Stop bisoprolol - Start amiodarone 400 mg bid.  - Eventual TEE-guided DCCV (he missed at least 1 dose of apixaban last week).  - Continue apixaban 5 mg bid.  3. CAD: S/p NSTEMI with occluded OM1 in 6/18, had DES to 80% stenosis mLAD at that time.  No further chest pain.  - No coronary angiography in absence of ACS given CKD stage 4.  - Continue statin.  - No ASA given Eliquis use.  4. CKD: Stage IV.  Creatinine stable around 2.5.  5. Type 2 DM: Continue insulin.  6. Chronic hypoxemic respiratory failure: He is on 2L home oxygen.  ?  OHS/OSA, ?COPD.  He smoked in the past but not heavily.  - Would get PFTs after diuresis.  - Needs home sleep study.   Length of Stay: [place], MD  06/17/2021, 10:45 AM  Advanced Heart Failure Team Pager [phone] (M-F; 7a - 5p)  Please contact [facility] for night-coverage after hours (4p -7a ) and weekends on [email or web address]

## 2021-06-18 DIAGNOSIS — I5033 Acute on chronic diastolic (congestive) heart failure: Secondary | ICD-10-CM | POA: Diagnosis not present

## 2021-06-18 LAB — BASIC METABOLIC PANEL
Anion gap: 10 (ref 5–15)
BUN: 78 mg/dL — ABNORMAL HIGH (ref 8–23)
CO2: 32 mmol/L (ref 22–32)
Calcium: 8.9 mg/dL (ref 8.9–10.3)
Chloride: 97 mmol/L — ABNORMAL LOW (ref 98–111)
Creatinine, Ser: 2.7 mg/dL — ABNORMAL HIGH (ref 0.61–1.24)
GFR, Estimated: 24 mL/min — ABNORMAL LOW (ref >60)
Glucose, Bld: 107 mg/dL — ABNORMAL HIGH (ref 70–99)
Potassium: 3.8 mmol/L (ref 3.5–5.1)
Sodium: 139 mmol/L (ref 135–145)

## 2021-06-18 LAB — GLUCOSE, CAPILLARY
Glucose-Capillary: 72 mg/dL (ref 70–99)
Glucose-Capillary: 70 mg/dL (ref 70–99)
Glucose-Capillary: 85 mg/dL (ref 70–99)
Glucose-Capillary: 130 mg/dL — ABNORMAL HIGH (ref 70–99)

## 2021-06-18 MED ORDER — METOLAZONE 2.5 MG PO TABS
2.5000 mg | ORAL_TABLET | Freq: Once | ORAL | Status: AC
  Administered 2021-06-18: 2.5 mg via ORAL
  Filled 2021-06-18: qty 1

## 2021-06-18 MED ORDER — POTASSIUM CHLORIDE CRYS ER 20 MEQ PO TBCR
40.0000 meq | EXTENDED_RELEASE_TABLET | Freq: Once | ORAL | Status: AC
  Administered 2021-06-18: 40 meq via ORAL
  Filled 2021-06-18: qty 2

## 2021-06-18 NOTE — TOC Initial Note (Addendum)
Transition of Care [facility]) - Initial/Assessment Note    Patient Details  Name: [name] [name] MRN: [id number] Date of Birth: [birth year]  [facility] CM/SW Contact:    Erenest Rasher, RN Phone Number: [phone]  06/18/2021, 12:57 PM  Clinical Narrative:                  Irven Shelling TOC CM spoke to pt and gave permission to speak to dtr. He is active with [facility] for [facility]. Contacted [facility] rep, Stacie. Will need resumption of care orders for [facility] RN and PT with [facility]. He has oxygen, wheelchair, rolling walker at home. Contacted [facility] to make aware. Attempted call to daughter. Pt reports living with daughter.    Expected Discharge Plan: [place] Barriers to Discharge: Continued Medical Work up   Patient Goals and CMS Choice Patient states their goals for this hospitalization and ongoing recovery are:: remain independent at home CMS Medicare.gov Compare Post Acute Care list provided to:: Patient Choice offered to / list presented to : Patient  Expected Discharge Plan and Services Expected Discharge Plan: [place] In-house Referral: Clinical Social Work Discharge Planning Services: CM Consult Post Acute Care Choice: [place] arrangements for the past 2 months: Single Family Home                           HH Arranged: RN, PT HH Agency: [facility] Date [facility] Agency Contacted: 06/18/21 Time HH Agency Contacted: 90 Representative spoke with at [facility]: Freddi Starr  Prior Living Arrangements/Services Living arrangements for the past 2 months: Single Family Home Lives with:: Spouse Patient language and need for interpreter reviewed:: Yes Do you feel safe going back to the place where you live?: Yes      Need for Family Participation in Patient Care: Yes (Comment) Care giver support system in place?: Yes (comment) Current home services: DME, Home PT, Home RN (oxygen, rolling walker, bedside  commode, electric wheelchair) Criminal Activity/Legal Involvement Pertinent to Current Situation/Hospitalization: No - Comment as needed  Activities of Daily Living Home Assistive Devices/Equipment: Blood pressure cuff, CBG Meter, Walker (specify type) ADL Screening (condition at time of admission) Patient's cognitive ability adequate to safely complete daily activities?: Yes Is the patient deaf or have difficulty hearing?: No Does the patient have difficulty seeing, even when wearing glasses/contacts?: No Does the patient have difficulty concentrating, remembering, or making decisions?: No Patient able to express need for assistance with ADLs?: Yes Does the patient have difficulty dressing or bathing?: No Independently performs ADLs?: Yes (appropriate for developmental age) Does the patient have difficulty walking or climbing stairs?: Yes Weakness of Legs: Both Weakness of Arms/Hands: Both  Permission Sought/Granted Permission sought to share information with : Case Manager, PCP, Family Supports Permission granted to share information with : Yes, Verbal Permission Granted  Share Information with NAME: Lattie Haw  Permission granted to share info w AGENCY: [facility], DME  Permission granted to share info w Relationship: daughter  Permission granted to share info w Contact Information: [phone]  Emotional Assessment   Attitude/Demeanor/Rapport: Gracious Affect (typically observed): Calm Orientation: : Oriented to Self, Oriented to Place, Oriented to  Time, Oriented to Situation Alcohol / Substance Use: Not Applicable Psych Involvement: No (comment)  Admission diagnosis:  Acute on chronic diastolic HF (heart failure) (HCC) [I50.33] Acute on chronic diastolic CHF (congestive heart failure) ([place]) [I50.33] Acute on chronic  congestive heart failure, unspecified heart failure type ([place]) [I50.9] CHF (congestive heart failure) ([place]) [I50.9] Patient Active Problem List   Diagnosis  Date Noted   CKD (chronic kidney disease) stage 4, GFR 15-29 ml/min ([place]) 01/21/2021   Pleural effusion 01/20/2021   Thrombocytopenia ([place]) 01/20/2021   Elevated d-dimer 01/20/2021   Atrial fibrillation, chronic ([place]) 01/20/2021   CHF exacerbation ([place]) 12/28/2020   CHF (congestive heart failure) ([place]) 12/28/2020   Acute on chronic diastolic HF (heart failure) ([place]) 10/06/2020   Chronic respiratory failure with hypoxia and hypercapnia ([place]) 09/10/2020   Former smoker 08/10/2020   Acute on chronic diastolic CHF (congestive heart failure) ([place]) 08/05/2020   Acute respiratory failure with hypoxia ([place]) 08/04/2020   Acute exacerbation of CHF (congestive heart failure) ([place]) 07/19/2020   AF (paroxysmal atrial fibrillation) ([place]) 07/19/2020   COVID-19    Acute on chronic respiratory failure with hypoxia and hypercapnia ([place]) 06/25/2020   Aspiration pneumonia ([place]) 06/24/2020   Pneumonia due to COVID-19 virus 06/24/2020   Confusion    Small bowel obstruction ([place]) 01/29/2020   SBO (small bowel obstruction) ([place]) 01/28/2020   Primary osteoarthritis of left knee 05/24/2018   Primary localized osteoarthritis of left knee 05/19/2018   Acute on chronic systolic and diastolic heart failure, NYHA class 1 ([place]) 04/01/2017   CAD (coronary artery disease) 04/01/2017   DOE (dyspnea on exertion) 03/21/2017   HTN (hypertension) 03/21/2017   NSTEMI (non-ST elevated myocardial infarction) ([place]) 01/19/2017   NSTEMI, initial episode of care ([place]) 01/19/2017   Sepsis ([place]) 07/24/2016   Elevated troponin 07/24/2016   Fever 07/24/2016   Lactic acidosis 07/24/2016   Adjustment insomnia 05/18/2016   Erectile dysfunction 12/19/2015   Sleep apnea 09/30/2015   Osteoarthritis 09/30/2015   Hypothyroidism 09/30/2015   Hypogonadism in male 09/30/2015   Diabetic neuropathy ([place]) 09/30/2015   Chronic pain of left knee 09/30/2015   Benign essential hypertension 09/30/2015   Acute on chronic renal failure ([place])  05/14/2015   Diarrhea 05/14/2015   Dehydration 05/14/2015   Hypokalemia 05/14/2015   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   Pelvic mass in male    Varices, esophageal ([place])    Colonic mass    Abnormal CT scan, pelvis    Bladder mass    Elevated liver enzymes    Elevated LFTs    Abdominal pain 07/23/2014   Chronic kidney disease Stage IV 07/23/2014   Morbid obesity ([place]) 07/23/2014   Type 2 diabetes mellitus with stage 4 chronic kidney disease ([place]) 07/23/2014   Hypertension 07/23/2014   S/P total knee replacement, 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   PCP:  Nickola Major, MD Pharmacy:   [facility] [place] [address] [place] [address] Phone: [phone] Fax: [phone]     Social Determinants of Health (SDOH) Interventions Food Insecurity Interventions: Intervention Not Indicated Financial Strain Interventions: Intervention Not Indicated Housing Interventions: Intervention Not Indicated Transportation Interventions: Intervention Not Indicated (previously enrolled in [facility] transportation services)  Readmission Risk Interventions Readmission Risk Prevention Plan 01/31/2021 12/29/2020 10/07/2020  Transportation Screening Complete Complete Complete  Medication Review Press photographer) - Complete Complete  PCP or Specialist appointment within 3-5 days of discharge - Complete -  [place] or Home Care Consult Complete Complete Complete  SW Recovery Care/Counseling Consult Complete Complete Complete  Palliative Care Screening Complete Not Applicable Not Applicable  Skilled Nursing Facility Complete Complete Not Applicable  Some recent data might be hidden

## 2021-06-18 NOTE — Progress Notes (Signed)
Occupational Therapy Treatment Patient Details Name: [name] [name] MRN: [id number] DOB: 10/16/1945 Today's Date: 06/18/2021   History of present illness Pt is a 75 yo male presenting 06/11/21 with LE edema and SOB; workup for CHF exacerbation. PMH includes: afib, CAD, CKD III, HTN, HLD, LBBB, NSTEMI (2018), DM II, sleep apnea (sleeps in recliner), morbid obesity, and neuropathy. Uses 3-4L O2 at baseline.   OT comments  Pt requiring only one rest break during UE exercises with level 2 theraband. Stood for pressure relief x 1 with supervision and RW. Completed seated grooming with set up.    Recommendations for follow up therapy are one component of a multi-disciplinary discharge planning process, led by the attending physician.  Recommendations may be updated based on patient status, additional functional criteria and insurance authorization.    Follow Up Recommendations  No OT follow up    Assistance Recommended at Discharge Set up Supervision/Assistance  Equipment Recommendations  Tub/shower seat (current seat is worn out)    Recommendations for Other Services      Precautions / Restrictions Precautions Precautions: Fall Precaution Comments: monitor O2 (wears 3-4 L O2 at baseline)       Mobility Bed Mobility               General bed mobility comments: pt received up in recliner    Transfers   Equipment used: Rollator (4 wheels) Transfers: Sit to/from Stand Sit to Stand: Supervision           General transfer comment: stood x 1 for pressure relief     Balance Overall balance assessment: Needs assistance   Sitting balance-Leahy Scale: Good     Standing balance support: Bilateral upper extremity supported Standing balance-Leahy Scale: Poor Standing balance comment: benefits from UE support of DME                           ADL either performed or assessed with clinical judgement   ADL Overall ADL's : Needs assistance/impaired     Grooming:  Set up;Sitting;Wash/dry hands;Wash/dry face;Oral care                                      Extremity/Trunk Assessment              Vision       Perception     Praxis      Cognition Arousal/Alertness: Awake/alert Behavior During Therapy: WFL for tasks assessed/performed Overall Cognitive Status: Within Functional Limits for tasks assessed                                            Exercises Exercises: General Upper Extremity General Exercises - Upper Extremity Shoulder Flexion: AROM;Both;20 reps;Seated;Theraband Theraband Level (Shoulder Flexion): Level 2 (Red) Shoulder Extension: AROM;Both;20 reps;Seated;Theraband Theraband Level (Shoulder Extension): Level 2 (Red) Shoulder Horizontal ABduction: Strengthening;Both;20 reps;Seated;Theraband Theraband Level (Shoulder Horizontal Abduction): Level 2 (Red) Elbow Flexion: AROM;Both;20 reps;Seated;Theraband Theraband Level (Elbow Flexion): Level 2 (Red) Elbow Extension: AROM;Both;20 reps;Seated;Theraband Theraband Level (Elbow Extension): Level 2 (Red) Other Exercises Other Exercises: resisted punches x 20 each   Shoulder Instructions       General Comments      Pertinent Vitals/ Pain       Pain Assessment: No/denies pain  Home  Living                                          Prior Functioning/Environment              Frequency  Min 2X/week        Progress Toward Goals  OT Goals(current goals can now be found in the care plan section)  Progress towards OT goals: Progressing toward goals  Acute Rehab OT Goals OT Goal Formulation: With patient Time For Goal Achievement: 06/26/21 Potential to Achieve Goals: Good  Plan Discharge plan remains appropriate    Co-evaluation                 AM-PAC OT "6 Clicks" Daily Activity     Outcome Measure   Help from another person eating meals?: None Help from another person taking care of personal  grooming?: A Little Help from another person toileting, which includes using toliet, bedpan, or urinal?: A Little Help from another person bathing (including washing, rinsing, drying)?: A Little Help from another person to put on and taking off regular upper body clothing?: A Little Help from another person to put on and taking off regular lower body clothing?: A Little 6 Click Score: 19    End of Session Equipment Utilized During Treatment: Oxygen;Rolling walker (2 wheels)  OT Visit Diagnosis: Other (comment);History of falling (Z91.81);Other abnormalities of gait and mobility (R26.89) (decreased activity tolerance)   Activity Tolerance Patient tolerated treatment well   Patient Left in chair;with call bell/phone within reach   Nurse Communication          Time: 1530-1555 OT Time Calculation (min): 25 min  Charges: OT General Charges $OT Visit: 1 Visit OT Treatments $Self Care/Home Management : 8-22 mins $Therapeutic Exercise: 8-22 mins  Nestor Lewandowsky, OTR/L Acute Rehabilitation Services Pager: [phone] Office: [phone]   Malka So 06/18/2021, 3:58 PM

## 2021-06-18 NOTE — Progress Notes (Signed)
Patient ID: [name] [name], male   DOB: 08/26/1945, 75 y.o.   MRN: [id number]     Advanced Heart Failure Rounding Note  PCP-Cardiologist: Rozann Lesches, MD   Subjective:    Still feels swollen.   Good diuresis yesterday with Lasix gtt 12 mg/hr + metolazone 2.5.  Weight down ?8 lbs.    Objective:   Weight Range: 135.5 kg Body mass index is 39.41 kg/m.   Vital Signs:   Temp:  [97.2 F (36.2 C)-98 F (36.7 C)] 97.2 F (36.2 C) (11/16 0733) Pulse Rate:  [50-62] 56 (11/16 0733) Resp:  [12-20] 19 (11/16 0733) BP: (99-121)/(61-83) 106/61 (11/16 0733) SpO2:  [91 %-100 %] 96 % (11/16 0733) Weight:  [135.5 kg] 135.5 kg (11/16 0511) Last BM Date: 06/17/21  Weight change: Filed Weights   06/16/21 0519 06/17/21 0147 06/18/21 0511  Weight: (!) 138 kg (!) 138.5 kg 135.5 kg    Intake/Output:   Intake/Output Summary (Last 24 hours) at 06/18/2021 0825 Last data filed at 06/18/2021 0533 Gross per 24 hour  Intake 1865.47 ml  Output 4575 ml  Net -2709.53 ml      Physical Exam    General:  Well appearing. No resp difficulty HEENT: Normal Neck: Supple. JVP 14 cm. Carotids 2+ bilat; no bruits. No lymphadenopathy or thyromegaly appreciated. Cor: PMI nondisplaced. Bradycardic, irregular rate & rhythm. No rubs, gallops or murmurs. Lungs: Clear Abdomen: Soft, nontender, nondistended. No hepatosplenomegaly. No bruits or masses. Good bowel sounds. Extremities: No cyanosis, clubbing, rash. 2+ edema to torso Neuro: Alert & orientedx3, cranial nerves grossly intact. moves all 4 extremities w/o difficulty. Affect pleasant   Telemetry   Atrial fibrillation rate 40s (personally reviewed)  Labs    CBC No results for input(s): WBC, NEUTROABS, HGB, HCT, MCV, PLT in the last 72 hours. Basic Metabolic Panel Recent Labs    06/16/21 0313 06/17/21 0055  NA 136 137  K 4.6 3.2*  CL 102 101  CO2 23 24  GLUCOSE 128* 83  BUN 67* 72*  CREATININE 2.48* 2.48*  CALCIUM 8.6* 8.6*   Liver  Function Tests No results for input(s): AST, ALT, ALKPHOS, BILITOT, PROT, ALBUMIN in the last 72 hours. No results for input(s): LIPASE, AMYLASE in the last 72 hours. Cardiac Enzymes No results for input(s): CKTOTAL, CKMB, CKMBINDEX, TROPONINI in the last 72 hours.  BNP: BNP (last 3 results) Recent Labs    01/30/21 0312 06/04/21 1820 06/11/21 1338  BNP 267.0* 446.0* 199.1*    ProBNP (last 3 results) No results for input(s): PROBNP in the last 8760 hours.   D-Dimer No results for input(s): DDIMER in the last 72 hours. Hemoglobin A1C No results for input(s): HGBA1C in the last 72 hours. Fasting Lipid Panel No results for input(s): CHOL, HDL, LDLCALC, TRIG, CHOLHDL, LDLDIRECT in the last 72 hours. Thyroid Function Tests No results for input(s): TSH, T4TOTAL, T3FREE, THYROIDAB in the last 72 hours.  Invalid input(s): FREET3  Other results:   Imaging    No results found.   Medications:     Scheduled Medications:  amiodarone  400 mg Oral BID   apixaban  5 mg Oral BID   docusate sodium  100 mg Oral BID   insulin aspart  0-15 Units Subcutaneous TID WC   insulin aspart  0-5 Units Subcutaneous QHS   insulin glargine-yfgn  26 Units Subcutaneous Q2200   ipratropium-albuterol  3 mL Nebulization BID   isosorbide mononitrate  30 mg Oral Daily   levothyroxine  300 mcg  Oral QAC breakfast   loratadine  5 mg Oral Daily   metolazone  2.5 mg Oral Once   mometasone-formoterol  2 puff Inhalation BID   nutrition supplement (JUVEN)  1 packet Oral BID BM   potassium chloride  40 mEq Oral Once   simvastatin  5 mg Oral Daily   sodium chloride flush  3 mL Intravenous Q12H    Infusions:  furosemide (LASIX) 200 mg in dextrose 5% 100 mL (2mg /mL) infusion 12 mg/hr (06/18/21 0103)    PRN Medications: acetaminophen **OR** acetaminophen, albuterol, alum & mag hydroxide-simeth, bisacodyl, hydrALAZINE, loperamide, morphine injection, ondansetron **OR** ondansetron (ZOFRAN) IV, oxyCODONE,  polyethylene glycol, sodium chloride, zolpidem    Assessment/Plan   1. Acute on chronic diastolic CHF: In setting of CKD stage IV and persistent atrial fibrillation.  Last echo in 5/22 with EF 55-60%, mild RV enlargement, normal RV systolic function. Admitted with marked volume overload. Diuresing better today on Lasix gtt + metolazone.  Weight down.  Still volume overloaded.  No labs yet today.  - Place Unna boots. - Continue Lasix gtt 12 mg/hr and will give dose of metolazone 2.5 x 1 today.  - Awaiting BMET (send stat), replace K as needed.  - Would try to get him back into NSR (see below).  - May be able to eventually start Jardiance, follow GFR.  - Will plan RHC this admission, will follow course to determine timing.  2. Atrial fibrillation: Persistent since 12/21, DCCV never attempted.  Suspect he will not stay in NSR without anti-arrhythmic.  I would like to see if regaining NSR would help with CHF management.  - Bisoprolol stopped with bradycardia.  - Continue amiodarone 400 mg bid.  - Eventual TEE-guided DCCV (he missed at least 1 dose of apixaban last week).  - Continue apixaban 5 mg bid.  3. CAD: S/p NSTEMI with occluded OM1 in 6/18, had DES to 80% stenosis mLAD at that time.  No further chest pain.  - No coronary angiography in absence of ACS given CKD stage 4.  - Continue statin.  - No ASA given Eliquis use.  4. CKD: Stage IV.  Creatinine stable around 2.5. - Needs BMET today.   5. Type 2 DM: Continue insulin.  6. Chronic hypoxemic respiratory failure: He is on 2L home oxygen.  ?OHS/OSA, ?COPD.  He smoked in the past but not heavily.  - Would get PFTs after diuresis.  - Needs home sleep study.   Length of Stay: 6  [name], MD  06/18/2021, 8:25 AM  Advanced Heart Failure Team Pager [phone] (M-F; 7a - 5p)  Please contact [facility] for night-coverage after hours (5p -7a ) and weekends on [email or web address]

## 2021-06-18 NOTE — Progress Notes (Signed)
PROGRESS NOTE    [name] [name]  ZRA:[id number] DOB: July 13, 1946 DOA: 06/11/2021 PCP: Nickola Major, MD   Brief Narrative:  [name] [name] is a 75 y.o. male with medical history significant of afib; CAD s/p stent; stage 3 CKD; HTN; HLD; COPD; hypothyroidism; morbid obesity; NHL treated with XRT and chemotherapy); DM; and OSA presented with SOB.  He was using his home O2 of 3 to 4 L but was still feeling SOB.  Occasional PND, unchanged from prior.  He sleeps in a recliner.  His weight was up about 9 pounds upon admission.  He misses 2-3 doses a week of medications.  Upon arrival, he was in acute on chronic hypoxic respiratory failure, desatted to 60s on 4 L.  Placed on nonrebreather but quickly weaned down to 4 L.  Was admitted with acute on chronic diastolic congestive heart failure.  Has been on IV dose of Lasix with very minimal urine output and no much improvement in fluid overload/peripheral edema.  Consulted heart failure team today.  Assessment & Plan:   Principal Problem:   Acute on chronic diastolic CHF (congestive heart failure) (HCC) Active Problems:   Chronic kidney disease Stage IV   Morbid obesity (HCC)   Type 2 diabetes mellitus with stage 4 chronic kidney disease (HCC)   Hypothyroidism   Benign essential hypertension   AF (paroxysmal atrial fibrillation) (HCC)   Chronic respiratory failure with hypoxia and hypercapnia (HCC)   CHF (congestive heart failure) (HCC)  Chronic hypoxic respiratory failure/acute on chronic diastolic CHF: Echo 5/45/6256 shows 55 to 60% ejection fraction.  Uses 3 to 4 L of oxygen at home, currently on 4 L.  Despite of high dose of IV Lasix bolus doses, he did not have significant urine output and did not have improvement in volume overload.  Cardiology consulted, he has been started on Lasix infusion plus metolazone.  He has had robust urine output since then, almost 4 L with 8 pounds down.  Still feels swollen.  I guess that he is going to need at  least 3-5 more days of IV infusion and perhaps 20-30 more pounds of weight loss before he could be discharged.   COPD with possible acute exacerbation: No more wheezes.  Completed 5 days of prednisone.   Possible OSA: Patient tells me that he had sleep study done several years ago but was never prescribed CPAP machine.  He tells me that he is under the impression that he is scheduled to have sleep study as outpatient.  I advised him to make sure that he is otherwise scheduled 1 for him.  Continue CPAP while he is here.    Permanent atrial fibrillation: Rates controlled.  Continue bisoprolol and Eliquis.   HTN: Controlled.  Continue bisoprolol and as needed hydralazine.  Hypokalemia: We will replace.  HLD -Continue Zocor   DM type II: -Last A1c was 5.8, indicating good control.  Blood sugar controlled with current dose of Tresiba and SSI.  CAD -s/p stent -Continue Imdur.  Currently asymptomatic.   Hypothyroidism: Continue Synthroid.   Morbid obesity -BMI is 41.4  -Weight loss encouraged. -Outpatient PCP/bariatric medicine f/u encouraged    NHL  -treated with XRT and chemotherapy -He is now doing annual f/u, due again in 09/2021   Stage 4 CKD: Slight jump in creatinine but is still falls within CKD stage IV level.  Monitor closely while he is on infusion.  Chronic bilateral lower extremity wounds: Patient manages them himself at home.  Nursing  to manage them while here.  Unna boot ordered.  Diarrhea: Resolved.  Imodium as needed.  DVT prophylaxis: Place TED hose Start: 06/14/21 0958   Code Status: Full Code  Family Communication:  None present at bedside.  Plan of care discussed with patient in length and he verbalized understanding and agreed with it.  Status is: Inpatient  Remains inpatient appropriate because: Needs more IV diuretics.  Estimated body mass index is 39.41 kg/m as calculated from the following:   Height as of this encounter: 6\' 1"  (1.854 m).   Weight as  of this encounter: 135.5 kg.     Nutritional Assessment: Body mass index is 39.41 kg/m.Marland Kitchen Seen by dietician.  I agree with the assessment and plan as outlined below: Nutrition Status: Nutrition Problem: Increased nutrient needs Etiology: chronic illness (acute on chronic CHF) Signs/Symptoms: estimated needs Interventions: Liberalize Diet  .  Skin Assessment: I have examined the patient's skin and I agree with the wound assessment as performed by the wound care RN as outlined below:    Consultants:  Heart failure team  Procedures:  None  Antimicrobials:  Anti-infectives (From admission, onward)    None          Subjective:  Patient seen and examined.  No complaints.  Still feels swollen.  Objective: Vitals:   06/18/21 0341 06/18/21 0511 06/18/21 0733 06/18/21 0836  BP: 110/64  106/61   Pulse: (!) 50  (!) 56   Resp: 12  19   Temp: 97.7 F (36.5 C)  (!) 97.2 F (36.2 C)   TempSrc: Oral  Oral   SpO2: 100%  96% 94%  Weight:  135.5 kg    Height:        Intake/Output Summary (Last 24 hours) at 06/18/2021 1127 Last data filed at 06/18/2021 1100 Gross per 24 hour  Intake 1643.47 ml  Output 5225 ml  Net -3581.53 ml    Filed Weights   06/16/21 0519 06/17/21 0147 06/18/21 0511  Weight: (!) 138 kg (!) 138.5 kg 135.5 kg    Examination:  General exam: Appears calm and comfortable  Respiratory system: Clear to auscultation. Respiratory effort normal. Cardiovascular system: S1 & S2 heard, RRR. No JVD, murmurs, rubs, gallops or clicks.  +3 pitting edema bilateral lower extremity, up to torso. Gastrointestinal system: Abdomen is nondistended, soft and nontender. No organomegaly or masses felt. Normal bowel sounds heard. Central nervous system: Alert and oriented. No focal neurological deficits. Extremities: Symmetric 5 x 5 power. Skin: No rashes, lesions or ulcers.  Psychiatry: Judgement and insight appear normal. Mood & affect appropriate.    Data  Reviewed: I have personally reviewed following labs and imaging studies  CBC: Recent Labs  Lab 06/11/21 1338 06/12/21 0200  WBC 8.4 7.1  NEUTROABS 6.3  --   HGB 14.0 14.4  HCT 44.3 46.3  MCV 89.7 89.6  PLT 141* 142*    Basic Metabolic Panel: Recent Labs  Lab 06/14/21 0644 06/15/21 0121 06/16/21 0313 06/17/21 0055 06/18/21 0846  NA 139 135 136 137 139  K 4.4 3.6 4.6 3.2* 3.8  CL 103 99 102 101 97*  CO2 28 26 23 24  32  GLUCOSE 83 81 128* 83 107*  BUN 54* 60* 67* 72* 78*  CREATININE 2.81* 2.61* 2.48* 2.48* 2.70*  CALCIUM 9.2 8.6* 8.6* 8.6* 8.9  MG 2.3  --   --   --   --     GFR: Estimated Creatinine Clearance: 34.7 mL/min (A) (by C-G formula based on  SCr of 2.7 mg/dL (H)). Liver Function Tests: Recent Labs  Lab 06/11/21 1338  AST 13*  ALT 10  ALKPHOS 100  BILITOT 1.0  PROT 6.5  ALBUMIN 3.6    No results for input(s): LIPASE, AMYLASE in the last 168 hours. No results for input(s): AMMONIA in the last 168 hours. Coagulation Profile: No results for input(s): INR, PROTIME in the last 168 hours. Cardiac Enzymes: No results for input(s): CKTOTAL, CKMB, CKMBINDEX, TROPONINI in the last 168 hours. BNP (last 3 results) No results for input(s): PROBNP in the last 8760 hours. HbA1C: No results for input(s): HGBA1C in the last 72 hours. CBG: Recent Labs  Lab 06/17/21 0623 06/17/21 1123 06/17/21 1633 06/17/21 2116 06/18/21 0613  GLUCAP 87 79 90 91 72    Lipid Profile: No results for input(s): CHOL, HDL, LDLCALC, TRIG, CHOLHDL, LDLDIRECT in the last 72 hours. Thyroid Function Tests: No results for input(s): TSH, T4TOTAL, FREET4, T3FREE, THYROIDAB in the last 72 hours. Anemia Panel: No results for input(s): VITAMINB12, FOLATE, FERRITIN, TIBC, IRON, RETICCTPCT in the last 72 hours. Sepsis Labs: No results for input(s): PROCALCITON, LATICACIDVEN in the last 168 hours.  Recent Results (from the past 240 hour(s))  Resp Panel by RT-PCR (Flu A&B, Covid)  Nasopharyngeal Swab     Status: None   Collection Time: 06/11/21  5:29 PM   Specimen: Nasopharyngeal Swab; Nasopharyngeal(NP) swabs in vial transport medium  Result Value Ref Range Status   SARS Coronavirus 2 by RT PCR NEGATIVE NEGATIVE Final    Comment: (NOTE) SARS-CoV-2 target nucleic acids are NOT DETECTED.  The SARS-CoV-2 RNA is generally detectable in upper respiratory specimens during the acute phase of infection. The lowest concentration of SARS-CoV-2 viral copies this assay can detect is 138 copies/mL. A negative result does not preclude SARS-Cov-2 infection and should not be used as the sole basis for treatment or other patient management decisions. A negative result may occur with  improper specimen collection/handling, submission of specimen other than nasopharyngeal swab, presence of viral mutation(s) within the areas targeted by this assay, and inadequate number of viral copies(<138 copies/mL). A negative result must be combined with clinical observations, patient history, and epidemiological information. The expected result is Negative.  Fact Sheet for Patients:  EntrepreneurPulse.com.au  Fact Sheet for Healthcare Providers:  IncredibleEmployment.be  This test is no t yet approved or cleared by the [place] FDA and  has been authorized for detection and/or diagnosis of SARS-CoV-2 by FDA under an Emergency Use Authorization (EUA). This EUA will remain  in effect (meaning this test can be used) for the duration of the COVID-19 declaration under Section 564(b)(1) of the Act, 21 U.S.C.section 360bbb-3(b)(1), unless the authorization is terminated  or revoked sooner.       Influenza A by PCR NEGATIVE NEGATIVE Final   Influenza B by PCR NEGATIVE NEGATIVE Final    Comment: (NOTE) The Xpert Xpress SARS-CoV-2/FLU/RSV plus assay is intended as an aid in the diagnosis of influenza from Nasopharyngeal swab specimens and should not be  used as a sole basis for treatment. Nasal washings and aspirates are unacceptable for Xpert Xpress SARS-CoV-2/FLU/RSV testing.  Fact Sheet for Patients: EntrepreneurPulse.com.au  Fact Sheet for Healthcare Providers: IncredibleEmployment.be  This test is not yet approved or cleared by the [place] FDA and has been authorized for detection and/or diagnosis of SARS-CoV-2 by FDA under an Emergency Use Authorization (EUA). This EUA will remain in effect (meaning this test can be used) for the duration of the COVID-19  declaration under Section 564(b)(1) of the Act, 21 U.S.C. section 360bbb-3(b)(1), unless the authorization is terminated or revoked.  Performed at [facility], [place] [address], [place], [place] [address]   MRSA Next Gen by PCR, Nasal     Status: None   Collection Time: 06/11/21  7:01 PM   Specimen: Nasal Mucosa; Nasal Swab  Result Value Ref Range Status   MRSA by PCR Next Gen NOT DETECTED NOT DETECTED Final    Comment: (NOTE) The GeneXpert MRSA Assay (FDA approved for NASAL specimens only), is one component of a comprehensive MRSA colonization surveillance program. It is not intended to diagnose MRSA infection nor to guide or monitor treatment for MRSA infections. Test performance is not FDA approved in patients less than 14 years [name]. Performed at [facility], [place] [address] [place], [place] [address]        Radiology Studies: No results found.  Scheduled Meds:  amiodarone  400 mg Oral BID   apixaban  5 mg Oral BID   docusate sodium  100 mg Oral BID   insulin aspart  0-15 Units Subcutaneous TID WC   insulin aspart  0-5 Units Subcutaneous QHS   insulin glargine-yfgn  26 Units Subcutaneous Q2200   isosorbide mononitrate  30 mg Oral Daily   levothyroxine  300 mcg Oral QAC breakfast   loratadine  5 mg Oral Daily   mometasone-formoterol  2 puff Inhalation BID   nutrition supplement (JUVEN)  1 packet Oral  BID BM   potassium chloride  40 mEq Oral Once   simvastatin  5 mg Oral Daily   sodium chloride flush  3 mL Intravenous Q12H   Continuous Infusions:  furosemide (LASIX) 200 mg in dextrose 5% 100 mL (2mg /mL) infusion 12 mg/hr (06/18/21 1100)     LOS: 6 days   Time spent: 27 minutes   Darliss Cheney, MD [facility]  06/18/2021, 11:27 AM  Please page via Shea Evans and do not message via secure chat for anything urgent. Secure chat can be used for anything non urgent.  How to contact the [facility] Attending or Consulting provider [place] or covering provider during after hours [place], for this patient?  Check the care team in [facility] and look for a) attending/consulting [facility] provider listed and b) the [facility] team listed. Page or secure chat [phone]. Log into [email or web address] and use [place]'s universal password to access. If you do not have the password, please contact the hospital operator. Locate the [facility] provider you are looking for under [facility] and page to a number that you can be directly reached. If you still have difficulty reaching the provider, please page the [facility] (Director on Call) for the Hospitalists listed on amion for assistance.

## 2021-06-18 NOTE — Progress Notes (Addendum)
Nutrition Follow-up  DOCUMENTATION CODES:   Morbid obesity  INTERVENTION:  - Continue 1 packet Juven BID, each packet provides 95 calories, 2.5 grams of protein (collagen), and 9.8 grams of carbohydrate (3 grams sugar); also contains 7 grams of L-arginine and L-glutamine, 300 mg vitamin C, 15 mg vitamin E, 1.2 mcg vitamin B-12, 9.5 mg zinc, 200 mg calcium, and 1.5 g  Calcium Beta-hydroxy-Beta-methylbutyrate to support wound healing  - Night time snacks   NUTRITION DIAGNOSIS:   Increased nutrient needs related to chronic illness (acute on chronic CHF) as evidenced by estimated needs.  On-going  GOAL:   Patient will meet greater than or equal to 90% of their needs  Meeting needs through PO intake  MONITOR:   PO intake, Supplement acceptance, Labs  REASON FOR ASSESSMENT:   Consult Other (Comment) (nutrition goals)  ASSESSMENT:   Pt from home. Admitted with SOB secondary to acute on chronic CHF. PMH includes afib, CAD, CKD IV, HTN, hyperlipidemia, COPD, hypothyroidism, obesity, non-hodgkin's lymphoma, T2DM, and OSA.  Pt continues with lasix and diuretic treatments to help with volume overload.  Pt reports eating well and continues with good appetite. He states that at night he has been hungry d/t 12 hour gap between dinner and breakfast and has been getting graham crackers but would like night time snacks.   He states that he was experiencing diarrhea that has improved x3 days.  Weight trending downward. Suspect weight loss can be attributed to negative fluid balance.  Medications: colace, SSI, semglee, metolazone, Kcl, IV lasix CBGs: 72-91 x24 hours  Labs :  BUN 78, Cr 2.70  UOP: 4573mL x24 hours I/O's: -5861mL since admission  Diet Order:   Diet Order             Diet Heart Room service appropriate? Yes; Fluid consistency: Thin; Fluid restriction: 1200 mL Fluid  Diet effective now                   EDUCATION NEEDS:   Education needs have been  addressed  Skin:  Skin Assessment: Skin Integrity Issues: Skin Integrity Issues:: Other (Comment) Other: venous stasis ulcer R leg  Last BM:  06/17/21  Height:   Ht Readings from Last 1 Encounters:  06/15/21 6\' 1"  (1.854 m)    Weight:   Wt Readings from Last 1 Encounters:  06/18/21 135.5 kg    Ideal Body Weight:  83.6 kg  BMI:  Body mass index is 39.41 kg/m.  Estimated Nutritional Needs:   Kcal:  1800-2000  Protein:  110-120g  Fluid:  >1.8L  Clayborne Dana, RDN, LDN Clinical Nutrition

## 2021-06-18 NOTE — Progress Notes (Signed)
Physical Therapy Treatment Patient Details Name: [name] [name] MRN: [id number] DOB: 1946/01/02 Today's Date: 06/18/2021   History of Present Illness Pt is a 75 yo male presenting 06/11/21 with LE edema and SOB; workup for CHF exacerbation. PMH includes: afib, CAD, CKD III, HTN, HLD, LBBB, NSTEMI (2018), DM II, sleep apnea (sleeps in recliner), morbid obesity, and neuropathy. Uses 3-4L O2 at baseline.    PT Comments    Pt tolerates treatment well with increased ambulation distances this session. Pt demonstrates impaired brake use of rollator and will benefit from continued reinforcement of proper DME management to improve safety. PT will continue to follow in an effort to improve activity tolerance and independence.   Recommendations for follow up therapy are one component of a multi-disciplinary discharge planning process, led by the attending physician.  Recommendations may be updated based on patient status, additional functional criteria and insurance authorization.  Follow Up Recommendations  Home health PT     Assistance Recommended at Discharge Intermittent Supervision/Assistance  Equipment Recommendations  None recommended by PT    Recommendations for Other Services       Precautions / Restrictions Precautions Precautions: Fall Precaution Comments: monitor O2 (wears 3-4 L O2 at baseline) Restrictions Weight Bearing Restrictions: No     Mobility  Bed Mobility               General bed mobility comments: pt received up in recliner    Transfers Overall transfer level: Needs assistance Equipment used: Rollator (4 wheels) Transfers: Sit to/from Stand Sit to Stand: Supervision                Ambulation/Gait Ambulation/Gait assistance: Supervision Gait Distance (Feet): 60 Feet (60' x 2, seated rest break between trials) Assistive device: Rollator (4 wheels) Gait Pattern/deviations: Step-through pattern;Wide base of support Gait velocity: reduced Gait  velocity interpretation: <1.8 ft/sec, indicate of risk for recurrent falls   General Gait Details: pt with slowed step-through gait, widened BOS   Stairs             Wheelchair Mobility    Modified Rankin (Stroke Patients Only)       Balance Overall balance assessment: Needs assistance Sitting-balance support: No upper extremity supported;Feet supported Sitting balance-Leahy Scale: Good     Standing balance support: Single extremity supported;Bilateral upper extremity supported Standing balance-Leahy Scale: Poor Standing balance comment: benefits from UE support of DME                            Cognition Arousal/Alertness: Awake/alert Behavior During Therapy: WFL for tasks assessed/performed Overall Cognitive Status: Within Functional Limits for tasks assessed                                          Exercises      General Comments General comments (skin integrity, edema, etc.): pt on 4L [place], ambulating first bout with stable sats in 90s. SpO2 reads with desat to 80s at end of second bout, recovering within 1-2 minutes of seated rest break      Pertinent Vitals/Pain Pain Assessment: Faces Faces Pain Scale: Hurts a little bit Pain Location: knee Pain Descriptors / Indicators: Sore Pain Intervention(s): Monitored during session    Home Living  Prior Function            PT Goals (current goals can now be found in the care plan section) Acute Rehab PT Goals Patient Stated Goal: return home Progress towards PT goals: Progressing toward goals    Frequency    Min 3X/week      PT Plan Current plan remains appropriate    Co-evaluation              AM-PAC PT "6 Clicks" Mobility   Outcome Measure  Help needed turning from your back to your side while in a flat bed without using bedrails?: A Little Help needed moving from lying on your back to sitting on the side of a flat bed  without using bedrails?: A Little Help needed moving to and from a bed to a chair (including a wheelchair)?: A Little Help needed standing up from a chair using your arms (e.g., wheelchair or bedside chair)?: A Little Help needed to walk in hospital room?: A Little Help needed climbing 3-5 steps with a railing? : A Little 6 Click Score: 18    End of Session Equipment Utilized During Treatment: Oxygen Activity Tolerance: Patient limited by fatigue Patient left: in chair;with call bell/phone within reach Nurse Communication: Mobility status PT Visit Diagnosis: Other abnormalities of gait and mobility (R26.89);Muscle weakness (generalized) (M62.81);Difficulty in walking, not elsewhere classified (R26.2)     Time: 1324-4010 PT Time Calculation (min) (ACUTE ONLY): 28 min  Charges:  $Gait Training: 23-37 mins                     Zenaida Niece, PT, DPT Acute Rehabilitation Pager: [phone] Office [place] 06/18/2021, 11:05 AM

## 2021-06-18 NOTE — Progress Notes (Signed)
Heart Failure Navigator Progress Note  Assessed for Heart & Vascular TOC clinic readiness.  Patient does not meet criteria due to AHF rounding team consulted this hspitalization.   Navigator available for reassessment of patient.   Pricilla Holm, MSN, RN Heart Failure Nurse Navigator [phone]

## 2021-06-18 NOTE — Progress Notes (Signed)
Mobility Specialist Progress Note    06/18/21 1723  Mobility  Activity Ambulated in hall  Level of Assistance Modified independent, requires aide device or extra time  Assistive Device Four wheel walker  Distance Ambulated (ft) 110 ft (55+55)  Mobility Ambulated independently in hallway  Mobility Response Tolerated fair  Mobility performed by Mobility specialist  $Mobility charge 1 Mobility   Pt received in chair and agreeable. C/o swelling in legs. Pt seemed very tired and was yawning/putting his head down frequently. Took x1 seated rest break for about 5 minutes. Ambulated on 6LO2. Returned to [facility] with NT present.   Hildred Alamin Mobility Specialist  Mobility Specialist Phone: [phone]

## 2021-06-19 ENCOUNTER — Encounter (HOSPITAL_COMMUNITY)
Admission: EM | Disposition: A | Discharge status: Home Health | Payer: Self-pay | Source: Home / Self Care | Attending: Family Medicine

## 2021-06-19 ENCOUNTER — Inpatient Hospital Stay (HOSPITAL_COMMUNITY): Payer: Medicare HMO

## 2021-06-19 ENCOUNTER — Encounter (HOSPITAL_COMMUNITY): Payer: Self-pay | Admitting: Cardiology

## 2021-06-19 DIAGNOSIS — N184 Chronic kidney disease, stage 4 (severe): Secondary | ICD-10-CM | POA: Diagnosis not present

## 2021-06-19 DIAGNOSIS — I5031 Acute diastolic (congestive) heart failure: Secondary | ICD-10-CM | POA: Diagnosis not present

## 2021-06-19 DIAGNOSIS — I5033 Acute on chronic diastolic (congestive) heart failure: Secondary | ICD-10-CM | POA: Diagnosis not present

## 2021-06-19 HISTORY — PX: RIGHT HEART CATH: CATH118263

## 2021-06-19 LAB — BASIC METABOLIC PANEL
Anion gap: 10 (ref 5–15)
BUN: 89 mg/dL — ABNORMAL HIGH (ref 8–23)
CO2: 31 mmol/L (ref 22–32)
Calcium: 9 mg/dL (ref 8.9–10.3)
Chloride: 95 mmol/L — ABNORMAL LOW (ref 98–111)
Creatinine, Ser: 3.07 mg/dL — ABNORMAL HIGH (ref 0.61–1.24)
GFR, Estimated: 21 mL/min — ABNORMAL LOW (ref >60)
Glucose, Bld: 90 mg/dL (ref 70–99)
Potassium: 3.3 mmol/L — ABNORMAL LOW (ref 3.5–5.1)
Sodium: 136 mmol/L (ref 135–145)

## 2021-06-19 LAB — POCT I-STAT EG7
Acid-Base Excess: 6 mmol/L — ABNORMAL HIGH (ref 0.0–2.0)
Acid-Base Excess: 6 mmol/L — ABNORMAL HIGH (ref 0.0–2.0)
Bicarbonate: 33 mmol/L — ABNORMAL HIGH (ref 20.0–28.0)
Bicarbonate: 33.4 mmol/L — ABNORMAL HIGH (ref 20.0–28.0)
Calcium, Ion: 1.17 mmol/L (ref 1.15–1.40)
Calcium, Ion: 1.19 mmol/L (ref 1.15–1.40)
HCT: 42 % (ref 39.0–52.0)
HCT: 42 % (ref 39.0–52.0)
Hemoglobin: 14.3 g/dL (ref 13.0–17.0)
Hemoglobin: 14.3 g/dL (ref 13.0–17.0)
O2 Saturation: 62 %
O2 Saturation: 63 %
Potassium: 3.4 mmol/L — ABNORMAL LOW (ref 3.5–5.1)
Potassium: 3.5 mmol/L (ref 3.5–5.1)
Sodium: 139 mmol/L (ref 135–145)
Sodium: 139 mmol/L (ref 135–145)
TCO2: 35 mmol/L — ABNORMAL HIGH (ref 22–32)
TCO2: 35 mmol/L — ABNORMAL HIGH (ref 22–32)
pCO2, Ven: 56.3 mmHg (ref 44.0–60.0)
pCO2, Ven: 57 mmHg (ref 44.0–60.0)
pH, Ven: 7.376 (ref 7.250–7.430)
pH, Ven: 7.376 (ref 7.250–7.430)
pO2, Ven: 34 mmHg (ref 32.0–45.0)
pO2, Ven: 34 mmHg (ref 32.0–45.0)

## 2021-06-19 LAB — ECHOCARDIOGRAM COMPLETE
Area-P 1/2: 3.61 cm2
Calc EF: 47.8 %
Height: 73 in
S' Lateral: 4.9 cm
Single Plane A2C EF: 41.3 %
Single Plane A4C EF: 53.6 %
Weight: 4677.28 oz

## 2021-06-19 LAB — GLUCOSE, CAPILLARY
Glucose-Capillary: 91 mg/dL (ref 70–99)
Glucose-Capillary: 114 mg/dL — ABNORMAL HIGH (ref 70–99)
Glucose-Capillary: 96 mg/dL (ref 70–99)
Glucose-Capillary: 149 mg/dL — ABNORMAL HIGH (ref 70–99)
Glucose-Capillary: 126 mg/dL — ABNORMAL HIGH (ref 70–99)

## 2021-06-19 SURGERY — RIGHT HEART CATH
Anesthesia: LOCAL

## 2021-06-19 MED ORDER — LIDOCAINE HCL (PF) 1 % IJ SOLN
INTRAMUSCULAR | Status: AC
  Filled 2021-06-19: qty 30

## 2021-06-19 MED ORDER — POTASSIUM CHLORIDE CRYS ER 20 MEQ PO TBCR
40.0000 meq | EXTENDED_RELEASE_TABLET | Freq: Two times a day (BID) | ORAL | Status: DC
  Administered 2021-06-19 (×2): 40 meq via ORAL
  Filled 2021-06-19 (×2): qty 2

## 2021-06-19 MED ORDER — LIDOCAINE HCL (PF) 1 % IJ SOLN
INTRAMUSCULAR | Status: DC | PRN
  Administered 2021-06-19: 2 mL

## 2021-06-19 MED ORDER — HEPARIN (PORCINE) IN NACL 1000-0.9 UT/500ML-% IV SOLN
INTRAVENOUS | Status: DC | PRN
  Administered 2021-06-19: 500 mL

## 2021-06-19 MED ORDER — SODIUM CHLORIDE 0.9% FLUSH: 3.0000 mL | INTRAVENOUS | Status: DC | PRN

## 2021-06-19 MED ORDER — SODIUM CHLORIDE 0.9 % IV SOLN: INTRAVENOUS | Status: DC

## 2021-06-19 MED ORDER — PERFLUTREN LIPID MICROSPHERE
1.0000 mL | INTRAVENOUS | Status: AC | PRN
Start: 2021-06-19 — End: 2021-06-19
  Administered 2021-06-19: 14:00:00 2 mL via INTRAVENOUS
  Filled 2021-06-19: qty 10

## 2021-06-19 MED ORDER — APIXABAN 5 MG PO TABS
5.0000 mg | ORAL_TABLET | Freq: Two times a day (BID) | ORAL | Status: DC
  Administered 2021-06-19 – 2021-06-26 (×14): 5 mg via ORAL
  Filled 2021-06-19 (×14): qty 1

## 2021-06-19 MED ORDER — ASPIRIN 81 MG PO CHEW
81.0000 mg | CHEWABLE_TABLET | ORAL | Status: AC
  Administered 2021-06-19: 11:00:00 81 mg via ORAL
  Filled 2021-06-19: qty 1

## 2021-06-19 MED ORDER — SODIUM CHLORIDE 0.9 % IV SOLN: 250.0000 mL | INTRAVENOUS | Status: DC | PRN

## 2021-06-19 MED ORDER — HEPARIN (PORCINE) IN NACL 1000-0.9 UT/500ML-% IV SOLN
INTRAVENOUS | Status: AC
  Filled 2021-06-19: qty 500

## 2021-06-19 MED ORDER — SODIUM CHLORIDE 0.9% FLUSH
3.0000 mL | Freq: Two times a day (BID) | INTRAVENOUS | Status: DC
  Administered 2021-06-20 – 2021-06-25 (×11): 3 mL via INTRAVENOUS

## 2021-06-19 SURGICAL SUPPLY — 7 items
CATH BALLN WEDGE 5F 110CM (CATHETERS) ×2 IMPLANT
MAT PREVALON FULL STRYKER (MISCELLANEOUS) ×2 IMPLANT
PACK CARDIAC CATHETERIZATION (CUSTOM PROCEDURE TRAY) ×2 IMPLANT
SHEATH GLIDE SLENDER 4/5FR (SHEATH) ×2 IMPLANT
TRANSDUCER W/STOPCOCK (MISCELLANEOUS) ×2 IMPLANT
TUBING ART PRESS 72  MALE/FEM (TUBING) ×1
TUBING ART PRESS 72 MALE/FEM (TUBING) ×1 IMPLANT

## 2021-06-19 NOTE — Interval H&P Note (Signed)
History and Physical Interval Note:  06/19/2021 12:24 PM  [name] [name]  has presented today for surgery, with the diagnosis of CHF.  The various methods of treatment have been discussed with the patient and family. After consideration of risks, benefits and other options for treatment, the patient has consented to  Procedure(s): RIGHT HEART CATH (N/A) as a surgical intervention.  The patient's history has been reviewed, patient examined, no change in status, stable for surgery.  I have reviewed the patient's chart and labs.  Questions were answered to the patient's satisfaction.     Nicolai Labonte [employer]

## 2021-06-19 NOTE — Progress Notes (Signed)
Mobility Specialist Progress Note    06/19/21 1619  Mobility  Activity Ambulated in hall  Level of Assistance Modified independent, requires aide device or extra time  Assistive Device Four wheel walker  Distance Ambulated (ft) 110 ft (55+55)  Mobility Ambulated independently in hallway  Mobility Response Tolerated fair  Mobility performed by Mobility specialist  Bed Position Chair  $Mobility charge 1 Mobility   Pt received in chair and agreeable. C/o leg pain. Took x1 seated rest break to recover for ~5 minutes. Returned to chair with call bell in reach.   [facility] Mobility Specialist  M.S. Primary Phone: [phone] M.S. Secondary Phone: [phone]

## 2021-06-19 NOTE — Progress Notes (Signed)
Patient ID: [name] [name], male   DOB: 12/23/1945, 75 y.o.   MRN: [id number]     Advanced Heart Failure Rounding Note  PCP-Cardiologist: Rozann Lesches, MD   Subjective:    Good diuresis yesterday with Lasix gtt 12 mg/hr + metolazone 2.5.  Weight down 6 lbs again.  However, creatinine up to 3.07.   Feels swollen, not short of breath at rest.    Objective:   Weight Range: 132.6 kg Body mass index is 38.57 kg/m.   Vital Signs:   Temp:  [97.3 F (36.3 C)-98.8 F (37.1 C)] 97.8 F (36.6 C) (11/17 0851) Pulse Rate:  [51-91] 57 (11/17 0723) Resp:  [13-20] 13 (11/17 0723) BP: (102-115)/(56-68) 102/66 (11/17 0723) SpO2:  [93 %-98 %] 94 % (11/17 0838) Weight:  [132.6 kg] 132.6 kg (11/17 0421) Last BM Date: 06/19/21  Weight change: Filed Weights   06/17/21 0147 06/18/21 0511 06/19/21 0421  Weight: (!) 138.5 kg 135.5 kg 132.6 kg    Intake/Output:   Intake/Output Summary (Last 24 hours) at 06/19/2021 0909 Last data filed at 06/19/2021 0852 Gross per 24 hour  Intake 1838 ml  Output 4650 ml  Net -2812 ml      Physical Exam    General: NAD Neck: Thick, JVP difficult, no thyromegaly or thyroid nodule.  Lungs: Clear to auscultation bilaterally with normal respiratory effort. CV: Nondisplaced PMI.  Heart irregular S1/S2, no S3/S4, no murmur.  2+ dependent edema lower extremities.  Abdomen: Soft, nontender, no hepatosplenomegaly, no distention.  Skin: Intact without lesions or rashes.  Neurologic: Alert and oriented x 3.  Psych: Normal affect. Extremities: No clubbing or cyanosis.  HEENT: Normal.    Telemetry   Atrial fibrillation rate 40s-50s (personally reviewed)  Labs    CBC No results for input(s): WBC, NEUTROABS, HGB, HCT, MCV, PLT in the last 72 hours. Basic Metabolic Panel Recent Labs    06/18/21 0846 06/19/21 0326  NA 139 136  K 3.8 3.3*  CL 97* 95*  CO2 32 31  GLUCOSE 107* 90  BUN 78* 89*  CREATININE 2.70* 3.07*  CALCIUM 8.9 9.0   Liver  Function Tests No results for input(s): AST, ALT, ALKPHOS, BILITOT, PROT, ALBUMIN in the last 72 hours. No results for input(s): LIPASE, AMYLASE in the last 72 hours. Cardiac Enzymes No results for input(s): CKTOTAL, CKMB, CKMBINDEX, TROPONINI in the last 72 hours.  BNP: BNP (last 3 results) Recent Labs    01/30/21 0312 06/04/21 1820 06/11/21 1338  BNP 267.0* 446.0* 199.1*    ProBNP (last 3 results) No results for input(s): PROBNP in the last 8760 hours.   D-Dimer No results for input(s): DDIMER in the last 72 hours. Hemoglobin A1C No results for input(s): HGBA1C in the last 72 hours. Fasting Lipid Panel No results for input(s): CHOL, HDL, LDLCALC, TRIG, CHOLHDL, LDLDIRECT in the last 72 hours. Thyroid Function Tests No results for input(s): TSH, T4TOTAL, T3FREE, THYROIDAB in the last 72 hours.  Invalid input(s): FREET3  Other results:   Imaging    No results found.   Medications:     Scheduled Medications:  amiodarone  400 mg Oral BID   apixaban  5 mg Oral BID   docusate sodium  100 mg Oral BID   insulin aspart  0-15 Units Subcutaneous TID WC   insulin aspart  0-5 Units Subcutaneous QHS   insulin glargine-yfgn  26 Units Subcutaneous Q2200   isosorbide mononitrate  30 mg Oral Daily   levothyroxine  300 mcg Oral  QAC breakfast   loratadine  5 mg Oral Daily   mometasone-formoterol  2 puff Inhalation BID   nutrition supplement (JUVEN)  1 packet Oral BID BM   potassium chloride  40 mEq Oral BID   simvastatin  5 mg Oral Daily   sodium chloride flush  3 mL Intravenous Q12H   sodium chloride flush  3 mL Intravenous Q12H    Infusions:  furosemide (LASIX) 200 mg in dextrose 5% 100 mL (2mg /mL) infusion 12 mg/hr (06/19/21 0300)    PRN Medications: acetaminophen **OR** acetaminophen, albuterol, alum & mag hydroxide-simeth, bisacodyl, hydrALAZINE, loperamide, morphine injection, ondansetron **OR** ondansetron (ZOFRAN) IV, oxyCODONE, polyethylene glycol, sodium  chloride, zolpidem    Assessment/Plan   1. Acute on chronic diastolic CHF: In setting of CKD stage IV and persistent atrial fibrillation.  Last echo in 5/22 with EF 55-60%, mild RV enlargement, normal RV systolic function. Admitted with marked volume overload. Diuresing better on Lasix gtt + metolazone.  Weight down again.  He has a lot of peripheral edema but JVP difficult to assess.  Creatinine higher at 3.07.  - Continue Unna boots. - With rise in creatinine and difficulty with JVP assessment, will get RHC today to decide on ongoing diuresis.  For now, will continue Lasix gtt 12 mg/hr.  Discussed risks/benefits of procedure with patient and he agrees.  - Would try to get him back into NSR (see below).  - May be able to eventually start Jardiance, follow GFR.  2. Atrial fibrillation: Persistent since 12/21, DCCV never attempted.  Suspect he will not stay in NSR without anti-arrhythmic.  I would like to see if regaining NSR would help with CHF management.  - Bisoprolol stopped with bradycardia.  - Continue amiodarone 400 mg bid.  - Eventual TEE-guided DCCV (he missed at least 1 dose of apixaban last week).  - Continue apixaban 5 mg bid.  3. CAD: S/p NSTEMI with occluded OM1 in 6/18, had DES to 80% stenosis mLAD at that time.  No further chest pain.  - No coronary angiography in absence of ACS given CKD stage 4.  - Continue statin.  - No ASA given Eliquis use.  4. CKD: Stage IV.  Creatinine higher at 3.07 today.  - RHC as above to determine cardiac filling pressures.  5. Type 2 DM: Continue insulin.  6. Chronic hypoxemic respiratory failure: He is on 2L home oxygen.  ?OHS/OSA, ?COPD.  He smoked in the past but not heavily.  - Would get PFTs after diuresis.  - Needs home sleep study.   Length of Stay: 7  [name], MD  06/19/2021, 9:09 AM  Advanced Heart Failure Team Pager [phone] (M-F; 7a - 5p)  Please contact [facility] for night-coverage after hours (5p -7a ) and  weekends on [email or web address]

## 2021-06-19 NOTE — Progress Notes (Signed)
PROGRESS NOTE    [name] [name]  ONG:[id number] DOB: 10/14/1945 DOA: 06/11/2021 PCP: Nickola Major, MD   Brief Narrative:  [name] [name] is a 75 y.o. male with medical history significant of afib; CAD s/p stent; stage 3 CKD; HTN; HLD; COPD; hypothyroidism; morbid obesity; NHL treated with XRT and chemotherapy); DM; and OSA presented with SOB.  He was using his home O2 of 3 to 4 L but was still feeling SOB.  Occasional PND, unchanged from prior.  He sleeps in a recliner.  His weight was up about 9 pounds upon admission.  He misses 2-3 doses a week of medications.  Upon arrival, he was in acute on chronic hypoxic respiratory failure, desatted to 60s on 4 L.  Placed on nonrebreather but quickly weaned down to 4 L.  Was admitted with acute on chronic diastolic congestive heart failure.  Has been on IV dose of Lasix with very minimal urine output and no much improvement in fluid overload/peripheral edema.  Consulted heart failure team today.  Assessment & Plan:   Principal Problem:   Acute on chronic diastolic CHF (congestive heart failure) (HCC) Active Problems:   Chronic kidney disease Stage IV   Morbid obesity (HCC)   Type 2 diabetes mellitus with stage 4 chronic kidney disease (HCC)   Hypothyroidism   Benign essential hypertension   AF (paroxysmal atrial fibrillation) (HCC)   Chronic respiratory failure with hypoxia and hypercapnia (HCC)   CHF (congestive heart failure) (HCC)  Chronic hypoxic respiratory failure/acute on chronic diastolic CHF: Echo 4/40/1027 shows 55 to 60% ejection fraction.  Uses 3 to 4 L of oxygen at home, currently on 4 L.  Despite of high dose of IV Lasix bolus doses, he did not have significant urine output and did not have improvement in volume overload.  Cardiology consulted, he has been started on Lasix infusion plus metolazone.  He has had another 24 hours of robust urine output with 5 L last 24 hours with a net negative of 9.5 L so far.  Weight down of about 7 to  8 pounds as well.  Continue current plan.  Appreciate cardiology help.   COPD with possible acute exacerbation: No more wheezes.  Completed 5 days of prednisone.   Possible OSA: Patient tells me that he had sleep study done several years ago but was never prescribed CPAP machine.  He tells me that he is under the impression that he is scheduled to have sleep study as outpatient.  I advised him to make sure that he is otherwise scheduled 1 for him.  Continue CPAP while he is here.    Permanent atrial fibrillation: Rates controlled.  Bisoprolol was stopped and he was started on amiodarone on 06/17/2021.  Continue Eliquis.   HTN: Controlled.  Continue bisoprolol and as needed hydralazine.  Hypokalemia: We will replace.  HLD -Continue Zocor   DM type II: -Last A1c was 5.8, indicating good control.  Blood sugar controlled with current dose of Tresiba and SSI.  CAD -s/p stent -Continue Imdur.  Currently asymptomatic.   Hypothyroidism: Continue Synthroid.   Morbid obesity -BMI is 41.4  -Weight loss encouraged. -Outpatient PCP/bariatric medicine f/u encouraged    NHL  -treated with XRT and chemotherapy -He is now doing annual f/u, due again in 09/2021   Stage 4 CKD: Slight jump in creatinine yet once again today but is still falls within CKD stage IV level.  Monitor closely while he is on infusion.  Hypokalemia: We will replace.  Chronic bilateral lower extremity wounds: Patient manages them himself at home.  Nursing to manage them while here.  Unna boot ordered.  Diarrhea: Resolved.  Imodium as needed.  DVT prophylaxis: Place TED hose Start: 06/14/21 0958   Code Status: Full Code  Family Communication:  None present at bedside.  Plan of care discussed with patient in length and he verbalized understanding and agreed with it.  Status is: Inpatient  Remains inpatient appropriate because: Needs more IV diuretics.  Estimated body mass index is 38.57 kg/m as calculated from the  following:   Height as of this encounter: 6\' 1"  (1.854 m).   Weight as of this encounter: 132.6 kg.     Nutritional Assessment: Body mass index is 38.57 kg/m.Marland Kitchen Seen by dietician.  I agree with the assessment and plan as outlined below: Nutrition Status: Nutrition Problem: Increased nutrient needs Etiology: chronic illness (acute on chronic CHF) Signs/Symptoms: estimated needs Interventions: Liberalize Diet  .  Skin Assessment: I have examined the patient's skin and I agree with the wound assessment as performed by the wound care RN as outlined below:    Consultants:  Heart failure team  Procedures:  None  Antimicrobials:  Anti-infectives (From admission, onward)    None          Subjective: Patient seen and examined.  He has no complaints.  He is happy that his swelling is improving.  Objective: Vitals:   06/18/21 2327 06/19/21 0300 06/19/21 0421 06/19/21 0723  BP: 102/68 112/62  102/66  Pulse: 60 (!) 52  (!) 57  Resp: 18 14  13   Temp: 98.8 F (37.1 C) 98.5 F (36.9 C)  97.8 F (36.6 C)  TempSrc: Oral Oral  Oral  SpO2: 95% 95%  93%  Weight:   132.6 kg   Height:        Intake/Output Summary (Last 24 hours) at 06/19/2021 0829 Last data filed at 06/19/2021 0805 Gross per 24 hour  Intake 1844 ml  Output 4950 ml  Net -3106 ml    Filed Weights   06/17/21 0147 06/18/21 0511 06/19/21 0421  Weight: (!) 138.5 kg 135.5 kg 132.6 kg    Examination:  General exam: Appears calm and comfortable  Respiratory system: Clear to auscultation. Respiratory effort normal. Cardiovascular system: S1 & S2 heard, RRR. No JVD, murmurs, rubs, gallops or clicks.  +3 pitting edema goes up to torso. Gastrointestinal system: Abdomen is nondistended, soft and nontender. No organomegaly or masses felt. Normal bowel sounds heard. Central nervous system: Alert and oriented. No focal neurological deficits. Extremities: Symmetric 5 x 5 power. Skin: No rashes, lesions or ulcers.   Psychiatry: Judgement and insight appear normal. Mood & affect appropriate.    Data Reviewed: I have personally reviewed following labs and imaging studies  CBC: No results for input(s): WBC, NEUTROABS, HGB, HCT, MCV, PLT in the last 168 hours.  Basic Metabolic Panel: Recent Labs  Lab 06/14/21 0644 06/15/21 0121 06/16/21 0313 06/17/21 0055 06/18/21 0846 06/19/21 0326  NA 139 135 136 137 139 136  K 4.4 3.6 4.6 3.2* 3.8 3.3*  CL 103 99 102 101 97* 95*  CO2 28 26 23 24  32 31  GLUCOSE 83 81 128* 83 107* 90  BUN 54* 60* 67* 72* 78* 89*  CREATININE 2.81* 2.61* 2.48* 2.48* 2.70* 3.07*  CALCIUM 9.2 8.6* 8.6* 8.6* 8.9 9.0  MG 2.3  --   --   --   --   --     GFR: Estimated  Creatinine Clearance: 30.2 mL/min (A) (by C-G formula based on SCr of 3.07 mg/dL (H)). Liver Function Tests: No results for input(s): AST, ALT, ALKPHOS, BILITOT, PROT, ALBUMIN in the last 168 hours.  No results for input(s): LIPASE, AMYLASE in the last 168 hours. No results for input(s): AMMONIA in the last 168 hours. Coagulation Profile: No results for input(s): INR, PROTIME in the last 168 hours. Cardiac Enzymes: No results for input(s): CKTOTAL, CKMB, CKMBINDEX, TROPONINI in the last 168 hours. BNP (last 3 results) No results for input(s): PROBNP in the last 8760 hours. HbA1C: No results for input(s): HGBA1C in the last 72 hours. CBG: Recent Labs  Lab 06/18/21 0613 06/18/21 1133 06/18/21 1645 06/18/21 2116 06/19/21 0633  GLUCAP 72 70 85 130* 91    Lipid Profile: No results for input(s): CHOL, HDL, LDLCALC, TRIG, CHOLHDL, LDLDIRECT in the last 72 hours. Thyroid Function Tests: No results for input(s): TSH, T4TOTAL, FREET4, T3FREE, THYROIDAB in the last 72 hours. Anemia Panel: No results for input(s): VITAMINB12, FOLATE, FERRITIN, TIBC, IRON, RETICCTPCT in the last 72 hours. Sepsis Labs: No results for input(s): PROCALCITON, LATICACIDVEN in the last 168 hours.  Recent Results (from the past  240 hour(s))  Resp Panel by RT-PCR (Flu A&B, Covid) Nasopharyngeal Swab     Status: None   Collection Time: 06/11/21  5:29 PM   Specimen: Nasopharyngeal Swab; Nasopharyngeal(NP) swabs in vial transport medium  Result Value Ref Range Status   SARS Coronavirus 2 by RT PCR NEGATIVE NEGATIVE Final    Comment: (NOTE) SARS-CoV-2 target nucleic acids are NOT DETECTED.  The SARS-CoV-2 RNA is generally detectable in upper respiratory specimens during the acute phase of infection. The lowest concentration of SARS-CoV-2 viral copies this assay can detect is 138 copies/mL. A negative result does not preclude SARS-Cov-2 infection and should not be used as the sole basis for treatment or other patient management decisions. A negative result may occur with  improper specimen collection/handling, submission of specimen other than nasopharyngeal swab, presence of viral mutation(s) within the areas targeted by this assay, and inadequate number of viral copies(<138 copies/mL). A negative result must be combined with clinical observations, patient history, and epidemiological information. The expected result is Negative.  Fact Sheet for Patients:  EntrepreneurPulse.com.au  Fact Sheet for Healthcare Providers:  IncredibleEmployment.be  This test is no t yet approved or cleared by the [place] FDA and  has been authorized for detection and/or diagnosis of SARS-CoV-2 by FDA under an Emergency Use Authorization (EUA). This EUA will remain  in effect (meaning this test can be used) for the duration of the COVID-19 declaration under Section 564(b)(1) of the Act, 21 U.S.C.section 360bbb-3(b)(1), unless the authorization is terminated  or revoked sooner.       Influenza A by PCR NEGATIVE NEGATIVE Final   Influenza B by PCR NEGATIVE NEGATIVE Final    Comment: (NOTE) The Xpert Xpress SARS-CoV-2/FLU/RSV plus assay is intended as an aid in the diagnosis of influenza  from Nasopharyngeal swab specimens and should not be used as a sole basis for treatment. Nasal washings and aspirates are unacceptable for Xpert Xpress SARS-CoV-2/FLU/RSV testing.  Fact Sheet for Patients: EntrepreneurPulse.com.au  Fact Sheet for Healthcare Providers: IncredibleEmployment.be  This test is not yet approved or cleared by the [place] FDA and has been authorized for detection and/or diagnosis of SARS-CoV-2 by FDA under an Emergency Use Authorization (EUA). This EUA will remain in effect (meaning this test can be used) for the duration of the COVID-19 declaration  under Section 564(b)(1) of the Act, 21 U.S.C. section 360bbb-3(b)(1), unless the authorization is terminated or revoked.  Performed at [facility], [place] [address] [place], [place] [address]   MRSA Next Gen by PCR, Nasal     Status: None   Collection Time: 06/11/21  7:01 PM   Specimen: Nasal Mucosa; Nasal Swab  Result Value Ref Range Status   MRSA by PCR Next Gen NOT DETECTED NOT DETECTED Final    Comment: (NOTE) The GeneXpert MRSA Assay (FDA approved for NASAL specimens only), is one component of a comprehensive MRSA colonization surveillance program. It is not intended to diagnose MRSA infection nor to guide or monitor treatment for MRSA infections. Test performance is not FDA approved in patients less than 21 years old. Performed at [facility], [place] [address] [place], [place] [address]        Radiology Studies: No results found.  Scheduled Meds:  amiodarone  400 mg Oral BID   apixaban  5 mg Oral BID   docusate sodium  100 mg Oral BID   insulin aspart  0-15 Units Subcutaneous TID WC   insulin aspart  0-5 Units Subcutaneous QHS   insulin glargine-yfgn  26 Units Subcutaneous Q2200   isosorbide mononitrate  30 mg Oral Daily   levothyroxine  300 mcg Oral QAC breakfast   loratadine  5 mg Oral Daily   mometasone-formoterol  2 puff Inhalation  BID   nutrition supplement (JUVEN)  1 packet Oral BID BM   potassium chloride  40 mEq Oral BID   simvastatin  5 mg Oral Daily   sodium chloride flush  3 mL Intravenous Q12H   Continuous Infusions:  furosemide (LASIX) 200 mg in dextrose 5% 100 mL (2mg /mL) infusion 12 mg/hr (06/19/21 0300)     LOS: 7 days   Time spent: 26 minutes   Darliss Cheney, MD [facility]  06/19/2021, 8:29 AM  Please page via Amion and do not message via secure chat for anything urgent. Secure chat can be used for anything non urgent.  How to contact the [facility] Attending or Consulting provider [place] or covering provider during after hours [place], for this patient?  Check the care team in [facility] and look for a) attending/consulting TR[facility] provider listed and b) the [facility] team listed. Page or secure chat [phone]-[phone]. Log into [email or web address] and use [place] universal password to access. If you do not have the password, please contact the hospital operator. Locate the [facility] provider you are looking for under [facility] and page to a number that you can be directly reached. If you still have difficulty reaching the provider, please page the [facility] (Director on Call) for the Hospitalists listed on amion for assistance.

## 2021-06-19 NOTE — Plan of Care (Signed)

## 2021-06-19 NOTE — H&P (View-Only) (Signed)
Patient ID: [name] [name], male   DOB: [birth year], 75 y.o.   MRN: [id number]     Advanced Heart Failure Rounding Note  PCP-Cardiologist: Rozann Lesches, MD   Subjective:    Good diuresis yesterday with Lasix gtt 12 mg/hr + metolazone 2.5.  Weight down 6 lbs again.  However, creatinine up to 3.07.   Feels swollen, not short of breath at rest.    Objective:   Weight Range: 132.6 kg Body mass index is 38.57 kg/m.   Vital Signs:   Temp:  [97.3 F (36.3 C)-98.8 F (37.1 C)] 97.8 F (36.6 C) (11/17 0851) Pulse Rate:  [51-91] 57 (11/17 0723) Resp:  [13-20] 13 (11/17 0723) BP: (102-115)/(56-68) 102/66 (11/17 0723) SpO2:  [93 %-98 %] 94 % (11/17 0838) Weight:  [132.6 kg] 132.6 kg (11/17 0421) Last BM Date: 06/19/21  Weight change: Filed Weights   06/17/21 0147 06/18/21 0511 06/19/21 0421  Weight: (!) 138.5 kg 135.5 kg 132.6 kg    Intake/Output:   Intake/Output Summary (Last 24 hours) at 06/19/2021 0909 Last data filed at 06/19/2021 0852 Gross per 24 hour  Intake 1838 ml  Output 4650 ml  Net -2812 ml      Physical Exam    General: NAD Neck: Thick, JVP difficult, no thyromegaly or thyroid nodule.  Lungs: Clear to auscultation bilaterally with normal respiratory effort. CV: Nondisplaced PMI.  Heart irregular S1/S2, no S3/S4, no murmur.  2+ dependent edema lower extremities.  Abdomen: Soft, nontender, no hepatosplenomegaly, no distention.  Skin: Intact without lesions or rashes.  Neurologic: Alert and oriented x 3.  Psych: Normal affect. Extremities: No clubbing or cyanosis.  HEENT: Normal.    Telemetry   Atrial fibrillation rate 40s-50s (personally reviewed)  Labs    CBC No results for input(s): WBC, NEUTROABS, HGB, HCT, MCV, PLT in the last 72 hours. Basic Metabolic Panel Recent Labs    06/18/21 0846 06/19/21 0326  NA 139 136  K 3.8 3.3*  CL 97* 95*  CO2 32 31  GLUCOSE 107* 90  BUN 78* 89*  CREATININE 2.70* 3.07*  CALCIUM 8.9 9.0   Liver  Function Tests No results for input(s): AST, ALT, ALKPHOS, BILITOT, PROT, ALBUMIN in the last 72 hours. No results for input(s): LIPASE, AMYLASE in the last 72 hours. Cardiac Enzymes No results for input(s): CKTOTAL, CKMB, CKMBINDEX, TROPONINI in the last 72 hours.  BNP: BNP (last 3 results) Recent Labs    01/30/21 0312 06/04/21 1820 06/11/21 1338  BNP 267.0* 446.0* 199.1*    ProBNP (last 3 results) No results for input(s): PROBNP in the last 8760 hours.   D-Dimer No results for input(s): DDIMER in the last 72 hours. Hemoglobin A1C No results for input(s): HGBA1C in the last 72 hours. Fasting Lipid Panel No results for input(s): CHOL, HDL, LDLCALC, TRIG, CHOLHDL, LDLDIRECT in the last 72 hours. Thyroid Function Tests No results for input(s): TSH, T4TOTAL, T3FREE, THYROIDAB in the last 72 hours.  Invalid input(s): FREET3  Other results:   Imaging    No results found.   Medications:     Scheduled Medications:  amiodarone  400 mg Oral BID   apixaban  5 mg Oral BID   docusate sodium  100 mg Oral BID   insulin aspart  0-15 Units Subcutaneous TID WC   insulin aspart  0-5 Units Subcutaneous QHS   insulin glargine-yfgn  26 Units Subcutaneous Q2200   isosorbide mononitrate  30 mg Oral Daily   levothyroxine  300 mcg Oral  QAC breakfast   loratadine  5 mg Oral Daily   mometasone-formoterol  2 puff Inhalation BID   nutrition supplement (JUVEN)  1 packet Oral BID BM   potassium chloride  40 mEq Oral BID   simvastatin  5 mg Oral Daily   sodium chloride flush  3 mL Intravenous Q12H   sodium chloride flush  3 mL Intravenous Q12H    Infusions:  furosemide (LASIX) 200 mg in dextrose 5% 100 mL (2mg /mL) infusion 12 mg/hr (06/19/21 0300)    PRN Medications: acetaminophen **OR** acetaminophen, albuterol, alum & mag hydroxide-simeth, bisacodyl, hydrALAZINE, loperamide, morphine injection, ondansetron **OR** ondansetron (ZOFRAN) IV, oxyCODONE, polyethylene glycol, sodium  chloride, zolpidem    Assessment/Plan   1. Acute on chronic diastolic CHF: In setting of CKD stage IV and persistent atrial fibrillation.  Last echo in 5/22 with EF 55-60%, mild RV enlargement, normal RV systolic function. Admitted with marked volume overload. Diuresing better on Lasix gtt + metolazone.  Weight down again.  He has a lot of peripheral edema but JVP difficult to assess.  Creatinine higher at 3.07.  - Continue Unna boots. - With rise in creatinine and difficulty with JVP assessment, will get RHC today to decide on ongoing diuresis.  For now, will continue Lasix gtt 12 mg/hr.  Discussed risks/benefits of procedure with patient and he agrees.  - Would try to get him back into NSR (see below).  - May be able to eventually start Jardiance, follow GFR.  2. Atrial fibrillation: Persistent since 12/21, DCCV never attempted.  Suspect he will not stay in NSR without anti-arrhythmic.  I would like to see if regaining NSR would help with CHF management.  - Bisoprolol stopped with bradycardia.  - Continue amiodarone 400 mg bid.  - Eventual TEE-guided DCCV (he missed at least 1 dose of apixaban last week).  - Continue apixaban 5 mg bid.  3. CAD: S/p NSTEMI with occluded OM1 in 6/18, had DES to 80% stenosis mLAD at that time.  No further chest pain.  - No coronary angiography in absence of ACS given CKD stage 4.  - Continue statin.  - No ASA given Eliquis use.  4. CKD: Stage IV.  Creatinine higher at 3.07 today.  - RHC as above to determine cardiac filling pressures.  5. Type 2 DM: Continue insulin.  6. Chronic hypoxemic respiratory failure: He is on 2L home oxygen.  ?OHS/OSA, ?COPD.  He smoked in the past but not heavily.  - Would get PFTs after diuresis.  - Needs home sleep study.   Length of Stay: 7  [name], MD  06/19/2021, 9:09 AM  Advanced Heart Failure Team Pager [phone] (M-F; 7a - 5p)  Please contact [facility] for night-coverage after hours (5p -7a ) and  weekends on [email or web address]

## 2021-06-20 ENCOUNTER — Inpatient Hospital Stay (HOSPITAL_COMMUNITY): Payer: Medicare HMO

## 2021-06-20 ENCOUNTER — Inpatient Hospital Stay (HOSPITAL_COMMUNITY): Payer: Medicare HMO | Admitting: Anesthesiology

## 2021-06-20 ENCOUNTER — Encounter (HOSPITAL_COMMUNITY): Payer: Self-pay | Admitting: Family Medicine

## 2021-06-20 ENCOUNTER — Encounter (HOSPITAL_COMMUNITY)
Admission: EM | Disposition: A | Discharge status: Home Health | Payer: Self-pay | Source: Home / Self Care | Attending: Family Medicine

## 2021-06-20 DIAGNOSIS — I5033 Acute on chronic diastolic (congestive) heart failure: Secondary | ICD-10-CM | POA: Diagnosis not present

## 2021-06-20 DIAGNOSIS — E039 Hypothyroidism, unspecified: Secondary | ICD-10-CM

## 2021-06-20 DIAGNOSIS — I4891 Unspecified atrial fibrillation: Secondary | ICD-10-CM

## 2021-06-20 DIAGNOSIS — E1122 Type 2 diabetes mellitus with diabetic chronic kidney disease: Secondary | ICD-10-CM

## 2021-06-20 DIAGNOSIS — I351 Nonrheumatic aortic (valve) insufficiency: Secondary | ICD-10-CM

## 2021-06-20 HISTORY — PX: TEE WITHOUT CARDIOVERSION: SHX5443

## 2021-06-20 HISTORY — PX: CARDIOVERSION: SHX1299

## 2021-06-20 LAB — BASIC METABOLIC PANEL
Anion gap: 10 (ref 5–15)
BUN: 102 mg/dL — ABNORMAL HIGH (ref 8–23)
CO2: 32 mmol/L (ref 22–32)
Calcium: 9.2 mg/dL (ref 8.9–10.3)
Chloride: 96 mmol/L — ABNORMAL LOW (ref 98–111)
Creatinine, Ser: 3.32 mg/dL — ABNORMAL HIGH (ref 0.61–1.24)
GFR, Estimated: 19 mL/min — ABNORMAL LOW (ref >60)
Glucose, Bld: 118 mg/dL — ABNORMAL HIGH (ref 70–99)
Potassium: 3.4 mmol/L — ABNORMAL LOW (ref 3.5–5.1)
Sodium: 138 mmol/L (ref 135–145)

## 2021-06-20 LAB — GLUCOSE, CAPILLARY
Glucose-Capillary: 91 mg/dL (ref 70–99)
Glucose-Capillary: 75 mg/dL (ref 70–99)
Glucose-Capillary: 68 mg/dL — ABNORMAL LOW (ref 70–99)
Glucose-Capillary: 96 mg/dL (ref 70–99)
Glucose-Capillary: 89 mg/dL (ref 70–99)

## 2021-06-20 LAB — MAGNESIUM: Magnesium: 2.4 mg/dL (ref 1.7–2.4)

## 2021-06-20 LAB — URIC ACID: Uric Acid, Serum: 13.1 mg/dL — ABNORMAL HIGH (ref 3.7–8.6)

## 2021-06-20 SURGERY — ECHOCARDIOGRAM, TRANSESOPHAGEAL
Anesthesia: Monitor Anesthesia Care

## 2021-06-20 MED ORDER — TECHNETIUM TO 99M ALBUMIN AGGREGATED
4.4000 | Freq: Once | INTRAVENOUS | Status: AC | PRN
  Administered 2021-06-20: 4.4 via INTRAVENOUS

## 2021-06-20 MED ORDER — POTASSIUM CHLORIDE CRYS ER 20 MEQ PO TBCR
40.0000 meq | EXTENDED_RELEASE_TABLET | Freq: Three times a day (TID) | ORAL | Status: AC
  Administered 2021-06-20 (×2): 40 meq via ORAL
  Filled 2021-06-20 (×2): qty 2

## 2021-06-20 MED ORDER — DEXTROSE 50 % IV SOLN
INTRAVENOUS | Status: DC | PRN
  Administered 2021-06-20: 12.5 mL via INTRAVENOUS

## 2021-06-20 MED ORDER — PROPOFOL 500 MG/50ML IV EMUL
INTRAVENOUS | Status: DC | PRN
  Administered 2021-06-20: 80 ug/kg/min via INTRAVENOUS

## 2021-06-20 MED ORDER — LIDOCAINE HCL (CARDIAC) PF 100 MG/5ML IV SOSY
PREFILLED_SYRINGE | INTRAVENOUS | Status: DC | PRN
  Administered 2021-06-20: 100 mg via INTRATRACHEAL
  Administered 2021-06-20: 50 mg via INTRATRACHEAL

## 2021-06-20 MED ORDER — DEXTROSE 50 % IV SOLN
INTRAVENOUS | Status: AC
  Filled 2021-06-20: qty 50

## 2021-06-20 MED ORDER — EPHEDRINE SULFATE 50 MG/ML IJ SOLN
INTRAMUSCULAR | Status: DC | PRN
  Administered 2021-06-20: 10 mg via INTRAVENOUS

## 2021-06-20 MED ORDER — AMIODARONE HCL 200 MG PO TABS
400.0000 mg | ORAL_TABLET | Freq: Every day | ORAL | Status: DC
  Administered 2021-06-20 – 2021-06-26 (×7): 400 mg via ORAL
  Filled 2021-06-20 (×7): qty 2

## 2021-06-20 MED ORDER — GLYCOPYRROLATE 0.2 MG/ML IJ SOLN
INTRAMUSCULAR | Status: DC | PRN
  Administered 2021-06-20: .1 mg via INTRAVENOUS

## 2021-06-20 MED ORDER — SODIUM CHLORIDE 0.9 % IV SOLN: INTRAVENOUS | Status: DC

## 2021-06-20 MED ORDER — PHENYLEPHRINE HCL (PRESSORS) 10 MG/ML IV SOLN
INTRAVENOUS | Status: DC | PRN
  Administered 2021-06-20 (×2): 160 ug via INTRAVENOUS
  Administered 2021-06-20 (×2): 80 ug via INTRAVENOUS

## 2021-06-20 NOTE — Anesthesia Preprocedure Evaluation (Signed)
Anesthesia Evaluation  Patient identified by MRN, date of birth, ID band Patient awake    Reviewed: Allergy & Precautions, H&P , NPO status , Patient's Chart, lab work & pertinent test results  History of Anesthesia Complications Negative for: history of anesthetic complications  Airway Mallampati: II  TM Distance: >3 FB Neck ROM: Full    Dental  (+) Edentulous Upper, Dental Advisory Given   Pulmonary neg pulmonary ROS, shortness of breath, sleep apnea , pneumonia, former smoker,    Pulmonary exam normal breath sounds clear to auscultation       Cardiovascular hypertension, + CAD, + Past MI, + Cardiac Stents, + Peripheral Vascular Disease and +CHF  + dysrhythmias Atrial Fibrillation  Rhythm:Regular Rate:Normal  Study Conclusions  - Left ventricle: The cavity size was mildly to moderately dilated.   Wall thickness was increased in a pattern of moderate LVH.   Systolic function was normal. The estimated ejection fraction was   in the range of 50% to 55%. Features are consistent with a   pseudonormal left ventricular filling pattern, with concomitant   abnormal relaxation and increased filling pressure (grade 2   diastolic dysfunction). Doppler parameters are consistent with   high ventricular filling pressure.   Neuro/Psych negative neurological ROS  negative psych ROS   GI/Hepatic negative GI ROS, Neg liver ROS,   Endo/Other  diabetes, Type 2, Insulin Dependent, Oral Hypoglycemic AgentsHypothyroidism Morbid obesity  Renal/GU Renal Insufficiency and CRFRenal disease  negative genitourinary   Musculoskeletal  (+) Arthritis ,   Abdominal   Peds  Hematology negative hematology ROS (+)   Anesthesia Other Findings   Reproductive/Obstetrics negative OB ROS                             Anesthesia Physical  Anesthesia Plan  ASA: 4  Anesthesia Plan: MAC   Post-op Pain Management: Minimal  or no pain anticipated   Induction: Intravenous  PONV Risk Score and Plan: 2 and Propofol infusion and Treatment may vary due to age or medical condition  Airway Management Planned: Simple Face Mask, Nasal Cannula and Natural Airway  Additional Equipment: None  Intra-op Plan:   Post-operative Plan:   Informed Consent: I have reviewed the patients History and Physical, chart, labs and discussed the procedure including the risks, benefits and alternatives for the proposed anesthesia with the patient or authorized representative who has indicated his/her understanding and acceptance.     Dental advisory given  Plan Discussed with: CRNA and Anesthesiologist  Anesthesia Plan Comments: (HPI: [name] [name] is a 75 y.o. male with medical history significant of afib; CAD s/p stent; stage 3 CKD; HTN; HLD; COPD; hypothyroidism; morbid obesity; NHL treated with XRT and chemotherapy); DM; and OSA presenting with SOB.  He was last seen in the ER on 11/2 for SOB and LE edema; this was thought to be multifactorial due to COPD and CHF and he was given steroids, antibiotics, and an inhaler and discharged with ongoing dosing of current Torsemide dose.)        Anesthesia Quick Evaluation

## 2021-06-20 NOTE — Progress Notes (Addendum)
PROGRESS NOTE    [name] [name]  JQZ:[id number] DOB: 08-Apr-1946 DOA: 06/11/2021 PCP: Nickola Major, MD   Brief Narrative:  [name] [name] is a 75 y.o. male with medical history significant of afib; CAD s/p stent; stage 3 CKD; HTN; HLD; COPD; hypothyroidism; morbid obesity; NHL treated with XRT and chemotherapy); DM; and OSA presented with SOB.  He was using his home O2 of 3 to 4 L but was still feeling SOB.  Occasional PND, unchanged from prior.  He sleeps in a recliner.  His weight was up about 9 pounds upon admission.  He misses 2-3 doses a week of medications.  Upon arrival, he was in acute on chronic hypoxic respiratory failure, desatted to 60s on 4 L.  Placed on nonrebreather but quickly weaned down to 4 L.  Was admitted with acute on chronic diastolic congestive heart failure.  Has been on IV dose of Lasix with very minimal urine output and no much improvement in fluid overload/peripheral edema.  Consulted heart failure team.  Patient underwent right heart cath 06/19/2021 and is scheduled for TEE/DCCV today.  Assessment & Plan:   Principal Problem:   Acute on chronic diastolic CHF (congestive heart failure) (HCC) Active Problems:   Chronic kidney disease Stage IV   Morbid obesity (HCC)   Type 2 diabetes mellitus with stage 4 chronic kidney disease (HCC)   Hypothyroidism   Benign essential hypertension   AF (paroxysmal atrial fibrillation) (HCC)   Chronic respiratory failure with hypoxia and hypercapnia (HCC)   CHF (congestive heart failure) (HCC)  Chronic hypoxic respiratory failure/acute on chronic diastolic CHF: Echo 01/23/6332 shows 55 to 60% ejection fraction.  Uses 3 to 4 L of oxygen at home, currently on 4 L.  Despite of high dose of IV Lasix bolus doses, he did not have significant urine output and did not have improvement in volume overload.  Cardiology consulted, he was started on Lasix infusion plus metolazone.  He has had significant urine output and has lost almost 9-10 L  since then/past 3 days.  He has had another 24 hours of robust urine output with 3.1 L last 24 hours with a net negative of 11.6 L so far.  Weight down of about 2.5 pounds more. [place] 11/17 - RA mean, PCWP mean 18, moderate to severe PAH with PVR 4 WU, CI low at 1.94.    COPD with possible acute exacerbation: No more wheezes.  Completed 5 days of prednisone.   Possible OSA: Patient tells me that he had sleep study done several years ago but was never prescribed CPAP machine.  He tells me that he is under the impression that he is scheduled to have sleep study as outpatient.  I advised him to make sure that he is otherwise scheduled 1 for him.  Continue CPAP while he is here.    Permanent atrial fibrillation: Rates controlled.  Bisoprolol was stopped and he was started on amiodarone on 06/17/2021.  He is scheduled for TEE/DCCV today.  Continue Eliquis.   HTN: Controlled.  Continue bisoprolol and as needed hydralazine.  Hypokalemia: Low again.  Continue to replace.  HLD -Continue Zocor   DM type II: -Last A1c was 5.8, indicating good control.  Blood sugar controlled with current dose of Tresiba and SSI.  CAD -s/p stent -Continue Imdur.  Currently asymptomatic.   Hypothyroidism: Continue Synthroid.   Morbid obesity -BMI is 41.4  -Weight loss encouraged. -Outpatient PCP/bariatric medicine f/u encouraged    NHL  -treated with  XRT and chemotherapy -He is now doing annual f/u, due again in 09/2021   Stage 4 CKD: Slight jump in creatinine yet once again today 2.5 > 2.7 > 3.1 > 3.3.  It looks like cardiology has stopped his Lasix infusion now.  Chronic bilateral lower extremity wounds: Patient manages them himself at home.  Nursing to manage them while here.  Unna boot ordered.  Diarrhea: Resolved.  Imodium as needed.  Left lower extremity pain and swelling: Complains of pain in the left lower extremity since yesterday.  On examination, left lower extremity is much more edematous than the  right lower extremity, mostly in thigh and left knee.  It is significantly tender to touch and warm to touch as well.  No calf tenderness, I doubt DVT because of that and the fact that he is already on anticoagulation.  Do not think it could be gout either.  Will obtain CT left lower extremity to further investigate.  DVT prophylaxis: Place TED hose Start: 06/14/21 0958   Code Status: Full Code  Family Communication:  None present at bedside.  Plan of care discussed with patient in length and he verbalized understanding and agreed with it.  Status is: Inpatient  Remains inpatient appropriate because: Needs more inpatient management of acute CHF.  Estimated body mass index is 38.1 kg/m as calculated from the following:   Height as of this encounter: 6\' 1"  (1.854 m).   Weight as of this encounter: 131 kg.     Nutritional Assessment: Body mass index is 38.1 kg/m.Marland Kitchen Seen by dietician.  I agree with the assessment and plan as outlined below: Nutrition Status: Nutrition Problem: Increased nutrient needs Etiology: chronic illness (acute on chronic CHF) Signs/Symptoms: estimated needs Interventions: Liberalize Diet  .  Skin Assessment: I have examined the patient's skin and I agree with the wound assessment as performed by the wound care RN as outlined below:    Consultants:  Heart failure team  Procedures:  None  Antimicrobials:  Anti-infectives (From admission, onward)    None          Subjective:  Patient seen and examined.  He complains of pain in the left lower extremity, starting from hip, all the way below the knee.  No shortness of breath.  No other complaint.  Objective: Vitals:   06/19/21 2324 06/20/21 0302 06/20/21 0721 06/20/21 0732  BP: 118/63 (!) 119/96 111/72   Pulse: 60 60 (!) 44   Resp: 16 16 14    Temp: 98.1 F (36.7 C) 97.6 F (36.4 C) 97.6 F (36.4 C)   TempSrc: Oral Oral Oral   SpO2: 97% 96% 96% 97%  Weight:  131 kg    Height:         Intake/Output Summary (Last 24 hours) at 06/20/2021 0908 Last data filed at 06/20/2021 0726 Gross per 24 hour  Intake 293.28 ml  Output 2670 ml  Net -2376.72 ml    Filed Weights   06/18/21 0511 06/19/21 0421 06/20/21 0302  Weight: 135.5 kg 132.6 kg 131 kg    Examination:  General exam: Appears calm and comfortable  Respiratory system: Clear to auscultation. Respiratory effort normal. Cardiovascular system: S1 & S2 heard, irregularly irregular rate and rhythm.. No JVD, murmurs, rubs, gallops or clicks.  +3 pitting edema bilateral lower extremity, up to torso. Gastrointestinal system: Abdomen is nondistended, soft and nontender. No organomegaly or masses felt. Normal bowel sounds heard. Central nervous system: Alert and oriented. No focal neurological deficits. Extremities: Symmetric 5 x  5 power.  Left lower extremity swollen compared to the right and it is warm and tender to touch. Skin: No rashes, lesions or ulcers.  Psychiatry: Judgement and insight appear normal. Mood & affect appropriate.    Data Reviewed: I have personally reviewed following labs and imaging studies  CBC: Recent Labs  Lab 06/19/21 1238  HGB 14.3  14.3  HCT 42.0  73.2    Basic Metabolic Panel: Recent Labs  Lab 06/14/21 0644 06/15/21 0121 06/16/21 0313 06/17/21 0055 06/18/21 0846 06/19/21 0326 06/19/21 1238 06/20/21 0101  NA 139   < > 136 137 139 136 139  139 138  K 4.4   < > 4.6 3.2* 3.8 3.3* 3.4*  3.5 3.4*  CL 103   < > 102 101 97* 95*  --  96*  CO2 28   < > 23 24 32 31  --  32  GLUCOSE 83   < > 128* 83 107* 90  --  118*  BUN 54*   < > 67* 72* 78* 89*  --  102*  CREATININE 2.81*   < > 2.48* 2.48* 2.70* 3.07*  --  3.32*  CALCIUM 9.2   < > 8.6* 8.6* 8.9 9.0  --  9.2  MG 2.3  --   --   --   --   --   --   --    < > = values in this interval not displayed.    GFR: Estimated Creatinine Clearance: 27.7 mL/min (A) (by C-G formula based on SCr of 3.32 mg/dL (H)). Liver Function  Tests: No results for input(s): AST, ALT, ALKPHOS, BILITOT, PROT, ALBUMIN in the last 168 hours.  No results for input(s): LIPASE, AMYLASE in the last 168 hours. No results for input(s): AMMONIA in the last 168 hours. Coagulation Profile: No results for input(s): INR, PROTIME in the last 168 hours. Cardiac Enzymes: No results for input(s): CKTOTAL, CKMB, CKMBINDEX, TROPONINI in the last 168 hours. BNP (last 3 results) No results for input(s): PROBNP in the last 8760 hours. HbA1C: No results for input(s): HGBA1C in the last 72 hours. CBG: Recent Labs  Lab 06/19/21 1114 06/19/21 1133 06/19/21 1552 06/19/21 2113 06/20/21 0603  GLUCAP 114* 96 149* 126* 91    Lipid Profile: No results for input(s): CHOL, HDL, LDLCALC, TRIG, CHOLHDL, LDLDIRECT in the last 72 hours. Thyroid Function Tests: No results for input(s): TSH, T4TOTAL, FREET4, T3FREE, THYROIDAB in the last 72 hours. Anemia Panel: No results for input(s): VITAMINB12, FOLATE, FERRITIN, TIBC, IRON, RETICCTPCT in the last 72 hours. Sepsis Labs: No results for input(s): PROCALCITON, LATICACIDVEN in the last 168 hours.  Recent Results (from the past 240 hour(s))  Resp Panel by RT-PCR (Flu A&B, Covid) Nasopharyngeal Swab     Status: None   Collection Time: 06/11/21  5:29 PM   Specimen: Nasopharyngeal Swab; Nasopharyngeal(NP) swabs in vial transport medium  Result Value Ref Range Status   SARS Coronavirus 2 by RT PCR NEGATIVE NEGATIVE Final    Comment: (NOTE) SARS-CoV-2 target nucleic acids are NOT DETECTED.  The SARS-CoV-2 RNA is generally detectable in upper respiratory specimens during the acute phase of infection. The lowest concentration of SARS-CoV-2 viral copies this assay can detect is 138 copies/mL. A negative result does not preclude SARS-Cov-2 infection and should not be used as the sole basis for treatment or other patient management decisions. A negative result may occur with  improper specimen  collection/handling, submission of specimen other than nasopharyngeal swab, presence of viral  mutation(s) within the areas targeted by this assay, and inadequate number of viral copies(<138 copies/mL). A negative result must be combined with clinical observations, patient history, and epidemiological information. The expected result is Negative.  Fact Sheet for Patients:  EntrepreneurPulse.com.au  Fact Sheet for Healthcare Providers:  IncredibleEmployment.be  This test is no t yet approved or cleared by the [place] FDA and  has been authorized for detection and/or diagnosis of SARS-CoV-2 by FDA under an Emergency Use Authorization (EUA). This EUA will remain  in effect (meaning this test can be used) for the duration of the COVID-19 declaration under Section 564(b)(1) of the Act, 21 U.S.C.section 360bbb-3(b)(1), unless the authorization is terminated  or revoked sooner.       Influenza A by PCR NEGATIVE NEGATIVE Final   Influenza B by PCR NEGATIVE NEGATIVE Final    Comment: (NOTE) The Xpert Xpress SARS-CoV-2/FLU/RSV plus assay is intended as an aid in the diagnosis of influenza from Nasopharyngeal swab specimens and should not be used as a sole basis for treatment. Nasal washings and aspirates are unacceptable for Xpert Xpress SARS-CoV-2/FLU/RSV testing.  Fact Sheet for Patients: EntrepreneurPulse.com.au  Fact Sheet for Healthcare Providers: IncredibleEmployment.be  This test is not yet approved or cleared by the [place] FDA and has been authorized for detection and/or diagnosis of SARS-CoV-2 by FDA under an Emergency Use Authorization (EUA). This EUA will remain in effect (meaning this test can be used) for the duration of the COVID-19 declaration under Section 564(b)(1) of the Act, 21 U.S.C. section 360bbb-3(b)(1), unless the authorization is terminated or revoked.  Performed at [facility], [place] [address], [place], [place] [address]   MRSA Next Gen by PCR, Nasal     Status: None   Collection Time: 06/11/21  7:01 PM   Specimen: Nasal Mucosa; Nasal Swab  Result Value Ref Range Status   MRSA by PCR Next Gen NOT DETECTED NOT DETECTED Final    Comment: (NOTE) The GeneXpert MRSA Assay (FDA approved for NASAL specimens only), is one component of a comprehensive MRSA colonization surveillance program. It is not intended to diagnose MRSA infection nor to guide or monitor treatment for MRSA infections. Test performance is not FDA approved in patients less than 68 years old. Performed at [facility], [place] [address] [place], [place] [address]        Radiology Studies: CARDIAC CATHETERIZATION  Result Date: 06/19/2021 1. Right and left heart filling pressures are mildly elevated. 2. Moderate-severe PAH with PVR 4 WU. 3. Low cardiac index, 1.94. Will continue diuresis with IV Lasix infusion today but hold off on metolazone with rising creatinine and filling pressures not markedly high. PH is likely from OHS/OSA, but will arrange for V/Q scan to rule out chronic PE.   ECHOCARDIOGRAM COMPLETE  Result Date: 06/19/2021    ECHOCARDIOGRAM REPORT   Patient Name:   [name] [name] Date of Exam: 06/19/2021 Medical Rec #:  [id number]     Height:       73.0 in Accession #:    [id number]    Weight:       292.3 lb Date of Birth:  04-Jun-1946     BSA:          2.528 m Patient Age:    50 years      BP:           102/67 mmHg Patient Gender: M             HR:  47 bpm. Exam Location:  Inpatient Procedure: 2D Echo, Cardiac Doppler, Color Doppler and Intracardiac            Opacification Agent Indications:    CHF-Acute Diastolic J82.50  History:        Patient has prior history of Echocardiogram examinations, most                 recent 12/28/2020. CAD and Previous Myocardial Infarction,                 Arrythmias:LBBB and Atrial Fibrillation; Risk                  Factors:Dyslipidemia, Hypertension, Sleep Apnea and Diabetes.                 Thyroid disease.  Sonographer:    Darlina Sicilian RDCS Referring Phys: [place]  1. Left ventricular ejection fraction, by estimation, is 45 to 50%. The left ventricle has mildly decreased function. The left ventricle demonstrates global hypokinesis. The left ventricular internal cavity size was severely dilated. Left ventricular diastolic function could not be evaluated.  2. Right ventricular systolic function is moderately reduced. The right ventricular size is not well visualized. There is moderately elevated pulmonary artery systolic pressure.  3. Left atrial size was moderately dilated.  4. The mitral valve is grossly normal. Trivial mitral valve regurgitation. No evidence of mitral stenosis.  5. The aortic valve is grossly normal. There is mild calcification of the aortic valve. Aortic valve regurgitation is trivial. Aortic valve sclerosis is present, with no evidence of aortic valve stenosis.  6. Aortic dilatation noted. There is borderline dilatation of the ascending aorta, measuring 39 mm.  7. The inferior vena cava is dilated in size with <50% respiratory variability, suggesting right atrial pressure of 15 mmHg. Comparison(s): Changes from prior study are noted. EF slightly decreased (globally) compared to prior. Conclusion(s)/Recommendation(s): Significant bradycardia, down to 40 bpm, noted during study. FINDINGS  Left Ventricle: Left ventricular ejection fraction, by estimation, is 45 to 50%. The left ventricle has mildly decreased function. The left ventricle demonstrates global hypokinesis. Definity contrast agent was given IV to delineate the left ventricular  endocardial borders. The left ventricular internal cavity size was severely dilated. There is no left ventricular hypertrophy. Left ventricular diastolic function could not be evaluated due to atrial fibrillation. Left ventricular diastolic  function could not be evaluated. Right Ventricle: The right ventricular size is not well visualized. Right vetricular wall thickness was not well visualized. Right ventricular systolic function is moderately reduced. There is moderately elevated pulmonary artery systolic pressure. The tricuspid regurgitant velocity is 3.07 m/s, and with an assumed right atrial pressure of 15 mmHg, the estimated right ventricular systolic pressure is 53.9 mmHg. Left Atrium: Left atrial size was moderately dilated. Right Atrium: Right atrial size was not well visualized. Pericardium: There is no evidence of pericardial effusion. Mitral Valve: The mitral valve is grossly normal. Trivial mitral valve regurgitation. No evidence of mitral valve stenosis. Tricuspid Valve: The tricuspid valve is grossly normal. Tricuspid valve regurgitation is trivial. No evidence of tricuspid stenosis. Aortic Valve: The aortic valve is grossly normal. There is mild calcification of the aortic valve. Aortic valve regurgitation is trivial. Aortic valve sclerosis is present, with no evidence of aortic valve stenosis. Pulmonic Valve: The pulmonic valve was not well visualized. Pulmonic valve regurgitation is trivial. No evidence of pulmonic stenosis. Aorta: Aortic dilatation noted. There is borderline dilatation of the ascending aorta, measuring  39 mm. Venous: The inferior vena cava is dilated in size with less than 50% respiratory variability, suggesting right atrial pressure of 15 mmHg. IAS/Shunts: The interatrial septum was not well visualized.  LEFT VENTRICLE PLAX 2D LVIDd:         6.40 cm LVIDs:         4.90 cm LV PW:         1.00 cm LV IVS:        1.10 cm LVOT diam:     2.30 cm LV SV:         99 LV SV Index:   39 LVOT Area:     4.15 cm  LV Volumes (MOD) LV vol d, MOD A2C: 162.0 ml LV vol d, MOD A4C: 183.0 ml LV vol s, MOD A2C: 95.1 ml LV vol s, MOD A4C: 84.9 ml LV SV MOD A2C:     66.9 ml LV SV MOD A4C:     183.0 ml LV SV MOD BP:      83.6 ml RIGHT  VENTRICLE RV S prime:     6.98 cm/s TAPSE (M-mode): 1.3 cm LEFT ATRIUM              Index LA diam:        4.90 cm  1.94 cm/m LA Vol (A2C):   68.8 ml  27.21 ml/m LA Vol (A4C):   104.0 ml 41.13 ml/m LA Biplane Vol: 90.7 ml  35.87 ml/m  AORTIC VALVE LVOT Vmax:   124.00 cm/s LVOT Vmean:  96.900 cm/s LVOT VTI:    0.238 m  AORTA Ao Root diam: 2.70 cm Ao Asc diam:  3.90 cm MITRAL VALVE               TRICUSPID VALVE MV Area (PHT): 3.61 cm    TR Peak grad:   37.7 mmHg MV Decel Time: 210 msec    TR Vmax:        307.00 cm/s MV E velocity: 92.95 cm/s                            SHUNTS                            Systemic VTI:  0.24 m                            Systemic Diam: 2.30 cm Buford Dresser MD Electronically signed by Buford Dresser MD Signature Date/Time: 06/19/2021/4:34:56 PM    Final     Scheduled Meds:  amiodarone  400 mg Oral Daily   apixaban  5 mg Oral BID   docusate sodium  100 mg Oral BID   insulin aspart  0-15 Units Subcutaneous TID WC   insulin aspart  0-5 Units Subcutaneous QHS   insulin glargine-yfgn  26 Units Subcutaneous Q2200   isosorbide mononitrate  30 mg Oral Daily   levothyroxine  300 mcg Oral QAC breakfast   loratadine  5 mg Oral Daily   mometasone-formoterol  2 puff Inhalation BID   nutrition supplement (JUVEN)  1 packet Oral BID BM   potassium chloride  40 mEq Oral TID WC   simvastatin  5 mg Oral Daily   sodium chloride flush  3 mL Intravenous Q12H   sodium chloride flush  3 mL Intravenous Q12H   Continuous Infusions:  sodium chloride 20 mL/hr  at 06/20/21 0656     LOS: 8 days   Time spent: 29 minutes   Darliss Cheney, MD [facility]  06/20/2021, 9:08 AM  Please page via [place] and do not message via secure chat for anything urgent. Secure chat can be used for anything non urgent.  How to contact the [facility] Attending or Consulting provider [place] or covering provider during after hours [place], for this patient?  Check the care team in [facility] and look  for a) attending/consulting [facility] provider listed and b) the [facility] team listed. Page or secure chat [phone]. Log into [email or web address] and use [place]'s universal password to access. If you do not have the password, please contact the hospital operator. Locate the [facility] provider you are looking for under [facility] and page to a number that you can be directly reached. If you still have difficulty reaching the provider, please page the [facility] (Director on Call) for the Hospitalists listed on amion for assistance.

## 2021-06-20 NOTE — Progress Notes (Signed)
Physical Therapy Treatment Patient Details Name: [name] [name] MRN: [id number] DOB: [birth year] Today's Date: 06/20/2021   History of Present Illness Pt is a 75 yo male presenting 06/11/21 with LE edema and SOB; workup for CHF exacerbation. PMH includes: afib, CAD, CKD III, HTN, HLD, LBBB, NSTEMI (2018), DM II, sleep apnea (sleeps in recliner), morbid obesity, and neuropathy. Uses 3-4L O2 at baseline.    PT Comments    The pt was able to make good progress with exercises in the room at this date as he requested limited session while awaiting transport for procedure later this afternoon. The pt was able to demo good progress with independence and technique for sit-stand transfers with increased power up through BLE rather than dependence on UE. The pt will continue to benefit from skilled PT to improve OOB stability and LE strength, but continue to recommend d/c home once medically stable.      Recommendations for follow up therapy are one component of a multi-disciplinary discharge planning process, led by the attending physician.  Recommendations may be updated based on patient status, additional functional criteria and insurance authorization.  Follow Up Recommendations  Home health PT     Assistance Recommended at Discharge Intermittent Supervision/Assistance  Equipment Recommendations  None recommended by PT    Recommendations for Other Services       Precautions / Restrictions Precautions Precautions: Fall Precaution Comments: monitor O2 (wears 3-4 L O2 at baseline) Restrictions Weight Bearing Restrictions: No     Mobility  Bed Mobility Overal bed mobility: Needs Assistance             General bed mobility comments: pt received sitting EOB    Transfers Overall transfer level: Needs assistance Equipment used: Rolling walker (2 wheels);None Transfers: Sit to/from Stand;Bed to chair/wheelchair/BSC Sit to Stand: Min assist;Supervision     Step pivot transfers: Min  guard     General transfer comment: minA at times without UE support, pt completed x15 through session and was able to complete with improved independence from low surface and with reduced dependence on UE support    Ambulation/Gait Ambulation/Gait assistance: Supervision Gait Distance (Feet): 5 Feet (within room, limited as pt awaiting transport to procedure) Assistive device: Rolling walker (2 wheels) Gait Pattern/deviations: Step-through pattern;Wide base of support               Balance Overall balance assessment: Needs assistance Sitting-balance support: No upper extremity supported;Feet supported Sitting balance-Leahy Scale: Good Sitting balance - Comments: pt able to maintain without UE support or back support   Standing balance support: Bilateral upper extremity supported Standing balance-Leahy Scale: Poor Standing balance comment: benefits from UE support of DME                            Cognition Arousal/Alertness: Awake/alert Behavior During Therapy: WFL for tasks assessed/performed Overall Cognitive Status: Within Functional Limits for tasks assessed                                          Exercises Other Exercises Other Exercises: sit-stand from EOB at various heights and with various UE support. x15 through session Other Exercises: standing marches 2 x 10 each leg Other Exercises: seated heel raises 2 x 13    General Comments General comments (skin integrity, edema, etc.): VSS on 3L O2, dropped to 88%  with good plethx3  with standing exercises      Pertinent Vitals/Pain Pain Assessment: No/denies pain Pain Intervention(s): Monitored during session     PT Goals (current goals can now be found in the care plan section) Acute Rehab PT Goals Patient Stated Goal: return home PT Goal Formulation: With patient Time For Goal Achievement: 06/26/21 Potential to Achieve Goals: Good Progress towards PT goals: Progressing toward  goals    Frequency    Min 3X/week      PT Plan Current plan remains appropriate       AM-PAC PT "6 Clicks" Mobility   Outcome Measure  Help needed turning from your back to your side while in a flat bed without using bedrails?: A Little Help needed moving from lying on your back to sitting on the side of a flat bed without using bedrails?: A Little Help needed moving to and from a bed to a chair (including a wheelchair)?: A Little Help needed standing up from a chair using your arms (e.g., wheelchair or bedside chair)?: A Little Help needed to walk in hospital room?: A Little Help needed climbing 3-5 steps with a railing? : A Little 6 Click Score: 18    End of Session Equipment Utilized During Treatment: Oxygen Activity Tolerance: Patient limited by fatigue Patient left: in chair;with call bell/phone within reach Nurse Communication: Mobility status PT Visit Diagnosis: Other abnormalities of gait and mobility (R26.89);Muscle weakness (generalized) (M62.81);Difficulty in walking, not elsewhere classified (R26.2)     Time: 6378-5885 PT Time Calculation (min) (ACUTE ONLY): 29 min  Charges:  $Therapeutic Exercise: 23-37 mins                     West Carbo, PT, DPT   Acute Rehabilitation Department Pager #: [phone]   Sandra Cockayne 06/20/2021, 2:48 PM

## 2021-06-20 NOTE — Procedures (Signed)
Electrical Cardioversion Procedure Note [name] [name] [id number] 03-17-1946  Procedure: Electrical Cardioversion Indications:  Atrial Fibrillation  Procedure Details Consent: Risks of procedure as well as the alternatives and risks of each were explained to the (patient/caregiver).  Consent for procedure obtained. Time Out: Verified patient identification, verified procedure, site/side was marked, verified correct patient position, special equipment/implants available, medications/allergies/relevent history reviewed, required imaging and test results available.  Performed  Patient placed on cardiac monitor, pulse oximetry, supplemental oxygen as necessary.  Sedation given:  Propofol per anesthesiology Pacer pads placed anterior and posterior chest.  Cardioverted 1 time(s).  Cardioverted at [place].  Evaluation Findings: Post procedure EKG shows: NSR Complications: None Patient did tolerate procedure well.   [name] [name] 06/20/2021, 4:46 PM

## 2021-06-20 NOTE — Progress Notes (Signed)
CBG 68

## 2021-06-20 NOTE — Interval H&P Note (Signed)
History and Physical Interval Note:  06/20/2021 4:12 PM  [name] [name]  has presented today for surgery, with the diagnosis of afib.  The various methods of treatment have been discussed with the patient and family. After consideration of risks, benefits and other options for treatment, the patient has consented to  Procedure(s): TRANSESOPHAGEAL ECHOCARDIOGRAM (TEE) (N/A) CARDIOVERSION (N/A) as a surgical intervention.  The patient's history has been reviewed, patient examined, no change in status, stable for surgery.  I have reviewed the patient's chart and labs.  Questions were answered to the patient's satisfaction.     Julien Oscar [employer]

## 2021-06-20 NOTE — Transfer of Care (Signed)
Immediate Anesthesia Transfer of Care Note  Patient: [name] [name]  Procedure(s) Performed: TRANSESOPHAGEAL ECHOCARDIOGRAM (TEE) CARDIOVERSION  Patient Location: PACU  Anesthesia Type:General  Level of Consciousness: awake, alert , oriented and patient cooperative  Airway & Oxygen Therapy: Patient Spontanous Breathing and Patient connected to nasal cannula oxygen  Post-op Assessment: Report given to RN and Post -op Vital signs reviewed and stable  Post vital signs: Reviewed and stable  Last Vitals:  Vitals Value Taken Time  BP 111/65 06/20/21 1654  Temp    Pulse 68 06/20/21 1658  Resp 16 06/20/21 1658  SpO2 98 % 06/20/21 1658  Vitals shown include unvalidated device data.  Last Pain:  Vitals:   06/20/21 1459  TempSrc: Temporal  PainSc: 0-No pain      Patients Stated Pain Goal: 0 (41/44/36 0165)  Complications: No notable events documented.

## 2021-06-20 NOTE — Progress Notes (Signed)
  Echocardiogram 2D Echocardiogram has been performed.  Merrie Roof F 06/20/2021, 5:13 PM

## 2021-06-20 NOTE — H&P (View-Only) (Signed)
Patient ID: [name] [name], male   DOB: [birth year], 75 y.o.   MRN: [id number]     Advanced Heart Failure Rounding Note  PCP-Cardiologist: Rozann Lesches, MD   Subjective:    Continues to diurese with lasix gtt at 12 mg/hr. Neg more than 2L yesterday. Down another 4 lb.   Scr continues to trend up, 2.5 > 2.7 > 3.1 > 3.3  K 3.4  BP stable.   Feels okay. No dyspnea at rest. Left knee pain the last couple of days  Remains in AF with rates 40s-50s, several pauses ~ 2 seconds in duration on tele this am   RHC 11/17: RA mean 10 RV 67/10 PA 69/22 PCWP mean 18 Oxygen saturations: PA 63% AO 99% Cardiac Output (Fick) 4.91  Cardiac Index (Fick) 1.94 PVR 4.1 WU PAPI 5.7  Objective:   Weight Range: 131 kg Body mass index is 38.1 kg/m.   Vital Signs:   Temp:  [97.6 F (36.4 C)-98.1 F (36.7 C)] 97.6 F (36.4 C) (11/18 0302) Pulse Rate:  [38-73] 60 (11/18 0302) Resp:  [11-18] 16 (11/18 0302) BP: (102-126)/(54-96) 119/96 (11/18 0302) SpO2:  [92 %-100 %] 96 % (11/18 0302) FiO2 (%):  [40 %] 40 % (11/17 1928) Weight:  [131 kg] 131 kg (11/18 0302) Last BM Date: 06/19/21  Weight change: Filed Weights   06/18/21 0511 06/19/21 0421 06/20/21 0302  Weight: 135.5 kg 132.6 kg 131 kg    Intake/Output:   Intake/Output Summary (Last 24 hours) at 06/20/2021 0723 Last data filed at 06/20/2021 0611 Gross per 24 hour  Intake 993.28 ml  Output 3150 ml  Net -2156.72 ml      Physical Exam    General:  Sitting up in chair. No distress.  HEENT: normal Neck: supple. JVP difficult to assess. Carotids 2+ bilat; no bruits. No lymphadenopathy or thryomegaly appreciated. Cor: PMI nondisplaced. Regular rate & rhythm. No rubs, gallops or murmurs. Lungs: diminished. O2 sats stable on 3L O2 [place] Abdomen: soft, nontender, nondistended.  Extremities: no cyanosis, clubbing, rash, 2+ b/l lower extremity edema. UNNA boots on Neuro: alert & orientedx3, cranial nerves grossly intact. moves all 4  extremities w/o difficulty. Affect pleasant    Telemetry   AF 40s-50s, several pauses > 2 seconds on telemetry this am (personally reviewed)  Labs    CBC Recent Labs    06/19/21 1238  HGB 14.3  14.3  HCT 42.0  47.8   Basic Metabolic Panel Recent Labs    06/19/21 0326 06/19/21 1238 06/20/21 0101  NA 136 139  139 138  K 3.3* 3.4*  3.5 3.4*  CL 95*  --  96*  CO2 31  --  32  GLUCOSE 90  --  118*  BUN 89*  --  102*  CREATININE 3.07*  --  3.32*  CALCIUM 9.0  --  9.2   Liver Function Tests No results for input(s): AST, ALT, ALKPHOS, BILITOT, PROT, ALBUMIN in the last 72 hours. No results for input(s): LIPASE, AMYLASE in the last 72 hours. Cardiac Enzymes No results for input(s): CKTOTAL, CKMB, CKMBINDEX, TROPONINI in the last 72 hours.  BNP: BNP (last 3 results) Recent Labs    01/30/21 0312 06/04/21 1820 06/11/21 1338  BNP 267.0* 446.0* 199.1*    ProBNP (last 3 results) No results for input(s): PROBNP in the last 8760 hours.   D-Dimer No results for input(s): DDIMER in the last 72 hours. Hemoglobin A1C No results for input(s): HGBA1C in the last 72 hours. Fasting  Lipid Panel No results for input(s): CHOL, HDL, LDLCALC, TRIG, CHOLHDL, LDLDIRECT in the last 72 hours. Thyroid Function Tests No results for input(s): TSH, T4TOTAL, T3FREE, THYROIDAB in the last 72 hours.  Invalid input(s): FREET3  Other results:   Imaging    CARDIAC CATHETERIZATION  Result Date: 06/19/2021 1. Right and left heart filling pressures are mildly elevated. 2. Moderate-severe PAH with PVR 4 WU. 3. Low cardiac index, 1.94. Will continue diuresis with IV Lasix infusion today but hold off on metolazone with rising creatinine and filling pressures not markedly high. PH is likely from OHS/OSA, but will arrange for V/Q scan to rule out chronic PE.   ECHOCARDIOGRAM COMPLETE  Result Date: 06/19/2021    ECHOCARDIOGRAM REPORT   Patient Name:   [name] [name] Date of Exam: 06/19/2021  Medical Rec #:  [id number]     Height:       73.0 in Accession #:    [id number]    Weight:       292.3 lb Date of Birth:  May 09, 1946     BSA:          2.528 m Patient Age:    82 years      BP:           102/67 mmHg Patient Gender: M             HR:           47 bpm. Exam Location:  Inpatient Procedure: 2D Echo, Cardiac Doppler, Color Doppler and Intracardiac            Opacification Agent Indications:    CHF-Acute Diastolic J62.83  History:        Patient has prior history of Echocardiogram examinations, most                 recent 12/28/2020. CAD and Previous Myocardial Infarction,                 Arrythmias:LBBB and Atrial Fibrillation; Risk                 Factors:Dyslipidemia, Hypertension, Sleep Apnea and Diabetes.                 Thyroid disease.  Sonographer:    Darlina Sicilian RDCS Referring Phys: [place]  1. Left ventricular ejection fraction, by estimation, is 45 to 50%. The left ventricle has mildly decreased function. The left ventricle demonstrates global hypokinesis. The left ventricular internal cavity size was severely dilated. Left ventricular diastolic function could not be evaluated.  2. Right ventricular systolic function is moderately reduced. The right ventricular size is not well visualized. There is moderately elevated pulmonary artery systolic pressure.  3. Left atrial size was moderately dilated.  4. The mitral valve is grossly normal. Trivial mitral valve regurgitation. No evidence of mitral stenosis.  5. The aortic valve is grossly normal. There is mild calcification of the aortic valve. Aortic valve regurgitation is trivial. Aortic valve sclerosis is present, with no evidence of aortic valve stenosis.  6. Aortic dilatation noted. There is borderline dilatation of the ascending aorta, measuring 39 mm.  7. The inferior vena cava is dilated in size with <50% respiratory variability, suggesting right atrial pressure of 15 mmHg. Comparison(s): Changes from prior study  are noted. EF slightly decreased (globally) compared to prior. Conclusion(s)/Recommendation(s): Significant bradycardia, down to 40 bpm, noted during study. FINDINGS  Left Ventricle: Left ventricular ejection fraction, by estimation, is 45 to 50%. The  left ventricle has mildly decreased function. The left ventricle demonstrates global hypokinesis. Definity contrast agent was given IV to delineate the left ventricular  endocardial borders. The left ventricular internal cavity size was severely dilated. There is no left ventricular hypertrophy. Left ventricular diastolic function could not be evaluated due to atrial fibrillation. Left ventricular diastolic function could not be evaluated. Right Ventricle: The right ventricular size is not well visualized. Right vetricular wall thickness was not well visualized. Right ventricular systolic function is moderately reduced. There is moderately elevated pulmonary artery systolic pressure. The tricuspid regurgitant velocity is 3.07 m/s, and with an assumed right atrial pressure of 15 mmHg, the estimated right ventricular systolic pressure is 33.8 mmHg. Left Atrium: Left atrial size was moderately dilated. Right Atrium: Right atrial size was not well visualized. Pericardium: There is no evidence of pericardial effusion. Mitral Valve: The mitral valve is grossly normal. Trivial mitral valve regurgitation. No evidence of mitral valve stenosis. Tricuspid Valve: The tricuspid valve is grossly normal. Tricuspid valve regurgitation is trivial. No evidence of tricuspid stenosis. Aortic Valve: The aortic valve is grossly normal. There is mild calcification of the aortic valve. Aortic valve regurgitation is trivial. Aortic valve sclerosis is present, with no evidence of aortic valve stenosis. Pulmonic Valve: The pulmonic valve was not well visualized. Pulmonic valve regurgitation is trivial. No evidence of pulmonic stenosis. Aorta: Aortic dilatation noted. There is borderline  dilatation of the ascending aorta, measuring 39 mm. Venous: The inferior vena cava is dilated in size with less than 50% respiratory variability, suggesting right atrial pressure of 15 mmHg. IAS/Shunts: The interatrial septum was not well visualized.  LEFT VENTRICLE PLAX 2D LVIDd:         6.40 cm LVIDs:         4.90 cm LV PW:         1.00 cm LV IVS:        1.10 cm LVOT diam:     2.30 cm LV SV:         99 LV SV Index:   39 LVOT Area:     4.15 cm  LV Volumes (MOD) LV vol d, MOD A2C: 162.0 ml LV vol d, MOD A4C: 183.0 ml LV vol s, MOD A2C: 95.1 ml LV vol s, MOD A4C: 84.9 ml LV SV MOD A2C:     66.9 ml LV SV MOD A4C:     183.0 ml LV SV MOD BP:      83.6 ml RIGHT VENTRICLE RV S prime:     6.98 cm/s TAPSE (M-mode): 1.3 cm LEFT ATRIUM              Index LA diam:        4.90 cm  1.94 cm/m LA Vol (A2C):   68.8 ml  27.21 ml/m LA Vol (A4C):   104.0 ml 41.13 ml/m LA Biplane Vol: 90.7 ml  35.87 ml/m  AORTIC VALVE LVOT Vmax:   124.00 cm/s LVOT Vmean:  96.900 cm/s LVOT VTI:    0.238 m  AORTA Ao Root diam: 2.70 cm Ao Asc diam:  3.90 cm MITRAL VALVE               TRICUSPID VALVE MV Area (PHT): 3.61 cm    TR Peak grad:   37.7 mmHg MV Decel Time: 210 msec    TR Vmax:        307.00 cm/s MV E velocity: 92.95 cm/s  SHUNTS                            Systemic VTI:  0.24 m                            Systemic Diam: 2.30 cm Buford Dresser MD Electronically signed by Buford Dresser MD Signature Date/Time: 06/19/2021/4:34:56 PM    Final      Medications:     Scheduled Medications:  amiodarone  400 mg Oral BID   apixaban  5 mg Oral BID   docusate sodium  100 mg Oral BID   insulin aspart  0-15 Units Subcutaneous TID WC   insulin aspart  0-5 Units Subcutaneous QHS   insulin glargine-yfgn  26 Units Subcutaneous Q2200   isosorbide mononitrate  30 mg Oral Daily   levothyroxine  300 mcg Oral QAC breakfast   loratadine  5 mg Oral Daily   mometasone-formoterol  2 puff Inhalation BID    nutrition supplement (JUVEN)  1 packet Oral BID BM   potassium chloride  40 mEq Oral BID   simvastatin  5 mg Oral Daily   sodium chloride flush  3 mL Intravenous Q12H   sodium chloride flush  3 mL Intravenous Q12H    Infusions:  sodium chloride 20 mL/hr at 06/20/21 0656   furosemide (LASIX) 200 mg in dextrose 5% 100 mL (2mg /mL) infusion 12 mg/hr (06/20/21 0658)    PRN Medications: acetaminophen **OR** acetaminophen, albuterol, alum & mag hydroxide-simeth, bisacodyl, hydrALAZINE, loperamide, morphine injection, ondansetron **OR** ondansetron (ZOFRAN) IV, oxyCODONE, polyethylene glycol, sodium chloride, zolpidem    Assessment/Plan   1. Acute on chronic diastolic CHF:  -In setting of CKD stage IV and persistent atrial fibrillation.   -Echo in 5/22 with EF 55-60%, mild RV enlargement, normal RV systolic function.  Admitted with marked volume overload.  -Echo this admit with EF 45-50%, LV severely dilated, RV moderately reduced, RVSP 53 mmHg -RHC 11/17 - RA mean, PCWP mean 18, moderate to severe PAH with PVR 4 WU, CI low at 1.94 -Diuresing with lasix gtt at 12 mg/hr. Weight down another 4 lb. Scr continues to trend up. BUN now above 100. Stop furosemide gtt and plan to transition to oral diuretic 60 mg Torsemide BID (on 40 mg BID prior to admit) tomorrow -Continue Unna boots. -Would try to get him back into NSR. Scheduled for TEE/DCCV this afternoon  -May be able to eventually start Jardiance, follow GFR.  2. Pulmonary hypertension: -Moderate to severe PAH with PVR 4 WU on RHC 06/19/21 -Likely due to OHS/OSA -V/Q scan to r/o chronic PE 2. Atrial fibrillation: Persistent since 12/21, DCCV never attempted.  Suspect he will not stay in NSR without anti-arrhythmic.  I would like to see if regaining NSR would help with CHF management.  - Bisoprolol stopped with bradycardia.  - Continues on amiodarone 400 mg bid. Decrease to 400 mg daily. Several pauses > 2 seconds on telemetry this am.  Ventricular rate averaging 40s-50s - TEE/DCCV scheduled for this afternoon. - Continue apixaban 5 mg bid.  3. CAD: S/p NSTEMI with occluded OM1 in 6/18, had DES to 80% stenosis mLAD at that time.  No further chest pain.  - No coronary angiography in absence of ACS given CKD stage 4.  - Continue statin.  - No ASA given Eliquis use.  4. CKD: Stage IV.  - Scr continuing to trend up, 3.3 today.  See above regarding diuretics.  - RHC yesterday with mildly elevated R and L heart filling pressures 5. Type 2 DM: Continue insulin.  6. Chronic hypoxemic respiratory failure: He is on 2L home oxygen, O2 sats stable on 3L here.  ?OHS/OSA, ?COPD.  He smoked in the past but not heavily.  - Would get PFTs after diuresis.  - Needs home sleep study.  7. Hypokalemia: - K 3.4 - Supp K - Check mag level 8. Left lower extremity pain: - Getting CT per TRH - DVT less likely given anticoagulation - Symptoms not clearly same as prior gout pain. Will check uric acid level.   Length of Stay: 8  FINCH, LINDSAY N, PA-C  06/20/2021, 7:23 AM  Advanced Heart Failure Team Pager [phone] (M-F; 7a - 5p)  Please contact [facility] for night-coverage after hours (5p -7a ) and weekends on [email or web address]   Patient seen with PA, agree with the above note.   RHC yesterday, filling pressures still mildly increased.  Lasix gtt continued overnight, weight down another 4 lbs.  Still with significant edema but BUN/creatinine up again 102/3.32.   Breathing overall better.   Still in atrial fibrillation with rate 50s on amiodarone + apixaban.   General: NAD Neck: Thick, JVP difficult, no thyromegaly or thyroid nodule.  Lungs: Clear to auscultation bilaterally with normal respiratory effort. CV: Nondisplaced PMI.  Heart irregular S1/S2, no S3/S4, no murmur.  1+ chronic edema to thighs.  Abdomen: Soft, nontender, no hepatosplenomegaly, no distention.  Skin: Intact without lesions or rashes.  Neurologic: Alert and oriented  x 3.  Psych: Normal affect. Extremities: No clubbing or cyanosis.  HEENT: Normal.   I think we have diuresed him as much as we can acutely, BUN/creatinine now rising.  - Stop Lasix gtt.  - Start torsemide 60 mg bid for home when renal indices stabilize.   I will give him 1 attempt to regain NSR, plan TEE-DCCV later today. He has been in AF since 12/21 with no attempt at DCCV.  We discussed risks/benefits and he agrees to the procedure.  If DCCV fails, will stop amiodarone.   [name] 06/20/2021 12:23 PM

## 2021-06-20 NOTE — Plan of Care (Signed)

## 2021-06-20 NOTE — Progress Notes (Addendum)
Rechecked CBG: 96

## 2021-06-20 NOTE — Progress Notes (Addendum)
Patient ID: [name] [name], male   DOB: Jul 22, 1946, 75 y.o.   MRN: [id number]     Advanced Heart Failure Rounding Note  PCP-Cardiologist: Rozann Lesches, MD   Subjective:    Continues to diurese with lasix gtt at 12 mg/hr. Neg more than 2L yesterday. Down another 4 lb.   Scr continues to trend up, 2.5 > 2.7 > 3.1 > 3.3  K 3.4  BP stable.   Feels okay. No dyspnea at rest. Left knee pain the last couple of days  Remains in AF with rates 40s-50s, several pauses ~ 2 seconds in duration on tele this am   RHC 11/17: RA mean 10 RV 67/10 PA 69/22 PCWP mean 18 Oxygen saturations: PA 63% AO 99% Cardiac Output (Fick) 4.91  Cardiac Index (Fick) 1.94 PVR 4.1 WU PAPI 5.7  Objective:   Weight Range: 131 kg Body mass index is 38.1 kg/m.   Vital Signs:   Temp:  [97.6 F (36.4 C)-98.1 F (36.7 C)] 97.6 F (36.4 C) (11/18 0302) Pulse Rate:  [38-73] 60 (11/18 0302) Resp:  [11-18] 16 (11/18 0302) BP: (102-126)/(54-96) 119/96 (11/18 0302) SpO2:  [92 %-100 %] 96 % (11/18 0302) FiO2 (%):  [40 %] 40 % (11/17 1928) Weight:  [131 kg] 131 kg (11/18 0302) Last BM Date: 06/19/21  Weight change: Filed Weights   06/18/21 0511 06/19/21 0421 06/20/21 0302  Weight: 135.5 kg 132.6 kg 131 kg    Intake/Output:   Intake/Output Summary (Last 24 hours) at 06/20/2021 0723 Last data filed at 06/20/2021 0611 Gross per 24 hour  Intake 993.28 ml  Output 3150 ml  Net -2156.72 ml      Physical Exam    General:  Sitting up in chair. No distress.  HEENT: normal Neck: supple. JVP difficult to assess. Carotids 2+ bilat; no bruits. No lymphadenopathy or thryomegaly appreciated. Cor: PMI nondisplaced. Regular rate & rhythm. No rubs, gallops or murmurs. Lungs: diminished. O2 sats stable on 3L O2  Abdomen: soft, nontender, nondistended.  Extremities: no cyanosis, clubbing, rash, 2+ b/l lower extremity edema. UNNA boots on Neuro: alert & orientedx3, cranial nerves grossly intact. moves all 4  extremities w/o difficulty. Affect pleasant    Telemetry   AF 40s-50s, several pauses > 2 seconds on telemetry this am (personally reviewed)  Labs    CBC Recent Labs    06/19/21 1238  HGB 14.3  14.3  HCT 42.0  16.0   Basic Metabolic Panel Recent Labs    06/19/21 0326 06/19/21 1238 06/20/21 0101  NA 136 139  139 138  K 3.3* 3.4*  3.5 3.4*  CL 95*  --  96*  CO2 31  --  32  GLUCOSE 90  --  118*  BUN 89*  --  102*  CREATININE 3.07*  --  3.32*  CALCIUM 9.0  --  9.2   Liver Function Tests No results for input(s): AST, ALT, ALKPHOS, BILITOT, PROT, ALBUMIN in the last 72 hours. No results for input(s): LIPASE, AMYLASE in the last 72 hours. Cardiac Enzymes No results for input(s): CKTOTAL, CKMB, CKMBINDEX, TROPONINI in the last 72 hours.  BNP: BNP (last 3 results) Recent Labs    01/30/21 0312 06/04/21 1820 06/11/21 1338  BNP 267.0* 446.0* 199.1*    ProBNP (last 3 results) No results for input(s): PROBNP in the last 8760 hours.   D-Dimer No results for input(s): DDIMER in the last 72 hours. Hemoglobin A1C No results for input(s): HGBA1C in the last 72 hours. Fasting  Lipid Panel No results for input(s): CHOL, HDL, LDLCALC, TRIG, CHOLHDL, LDLDIRECT in the last 72 hours. Thyroid Function Tests No results for input(s): TSH, T4TOTAL, T3FREE, THYROIDAB in the last 72 hours.  Invalid input(s): FREET3  Other results:   Imaging    CARDIAC CATHETERIZATION  Result Date: 06/19/2021 1. Right and left heart filling pressures are mildly elevated. 2. Moderate-severe PAH with PVR 4 WU. 3. Low cardiac index, 1.94. Will continue diuresis with IV Lasix infusion today but hold off on metolazone with rising creatinine and filling pressures not markedly high. PH is likely from OHS/OSA, but will arrange for V/Q scan to rule out chronic PE.   ECHOCARDIOGRAM COMPLETE  Result Date: 06/19/2021    ECHOCARDIOGRAM REPORT   Patient Name:   [name] [name] Date of Exam: 06/19/2021  Medical Rec #:  [id number]     Height:       73.0 in Accession #:    [id number]    Weight:       292.3 lb Date of Birth:  04/03/1946     BSA:          2.528 m Patient Age:    78 years      BP:           102/67 mmHg Patient Gender: M             HR:           47 bpm. Exam Location:  Inpatient Procedure: 2D Echo, Cardiac Doppler, Color Doppler and Intracardiac            Opacification Agent Indications:    CHF-Acute Diastolic G62.69  History:        Patient has prior history of Echocardiogram examinations, most                 recent 12/28/2020. CAD and Previous Myocardial Infarction,                 Arrythmias:LBBB and Atrial Fibrillation; Risk                 Factors:Dyslipidemia, Hypertension, Sleep Apnea and Diabetes.                 Thyroid disease.  Sonographer:    Darlina Sicilian RDCS Referring Phys: [place]  1. Left ventricular ejection fraction, by estimation, is 45 to 50%. The left ventricle has mildly decreased function. The left ventricle demonstrates global hypokinesis. The left ventricular internal cavity size was severely dilated. Left ventricular diastolic function could not be evaluated.  2. Right ventricular systolic function is moderately reduced. The right ventricular size is not well visualized. There is moderately elevated pulmonary artery systolic pressure.  3. Left atrial size was moderately dilated.  4. The mitral valve is grossly normal. Trivial mitral valve regurgitation. No evidence of mitral stenosis.  5. The aortic valve is grossly normal. There is mild calcification of the aortic valve. Aortic valve regurgitation is trivial. Aortic valve sclerosis is present, with no evidence of aortic valve stenosis.  6. Aortic dilatation noted. There is borderline dilatation of the ascending aorta, measuring 39 mm.  7. The inferior vena cava is dilated in size with <50% respiratory variability, suggesting right atrial pressure of 15 mmHg. Comparison(s): Changes from prior study  are noted. EF slightly decreased (globally) compared to prior. Conclusion(s)/Recommendation(s): Significant bradycardia, down to 40 bpm, noted during study. FINDINGS  Left Ventricle: Left ventricular ejection fraction, by estimation, is 45 to 50%. The  left ventricle has mildly decreased function. The left ventricle demonstrates global hypokinesis. Definity contrast agent was given IV to delineate the left ventricular  endocardial borders. The left ventricular internal cavity size was severely dilated. There is no left ventricular hypertrophy. Left ventricular diastolic function could not be evaluated due to atrial fibrillation. Left ventricular diastolic function could not be evaluated. Right Ventricle: The right ventricular size is not well visualized. Right vetricular wall thickness was not well visualized. Right ventricular systolic function is moderately reduced. There is moderately elevated pulmonary artery systolic pressure. The tricuspid regurgitant velocity is 3.07 m/s, and with an assumed right atrial pressure of 15 mmHg, the estimated right ventricular systolic pressure is 16.0 mmHg. Left Atrium: Left atrial size was moderately dilated. Right Atrium: Right atrial size was not well visualized. Pericardium: There is no evidence of pericardial effusion. Mitral Valve: The mitral valve is grossly normal. Trivial mitral valve regurgitation. No evidence of mitral valve stenosis. Tricuspid Valve: The tricuspid valve is grossly normal. Tricuspid valve regurgitation is trivial. No evidence of tricuspid stenosis. Aortic Valve: The aortic valve is grossly normal. There is mild calcification of the aortic valve. Aortic valve regurgitation is trivial. Aortic valve sclerosis is present, with no evidence of aortic valve stenosis. Pulmonic Valve: The pulmonic valve was not well visualized. Pulmonic valve regurgitation is trivial. No evidence of pulmonic stenosis. Aorta: Aortic dilatation noted. There is borderline  dilatation of the ascending aorta, measuring 39 mm. Venous: The inferior vena cava is dilated in size with less than 50% respiratory variability, suggesting right atrial pressure of 15 mmHg. IAS/Shunts: The interatrial septum was not well visualized.  LEFT VENTRICLE PLAX 2D LVIDd:         6.40 cm LVIDs:         4.90 cm LV PW:         1.00 cm LV IVS:        1.10 cm LVOT diam:     2.30 cm LV SV:         99 LV SV Index:   39 LVOT Area:     4.15 cm  LV Volumes (MOD) LV vol d, MOD A2C: 162.0 ml LV vol d, MOD A4C: 183.0 ml LV vol s, MOD A2C: 95.1 ml LV vol s, MOD A4C: 84.9 ml LV SV MOD A2C:     66.9 ml LV SV MOD A4C:     183.0 ml LV SV MOD BP:      83.6 ml RIGHT VENTRICLE RV S prime:     6.98 cm/s TAPSE (M-mode): 1.3 cm LEFT ATRIUM              Index LA diam:        4.90 cm  1.94 cm/m LA Vol (A2C):   68.8 ml  27.21 ml/m LA Vol (A4C):   104.0 ml 41.13 ml/m LA Biplane Vol: 90.7 ml  35.87 ml/m  AORTIC VALVE LVOT Vmax:   124.00 cm/s LVOT Vmean:  96.900 cm/s LVOT VTI:    0.238 m  AORTA Ao Root diam: 2.70 cm Ao Asc diam:  3.90 cm MITRAL VALVE               TRICUSPID VALVE MV Area (PHT): 3.61 cm    TR Peak grad:   37.7 mmHg MV Decel Time: 210 msec    TR Vmax:        307.00 cm/s MV E velocity: 92.95 cm/s  SHUNTS                            Systemic VTI:  0.24 m                            Systemic Diam: 2.30 cm Buford Dresser MD Electronically signed by Buford Dresser MD Signature Date/Time: 06/19/2021/4:34:56 PM    Final      Medications:     Scheduled Medications:  amiodarone  400 mg Oral BID   apixaban  5 mg Oral BID   docusate sodium  100 mg Oral BID   insulin aspart  0-15 Units Subcutaneous TID WC   insulin aspart  0-5 Units Subcutaneous QHS   insulin glargine-yfgn  26 Units Subcutaneous Q2200   isosorbide mononitrate  30 mg Oral Daily   levothyroxine  300 mcg Oral QAC breakfast   loratadine  5 mg Oral Daily   mometasone-formoterol  2 puff Inhalation BID    nutrition supplement (JUVEN)  1 packet Oral BID BM   potassium chloride  40 mEq Oral BID   simvastatin  5 mg Oral Daily   sodium chloride flush  3 mL Intravenous Q12H   sodium chloride flush  3 mL Intravenous Q12H    Infusions:  sodium chloride 20 mL/hr at 06/20/21 0656   furosemide (LASIX) 200 mg in dextrose 5% 100 mL (2mg /mL) infusion 12 mg/hr (06/20/21 0658)    PRN Medications: acetaminophen **OR** acetaminophen, albuterol, alum & mag hydroxide-simeth, bisacodyl, hydrALAZINE, loperamide, morphine injection, ondansetron **OR** ondansetron (ZOFRAN) IV, oxyCODONE, polyethylene glycol, sodium chloride, zolpidem    Assessment/Plan   1. Acute on chronic diastolic CHF:  -In setting of CKD stage IV and persistent atrial fibrillation.   -Echo in 5/22 with EF 55-60%, mild RV enlargement, normal RV systolic function.  Admitted with marked volume overload.  -Echo this admit with EF 45-50%, LV severely dilated, RV moderately reduced, RVSP 53 mmHg -RHC 11/17 - RA mean, PCWP mean 18, moderate to severe PAH with PVR 4 WU, CI low at 1.94 -Diuresing with lasix gtt at 12 mg/hr. Weight down another 4 lb. Scr continues to trend up. BUN now above 100. Stop furosemide gtt and plan to transition to oral diuretic 60 mg Torsemide BID (on 40 mg BID prior to admit) tomorrow -Continue Unna boots. -Would try to get him back into NSR. Scheduled for TEE/DCCV this afternoon  -May be able to eventually start Jardiance, follow GFR.  2. Pulmonary hypertension: -Moderate to severe PAH with PVR 4 WU on RHC 06/19/21 -Likely due to OHS/OSA -V/Q scan to r/o chronic PE 2. Atrial fibrillation: Persistent since 12/21, DCCV never attempted.  Suspect he will not stay in NSR without anti-arrhythmic.  I would like to see if regaining NSR would help with CHF management.  - Bisoprolol stopped with bradycardia.  - Continues on amiodarone 400 mg bid. Decrease to 400 mg daily. Several pauses > 2 seconds on telemetry this am.  Ventricular rate averaging 40s-50s - TEE/DCCV scheduled for this afternoon. - Continue apixaban 5 mg bid.  3. CAD: S/p NSTEMI with occluded OM1 in 6/18, had DES to 80% stenosis mLAD at that time.  No further chest pain.  - No coronary angiography in absence of ACS given CKD stage 4.  - Continue statin.  - No ASA given Eliquis use.  4. CKD: Stage IV.  - Scr continuing to trend up, 3.3 today.  See above regarding diuretics.  - RHC yesterday with mildly elevated R and L heart filling pressures 5. Type 2 DM: Continue insulin.  6. Chronic hypoxemic respiratory failure: He is on 2L home oxygen, O2 sats stable on 3L here.  ?OHS/OSA, ?COPD.  He smoked in the past but not heavily.  - Would get PFTs after diuresis.  - Needs home sleep study.  7. Hypokalemia: - K 3.4 - Supp K - Check mag level 8. Left lower extremity pain: - Getting CT per TRH - DVT less likely given anticoagulation - Symptoms not clearly same as prior gout pain. Will check uric acid level.   Length of Stay: 8  FINCH, LINDSAY N, PA-C  06/20/2021, 7:23 AM  Advanced Heart Failure Team Pager [phone] (M-F; 7a - 5p)  Please contact [facility] for night-coverage after hours (5p -7a ) and weekends on [email or web address]   Patient seen with PA, agree with the above note.   RHC yesterday, filling pressures still mildly increased.  Lasix gtt continued overnight, weight down another 4 lbs.  Still with significant edema but BUN/creatinine up again 102/3.32.   Breathing overall better.   Still in atrial fibrillation with rate 50s on amiodarone + apixaban.   General: NAD Neck: Thick, JVP difficult, no thyromegaly or thyroid nodule.  Lungs: Clear to auscultation bilaterally with normal respiratory effort. CV: Nondisplaced PMI.  Heart irregular S1/S2, no S3/S4, no murmur.  1+ chronic edema to thighs.  Abdomen: Soft, nontender, no hepatosplenomegaly, no distention.  Skin: Intact without lesions or rashes.  Neurologic: Alert and oriented  x 3.  Psych: Normal affect. Extremities: No clubbing or cyanosis.  HEENT: Normal.   I think we have diuresed him as much as we can acutely, BUN/creatinine now rising.  - Stop Lasix gtt.  - Start torsemide 60 mg bid for home when renal indices stabilize.   I will give him 1 attempt to regain NSR, plan TEE-DCCV later today. He has been in AF since 12/21 with no attempt at DCCV.  We discussed risks/benefits and he agrees to the procedure.  If DCCV fails, will stop amiodarone.   [name] 06/20/2021 12:23 PM

## 2021-06-20 NOTE — CV Procedure (Signed)
Procedure:  TEE  Sedation: Per anesthesiology  Indication: Atrial fibrillation  Findings: Please see echo section for full report.   Normal LV size with mild LV hypertrophy and EF 45-50%, diffuse hypokinesis.  Mildly dilated RV with mild RV systolic dysfunction.  Moderate LAE, no LAA thrombus.  Moderate RA dilation.  No ASD/PFO by color doppler.  Trivial TR, no complete jet to measure PASP.  Trivial MR. Trileaflet aortic valve with mild aortic insufficiency, no stenosis.  Normal caliber thoracic aorta with mild plaque.   No LA appendage thrombus, may proceed to DCCV.   [name] [name] 06/20/2021 4:46 PM

## 2021-06-21 DIAGNOSIS — I5033 Acute on chronic diastolic (congestive) heart failure: Secondary | ICD-10-CM | POA: Diagnosis not present

## 2021-06-21 LAB — BASIC METABOLIC PANEL
Anion gap: 13 (ref 5–15)
BUN: 102 mg/dL — ABNORMAL HIGH (ref 8–23)
CO2: 31 mmol/L (ref 22–32)
Calcium: 9 mg/dL (ref 8.9–10.3)
Chloride: 94 mmol/L — ABNORMAL LOW (ref 98–111)
Creatinine, Ser: 3.29 mg/dL — ABNORMAL HIGH (ref 0.61–1.24)
GFR, Estimated: 19 mL/min — ABNORMAL LOW (ref >60)
Glucose, Bld: 108 mg/dL — ABNORMAL HIGH (ref 70–99)
Potassium: 3.6 mmol/L (ref 3.5–5.1)
Sodium: 138 mmol/L (ref 135–145)

## 2021-06-21 LAB — GLUCOSE, CAPILLARY
Glucose-Capillary: 174 mg/dL — ABNORMAL HIGH (ref 70–99)
Glucose-Capillary: 113 mg/dL — ABNORMAL HIGH (ref 70–99)
Glucose-Capillary: 95 mg/dL (ref 70–99)
Glucose-Capillary: 119 mg/dL — ABNORMAL HIGH (ref 70–99)
Glucose-Capillary: 216 mg/dL — ABNORMAL HIGH (ref 70–99)

## 2021-06-21 MED ORDER — PREDNISONE 20 MG PO TABS
40.0000 mg | ORAL_TABLET | Freq: Every day | ORAL | Status: AC
  Administered 2021-06-21 – 2021-06-22 (×2): 40 mg via ORAL
  Filled 2021-06-21 (×3): qty 2

## 2021-06-21 MED ORDER — CEPHALEXIN 500 MG PO CAPS
500.0000 mg | ORAL_CAPSULE | Freq: Two times a day (BID) | ORAL | Status: AC
  Administered 2021-06-21 – 2021-06-25 (×10): 500 mg via ORAL
  Filled 2021-06-21 (×10): qty 1

## 2021-06-21 MED ORDER — TORSEMIDE 20 MG PO TABS
60.0000 mg | ORAL_TABLET | Freq: Two times a day (BID) | ORAL | Status: DC
  Administered 2021-06-21 – 2021-06-22 (×3): 60 mg via ORAL
  Filled 2021-06-21 (×3): qty 3

## 2021-06-21 NOTE — Progress Notes (Signed)
[facility]                                                                              Patient Demographics  [name] [name], is a 75 y.o. male, DOB - May 22, 1946, DXI:[id number]  Admit date - 06/11/2021   Admitting Physician Darliss Cheney, MD  Outpatient Primary MD for the patient is Nickola Major, MD  Outpatient specialists:   LOS - 9  days   Medical records reviewed and are as summarized below:    Chief Complaint  Patient presents with   Shortness of Breath       Brief summary   Patient is a 75 year old male with A. fib, CAD status post stent, CKD stage III, HTN, HLD, COPD, hypothyroidism, morbid obesity, NHL treated with XRT and chemo, DM, OSA presented with shortness of breath.  Currently on O2 3 to 4 L via was feeling shortness of breath.  Patient PND, unchanged from prior, sleeps in a recliner.  Patient's weight was up about 9 lbs on admission.  Patient admits to 3 doses a week of medications.   In ED, he was found to be acute on chronic hypoxic respiratory failure, desatted to 60s on 4 L., was placed on NRB and quickly weaned down to 4 L.  Patient was admitted with acute on chronic diastolic CHF Initially placed on IV Lasix with minimal urine output.  CHF team consulted, subsequently placed on IV Lasix GTT, underwent TEE/DCCV on 11/18   Assessment & Plan    Principal Problem: Acute on chronic respiratory failure with hypoxia secondary to acute on chronic diastolic CHF (congestive heart failure) (HCC) -Outpatient on 3 to 4 L of O2, currently on 4 L. -Minimal output with IV Lasix bolus doses and improved with Lasix infusion and metolazone.  CHF team following. -Negative balance of 12.1 L, weight down from 304 lbs on admission to 286 currently -IV Lasix infusion placed on hold due to rising creatinine, now transition to oral torsemide 60 mg twice daily - wound care consult: venous stasis with edema, erythema, added Keflex  Active Problems:   Chronic  kidney disease Stage IV -Baseline creatinine 2.6-2.8, plateaued at 3.3, IV Lasix infusion placed on hold - Creatinine now improving, 3.2    Morbid obesity ([place]), lower extremity edema, venous stasis/cellulitis L LE, ?  Acute gout -CT left lower extremity with diffuse circumferential soft tissue swelling of left lower leg, nonspecific but can be seen in cellulitis, no abscess -  wound care consult: venous stasis with edema, erythema, tenderness, added Keflex -Uric acid 13.1, added prednisone 40 mg daily for 3 days  Atrial fibrillation, persistent -Underwent TEE/DCCV on 11/18 -Beta-blocker stopped due to bradycardia, placed on amiodarone for daily -Continue anticoagulation with Eliquis   Moderate to severe pulmonary hypertension -Moderately severe PAH likely due to COPD, OHS/OSA -VQ scan 11/18 negative for any PE  COPD with possible acute exacerbation -Currently no wheezing, stable, patient had completed prednisone  Possible OSA -  Need sleep study as outpatient, while inpatient  HTN -BP controlled, continue torsemide, Imdur  Diabetes mellitus type 2, IDDM uncontrolled with hyperglycemia, CKD stage IV -CBGs well-controlled, continue  sliding scale insulin -A1c 5.8 in 01/2021 Recent Labs    06/20/21 1530 06/20/21 1555 06/20/21 1657 06/20/21 2108 06/21/21 0610 06/21/21 1131  GLUCAP 68* 96 89 174* 113* 95      Hypothyroidism -Continue Synthroid 200 MCG daily  Obesity Estimated body mass index is 37.81 kg/m as calculated from the following:   Height as of this encounter: 6\' 1"  (1.854 m).   Weight as of this encounter: 130 kg. Appears to be deconditioned, PT on 11/18 recommended home health PT  Code Status: Full CODE STATUS DVT Prophylaxis:  Place TED hose Start: 06/14/21 0958 apixaban (ELIQUIS) tablet 5 mg   Level of Care: Level of care: Telemetry Cardiac Family Communication: Discussed all imaging results, lab results, explained to the patient    Disposition  Plan:     Status is: Inpatient  Remains inpatient appropriate because: Multiple medical issues, volume overload, Cellulitis/gout   Time Spent in minutes   25 minutes  Procedures:  TEE/DCCV  Consultants:   Cardiology  Antimicrobials:   Anti-infectives (From admission, onward)    Start     Dose/Rate Route Frequency Ordered Stop   06/21/21 1245  cephALEXin (KEFLEX) capsule 500 mg        500 mg Oral Every 12 hours 06/21/21 1228 06/26/21 0959          Medications  Scheduled Meds:  amiodarone  400 mg Oral Daily   apixaban  5 mg Oral BID   cephALEXin  500 mg Oral Q12H   docusate sodium  100 mg Oral BID   insulin aspart  0-15 Units Subcutaneous TID WC   insulin aspart  0-5 Units Subcutaneous QHS   insulin glargine-yfgn  26 Units Subcutaneous Q2200   isosorbide mononitrate  30 mg Oral Daily   levothyroxine  300 mcg Oral QAC breakfast   loratadine  5 mg Oral Daily   mometasone-formoterol  2 puff Inhalation BID   nutrition supplement (JUVEN)  1 packet Oral BID BM   simvastatin  5 mg Oral Daily   sodium chloride flush  3 mL Intravenous Q12H   sodium chloride flush  3 mL Intravenous Q12H   torsemide  60 mg Oral BID   Continuous Infusions: PRN Meds:.acetaminophen **OR** acetaminophen, albuterol, alum & mag hydroxide-simeth, bisacodyl, hydrALAZINE, loperamide, morphine injection, ondansetron **OR** ondansetron (ZOFRAN) IV, oxyCODONE, polyethylene glycol, sodium chloride, zolpidem      Subjective:   [name] [name] was seen and examined today.  Sitting up in the recliner chair, patient denies dizziness, chest pain, shortness of breath.  Significant swelling lower extremities erythema and tenderness left leg.  No acute events overnight.    Objective:   Vitals:   06/21/21 0400 06/21/21 0753 06/21/21 0758 06/21/21 1132  BP:   112/61 116/70  Pulse:   (!) 55 60  Resp:   17 18  Temp:   (!) 97.5 F (36.4 C) 98.1 F (36.7 C)  TempSrc:   Oral Axillary  SpO2: 97% 96% 93% 95%   Weight:      Height:        Intake/Output Summary (Last 24 hours) at 06/21/2021 1302 Last data filed at 06/21/2021 1200 Gross per 24 hour  Intake 1863 ml  Output 1725 ml  Net 138 ml     Wt Readings from Last 3 Encounters:  06/21/21 130 kg  06/04/21 (!) 138.5 kg  03/07/21 136.1 kg     Exam General: Alert and oriented x 3, NAD, sitting up in the chair Cardiovascular: S1 S2 auscultated,  RRR Respiratory: Clear to auscultation bilaterally, no wheezing, rales or rhonchi Gastrointestinal: Obese,  soft, nontender, nondistended, + bowel sounds Ext: 2+ pedal edema bilaterally, erythema, tenderness, left leg, dressing+ Neuro: no new deficits Psych: Normal affect and demeanor, alert and oriented x3    Data Reviewed:  I have personally reviewed following labs and imaging studies  Micro Results Recent Results (from the past 240 hour(s))  Resp Panel by RT-PCR (Flu A&B, Covid) Nasopharyngeal Swab     Status: None   Collection Time: 06/11/21  5:29 PM   Specimen: Nasopharyngeal Swab; Nasopharyngeal(NP) swabs in vial transport medium  Result Value Ref Range Status   SARS Coronavirus 2 by RT PCR NEGATIVE NEGATIVE Final    Comment: (NOTE) SARS-CoV-2 target nucleic acids are NOT DETECTED.  The SARS-CoV-2 RNA is generally detectable in upper respiratory specimens during the acute phase of infection. The lowest concentration of SARS-CoV-2 viral copies this assay can detect is 138 copies/mL. A negative result does not preclude SARS-Cov-2 infection and should not be used as the sole basis for treatment or other patient management decisions. A negative result may occur with  improper specimen collection/handling, submission of specimen other than nasopharyngeal swab, presence of viral mutation(s) within the areas targeted by this assay, and inadequate number of viral copies(<138 copies/mL). A negative result must be combined with clinical observations, patient history, and  epidemiological information. The expected result is Negative.  Fact Sheet for Patients:  EntrepreneurPulse.com.au  Fact Sheet for Healthcare Providers:  IncredibleEmployment.be  This test is no t yet approved or cleared by the [place] FDA and  has been authorized for detection and/or diagnosis of SARS-CoV-2 by FDA under an Emergency Use Authorization (EUA). This EUA will remain  in effect (meaning this test can be used) for the duration of the COVID-19 declaration under Section 564(b)(1) of the Act, 21 U.S.C.section 360bbb-3(b)(1), unless the authorization is terminated  or revoked sooner.       Influenza A by PCR NEGATIVE NEGATIVE Final   Influenza B by PCR NEGATIVE NEGATIVE Final    Comment: (NOTE) The Xpert Xpress SARS-CoV-2/FLU/RSV plus assay is intended as an aid in the diagnosis of influenza from Nasopharyngeal swab specimens and should not be used as a sole basis for treatment. Nasal washings and aspirates are unacceptable for Xpert Xpress SARS-CoV-2/FLU/RSV testing.  Fact Sheet for Patients: EntrepreneurPulse.com.au  Fact Sheet for Healthcare Providers: IncredibleEmployment.be  This test is not yet approved or cleared by the [place] FDA and has been authorized for detection and/or diagnosis of SARS-CoV-2 by FDA under an Emergency Use Authorization (EUA). This EUA will remain in effect (meaning this test can be used) for the duration of the COVID-19 declaration under Section 564(b)(1) of the Act, 21 U.S.C. section 360bbb-3(b)(1), unless the authorization is terminated or revoked.  Performed at [facility], [place] [address] [place], [place] [address]   MRSA Next Gen by PCR, Nasal     Status: None   Collection Time: 06/11/21  7:01 PM   Specimen: Nasal Mucosa; Nasal Swab  Result Value Ref Range Status   MRSA by PCR Next Gen NOT DETECTED NOT DETECTED Final    Comment:  (NOTE) The GeneXpert MRSA Assay (FDA approved for NASAL specimens only), is one component of a comprehensive MRSA colonization surveillance program. It is not intended to diagnose MRSA infection nor to guide or monitor treatment for MRSA infections. Test performance is not FDA approved in patients less than 13 years old. Performed at [facility], [place] [address], [place], [place] [address]     Radiology Reports DG Chest 1 View  Result Date: 06/20/2021 CLINICAL DATA:  Shortness of breath. EXAM: CHEST  1 VIEW COMPARISON:  06/11/2021 FINDINGS: Heart size remains stable. Decreased interstitial infiltrates are seen since previous study, consistent with decreased interstitial edema. Previously seen small bilateral pleural effusions have also resolved. IMPRESSION: Decreased pulmonary interstitial edema and pleural effusions, consistent with resolving congestive heart failure. Electronically Signed   By: Marlaine Hind M.D.   On: 06/20/2021 14:18   NM Pulmonary Perfusion  Result Date: 06/20/2021 CLINICAL DATA:  Shortness of breath. EXAM: NUCLEAR MEDICINE PERFUSION LUNG SCAN TECHNIQUE: Perfusion images were obtained in multiple projections after intravenous injection of radiopharmaceutical. Ventilation scans intentionally deferred if perfusion scan and chest x-ray adequate for interpretation during COVID 19 epidemic. RADIOPHARMACEUTICALS:  4.4 mCi Tc-31m MAA IV COMPARISON:  January 20, 2021 FINDINGS: No segmental perfusion abnormalities identified. IMPRESSION: No evidence of pulmonary embolus on this study. Electronically Signed   By: Dorise Bullion III M.D.   On: 06/20/2021 15:30   CARDIAC CATHETERIZATION  Result Date: 06/19/2021 1. Right and left heart filling pressures are mildly elevated. 2. Moderate-severe PAH with PVR 4 WU. 3. Low cardiac index, 1.94. Will continue diuresis with IV Lasix infusion today but hold off on metolazone with rising creatinine and filling pressures not markedly high.  PH is likely from OHS/OSA, but will arrange for V/Q scan to rule out chronic PE.   DG Chest Portable 1 View  Result Date: 06/11/2021 CLINICAL DATA:  Shortness of breath. EXAM: PORTABLE CHEST 1 VIEW COMPARISON:  06/04/2021 FINDINGS: 1344 hours. Low volume lordotic film. The cardio pericardial silhouette is enlarged. Diffuse interstitial opacity again noted. Bibasilar atelectasis/infiltrate again noted with tiny bilateral pleural effusions. The visualized bony structures of the thorax show no acute abnormality. Telemetry leads overlie the chest. IMPRESSION: Bibasilar atelectasis/infiltrate with tiny bilateral pleural effusions. Probable component of interstitial edema. Electronically Signed   By: Misty Stanley M.D.   On: 06/11/2021 13:52   DG Chest Port 1 View  Result Date: 06/04/2021 CLINICAL DATA:  Short of breath EXAM: PORTABLE CHEST 1 VIEW COMPARISON:  01/30/2021 FINDINGS: Cardiac enlargement. Pulmonary vascularity with mild interstitial edema. Small bilateral effusions and bibasilar atelectasis. IMPRESSION: Congestive heart failure with mild interstitial edema and small bilateral effusions. Bibasilar atelectasis. Electronically Signed   By: Franchot Gallo M.D.   On: 06/04/2021 18:51   ECHOCARDIOGRAM COMPLETE  Result Date: 06/19/2021    ECHOCARDIOGRAM REPORT   Patient Name:   [name] [name] Date of Exam: 06/19/2021 Medical Rec #:  [id number]     Height:       73.0 in Accession #:    [id number]    Weight:       292.3 lb Date of Birth:  24-Jun-1946     BSA:          2.528 m Patient Age:    44 years      BP:           102/67 mmHg Patient Gender: M             HR:           47 bpm. Exam Location:  Inpatient Procedure: 2D Echo, Cardiac Doppler, Color Doppler and Intracardiac            Opacification Agent Indications:    CHF-Acute Diastolic L87.56  History:        Patient has prior history of  Echocardiogram examinations, most                 recent 12/28/2020. CAD and Previous Myocardial Infarction,                  Arrythmias:LBBB and Atrial Fibrillation; Risk                 Factors:Dyslipidemia, Hypertension, Sleep Apnea and Diabetes.                 Thyroid disease.  Sonographer:    Darlina Sicilian RDCS Referring Phys: [place]  1. Left ventricular ejection fraction, by estimation, is 45 to 50%. The left ventricle has mildly decreased function. The left ventricle demonstrates global hypokinesis. The left ventricular internal cavity size was severely dilated. Left ventricular diastolic function could not be evaluated.  2. Right ventricular systolic function is moderately reduced. The right ventricular size is not well visualized. There is moderately elevated pulmonary artery systolic pressure.  3. Left atrial size was moderately dilated.  4. The mitral valve is grossly normal. Trivial mitral valve regurgitation. No evidence of mitral stenosis.  5. The aortic valve is grossly normal. There is mild calcification of the aortic valve. Aortic valve regurgitation is trivial. Aortic valve sclerosis is present, with no evidence of aortic valve stenosis.  6. Aortic dilatation noted. There is borderline dilatation of the ascending aorta, measuring 39 mm.  7. The inferior vena cava is dilated in size with <50% respiratory variability, suggesting right atrial pressure of 15 mmHg. Comparison(s): Changes from prior study are noted. EF slightly decreased (globally) compared to prior. Conclusion(s)/Recommendation(s): Significant bradycardia, down to 40 bpm, noted during study. FINDINGS  Left Ventricle: Left ventricular ejection fraction, by estimation, is 45 to 50%. The left ventricle has mildly decreased function. The left ventricle demonstrates global hypokinesis. Definity contrast agent was given IV to delineate the left ventricular  endocardial borders. The left ventricular internal cavity size was severely dilated. There is no left ventricular hypertrophy. Left ventricular diastolic function could not be  evaluated due to atrial fibrillation. Left ventricular diastolic function could not be evaluated. Right Ventricle: The right ventricular size is not well visualized. Right vetricular wall thickness was not well visualized. Right ventricular systolic function is moderately reduced. There is moderately elevated pulmonary artery systolic pressure. The tricuspid regurgitant velocity is 3.07 m/s, and with an assumed right atrial pressure of 15 mmHg, the estimated right ventricular systolic pressure is 93.2 mmHg. Left Atrium: Left atrial size was moderately dilated. Right Atrium: Right atrial size was not well visualized. Pericardium: There is no evidence of pericardial effusion. Mitral Valve: The mitral valve is grossly normal. Trivial mitral valve regurgitation. No evidence of mitral valve stenosis. Tricuspid Valve: The tricuspid valve is grossly normal. Tricuspid valve regurgitation is trivial. No evidence of tricuspid stenosis. Aortic Valve: The aortic valve is grossly normal. There is mild calcification of the aortic valve. Aortic valve regurgitation is trivial. Aortic valve sclerosis is present, with no evidence of aortic valve stenosis. Pulmonic Valve: The pulmonic valve was not well visualized. Pulmonic valve regurgitation is trivial. No evidence of pulmonic stenosis. Aorta: Aortic dilatation noted. There is borderline dilatation of the ascending aorta, measuring 39 mm. Venous: The inferior vena cava is dilated in size with less than 50% respiratory variability, suggesting right atrial pressure of 15 mmHg. IAS/Shunts: The interatrial septum was not well visualized.  LEFT VENTRICLE PLAX 2D LVIDd:         6.40 cm LVIDs:  4.90 cm LV PW:         1.00 cm LV IVS:        1.10 cm LVOT diam:     2.30 cm LV SV:         99 LV SV Index:   39 LVOT Area:     4.15 cm  LV Volumes (MOD) LV vol d, MOD A2C: 162.0 ml LV vol d, MOD A4C: 183.0 ml LV vol s, MOD A2C: 95.1 ml LV vol s, MOD A4C: 84.9 ml LV SV MOD A2C:     66.9 ml  LV SV MOD A4C:     183.0 ml LV SV MOD BP:      83.6 ml RIGHT VENTRICLE RV S prime:     6.98 cm/s TAPSE (M-mode): 1.3 cm LEFT ATRIUM              Index LA diam:        4.90 cm  1.94 cm/m LA Vol (A2C):   68.8 ml  27.21 ml/m LA Vol (A4C):   104.0 ml 41.13 ml/m LA Biplane Vol: 90.7 ml  35.87 ml/m  AORTIC VALVE LVOT Vmax:   124.00 cm/s LVOT Vmean:  96.900 cm/s LVOT VTI:    0.238 m  AORTA Ao Root diam: 2.70 cm Ao Asc diam:  3.90 cm MITRAL VALVE               TRICUSPID VALVE MV Area (PHT): 3.61 cm    TR Peak grad:   37.7 mmHg MV Decel Time: 210 msec    TR Vmax:        307.00 cm/s MV E velocity: 92.95 cm/s                            SHUNTS                            Systemic VTI:  0.24 m                            Systemic Diam: 2.30 cm Buford Dresser MD Electronically signed by Buford Dresser MD Signature Date/Time: 06/19/2021/4:34:56 PM    Final    CT EXTREMITY LOWER LEFT WO CONTRAST  Result Date: 06/20/2021 CLINICAL DATA:  Left lower leg pain. EXAM: CT OF THE LOWER LEFT EXTREMITY WITHOUT CONTRAST TECHNIQUE: Multidetector CT imaging of the lower left extremity was performed according to the standard protocol. COMPARISON:  Left knee x-rays dated March 09, 2011. FINDINGS: Bones/Joint/Cartilage Prior left total knee arthroplasty. No evidence of hardware failure or loosening. No fracture or dislocation. Joint spaces are preserved. Small knee joint effusion. Ligaments Ligaments are suboptimally evaluated by CT. Muscles and Tendons Scattered lower leg muscle atrophy, most prominently involving the medial gastrocnemius muscle. Soft tissue Diffuse circumferential soft tissue swelling of the left lower leg. No fluid collection or subcutaneous emphysema. No soft tissue mass. IMPRESSION: 1. Diffuse circumferential soft tissue swelling of the left lower leg, nonspecific, but can be seen with cellulitis. No abscess. 2. Prior left total knee arthroplasty without evidence of hardware complication. Electronically  Signed   By: Titus Dubin M.D.   On: 06/20/2021 15:09    Lab Data:  CBC: Recent Labs  Lab 06/19/21 1238  HGB 14.3  14.3  HCT 42.0  86.5   Basic Metabolic Panel: Recent Labs  Lab 06/17/21 0055 06/18/21 0846  06/19/21 0326 06/19/21 1238 06/20/21 0101 06/21/21 0048  NA 137 139 136 139  139 138 138  K 3.2* 3.8 3.3* 3.4*  3.5 3.4* 3.6  CL 101 97* 95*  --  96* 94*  CO2 24 32 31  --  32 31  GLUCOSE 83 107* 90  --  118* 108*  BUN 72* 78* 89*  --  102* 102*  CREATININE 2.48* 2.70* 3.07*  --  3.32* 3.29*  CALCIUM 8.6* 8.9 9.0  --  9.2 9.0  MG  --   --   --   --  2.4  --    GFR: Estimated Creatinine Clearance: 27.8 mL/min (A) (by C-G formula based on SCr of 3.29 mg/dL (H)). Liver Function Tests: No results for input(s): AST, ALT, ALKPHOS, BILITOT, PROT, ALBUMIN in the last 168 hours. No results for input(s): LIPASE, AMYLASE in the last 168 hours. No results for input(s): AMMONIA in the last 168 hours. Coagulation Profile: No results for input(s): INR, PROTIME in the last 168 hours. Cardiac Enzymes: No results for input(s): CKTOTAL, CKMB, CKMBINDEX, TROPONINI in the last 168 hours. BNP (last 3 results) No results for input(s): PROBNP in the last 8760 hours. HbA1C: No results for input(s): HGBA1C in the last 72 hours. CBG: Recent Labs  Lab 06/20/21 1555 06/20/21 1657 06/20/21 2108 06/21/21 0610 06/21/21 1131  GLUCAP 96 89 174* 113* 95   Lipid Profile: No results for input(s): CHOL, HDL, LDLCALC, TRIG, CHOLHDL, LDLDIRECT in the last 72 hours. Thyroid Function Tests: No results for input(s): TSH, T4TOTAL, FREET4, T3FREE, THYROIDAB in the last 72 hours. Anemia Panel: No results for input(s): VITAMINB12, FOLATE, FERRITIN, TIBC, IRON, RETICCTPCT in the last 72 hours. Urine analysis:    Component Value Date/Time   COLORURINE YELLOW 11/19/2020 1736   APPEARANCEUR CLEAR 11/19/2020 1736   LABSPEC 1.015 11/19/2020 1736   PHURINE 6.0 11/19/2020 1736   GLUCOSEU  NEGATIVE 11/19/2020 1736   HGBUR NEGATIVE 11/19/2020 1736   BILIRUBINUR NEGATIVE 11/19/2020 1736   KETONESUR NEGATIVE 11/19/2020 1736   PROTEINUR >=300 (A) 11/19/2020 1736   UROBILINOGEN 4.0 (H) 07/23/2014 1356   NITRITE NEGATIVE 11/19/2020 1736   LEUKOCYTESUR NEGATIVE 11/19/2020 1736     Marialuisa Basara M.D. [facility] 06/21/2021, 1:02 PM  Available via Epic secure chat 7am-7pm After 7 pm, please refer to night coverage provider listed on amion.

## 2021-06-21 NOTE — Progress Notes (Signed)
Patient ID: [name] [name], male   DOB: 12-25-1945, 75 y.o.   MRN: [id number]     Advanced Heart Failure Rounding Note  PCP-Cardiologist: Rozann Lesches, MD   Subjective:     Underwent TEE/DC-CV yesterday. EF 45-50%. Remains in NSR  Lasix gtt switch to oral torsemide. Weight down another 2 pounds  Scr starting to plateau 2.5 > 2.7 > 3.1 > 3.3 -> 3.3  Denies SOB, orthopnea or PND.    RHC 11/17: RA mean 10 RV 67/10 PA 69/22 PCWP mean 18 Oxygen saturations: PA 63% AO 99% Cardiac Output (Fick) 4.91  Cardiac Index (Fick) 1.94 PVR 4.1 WU PAPI 5.7  Objective:   Weight Range: 130 kg Body mass index is 37.81 kg/m.   Vital Signs:   Temp:  [97.2 F (36.2 C)-97.7 F (36.5 C)] 97.5 F (36.4 C) (11/19 0758) Pulse Rate:  [50-71] 55 (11/19 0758) Resp:  [15-20] 17 (11/19 0758) BP: (96-119)/(47-75) 112/61 (11/19 0758) SpO2:  [93 %-100 %] 93 % (11/19 0758) FiO2 (%):  [32 %] 32 % (11/19 0753) Weight:  [130 kg-131 kg] 130 kg (11/19 0300) Last BM Date: 06/20/21  Weight change: Filed Weights   06/20/21 0302 06/20/21 1459 06/21/21 0300  Weight: 131 kg 131 kg 130 kg    Intake/Output:   Intake/Output Summary (Last 24 hours) at 06/21/2021 1108 Last data filed at 06/21/2021 1000 Gross per 24 hour  Intake 1623 ml  Output 1725 ml  Net -102 ml       Physical Exam    General:  Sitting in chair No resp difficulty HEENT: normal Neck: supple. JVP 9-10  Carotids 2+ bilat; no bruits. No lymphadenopathy or thryomegaly appreciated. Cor: PMI nondisplaced. Regular rate & rhythm. No rubs, gallops or murmurs. Lungs: clear Abdomen: obese soft, nontender, nondistended. No hepatosplenomegaly. No bruits or masses. Good bowel sounds. Extremities: no cyanosis, clubbing, rash, 2+ edema + UNNA  mild erythema on lower left thigh Neuro: alert & orientedx3, cranial nerves grossly intact. moves all 4 extremities w/o difficulty. Affect pleasant    Telemetry   Sinus 50s + PVCs Personally  reviewed  Labs    CBC Recent Labs    06/19/21 1238  HGB 14.3  14.3  HCT 42.0  37.9    Basic Metabolic Panel Recent Labs    06/20/21 0101 06/21/21 0048  NA 138 138  K 3.4* 3.6  CL 96* 94*  CO2 32 31  GLUCOSE 118* 108*  BUN 102* 102*  CREATININE 3.32* 3.29*  CALCIUM 9.2 9.0  MG 2.4  --     Liver Function Tests No results for input(s): AST, ALT, ALKPHOS, BILITOT, PROT, ALBUMIN in the last 72 hours. No results for input(s): LIPASE, AMYLASE in the last 72 hours. Cardiac Enzymes No results for input(s): CKTOTAL, CKMB, CKMBINDEX, TROPONINI in the last 72 hours.  BNP: BNP (last 3 results) Recent Labs    01/30/21 0312 06/04/21 1820 06/11/21 1338  BNP 267.0* 446.0* 199.1*     ProBNP (last 3 results) No results for input(s): PROBNP in the last 8760 hours.   D-Dimer No results for input(s): DDIMER in the last 72 hours. Hemoglobin A1C No results for input(s): HGBA1C in the last 72 hours. Fasting Lipid Panel No results for input(s): CHOL, HDL, LDLCALC, TRIG, CHOLHDL, LDLDIRECT in the last 72 hours. Thyroid Function Tests No results for input(s): TSH, T4TOTAL, T3FREE, THYROIDAB in the last 72 hours.  Invalid input(s): FREET3  Other results:   Imaging    DG Chest 1  View  Result Date: 06/20/2021 CLINICAL DATA:  Shortness of breath. EXAM: CHEST  1 VIEW COMPARISON:  06/11/2021 FINDINGS: Heart size remains stable. Decreased interstitial infiltrates are seen since previous study, consistent with decreased interstitial edema. Previously seen small bilateral pleural effusions have also resolved. IMPRESSION: Decreased pulmonary interstitial edema and pleural effusions, consistent with resolving congestive heart failure. Electronically Signed   By: Marlaine Hind M.D.   On: 06/20/2021 14:18   NM Pulmonary Perfusion  Result Date: 06/20/2021 CLINICAL DATA:  Shortness of breath. EXAM: NUCLEAR MEDICINE PERFUSION LUNG SCAN TECHNIQUE: Perfusion images were obtained in  multiple projections after intravenous injection of radiopharmaceutical. Ventilation scans intentionally deferred if perfusion scan and chest x-ray adequate for interpretation during COVID 19 epidemic. RADIOPHARMACEUTICALS:  4.4 mCi Tc-31m MAA IV COMPARISON:  January 20, 2021 FINDINGS: No segmental perfusion abnormalities identified. IMPRESSION: No evidence of pulmonary embolus on this study. Electronically Signed   By: Dorise Bullion III M.D.   On: 06/20/2021 15:30     Medications:     Scheduled Medications:  amiodarone  400 mg Oral Daily   apixaban  5 mg Oral BID   docusate sodium  100 mg Oral BID   insulin aspart  0-15 Units Subcutaneous TID WC   insulin aspart  0-5 Units Subcutaneous QHS   insulin glargine-yfgn  26 Units Subcutaneous Q2200   isosorbide mononitrate  30 mg Oral Daily   levothyroxine  300 mcg Oral QAC breakfast   loratadine  5 mg Oral Daily   mometasone-formoterol  2 puff Inhalation BID   nutrition supplement (JUVEN)  1 packet Oral BID BM   simvastatin  5 mg Oral Daily   sodium chloride flush  3 mL Intravenous Q12H   sodium chloride flush  3 mL Intravenous Q12H    Infusions:    PRN Medications: acetaminophen **OR** acetaminophen, albuterol, alum & mag hydroxide-simeth, bisacodyl, hydrALAZINE, loperamide, morphine injection, ondansetron **OR** ondansetron (ZOFRAN) IV, oxyCODONE, polyethylene glycol, sodium chloride, zolpidem    Assessment/Plan   1. Acute on chronic diastolic CHF:  -In setting of CKD stage IV and persistent atrial fibrillation.   -Echo in 5/22 with EF 55-60%, mild RV enlargement, normal RV systolic function.  Admitted with marked volume overload.  -Echo this admit with EF 45-50%, LV severely dilated, RV moderately reduced, RVSP 53 mmHg -RHC 11/17 - RA mean, PCWP mean 18, moderate to severe PAH with PVR 4 WU, CI low at 1.94 - TEE 11/18 EF 45-50% - Lasix gtt stopped 11/18. Now on torsemide 60 bid. Weight down 20 pounds total. Still with some  volume overload. Continue to diurese with po torsemide. Scr appears to be plateauing.  BUN stable 102.  -Continue current regimen -May be able to eventually start Jardiance, follow GFR.  (GFR 19 today) 2. Pulmonary hypertension: -Moderate to severe PAH with PVR 4 WU on RHC 06/19/21 -Likely due to OHS/OSA -V/Q scan to r/o chronic PE 3. Atrial fibrillation: Persistent since 12/21, DCCV never attempted.  Suspect he will not stay in NSR without anti-arrhythmic. S/p DC-CV on 11/18. Remains in NSR  - Bisoprolol stopped with bradycardia.  - Continue amio 400 mg daily.  - Continue apixaban 5 mg bid.  4. CAD: S/p NSTEMI with occluded OM1 in 6/18, had DES to 80% stenosis mLAD at that time.  No s/s angina  - No coronary angiography in absence of ACS given CKD stage 4.  - Continue statin.  - No ASA given Eliquis use.  5. AKI on CKD: Stage IV.  -  Baseline Scr 2.6-2.7 - Scr seems to be plateauing at 3.3 today. BUN 102 See above regarding diuretics.  - Likely nearing point that he may need HD  6. Type 2 DM: Continue insulin.  7. Chronic hypoxemic respiratory failure: He is on 2L home oxygen, O2 sats stable on 3L here.  ?OHS/OSA, ?COPD.  He smoked in the past but not heavily.  - Would get PFTs after diuresis.  - Needs home sleep study.  8. Hypokalemia: - K 3.6 - Supp K 8. Left lower extremity pain: - CT suggestive of diffuse tissue swelling ? cellulitis - Uric acid 13.1 concerning for acute gout. Consider prednisone. No colchicine with CKD IV.  - management per TRH.  Length of Stay: [place], MD  06/21/2021, 11:08 AM  Advanced Heart Failure Team Pager [phone] (M-F; 7a - 5p)  Please contact [facility] for night-coverage after hours (5p -7a ) and weekends on [email or web address]

## 2021-06-22 DIAGNOSIS — I10 Essential (primary) hypertension: Secondary | ICD-10-CM

## 2021-06-22 DIAGNOSIS — I5033 Acute on chronic diastolic (congestive) heart failure: Secondary | ICD-10-CM | POA: Diagnosis not present

## 2021-06-22 DIAGNOSIS — I509 Heart failure, unspecified: Secondary | ICD-10-CM

## 2021-06-22 DIAGNOSIS — I480 Paroxysmal atrial fibrillation: Secondary | ICD-10-CM | POA: Diagnosis not present

## 2021-06-22 LAB — BASIC METABOLIC PANEL
Anion gap: 10 (ref 5–15)
BUN: 109 mg/dL — ABNORMAL HIGH (ref 8–23)
CO2: 31 mmol/L (ref 22–32)
Calcium: 8.6 mg/dL — ABNORMAL LOW (ref 8.9–10.3)
Chloride: 95 mmol/L — ABNORMAL LOW (ref 98–111)
Creatinine, Ser: 3.07 mg/dL — ABNORMAL HIGH (ref 0.61–1.24)
GFR, Estimated: 21 mL/min — ABNORMAL LOW (ref >60)
Glucose, Bld: 165 mg/dL — ABNORMAL HIGH (ref 70–99)
Potassium: 4.2 mmol/L (ref 3.5–5.1)
Sodium: 136 mmol/L (ref 135–145)

## 2021-06-22 LAB — GLUCOSE, CAPILLARY
Glucose-Capillary: 235 mg/dL — ABNORMAL HIGH (ref 70–99)
Glucose-Capillary: 178 mg/dL — ABNORMAL HIGH (ref 70–99)
Glucose-Capillary: 155 mg/dL — ABNORMAL HIGH (ref 70–99)
Glucose-Capillary: 156 mg/dL — ABNORMAL HIGH (ref 70–99)

## 2021-06-22 MED ORDER — INSULIN GLARGINE-YFGN 100 UNIT/ML ~~LOC~~ SOLN
29.0000 [IU] | Freq: Every day | SUBCUTANEOUS | Status: DC
  Administered 2021-06-22: 29 [IU] via SUBCUTANEOUS
  Filled 2021-06-22 (×2): qty 0.29

## 2021-06-22 MED ORDER — METOLAZONE 2.5 MG PO TABS
2.5000 mg | ORAL_TABLET | Freq: Once | ORAL | Status: AC
  Administered 2021-06-22: 2.5 mg via ORAL
  Filled 2021-06-22: qty 1

## 2021-06-22 MED ORDER — CHLORPROMAZINE HCL 25 MG/ML IJ SOLN
25.0000 mg | Freq: Two times a day (BID) | INTRAMUSCULAR | Status: DC | PRN
  Administered 2021-06-22: 25 mg via INTRAMUSCULAR
  Filled 2021-06-22 (×2): qty 1

## 2021-06-22 MED ORDER — POTASSIUM CHLORIDE CRYS ER 20 MEQ PO TBCR
40.0000 meq | EXTENDED_RELEASE_TABLET | Freq: Once | ORAL | Status: AC
  Administered 2021-06-22: 40 meq via ORAL
  Filled 2021-06-22: qty 2

## 2021-06-22 NOTE — Progress Notes (Signed)
Patient ID: [name] [name], male   DOB: 1945/12/20, 75 y.o.   MRN: [id number]     Advanced Heart Failure Rounding Note  PCP-Cardiologist: Rozann Lesches, MD   Subjective:     Underwent TEE/DC-CV 11/18. EF 45-50%. Remains in NSR  On oral torsemide. Weight up 3 pounds  Scr starting to improve  2.5 > 2.7 > 3.1 > 3.3 -> 3.3 -> 3.07  Denies SOB, orthopnea or PND. Anxious to go home    RHC 11/17: RA mean 10 RV 67/10 PA 69/22 PCWP mean 18 Oxygen saturations: PA 63% AO 99% Cardiac Output (Fick) 4.91  Cardiac Index (Fick) 1.94 PVR 4.1 WU PAPI 5.7  Objective:   Weight Range: 131.3 kg Body mass index is 38.19 kg/m.   Vital Signs:   Temp:  [97.5 F (36.4 C)-98.1 F (36.7 C)] 97.5 F (36.4 C) (11/20 0758) Pulse Rate:  [53-61] 56 (11/20 0758) Resp:  [13-18] 16 (11/20 0758) BP: (109-132)/(52-71) 128/71 (11/20 0758) SpO2:  [94 %-100 %] 100 % (11/20 0818) Weight:  [131.3 kg] 131.3 kg (11/20 0500) Last BM Date: 06/21/21  Weight change: Filed Weights   06/20/21 1459 06/21/21 0300 06/22/21 0500  Weight: 131 kg 130 kg 131.3 kg    Intake/Output:   Intake/Output Summary (Last 24 hours) at 06/22/2021 1029 Last data filed at 06/22/2021 0843 Gross per 24 hour  Intake 680 ml  Output 2400 ml  Net -1720 ml       Physical Exam    General:  Obese male sitting in chair . No resp difficulty HEENT: normal Neck: supple. JVP to jaw  Carotids 2+ bilat; no bruits. No lymphadenopathy or thryomegaly appreciated. Cor: PMI nondisplaced. Regular rate & rhythm. No rubs, gallops or murmurs. Lungs: clear Abdomen: obese  soft, nontender, nondistended. No hepatosplenomegaly. No bruits or masses. Good bowel sounds. Extremities: no cyanosis, clubbing, rash, 2-3+ edema over UNNA  Mild erythema over theft thigh Neuro: alert & orientedx3, cranial nerves grossly intact. moves all 4 extremities w/o difficulty. Affect pleasant   Telemetry   Sinus 50s + PVCs Personally reviewed   Labs     CBC Recent Labs    06/19/21 1238  HGB 14.3  14.3  HCT 42.0  70.0    Basic Metabolic Panel Recent Labs    06/20/21 0101 06/21/21 0048 06/22/21 0044  NA 138 138 136  K 3.4* 3.6 4.2  CL 96* 94* 95*  CO2 32 31 31  GLUCOSE 118* 108* 165*  BUN 102* 102* 109*  CREATININE 3.32* 3.29* 3.07*  CALCIUM 9.2 9.0 8.6*  MG 2.4  --   --     Liver Function Tests No results for input(s): AST, ALT, ALKPHOS, BILITOT, PROT, ALBUMIN in the last 72 hours. No results for input(s): LIPASE, AMYLASE in the last 72 hours. Cardiac Enzymes No results for input(s): CKTOTAL, CKMB, CKMBINDEX, TROPONINI in the last 72 hours.  BNP: BNP (last 3 results) Recent Labs    01/30/21 0312 06/04/21 1820 06/11/21 1338  BNP 267.0* 446.0* 199.1*     ProBNP (last 3 results) No results for input(s): PROBNP in the last 8760 hours.   D-Dimer No results for input(s): DDIMER in the last 72 hours. Hemoglobin A1C No results for input(s): HGBA1C in the last 72 hours. Fasting Lipid Panel No results for input(s): CHOL, HDL, LDLCALC, TRIG, CHOLHDL, LDLDIRECT in the last 72 hours. Thyroid Function Tests No results for input(s): TSH, T4TOTAL, T3FREE, THYROIDAB in the last 72 hours.  Invalid input(s): FREET3  Other  results:   Imaging    No results found.   Medications:     Scheduled Medications:  amiodarone  400 mg Oral Daily   apixaban  5 mg Oral BID   cephALEXin  500 mg Oral Q12H   docusate sodium  100 mg Oral BID   insulin aspart  0-15 Units Subcutaneous TID WC   insulin aspart  0-5 Units Subcutaneous QHS   insulin glargine-yfgn  26 Units Subcutaneous Q2200   isosorbide mononitrate  30 mg Oral Daily   levothyroxine  300 mcg Oral QAC breakfast   loratadine  5 mg Oral Daily   mometasone-formoterol  2 puff Inhalation BID   nutrition supplement (JUVEN)  1 packet Oral BID BM   predniSONE  40 mg Oral QAC breakfast   simvastatin  5 mg Oral Daily   sodium chloride flush  3 mL Intravenous Q12H    sodium chloride flush  3 mL Intravenous Q12H   torsemide  60 mg Oral BID    Infusions:    PRN Medications: acetaminophen **OR** acetaminophen, albuterol, alum & mag hydroxide-simeth, bisacodyl, hydrALAZINE, loperamide, morphine injection, ondansetron **OR** ondansetron (ZOFRAN) IV, oxyCODONE, polyethylene glycol, sodium chloride, zolpidem    Assessment/Plan   1. Acute on chronic diastolic CHF:  -In setting of CKD stage IV and persistent atrial fibrillation.   -Echo in 5/22 with EF 55-60%, mild RV enlargement, normal RV systolic function.  Admitted with marked volume overload.  -Echo this admit with EF 45-50%, LV severely dilated, RV moderately reduced, RVSP 53 mmHg -RHC 11/17 - RA mean, PCWP mean 18, moderate to severe PAH with PVR 4 WU, CI low at 1.94 - TEE 11/18 EF 45-50% - Lasix gtt stopped 11/18. Now on torsemide 60 bid. Weight down 20 pounds total. Still with some volume overload. Weight increasing today. Scr appears to be plateauing.  BUN remains high 102 -> 109. Continue torsemide 60 bid. Give dose of metolazone 2.5 today. May need to increase torsemide as well  -Continue current regimen -May be able to eventually start Jardiance, follow GFR.  (GFR 21 today) 2. Pulmonary hypertension: -Moderate to severe PAH with PVR 4 WU on RHC 06/19/21 -Likely due to OHS/OSA -V/Q scan to r/o chronic PE 3. Atrial fibrillation: Persistent since 12/21, DCCV never attempted.  Suspect he will not stay in NSR without anti-arrhythmic. S/p DC-CV on 11/18. Remains in NSR this am  - Bisoprolol stopped with bradycardia.  - Continue amio 400 mg daily.  - Continue apixaban 5 mg bid.  4. CAD: S/p NSTEMI with occluded OM1 in 6/18, had DES to 80% stenosis mLAD at that time.  No s/s angina  - No coronary angiography in absence of ACS given CKD stage 4.  - Continue statin.  - No ASA given Eliquis use.  5. AKI on CKD: Stage IV.  - Baseline Scr 2.6-2.7 - Scr slightly improved 3.3 -> 3.07 today. BUN 102 ->  109 See above regarding diuretics.  - Likely nearing point that he may need HD  6. Type 2 DM: Continue insulin.  7. Chronic hypoxemic respiratory failure: He is on 2L home oxygen, O2 sats stable on 3L here.  ?OHS/OSA, ?COPD.  He smoked in the past but not heavily.  - Would get PFTs after diuresis.  - Needs home sleep study.  8. Hypokalemia: - K 4.2 8. Left lower extremity pain: - CT suggestive of diffuse tissue swelling ? cellulitis - Uric acid 13.1 concerning for acute gout. Consider prednisone. No colchicine with CKD IV.  -  management per TRH. Cephalexin started 11/19  Length of Stay: [place], MD  06/22/2021, 10:29 AM  Advanced Heart Failure Team Pager [phone] (M-F; 7a - 5p)  Please contact [facility] for night-coverage after hours (5p -7a ) and weekends on [email or web address]

## 2021-06-22 NOTE — Anesthesia Postprocedure Evaluation (Signed)
Anesthesia Post Note  Patient: [name] [name]  Procedure(s) Performed: TRANSESOPHAGEAL ECHOCARDIOGRAM (TEE) CARDIOVERSION     Patient location during evaluation: PACU Anesthesia Type: MAC Level of consciousness: awake and alert Pain management: pain level controlled Vital Signs Assessment: post-procedure vital signs reviewed and stable Respiratory status: spontaneous breathing, nonlabored ventilation, respiratory function stable and patient connected to nasal cannula oxygen Cardiovascular status: stable and blood pressure returned to baseline Postop Assessment: no apparent nausea or vomiting Anesthetic complications: no   No notable events documented.  Last Vitals:  Vitals:   06/22/21 1739 06/22/21 1959  BP: 122/61   Pulse: (!) 58   Resp: 18   Temp: 36.7 C   SpO2: 97% 96%    Last Pain:  Vitals:   06/22/21 1739  TempSrc: Oral  PainSc: 0-No pain                 Aerith Canal

## 2021-06-22 NOTE — Plan of Care (Signed)

## 2021-06-22 NOTE — Progress Notes (Addendum)
[facility]                                                                              Patient Demographics  Kacper Cartlidge, is a 75 y.o. male, DOB - 02-Jan-1946, FYB:[id number]  Admit date - 06/11/2021   Admitting Physician Darliss Cheney, MD  Outpatient Primary MD for the patient is Nickola Major, MD  Outpatient specialists:   LOS - 10  days   Medical records reviewed and are as summarized below:    Chief Complaint  Patient presents with   Shortness of Breath       Brief summary   Patient is a 75 year old male with A. fib, CAD status post stent, CKD stage III, HTN, HLD, COPD, hypothyroidism, morbid obesity, NHL treated with XRT and chemo, DM, OSA presented with shortness of breath.  Currently on O2 3 to 4 L via was feeling shortness of breath.  Patient PND, unchanged from prior, sleeps in a recliner.  Patient's weight was up about 9 lbs on admission.  Patient admits to 3 doses a week of medications.   In ED, he was found to be acute on chronic hypoxic respiratory failure, desatted to 60s on 4 L., was placed on NRB and quickly weaned down to 4 L.  Patient was admitted with acute on chronic diastolic CHF Initially placed on IV Lasix with minimal urine output.  CHF team consulted, subsequently placed on IV Lasix GTT, underwent TEE/DCCV on 11/18   Assessment & Plan    Principal Problem: Acute on chronic respiratory failure with hypoxia secondary to acute on chronic diastolic CHF (congestive heart failure) (HCC) -Outpatient on 3 to 4 L of O2, currently on 4 L. -Minimal output with IV Lasix bolus doses and improved with Lasix infusion and metolazone.  CHF team was consulted. -Negative balance of 14.1 L, weight went up 3lbs today.  Overall, weight down from 304 lbs on admission to 289 currently -Creatinine now trending down, 3.0.  IV Lasix on hold, started on oral torsemide, still has significant bilateral lower extremity edema   Active Problems:   Chronic  kidney disease Stage IV -Baseline creatinine 2.6-2.8, plateaued at 3.3, IV Lasix infusion placed on hold - Creatinine now improving, 3.0    Morbid obesity ([place]), lower extremity edema, venous stasis/cellulitis L LE, ?  Acute gout -CT left lower extremity with diffuse circumferential soft tissue swelling of left lower leg, nonspecific but can be seen in cellulitis, no abscess -Continue Keflex,  venous stasis with edema, erythema, tenderness -Uric acid 13.1, added prednisone 40 mg daily for 3 days  Atrial fibrillation, persistent -Underwent TEE/DCCV on 11/18 -Beta-blocker stopped due to bradycardia, placed on amiodarone for daily -On apixaban   Moderate to severe pulmonary hypertension -Moderately severe PAH likely due to COPD, OHS/OSA -VQ scan 11/18 negative for any PE  COPD with possible acute exacerbation -No wheezing, continue Dulera, albuterol as needed  Possible OSA -  Need sleep study as outpatient  HTN -BP stable, continue Imdur, torsemide  Diabetes mellitus type 2, IDDM uncontrolled with hyperglycemia, CKD stage IV -A1c 5.8 in 01/2021 Recent Labs    06/21/21 0610 06/21/21  [place] 06/21/21 1605 06/21/21 2125 06/22/21 0639 06/22/21 1145  GLUCAP 113* 95 119* 216* 235* 178*   -CBGs elevated again likely due to prednisone, increased Semglee to 29 units   Hypothyroidism -Continue Synthroid 300 MCG daily (outpatient dose)  Obesity Estimated body mass index is 38.19 kg/m as calculated from the following:   Height as of this encounter: 6\' 1"  (1.854 m).   Weight as of this encounter: 131.3 kg. Appears to be deconditioned, PT on 11/18 recommended home health PT  Code Status: Full CODE STATUS DVT Prophylaxis:  Place TED hose Start: 06/14/21 0958 apixaban (ELIQUIS) tablet 5 mg   Level of Care: Level of care: Telemetry Cardiac Family Communication: Discussed all imaging results, lab results, explained to the patient    Disposition Plan:     Status is:  Inpatient  Remains inpatient appropriate because: Multiple medical issues, volume overload, Cellulitis/gout   Time Spent in minutes 25 minutes  Procedures:  TEE/DCCV  Consultants:   Cardiology  Antimicrobials:   Anti-infectives (From admission, onward)    Start     Dose/Rate Route Frequency Ordered Stop   06/21/21 1245  cephALEXin (KEFLEX) capsule 500 mg        500 mg Oral Every 12 hours 06/21/21 1228 06/26/21 0959          Medications  Scheduled Meds:  amiodarone  400 mg Oral Daily   apixaban  5 mg Oral BID   cephALEXin  500 mg Oral Q12H   docusate sodium  100 mg Oral BID   insulin aspart  0-15 Units Subcutaneous TID WC   insulin aspart  0-5 Units Subcutaneous QHS   insulin glargine-yfgn  26 Units Subcutaneous Q2200   isosorbide mononitrate  30 mg Oral Daily   levothyroxine  300 mcg Oral QAC breakfast   loratadine  5 mg Oral Daily   mometasone-formoterol  2 puff Inhalation BID   nutrition supplement (JUVEN)  1 packet Oral BID BM   predniSONE  40 mg Oral QAC breakfast   simvastatin  5 mg Oral Daily   sodium chloride flush  3 mL Intravenous Q12H   sodium chloride flush  3 mL Intravenous Q12H   torsemide  60 mg Oral BID   Continuous Infusions: PRN Meds:.acetaminophen **OR** acetaminophen, albuterol, alum & mag hydroxide-simeth, bisacodyl, hydrALAZINE, loperamide, morphine injection, ondansetron **OR** ondansetron (ZOFRAN) IV, oxyCODONE, polyethylene glycol, sodium chloride, zolpidem      Subjective:   Dyer Klug was seen and examined today.  Sitting up in the chair, worried about  swelling in the lower legs.  No chest pain, acute shortness of breath, dizziness, lightheadedness.    Objective:   Vitals:   06/22/21 0500 06/22/21 0758 06/22/21 0818 06/22/21 1144  BP: 132/70 128/71  139/81  Pulse: 61 (!) 56  65  Resp: 17 16  15   Temp: (!) 97.5 F (36.4 C) (!) 97.5 F (36.4 C)  97.6 F (36.4 C)  TempSrc: Oral Oral  Oral  SpO2: 96% 94% 100% 99%  Weight:  131.3 kg     Height:        Intake/Output Summary (Last 24 hours) at 06/22/2021 1322 Last data filed at 06/22/2021 1200 Gross per 24 hour  Intake 680 ml  Output 2450 ml  Net -1770 ml     Wt Readings from Last 3 Encounters:  06/22/21 131.3 kg  06/04/21 (!) 138.5 kg  03/07/21 136.1 kg   Physical Exam General: Alert and oriented x 3, NAD Cardiovascular: S1 S2 clear, RRR. Respiratory:  Diminished breath sounds at the bases Gastrointestinal: Soft, nontender, nondistended, NBS Ext: 2+ pedal edema bilaterally, UNNA+ Skin: Erythema left leg/thigh    Data Reviewed:  I have personally reviewed following labs and imaging studies  Micro Results No results found for this or any previous visit (from the past 240 hour(s)).   Radiology Reports DG Chest 1 View  Result Date: 06/20/2021 CLINICAL DATA:  Shortness of breath. EXAM: CHEST  1 VIEW COMPARISON:  06/11/2021 FINDINGS: Heart size remains stable. Decreased interstitial infiltrates are seen since previous study, consistent with decreased interstitial edema. Previously seen small bilateral pleural effusions have also resolved. IMPRESSION: Decreased pulmonary interstitial edema and pleural effusions, consistent with resolving congestive heart failure. Electronically Signed   By: Marlaine Hind M.D.   On: 06/20/2021 14:18   NM Pulmonary Perfusion  Result Date: 06/20/2021 CLINICAL DATA:  Shortness of breath. EXAM: NUCLEAR MEDICINE PERFUSION LUNG SCAN TECHNIQUE: Perfusion images were obtained in multiple projections after intravenous injection of radiopharmaceutical. Ventilation scans intentionally deferred if perfusion scan and chest x-ray adequate for interpretation during COVID 19 epidemic. RADIOPHARMACEUTICALS:  4.4 mCi Tc-2m MAA IV COMPARISON:  January 20, 2021 FINDINGS: No segmental perfusion abnormalities identified. IMPRESSION: No evidence of pulmonary embolus on this study. Electronically Signed   By: Dorise Bullion III M.D.   On:  06/20/2021 15:30   CARDIAC CATHETERIZATION  Result Date: 06/19/2021 1. Right and left heart filling pressures are mildly elevated. 2. Moderate-severe PAH with PVR 4 WU. 3. Low cardiac index, 1.94. Will continue diuresis with IV Lasix infusion today but hold off on metolazone with rising creatinine and filling pressures not markedly high. PH is likely from OHS/OSA, but will arrange for V/Q scan to rule out chronic PE.   DG Chest Portable 1 View  Result Date: 06/11/2021 CLINICAL DATA:  Shortness of breath. EXAM: PORTABLE CHEST 1 VIEW COMPARISON:  06/04/2021 FINDINGS: 1344 hours. Low volume lordotic film. The cardio pericardial silhouette is enlarged. Diffuse interstitial opacity again noted. Bibasilar atelectasis/infiltrate again noted with tiny bilateral pleural effusions. The visualized bony structures of the thorax show no acute abnormality. Telemetry leads overlie the chest. IMPRESSION: Bibasilar atelectasis/infiltrate with tiny bilateral pleural effusions. Probable component of interstitial edema. Electronically Signed   By: Misty Stanley M.D.   On: 06/11/2021 13:52   DG Chest Port 1 View  Result Date: 06/04/2021 CLINICAL DATA:  Short of breath EXAM: PORTABLE CHEST 1 VIEW COMPARISON:  01/30/2021 FINDINGS: Cardiac enlargement. Pulmonary vascularity with mild interstitial edema. Small bilateral effusions and bibasilar atelectasis. IMPRESSION: Congestive heart failure with mild interstitial edema and small bilateral effusions. Bibasilar atelectasis. Electronically Signed   By: Franchot Gallo M.D.   On: 06/04/2021 18:51   ECHOCARDIOGRAM COMPLETE  Result Date: 06/19/2021    ECHOCARDIOGRAM REPORT   Patient Name:   MARISOL GLAZER Date of Exam: 06/19/2021 Medical Rec #:  [id number]     Height:       73.0 in Accession #:    [id number]    Weight:       292.3 lb Date of Birth:  [birth year]     BSA:          2.528 m Patient Age:    10 years      BP:           102/67 mmHg Patient Gender: M             HR:            47 bpm. Exam Location:  Inpatient  Procedure: 2D Echo, Cardiac Doppler, Color Doppler and Intracardiac            Opacification Agent Indications:    CHF-Acute Diastolic Y77.41  History:        Patient has prior history of Echocardiogram examinations, most                 recent 12/28/2020. CAD and Previous Myocardial Infarction,                 Arrythmias:LBBB and Atrial Fibrillation; Risk                 Factors:Dyslipidemia, Hypertension, Sleep Apnea and Diabetes.                 Thyroid disease.  Sonographer:    Darlina Sicilian RDCS Referring Phys: [place]  1. Left ventricular ejection fraction, by estimation, is 45 to 50%. The left ventricle has mildly decreased function. The left ventricle demonstrates global hypokinesis. The left ventricular internal cavity size was severely dilated. Left ventricular diastolic function could not be evaluated.  2. Right ventricular systolic function is moderately reduced. The right ventricular size is not well visualized. There is moderately elevated pulmonary artery systolic pressure.  3. Left atrial size was moderately dilated.  4. The mitral valve is grossly normal. Trivial mitral valve regurgitation. No evidence of mitral stenosis.  5. The aortic valve is grossly normal. There is mild calcification of the aortic valve. Aortic valve regurgitation is trivial. Aortic valve sclerosis is present, with no evidence of aortic valve stenosis.  6. Aortic dilatation noted. There is borderline dilatation of the ascending aorta, measuring 39 mm.  7. The inferior vena cava is dilated in size with <50% respiratory variability, suggesting right atrial pressure of 15 mmHg. Comparison(s): Changes from prior study are noted. EF slightly decreased (globally) compared to prior. Conclusion(s)/Recommendation(s): Significant bradycardia, down to 40 bpm, noted during study. FINDINGS  Left Ventricle: Left ventricular ejection fraction, by estimation, is 45 to 50%. The left  ventricle has mildly decreased function. The left ventricle demonstrates global hypokinesis. Definity contrast agent was given IV to delineate the left ventricular  endocardial borders. The left ventricular internal cavity size was severely dilated. There is no left ventricular hypertrophy. Left ventricular diastolic function could not be evaluated due to atrial fibrillation. Left ventricular diastolic function could not be evaluated. Right Ventricle: The right ventricular size is not well visualized. Right vetricular wall thickness was not well visualized. Right ventricular systolic function is moderately reduced. There is moderately elevated pulmonary artery systolic pressure. The tricuspid regurgitant velocity is 3.07 m/s, and with an assumed right atrial pressure of 15 mmHg, the estimated right ventricular systolic pressure is 28.7 mmHg. Left Atrium: Left atrial size was moderately dilated. Right Atrium: Right atrial size was not well visualized. Pericardium: There is no evidence of pericardial effusion. Mitral Valve: The mitral valve is grossly normal. Trivial mitral valve regurgitation. No evidence of mitral valve stenosis. Tricuspid Valve: The tricuspid valve is grossly normal. Tricuspid valve regurgitation is trivial. No evidence of tricuspid stenosis. Aortic Valve: The aortic valve is grossly normal. There is mild calcification of the aortic valve. Aortic valve regurgitation is trivial. Aortic valve sclerosis is present, with no evidence of aortic valve stenosis. Pulmonic Valve: The pulmonic valve was not well visualized. Pulmonic valve regurgitation is trivial. No evidence of pulmonic stenosis. Aorta: Aortic dilatation noted. There is borderline dilatation of the ascending aorta, measuring 39 mm. Venous: The inferior vena cava  is dilated in size with less than 50% respiratory variability, suggesting right atrial pressure of 15 mmHg. IAS/Shunts: The interatrial septum was not well visualized.  LEFT  VENTRICLE PLAX 2D LVIDd:         6.40 cm LVIDs:         4.90 cm LV PW:         1.00 cm LV IVS:        1.10 cm LVOT diam:     2.30 cm LV SV:         99 LV SV Index:   39 LVOT Area:     4.15 cm  LV Volumes (MOD) LV vol d, MOD A2C: 162.0 ml LV vol d, MOD A4C: 183.0 ml LV vol s, MOD A2C: 95.1 ml LV vol s, MOD A4C: 84.9 ml LV SV MOD A2C:     66.9 ml LV SV MOD A4C:     183.0 ml LV SV MOD BP:      83.6 ml RIGHT VENTRICLE RV S prime:     6.98 cm/s TAPSE (M-mode): 1.3 cm LEFT ATRIUM              Index LA diam:        4.90 cm  1.94 cm/m LA Vol (A2C):   68.8 ml  27.21 ml/m LA Vol (A4C):   104.0 ml 41.13 ml/m LA Biplane Vol: 90.7 ml  35.87 ml/m  AORTIC VALVE LVOT Vmax:   124.00 cm/s LVOT Vmean:  96.900 cm/s LVOT VTI:    0.238 m  AORTA Ao Root diam: 2.70 cm Ao Asc diam:  3.90 cm MITRAL VALVE               TRICUSPID VALVE MV Area (PHT): 3.61 cm    TR Peak grad:   37.7 mmHg MV Decel Time: 210 msec    TR Vmax:        307.00 cm/s MV E velocity: 92.95 cm/s                            SHUNTS                            Systemic VTI:  0.24 m                            Systemic Diam: 2.30 cm Buford Dresser MD Electronically signed by Buford Dresser MD Signature Date/Time: 06/19/2021/4:34:56 PM    Final    CT EXTREMITY LOWER LEFT WO CONTRAST  Result Date: 06/20/2021 CLINICAL DATA:  Left lower leg pain. EXAM: CT OF THE LOWER LEFT EXTREMITY WITHOUT CONTRAST TECHNIQUE: Multidetector CT imaging of the lower left extremity was performed according to the standard protocol. COMPARISON:  Left knee x-rays dated March 09, 2011. FINDINGS: Bones/Joint/Cartilage Prior left total knee arthroplasty. No evidence of hardware failure or loosening. No fracture or dislocation. Joint spaces are preserved. Small knee joint effusion. Ligaments Ligaments are suboptimally evaluated by CT. Muscles and Tendons Scattered lower leg muscle atrophy, most prominently involving the medial gastrocnemius muscle. Soft tissue Diffuse circumferential  soft tissue swelling of the left lower leg. No fluid collection or subcutaneous emphysema. No soft tissue mass. IMPRESSION: 1. Diffuse circumferential soft tissue swelling of the left lower leg, nonspecific, but can be seen with cellulitis. No abscess. 2. Prior left total knee arthroplasty without evidence of hardware complication.  Electronically Signed   By: Titus Dubin M.D.   On: 06/20/2021 15:09    Lab Data:  CBC: Recent Labs  Lab 06/19/21 1238  HGB 14.3  14.3  HCT 42.0  15.0   Basic Metabolic Panel: Recent Labs  Lab 06/18/21 0846 06/19/21 0326 06/19/21 1238 06/20/21 0101 06/21/21 0048 06/22/21 0044  NA 139 136 139  139 138 138 136  K 3.8 3.3* 3.4*  3.5 3.4* 3.6 4.2  CL 97* 95*  --  96* 94* 95*  CO2 32 31  --  32 31 31  GLUCOSE 107* 90  --  118* 108* 165*  BUN 78* 89*  --  102* 102* 109*  CREATININE 2.70* 3.07*  --  3.32* 3.29* 3.07*  CALCIUM 8.9 9.0  --  9.2 9.0 8.6*  MG  --   --   --  2.4  --   --    GFR: Estimated Creatinine Clearance: 30 mL/min (A) (by C-G formula based on SCr of 3.07 mg/dL (H)). Liver Function Tests: No results for input(s): AST, ALT, ALKPHOS, BILITOT, PROT, ALBUMIN in the last 168 hours. No results for input(s): LIPASE, AMYLASE in the last 168 hours. No results for input(s): AMMONIA in the last 168 hours. Coagulation Profile: No results for input(s): INR, PROTIME in the last 168 hours. Cardiac Enzymes: No results for input(s): CKTOTAL, CKMB, CKMBINDEX, TROPONINI in the last 168 hours. BNP (last 3 results) No results for input(s): PROBNP in the last 8760 hours. HbA1C: No results for input(s): HGBA1C in the last 72 hours. CBG: Recent Labs  Lab 06/21/21 1131 06/21/21 1605 06/21/21 2125 06/22/21 0639 06/22/21 1145  GLUCAP 95 119* 216* 235* 178*   Lipid Profile: No results for input(s): CHOL, HDL, LDLCALC, TRIG, CHOLHDL, LDLDIRECT in the last 72 hours. Thyroid Function Tests: No results for input(s): TSH, T4TOTAL, FREET4, T3FREE,  THYROIDAB in the last 72 hours. Anemia Panel: No results for input(s): VITAMINB12, FOLATE, FERRITIN, TIBC, IRON, RETICCTPCT in the last 72 hours. Urine analysis:    Component Value Date/Time   COLORURINE YELLOW 11/19/2020 1736   APPEARANCEUR CLEAR 11/19/2020 1736   LABSPEC 1.015 11/19/2020 1736   PHURINE 6.0 11/19/2020 1736   GLUCOSEU NEGATIVE 11/19/2020 1736   HGBUR NEGATIVE 11/19/2020 1736   BILIRUBINUR NEGATIVE 11/19/2020 1736   KETONESUR NEGATIVE 11/19/2020 1736   PROTEINUR >=300 (A) 11/19/2020 1736   UROBILINOGEN 4.0 (H) 07/23/2014 1356   NITRITE NEGATIVE 11/19/2020 1736   LEUKOCYTESUR NEGATIVE 11/19/2020 1736     Ruther Ephraim M.D. [facility] 06/22/2021, 1:22 PM  Available via Epic secure chat 7am-7pm After 7 pm, please refer to night coverage provider listed on amion.

## 2021-06-23 ENCOUNTER — Encounter (HOSPITAL_COMMUNITY): Payer: Self-pay | Admitting: Cardiology

## 2021-06-23 DIAGNOSIS — I5033 Acute on chronic diastolic (congestive) heart failure: Secondary | ICD-10-CM | POA: Diagnosis not present

## 2021-06-23 LAB — CBC
HCT: 41.6 % (ref 39.0–52.0)
Hemoglobin: 13 g/dL (ref 13.0–17.0)
MCH: 27.6 pg (ref 26.0–34.0)
MCHC: 31.3 g/dL (ref 30.0–36.0)
MCV: 88.3 fL (ref 80.0–100.0)
Platelets: 108 10*3/uL — ABNORMAL LOW (ref 150–400)
RBC: 4.71 MIL/uL (ref 4.22–5.81)
RDW: 15.5 % (ref 11.5–15.5)
WBC: 6.1 10*3/uL (ref 4.0–10.5)
nRBC: 0 % (ref 0.0–0.2)

## 2021-06-23 LAB — BASIC METABOLIC PANEL
Anion gap: 12 (ref 5–15)
BUN: 114 mg/dL — ABNORMAL HIGH (ref 8–23)
CO2: 29 mmol/L (ref 22–32)
Calcium: 8.7 mg/dL — ABNORMAL LOW (ref 8.9–10.3)
Chloride: 93 mmol/L — ABNORMAL LOW (ref 98–111)
Creatinine, Ser: 3.07 mg/dL — ABNORMAL HIGH (ref 0.61–1.24)
GFR, Estimated: 21 mL/min — ABNORMAL LOW (ref >60)
Glucose, Bld: 130 mg/dL — ABNORMAL HIGH (ref 70–99)
Potassium: 3.9 mmol/L (ref 3.5–5.1)
Sodium: 134 mmol/L — ABNORMAL LOW (ref 135–145)

## 2021-06-23 LAB — GLUCOSE, CAPILLARY
Glucose-Capillary: 107 mg/dL — ABNORMAL HIGH (ref 70–99)
Glucose-Capillary: 105 mg/dL — ABNORMAL HIGH (ref 70–99)
Glucose-Capillary: 121 mg/dL — ABNORMAL HIGH (ref 70–99)
Glucose-Capillary: 95 mg/dL (ref 70–99)
Glucose-Capillary: 99 mg/dL (ref 70–99)

## 2021-06-23 MED ORDER — METOLAZONE 2.5 MG PO TABS: 5.0000 mg | ORAL_TABLET | Freq: Once | ORAL | Status: DC

## 2021-06-23 MED ORDER — POTASSIUM CHLORIDE CRYS ER 20 MEQ PO TBCR
40.0000 meq | EXTENDED_RELEASE_TABLET | Freq: Once | ORAL | Status: AC
  Administered 2021-06-23: 40 meq via ORAL
  Filled 2021-06-23: qty 2

## 2021-06-23 MED ORDER — TORSEMIDE 20 MG PO TABS
80.0000 mg | ORAL_TABLET | Freq: Two times a day (BID) | ORAL | Status: DC
  Administered 2021-06-23 – 2021-06-24 (×4): 80 mg via ORAL
  Filled 2021-06-23 (×4): qty 4

## 2021-06-23 MED ORDER — TEMAZEPAM 7.5 MG PO CAPS
7.5000 mg | ORAL_CAPSULE | Freq: Every evening | ORAL | Status: DC | PRN
  Administered 2021-06-23 – 2021-06-25 (×3): 7.5 mg via ORAL
  Filled 2021-06-23 (×3): qty 1

## 2021-06-23 MED ORDER — INSULIN ASPART 100 UNIT/ML IJ SOLN
3.0000 [IU] | Freq: Three times a day (TID) | INTRAMUSCULAR | Status: DC
  Administered 2021-06-23 – 2021-06-26 (×8): 3 [IU] via SUBCUTANEOUS

## 2021-06-23 MED ORDER — INSULIN GLARGINE-YFGN 100 UNIT/ML ~~LOC~~ SOLN
33.0000 [IU] | Freq: Every day | SUBCUTANEOUS | Status: DC
  Administered 2021-06-23 – 2021-06-25 (×3): 33 [IU] via SUBCUTANEOUS
  Filled 2021-06-23 (×4): qty 0.33

## 2021-06-23 NOTE — Progress Notes (Signed)
Pt refused CPAP for tonight. RT instructed pt to have RT called if he changes his mind. RT will monitor as needed.

## 2021-06-23 NOTE — Progress Notes (Signed)
Mobility Specialist Progress Note    06/23/21 1522  Mobility  Activity Ambulated in hall  Level of Assistance Modified independent, requires aide device or extra time  Assistive Device Four wheel walker  Distance Ambulated (ft) 200 ft  Mobility Ambulated independently in hallway  Mobility Response Tolerated well  Mobility performed by Mobility specialist  Bed Position Chair  $Mobility charge 1 Mobility   Pt received in chair and agreeable. Ambulated on 4LO2. Took x1 seated break. Returned to chair with call bell in reach. Pt expressed interest in a shower chair.   [facility] Mobility Specialist  M.S. Primary Phone: [phone] M.S. Secondary Phone: [phone]

## 2021-06-23 NOTE — Plan of Care (Signed)

## 2021-06-23 NOTE — Progress Notes (Addendum)
Occupational Therapy Treatment & Discharge Patient Details Name: [name] [name] MRN: [id number] DOB: [birth year] Today's Date: 06/23/2021   History of present illness Pt is a 75 yo male presenting 06/11/21 with LE edema and SOB; workup for CHF exacerbation. PMH includes: afib, CAD, CKD III, HTN, HLD, LBBB, NSTEMI (2018), DM II, sleep apnea (sleeps in recliner), morbid obesity, and neuropathy. Uses 3-4L O2 at baseline.   OT comments  Session focused on assessment of UE HEP carryover and ADL mobility. Pt able to recall 2/4 UE exercises - provided handout for improved carryover. Provided higher level resistance band to maximize strength/endurance with continued exercises at home. Pt able to complete hallway mobility using RW without physical assistance; encouraged bathroom mobility prn. Collaborated with pt on progression of activity tolerance at home, energy conservation strategies (pt with good insight) and progression of LB strength with HHPT. Pt has met acute OT goals with no further skilled services needed and verbalized understanding of education. OT to sign off.   Spo2 briefly to 87% on 4 L O2 after mobility, quickly rebounds to 94% with seated rest break.    Recommendations for follow up therapy are one component of a multi-disciplinary discharge planning process, led by the attending physician.  Recommendations may be updated based on patient status, additional functional criteria and insurance authorization.    Follow Up Recommendations  No OT follow up    Assistance Recommended at Discharge Set up Supervision/Assistance  Equipment Recommendations  Tub/shower seat (current seat is worn per pt)    Recommendations for Other Services      Precautions / Restrictions Precautions Precautions: Fall Precaution Comments: monitor O2 (wears 3-4 L O2 at baseline) Restrictions Weight Bearing Restrictions: No       Mobility Bed Mobility               General bed mobility comments: pt  received sitting in chair    Transfers Overall transfer level: Modified independent Equipment used: Rolling walker (2 wheels);None Transfers: Sit to/from Stand Sit to Stand: Modified independent (Device/Increase time)                 Balance Overall balance assessment: Needs assistance Sitting-balance support: No upper extremity supported;Feet supported Sitting balance-Leahy Scale: Good     Standing balance support: Bilateral upper extremity supported Standing balance-Leahy Scale: Poor Standing balance comment: benefits from UE support of DME                           ADL either performed or assessed with clinical judgement   ADL Overall ADL's : Needs assistance/impaired                     Lower Body Dressing: Minimal assistance;Sit to/from stand Lower Body Dressing Details (indicate cue type and reason): to don slip on slippers with heel, reports typically using shoe horn, sock aid for socks at home             Functional mobility during ADLs: Rolling walker (2 wheels);Modified independent General ADL Comments: able to mobilize well in hallway, more than enough distance for bathroom mobility at home with improved breathing. Pt able to recall 2/4 exercises, provided handout for improved carryover with pt able to return demo well    Extremity/Trunk Assessment Upper Extremity Assessment Upper Extremity Assessment: Overall WFL for tasks assessed   Lower Extremity Assessment Lower Extremity Assessment: Defer to PT evaluation  Vision   Vision Assessment?: No apparent visual deficits   Perception     Praxis      Cognition Arousal/Alertness: Awake/alert Behavior During Therapy: WFL for tasks assessed/performed Overall Cognitive Status: Within Functional Limits for tasks assessed                                            Exercises Exercises: General Upper Extremity General Exercises - Upper Extremity Shoulder  Flexion: AROM;Both;20 reps;Seated;Theraband Theraband Level (Shoulder Flexion): Level 2 (Red) Shoulder Extension: AROM;Both;20 reps;Seated;Theraband Theraband Level (Shoulder Extension): Level 2 (Red) Shoulder Horizontal ABduction: Strengthening;Both;20 reps;Seated;Theraband Theraband Level (Shoulder Horizontal Abduction): Level 2 (Red) Elbow Flexion: AROM;Both;20 reps;Seated;Theraband Theraband Level (Elbow Flexion): Level 2 (Red) Elbow Extension: AROM;Both;20 reps;Seated;Theraband Theraband Level (Elbow Extension): Level 2 (Red)   Shoulder Instructions       General Comments SpO2 briefly to 87% immediately following hallway mobility, rebounds to 94% with seated rest break on 4 L O2    Pertinent Vitals/ Pain       Pain Assessment: No/denies pain Pain Intervention(s): Monitored during session  Home Living                                          Prior Functioning/Environment              Frequency  Min 2X/week        Progress Toward Goals  OT Goals(current goals can now be found in the care plan section)  Progress towards OT goals: Goals met/education completed, patient discharged from OT  Acute Rehab OT Goals Patient Stated Goal: continue walking with RW at home, improve LB strength OT Goal Formulation: All assessment and education complete, DC therapy Time For Goal Achievement: 06/26/21 Potential to Achieve Goals: Good ADL Goals Pt Will Transfer to Toilet: with modified independence;ambulating Pt/caregiver will Perform Home Exercise Program: Increased strength;Both right and left upper extremity;With theraband;Independently;With written HEP provided  Plan All goals met and education completed, patient discharged from OT services    Co-evaluation                 AM-PAC OT "6 Clicks" Daily Activity     Outcome Measure   Help from another person eating meals?: None Help from another person taking care of personal grooming?: A  Little Help from another person toileting, which includes using toliet, bedpan, or urinal?: A Little Help from another person bathing (including washing, rinsing, drying)?: A Little Help from another person to put on and taking off regular upper body clothing?: A Little Help from another person to put on and taking off regular lower body clothing?: A Little 6 Click Score: 19    End of Session Equipment Utilized During Treatment: Rolling walker (2 wheels);Gait belt;Oxygen  OT Visit Diagnosis: Other (comment);History of falling (Z91.81);Other abnormalities of gait and mobility (R26.89)   Activity Tolerance Patient tolerated treatment well   Patient Left in chair;with call bell/phone within reach   Nurse Communication          Time: 3559-7416 OT Time Calculation (min): 25 min  Charges: OT General Charges $OT Visit: 1 Visit OT Treatments $Self Care/Home Management : 8-22 mins $Therapeutic Activity: 8-22 mins  Malachy Chamber, OTR/L Acute Rehab Services Office: [phone]   Layla Maw 06/23/2021, 1:41 PM

## 2021-06-23 NOTE — Progress Notes (Addendum)
Patient ID: [name] [name], male   DOB: July 23, 1946, 75 y.o.   MRN: [id number]     Advanced Heart Failure Rounding Note  PCP-Cardiologist: Rozann Lesches, MD   Subjective:     Underwent TEE/DC-CV 11/18. EF 45-50%. Remains in NSR  On Torsemide 60 mg BID. Given 2.5 mg metolazone 11/20. Weight unchanged. -800 cc yesterday.   Scr starting to improve  2.5 > 2.7 > 3.1 > 3.3 -> 3.3 -> 3.07 -> 3.07  No dyspnea. Reports urine output has slowed.  Left leg pain improving.   Eager to walk around.   RHC 11/17: RA mean 10 RV 67/10 PA 69/22 PCWP mean 18 Oxygen saturations: PA 63% AO 99% Cardiac Output (Fick) 4.91  Cardiac Index (Fick) 1.94 PVR 4.1 WU PAPI 5.7  Objective:   Weight Range: 131.4 kg Body mass index is 38.22 kg/m.   Vital Signs:   Temp:  [97.5 F (36.4 C)-98.3 F (36.8 C)] 98.3 F (36.8 C) (11/21 0334) Pulse Rate:  [54-65] 56 (11/21 0334) Resp:  [14-20] 19 (11/21 0334) BP: (121-139)/(61-81) 136/72 (11/21 0334) SpO2:  [94 %-100 %] 98 % (11/21 0334) Weight:  [131.4 kg] 131.4 kg (11/21 0334) Last BM Date: 06/21/21  Weight change: Filed Weights   06/21/21 0300 06/22/21 0500 06/23/21 0334  Weight: 130 kg 131.3 kg 131.4 kg    Intake/Output:   Intake/Output Summary (Last 24 hours) at 06/23/2021 0747 Last data filed at 06/22/2021 2100 Gross per 24 hour  Intake 720 ml  Output 1500 ml  Net -780 ml      Physical Exam    General:  Sitting up on side of bed. No distress.  HEENT: normal Neck: + JVP to jaw. Carotids 2+ bilat; no bruits. Cor: PMI nondisplaced. Regular rate & rhythm. No rubs, gallops or murmurs. Lungs: clear Abdomen: soft, obese, nontender, nondistended.  Extremities: no cyanosis, clubbing, rash, 2+ edema, UNNA boots on Neuro: alert & orientedx3, cranial nerves grossly intact. moves all 4 extremities w/o difficulty. Affect pleasant    Telemetry   NSR 50s + ~ 5 PVCs/min   Labs    CBC No results for input(s): WBC, NEUTROABS, HGB,  HCT, MCV, PLT in the last 72 hours. Basic Metabolic Panel Recent Labs    06/22/21 0044 06/23/21 0018  NA 136 134*  K 4.2 3.9  CL 95* 93*  CO2 31 29  GLUCOSE 165* 130*  BUN 109* 114*  CREATININE 3.07* 3.07*  CALCIUM 8.6* 8.7*   Liver Function Tests No results for input(s): AST, ALT, ALKPHOS, BILITOT, PROT, ALBUMIN in the last 72 hours. No results for input(s): LIPASE, AMYLASE in the last 72 hours. Cardiac Enzymes No results for input(s): CKTOTAL, CKMB, CKMBINDEX, TROPONINI in the last 72 hours.  BNP: BNP (last 3 results) Recent Labs    01/30/21 0312 06/04/21 1820 06/11/21 1338  BNP 267.0* 446.0* 199.1*    ProBNP (last 3 results) No results for input(s): PROBNP in the last 8760 hours.   D-Dimer No results for input(s): DDIMER in the last 72 hours. Hemoglobin A1C No results for input(s): HGBA1C in the last 72 hours. Fasting Lipid Panel No results for input(s): CHOL, HDL, LDLCALC, TRIG, CHOLHDL, LDLDIRECT in the last 72 hours. Thyroid Function Tests No results for input(s): TSH, T4TOTAL, T3FREE, THYROIDAB in the last 72 hours.  Invalid input(s): FREET3  Other results:   Imaging    No results found.   Medications:     Scheduled Medications:  amiodarone  400 mg Oral Daily  apixaban  5 mg Oral BID   cephALEXin  500 mg Oral Q12H   docusate sodium  100 mg Oral BID   insulin aspart  0-15 Units Subcutaneous TID WC   insulin aspart  0-5 Units Subcutaneous QHS   insulin glargine-yfgn  29 Units Subcutaneous Q2200   isosorbide mononitrate  30 mg Oral Daily   levothyroxine  300 mcg Oral QAC breakfast   loratadine  5 mg Oral Daily   mometasone-formoterol  2 puff Inhalation BID   nutrition supplement (JUVEN)  1 packet Oral BID BM   simvastatin  5 mg Oral Daily   sodium chloride flush  3 mL Intravenous Q12H   sodium chloride flush  3 mL Intravenous Q12H   torsemide  60 mg Oral BID    Infusions:    PRN Medications: acetaminophen **OR** acetaminophen,  albuterol, alum & mag hydroxide-simeth, bisacodyl, chlorproMAZINE, hydrALAZINE, loperamide, morphine injection, ondansetron **OR** ondansetron (ZOFRAN) IV, oxyCODONE, polyethylene glycol, sodium chloride    Assessment/Plan   1. Acute on chronic diastolic CHF:  -In setting of CKD stage IV and persistent atrial fibrillation.   -Echo in 5/22 with EF 55-60%, mild RV enlargement, normal RV systolic function.  Admitted with marked volume overload.  -Echo this admit with EF 45-50%, LV severely dilated, RV moderately reduced, RVSP 53 mmHg -RHC 11/17 - RA mean, PCWP mean 18, moderate to severe PAH with PVR 4 WU, CI low at 1.94 - TEE 11/18 EF 45-50% - Lasix gtt stopped 11/18. Down 17 lb overall. Scr plateauing. BUN remains high 102 -> 109 -> 114. On Torsemide 60 mg BID. Also received 2.5 mg metolazone yesterday. No change in weight overnight. Still appears volume up. Will increase Torsemide to 80 mg BID and give 5 mg metolazone today.  -May be able to eventually start Jardiance, follow GFR.  (GFR 21 today) 2. Pulmonary hypertension: -Moderate to severe PAH with PVR 4 WU on RHC 06/19/21 -Likely due to OHS/OSA -No evidence of PE on V/Q scan 3. Atrial fibrillation: Persistent since 12/21, DCCV never attempted.  Suspect he will not stay in NSR without anti-arrhythmic. S/p DC-CV on 11/18. Remains in NSR this am  - Bisoprolol stopped with bradycardia.  - Continue amio 400 mg daily.  - Continue apixaban 5 mg bid.  4. CAD: S/p NSTEMI with occluded OM1 in 6/18, had DES to 80% stenosis mLAD at that time.  No s/s angina  - No coronary angiography in absence of ACS given CKD stage 4.  - Continue statin.  - No ASA given Eliquis use.  5. AKI on CKD: Stage IV.  - Baseline Scr 2.6-2.7 - Scr slightly improved 3.3 -> 3.07 -> 3.07 today. BUN 102 -> 109 ->114. See above regarding diuretics.  - Likely nearing point that he may need HD  6. Type 2 DM: Continue insulin.  7. Chronic hypoxemic respiratory failure: He is  on 2L home oxygen, O2 sats stable on 3-4L here.  ?OHS/OSA, ?COPD.  He smoked in the past but not heavily.  - Would get PFTs after diuresis.  - Needs home sleep study.  8. Hypokalemia: - K 3.9 today.  - Supp  9. Left lower extremity pain: - CT suggestive of diffuse tissue swelling ? cellulitis - Uric acid 13.1 concerning for acute gout. Started on prednisone 40 mg X 3 days. No colchicine with CKD IV.  - management per TRH. Cephalexin started 11/19. AF.   Mobilize CR consult  Disposition: -Will need HHPT at discharge   Length of  Stay: [address], [place], PA-C  06/23/2021, 7:47 AM  Advanced Heart Failure Team Pager [phone] (M-F; 7a - 5p)  Please contact [facility] for night-coverage after hours (5p -7a ) and weekends on [email or web address]   Patient seen with PA, agree with the above note.   Weight stable.  Creatinine stable today.  No dyspnea.  Left knee is feeling better, on prednisone for gout and Keflex for ?cellulitis.   He remains in NSR.   General: NAD Neck: JVP 8-9 cm, no thyromegaly or thyroid nodule.  Lungs: Clear to auscultation bilaterally with normal respiratory effort. CV: Nondisplaced PMI.  Heart regular S1/S2, no S3/S4, no murmur.  Trace ankle edema.  Abdomen: Soft, nontender, no hepatosplenomegaly, no distention.  Skin: Intact without lesions or rashes.  Neurologic: Alert and oriented x 3.  Psych: Normal affect. Extremities: No clubbing or cyanosis.  HEENT: Normal.   Stable creatinine, probably mild volume overload.  Agree with increase torsemide to 80 mg bid but would hold off on metolazone.   Continue current amiodarone for now, decrease to 200 mg bid at discharge.   Needs to walk today.  If he is mobile, should be able to go home soon.   [name] 06/23/2021 12:13 PM

## 2021-06-23 NOTE — Progress Notes (Signed)
PT Cancellation Note  Patient Details Name: [name] [name] MRN: [id number] DOB: July 04, 1946   Cancelled Treatment:    Reason Eval/Treat Not Completed: Other (comment) New PT consult placed this morning. The pt is already on PT caseload at this time, will continue with current plan of care as no new change in status at time of new PT order.   Of note, the pt is also being followed by mobility team and is safe to mobilize with staff as he is mobilizing on modI level with use of 4-wheel walker for distances up to 50 ft at a time.   West Carbo, PT, DPT   Acute Rehabilitation Department Pager #: [phone]   Sandra Cockayne 06/23/2021, 11:05 AM

## 2021-06-23 NOTE — Progress Notes (Signed)
Physical Therapy Treatment Patient Details Name: [name] [name] MRN: [id number] DOB: [birth year] Today's Date: 06/23/2021   History of Present Illness Pt is a 75 yo male presenting 06/11/21 with LE edema and SOB; workup for CHF exacerbation. PMH includes: afib, CAD, CKD III, HTN, HLD, LBBB, NSTEMI (2018), DM II, sleep apnea (sleeps in recliner), morbid obesity, and neuropathy. Uses 3-4L O2 at baseline.    PT Comments    The pt remains eager to mobilize and was able to complete x3 bouts of 50-65ft hallway ambulation with use of rollator and 4L O2 this morning. He continues to benefit from cues for posture, positioning near walker, and for self-monitoring for exertion and need for seated rest. Despite maintaining SpO2 in 90s with gait, it does drop to low of 85% on 4L after ambulation requiring boost of 6L during 1 min of recovery. Will continue to benefit from skilled PT acutely to progress activity tolerance and dynamic stability.     Recommendations for follow up therapy are one component of a multi-disciplinary discharge planning process, led by the attending physician.  Recommendations may be updated based on patient status, additional functional criteria and insurance authorization.  Follow Up Recommendations  Home health PT     Assistance Recommended at Discharge Intermittent Supervision/Assistance  Equipment Recommendations  None recommended by PT    Recommendations for Other Services       Precautions / Restrictions Precautions Precautions: Fall Precaution Comments: 4L SpO2 for mobility Restrictions Weight Bearing Restrictions: No     Mobility  Bed Mobility               General bed mobility comments: pt received sitting in chair    Transfers Overall transfer level: Modified independent Equipment used: Rolling walker (2 wheels);None Transfers: Sit to/from Stand Sit to Stand: Modified independent (Device/Increase time)     Step pivot transfers: Min guard      General transfer comment: mingG with max cues for hand positioning. modA x1 due to pt preemptively sitting and needing assist to steady. VC each rep for hand positioning and use of RW    Ambulation/Gait Ambulation/Gait assistance: Min guard Gait Distance (Feet): 55 Feet (+ 55 ft + 50 ft) Assistive device: Rolling walker (2 wheels) Gait Pattern/deviations: Step-through pattern;Wide base of support Gait velocity: reduced Gait velocity interpretation: <1.31 ft/sec, indicative of household ambulator   General Gait Details: slowed gait, RW far from his BOS despite cues for upright posture and proximity to RW. VC for slowed speed and self-monitoring of exertion      Balance Overall balance assessment: Needs assistance Sitting-balance support: No upper extremity supported;Feet supported Sitting balance-Leahy Scale: Good Sitting balance - Comments: pt able to maintain without UE support or back support   Standing balance support: Bilateral upper extremity supported Standing balance-Leahy Scale: Poor Standing balance comment: at least single UE support for static stance                            Cognition Arousal/Alertness: Awake/alert Behavior During Therapy: WFL for tasks assessed/performed Overall Cognitive Status: Within Functional Limits for tasks assessed                                 General Comments: pt following all instructions, slightly decreased insight to safety        Exercises General Exercises - Upper Extremity Shoulder Flexion: AROM;Both;20 reps;Seated;Theraband Theraband  Level (Shoulder Flexion): Level 2 (Red) Shoulder Extension: AROM;Both;20 reps;Seated;Theraband Theraband Level (Shoulder Extension): Level 2 (Red) Shoulder Horizontal ABduction: Strengthening;Both;20 reps;Seated;Theraband Theraband Level (Shoulder Horizontal Abduction): Level 2 (Red) Elbow Flexion: AROM;Both;20 reps;Seated;Theraband Theraband Level (Elbow Flexion):  Level 2 (Red) Elbow Extension: AROM;Both;20 reps;Seated;Theraband Theraband Level (Elbow Extension): Level 2 (Red)    General Comments General comments (skin integrity, edema, etc.): SpO2 to 84% following ambulation on 4L, recovers with seated rest and 6L for 1 min      Pertinent Vitals/Pain Pain Assessment: No/denies pain Pain Intervention(s): Monitored during session     PT Goals (current goals can now be found in the care plan section) Acute Rehab PT Goals Patient Stated Goal: return home PT Goal Formulation: With patient Time For Goal Achievement: 06/26/21 Potential to Achieve Goals: Good Progress towards PT goals: Progressing toward goals    Frequency    Min 3X/week      PT Plan Current plan remains appropriate       AM-PAC PT "6 Clicks" Mobility   Outcome Measure  Help needed turning from your back to your side while in a flat bed without using bedrails?: A Little Help needed moving from lying on your back to sitting on the side of a flat bed without using bedrails?: A Little Help needed moving to and from a bed to a chair (including a wheelchair)?: A Little Help needed standing up from a chair using your arms (e.g., wheelchair or bedside chair)?: A Little Help needed to walk in hospital room?: A Little Help needed climbing 3-5 steps with a railing? : A Little 6 Click Score: 18    End of Session Equipment Utilized During Treatment: Oxygen;Gait belt Activity Tolerance: Patient limited by fatigue Patient left: in chair;with call bell/phone within reach Nurse Communication: Mobility status PT Visit Diagnosis: Other abnormalities of gait and mobility (R26.89);Muscle weakness (generalized) (M62.81);Difficulty in walking, not elsewhere classified (R26.2)     Time: 0712-1975 PT Time Calculation (min) (ACUTE ONLY): 31 min  Charges:  $Gait Training: 23-37 mins                     West Carbo, PT, DPT   Acute Rehabilitation Department Pager #: [phone]   Sandra Cockayne 06/23/2021, 2:26 PM

## 2021-06-23 NOTE — Progress Notes (Addendum)
[facility]                                                                              Patient Demographics  [name] [name], is a 75 y.o. male, DOB - March 07, 1946, NLZ:[id number]  Admit date - 06/11/2021   Admitting Physician [name] Cheney, MD  Outpatient Primary MD for the patient is [name] Major, MD  Outpatient specialists:   LOS - 11  days   Medical records reviewed and are as summarized below:    Chief Complaint  Patient presents with   Shortness of Breath       Brief summary   Patient is a 75 year old male with A. fib, CAD status post stent, CKD stage III, HTN, HLD, COPD, hypothyroidism, morbid obesity, NHL treated with XRT and chemo, DM, OSA presented with shortness of breath.  Currently on O2 3 to 4 L via was feeling shortness of breath.  Patient PND, unchanged from prior, sleeps in a recliner.  Patient's weight was up about 9 lbs on admission.  Patient admits to 3 doses a week of medications.   In ED, he was found to be acute on chronic hypoxic respiratory failure, desatted to 60s on 4 L., was placed on NRB and quickly weaned down to 4 L.  Patient was admitted with acute on chronic diastolic CHF Initially placed on IV Lasix with minimal urine output.  CHF team consulted, subsequently placed on IV Lasix GTT, underwent TEE/DCCV on 11/18   Assessment & Plan    Principal Problem: Acute on chronic respiratory failure with hypoxia secondary to acute on chronic diastolic CHF (congestive heart failure) (HCC) -Outpatient on 3 to 4 L of O2, currently on 4 L. -Minimal output with IV Lasix bolus doses and improved with Lasix infusion and metolazone, currently on hold, transition to oral torsemide.  CHF team following -Negative balance of 16.2 L, still volume overloaded, increased  torsemide to 80 mg twice a day  -Cr 3.0, stable and improving   Active Problems:   Chronic kidney disease Stage IV -Baseline creatinine 2.6-2.8, plateaued at 3.3, IV Lasix  infusion placed on hold -Creatinine improving, 3.0    Morbid obesity ([place]), lower extremity edema, venous stasis/cellulitis L LE, ?  Acute gout -CT left lower extremity with diffuse circumferential soft tissue swelling of left lower leg, nonspecific but can be seen in cellulitis, no abscess -Continue Keflex  -Uric acid 13.1, placed on prednisone 40 mg x3 days, will reassess  Atrial fibrillation, persistent -Underwent TEE/DCCV on 11/18 -Beta-blocker stopped due to bradycardia, continue amiodarone -On Eliquis   Moderate to severe pulmonary hypertension -Moderately severe PAH likely due to COPD, OHS/OSA -VQ scan 11/18 negative for any PE  COPD with possible acute exacerbation -No wheezing, continue Dulera, albuterol as needed  Possible OSA -  Need sleep study as outpatient  HTN -BP stable, continue Imdur, torsemide  Diabetes mellitus type 2, IDDM uncontrolled with hyperglycemia, CKD stage IV -A1c 5.8 in 01/2021 Recent Labs    06/22/21 0639 06/22/21 1145 06/22/21 1605 06/22/21 2102 06/23/21 0634 06/23/21 1033  GLUCAP 235* 178* 155* 156* 105* 121*    -CBGs still elevated, increase  to [facility] to 33 units, added NovoLog meal coverage 3 units 3 times daily AC, continue sliding scale insulin moderate   Hypothyroidism -Continue Synthroid 300 MCG daily (outpatient dose)  Generalized debility -Working with PT, needs home health PT  Obesity Estimated body mass index is 38.22 kg/m as calculated from the following:   Height as of this encounter: 6\' 1"  (1.854 m).   Weight as of this encounter: 131.4 kg. Appears to be deconditioned, PT on 11/18 recommended home health PT  Code Status: Full CODE STATUS DVT Prophylaxis:  Place TED hose Start: 06/14/21 0958 apixaban (ELIQUIS) tablet 5 mg   Level of Care: Level of care: Telemetry Cardiac Family Communication: Discussed all imaging results, lab results, explained to the patient    Disposition Plan:     Status is:  Inpatient  Remains inpatient appropriate because: Multiple medical issues, volume overload, Cellulitis/gout   Time Spent in minutes 25 minutes  Procedures:  TEE/DCCV  Consultants:   Cardiology  Antimicrobials:   Anti-infectives (From admission, onward)    Start     Dose/Rate Route Frequency Ordered Stop   06/21/21 1245  cephALEXin (KEFLEX) capsule 500 mg        500 mg Oral Every 12 hours 06/21/21 1228 06/26/21 0959          Medications  Scheduled Meds:  amiodarone  400 mg Oral Daily   apixaban  5 mg Oral BID   cephALEXin  500 mg Oral Q12H   docusate sodium  100 mg Oral BID   insulin aspart  0-15 Units Subcutaneous TID WC   insulin aspart  0-5 Units Subcutaneous QHS   insulin glargine-yfgn  29 Units Subcutaneous Q2200   isosorbide mononitrate  30 mg Oral Daily   levothyroxine  300 mcg Oral QAC breakfast   loratadine  5 mg Oral Daily   mometasone-formoterol  2 puff Inhalation BID   nutrition supplement (JUVEN)  1 packet Oral BID BM   simvastatin  5 mg Oral Daily   sodium chloride flush  3 mL Intravenous Q12H   sodium chloride flush  3 mL Intravenous Q12H   torsemide  80 mg Oral BID   Continuous Infusions: PRN Meds:.acetaminophen **OR** acetaminophen, albuterol, alum & mag hydroxide-simeth, bisacodyl, chlorproMAZINE, hydrALAZINE, loperamide, morphine injection, ondansetron **OR** ondansetron (ZOFRAN) IV, oxyCODONE, polyethylene glycol, sodium chloride      Subjective:   [name] [name] was seen and examined today.  Sitting up in the recliner chair, no acute complaints still has volume overload, swelling in the legs.  No acute chest pain or shortness of breath.   Objective:   Vitals:   06/23/21 0334 06/23/21 0852 06/23/21 0858 06/23/21 1031  BP: 136/72 131/81  132/70  Pulse: (!) 56 64 (!) 58 60  Resp: 19 14 17 13   Temp: 98.3 F (36.8 C) (!) 97.4 F (36.3 C)  (!) 97.3 F (36.3 C)  TempSrc: Oral Axillary  Axillary  SpO2: 98% 93% 98% 99%  Weight: 131.4 kg      Height:        Intake/Output Summary (Last 24 hours) at 06/23/2021 1436 Last data filed at 06/23/2021 1400 Gross per 24 hour  Intake 570 ml  Output 2700 ml  Net -2130 ml     Wt Readings from Last 3 Encounters:  06/23/21 131.4 kg  06/04/21 (!) 138.5 kg  03/07/21 136.1 kg   Physical Exam General: Alert and oriented x 3, NAD Cardiovascular: S1 S2 clear, RRR.  Respiratory: CTAB, no wheezing, rales or rhonchi  Gastrointestinal: Soft, nontender, nondistended, NBS Ext: 1+ pedal edema bilaterally, UNNA+    Data Reviewed:  I have personally reviewed following labs and imaging studies  Micro Results No results found for this or any previous visit (from the past 240 hour(s)).   Radiology Reports DG Chest 1 View  Result Date: 06/20/2021 CLINICAL DATA:  Shortness of breath. EXAM: CHEST  1 VIEW COMPARISON:  06/11/2021 FINDINGS: Heart size remains stable. Decreased interstitial infiltrates are seen since previous study, consistent with decreased interstitial edema. Previously seen small bilateral pleural effusions have also resolved. IMPRESSION: Decreased pulmonary interstitial edema and pleural effusions, consistent with resolving congestive heart failure. Electronically Signed   By: Marlaine Hind M.D.   On: 06/20/2021 14:18   NM Pulmonary Perfusion  Result Date: 06/20/2021 CLINICAL DATA:  Shortness of breath. EXAM: NUCLEAR MEDICINE PERFUSION LUNG SCAN TECHNIQUE: Perfusion images were obtained in multiple projections after intravenous injection of radiopharmaceutical. Ventilation scans intentionally deferred if perfusion scan and chest x-ray adequate for interpretation during COVID 19 epidemic. RADIOPHARMACEUTICALS:  4.4 mCi Tc-17m MAA IV COMPARISON:  January 20, 2021 FINDINGS: No segmental perfusion abnormalities identified. IMPRESSION: No evidence of pulmonary embolus on this study. Electronically Signed   By: Dorise Bullion III M.D.   On: 06/20/2021 15:30   CARDIAC  CATHETERIZATION  Result Date: 06/19/2021 1. Right and left heart filling pressures are mildly elevated. 2. Moderate-severe PAH with PVR 4 WU. 3. Low cardiac index, 1.94. Will continue diuresis with IV Lasix infusion today but hold off on metolazone with rising creatinine and filling pressures not markedly high. PH is likely from OHS/OSA, but will arrange for V/Q scan to rule out chronic PE.   DG Chest Portable 1 View  Result Date: 06/11/2021 CLINICAL DATA:  Shortness of breath. EXAM: PORTABLE CHEST 1 VIEW COMPARISON:  06/04/2021 FINDINGS: 1344 hours. Low volume lordotic film. The cardio pericardial silhouette is enlarged. Diffuse interstitial opacity again noted. Bibasilar atelectasis/infiltrate again noted with tiny bilateral pleural effusions. The visualized bony structures of the thorax show no acute abnormality. Telemetry leads overlie the chest. IMPRESSION: Bibasilar atelectasis/infiltrate with tiny bilateral pleural effusions. Probable component of interstitial edema. Electronically Signed   By: Misty Stanley M.D.   On: 06/11/2021 13:52   DG Chest Port 1 View  Result Date: 06/04/2021 CLINICAL DATA:  Short of breath EXAM: PORTABLE CHEST 1 VIEW COMPARISON:  01/30/2021 FINDINGS: Cardiac enlargement. Pulmonary vascularity with mild interstitial edema. Small bilateral effusions and bibasilar atelectasis. IMPRESSION: Congestive heart failure with mild interstitial edema and small bilateral effusions. Bibasilar atelectasis. Electronically Signed   By: Franchot Gallo M.D.   On: 06/04/2021 18:51   ECHOCARDIOGRAM COMPLETE  Result Date: 06/19/2021    ECHOCARDIOGRAM REPORT   Patient Name:   NERI SAMEK Date of Exam: 06/19/2021 Medical Rec #:  [id number]     Height:       73.0 in Accession #:    [id number]    Weight:       292.3 lb Date of Birth:  01-09-1946     BSA:          2.528 m Patient Age:    57 years      BP:           102/67 mmHg Patient Gender: M             HR:           47 bpm. Exam Location:   Inpatient Procedure: 2D Echo, Cardiac Doppler, Color Doppler and Intracardiac  Opacification Agent Indications:    CHF-Acute Diastolic G38.75  History:        Patient has prior history of Echocardiogram examinations, most                 recent 12/28/2020. CAD and Previous Myocardial Infarction,                 Arrythmias:LBBB and Atrial Fibrillation; Risk                 Factors:Dyslipidemia, Hypertension, Sleep Apnea and Diabetes.                 Thyroid disease.  Sonographer:    Darlina Sicilian RDCS Referring Phys: [place]  1. Left ventricular ejection fraction, by estimation, is 45 to 50%. The left ventricle has mildly decreased function. The left ventricle demonstrates global hypokinesis. The left ventricular internal cavity size was severely dilated. Left ventricular diastolic function could not be evaluated.  2. Right ventricular systolic function is moderately reduced. The right ventricular size is not well visualized. There is moderately elevated pulmonary artery systolic pressure.  3. Left atrial size was moderately dilated.  4. The mitral valve is grossly normal. Trivial mitral valve regurgitation. No evidence of mitral stenosis.  5. The aortic valve is grossly normal. There is mild calcification of the aortic valve. Aortic valve regurgitation is trivial. Aortic valve sclerosis is present, with no evidence of aortic valve stenosis.  6. Aortic dilatation noted. There is borderline dilatation of the ascending aorta, measuring 39 mm.  7. The inferior vena cava is dilated in size with <50% respiratory variability, suggesting right atrial pressure of 15 mmHg. Comparison(s): Changes from prior study are noted. EF slightly decreased (globally) compared to prior. Conclusion(s)/Recommendation(s): Significant bradycardia, down to 40 bpm, noted during study. FINDINGS  Left Ventricle: Left ventricular ejection fraction, by estimation, is 45 to 50%. The left ventricle has mildly  decreased function. The left ventricle demonstrates global hypokinesis. Definity contrast agent was given IV to delineate the left ventricular  endocardial borders. The left ventricular internal cavity size was severely dilated. There is no left ventricular hypertrophy. Left ventricular diastolic function could not be evaluated due to atrial fibrillation. Left ventricular diastolic function could not be evaluated. Right Ventricle: The right ventricular size is not well visualized. Right vetricular wall thickness was not well visualized. Right ventricular systolic function is moderately reduced. There is moderately elevated pulmonary artery systolic pressure. The tricuspid regurgitant velocity is 3.07 m/s, and with an assumed right atrial pressure of 15 mmHg, the estimated right ventricular systolic pressure is 64.3 mmHg. Left Atrium: Left atrial size was moderately dilated. Right Atrium: Right atrial size was not well visualized. Pericardium: There is no evidence of pericardial effusion. Mitral Valve: The mitral valve is grossly normal. Trivial mitral valve regurgitation. No evidence of mitral valve stenosis. Tricuspid Valve: The tricuspid valve is grossly normal. Tricuspid valve regurgitation is trivial. No evidence of tricuspid stenosis. Aortic Valve: The aortic valve is grossly normal. There is mild calcification of the aortic valve. Aortic valve regurgitation is trivial. Aortic valve sclerosis is present, with no evidence of aortic valve stenosis. Pulmonic Valve: The pulmonic valve was not well visualized. Pulmonic valve regurgitation is trivial. No evidence of pulmonic stenosis. Aorta: Aortic dilatation noted. There is borderline dilatation of the ascending aorta, measuring 39 mm. Venous: The inferior vena cava is dilated in size with less than 50% respiratory variability, suggesting right atrial pressure of 15 mmHg. IAS/Shunts: The interatrial  septum was not well visualized.  LEFT VENTRICLE PLAX 2D LVIDd:          6.40 cm LVIDs:         4.90 cm LV PW:         1.00 cm LV IVS:        1.10 cm LVOT diam:     2.30 cm LV SV:         99 LV SV Index:   39 LVOT Area:     4.15 cm  LV Volumes (MOD) LV vol d, MOD A2C: 162.0 ml LV vol d, MOD A4C: 183.0 ml LV vol s, MOD A2C: 95.1 ml LV vol s, MOD A4C: 84.9 ml LV SV MOD A2C:     66.9 ml LV SV MOD A4C:     183.0 ml LV SV MOD BP:      83.6 ml RIGHT VENTRICLE RV S prime:     6.98 cm/s TAPSE (M-mode): 1.3 cm LEFT ATRIUM              Index LA diam:        4.90 cm  1.94 cm/m LA Vol (A2C):   68.8 ml  27.21 ml/m LA Vol (A4C):   104.0 ml 41.13 ml/m LA Biplane Vol: 90.7 ml  35.87 ml/m  AORTIC VALVE LVOT Vmax:   124.00 cm/s LVOT Vmean:  96.900 cm/s LVOT VTI:    0.238 m  AORTA Ao Root diam: 2.70 cm Ao Asc diam:  3.90 cm MITRAL VALVE               TRICUSPID VALVE MV Area (PHT): 3.61 cm    TR Peak grad:   37.7 mmHg MV Decel Time: 210 msec    TR Vmax:        307.00 cm/s MV E velocity: 92.95 cm/s                            SHUNTS                            Systemic VTI:  0.24 m                            Systemic Diam: 2.30 cm Buford Dresser MD Electronically signed by Buford Dresser MD Signature Date/Time: 06/19/2021/4:34:56 PM    Final    CT EXTREMITY LOWER LEFT WO CONTRAST  Result Date: 06/20/2021 CLINICAL DATA:  Left lower leg pain. EXAM: CT OF THE LOWER LEFT EXTREMITY WITHOUT CONTRAST TECHNIQUE: Multidetector CT imaging of the lower left extremity was performed according to the standard protocol. COMPARISON:  Left knee x-rays dated March 09, 2011. FINDINGS: Bones/Joint/Cartilage Prior left total knee arthroplasty. No evidence of hardware failure or loosening. No fracture or dislocation. Joint spaces are preserved. Small knee joint effusion. Ligaments Ligaments are suboptimally evaluated by CT. Muscles and Tendons Scattered lower leg muscle atrophy, most prominently involving the medial gastrocnemius muscle. Soft tissue Diffuse circumferential soft tissue swelling of the  left lower leg. No fluid collection or subcutaneous emphysema. No soft tissue mass. IMPRESSION: 1. Diffuse circumferential soft tissue swelling of the left lower leg, nonspecific, but can be seen with cellulitis. No abscess. 2. Prior left total knee arthroplasty without evidence of hardware complication. Electronically Signed   By: Titus Dubin M.D.   On: 06/20/2021 15:09    Lab Data:  CBC: Recent Labs  Lab 06/19/21 1238 06/23/21 0018  WBC  --  6.1  HGB 14.3  14.3 13.0  HCT 42.0  42.0 41.6  MCV  --  88.3  PLT  --  878*   Basic Metabolic Panel: Recent Labs  Lab 06/19/21 0326 06/19/21 1238 06/20/21 0101 06/21/21 0048 06/22/21 0044 06/23/21 0018  NA 136 139  139 138 138 136 134*  K 3.3* 3.4*  3.5 3.4* 3.6 4.2 3.9  CL 95*  --  96* 94* 95* 93*  CO2 31  --  32 31 31 29   GLUCOSE 90  --  118* 108* 165* 130*  BUN 89*  --  102* 102* 109* 114*  CREATININE 3.07*  --  3.32* 3.29* 3.07* 3.07*  CALCIUM 9.0  --  9.2 9.0 8.6* 8.7*  MG  --   --  2.4  --   --   --    GFR: Estimated Creatinine Clearance: 30 mL/min (A) (by C-G formula based on SCr of 3.07 mg/dL (H)). Liver Function Tests: No results for input(s): AST, ALT, ALKPHOS, BILITOT, PROT, ALBUMIN in the last 168 hours. No results for input(s): LIPASE, AMYLASE in the last 168 hours. No results for input(s): AMMONIA in the last 168 hours. Coagulation Profile: No results for input(s): INR, PROTIME in the last 168 hours. Cardiac Enzymes: No results for input(s): CKTOTAL, CKMB, CKMBINDEX, TROPONINI in the last 168 hours. BNP (last 3 results) No results for input(s): PROBNP in the last 8760 hours. HbA1C: No results for input(s): HGBA1C in the last 72 hours. CBG: Recent Labs  Lab 06/22/21 1145 06/22/21 1605 06/22/21 2102 06/23/21 0634 06/23/21 1033  GLUCAP 178* 155* 156* 105* 121*   Lipid Profile: No results for input(s): CHOL, HDL, LDLCALC, TRIG, CHOLHDL, LDLDIRECT in the last 72 hours. Thyroid Function Tests: No  results for input(s): TSH, T4TOTAL, FREET4, T3FREE, THYROIDAB in the last 72 hours. Anemia Panel: No results for input(s): VITAMINB12, FOLATE, FERRITIN, TIBC, IRON, RETICCTPCT in the last 72 hours. Urine analysis:    Component Value Date/Time   COLORURINE YELLOW 11/19/2020 1736   APPEARANCEUR CLEAR 11/19/2020 1736   LABSPEC 1.015 11/19/2020 1736   PHURINE 6.0 11/19/2020 1736   GLUCOSEU NEGATIVE 11/19/2020 1736   HGBUR NEGATIVE 11/19/2020 1736   BILIRUBINUR NEGATIVE 11/19/2020 1736   KETONESUR NEGATIVE 11/19/2020 1736   PROTEINUR >=300 (A) 11/19/2020 1736   UROBILINOGEN 4.0 (H) 07/23/2014 1356   NITRITE NEGATIVE 11/19/2020 1736   LEUKOCYTESUR NEGATIVE 11/19/2020 1736     Deana Krock M.D. [facility] 06/23/2021, 2:36 PM  Available via Epic secure chat 7am-7pm After 7 pm, please refer to night coverage provider listed on amion.

## 2021-06-24 ENCOUNTER — Encounter (HOSPITAL_COMMUNITY): Payer: Medicare HMO

## 2021-06-24 DIAGNOSIS — J9611 Chronic respiratory failure with hypoxia: Secondary | ICD-10-CM

## 2021-06-24 DIAGNOSIS — J9612 Chronic respiratory failure with hypercapnia: Secondary | ICD-10-CM

## 2021-06-24 LAB — GLUCOSE, CAPILLARY
Glucose-Capillary: 71 mg/dL (ref 70–99)
Glucose-Capillary: 156 mg/dL — ABNORMAL HIGH (ref 70–99)
Glucose-Capillary: 124 mg/dL — ABNORMAL HIGH (ref 70–99)

## 2021-06-24 LAB — BASIC METABOLIC PANEL
Anion gap: 13 (ref 5–15)
BUN: 127 mg/dL — ABNORMAL HIGH (ref 8–23)
CO2: 33 mmol/L — ABNORMAL HIGH (ref 22–32)
Calcium: 9.1 mg/dL (ref 8.9–10.3)
Chloride: 90 mmol/L — ABNORMAL LOW (ref 98–111)
Creatinine, Ser: 3.1 mg/dL — ABNORMAL HIGH (ref 0.61–1.24)
GFR, Estimated: 20 mL/min — ABNORMAL LOW (ref >60)
Glucose, Bld: 104 mg/dL — ABNORMAL HIGH (ref 70–99)
Potassium: 3.2 mmol/L — ABNORMAL LOW (ref 3.5–5.1)
Sodium: 136 mmol/L (ref 135–145)

## 2021-06-24 LAB — MAGNESIUM: Magnesium: 2.5 mg/dL — ABNORMAL HIGH (ref 1.7–2.4)

## 2021-06-24 MED ORDER — POTASSIUM CHLORIDE CRYS ER 20 MEQ PO TBCR
40.0000 meq | EXTENDED_RELEASE_TABLET | Freq: Two times a day (BID) | ORAL | Status: AC
  Administered 2021-06-24 (×2): 40 meq via ORAL
  Filled 2021-06-24 (×2): qty 2

## 2021-06-24 MED ORDER — ATORVASTATIN CALCIUM 10 MG PO TABS
10.0000 mg | ORAL_TABLET | Freq: Every day | ORAL | Status: DC
  Administered 2021-06-24 – 2021-06-26 (×3): 10 mg via ORAL
  Filled 2021-06-24 (×3): qty 1

## 2021-06-24 NOTE — Progress Notes (Signed)
[facility] PROGRESS NOTE    Progress Note  [name] [name]  SJG:[id number] DOB: 10/28/1945 DOA: 06/11/2021 PCP: Nickola Major, MD     Brief Narrative:   [name] [name] is an 75 y.o. male past medical history of atrial fibrillation CAD status post stenting, chronic kidney stage IIIb, essential hypertension morbid obesity, non-Hodgkin's lymphoma treated with chemo and radiation diabetes mellitus type 2 obstructive sleep apnea comes in with shortness of breath likely due to acute diastolic heart failure the advanced heart failure team was consulted was started on IV Lasix drip underwent TEE/DCC cardioversion on 06/21/2019     Assessment/Plan:   Acute on chronic diastolic CHF (congestive heart failure) ([place]) Right heart cath on 06/19/2021 TEE on 06/21/2021 showed an EF of 45%. VQ scan showed no evidence of chronic PE. Improved urinary output with IV Lasix drip and metolazone transitioning to oral torsemide. Negative about 16.2 L, still appears fluid overloaded. Stabilized at around 3.  Persistent atrial fibrillation: Underwent cardioversion on 06/20/2021, currently on amiodarone, beta-blocker was stopped due to bradycardia. Continue Eliquis. Amiodarone to be decreased to 200 mg p.o. twice daily at discharge. Continue to work with physical therapy and Occupational Therapy.  Moderate to severe pulmonary hypertension: Right heart cath performed PVR 4 on WU. Likely due to obstructive sleep apnea obesity hypoventilation syndrome. VQ scan negative for chronic PEs.  Chronic kidney disease stage IV: With a baseline creatinine of around 2.6, now has plateaued after diuresis to 3. Cardiology held diuretics.  COPD: Continue inhalers.  Possible on diagnosis with her sleep apnea: Will need a sleep study as an outpatient.  Essential hypertension: Continue Imdur and torsemide.  Diabetes mellitus type 2 insulin-dependent in the setting of chronic kidney disease stage IV: With  an A1c of 5.8. Blood glucose well controlled continue long-acting insulin plus sliding scale.  Hypothyroidism: Continue Synthroid.  DVT prophylaxis: Eliquis Family Communication:none Status is: Inpatient  Remains inpatient appropriate because: Acute systolic and diastolic heart failure titrating medications.        Code Status:     Code Status Orders  (From admission, onward)           Start     Ordered   06/11/21 1614  Full code  Continuous        06/11/21 1617           Code Status History     Date Active Date Inactive Code Status Order ID Comments User Context   01/30/2021 0927 02/07/2021 1506 Full Code [id number]  Rodena Goldmann, DO ED   01/20/2021 0454 01/24/2021 1933 Full Code [id number]  Bernadette Hoit, DO Inpatient   12/28/2020 0450 01/03/2021 0214 Full Code [id number]  Zierle-Ghosh, Idamay, DO ED   10/06/2020 1402 10/11/2020 1652 Full Code [id number]  Barton Dubois, MD ED   08/04/2020 2115 08/10/2020 2111 Full Code [id number]  Patrecia Pour, MD Inpatient   07/19/2020 2245 07/20/2020 1538 Full Code [id number]  Bernadette Hoit, DO Inpatient   06/24/2020 0337 06/29/2020 1958 Full Code [id number]  Reubin Milan, MD ED   01/28/2020 2233 02/01/2020 2220 Full Code [id number]  Bethena Roys, MD Inpatient   05/24/2018 1421 05/28/2018 0433 Full Code [id number]  Macie Burows Inpatient   04/01/2017 0248 04/02/2017 1412 Full Code [id number]  Reubin Milan, MD Inpatient   03/21/2017 0157 03/21/2017 1700 Full Code [id number]  Orvan Falconer, MD ED   01/19/2017 1740 01/22/2017 2007 Full Code [id number]  Roxan Hockey, MD ED   07/24/2016  1738 07/28/2016 1500 Full Code [id number]  Truett Mainland, DO Inpatient   05/14/2015 2111 05/16/2015 1934 Full Code [id number]  Truett Mainland, DO Inpatient   09/27/2014 1238 09/28/2014 0346 Full Code [id number]  Marybelle Killings, MD [facility]   07/23/2014 1849 07/25/2014 2043 Full Code [id number]  Doree Albee, MD ED   11/10/2011 1517 11/13/2011 1848  Full Code [id number]  Conley Canal, RN Inpatient         IV Access:   Peripheral IV   Procedures and diagnostic studies:   No results found.   Medical Consultants:   None.   Subjective:    [name] [name] relates he feels much better today.  Objective:    Vitals:   06/23/21 2324 06/24/21 0340 06/24/21 0440 06/24/21 0736  BP: 105/60 119/75  (!) 113/58  Pulse: 63 (!) 51  (!) 56  Resp: 16 18  19   Temp: 98.2 F (36.8 C) 97.9 F (36.6 C)  97.6 F (36.4 C)  TempSrc: Oral Oral  Oral  SpO2: 97% 96%  98%  Weight:   128.3 kg   Height:       SpO2: 98 % O2 Flow Rate (L/min): 4 L/min FiO2 (%): 32 %   Intake/Output Summary (Last 24 hours) at 06/24/2021 0743 Last data filed at 06/24/2021 0569 Gross per 24 hour  Intake 533 ml  Output 4700 ml  Net -4167 ml   Filed Weights   06/22/21 0500 06/23/21 0334 06/24/21 0440  Weight: 131.3 kg 131.4 kg 128.3 kg    Exam: General exam: In no acute distress. Respiratory system: Good air movement and clear to auscultation. Cardiovascular system: S1 & S2 heard, RRR. No JV  Gastrointestinal system: Abdomen is nondistended, soft and nontender.  Extremities: No pedal edema. Skin: No rashes, lesions or ulcers Psychiatry: Poor insight and judgment of medical condition.   Data Reviewed:    Labs: Basic Metabolic Panel: Recent Labs  Lab 06/19/21 0326 06/19/21 1238 06/20/21 0101 06/21/21 0048 06/22/21 0044 06/23/21 0018  NA 136 139  139 138 138 136 134*  K 3.3* 3.4*  3.5 3.4* 3.6 4.2 3.9  CL 95*  --  96* 94* 95* 93*  CO2 31  --  32 31 31 29   GLUCOSE 90  --  118* 108* 165* 130*  BUN 89*  --  102* 102* 109* 114*  CREATININE 3.07*  --  3.32* 3.29* 3.07* 3.07*  CALCIUM 9.0  --  9.2 9.0 8.6* 8.7*  MG  --   --  2.4  --   --   --    GFR Estimated Creatinine Clearance: 29.6 mL/min (A) (by C-G formula based on SCr of 3.07 mg/dL (H)). Liver Function Tests: No results for input(s): AST, ALT, ALKPHOS, BILITOT,  PROT, ALBUMIN in the last 168 hours. No results for input(s): LIPASE, AMYLASE in the last 168 hours. No results for input(s): AMMONIA in the last 168 hours. Coagulation profile No results for input(s): INR, PROTIME in the last 168 hours. COVID-19 Labs  No results for input(s): DDIMER, FERRITIN, LDH, CRP in the last 72 hours.  Lab Results  Component Value Date   SARSCOV2NAA NEGATIVE 06/11/2021   SARSCOV2NAA NEGATIVE 06/04/2021   SARSCOV2NAA NEGATIVE 02/06/2021   [place] NEGATIVE 01/30/2021    CBC: Recent Labs  Lab 06/19/21 1238 06/23/21 0018  WBC  --  6.1  HGB 14.3  14.3 13.0  HCT 42.0  42.0 41.6  MCV  --  88.3  PLT  --  108*   Cardiac Enzymes: No results for input(s): CKTOTAL, CKMB, CKMBINDEX, TROPONINI in the last 168 hours. BNP (last 3 results) No results for input(s): PROBNP in the last 8760 hours. CBG: Recent Labs  Lab 06/23/21 0634 06/23/21 1033 06/23/21 1600 06/23/21 2109 06/24/21 0626  GLUCAP 105* 121* 95 99 71   D-Dimer: No results for input(s): DDIMER in the last 72 hours. Hgb A1c: No results for input(s): HGBA1C in the last 72 hours. Lipid Profile: No results for input(s): CHOL, HDL, LDLCALC, TRIG, CHOLHDL, LDLDIRECT in the last 72 hours. Thyroid function studies: No results for input(s): TSH, T4TOTAL, T3FREE, THYROIDAB in the last 72 hours.  Invalid input(s): FREET3 Anemia work up: No results for input(s): VITAMINB12, FOLATE, FERRITIN, TIBC, IRON, RETICCTPCT in the last 72 hours. Sepsis Labs: Recent Labs  Lab 06/23/21 0018  WBC 6.1   Microbiology No results found for this or any previous visit (from the past 240 hour(s)).   Medications:    amiodarone  400 mg Oral Daily   apixaban  5 mg Oral BID   cephALEXin  500 mg Oral Q12H   docusate sodium  100 mg Oral BID   insulin aspart  0-15 Units Subcutaneous TID WC   insulin aspart  0-5 Units Subcutaneous QHS   insulin aspart  3 Units Subcutaneous TID WC   insulin glargine-yfgn  33  Units Subcutaneous Q2200   isosorbide mononitrate  30 mg Oral Daily   levothyroxine  300 mcg Oral QAC breakfast   loratadine  5 mg Oral Daily   mometasone-formoterol  2 puff Inhalation BID   nutrition supplement (JUVEN)  1 packet Oral BID BM   simvastatin  5 mg Oral Daily   sodium chloride flush  3 mL Intravenous Q12H   sodium chloride flush  3 mL Intravenous Q12H   torsemide  80 mg Oral BID   Continuous Infusions:    LOS: 12 days   Charlynne Cousins  [facility]  06/24/2021, 7:43 AM

## 2021-06-24 NOTE — Progress Notes (Signed)
Patient ID: [name] [name], male   DOB: [birth year], 75 y.o.   MRN: [id number]     Advanced Heart Failure Rounding Note  PCP-Cardiologist: Rozann Lesches, MD   Subjective:    Underwent TEE/DC-CV 11/18. EF 45-50%. Remains in NSR  On Torsemide 80 mg BID. Excellent diuresis yesterday, weight down.  BMET not done yet.   Left knee is improving, less painful.  Able to ambulate.   RHC 11/17: RA mean 10 RV 67/10 PA 69/22 PCWP mean 18 Oxygen saturations: PA 63% AO 99% Cardiac Output (Fick) 4.91  Cardiac Index (Fick) 1.94 PVR 4.1 WU PAPI 5.7  Objective:   Weight Range: 128.3 kg Body mass index is 37.32 kg/m.   Vital Signs:   Temp:  [97.3 F (36.3 C)-98.2 F (36.8 C)] 97.6 F (36.4 C) (11/22 0736) Pulse Rate:  [51-65] 56 (11/22 0736) Resp:  [13-25] 19 (11/22 0736) BP: (105-132)/(58-81) 113/58 (11/22 0736) SpO2:  [93 %-99 %] 98 % (11/22 0736) Weight:  [128.3 kg] 128.3 kg (11/22 0440) Last BM Date: 06/21/21  Weight change: Filed Weights   06/22/21 0500 06/23/21 0334 06/24/21 0440  Weight: 131.3 kg 131.4 kg 128.3 kg    Intake/Output:   Intake/Output Summary (Last 24 hours) at 06/24/2021 0805 Last data filed at 06/24/2021 0744 Gross per 24 hour  Intake 770 ml  Output 5100 ml  Net -4330 ml      Physical Exam    General: NAD Neck: JVP 8 cm, no thyromegaly or thyroid nodule.  Lungs: Clear to auscultation bilaterally with normal respiratory effort. CV: Nondisplaced PMI.  Heart regular S1/S2, no S3/S4, no murmur.  1+ edema to knees.   Abdomen: Soft, nontender, no hepatosplenomegaly, no distention.  Skin: Intact without lesions or rashes.  Neurologic: Alert and oriented x 3.  Psych: Normal affect. Extremities: No clubbing or cyanosis.  HEENT: Normal.    Telemetry   NSR 50s personally reviewed.    Labs    CBC Recent Labs    06/23/21 0018  WBC 6.1  HGB 13.0  HCT 41.6  MCV 88.3  PLT 893*   Basic Metabolic Panel Recent Labs    06/22/21 0044  06/23/21 0018  NA 136 134*  K 4.2 3.9  CL 95* 93*  CO2 31 29  GLUCOSE 165* 130*  BUN 109* 114*  CREATININE 3.07* 3.07*  CALCIUM 8.6* 8.7*   Liver Function Tests No results for input(s): AST, ALT, ALKPHOS, BILITOT, PROT, ALBUMIN in the last 72 hours. No results for input(s): LIPASE, AMYLASE in the last 72 hours. Cardiac Enzymes No results for input(s): CKTOTAL, CKMB, CKMBINDEX, TROPONINI in the last 72 hours.  BNP: BNP (last 3 results) Recent Labs    01/30/21 0312 06/04/21 1820 06/11/21 1338  BNP 267.0* 446.0* 199.1*    ProBNP (last 3 results) No results for input(s): PROBNP in the last 8760 hours.   D-Dimer No results for input(s): DDIMER in the last 72 hours. Hemoglobin A1C No results for input(s): HGBA1C in the last 72 hours. Fasting Lipid Panel No results for input(s): CHOL, HDL, LDLCALC, TRIG, CHOLHDL, LDLDIRECT in the last 72 hours. Thyroid Function Tests No results for input(s): TSH, T4TOTAL, T3FREE, THYROIDAB in the last 72 hours.  Invalid input(s): FREET3  Other results:   Imaging    No results found.   Medications:     Scheduled Medications:  amiodarone  400 mg Oral Daily   apixaban  5 mg Oral BID   cephALEXin  500 mg Oral Q12H  docusate sodium  100 mg Oral BID   insulin aspart  0-15 Units Subcutaneous TID WC   insulin aspart  0-5 Units Subcutaneous QHS   insulin aspart  3 Units Subcutaneous TID WC   insulin glargine-yfgn  33 Units Subcutaneous Q2200   isosorbide mononitrate  30 mg Oral Daily   levothyroxine  300 mcg Oral QAC breakfast   loratadine  5 mg Oral Daily   mometasone-formoterol  2 puff Inhalation BID   nutrition supplement (JUVEN)  1 packet Oral BID BM   simvastatin  5 mg Oral Daily   sodium chloride flush  3 mL Intravenous Q12H   sodium chloride flush  3 mL Intravenous Q12H   torsemide  80 mg Oral BID    Infusions:    PRN Medications: acetaminophen **OR** acetaminophen, albuterol, alum & mag hydroxide-simeth,  bisacodyl, chlorproMAZINE, hydrALAZINE, loperamide, morphine injection, ondansetron **OR** ondansetron (ZOFRAN) IV, oxyCODONE, polyethylene glycol, sodium chloride, temazepam    Assessment/Plan   1. Acute on chronic diastolic CHF: In setting of CKD stage IV and persistent atrial fibrillation.  Echo in 5/22 with EF 55-60%, mild RV enlargement, normal RV systolic function. Admitted with marked volume overload. TEE this admit with EF 45-50%, mildly dilated RV with mild RV dysfunction, mild AI. [place] 11/17 RA mean, PCWP mean 18, moderate to severe PAH with PVR 4 WU, CI low at 1.94. Lasix gtt stopped 11/18, now on po torsemide.  Creatinine stable at 3 yesterday. Probably mild residual volume overload on exam.  - Continue torsemide 80 mg bid as long as creatinine stable today (BMET stat).  - Would like to eventually get him on SGLT2 inhibitor when creatinine settles down.  2. Pulmonary hypertension:  Moderate to severe PAH with PVR 4 WU on RHC 06/19/21. 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 No chronic PE on V/Q scan.  - Not candidate for pulmonary vasodilators.  3. Atrial fibrillation: Persistent since 12/21, DCCV never attempted.  Suspect he will not stay in NSR without anti-arrhythmic. S/p DC-CV on 11/18. Remains in NSR this am.  - ECG today.  - Bisoprolol stopped with bradycardia.  - Continue amio 400 mg daily x 2 weeks then 200 mg daily.  - Continue apixaban 5 mg bid.  4. CAD: S/p NSTEMI with occluded OM1 in 6/18, had DES to 80% stenosis mLAD at that time.  No chest pain. No coronary angiography in absence of ACS given CKD stage 4.  - Continue statin.  - No ASA given Eliquis use.  5. AKI on CKD: Stage IV. Baseline Scr 2.6-2.7.  Creatinine 3 yesterday.  - BMET stat.  6. Type 2 DM: Continue insulin.  7. Chronic hypoxemic respiratory failure: He is on 2L home oxygen.  ?OHS/OSA, ?COPD.  He smoked in the past but not heavily.  - Would get PFTs after diuresis => will order today.  - Needs home sleep  study.  8. Left knee pain: CT suggestive of diffuse tissue swelling ?cellulitis.  Uric acid 13.1 concerning for acute gout.  - Started on prednisone 40 mg X 3 days. No colchicine with CKD IV.  - management per [facility]. Cephalexin started 11/19. AF.   He is walking in the halls.  If creatinine stable today, could conceivably go home today.  Will need CHF clinic followup.   Length of Stay: 53  [name], MD  06/24/2021, 8:05 AM  Advanced Heart Failure Team Pager [phone] (M-F; 7a - 5p)  Please contact [facility] for night-coverage after hours (5p -7a ) and weekends  on [email or web address]

## 2021-06-24 NOTE — Progress Notes (Signed)
Physical Therapy Treatment Patient Details Name: [name] [name] MRN: [id number] DOB: July 05, 1946 Today's Date: 06/24/2021   History of Present Illness Pt is a 75 yo male presenting 06/11/21 with LE edema and SOB; workup for CHF exacerbation. S/p electrocardiogram and TEE on 11/18. PMH includes: afib, CAD, CKD III, HTN, HLD, LBBB, NSTEMI (2018), DM II, sleep apnea (sleeps in recliner), morbid obesity, and neuropathy. Uses 3-4L O2 at baseline.    PT Comments    Pt is progressing with mobility. Today's session focused on progressing ambulation to increase activity tolerance. Pt ambulated in hall with supervision, requiring one seated rest break and cues for safe use of rollator. Continue to recommend [facility] PT after discharge to continue to progress functional mobility and exercise tolerance for maximum independence. Will continue to follow acutely to address short-term PT goals.    Recommendations for follow up therapy are one component of a multi-disciplinary discharge planning process, led by the attending physician.  Recommendations may be updated based on patient status, additional functional criteria and insurance authorization.  Follow Up Recommendations  Home health PT     Assistance Recommended at Discharge Intermittent Supervision/Assistance  Equipment Recommendations  None recommended by PT    Recommendations for Other Services       Precautions / Restrictions Precautions Precautions: Fall Precaution Comments: 4L SpO2 for mobility Restrictions Weight Bearing Restrictions: No     Mobility  Bed Mobility Overal bed mobility: Modified Independent             General bed mobility comments: pt able to go supine to sit with HOB elevated and use of bedrails    Transfers Overall transfer level: Needs assistance Equipment used: Rollator (4 wheels) Transfers: Sit to/from Stand Sit to Stand: Supervision           General transfer comment: pt stood from EOB and rollator  seat with supervision for safety; cues required to lock rollator when standing from EOB    Ambulation/Gait Ambulation/Gait assistance: Supervision Gait Distance (Feet): 168 Feet (+50) Assistive device: Rollator (4 wheels) Gait Pattern/deviations: Step-through pattern;Wide base of support Gait velocity: reduced     General Gait Details: required one seated rest break (~5 minutes) due to SOB, cues to lock rollator and for self-monitoring of exertion.   Stairs             Wheelchair Mobility    Modified Rankin (Stroke Patients Only)       Balance Overall balance assessment: Needs assistance Sitting-balance support: No upper extremity supported;Feet supported Sitting balance-Leahy Scale: Good Sitting balance - Comments: pt able to sit EOB and use urinal independently   Standing balance support: Bilateral upper extremity supported Standing balance-Leahy Scale: Poor Standing balance comment: reliant on BUE support with rollator during static stance and ambulation                            Cognition Arousal/Alertness: Awake/alert Behavior During Therapy: WFL for tasks assessed/performed Overall Cognitive Status: No family/caregiver present to determine baseline cognitive functioning Area of Impairment: Safety/judgement                         Safety/Judgement: Decreased awareness of safety     General Comments: pt following all instructions, slightly decreased insight to safety (required cuing to lock rollator). Good recall of how to perform pursed lip breathing        Exercises      General  Comments General comments (skin integrity, edema, etc.): SpO2 down to 88% during ambulation on 3L, recovered with 4L, seated rest, and pursed lip breathing. 93% on 3L post-ambulation. Pt prefers rollator for mobility and states he has 3 at home.      Pertinent Vitals/Pain Pain Assessment: Faces Faces Pain Scale: Hurts little more Pain Location: right  ear (has a sore) Pain Descriptors / Indicators: Tender;Sore Pain Intervention(s): Monitored during session;Other (comment) (notified nurse)    Home Living                          Prior Function            PT Goals (current goals can now be found in the care plan section) Acute Rehab PT Goals Patient Stated Goal: return home PT Goal Formulation: With patient Time For Goal Achievement: 06/26/21 Potential to Achieve Goals: Good Progress towards PT goals: Progressing toward goals    Frequency    Min 3X/week      PT Plan Current plan remains appropriate    Co-evaluation              AM-PAC PT "6 Clicks" Mobility   Outcome Measure  Help needed turning from your back to your side while in a flat bed without using bedrails?: None Help needed moving from lying on your back to sitting on the side of a flat bed without using bedrails?: A Little Help needed moving to and from a bed to a chair (including a wheelchair)?: A Little Help needed standing up from a chair using your arms (e.g., wheelchair or bedside chair)?: A Little Help needed to walk in hospital room?: A Little Help needed climbing 3-5 steps with a railing? : A Little 6 Click Score: 19    End of Session Equipment Utilized During Treatment: Oxygen;Gait belt Activity Tolerance: Patient tolerated treatment well Patient left: in chair;with call bell/phone within reach Nurse Communication: Mobility status;Other (comment) (sore on right ear) PT Visit Diagnosis: Other abnormalities of gait and mobility (R26.89);Muscle weakness (generalized) (M62.81);Difficulty in walking, not elsewhere classified (R26.2)     Time: 1030-1103 PT Time Calculation (min) (ACUTE ONLY): 33 min  Charges:  $Gait Training: 8-22 mins $Therapeutic Exercise: 8-22 mins                     Brandon Melnick, SPT   Brandon Melnick 06/24/2021, 12:30 PM

## 2021-06-24 NOTE — Progress Notes (Signed)
Mobility Specialist Progress Note    06/24/21 1416  Mobility  Activity Ambulated in hall  Level of Assistance Modified independent, requires aide device or extra time  Assistive Device Four wheel walker  Distance Ambulated (ft) [phone])  Mobility Ambulated independently in hallway  Mobility Response Tolerated well  Mobility performed by Mobility specialist  Bed Position Chair  $Mobility charge 1 Mobility   Pt received in bed and agreeable. Ambulated on 4LO2. Took x2 long seated breaks to recover. Left in chair with call bell in reach.   [facility] Mobility Specialist  M.S. Primary Phone: [phone] M.S. Secondary Phone: [phone]

## 2021-06-24 NOTE — TOC CM/SW Note (Addendum)
HF TOC CM spoke to pt and states he lives with dtr and SIL. States he has oxygen with [employer]. [place] for shower stool or tub bench. Pt is willing to pay out of pocket. Contacted [facility] rep, Stacie to make aware of scheduled dc home today. [place] orders are in Epic. [place], Heart Failure TOC CM [phone]

## 2021-06-25 DIAGNOSIS — N1832 Chronic kidney disease, stage 3b: Secondary | ICD-10-CM

## 2021-06-25 LAB — BASIC METABOLIC PANEL
Anion gap: 11 (ref 5–15)
BUN: 124 mg/dL — ABNORMAL HIGH (ref 8–23)
CO2: 33 mmol/L — ABNORMAL HIGH (ref 22–32)
Calcium: 9 mg/dL (ref 8.9–10.3)
Chloride: 94 mmol/L — ABNORMAL LOW (ref 98–111)
Creatinine, Ser: 3.13 mg/dL — ABNORMAL HIGH (ref 0.61–1.24)
GFR, Estimated: 20 mL/min — ABNORMAL LOW (ref >60)
Glucose, Bld: 79 mg/dL (ref 70–99)
Potassium: 3.4 mmol/L — ABNORMAL LOW (ref 3.5–5.1)
Sodium: 138 mmol/L (ref 135–145)

## 2021-06-25 LAB — GLUCOSE, CAPILLARY
Glucose-Capillary: 93 mg/dL (ref 70–99)
Glucose-Capillary: 109 mg/dL — ABNORMAL HIGH (ref 70–99)
Glucose-Capillary: 146 mg/dL — ABNORMAL HIGH (ref 70–99)
Glucose-Capillary: 136 mg/dL — ABNORMAL HIGH (ref 70–99)
Glucose-Capillary: 91 mg/dL (ref 70–99)
Glucose-Capillary: 83 mg/dL (ref 70–99)

## 2021-06-25 MED ORDER — ADULT MULTIVITAMIN W/MINERALS CH
1.0000 | ORAL_TABLET | Freq: Every day | ORAL | Status: DC
  Administered 2021-06-25 – 2021-06-26 (×2): 1 via ORAL
  Filled 2021-06-25 (×2): qty 1

## 2021-06-25 MED ORDER — POTASSIUM CHLORIDE CRYS ER 20 MEQ PO TBCR
20.0000 meq | EXTENDED_RELEASE_TABLET | Freq: Once | ORAL | Status: AC
  Administered 2021-06-25: 20 meq via ORAL
  Filled 2021-06-25: qty 1

## 2021-06-25 NOTE — Progress Notes (Signed)
[facility]  PROGRESS NOTE  [name] [name] AOZ:[id number] DOB: 1946-05-03 DOA: 06/11/2021 PCP: Nickola Major, MD   Brief HPI:   75 year old male with past medical history of atrial fibrillation, CAD s/p stenting, CKD stage IIIb, essential hypertension, morbid obesity, non-Hodgkin's lymphoma treated with chemotherapy and radiation treatment, diabetes mellitus type 2, OSA came with shortness of breath due to acute diastolic heart failure.  Advanced heart failure team was consulted and patient started on IV Lasix infusion.  Underwent TEE/DC cardioversion on 06/20/2021  Subjective   Patient seen and examined, denies shortness of breath.   Assessment/Plan:   Acute on chronic diastolic heart failure -Patient underwent right heart cath on 06/19/2021 -TEE on 06/20/2021 showed EF 45% -VQ scan showed no evidence of PE -Patient started on IV Lasix drip and metolazone, transition to oral torsemide -Diuresed well with above regimen -Net -22 L -Diuretics is currently on hold due to worsening renal function   Persistent atrial fibrillation -Patient underwent cardioversion on 06/20/2021, started on amiodarone -Beta-blocker was stopped due to bradycardia -Continue anticoagulation with Eliquis -Cardiology recommends-Continue amio 400 mg daily x 2 weeks then 200 mg daily.    Moderate to severe pulmonary hypertension -Likely from OSA/OHS -VQ scan negative for chronic PEs  CKD stage IV -Baseline creatinine of 2.6 -Creatinine went up to 3.32 with diuresis, diuretics on hold at this time -Creatinine is down to 3.13 today.  Possible OSA -Will need sleep study as outpatient  Hypertension -Continue Imdur, torsemide  Diabetes mellitus type 2 -A1c is 5.8 -Continue sliding scale insulin NovoLog -Glargine 33 units subcu daily -CBG well controlled  Hypothyroidism -Continue Synthroid   Medications     amiodarone  400 mg Oral Daily   apixaban  5 mg Oral BID   atorvastatin  10 mg  Oral Daily   cephALEXin  500 mg Oral Q12H   docusate sodium  100 mg Oral BID   insulin aspart  0-15 Units Subcutaneous TID WC   insulin aspart  0-5 Units Subcutaneous QHS   insulin aspart  3 Units Subcutaneous TID WC   insulin glargine-yfgn  33 Units Subcutaneous Q2200   isosorbide mononitrate  30 mg Oral Daily   levothyroxine  300 mcg Oral QAC breakfast   loratadine  5 mg Oral Daily   mometasone-formoterol  2 puff Inhalation BID   multivitamin with minerals  1 tablet Oral Daily   nutrition supplement (JUVEN)  1 packet Oral BID BM   sodium chloride flush  3 mL Intravenous Q12H   sodium chloride flush  3 mL Intravenous Q12H     Data Reviewed:   CBG:  Recent Labs  Lab 06/24/21 1636 06/24/21 2109 06/25/21 0614 06/25/21 0847 06/25/21 1059  GLUCAP 156* 124* 109* 146* 136*    SpO2: 91 % O2 Flow Rate (L/min): 3 L/min FiO2 (%): 32 %    Vitals:   06/25/21 0624 06/25/21 0749 06/25/21 0751 06/25/21 1100  BP:  110/63  110/89  Pulse:  (!) 52  (!) 51  Resp:  17  16  Temp:  98.1 F (36.7 C)  (!) 97.5 F (36.4 C)  TempSrc:  Oral  Oral  SpO2:  96% 99% 91%  Weight: 125 kg     Height:         Intake/Output Summary (Last 24 hours) at 06/25/2021 1559 Last data filed at 06/25/2021 1300 Gross per 24 hour  Intake 1494 ml  Output 3850 ml  Net -2356 ml    11/21 1901 - 11/23  0700 In: 1311 [P.O.:1308; I.V.:3] Out: 7000 [Urine:7000]  Filed Weights   06/23/21 0334 06/24/21 0440 06/25/21 0624  Weight: 131.4 kg 128.3 kg 125 kg    Data Reviewed: Basic Metabolic Panel: Recent Labs  Lab 06/20/21 0101 06/21/21 0048 06/22/21 0044 06/23/21 0018 06/24/21 0809 06/25/21 0207  NA 138 138 136 134* 136 138  K 3.4* 3.6 4.2 3.9 3.2* 3.4*  CL 96* 94* 95* 93* 90* 94*  CO2 32 31 31 29  33* 33*  GLUCOSE 118* 108* 165* 130* 104* 79  BUN 102* 102* 109* 114* 127* 124*  CREATININE 3.32* 3.29* 3.07* 3.07* 3.10* 3.13*  CALCIUM 9.2 9.0 8.6* 8.7* 9.1 9.0  MG 2.4  --   --   --  2.5*  --     Liver Function Tests: No results for input(s): AST, ALT, ALKPHOS, BILITOT, PROT, ALBUMIN in the last 168 hours. No results for input(s): LIPASE, AMYLASE in the last 168 hours. No results for input(s): AMMONIA in the last 168 hours. CBC: Recent Labs  Lab 06/19/21 1238 06/23/21 0018  WBC  --  6.1  HGB 14.3  14.3 13.0  HCT 42.0  42.0 41.6  MCV  --  88.3  PLT  --  108*   Cardiac Enzymes: No results for input(s): CKTOTAL, CKMB, CKMBINDEX, TROPONINI in the last 168 hours. BNP (last 3 results) Recent Labs    01/30/21 0312 06/04/21 1820 06/11/21 1338  BNP 267.0* 446.0* 199.1*    ProBNP (last 3 results) No results for input(s): PROBNP in the last 8760 hours.  CBG: Recent Labs  Lab 06/24/21 1636 06/24/21 2109 06/25/21 0614 06/25/21 0847 06/25/21 1059  GLUCAP 156* 124* 109* 146* 136*       Radiology Reports  No results found.     Antibiotics: Anti-infectives (From admission, onward)    Start     Dose/Rate Route Frequency Ordered Stop   06/21/21 1245  cephALEXin (KEFLEX) capsule 500 mg        500 mg Oral Every 12 hours 06/21/21 1228 06/26/21 0959         DVT prophylaxis: Apixaban  Code Status: Full code  Family Communication: No family at bedside   Consultants: Cardiology  Procedures:     Objective    Physical Examination:   General-appears in no acute distress Heart-S1-S2, regular, no murmur auscultated Lungs-clear to auscultation bilaterally, no wheezing or crackles auscultated Abdomen-soft, nontender, no organomegaly Extremities-no edema in the lower extremities Neuro-alert, oriented x3, no focal deficit noted  Status is: Inpatient  Dispo: The patient is from: Home              Anticipated d/c is to: Home              Anticipated d/c date is: 06/26/2021              Patient currently not stable for discharge  Barrier to discharge-awaiting for improvement in creatinine  COVID-19 Labs  No results for input(s): DDIMER,  FERRITIN, LDH, CRP in the last 72 hours.  Lab Results  Component Value Date   [place] NEGATIVE 06/11/2021   [place] NEGATIVE 06/04/2021   [place] NEGATIVE 02/06/2021   [place] NEGATIVE 01/30/2021         No results found for this or any previous visit (from the past 240 hour(s)).  Oswald Hillock   [facility] If 7PM-7AM, please contact night-coverage at [email or web address], Office  [phone]   06/25/2021, 3:59 PM  LOS: 13 days

## 2021-06-25 NOTE — Progress Notes (Signed)
Mobility Specialist Progress Note    06/25/21 1427  Mobility  Activity Ambulated in hall  Level of Assistance Modified independent, requires aide device or extra time  Assistive Device Four wheel walker  Distance Ambulated (ft) 235 ft  Mobility Ambulated independently in hallway  Mobility Response Tolerated well  Mobility performed by Mobility specialist  Bed Position Chair  $Mobility charge 1 Mobility   Pt received in chair and agreeable. No complaints on walk. Ambulated on 4LO2 and took x1 seated break. Returned to chair with call bell in reach.  [facility] Mobility Specialist  M.S. Primary Phone: [phone] M.S. Secondary Phone: [phone]

## 2021-06-25 NOTE — TOC CM/SW Note (Signed)
HF TOC CM spoke to pt at bedside. States he may need PTAR transport home. HH arranged with [facility]. And pt has shower chair in room. Explained his family will need to bring his portable oxygen tank to dc home if goes by car. [place], Heart Failure TOC CM [phone]

## 2021-06-25 NOTE — Progress Notes (Signed)
Pt states that he does not want to wear his CPAP tonight. Stated that his nose was bleeding the other night and he will try without it. Pt will call if he needs it.

## 2021-06-25 NOTE — Progress Notes (Signed)
Pt's nose is bleeding. No cpap at this time.

## 2021-06-25 NOTE — Progress Notes (Signed)
Physical Therapy Treatment Patient Details Name: [name] [name] MRN: [id number] DOB: 1946/06/20 Today's Date: 06/25/2021   History of Present Illness Pt is a 75 yo male presenting 06/11/21 with LE edema and SOB; workup for CHF exacerbation. S/p electrocardiogram and TEE on 11/18. PMH includes: afib, CAD, CKD III, HTN, HLD, LBBB, NSTEMI (2018), DM II, sleep apnea (sleeps in recliner), morbid obesity, and neuropathy. Uses 3-4L O2 at baseline.    PT Comments    Pt is progressing well with mobility. Today's session focused on ambulation to increase activity tolerance and pt education to prepare to return home. Discussed assistance needed once returning home and the importance of continuing to mobilize. Continue to recommend [facility] PT after discharge to maximize independence. Will continue to follow acutely to address short-term PT goals.   Recommendations for follow up therapy are one component of a multi-disciplinary discharge planning process, led by the attending physician.  Recommendations may be updated based on patient status, additional functional criteria and insurance authorization.  Follow Up Recommendations  Home health PT     Assistance Recommended at Discharge Intermittent Supervision/Assistance  Equipment Recommendations  None recommended by PT    Recommendations for Other Services       Precautions / Restrictions Precautions Precautions: Fall Precaution Comments: 4L SpO2 for mobility Restrictions Weight Bearing Restrictions: No     Mobility  Bed Mobility Overal bed mobility: Modified Independent             General bed mobility comments: pt able to go supine to sit with HOB elevated and use of bedrails    Transfers Overall transfer level: Needs assistance Equipment used: Rollator (4 wheels) Transfers: Sit to/from Stand Sit to Stand: Supervision           General transfer comment: pt stood from EOB and rollator seat with supervision for safety; cues required  to lock rollator when standing from EOB    Ambulation/Gait Ambulation/Gait assistance: Supervision Gait Distance (Feet): 16 Feet (+96, +106) Assistive device: Rollator (4 wheels) Gait Pattern/deviations: Step-through pattern;Wide base of support;Decreased dorsiflexion - right Gait velocity: reduced     General Gait Details: two seated rest breaks (~1 minute and ~5 minutes) due to fatigue in legs, cues to lock rollator, back up to wall before sitting, and bring rollator closer   Stairs             Wheelchair Mobility    Modified Rankin (Stroke Patients Only)       Balance Overall balance assessment: Needs assistance Sitting-balance support: No upper extremity supported;Feet supported   Sitting balance - Comments: pt able to sit EOB and use urinal independently   Standing balance support: Bilateral upper extremity supported Standing balance-Leahy Scale: Poor Standing balance comment: reliant on BUE support with rollator during static stance and ambulation                            Cognition Arousal/Alertness: Awake/alert Behavior During Therapy: WFL for tasks assessed/performed Overall Cognitive Status: No family/caregiver present to determine baseline cognitive functioning Area of Impairment: Safety/judgement                         Safety/Judgement: Decreased awareness of safety     General Comments: Pt demonstrated safer use of the rollator today; still required cues at the beginning of session to lock brakes and back up against wall before sitting, but did not need additional cues to  perform this throughout the rest of the session        Exercises      General Comments General comments (skin integrity, edema, etc.): 4L of O2 during session, SpO2 89% after ambulation, quickly recovered to 91 with sitting and pursed lip breathing. Pt uses pulse-ox to self monitor exertion. Pt stated he is mostly limited by fatigue in his legs when  walking. Educated him on the importance of continuing to walk and doing exercises such as sit to stands during commercial breaks to strengthen his LE. Pt says he does these often at home. Pt's daughter is taking him home from the hospital. He has one step to navigate into the house. Offered to practice stairs but he is confident he can do it with help from his daughter.      Pertinent Vitals/Pain Pain Assessment: Faces Faces Pain Scale: Hurts a little bit Pain Location: right ear (has a sore) Pain Descriptors / Indicators: Tender;Sore Pain Intervention(s): Other (comment) (notified RN)    Home Living                          Prior Function            PT Goals (current goals can now be found in the care plan section) Acute Rehab PT Goals Patient Stated Goal: return home PT Goal Formulation: With patient Time For Goal Achievement: 06/26/21 Potential to Achieve Goals: Good Progress towards PT goals: Progressing toward goals    Frequency    Min 3X/week      PT Plan Current plan remains appropriate    Co-evaluation              AM-PAC PT "6 Clicks" Mobility   Outcome Measure  Help needed turning from your back to your side while in a flat bed without using bedrails?: None Help needed moving from lying on your back to sitting on the side of a flat bed without using bedrails?: A Little Help needed moving to and from a bed to a chair (including a wheelchair)?: A Little Help needed standing up from a chair using your arms (e.g., wheelchair or bedside chair)?: A Little Help needed to walk in hospital room?: A Little Help needed climbing 3-5 steps with a railing? : A Little 6 Click Score: 19    End of Session Equipment Utilized During Treatment: Oxygen;Gait belt Activity Tolerance: Patient tolerated treatment well Patient left: in chair;with call bell/phone within reach Nurse Communication: Mobility status;Other (comment) (sore on R ear) PT Visit Diagnosis:  Other abnormalities of gait and mobility (R26.89);Muscle weakness (generalized) (M62.81);Difficulty in walking, not elsewhere classified (R26.2)     Time: 5462-7035 PT Time Calculation (min) (ACUTE ONLY): 26 min  Charges:  $Therapeutic Exercise: 8-22 mins $Self Care/Home Management: 8-22                     Brandon Melnick, SPT   Brandon Melnick 06/25/2021, 12:35 PM

## 2021-06-25 NOTE — Progress Notes (Signed)
Nutrition Follow-up  DOCUMENTATION CODES:   Morbid obesity  INTERVENTION:   -Continue -1 packet Juven BID, each packet provides 95 calories, 2.5 grams of protein (collagen), and 9.8 grams of carbohydrate (3 grams sugar); also contains 7 grams of L-arginine and L-glutamine, 300 mg vitamin C, 15 mg vitamin E, 1.2 mcg vitamin B-12, 9.5 mg zinc, 200 mg calcium, and 1.5 g  Calcium Beta-hydroxy-Beta-methylbutyrate to support wound healing  -Continue HS snack daily -MVI with minerals daily   NUTRITION DIAGNOSIS:   Increased nutrient needs related to chronic illness (acute on chronic CHF) as evidenced by estimated needs.  Ongoing  GOAL:   Patient will meet greater than or equal to 90% of their needs  Progressing   MONITOR:   PO intake, Supplement acceptance, Labs  REASON FOR ASSESSMENT:   Consult Other (Comment) (nutrition goals)  ASSESSMENT:   Pt from home. Admitted with SOB secondary to acute on chronic CHF. PMH includes afib, CAD, CKD IV, HTN, hyperlipidemia, COPD, hypothyroidism, obesity, non-hodgkin's lymphoma, T2DM, and OSA.  11/17- s/p rt heart heart cath 11/18- s/p TEE, cardioversion  Reviewed I/O's: -4 L x 24 hours and -22.7 L since admission  UOP: 5.1 L x 24 hours   Spoke with pt over the phone, who reports feeling well today. He continues to have a good appetite and shares he has consumed "just about all" of his meals. Documented meal completions 100%.   Per reports he continues to receive Juven supplements and his nighttime snack. He is agreeable to continue plan of care. He has no further nutritional concerns at this time.   Pt has experienced a 7.7% wt loss over the past week, but suspect this is related to diuresis.   Medications reviewed and include colace.   Labs reviewed: CBGS: 71-156 (inpatient orders for glycemic control are 0-15 units insulin aspart TID with meals, 0-5 units insulin aspart daily at bedtime, 3 units insulin aspart TID with meal,s and 33  units insulin-glargine-yfgn daily).    Diet Order:   Diet Order             Diet 2 gram sodium Room service appropriate? Yes; Fluid consistency: Thin  Diet effective now                   EDUCATION NEEDS:   Education needs have been addressed  Skin:  Skin Assessment: Skin Integrity Issues: Skin Integrity Issues:: Other (Comment) Other: venous stasis ulcer R leg  Last BM:  06/25/21  Height:   Ht Readings from Last 1 Encounters:  06/20/21 6\' 1"  (1.854 m)    Weight:   Wt Readings from Last 1 Encounters:  06/25/21 125 kg    Ideal Body Weight:  83.6 kg  BMI:  Body mass index is 36.36 kg/m.  Estimated Nutritional Needs:   Kcal:  1800-2000  Protein:  110-120g  Fluid:  >1.8L    Loistine Chance, RD, LDN, [place] Registered Dietitian II Certified Diabetes Care and Education Specialist Please refer to [facility] for RD and/or RD on-call/weekend/after hours pager

## 2021-06-25 NOTE — Progress Notes (Signed)
Patient ID: [name] [name], male   DOB: 11-29-1945, 75 y.o.   MRN: [id number]     Advanced Heart Failure Rounding Note  PCP-Cardiologist: Rozann Lesches, MD   Subjective:    Underwent TEE/DC-CV 11/18. EF 45-50%. Remains in NSR  Creatinine up to 3.13 (stable from yesterday) with BUN 124 (mildly lower).  Weight down again.   Left knee is improving, less painful.  Able to ambulate.   RHC 11/17: RA mean 10 RV 67/10 PA 69/22 PCWP mean 18 Oxygen saturations: PA 63% AO 99% Cardiac Output (Fick) 4.91  Cardiac Index (Fick) 1.94 PVR 4.1 WU PAPI 5.7  Objective:   Weight Range: 125 kg Body mass index is 36.36 kg/m.   Vital Signs:   Temp:  [97.3 F (36.3 C)-98 F (36.7 C)] 98 F (36.7 C) (11/23 0316) Pulse Rate:  [56-66] 56 (11/23 0316) Resp:  [14-21] 15 (11/23 0316) BP: (105-126)/(60-73) 117/66 (11/23 0316) SpO2:  [93 %-97 %] 97 % (11/23 0316) Weight:  [125 kg] 125 kg (11/23 0624) Last BM Date: 06/25/21  Weight change: Filed Weights   06/23/21 0334 06/24/21 0440 06/25/21 0624  Weight: 131.4 kg 128.3 kg 125 kg    Intake/Output:   Intake/Output Summary (Last 24 hours) at 06/25/2021 0747 Last data filed at 06/25/2021 9798 Gross per 24 hour  Intake 1071 ml  Output 4700 ml  Net -3629 ml      Physical Exam    General: NAD Neck: No JVD, no thyromegaly or thyroid nodule.  Lungs: Clear to auscultation bilaterally with normal respiratory effort. CV: Nondisplaced PMI.  Heart regular S1/S2, no S3/S4, no murmur.  Trace ankle edema.  Abdomen: Soft, nontender, no hepatosplenomegaly, no distention.  Skin: Intact without lesions or rashes.  Neurologic: Alert and oriented x 3.  Psych: Normal affect. Extremities: No clubbing or cyanosis.  HEENT: Normal.    Telemetry   NSR 60s personally reviewed.    Labs    CBC Recent Labs    06/23/21 0018  WBC 6.1  HGB 13.0  HCT 41.6  MCV 88.3  PLT 921*   Basic Metabolic Panel Recent Labs    06/24/21 0809  06/25/21 0207  NA 136 138  K 3.2* 3.4*  CL 90* 94*  CO2 33* 33*  GLUCOSE 104* 79  BUN 127* 124*  CREATININE 3.10* 3.13*  CALCIUM 9.1 9.0  MG 2.5*  --    Liver Function Tests No results for input(s): AST, ALT, ALKPHOS, BILITOT, PROT, ALBUMIN in the last 72 hours. No results for input(s): LIPASE, AMYLASE in the last 72 hours. Cardiac Enzymes No results for input(s): CKTOTAL, CKMB, CKMBINDEX, TROPONINI in the last 72 hours.  BNP: BNP (last 3 results) Recent Labs    01/30/21 0312 06/04/21 1820 06/11/21 1338  BNP 267.0* 446.0* 199.1*    ProBNP (last 3 results) No results for input(s): PROBNP in the last 8760 hours.   D-Dimer No results for input(s): DDIMER in the last 72 hours. Hemoglobin A1C No results for input(s): HGBA1C in the last 72 hours. Fasting Lipid Panel No results for input(s): CHOL, HDL, LDLCALC, TRIG, CHOLHDL, LDLDIRECT in the last 72 hours. Thyroid Function Tests No results for input(s): TSH, T4TOTAL, T3FREE, THYROIDAB in the last 72 hours.  Invalid input(s): FREET3  Other results:   Imaging    No results found.   Medications:     Scheduled Medications:  amiodarone  400 mg Oral Daily   apixaban  5 mg Oral BID   atorvastatin  10 mg  Oral Daily   cephALEXin  500 mg Oral Q12H   docusate sodium  100 mg Oral BID   insulin aspart  0-15 Units Subcutaneous TID WC   insulin aspart  0-5 Units Subcutaneous QHS   insulin aspart  3 Units Subcutaneous TID WC   insulin glargine-yfgn  33 Units Subcutaneous Q2200   isosorbide mononitrate  30 mg Oral Daily   levothyroxine  300 mcg Oral QAC breakfast   loratadine  5 mg Oral Daily   mometasone-formoterol  2 puff Inhalation BID   nutrition supplement (JUVEN)  1 packet Oral BID BM   potassium chloride  20 mEq Oral Once   sodium chloride flush  3 mL Intravenous Q12H   sodium chloride flush  3 mL Intravenous Q12H    Infusions:    PRN Medications: acetaminophen **OR** acetaminophen, albuterol, alum &  mag hydroxide-simeth, bisacodyl, chlorproMAZINE, hydrALAZINE, loperamide, morphine injection, ondansetron **OR** ondansetron (ZOFRAN) IV, oxyCODONE, polyethylene glycol, sodium chloride, temazepam    Assessment/Plan   1. Acute on chronic diastolic CHF: In setting of CKD stage IV and persistent atrial fibrillation.  Echo in 5/22 with EF 55-60%, mild RV enlargement, normal RV systolic function. Admitted with marked volume overload. TEE this admit with EF 45-50%, mildly dilated RV with mild RV dysfunction, mild AI. [place] 11/17 RA mean, PCWP mean 18, moderate to severe PAH with PVR 4 WU, CI low at 1.94. Lasix gtt stopped 11/18 and po torsemide started.  BUN/creatinine elevated today but stable compared to yesterday.  He is not volume overloaded now and weight still trending down.  - Hold torsemide today, would restart at 60 mg bid and renal indices improve.   - Would like to eventually get him on SGLT2 inhibitor when creatinine settles down.  2. Pulmonary hypertension:  Moderate to severe PAH with PVR 4 WU on RHC 06/19/21. 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 No chronic PE on V/Q scan.  - Not candidate for pulmonary vasodilators.  3. Atrial fibrillation: Persistent since 12/21, DCCV never attempted.  Suspect he will not stay in NSR without anti-arrhythmic. S/p DC-CV on 11/18. Remains in NSR this am.  - Bisoprolol stopped with bradycardia.  - Continue amio 400 mg daily x 2 weeks then 200 mg daily.  - Continue apixaban 5 mg bid.  4. CAD: S/p NSTEMI with occluded OM1 in 6/18, had DES to 80% stenosis mLAD at that time.  No chest pain. No coronary angiography in absence of ACS given CKD stage 4.  - Continue statin.  - No ASA given Eliquis use.  5. AKI on CKD: Stage IV. Baseline Scr 2.6-2.7.  Creatinine 3.13 today (stable). 6. Type 2 DM: Continue insulin.  7. Chronic hypoxemic respiratory failure: He is on 2L home oxygen.  ?OHS/OSA, ?COPD.  He smoked in the past but not heavily.  - Would get PFTs after  diuresis => will order today.  - Needs home sleep study.  8. Left knee pain: CT suggestive of diffuse tissue swelling ?cellulitis.  Uric acid 13.1 concerning for acute gout.  - Started on prednisone 40 mg X 3 days. No colchicine with CKD IV.  - management per TRH. Cephalexin started 11/19. AF.   Stable for home when creatinine starts trending down.  If better tomorrow, would send home on torsemide 60 mg bid (baseline creatinine around 2.7, 3.13 today).   Length of Stay: [place], MD  06/25/2021, 7:47 AM  Advanced Heart Failure Team Pager [phone] (M-F; 7a - 5p)  Please contact [facility] for night-coverage after hours (5p -7a ) and weekends on [email or web address]

## 2021-06-25 NOTE — Plan of Care (Signed)

## 2021-06-26 LAB — BASIC METABOLIC PANEL
Anion gap: 10 (ref 5–15)
BUN: 123 mg/dL — ABNORMAL HIGH (ref 8–23)
CO2: 34 mmol/L — ABNORMAL HIGH (ref 22–32)
Calcium: 9 mg/dL (ref 8.9–10.3)
Chloride: 93 mmol/L — ABNORMAL LOW (ref 98–111)
Creatinine, Ser: 3.06 mg/dL — ABNORMAL HIGH (ref 0.61–1.24)
GFR, Estimated: 21 mL/min — ABNORMAL LOW (ref >60)
Glucose, Bld: 88 mg/dL (ref 70–99)
Potassium: 3.5 mmol/L (ref 3.5–5.1)
Sodium: 137 mmol/L (ref 135–145)

## 2021-06-26 LAB — GLUCOSE, CAPILLARY
Glucose-Capillary: 95 mg/dL (ref 70–99)
Glucose-Capillary: 95 mg/dL (ref 70–99)

## 2021-06-26 MED ORDER — TORSEMIDE 20 MG PO TABS: 60.0000 mg | ORAL_TABLET | Freq: Two times a day (BID) | ORAL | Status: DC

## 2021-06-26 MED ORDER — AMIODARONE HCL 200 MG PO TABS: 200.0000 mg | ORAL_TABLET | Freq: Every day | ORAL | 2 refills | Status: DC

## 2021-06-26 MED ORDER — TORSEMIDE 20 MG PO TABS: 40.0000 mg | ORAL_TABLET | Freq: Two times a day (BID) | ORAL | Status: DC

## 2021-06-26 MED ORDER — TORSEMIDE 60 MG PO TABS: 60.0000 mg | ORAL_TABLET | Freq: Two times a day (BID) | ORAL | 2 refills | Status: DC

## 2021-06-26 NOTE — Progress Notes (Addendum)
Patient ID: [name] [name], male   DOB: 15-Feb-1946, 75 y.o.   MRN: [id number]     Advanced Heart Failure Rounding Note  PCP-Cardiologist: Rozann Lesches, MD   Subjective:    Prior updates: Underwent TEE/DC-CV 11/18. EF 45-50%.    [place] 11/17: RA mean 10 RV 67/10 PA 69/22 PCWP mean 18 Oxygen saturations: PA 63% AO 99% Cardiac Output (Fick) 4.91  Cardiac Index (Fick) 1.94 PVR 4.1 WU PAPI 5.7  Today, Remains in NSR  Creatinine is stable 3.06, seems to be his baseline since this summer  He feels he is closer to his baseline and feels much better than on admission  Objective:   Weight Range: 124.6 kg Body mass index is 36.24 kg/m.   Vital Signs:   Temp:  [97.3 F (36.3 C)-98.3 F (36.8 C)] 97.3 F (36.3 C) (11/24 0729) Pulse Rate:  [51-65] 63 (11/24 0729) Resp:  [12-19] 16 (11/24 0729) BP: (106-131)/(54-89) 106/54 (11/24 0729) SpO2:  [91 %-99 %] 98 % (11/24 0729) Weight:  [124.6 kg] 124.6 kg (11/24 0422) Last BM Date: 06/25/21  Weight change: Filed Weights   06/24/21 0440 06/25/21 0624 06/26/21 0422  Weight: 128.3 kg 125 kg 124.6 kg    Intake/Output:   Intake/Output Summary (Last 24 hours) at 06/26/2021 0747 Last data filed at 06/26/2021 0730 Gross per 24 hour  Intake 1140 ml  Output 2925 ml  Net -1785 ml      Physical Exam    Physical Exam Gen: sitting up in a chair on home O2 (3L) Neuro: alert and oriented CV: r,r,r no murmurs.  Vasc: 2+ radial pulses Pulm: normal work of breathing, CLAB Abd: non distended Ext: No LE edema Skin: warm and well perfused Psych: normal mood    Telemetry   Normal sinus rhythm, sinus bradycardia, episodes of bigeminy -personally reviewed.    Labs    CBC No results for input(s): WBC, NEUTROABS, HGB, HCT, MCV, PLT in the last 72 hours.  Basic Metabolic Panel Recent Labs    06/24/21 0809 06/25/21 0207 06/26/21 0152  NA 136 138 137  K 3.2* 3.4* 3.5  CL 90* 94* 93*  CO2 33* 33* 34*  GLUCOSE 104* 79  88  BUN 127* 124* 123*  CREATININE 3.10* 3.13* 3.06*  CALCIUM 9.1 9.0 9.0  MG 2.5*  --   --    Liver Function Tests No results for input(s): AST, ALT, ALKPHOS, BILITOT, PROT, ALBUMIN in the last 72 hours. No results for input(s): LIPASE, AMYLASE in the last 72 hours. Cardiac Enzymes No results for input(s): CKTOTAL, CKMB, CKMBINDEX, TROPONINI in the last 72 hours.  BNP: BNP (last 3 results) Recent Labs    01/30/21 0312 06/04/21 1820 06/11/21 1338  BNP 267.0* 446.0* 199.1*    ProBNP (last 3 results) No results for input(s): PROBNP in the last 8760 hours.   D-Dimer No results for input(s): DDIMER in the last 72 hours. Hemoglobin A1C No results for input(s): HGBA1C in the last 72 hours. Fasting Lipid Panel No results for input(s): CHOL, HDL, LDLCALC, TRIG, CHOLHDL, LDLDIRECT in the last 72 hours. Thyroid Function Tests No results for input(s): TSH, T4TOTAL, T3FREE, THYROIDAB in the last 72 hours.  Invalid input(s): FREET3  Other results:   Imaging    No results found.   Medications:     Scheduled Medications:  amiodarone  400 mg Oral Daily   apixaban  5 mg Oral BID   atorvastatin  10 mg Oral Daily   docusate sodium  100 mg Oral BID   insulin aspart  0-15 Units Subcutaneous TID WC   insulin aspart  0-5 Units Subcutaneous QHS   insulin aspart  3 Units Subcutaneous TID WC   insulin glargine-yfgn  33 Units Subcutaneous Q2200   isosorbide mononitrate  30 mg Oral Daily   levothyroxine  300 mcg Oral QAC breakfast   loratadine  5 mg Oral Daily   mometasone-formoterol  2 puff Inhalation BID   multivitamin with minerals  1 tablet Oral Daily   nutrition supplement (JUVEN)  1 packet Oral BID BM   sodium chloride flush  3 mL Intravenous Q12H   sodium chloride flush  3 mL Intravenous Q12H    Infusions:    PRN Medications: acetaminophen **OR** acetaminophen, albuterol, alum & mag hydroxide-simeth, bisacodyl, chlorproMAZINE, hydrALAZINE, loperamide, morphine  injection, ondansetron **OR** ondansetron (ZOFRAN) IV, oxyCODONE, polyethylene glycol, sodium chloride, temazepam    Assessment/Plan   #Acute on chronic diastolic CHF: In setting of CKD stage IV and persistent atrial fibrillation.  Echo in 5/22 with EF 55-60%, mild RV enlargement, normal RV systolic function. Admitted with marked volume overload. TEE this admit with EF 45-50%, mildly dilated RV with mild RV dysfunction, mild AI. [place] 11/17 RA mean, PCWP mean 18, moderate to severe PAH with PVR 4 WU, CI low at 1.94. Lasix gtt stopped 11/18 and po torsemide started.  BUN/creatinine elevated today but stable compared to yesterday.  He is not volume overloaded now and weight still trending down.  - Hold torsemide 11/23, would restart at 60 mg bid and renal indices improve.   - Would like to eventually get him on SGLT2 inhibitor when creatinine settles down, this can be done as an outpatient   #Pulmonary hypertension:  Moderate to severe PAH with PVR 4 WU on [facility] 06/19/21. 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 No chronic PE on V/Q scan.  - Not candidate for pulmonary vasodilators.   #Atrial fibrillation: Persistent since 12/21, DCCV never attempted.  Suspect he will not stay in NSR without anti-arrhythmic. S/p DC-CV on 11/18. Remains in NSR this am.  - Bisoprolol stopped with bradycardia.  - Continue amio 400 mg daily x 2 weeks then 200 mg daily.  - Continue apixaban 5 mg bid.   #CAD: S/p NSTEMI with occluded OM1 in 6/18, had DES to 80% stenosis mLAD at that time.  No chest pain. No coronary angiography in absence of ACS given CKD stage 4.  - Continue statin.   - continue imdur 30 mg daily  - No ASA given Eliquis use.   #AKI on CKD: Stage IV. Baseline Scr 2.6-2.7.  Creatinine stable near baseline  #Chronic hypoxemic respiratory failure: He is on 2L home oxygen.  ?OHS/OSA, ?COPD.  He smoked in the past but not heavily.  - PFTs ordered, can be completed outpatient - Needs home sleep study.     Today's Recommendations: - appears relatively euvolemic; crt trending in the right direction ~ baseline GFR -pt 98 on 3L can wean to 2L (on home O2) - can restart new home torsemide 60 mg BID (ordered) -Remains in sinus rhythm - if able to ambulate, continues to feel at baseline we are ok with discharge today - We arranged cardiology follow-up  Length of Stay: 37  Janina Mayo, MD  06/26/2021, 7:47 AM  Advanced Heart Failure Team Pager [phone] (M-F; 7a - 5p)  Please contact [facility] for night-coverage after hours (5p -7a ) and weekends on [email or web address]

## 2021-06-26 NOTE — Plan of Care (Signed)

## 2021-06-26 NOTE — Discharge Summary (Signed)
Physician Discharge Summary  [name] [name] BTD:[id number] DOB: 1945/09/20 DOA: 06/11/2021  PCP: Nickola Major, MD  Admit date: 06/11/2021 Discharge date: 06/26/2021  Time spent: 60 minutes  Recommendations for Outpatient Follow-up:  Follow-up cardiology as outpatient Patient to be discharged with home health PT   Discharge Diagnoses:  Principal Problem:   Acute on chronic diastolic CHF (congestive heart failure) ([place]) Active Problems:   Chronic kidney disease Stage IV   Morbid obesity ([place])   Type 2 diabetes mellitus with stage 4 chronic kidney disease (HCC)   Hypothyroidism   Benign essential hypertension   AF (paroxysmal atrial fibrillation) (HCC)   Chronic respiratory failure with hypoxia and hypercapnia (HCC)   CHF (congestive heart failure) ([place])   Discharge Condition: Stable  Diet recommendation: Heart healthy diet  Filed Weights   06/24/21 0440 06/25/21 0624 06/26/21 0422  Weight: 128.3 kg 125 kg 124.6 kg    History of present illness:  75 year old male with past medical history of atrial fibrillation, CAD s/p stenting, CKD stage IIIb, essential hypertension, morbid obesity, non-Hodgkin's lymphoma treated with chemotherapy and radiation treatment, diabetes mellitus type 2, OSA came with shortness of breath due to acute diastolic heart failure.  Advanced heart failure team was consulted and patient started on IV Lasix infusion.  Underwent TEE/DC cardioversion on 06/20/2021  Hospital Course:   Acute on chronic diastolic heart failure -Patient underwent right heart cath on 06/19/2021 -TEE on 06/20/2021 showed EF 45% -VQ scan showed no evidence of PE -Patient started on IV Lasix drip and metolazone, transition to oral torsemide -Diuresed well with above regimen -Net -22 L -Diuretics have been restarted by cardiology -Continue torsemide 60 mg p.o. twice daily     Persistent atrial fibrillation -Patient underwent cardioversion on 06/20/2021, started on  amiodarone -Beta-blocker was stopped due to bradycardia -Continue anticoagulation with Eliquis -Cardiology recommends-Continue amio 400 mg daily x 2 weeks then 200 mg daily.      Moderate to severe pulmonary hypertension -Likely from OSA/OHS -VQ scan negative for chronic PEs   CKD stage IV -Baseline creatinine of 2.6 -Creatinine went up to 3.32 with diuresis, diuretics on hold at this time -Creatinine is down to 3.06 today.   Possible OSA -Will need sleep study as outpatient   Hypertension -Continue Imdur, torsemide   Diabetes mellitus type 2 -A1c is 5.8 -Continue Lantus 25 units subcu daily   Hypothyroidism -Continue Synthroid      Procedures: TEE Cardiac catheterization  Consultations: Cardiology  Discharge Exam: Vitals:   06/26/21 0846 06/26/21 1146  BP:  (!) 103/58  Pulse:  64  Resp:  14  Temp:  (!) 97.3 F (36.3 C)  SpO2: 98% 98%    General: Appears in no acute distress Cardiovascular: S1-S2, regular, no murmur auscultated Respiratory: Clear to auscultation bilaterally  Discharge Instructions   Discharge Instructions     Amb Referral to HF Clinic   Complete by: As directed    Diet - low sodium heart healthy   Complete by: As directed    Discharge instructions   Complete by: As directed    Take amiodarone 400 mg p.o. daily for 8 more days till 07-04-21, then continue taking amiodarone 200 mg p.o. daily.   Increase activity slowly   Complete by: As directed       Allergies as of 06/26/2021   No Known Allergies      Medication List     STOP taking these medications    bisoprolol 5 MG tablet Commonly known  as: ZEBETA   furosemide 40 MG tablet Commonly known as: LASIX       TAKE these medications    acetaminophen 325 MG tablet Commonly known as: TYLENOL Take 2 tablets (650 mg total) by mouth every 6 (six) hours as needed for mild pain (or Fever >/= 101).   amiodarone 200 MG tablet Commonly known as: Pacerone Take 1 tablet  (200 mg total) by mouth daily. Take 2 tabs (400 mg) daily till 07/04/21, then continue taking 200 mg po daily   Eliquis 5 MG Tabs tablet Generic drug: apixaban Take 1 tablet (5 mg total) by mouth 2 (two) times daily. This is a dose change What changed: additional instructions   fluticasone 50 MCG/ACT nasal spray Commonly known as: FLONASE Place 2 sprays into both nostrils daily.   ipratropium-albuterol 0.5-2.5 (3) MG/3ML Soln Commonly known as: DUONEB Inhale 3 mLs into the lungs every 4 (four) hours as needed (shortness of breath).   isosorbide mononitrate 30 MG 24 hr tablet Commonly known as: IMDUR Take 1 tablet (30 mg total) by mouth daily.   levocetirizine 5 MG tablet Commonly known as: XYZAL Take 5 mg by mouth daily.   levothyroxine 200 MCG tablet Commonly known as: SYNTHROID Take 300 mcg by mouth daily before breakfast. (takes with 128mcg tab for a total of 360mcg)   levothyroxine 100 MCG tablet Commonly known as: SYNTHROID Take 100 mcg by mouth daily before breakfast. (Takes with 200 mcg tab for a total of 300 mcg once daily)   nitroGLYCERIN 0.4 MG SL tablet Commonly known as: NITROSTAT Place 0.4 mg under the tongue every 5 (five) minutes as needed for chest pain.   OXYGEN Inhale 3-4 L into the lungs continuous.   potassium chloride 10 MEQ tablet Commonly known as: KLOR-CON Take 1 tablet (10 mEq total) by mouth daily.   senna-docusate 8.6-50 MG tablet Commonly known as: Senokot-S Take 1 tablet by mouth daily as needed for mild constipation.   simvastatin 5 MG tablet Commonly known as: ZOCOR Take 5 mg by mouth daily.   sodium chloride 0.65 % Soln nasal spray Commonly known as: OCEAN Place 1 spray into both nostrils as needed for congestion.   Symbicort 160-4.5 MCG/ACT inhaler Generic drug: budesonide-formoterol Inhale 2 puffs into the lungs daily as needed (shortness of breath).   Torsemide 60 MG Tabs Take 60 mg by mouth 2 (two) times daily. What  changed:  medication strength how much to take   Tresiba FlexTouch 200 UNIT/ML FlexTouch Pen Generic drug: insulin degludec Inject 25 Units into the skin daily. What changed: Another medication with the same name was removed. Continue taking this medication, and follow the directions you see here.               Durable Medical Equipment  (From admission, onward)           Start     Ordered   06/24/21 0948  For home use only DME Tub bench  Once        06/24/21 0947   06/24/21 0947  For home use only DME Shower stool  Once       Comments: Patient has a tub   06/24/21 0947           No Known Allergies  Follow-up Information     [facility] Follow up.   Specialty: Home Health Services Why: Home Health RN and Physical Therapy-agency will call to arrange appts Contact information: [address] [place] [address] [phone]          [facility] Follow up on 07/01/2021.   Specialty: Cardiology Why: at 2:30 in [facility] [place]. Contact information: [address] [id number] [place] [phone]                 The results of significant diagnostics from this hospitalization (including imaging, microbiology, ancillary and laboratory) are listed below for reference.    Significant Diagnostic Studies: DG Chest 1 View  Result Date: 06/20/2021 CLINICAL DATA:  Shortness of breath. EXAM: CHEST  1 VIEW COMPARISON:  06/11/2021 FINDINGS: Heart size remains stable. Decreased interstitial infiltrates are seen since previous study, consistent with decreased interstitial edema. Previously seen small bilateral pleural effusions have also resolved. IMPRESSION: Decreased pulmonary interstitial edema and pleural effusions, consistent with resolving congestive heart failure. Electronically Signed   By: Marlaine Hind M.D.   On:  06/20/2021 14:18   NM Pulmonary Perfusion  Result Date: 06/20/2021 CLINICAL DATA:  Shortness of breath. EXAM: NUCLEAR MEDICINE PERFUSION LUNG SCAN TECHNIQUE: Perfusion images were obtained in multiple projections after intravenous injection of radiopharmaceutical. Ventilation scans intentionally deferred if perfusion scan and chest x-ray adequate for interpretation during COVID 19 epidemic. RADIOPHARMACEUTICALS:  4.4 mCi Tc-23m MAA IV COMPARISON:  January 20, 2021 FINDINGS: No segmental perfusion abnormalities identified. IMPRESSION: No evidence of pulmonary embolus on this study. Electronically Signed   By: Dorise Bullion III M.D.   On: 06/20/2021 15:30   CARDIAC CATHETERIZATION  Result Date: 06/19/2021 1. Right and left heart filling pressures are mildly elevated. 2. Moderate-severe PAH with PVR 4 WU. 3. Low cardiac index, 1.94. Will continue diuresis with IV Lasix infusion today but hold off on metolazone with rising creatinine and filling pressures not markedly high. PH is likely from OHS/OSA, but will arrange for V/Q scan to rule out chronic PE.   DG Chest Portable 1 View  Result Date: 06/11/2021 CLINICAL DATA:  Shortness of breath. EXAM: PORTABLE CHEST 1 VIEW COMPARISON:  06/04/2021 FINDINGS: 1344 hours. Low volume lordotic film. The cardio pericardial silhouette is enlarged. Diffuse interstitial opacity again noted. Bibasilar atelectasis/infiltrate again noted with tiny bilateral pleural effusions. The visualized bony structures of the thorax show no acute abnormality. Telemetry leads overlie the chest. IMPRESSION: Bibasilar atelectasis/infiltrate with tiny bilateral pleural effusions. Probable component of interstitial edema. Electronically Signed   By: Misty Stanley M.D.   On: 06/11/2021 13:52   DG Chest Port 1 View  Result Date: 06/04/2021 CLINICAL DATA:  Short of breath EXAM: PORTABLE CHEST 1 VIEW COMPARISON:  01/30/2021 FINDINGS: Cardiac enlargement. Pulmonary vascularity with mild  interstitial edema. Small bilateral effusions and bibasilar atelectasis. IMPRESSION: Congestive heart failure with mild interstitial edema and small bilateral effusions. Bibasilar atelectasis. Electronically Signed   By: Franchot Gallo M.D.   On: 06/04/2021 18:51   ECHOCARDIOGRAM COMPLETE  Result Date: 06/19/2021    ECHOCARDIOGRAM REPORT   Patient Name:   [name] [name] Date of Exam: 06/19/2021 Medical Rec #:  [id number]     Height:       73.0 in Accession #:    [id number]    Weight:       292.3 lb Date of Birth:  [birth year]     BSA:          2.528 m Patient Age:    33 years      BP:  102/67 mmHg Patient Gender: M             HR:           47 bpm. Exam Location:  Inpatient Procedure: 2D Echo, Cardiac Doppler, Color Doppler and Intracardiac            Opacification Agent Indications:    CHF-Acute Diastolic E83.15  History:        Patient has prior history of Echocardiogram examinations, most                 recent 12/28/2020. CAD and Previous Myocardial Infarction,                 Arrythmias:LBBB and Atrial Fibrillation; Risk                 Factors:Dyslipidemia, Hypertension, Sleep Apnea and Diabetes.                 Thyroid disease.  Sonographer:    Darlina Sicilian RDCS Referring Phys: [place]  1. Left ventricular ejection fraction, by estimation, is 45 to 50%. The left ventricle has mildly decreased function. The left ventricle demonstrates global hypokinesis. The left ventricular internal cavity size was severely dilated. Left ventricular diastolic function could not be evaluated.  2. Right ventricular systolic function is moderately reduced. The right ventricular size is not well visualized. There is moderately elevated pulmonary artery systolic pressure.  3. Left atrial size was moderately dilated.  4. The mitral valve is grossly normal. Trivial mitral valve regurgitation. No evidence of mitral stenosis.  5. The aortic valve is grossly normal. There is mild calcification of  the aortic valve. Aortic valve regurgitation is trivial. Aortic valve sclerosis is present, with no evidence of aortic valve stenosis.  6. Aortic dilatation noted. There is borderline dilatation of the ascending aorta, measuring 39 mm.  7. The inferior vena cava is dilated in size with <50% respiratory variability, suggesting right atrial pressure of 15 mmHg. Comparison(s): Changes from prior study are noted. EF slightly decreased (globally) compared to prior. Conclusion(s)/Recommendation(s): Significant bradycardia, down to 40 bpm, noted during study. FINDINGS  Left Ventricle: Left ventricular ejection fraction, by estimation, is 45 to 50%. The left ventricle has mildly decreased function. The left ventricle demonstrates global hypokinesis. Definity contrast agent was given IV to delineate the left ventricular  endocardial borders. The left ventricular internal cavity size was severely dilated. There is no left ventricular hypertrophy. Left ventricular diastolic function could not be evaluated due to atrial fibrillation. Left ventricular diastolic function could not be evaluated. Right Ventricle: The right ventricular size is not well visualized. Right vetricular wall thickness was not well visualized. Right ventricular systolic function is moderately reduced. There is moderately elevated pulmonary artery systolic pressure. The tricuspid regurgitant velocity is 3.07 m/s, and with an assumed right atrial pressure of 15 mmHg, the estimated right ventricular systolic pressure is 17.6 mmHg. Left Atrium: Left atrial size was moderately dilated. Right Atrium: Right atrial size was not well visualized. Pericardium: There is no evidence of pericardial effusion. Mitral Valve: The mitral valve is grossly normal. Trivial mitral valve regurgitation. No evidence of mitral valve stenosis. Tricuspid Valve: The tricuspid valve is grossly normal. Tricuspid valve regurgitation is trivial. No evidence of tricuspid stenosis. Aortic  Valve: The aortic valve is grossly normal. There is mild calcification of the aortic valve. Aortic valve regurgitation is trivial. Aortic valve sclerosis is present, with no evidence of aortic valve stenosis. Pulmonic Valve: The pulmonic  valve was not well visualized. Pulmonic valve regurgitation is trivial. No evidence of pulmonic stenosis. Aorta: Aortic dilatation noted. There is borderline dilatation of the ascending aorta, measuring 39 mm. Venous: The inferior vena cava is dilated in size with less than 50% respiratory variability, suggesting right atrial pressure of 15 mmHg. IAS/Shunts: The interatrial septum was not well visualized.  LEFT VENTRICLE PLAX 2D LVIDd:         6.40 cm LVIDs:         4.90 cm LV PW:         1.00 cm LV IVS:        1.10 cm LVOT diam:     2.30 cm LV SV:         99 LV SV Index:   39 LVOT Area:     4.15 cm  LV Volumes (MOD) LV vol d, MOD A2C: 162.0 ml LV vol d, MOD A4C: 183.0 ml LV vol s, MOD A2C: 95.1 ml LV vol s, MOD A4C: 84.9 ml LV SV MOD A2C:     66.9 ml LV SV MOD A4C:     183.0 ml LV SV MOD BP:      83.6 ml RIGHT VENTRICLE RV S prime:     6.98 cm/s TAPSE (M-mode): 1.3 cm LEFT ATRIUM              Index LA diam:        4.90 cm  1.94 cm/m LA Vol (A2C):   68.8 ml  27.21 ml/m LA Vol (A4C):   104.0 ml 41.13 ml/m LA Biplane Vol: 90.7 ml  35.87 ml/m  AORTIC VALVE LVOT Vmax:   124.00 cm/s LVOT Vmean:  96.900 cm/s LVOT VTI:    0.238 m  AORTA Ao Root diam: 2.70 cm Ao Asc diam:  3.90 cm MITRAL VALVE               TRICUSPID VALVE MV Area (PHT): 3.61 cm    TR Peak grad:   37.7 mmHg MV Decel Time: 210 msec    TR Vmax:        307.00 cm/s MV E velocity: 92.95 cm/s                            SHUNTS                            Systemic VTI:  0.24 m                            Systemic Diam: 2.30 cm Buford Dresser MD Electronically signed by Buford Dresser MD Signature Date/Time: 06/19/2021/4:34:56 PM    Final    CT EXTREMITY LOWER LEFT WO CONTRAST  Result Date: 06/20/2021 CLINICAL  DATA:  Left lower leg pain. EXAM: CT OF THE LOWER LEFT EXTREMITY WITHOUT CONTRAST TECHNIQUE: Multidetector CT imaging of the lower left extremity was performed according to the standard protocol. COMPARISON:  Left knee x-rays dated March 09, 2011. FINDINGS: Bones/Joint/Cartilage Prior left total knee arthroplasty. No evidence of hardware failure or loosening. No fracture or dislocation. Joint spaces are preserved. Small knee joint effusion. Ligaments Ligaments are suboptimally evaluated by CT. Muscles and Tendons Scattered lower leg muscle atrophy, most prominently involving the medial gastrocnemius muscle. Soft tissue Diffuse circumferential soft tissue swelling of the left lower leg. No fluid collection or subcutaneous emphysema. No soft  tissue mass. IMPRESSION: 1. Diffuse circumferential soft tissue swelling of the left lower leg, nonspecific, but can be seen with cellulitis. No abscess. 2. Prior left total knee arthroplasty without evidence of hardware complication. Electronically Signed   By: Titus Dubin M.D.   On: 06/20/2021 15:09    Microbiology: No results found for this or any previous visit (from the past 240 hour(s)).   Labs: Basic Metabolic Panel: Recent Labs  Lab 06/20/21 0101 06/21/21 0048 06/22/21 0044 06/23/21 0018 06/24/21 0809 06/25/21 0207 06/26/21 0152  NA 138   < > 136 134* 136 138 137  K 3.4*   < > 4.2 3.9 3.2* 3.4* 3.5  CL 96*   < > 95* 93* 90* 94* 93*  CO2 32   < > 31 29 33* 33* 34*  GLUCOSE 118*   < > 165* 130* 104* 79 88  BUN 102*   < > 109* 114* 127* 124* 123*  CREATININE 3.32*   < > 3.07* 3.07* 3.10* 3.13* 3.06*  CALCIUM 9.2   < > 8.6* 8.7* 9.1 9.0 9.0  MG 2.4  --   --   --  2.5*  --   --    < > = values in this interval not displayed.   Liver Function Tests: No results for input(s): AST, ALT, ALKPHOS, BILITOT, PROT, ALBUMIN in the last 168 hours. No results for input(s): LIPASE, AMYLASE in the last 168 hours. No results for input(s): AMMONIA in the last  168 hours. CBC: Recent Labs  Lab 06/23/21 0018  WBC 6.1  HGB 13.0  HCT 41.6  MCV 88.3  PLT 108*   Cardiac Enzymes: No results for input(s): CKTOTAL, CKMB, CKMBINDEX, TROPONINI in the last 168 hours. BNP: BNP (last 3 results) Recent Labs    01/30/21 0312 06/04/21 1820 06/11/21 1338  BNP 267.0* 446.0* 199.1*    ProBNP (last 3 results) No results for input(s): PROBNP in the last 8760 hours.  CBG: Recent Labs  Lab 06/25/21 1059 06/25/21 1636 06/25/21 2110 06/26/21 0628 06/26/21 1142  GLUCAP 136* 91 83 95 95       Signed:  Oswald Hillock MD.  [facility] 06/26/2021, 2:14 PM

## 2021-06-30 NOTE — Progress Notes (Incomplete)
PCP: Dr Daron Offer Primary HF Cardiologist: Dr Aundra Dubin   HPI: [name] [name] is a 75 year old with history of  chronic diastolic CHF, chronic hypoxemic respiratory failure/COPD, CKD stage 4, and persistent atrial fibrillation.  He has had multiple admissions this year for CHF.  He appears to have been in atrial fibrillation since around 12/21, DCCV never attempted.  He had NSTEMI in 6/18, culprit lesion was occluded OM1 that was not intervened on, he had DES to 80% mLAD.  No recent chest pain.  He has CKD stage 4, creatinine has been in the 2.5 range recently.  He is on 2 L home oxygen.  Carries history of COPD but never had PFTs.  ?OHS/OSA.  Most recent echo in 5/22 with EF 55-60%, mild RV enlargement, normal RV systolic function.    He was admitted on 11/9 with weight gain and marked edema as well as hypoxemia.  He was treated with prednisone for 5 days for ?component of COPD.  He was started on Lasix 80 mg IV bid.  He has had some diuresis but not vigorous.  Creatinine has remained fairly stalbe around 2.4-2.5.   Hospital course c/b A fib and AKI. Had Cath with Underwent TEE//DC-CV with restoration of NSR.   Today he returns for post hospital follow up. Overall feeling fine. Denies SOB/PND/Orthopnea. Appetite ok. No fever or chills. Weight at home  pounds. Taking all medications     RHC 06/19/21: RA mean 10 RV 67/10 PA 69/22 PCWP mean 18 Oxygen saturations: PA 63% AO 99% Cardiac Output (Fick) 4.91  Cardiac Index (Fick) 1.94 PVR 4.1 WU PAPI 5.7   PMH: 1. CKD stage 4 2. OSA: Not on CPAP at home.  3. Type 2 diabetes 4. H/o non-Hodgkins lymphoma: Treated with XRT and chemotherapy.  5. Hypothyroidism 6. Atrial fibrillation: Persistent, first noted in 12/21.  7. COPD: Prior smoker, never had PFTs.  8. Chronic hypoxemic respiratory failure: On 2 L home oxygen.  9. CAD: NSTEMI 6/18 with cath showing occluded OM1 (culprit) and 80% mLAD.  He had DES to mLAD.  10. Chronic diastolic CHF: Echo in  0/16 with EF 55-60%, mild RV enlargement, normal RV systolic function ROS: All systems negative except as listed in HPI, PMH and Problem List.  SH:  Social History   Socioeconomic History   Marital status: Widowed    Spouse name: Not on file   Number of children: 1   Years of education: Not on file   Highest education level: Some college, no degree  Occupational History   Occupation: retired    Comment: truck Geophysicist/field seismologist  Tobacco Use   Smoking status: Former    Packs/day: 1.00    Years: 30.00    Pack years: 30.00    Types: Cigarettes    Quit date: 10/2019    Years since quitting: 1.7   Smokeless tobacco: Never  Vaping Use   Vaping Use: Never used  Substance and Sexual Activity   Alcohol use: No    Alcohol/week: 0.0 standard drinks   Drug use: No   Sexual activity: Yes  Other Topics Concern   Not on file  Social History Narrative   Not on file   Social Determinants of Health   Financial Resource Strain: Low Risk    Difficulty of Paying Living Expenses: Not very hard  Food Insecurity: No Food Insecurity   Worried About [place] in the Last Year: Never true   Onset in the Last  Year: Never true  Transportation Needs: No Transportation Needs   Lack of Transportation (Medical): No   Lack of Transportation (Non-Medical): No  Physical Activity: Not on file  Stress: Not on file  Social Connections: Not on file  Intimate Partner Violence: Not At Risk   Fear of Current or Ex-Partner: No   Emotionally Abused: No   Physically Abused: No   Sexually Abused: No    FH:  Family History  Problem Relation Age of Onset   Cancer Mother        breast and lung   Cancer Father        bladder   Cancer Maternal Uncle        prostate   Cancer Paternal Uncle        esophagus   Colon cancer Neg Hx     Past Medical History:  Diagnosis Date   Atrial fibrillation ([place])    CAD (coronary artery disease)    DES to LAD June 2018   CKD (chronic kidney disease)  stage 3, GFR 30-59 ml/min (HCC)    Essential hypertension    History of pneumonia    Hyperlipidemia    Hypothyroidism    LBBB (left bundle branch block)    Morbid obesity ([place])    Non Hodgkin's lymphoma ([place])    Status post XRT and chemotherapy   NSTEMI (non-ST elevated myocardial infarction) ([place])    June 2018   Peripheral neuropathy    Pneumonia due to COVID-19 virus    Sleep apnea    Type 2 diabetes mellitus (HCC)     Current Outpatient Medications  Medication Sig Dispense Refill   acetaminophen (TYLENOL) 325 MG tablet Take 2 tablets (650 mg total) by mouth every 6 (six) hours as needed for mild pain (or Fever >/= 101). 30 tablet 1   amiodarone (PACERONE) 200 MG tablet Take 1 tablet (200 mg total) by mouth daily. Take 2 tabs (400 mg) daily till 07/04/21, then continue taking 200 mg po daily 60 tablet 2   apixaban (ELIQUIS) 5 MG TABS tablet Take 1 tablet (5 mg total) by mouth 2 (two) times daily. This is a dose change (Patient taking differently: Take 5 mg by mouth 2 (two) times daily.) 60 tablet 6   fluticasone (FLONASE) 50 MCG/ACT nasal spray Place 2 sprays into both nostrils daily.     insulin degludec (TRESIBA FLEXTOUCH) 200 UNIT/ML FlexTouch Pen Inject 25 Units into the skin daily.     ipratropium-albuterol (DUONEB) 0.5-2.5 (3) MG/3ML SOLN Inhale 3 mLs into the lungs every 4 (four) hours as needed (shortness of breath).     isosorbide mononitrate (IMDUR) 30 MG 24 hr tablet Take 1 tablet (30 mg total) by mouth daily. 30 tablet 6   levocetirizine (XYZAL) 5 MG tablet Take 5 mg by mouth daily.     levothyroxine (SYNTHROID) 100 MCG tablet Take 100 mcg by mouth daily before breakfast. (Takes with 200 mcg tab for a total of 300 mcg once daily)     levothyroxine (SYNTHROID, LEVOTHROID) 200 MCG tablet Take 300 mcg by mouth daily before breakfast. (takes with 121mcg tab for a total of 36mcg)     nitroGLYCERIN (NITROSTAT) 0.4 MG SL tablet Place 0.4 mg under the tongue every 5 (five) minutes  as needed for chest pain.   11   OXYGEN Inhale 3-4 L into the lungs continuous.     potassium chloride SA (KLOR-CON) 10 MEQ tablet Take 1 tablet (10 mEq total) by mouth daily. (Patient  not taking: No sig reported) 30 tablet 0   senna-docusate (SENOKOT-S) 8.6-50 MG tablet Take 1 tablet by mouth daily as needed for mild constipation.     simvastatin (ZOCOR) 5 MG tablet Take 5 mg by mouth daily.     sodium chloride (OCEAN) 0.65 % SOLN nasal spray Place 1 spray into both nostrils as needed for congestion.     SYMBICORT 160-4.5 MCG/ACT inhaler Inhale 2 puffs into the lungs daily as needed (shortness of breath).     torsemide 60 MG TABS Take 60 mg by mouth 2 (two) times daily. 60 tablet 2   No current facility-administered medications for this visit.    There were no vitals filed for this visit.  PHYSICAL EXAM:  General:  Well appearing. No resp difficulty HEENT: normal Neck: supple. JVP flat. Carotids 2+ bilaterally; no bruits. No lymphadenopathy or thryomegaly appreciated. Cor: PMI normal. Regular rate & rhythm. No rubs, gallops or murmurs. Lungs: clear Abdomen: soft, nontender, nondistended. No hepatosplenomegaly. No bruits or masses. Good bowel sounds. Extremities: no cyanosis, clubbing, rash, edema Neuro: alert & orientedx3, cranial nerves grossly intact. Moves all 4 extremities w/o difficulty. Affect pleasant.   ECG:   ASSESSMENT & PLAN: 1.Chronic diastolic CHF: In setting of CKD stage IV and persistent atrial fibrillation.  Echo in 5/22 with EF 55-60%, mild RV enlargement, normal RV systolic function. Admitted with marked volume overload. TEE this admit with EF 45-50%, mildly dilated RV with mild RV dysfunction, mild AI. [place] 11/17 RA mean, PCWP mean 18, moderate to severe PAH with PVR 4 WU, CI low at 1.94. NYHA   60 mg bid and renal indices improve.   - Would like to eventually get him on SGLT2 inhibitor when creatinine settles down.  2. Pulmonary hypertension:  Moderate to severe  PAH with PVR 4 WU on RHC 06/19/21. 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 No chronic PE on V/Q scan.  - Not candidate for pulmonary vasodilators.  3. Atrial fibrillation: Persistent since 12/21, DCCV never attempted.  Suspect he will not stay in NSR without anti-arrhythmic. S/p DC-CV on 11/18. - EKG today isoprolol stopped with bradycardia.  - Continue amio 400 mg daily x 2 weeks then 200 mg daily.  - Continue apixaban 5 mg bid.  4. CAD: S/p NSTEMI with occluded OM1 in 6/18, had DES to 80% stenosis mLAD at that time.  No chest pain. No coronary angiography in absence of ACS given CKD stage 4.  - Continue statin.  - No ASA given Eliquis use.  5. CKD: Stage IV. Baseline Scr 2.6-2.7.  Creatinine 3.13 today (stable). 6. Type 2 DM: Continue insulin.  7. Chronic hypoxemic respiratory failure: He is on 2L home oxygen.  ?OHS/OSA, ?COPD.  He smoked in the past but not heavily.  - Would get PFTs after diuresis => will order today.  - Needs home sleep study.  8. Gout : CT suggestive of diffuse tissue swelling  - Uric acid 13.1 concerning for acute gout.  -

## 2021-07-01 ENCOUNTER — Encounter (HOSPITAL_COMMUNITY): Payer: Medicare HMO

## 2021-07-03 ENCOUNTER — Encounter (HOSPITAL_COMMUNITY): Payer: Medicare HMO

## 2021-07-08 ENCOUNTER — Telehealth (HOSPITAL_COMMUNITY): Payer: Self-pay

## 2021-07-08 ENCOUNTER — Encounter (HOSPITAL_COMMUNITY): Payer: Medicare HMO

## 2021-07-08 NOTE — Telephone Encounter (Signed)
Call attempted to confirm HV TOC appt today at 3pm. HIPPA appropriate VM left with callback number.   Pricilla Holm, MSN, RN Heart Failure Nurse Navigator [phone]

## 2021-07-10 ENCOUNTER — Encounter (HOSPITAL_COMMUNITY): Payer: Medicare HMO

## 2021-07-15 ENCOUNTER — Encounter (HOSPITAL_COMMUNITY): Payer: Self-pay

## 2021-07-15 ENCOUNTER — Other Ambulatory Visit: Payer: Self-pay

## 2021-07-15 ENCOUNTER — Ambulatory Visit (HOSPITAL_BASED_OUTPATIENT_CLINIC_OR_DEPARTMENT_OTHER)
Admission: RE | Admit: 2021-07-15 | Discharge: 2021-07-15 | Disposition: A | Discharge status: Home / Self Care | Payer: Medicare HMO | Source: Ambulatory Visit | Attending: Cardiology | Admitting: Cardiology

## 2021-07-15 ENCOUNTER — Ambulatory Visit (HOSPITAL_COMMUNITY)
Admission: RE | Admit: 2021-07-15 | Discharge: 2021-07-15 | Disposition: A | Discharge status: Home / Self Care | Payer: Medicare HMO | Source: Ambulatory Visit | Attending: Cardiology | Admitting: Cardiology

## 2021-07-15 VITALS — BP 100/62 | HR 72 | Wt 290.8 lb

## 2021-07-15 DIAGNOSIS — Z87891 Personal history of nicotine dependence: Secondary | ICD-10-CM | POA: Insufficient documentation

## 2021-07-15 DIAGNOSIS — N184 Chronic kidney disease, stage 4 (severe): Secondary | ICD-10-CM | POA: Diagnosis not present

## 2021-07-15 DIAGNOSIS — I252 Old myocardial infarction: Secondary | ICD-10-CM | POA: Diagnosis not present

## 2021-07-15 DIAGNOSIS — Z9981 Dependence on supplemental oxygen: Secondary | ICD-10-CM | POA: Insufficient documentation

## 2021-07-15 DIAGNOSIS — I272 Pulmonary hypertension, unspecified: Secondary | ICD-10-CM | POA: Insufficient documentation

## 2021-07-15 DIAGNOSIS — J9691 Respiratory failure, unspecified with hypoxia: Secondary | ICD-10-CM | POA: Insufficient documentation

## 2021-07-15 DIAGNOSIS — Z794 Long term (current) use of insulin: Secondary | ICD-10-CM | POA: Diagnosis not present

## 2021-07-15 DIAGNOSIS — E1122 Type 2 diabetes mellitus with diabetic chronic kidney disease: Secondary | ICD-10-CM | POA: Insufficient documentation

## 2021-07-15 DIAGNOSIS — I4819 Other persistent atrial fibrillation: Secondary | ICD-10-CM | POA: Diagnosis not present

## 2021-07-15 DIAGNOSIS — I5032 Chronic diastolic (congestive) heart failure: Secondary | ICD-10-CM | POA: Diagnosis present

## 2021-07-15 DIAGNOSIS — Z09 Encounter for follow-up examination after completed treatment for conditions other than malignant neoplasm: Secondary | ICD-10-CM | POA: Diagnosis not present

## 2021-07-15 DIAGNOSIS — I2510 Atherosclerotic heart disease of native coronary artery without angina pectoris: Secondary | ICD-10-CM | POA: Insufficient documentation

## 2021-07-15 LAB — BASIC METABOLIC PANEL
Anion gap: 11 (ref 5–15)
BUN: 31 mg/dL — ABNORMAL HIGH (ref 8–23)
CO2: 26 mmol/L (ref 22–32)
Calcium: 9.1 mg/dL (ref 8.9–10.3)
Chloride: 104 mmol/L (ref 98–111)
Creatinine, Ser: 2.7 mg/dL — ABNORMAL HIGH (ref 0.61–1.24)
GFR, Estimated: 24 mL/min — ABNORMAL LOW (ref >60)
Glucose, Bld: 91 mg/dL (ref 70–99)
Potassium: 3.6 mmol/L (ref 3.5–5.1)
Sodium: 141 mmol/L (ref 135–145)

## 2021-07-15 LAB — BRAIN NATRIURETIC PEPTIDE: B Natriuretic Peptide: 100.1 pg/mL — ABNORMAL HIGH (ref 0.0–100.0)

## 2021-07-15 MED ORDER — TORSEMIDE 20 MG PO TABS: 40.0000 mg | ORAL_TABLET | Freq: Two times a day (BID) | ORAL | 6 refills | Status: DC

## 2021-07-15 NOTE — Patient Instructions (Addendum)
INCREASE Torsemide to 40 mg (two tabs) twice a day  Labs today We will only contact you if something comes back abnormal or we need to make some changes. Otherwise no news is good news!  Your physician has recommended that you have a pulmonary function test. Pulmonary Function Tests are a group of tests that measure how well air moves in and out of your lungs. -see instruction sheet  Your physician has recommended that you have a  home sleep study. This test records several body functions during sleep, including: brain activity, eye movement, oxygen and carbon dioxide blood levels, heart rate and rhythm, breathing rate and rhythm, the flow of air through your mouth and nose, snoring, body muscle movements, and chest and belly movement.   Your physician recommends that you schedule a follow-up appointment in: 4-6 weeks with Dr Aundra Dubin  Do the following things EVERYDAY: Weigh yourself in the morning before breakfast. Write it down and keep it in a log. Take your medicines as prescribed Eat low salt foods--Limit salt (sodium) to 2000 mg per day.  Stay as active as you can everyday Limit all fluids for the day to less than 2 liters  At the [facility], you and your health needs are our priority. As part of our continuing mission to provide you with exceptional heart care, we have created designated Provider Care Teams. These Care Teams include your primary Cardiologist (physician) and Advanced Practice Providers (APPs- Physician Assistants and Nurse Practitioners) who all work together to provide you with the care you need, when you need it.   You may see any of the following providers on your designated Care Team at your next follow up: Dr Glori Bickers Dr Haynes Kerns, NP Lyda Jester, [place] [facility] [place], [place] Audry Riles, PharmD   Please be sure to bring in all your medications bottles to every appointment.    If you have any  questions or concerns before your next appointment please send [place] a message through [place] or call our office at [phone].    TO LEAVE A MESSAGE FOR THE NURSE SELECT OPTION 2, PLEASE LEAVE A MESSAGE INCLUDING: YOUR NAME DATE OF BIRTH CALL BACK NUMBER REASON FOR CALL**this is important as we prioritize the call backs  YOU WILL RECEIVE A CALL BACK THE SAME DAY AS LONG AS YOU CALL BEFORE 4:00 PM

## 2021-07-15 NOTE — Progress Notes (Signed)
PCP: Dr Daron Offer Primary HF Cardiologist: Dr Aundra Dubin    Reason for Visit: [facility] f/u for chronic combined systolic and diastolic heart failure and PAF    HPI: [name] [name] is a 75 year old with history of  chronic diastolic CHF, chronic hypoxemic respiratory failure/COPD, CKD stage 4, and persistent atrial fibrillation.  He has had multiple admissions this year for CHF.  He appears to have been in atrial fibrillation since around 12/21, DCCV never attempted.  He had NSTEMI in 6/18, culprit lesion was occluded OM1 that was not intervened on, he had DES to 80% mLAD.  No recent chest pain.  He has CKD stage 4, creatinine has been in the 2.5 range recently.  He is on 2 L home oxygen.  Carries history of COPD but never had PFTs.  ?OHS/OSA.  Most recent echo in 5/22 with EF 55-60%, mild RV enlargement, normal RV systolic function.    He was admitted on 11/9 with weight gain and marked edema as well as hypoxemia.  He was treated with prednisone for 5 days for ?component of COPDE.  Hospital course c/b A fib and AKI. He was started on Lasix 80 mg IV bid. RHC showed mildly elevated right and left heart filling pressures, mod-severe PAH w/ PVR 4 WU and low cardiac index, 1.94. He was diuresed further. Creatinine plateaued ~3.0. Underwent TEE//DC-CV with restoration of NSR. EF on TEE 45-50%, RV mildly reduced.  He was discharged home on torsemide 60 mg bid. Amio 400 mg daily x 2 weeks then 200 mg daily (outpatient PFTs recommended) + Eliquis for a/c. D/c wt 274 lb.    Today he returns for post hospital follow up. Wt is up 16 lb by our scale but 6 lb difference in his home scale. His wt at home today w/o clothing ~284 lb. He had post hospital f/u w/ his nephrologist in [place] on 11/30 and torsemide was reduced to 40 mg qam/ 20 mg qpm. He has 2+ bilateral LEE on exam. Remains on 4L [place] which is his home baseline. Comfortable on current level of support. No resting dyspnea. Not very active at baseline. Uses walker around his  house, NYHA Class III symptoms (stable). EKG today shows NSR w/ 1st degree AV Block. HR well controlled in 70s. Denies any symptoms of breakthrough Afib. No palpitations. BP well controlled. Reports full med compliance. No abnormal bleeding w/ Eliquis. No falls.        RHC 06/19/21: RA mean 10 RV 67/10 PA 69/22 PCWP mean 18 Oxygen saturations: PA 63% AO 99% Cardiac Output (Fick) 4.91  Cardiac Index (Fick) 1.94 PVR 4.1 WU PAPI 5.7   2D Echo 06/19/21:  Left ventricular ejection fraction, by estimation, is 45 to 50%. The left ventricle has mildly decreased function. The left ventricle demonstrates global hypokinesis. The left ventricular internal cavity size was severely dilated. Left ventricular diastolic function could not be evaluated. 1. Right ventricular systolic function is moderately reduced. The right ventricular size is not well visualized. There is moderately elevated pulmonary artery systolic pressure. 2. 3. Left atrial size was moderately dilated. The mitral valve is grossly normal. Trivial mitral valve regurgitation. No evidence of mitral stenosis. 4. The aortic valve is grossly normal. There is mild calcification of the aortic valve. Aortic valve regurgitation is trivial. Aortic valve sclerosis is present, with no evidence of aortic valve stenosis. 5. Aortic dilatation noted. There is borderline dilatation of the ascending aorta, measuring 39 mm. 6. The inferior vena cava is dilated  in size with <50% respiratory variability, suggesting right atrial pressure of 15 mmHg.     PMH: 1. CKD stage 4 2. OSA: Not on CPAP at home.  3. Type 2 diabetes 4. H/o non-Hodgkins lymphoma: Treated with XRT and chemotherapy.  5. Hypothyroidism 6. Atrial fibrillation: Persistent, first noted in 12/21.  7. COPD: Prior smoker, never had PFTs.  8. Chronic hypoxemic respiratory failure: On 2 L home oxygen.  9. CAD: NSTEMI 6/18 with cath showing occluded OM1 (culprit) and 80% mLAD.   He had DES to mLAD.  10. Chronic diastolic CHF: Echo in 4/16 with EF 55-60%, mild RV enlargement, normal RV systolic function ROS: All systems negative except as listed in HPI, PMH and Problem List.   SH:  Social History         Socioeconomic History   Marital status: Widowed      Spouse name: Not on file   Number of children: 1   Years of education: Not on file   Highest education level: Some college, no degree  Occupational History   Occupation: retired      Comment: truck Geophysicist/field seismologist  Tobacco Use   Smoking status: Former      Packs/day: 1.00      Years: 30.00      Pack years: 30.00      Types: Cigarettes      Quit date: 10/2019      Years since quitting: 1.7   Smokeless tobacco: Never  Vaping Use   Vaping Use: Never used  Substance and Sexual Activity   Alcohol use: No      Alcohol/week: 0.0 standard drinks   Drug use: No   Sexual activity: Yes  Other Topics Concern   Not on file  Social History Narrative   Not on file    Social Determinants of Health       Financial Resource Strain: Low Risk    Difficulty of Paying Living Expenses: Not very hard  Food Insecurity: No Food Insecurity   Worried About [place] in the Last Year: Never true   [place] in the Last Year: Never true  Transportation Needs: No Transportation Needs   Lack of Transportation (Medical): No   Lack of Transportation (Non-Medical): No  Physical Activity: Not on file  Stress: Not on file  Social Connections: Not on file  Intimate Partner Violence: Not At Risk   Fear of Current or Ex-Partner: No   Emotionally Abused: No   Physically Abused: No   Sexually Abused: No      FH:       Family History  Problem Relation Age of Onset   Cancer Mother          breast and lung   Cancer Father          bladder   Cancer Maternal Uncle          prostate   Cancer Paternal Uncle          esophagus   Colon cancer Neg Hx            Past Medical History:  Diagnosis Date   Atrial  fibrillation ([place])     CAD (coronary artery disease)      DES to LAD June 2018   CKD (chronic kidney disease) stage 3, GFR 30-59 ml/min (HCC)     Essential hypertension     History of pneumonia     Hyperlipidemia     Hypothyroidism  LBBB (left bundle branch block)     Morbid obesity ([place])     Non Hodgkin's lymphoma ([place])      Status post XRT and chemotherapy   NSTEMI (non-ST elevated myocardial infarction) [facility])      June 2018   Peripheral neuropathy     Pneumonia due to COVID-19 virus     Sleep apnea     Type 2 diabetes mellitus (HCC)              Current Outpatient Medications  Medication Sig Dispense Refill   acetaminophen (TYLENOL) 325 MG tablet Take 2 tablets (650 mg total) by mouth every 6 (six) hours as needed for mild pain (or Fever >/= 101). 30 tablet 1   amiodarone (PACERONE) 200 MG tablet Take 200 mg by mouth daily.       apixaban (ELIQUIS) 5 MG TABS tablet Take 1 tablet (5 mg total) by mouth 2 (two) times daily. This is a dose change 60 tablet 6   fluticasone (FLONASE) 50 MCG/ACT nasal spray Place 2 sprays into both nostrils daily.       insulin degludec (TRESIBA FLEXTOUCH) 200 UNIT/ML FlexTouch Pen Inject 25 Units into the skin daily.       ipratropium-albuterol (DUONEB) 0.5-2.5 (3) MG/3ML SOLN Inhale 3 mLs into the lungs every 4 (four) hours as needed (shortness of breath).       isosorbide mononitrate (IMDUR) 30 MG 24 hr tablet Take 1 tablet (30 mg total) by mouth daily. 30 tablet 6   levocetirizine (XYZAL) 5 MG tablet Take 5 mg by mouth daily.       levothyroxine (SYNTHROID) 100 MCG tablet Take 100 mcg by mouth daily before breakfast. (Takes with 200 mcg tab for a total of 300 mcg once daily)       levothyroxine (SYNTHROID, LEVOTHROID) 200 MCG tablet Take 300 mcg by mouth daily before breakfast. (takes with 173mcg tab for a total of 395mcg)       nitroGLYCERIN (NITROSTAT) 0.4 MG SL tablet Place 0.4 mg under the tongue every 5 (five) minutes as needed for chest  pain.    11   OXYGEN Inhale 3-4 L into the lungs continuous.       potassium chloride SA (KLOR-CON) 10 MEQ tablet Take 1 tablet (10 mEq total) by mouth daily. 30 tablet 0   senna-docusate (SENOKOT-S) 8.6-50 MG tablet Take 1 tablet by mouth daily as needed for mild constipation.       simvastatin (ZOCOR) 5 MG tablet Take 5 mg by mouth daily.       sodium chloride (OCEAN) 0.65 % SOLN nasal spray Place 1 spray into both nostrils as needed for congestion.       SYMBICORT 160-4.5 MCG/ACT inhaler Inhale 2 puffs into the lungs daily as needed (shortness of breath).       TORSEMIDE PO 60 mg. Patient takes 2 tablets in the morning and 1 tablet in the evening.        No current facility-administered medications for this encounter.         Vitals:    07/15/21 1506  BP: 100/62  Pulse: 72      PHYSICAL EXAM:   General:  obese, chronically ill appearing elderly WM, in WC. No respiratory difficulty HEENT: normal Neck: supple. JVD ~8 cm. Carotids 2+ bilat; no bruits. No lymphadenopathy or thyromegaly appreciated. Cor: PMI nondisplaced. Regular rate & rhythm. No rubs, gallops or murmurs. Lungs: decreased BS at the bases bilaterally  Abdomen: obese, soft,  nontender, nondistended. No hepatosplenomegaly. No bruits or masses. Good bowel sounds. Extremities: no cyanosis, clubbing, rash, 1-2+ bilateral pretibial edema Neuro: alert & oriented x 3, cranial nerves grossly intact. moves all 4 extremities w/o difficulty. Affect pleasant.     ECG:NSR w/ 1st degree AVB, 70 bpm.      ASSESSMENT & PLAN: 1. Chronic Diastolic CHF: Echo in 0/93 with EF 55-60%, mild RV enlargement, normal RV systolic function. Recent admit for a/c CHF 11/22, in setting of persistent atrial fibrillation, c/b CKD Stage IV.  TEE with EF 45-50%, mildly dilated RV with mild RV dysfunction, mild AI. [place] 11/17 RA mean, PCWP mean 18, moderate to severe PAH with PVR 4 WU, CI low at 1.94. Diuresed w/ IV Lasix. Transitioned to Torsemide 60 mg  bid. D/c wt 274 lb. - volume status up post hospital, Wt up 274>>290 lb (only up 10 lb on home scale). Reports nephrologist reduced torsemide to 40 qam/20 qpm  -NYHA Class III (stable) - Will increase torsemide to 40 mg bid  - BMP and BNP today and repeat in 7 days  - Would like to eventually get him on SGLT2 inhibitor when creatinine settles down.  - discussed low sodium diet + fluid restriction, LE elevation and continued use of TED hoses  2. Pulmonary hypertension:  Moderate to severe PAH with PVR 4 WU on RHC 06/19/21. 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 No chronic PE on V/Q scan.  - Not candidate for pulmonary vasodilators.  - Needs sleep study, will order  - order PFTs  - continue home O2  3. Persistent Atrial fibrillation: Had been in persistent Afib from 12/21-11/22. S/p DC-CV on 06/20/21 after amio load. Bisoprolol stopped with bradycardia.  - EKG today shows NSR w/ 1st degree AVB, 70 bpm  - Reduce amiodarone to 200 mg daily  - Continue apixaban 5 mg bid. Denies abnormal bleeding  4. CAD: S/p NSTEMI with occluded OM1 in 6/18, had DES to 80% stenosis mLAD at that time.  No chest pain. No coronary angiography in absence of ACS given CKD stage 4.  - Continue statin.  - No ASA given Eliquis use.  5. CKD: Stage IV. Previous baseline Scr 2.6-2.7.  New baseline SCr ~ 3.0 from most recent hospital admit - check BMP today  - he is followed by nephrology in [place]  6. Type 2 DM: Continue insulin.  7. Chronic hypoxemic respiratory failure: He is on 2L home oxygen.  ?OHS/OSA, ?COPD.  He smoked in the past but not heavily.  - Needs PFTs, especially now that he is on amiodarone. Order placed, needs to schedule  - Needs home sleep study.   F/u w/ Dr. Aundra Dubin in 4-6 wks    [name] Jester, PA-C 07/15/2021

## 2021-07-15 NOTE — Progress Notes (Signed)
Patient Name: [name] [name]        DOB: 12/01/1945      Height: 6'1"    Weight: 298  Office Name: [facility], PA/ [name], MD  Today's Date:07/15/2021   STOP BANG RISK ASSESSMENT S (snore) Have you been told that you snore?     YES   T (tired) Are you often tired, fatigued, or sleepy during the day?   NO  O (obstruction) Do you stop breathing, choke, or gasp during sleep? NO   P (pressure) Do you have or are you being treated for high blood pressure? YES   B (BMI) Is your body index greater than 35 kg/m? YES   A (age) Are you 13 years old or older? YES   N (neck) Do you have a neck circumference greater than 16 inches?   NO   G (gender) Are you a male? YES   TOTAL STOP/BANG YES ANSWERS                                                                        For Office Use Only              Procedure Order Form    YES to 3+ Stop Bang questions OR two clinical symptoms - patient qualifies for WatchPAT (CPT 24235)             Clinical Notes: Will consult Sleep Specialist and refer for management of therapy due to patient increased risk of Sleep Apnea. Ordering a sleep study due to the following two clinical symptoms: / Loud snoring R06.83 History of high blood pressure R03.0    I understand that I am proceeding with a home sleep apnea test as ordered by my treating physician. I understand that untreated sleep apnea is a serious cardiovascular risk factor and it is my responsibility to perform the test and seek management for sleep apnea. I will be contacted with the results and be managed for sleep apnea by a local sleep physician. I will be receiving equipment and further instructions from [facility]. I shall promptly ship back the equipment via the included mailing label. I understand my insurance will be billed for the test and as the patient I am responsible for any insurance related out-of-pocket costs incurred. I have been  provided with written instructions and can call for additional video or telephonic instruction, with 24-hour availability of qualified personnel to answer any questions: Patient Help Desk [phone].  Patient Signature ______________________________________________________   Date______________________ Patient Telemedicine Verbal Consent

## 2021-07-15 NOTE — Progress Notes (Signed)
°  Date:  07/15/2021 STOP BANG RISK ASSESSMENT S (snore) Have you been told that you snore?     YES   T (tired) Are you often tired, fatigued, or sleepy during the day?   YES  O (obstruction) Do you stop breathing, choke, or gasp during sleep? NO   P (pressure) Do you have or are you being treated for high blood pressure? YES   B (BMI) Is your body index greater than 35 kg/m? YES   A (age) Are you 74 years old or older? NO   N (neck) Do you have a neck circumference greater than 16 inches?   YES   G (gender) Are you a male? YES   TOTAL STOP/BANG YES ANSWERS 6                                                                       For Office Use Only              Procedure Order Form    YES to 3+ Stop Bang questions OR two clinical symptoms - patient qualifies for WatchPAT (CPT 00923)            Patient given home sleep device per order along with instuctions both verbal and written for home use.  He downloaded WatchPAT app onto his phone and acknowledges understanding.  I encouraged him to complete the study as soon as possible.  He will call with any questions.

## 2021-07-15 NOTE — Progress Notes (Incomplete)
PCP: Dr Daron Offer Primary HF Cardiologist: Dr Aundra Dubin   Reason for Visit: [facility] f/u for chronic combined systolic and diastolic heart failure and PAF   HPI: [name] [name] is a 75 year old with history of  chronic diastolic CHF, chronic hypoxemic respiratory failure/COPD, CKD stage 4, and persistent atrial fibrillation.  He has had multiple admissions this year for CHF.  He appears to have been in atrial fibrillation since around 12/21, DCCV never attempted.  He had NSTEMI in 6/18, culprit lesion was occluded OM1 that was not intervened on, he had DES to 80% mLAD.  No recent chest pain.  He has CKD stage 4, creatinine has been in the 2.5 range recently.  He is on 2 L home oxygen.  Carries history of COPD but never had PFTs.  ?OHS/OSA.  Most recent echo in 5/22 with EF 55-60%, mild RV enlargement, normal RV systolic function.    He was admitted on 11/9 with weight gain and marked edema as well as hypoxemia.  He was treated with prednisone for 5 days for ?component of COPD.  Hospital course c/b A fib and AKI. He was started on Lasix 80 mg IV bid. RHC showed mildly elevated right and left heart filling pressures, mod-severe PAH w/ PVR 4 WU and low cardiac index, 1.94. He was diuresed further. Creatinine plateaued ~3.0. Underwent TEE//DC-CV with restoration of NSR. He was discharged home on torsemide 60 mg bid. Amio 400 mg daily x 2 weeks then 200 mg daily (outpatient PFTs recommended) + Eliquis for a/c. D/c wt 274 lb.   Today he returns for post hospital follow up.       RHC 06/19/21: RA mean 10 RV 67/10 PA 69/22 PCWP mean 18 Oxygen saturations: PA 63% AO 99% Cardiac Output (Fick) 4.91  Cardiac Index (Fick) 1.94 PVR 4.1 WU PAPI 5.7  2D Echo 06/19/21:  Left ventricular ejection fraction, by estimation, is 45 to 50%. The left ventricle has mildly decreased function. The left ventricle demonstrates global hypokinesis. The left ventricular internal cavity size was severely dilated. Left  ventricular diastolic function could not be evaluated. 1. Right ventricular systolic function is moderately reduced. The right ventricular size is not well visualized. There is moderately elevated pulmonary artery systolic pressure. 2. 3. Left atrial size was moderately dilated. The mitral valve is grossly normal. Trivial mitral valve regurgitation. No evidence of mitral stenosis. 4. The aortic valve is grossly normal. There is mild calcification of the aortic valve. Aortic valve regurgitation is trivial. Aortic valve sclerosis is present, with no evidence of aortic valve stenosis. 5. Aortic dilatation noted. There is borderline dilatation of the ascending aorta, measuring 39 mm. 6. The inferior vena cava is dilated in size with <50% respiratory variability, suggesting right atrial pressure of 15 mmHg.   PMH: 1. CKD stage 4 2. OSA: Not on CPAP at home.  3. Type 2 diabetes 4. H/o non-Hodgkins lymphoma: Treated with XRT and chemotherapy.  5. Hypothyroidism 6. Atrial fibrillation: Persistent, first noted in 12/21.  7. COPD: Prior smoker, never had PFTs.  8. Chronic hypoxemic respiratory failure: On 2 L home oxygen.  9. CAD: NSTEMI 6/18 with cath showing occluded OM1 (culprit) and 80% mLAD.  He had DES to mLAD.  10. Chronic diastolic CHF: Echo in 1/69 with EF 55-60%, mild RV enlargement, normal RV systolic function ROS: All systems negative except as listed in HPI, PMH and Problem List.  SH:  Social History   Socioeconomic History   Marital status: Widowed  Spouse name: Not on file   Number of children: 1   Years of education: Not on file   Highest education level: Some college, no degree  Occupational History   Occupation: retired    Comment: truck Geophysicist/field seismologist  Tobacco Use   Smoking status: Former    Packs/day: 1.00    Years: 30.00    Pack years: 30.00    Types: Cigarettes    Quit date: 10/2019    Years since quitting: 1.7   Smokeless tobacco: Never  Vaping Use    Vaping Use: Never used  Substance and Sexual Activity   Alcohol use: No    Alcohol/week: 0.0 standard drinks   Drug use: No   Sexual activity: Yes  Other Topics Concern   Not on file  Social History Narrative   Not on file   Social Determinants of Health   Financial Resource Strain: Low Risk    Difficulty of Paying Living Expenses: Not very hard  Food Insecurity: No Food Insecurity   Worried About Charity fundraiser in the Last Year: Never true   [place] in the Last Year: Never true  Transportation Needs: No Transportation Needs   Lack of Transportation (Medical): No   Lack of Transportation (Non-Medical): No  Physical Activity: Not on file  Stress: Not on file  Social Connections: Not on file  Intimate Partner Violence: Not At Risk   Fear of Current or Ex-Partner: No   Emotionally Abused: No   Physically Abused: No   Sexually Abused: No    FH:  Family History  Problem Relation Age of Onset   Cancer Mother        breast and lung   Cancer Father        bladder   Cancer Maternal Uncle        prostate   Cancer Paternal Uncle        esophagus   Colon cancer Neg Hx     Past Medical History:  Diagnosis Date   Atrial fibrillation ([place])    CAD (coronary artery disease)    DES to LAD June 2018   CKD (chronic kidney disease) stage 3, GFR 30-59 ml/min (HCC)    Essential hypertension    History of pneumonia    Hyperlipidemia    Hypothyroidism    LBBB (left bundle branch block)    Morbid obesity ([place])    Non Hodgkin's lymphoma ([place])    Status post XRT and chemotherapy   NSTEMI (non-ST elevated myocardial infarction) ([place])    June 2018   Peripheral neuropathy    Pneumonia due to COVID-19 virus    Sleep apnea    Type 2 diabetes mellitus (HCC)     Current Outpatient Medications  Medication Sig Dispense Refill   acetaminophen (TYLENOL) 325 MG tablet Take 2 tablets (650 mg total) by mouth every 6 (six) hours as needed for mild pain (or Fever >/= 101). 30  tablet 1   amiodarone (PACERONE) 200 MG tablet Take 200 mg by mouth daily.     apixaban (ELIQUIS) 5 MG TABS tablet Take 1 tablet (5 mg total) by mouth 2 (two) times daily. This is a dose change 60 tablet 6   fluticasone (FLONASE) 50 MCG/ACT nasal spray Place 2 sprays into both nostrils daily.     insulin degludec (TRESIBA FLEXTOUCH) 200 UNIT/ML FlexTouch Pen Inject 25 Units into the skin daily.     ipratropium-albuterol (DUONEB) 0.5-2.5 (3) MG/3ML SOLN Inhale 3 mLs into  the lungs every 4 (four) hours as needed (shortness of breath).     isosorbide mononitrate (IMDUR) 30 MG 24 hr tablet Take 1 tablet (30 mg total) by mouth daily. 30 tablet 6   levocetirizine (XYZAL) 5 MG tablet Take 5 mg by mouth daily.     levothyroxine (SYNTHROID) 100 MCG tablet Take 100 mcg by mouth daily before breakfast. (Takes with 200 mcg tab for a total of 300 mcg once daily)     levothyroxine (SYNTHROID, LEVOTHROID) 200 MCG tablet Take 300 mcg by mouth daily before breakfast. (takes with 196mcg tab for a total of 33mcg)     nitroGLYCERIN (NITROSTAT) 0.4 MG SL tablet Place 0.4 mg under the tongue every 5 (five) minutes as needed for chest pain.   11   OXYGEN Inhale 3-4 L into the lungs continuous.     potassium chloride SA (KLOR-CON) 10 MEQ tablet Take 1 tablet (10 mEq total) by mouth daily. 30 tablet 0   senna-docusate (SENOKOT-S) 8.6-50 MG tablet Take 1 tablet by mouth daily as needed for mild constipation.     simvastatin (ZOCOR) 5 MG tablet Take 5 mg by mouth daily.     sodium chloride (OCEAN) 0.65 % SOLN nasal spray Place 1 spray into both nostrils as needed for congestion.     SYMBICORT 160-4.5 MCG/ACT inhaler Inhale 2 puffs into the lungs daily as needed (shortness of breath).     TORSEMIDE PO 60 mg. Patient takes 2 tablets in the morning and 1 tablet in the evening.     No current facility-administered medications for this encounter.    Vitals:   07/15/21 1506  BP: 100/62  Pulse: 72    PHYSICAL  EXAM:  General:  Well appearing. No respiratory difficulty HEENT: normal Neck: supple. no JVD. Carotids 2+ bilat; no bruits. No lymphadenopathy or thyromegaly appreciated. Cor: PMI nondisplaced. Regular rate & rhythm. No rubs, gallops or murmurs. Lungs: clear Abdomen: soft, nontender, nondistended. No hepatosplenomegaly. No bruits or masses. Good bowel sounds. Extremities: no cyanosis, clubbing, rash, edema Neuro: alert & oriented x 3, cranial nerves grossly intact. moves all 4 extremities w/o difficulty. Affect pleasant.   ECG:   ASSESSMENT & PLAN: 1. Chronic Diastolic CHF: Echo in 4/85 with EF 55-60%, mild RV enlargement, normal RV systolic function. Recent admit for a/c CHF 11/22, in setting of persistent atrial fibrillation, c/b CKD Stage IV.  TEE with EF 45-50%, mildly dilated RV with mild RV dysfunction, mild AI. [place] 11/17 RA mean, PCWP mean 18, moderate to severe PAH with PVR 4 WU, CI low at 1.94. Diuresed w/ IV Lasix. Transitioned to Torsemide 60 mg bid. D/c wt 274 lb. - volume status up post hospital, Wt up 274>>290 lb. Taking torsemide incorrectly, only taking 40 qam/20 qpm at home.  - Would like to eventually get him on SGLT2 inhibitor when creatinine settles down. Will repeat BMP today  - worry about med compliance/ understanding. Will refer to paramedicine of [place]  2. Pulmonary hypertension:  Moderate to severe PAH with PVR 4 WU on RHC 06/19/21. 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 No chronic PE on V/Q scan.  - Not candidate for pulmonary vasodilators.  3. Persistent Atrial fibrillation: Had been in persistent since 12/21-11/22. S/p DC-CV on 11/18 after amio load. Bisoprolol stopped with bradycardia.  - EKG today shows - Reduce amiodarone to 200 mg daily  - Continue apixaban 5 mg bid.  4. CAD: S/p NSTEMI with occluded OM1 in 6/18, had DES to 80% stenosis mLAD  at that time.  No chest pain. No coronary angiography in absence of ACS given CKD stage 4.  - Continue statin.   - No ASA given Eliquis use.  5. CKD: Stage IV. Previous baseline Scr 2.6-2.7.  New baseline SCr ~ 3.0 from most recent hospital admit - check BMP today  6. Type 2 DM: Continue insulin.  7. Chronic hypoxemic respiratory failure: He is on 2L home oxygen.  ?OHS/OSA, ?COPD.  He smoked in the past but not heavily.  - Needs PFTs, especially now that he is on amiodarone. Order placed, needs to schedule  - Needs home sleep study.   [name] Jester, PA-C 07/15/2021

## 2021-07-16 ENCOUNTER — Encounter (HOSPITAL_BASED_OUTPATIENT_CLINIC_OR_DEPARTMENT_OTHER): Payer: Medicare HMO | Admitting: Cardiology

## 2021-07-16 DIAGNOSIS — G4733 Obstructive sleep apnea (adult) (pediatric): Secondary | ICD-10-CM

## 2021-07-18 ENCOUNTER — Other Ambulatory Visit (HOSPITAL_COMMUNITY)
Admission: RE | Admit: 2021-07-18 | Discharge: 2021-07-18 | Disposition: A | Discharge status: Home / Self Care | Payer: Medicare HMO | Source: Ambulatory Visit | Attending: Cardiology | Admitting: Cardiology

## 2021-07-18 DIAGNOSIS — Z01818 Encounter for other preprocedural examination: Secondary | ICD-10-CM

## 2021-07-22 ENCOUNTER — Other Ambulatory Visit (HOSPITAL_COMMUNITY): Payer: Medicare HMO

## 2021-07-22 ENCOUNTER — Inpatient Hospital Stay (HOSPITAL_COMMUNITY): Admission: RE | Admit: 2021-07-22 | Payer: Medicare HMO | Source: Ambulatory Visit

## 2021-07-26 NOTE — Procedures (Signed)
° ° °  Sleep Study Report  Patient Information Study Date: 07/16/21 Patient Name: [name] [name] Patient ID: [id number] Birth Date: 2045-12-18 Age: 75 Gender: Male Referring Physician:Dalton Aundra Dubin, MD  TEST DESCRIPTION: Home sleep apnea testing was completed using the WatchPat, a Type 1 device, utilizing peripheral arterial tonometry (PAT), chest movement, actigraphy, pulse oximetry, pulse rate, body position and snore. AHI was calculated with apnea and hypopnea using valid sleep time as the denominator. RDI includes apneas, hypopneas, and RERAs. The data acquired and the scoring of sleep and all associated events were performed in accordance with the recommended standards and specifications as outlined in the AASM Manual for the Scoring of Sleep and Associated Events 2.2.0 (2015).  FINDINGS: 1. Mild Obstructive Sleep Apnea with AHI 6.2hr. 2. No Central Sleep Apnea with pAHIc 0.6/hr. 3. Oxygen desaturations as low as 87%. 4. Minimal snoring was present. O2 sats were < 88% for 0.1 min. 5. Total sleep time was 6 hrs and 57 min. 6. 22.5% of total sleep time was spent in REM sleep. 7. Shortened sleep onset latency at 5 min. 8. Shortened REM sleep onset latency at 54 min. 9. Total awakenings were 8.  DIAGNOSIS: Mild Obstructive Sleep Apnea (G47.33)  RECOMMENDATIONS: 1. Clinical correlation of these findings is necessary. The decision to treat obstructive sleep apnea (OSA) is usually based on the presence of apnea symptoms or the presence of associated medical conditions such as Hypertension, Congestive Heart Failure, Atrial Fibrillation or Obesity. The most common symptoms of OSA are snoring, gasping for breath while sleeping, daytime sleepiness and fatigue.  2. Initiating apnea therapy is recommended given the presence of symptoms and/or associated conditions. Recommend proceeding with one of the following:   a. Auto-CPAP therapy with a pressure range of 5-20cm H2O.   b. An oral  appliance (OA) that can be obtained from certain dentists with expertise in sleep medicine. These are primarily of use in non-obese patients with mild and moderate disease.   c. An ENT consultation which may be useful to look for specific causes of obstruction and possible treatment options.   d. If patient is intolerant to PAP therapy, consider referral to ENT for evaluation for hypoglossal nerve stimulator.  3. Close follow-up is necessary to ensure success with CPAP or oral appliance therapy for maximum benefit .  4. A follow-up oximetry study on CPAP is recommended to assess the adequacy of therapy and determine the need for supplemental oxygen or the potential need for Bi-level therapy. An arterial blood gas to determine the adequacy of baseline ventilation and oxygenation should also be considered.  5. Healthy sleep recommendations include: adequate nightly sleep (normal 7-9 hrs/night), avoidance of caffeine after noon and alcohol near bedtime, and maintaining a sleep environment that is cool, dark and quiet.  6. Weight loss for overweight patients is recommended. Even modest amounts of weight loss can significantly improve the severity of sleep apnea.  7. Snoring recommendations include: weight loss where appropriate, side sleeping, and avoidance of alcohol before bed.  8. Operation of motor vehicle should be avoided when sleepy.  Signature: Electronically Signed: 07/26/21 Fransico Him, MD; [facility]; [place], American Board of Sleep Medicine

## 2021-08-06 ENCOUNTER — Ambulatory Visit: Payer: Medicare HMO

## 2021-08-06 ENCOUNTER — Encounter: Payer: Self-pay | Admitting: Cardiology

## 2021-08-06 ENCOUNTER — Ambulatory Visit: Payer: Medicare HMO | Admitting: Cardiology

## 2021-08-06 DIAGNOSIS — G4733 Obstructive sleep apnea (adult) (pediatric): Secondary | ICD-10-CM

## 2021-08-06 NOTE — Progress Notes (Deleted)
Cardiology Office Note  Date: 08/06/2021   ID: [name] [name], [name] [name] 10-05-1945, MRN [id number]  PCP:  Nickola Major, MD  Cardiologist:  Rozann Lesches, MD Electrophysiologist:  None   No chief complaint on file.   History of Present Illness: [name] [name] is a 76 y.o. male last seen in December 2022 in the heart failure clinic now presenting for a routine visit.  Demadex was increased to 40 mg twice daily at last visit due to weight gain since prior hospital discharge.  Right heart catheterization and echocardiogram results from November 2022 are noted below.  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  He awaits formal PFTs.  Interval sleep study was indicative of mild OSA.  Past Medical History:  Diagnosis Date   Atrial fibrillation ([place])    CAD (coronary artery disease)    DES to LAD June 2018   CKD (chronic kidney disease) stage 3, GFR 30-59 ml/min (HCC)    Essential hypertension    History of pneumonia    Hyperlipidemia    Hypothyroidism    LBBB (left bundle branch block)    Morbid obesity ([place])    Non Hodgkin's lymphoma ([place])    Status post XRT and chemotherapy   NSTEMI (non-ST elevated myocardial infarction) [facility])    June 2018   Peripheral neuropathy    Pneumonia due to COVID-19 virus    Sleep apnea    Type 2 diabetes mellitus ([place])     Past Surgical History:  Procedure Laterality Date   CARDIOVERSION N/A 06/20/2021   Procedure: CARDIOVERSION;  Surgeon: Larey Dresser, MD;  Location: [place];  Service: Cardiovascular;  Laterality: N/A;   CHOLECYSTECTOMY  1992   COLONOSCOPY N/A 09/03/2014   SLF:six colon polyps removed/small internal hemorrhoids   CORONARY STENT INTERVENTION N/A 01/21/2017   Procedure: Coronary Stent Intervention;  Surgeon: Nelva Bush, MD;  Location: [place] [facility];  Service: Cardiovascular;  Laterality: N/A;   ESOPHAGOGASTRODUODENOSCOPY N/A 09/03/2014   SLF: mild gastritis/few  gastric polyps   LEFT HEART CATH AND CORONARY ANGIOGRAPHY N/A 01/20/2017   Procedure: Left Heart Cath and Coronary Angiography;  Surgeon: Jettie Booze, MD;  Location: [place] [facility];  Service: Cardiovascular;  Laterality: N/A;   RIGHT HEART CATH N/A 06/19/2021   Procedure: RIGHT HEART CATH;  Surgeon: Larey Dresser, MD;  Location: [place] [facility];  Service: Cardiovascular;  Laterality: N/A;   TEE WITHOUT CARDIOVERSION N/A 06/20/2021   Procedure: TRANSESOPHAGEAL ECHOCARDIOGRAM (TEE);  Surgeon: Larey Dresser, MD;  Location: [facility] ENDOSCOPY;  Service: Cardiovascular;  Laterality: N/A;   TOTAL KNEE ARTHROPLASTY  11/10/2011   Procedure: TOTAL KNEE ARTHROPLASTY;  Surgeon: Mauri Pole, MD;  [facility];  Service: Orthopedics;  Laterality: Right;   TOTAL KNEE ARTHROPLASTY Left 05/24/2018   Procedure: LEFT TOTAL KNEE ARTHROPLASTY;  Surgeon: Melrose Nakayama, MD;  Location: [place];  Service: Orthopedics;  Laterality: Left;    Current Outpatient Medications  Medication Sig Dispense Refill   acetaminophen (TYLENOL) 325 MG tablet Take 2 tablets (650 mg total) by mouth every 6 (six) hours as needed for mild pain (or Fever >/= 101). 30 tablet 1   amiodarone (PACERONE) 200 MG tablet Take 200 mg by mouth daily.     apixaban (ELIQUIS) 5 MG TABS tablet Take 1 tablet (5 mg total) by mouth 2 (two) times daily. This is a dose change 60 tablet 6   fluticasone (FLONASE) 50 MCG/ACT  nasal spray Place 2 sprays into both nostrils daily.     insulin degludec (TRESIBA FLEXTOUCH) 200 UNIT/ML FlexTouch Pen Inject 25 Units into the skin daily.     ipratropium-albuterol (DUONEB) 0.5-2.5 (3) MG/3ML SOLN Inhale 3 mLs into the lungs every 4 (four) hours as needed (shortness of breath).     isosorbide mononitrate (IMDUR) 30 MG 24 hr tablet Take 1 tablet (30 mg total) by mouth daily. 30 tablet 6   levocetirizine (XYZAL) 5 MG tablet Take 5 mg by mouth daily.     levothyroxine (SYNTHROID) 100 MCG tablet Take 100  mcg by mouth daily before breakfast. (Takes with 200 mcg tab for a total of 300 mcg once daily)     levothyroxine (SYNTHROID, LEVOTHROID) 200 MCG tablet Take 300 mcg by mouth daily before breakfast. (takes with 115mcg tab for a total of 379mcg)     nitroGLYCERIN (NITROSTAT) 0.4 MG SL tablet Place 0.4 mg under the tongue every 5 (five) minutes as needed for chest pain.   11   OXYGEN Inhale 3-4 L into the lungs continuous.     potassium chloride SA (KLOR-CON) 10 MEQ tablet Take 1 tablet (10 mEq total) by mouth daily. 30 tablet 0   senna-docusate (SENOKOT-S) 8.6-50 MG tablet Take 1 tablet by mouth daily as needed for mild constipation.     simvastatin (ZOCOR) 5 MG tablet Take 5 mg by mouth daily.     sodium chloride (OCEAN) 0.65 % SOLN nasal spray Place 1 spray into both nostrils as needed for congestion.     SYMBICORT 160-4.5 MCG/ACT inhaler Inhale 2 puffs into the lungs daily as needed (shortness of breath).     torsemide (DEMADEX) 20 MG tablet Take 2 tablets (40 mg total) by mouth 2 (two) times daily. 120 tablet 6   No current facility-administered medications for this visit.   Allergies:  Patient has no known allergies.   Social History: The patient  reports that he quit smoking about 22 months ago. His smoking use included cigarettes. He has a 30.00 pack-year smoking history. He has never used smokeless tobacco. He reports that he does not drink alcohol and does not use drugs.   Family History: The patient's family history includes Cancer in his father, maternal uncle, mother, and paternal uncle.   ROS:  Please see the history of present illness. Otherwise, complete review of systems is positive for {NONE DEFAULTED:18576}.  All other systems are reviewed and negative.   Physical Exam: VS:  There were no vitals taken for this visit., BMI There is no height or weight on file to calculate BMI.  Wt Readings from Last 3 Encounters:  07/15/21 290 lb 12.8 oz (131.9 kg)  06/26/21 274 lb 11.1 oz  (124.6 kg)  06/04/21 (!) 305 lb 4.8 oz (138.5 kg)    General: Patient appears comfortable at rest. HEENT: Conjunctiva and lids normal, oropharynx clear with moist mucosa. Neck: Supple, no elevated JVP or carotid bruits, no thyromegaly. Lungs: Clear to auscultation, nonlabored breathing at rest. Cardiac: Regular rate and rhythm, no S3 or significant systolic murmur, no pericardial rub. Abdomen: Soft, nontender, no hepatomegaly, bowel sounds present, no guarding or rebound. Extremities: No pitting edema, distal pulses 2+. Skin: Warm and dry. Musculoskeletal: No kyphosis. Neuropsychiatric: Alert and oriented x3, affect grossly appropriate.  ECG:  An ECG dated 07/15/2021 was personally reviewed today and demonstrated:  Sinus rhythm with prolonged PR interval and IVCD.  Recent Labwork: 10/06/2020: TSH 1.681 06/11/2021: ALT 10; AST 13 06/23/2021: Hemoglobin  13.0; Platelets 108 06/24/2021: Magnesium 2.5 07/15/2021: B Natriuretic Peptide 100.1; BUN 31; Creatinine, Ser 2.70; Potassium 3.6; Sodium 141     Component Value Date/Time   CHOL 95 06/25/2020 0607   TRIG 126 06/25/2020 0607   HDL 21 (L) 06/25/2020 0607   CHOLHDL 4.5 06/25/2020 0607   VLDL 25 06/25/2020 0607   LDLCALC 49 06/25/2020 0607    Other Studies Reviewed Today:  Right heart catheterization 06/19/2021: RA mean 10 RV 67/10 PA 69/22 PCWP mean 18  Oxygen saturations: PA 63% AO 99%  Cardiac Output (Fick) 4.91  Cardiac Index (Fick) 1.94 PVR 4.1 WU PAPI 5.7  1. Right and left heart filling pressures are mildly elevated.  2. Moderate-severe PAH with PVR 4 WU.  3. Low cardiac index, 1.94.   Echocardiogram 06/19/2021:  1. Left ventricular ejection fraction, by estimation, is 45 to 50%. The  left ventricle has mildly decreased function. The left ventricle  demonstrates global hypokinesis. The left ventricular internal cavity size  was severely dilated. Left ventricular  diastolic function could not be evaluated.    2. Right ventricular systolic function is moderately reduced. The right  ventricular size is not well visualized. There is moderately elevated  pulmonary artery systolic pressure.   3. Left atrial size was moderately dilated.   4. The mitral valve is grossly normal. Trivial mitral valve  regurgitation. No evidence of mitral stenosis.   5. The aortic valve is grossly normal. There is mild calcification of the  aortic valve. Aortic valve regurgitation is trivial. Aortic valve  sclerosis is present, with no evidence of aortic valve stenosis.   6. Aortic dilatation noted. There is borderline dilatation of the  ascending aorta, measuring 39 mm.   7. The inferior vena cava is dilated in size with <50% respiratory  variability, suggesting right atrial pressure of 15 mmHg.  Assessment and Plan:    Medication Adjustments/Labs and Tests Ordered: Current medicines are reviewed at length with the patient today.  Concerns regarding medicines are outlined above.   Tests Ordered: No orders of the defined types were placed in this encounter.   Medication Changes: No orders of the defined types were placed in this encounter.   Disposition:  Follow up {follow up:15908}  Signed, Satira Sark, MD, [facility] 08/06/2021 9:04 AM    [facility] [place], [place], [place] [address] Phone: [phone]; Fax: [phone]

## 2021-08-07 ENCOUNTER — Other Ambulatory Visit: Payer: Self-pay

## 2021-08-07 DIAGNOSIS — G4733 Obstructive sleep apnea (adult) (pediatric): Secondary | ICD-10-CM

## 2021-08-14 ENCOUNTER — Inpatient Hospital Stay (HOSPITAL_COMMUNITY): Payer: Medicare HMO

## 2021-08-14 ENCOUNTER — Emergency Department (HOSPITAL_COMMUNITY): Payer: Medicare HMO

## 2021-08-14 ENCOUNTER — Other Ambulatory Visit: Payer: Self-pay

## 2021-08-14 ENCOUNTER — Inpatient Hospital Stay (HOSPITAL_COMMUNITY)
Admission: EM | Admit: 2021-08-14 | Discharge: 2021-08-21 | DRG: 286 | Disposition: A | Discharge status: Home Health | Payer: Medicare HMO | Attending: Internal Medicine | Admitting: Internal Medicine

## 2021-08-14 ENCOUNTER — Encounter (HOSPITAL_COMMUNITY): Payer: Self-pay

## 2021-08-14 DIAGNOSIS — Z9221 Personal history of antineoplastic chemotherapy: Secondary | ICD-10-CM

## 2021-08-14 DIAGNOSIS — E876 Hypokalemia: Secondary | ICD-10-CM | POA: Diagnosis present

## 2021-08-14 DIAGNOSIS — I2510 Atherosclerotic heart disease of native coronary artery without angina pectoris: Secondary | ICD-10-CM | POA: Diagnosis present

## 2021-08-14 DIAGNOSIS — I252 Old myocardial infarction: Secondary | ICD-10-CM

## 2021-08-14 DIAGNOSIS — Z20822 Contact with and (suspected) exposure to covid-19: Secondary | ICD-10-CM | POA: Diagnosis present

## 2021-08-14 DIAGNOSIS — Z7901 Long term (current) use of anticoagulants: Secondary | ICD-10-CM | POA: Diagnosis not present

## 2021-08-14 DIAGNOSIS — J449 Chronic obstructive pulmonary disease, unspecified: Secondary | ICD-10-CM | POA: Diagnosis present

## 2021-08-14 DIAGNOSIS — J9621 Acute and chronic respiratory failure with hypoxia: Secondary | ICD-10-CM | POA: Diagnosis present

## 2021-08-14 DIAGNOSIS — Z8616 Personal history of COVID-19: Secondary | ICD-10-CM | POA: Diagnosis not present

## 2021-08-14 DIAGNOSIS — N179 Acute kidney failure, unspecified: Secondary | ICD-10-CM | POA: Diagnosis present

## 2021-08-14 DIAGNOSIS — Z955 Presence of coronary angioplasty implant and graft: Secondary | ICD-10-CM

## 2021-08-14 DIAGNOSIS — I130 Hypertensive heart and chronic kidney disease with heart failure and stage 1 through stage 4 chronic kidney disease, or unspecified chronic kidney disease: Secondary | ICD-10-CM | POA: Diagnosis present

## 2021-08-14 DIAGNOSIS — D696 Thrombocytopenia, unspecified: Secondary | ICD-10-CM | POA: Diagnosis not present

## 2021-08-14 DIAGNOSIS — K08199 Complete loss of teeth due to other specified cause, unspecified class: Secondary | ICD-10-CM

## 2021-08-14 DIAGNOSIS — Z87891 Personal history of nicotine dependence: Secondary | ICD-10-CM

## 2021-08-14 DIAGNOSIS — D631 Anemia in chronic kidney disease: Secondary | ICD-10-CM | POA: Diagnosis present

## 2021-08-14 DIAGNOSIS — I10 Essential (primary) hypertension: Secondary | ICD-10-CM | POA: Diagnosis present

## 2021-08-14 DIAGNOSIS — J9611 Chronic respiratory failure with hypoxia: Secondary | ICD-10-CM | POA: Diagnosis not present

## 2021-08-14 DIAGNOSIS — I5033 Acute on chronic diastolic (congestive) heart failure: Secondary | ICD-10-CM | POA: Insufficient documentation

## 2021-08-14 DIAGNOSIS — Z794 Long term (current) use of insulin: Secondary | ICD-10-CM | POA: Diagnosis not present

## 2021-08-14 DIAGNOSIS — E1122 Type 2 diabetes mellitus with diabetic chronic kidney disease: Secondary | ICD-10-CM | POA: Diagnosis present

## 2021-08-14 DIAGNOSIS — I509 Heart failure, unspecified: Secondary | ICD-10-CM | POA: Diagnosis not present

## 2021-08-14 DIAGNOSIS — Z9981 Dependence on supplemental oxygen: Secondary | ICD-10-CM

## 2021-08-14 DIAGNOSIS — I5043 Acute on chronic combined systolic (congestive) and diastolic (congestive) heart failure: Secondary | ICD-10-CM | POA: Diagnosis present

## 2021-08-14 DIAGNOSIS — Z96653 Presence of artificial knee joint, bilateral: Secondary | ICD-10-CM | POA: Diagnosis present

## 2021-08-14 DIAGNOSIS — E039 Hypothyroidism, unspecified: Secondary | ICD-10-CM | POA: Diagnosis present

## 2021-08-14 DIAGNOSIS — Z79890 Hormone replacement therapy (postmenopausal): Secondary | ICD-10-CM

## 2021-08-14 DIAGNOSIS — Z9049 Acquired absence of other specified parts of digestive tract: Secondary | ICD-10-CM

## 2021-08-14 DIAGNOSIS — I4819 Other persistent atrial fibrillation: Secondary | ICD-10-CM | POA: Diagnosis not present

## 2021-08-14 DIAGNOSIS — I480 Paroxysmal atrial fibrillation: Secondary | ICD-10-CM | POA: Diagnosis present

## 2021-08-14 DIAGNOSIS — Z6841 Body Mass Index (BMI) 40.0 and over, adult: Secondary | ICD-10-CM | POA: Diagnosis not present

## 2021-08-14 DIAGNOSIS — Z79899 Other long term (current) drug therapy: Secondary | ICD-10-CM

## 2021-08-14 DIAGNOSIS — R600 Localized edema: Secondary | ICD-10-CM | POA: Diagnosis not present

## 2021-08-14 DIAGNOSIS — E662 Morbid (severe) obesity with alveolar hypoventilation: Secondary | ICD-10-CM | POA: Diagnosis present

## 2021-08-14 DIAGNOSIS — R001 Bradycardia, unspecified: Secondary | ICD-10-CM | POA: Diagnosis not present

## 2021-08-14 DIAGNOSIS — I272 Pulmonary hypertension, unspecified: Secondary | ICD-10-CM | POA: Diagnosis not present

## 2021-08-14 DIAGNOSIS — I5031 Acute diastolic (congestive) heart failure: Secondary | ICD-10-CM

## 2021-08-14 DIAGNOSIS — E11649 Type 2 diabetes mellitus with hypoglycemia without coma: Secondary | ICD-10-CM | POA: Diagnosis present

## 2021-08-14 DIAGNOSIS — I50810 Right heart failure, unspecified: Secondary | ICD-10-CM | POA: Diagnosis not present

## 2021-08-14 DIAGNOSIS — Z0120 Encounter for dental examination and cleaning without abnormal findings: Secondary | ICD-10-CM

## 2021-08-14 DIAGNOSIS — J9622 Acute and chronic respiratory failure with hypercapnia: Secondary | ICD-10-CM | POA: Diagnosis present

## 2021-08-14 DIAGNOSIS — E785 Hyperlipidemia, unspecified: Secondary | ICD-10-CM | POA: Diagnosis present

## 2021-08-14 DIAGNOSIS — N184 Chronic kidney disease, stage 4 (severe): Secondary | ICD-10-CM | POA: Diagnosis present

## 2021-08-14 DIAGNOSIS — Z923 Personal history of irradiation: Secondary | ICD-10-CM

## 2021-08-14 DIAGNOSIS — Z8572 Personal history of non-Hodgkin lymphomas: Secondary | ICD-10-CM

## 2021-08-14 DIAGNOSIS — I440 Atrioventricular block, first degree: Secondary | ICD-10-CM | POA: Diagnosis not present

## 2021-08-14 LAB — TSH: TSH: 1.652 u[IU]/mL (ref 0.350–4.500)

## 2021-08-14 LAB — ECHOCARDIOGRAM COMPLETE
AR max vel: 2.13 cm2
AV Area VTI: 1.95 cm2
AV Area mean vel: 2.03 cm2
AV Mean grad: 9 mmHg
AV Peak grad: 14.7 mmHg
Ao pk vel: 1.92 m/s
Area-P 1/2: 4.39 cm2
Height: 73 in
S' Lateral: 4.8 cm
Weight: 4800 oz

## 2021-08-14 LAB — CBC WITH DIFFERENTIAL/PLATELET
Abs Immature Granulocytes: 0.01 10*3/uL (ref 0.00–0.07)
Basophils Absolute: 0.1 10*3/uL (ref 0.0–0.1)
Basophils Relative: 2 %
Eosinophils Absolute: 0.2 10*3/uL (ref 0.0–0.5)
Eosinophils Relative: 4 %
HCT: 39.1 % (ref 39.0–52.0)
Hemoglobin: 12.8 g/dL — ABNORMAL LOW (ref 13.0–17.0)
Immature Granulocytes: 0 %
Lymphocytes Relative: 22 %
Lymphs Abs: 1.2 10*3/uL (ref 0.7–4.0)
MCH: 29 pg (ref 26.0–34.0)
MCHC: 32.7 g/dL (ref 30.0–36.0)
MCV: 88.5 fL (ref 80.0–100.0)
Monocytes Absolute: 0.4 10*3/uL (ref 0.1–1.0)
Monocytes Relative: 8 %
Neutro Abs: 3.4 10*3/uL (ref 1.7–7.7)
Neutrophils Relative %: 64 %
Platelets: 148 10*3/uL — ABNORMAL LOW (ref 150–400)
RBC: 4.42 MIL/uL (ref 4.22–5.81)
RDW: 16.1 % — ABNORMAL HIGH (ref 11.5–15.5)
WBC: 5.3 10*3/uL (ref 4.0–10.5)
nRBC: 0 % (ref 0.0–0.2)

## 2021-08-14 LAB — BRAIN NATRIURETIC PEPTIDE: B Natriuretic Peptide: 342 pg/mL — ABNORMAL HIGH (ref 0.0–100.0)

## 2021-08-14 LAB — TROPONIN I (HIGH SENSITIVITY)
Troponin I (High Sensitivity): 25 ng/L — ABNORMAL HIGH (ref <18)
Troponin I (High Sensitivity): 24 ng/L — ABNORMAL HIGH (ref <18)

## 2021-08-14 LAB — BASIC METABOLIC PANEL
Anion gap: 11 (ref 5–15)
BUN: 68 mg/dL — ABNORMAL HIGH (ref 8–23)
CO2: 25 mmol/L (ref 22–32)
Calcium: 8.9 mg/dL (ref 8.9–10.3)
Chloride: 105 mmol/L (ref 98–111)
Creatinine, Ser: 4.06 mg/dL — ABNORMAL HIGH (ref 0.61–1.24)
GFR, Estimated: 15 mL/min — ABNORMAL LOW (ref >60)
Glucose, Bld: 115 mg/dL — ABNORMAL HIGH (ref 70–99)
Potassium: 3.1 mmol/L — ABNORMAL LOW (ref 3.5–5.1)
Sodium: 141 mmol/L (ref 135–145)

## 2021-08-14 LAB — MAGNESIUM: Magnesium: 2.3 mg/dL (ref 1.7–2.4)

## 2021-08-14 LAB — RESP PANEL BY RT-PCR (FLU A&B, COVID) ARPGX2
Influenza A by PCR: NEGATIVE
Influenza B by PCR: NEGATIVE
SARS Coronavirus 2 by RT PCR: NEGATIVE

## 2021-08-14 MED ORDER — SENNOSIDES-DOCUSATE SODIUM 8.6-50 MG PO TABS: 1.0000 | ORAL_TABLET | Freq: Every day | ORAL | Status: DC | PRN

## 2021-08-14 MED ORDER — PERFLUTREN LIPID MICROSPHERE
1.0000 mL | INTRAVENOUS | Status: AC | PRN
  Administered 2021-08-14: 2 mL via INTRAVENOUS
  Filled 2021-08-14: qty 10

## 2021-08-14 MED ORDER — SODIUM CHLORIDE 0.9% FLUSH: 3.0000 mL | INTRAVENOUS | Status: DC | PRN

## 2021-08-14 MED ORDER — ACETAMINOPHEN 650 MG RE SUPP: 650.0000 mg | Freq: Four times a day (QID) | RECTAL | Status: DC | PRN

## 2021-08-14 MED ORDER — SODIUM CHLORIDE 0.9% FLUSH
3.0000 mL | Freq: Two times a day (BID) | INTRAVENOUS | Status: DC
  Administered 2021-08-14 – 2021-08-20 (×9): 3 mL via INTRAVENOUS

## 2021-08-14 MED ORDER — TRAZODONE HCL 50 MG PO TABS
50.0000 mg | ORAL_TABLET | Freq: Every evening | ORAL | Status: DC | PRN
  Administered 2021-08-14 – 2021-08-17 (×3): 50 mg via ORAL
  Filled 2021-08-14 (×3): qty 1

## 2021-08-14 MED ORDER — APIXABAN 5 MG PO TABS
5.0000 mg | ORAL_TABLET | Freq: Two times a day (BID) | ORAL | Status: DC
  Administered 2021-08-14 – 2021-08-21 (×14): 5 mg via ORAL
  Filled 2021-08-14 (×14): qty 1

## 2021-08-14 MED ORDER — FUROSEMIDE 10 MG/ML IJ SOLN
40.0000 mg | Freq: Two times a day (BID) | INTRAMUSCULAR | Status: DC
  Administered 2021-08-14 – 2021-08-15 (×2): 40 mg via INTRAVENOUS
  Filled 2021-08-14 (×3): qty 4

## 2021-08-14 MED ORDER — MOMETASONE FURO-FORMOTEROL FUM 200-5 MCG/ACT IN AERO
2.0000 | INHALATION_SPRAY | Freq: Two times a day (BID) | RESPIRATORY_TRACT | Status: DC
  Administered 2021-08-15 – 2021-08-21 (×12): 2 via RESPIRATORY_TRACT
  Filled 2021-08-14: qty 8.8

## 2021-08-14 MED ORDER — SODIUM CHLORIDE 0.9 % IV SOLN: 250.0000 mL | INTRAVENOUS | Status: DC | PRN

## 2021-08-14 MED ORDER — LEVOTHYROXINE SODIUM 50 MCG PO TABS: 300.0000 ug | ORAL_TABLET | Freq: Every day | ORAL | Status: DC

## 2021-08-14 MED ORDER — LORATADINE 10 MG PO TABS
10.0000 mg | ORAL_TABLET | Freq: Every day | ORAL | Status: DC
  Administered 2021-08-15 – 2021-08-21 (×7): 10 mg via ORAL
  Filled 2021-08-14 (×7): qty 1

## 2021-08-14 MED ORDER — IPRATROPIUM-ALBUTEROL 0.5-2.5 (3) MG/3ML IN SOLN: 3.0000 mL | RESPIRATORY_TRACT | Status: DC | PRN

## 2021-08-14 MED ORDER — AMIODARONE HCL 200 MG PO TABS: 200.0000 mg | ORAL_TABLET | Freq: Every day | ORAL | Status: DC

## 2021-08-14 MED ORDER — POTASSIUM CHLORIDE CRYS ER 20 MEQ PO TBCR
40.0000 meq | EXTENDED_RELEASE_TABLET | Freq: Once | ORAL | Status: AC
  Administered 2021-08-14: 40 meq via ORAL
  Filled 2021-08-14: qty 2

## 2021-08-14 MED ORDER — SALINE SPRAY 0.65 % NA SOLN
1.0000 | NASAL | Status: DC | PRN
  Administered 2021-08-17: 1 via NASAL
  Filled 2021-08-14 (×2): qty 44

## 2021-08-14 MED ORDER — INSULIN GLARGINE-YFGN 100 UNIT/ML ~~LOC~~ SOLN
25.0000 [IU] | Freq: Every day | SUBCUTANEOUS | Status: DC
  Administered 2021-08-14: 25 [IU] via SUBCUTANEOUS
  Filled 2021-08-14 (×3): qty 0.25

## 2021-08-14 MED ORDER — FUROSEMIDE 10 MG/ML IJ SOLN
40.0000 mg | Freq: Once | INTRAMUSCULAR | Status: AC
  Administered 2021-08-14: 40 mg via INTRAVENOUS
  Filled 2021-08-14: qty 4

## 2021-08-14 MED ORDER — ONDANSETRON HCL 4 MG PO TABS: 4.0000 mg | ORAL_TABLET | Freq: Four times a day (QID) | ORAL | Status: DC | PRN

## 2021-08-14 MED ORDER — ACETAMINOPHEN 325 MG PO TABS: 650.0000 mg | ORAL_TABLET | Freq: Four times a day (QID) | ORAL | Status: DC | PRN

## 2021-08-14 MED ORDER — ONDANSETRON HCL 4 MG/2ML IJ SOLN: 4.0000 mg | Freq: Four times a day (QID) | INTRAMUSCULAR | Status: DC | PRN

## 2021-08-14 MED ORDER — LEVOTHYROXINE SODIUM 150 MCG PO TABS
300.0000 ug | ORAL_TABLET | Freq: Every day | ORAL | Status: DC
  Administered 2021-08-15 – 2021-08-21 (×7): 300 ug via ORAL
  Filled 2021-08-14 (×9): qty 2

## 2021-08-14 MED ORDER — NITROGLYCERIN 0.4 MG SL SUBL: 0.4000 mg | SUBLINGUAL_TABLET | SUBLINGUAL | Status: DC | PRN

## 2021-08-14 MED ORDER — SIMVASTATIN 5 MG PO TABS
5.0000 mg | ORAL_TABLET | Freq: Every day | ORAL | Status: DC
  Administered 2021-08-15 – 2021-08-17 (×3): 5 mg via ORAL
  Filled 2021-08-14 (×5): qty 1

## 2021-08-14 NOTE — Consult Note (Signed)
Cardiology Consultation:   Patient ID: [name] [name]; [id number]; February 25, 1946   Admit date: 08/14/2021 Date of Consult: 08/14/2021  Primary Care Provider: Nickola Major, MD Primary Cardiologist: Rozann Lesches, MD Primary Electrophysiologist: None   Patient Profile:   [name] [name] is a 76 y.o. male with a history of HFpEF and chronic diastolic heart failure, persistent atrial fibrillation, CKD stage IV, pulmonary hypertension group 2 and 3, CAD with occluded OM1 and DES to the LAD, and chronic hypoxemic respiratory failure who is being seen today for the evaluation of progressive shortness of breath and weight gain at the request of Dr. Manuella Ghazi.  History of Present Illness:   Mr. Gettis presents to the [facility] ER reporting progressive shortness of breath and weight gain over the last few weeks.  He states that he has been compliant with regular use of Demadex, no unusual fluid or sodium intake.  Reports NYHA class III symptoms at this time.  His current weight is 300 pounds up from 290 pounds at last visit in the heart failure clinic in December 2022, and up from his prior discharge weight of 274 pounds in November 2022.  He has had associated bilateral leg swelling and increased abdominal protuberance.  BNP 342 at this time, chest x-ray showing stable cardiomegaly with mild pulmonary vascular congestion and pulmonary edema.  High-sensitivity troponin I levels argue against ACS.  Current ECG shows probable sinus rhythm with prolonged PR interval and IVCD, heart rate in the 40s.  Past Medical History:  Diagnosis Date   Atrial fibrillation ([place])    CAD (coronary artery disease)    DES to LAD June 2018   CKD (chronic kidney disease) stage 3, GFR 30-59 ml/min (HCC)    Essential hypertension    History of pneumonia    Hyperlipidemia    Hypothyroidism    LBBB (left bundle branch block)    Morbid obesity ([place])    Non Hodgkin's lymphoma ([place])    Status post XRT and chemotherapy    NSTEMI (non-ST elevated myocardial infarction) [facility])    June 2018   Peripheral neuropathy    Pneumonia due to COVID-19 virus    Sleep apnea    Type 2 diabetes mellitus ([place])     Past Surgical History:  Procedure Laterality Date   CARDIOVERSION N/A 06/20/2021   Procedure: CARDIOVERSION;  Surgeon: Larey Dresser, MD;  Location: [place];  Service: Cardiovascular;  Laterality: N/A;   CHOLECYSTECTOMY  1992   COLONOSCOPY N/A 09/03/2014   SLF:six colon polyps removed/small internal hemorrhoids   CORONARY STENT INTERVENTION N/A 01/21/2017   Procedure: Coronary Stent Intervention;  Surgeon: Nelva Bush, MD;  Location: [place] [facility];  Service: Cardiovascular;  Laterality: N/A;   ESOPHAGOGASTRODUODENOSCOPY N/A 09/03/2014   SLF: mild gastritis/few gastric polyps   LEFT HEART CATH AND CORONARY ANGIOGRAPHY N/A 01/20/2017   Procedure: Left Heart Cath and Coronary Angiography;  Surgeon: Jettie Booze, MD;  Location: [place] [facility];  Service: Cardiovascular;  Laterality: N/A;   RIGHT HEART CATH N/A 06/19/2021   Procedure: RIGHT HEART CATH;  Surgeon: Larey Dresser, MD;  Location: [place] [facility];  Service: Cardiovascular;  Laterality: N/A;   TEE WITHOUT CARDIOVERSION N/A 06/20/2021   Procedure: TRANSESOPHAGEAL ECHOCARDIOGRAM (TEE);  Surgeon: Larey Dresser, MD;  Location: [facility] ENDOSCOPY;  Service: Cardiovascular;  Laterality: N/A;   TOTAL KNEE ARTHROPLASTY  11/10/2011   Procedure: TOTAL KNEE ARTHROPLASTY;  Surgeon: Mauri Pole, MD;  [facility];  Service: Orthopedics;  Laterality: Right;   TOTAL KNEE ARTHROPLASTY Left 05/24/2018   Procedure: LEFT TOTAL KNEE ARTHROPLASTY;  Surgeon: Melrose Nakayama, MD;  Location: [place];  Service: Orthopedics;  Laterality: Left;     Inpatient Medications: Scheduled Meds:  [START ON 08/15/2021] amiodarone  200 mg Oral Daily   apixaban  5 mg Oral BID   furosemide  40 mg Intravenous Q12H   insulin degludec   Subcutaneous Daily   [START  ON 08/15/2021] levothyroxine  100 mcg Oral QAC breakfast   [START ON 08/15/2021] levothyroxine  300 mcg Oral QAC breakfast   [START ON 08/15/2021] loratadine  10 mg Oral Daily   mometasone-formoterol  2 puff Inhalation BID   [START ON 08/15/2021] simvastatin  5 mg Oral Daily   sodium chloride flush  3 mL Intravenous Q12H   Continuous Infusions:  sodium chloride     PRN Meds: sodium chloride, acetaminophen **OR** acetaminophen, ipratropium-albuterol, nitroGLYCERIN, ondansetron **OR** ondansetron (ZOFRAN) IV, senna-docusate, sodium chloride, sodium chloride flush  Allergies:   No Known Allergies  Social History:   Social History   Tobacco Use   Smoking status: Former    Packs/day: 1.00    Years: 30.00    Pack years: 30.00    Types: Cigarettes    Quit date: 10/2019    Years since quitting: 1.8   Smokeless tobacco: Never  Substance Use Topics   Alcohol use: No    Alcohol/week: 0.0 standard drinks     Family History:   The patient's family history includes Cancer in his father, maternal uncle, mother, and paternal uncle. There is no history of Colon cancer.  ROS:  Please see the history of present illness.  No cough, no fevers or chills.  All other ROS reviewed and negative.     Physical Exam/Data:   Vitals:   08/14/21 1242 08/14/21 1243 08/14/21 1244 08/14/21 1251  BP:    (!) 114/58  Pulse:   90 (!) 44  Resp: 14 19 13 16   Temp:      TempSrc:      SpO2:   97% 97%  Weight:      Height:       No intake or output data in the 24 hours ending 08/14/21 1455 Filed Weights   08/14/21 1029  Weight: 136.1 kg   Body mass index is 39.58 kg/m.   Gen: Chronically ill-appearing and obese male. HEENT: Conjunctiva and lids normal, oropharynx clear. Neck: Supple, elevated JVP, no carotid bruits, no thyromegaly. Lungs: Clear to auscultation, nonlabored breathing at rest. Cardiac: Distant regular heart sounds, no S3, 2/6 apical systolic murmur, no pericardial rub. Abdomen:  Protuberant, bowel sounds present. Extremities: 2-3+ leg edema with venous stasis distally. Skin: Warm and dry. Musculoskeletal: No kyphosis. Neuropsychiatric: Alert and oriented x3, affect grossly appropriate.  EKG:  An ECG dated 08/14/2021 was personally reviewed today and demonstrated:  Probable sinus bradycardia with prolonged PR interval and IVCD.  Telemetry:  I personally reviewed telemetry which shows sinus rhythm.  Relevant CV Studies:  Right heart catheterization 06/19/2021: 1. Right and left heart filling pressures are mildly elevated.  2. Moderate-severe PAH with PVR 4 WU.  3. Low cardiac index, 1.94.   Echocardiogram 06/19/2021:  1. Left ventricular ejection fraction, by estimation, is 45 to 50%. The  left ventricle has mildly decreased function. The left ventricle  demonstrates global hypokinesis. The left ventricular internal cavity size  was severely dilated. Left ventricular  diastolic function could not be evaluated.   2.  Right ventricular systolic function is moderately reduced. The right  ventricular size is not well visualized. There is moderately elevated  pulmonary artery systolic pressure.   3. Left atrial size was moderately dilated.   4. The mitral valve is grossly normal. Trivial mitral valve  regurgitation. No evidence of mitral stenosis.   5. The aortic valve is grossly normal. There is mild calcification of the  aortic valve. Aortic valve regurgitation is trivial. Aortic valve  sclerosis is present, with no evidence of aortic valve stenosis.   6. Aortic dilatation noted. There is borderline dilatation of the  ascending aorta, measuring 39 mm.   7. The inferior vena cava is dilated in size with <50% respiratory  variability, suggesting right atrial pressure of 15 mmHg.   Laboratory Data:  Chemistry Recent Labs  Lab 08/14/21 1040  NA 141  K 3.1*  CL 105  CO2 25  GLUCOSE 115*  BUN 68*  CREATININE 4.06*  CALCIUM 8.9  GFRNONAA 15*  ANIONGAP  11     Hematology Recent Labs  Lab 08/14/21 1040  WBC 5.3  RBC 4.42  HGB 12.8*  HCT 39.1  MCV 88.5  MCH 29.0  MCHC 32.7  RDW 16.1*  PLT 148*   Cardiac Enzymes Recent Labs  Lab 08/14/21 1040 08/14/21 1303  TROPONINIHS 25* 24*   BNP Recent Labs  Lab 08/14/21 1040  BNP 342.0*     Radiology/Studies:  DG Chest Port 1 View  Result Date: 08/14/2021 CLINICAL DATA:  Chest pain, shortness of breath. EXAM: PORTABLE CHEST 1 VIEW COMPARISON:  June 20, 2021. FINDINGS: Stable cardiomediastinal silhouette. Mild central pulmonary vascular congestion is noted with possible minimal bilateral pulmonary edema. Bony thorax is unremarkable. IMPRESSION: Stable cardiomegaly with mild central pulmonary vascular congestion and possible bilateral pulmonary edema. Electronically Signed   By: Marijo Conception M.D.   On: 08/14/2021 11:49    Assessment and Plan:   1.  HFpEF with acute on chronic diastolic heart failure and fluid overload.  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  He is up approximately 25 pounds compared to discharge weight at last hospital stay in November 2022.  As an outpatient he has been on Demadex 40 mg twice daily.  2.  CKD stage IV, current creatinine 4.06.  3.  Persistent atrial fibrillation status post TEE guided cardioversion in November 2022.  CHA2DS2-VASc score is 5.  He remains on amiodarone for rhythm control and also Eliquis for stroke prophylaxis.  Currently in sinus bradycardia with prolonged PR interval and IVCD.  Bisoprolol was to have been discontinued previously due to bradycardia, but I still see this on his outpatient medication list.  TSH and recent LFTs are normal.  4.  CAD status post DES to the LAD in 2018, occluded OM1 at that time in the setting of NSTEMI.  No active angina and cardiac enzymes arguing against ACS.  He is on Zocor.  5.  Chronic hypoxic respiratory failure on supplemental oxygen at home,  suspected OHS/OSA.  PFTs ordered but have not yet been completed.  6.  Moderate to severe pulmonary hypertension, WHO group 2/3, not candidate for pulmonary vasodilators.  Patient being admitted to the hospitalist service, ultimately with plan to transfer to [facility] for continued follow-up with the heart failure team.  Agree with transition from oral Demadex to IV Lasix.  Continue amiodarone and Eliquis.  Continue Zocor.  Home medication list reviewed, would not resume either Norvasc  or bisoprolol if he has been taking these recently.  Signed, Rozann Lesches, MD  08/14/2021 2:55 PM

## 2021-08-14 NOTE — ED Provider Notes (Signed)
[facility] Provider Note   CSN: [id number] Arrival date & time: 08/14/21  1022     History  Chief Complaint  Patient presents with   Shortness of Breath   Leg Swelling    REFORD OLLIFF is a 76 y.o. male.  Pt is a 76 yo wm with a hx of htn, cad, hypothyroidism, obesity, hyperlipidemia, dm, ckd, non-hodgkin's lymphoma, afib on Eliquis, and CHF.  Pt presents to the ED today with sob with exertion and swelling.  He has been compliant with his meds.  He has not had f/c.  No n/v.  He was sent over here by his pcp for further eval.      Home Medications Prior to Admission medications   Medication Sig Start Date End Date Taking? Authorizing Provider  acetaminophen (TYLENOL) 325 MG tablet Take 2 tablets (650 mg total) by mouth every 6 (six) hours as needed for mild pain (or Fever >/= 101). 02/01/20  Yes Emokpae, Courage, MD  amiodarone (PACERONE) 200 MG tablet Take 200 mg by mouth daily.   Yes [provider]  amLODipine (NORVASC) 10 MG tablet Take 10 mg by mouth daily. 08/03/21  Yes [provider]  apixaban (ELIQUIS) 5 MG TABS tablet Take 1 tablet (5 mg total) by mouth 2 (two) times daily. This is a dose change 01/07/21  Yes Verta Ellen., NP  bisoprolol (ZEBETA) 5 MG tablet Take 5 mg by mouth daily. 08/03/21  Yes [provider]  fluticasone (FLONASE) 50 MCG/ACT nasal spray Place 2 sprays into both nostrils daily as needed for allergies.   Yes [provider]  insulin degludec (TRESIBA FLEXTOUCH) 200 UNIT/ML FlexTouch Pen Inject 25 Units into the skin daily.   Yes [provider]  ipratropium-albuterol (DUONEB) 0.5-2.5 (3) MG/3ML SOLN Inhale 3 mLs into the lungs every 4 (four) hours as needed (shortness of breath). 05/22/20  Yes [provider]  isosorbide mononitrate (IMDUR) 30 MG 24 hr tablet Take 1 tablet (30 mg total) by mouth daily. 12/04/20  Yes Satira Sark, MD  levocetirizine (XYZAL) 5 MG tablet Take 5  mg by mouth daily. 08/27/17  Yes [provider]  levothyroxine (SYNTHROID) 100 MCG tablet Take 100 mcg by mouth daily before breakfast. (Takes with 200 mcg tab for a total of 300 mcg once daily)   Yes [provider]  levothyroxine (SYNTHROID, LEVOTHROID) 200 MCG tablet Take 300 mcg by mouth daily before breakfast. (takes with 190mcg tab for a total of 323mcg)   Yes [provider]  nitroGLYCERIN (NITROSTAT) 0.4 MG SL tablet Place 0.4 mg under the tongue every 5 (five) minutes as needed for chest pain.  12/27/17  Yes [provider]  OXYGEN Inhale 3-4 L into the lungs continuous.   Yes [provider]  senna-docusate (SENOKOT-S) 8.6-50 MG tablet Take 1 tablet by mouth daily as needed for mild constipation. 02/01/20  Yes [provider]  simvastatin (ZOCOR) 5 MG tablet Take 5 mg by mouth daily. 09/14/16  Yes [provider]  sodium chloride (OCEAN) 0.65 % SOLN nasal spray Place 1 spray into both nostrils as needed for congestion.   Yes [provider]  SYMBICORT 160-4.5 MCG/ACT inhaler Inhale 2 puffs into the lungs daily as needed (shortness of breath). 12/22/19  Yes [provider]  torsemide (DEMADEX) 20 MG tablet Take 2 tablets (40 mg total) by mouth 2 (two) times daily. 07/15/21  Yes Lyda Jester M, PA-C  potassium chloride SA (  KLOR-CON) 10 MEQ tablet Take 1 tablet (10 mEq total) by mouth daily. Patient not taking: Reported on 08/14/2021 01/24/21   Irwin Brakeman L, MD  insulin aspart (NOVOLOG) 100 UNIT/ML injection Inject 0-15 Units into the skin 3 (three) times daily with meals. Patient not taking: No sig reported 12/31/20 01/22/21  Deatra James, MD      Allergies    Patient has no known allergies.    Review of Systems   Review of Systems  Respiratory:  Positive for shortness of breath.   All other systems reviewed and are negative.  Physical Exam Updated Vital Signs BP (!) 108/54    Pulse (!) 43     Temp 97.8 F (36.6 C) (Oral)    Resp 14    Ht 6\' 1"  (1.854 m)    Wt 136.1 kg    SpO2 99%    BMI 39.58 kg/m  Physical Exam Vitals and nursing note reviewed.  Constitutional:      Appearance: Normal appearance.  HENT:     Head: Normocephalic and atraumatic.     Right Ear: External ear normal.     Left Ear: External ear normal.     Nose: Nose normal.     Mouth/Throat:     Mouth: Mucous membranes are moist.     Pharynx: Oropharynx is clear.  Eyes:     Extraocular Movements: Extraocular movements intact.     Conjunctiva/sclera: Conjunctivae normal.     Pupils: Pupils are equal, round, and reactive to light.  Cardiovascular:     Rate and Rhythm: Normal rate and regular rhythm.     Pulses: Normal pulses.     Heart sounds: Normal heart sounds.  Pulmonary:     Effort: Pulmonary effort is normal.     Breath sounds: Normal breath sounds.  Abdominal:     General: Abdomen is flat. Bowel sounds are normal.     Palpations: Abdomen is soft.  Musculoskeletal:        General: Normal range of motion.     Cervical back: Normal range of motion and neck supple.     Right lower leg: Edema present.     Left lower leg: Edema present.  Skin:    General: Skin is warm.     Capillary Refill: Capillary refill takes less than 2 seconds.  Neurological:     General: No focal deficit present.     Mental Status: He is alert and oriented to person, place, and time.  Psychiatric:        Mood and Affect: Mood normal.        Behavior: Behavior normal.    ED Results / Procedures / Treatments   Labs (all labs ordered are listed, but only abnormal results are displayed) Labs Reviewed  BASIC METABOLIC PANEL - Abnormal; Notable for the following components:      Result Value   Potassium 3.1 (*)    Glucose, Bld 115 (*)    BUN 68 (*)    Creatinine, Ser 4.06 (*)    GFR, Estimated 15 (*)    All other components within normal limits  BRAIN NATRIURETIC PEPTIDE - Abnormal; Notable for the following components:    B Natriuretic Peptide 342.0 (*)    All other components within normal limits  CBC WITH DIFFERENTIAL/PLATELET - Abnormal; Notable for the following components:   Hemoglobin 12.8 (*)    RDW 16.1 (*)    Platelets 148 (*)    All other components within normal limits  TROPONIN I (HIGH SENSITIVITY) - Abnormal; Notable for the following components:   Troponin I (High Sensitivity) 25 (*)    All other components within normal limits  RESP PANEL BY RT-PCR (FLU A&B, COVID) ARPGX2  TSH  MAGNESIUM  TROPONIN I (HIGH SENSITIVITY)    EKG None  Radiology DG Chest Port 1 View  Result Date: 08/14/2021 CLINICAL DATA:  Chest pain, shortness of breath. EXAM: PORTABLE CHEST 1 VIEW COMPARISON:  June 20, 2021. FINDINGS: Stable cardiomediastinal silhouette. Mild central pulmonary vascular congestion is noted with possible minimal bilateral pulmonary edema. Bony thorax is unremarkable. IMPRESSION: Stable cardiomegaly with mild central pulmonary vascular congestion and possible bilateral pulmonary edema. Electronically Signed   By: Marijo Conception M.D.   On: 08/14/2021 11:49    Procedures Procedures    Medications Ordered in ED Medications  potassium chloride SA (KLOR-CON M) CR tablet 40 mEq (has no administration in time range)  furosemide (LASIX) injection 40 mg (40 mg Intravenous Given 08/14/21 1119)    ED Course/ Medical Decision Making/ A&P                           Medical Decision Making  Pt's labs and CXR reviewed.  He does have some pulmonary edema and significant peripheral edema.  Pt given 40 mg lasix IV.    Pt is on 3L oxygen via [place] at baseline.  When he is sitting in bed, he does not need additional oxygen.  Pt does have some acute on chronic kidney disease as well.  K is 3.1, so he is given 40 meq po.  Mg is nl.  He is negative for Covid/flu.  Pt needs to get more fluid off, but his kidneys also need to be closely watched.  This can't be done as an outpatient.    Pt d/w Dr.  Manuella Ghazi ([facility]) for admission.  CRITICAL CARE Performed by: Isla Pence   Total critical care time: 30 minutes  Critical care time was exclusive of separately billable procedures and treating other patients.  Critical care was necessary to treat or prevent imminent or life-threatening deterioration.  Critical care was time spent personally by me on the following activities: development of treatment plan with patient and/or surrogate as well as nursing, discussions with consultants, evaluation of patient's response to treatment, examination of patient, obtaining history from patient or surrogate, ordering and performing treatments and interventions, ordering and review of laboratory studies, ordering and review of radiographic studies, pulse oximetry and re-evaluation of patient's condition.          Final Clinical Impression(s) / ED Diagnoses Final diagnoses:  Acute on chronic congestive heart failure, unspecified heart failure type ([place])  Bilateral lower extremity edema  Acute renal failure superimposed on stage 4 chronic kidney disease, unspecified acute renal failure type ([place])  Hypokalemia    Rx / DC Orders ED Discharge Orders     None         Isla Pence, MD 08/14/21 1245

## 2021-08-14 NOTE — ED Notes (Signed)
Patient increased SHOB with exertion. Patient able to transition self from toilet to bed. Patient belongings placed within reach.

## 2021-08-14 NOTE — ED Notes (Signed)
Report called to [facility] at this time.

## 2021-08-14 NOTE — ED Notes (Signed)
Patient ambulated 3 feet to bedside commode. Oxygen extension given to patient.

## 2021-08-14 NOTE — ED Notes (Signed)
Patient edcuated x2 in the importance of ekg leads, BP, and pulse ox to stay on the patient due to close monitoring, however patient continues to take BP cuff off and pulse oximetry.

## 2021-08-14 NOTE — ED Triage Notes (Signed)
Pt from home reported, PCP sent him here for fluid retention.  Pt alert and oriented, pt has shortness of breath with exertion.  Skin warm and dry

## 2021-08-14 NOTE — ED Notes (Signed)
Patient adjusted in bed at this time. Belongings within reach and patient's sweatpants placed on patient.

## 2021-08-14 NOTE — Progress Notes (Signed)
*  PRELIMINARY RESULTS* Echocardiogram 2D Echocardiogram has been performed with Definity.  Samuel Germany 08/14/2021, 3:58 PM

## 2021-08-14 NOTE — H&P (Addendum)
History and Physical    [name] [name] FFM:[id number] DOB: Nov 26, 1945 DOA: 08/14/2021  PCP: Nickola Major, MD   Patient coming from: Home  Chief Complaint: Dyspnea/edema  HPI: [name] [name] is a 76 y.o. male with medical history significant for atrial fibrillation status post DCCV 11/18, CAD s/p stenting, CKD stage IV, pulmonary hypertension, essential hypertension, morbid obesity, non-Hodgkin's lymphoma treated with chemotherapy and radiation treatment, diabetes mellitus type 2, OSA with chronic hypoxemic respiratory failure who presented to the ED with worsening abdominal and lower extremity edema.  He is also noticing some dyspnea on exertion and claims he has gained approximately 20 pounds since discharge on 11/22.  He states that his last weights are approximately 290 pounds and he was near 270 pounds previously.  He is supposedly compliant with his home medication of torsemide 40 mg twice daily.  He denies any fevers or chills or nausea or vomiting.  He was sent here by PCP office for further evaluation.   ED Course: Vital signs with some bradycardia noted with heart rate in the 40s and soft blood pressure readings.  Potassium 3.1.  BUN 68, creatinine 4.06.  Chest x-ray with cardiomegaly and pulmonary vascular congestion with bilateral pulmonary edema noted.  BNP is 342.  TSH 1.652.  Patient has been started on IV Lasix and is currently on 3 L nasal cannula oxygen which is near his baseline.  Review of Systems: Reviewed as noted above, otherwise negative.  Past Medical History:  Diagnosis Date   Atrial fibrillation ([place])    CAD (coronary artery disease)    DES to LAD June 2018   CKD (chronic kidney disease) stage 3, GFR 30-59 ml/min (HCC)    Essential hypertension    History of pneumonia    Hyperlipidemia    Hypothyroidism    LBBB (left bundle branch block)    Morbid obesity ([place])    Non Hodgkin's lymphoma ([place])    Status post XRT and chemotherapy   NSTEMI (non-ST elevated  myocardial infarction) [facility])    June 2018   Peripheral neuropathy    Pneumonia due to COVID-19 virus    Sleep apnea    Type 2 diabetes mellitus ([place])     Past Surgical History:  Procedure Laterality Date   CARDIOVERSION N/A 06/20/2021   Procedure: CARDIOVERSION;  Surgeon: Larey Dresser, MD;  Location: [facility];  Service: Cardiovascular;  Laterality: N/A;   CHOLECYSTECTOMY  1992   COLONOSCOPY N/A 09/03/2014   SLF:six colon polyps removed/small internal hemorrhoids   CORONARY STENT INTERVENTION N/A 01/21/2017   Procedure: Coronary Stent Intervention;  Surgeon: Nelva Bush, MD;  Location: [facility];  Service: Cardiovascular;  Laterality: N/A;   ESOPHAGOGASTRODUODENOSCOPY N/A 09/03/2014   SLF: mild gastritis/few gastric polyps   LEFT HEART CATH AND CORONARY ANGIOGRAPHY N/A 01/20/2017   Procedure: Left Heart Cath and Coronary Angiography;  Surgeon: Jettie Booze, MD;  Location: [facility];  Service: Cardiovascular;  Laterality: N/A;   RIGHT HEART CATH N/A 06/19/2021   Procedure: RIGHT HEART CATH;  Surgeon: Larey Dresser, MD;  Location: [facility];  Service: Cardiovascular;  Laterality: N/A;   TEE WITHOUT CARDIOVERSION N/A 06/20/2021   Procedure: TRANSESOPHAGEAL ECHOCARDIOGRAM (TEE);  Surgeon: Larey Dresser, MD;  [facility];  Service: Cardiovascular;  Laterality: N/A;   TOTAL KNEE ARTHROPLASTY  11/10/2011   Procedure: TOTAL KNEE ARTHROPLASTY;  Surgeon: Mauri Pole, MD;  Location: [facility];  Service: Orthopedics;  Laterality: Right;  TOTAL KNEE ARTHROPLASTY Left 05/24/2018   Procedure: LEFT TOTAL KNEE ARTHROPLASTY;  Surgeon: Melrose Nakayama, MD;  Location: [facility];  Service: Orthopedics;  Laterality: Left;     reports that he quit smoking about 22 months ago. His smoking use included cigarettes. He has a 30.00 pack-year smoking history. He has never used smokeless tobacco. He reports that he does not drink alcohol and does not use  drugs.  No Known Allergies  Family History  Problem Relation Age of Onset   Cancer Mother        breast and lung   Cancer Father        bladder   Cancer Maternal Uncle        prostate   Cancer Paternal Uncle        esophagus   Colon cancer Neg Hx     Prior to Admission medications   Medication Sig Start Date End Date Taking? Authorizing Provider  acetaminophen (TYLENOL) 325 MG tablet Take 2 tablets (650 mg total) by mouth every 6 (six) hours as needed for mild pain (or Fever >/= 101). 02/01/20  Yes Emokpae, Courage, MD  amiodarone (PACERONE) 200 MG tablet Take 200 mg by mouth daily.   Yes [provider]  amLODipine (NORVASC) 10 MG tablet Take 10 mg by mouth daily. 08/03/21  Yes [provider]  apixaban (ELIQUIS) 5 MG TABS tablet Take 1 tablet (5 mg total) by mouth 2 (two) times daily. This is a dose change 01/07/21  Yes Verta Ellen., NP  bisoprolol (ZEBETA) 5 MG tablet Take 5 mg by mouth daily. 08/03/21  Yes [provider]  fluticasone (FLONASE) 50 MCG/ACT nasal spray Place 2 sprays into both nostrils daily as needed for allergies.   Yes [provider]  insulin degludec (TRESIBA FLEXTOUCH) 200 UNIT/ML FlexTouch Pen Inject 25 Units into the skin daily.   Yes [provider]  ipratropium-albuterol (DUONEB) 0.5-2.5 (3) MG/3ML SOLN Inhale 3 mLs into the lungs every 4 (four) hours as needed (shortness of breath). 05/22/20  Yes [provider]  isosorbide mononitrate (IMDUR) 30 MG 24 hr tablet Take 1 tablet (30 mg total) by mouth daily. 12/04/20  Yes Satira Sark, MD  levocetirizine (XYZAL) 5 MG tablet Take 5 mg by mouth daily. 08/27/17  Yes [provider]  levothyroxine (SYNTHROID) 100 MCG tablet Take 100 mcg by mouth daily before breakfast. (Takes with 200 mcg tab for a total of 300 mcg once daily)   Yes [provider]  levothyroxine (SYNTHROID, LEVOTHROID) 200 MCG tablet Take 300 mcg by mouth daily before  breakfast. (takes with 174mcg tab for a total of 329mcg)   Yes [provider]  nitroGLYCERIN (NITROSTAT) 0.4 MG SL tablet Place 0.4 mg under the tongue every 5 (five) minutes as needed for chest pain.  12/27/17  Yes [provider]  OXYGEN Inhale 3-4 L into the lungs continuous.   Yes [provider]  senna-docusate (SENOKOT-S) 8.6-50 MG tablet Take 1 tablet by mouth daily as needed for mild constipation. 02/01/20  Yes [provider]  simvastatin (ZOCOR) 5 MG tablet Take 5 mg by mouth daily. 09/14/16  Yes [provider]  sodium chloride (OCEAN) 0.65 % SOLN nasal spray Place 1 spray into both nostrils as needed for congestion.   Yes [provider]  SYMBICORT 160-4.5 MCG/ACT inhaler Inhale 2 puffs into the lungs daily as needed (shortness of breath). 12/22/19  Yes [provider]  torsemide (DEMADEX) 20 MG  tablet Take 2 tablets (40 mg total) by mouth 2 (two) times daily. 07/15/21  Yes Rosita Fire, Brittainy M, PA-C  potassium chloride SA (KLOR-CON) 10 MEQ tablet Take 1 tablet (10 mEq total) by mouth daily. Patient not taking: Reported on 08/14/2021 01/24/21   Irwin Brakeman L, MD  insulin aspart (NOVOLOG) 100 UNIT/ML injection Inject 0-15 Units into the skin 3 (three) times daily with meals. Patient not taking: No sig reported 12/31/20 01/22/21  Deatra James, MD    Physical Exam: Vitals:   08/14/21 1242 08/14/21 1243 08/14/21 1244 08/14/21 1251  BP:    (!) 114/58  Pulse:   90 (!) 44  Resp: 14 19 13 16   Temp:      TempSrc:      SpO2:   97% 97%  Weight:      Height:        Constitutional: NAD, calm, comfortable Vitals:   08/14/21 1242 08/14/21 1243 08/14/21 1244 08/14/21 1251  BP:    (!) 114/58  Pulse:   90 (!) 44  Resp: 14 19 13 16   Temp:      TempSrc:      SpO2:   97% 97%  Weight:      Height:       Eyes: lids and conjunctivae normal Neck: normal, supple Respiratory: clear to auscultation bilaterally. Normal  respiratory effort. No accessory muscle use.  Cardiovascular: Regular rate and rhythm, no murmurs. Abdomen: no tenderness, no distention. Bowel sounds positive.  Musculoskeletal:  No edema. Skin: no rashes, lesions, ulcers.  Psychiatric: Flat affect  Labs on Admission: I have personally reviewed following labs and imaging studies  CBC: Recent Labs  Lab 08/14/21 1040  WBC 5.3  NEUTROABS 3.4  HGB 12.8*  HCT 39.1  MCV 88.5  PLT 188*   Basic Metabolic Panel: Recent Labs  Lab 08/14/21 1040  NA 141  K 3.1*  CL 105  CO2 25  GLUCOSE 115*  BUN 68*  CREATININE 4.06*  CALCIUM 8.9  MG 2.3   GFR: Estimated Creatinine Clearance: 23.1 mL/min (A) (by C-G formula based on SCr of 4.06 mg/dL (H)). Liver Function Tests: No results for input(s): AST, ALT, ALKPHOS, BILITOT, PROT, ALBUMIN in the last 168 hours. No results for input(s): LIPASE, AMYLASE in the last 168 hours. No results for input(s): AMMONIA in the last 168 hours. Coagulation Profile: No results for input(s): INR, PROTIME in the last 168 hours. Cardiac Enzymes: No results for input(s): CKTOTAL, CKMB, CKMBINDEX, TROPONINI in the last 168 hours. BNP (last 3 results) No results for input(s): PROBNP in the last 8760 hours. HbA1C: No results for input(s): HGBA1C in the last 72 hours. CBG: No results for input(s): GLUCAP in the last 168 hours. Lipid Profile: No results for input(s): CHOL, HDL, LDLCALC, TRIG, CHOLHDL, LDLDIRECT in the last 72 hours. Thyroid Function Tests: Recent Labs    08/14/21 1040  TSH 1.652   Anemia Panel: No results for input(s): VITAMINB12, FOLATE, FERRITIN, TIBC, IRON, RETICCTPCT in the last 72 hours. Urine analysis:    Component Value Date/Time   COLORURINE YELLOW 11/19/2020 1736   APPEARANCEUR CLEAR 11/19/2020 1736   LABSPEC 1.015 11/19/2020 1736   PHURINE 6.0 11/19/2020 1736   GLUCOSEU NEGATIVE 11/19/2020 1736   HGBUR NEGATIVE 11/19/2020 1736   BILIRUBINUR NEGATIVE 11/19/2020 1736    KETONESUR NEGATIVE 11/19/2020 1736   PROTEINUR >=300 (A) 11/19/2020 1736   UROBILINOGEN 4.0 (H) 07/23/2014 1356   NITRITE NEGATIVE 11/19/2020 1736   LEUKOCYTESUR NEGATIVE 11/19/2020 1736  Radiological Exams on Admission: DG Chest Port 1 View  Result Date: 08/14/2021 CLINICAL DATA:  Chest pain, shortness of breath. EXAM: PORTABLE CHEST 1 VIEW COMPARISON:  June 20, 2021. FINDINGS: Stable cardiomediastinal silhouette. Mild central pulmonary vascular congestion is noted with possible minimal bilateral pulmonary edema. Bony thorax is unremarkable. IMPRESSION: Stable cardiomegaly with mild central pulmonary vascular congestion and possible bilateral pulmonary edema. Electronically Signed   By: Marijo Conception M.D.   On: 08/14/2021 11:49    EKG: Independently reviewed. Junctional rhythm 48bpm.  Assessment/Plan Principal Problem:   Acute on chronic diastolic (congestive) heart failure (HCC)    Acute on chronic diastolic congestive heart failure exacerbation -Noted to be compliant on torsemide 40 mg twice daily -Continue on Lasix 40 mg twice daily for now and monitor blood pressures carefully.  May need to transition to Lasix drip if not tolerating well. -Monitor strict I's and O's -2D echocardiogram -Appreciate cardiology consultation recommendations.  Transfer to [facility] for heart failure team evaluation  AKI on CKD stage IV -Possibly cardiorenal, monitor with ongoing diuresis -Baseline creatinine 2.6-2.7 -May require nephrology assistance if diuresis is inadequate  Hypokalemia -Replete aggressively and continue to monitor a.m. labs  Pulmonary hypertension -Moderate to severe and likely related to OHS/OSA -VQ scan negative for chronic PEs  Atrial fibrillation status post DCCV 11/18 -Currently in junctional rhythm -Monitor on telemetry -Continue amiodarone and apixaban  CAD with prior NSTEMI -Continue statin and hold Imdur given softer blood pressure readings -No aspirin  given Eliquis use  Hypertension -Continue to monitor closely with aggressive diuresis -Holding Imdur due to soft blood pressure readings  Type 2 diabetes -Recent hemoglobin A1c 5.8% -Continue SSI and glargine  Hypothyroidism -Continue Synthroid  Obesity -Lifestyle changes outpatient   DVT prophylaxis: Eliquis Code Status: Full Family Communication: None at bedside, patient will call Disposition Plan: Transfer to [facility] for heart failure team evaluation Consults called: Cardiology Dr. Domenic Polite and heart failure team Admission status: Inpatient, telemetry    Taelynn Mcelhannon D Shaunika Italiano DO [facility]  If 7PM-7AM, please contact night-coverage [email or web address]  08/14/2021, 12:57 PM

## 2021-08-14 NOTE — ED Notes (Signed)
Attempted to call report to [place] and nurse unavailable at this time.Number left and secretary reports receiving nurse would call reporting nurse back.

## 2021-08-15 DIAGNOSIS — I509 Heart failure, unspecified: Secondary | ICD-10-CM

## 2021-08-15 DIAGNOSIS — N179 Acute kidney failure, unspecified: Secondary | ICD-10-CM | POA: Diagnosis not present

## 2021-08-15 DIAGNOSIS — R600 Localized edema: Secondary | ICD-10-CM

## 2021-08-15 DIAGNOSIS — I5033 Acute on chronic diastolic (congestive) heart failure: Secondary | ICD-10-CM

## 2021-08-15 DIAGNOSIS — E876 Hypokalemia: Secondary | ICD-10-CM

## 2021-08-15 DIAGNOSIS — R001 Bradycardia, unspecified: Secondary | ICD-10-CM

## 2021-08-15 DIAGNOSIS — I4819 Other persistent atrial fibrillation: Secondary | ICD-10-CM | POA: Diagnosis not present

## 2021-08-15 LAB — CBC
HCT: 35.5 % — ABNORMAL LOW (ref 39.0–52.0)
Hemoglobin: 11.4 g/dL — ABNORMAL LOW (ref 13.0–17.0)
MCH: 28.4 pg (ref 26.0–34.0)
MCHC: 32.1 g/dL (ref 30.0–36.0)
MCV: 88.3 fL (ref 80.0–100.0)
Platelets: 138 10*3/uL — ABNORMAL LOW (ref 150–400)
RBC: 4.02 MIL/uL — ABNORMAL LOW (ref 4.22–5.81)
RDW: 16 % — ABNORMAL HIGH (ref 11.5–15.5)
WBC: 5 10*3/uL (ref 4.0–10.5)
nRBC: 0 % (ref 0.0–0.2)

## 2021-08-15 LAB — BASIC METABOLIC PANEL
Anion gap: 12 (ref 5–15)
BUN: 62 mg/dL — ABNORMAL HIGH (ref 8–23)
CO2: 22 mmol/L (ref 22–32)
Calcium: 8.5 mg/dL — ABNORMAL LOW (ref 8.9–10.3)
Chloride: 107 mmol/L (ref 98–111)
Creatinine, Ser: 3.97 mg/dL — ABNORMAL HIGH (ref 0.61–1.24)
GFR, Estimated: 15 mL/min — ABNORMAL LOW (ref >60)
Glucose, Bld: 60 mg/dL — ABNORMAL LOW (ref 70–99)
Potassium: 3.1 mmol/L — ABNORMAL LOW (ref 3.5–5.1)
Sodium: 141 mmol/L (ref 135–145)

## 2021-08-15 LAB — MAGNESIUM: Magnesium: 2.2 mg/dL (ref 1.7–2.4)

## 2021-08-15 LAB — HEMOGLOBIN A1C
Hgb A1c MFr Bld: 5 % (ref 4.8–5.6)
Mean Plasma Glucose: 96.8 mg/dL

## 2021-08-15 MED ORDER — FUROSEMIDE 10 MG/ML IJ SOLN
20.0000 mg/h | INTRAVENOUS | Status: DC
  Administered 2021-08-15: 12 mg/h via INTRAVENOUS
  Administered 2021-08-16: 15 mg/h via INTRAVENOUS
  Administered 2021-08-16: 12 mg/h via INTRAVENOUS
  Administered 2021-08-17: 20 mg/h via INTRAVENOUS
  Administered 2021-08-17: 15 mg/h via INTRAVENOUS
  Administered 2021-08-18 – 2021-08-19 (×2): 20 mg/h via INTRAVENOUS
  Filled 2021-08-15 (×11): qty 20

## 2021-08-15 MED ORDER — INSULIN GLARGINE-YFGN 100 UNIT/ML ~~LOC~~ SOLN
18.0000 [IU] | Freq: Every day | SUBCUTANEOUS | Status: DC
  Administered 2021-08-15: 18 [IU] via SUBCUTANEOUS
  Filled 2021-08-15 (×2): qty 0.18

## 2021-08-15 MED ORDER — POTASSIUM CHLORIDE CRYS ER 20 MEQ PO TBCR: 40.0000 meq | EXTENDED_RELEASE_TABLET | Freq: Once | ORAL | Status: DC

## 2021-08-15 MED ORDER — INSULIN ASPART 100 UNIT/ML IJ SOLN
0.0000 [IU] | Freq: Three times a day (TID) | INTRAMUSCULAR | Status: DC
  Administered 2021-08-16: 2 [IU] via SUBCUTANEOUS
  Administered 2021-08-17 – 2021-08-20 (×3): 1 [IU] via SUBCUTANEOUS

## 2021-08-15 MED ORDER — POTASSIUM CHLORIDE CRYS ER 20 MEQ PO TBCR
40.0000 meq | EXTENDED_RELEASE_TABLET | Freq: Once | ORAL | Status: AC
  Administered 2021-08-15: 40 meq via ORAL
  Filled 2021-08-15: qty 2

## 2021-08-15 MED ORDER — MILRINONE LACTATE IN DEXTROSE 20-5 MG/100ML-% IV SOLN
0.1250 ug/kg/min | INTRAVENOUS | Status: DC
  Administered 2021-08-15 – 2021-08-18 (×9): 0.25 ug/kg/min via INTRAVENOUS
  Administered 2021-08-20: 0.125 ug/kg/min via INTRAVENOUS
  Filled 2021-08-15 (×12): qty 100

## 2021-08-15 MED ORDER — AMIODARONE HCL 200 MG PO TABS
200.0000 mg | ORAL_TABLET | Freq: Every day | ORAL | Status: DC
  Administered 2021-08-15 – 2021-08-20 (×6): 200 mg via ORAL
  Filled 2021-08-15 (×8): qty 1

## 2021-08-15 MED ORDER — FUROSEMIDE 10 MG/ML IJ SOLN: 80.0000 mg | Freq: Two times a day (BID) | INTRAMUSCULAR | Status: DC

## 2021-08-15 NOTE — Progress Notes (Signed)
PROGRESS NOTE    [name] [name]  ASN:[id number] DOB: 07/29/1946 DOA: 08/14/2021 PCP: Nickola Major, MD   Chief Complaint  Patient presents with   Shortness of Breath   Leg Swelling    Brief Narrative:   : [name] [name] is a 76 y.o. male with medical history significant for atrial fibrillation status post DCCV 11/18, CAD s/p stenting, CKD stage IV, pulmonary hypertension, essential hypertension, morbid obesity, non-Hodgkin's lymphoma treated with chemotherapy and radiation treatment, diabetes mellitus type 2, OSA with chronic hypoxemic respiratory failure who presented to the ED with worsening abdominal and lower extremity edema.  He is also noticing some dyspnea on exertion and claims he has gained approximately 20 pounds since discharge on 11/22.  Assessment & Plan:   Principal Problem:   Acute on chronic diastolic (congestive) heart failure (HCC)   Acute on chronic diastolic CHF:  Diuresing with IV lasix gtt.  Continue with strict intake and output, daily weights.  2 D echocardiogram reviewed showed LVEF is 45 to 50%. The left ventricle has mildly decreased function. The left ventricle demonstrates global hypokinesis. The left ventricular internal cavity size was mildly dilated. There is mild left ventricular hypertrophy. Left ventricular diastolic parameters are consistent with Grade III diastolic dysfunction (restrictive). Cardiology consulted and recommendations given.   PAF;  Rate controlled.  S/p cardioversion,  On eliquis for anticoagulation.  Rate controlled with amiodarone.    Acute on Stage 4 CKD;  - possibly cardio renal.  Baseline creatinine around 2.7.  Creatinine improving to 3.9.    Hypokalemia Replaced.    Hyperlipidemia:  Resume zocor.    Hypothyroidism  Resume synthroid.    Diabetes mellitus with hypoglycemia  Insulin dependent.  CBG (last 3)  No results for input(s): GLUCAP in the last 72 hours. Decrease the Semglee to 18 units from  25 units.  Get Hemoglobin A1c.    Bradycardia:  Sinus bradycardia.    Non-Hodgkin's lymphoma  treated with chemotherapy and radiation treatment,   Chronic respiratory failure in the setting of chronic diastolic heart failure and OSA.    DVT prophylaxis: Eliquis Code Status: Full code.  Family Communication: none at bedside.  Disposition:   Status is: Inpatient  Remains inpatient appropriate because: IV diuresis.        Consultants:  EP  Cardiology.   Procedures: none.   Antimicrobials: none.   Subjective: Breathing has improved.   Objective: Vitals:   08/14/21 2052 08/15/21 0428 08/15/21 0831 08/15/21 1230  BP:  (!) 102/55  106/60  Pulse:  (!) 47  (!) 48  Resp:    18  Temp:  98.2 F (36.8 C)  97.7 F (36.5 C)  TempSrc:  Oral  Oral  SpO2:  93% 97% 92%  Weight: 136 kg (!) 141 kg    Height: 6\' 1"  (1.854 m)       Intake/Output Summary (Last 24 hours) at 08/15/2021 1627 Last data filed at 08/15/2021 1357 Gross per 24 hour  Intake 606 ml  Output 1325 ml  Net -719 ml   Filed Weights   08/14/21 1029 08/14/21 2052 08/15/21 0428  Weight: 136.1 kg 136 kg (!) 141 kg    Examination:  General exam: Appears calm and comfortable  Respiratory system: Clear to auscultation. Respiratory effort normal. Cardiovascular system: S1 & S2 heard, RRR. No JVD,  pedal edema present.  Gastrointestinal system: Abdomen is nondistended, soft and nontender. Normal bowel sounds heard. Central nervous system: Alert and oriented. No focal neurological deficits. Extremities:  Symmetric 5 x 5 power. Skin: No rashes, lesions or ulcers Psychiatry: Mood & affect appropriate.     Data Reviewed: I have personally reviewed following labs and imaging studies  CBC: Recent Labs  Lab 08/14/21 1040 08/15/21 0332  WBC 5.3 5.0  NEUTROABS 3.4  --   HGB 12.8* 11.4*  HCT 39.1 35.5*  MCV 88.5 88.3  PLT 148* 138*    Basic Metabolic Panel: Recent Labs  Lab 08/14/21 1040  08/15/21 0332  NA 141 141  K 3.1* 3.1*  CL 105 107  CO2 25 22  GLUCOSE 115* 60*  BUN 68* 62*  CREATININE 4.06* 3.97*  CALCIUM 8.9 8.5*  MG 2.3 2.2    GFR: Estimated Creatinine Clearance: 24.1 mL/min (A) (by C-G formula based on SCr of 3.97 mg/dL (H)).  Liver Function Tests: No results for input(s): AST, ALT, ALKPHOS, BILITOT, PROT, ALBUMIN in the last 168 hours.  CBG: No results for input(s): GLUCAP in the last 168 hours.   Recent Results (from the past 240 hour(s))  Resp Panel by RT-PCR (Flu A&B, Covid) Nasopharyngeal Swab     Status: None   Collection Time: 08/14/21 11:21 AM   Specimen: Nasopharyngeal Swab; Nasopharyngeal(NP) swabs in vial transport medium  Result Value Ref Range Status   SARS Coronavirus 2 by RT PCR NEGATIVE NEGATIVE Final    Comment: (NOTE) SARS-CoV-2 target nucleic acids are NOT DETECTED.  The SARS-CoV-2 RNA is generally detectable in upper respiratory specimens during the acute phase of infection. The lowest concentration of SARS-CoV-2 viral copies this assay can detect is 138 copies/mL. A negative result does not preclude SARS-Cov-2 infection and should not be used as the sole basis for treatment or other patient management decisions. A negative result may occur with  improper specimen collection/handling, submission of specimen other than nasopharyngeal swab, presence of viral mutation(s) within the areas targeted by this assay, and inadequate number of viral copies(<138 copies/mL). A negative result must be combined with clinical observations, patient history, and epidemiological information. The expected result is Negative.  Fact Sheet for Patients:  EntrepreneurPulse.com.au  Fact Sheet for Healthcare Providers:  IncredibleEmployment.be  This test is no t yet approved or cleared by the [place] FDA and  has been authorized for detection and/or diagnosis of SARS-CoV-2 by FDA under an Emergency Use  Authorization (EUA). This EUA will remain  in effect (meaning this test can be used) for the duration of the COVID-19 declaration under Section 564(b)(1) of the Act, 21 U.S.C.section 360bbb-3(b)(1), unless the authorization is terminated  or revoked sooner.       Influenza A by PCR NEGATIVE NEGATIVE Final   Influenza B by PCR NEGATIVE NEGATIVE Final    Comment: (NOTE) The Xpert Xpress SARS-CoV-2/FLU/RSV plus assay is intended as an aid in the diagnosis of influenza from Nasopharyngeal swab specimens and should not be used as a sole basis for treatment. Nasal washings and aspirates are unacceptable for Xpert Xpress SARS-CoV-2/FLU/RSV testing.  Fact Sheet for Patients: EntrepreneurPulse.com.au  Fact Sheet for Healthcare Providers: IncredibleEmployment.be  This test is not yet approved or cleared by the [place] FDA and has been authorized for detection and/or diagnosis of SARS-CoV-2 by FDA under an Emergency Use Authorization (EUA). This EUA will remain in effect (meaning this test can be used) for the duration of the COVID-19 declaration under Section 564(b)(1) of the Act, 21 U.S.C. section 360bbb-3(b)(1), unless the authorization is terminated or revoked.  Performed at [facility], [address], [place], [place] [address]  Radiology Studies: DG Chest Port 1 View  Result Date: 08/14/2021 CLINICAL DATA:  Chest pain, shortness of breath. EXAM: PORTABLE CHEST 1 VIEW COMPARISON:  June 20, 2021. FINDINGS: Stable cardiomediastinal silhouette. Mild central pulmonary vascular congestion is noted with possible minimal bilateral pulmonary edema. Bony thorax is unremarkable. IMPRESSION: Stable cardiomegaly with mild central pulmonary vascular congestion and possible bilateral pulmonary edema. Electronically Signed   By: Marijo Conception M.D.   On: 08/14/2021 11:49   ECHOCARDIOGRAM COMPLETE  Result Date: 08/14/2021     ECHOCARDIOGRAM REPORT   Patient Name:   WYNNE JURY Date of Exam: 08/14/2021 Medical Rec #:  [id number]     Height:       73.0 in Accession #:    [id number]    Weight:       300.0 lb Date of Birth:  Apr 07, 1946     BSA:          2.556 m Patient Age:    4 years      BP:           135/58 mmHg Patient Gender: M             HR:           83 bpm. Exam Location:  [facility] Procedure: 2D Echo, Cardiac Doppler and Color Doppler Indications:    CHF-Acute Diastolic J57.01  History:        Patient has prior history of Echocardiogram examinations, most                 recent 06/20/2021. CHF, CAD and Previous Myocardial Infarction,                 COPD, Arrythmias:Atrial Fibrillation and LBBB; Risk                 Factors:Diabetes, Hypertension, Former Smoker and Dyslipidemia.                 Hx of Pneumonia due to COVID-19 virus, Non Hodgkin's lymphoma                 (Friend) (From Hx).  Sonographer:    Alvino Chapel RCS Referring Phys: [id number] [place]  1. Left ventricular ejection fraction, by estimation, is 45 to 50%. The left ventricle has mildly decreased function. The left ventricle demonstrates global hypokinesis. The left ventricular internal cavity size was mildly dilated. There is mild left ventricular hypertrophy. Left ventricular diastolic parameters are consistent with Grade III diastolic dysfunction (restrictive).  2. Right ventricular systolic function is mildly to moderately reduced. The right ventricular size is mildly enlarged. There is moderately elevated pulmonary artery systolic pressure. The estimated right ventricular systolic pressure is 00.9 mmHg.  3. Left atrial size was moderately dilated.  4. Right atrial size was moderately dilated.  5. Tricuspid valve regurgitation is mild to moderate.  6. The aortic valve is tricuspid. There is mild calcification of the aortic valve. Aortic valve regurgitation is not visualized. Aortic valve mean gradient measures 9.0 mmHg.  7. The inferior vena  cava is dilated in size with >50% respiratory variability, suggesting right atrial pressure of 8 mmHg.  8. The mitral valve is grossly normal. Trivial mitral valve regurgitation. Comparison(s): No significant change from prior study. Prior images reviewed side by side. FINDINGS  Left Ventricle: Left ventricular ejection fraction, by estimation, is 45 to 50%. The left ventricle has mildly decreased function. The left ventricle demonstrates global hypokinesis. Definity contrast agent was given  IV to delineate the left ventricular  endocardial borders. The left ventricular internal cavity size was mildly dilated. There is mild left ventricular hypertrophy. Left ventricular diastolic parameters are consistent with Grade III diastolic dysfunction (restrictive). Right Ventricle: The right ventricular size is mildly enlarged. No increase in right ventricular wall thickness. Right ventricular systolic function is mildly reduced. There is moderately elevated pulmonary artery systolic pressure. The tricuspid regurgitant velocity is 3.41 m/s, and with an assumed right atrial pressure of 8 mmHg, the estimated right ventricular systolic pressure is 09.8 mmHg. Left Atrium: Left atrial size was moderately dilated. Right Atrium: Right atrial size was moderately dilated. Pericardium: There is no evidence of pericardial effusion. Mitral Valve: The mitral valve is grossly normal. Trivial mitral valve regurgitation. Tricuspid Valve: The tricuspid valve is grossly normal. Tricuspid valve regurgitation is mild to moderate. Aortic Valve: The aortic valve is tricuspid. There is mild calcification of the aortic valve. There is mild aortic valve annular calcification. Aortic valve regurgitation is not visualized. Aortic valve mean gradient measures 9.0 mmHg. Aortic valve peak gradient measures 14.7 mmHg. Aortic valve area, by VTI measures 1.95 cm. Pulmonic Valve: The pulmonic valve was grossly normal. Pulmonic valve regurgitation is mild.  Aorta: The aortic root is normal in size and structure. Venous: The inferior vena cava is dilated in size with greater than 50% respiratory variability, suggesting right atrial pressure of 8 mmHg. IAS/Shunts: No atrial level shunt detected by color flow Doppler.  LEFT VENTRICLE PLAX 2D LVIDd:         6.20 cm LVIDs:         4.80 cm LV PW:         1.00 cm LV IVS:        1.10 cm LVOT diam:     2.00 cm LV SV:         108 LV SV Index:   42 LVOT Area:     3.14 cm  RIGHT VENTRICLE TAPSE (M-mode): 2.4 cm LEFT ATRIUM              Index        RIGHT ATRIUM           Index LA diam:        4.70 cm  1.84 cm/m   RA Area:     31.00 cm LA Vol (A2C):   145.0 ml 56.72 ml/m  RA Volume:   123.00 ml 48.12 ml/m LA Vol (A4C):   116.0 ml 45.38 ml/m LA Biplane Vol: 132.0 ml 51.64 ml/m  AORTIC VALVE AV Area (Vmax):    2.13 cm AV Area (Vmean):   2.03 cm AV Area (VTI):     1.95 cm AV Vmax:           192.00 cm/s AV Vmean:          142.000 cm/s AV VTI:            0.555 m AV Peak Grad:      14.7 mmHg AV Mean Grad:      9.0 mmHg LVOT Vmax:         130.00 cm/s LVOT Vmean:        91.800 cm/s LVOT VTI:          0.344 m LVOT/AV VTI ratio: 0.62  AORTA Ao Root diam: 3.80 cm MITRAL VALVE                TRICUSPID VALVE MV Area (PHT): 4.39 cm     TR Peak  grad:   46.5 mmHg MV Decel Time: 173 msec     TR Vmax:        341.00 cm/s MV E velocity: 127.00 cm/s MV A velocity: 58.00 cm/s   SHUNTS MV E/A ratio:  2.19         Systemic VTI:  0.34 m                             Systemic Diam: 2.00 cm Rozann Lesches MD Electronically signed by Rozann Lesches MD Signature Date/Time: 08/14/2021/5:00:25 PM    Final         Scheduled Meds:  amiodarone  200 mg Oral Daily   apixaban  5 mg Oral BID   insulin glargine-yfgn  25 Units Subcutaneous QHS   levothyroxine  300 mcg Oral QAC breakfast   loratadine  10 mg Oral Daily   mometasone-formoterol  2 puff Inhalation BID   simvastatin  5 mg Oral Daily   sodium chloride flush  3 mL Intravenous Q12H    Continuous Infusions:  sodium chloride     furosemide (LASIX) 200 mg in dextrose 5% 100 mL (2mg /mL) infusion 12 mg/hr (08/15/21 1506)   milrinone 0.25 mcg/kg/min (08/15/21 1354)     LOS: 1 day        Hosie Poisson, MD [facility]   To contact the attending provider between 7A-7P or the covering provider during after hours 7P-7A, please log into the web site [email or web address] and access using universal Confluence password for that web site. If you do not have the password, please call the hospital operator.  08/15/2021, 4:27 PM

## 2021-08-15 NOTE — Consult Note (Addendum)
Advanced Heart Failure Team Consult Note   Primary Physician: Nickola Major, MD PCP-Cardiologist:  Rozann Lesches, MD  Reason for Consultation: A/C HFmEF  HPI:    [name] [name] is seen today for evaluation of A/C HFmEF at the request of Dr Domenic Polite.   [name] [name] is a 76 year old with history of  chronic diastolic CHF, chronic hypoxemic respiratory failure/COPD, CKD stage 4, and persistent atrial fibrillation.  He has had multiple admissions this year for CHF.  He appears to have been in atrial fibrillation since around 12/21, DCCV never attempted.  He had NSTEMI in 6/18, culprit lesion was occluded OM1 that was not intervened on, he had DES to 80% mLAD.  No recent chest pain.  He has CKD stage 4, creatinine has been in the 2.5 range recently.  He is on 2 L home oxygen.  Carries history of COPD but never had PFTs.  ?OHS/OSA.  Most recent echo in 5/22 with EF 55-60%, mild RV enlargement, normal RV systolic function.    He was admitted on 11/9 with weight gain and marked edema as well as hypoxemia.  He was treated with prednisone for 5 days for ?component of COPDE.  Hospital course c/b A fib and AKI. He was started on Lasix 80 mg IV bid. RHC showed mildly elevated right and left heart filling pressures, mod-severe PAH w/ PVR 4 WU and low cardiac index, 1.94. He was diuresed further. Creatinine plateaued ~3.0. Underwent TEE//DC-CV with restoration of NSR. EF on TEE 45-50%, RV mildly reduced.  He was discharged home on torsemide 60 mg bid. Amio 400 mg daily x 2 weeks then 200 mg daily (outpatient PFTs recommended) + Eliquis for a/c. D/c wt 274 lb.   He was seen in the HF clinic 07/2021. Torsemide had been cut back to 40/20 by Nephrology.  Weight at that time was 290 pound.   Presented to [facility] with increased shortness of breath and lower extremity edema.CXR with vascular congestion. BNP 342, Started on IV lasix due to CKD Stage IV and marked volume overload transferred to [facility] for Advanced Heart  Failure consultation.   Creatinine 4.1>4 BUN 68>68  Echo EF 45-50% RV moderately reduced.   Review of Systems: [y] = yes, [ ]  = no   General: Weight gain [Y ]; Weight loss [ ] ; Anorexia [ ] ; Fatigue [Y ]; Fever [ ] ; Chills [ ] ; Weakness [ ]   Cardiac: Chest pain/pressure [ ] ; Resting SOB [ ] ; Exertional SOB [ Y]; Orthopnea [ Y]; Pedal Edema [ Y]; Palpitations [ ] ; Syncope [ ] ; Presyncope [ ] ; Paroxysmal nocturnal dyspnea[ ]   Pulmonary: Cough [ ] ; Wheezing[ ] ; Hemoptysis[ ] ; Sputum [ ] ; Snoring [ ]   GI: Vomiting[ ] ; Dysphagia[ ] ; Melena[ ] ; Hematochezia [ ] ; Heartburn[ ] ; Abdominal pain [ ] ; Constipation [ ] ; Diarrhea [ ] ; BRBPR [ ]   GU: Hematuria[ ] ; Dysuria [ ] ; Nocturia[ ]   Vascular: Pain in legs with walking [ ] ; Pain in feet with lying flat [ ] ; Non-healing sores [ ] ; Stroke [ ] ; TIA [ ] ; Slurred speech [ ] ;  Neuro: Headaches[ ] ; Vertigo[ ] ; Seizures[ ] ; Paresthesias[ ] ;Blurred vision [ ] ; Diplopia [ ] ; Vision changes [ ]   Ortho/Skin: Arthritis [ ] ; Joint pain [ Y]; Muscle pain [ ] ; Joint swelling [ ] ; Back Pain [ Y]; Rash [ ]   Psych: Depression[ ] ; Anxiety[ ]   Heme: Bleeding problems [ ] ; Clotting disorders [ ] ; Anemia [ ]   Endocrine: Diabetes [ ] ;  Thyroid dysfunction[ ]   Home Medications Prior to Admission medications   Medication Sig Start Date End Date Taking? Authorizing Provider  acetaminophen (TYLENOL) 325 MG tablet Take 2 tablets (650 mg total) by mouth every 6 (six) hours as needed for mild pain (or Fever >/= 101). 02/01/20  Yes Emokpae, Courage, MD  amiodarone (PACERONE) 200 MG tablet Take 200 mg by mouth daily.   Yes [provider]  amLODipine (NORVASC) 10 MG tablet Take 10 mg by mouth daily. 08/03/21  Yes [provider]  apixaban (ELIQUIS) 5 MG TABS tablet Take 1 tablet (5 mg total) by mouth 2 (two) times daily. This is a dose change 01/07/21  Yes Verta Ellen., NP  bisoprolol (ZEBETA) 5 MG tablet Take 5 mg by mouth daily. 08/03/21  Yes [provider]  fluticasone (FLONASE) 50 MCG/ACT nasal spray Place 2 sprays into both nostrils daily as needed for allergies.   Yes [provider]  insulin degludec (TRESIBA FLEXTOUCH) 200 UNIT/ML FlexTouch Pen Inject 25 Units into the skin daily.   Yes [provider]  ipratropium-albuterol (DUONEB) 0.5-2.5 (3) MG/3ML SOLN Inhale 3 mLs into the lungs every 4 (four) hours as needed (shortness of breath). 05/22/20  Yes [provider]  isosorbide mononitrate (IMDUR) 30 MG 24 hr tablet Take 1 tablet (30 mg total) by mouth daily. 12/04/20  Yes Satira Sark, MD  levocetirizine (XYZAL) 5 MG tablet Take 5 mg by mouth daily. 08/27/17  Yes [provider]  levothyroxine (SYNTHROID) 100 MCG tablet Take 100 mcg by mouth daily before breakfast. (Takes with 200 mcg tab for a total of 300 mcg once daily)   Yes [provider]  levothyroxine (SYNTHROID, LEVOTHROID) 200 MCG tablet Take 300 mcg by mouth daily before breakfast. (takes with 139mcg tab for a total of 334mcg)   Yes [provider]  nitroGLYCERIN (NITROSTAT) 0.4 MG SL tablet Place 0.4 mg under the tongue every 5 (five) minutes as needed for chest pain.  12/27/17  Yes [provider]  OXYGEN Inhale 3-4 L into the lungs continuous.   Yes [provider]  senna-docusate (SENOKOT-S) 8.6-50 MG tablet Take 1 tablet by mouth daily as needed for mild constipation. 02/01/20  Yes [provider]  simvastatin (ZOCOR) 5 MG tablet Take 5 mg by mouth daily. 09/14/16  Yes [provider]  sodium chloride (OCEAN) 0.65 % SOLN nasal spray Place 1 spray into both nostrils as needed for congestion.   Yes [provider]  SYMBICORT 160-4.5 MCG/ACT inhaler Inhale 2 puffs into the lungs daily as needed (shortness of breath). 12/22/19  Yes [provider]  torsemide (DEMADEX) 20 MG tablet Take 2 tablets (40 mg total) by mouth 2 (two) times daily. 07/15/21  Yes Rosita Fire,  Brittainy M, PA-C  potassium chloride SA (KLOR-CON) 10 MEQ tablet Take 1 tablet (10 mEq total) by mouth daily. Patient not taking: Reported on 08/14/2021 01/24/21   Irwin Brakeman L, MD  insulin aspart (NOVOLOG) 100 UNIT/ML injection Inject 0-15 Units into the skin 3 (three) times daily with meals. Patient not taking: No sig reported 12/31/20 01/22/21  Deatra James, MD    Past Medical History: Past Medical History:  Diagnosis Date   Atrial fibrillation [facility])    CAD (coronary artery disease)    DES to LAD June 2018   CKD (chronic kidney disease) stage 3, GFR 30-59 ml/min (HCC)    Essential hypertension    History of pneumonia  Hyperlipidemia    Hypothyroidism    LBBB (left bundle branch block)    Morbid obesity ([place])    Non Hodgkin's lymphoma ([place])    Status post XRT and chemotherapy   NSTEMI (non-ST elevated myocardial infarction) [facility])    June 2018   Peripheral neuropathy    Pneumonia due to COVID-19 virus    Sleep apnea    Type 2 diabetes mellitus ([place])     Past Surgical History: Past Surgical History:  Procedure Laterality Date   CARDIOVERSION N/A 06/20/2021   Procedure: CARDIOVERSION;  Surgeon: Larey Dresser, MD;  Location: [place];  Service: Cardiovascular;  Laterality: N/A;   CHOLECYSTECTOMY  1992   COLONOSCOPY N/A 09/03/2014   SLF:six colon polyps removed/small internal hemorrhoids   CORONARY STENT INTERVENTION N/A 01/21/2017   Procedure: Coronary Stent Intervention;  Surgeon: Nelva Bush, MD;  Location: [place] CV LAB;  Service: Cardiovascular;  Laterality: N/A;   ESOPHAGOGASTRODUODENOSCOPY N/A 09/03/2014   SLF: mild gastritis/few gastric polyps   LEFT HEART CATH AND CORONARY ANGIOGRAPHY N/A 01/20/2017   Procedure: Left Heart Cath and Coronary Angiography;  Surgeon: Jettie Booze, MD;  Location: [place] CV LAB;  Service: Cardiovascular;  Laterality: N/A;   RIGHT HEART CATH N/A 06/19/2021   Procedure: RIGHT HEART CATH;  Surgeon: Larey Dresser, MD;  Location: [place] CV LAB;  Service: Cardiovascular;  Laterality: N/A;   TEE WITHOUT CARDIOVERSION N/A 06/20/2021   Procedure: TRANSESOPHAGEAL ECHOCARDIOGRAM (TEE);  Surgeon: Larey Dresser, MD;  [facility] ENDOSCOPY;  Service: Cardiovascular;  Laterality: N/A;   TOTAL KNEE ARTHROPLASTY  11/10/2011   Procedure: TOTAL KNEE ARTHROPLASTY;  Surgeon: Mauri Pole, MD;  Location: [facility];  Service: Orthopedics;  Laterality: Right;   TOTAL KNEE ARTHROPLASTY Left 05/24/2018   Procedure: LEFT TOTAL KNEE ARTHROPLASTY;  Surgeon: Melrose Nakayama, MD;  Location: [place];  Service: Orthopedics;  Laterality: Left;    Family History: Family History  Problem Relation Age of Onset   Cancer Mother        breast and lung   Cancer Father        bladder   Cancer Maternal Uncle        prostate   Cancer Paternal Uncle        esophagus   Colon cancer Neg Hx     Social History: Social History   Socioeconomic History   Marital status: Widowed    Spouse name: Not on file   Number of children: 1   Years of education: Not on file   Highest education level: Some college, no degree  Occupational History   Occupation: retired    Comment: truck Geophysicist/field seismologist  Tobacco Use   Smoking status: Former    Packs/day: 1.00    Years: 30.00    Pack years: 30.00    Types: Cigarettes    Quit date: 10/2019    Years since quitting: 1.8   Smokeless tobacco: Never  Vaping Use   Vaping Use: Never used  Substance and Sexual Activity   Alcohol use: No    Alcohol/week: 0.0 standard drinks   Drug use: No   Sexual activity: Yes  Other Topics Concern   Not on file  Social History Narrative   Not on file   Social Determinants of Health   Financial Resource Strain: Low Risk    Difficulty of Paying Living Expenses: Not very hard  Food Insecurity: No Food Insecurity   Worried About [place] in the  Last Year: Never true   Ran Out of Food in the Last Year: Never true  Transportation Needs: No  Transportation Needs   Lack of Transportation (Medical): No   Lack of Transportation (Non-Medical): No  Physical Activity: Not on file  Stress: Not on file  Social Connections: Not on file    Allergies:  No Known Allergies  Objective:    Vital Signs:   Temp:  [97.6 F (36.4 C)-98.2 F (36.8 C)] 98.2 F (36.8 C) (01/13 0428) Pulse Rate:  [33-97] 47 (01/13 0428) Resp:  [0-28] 17 (01/12 1830) BP: (99-145)/(51-88) 102/55 (01/13 0428) SpO2:  [79 %-100 %] 97 % (01/13 0831) Weight:  [136 kg-141 kg] 141 kg (01/13 0428) Last BM Date: 08/13/21  Weight change: Filed Weights   08/14/21 1029 08/14/21 2052 08/15/21 0428  Weight: 136.1 kg 136 kg (!) 141 kg    Intake/Output:   Intake/Output Summary (Last 24 hours) at 08/15/2021 1025 Last data filed at 08/15/2021 0714 Gross per 24 hour  Intake 123 ml  Output 1050 ml  Net -927 ml      Physical Exam    General:  Sitting on the side of the bed.  No resp difficulty HEENT: normal Neck: supple. JVP difficult to assess due to body habitus.  Carotids 2+ bilat; no bruits. No lymphadenopathy or thyromegaly appreciated. Cor: PMI nondisplaced. Regular rate & rhythm. No rubs, gallops or murmurs. Lungs: clear Abdomen: obese, soft, nontender, nondistended. No hepatosplenomegaly. No bruits or masses. Good bowel sounds. Extremities: no cyanosis, clubbing, rash, R and LLE 3+ edema Neuro: alert & orientedx3, cranial nerves grossly intact. moves all 4 extremities w/o difficulty. Affect pleasant   Telemetry   Sinus Brady 40-50s   EKG   Bradycardic. Junctional Rhythm. ? Heart Block   Labs   Basic Metabolic Panel: Recent Labs  Lab 08/14/21 1040 08/15/21 0332  NA 141 141  K 3.1* 3.1*  CL 105 107  CO2 25 22  GLUCOSE 115* 60*  BUN 68* 62*  CREATININE 4.06* 3.97*  CALCIUM 8.9 8.5*  MG 2.3 2.2    Liver Function Tests: No results for input(s): AST, ALT, ALKPHOS, BILITOT, PROT, ALBUMIN in the last 168 hours. No results for input(s):  LIPASE, AMYLASE in the last 168 hours. No results for input(s): AMMONIA in the last 168 hours.  CBC: Recent Labs  Lab 08/14/21 1040 08/15/21 0332  WBC 5.3 5.0  NEUTROABS 3.4  --   HGB 12.8* 11.4*  HCT 39.1 35.5*  MCV 88.5 88.3  PLT 148* 138*    Cardiac Enzymes: No results for input(s): CKTOTAL, CKMB, CKMBINDEX, TROPONINI in the last 168 hours.  BNP: BNP (last 3 results) Recent Labs    06/11/21 1338 07/15/21 1531 08/14/21 1040  BNP 199.1* 100.1* 342.0*    ProBNP (last 3 results) No results for input(s): PROBNP in the last 8760 hours.   CBG: No results for input(s): GLUCAP in the last 168 hours.  Coagulation Studies: No results for input(s): LABPROT, INR in the last 72 hours.   Imaging   DG Chest Port 1 View  Result Date: 08/14/2021 CLINICAL DATA:  Chest pain, shortness of breath. EXAM: PORTABLE CHEST 1 VIEW COMPARISON:  June 20, 2021. FINDINGS: Stable cardiomediastinal silhouette. Mild central pulmonary vascular congestion is noted with possible minimal bilateral pulmonary edema. Bony thorax is unremarkable. IMPRESSION: Stable cardiomegaly with mild central pulmonary vascular congestion and possible bilateral pulmonary edema. Electronically Signed   By: Marijo Conception M.D.   On: 08/14/2021 11:49  ECHOCARDIOGRAM COMPLETE  Result Date: 08/14/2021    ECHOCARDIOGRAM REPORT   Patient Name:   [name] [name] Date of Exam: 08/14/2021 Medical Rec #:  [id number]     Height:       73.0 in Accession #:    [id number]    Weight:       300.0 lb Date of Birth:  1945/10/26     BSA:          2.556 m Patient Age:    58 years      BP:           135/58 mmHg Patient Gender: M             HR:           83 bpm. Exam Location:  [facility] Procedure: 2D Echo, Cardiac Doppler and Color Doppler Indications:    CHF-Acute Diastolic I34.74  History:        Patient has prior history of Echocardiogram examinations, most                 recent 06/20/2021. CHF, CAD and Previous Myocardial Infarction,                  COPD, Arrythmias:Atrial Fibrillation and LBBB; Risk                 Factors:Diabetes, Hypertension, Former Smoker and Dyslipidemia.                 Hx of Pneumonia due to COVID-19 virus, Non Hodgkin's lymphoma                 ([place]) (From Hx).  Sonographer:    Alvino Chapel RCS Referring Phys: [id number] [place]  1. Left ventricular ejection fraction, by estimation, is 45 to 50%. The left ventricle has mildly decreased function. The left ventricle demonstrates global hypokinesis. The left ventricular internal cavity size was mildly dilated. There is mild left ventricular hypertrophy. Left ventricular diastolic parameters are consistent with Grade III diastolic dysfunction (restrictive).  2. Right ventricular systolic function is mildly to moderately reduced. The right ventricular size is mildly enlarged. There is moderately elevated pulmonary artery systolic pressure. The estimated right ventricular systolic pressure is 75.6 mmHg.  3. Left atrial size was moderately dilated.  4. Right atrial size was moderately dilated.  5. Tricuspid valve regurgitation is mild to moderate.  6. The aortic valve is tricuspid. There is mild calcification of the aortic valve. Aortic valve regurgitation is not visualized. Aortic valve mean gradient measures 9.0 mmHg.  7. The inferior vena cava is dilated in size with >50% respiratory variability, suggesting right atrial pressure of 8 mmHg.  8. The mitral valve is grossly normal. Trivial mitral valve regurgitation. Comparison(s): No significant change from prior study. Prior images reviewed side by side. FINDINGS  Left Ventricle: Left ventricular ejection fraction, by estimation, is 45 to 50%. The left ventricle has mildly decreased function. The left ventricle demonstrates global hypokinesis. Definity contrast agent was given IV to delineate the left ventricular  endocardial borders. The left ventricular internal cavity size was mildly dilated. There is mild  left ventricular hypertrophy. Left ventricular diastolic parameters are consistent with Grade III diastolic dysfunction (restrictive). Right Ventricle: The right ventricular size is mildly enlarged. No increase in right ventricular wall thickness. Right ventricular systolic function is mildly reduced. There is moderately elevated pulmonary artery systolic pressure. The tricuspid regurgitant velocity is 3.41 m/s, and with an assumed right atrial pressure  of 8 mmHg, the estimated right ventricular systolic pressure is 32.4 mmHg. Left Atrium: Left atrial size was moderately dilated. Right Atrium: Right atrial size was moderately dilated. Pericardium: There is no evidence of pericardial effusion. Mitral Valve: The mitral valve is grossly normal. Trivial mitral valve regurgitation. Tricuspid Valve: The tricuspid valve is grossly normal. Tricuspid valve regurgitation is mild to moderate. Aortic Valve: The aortic valve is tricuspid. There is mild calcification of the aortic valve. There is mild aortic valve annular calcification. Aortic valve regurgitation is not visualized. Aortic valve mean gradient measures 9.0 mmHg. Aortic valve peak gradient measures 14.7 mmHg. Aortic valve area, by VTI measures 1.95 cm. Pulmonic Valve: The pulmonic valve was grossly normal. Pulmonic valve regurgitation is mild. Aorta: The aortic root is normal in size and structure. Venous: The inferior vena cava is dilated in size with greater than 50% respiratory variability, suggesting right atrial pressure of 8 mmHg. IAS/Shunts: No atrial level shunt detected by color flow Doppler.  LEFT VENTRICLE PLAX 2D LVIDd:         6.20 cm LVIDs:         4.80 cm LV PW:         1.00 cm LV IVS:        1.10 cm LVOT diam:     2.00 cm LV SV:         108 LV SV Index:   42 LVOT Area:     3.14 cm  RIGHT VENTRICLE TAPSE (M-mode): 2.4 cm LEFT ATRIUM              Index        RIGHT ATRIUM           Index LA diam:        4.70 cm  1.84 cm/m   RA Area:     31.00 cm LA  Vol (A2C):   145.0 ml 56.72 ml/m  RA Volume:   123.00 ml 48.12 ml/m LA Vol (A4C):   116.0 ml 45.38 ml/m LA Biplane Vol: 132.0 ml 51.64 ml/m  AORTIC VALVE AV Area (Vmax):    2.13 cm AV Area (Vmean):   2.03 cm AV Area (VTI):     1.95 cm AV Vmax:           192.00 cm/s AV Vmean:          142.000 cm/s AV VTI:            0.555 m AV Peak Grad:      14.7 mmHg AV Mean Grad:      9.0 mmHg LVOT Vmax:         130.00 cm/s LVOT Vmean:        91.800 cm/s LVOT VTI:          0.344 m LVOT/AV VTI ratio: 0.62  AORTA Ao Root diam: 3.80 cm MITRAL VALVE                TRICUSPID VALVE MV Area (PHT): 4.39 cm     TR Peak grad:   46.5 mmHg MV Decel Time: 173 msec     TR Vmax:        341.00 cm/s MV E velocity: 127.00 cm/s MV A velocity: 58.00 cm/s   SHUNTS MV E/A ratio:  2.19         Systemic VTI:  0.34 m  Systemic Diam: 2.00 cm Rozann Lesches MD Electronically signed by Rozann Lesches MD Signature Date/Time: 08/14/2021/5:00:25 PM    Final      Medications:     Current Medications:  apixaban  5 mg Oral BID   furosemide  40 mg Intravenous Q12H   insulin glargine-yfgn  25 Units Subcutaneous QHS   levothyroxine  300 mcg Oral QAC breakfast   loratadine  10 mg Oral Daily   mometasone-formoterol  2 puff Inhalation BID   simvastatin  5 mg Oral Daily   sodium chloride flush  3 mL Intravenous Q12H    Infusions:  sodium chloride        Assessment/Plan   A/C HFpEF  Echo EF 45-50% RV moderately  - Admitted with marked Given another dose of 40 mg IV lasix. Add lasix drip at 12 mg per hour. - Add milrinone 0.25 mcg to support RV.  - No PICC with CKD Stage IV - Add UNNA boots.   2. Bradycardia ,H/O PAF  - ? Heart block.  -Stop amio.  -Bisoprolol was stopped on admit.   3.CKD Stage IV Creatinine baseline Creatinine baseline 3   4. Suspected OSA Needs formal sleep study.   5. CAD  S/p NSTEMI with occluded OM1 in 6/18, had DES to 80% stenosis mLAD at that time  Length of Stay:  1  Amy Clegg, NP  08/15/2021, 10:25 AM  Advanced Heart Failure Team Pager [phone] (M-F; 7a - 5p)  Please contact [facility] for night-coverage after hours (4p -7a ) and weekends on [email or web address]  Patient seen with NP, agree with the above note.    History as outlined above.  Since last admission, he has gained about 25 lbs.  He has been admitted with acute on chronic HFmREF with prominent RV failure.  Initially with HR in 40s, ?atrial fibrillation with junctional escape.  ECG this morning shows NSR with long 1st degree AVB and IVCD. Bisoprolol and amiodarone held.  Creatinine is 3.97, prior baseline around 3.   General: NAD Neck: JVP 14-16 cm, no thyromegaly or thyroid nodule.  Lungs: Clear to auscultation bilaterally with normal respiratory effort. CV: Nondisplaced PMI.  Heart brady, regular S1/S2, no S3/S4, 1/6 HSM LLSB.  2+ edema to thighs.  No carotid bruit.  Difficult to palpate pedal pulses.  Abdomen: Soft, nontender, no hepatosplenomegaly, no distention.  Skin: Intact without lesions or rashes.  Neurologic: Alert and oriented x 3.  Psych: Normal affect. Extremities: No clubbing or cyanosis.  HEENT: Normal.   1. Acute on chronic HF with mid range EF: Also with prominent RV failure.  In setting of CKD stage IV.  RHC 11/22 RA mean, PCWP mean 18, moderate to severe PAH with PVR 4 WU, CI low at 1.94. Echo this admission with EF 45-50%, mild LV dilation, mild-moderately decreased RV function with mild RVE, PASP 55.  Weight is up about 25 lbs from prior discharge.  He was admitted with worsening dyspnea and edema.  On exam, he is significantly volume overloaded.  Creatinine up to 3.97 from baseline around 3.  - Given low output on recent RHC, I will start him on milrinone 0.25 mcg/kg/min for RV support (should also lower PA pressure).   - Lasix 80 mg IV x 1 then 12 mg/hr.   2. Pulmonary hypertension:  Moderate to severe PAH with PVR 4 WU on RHC 11/22. 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 No  chronic PE on recent V/Q scan.  - Not candidate for pulmonary vasodilators.  3. Atrial fibrillation: DC-CV in 11/22, started on amiodarone.  He was admitted bradycardic in 40s, concern for underlying junctional rhythm/heart block with regularized afib but baseline poor.  Currently, NSR with long 1st degree AVB in 24s.  - Bisoprolol stopped with bradycardia, leave off.  - Discussed with EP, especially with starting milrinone, will restart amiodarone 200 mg daily.  - Continue apixaban 5 mg bid.  4. CAD: S/p NSTEMI with occluded OM1 in 6/18, had DES to 80% stenosis mLAD at that time.  No chest pain. No coronary angiography in absence of ACS given CKD stage 4.  - Continue statin.  - No ASA given Eliquis use.  5. AKI on CKD: Stage IV. Baseline creatinine 3.  Creatinine 3.97 today (stable).  Volume overloaded, will be diuresing with milrinone added for RV support.  6. Type 2 DM: Per primary service.   7. Chronic hypoxemic respiratory failure: He is on 2L home oxygen.  Sleep study with only mild OSA in 12/22 but with oxygen desaturation.  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  He smoked in the past but not heavily.  - Would get PFTs after diuresis   [name] 08/15/2021 12:25 PM

## 2021-08-15 NOTE — Progress Notes (Signed)
Inpatient Diabetes Program Recommendations  AACE/ADA: New Consensus Statement on Inpatient Glycemic Control (2015)  Target Ranges:  Prepandial:   less than 140 mg/dL      Peak postprandial:   less than 180 mg/dL (1-2 hours)      Critically ill patients:  140 - 180 mg/dL   Lab Results  Component Value Date   GLUCAP 95 06/26/2021   HGBA1C 5.8 (H) 01/21/2021    Review of Glycemic Control  Latest Reference Range & Units 08/15/21 03:32  Glucose 70 - 99 mg/dL 60 (L)   Diabetes history: DM 2 Outpatient Diabetes medications: Tresiba 25 units Current orders for Inpatient glycemic control:  Semglee 25 units   Hypoglycemia 60 in labs this am  Inpatient Diabetes Program Recommendations:    -  Order CBGs with our glucometer per policy -  Novolog 0-6 units tid + hs -  Reduce Semglee to 18 units  Thanks,  Tama Headings RN, MSN, BC-ADM Inpatient Diabetes Coordinator Team Pager [phone] (8a-5p)

## 2021-08-15 NOTE — Progress Notes (Signed)
Mobility Specialist: Progress Note   08/15/21 1700  Mobility  Activity Refused mobility   Pt refused mobility d/t being cold. Pt said he wants to walk but doesn't feel up to it right now. Will f/u as able.   [facility] Nicolo Tomko Mobility Specialist Mobility Specialist 4 [place]: [phone] Mobility Specialist 2 [place] and [place]: [phone]

## 2021-08-15 NOTE — Consult Note (Addendum)
ELECTROPHYSIOLOGY CONSULT NOTE    Patient ID: OLA FAWVER MRN: [id number], DOB/AGE: [birth year] 76 y.o.  Admit date: 08/14/2021 Date of Consult: 08/15/2021  Primary Physician: Nickola Major, MD Primary Cardiologist: Rozann Lesches, MD  Electrophysiologist: New  Referring Provider: Dr. Aundra Dubin  Patient Profile: [name] [name] is a 76 y.o. male with a history of chronic diastolic CHF, chronic hypoxemic respiratory failure/COPD, CKD stage 4, and persistent atrial fibrillation who is being seen today for the evaluation of bradycardia at the request of Dr. Aundra Dubin.  HPI:  [name] [name] is a 76 y.o. male with medical history as above.  Most recently admitted on 11/9 with weight gain and marked edema as well as hypoxemia.  He was treated with prednisone for 5 days for ?component of COPDE.  Hospital course c/b A fib and AKI. He was started on Lasix 80 mg IV bid. RHC showed mildly elevated right and left heart filling pressures, mod-severe PAH w/ PVR 4 WU and low cardiac index, 1.94. He was diuresed further. Creatinine plateaued ~3.0. Underwent TEE//DC-CV with restoration of NSR. EF on TEE 45-50%, RV mildly reduced.  He was discharged home on torsemide 60 mg bid. Amio 400 mg daily x 2 weeks then 200 mg daily (outpatient PFTs recommended) + Eliquis for a/c. D/c wt 274 lb.    He was seen in the HF clinic 07/2021. Torsemide had been cut back to 40/20 by Nephrology.  Weight at that time was 290 pound.   Pt presented to [facility] 08/14/2021 with progressive SOB and edema. CXR with vascular congestion. BNP 342. CKD IV with AKI at Cr ~4.0 (Baseline ~ 3.0)  EKG on admission shows junctional rhythm vs sinus bradycardia (difficult to appreciate p waves on EKG) Tele appears to show sinus brady as low as the 40s overnight.   HF team plan is to add milrinone for RV support for diuresis. No PICC line with CKD IV. Add UNNA boots and monitor Cr carefully.   Bisoprolol stopped on admit. Holding amio now. EP  asked to see for rhythm clarification and guidance.   Currently patient is feeling OK at rest. Legs very edematous. Repeats history above of recent worsening SOB. Says "My heart is always slow". No syncope.   Past Medical History:  Diagnosis Date   Atrial fibrillation ([place])    CAD (coronary artery disease)    DES to LAD June 2018   CKD (chronic kidney disease) stage 3, GFR 30-59 ml/min (HCC)    Essential hypertension    History of pneumonia    Hyperlipidemia    Hypothyroidism    LBBB (left bundle branch block)    Morbid obesity ([place])    Non Hodgkin's lymphoma ([place])    Status post XRT and chemotherapy   NSTEMI (non-ST elevated myocardial infarction) [facility])    June 2018   Peripheral neuropathy    Pneumonia due to COVID-19 virus    Sleep apnea    Type 2 diabetes mellitus ([place])      Surgical History:  Past Surgical History:  Procedure Laterality Date   CARDIOVERSION N/A 06/20/2021   Procedure: CARDIOVERSION;  Surgeon: Larey Dresser, MD;  Location: [place];  Service: Cardiovascular;  Laterality: N/A;   CHOLECYSTECTOMY  1992   COLONOSCOPY N/A 09/03/2014   SLF:six colon polyps removed/small internal hemorrhoids   CORONARY STENT INTERVENTION N/A 01/21/2017   Procedure: Coronary Stent Intervention;  Surgeon: Nelva Bush, MD;  Location: [place] CV [facility];  Service: Cardiovascular;  Laterality: N/A;  ESOPHAGOGASTRODUODENOSCOPY N/A 09/03/2014   SLF: mild gastritis/few gastric polyps   LEFT HEART CATH AND CORONARY ANGIOGRAPHY N/A 01/20/2017   Procedure: Left Heart Cath and Coronary Angiography;  Surgeon: Jettie Booze, MD;  Location: [place] [facility];  Service: Cardiovascular;  Laterality: N/A;   RIGHT HEART CATH N/A 06/19/2021   Procedure: RIGHT HEART CATH;  Surgeon: Larey Dresser, MD;  Location: [place] [facility];  Service: Cardiovascular;  Laterality: N/A;   TEE WITHOUT CARDIOVERSION N/A 06/20/2021   Procedure: TRANSESOPHAGEAL ECHOCARDIOGRAM (TEE);  Surgeon:  Larey Dresser, MD;  Location: [facility] ENDOSCOPY;  Service: Cardiovascular;  Laterality: N/A;   TOTAL KNEE ARTHROPLASTY  11/10/2011   Procedure: TOTAL KNEE ARTHROPLASTY;  Surgeon: Mauri Pole, MD;  Location: [facility];  Service: Orthopedics;  Laterality: Right;   TOTAL KNEE ARTHROPLASTY Left 05/24/2018   Procedure: LEFT TOTAL KNEE ARTHROPLASTY;  Surgeon: Melrose Nakayama, MD;  Location: [place];  Service: Orthopedics;  Laterality: Left;     Medications Prior to Admission  Medication Sig Dispense Refill Last Dose   acetaminophen (TYLENOL) 325 MG tablet Take 2 tablets (650 mg total) by mouth every 6 (six) hours as needed for mild pain (or Fever >/= 101). 30 tablet 1 UNK   amiodarone (PACERONE) 200 MG tablet Take 200 mg by mouth daily.   08/14/2021   amLODipine (NORVASC) 10 MG tablet Take 10 mg by mouth daily.   08/14/2021   apixaban (ELIQUIS) 5 MG TABS tablet Take 1 tablet (5 mg total) by mouth 2 (two) times daily. This is a dose change 60 tablet 6 08/14/2021 at 0830   bisoprolol (ZEBETA) 5 MG tablet Take 5 mg by mouth daily.   08/14/2021   fluticasone (FLONASE) 50 MCG/ACT nasal spray Place 2 sprays into both nostrils daily as needed for allergies.   UNK   insulin degludec (TRESIBA FLEXTOUCH) 200 UNIT/ML FlexTouch Pen Inject 25 Units into the skin daily.   08/13/2021   ipratropium-albuterol (DUONEB) 0.5-2.5 (3) MG/3ML SOLN Inhale 3 mLs into the lungs every 4 (four) hours as needed (shortness of breath).   Past Week   isosorbide mononitrate (IMDUR) 30 MG 24 hr tablet Take 1 tablet (30 mg total) by mouth daily. 30 tablet 6 08/14/2021   levocetirizine (XYZAL) 5 MG tablet Take 5 mg by mouth daily.   08/14/2021   levothyroxine (SYNTHROID) 100 MCG tablet Take 100 mcg by mouth daily before breakfast. (Takes with 200 mcg tab for a total of 300 mcg once daily)   08/14/2021   levothyroxine (SYNTHROID, LEVOTHROID) 200 MCG tablet Take 300 mcg by mouth daily before breakfast. (takes with 154mcg tab for a total of 331mcg)    08/14/2021   nitroGLYCERIN (NITROSTAT) 0.4 MG SL tablet Place 0.4 mg under the tongue every 5 (five) minutes as needed for chest pain.   11 UNK   OXYGEN Inhale 3-4 L into the lungs continuous.   Continuous   senna-docusate (SENOKOT-S) 8.6-50 MG tablet Take 1 tablet by mouth daily as needed for mild constipation.   UNK   simvastatin (ZOCOR) 5 MG tablet Take 5 mg by mouth daily.   08/14/2021   sodium chloride (OCEAN) 0.65 % SOLN nasal spray Place 1 spray into both nostrils as needed for congestion.   Past Month   SYMBICORT 160-4.5 MCG/ACT inhaler Inhale 2 puffs into the lungs daily as needed (shortness of breath).   08/13/2021   torsemide (DEMADEX) 20 MG tablet Take 2 tablets (40 mg total) by mouth 2 (two) times  daily. 120 tablet 6 08/14/2021   potassium chloride SA (KLOR-CON) 10 MEQ tablet Take 1 tablet (10 mEq total) by mouth daily. (Patient not taking: Reported on 08/14/2021) 30 tablet 0 Not Taking    Inpatient Medications:   apixaban  5 mg Oral BID   furosemide  80 mg Intravenous Q12H   insulin glargine-yfgn  25 Units Subcutaneous QHS   levothyroxine  300 mcg Oral QAC breakfast   loratadine  10 mg Oral Daily   mometasone-formoterol  2 puff Inhalation BID   simvastatin  5 mg Oral Daily   sodium chloride flush  3 mL Intravenous Q12H    Allergies: No Known Allergies  Social History   Socioeconomic History   Marital status: Widowed    Spouse name: Not on file   Number of children: 1   Years of education: Not on file   Highest education level: Some college, no degree  Occupational History   Occupation: retired    Comment: truck Geophysicist/field seismologist  Tobacco Use   Smoking status: Former    Packs/day: 1.00    Years: 30.00    Pack years: 30.00    Types: Cigarettes    Quit date: 10/2019    Years since quitting: 1.8   Smokeless tobacco: Never  Vaping Use   Vaping Use: Never used  Substance and Sexual Activity   Alcohol use: No    Alcohol/week: 0.0 standard drinks   Drug use: No   Sexual  activity: Yes  Other Topics Concern   Not on file  Social History Narrative   Not on file   Social Determinants of Health   Financial Resource Strain: Low Risk    Difficulty of Paying Living Expenses: Not very hard  Food Insecurity: No Food Insecurity   Worried About Charity fundraiser in the Last Year: Never true   [place] in the Last Year: Never true  Transportation Needs: No Transportation Needs   Lack of Transportation (Medical): No   Lack of Transportation (Non-Medical): No  Physical Activity: Not on file  Stress: Not on file  Social Connections: Not on file  Intimate Partner Violence: Not At Risk   Fear of Current or Ex-Partner: No   Emotionally Abused: No   Physically Abused: No   Sexually Abused: No     Family History  Problem Relation Age of Onset   Cancer Mother        breast and lung   Cancer Father        bladder   Cancer Maternal Uncle        prostate   Cancer Paternal Uncle        esophagus   Colon cancer Neg Hx      Review of Systems: All other systems reviewed and are otherwise negative except as noted above.  Physical Exam: Vitals:   08/14/21 2034 08/14/21 2052 08/15/21 0428 08/15/21 0831  BP: 118/60  (!) 102/55   Pulse: (!) 54  (!) 47   Resp:      Temp: 97.7 F (36.5 C)  98.2 F (36.8 C)   TempSrc: Oral  Oral   SpO2: 90%  93% 97%  Weight:  136 kg (!) 141 kg   Height:  6\' 1"  (1.854 m)      GEN- The patient is well appearing, alert and oriented x 3 today.   HEENT: normocephalic, atraumatic; sclera clear, conjunctiva pink; hearing intact; oropharynx clear; neck supple Lungs- Clear to ausculation bilaterally, normal work of  breathing.  No wheezes, rales, rhonchi Heart- Regular rate and rhythm, no murmurs, rubs or gallops GI- soft, non-tender, non-distended, bowel sounds present Extremities- no clubbing, cyanosis, or edema; DP/PT/radial pulses 2+ bilaterally MS- no significant deformity or atrophy Skin- warm and dry, no rash or  lesion Psych- euthymic mood, full affect Neuro- strength and sensation are intact  Labs:   Lab Results  Component Value Date   WBC 5.0 08/15/2021   HGB 11.4 (L) 08/15/2021   HCT 35.5 (L) 08/15/2021   MCV 88.3 08/15/2021   PLT 138 (L) 08/15/2021    Recent Labs  Lab 08/15/21 0332  NA 141  K 3.1*  CL 107  CO2 22  BUN 62*  CREATININE 3.97*  CALCIUM 8.5*  GLUCOSE 60*      Radiology/Studies: DG Chest Port 1 View  Result Date: 08/14/2021 CLINICAL DATA:  Chest pain, shortness of breath. EXAM: PORTABLE CHEST 1 VIEW COMPARISON:  June 20, 2021. FINDINGS: Stable cardiomediastinal silhouette. Mild central pulmonary vascular congestion is noted with possible minimal bilateral pulmonary edema. Bony thorax is unremarkable. IMPRESSION: Stable cardiomegaly with mild central pulmonary vascular congestion and possible bilateral pulmonary edema. Electronically Signed   By: Marijo Conception M.D.   On: 08/14/2021 11:49   ECHOCARDIOGRAM COMPLETE  Result Date: 08/14/2021    ECHOCARDIOGRAM REPORT   Patient Name:   HELIODORO DOMAGALSKI Date of Exam: 08/14/2021 Medical Rec #:  [id number]     Height:       73.0 in Accession #:    [id number]    Weight:       300.0 lb Date of Birth:  [birth year]     BSA:          2.556 m Patient Age:    23 years      BP:           135/58 mmHg Patient Gender: M             HR:           83 bpm. Exam Location:  [facility] Procedure: 2D Echo, Cardiac Doppler and Color Doppler Indications:    CHF-Acute Diastolic G28.36  History:        Patient has prior history of Echocardiogram examinations, most                 recent 06/20/2021. CHF, CAD and Previous Myocardial Infarction,                 COPD, Arrythmias:Atrial Fibrillation and LBBB; Risk                 Factors:Diabetes, Hypertension, Former Smoker and Dyslipidemia.                 Hx of Pneumonia due to COVID-19 virus, Non Hodgkin's lymphoma                 ([place]) (From Hx).  Sonographer:    Alvino Chapel RCS Referring Phys: [id number]  [place]  1. Left ventricular ejection fraction, by estimation, is 45 to 50%. The left ventricle has mildly decreased function. The left ventricle demonstrates global hypokinesis. The left ventricular internal cavity size was mildly dilated. There is mild left ventricular hypertrophy. Left ventricular diastolic parameters are consistent with Grade III diastolic dysfunction (restrictive).  2. Right ventricular systolic function is mildly to moderately reduced. The right ventricular size is mildly enlarged. There is moderately elevated pulmonary artery systolic pressure. The estimated right ventricular systolic pressure is  54.5 mmHg.  3. Left atrial size was moderately dilated.  4. Right atrial size was moderately dilated.  5. Tricuspid valve regurgitation is mild to moderate.  6. The aortic valve is tricuspid. There is mild calcification of the aortic valve. Aortic valve regurgitation is not visualized. Aortic valve mean gradient measures 9.0 mmHg.  7. The inferior vena cava is dilated in size with >50% respiratory variability, suggesting right atrial pressure of 8 mmHg.  8. The mitral valve is grossly normal. Trivial mitral valve regurgitation. Comparison(s): No significant change from prior study. Prior images reviewed side by side. FINDINGS  Left Ventricle: Left ventricular ejection fraction, by estimation, is 45 to 50%. The left ventricle has mildly decreased function. The left ventricle demonstrates global hypokinesis. Definity contrast agent was given IV to delineate the left ventricular  endocardial borders. The left ventricular internal cavity size was mildly dilated. There is mild left ventricular hypertrophy. Left ventricular diastolic parameters are consistent with Grade III diastolic dysfunction (restrictive). Right Ventricle: The right ventricular size is mildly enlarged. No increase in right ventricular wall thickness. Right ventricular systolic function is mildly reduced. There is  moderately elevated pulmonary artery systolic pressure. The tricuspid regurgitant velocity is 3.41 m/s, and with an assumed right atrial pressure of 8 mmHg, the estimated right ventricular systolic pressure is 69.6 mmHg. Left Atrium: Left atrial size was moderately dilated. Right Atrium: Right atrial size was moderately dilated. Pericardium: There is no evidence of pericardial effusion. Mitral Valve: The mitral valve is grossly normal. Trivial mitral valve regurgitation. Tricuspid Valve: The tricuspid valve is grossly normal. Tricuspid valve regurgitation is mild to moderate. Aortic Valve: The aortic valve is tricuspid. There is mild calcification of the aortic valve. There is mild aortic valve annular calcification. Aortic valve regurgitation is not visualized. Aortic valve mean gradient measures 9.0 mmHg. Aortic valve peak gradient measures 14.7 mmHg. Aortic valve area, by VTI measures 1.95 cm. Pulmonic Valve: The pulmonic valve was grossly normal. Pulmonic valve regurgitation is mild. Aorta: The aortic root is normal in size and structure. Venous: The inferior vena cava is dilated in size with greater than 50% respiratory variability, suggesting right atrial pressure of 8 mmHg. IAS/Shunts: No atrial level shunt detected by color flow Doppler.  LEFT VENTRICLE PLAX 2D LVIDd:         6.20 cm LVIDs:         4.80 cm LV PW:         1.00 cm LV IVS:        1.10 cm LVOT diam:     2.00 cm LV SV:         108 LV SV Index:   42 LVOT Area:     3.14 cm  RIGHT VENTRICLE TAPSE (M-mode): 2.4 cm LEFT ATRIUM              Index        RIGHT ATRIUM           Index LA diam:        4.70 cm  1.84 cm/m   RA Area:     31.00 cm LA Vol (A2C):   145.0 ml 56.72 ml/m  RA Volume:   123.00 ml 48.12 ml/m LA Vol (A4C):   116.0 ml 45.38 ml/m LA Biplane Vol: 132.0 ml 51.64 ml/m  AORTIC VALVE AV Area (Vmax):    2.13 cm AV Area (Vmean):   2.03 cm AV Area (VTI):     1.95 cm AV Vmax:  192.00 cm/s AV Vmean:          142.000 cm/s AV  VTI:            0.555 m AV Peak Grad:      14.7 mmHg AV Mean Grad:      9.0 mmHg LVOT Vmax:         130.00 cm/s LVOT Vmean:        91.800 cm/s LVOT VTI:          0.344 m LVOT/AV VTI ratio: 0.62  AORTA Ao Root diam: 3.80 cm MITRAL VALVE                TRICUSPID VALVE MV Area (PHT): 4.39 cm     TR Peak grad:   46.5 mmHg MV Decel Time: 173 msec     TR Vmax:        341.00 cm/s MV E velocity: 127.00 cm/s MV A velocity: 58.00 cm/s   SHUNTS MV E/A ratio:  2.19         Systemic VTI:  0.34 m                             Systemic Diam: 2.00 cm Rozann Lesches MD Electronically signed by Rozann Lesches MD Signature Date/Time: 08/14/2021/5:00:25 PM    Final     EKG: on admission shows bradycardia at 48 bpm with wide QRS. Difficult to appreciate if sinus or junctional due to noisy baseline (personally reviewed)  TELEMETRY: Appears sinus brady with long 1st degree AV block  and occasional PVCs(personally reviewed)   Assessment/Plan: Sinus  Bradycardia  Bifascicular block at baseline with PR interval > 300 and LBBB Agree with holding bisoprolol.  OK to continue amiodarone, especially with plans to start milrinone By tele appears more likely sinus brady with prolonged PR interval vs junctional. No urgent indication for pacing.   2. Paroxysmal atrial fibrillation Has been symptomatic in the past Most recent [facility] 06/20/2021 Amiodarone and bisoprolol on hold  3. Acute on chronic heart failure with mildly reduced ejection fraction RV>LV failure with echo this admission showing LVEF 45-50% and mild/mod reduced RV Grade III diastolic dysfunction Markedly volume overloaded HF team starting milrinone to support RV for diuresis.   4. CKD IV Baseline Cr ~3.0 Up to 4 this admit. Aza Dantes be following closely with diuresis.    For questions or updates, please contact Joice Please consult [email or web address] for contact info under Cardiology/STEMI.  Signed, Shirley Friar, PA-C  08/15/2021 11:04  AM   I have seen and examined this patient with Oda Kilts.  Agree with above, note added to reflect my findings.  Patient admitted to the hospital with weight gain and edema.  He was found to be bradycardic.  He currently takes bisoprolol and amiodarone.  His creatinine was also elevated.  He does have a history of heart failure and atrial fibrillation.    GEN: Well nourished, well developed, in no acute distress  HEENT: normal  Neck: no JVD, carotid bruits, or masses Cardiac: Bradycardic; no murmurs, rubs, or gallops,no edema  Respiratory:  clear to auscultation bilaterally, normal work of breathing GI: soft, nontender, nondistended, + BS MS: no deformity or atrophy  Skin: warm and dry Neuro:  Strength and sensation are intact Psych: euthymic mood, full affect   Junctional bradycardia: Patient had previously been on bisoprolol and amiodarone.  Fortunately with holding bisoprolol on amiodarone, conduction returned.  Prior ECGs showed no  P waves, but he had now has P waves with a very prolonged PR interval.  Would avoid rate controlling medications.  Would be okay to continue his dose of amiodarone.  No pacemaker implant at this time.  If he does have further episodes of bradycardia, would consider pacemaker at that point. Persistent atrial fibrillation: Has been symptomatic in the past.  Currently on amiodarone.  Would continue for now.  If further episodes of bradycardia, may need to stop amiodarone.   Antolin M. Debbora Ang MD 08/15/2021 1:53 PM

## 2021-08-16 DIAGNOSIS — I5033 Acute on chronic diastolic (congestive) heart failure: Secondary | ICD-10-CM | POA: Diagnosis not present

## 2021-08-16 LAB — BASIC METABOLIC PANEL
Anion gap: 10 (ref 5–15)
BUN: 64 mg/dL — ABNORMAL HIGH (ref 8–23)
CO2: 22 mmol/L (ref 22–32)
Calcium: 8.3 mg/dL — ABNORMAL LOW (ref 8.9–10.3)
Chloride: 108 mmol/L (ref 98–111)
Creatinine, Ser: 4.16 mg/dL — ABNORMAL HIGH (ref 0.61–1.24)
GFR, Estimated: 14 mL/min — ABNORMAL LOW (ref >60)
Glucose, Bld: 88 mg/dL (ref 70–99)
Potassium: 3.3 mmol/L — ABNORMAL LOW (ref 3.5–5.1)
Sodium: 140 mmol/L (ref 135–145)

## 2021-08-16 LAB — GLUCOSE, CAPILLARY
Glucose-Capillary: 165 mg/dL — ABNORMAL HIGH (ref 70–99)
Glucose-Capillary: 100 mg/dL — ABNORMAL HIGH (ref 70–99)
Glucose-Capillary: 103 mg/dL — ABNORMAL HIGH (ref 70–99)
Glucose-Capillary: 85 mg/dL (ref 70–99)

## 2021-08-16 MED ORDER — MIDODRINE HCL 5 MG PO TABS
5.0000 mg | ORAL_TABLET | Freq: Three times a day (TID) | ORAL | Status: DC
  Administered 2021-08-16 – 2021-08-21 (×17): 5 mg via ORAL
  Filled 2021-08-16 (×17): qty 1

## 2021-08-16 MED ORDER — POTASSIUM CHLORIDE CRYS ER 20 MEQ PO TBCR
40.0000 meq | EXTENDED_RELEASE_TABLET | Freq: Two times a day (BID) | ORAL | Status: AC
  Administered 2021-08-16 (×2): 40 meq via ORAL
  Filled 2021-08-16 (×2): qty 2

## 2021-08-16 MED ORDER — METOLAZONE 5 MG PO TABS
2.5000 mg | ORAL_TABLET | Freq: Once | ORAL | Status: AC
  Administered 2021-08-16: 2.5 mg via ORAL
  Filled 2021-08-16: qty 1

## 2021-08-16 MED ORDER — MIDODRINE HCL 5 MG PO TABS: 5.0000 mg | ORAL_TABLET | Freq: Three times a day (TID) | ORAL | Status: DC

## 2021-08-16 MED ORDER — INSULIN GLARGINE-YFGN 100 UNIT/ML ~~LOC~~ SOLN
12.0000 [IU] | Freq: Every day | SUBCUTANEOUS | Status: DC
  Filled 2021-08-16 (×3): qty 0.12

## 2021-08-16 NOTE — Progress Notes (Signed)
PROGRESS NOTE    [name] PORTELL  [name]:[id number] DOB: [birth year] DOA: 08/14/2021 PCP: Nickola Major, MD   Chief Complaint  Patient presents with   Shortness of Breath   Leg Swelling    Brief Narrative:   : [name] [name] is a 76 y.o. male with medical history significant for atrial fibrillation status post DCCV 11/18, CAD s/p stenting, CKD stage IV, pulmonary hypertension, essential hypertension, morbid obesity, non-Hodgkin's lymphoma treated with chemotherapy and radiation treatment, diabetes mellitus type 2, OSA with chronic hypoxemic respiratory failure who presented to the ED with worsening abdominal and lower extremity edema.  He is also noticing some dyspnea on exertion and claims he has gained approximately 20 pounds since discharge on 11/22.  Assessment & Plan:   Principal Problem:   Acute on chronic diastolic (congestive) heart failure (HCC)   Acute on chronic diastolic CHF:  Diuresing with IV lasix gtt. DIURESED about 1.5 lit since admission.  Pt started on milrinone gtt.  Continue with strict intake and output, daily weights.  2 D echocardiogram reviewed showed LVEF is 45 to 50%. The left ventricle has mildly decreased function. The left ventricle demonstrates global hypokinesis. The left ventricular internal cavity size was mildly dilated. There is mild left ventricular hypertrophy. Left ventricular diastolic parameters are consistent with Grade III diastolic dysfunction (restrictive). Cardiology consulted and on board.    PAF;  Rate controlled.  S/p cardioversion,  On eliquis for anticoagulation.  Rate controlled with amiodarone.    Acute on Stage 4 CKD;  - possibly cardio renal.  Baseline creatinine around 2.7.  Creatinine slightly worsened to 4. 16. Will request nephrology input in am.    Hypokalemia Replaced. Recheck in am.    Hyperlipidemia:  Resume zocor.    Hypothyroidism  Resume synthroid.    Diabetes mellitus with hypoglycemia   Insulin dependent.  CBG (last 3)  Recent Labs    08/16/21 0825 08/16/21 1211 08/16/21 1640  GLUCAP 165* 100* 103*   Decrease the Semglee to 12 units from 18 units.  Hemoglobin A1c is 5.    Bradycardia:  Sinus bradycardia.    Non-Hodgkin's lymphoma  treated with chemotherapy and radiation treatment,   Chronic respiratory failure in the setting of chronic diastolic heart failure and OSA.    DVT prophylaxis: Eliquis Code Status: Full code.  Family Communication: none at bedside.  Disposition:   Status is: Inpatient  Remains inpatient appropriate because: IV diuresis.        Consultants:  EP  Cardiology.   Procedures: none.   Antimicrobials: none.   Subjective: Breathing has improved.   Objective: Vitals:   08/15/21 2125 08/16/21 0559 08/16/21 0810 08/16/21 1115  BP: (!) 111/47 (!) 97/40  (!) 103/44  Pulse: 60 62  (!) 55  Resp: 18 20  18   Temp: 98.1 F (36.7 C) 98.1 F (36.7 C)  97.7 F (36.5 C)  TempSrc: Oral Oral  Oral  SpO2: 95% 94% 91% 98%  Weight:  (!) 141.6 kg    Height:        Intake/Output Summary (Last 24 hours) at 08/16/2021 1755 Last data filed at 08/16/2021 1254 Gross per 24 hour  Intake 240 ml  Output 800 ml  Net -560 ml    Filed Weights   08/14/21 2052 08/15/21 0428 08/16/21 0559  Weight: 136 kg (!) 141 kg (!) 141.6 kg    Examination:  General exam: Appears calm and comfortable  Respiratory system: Clear to auscultation. Respiratory effort normal. Cardiovascular system:  S1 & S2 heard, RRR. Improving pedal edema.  Gastrointestinal system: Abdomen is nondistended, soft and nontender. Normal bowel sounds heard. Central nervous system: Alert and oriented. No focal neurological deficits. Extremities: Symmetric 5 x 5 power. Skin: No rashes, lesions or ulcers Psychiatry: Mood & affect appropriate.      Data Reviewed: I have personally reviewed following labs and imaging studies  CBC: Recent Labs  Lab 08/14/21 1040  08/15/21 0332  WBC 5.3 5.0  NEUTROABS 3.4  --   HGB 12.8* 11.4*  HCT 39.1 35.5*  MCV 88.5 88.3  PLT 148* 138*     Basic Metabolic Panel: Recent Labs  Lab 08/14/21 1040 08/15/21 0332 08/16/21 0240  NA 141 141 140  K 3.1* 3.1* 3.3*  CL 105 107 108  CO2 25 22 22   GLUCOSE 115* 60* 88  BUN 68* 62* 64*  CREATININE 4.06* 3.97* 4.16*  CALCIUM 8.9 8.5* 8.3*  MG 2.3 2.2  --      GFR: Estimated Creatinine Clearance: 23 mL/min (A) (by C-G formula based on SCr of 4.16 mg/dL (H)).  Liver Function Tests: No results for input(s): AST, ALT, ALKPHOS, BILITOT, PROT, ALBUMIN in the last 168 hours.  CBG: Recent Labs  Lab 08/16/21 0825 08/16/21 1211 08/16/21 1640  GLUCAP 165* 100* 103*     Recent Results (from the past 240 hour(s))  Resp Panel by RT-PCR (Flu A&B, Covid) Nasopharyngeal Swab     Status: None   Collection Time: 08/14/21 11:21 AM   Specimen: Nasopharyngeal Swab; Nasopharyngeal(NP) swabs in vial transport medium  Result Value Ref Range Status   SARS Coronavirus 2 by RT PCR NEGATIVE NEGATIVE Final    Comment: (NOTE) SARS-CoV-2 target nucleic acids are NOT DETECTED.  The SARS-CoV-2 RNA is generally detectable in upper respiratory specimens during the acute phase of infection. The lowest concentration of SARS-CoV-2 viral copies this assay can detect is 138 copies/mL. A negative result does not preclude SARS-Cov-2 infection and should not be used as the sole basis for treatment or other patient management decisions. A negative result may occur with  improper specimen collection/handling, submission of specimen other than nasopharyngeal swab, presence of viral mutation(s) within the areas targeted by this assay, and inadequate number of viral copies(<138 copies/mL). A negative result must be combined with clinical observations, patient history, and epidemiological information. The expected result is Negative.  Fact Sheet for Patients:   EntrepreneurPulse.com.au  Fact Sheet for Healthcare Providers:  IncredibleEmployment.be  This test is no t yet approved or cleared by the [place] FDA and  has been authorized for detection and/or diagnosis of SARS-CoV-2 by FDA under an Emergency Use Authorization (EUA). This EUA will remain  in effect (meaning this test can be used) for the duration of the COVID-19 declaration under Section 564(b)(1) of the Act, 21 U.S.C.section 360bbb-3(b)(1), unless the authorization is terminated  or revoked sooner.       Influenza A by PCR NEGATIVE NEGATIVE Final   Influenza B by PCR NEGATIVE NEGATIVE Final    Comment: (NOTE) The Xpert Xpress SARS-CoV-2/FLU/RSV plus assay is intended as an aid in the diagnosis of influenza from Nasopharyngeal swab specimens and should not be used as a sole basis for treatment. Nasal washings and aspirates are unacceptable for Xpert Xpress SARS-CoV-2/FLU/RSV testing.  Fact Sheet for Patients: EntrepreneurPulse.com.au  Fact Sheet for Healthcare Providers: IncredibleEmployment.be  This test is not yet approved or cleared by the [place] FDA and has been authorized for detection and/or diagnosis of SARS-CoV-2 by  FDA under an Emergency Use Authorization (EUA). This EUA will remain in effect (meaning this test can be used) for the duration of the COVID-19 declaration under Section 564(b)(1) of the Act, 21 U.S.C. section 360bbb-3(b)(1), unless the authorization is terminated or revoked.  Performed at [facility], [address] [place], [place] [address]           Radiology Studies: No results found.      Scheduled Meds:  amiodarone  200 mg Oral Daily   apixaban  5 mg Oral BID   insulin aspart  0-9 Units Subcutaneous TID WC   insulin glargine-yfgn  18 Units Subcutaneous QHS   levothyroxine  300 mcg Oral QAC breakfast   loratadine  10 mg Oral Daily   midodrine   5 mg Oral TID WC   mometasone-formoterol  2 puff Inhalation BID   potassium chloride  40 mEq Oral BID   simvastatin  5 mg Oral Daily   sodium chloride flush  3 mL Intravenous Q12H   Continuous Infusions:  sodium chloride     furosemide (LASIX) 200 mg in dextrose 5% 100 mL (2mg /mL) infusion 15 mg/hr (08/16/21 1725)   milrinone 0.25 mcg/kg/min (08/16/21 1726)     LOS: 2 days        Hosie Poisson, MD [facility]   To contact the attending provider between 7A-7P or the covering provider during after hours 7P-7A, please log into the web site [email or web address] and access using universal [place] password for that web site. If you do not have the password, please call the hospital operator.  08/16/2021, 5:55 PM

## 2021-08-16 NOTE — Progress Notes (Signed)
Orthopedic Tech Progress Note Patient Details:  [name] [name] 1946/04/09 [id number]  Ortho Devices Type of Ortho Device: Haematologist Ortho Device/Splint Location: Bilateral Ortho Device/Splint Interventions: Ordered, Application   Post Interventions Patient Tolerated: Well Instructions Provided: Care of device  Rosana Hoes 08/16/2021, 3:21 PM

## 2021-08-16 NOTE — Evaluation (Signed)
Physical Therapy Evaluation Patient Details Name: [name] [name] MRN: [id number] DOB: 1945-10-19 Today's Date: 08/16/2021  History of Present Illness  Pt is a 76 yo male returning with LE edema and SOB after recent admission 06/11/21. Upon work up pt with fluid overload and increased O2 demands. PMH includes: CHF,  afib, CAD, CKD III, COPD, HTN, HLD, LBBB, NSTEMI (2018), DM II, Non hodgkins lymphoma, sleep apnea (sleeps in recliner), morbid obesity, and neuropathy. Uses 3-4L O2 at baseline.  Clinical Impression  Pt admitted with above diagnosis. Pt presents with minimal tolerance for activity, unable to manage household distances at this point. However, pt has good set up at home and has aide that comes 1x/wk to help with home mgmt, ad he has an electric scooter so recommending return home with HHPT. Will follow acutely to help him increase activity tolerance. Pt ambulated 2 bouts of 20' with RW with SPO2 dropping to 79% on 3L O2 each time. >3 mins to recover to 90% with cues for pursed lip breathing.  Pt currently with functional limitations due to the deficits listed below (see PT Problem List). Pt will benefit from skilled PT to increase their independence and safety with mobility to allow discharge to the venue listed below.          Recommendations for follow up therapy are one component of a multi-disciplinary discharge planning process, led by the attending physician.  Recommendations may be updated based on patient status, additional functional criteria and insurance authorization.  Follow Up Recommendations Home health PT    Assistance Recommended at Discharge Intermittent Supervision/Assistance  Patient can return home with the following  Help with stairs or ramp for entrance;Assistance with cooking/housework;A little help with bathing/dressing/bathroom;Assist for transportation    Equipment Recommendations None recommended by PT  Recommendations for Other Services       Functional  Status Assessment Patient has had a recent decline in their functional status and demonstrates the ability to make significant improvements in function in a reasonable and predictable amount of time.     Precautions / Restrictions Precautions Precautions: Fall Restrictions Weight Bearing Restrictions: No      Mobility  Bed Mobility Overal bed mobility: Needs Assistance Bed Mobility: Supine to Sit;Sit to Supine     Supine to sit: Supervision Sit to supine: Min assist   General bed mobility comments: pt able to come to EOB with heavy use of rail, increased time, and supervision. Needed min A for LE's back into bed. Pt has adjustable bed at home but sleeps in recliner    Transfers Overall transfer level: Needs assistance Equipment used: Rolling walker (2 wheels) Transfers: Sit to/from Stand;Bed to chair/wheelchair/BSC Sit to Stand: Min assist   Step pivot transfers: Min assist       General transfer comment: had difficulty controlling descent to low BSC and rising from it. Min-guard from elevated bed.    Ambulation/Gait Ambulation/Gait assistance: Min guard Gait Distance (Feet): 20 Feet (2x) Assistive device: Rolling walker (2 wheels) Gait Pattern/deviations: Step-through pattern;Trunk flexed Gait velocity: decreased Gait velocity interpretation: <1.31 ft/sec, indicative of household ambulator   General Gait Details: decreased step height, heavy reliance on RW and SPO2 dropped to 79% with each bout of walking. Pt distance limited by SOB  Stairs            Wheelchair Mobility    Modified Rankin (Stroke Patients Only)       Balance Overall balance assessment: Needs assistance Sitting-balance support: No upper extremity supported;Feet supported  Sitting balance-Leahy Scale: Good     Standing balance support: Single extremity supported Standing balance-Leahy Scale: Fair Standing balance comment: pt able to stand with unilateral support but the effort of this  caused quicker fatigue and O2 desat                             Pertinent Vitals/Pain Pain Assessment: Faces Faces Pain Scale: Hurts little more Pain Location: R thigh with hip flex Pain Descriptors / Indicators: Sore Pain Intervention(s): Limited activity within patient's tolerance;Monitored during session    Home Living Family/patient expects to be discharged to:: Private residence Living Arrangements: Children Available Help at Discharge: Family;Available 24 hours/day;Other (Comment) Type of Home: House Home Access: Stairs to enter Entrance Stairs-Rails: None Entrance Stairs-Number of Steps: 1   Home Layout: One level Home Equipment: Conservation officer, nature (2 wheels);Cane - single point;Hand held shower head;Shower seat;Wheelchair - power;Electric scooter Additional Comments: pt sleeps in recliner, uses 3-4L O2 at home, has pulse ox to monitor. Daughter uses pt's power w/c because she is disabled by back pain. Her husband works. Pt reports they all take care of each other    Prior Function Prior Level of Function : Needs assist       Physical Assist : Mobility (physical) Mobility (physical): Gait   Mobility Comments: can walk bed to bathroom but uses scooter for any distance longer than this ADLs Comments: has an aide that comes 1 day/wk and helps with cooking/ cleaning and dressing pt's legs     Hand Dominance   Dominant Hand: Right    Extremity/Trunk Assessment   Upper Extremity Assessment Upper Extremity Assessment: Generalized weakness    Lower Extremity Assessment Lower Extremity Assessment: Generalized weakness    Cervical / Trunk Assessment Cervical / Trunk Assessment: Normal  Communication   Communication: No difficulties  Cognition Arousal/Alertness: Awake/alert Behavior During Therapy: WFL for tasks assessed/performed Overall Cognitive Status: Within Functional Limits for tasks assessed                                           General Comments      Exercises     Assessment/Plan    PT Assessment Patient needs continued PT services  PT Problem List Decreased strength;Decreased activity tolerance;Decreased balance;Decreased mobility;Cardiopulmonary status limiting activity;Decreased skin integrity;Impaired sensation       PT Treatment Interventions DME instruction;Gait training;Stair training;Functional mobility training;Therapeutic activities;Therapeutic exercise;Balance training;Patient/family education    PT Goals (Current goals can be found in the Care Plan section)  Acute Rehab PT Goals Patient Stated Goal: return home, have more energy PT Goal Formulation: With patient Time For Goal Achievement: 08/30/21 Potential to Achieve Goals: Fair    Frequency Min 3X/week     Co-evaluation               AM-PAC PT "6 Clicks" Mobility  Outcome Measure Help needed turning from your back to your side while in a flat bed without using bedrails?: None Help needed moving from lying on your back to sitting on the side of a flat bed without using bedrails?: A Little Help needed moving to and from a bed to a chair (including a wheelchair)?: A Little Help needed standing up from a chair using your arms (e.g., wheelchair or bedside chair)?: A Little Help needed to walk in hospital room?: A Little Help  needed climbing 3-5 steps with a railing? : A Lot 6 Click Score: 18    End of Session Equipment Utilized During Treatment: Gait belt;Oxygen Activity Tolerance: Patient limited by fatigue Patient left: in bed;with call bell/phone within reach Nurse Communication: Mobility status PT Visit Diagnosis: Muscle weakness (generalized) (M62.81);Difficulty in walking, not elsewhere classified (R26.2)    Time: 1423-1500 PT Time Calculation (min) (ACUTE ONLY): 37 min   Charges:   PT Evaluation $PT Eval Moderate Complexity: 1 Mod PT Treatments $Gait Training: 8-22 mins        Leighton Roach, [place]  Pager [phone] Office [place] 08/16/2021, 3:29 PM

## 2021-08-16 NOTE — TOC Initial Note (Addendum)
Transition of Care [facility]) - Initial/Assessment Note    Patient Details  Name: [name] [name] MRN: [id number] Date of Birth: 09/10/1945  Transition of Care [facility]) CM/SW Contact:    Bethena Roys, RN Phone Number: 08/16/2021, 3:50 PM  Clinical Narrative:  Risk for readmission assessment completed for this patient. Prior to arrival patient was from home with daughter and son-n-law. Patient states he has durable medical equipment (DME) cane, rolling walker, and oxygen via [employer]. Confirmed with [employer] that patient is on 3 Liters @ home. No DME identified at this visit. Patient is currently active with [facility] Nursing and Physical Therapy. [facility] that patient is hospitalized. Awaiting return phone call for confirmation. Patient will need [facility] orders and F2F for [facility] PT/RN once stable. Case Manager will continue to follow for additional toc needs.            1605 08-16-21 Received call from [facility]- they will follow the patient until medically stable to return home and will then make a visit to the home.   Expected Discharge Plan: [place] Barriers to Discharge: Continued Medical Work up   Patient Goals and CMS Choice Patient states their goals for this hospitalization and ongoing recovery are:: to return home      Expected Discharge Plan and Services Expected Discharge Plan: [place] In-house Referral: Clinical Social Work Discharge Planning Services: CM Consult Post Acute Care Choice: Home Health, Resumption of Svcs/PTA Provider Living arrangements for the past 2 months: Single Family Home                 DME Arranged: N/A DME Agency: NA       HH Arranged: RN, Disease Management, PT HH Agency: [facility] Date HH Agency Contacted: 08/16/21 Time [place]: 1549 Representative spoke with at [place]: [place]  Prior Living Arrangements/Services Living arrangements for the past 2  months: [place] with:: Adult Children, Self Patient language and need for interpreter reviewed:: Yes Do you feel safe going back to the place where you live?: Yes      Need for Family Participation in Patient Care: Yes (Comment) Care giver support system in place?: Yes (comment) Current home services: DME, Home PT, Home RN (Patient states he has a rollowing walker, cane, oxygen and electric WC. Active with [facility].) Criminal Activity/Legal Involvement Pertinent to Current Situation/Hospitalization: No - Comment as needed  Activities of Daily Living Home Assistive Devices/Equipment: Wheelchair, Environmental consultant (specify type) ADL Screening (condition at time of admission) Patient's cognitive ability adequate to safely complete daily activities?: Yes Is the patient deaf or have difficulty hearing?: No Does the patient have difficulty seeing, even when wearing glasses/contacts?: No Does the patient have difficulty concentrating, remembering, or making decisions?: No Patient able to express need for assistance with ADLs?: Yes Does the patient have difficulty dressing or bathing?: Yes Independently performs ADLs?: No Communication: Independent Dressing (OT): Needs assistance Is this a change from baseline?: Pre-admission baseline Grooming: Needs assistance Is this a change from baseline?: Pre-admission baseline Feeding: Independent Bathing: Needs assistance Is this a change from baseline?: Pre-admission baseline Toileting: Needs assistance Is this a change from baseline?: Pre-admission baseline In/Out Bed: Needs assistance Is this a change from baseline?: Pre-admission baseline Walks in Home: Needs assistance Is this a change from baseline?: Pre-admission baseline Does the patient have difficulty walking or climbing stairs?: Yes Weakness of Legs: Both Weakness of Arms/Hands: None  Permission Sought/Granted Permission sought to share information with : Family Supports,  Customer service manager, Case Optician, dispensing granted to share information with : Yes, Verbal Permission Granted     Permission granted to share info w AGENCY: [facility]        Emotional Assessment Appearance:: Appears stated age Attitude/Demeanor/Rapport: Engaged Affect (typically observed): Appropriate Orientation: : Oriented to Self, Oriented to Place, Oriented to  Time, Oriented to Situation Alcohol / Substance Use: Not Applicable Psych Involvement: No (comment)  Admission diagnosis:  Hypokalemia [E87.6] Bilateral lower extremity edema [R60.0] Acute on chronic diastolic (congestive) heart failure (HCC) [I50.33] Acute renal failure superimposed on stage 4 chronic kidney disease, unspecified acute renal failure type ([place]) [N17.9, N18.4] Acute on chronic congestive heart failure, unspecified heart failure type ([place]) [I50.9] Patient Active Problem List   Diagnosis Date Noted   Acute on chronic diastolic (congestive) heart failure ([place]) 08/14/2021   CKD (chronic kidney disease) stage 4, GFR 15-29 ml/min ([place]) 01/21/2021   Pleural effusion 01/20/2021   Thrombocytopenia ([place]) 01/20/2021   Elevated d-dimer 01/20/2021   Atrial fibrillation, chronic ([place]) 01/20/2021   CHF exacerbation ([place]) 12/28/2020   CHF (congestive heart failure) ([place]) 12/28/2020   Acute on chronic diastolic HF (heart failure) ([place]) 10/06/2020   Chronic respiratory failure with hypoxia and hypercapnia ([place]) 09/10/2020   Former smoker 08/10/2020   Acute on chronic diastolic CHF (congestive heart failure) ([place]) 08/05/2020   Acute respiratory failure with hypoxia ([place]) 08/04/2020   Acute exacerbation of CHF (congestive heart failure) ([place]) 07/19/2020   AF (paroxysmal atrial fibrillation) ([place]) 07/19/2020   COVID-19    Acute on chronic respiratory failure with hypoxia and hypercapnia ([place]) 06/25/2020   Aspiration pneumonia ([place]) 06/24/2020   Pneumonia due to COVID-19 virus 06/24/2020   Confusion     Small bowel obstruction ([place]) 01/29/2020   SBO (small bowel obstruction) ([place]) 01/28/2020   Primary osteoarthritis of left knee 05/24/2018   Primary localized osteoarthritis of left knee 05/19/2018   Acute on chronic systolic and diastolic heart failure, NYHA class 1 ([place]) 04/01/2017   CAD (coronary artery disease) 04/01/2017   DOE (dyspnea on exertion) 03/21/2017   HTN (hypertension) 03/21/2017   NSTEMI (non-ST elevated myocardial infarction) ([place]) 01/19/2017   NSTEMI, initial episode of care ([place]) 01/19/2017   Sepsis ([place]) 07/24/2016   Elevated troponin 07/24/2016   Fever 07/24/2016   Lactic acidosis 07/24/2016   Adjustment insomnia 05/18/2016   Erectile dysfunction 12/19/2015   Sleep apnea 09/30/2015   Osteoarthritis 09/30/2015   Hypothyroidism 09/30/2015   Hypogonadism in male 09/30/2015   Diabetic neuropathy ([place]) 09/30/2015   Chronic pain of left knee 09/30/2015   Benign essential hypertension 09/30/2015   Acute on chronic renal failure ([place]) 05/14/2015   Diarrhea 05/14/2015   Dehydration 05/14/2015   Hypokalemia 05/14/2015   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[place]) 10/05/2014   Pelvic mass in male    Varices, esophageal (HCC)    Colonic mass    Abnormal CT scan, pelvis    Bladder mass    Elevated liver enzymes    Elevated LFTs    Abdominal pain 07/23/2014   Chronic kidney disease Stage IV 07/23/2014   Morbid obesity ([place]) 07/23/2014   Type 2 diabetes mellitus with stage 4 chronic kidney disease ([place]) 07/23/2014   Hypertension 07/23/2014   S/P total knee replacement, 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   PCP:  Nickola Major, MD Pharmacy:   [facility], [address]  [place] [address] Phone: [phone] Fax: [phone]   Readmission Risk Interventions Readmission Risk Prevention Plan 08/16/2021 01/31/2021 12/29/2020  Transportation Screening Complete Complete Complete  Medication Review Press photographer) Complete - Complete  PCP or  Specialist appointment within 3-5 days of discharge Complete - Complete  HRI or Home Care Consult Complete Complete Complete  SW Recovery Care/Counseling Consult Complete Complete Complete  Palliative Care Screening Not Applicable Complete Not [place] Not Applicable Complete Complete  Some recent data might be hidden

## 2021-08-16 NOTE — Progress Notes (Signed)
Mobility Specialist Progress Note    08/16/21 1108  Mobility  Activity Ambulated in room  Level of Assistance Contact guard assist, steadying assist  Assistive Device Four wheel walker  Distance Ambulated (ft) 32 ft (16+16)  Mobility Ambulated with assistance in room  Mobility Response Tolerated fair  Mobility performed by Mobility specialist  Bed Position Chair  $Mobility charge 1 Mobility   Pre-Mobility: 57 HR, 111/48 BP, 96% SpO2 During Mobility: 86% SpO2  Pt received in chair and agreeable. Ambulated on 3LO2 and encouraged pursed lip breathing for recovery. Took x1 seated rest break for ~3 minutes. Returned to chair with call bell in reach.   [facility] Mobility Specialist  M.S. 2C and 6E: [phone] M.S. 4E: [phone]

## 2021-08-16 NOTE — Social Work (Signed)
CSW acknowledges consult for SNF/HH. The patient will require PT/OT evaluations. TOC will assist with disposition planning once the evaluations have been completed.  °  °TOC will continue to follow.    °

## 2021-08-16 NOTE — Progress Notes (Signed)
Patient ID: [name] [name], male   DOB: Jun 03, 1946, 76 y.o.   MRN: [id number]     Advanced Heart Failure Rounding Note  PCP-Cardiologist: Rozann Lesches, MD   Subjective:    Weight remains up considerably from baseline.  Some diuresis yesterday but not marked.  He remains on milrinone 0.25 with Lasix gtt 12 mg/hr. Creatinine 3.97 => 4.16. SBP 90s this morning.    Objective:   Weight Range: (!) 141.6 kg Body mass index is 41.19 kg/m.   Vital Signs:   Temp:  [97.7 F (36.5 C)-98.1 F (36.7 C)] 98.1 F (36.7 C) (01/14 0559) Pulse Rate:  [48-62] 62 (01/14 0559) Resp:  [18-20] 20 (01/14 0559) BP: (97-111)/(40-60) 97/40 (01/14 0559) SpO2:  [92 %-97 %] 94 % (01/14 0559) Weight:  [141.6 kg] 141.6 kg (01/14 0559) Last BM Date: 08/15/21  Weight change: Filed Weights   08/14/21 2052 08/15/21 0428 08/16/21 0559  Weight: 136 kg (!) 141 kg (!) 141.6 kg    Intake/Output:   Intake/Output Summary (Last 24 hours) at 08/16/2021 0717 Last data filed at 08/16/2021 6644 Gross per 24 hour  Intake 723 ml  Output 925 ml  Net -202 ml      Physical Exam    General:  Well appearing. No resp difficulty HEENT: Normal Neck: Supple. JVP difficult, appears 14-16. Carotids 2+ bilat; no bruits. No lymphadenopathy or thyromegaly appreciated. Cor: PMI nondisplaced. Regular rate & rhythm. 2/6 SEM RUSB. Lungs: Clear Abdomen: Soft, nontender, nondistended. No hepatosplenomegaly. No bruits or masses. Good bowel sounds. Extremities: No cyanosis, clubbing, rash. 2+ edema to knees.  Neuro: Alert & orientedx3, cranial nerves grossly intact. moves all 4 extremities w/o difficulty. Affect pleasant   Telemetry   Sinus with 1st degree AVB, 60s.   Labs    CBC Recent Labs    08/14/21 1040 08/15/21 0332  WBC 5.3 5.0  NEUTROABS 3.4  --   HGB 12.8* 11.4*  HCT 39.1 35.5*  MCV 88.5 88.3  PLT 148* 034*   Basic Metabolic Panel Recent Labs    08/14/21 1040 08/15/21 0332 08/16/21 0240  NA 141 141  140  K 3.1* 3.1* 3.3*  CL 105 107 108  CO2 25 22 22   GLUCOSE 115* 60* 88  BUN 68* 62* 64*  CREATININE 4.06* 3.97* 4.16*  CALCIUM 8.9 8.5* 8.3*  MG 2.3 2.2  --    Liver Function Tests No results for input(s): AST, ALT, ALKPHOS, BILITOT, PROT, ALBUMIN in the last 72 hours. No results for input(s): LIPASE, AMYLASE in the last 72 hours. Cardiac Enzymes No results for input(s): CKTOTAL, CKMB, CKMBINDEX, TROPONINI in the last 72 hours.  BNP: BNP (last 3 results) Recent Labs    06/11/21 1338 07/15/21 1531 08/14/21 1040  BNP 199.1* 100.1* 342.0*    ProBNP (last 3 results) No results for input(s): PROBNP in the last 8760 hours.   D-Dimer No results for input(s): DDIMER in the last 72 hours. Hemoglobin A1C Recent Labs    08/15/21 1758  HGBA1C 5.0   Fasting Lipid Panel No results for input(s): CHOL, HDL, LDLCALC, TRIG, CHOLHDL, LDLDIRECT in the last 72 hours. Thyroid Function Tests Recent Labs    08/14/21 1040  TSH 1.652    Other results:   Imaging    No results found.   Medications:     Scheduled Medications:  amiodarone  200 mg Oral Daily   apixaban  5 mg Oral BID   insulin aspart  0-9 Units Subcutaneous TID WC  insulin glargine-yfgn  18 Units Subcutaneous QHS   levothyroxine  300 mcg Oral QAC breakfast   loratadine  10 mg Oral Daily   metolazone  2.5 mg Oral Once   midodrine  5 mg Oral TID WC   mometasone-formoterol  2 puff Inhalation BID   potassium chloride  40 mEq Oral BID   simvastatin  5 mg Oral Daily   sodium chloride flush  3 mL Intravenous Q12H    Infusions:  sodium chloride     furosemide (LASIX) 200 mg in dextrose 5% 100 mL (2mg /mL) infusion 12 mg/hr (08/16/21 0317)   milrinone 0.25 mcg/kg/min (08/15/21 2135)    PRN Medications: sodium chloride, acetaminophen **OR** acetaminophen, ipratropium-albuterol, nitroGLYCERIN, ondansetron **OR** ondansetron (ZOFRAN) IV, senna-docusate, sodium chloride, sodium chloride flush,  traZODone   Assessment/Plan   1. Acute on chronic HF with mid range EF: Also with prominent RV failure.  In setting of CKD stage IV.  RHC 11/22 RA mean, PCWP mean 18, moderate to severe PAH with PVR 4 WU, CI low at 1.94. Echo this admission with EF 45-50%, mild LV dilation, mild-moderately decreased RV function with mild RVE, PASP 55.  Weight is up about 25 lbs from prior discharge.  He was admitted with worsening dyspnea and edema.  On exam, he remains significantly volume overloaded. He was started on milrinone 0.25 for RV support and Lasix gtt.  Diuresis was not vigorous yesterday, creatinine 4.16. SBP 90s.  - Continue milrinone 0.25 mcg/kg/min for RV support (should also lower PA pressure).   - Add midodrine to keep BP up.  - Increase Lasix gtt to 15 mg/hr and add metolazone 2.5 x 1.  Replace K.   - Unna boots.   2. Pulmonary hypertension:  Moderate to severe PAH with PVR 4 WU on RHC 11/22. 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 No chronic PE on recent V/Q scan.  - Not candidate for pulmonary vasodilators.  3. Atrial fibrillation: DC-CV in 11/22, started on amiodarone.  He was admitted bradycardic in 40s, concern for underlying junctional rhythm/heart block with regularized afib but baseline poor.  Currently, NSR with long 1st degree AVB in 60s.  - Bisoprolol stopped with bradycardia, leave off.  - Discussed with EP, restarted amiodarone 200 mg daily.  - Continue apixaban 5 mg bid.  4. CAD: S/p NSTEMI with occluded OM1 in 6/18, had DES to 80% stenosis mLAD at that time.  No chest pain. No coronary angiography in absence of ACS given CKD stage 4.  - Continue statin.  - No ASA given Eliquis use.  5. AKI on CKD: Stage IV. Baseline creatinine 3.  Creatinine 3.97 => 4.16.  Volume overloaded, diuresing with milrinone added for RV support.  6. Type 2 DM: Per primary service.   7. Chronic hypoxemic respiratory failure: He is on 2L home oxygen.  Sleep study with only mild OSA in 12/22 but with oxygen  desaturation.  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  He smoked in the past but not heavily.  - Would get PFTs after diuresis   Length of Stay: 2  [name], MD  08/16/2021, 7:17 AM  Advanced Heart Failure Team Pager [phone] (M-F; 7a - 5p)  Please contact [facility] for night-coverage after hours (5p -7a ) and weekends on [email or web address]

## 2021-08-17 ENCOUNTER — Inpatient Hospital Stay (HOSPITAL_COMMUNITY): Payer: Medicare HMO

## 2021-08-17 DIAGNOSIS — I5033 Acute on chronic diastolic (congestive) heart failure: Secondary | ICD-10-CM | POA: Diagnosis not present

## 2021-08-17 LAB — BASIC METABOLIC PANEL
Anion gap: 15 (ref 5–15)
BUN: 63 mg/dL — ABNORMAL HIGH (ref 8–23)
CO2: 20 mmol/L — ABNORMAL LOW (ref 22–32)
Calcium: 8.7 mg/dL — ABNORMAL LOW (ref 8.9–10.3)
Chloride: 104 mmol/L (ref 98–111)
Creatinine, Ser: 4.23 mg/dL — ABNORMAL HIGH (ref 0.61–1.24)
GFR, Estimated: 14 mL/min — ABNORMAL LOW (ref >60)
Glucose, Bld: 112 mg/dL — ABNORMAL HIGH (ref 70–99)
Potassium: 3.5 mmol/L (ref 3.5–5.1)
Sodium: 139 mmol/L (ref 135–145)

## 2021-08-17 LAB — URINALYSIS, ROUTINE W REFLEX MICROSCOPIC
Bilirubin Urine: NEGATIVE
Glucose, UA: NEGATIVE mg/dL
Ketones, ur: NEGATIVE mg/dL
Leukocytes,Ua: NEGATIVE
Nitrite: NEGATIVE
Protein, ur: NEGATIVE mg/dL
Specific Gravity, Urine: 1.01 (ref 1.005–1.030)
pH: 5.5 (ref 5.0–8.0)

## 2021-08-17 LAB — SODIUM, URINE, RANDOM: Sodium, Ur: 105 mmol/L

## 2021-08-17 LAB — URINALYSIS, MICROSCOPIC (REFLEX): Squamous Epithelial / HPF: NONE SEEN (ref 0–5)

## 2021-08-17 LAB — GLUCOSE, CAPILLARY
Glucose-Capillary: 122 mg/dL — ABNORMAL HIGH (ref 70–99)
Glucose-Capillary: 118 mg/dL — ABNORMAL HIGH (ref 70–99)
Glucose-Capillary: 124 mg/dL — ABNORMAL HIGH (ref 70–99)
Glucose-Capillary: 107 mg/dL — ABNORMAL HIGH (ref 70–99)

## 2021-08-17 LAB — CREATININE, URINE, RANDOM: Creatinine, Urine: 19.94 mg/dL

## 2021-08-17 MED ORDER — METOLAZONE 5 MG PO TABS
5.0000 mg | ORAL_TABLET | Freq: Two times a day (BID) | ORAL | Status: AC
  Administered 2021-08-17 (×2): 5 mg via ORAL
  Filled 2021-08-17 (×2): qty 1

## 2021-08-17 MED ORDER — SODIUM CHLORIDE 0.9% FLUSH: 3.0000 mL | INTRAVENOUS | Status: DC | PRN

## 2021-08-17 MED ORDER — SODIUM CHLORIDE 0.9 % IV SOLN: INTRAVENOUS | Status: DC

## 2021-08-17 MED ORDER — SODIUM CHLORIDE 0.9% FLUSH
3.0000 mL | Freq: Two times a day (BID) | INTRAVENOUS | Status: DC
  Administered 2021-08-18 – 2021-08-21 (×6): 3 mL via INTRAVENOUS

## 2021-08-17 MED ORDER — POTASSIUM CHLORIDE CRYS ER 20 MEQ PO TBCR
40.0000 meq | EXTENDED_RELEASE_TABLET | Freq: Two times a day (BID) | ORAL | Status: AC
  Administered 2021-08-17 (×2): 40 meq via ORAL
  Filled 2021-08-17 (×2): qty 2

## 2021-08-17 MED ORDER — SODIUM CHLORIDE 0.9 % IV SOLN: 250.0000 mL | INTRAVENOUS | Status: DC | PRN

## 2021-08-17 MED ORDER — POLYVINYL ALCOHOL 1.4 % OP SOLN
1.0000 [drp] | OPHTHALMIC | Status: DC | PRN
  Administered 2021-08-17: 1 [drp] via OPHTHALMIC
  Filled 2021-08-17: qty 15

## 2021-08-17 MED ORDER — ASPIRIN 81 MG PO CHEW
81.0000 mg | CHEWABLE_TABLET | ORAL | Status: AC
  Administered 2021-08-18: 81 mg via ORAL
  Filled 2021-08-17: qty 1

## 2021-08-17 NOTE — H&P (View-Only) (Signed)
Patient ID: [name] [name], male   DOB: [birth year], 76 y.o.   MRN: [id number]     Advanced Heart Failure Rounding Note  PCP-Cardiologist: Rozann Lesches, MD   Subjective:    Weight remains up considerably from baseline.  I/Os net negative 1025.  He remains on milrinone 0.25 with Lasix gtt 15 mg/hr. Creatinine 3.97 => 4.16 => 4.23. SBP 100s this morning now on midodrine.    Objective:   Weight Range: (!) 141.6 kg Body mass index is 41.19 kg/m.   Vital Signs:   Temp:  [97.7 F (36.5 C)-98.4 F (36.9 C)] 98.4 F (36.9 C) (01/15 0419) Pulse Rate:  [55-76] 76 (01/14 2121) Resp:  [17-18] 17 (01/15 0419) BP: (103-116)/(44-51) 108/50 (01/15 0419) SpO2:  [90 %-98 %] 96 % (01/15 0419) Last BM Date: 08/15/21  Weight change: Filed Weights   08/14/21 2052 08/15/21 0428 08/16/21 0559  Weight: 136 kg (!) 141 kg (!) 141.6 kg    Intake/Output:   Intake/Output Summary (Last 24 hours) at 08/17/2021 0720 Last data filed at 08/17/2021 0416 Gross per 24 hour  Intake 1774.87 ml  Output 2800 ml  Net -1025.13 ml      Physical Exam    General: NAD Neck: Thick, JVP difficult but suspect elevated, no thyromegaly or thyroid nodule.  Lungs: Clear to auscultation bilaterally with normal respiratory effort. CV: Nondisplaced PMI.  Heart regular S1/S2, no S3/S4, 2/6 SEM RUSB.  2+ edema to thighs.  Abdomen: Soft, nontender, no hepatosplenomegaly, no distention.  Skin: Intact without lesions or rashes.  Neurologic: Alert and oriented x 3.  Psych: Normal affect. Extremities: No clubbing or cyanosis.  HEENT: Normal.    Telemetry   Sinus with 1st degree AVB, 60s. Personally reviewed.   Labs    CBC Recent Labs    08/14/21 1040 08/15/21 0332  WBC 5.3 5.0  NEUTROABS 3.4  --   HGB 12.8* 11.4*  HCT 39.1 35.5*  MCV 88.5 88.3  PLT 148* 947*   Basic Metabolic Panel Recent Labs    08/14/21 1040 08/15/21 0332 08/16/21 0240 08/17/21 0603  NA 141 141 140 139  K 3.1* 3.1* 3.3* 3.5  CL  105 107 108 104  CO2 25 22 22  20*  GLUCOSE 115* 60* 88 112*  BUN 68* 62* 64* 63*  CREATININE 4.06* 3.97* 4.16* 4.23*  CALCIUM 8.9 8.5* 8.3* 8.7*  MG 2.3 2.2  --   --    Liver Function Tests No results for input(s): AST, ALT, ALKPHOS, BILITOT, PROT, ALBUMIN in the last 72 hours. No results for input(s): LIPASE, AMYLASE in the last 72 hours. Cardiac Enzymes No results for input(s): CKTOTAL, CKMB, CKMBINDEX, TROPONINI in the last 72 hours.  BNP: BNP (last 3 results) Recent Labs    06/11/21 1338 07/15/21 1531 08/14/21 1040  BNP 199.1* 100.1* 342.0*    ProBNP (last 3 results) No results for input(s): PROBNP in the last 8760 hours.   D-Dimer No results for input(s): DDIMER in the last 72 hours. Hemoglobin A1C Recent Labs    08/15/21 1758  HGBA1C 5.0   Fasting Lipid Panel No results for input(s): CHOL, HDL, LDLCALC, TRIG, CHOLHDL, LDLDIRECT in the last 72 hours. Thyroid Function Tests Recent Labs    08/14/21 1040  TSH 1.652    Other results:   Imaging    No results found.   Medications:     Scheduled Medications:  amiodarone  200 mg Oral Daily   apixaban  5 mg Oral BID  insulin aspart  0-9 Units Subcutaneous TID WC   insulin glargine-yfgn  12 Units Subcutaneous QHS   levothyroxine  300 mcg Oral QAC breakfast   loratadine  10 mg Oral Daily   metolazone  5 mg Oral BID   midodrine  5 mg Oral TID WC   mometasone-formoterol  2 puff Inhalation BID   potassium chloride  40 mEq Oral BID   simvastatin  5 mg Oral Daily   sodium chloride flush  3 mL Intravenous Q12H   sodium chloride flush  3 mL Intravenous Q12H    Infusions:  sodium chloride     furosemide (LASIX) 200 mg in dextrose 5% 100 mL (2mg /mL) infusion 15 mg/hr (08/17/21 0637)   milrinone 0.25 mcg/kg/min (08/17/21 0415)    PRN Medications: sodium chloride, acetaminophen **OR** acetaminophen, ipratropium-albuterol, nitroGLYCERIN, ondansetron **OR** ondansetron (ZOFRAN) IV, polyvinyl alcohol,  senna-docusate, sodium chloride, sodium chloride flush, traZODone   Assessment/Plan   1. Acute on chronic HF with mid range EF: Also with prominent RV failure.  In setting of CKD stage IV.  RHC 11/22 RA mean, PCWP mean 18, moderate to severe PAH with PVR 4 WU, CI low at 1.94. Echo this admission with EF 45-50%, mild LV dilation, mild-moderately decreased RV function with mild RVE, PASP 55.  Weight is up about 25 lbs from prior discharge.  He was admitted with worsening dyspnea and edema.  He was started on milrinone 0.25 for RV support and Lasix gtt.  JVP difficult but marked peripheral edema.  Some diuresis yesterday, no weight yet.  Creatinine mildly higher at 4.23.  - Continue milrinone 0.25 mcg/kg/min for RV support (should also lower PA pressure).   - Continue midodrine to keep BP up.  - Increase Lasix gtt to 20 mg/hr and add metolazone 5 mg bid x 2 doses.  Replace K.   - Unna boots.   2. Pulmonary hypertension:  Moderate to severe PAH with PVR 4 WU on RHC 11/22. 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 No chronic PE on recent V/Q scan.  - Not candidate for pulmonary vasodilators.  3. Atrial fibrillation: DC-CV in 11/22, started on amiodarone.  He was admitted bradycardic in 40s, concern for underlying junctional rhythm/heart block with regularized afib but baseline poor.  Currently, NSR with long 1st degree AVB in 60s.  - Bisoprolol stopped with bradycardia, leave off.  - Discussed with EP, restarted amiodarone 200 mg daily.  - Continue apixaban 5 mg bid.  4. CAD: S/p NSTEMI with occluded OM1 in 6/18, had DES to 80% stenosis mLAD at that time.  No chest pain. No coronary angiography in absence of ACS given CKD stage 4.  - Continue statin.  - No ASA given Eliquis use.  5. AKI on CKD: Stage IV. Baseline creatinine 3.  Creatinine 3.97 => 4.16 => 4.23.  Volume overloaded, diuresing with milrinone added for RV support. I am concerned that he may eventually need HD for volume management.  6. Type 2 DM:  Per primary service.   7. Chronic hypoxemic respiratory failure: He is on 3L home oxygen.  Sleep study with only mild OSA in 12/22 but with oxygen desaturation.  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  He smoked in the past but not heavily.  - Would get PFTs after diuresis   Length of Stay: 3  [name], MD  08/17/2021, 7:20 AM  Advanced Heart Failure Team Pager [phone] (M-F; 7a - 5p)  Please contact [facility] for night-coverage after hours (5p -7a ) and weekends on  [email or web address]

## 2021-08-17 NOTE — Progress Notes (Signed)
PROGRESS NOTE    [name] [name]  ZHY:[id number] DOB: October 26, 1945 DOA: 08/14/2021 PCP: Nickola Major, MD   Chief Complaint  Patient presents with   Shortness of Breath   Leg Swelling    Brief Narrative:   [name] [name] is a 76 y.o. male with medical history significant for atrial fibrillation status post DCCV 11/18, CAD s/p stenting, CKD stage IV, pulmonary hypertension, essential hypertension, morbid obesity, non-Hodgkin's lymphoma treated with chemotherapy and radiation treatment, diabetes mellitus type 2, OSA with chronic hypoxemic respiratory failure who presented to the ED with worsening abdominal and lower extremity edema.  He is also noticing some dyspnea on exertion and claims he has gained approximately 20 pounds since discharge on 11/22.  Assessment & Plan:   Active Problems:   Type 2 diabetes mellitus with stage 4 chronic kidney disease (HCC)   Hypertension   Acute on chronic systolic and diastolic heart failure, NYHA class 1 (HCC)   Acute on chronic respiratory failure with hypoxia and hypercapnia (HCC)   AF (paroxysmal atrial fibrillation) (HCC)   CKD (chronic kidney disease) stage 4, GFR 15-29 ml/min (HCC)   Acute on chronic  systolic and diastolic CHF:  Diuresing with IV lasix gtt.  Pt started on milrinone gtt.  Continue with strict intake and output, daily weights.  2 D echocardiogram reviewed showed LVEF is 45 to 50%. The left ventricle has mildly decreased function. The left ventricle demonstrates global hypokinesis. The left ventricular internal cavity size was mildly dilated. There is mild left ventricular hypertrophy. Left ventricular diastolic parameters are consistent with Grade III diastolic dysfunction (restrictive). Cardiology consulted and on board.    PAF;  Rate controlled.  S/p cardioversion,  On eliquis for anticoagulation.  Rate controlled with amiodarone.    Acute on Stage 4 CKD;  - possibly cardio renal.  Baseline creatinine around 2.7.   Creatinine on admission was 3.97 , worsened to 4.16 to 4.23.  Nephrology consulted for recommendations.    Hypokalemia Replaced.    Hyperlipidemia:  Resume zocor.    Hypothyroidism  Resume synthroid.    Diabetes mellitus with hypoglycemia  Insulin dependent.  CBG (last 3)  Recent Labs    08/16/21 2249 08/17/21 0724 08/17/21 1205  GLUCAP 85 122* 118*   Decrease the Semglee to 12 units from 18 units.  Hemoglobin A1c is 5.    Bradycardia:  Sinus bradycardia.    Non-Hodgkin's lymphoma  treated with chemotherapy and radiation treatment,  Body mass index is 41.19 kg/m. Obesity.  Mild anemia and thrombocytopenia; Monitor. Check CBC in am.   Chronic respiratory failure in the setting of chronic systolic and diastolic heart failure and OSA.    DVT prophylaxis: Eliquis Code Status: Full code.  Family Communication: none at bedside.  Disposition:   Status is: Inpatient  Remains inpatient appropriate because: IV diuresis.        Consultants:  EP  Cardiology.   Procedures: ECHO  Antimicrobials: none.   Subjective: No new complaints.   Objective: Vitals:   08/16/21 2121 08/17/21 0419 08/17/21 0823 08/17/21 0824  BP: (!) 116/51 (!) 108/50    Pulse: 76     Resp: 18 17    Temp: 98.2 F (36.8 C) 98.4 F (36.9 C)    TempSrc: Oral Oral    SpO2: 90% 96% 96% 96%  Weight:      Height:        Intake/Output Summary (Last 24 hours) at 08/17/2021 1237 Last data filed at 08/17/2021 0416 Gross  per 24 hour  Intake 1534.87 ml  Output 2500 ml  Net -965.13 ml   Filed Weights   08/14/21 2052 08/15/21 0428 08/16/21 0559  Weight: 136 kg (!) 141 kg (!) 141.6 kg    Examination:  General exam: Appears calm and comfortable  Respiratory system: Clear to auscultation. Respiratory effort normal. Cardiovascular system: S1 & S2 heard, RRR. No JVD, 2+ pedal edema. Gastrointestinal system: Abdomen is nondistended, soft and nontender. Normal bowel sounds  heard. Central nervous system: Alert and oriented. No focal neurological deficits. Extremities: Symmetric 5 x 5 power. Skin: No rashes, lesions or ulcers Psychiatry: Mood & affect appropriate.       Data Reviewed: I have personally reviewed following labs and imaging studies  CBC: Recent Labs  Lab 08/14/21 1040 08/15/21 0332  WBC 5.3 5.0  NEUTROABS 3.4  --   HGB 12.8* 11.4*  HCT 39.1 35.5*  MCV 88.5 88.3  PLT 148* 138*    Basic Metabolic Panel: Recent Labs  Lab 08/14/21 1040 08/15/21 0332 08/16/21 0240 08/17/21 0603  NA 141 141 140 139  K 3.1* 3.1* 3.3* 3.5  CL 105 107 108 104  CO2 25 22 22  20*  GLUCOSE 115* 60* 88 112*  BUN 68* 62* 64* 63*  CREATININE 4.06* 3.97* 4.16* 4.23*  CALCIUM 8.9 8.5* 8.3* 8.7*  MG 2.3 2.2  --   --     GFR: Estimated Creatinine Clearance: 22.7 mL/min (A) (by C-G formula based on SCr of 4.23 mg/dL (H)).  Liver Function Tests: No results for input(s): AST, ALT, ALKPHOS, BILITOT, PROT, ALBUMIN in the last 168 hours.  CBG: Recent Labs  Lab 08/16/21 1211 08/16/21 1640 08/16/21 2249 08/17/21 0724 08/17/21 1205  GLUCAP 100* 103* 85 122* 118*     Recent Results (from the past 240 hour(s))  Resp Panel by RT-PCR (Flu A&B, Covid) Nasopharyngeal Swab     Status: None   Collection Time: 08/14/21 11:21 AM   Specimen: Nasopharyngeal Swab; Nasopharyngeal(NP) swabs in vial transport medium  Result Value Ref Range Status   SARS Coronavirus 2 by RT PCR NEGATIVE NEGATIVE Final    Comment: (NOTE) SARS-CoV-2 target nucleic acids are NOT DETECTED.  The SARS-CoV-2 RNA is generally detectable in upper respiratory specimens during the acute phase of infection. The lowest concentration of SARS-CoV-2 viral copies this assay can detect is 138 copies/mL. A negative result does not preclude SARS-Cov-2 infection and should not be used as the sole basis for treatment or other patient management decisions. A negative result may occur with  improper  specimen collection/handling, submission of specimen other than nasopharyngeal swab, presence of viral mutation(s) within the areas targeted by this assay, and inadequate number of viral copies(<138 copies/mL). A negative result must be combined with clinical observations, patient history, and epidemiological information. The expected result is Negative.  Fact Sheet for Patients:  EntrepreneurPulse.com.au  Fact Sheet for Healthcare Providers:  IncredibleEmployment.be  This test is no t yet approved or cleared by the [place] FDA and  has been authorized for detection and/or diagnosis of SARS-CoV-2 by FDA under an Emergency Use Authorization (EUA). This EUA will remain  in effect (meaning this test can be used) for the duration of the COVID-19 declaration under Section 564(b)(1) of the Act, 21 U.S.C.section 360bbb-3(b)(1), unless the authorization is terminated  or revoked sooner.       Influenza A by PCR NEGATIVE NEGATIVE Final   Influenza B by PCR NEGATIVE NEGATIVE Final    Comment: (NOTE) The Xpert  Xpress SARS-CoV-2/FLU/RSV plus assay is intended as an aid in the diagnosis of influenza from Nasopharyngeal swab specimens and should not be used as a sole basis for treatment. Nasal washings and aspirates are unacceptable for Xpert Xpress SARS-CoV-2/FLU/RSV testing.  Fact Sheet for Patients: EntrepreneurPulse.com.au  Fact Sheet for Healthcare Providers: IncredibleEmployment.be  This test is not yet approved or cleared by the [place] FDA and has been authorized for detection and/or diagnosis of SARS-CoV-2 by FDA under an Emergency Use Authorization (EUA). This EUA will remain in effect (meaning this test can be used) for the duration of the COVID-19 declaration under Section 564(b)(1) of the Act, 21 U.S.C. section 360bbb-3(b)(1), unless the authorization is terminated or revoked.  Performed at  [facility], [address] [place], [place] [address]           Radiology Studies: No results found.      Scheduled Meds:  amiodarone  200 mg Oral Daily   apixaban  5 mg Oral BID   [START ON 08/18/2021] aspirin  81 mg Oral Pre-Cath   insulin aspart  0-9 Units Subcutaneous TID WC   insulin glargine-yfgn  12 Units Subcutaneous QHS   levothyroxine  300 mcg Oral QAC breakfast   loratadine  10 mg Oral Daily   metolazone  5 mg Oral BID   midodrine  5 mg Oral TID WC   mometasone-formoterol  2 puff Inhalation BID   potassium chloride  40 mEq Oral BID   simvastatin  5 mg Oral Daily   sodium chloride flush  3 mL Intravenous Q12H   sodium chloride flush  3 mL Intravenous Q12H   Continuous Infusions:  sodium chloride     sodium chloride     sodium chloride     furosemide (LASIX) 200 mg in dextrose 5% 100 mL (2mg /mL) infusion 20 mg/hr (08/17/21 0833)   milrinone 0.25 mcg/kg/min (08/17/21 0415)     LOS: 3 days        Hosie Poisson, MD [facility]   To contact the attending provider between 7A-7P or the covering provider during after hours 7P-7A, please log into the web site [email or web address] and access using universal [place] password for that web site. If you do not have the password, please call the hospital operator.  08/17/2021, 12:37 PM

## 2021-08-17 NOTE — Consult Note (Signed)
Renal Service Consult Note [facility]  [name] [name] 08/17/2021 Sol Blazing, MD Requesting Physician: Dr. Karleen Hampshire  Reason for Consult: Renal failure HPI: The patient is a 76 y.o. year-old w/ hx of atrial fib, CAD hx of PCI, CKD stage 4, pulm HTN, HTN, morbid obesity, DM2, h/o NHL, OSA and chronic resp failure who presented to ED  08/14/21 w/ abd pain and LE edema, some DOE and 20 pd wt gain. Pt was admitted for acute/ chronic combined CHF. Pt seen by CHF team and started on IV lasix 40 bid. Next day 1/13 changed to lasix gtt at 15 mg/ hr and milrinone gtt was started. Creat was 3.97 on admission and is 4.2 today w/ BUN 63.  Asked to see for renal failure.   SBP 100s this am, on midodrine. Lasix IV ^'d to 20 mg /hr. I/O yest net neg 1025. Bisoprolol stopped d/t bradycardia. Amiodarone restarted po 272m /d.   [place] [facility] Uses a walker and a WC to get around.  No recent chills, fevers, prod cough or chest pain. No n/v, no jerking of the extremities or confusion.   ROS - denies CP, no joint pain, no HA, no blurry vision, no rash, no diarrhea, no nausea/ vomiting, no dysuria, no difficulty voiding   Past Medical History  Past Medical History:  Diagnosis Date   Atrial fibrillation (HCC)    CAD (coronary artery disease)    DES to LAD June 2018   CKD (chronic kidney disease) stage 3, GFR 30-59 ml/min (HCC)    Essential hypertension    History of pneumonia    Hyperlipidemia    Hypothyroidism    LBBB (left bundle branch block)    Morbid [place]    [place] Hodgkin's [place]    Status post XRT and chemotherapy   NSTEMI (non-ST elevated myocardial infarction) ([facility]    June 2018   Peripheral neuropathy    Pneumonia due to COVID-19 virus    Sleep apnea    [place] 2 [place]    Past Surgical History  Past Surgical History:  Procedure Laterality Date   CARDIOVERSION N/A 06/20/2021    Procedure: CARDIOVERSION;  Surgeon: MLarey Dresser MD;  [place] [facility]  Service: Cardiovascular;  Laterality: N/A;   CHOLECYSTECTOMY  1992   COLONOSCOPY N/A 09/03/2014   SLF:six colon polyps removed/small internal hemorrhoids   CORONARY STENT INTERVENTION N/A 01/21/2017   Procedure: Coronary Stent Intervention;  Surgeon: ENelva Bush MD;  [facility];  Service: Cardiovascular;  Laterality: N/A;   ESOPHAGOGASTRODUODENOSCOPY N/A 09/03/2014   SLF: mild gastritis/few gastric polyps   LEFT HEART CATH AND CORONARY ANGIOGRAPHY N/A 01/20/2017   Procedure: Left Heart Cath and Coronary Angiography;  Surgeon: VJettie Booze MD;  [facility];  Service: Cardiovascular;  Laterality: N/A;   RIGHT HEART CATH N/A 06/19/2021   Procedure: RIGHT HEART CATH;  Surgeon: MLarey Dresser MD;  [facility];  Service: Cardiovascular;  Laterality: N/A;   TEE WITHOUT CARDIOVERSION N/A 06/20/2021   Procedure: TRANSESOPHAGEAL ECHOCARDIOGRAM (TEE);  Surgeon: MLarey Dresser MD;  [facility];  Service: Cardiovascular;  Laterality: N/A;   TOTAL KNEE ARTHROPLASTY  11/10/2011   Procedure: TOTAL KNEE ARTHROPLASTY;  Surgeon: MMauri Pole MD;  [facility];  Service: Orthopedics;  Laterality: Right;   TOTAL KNEE ARTHROPLASTY Left 05/24/2018   Procedure:  LEFT TOTAL KNEE ARTHROPLASTY;  Surgeon: Melrose Nakayama, MD;  Location: [facility];  Service: Orthopedics;  Laterality: Left;   Family History  Family History  Problem Relation Age of Onset   Cancer Mother        breast and lung   Cancer Father        bladder   Cancer Maternal Uncle        prostate   Cancer Paternal Uncle        esophagus   Colon cancer Neg Hx    Social History  reports that he quit smoking about 22 months ago. His smoking use included cigarettes. He has a 30.00 pack-year smoking history. He has never used smokeless tobacco. He reports that he does not drink alcohol and  does not use drugs. Allergies No Known Allergies Home medications Prior to Admission medications   Medication Sig Start Date End Date Taking? Authorizing Provider  acetaminophen (TYLENOL) 325 MG tablet Take 2 tablets (650 mg total) by mouth every 6 (six) hours as needed for mild pain (or Fever >/= 101). 02/01/20  Yes Emokpae, Courage, MD  amiodarone (PACERONE) 200 MG tablet Take 200 mg by mouth daily.   Yes [provider]  amLODipine (NORVASC) 10 MG tablet Take 10 mg by mouth daily. 08/03/21  Yes [provider]  apixaban (ELIQUIS) 5 MG TABS tablet Take 1 tablet (5 mg total) by mouth 2 (two) times daily. This is a dose change 01/07/21  Yes Verta Ellen., NP  bisoprolol (ZEBETA) 5 MG tablet Take 5 mg by mouth daily. 08/03/21  Yes [provider]  fluticasone (FLONASE) 50 MCG/ACT nasal spray Place 2 sprays into both nostrils daily as needed for allergies.   Yes [provider]  insulin degludec (TRESIBA FLEXTOUCH) 200 UNIT/ML FlexTouch Pen Inject 25 Units into the skin daily.   Yes [provider]  ipratropium-albuterol (DUONEB) 0.5-2.5 (3) MG/3ML SOLN Inhale 3 mLs into the lungs every 4 (four) hours as needed (shortness of breath). 05/22/20  Yes [provider]  isosorbide mononitrate (IMDUR) 30 MG 24 hr tablet Take 1 tablet (30 mg total) by mouth daily. 12/04/20  Yes Satira Sark, MD  levocetirizine (XYZAL) 5 MG tablet Take 5 mg by mouth daily. 08/27/17  Yes [provider]  levothyroxine (SYNTHROID) 100 MCG tablet Take 100 mcg by mouth daily before breakfast. (Takes with 200 mcg tab for a total of 300 mcg once daily)   Yes [provider]  levothyroxine (SYNTHROID, LEVOTHROID) 200 MCG tablet Take 300 mcg by mouth daily before breakfast. (takes with 1107mg tab for a total of 3055m)   Yes [provider]  nitroGLYCERIN (NITROSTAT) 0.4 MG SL tablet Place 0.4 mg under the tongue every 5 (five) minutes as needed for  chest pain.  12/27/17  Yes [provider]  OXYGEN Inhale 3-4 L into the lungs continuous.   Yes [provider]  senna-docusate (SENOKOT-S) 8.6-50 MG tablet Take 1 tablet by mouth daily as needed for mild constipation. 02/01/20  Yes [provider]  simvastatin (ZOCOR) 5 MG tablet Take 5 mg by mouth daily. 09/14/16  Yes [provider]  sodium chloride (OCEAN) 0.65 % SOLN nasal spray Place 1 spray into both nostrils as needed for congestion.   Yes [provider]  SYMBICORT 160-4.5 MCG/ACT inhaler Inhale 2 puffs into the lungs daily as needed (shortness of breath). 12/22/19  Yes [provider]  torsemide (DEMADEX) 20 MG tablet Take 2 tablets (  40 mg total) by mouth 2 (two) times daily. 07/15/21  Yes Rosita Fire, Brittainy M, PA-C  potassium chloride SA (KLOR-CON) 10 MEQ tablet Take 1 tablet (10 mEq total) by mouth daily. Patient not taking: Reported on 08/14/2021 01/24/21   Irwin Brakeman L, MD  insulin aspart (NOVOLOG) 100 UNIT/ML injection Inject 0-15 Units into the skin 3 (three) times daily with meals. Patient not taking: No sig reported 12/31/20 01/22/21  Deatra James, MD     Vitals:   08/16/21 2121 08/17/21 0419 08/17/21 0823 08/17/21 0824  BP: (!) 116/51 (!) 108/50    Pulse: 76     Resp: 18 17    Temp: 98.2 F (36.8 C) 98.4 F (36.9 C)    TempSrc: Oral Oral    SpO2: 90% 96% 96% 96%  Weight:      Height:       Exam Gen alert, no distress No rash, cyanosis or gangrene Sclera anicteric, throat clear  + JVD  Chest clear L side, dec'd R base 1/3 up RRR no MRG Abd soft ntnd no mass or ascites +bs GU normal male MS no joint effusions or deformity Ext 2-3+ bilat hip and lower leg edema Neuro is alert, Ox 3 , nf   Home meds include - norvasc, amiodarone, eliquis, zebeta, tresiba insulin, duoneb, imdur, synthroid, sl ntg, zocor, demadex 40 bid, klor-con 10 qd, prns/ vits/ supps     Date   Creat  eGFR   Jan - June 2022 2.25-  3.60    July- sept 2022 2.48- 3.00      Oct - dec 2022 2.38- 3.32 19- 28 ml/min, stage IV    Jan 12  4.06  15      Jan 13  3.97     Jan 14   4.16      Aug 17 2020 4.23  14     UA pending    UNa, UCr pending   Renal US -pending    CXR 1/12 - IMPRESSION: Stable cardiomegaly with mild central pulmonary vascular congestion and possible early edema.     Na 141  K 3.5  co2 20  BUN 63  Creat 4.23  BNP 342  trop 24  wBC 5K Hb 11     eCHO 1/12 - LVEF 40-45%, mild LVH, global hypoK, G3 diast dysfunction. RV dec'd function, ^PA pressures    Assessment/ Plan: AKI on CKD - b/l creatinine 2.3- 3.3, egfr 19- 28 ml/min from late 2022. Pt admitted w/ creat 4.0 in setting of decomp d/s CHF, 20 lb wt gain w/ LE and borderline edema by CXR.  Asked to see for possible need for dialysis. Diuresis today looks better, would hold off on HD for now, no acute indication. Consider zaroxolyn 62m bid if needed to augment diuresis w/ IV lasix gtt. Will get UA, lytes and renal [place] We will follow along. Volume overload - as above A/C combined syst/ diast CHF - getting IV milrinone and IV lasix gtt. EF 40-45% by echo w/ dec'd RV function and pulm htn.  HTN - home meds on hold, BP's in 100s today.  Resp failure - on 3 L [place], admit CXR borderline edema      RKelly Splinter MD 08/17/2021, 3:52 PM  Recent Labs  Lab 08/14/21 1040 08/15/21 0332  WBC 5.3 5.0  HGB 12.8* 11.4*   Recent Labs  Lab 08/16/21 0240 08/17/21 0603  K 3.3* 3.5  BUN 64* 63*  CREATININE 4.16* 4.23*  CALCIUM 8.3* 8.7*

## 2021-08-17 NOTE — Progress Notes (Addendum)
CBG @ 22:49 = 85-per pt glucose tends to drop overnight 12u Semglee held & pt given 8 oz apple juice & 2 packs of graham crackers  A1C on this admission (1/13) was 5.0.

## 2021-08-17 NOTE — Progress Notes (Signed)
Patient ID: [name] [name], male   DOB: [birth year], 76 y.o.   MRN: [id number]     Advanced Heart Failure Rounding Note  PCP-Cardiologist: Rozann Lesches, MD   Subjective:    Weight remains up considerably from baseline.  I/Os net negative 1025.  He remains on milrinone 0.25 with Lasix gtt 15 mg/hr. Creatinine 3.97 => 4.16 => 4.23. SBP 100s this morning now on midodrine.    Objective:   Weight Range: (!) 141.6 kg Body mass index is 41.19 kg/m.   Vital Signs:   Temp:  [97.7 F (36.5 C)-98.4 F (36.9 C)] 98.4 F (36.9 C) (01/15 0419) Pulse Rate:  [55-76] 76 (01/14 2121) Resp:  [17-18] 17 (01/15 0419) BP: (103-116)/(44-51) 108/50 (01/15 0419) SpO2:  [90 %-98 %] 96 % (01/15 0419) Last BM Date: 08/15/21  Weight change: Filed Weights   08/14/21 2052 08/15/21 0428 08/16/21 0559  Weight: 136 kg (!) 141 kg (!) 141.6 kg    Intake/Output:   Intake/Output Summary (Last 24 hours) at 08/17/2021 0720 Last data filed at 08/17/2021 0416 Gross per 24 hour  Intake 1774.87 ml  Output 2800 ml  Net -1025.13 ml      Physical Exam    General: NAD Neck: Thick, JVP difficult but suspect elevated, no thyromegaly or thyroid nodule.  Lungs: Clear to auscultation bilaterally with normal respiratory effort. CV: Nondisplaced PMI.  Heart regular S1/S2, no S3/S4, 2/6 SEM RUSB.  2+ edema to thighs.  Abdomen: Soft, nontender, no hepatosplenomegaly, no distention.  Skin: Intact without lesions or rashes.  Neurologic: Alert and oriented x 3.  Psych: Normal affect. Extremities: No clubbing or cyanosis.  HEENT: Normal.    Telemetry   Sinus with 1st degree AVB, 60s. Personally reviewed.   Labs    CBC Recent Labs    08/14/21 1040 08/15/21 0332  WBC 5.3 5.0  NEUTROABS 3.4  --   HGB 12.8* 11.4*  HCT 39.1 35.5*  MCV 88.5 88.3  PLT 148* 557*   Basic Metabolic Panel Recent Labs    08/14/21 1040 08/15/21 0332 08/16/21 0240 08/17/21 0603  NA 141 141 140 139  K 3.1* 3.1* 3.3* 3.5  CL  105 107 108 104  CO2 25 22 22  20*  GLUCOSE 115* 60* 88 112*  BUN 68* 62* 64* 63*  CREATININE 4.06* 3.97* 4.16* 4.23*  CALCIUM 8.9 8.5* 8.3* 8.7*  MG 2.3 2.2  --   --    Liver Function Tests No results for input(s): AST, ALT, ALKPHOS, BILITOT, PROT, ALBUMIN in the last 72 hours. No results for input(s): LIPASE, AMYLASE in the last 72 hours. Cardiac Enzymes No results for input(s): CKTOTAL, CKMB, CKMBINDEX, TROPONINI in the last 72 hours.  BNP: BNP (last 3 results) Recent Labs    06/11/21 1338 07/15/21 1531 08/14/21 1040  BNP 199.1* 100.1* 342.0*    ProBNP (last 3 results) No results for input(s): PROBNP in the last 8760 hours.   D-Dimer No results for input(s): DDIMER in the last 72 hours. Hemoglobin A1C Recent Labs    08/15/21 1758  HGBA1C 5.0   Fasting Lipid Panel No results for input(s): CHOL, HDL, LDLCALC, TRIG, CHOLHDL, LDLDIRECT in the last 72 hours. Thyroid Function Tests Recent Labs    08/14/21 1040  TSH 1.652    Other results:   Imaging    No results found.   Medications:     Scheduled Medications:  amiodarone  200 mg Oral Daily   apixaban  5 mg Oral BID  insulin aspart  0-9 Units Subcutaneous TID WC   insulin glargine-yfgn  12 Units Subcutaneous QHS   levothyroxine  300 mcg Oral QAC breakfast   loratadine  10 mg Oral Daily   metolazone  5 mg Oral BID   midodrine  5 mg Oral TID WC   mometasone-formoterol  2 puff Inhalation BID   potassium chloride  40 mEq Oral BID   simvastatin  5 mg Oral Daily   sodium chloride flush  3 mL Intravenous Q12H   sodium chloride flush  3 mL Intravenous Q12H    Infusions:  sodium chloride     furosemide (LASIX) 200 mg in dextrose 5% 100 mL (2mg /mL) infusion 15 mg/hr (08/17/21 0637)   milrinone 0.25 mcg/kg/min (08/17/21 0415)    PRN Medications: sodium chloride, acetaminophen **OR** acetaminophen, ipratropium-albuterol, nitroGLYCERIN, ondansetron **OR** ondansetron (ZOFRAN) IV, polyvinyl alcohol,  senna-docusate, sodium chloride, sodium chloride flush, traZODone   Assessment/Plan   1. Acute on chronic HF with mid range EF: Also with prominent RV failure.  In setting of CKD stage IV.  RHC 11/22 RA mean, PCWP mean 18, moderate to severe PAH with PVR 4 WU, CI low at 1.94. Echo this admission with EF 45-50%, mild LV dilation, mild-moderately decreased RV function with mild RVE, PASP 55.  Weight is up about 25 lbs from prior discharge.  He was admitted with worsening dyspnea and edema.  He was started on milrinone 0.25 for RV support and Lasix gtt.  JVP difficult but marked peripheral edema.  Some diuresis yesterday, no weight yet.  Creatinine mildly higher at 4.23.  - Continue milrinone 0.25 mcg/kg/min for RV support (should also lower PA pressure).   - Continue midodrine to keep BP up.  - Increase Lasix gtt to 20 mg/hr and add metolazone 5 mg bid x 2 doses.  Replace K.   - Unna boots.   2. Pulmonary hypertension:  Moderate to severe PAH with PVR 4 WU on RHC 11/22. 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 No chronic PE on recent V/Q scan.  - Not candidate for pulmonary vasodilators.  3. Atrial fibrillation: DC-CV in 11/22, started on amiodarone.  He was admitted bradycardic in 40s, concern for underlying junctional rhythm/heart block with regularized afib but baseline poor.  Currently, NSR with long 1st degree AVB in 60s.  - Bisoprolol stopped with bradycardia, leave off.  - Discussed with EP, restarted amiodarone 200 mg daily.  - Continue apixaban 5 mg bid.  4. CAD: S/p NSTEMI with occluded OM1 in 6/18, had DES to 80% stenosis mLAD at that time.  No chest pain. No coronary angiography in absence of ACS given CKD stage 4.  - Continue statin.  - No ASA given Eliquis use.  5. AKI on CKD: Stage IV. Baseline creatinine 3.  Creatinine 3.97 => 4.16 => 4.23.  Volume overloaded, diuresing with milrinone added for RV support. I am concerned that he may eventually need HD for volume management.  6. Type 2 DM:  Per primary service.   7. Chronic hypoxemic respiratory failure: He is on 3L home oxygen.  Sleep study with only mild OSA in 12/22 but with oxygen desaturation.  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  He smoked in the past but not heavily.  - Would get PFTs after diuresis   Length of Stay: 3  [name], MD  08/17/2021, 7:20 AM  Advanced Heart Failure Team Pager [phone] (M-F; 7a - 5p)  Please contact [facility] for night-coverage after hours (5p -7a ) and weekends on  [email or web address]

## 2021-08-18 ENCOUNTER — Encounter (HOSPITAL_COMMUNITY): Payer: Self-pay | Admitting: Cardiology

## 2021-08-18 ENCOUNTER — Encounter (HOSPITAL_COMMUNITY)
Admission: EM | Disposition: A | Discharge status: Home Health | Payer: Self-pay | Source: Home / Self Care | Attending: Internal Medicine

## 2021-08-18 DIAGNOSIS — I50810 Right heart failure, unspecified: Secondary | ICD-10-CM | POA: Diagnosis not present

## 2021-08-18 DIAGNOSIS — I509 Heart failure, unspecified: Secondary | ICD-10-CM | POA: Diagnosis not present

## 2021-08-18 HISTORY — PX: RIGHT HEART CATH: CATH118263

## 2021-08-18 LAB — BASIC METABOLIC PANEL
Anion gap: 12 (ref 5–15)
Anion gap: 11 (ref 5–15)
BUN: 63 mg/dL — ABNORMAL HIGH (ref 8–23)
BUN: 61 mg/dL — ABNORMAL HIGH (ref 8–23)
CO2: 23 mmol/L (ref 22–32)
CO2: 25 mmol/L (ref 22–32)
Calcium: 8.7 mg/dL — ABNORMAL LOW (ref 8.9–10.3)
Calcium: 8.8 mg/dL — ABNORMAL LOW (ref 8.9–10.3)
Chloride: 102 mmol/L (ref 98–111)
Chloride: 102 mmol/L (ref 98–111)
Creatinine, Ser: 4.07 mg/dL — ABNORMAL HIGH (ref 0.61–1.24)
Creatinine, Ser: 4.09 mg/dL — ABNORMAL HIGH (ref 0.61–1.24)
GFR, Estimated: 15 mL/min — ABNORMAL LOW (ref >60)
GFR, Estimated: 15 mL/min — ABNORMAL LOW (ref >60)
Glucose, Bld: 110 mg/dL — ABNORMAL HIGH (ref 70–99)
Glucose, Bld: 93 mg/dL (ref 70–99)
Potassium: 3.6 mmol/L (ref 3.5–5.1)
Potassium: 3.5 mmol/L (ref 3.5–5.1)
Sodium: 137 mmol/L (ref 135–145)
Sodium: 138 mmol/L (ref 135–145)

## 2021-08-18 LAB — POCT I-STAT EG7
Acid-Base Excess: 1 mmol/L (ref 0.0–2.0)
Acid-Base Excess: 2 mmol/L (ref 0.0–2.0)
Bicarbonate: 26.3 mmol/L (ref 20.0–28.0)
Bicarbonate: 27.6 mmol/L (ref 20.0–28.0)
Calcium, Ion: 1.13 mmol/L — ABNORMAL LOW (ref 1.15–1.40)
Calcium, Ion: 1.19 mmol/L (ref 1.15–1.40)
HCT: 34 % — ABNORMAL LOW (ref 39.0–52.0)
HCT: 35 % — ABNORMAL LOW (ref 39.0–52.0)
Hemoglobin: 11.6 g/dL — ABNORMAL LOW (ref 13.0–17.0)
Hemoglobin: 11.9 g/dL — ABNORMAL LOW (ref 13.0–17.0)
O2 Saturation: 74 %
O2 Saturation: 75 %
Potassium: 3.4 mmol/L — ABNORMAL LOW (ref 3.5–5.1)
Potassium: 3.6 mmol/L (ref 3.5–5.1)
Sodium: 140 mmol/L (ref 135–145)
Sodium: 142 mmol/L (ref 135–145)
TCO2: 28 mmol/L (ref 22–32)
TCO2: 29 mmol/L (ref 22–32)
pCO2, Ven: 42.3 mmHg — ABNORMAL LOW (ref 44.0–60.0)
pCO2, Ven: 44.7 mmHg (ref 44.0–60.0)
pH, Ven: 7.398 (ref 7.250–7.430)
pH, Ven: 7.402 (ref 7.250–7.430)
pO2, Ven: 40 mmHg (ref 32.0–45.0)
pO2, Ven: 40 mmHg (ref 32.0–45.0)

## 2021-08-18 LAB — GLUCOSE, CAPILLARY
Glucose-Capillary: 90 mg/dL (ref 70–99)
Glucose-Capillary: 92 mg/dL (ref 70–99)
Glucose-Capillary: 143 mg/dL — ABNORMAL HIGH (ref 70–99)
Glucose-Capillary: 114 mg/dL — ABNORMAL HIGH (ref 70–99)

## 2021-08-18 LAB — CBC
HCT: 33.6 % — ABNORMAL LOW (ref 39.0–52.0)
Hemoglobin: 11 g/dL — ABNORMAL LOW (ref 13.0–17.0)
MCH: 28.9 pg (ref 26.0–34.0)
MCHC: 32.7 g/dL (ref 30.0–36.0)
MCV: 88.2 fL (ref 80.0–100.0)
Platelets: 136 10*3/uL — ABNORMAL LOW (ref 150–400)
RBC: 3.81 MIL/uL — ABNORMAL LOW (ref 4.22–5.81)
RDW: 15.9 % — ABNORMAL HIGH (ref 11.5–15.5)
WBC: 5.9 10*3/uL (ref 4.0–10.5)
nRBC: 0 % (ref 0.0–0.2)

## 2021-08-18 SURGERY — RIGHT HEART CATH
Anesthesia: LOCAL

## 2021-08-18 MED ORDER — POTASSIUM CHLORIDE CRYS ER 20 MEQ PO TBCR
40.0000 meq | EXTENDED_RELEASE_TABLET | Freq: Two times a day (BID) | ORAL | Status: AC
  Administered 2021-08-18 (×2): 40 meq via ORAL
  Filled 2021-08-18 (×2): qty 2

## 2021-08-18 MED ORDER — LIDOCAINE HCL (PF) 1 % IJ SOLN
INTRAMUSCULAR | Status: AC
  Filled 2021-08-18: qty 30

## 2021-08-18 MED ORDER — LIDOCAINE HCL (PF) 1 % IJ SOLN
INTRAMUSCULAR | Status: DC | PRN
  Administered 2021-08-18: 2 mL

## 2021-08-18 MED ORDER — METOLAZONE 5 MG PO TABS
5.0000 mg | ORAL_TABLET | Freq: Two times a day (BID) | ORAL | Status: AC
  Administered 2021-08-18 (×2): 5 mg via ORAL
  Filled 2021-08-18 (×2): qty 1

## 2021-08-18 MED ORDER — HEPARIN (PORCINE) IN NACL 1000-0.9 UT/500ML-% IV SOLN
INTRAVENOUS | Status: AC
  Filled 2021-08-18: qty 500

## 2021-08-18 MED ORDER — ATORVASTATIN CALCIUM 10 MG PO TABS
20.0000 mg | ORAL_TABLET | Freq: Every day | ORAL | Status: DC
  Administered 2021-08-18 – 2021-08-21 (×4): 20 mg via ORAL
  Filled 2021-08-18 (×4): qty 2

## 2021-08-18 MED ORDER — HEPARIN (PORCINE) IN NACL 1000-0.9 UT/500ML-% IV SOLN
INTRAVENOUS | Status: DC | PRN
  Administered 2021-08-18: 500 mL

## 2021-08-18 MED ORDER — INSULIN GLARGINE-YFGN 100 UNIT/ML ~~LOC~~ SOLN
10.0000 [IU] | Freq: Every day | SUBCUTANEOUS | Status: DC
  Administered 2021-08-19 – 2021-08-20 (×3): 10 [IU] via SUBCUTANEOUS
  Filled 2021-08-18 (×5): qty 0.1

## 2021-08-18 SURGICAL SUPPLY — 6 items
CATH BALLN WEDGE 5F 110CM (CATHETERS) ×1 IMPLANT
GUIDEWIRE .025 260CM (WIRE) ×1 IMPLANT
KIT HEART LEFT (KITS) ×2 IMPLANT
PACK CARDIAC CATHETERIZATION (CUSTOM PROCEDURE TRAY) ×2 IMPLANT
SHEATH GLIDE SLENDER 4/5FR (SHEATH) ×1 IMPLANT
TRANSDUCER W/STOPCOCK (MISCELLANEOUS) ×2 IMPLANT

## 2021-08-18 NOTE — Progress Notes (Signed)
°  Called to request dental consultation at Dr Benson [place] office.   Left voicemail to request consult. Has had dental procedure prior to admit with sutures in place. Now complaining of pain.   Unable to receive follow up due to loss insurance  [name] Capek NP-C  1:59 PM

## 2021-08-18 NOTE — Progress Notes (Signed)
PROGRESS NOTE    [name] [name]  EZM:[id number] DOB: Jan 17, 1946 DOA: 08/14/2021 PCP: Nickola Major, MD   Chief Complaint  Patient presents with   Shortness of Breath   Leg Swelling    Brief Narrative:   [name] [name] is a 76 y.o. male with medical history significant for atrial fibrillation status post DCCV 11/18, CAD s/p stenting, CKD stage IV, pulmonary hypertension, essential hypertension, morbid obesity, non-Hodgkin's lymphoma treated with chemotherapy and radiation treatment, diabetes mellitus type 2, OSA with chronic hypoxemic respiratory failure who presented to the ED with worsening abdominal and lower extremity edema.  He is also noticing some dyspnea on exertion and claims he has gained approximately 20 pounds since discharge on 11/22.  Assessment & Plan:   Active Problems:   Type 2 diabetes mellitus with stage 4 chronic kidney disease (HCC)   Hypertension   Acute on chronic systolic and diastolic heart failure, NYHA class 1 (HCC)   Acute on chronic respiratory failure with hypoxia and hypercapnia (HCC)   AF (paroxysmal atrial fibrillation) (HCC)   CKD (chronic kidney disease) stage 4, GFR 15-29 ml/min (HCC)   Acute on chronic  systolic and diastolic CHF:  Diuresing with IV lasix gtt.  Pt started on milrinone gtt.  Pt scheduled for right heart catheterization today.  Continue with strict intake and output, daily weights. Diuresed about 6.4 lit since admission.  2 D echocardiogram reviewed showed LVEF is 45 to 50%. The left ventricle has mildly decreased function. The left ventricle demonstrates global hypokinesis. The left ventricular internal cavity size was mildly dilated. There is mild left ventricular hypertrophy. Left ventricular diastolic parameters are consistent with Grade III diastolic dysfunction (restrictive). Cardiology consulted and on board.    PAF;  Rate controlled.  S/p cardioversion,  On eliquis for anticoagulation.  Rate controlled with  amiodarone.    Acute on Stage 4 CKD;  - possibly cardio renal.  Baseline creatinine around 2.7.  Creatinine on admission was 3.97 , worsened to 4.16 to 4.23 to 4.  Nephrology consulted for recommendations. US RENAL reviewed.    Hypokalemia Replaced.    Hyperlipidemia:  Resume zocor.    Hypothyroidism  Resume synthroid.    Diabetes mellitus with hypoglycemia  Insulin dependent.  CBG (last 3)  Recent Labs    08/18/21 0843 08/18/21 1119 08/18/21 1621  GLUCAP 90 92 143*    Decrease the Semglee to 10 units from 25 units.  Hemoglobin A1c is 5.    Bradycardia:  Sinus bradycardia.    Non-Hodgkin's lymphoma  treated with chemotherapy and radiation treatment,  Body mass index is 39.91 kg/m. Obesity.  Mild anemia and thrombocytopenia; Monitor.    Chronic respiratory failure in the setting of chronic systolic and diastolic heart failure and OSA.    DVT prophylaxis: Eliquis Code Status: Full code.  Family Communication: none at bedside.  Disposition:   Status is: Inpatient  Remains inpatient appropriate because: IV diuresis.        Consultants:  EP  Cardiology.   Procedures: ECHO  Antimicrobials: none.   Subjective: No chest pain, sob improving, remains on 2 lit of [place] oxygen.   Objective: Vitals:   08/18/21 1350 08/18/21 1355 08/18/21 1400 08/18/21 1420  BP: 134/64 131/65 130/68 (!) 125/56  Pulse: 64 65 (!) 108 62  Resp: 15 17 16 16   Temp:    98 F (36.7 C)  TempSrc:    Oral  SpO2: 96% 97% 98%   Weight:  Height:        Intake/Output Summary (Last 24 hours) at 08/18/2021 1628 Last data filed at 08/18/2021 1149 Gross per 24 hour  Intake --  Output 4300 ml  Net -4300 ml    Filed Weights   08/15/21 0428 08/16/21 0559 08/18/21 0648  Weight: (!) 141 kg (!) 141.6 kg (!) 137.2 kg    Examination:  General exam: Appears calm and comfortable  Respiratory system: Clear to auscultation. Respiratory effort normal. On 2 lit of [place]  oxygen.  Cardiovascular system: S1 & S2 heard, RRR.  Gastrointestinal system: Abdomen is nondistended, soft and non tender. Normal bowel sounds heard. Central nervous system: Alert and oriented. No focal neurological deficits. Extremities: Symmetric 5 x 5 power. Skin: No rashes, lesions or ulcers Psychiatry: Judgement and insight appear normal. Mood & affect appropriate.        Data Reviewed: I have personally reviewed following labs and imaging studies  CBC: Recent Labs  Lab 08/14/21 1040 08/15/21 0332 08/18/21 0209  WBC 5.3 5.0 5.9  NEUTROABS 3.4  --   --   HGB 12.8* 11.4* 11.0*  HCT 39.1 35.5* 33.6*  MCV 88.5 88.3 88.2  PLT 148* 138* 136*     Basic Metabolic Panel: Recent Labs  Lab 08/14/21 1040 08/15/21 0332 08/16/21 0240 08/17/21 0603 08/18/21 0209 08/18/21 1212  NA 141 141 140 139 137 138  K 3.1* 3.1* 3.3* 3.5 3.6 3.5  CL 105 107 108 104 102 102  CO2 25 22 22  20* 23 25  GLUCOSE 115* 60* 88 112* 110* 93  BUN 68* 62* 64* 63* 63* 61*  CREATININE 4.06* 3.97* 4.16* 4.23* 4.07* 4.09*  CALCIUM 8.9 8.5* 8.3* 8.7* 8.7* 8.8*  MG 2.3 2.2  --   --   --   --      GFR: Estimated Creatinine Clearance: 23 mL/min (A) (by C-G formula based on SCr of 4.09 mg/dL (H)).  Liver Function Tests: No results for input(s): AST, ALT, ALKPHOS, BILITOT, PROT, ALBUMIN in the last 168 hours.  CBG: Recent Labs  Lab 08/17/21 1649 08/17/21 2232 08/18/21 0843 08/18/21 1119 08/18/21 1621  GLUCAP 124* 107* 90 92 143*      Recent Results (from the past 240 hour(s))  Resp Panel by RT-PCR (Flu A&B, Covid) Nasopharyngeal Swab     Status: None   Collection Time: 08/14/21 11:21 AM   Specimen: Nasopharyngeal Swab; Nasopharyngeal(NP) swabs in vial transport medium  Result Value Ref Range Status   SARS Coronavirus 2 by RT PCR NEGATIVE NEGATIVE Final    Comment: (NOTE) SARS-CoV-2 target nucleic acids are NOT DETECTED.  The SARS-CoV-2 RNA is generally detectable in upper  respiratory specimens during the acute phase of infection. The lowest concentration of SARS-CoV-2 viral copies this assay can detect is 138 copies/mL. A negative result does not preclude SARS-Cov-2 infection and should not be used as the sole basis for treatment or other patient management decisions. A negative result may occur with  improper specimen collection/handling, submission of specimen other than nasopharyngeal swab, presence of viral mutation(s) within the areas targeted by this assay, and inadequate number of viral copies(<138 copies/mL). A negative result must be combined with clinical observations, patient history, and epidemiological information. The expected result is Negative.  Fact Sheet for Patients:  EntrepreneurPulse.com.au  Fact Sheet for Healthcare Providers:  IncredibleEmployment.be  This test is no t yet approved or cleared by the [place] FDA and  has been authorized for detection and/or diagnosis of SARS-CoV-2 by FDA  under an Emergency Use Authorization (EUA). This EUA will remain  in effect (meaning this test can be used) for the duration of the COVID-19 declaration under Section 564(b)(1) of the Act, 21 U.S.C.section 360bbb-3(b)(1), unless the authorization is terminated  or revoked sooner.       Influenza A by PCR NEGATIVE NEGATIVE Final   Influenza B by PCR NEGATIVE NEGATIVE Final    Comment: (NOTE) The Xpert Xpress SARS-CoV-2/FLU/RSV plus assay is intended as an aid in the diagnosis of influenza from Nasopharyngeal swab specimens and should not be used as a sole basis for treatment. Nasal washings and aspirates are unacceptable for Xpert Xpress SARS-CoV-2/FLU/RSV testing.  Fact Sheet for Patients: EntrepreneurPulse.com.au  Fact Sheet for Healthcare Providers: IncredibleEmployment.be  This test is not yet approved or cleared by the [place] FDA and has been  authorized for detection and/or diagnosis of SARS-CoV-2 by FDA under an Emergency Use Authorization (EUA). This EUA will remain in effect (meaning this test can be used) for the duration of the COVID-19 declaration under Section 564(b)(1) of the Act, 21 U.S.C. section 360bbb-3(b)(1), unless the authorization is terminated or revoked.  Performed at [facility], [address] [place], [place] [address]           Radiology Studies: CARDIAC CATHETERIZATION  Result Date: 08/18/2021 1. Elevated RV filling pressure. 2. Pulmonary venous hypertension/high output PH. 3. High cardiac output in setting of milrinone use. 4. Normal PCWP.   US RENAL  Result Date: 08/17/2021 CLINICAL DATA:  Acute renal failure EXAM: RENAL / URINARY TRACT ULTRASOUND COMPLETE COMPARISON:  10/10/2020 FINDINGS: Right Kidney: Renal measurements: 10.2 x 4.2 x 5.0 cm. = volume: 112 mL. Echogenicity within normal limits. No mass or hydronephrosis visualized. Left Kidney: Renal measurements: 9.1 x 4.1 x 4.1 cm. = volume: 79 mL. Echogenicity within normal limits. No mass or hydronephrosis visualized. Bladder: Appears normal for degree of bladder distention. Other: None. IMPRESSION: Examination is somewhat limited by patient body habitus. No obstructive changes are seen. No mass lesion is noted. Electronically Signed   By: Inez Catalina M.D.   On: 08/17/2021 21:47        Scheduled Meds:  amiodarone  200 mg Oral Daily   apixaban  5 mg Oral BID   atorvastatin  20 mg Oral Daily   insulin aspart  0-9 Units Subcutaneous TID WC   insulin glargine-yfgn  10 Units Subcutaneous QHS   levothyroxine  300 mcg Oral QAC breakfast   loratadine  10 mg Oral Daily   metolazone  5 mg Oral BID   midodrine  5 mg Oral TID WC   mometasone-formoterol  2 puff Inhalation BID   potassium chloride  40 mEq Oral BID   sodium chloride flush  3 mL Intravenous Q12H   sodium chloride flush  3 mL Intravenous Q12H   Continuous Infusions:  sodium  chloride     furosemide (LASIX) 200 mg in dextrose 5% 100 mL (2mg /mL) infusion 20 mg/hr (08/18/21 0703)   milrinone 0.25 mcg/kg/min (08/18/21 1308)     LOS: 4 days        Hosie Poisson, MD [facility]   To contact the attending provider between 7A-7P or the covering provider during after hours 7P-7A, please log into the web site [email or web address] and access using universal [place] password for that web site. If you do not have the password, please call the hospital operator.  08/18/2021, 4:28 PM

## 2021-08-18 NOTE — Progress Notes (Signed)
Orthopedic Tech Progress Note Patient Details:  [name] [name] 1946-01-04 [id number]  Ortho Devices Type of Ortho Device: Haematologist Ortho Device/Splint Location: BLE Ortho Device/Splint Interventions: Ordered, Application   Post Interventions Patient Tolerated: Well Instructions Provided: Care of device  [name] 08/18/2021, 4:48 PM

## 2021-08-18 NOTE — Progress Notes (Signed)
Mobility Specialist Progress Note    08/18/21 1734  Mobility  Activity Refused mobility   Pt starting to eat dinner and said I can f/u tomorrow. Will f/u as schedule permits.   [facility] Mobility Specialist  M.S. 2C and 6E: [phone] M.S. 4E: [phone]

## 2021-08-18 NOTE — Interval H&P Note (Signed)
History and Physical Interval Note:  08/18/2021 1:32 PM  [name] [name]  has presented today for surgery, with the diagnosis of hf.  The various methods of treatment have been discussed with the patient and family. After consideration of risks, benefits and other options for treatment, the patient has consented to  Procedure(s): RIGHT HEART CATH (N/A) as a surgical intervention.  The patient's history has been reviewed, patient examined, no change in status, stable for surgery.  I have reviewed the patient's chart and labs.  Questions were answered to the patient's satisfaction.     [name] [employer]

## 2021-08-18 NOTE — Progress Notes (Addendum)
Patient ID: [name] [name], male   DOB: [birth year], 76 y.o.   MRN: [id number]     Advanced Heart Failure Rounding Note  PCP-Cardiologist: Rozann Lesches, MD   Subjective:    Remains on empiric milrinone 0.25 for RV failure. No central access for co-ox/CVP monitoring.   3.6L in UOP yesterday w/ lasix gtt + metolazone. Wt down 10 lb in the last 2 days.   Scr trending down, 4.16>>4.23>>4.07  K 3.6   UNNA boots removed earlier this morning. Planning to reapply today.   No current dyspnea. Feels ok.   Objective:   Weight Range: (!) 137.2 kg Body mass index is 39.91 kg/m.   Vital Signs:   Temp:  [98 F (36.7 C)] 98 F (36.7 C) (01/16 0648) Pulse Rate:  [64] 64 (01/16 0648) Resp:  [18] 18 (01/16 0648) BP: (112)/(49) 112/49 (01/16 0648) SpO2:  [93 %-96 %] 93 % (01/16 0648) Weight:  [137.2 kg] 137.2 kg (01/16 0648) Last BM Date: 08/17/21  Weight change: Filed Weights   08/15/21 0428 08/16/21 0559 08/18/21 0648  Weight: (!) 141 kg (!) 141.6 kg (!) 137.2 kg    Intake/Output:   Intake/Output Summary (Last 24 hours) at 08/18/2021 0745 Last data filed at 08/18/2021 7782 Gross per 24 hour  Intake 418.29 ml  Output 3600 ml  Net -3181.71 ml      Physical Exam   General:  Well appearing, obese. No respiratory difficulty HEENT: normal Neck: supple. JVD to jaw Carotids 2+ bilat; no bruits. No lymphadenopathy or thyromegaly appreciated. Cor: PMI nondisplaced. Regular rate & rhythm. No rubs, gallops or murmurs. Lungs: clear Abdomen: soft, nontender, nondistended. No hepatosplenomegaly. No bruits or masses. Good bowel sounds. Extremities: no cyanosis, clubbing, rash, 1+ b/l LE edema Neuro: alert & oriented x 3, cranial nerves grossly intact. moves all 4 extremities w/o difficulty. Affect pleasant.    Telemetry   Sinus with 1st degree AVB, 60s. Personally reviewed.   Labs    CBC Recent Labs    08/18/21 0209  WBC 5.9  HGB 11.0*  HCT 33.6*  MCV 88.2  PLT 136*    Basic Metabolic Panel Recent Labs    08/17/21 0603 08/18/21 0209  NA 139 137  K 3.5 3.6  CL 104 102  CO2 20* 23  GLUCOSE 112* 110*  BUN 63* 63*  CREATININE 4.23* 4.07*  CALCIUM 8.7* 8.7*   Liver Function Tests No results for input(s): AST, ALT, ALKPHOS, BILITOT, PROT, ALBUMIN in the last 72 hours. No results for input(s): LIPASE, AMYLASE in the last 72 hours. Cardiac Enzymes No results for input(s): CKTOTAL, CKMB, CKMBINDEX, TROPONINI in the last 72 hours.  BNP: BNP (last 3 results) Recent Labs    06/11/21 1338 07/15/21 1531 08/14/21 1040  BNP 199.1* 100.1* 342.0*    ProBNP (last 3 results) No results for input(s): PROBNP in the last 8760 hours.   D-Dimer No results for input(s): DDIMER in the last 72 hours. Hemoglobin A1C Recent Labs    08/15/21 1758  HGBA1C 5.0   Fasting Lipid Panel No results for input(s): CHOL, HDL, LDLCALC, TRIG, CHOLHDL, LDLDIRECT in the last 72 hours. Thyroid Function Tests No results for input(s): TSH, T4TOTAL, T3FREE, THYROIDAB in the last 72 hours.  Invalid input(s): FREET3   Other results:   Imaging    US RENAL  Result Date: 08/17/2021 CLINICAL DATA:  Acute renal failure EXAM: RENAL / URINARY TRACT ULTRASOUND COMPLETE COMPARISON:  10/10/2020 FINDINGS: Right Kidney: Renal measurements: 10.2 x 4.2 x  5.0 cm. = volume: 112 mL. Echogenicity within normal limits. No mass or hydronephrosis visualized. Left Kidney: Renal measurements: 9.1 x 4.1 x 4.1 cm. = volume: 79 mL. Echogenicity within normal limits. No mass or hydronephrosis visualized. Bladder: Appears normal for degree of bladder distention. Other: None. IMPRESSION: Examination is somewhat limited by patient body habitus. No obstructive changes are seen. No mass lesion is noted. Electronically Signed   By: Inez Catalina M.D.   On: 08/17/2021 21:47     Medications:     Scheduled Medications:  amiodarone  200 mg Oral Daily   apixaban  5 mg Oral BID   insulin aspart  0-9  Units Subcutaneous TID WC   insulin glargine-yfgn  12 Units Subcutaneous QHS   levothyroxine  300 mcg Oral QAC breakfast   loratadine  10 mg Oral Daily   midodrine  5 mg Oral TID WC   mometasone-formoterol  2 puff Inhalation BID   simvastatin  5 mg Oral Daily   sodium chloride flush  3 mL Intravenous Q12H   sodium chloride flush  3 mL Intravenous Q12H    Infusions:  sodium chloride     sodium chloride     sodium chloride 10 mL/hr at 08/18/21 0622   furosemide (LASIX) 200 mg in dextrose 5% 100 mL (2mg /mL) infusion 20 mg/hr (08/18/21 0703)   milrinone 0.25 mcg/kg/min (08/18/21 0203)    PRN Medications: sodium chloride, sodium chloride, acetaminophen **OR** acetaminophen, ipratropium-albuterol, nitroGLYCERIN, ondansetron **OR** ondansetron (ZOFRAN) IV, polyvinyl alcohol, senna-docusate, sodium chloride, sodium chloride flush, sodium chloride flush, traZODone   Assessment/Plan   1. Acute on chronic HF with mid range EF: Also with prominent RV failure.  In setting of CKD stage IV.  RHC 11/22 RA mean, PCWP mean 18, moderate to severe PAH with PVR 4 WU, CI low at 1.94. Echo this admission with EF 45-50%, mild LV dilation, mild-moderately decreased RV function with mild RVE, PASP 55.  Weight is up about 25 lbs from prior discharge.  He was admitted with worsening dyspnea and edema.  He was started on milrinone 0.25 for RV support and Lasix gtt.  JVP difficult but marked peripheral edema.  Diuresing w/ lasix gtt, wt trending down.  Creatinine trending down 4.23>>4.07 today.  - Continue milrinone 0.25 mcg/kg/min for RV support (should also lower PA pressure).   - Continue midodrine to keep BP up.  - Continue Lasix gtt 20 mg/hr and add metolazone 5 mg bid x 2 doses.  Replace K.   - Reapply Unna boots.   2. Pulmonary hypertension:  Moderate to severe PAH with PVR 4 WU on RHC 11/22. 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 No chronic PE on recent V/Q scan.  - Not candidate for pulmonary vasodilators.  3.  Atrial fibrillation: DC-CV in 11/22, started on amiodarone.  He was admitted bradycardic in 40s, concern for underlying junctional rhythm/heart block with regularized afib but baseline poor.  Currently, NSR with long 1st degree AVB in 60s.  - Bisoprolol stopped with bradycardia, leave off.  - Discussed with EP, restarted amiodarone 200 mg daily.  - Continue apixaban 5 mg bid.  4. CAD: S/p NSTEMI with occluded OM1 in 6/18, had DES to 80% stenosis mLAD at that time.  No chest pain. No coronary angiography in absence of ACS given CKD stage 4.  - Continue statin.  - No ASA given Eliquis use.  5. AKI on CKD: Stage IV. Baseline creatinine 3.  Creatinine 3.97 => 4.16 => 4.23=>4.07.  Volume overloaded,  diuresing with milrinone added for RV support. I am concerned that he may eventually need HD for volume management.  6. Type 2 DM: Per primary service.   7. Chronic hypoxemic respiratory failure: He is on 3L home oxygen.  Sleep study with only mild OSA in 12/22 but with oxygen desaturation.  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  He smoked in the past but not heavily.  - Would get PFTs after diuresis   Length of Stay: [address] PA-C  08/18/2021, 7:45 AM  Advanced Heart Failure Team Pager [phone] (M-F; 7a - 5p)  Please contact [facility] for night-coverage after hours (5p -7a ) and weekends on [email or web address]   Patient seen with PA, agree with the above note.   Good diuresis yesterday, I/Os net negative -3182 with weight down 10 lbs.  He still feels swollen.  Lower creatinine at 4.07.    RHC Procedural Findings (milrinone 0.25): Hemodynamics (mmHg) RA mean 11 RV 69/14 PA 66/22, mean 37 PCWP mean 13 Oxygen saturations: PA 75% AO 96% Cardiac Output (Fick) 10.85  Cardiac Index (Fick) 4.23 PVR 2.2 WU  General: NAD Neck: Thick, JVP difficult but appears around 10, no thyromegaly or thyroid nodule.  Lungs: Clear to auscultation bilaterally with normal respiratory effort. CV: Nondisplaced PMI.  Heart  regular S1/S2, no S3/S4, no murmur.  1+ chronic edema to knees.  Abdomen: Soft, nontender, no hepatosplenomegaly, no distention.  Skin: Intact without lesions or rashes.  Neurologic: Alert and oriented x 3.  Psych: Normal affect. Extremities: No clubbing or cyanosis.  HEENT: Normal.   RHC showed R>L heart failure with moderate pulmonary hypertension, suspect pulmonary venous hypertension + high output pulmonary hypertension.   - Would aggressively diurese 1 more day with Lasix gtt + metolazone.  - Start to wean milrinone and likely to po diuretics tomorrow.   [name] 08/18/2021 3:53 PM

## 2021-08-18 NOTE — Progress Notes (Signed)
[facility] Progress Note   Subjective:   I/Os yesterday 420 / 3600, labs with BUN low 60s, Cr 4.2 > 4.07.  K 3.6.  Feeling fine.  No LUTs.    Objective Vitals:   08/17/21 0823 08/17/21 0824 08/17/21 2044 08/18/21 0648  BP:    (!) 112/49  Pulse:    64  Resp:    18  Temp:    98 F (36.7 C)  TempSrc:    Oral  SpO2: 96% 96% 94% 93%  Weight:    (!) 137.2 kg  Height:       Physical Exam General: lying comfortably at 30 degrees in bed Heart: RRR, no rub Lungs: normal WOB on 2L (says on 3L chronically at home) Abdomen: soft, obese Extremities: trace LE edema Neuro:  nonfocal, conversant  Additional Objective Labs: Basic Metabolic Panel: Recent Labs  Lab 08/16/21 0240 08/17/21 0603 08/18/21 0209  NA 140 139 137  K 3.3* 3.5 3.6  CL 108 104 102  CO2 22 20* 23  GLUCOSE 88 112* 110*  BUN 64* 63* 63*  CREATININE 4.16* 4.23* 4.07*  CALCIUM 8.3* 8.7* 8.7*   Liver Function Tests: No results for input(s): AST, ALT, ALKPHOS, BILITOT, PROT, ALBUMIN in the last 168 hours. No results for input(s): LIPASE, AMYLASE in the last 168 hours. CBC: Recent Labs  Lab 08/14/21 1040 08/15/21 0332 08/18/21 0209  WBC 5.3 5.0 5.9  NEUTROABS 3.4  --   --   HGB 12.8* 11.4* 11.0*  HCT 39.1 35.5* 33.6*  MCV 88.5 88.3 88.2  PLT 148* 138* 136*   Blood Culture    Component Value Date/Time   SDES  06/24/2020 0220    IN/OUT CATH URINE Performed at [facility], [address] [place], [place] [address]    [facility]  06/24/2020 0220    NONE Performed at [facility], [address], [place], [place] [address]    [place]  06/24/2020 0220    NO GROWTH Performed at [facility] [address] [place], [place] [address]    REPTSTATUS 06/25/2020 FINAL 06/24/2020 0220    Cardiac Enzymes: No results for input(s): CKTOTAL, CKMB, CKMBINDEX, TROPONINI in the last 168 hours. CBG: Recent Labs  Lab 08/17/21 0724 08/17/21 1205 08/17/21 1649 08/17/21 2232 08/18/21 0843   GLUCAP 122* 118* 124* 107* 90   Iron Studies: No results for input(s): IRON, TIBC, TRANSFERRIN, FERRITIN in the last 72 hours. $RemoveB'@lablastinr3'DXzOyoSV$ @ Studies/Results: US RENAL  Result Date: 08/17/2021 CLINICAL DATA:  Acute renal failure EXAM: RENAL / URINARY TRACT ULTRASOUND COMPLETE COMPARISON:  10/10/2020 FINDINGS: Right Kidney: Renal measurements: 10.2 x 4.2 x 5.0 cm. = volume: 112 mL. Echogenicity within normal limits. No mass or hydronephrosis visualized. Left Kidney: Renal measurements: 9.1 x 4.1 x 4.1 cm. = volume: 79 mL. Echogenicity within normal limits. No mass or hydronephrosis visualized. Bladder: Appears normal for degree of bladder distention. Other: None. IMPRESSION: Examination is somewhat limited by patient body habitus. No obstructive changes are seen. No mass lesion is noted. Electronically Signed   By: Inez Catalina M.D.   On: 08/17/2021 21:47   Medications:  sodium chloride     sodium chloride     sodium chloride 10 mL/hr at 08/18/21 0622   furosemide (LASIX) 200 mg in dextrose 5% 100 mL ($Remov'2mg'mlvNUz$ /mL) infusion 20 mg/hr (08/18/21 0703)   milrinone 0.25 mcg/kg/min (08/18/21 0203)    amiodarone  200 mg Oral Daily   apixaban  5 mg Oral BID   insulin  aspart  0-9 Units Subcutaneous TID WC   insulin glargine-yfgn  12 Units Subcutaneous QHS   levothyroxine  300 mcg Oral QAC breakfast   loratadine  10 mg Oral Daily   metolazone  5 mg Oral BID   midodrine  5 mg Oral TID WC   mometasone-formoterol  2 puff Inhalation BID   potassium chloride  40 mEq Oral BID   simvastatin  5 mg Oral Daily   sodium chloride flush  3 mL Intravenous Q12H   sodium chloride flush  3 mL Intravenous Q12H    Assessment/ Plan: AKI on CKD - b/l creatinine 2.3- 3.3, egfr 19- 28 ml/min from late 2022. Pt admitted w/ creat 4.0 in setting of decomp d/s CHF, 20 lb wt gain w/ LE and borderline edema by CXR.  Renal US ok - limited by habitus but normal size, no obstruction.  UA not consistent with RPGN. Asked to see for  possible need for dialysis. Diuresing well currently with no indications for dialysis currently.  Cont diuresis today.  Volume overload - as above.  Follows with [employer] - will need f/u appt at d/c.  A/C combined syst/ diast CHF - getting IV milrinone and IV lasix gtt, metolazone. EF 40-45% by echo w/ dec'd RV function and pulm htn. CHF team following.   HTN - home meds on hold, BP's in 100-110s today.  Chronic hypoxic respiratory failure - on 3 L [place] baseline, admit CXR borderline edema, diuresing per above.  Anemia:  mild Hb 11.   Jannifer Hick MD 08/18/2021, 9:51 AM  [facility] Pager: [phone]

## 2021-08-19 DIAGNOSIS — I5043 Acute on chronic combined systolic (congestive) and diastolic (congestive) heart failure: Secondary | ICD-10-CM | POA: Diagnosis not present

## 2021-08-19 DIAGNOSIS — J9622 Acute and chronic respiratory failure with hypercapnia: Secondary | ICD-10-CM

## 2021-08-19 DIAGNOSIS — J9621 Acute and chronic respiratory failure with hypoxia: Secondary | ICD-10-CM | POA: Diagnosis not present

## 2021-08-19 LAB — BASIC METABOLIC PANEL
Anion gap: 13 (ref 5–15)
BUN: 64 mg/dL — ABNORMAL HIGH (ref 8–23)
CO2: 27 mmol/L (ref 22–32)
Calcium: 9 mg/dL (ref 8.9–10.3)
Chloride: 100 mmol/L (ref 98–111)
Creatinine, Ser: 4.03 mg/dL — ABNORMAL HIGH (ref 0.61–1.24)
GFR, Estimated: 15 mL/min — ABNORMAL LOW (ref >60)
Glucose, Bld: 93 mg/dL (ref 70–99)
Potassium: 3.5 mmol/L (ref 3.5–5.1)
Sodium: 140 mmol/L (ref 135–145)

## 2021-08-19 LAB — GLUCOSE, CAPILLARY
Glucose-Capillary: 103 mg/dL — ABNORMAL HIGH (ref 70–99)
Glucose-Capillary: 100 mg/dL — ABNORMAL HIGH (ref 70–99)
Glucose-Capillary: 105 mg/dL — ABNORMAL HIGH (ref 70–99)
Glucose-Capillary: 140 mg/dL — ABNORMAL HIGH (ref 70–99)

## 2021-08-19 LAB — LIPID PANEL
Cholesterol: 93 mg/dL (ref 0–200)
HDL: 26 mg/dL — ABNORMAL LOW (ref >40)
LDL Cholesterol: 50 mg/dL (ref 0–99)
Total CHOL/HDL Ratio: 3.6 RATIO
Triglycerides: 86 mg/dL (ref <150)
VLDL: 17 mg/dL (ref 0–40)

## 2021-08-19 LAB — MAGNESIUM: Magnesium: 2 mg/dL (ref 1.7–2.4)

## 2021-08-19 MED ORDER — TORSEMIDE 20 MG PO TABS
80.0000 mg | ORAL_TABLET | Freq: Every day | ORAL | Status: DC
  Administered 2021-08-19 – 2021-08-21 (×3): 80 mg via ORAL
  Filled 2021-08-19 (×3): qty 4

## 2021-08-19 MED ORDER — POTASSIUM CHLORIDE CRYS ER 20 MEQ PO TBCR
40.0000 meq | EXTENDED_RELEASE_TABLET | Freq: Once | ORAL | Status: AC
  Administered 2021-08-19: 40 meq via ORAL
  Filled 2021-08-19: qty 2

## 2021-08-19 NOTE — Progress Notes (Signed)
Physical Therapy Treatment Patient Details Name: [name] [name] MRN: [id number] DOB: 04/27/1946 Today's Date: 08/19/2021   History of Present Illness Pt is a 76 yo male returning with LE edema and SOB after recent admission 06/11/21. Upon work up pt with fluid overload and increased O2 demands. PMH includes: CHF,  afib, CAD, CKD III, COPD, HTN, HLD, LBBB, NSTEMI (2018), DM II, Non hodgkins lymphoma, sleep apnea (sleeps in recliner), morbid obesity, and neuropathy. Uses 3-4L O2 at baseline.    PT Comments    Pt slowly progressing towards all goals. Pt continues to experience DOE and drop in SpO2 into low 80swhile on 3LO2 via while ambulating. Pt was able to use urinal in sitting position in chair, independently. Suspect pt near baseline, reports using electric scooter "all the time." Acute PT to cont to follow to maintain/attempt to improve activity tolerance.    Recommendations for follow up therapy are one component of a multi-disciplinary discharge planning process, led by the attending physician.  Recommendations may be updated based on patient status, additional functional criteria and insurance authorization.  Follow Up Recommendations  Home health PT     Assistance Recommended at Discharge Intermittent Supervision/Assistance  Patient can return home with the following A little help with bathing/dressing/bathroom;Assist for transportation;Help with stairs or ramp for entrance;A little help with walking and/or transfers   Equipment Recommendations  None recommended by PT    Recommendations for Other Services       Precautions / Restrictions Precautions Precautions: Fall Precaution Comments: watch O2 Restrictions Weight Bearing Restrictions: Yes     Mobility  Bed Mobility Overal bed mobility: Needs Assistance Bed Mobility: Sit to Supine       Sit to supine: Min assist   General bed mobility comments: pt received sitting up in recliner, states "I sleep in the recliner",  minA for L LE management back up into bed    Transfers Overall transfer level: Needs assistance Equipment used: Rollator (4 wheels) Transfers: Sit to/from Stand Sit to Stand: Min assist           General transfer comment: minA to steady during transition of hands from arm rest to rollator, verbal cues to reach back for bed when returning to sit    Ambulation/Gait Ambulation/Gait assistance: Min guard Gait Distance (Feet): 30 Feet Assistive device: Rollator (4 wheels) Gait Pattern/deviations: Step-through pattern, Decreased stride length, Trunk flexed Gait velocity: slow Gait velocity interpretation: <1.31 ft/sec, indicative of household ambulator   General Gait Details: pt very dependent on UEs on rollator, unable to stand up right, reports 3/4 on DOE, SpO2 at 81-83% on 3LO2 via [place]- HF, returned to 90 s/p 90 sec of purse lipped breathing   Stairs             Wheelchair Mobility    Modified Rankin (Stroke Patients Only)       Balance Overall balance assessment: Needs assistance Sitting-balance support: No upper extremity supported, Feet supported Sitting balance-Leahy Scale: Good     Standing balance support: Single extremity supported Standing balance-Leahy Scale: Fair                              Cognition Arousal/Alertness: Awake/alert Behavior During Therapy: Flat affect Overall Cognitive Status: Within Functional Limits for tasks assessed  Exercises      General Comments General comments (skin integrity, edema, etc.): SPO2 at 81% on 3lO2 via [place] during amb, took 90 sec to return to 90% with purse lipped breathing      Pertinent Vitals/Pain Pain Assessment Pain Assessment: Faces Faces Pain Scale: Hurts little more Pain Location: L thigh Pain Descriptors / Indicators: Discomfort, Guarding, Grimacing, Sore Pain Intervention(s): Monitored during session    Home Living                           Prior Function            PT Goals (current goals can now be found in the care plan section) Progress towards PT goals: Progressing toward goals    Frequency    Min 3X/week      PT Plan Current plan remains appropriate    Co-evaluation              AM-PAC PT "6 Clicks" Mobility   Outcome Measure  Help needed turning from your back to your side while in a flat bed without using bedrails?: None Help needed moving from lying on your back to sitting on the side of a flat bed without using bedrails?: A Little Help needed moving to and from a bed to a chair (including a wheelchair)?: A Little Help needed standing up from a chair using your arms (e.g., wheelchair or bedside chair)?: A Little Help needed to walk in hospital room?: A Little Help needed climbing 3-5 steps with a railing? : A Lot 6 Click Score: 18    End of Session Equipment Utilized During Treatment: Gait belt;Oxygen Activity Tolerance: Patient limited by fatigue Patient left: with call bell/phone within reach;in bed;with bed alarm set (nutrional services present) Nurse Communication: Mobility status PT Visit Diagnosis: Muscle weakness (generalized) (M62.81);Difficulty in walking, not elsewhere classified (R26.2)     Time: 2426-8341 PT Time Calculation (min) (ACUTE ONLY): 31 min  Charges:  $Gait Training: 23-37 mins                     Kittie Plater, PT, DPT Acute Rehabilitation Services Pager #: [phone] Office #: [phone]    Berline Lopes 08/19/2021, 10:29 AM

## 2021-08-19 NOTE — Progress Notes (Signed)
[facility] Progress Note   Subjective:   I/Os yesterday 893 / 5974, labs with BUN low 60s, Cr 4.2 > 4.07>4.03.  K 3.5.  Feeling fine.  No LUTs.  Off lasix gtt, milrinone dec'd 50% this am after RHC yesterday showed volume looks good.   Objective Vitals:   08/18/21 1420 08/18/21 1947 08/19/21 0021 08/19/21 0435  BP: (!) 125/56 (!) 127/54 (!) 110/55 126/64  Pulse: 62 64 70 70  Resp: 16 15 15 18   Temp: 98 F (36.7 C) 98 F (36.7 C) 98.5 F (36.9 C) 98.2 F (36.8 C)  TempSrc: Oral Oral Oral Oral  SpO2:  96% 95% 96%  Weight:    130.8 kg  Height:       Physical Exam General: sitting in bedside chair finishing lunch Heart: RRR, no rub Lungs: normal WOB on 2L (says on 3L chronically at home) Abdomen: soft, obese Extremities: no LE edema Neuro:  nonfocal, conversant  Additional Objective Labs: Basic Metabolic Panel: Recent Labs  Lab 08/18/21 0209 08/18/21 1212 08/18/21 1356 08/18/21 1357 08/19/21 0418  NA 137 138 142 140 140  K 3.6 3.5 3.4* 3.6 3.5  CL 102 102  --   --  100  CO2 23 25  --   --  27  GLUCOSE 110* 93  --   --  93  BUN 63* 61*  --   --  64*  CREATININE 4.07* 4.09*  --   --  4.03*  CALCIUM 8.7* 8.8*  --   --  9.0    Liver Function Tests: No results for input(s): AST, ALT, ALKPHOS, BILITOT, PROT, ALBUMIN in the last 168 hours. No results for input(s): LIPASE, AMYLASE in the last 168 hours. CBC: Recent Labs  Lab 08/14/21 1040 08/15/21 0332 08/18/21 0209 08/18/21 1356 08/18/21 1357  WBC 5.3 5.0 5.9  --   --   NEUTROABS 3.4  --   --   --   --   HGB 12.8* 11.4* 11.0* 11.6* 11.9*  HCT 39.1 35.5* 33.6* 34.0* 35.0*  MCV 88.5 88.3 88.2  --   --   PLT 148* 138* 136*  --   --     Blood Culture    Component Value Date/Time   SDES  06/24/2020 0220    IN/OUT CATH URINE Performed at [facility], [address], [place], [place] [address]    [facility]  06/24/2020 0220    NONE Performed at [facility], [address]  [place], [place] [address]    [place]  06/24/2020 0220    NO GROWTH Performed at [facility], [place] [address], [place], [place] [address]    REPTSTATUS 06/25/2020 FINAL 06/24/2020 0220    Cardiac Enzymes: No results for input(s): CKTOTAL, CKMB, CKMBINDEX, TROPONINI in the last 168 hours. CBG: Recent Labs  Lab 08/18/21 0843 08/18/21 1119 08/18/21 1621 08/18/21 2112 08/19/21 0609  GLUCAP 90 92 143* 114* 103*    Iron Studies: No results for input(s): IRON, TIBC, TRANSFERRIN, FERRITIN in the last 72 hours. @lablastinr3 @ Studies/Results: CARDIAC CATHETERIZATION  Result Date: 08/18/2021 1. Elevated RV filling pressure. 2. Pulmonary venous hypertension/high output PH. 3. High cardiac output in setting of milrinone use. 4. Normal PCWP.   US RENAL  Result Date: 08/17/2021 CLINICAL DATA:  Acute renal failure EXAM: RENAL / URINARY TRACT ULTRASOUND COMPLETE COMPARISON:  10/10/2020 FINDINGS: Right Kidney: Renal measurements: 10.2 x 4.2 x 5.0 cm. = volume: 112 mL. Echogenicity within normal limits. No mass or hydronephrosis  visualized. Left Kidney: Renal measurements: 9.1 x 4.1 x 4.1 cm. = volume: 79 mL. Echogenicity within normal limits. No mass or hydronephrosis visualized. Bladder: Appears normal for degree of bladder distention. Other: None. IMPRESSION: Examination is somewhat limited by patient body habitus. No obstructive changes are seen. No mass lesion is noted. Electronically Signed   By: Inez Catalina M.D.   On: 08/17/2021 21:47   Medications:  sodium chloride     furosemide (LASIX) 200 mg in dextrose 5% 100 mL (34m/mL) infusion 20 mg/hr (08/19/21 0150)   milrinone 0.25 mcg/kg/min (08/19/21 0347)    amiodarone  200 mg Oral Daily   apixaban  5 mg Oral BID   atorvastatin  20 mg Oral Daily   insulin aspart  0-9 Units Subcutaneous TID WC   insulin glargine-yfgn  10 Units Subcutaneous QHS   levothyroxine  300 mcg Oral QAC breakfast   loratadine  10 mg Oral Daily   midodrine  5 mg Oral  TID WC   mometasone-formoterol  2 puff Inhalation BID   sodium chloride flush  3 mL Intravenous Q12H   sodium chloride flush  3 mL Intravenous Q12H    Assessment/ Plan: AKI on CKD - b/l creatinine 2.3- 3.3, egfr 19- 28 ml/min from late 2022. Pt admitted w/ creat 4.0 in setting of decomp d/s CHF, 20 lb wt gain w/ LE and borderline edema by CXR.  Renal [place] - limited by habitus but normal size, no obstruction.  UA not consistent with RPGN. Asked to see for possible need for dialysis. Diuresing well currently with no indications for dialysis currently.  Has diuresed well and volume significantly improved.  Creatinine has remained stable at 4.  Follows with [employer]- will need f/u appt at d/c - ideally within 2-4 weeks.  Patient says he already has f/u appt in that timeframe.   A/C combined syst/ diast CHF -  EF 40-45% by echo w/ dec'd RV function and pulm htn. CHF team following.  RHC showing volume good. Off lasix gtt with plans to start torsemide 80 daily tonight. Failed outpt regimen of torsemide 40/20 prior to admission.  Milrinone dec'd today as well.  HTN - home meds on hold, BP's in 100-110s today.  Chronic hypoxic respiratory failure - on 3 L [place] baseline, admit CXR borderline edema, diuresing per above.  Anemia:  mild Hb 11. No indication for ESA.   Will sign off . Call if I can help with anything.   LJannifer HickMD 08/19/2021, 8:17 AM  [facility] Pager: [phone]

## 2021-08-19 NOTE — Care Management Important Message (Signed)
Important Message  Patient Details  Name: SANTANA EDELL MRN: [id number] Date of Birth: 1945/10/21   Medicare Important Message Given:  Yes     Shelda Altes 08/19/2021, 7:53 AM

## 2021-08-19 NOTE — Plan of Care (Signed)
°  Problem: Activity: Goal: Capacity to carry out activities will improve Outcome: Progressing   Problem: Clinical Measurements: Goal: Diagnostic test results will improve Outcome: Progressing Goal: Respiratory complications will improve Outcome: Progressing Goal: Cardiovascular complication will be avoided Outcome: Progressing   Problem: Nutrition: Goal: Adequate nutrition will be maintained Outcome: Progressing   Problem: Coping: Goal: Level of anxiety will decrease Outcome: Progressing   Problem: Elimination: Goal: Will not experience complications related to bowel motility Outcome: Progressing Goal: Will not experience complications related to urinary retention Outcome: Progressing   Problem: Pain Managment: Goal: General experience of comfort will improve Outcome: Progressing   Problem: Safety: Goal: Ability to remain free from injury will improve Outcome: Progressing

## 2021-08-19 NOTE — Progress Notes (Signed)
PROGRESS NOTE    [name] [name]  TKP:[id number] DOB: Dec 11, 1945 DOA: 08/14/2021 PCP: Nickola Major, MD   Chief Complaint  Patient presents with   Shortness of Breath   Leg Swelling    Brief Narrative:   [name] [name] is a 76 y.o. male with medical history significant for atrial fibrillation status post DCCV 11/18, CAD s/p stenting, CKD stage IV, pulmonary hypertension, essential hypertension, morbid obesity, non-Hodgkin's lymphoma treated with chemotherapy and radiation treatment, diabetes mellitus type 2, OSA with chronic hypoxemic respiratory failure who presented to the ED with worsening abdominal and lower extremity edema.  He is also noticing some dyspnea on exertion and claims he has gained approximately 20 pounds since discharge on 11/22. He was admitted for acute on chronic systolic and diastolic heart failure, started on IV milrinone and IV lasix gtt. He has been appropriately diuresed. Underwent RHC and reviewed the results with the patient.   Assessment & Plan:   Active Problems:   Type 2 diabetes mellitus with stage 4 chronic kidney disease (HCC)   Hypertension   Acute on chronic systolic and diastolic heart failure, NYHA class 1 (HCC)   Acute on chronic respiratory failure with hypoxia and hypercapnia (HCC)   AF (paroxysmal atrial fibrillation) (HCC)   CKD (chronic kidney disease) stage 4, GFR 15-29 ml/min (HCC)   Acute on chronic  systolic and diastolic CHF:  Diuresed well with IV lasix gtt.  Pt started on milrinone gtt. S/p  right heart catheterization . Reviewed the results with the patient.  Continue with strict intake and output, daily weights. Diuresed about 10.8 lit since admission.  2 D echocardiogram reviewed showed LVEF is 45 to 50%. The left ventricle has mildly decreased function. The left ventricle demonstrates global hypokinesis. The left ventricular internal cavity size was mildly dilated. There is mild left ventricular hypertrophy. Left ventricular  diastolic parameters are consistent with Grade III diastolic dysfunction (restrictive). Cardiology consulted and on board.  Pt remains on 3 lit of [place] oxygen.    PAF;  Rate controlled.  S/p cardioversion,  On eliquis for anticoagulation.  Rate controlled with amiodarone 200 mg daily.    Acute on Stage 4 CKD;  - possibly cardio renal.  Baseline creatinine around 2.7.  Creatinine on admission was 3.97 , worsened to 4.16 to 4.23 to 4.  Creatinine stabilized at 4.  Nephrology consulted for recommendations.  US renal reviewed.    Hypokalemia Replaced.    Hyperlipidemia:  Resume zocor.    Hypothyroidism  Resume synthroid.    Diabetes mellitus with hypoglycemia  Insulin dependent.  CBG (last 3)  Recent Labs    08/19/21 0609 08/19/21 1114 08/19/21 1618  GLUCAP 103* 100* 105*    Decrease the Semglee to 10 units from 25 units.  Hemoglobin A1c is 5. Continue with SSI.    Bradycardia:  Sinus bradycardia.    Non-Hodgkin's lymphoma  treated with chemotherapy and radiation treatment,  Body mass index is 38.04 kg/m. Obesity.  Mild anemia and thrombocytopenia; Monitor.    Chronic respiratory failure in the setting of chronic systolic and diastolic heart failure and OSA.    DVT prophylaxis: Eliquis Code Status: Full code.  Family Communication: none at bedside.  Disposition:   Status is: Inpatient  Remains inpatient appropriate because: IV milrinone.        Consultants:  EP  Cardiology.   Procedures: ECHO  Antimicrobials: none.   Subjective: No new complaints.   Objective: Vitals:   08/19/21 0435 08/19/21 0818  08/19/21 1138 08/19/21 1622  BP: 126/64 (!) 110/54 (!) 110/56 (!) 116/53  Pulse: 70 63 61 (!) 52  Resp: 18 17 14 15   Temp: 98.2 F (36.8 C) 98 F (36.7 C) 98 F (36.7 C)   TempSrc: Oral Oral Oral   SpO2: 96% 98% 98% 97%  Weight: 130.8 kg     Height:        Intake/Output Summary (Last 24 hours) at 08/19/2021 1654 Last data  filed at 08/19/2021 1249 Gross per 24 hour  Intake 943.22 ml  Output 4105 ml  Net -3161.78 ml    Filed Weights   08/16/21 0559 08/18/21 0648 08/19/21 0435  Weight: (!) 141.6 kg (!) 137.2 kg 130.8 kg    Examination:  General exam: Appears calm and comfortable  Respiratory system: diminished at bases, on 3 lit of [place] oxygen.  Cardiovascular system: S1 & S2 heard, RRR.  No pedal edema. Gastrointestinal system: Abdomen is nondistended, soft and nontender.  Normal bowel sounds heard. Central nervous system: Alert and oriented. No focal neurological deficits. Extremities: Symmetric 5 x 5 power. Skin: No rashes, lesions or ulcers Psychiatry: Mood & affect appropriate.         Data Reviewed: I have personally reviewed following labs and imaging studies  CBC: Recent Labs  Lab 08/14/21 1040 08/15/21 0332 08/18/21 0209 08/18/21 1356 08/18/21 1357  WBC 5.3 5.0 5.9  --   --   NEUTROABS 3.4  --   --   --   --   HGB 12.8* 11.4* 11.0* 11.6* 11.9*  HCT 39.1 35.5* 33.6* 34.0* 35.0*  MCV 88.5 88.3 88.2  --   --   PLT 148* 138* 136*  --   --      Basic Metabolic Panel: Recent Labs  Lab 08/14/21 1040 08/15/21 0332 08/16/21 0240 08/17/21 0603 08/18/21 0209 08/18/21 1212 08/18/21 1356 08/18/21 1357 08/19/21 0418  NA 141 141 140 139 137 138 142 140 140  K 3.1* 3.1* 3.3* 3.5 3.6 3.5 3.4* 3.6 3.5  CL 105 107 108 104 102 102  --   --  100  CO2 25 22 22  20* 23 25  --   --  27  GLUCOSE 115* 60* 88 112* 110* 93  --   --  93  BUN 68* 62* 64* 63* 63* 61*  --   --  64*  CREATININE 4.06* 3.97* 4.16* 4.23* 4.07* 4.09*  --   --  4.03*  CALCIUM 8.9 8.5* 8.3* 8.7* 8.7* 8.8*  --   --  9.0  MG 2.3 2.2  --   --   --   --   --   --  2.0     GFR: Estimated Creatinine Clearance: 22.8 mL/min (A) (by C-G formula based on SCr of 4.03 mg/dL (H)).  Liver Function Tests: No results for input(s): AST, ALT, ALKPHOS, BILITOT, PROT, ALBUMIN in the last 168 hours.  CBG: Recent Labs  Lab  08/18/21 1621 08/18/21 2112 08/19/21 0609 08/19/21 1114 08/19/21 1618  GLUCAP 143* 114* 103* 100* 105*      Recent Results (from the past 240 hour(s))  Resp Panel by RT-PCR (Flu A&B, Covid) Nasopharyngeal Swab     Status: None   Collection Time: 08/14/21 11:21 AM   Specimen: Nasopharyngeal Swab; Nasopharyngeal(NP) swabs in vial transport medium  Result Value Ref Range Status   SARS Coronavirus 2 by RT PCR NEGATIVE NEGATIVE Final    Comment: (NOTE) SARS-CoV-2 target nucleic acids are NOT DETECTED.  The  SARS-CoV-2 RNA is generally detectable in upper respiratory specimens during the acute phase of infection. The lowest concentration of SARS-CoV-2 viral copies this assay can detect is 138 copies/mL. A negative result does not preclude SARS-Cov-2 infection and should not be used as the sole basis for treatment or other patient management decisions. A negative result may occur with  improper specimen collection/handling, submission of specimen other than nasopharyngeal swab, presence of viral mutation(s) within the areas targeted by this assay, and inadequate number of viral copies(<138 copies/mL). A negative result must be combined with clinical observations, patient history, and epidemiological information. The expected result is Negative.  Fact Sheet for Patients:  EntrepreneurPulse.com.au  Fact Sheet for Healthcare Providers:  IncredibleEmployment.be  This test is no t yet approved or cleared by the [place] FDA and  has been authorized for detection and/or diagnosis of SARS-CoV-2 by FDA under an Emergency Use Authorization (EUA). This EUA will remain  in effect (meaning this test can be used) for the duration of the COVID-19 declaration under Section 564(b)(1) of the Act, 21 U.S.C.section 360bbb-3(b)(1), unless the authorization is terminated  or revoked sooner.       Influenza A by PCR NEGATIVE NEGATIVE Final   Influenza B by  PCR NEGATIVE NEGATIVE Final    Comment: (NOTE) The Xpert Xpress SARS-CoV-2/FLU/RSV plus assay is intended as an aid in the diagnosis of influenza from Nasopharyngeal swab specimens and should not be used as a sole basis for treatment. Nasal washings and aspirates are unacceptable for Xpert Xpress SARS-CoV-2/FLU/RSV testing.  Fact Sheet for Patients: EntrepreneurPulse.com.au  Fact Sheet for Healthcare Providers: IncredibleEmployment.be  This test is not yet approved or cleared by the [place] FDA and has been authorized for detection and/or diagnosis of SARS-CoV-2 by FDA under an Emergency Use Authorization (EUA). This EUA will remain in effect (meaning this test can be used) for the duration of the COVID-19 declaration under Section 564(b)(1) of the Act, 21 U.S.C. section 360bbb-3(b)(1), unless the authorization is terminated or revoked.  Performed at [facility], [address] [place], [place] [address]           Radiology Studies: CARDIAC CATHETERIZATION  Result Date: 08/18/2021 1. Elevated RV filling pressure. 2. Pulmonary venous hypertension/high output PH. 3. High cardiac output in setting of milrinone use. 4. Normal PCWP.   US RENAL  Result Date: 08/17/2021 CLINICAL DATA:  Acute renal failure EXAM: RENAL / URINARY TRACT ULTRASOUND COMPLETE COMPARISON:  10/10/2020 FINDINGS: Right Kidney: Renal measurements: 10.2 x 4.2 x 5.0 cm. = volume: 112 mL. Echogenicity within normal limits. No mass or hydronephrosis visualized. Left Kidney: Renal measurements: 9.1 x 4.1 x 4.1 cm. = volume: 79 mL. Echogenicity within normal limits. No mass or hydronephrosis visualized. Bladder: Appears normal for degree of bladder distention. Other: None. IMPRESSION: Examination is somewhat limited by patient body habitus. No obstructive changes are seen. No mass lesion is noted. Electronically Signed   By: Inez Catalina M.D.   On: 08/17/2021 21:47         Scheduled Meds:  amiodarone  200 mg Oral Daily   apixaban  5 mg Oral BID   atorvastatin  20 mg Oral Daily   insulin aspart  0-9 Units Subcutaneous TID WC   insulin glargine-yfgn  10 Units Subcutaneous QHS   levothyroxine  300 mcg Oral QAC breakfast   loratadine  10 mg Oral Daily   midodrine  5 mg Oral TID WC   mometasone-formoterol  2 puff Inhalation BID  sodium chloride flush  3 mL Intravenous Q12H   sodium chloride flush  3 mL Intravenous Q12H   torsemide  80 mg Oral Daily   Continuous Infusions:  sodium chloride     milrinone 0.125 mcg/kg/min (08/19/21 0944)     LOS: 5 days        Hosie Poisson, MD [facility]   To contact the attending provider between 7A-7P or the covering provider during after hours 7P-7A, please log into the web site [email or web address] and access using universal [place] password for that web site. If you do not have the password, please call the hospital operator.  08/19/2021, 4:54 PM

## 2021-08-19 NOTE — Plan of Care (Signed)
°  Problem: Education: Goal: Ability to demonstrate management of disease process will improve Outcome: Progressing   Problem: Activity: Goal: Capacity to carry out activities will improve Outcome: Progressing   Problem: Clinical Measurements: Goal: Diagnostic test results will improve Outcome: Progressing

## 2021-08-19 NOTE — Progress Notes (Signed)
Patient ID: [name] [name], male   DOB: 1945-09-26, 76 y.o.   MRN: [id number]      Advanced Heart Failure Rounding Note  PCP-Cardiologist: Rozann Lesches, MD   Subjective:    Remains on empiric milrinone 0.25 for RV failure.   Good UOP yesterday, weight down significantly.    Creatinine stable, 4.16>>4.23>>4.07>>4.03.  K 3.5   No current dyspnea. Feels ok.   RHC Procedural Findings (milrinone 0.25): Hemodynamics (mmHg) RA mean 11 RV 69/14 PA 66/22, mean 37 PCWP mean 13 Oxygen saturations: PA 75% AO 96% Cardiac Output (Fick) 10.85  Cardiac Index (Fick) 4.23 PVR 2.2 WU  Objective:   Weight Range: 130.8 kg Body mass index is 38.04 kg/m.   Vital Signs:   Temp:  [97.9 F (36.6 C)-98.5 F (36.9 C)] 98 F (36.7 C) (01/17 0818) Pulse Rate:  [0-108] 63 (01/17 0818) Resp:  [14-18] 17 (01/17 0818) BP: (110-137)/(54-68) 110/54 (01/17 0818) SpO2:  [94 %-98 %] 98 % (01/17 0818) FiO2 (%):  [32 %] 32 % (01/16 1947) Weight:  [130.8 kg] 130.8 kg (01/17 0435) Last BM Date: 08/18/21  Weight change: Filed Weights   08/16/21 0559 08/18/21 0648 08/19/21 0435  Weight: (!) 141.6 kg (!) 137.2 kg 130.8 kg    Intake/Output:   Intake/Output Summary (Last 24 hours) at 08/19/2021 0936 Last data filed at 08/19/2021 0900 Gross per 24 hour  Intake 893.22 ml  Output 5680 ml  Net -4786.78 ml      Physical Exam   General: NAD Neck: No JVD, no thyromegaly or thyroid nodule.  Lungs: Clear to auscultation bilaterally with normal respiratory effort. CV: Nondisplaced PMI.  Heart regular S1/S2, no S3/S4, no murmur.  Trace ankle edema.  Abdomen: Soft, nontender, no hepatosplenomegaly, no distention.  Skin: Intact without lesions or rashes.  Neurologic: Alert and oriented x 3.  Psych: Normal affect. Extremities: No clubbing or cyanosis.  HEENT: Normal.    Telemetry   Sinus with 1st degree AVB, 60s. Personally reviewed.   Labs    CBC Recent Labs    08/18/21 0209  08/18/21 1356 08/18/21 1357  WBC 5.9  --   --   HGB 11.0* 11.6* 11.9*  HCT 33.6* 34.0* 35.0*  MCV 88.2  --   --   PLT 136*  --   --    Basic Metabolic Panel Recent Labs    08/18/21 1212 08/18/21 1356 08/18/21 1357 08/19/21 0418  NA 138   < > 140 140  K 3.5   < > 3.6 3.5  CL 102  --   --  100  CO2 25  --   --  27  GLUCOSE 93  --   --  93  BUN 61*  --   --  64*  CREATININE 4.09*  --   --  4.03*  CALCIUM 8.8*  --   --  9.0  MG  --   --   --  2.0   < > = values in this interval not displayed.   Liver Function Tests No results for input(s): AST, ALT, ALKPHOS, BILITOT, PROT, ALBUMIN in the last 72 hours. No results for input(s): LIPASE, AMYLASE in the last 72 hours. Cardiac Enzymes No results for input(s): CKTOTAL, CKMB, CKMBINDEX, TROPONINI in the last 72 hours.  BNP: BNP (last 3 results) Recent Labs    06/11/21 1338 07/15/21 1531 08/14/21 1040  BNP 199.1* 100.1* 342.0*    ProBNP (last 3 results) No results for input(s): PROBNP in the last  8760 hours.   D-Dimer No results for input(s): DDIMER in the last 72 hours. Hemoglobin A1C No results for input(s): HGBA1C in the last 72 hours.  Fasting Lipid Panel Recent Labs    08/19/21 0418  CHOL 93  HDL 26*  LDLCALC 50  TRIG 86  CHOLHDL 3.6   Thyroid Function Tests No results for input(s): TSH, T4TOTAL, T3FREE, THYROIDAB in the last 72 hours.  Invalid input(s): FREET3   Other results:   Imaging    CARDIAC CATHETERIZATION  Result Date: 08/18/2021 1. Elevated RV filling pressure. 2. Pulmonary venous hypertension/high output PH. 3. High cardiac output in setting of milrinone use. 4. Normal PCWP.     Medications:     Scheduled Medications:  amiodarone  200 mg Oral Daily   apixaban  5 mg Oral BID   atorvastatin  20 mg Oral Daily   insulin aspart  0-9 Units Subcutaneous TID WC   insulin glargine-yfgn  10 Units Subcutaneous QHS   levothyroxine  300 mcg Oral QAC breakfast   loratadine  10 mg Oral  Daily   midodrine  5 mg Oral TID WC   mometasone-formoterol  2 puff Inhalation BID   sodium chloride flush  3 mL Intravenous Q12H   sodium chloride flush  3 mL Intravenous Q12H   torsemide  80 mg Oral Daily    Infusions:  sodium chloride     milrinone 0.25 mcg/kg/min (08/19/21 0347)    PRN Medications: sodium chloride, acetaminophen **OR** acetaminophen, ipratropium-albuterol, nitroGLYCERIN, ondansetron **OR** ondansetron (ZOFRAN) IV, polyvinyl alcohol, senna-docusate, sodium chloride, sodium chloride flush, traZODone   Assessment/Plan   1. Acute on chronic HF with mid range EF: Also with prominent RV failure.  In setting of CKD stage IV.  RHC 11/22 RA mean, PCWP mean 18, moderate to severe PAH with PVR 4 WU, CI low at 1.94. Echo this admission with EF 45-50%, mild LV dilation, mild-moderately decreased RV function with mild RVE, PASP 55.  Weight is up about 25 lbs from prior discharge.  He was admitted with worsening dyspnea and edema.  He was started on milrinone 0.25 for RV support and Lasix gtt.  Good diuresis with Lasix gtt + metolazone.  Weight down significantly.  RHC with near-optimized filling pressures on 1/16. Creatinine stable at 4.03.   - Stop Lasix gtt today, start torsemide 80 mg daily this evening.   - Decrease milrinone to 0.125 mcg/kg/min.  - Continue midodrine to keep BP up.  - Unna boots.   2. Pulmonary hypertension:  Moderate to severe PAH with PVR 4 WU on RHC 11/22. 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 No chronic PE on recent V/Q scan. [place] 08/18/21 with moderate PH but low PVR at 2.2 and high CO, suspect pulmonary venous hypertension + high output PH.  - Not candidate for pulmonary vasodilators.  3. Atrial fibrillation: DC-CV in 11/22, started on amiodarone.  He was admitted bradycardic in 40s, concern for underlying junctional rhythm/heart block with regularized afib but baseline poor.  Currently, NSR with long 1st degree AVB in 60s.  - Bisoprolol stopped with  bradycardia, leave off.  - Discussed with EP, restarted amiodarone 200 mg daily.  - Continue apixaban 5 mg bid.  4. CAD: S/p NSTEMI with occluded OM1 in 6/18, had DES to 80% stenosis mLAD at that time.  No chest pain. No coronary angiography in absence of ACS given CKD stage 4.  - Continue statin.  - No ASA given Eliquis use.  5. AKI on CKD: Stage  IV. Baseline creatinine 3.  Creatinine 3.97 => 4.16 => 4.23=>4.07>4.03. Volume status improved. I am concerned that he may eventually need HD for volume management.  6. Type 2 DM: Per primary service.   7. Chronic hypoxemic respiratory failure: He is on 3L home oxygen.  Sleep study with only mild OSA in 12/22 but with oxygen desaturation.  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  He smoked in the past but not heavily.  - Will get PFTs now that he has been diuresed.   Walk in halls  Length of Stay: [place], MD  08/19/2021, 9:36 AM  Advanced Heart Failure Team Pager [phone] (M-F; 7a - 5p)  Please contact [facility] for night-coverage after hours (5p -7a ) and weekends on [email or web address]

## 2021-08-19 NOTE — Progress Notes (Signed)
Mobility Specialist Progress Note: ° ° 08/19/21 1214  °Mobility  °Activity Ambulated with assistance in hallway  °Level of Assistance Standby assist, set-up cues, supervision of patient - no hands on  °Assistive Device Front wheel walker  °Distance Ambulated (ft) 40 ft  °Activity Response Tolerated well  °$Mobility charge 1 Mobility  ° °Pt received in bed willing to participate in mobility. Complaints of leg pain which limited distance. Pt left in chair with call bell in reach and all needs met.  ° °  °Mobility Specialist °Primary Phone [phone] °Secondary Phone [phone] ° °

## 2021-08-20 ENCOUNTER — Other Ambulatory Visit (HOSPITAL_COMMUNITY): Payer: Self-pay | Admitting: Respiratory Therapy

## 2021-08-20 ENCOUNTER — Inpatient Hospital Stay (HOSPITAL_COMMUNITY): Payer: Medicare HMO

## 2021-08-20 DIAGNOSIS — J9622 Acute and chronic respiratory failure with hypercapnia: Secondary | ICD-10-CM | POA: Diagnosis not present

## 2021-08-20 DIAGNOSIS — J9621 Acute and chronic respiratory failure with hypoxia: Secondary | ICD-10-CM | POA: Diagnosis not present

## 2021-08-20 DIAGNOSIS — I5033 Acute on chronic diastolic (congestive) heart failure: Secondary | ICD-10-CM | POA: Diagnosis not present

## 2021-08-20 LAB — GLUCOSE, CAPILLARY
Glucose-Capillary: 119 mg/dL — ABNORMAL HIGH (ref 70–99)
Glucose-Capillary: 97 mg/dL (ref 70–99)
Glucose-Capillary: 135 mg/dL — ABNORMAL HIGH (ref 70–99)
Glucose-Capillary: 143 mg/dL — ABNORMAL HIGH (ref 70–99)

## 2021-08-20 LAB — PULMONARY FUNCTION TEST
DL/VA % pred: 48 %
DL/VA: 1.9 ml/min/mmHg/L
DLCO cor % pred: 29 %
DLCO cor: 8.17 ml/min/mmHg
DLCO unc % pred: 26 %
DLCO unc: 7.2 ml/min/mmHg
FEF 25-75 Post: 3.37 L/sec
FEF 25-75 Pre: 2.66 L/sec
FEF2575-%Change-Post: 26 %
FEF2575-%Pred-Post: 131 %
FEF2575-%Pred-Pre: 103 %
FEV1-%Change-Post: 6 %
FEV1-%Pred-Post: 74 %
FEV1-%Pred-Pre: 69 %
FEV1-Post: 2.59 L
FEV1-Pre: 2.44 L
FEV1FVC-%Change-Post: 2 %
FEV1FVC-%Pred-Pre: 112 %
FEV6-%Change-Post: 3 %
FEV6-%Pred-Post: 67 %
FEV6-%Pred-Pre: 65 %
FEV6-Post: 3.07 L
FEV6-Pre: 2.98 L
FEV6FVC-%Pred-Post: 105 %
FEV6FVC-%Pred-Pre: 105 %
FVC-%Change-Post: 3 %
FVC-%Pred-Post: 64 %
FVC-%Pred-Pre: 62 %
FVC-Post: 3.07 L
FVC-Pre: 2.98 L
Post FEV1/FVC ratio: 84 %
Post FEV6/FVC ratio: 100 %
Pre FEV1/FVC ratio: 82 %
Pre FEV6/FVC Ratio: 100 %
RV % pred: 9 %
RV: 0.26 L
TLC % pred: 45 %
TLC: 3.49 L

## 2021-08-20 LAB — BASIC METABOLIC PANEL
Anion gap: 12 (ref 5–15)
BUN: 65 mg/dL — ABNORMAL HIGH (ref 8–23)
CO2: 29 mmol/L (ref 22–32)
Calcium: 9 mg/dL (ref 8.9–10.3)
Chloride: 96 mmol/L — ABNORMAL LOW (ref 98–111)
Creatinine, Ser: 3.92 mg/dL — ABNORMAL HIGH (ref 0.61–1.24)
GFR, Estimated: 15 mL/min — ABNORMAL LOW (ref >60)
Glucose, Bld: 96 mg/dL (ref 70–99)
Potassium: 3.3 mmol/L — ABNORMAL LOW (ref 3.5–5.1)
Sodium: 137 mmol/L (ref 135–145)

## 2021-08-20 MED ORDER — SIMETHICONE 80 MG PO CHEW
80.0000 mg | CHEWABLE_TABLET | Freq: Once | ORAL | Status: AC
  Administered 2021-08-20: 80 mg via ORAL
  Filled 2021-08-20: qty 1

## 2021-08-20 MED ORDER — LOPERAMIDE HCL 2 MG PO CAPS
2.0000 mg | ORAL_CAPSULE | Freq: Once | ORAL | Status: AC | PRN
  Administered 2021-08-20: 2 mg via ORAL
  Filled 2021-08-20: qty 1

## 2021-08-20 MED ORDER — POTASSIUM CHLORIDE CRYS ER 20 MEQ PO TBCR
40.0000 meq | EXTENDED_RELEASE_TABLET | Freq: Once | ORAL | Status: AC
  Administered 2021-08-20: 40 meq via ORAL
  Filled 2021-08-20: qty 2

## 2021-08-20 MED ORDER — ALBUTEROL SULFATE (2.5 MG/3ML) 0.083% IN NEBU
2.5000 mg | INHALATION_SOLUTION | Freq: Once | RESPIRATORY_TRACT | Status: DC
  Filled 2021-08-20: qty 3

## 2021-08-20 MED ORDER — LOPERAMIDE HCL 2 MG PO CAPS
2.0000 mg | ORAL_CAPSULE | Freq: Once | ORAL | Status: AC
  Administered 2021-08-20: 2 mg via ORAL
  Filled 2021-08-20: qty 1

## 2021-08-20 NOTE — TOC Progression Note (Addendum)
Transition of Care [facility]) - Progression Note    Patient Details  Name: [name] [name] MRN: [id number] Date of Birth: 09-01-1945  Transition of Care [place]) CM/SW Contact  Akari Defelice, LCSW Phone Number: 08/20/2021, 1:07 PM  Clinical Narrative:    HF CSW messaged the RNCM, Neoma Laming to see about doing a benefits check for insurance coverage and if there is any way to check on a supplement for dental coverage. He will need a Dental follow up and it is unclear if he has dental coverage.  RNCM Neoma Laming reported [name] does have dental coverage. Dr. Benson Norway to see [name] tomorrow.   Expected Discharge Plan: [place] Barriers to Discharge: Continued Medical Work up  Expected Discharge Plan and Services Expected Discharge Plan: [place] In-house Referral: Clinical Social Work Discharge Planning Services: CM Consult Post Acute Care Choice: Home Health, Resumption of Svcs/PTA Provider Living arrangements for the past 2 months: Single Family Home                 DME Arranged: N/A DME Agency: NA       HH Arranged: RN, Disease Management, PT [place] Agency: [place] Date [place]: 08/16/21 Time [place]: [id number] Representative spoke with at [place]: [place] ([place]) Interventions    Readmission Risk Interventions Readmission Risk Prevention Plan 08/16/2021 01/31/2021 12/29/2020  Transportation Screening Complete Complete Complete  Medication Review Press photographer) Complete - Complete  PCP or Specialist appointment within 3-5 days of discharge Complete - Complete  HRI or Home Care Consult Complete Complete Complete  SW Recovery Care/Counseling Consult Complete Complete Complete  Palliative Care Screening Not Applicable Complete Not [place] Not Applicable Complete Complete  Some recent data might be hidden

## 2021-08-20 NOTE — Plan of Care (Signed)
°  Problem: Cardiac: Goal: Ability to achieve and maintain adequate cardiopulmonary perfusion will improve Outcome: Progressing   Problem: Education: Goal: Knowledge of General Education information will improve Description: Including pain rating scale, medication(s)/side effects and non-pharmacologic comfort measures Outcome: Progressing   Problem: Clinical Measurements: Goal: Ability to maintain clinical measurements within normal limits will improve Outcome: Progressing

## 2021-08-20 NOTE — Plan of Care (Signed)
°  Problem: Activity: Goal: Capacity to carry out activities will improve Outcome: Progressing   Problem: Cardiac: Goal: Ability to achieve and maintain adequate cardiopulmonary perfusion will improve Outcome: Progressing   Problem: Health Behavior/Discharge Planning: Goal: Ability to manage health-related needs will improve Outcome: Progressing   Problem: Clinical Measurements: Goal: Diagnostic test results will improve Outcome: Progressing Goal: Respiratory complications will improve Outcome: Progressing Goal: Cardiovascular complication will be avoided Outcome: Progressing   Problem: Activity: Goal: Risk for activity intolerance will decrease Outcome: Progressing   Problem: Coping: Goal: Level of anxiety will decrease Outcome: Progressing   Problem: Elimination: Goal: Will not experience complications related to urinary retention Outcome: Progressing   Problem: Pain Managment: Goal: General experience of comfort will improve Outcome: Progressing   Problem: Safety: Goal: Ability to remain free from injury will improve Outcome: Progressing

## 2021-08-20 NOTE — Progress Notes (Addendum)
Patient ID: [name] [name], male   DOB: [birth year], 76 y.o.   MRN: [id number]      Advanced Heart Failure Rounding Note  PCP-Cardiologist: Rozann Lesches, MD   Subjective:    01/17 Milrinone decreased to 0.125   Creatinine stable, 4.16>>4.23>>4.07>>4.03>>3.92.  K 3.2  Now on po Torsemide. -3.7L yesterday + unmeasured void. Down total of 32 lb this admit.  BP stable.  Ambulated about 40 feet in hall yesterday with walker. No dyspnea or CP.   Only complaint is dental pain.  RHC Procedural Findings (milrinone 0.25): Hemodynamics (mmHg) RA mean 11 RV 69/14 PA 66/22, mean 37 PCWP mean 13 Oxygen saturations: PA 75% AO 96% Cardiac Output (Fick) 10.85  Cardiac Index (Fick) 4.23 PVR 2.2 WU  Objective:   Weight Range: 127.2 kg Body mass index is 36.99 kg/m.   Vital Signs:   Temp:  [97.9 F (36.6 C)-98.5 F (36.9 C)] 97.9 F (36.6 C) (01/18 0727) Pulse Rate:  [52-63] 61 (01/18 0727) Resp:  [13-19] 13 (01/18 0727) BP: (108-127)/(51-60) 108/55 (01/18 0727) SpO2:  [92 %-98 %] 92 % (01/18 0727) Weight:  [127.2 kg] 127.2 kg (01/18 0434) Last BM Date: 08/18/20  Weight change: Filed Weights   08/18/21 0648 08/19/21 0435 08/20/21 0434  Weight: (!) 137.2 kg 130.8 kg 127.2 kg    Intake/Output:   Intake/Output Summary (Last 24 hours) at 08/20/2021 0806 Last data filed at 08/20/2021 0700 Gross per 24 hour  Intake 890 ml  Output 4600 ml  Net -3710 ml      Physical Exam   General:  No distress.  HEENT: normal Neck: supple. no JVD sitting upright. Carotids 2+ bilat; no bruits. No lymphadenopathy or thryomegaly appreciated. Cor: PMI nondisplaced. Regular rate & rhythm. No rubs, gallops or murmurs. Lungs: clear Abdomen: soft, nontender, nondistended. No hepatosplenomegaly. No bruits or masses. Good bowel sounds. Extremities: no cyanosis, clubbing, rash, UNNA on Neuro: alert & orientedx3, cranial nerves grossly intact. moves all 4 extremities w/o difficulty. Affect  pleasant    Telemetry   SR 50s-60s, up to 5 PVCs/min (personally reviewed)  Labs    CBC Recent Labs    08/18/21 0209 08/18/21 1356 08/18/21 1357  WBC 5.9  --   --   HGB 11.0* 11.6* 11.9*  HCT 33.6* 34.0* 35.0*  MCV 88.2  --   --   PLT 136*  --   --    Basic Metabolic Panel Recent Labs    08/19/21 0418 08/20/21 0457  NA 140 137  K 3.5 3.3*  CL 100 96*  CO2 27 29  GLUCOSE 93 96  BUN 64* 65*  CREATININE 4.03* 3.92*  CALCIUM 9.0 9.0  MG 2.0  --    Liver Function Tests No results for input(s): AST, ALT, ALKPHOS, BILITOT, PROT, ALBUMIN in the last 72 hours. No results for input(s): LIPASE, AMYLASE in the last 72 hours. Cardiac Enzymes No results for input(s): CKTOTAL, CKMB, CKMBINDEX, TROPONINI in the last 72 hours.  BNP: BNP (last 3 results) Recent Labs    06/11/21 1338 07/15/21 1531 08/14/21 1040  BNP 199.1* 100.1* 342.0*    ProBNP (last 3 results) No results for input(s): PROBNP in the last 8760 hours.   D-Dimer No results for input(s): DDIMER in the last 72 hours. Hemoglobin A1C No results for input(s): HGBA1C in the last 72 hours.  Fasting Lipid Panel Recent Labs    08/19/21 0418  CHOL 93  HDL 26*  LDLCALC 50  TRIG 86  CHOLHDL 3.6  Thyroid Function Tests No results for input(s): TSH, T4TOTAL, T3FREE, THYROIDAB in the last 72 hours.  Invalid input(s): FREET3   Other results:   Imaging    No results found.   Medications:     Scheduled Medications:  amiodarone  200 mg Oral Daily   apixaban  5 mg Oral BID   atorvastatin  20 mg Oral Daily   insulin aspart  0-9 Units Subcutaneous TID WC   insulin glargine-yfgn  10 Units Subcutaneous QHS   levothyroxine  300 mcg Oral QAC breakfast   loratadine  10 mg Oral Daily   midodrine  5 mg Oral TID WC   mometasone-formoterol  2 puff Inhalation BID   sodium chloride flush  3 mL Intravenous Q12H   sodium chloride flush  3 mL Intravenous Q12H   torsemide  80 mg Oral Daily     Infusions:  sodium chloride     milrinone 0.125 mcg/kg/min (08/20/21 0739)    PRN Medications: sodium chloride, acetaminophen **OR** acetaminophen, ipratropium-albuterol, nitroGLYCERIN, ondansetron **OR** ondansetron (ZOFRAN) IV, polyvinyl alcohol, senna-docusate, sodium chloride, sodium chloride flush, traZODone   Assessment/Plan   1. Acute on chronic HF with mid range EF: Also with prominent RV failure.  In setting of CKD stage IV.  RHC 11/22 RA mean, PCWP mean 18, moderate to severe PAH with PVR 4 WU, CI low at 1.94. Echo this admission with EF 45-50%, mild LV dilation, mild-moderately decreased RV function with mild RVE, PASP 55.  Weight is up about 25 lbs from prior discharge.  He was admitted with worsening dyspnea and edema.  He was started on milrinone 0.25 for RV support and Lasix gtt.  Good diuresis with Lasix gtt + metolazone.  Weight down 30 lb.  RHC with near-optimized filling pressures on 1/16.  - Continues on milrinone 0.125 >> will stop - Now on 80 mg Torsemide daily. Scr stable at 3.92  K 3.3. Supp today. - Continue midodrine to keep BP up.  - Unna boots.   2. Pulmonary hypertension:  Moderate to severe PAH with PVR 4 WU on RHC 11/22. 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 No chronic PE on recent V/Q scan. 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  - Not candidate for pulmonary vasodilators.  3. Atrial fibrillation: DC-CV in 11/22, started on amiodarone.  He was admitted bradycardic in 40s, concern for underlying junctional rhythm/heart block with regularized afib but baseline poor.  Currently, SR 50s-60s - Bisoprolol stopped with bradycardia, leave off.  - Discussed with EP, restarted amiodarone 200 mg daily.  - Continue apixaban 5 mg bid.  4. CAD: S/p NSTEMI with occluded OM1 in 6/18, had DES to 80% stenosis mLAD at that time.  No chest pain. No coronary angiography in absence of ACS given CKD stage  4.  - Continue statin.  - No ASA given Eliquis use.  5. AKI on CKD: Stage IV. Baseline creatinine 3.  Creatinine 3.97>4.16>4.23>4.07>4.03> 3.92. Volume status improved. I am concerned that he may eventually need HD for volume management.  - Nephrology on board - Plans to f/u with Nephrologist in [place] at discharge 6. Type 2 DM: Per primary service.   7. Chronic hypoxemic respiratory failure: He is on 3L home oxygen.  Sleep study with only mild OSA in 12/22 but with oxygen desaturation.  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  He smoked in the past but not heavily.  - Will get PFTs now that  he has been diuresed.  8. Dental pain: Had dental procedure prior to admit. Sutures in place. Reporting pain.  -Did not f/u d/t loss of insurance -Will contact Dr. Raynelle Dick office to request dental consult.   HH PT recommended at discharge. Needs OT eval. TOC consult to confirm insurance coverage. ? Medicare listed  Length of Stay: 6  FINCH, LINDSAY N, PA-C  08/20/2021, 8:06 AM  Advanced Heart Failure Team Pager [phone] (M-F; 7a - 5p)  Please contact [facility] for night-coverage after hours (5p -7a ) and weekends on [email or web address]   Patient seen with PA, agree with the above note.   Weight down 8 lbs.  Creatinine lower 3.92.  Breathing improved. Remains on milrinone 0.125.   General: NAD Neck: Thick, no JVD, no thyromegaly or thyroid nodule.  Lungs: Clear to auscultation bilaterally with normal respiratory effort. CV: Nondisplaced PMI.  Heart regular S1/S2, no S3/S4, no murmur.  No peripheral edema.   Abdomen: Soft, nontender, no hepatosplenomegaly, no distention.  Skin: Intact without lesions or rashes.  Neurologic: Alert and oriented x 3.  Psych: Normal affect. Extremities: No clubbing or cyanosis.  HEENT: Normal.   Volume status better, weight down 20 lbs.  Creatinine down to 3.92.  - Stop milrinone today.  - Continue torsemide 80 mg daily.  - Continue midodrine 5 tid.   He remains in NSR on amiodarone.    Still with pain at site of tooth extractions and needs sutures out, lost his outpatient dentist.  Have asked hospital dental service to see if possible.   He should be able to go home tomorrow if tolerates coming off milrinone.   [name] 08/20/2021 8:48 AM

## 2021-08-20 NOTE — TOC Progression Note (Addendum)
Transition of Care [facility]) - Progression Note    Patient Details  Name: [name] [name] MRN: [id number] Date of Birth: August 25, 1945  Transition of Care [facility]) CM/SW Contact  Zenon Mayo, RN Phone Number: 08/20/2021, 10:52 AM  Clinical Narrative:    Patient is active with [place] for [facility], [place], [place],  asked MD for resumption orders, also benefit check in progress to confirm active insurance. He does have Dental coverage as well. Nephrology follow up on AVS.  1/19- Await dentis to remove sutures.    Expected Discharge Plan: [place] Barriers to Discharge: Continued Medical Work up  Expected Discharge Plan and Services Expected Discharge Plan: [place] In-house Referral: Clinical Social Work Discharge Planning Services: CM Consult Post Acute Care Choice: Home Health, Resumption of Svcs/PTA Provider Living arrangements for the past 2 months: Single Family Home                 DME Arranged: N/A DME Agency: NA       HH Arranged: RN, Disease Management, PT, OT HH Agency: [place] Date HH Agency Contacted: 08/16/21 Time [place]: 1549 Representative spoke with at [place]: [place] ([place]) Interventions    Readmission Risk Interventions Readmission Risk Prevention Plan 08/16/2021 01/31/2021 12/29/2020  Transportation Screening Complete Complete Complete  Medication Review Press photographer) Complete - Complete  PCP or Specialist appointment within 3-5 days of discharge Complete - Complete  HRI or Home Care Consult Complete Complete Complete  SW Recovery Care/Counseling Consult Complete Complete Complete  Palliative Care Screening Not Applicable Complete Not [place] Not Applicable Complete Complete  Some recent data might be hidden

## 2021-08-20 NOTE — Progress Notes (Signed)
Left a message at [place] regarding consult.  Recently had outpatient dental surgery. Unable to obtain f/u d/t insurance reasons.  Still has sutures and complaining of pain.

## 2021-08-20 NOTE — Evaluation (Signed)
Occupational Therapy Evaluation Patient Details Name: [name] [name] MRN: [id number] DOB: Jul 10, 1946 Today's Date: 08/20/2021   History of Present Illness Pt is a 76 yo male returning with LE edema and SOB after recent admission 06/11/21. Upon work up pt with fluid overload and increased O2 demands. PMH includes: CHF,  afib, CAD, CKD III, COPD, HTN, HLD, LBBB, NSTEMI (2018), DM II, Non hodgkins lymphoma, sleep apnea (sleeps in recliner), morbid obesity, and neuropathy. Uses 3-4L O2 at baseline.   Clinical Impression   PTA Patient reports completing ADLs with some assist for LB from aide (compression socks) and using AE, mobility using RW and assist for IADLs.  Pt admitted for above and presenting with problem list below, including generalized weakness, impaired balance, decreased activity tolerance.  On 3L during session via [place] with VSS, cueing for PLB to maintain SPO2 during activity (initial desaturation to 87%).  Pt he requires up to min assist for ADLs and min guard for transfers and limited in room mobility.  Pt back to bed at end of session as reports fatigued from not sleeping well last night.  Encouraged mobility to restroom with assist during the day, and OOB for all his meals.  Recommend continued OT services acutely and after dc at [facility] level to optimize independence and safety with ADLs, mobility. Will follow.      Recommendations for follow up therapy are one component of a multi-disciplinary discharge planning process, led by the attending physician.  Recommendations may be updated based on patient status, additional functional criteria and insurance authorization.   Follow Up Recommendations  Home health OT    Assistance Recommended at Discharge Frequent or constant Supervision/Assistance  Patient can return home with the following A little help with walking and/or transfers;A little help with bathing/dressing/bathroom;Assistance with cooking/housework    Functional Status  Assessment  Patient has had a recent decline in their functional status and demonstrates the ability to make significant improvements in function in a reasonable and predictable amount of time.  Equipment Recommendations  BSC/3in1 (bariatric)    Recommendations for Other Services       Precautions / Restrictions Precautions Precautions: Fall Precaution Comments: watch O2 Restrictions Weight Bearing Restrictions: No      Mobility Bed Mobility Overal bed mobility: Needs Assistance Bed Mobility: Supine to Sit, Sit to Supine     Supine to sit: Supervision Sit to supine: Supervision        Transfers                          Balance Overall balance assessment: Needs assistance Sitting-balance support: Feet supported, No upper extremity supported Sitting balance-Leahy Scale: Good     Standing balance support: Bilateral upper extremity supported, Single extremity supported, During functional activity Standing balance-Leahy Scale: Fair Standing balance comment: relies on 1 UE support                           ADL either performed or assessed with clinical judgement   ADL Overall ADL's : Needs assistance/impaired     Grooming: Set up;Sitting   Upper Body Bathing: Set up;Sitting   Lower Body Bathing: Minimal assistance;Sit to/from stand   Upper Body Dressing : Set up;Sitting   Lower Body Dressing: Min guard;Sit to/from stand Lower Body Dressing Details (indicate cue type and reason): typically uses reacher/sock aide for LB dressing, has aide who assists with compression socks Toilet Transfer: Min guard;Ambulation;Rolling  walker (2 wheels)   Toileting- Clothing Manipulation and Hygiene: Min guard;Sit to/from stand Toileting - Clothing Manipulation Details (indicate cue type and reason): educated on safety and one handed support to balance     Functional mobility during ADLs: Min guard;Rolling walker (2 wheels) General ADL Comments: pt limited  by activity tolerance, reports not sleeping well last night and requests to return back to bed to nap.  P     Vision   Vision Assessment?: No apparent visual deficits     Perception     Praxis      Pertinent Vitals/Pain Pain Assessment Pain Assessment: Faces Faces Pain Scale: Hurts a little bit Pain Location: L thigh Pain Descriptors / Indicators: Discomfort, Guarding, Grimacing, Sore Pain Intervention(s): Limited activity within patient's tolerance, Monitored during session, Repositioned     Hand Dominance Right   Extremity/Trunk Assessment Upper Extremity Assessment Upper Extremity Assessment: Generalized weakness   Lower Extremity Assessment Lower Extremity Assessment: Defer to PT evaluation       Communication Communication Communication: No difficulties   Cognition Arousal/Alertness: Awake/alert Behavior During Therapy: Flat affect Overall Cognitive Status: Within Functional Limits for tasks assessed                                       General Comments  on 3L O2 via [place], spo2 desaturation to 87% with limited activity, cueing for PLB.  Pt tends to hold breath during activity and maintained >90% when cued to breathe.    Exercises     Shoulder Instructions      Home Living Family/patient expects to be discharged to:: Private residence Living Arrangements: Children (daugher and her husband) Available Help at Discharge: Family;Available 24 hours/day Type of Home: House Home Access: Stairs to enter [employer] of Steps: 1 Entrance Stairs-Rails: None Home Layout: One level     Bathroom Shower/Tub: Sponge bathes at baseline   Bathroom Toilet: Handicapped height Bathroom Accessibility: No   Home Equipment: Conservation officer, nature (2 wheels);Cane - single point;Hand held shower head;Shower seat;Wheelchair - power;Electric scooter;Adaptive equipment;Grab bars - toilet;Toilet riser Adaptive Equipment: Reacher;Sock aid Additional Comments:  pt sleeps in recliner, uses 3-4L O2 at home, has pulse ox to monitor. Daughter uses pt's power w/c because she is disabled by back pain. Her husband works. Pt reports they all take care of each other. Pt reports RW does not fit in bathroom.      Prior Functioning/Environment Prior Level of Function : Needs assist       Physical Assist : Mobility (physical) Mobility (physical): Gait   Mobility Comments: can walk bed to bathroom but uses scooter for any distance longer than this ADLs Comments: independent ADLs (uses sock aide/reacher), aide 1x/week for IADLs and dressing pt's legs.        OT Problem List: Decreased strength;Decreased activity tolerance;Impaired balance (sitting and/or standing);Decreased safety awareness;Decreased knowledge of use of DME or AE;Decreased knowledge of precautions;Cardiopulmonary status limiting activity;Obesity      OT Treatment/Interventions: Self-care/ADL training;Therapeutic exercise;DME and/or AE instruction;Energy conservation;Therapeutic activities;Patient/family education;Balance training    OT Goals(Current goals can be found in the care plan section) Acute Rehab OT Goals Patient Stated Goal: home and get stronger OT Goal Formulation: With patient Time For Goal Achievement: 09/03/21 Potential to Achieve Goals: Good  OT Frequency: Min 2X/week    Co-evaluation              AM-PAC OT "6  Clicks" Daily Activity     Outcome Measure Help from another person eating meals?: None Help from another person taking care of personal grooming?: A Little Help from another person toileting, which includes using toliet, bedpan, or urinal?: A Little Help from another person bathing (including washing, rinsing, drying)?: A Little Help from another person to put on and taking off regular upper body clothing?: A Little Help from another person to put on and taking off regular lower body clothing?: A Little 6 Click Score: 19   End of Session Equipment  Utilized During Treatment: Rolling walker (2 wheels) Nurse Communication: Mobility status;Other (comment) (OOB for meals)  Activity Tolerance: Patient tolerated treatment well Patient left: in bed;with call bell/phone within reach;with bed alarm set  OT Visit Diagnosis: Other abnormalities of gait and mobility (R26.89);Muscle weakness (generalized) (M62.81)                Time: 8295-6213 OT Time Calculation (min): 22 min Charges:  OT General Charges $OT Visit: 1 Visit OT Evaluation $OT Eval Moderate Complexity: 1 Mod  Jolaine Artist, OT Acute Rehabilitation Services Pager [phone] Office [phone]   Delight Stare 08/20/2021, 10:25 AM

## 2021-08-20 NOTE — Progress Notes (Signed)
PROGRESS NOTE    [name] [name]  NWG:[id number] DOB: February 21, 1946 DOA: 08/14/2021 PCP: Nickola Major, MD   Chief Complaint  Patient presents with   Shortness of Breath   Leg Swelling    Brief Narrative:   [name] [name] is a 76 y.o. male with medical history significant for atrial fibrillation status post DCCV 11/18, CAD s/p stenting, CKD stage IV, pulmonary hypertension, essential hypertension, morbid obesity, non-Hodgkin's lymphoma treated with chemotherapy and radiation treatment, diabetes mellitus type 2, OSA with chronic hypoxemic respiratory failure who presented to the ED with worsening abdominal and lower extremity edema.  He is also noticing some dyspnea on exertion and claims he has gained approximately 20 pounds since discharge on 11/22. He was admitted for acute on chronic systolic and diastolic heart failure, started on IV milrinone and IV lasix gtt. He has been appropriately diuresed. Underwent RHC and reviewed the results with the patient.   Assessment & Plan:   Acute on chronic  systolic and diastolic CHF:  Moderate to severe pulmonary hypertension -Clinically improved significantly, diuresed with Lasix and milrinone drip  -Weight down 30 pounds, repeat heart right heart cath with clear optimize filling pressures  -Heart failure team following, milrinone dose decreased and discontinued today  -Transition to oral torsemide  -Remains on midodrine  -Creatinine is stable around 4  -Discharge planning, home tomorrow with home health services if stable  Paroxysmal atrial fibrillation Rate controlled.  S/p cardioversion,  On eliquis for anticoagulation.  Rate controlled with amiodarone 200 mg daily.   COPD/chronic hypoxic respiratory failure -On 3 L home O2 at baseline -Stable  Acute on Stage 4 CKD;  -Baseline creatinine around 2.7-3 -Creatinine on admission was 3.9, peaked at 4.2, now stable at 4.0, nephrology following -Mild worsening was cardiorenal -Good urine  output, no acute indications for hemodialysis, will need close nephrology follow-up after discharge, followed by [facility] nephrology  Hypokalemia Replaced.   Diabetes mellitus with hypoglycemia  Insulin dependent. - -Decreased Semglee dose to 10 units twice daily Hemoglobin A1c is 5. Continue with SSI.   Bradycardia:  Sinus bradycardia.   Hyperlipidemia:  Resume zocor.   Hypothyroidism  -Continue  Non-Hodgkin's lymphoma  treated with chemotherapy and radiation treatment,  Body mass index is 36.99 kg/m. Obesity.  Mild anemia and thrombocytopenia; Monitor.    Chronic respiratory failure in the setting of chronic systolic and diastolic heart failure and OSA.    DVT prophylaxis: Eliquis Code Status: Full code.  Family Communication: none at bedside.  Disposition: Home tomorrow with home health services if stable off midodrine         Consultants:  EP  Cardiology.   Procedures: ECHO  Antimicrobials: none.   Subjective: No new complaints.   Objective: Vitals:   08/20/21 0434 08/20/21 0727 08/20/21 0806 08/20/21 1202  BP:  (!) 108/55  110/71  Pulse:  61  64  Resp:  13  (!) 23  Temp:  97.9 F (36.6 C)    TempSrc:  Oral    SpO2:  92% 96% 96%  Weight: 127.2 kg     Height:        Intake/Output Summary (Last 24 hours) at 08/20/2021 1338 Last data filed at 08/20/2021 1256 Gross per 24 hour  Intake 1320 ml  Output 3575 ml  Net -2255 ml   Filed Weights   08/18/21 0648 08/19/21 0435 08/20/21 0434  Weight: (!) 137.2 kg 130.8 kg 127.2 kg    Examination:  General exam: Pleasant male sitting  up in bed, AAOx3, no distress HEENT: No JVD CVS: S1-S2, regular rate rhythm Lungs: Decreased breath sounds to bases otherwise clear Abdomen: Soft, nontender, bowel sounds present Extremities: No edema  Skin: No rashes on exposed skin Psychiatry: Mood & affect appropriate.         Data Reviewed: I have personally reviewed following labs and imaging  studies  CBC: Recent Labs  Lab 08/14/21 1040 08/15/21 0332 08/18/21 0209 08/18/21 1356 08/18/21 1357  WBC 5.3 5.0 5.9  --   --   NEUTROABS 3.4  --   --   --   --   HGB 12.8* 11.4* 11.0* 11.6* 11.9*  HCT 39.1 35.5* 33.6* 34.0* 35.0*  MCV 88.5 88.3 88.2  --   --   PLT 148* 138* 136*  --   --     Basic Metabolic Panel: Recent Labs  Lab 08/14/21 1040 08/15/21 0332 08/16/21 0240 08/17/21 0603 08/18/21 0209 08/18/21 1212 08/18/21 1356 08/18/21 1357 08/19/21 0418 08/20/21 0457  NA 141 141   < > 139 137 138 142 140 140 137  K 3.1* 3.1*   < > 3.5 3.6 3.5 3.4* 3.6 3.5 3.3*  CL 105 107   < > 104 102 102  --   --  100 96*  CO2 25 22   < > 20* 23 25  --   --  27 29  GLUCOSE 115* 60*   < > 112* 110* 93  --   --  93 96  BUN 68* 62*   < > 63* 63* 61*  --   --  64* 65*  CREATININE 4.06* 3.97*   < > 4.23* 4.07* 4.09*  --   --  4.03* 3.92*  CALCIUM 8.9 8.5*   < > 8.7* 8.7* 8.8*  --   --  9.0 9.0  MG 2.3 2.2  --   --   --   --   --   --  2.0  --    < > = values in this interval not displayed.    GFR: Estimated Creatinine Clearance: 23.1 mL/min (A) (by C-G formula based on SCr of 3.92 mg/dL (H)).  Liver Function Tests: No results for input(s): AST, ALT, ALKPHOS, BILITOT, PROT, ALBUMIN in the last 168 hours.  CBG: Recent Labs  Lab 08/19/21 1114 08/19/21 1618 08/19/21 2119 08/20/21 0605 08/20/21 1202  GLUCAP 100* 105* 140* 119* 97     Recent Results (from the past 240 hour(s))  Resp Panel by RT-PCR (Flu A&B, Covid) Nasopharyngeal Swab     Status: None   Collection Time: 08/14/21 11:21 AM   Specimen: Nasopharyngeal Swab; Nasopharyngeal(NP) swabs in vial transport medium  Result Value Ref Range Status   SARS Coronavirus 2 by RT PCR NEGATIVE NEGATIVE Final    Comment: (NOTE) SARS-CoV-2 target nucleic acids are NOT DETECTED.  The SARS-CoV-2 RNA is generally detectable in upper respiratory specimens during the acute phase of infection. The lowest concentration of  SARS-CoV-2 viral copies this assay can detect is 138 copies/mL. A negative result does not preclude SARS-Cov-2 infection and should not be used as the sole basis for treatment or other patient management decisions. A negative result may occur with  improper specimen collection/handling, submission of specimen other than nasopharyngeal swab, presence of viral mutation(s) within the areas targeted by this assay, and inadequate number of viral copies(<138 copies/mL). A negative result must be combined with clinical observations, patient history, and epidemiological information. The expected result is Negative.  Fact  Sheet for Patients:  EntrepreneurPulse.com.au  Fact Sheet for Healthcare Providers:  IncredibleEmployment.be  This test is no t yet approved or cleared by the [place] FDA and  has been authorized for detection and/or diagnosis of SARS-CoV-2 by FDA under an Emergency Use Authorization (EUA). This EUA will remain  in effect (meaning this test can be used) for the duration of the COVID-19 declaration under Section 564(b)(1) of the Act, 21 U.S.C.section 360bbb-3(b)(1), unless the authorization is terminated  or revoked sooner.       Influenza A by PCR NEGATIVE NEGATIVE Final   Influenza B by PCR NEGATIVE NEGATIVE Final    Comment: (NOTE) The Xpert Xpress SARS-CoV-2/FLU/RSV plus assay is intended as an aid in the diagnosis of influenza from Nasopharyngeal swab specimens and should not be used as a sole basis for treatment. Nasal washings and aspirates are unacceptable for Xpert Xpress SARS-CoV-2/FLU/RSV testing.  Fact Sheet for Patients: EntrepreneurPulse.com.au  Fact Sheet for Healthcare Providers: IncredibleEmployment.be  This test is not yet approved or cleared by the [place] FDA and has been authorized for detection and/or diagnosis of SARS-CoV-2 by FDA under an Emergency Use  Authorization (EUA). This EUA will remain in effect (meaning this test can be used) for the duration of the COVID-19 declaration under Section 564(b)(1) of the Act, 21 U.S.C. section 360bbb-3(b)(1), unless the authorization is terminated or revoked.  Performed at [facility], [address] [place], [place] [address]           Radiology Studies: CARDIAC CATHETERIZATION  Result Date: 08/18/2021 1. Elevated RV filling pressure. 2. Pulmonary venous hypertension/high output PH. 3. High cardiac output in setting of milrinone use. 4. Normal PCWP.        Scheduled Meds:  albuterol  2.5 mg Nebulization Once   amiodarone  200 mg Oral Daily   apixaban  5 mg Oral BID   atorvastatin  20 mg Oral Daily   insulin aspart  0-9 Units Subcutaneous TID WC   insulin glargine-yfgn  10 Units Subcutaneous QHS   levothyroxine  300 mcg Oral QAC breakfast   loratadine  10 mg Oral Daily   midodrine  5 mg Oral TID WC   mometasone-formoterol  2 puff Inhalation BID   sodium chloride flush  3 mL Intravenous Q12H   sodium chloride flush  3 mL Intravenous Q12H   torsemide  80 mg Oral Daily   Continuous Infusions:  sodium chloride       LOS: 6 days        Domenic Polite, MD [facility]   08/20/2021, 1:38 PM

## 2021-08-20 NOTE — Progress Notes (Signed)
Mobility Specialist Progress Note: ° ° 08/20/21 1556  °Mobility  °Activity Ambulated with assistance in hallway  °Level of Assistance Standby assist, set-up cues, supervision of patient - no hands on  °Assistive Device Front wheel walker  °Distance Ambulated (ft) 70 ft  °Activity Response Tolerated well  °$Mobility charge 1 Mobility  ° °Pt received in bed willing to participate in mobility. No complaints of pain and asymptomatic. Pt left in bed with call bell in reach and all needs met.  ° °  °Mobility Specialist °Primary Phone [phone] °Secondary Phone [phone] ° °

## 2021-08-21 DIAGNOSIS — Z0120 Encounter for dental examination and cleaning without abnormal findings: Secondary | ICD-10-CM

## 2021-08-21 DIAGNOSIS — K08199 Complete loss of teeth due to other specified cause, unspecified class: Secondary | ICD-10-CM

## 2021-08-21 DIAGNOSIS — J9622 Acute and chronic respiratory failure with hypercapnia: Secondary | ICD-10-CM | POA: Diagnosis not present

## 2021-08-21 DIAGNOSIS — I5043 Acute on chronic combined systolic (congestive) and diastolic (congestive) heart failure: Secondary | ICD-10-CM | POA: Diagnosis not present

## 2021-08-21 DIAGNOSIS — J9621 Acute and chronic respiratory failure with hypoxia: Secondary | ICD-10-CM | POA: Diagnosis not present

## 2021-08-21 LAB — GLUCOSE, CAPILLARY
Glucose-Capillary: 86 mg/dL (ref 70–99)
Glucose-Capillary: 72 mg/dL (ref 70–99)

## 2021-08-21 LAB — BASIC METABOLIC PANEL
Anion gap: 13 (ref 5–15)
BUN: 67 mg/dL — ABNORMAL HIGH (ref 8–23)
CO2: 31 mmol/L (ref 22–32)
Calcium: 8.9 mg/dL (ref 8.9–10.3)
Chloride: 94 mmol/L — ABNORMAL LOW (ref 98–111)
Creatinine, Ser: 3.73 mg/dL — ABNORMAL HIGH (ref 0.61–1.24)
GFR, Estimated: 16 mL/min — ABNORMAL LOW (ref >60)
Glucose, Bld: 84 mg/dL (ref 70–99)
Potassium: 3.3 mmol/L — ABNORMAL LOW (ref 3.5–5.1)
Sodium: 138 mmol/L (ref 135–145)

## 2021-08-21 LAB — CBC
HCT: 38.6 % — ABNORMAL LOW (ref 39.0–52.0)
Hemoglobin: 13.1 g/dL (ref 13.0–17.0)
MCH: 29.2 pg (ref 26.0–34.0)
MCHC: 33.9 g/dL (ref 30.0–36.0)
MCV: 86 fL (ref 80.0–100.0)
Platelets: 138 10*3/uL — ABNORMAL LOW (ref 150–400)
RBC: 4.49 MIL/uL (ref 4.22–5.81)
RDW: 15.3 % (ref 11.5–15.5)
WBC: 6.3 10*3/uL (ref 4.0–10.5)
nRBC: 0 % (ref 0.0–0.2)

## 2021-08-21 MED ORDER — POTASSIUM CHLORIDE CRYS ER 10 MEQ PO TBCR: 20.0000 meq | EXTENDED_RELEASE_TABLET | Freq: Every day | ORAL | 0 refills | Status: AC

## 2021-08-21 MED ORDER — TRESIBA FLEXTOUCH 200 UNIT/ML ~~LOC~~ SOPN: 15.0000 [IU] | PEN_INJECTOR | Freq: Every day | SUBCUTANEOUS | Status: AC

## 2021-08-21 MED ORDER — MIDODRINE HCL 5 MG PO TABS: 5.0000 mg | ORAL_TABLET | Freq: Two times a day (BID) | ORAL | 0 refills | Status: DC

## 2021-08-21 MED ORDER — ATORVASTATIN CALCIUM 20 MG PO TABS: 20.0000 mg | ORAL_TABLET | Freq: Every day | ORAL | 0 refills | Status: AC

## 2021-08-21 MED ORDER — TORSEMIDE 40 MG PO TABS: 80.0000 mg | ORAL_TABLET | Freq: Every day | ORAL | 1 refills | Status: AC

## 2021-08-21 MED ORDER — MIDODRINE HCL 5 MG PO TABS: 5.0000 mg | ORAL_TABLET | Freq: Three times a day (TID) | ORAL | 0 refills | Status: AC

## 2021-08-21 MED ORDER — POTASSIUM CHLORIDE CRYS ER 20 MEQ PO TBCR
30.0000 meq | EXTENDED_RELEASE_TABLET | Freq: Once | ORAL | Status: AC
  Administered 2021-08-21: 30 meq via ORAL
  Filled 2021-08-21: qty 1

## 2021-08-21 NOTE — Discharge Summary (Signed)
Physician Discharge Summary  [name] [name] AST:[id number] DOB: 04/04/1946 DOA: 08/14/2021  PCP: Nickola Major, MD  Admit date: 08/14/2021 Discharge date: 08/21/2021  Time spent: 35  minutes  Recommendations for Outpatient Follow-up:  Nephrology Dr.Nwobu Ikechokwu on 2/1 CHF 1/25, needs Bmet in 1 week Home health services   Discharge Diagnoses:  Acute on chronic systolic and diastolic CHF Moderate to severe pulmonary hypertension Stage IV CKD Paroxysmal atrial fibrillation COPD/chronic respiratory failure on 3 L home O2 Hypokalemia   Type 2 diabetes mellitus with stage 4 chronic kidney disease ([place])   Hypertension   Acute on chronic systolic and diastolic heart failure, NYHA class 1 ([place])   Acute on chronic respiratory failure with hypoxia and hypercapnia (HCC)   AF (paroxysmal atrial fibrillation) (HCC)   CKD (chronic kidney disease) stage 4, GFR 15-29 ml/min (HCC) History of non-Hodgkin's lymphoma Chronic anemia Hypothyroidism  Discharge Condition: Stable  Diet recommendation: Low-sodium, heart healthy, diabetic  Filed Weights   08/19/21 0435 08/20/21 0434 08/21/21 0100  Weight: 130.8 kg 127.2 kg 125 kg    History of present illness:  [name] [name] is a 76 y.o. male with medical history significant for atrial fibrillation status post DCCV 11/18, CAD s/p stenting, CKD stage IV, pulmonary hypertension, essential hypertension, morbid obesity, non-Hodgkin's lymphoma treated with chemotherapy and radiation treatment, diabetes mellitus type 2, OSA with chronic hypoxemic respiratory failure who presented to the ED with worsening abdominal and lower extremity edema  Hospital Course:   Acute on chronic  systolic and diastolic CHF:  Moderate to severe pulmonary hypertension -Clinically improved significantly, diuresed with Lasix and milrinone drip  -Weight down 30 pounds, repeat heart right heart cath with near optimized filling pressures  -Heart failure team following, was  weaned off milrinone yesterday -Clinically stable on torsemide 80 Mg daily, remains euvolemic, midodrine continued at discharge -Creatinine improving now 3.7 -He will discharge home with home health services today, follow-up with CHF team next week and nephrologist in 2 weeks   Paroxysmal atrial fibrillation Rate controlled.  S/p cardioversion,  On eliquis for anticoagulation.  Rate controlled with amiodarone 200 mg daily.    COPD/chronic hypoxic respiratory failure -On 3 L home O2 at baseline -Stable   Acute on Stage 4 CKD;  Cardiorenal syndrome -Baseline creatinine around 2.7-3 -Creatinine on admission was 3.9, peaked at 4.2, now 3.7 at discharge, nephrology was following following -Remains on torsemide 80 Mg daily -Good urine output, no acute indications for hemodialysis, will need close nephrology follow-up after discharge,  follow-up arranged with Dr.Nwobu for 2/1   Hypokalemia Replaced.    Diabetes mellitus with hypoglycemia  Insulin dependent. - -Insulin dose decreased this admission, changed to Tresiba 15 units at discharge   Bradycardia:  Sinus bradycardia.,  zabeta discontinued   Hyperlipidemia:  Resume zocor.    Hypothyroidism  -Continue   History of Non-Hodgkin's lymphoma  -In remission, previously treated with chemotherapy and radiation   Body mass index is 36.99 kg/m. Obesity.   Mild anemia and thrombocytopenia; -Stable  Procedures: RHC Procedural Findings (milrinone 0.25): Hemodynamics (mmHg) RA mean 11 RV 69/14 PA 66/22, mean 37 PCWP mean 13 Oxygen saturations: PA 75% AO 96% Cardiac Output (Fick) 10.85  Cardiac Index (Fick) 4.23 PVR 2.2 WU  Consultations: Cardiology  Discharge Exam: Vitals:   08/21/21 0739 08/21/21 1131  BP:  119/68  Pulse:    Resp:  17  Temp:  98.7 F (37.1 C)  SpO2: 98% 98%    General exam: Pleasant male sitting  up in bed, AAOx3, no distress HEENT: No JVD CVS: S1-S2, regular rate rhythm Lungs: Decreased  breath sounds to bases otherwise clear Abdomen: Soft, nontender, bowel sounds present Extremities: No edema  Skin: No rashes on exposed skin Psychiatry: Mood & affect appropriate.   Discharge Instructions   Discharge Instructions     Diet - low sodium heart healthy   Complete by: As directed    Increase activity slowly   Complete by: As directed       Allergies as of 08/21/2021   No Known Allergies      Medication List     STOP taking these medications    amLODipine 10 MG tablet Commonly known as: NORVASC   bisoprolol 5 MG tablet Commonly known as: ZEBETA   isosorbide mononitrate 30 MG 24 hr tablet Commonly known as: IMDUR   simvastatin 5 MG tablet Commonly known as: ZOCOR       TAKE these medications    acetaminophen 325 MG tablet Commonly known as: TYLENOL Take 2 tablets (650 mg total) by mouth every 6 (six) hours as needed for mild pain (or Fever >/= 101).   amiodarone 200 MG tablet Commonly known as: PACERONE Take 200 mg by mouth daily.   atorvastatin 20 MG tablet Commonly known as: LIPITOR Take 1 tablet (20 mg total) by mouth daily. Start taking on: August 22, 2021   Eliquis 5 MG Tabs tablet Generic drug: apixaban Take 1 tablet (5 mg total) by mouth 2 (two) times daily. This is a dose change   fluticasone 50 MCG/ACT nasal spray Commonly known as: FLONASE Place 2 sprays into both nostrils daily as needed for allergies.   ipratropium-albuterol 0.5-2.5 (3) MG/3ML Soln Commonly known as: DUONEB Inhale 3 mLs into the lungs every 4 (four) hours as needed (shortness of breath).   levocetirizine 5 MG tablet Commonly known as: XYZAL Take 5 mg by mouth daily.   levothyroxine 200 MCG tablet Commonly known as: SYNTHROID Take 300 mcg by mouth daily before breakfast. (takes with 168mcg tab for a total of 357mcg)   levothyroxine 100 MCG tablet Commonly known as: SYNTHROID Take 100 mcg by mouth daily before breakfast. (Takes with 200 mcg tab for a  total of 300 mcg once daily)   midodrine 5 MG tablet Commonly known as: PROAMATINE Take 1 tablet (5 mg total) by mouth 3 (three) times daily with meals.   nitroGLYCERIN 0.4 MG SL tablet Commonly known as: NITROSTAT Place 0.4 mg under the tongue every 5 (five) minutes as needed for chest pain.   OXYGEN Inhale 3-4 L into the lungs continuous.   potassium chloride 10 MEQ tablet Commonly known as: KLOR-CON M Take 2 tablets (20 mEq total) by mouth daily. What changed: how much to take   senna-docusate 8.6-50 MG tablet Commonly known as: Senokot-S Take 1 tablet by mouth daily as needed for mild constipation.   sodium chloride 0.65 % Soln nasal spray Commonly known as: OCEAN Place 1 spray into both nostrils as needed for congestion.   Symbicort 160-4.5 MCG/ACT inhaler Generic drug: budesonide-formoterol Inhale 2 puffs into the lungs daily as needed (shortness of breath).   Torsemide 40 MG Tabs Take 80 mg by mouth daily. What changed:  medication strength how much to take when to take this   Tresiba FlexTouch 200 UNIT/ML FlexTouch Pen Generic drug: insulin degludec Inject 16 Units into the skin daily. What changed: how much to take       No Known Allergies  Follow-up Information  [facility], [place] Follow up.   Specialty: Home Health Services Why: Registered Nurse and Physical Therapy, Occupational Therapy-office to call with visit times. Contact information: [place] [address] [phone]         Tracie Harrier, MD Follow up on 09/03/2021.   Why: 2:40 , has apt already Contact information: [name] [place] [address] [place] Follow up on 08/27/2021.   Specialty: Cardiology Why: [facility] at [facility] 2:30 pm Entrance C, Garage Code 1202 Contact information: [address] [id number] [place] [phone]                 The results of significant diagnostics from this hospitalization (including imaging, microbiology, ancillary and laboratory) are listed below for reference.    Significant Diagnostic Studies: CARDIAC CATHETERIZATION  Result Date: 08/18/2021 1. Elevated RV filling pressure. 2. Pulmonary venous hypertension/high output PH. 3. High cardiac output in setting of milrinone use. 4. Normal PCWP.   US RENAL  Result Date: 08/17/2021 CLINICAL DATA:  Acute renal failure EXAM: RENAL / URINARY TRACT ULTRASOUND COMPLETE COMPARISON:  10/10/2020 FINDINGS: Right Kidney: Renal measurements: 10.2 x 4.2 x 5.0 cm. = volume: 112 mL. Echogenicity within normal limits. No mass or hydronephrosis visualized. Left Kidney: Renal measurements: 9.1 x 4.1 x 4.1 cm. = volume: 79 mL. Echogenicity within normal limits. No mass or hydronephrosis visualized. Bladder: Appears normal for degree of bladder distention. Other: None. IMPRESSION: Examination is somewhat limited by patient body habitus. No obstructive changes are seen. No mass lesion is noted. Electronically Signed   By: Inez Catalina M.D.   On: 08/17/2021 21:47   DG Chest Port 1 View  Result Date: 08/14/2021 CLINICAL DATA:  Chest pain, shortness of breath. EXAM: PORTABLE CHEST 1 VIEW COMPARISON:  June 20, 2021. FINDINGS: Stable cardiomediastinal silhouette. Mild central pulmonary vascular congestion is noted with possible minimal bilateral pulmonary edema. Bony thorax is unremarkable. IMPRESSION: Stable cardiomegaly with mild central pulmonary vascular congestion and possible bilateral pulmonary edema. Electronically Signed   By: Marijo Conception M.D.   On: 08/14/2021 11:49   ECHOCARDIOGRAM COMPLETE  Result Date: 08/14/2021    ECHOCARDIOGRAM REPORT   Patient Name:   [name] [name] Date of Exam: 08/14/2021 Medical Rec #:  [id number]     Height:       73.0 in Accession #:    [id number]    Weight:       300.0 lb Date  of Birth:  02/27/1946     BSA:          2.556 m Patient Age:    75 years      BP:           135/58 mmHg Patient Gender: M             HR:           83 bpm. Exam Location:  [facility] Procedure: 2D Echo, Cardiac Doppler and Color Doppler Indications:    CHF-Acute Diastolic I94.85  History:        Patient has prior history of Echocardiogram examinations, most                 recent 06/20/2021. CHF, CAD and Previous Myocardial Infarction,  COPD, Arrythmias:Atrial Fibrillation and LBBB; Risk                 Factors:Diabetes, Hypertension, Former Smoker and Dyslipidemia.                 Hx of Pneumonia due to COVID-19 virus, Non Hodgkin's lymphoma                 ([place]) (From Hx).  Sonographer:    Alvino Chapel RCS Referring Phys: [id number] [place]  1. Left ventricular ejection fraction, by estimation, is 45 to 50%. The left ventricle has mildly decreased function. The left ventricle demonstrates global hypokinesis. The left ventricular internal cavity size was mildly dilated. There is mild left ventricular hypertrophy. Left ventricular diastolic parameters are consistent with Grade III diastolic dysfunction (restrictive).  2. Right ventricular systolic function is mildly to moderately reduced. The right ventricular size is mildly enlarged. There is moderately elevated pulmonary artery systolic pressure. The estimated right ventricular systolic pressure is 45.8 mmHg.  3. Left atrial size was moderately dilated.  4. Right atrial size was moderately dilated.  5. Tricuspid valve regurgitation is mild to moderate.  6. The aortic valve is tricuspid. There is mild calcification of the aortic valve. Aortic valve regurgitation is not visualized. Aortic valve mean gradient measures 9.0 mmHg.  7. The inferior vena cava is dilated in size with >50% respiratory variability, suggesting right atrial pressure of 8 mmHg.  8. The mitral valve is grossly normal. Trivial mitral valve regurgitation.  Comparison(s): No significant change from prior study. Prior images reviewed side by side. FINDINGS  Left Ventricle: Left ventricular ejection fraction, by estimation, is 45 to 50%. The left ventricle has mildly decreased function. The left ventricle demonstrates global hypokinesis. Definity contrast agent was given IV to delineate the left ventricular  endocardial borders. The left ventricular internal cavity size was mildly dilated. There is mild left ventricular hypertrophy. Left ventricular diastolic parameters are consistent with Grade III diastolic dysfunction (restrictive). Right Ventricle: The right ventricular size is mildly enlarged. No increase in right ventricular wall thickness. Right ventricular systolic function is mildly reduced. There is moderately elevated pulmonary artery systolic pressure. The tricuspid regurgitant velocity is 3.41 m/s, and with an assumed right atrial pressure of 8 mmHg, the estimated right ventricular systolic pressure is 09.9 mmHg. Left Atrium: Left atrial size was moderately dilated. Right Atrium: Right atrial size was moderately dilated. Pericardium: There is no evidence of pericardial effusion. Mitral Valve: The mitral valve is grossly normal. Trivial mitral valve regurgitation. Tricuspid Valve: The tricuspid valve is grossly normal. Tricuspid valve regurgitation is mild to moderate. Aortic Valve: The aortic valve is tricuspid. There is mild calcification of the aortic valve. There is mild aortic valve annular calcification. Aortic valve regurgitation is not visualized. Aortic valve mean gradient measures 9.0 mmHg. Aortic valve peak gradient measures 14.7 mmHg. Aortic valve area, by VTI measures 1.95 cm. Pulmonic Valve: The pulmonic valve was grossly normal. Pulmonic valve regurgitation is mild. Aorta: The aortic root is normal in size and structure. Venous: The inferior vena cava is dilated in size with greater than 50% respiratory variability, suggesting right atrial  pressure of 8 mmHg. IAS/Shunts: No atrial level shunt detected by color flow Doppler.  LEFT VENTRICLE PLAX 2D LVIDd:         6.20 cm LVIDs:         4.80 cm LV PW:         1.00 cm LV IVS:  1.10 cm LVOT diam:     2.00 cm LV SV:         108 LV SV Index:   42 LVOT Area:     3.14 cm  RIGHT VENTRICLE TAPSE (M-mode): 2.4 cm LEFT ATRIUM              Index        RIGHT ATRIUM           Index LA diam:        4.70 cm  1.84 cm/m   RA Area:     31.00 cm LA Vol (A2C):   145.0 ml 56.72 ml/m  RA Volume:   123.00 ml 48.12 ml/m LA Vol (A4C):   116.0 ml 45.38 ml/m LA Biplane Vol: 132.0 ml 51.64 ml/m  AORTIC VALVE AV Area (Vmax):    2.13 cm AV Area (Vmean):   2.03 cm AV Area (VTI):     1.95 cm AV Vmax:           192.00 cm/s AV Vmean:          142.000 cm/s AV VTI:            0.555 m AV Peak Grad:      14.7 mmHg AV Mean Grad:      9.0 mmHg LVOT Vmax:         130.00 cm/s LVOT Vmean:        91.800 cm/s LVOT VTI:          0.344 m LVOT/AV VTI ratio: 0.62  AORTA Ao Root diam: 3.80 cm MITRAL VALVE                TRICUSPID VALVE MV Area (PHT): 4.39 cm     TR Peak grad:   46.5 mmHg MV Decel Time: 173 msec     TR Vmax:        341.00 cm/s MV E velocity: 127.00 cm/s MV A velocity: 58.00 cm/s   SHUNTS MV E/A ratio:  2.19         Systemic VTI:  0.34 m                             Systemic Diam: 2.00 cm Rozann Lesches MD Electronically signed by Rozann Lesches MD Signature Date/Time: 08/14/2021/5:00:25 PM    Final     Microbiology: Recent Results (from the past 240 hour(s))  Resp Panel by RT-PCR (Flu A&B, Covid) Nasopharyngeal Swab     Status: None   Collection Time: 08/14/21 11:21 AM   Specimen: Nasopharyngeal Swab; Nasopharyngeal(NP) swabs in vial transport medium  Result Value Ref Range Status   SARS Coronavirus 2 by RT PCR NEGATIVE NEGATIVE Final    Comment: (NOTE) SARS-CoV-2 target nucleic acids are NOT DETECTED.  The SARS-CoV-2 RNA is generally detectable in upper respiratory specimens during the acute phase of  infection. The lowest concentration of SARS-CoV-2 viral copies this assay can detect is 138 copies/mL. A negative result does not preclude SARS-Cov-2 infection and should not be used as the sole basis for treatment or other patient management decisions. A negative result may occur with  improper specimen collection/handling, submission of specimen other than nasopharyngeal swab, presence of viral mutation(s) within the areas targeted by this assay, and inadequate number of viral copies(<138 copies/mL). A negative result must be combined with clinical observations, patient history, and epidemiological information. The expected result is Negative.  Fact Sheet for Patients:  EntrepreneurPulse.com.au  Fact Sheet for Healthcare Providers:  IncredibleEmployment.be  This test is no t yet approved or cleared by the [place] FDA and  has been authorized for detection and/or diagnosis of SARS-CoV-2 by FDA under an Emergency Use Authorization (EUA). This EUA will remain  in effect (meaning this test can be used) for the duration of the COVID-19 declaration under Section 564(b)(1) of the Act, 21 U.S.C.section 360bbb-3(b)(1), unless the authorization is terminated  or revoked sooner.       Influenza A by PCR NEGATIVE NEGATIVE Final   Influenza B by PCR NEGATIVE NEGATIVE Final    Comment: (NOTE) The Xpert Xpress SARS-CoV-2/FLU/RSV plus assay is intended as an aid in the diagnosis of influenza from Nasopharyngeal swab specimens and should not be used as a sole basis for treatment. Nasal washings and aspirates are unacceptable for Xpert Xpress SARS-CoV-2/FLU/RSV testing.  Fact Sheet for Patients: EntrepreneurPulse.com.au  Fact Sheet for Healthcare Providers: IncredibleEmployment.be  This test is not yet approved or cleared by the [place] FDA and has been authorized for detection and/or diagnosis of SARS-CoV-2  by FDA under an Emergency Use Authorization (EUA). This EUA will remain in effect (meaning this test can be used) for the duration of the COVID-19 declaration under Section 564(b)(1) of the Act, 21 U.S.C. section 360bbb-3(b)(1), unless the authorization is terminated or revoked.  Performed at [facility], [address], [place], [place] [address]      Labs: Basic Metabolic Panel: Recent Labs  Lab 08/15/21 0332 08/16/21 0240 08/18/21 0209 08/18/21 1212 08/18/21 1356 08/18/21 1357 08/19/21 0418 08/20/21 0457 08/21/21 0359  NA 141   < > 137 138 142 140 140 137 138  K 3.1*   < > 3.6 3.5 3.4* 3.6 3.5 3.3* 3.3*  CL 107   < > 102 102  --   --  100 96* 94*  CO2 22   < > 23 25  --   --  27 29 31   GLUCOSE 60*   < > 110* 93  --   --  93 96 84  BUN 62*   < > 63* 61*  --   --  64* 65* 67*  CREATININE 3.97*   < > 4.07* 4.09*  --   --  4.03* 3.92* 3.73*  CALCIUM 8.5*   < > 8.7* 8.8*  --   --  9.0 9.0 8.9  MG 2.2  --   --   --   --   --  2.0  --   --    < > = values in this interval not displayed.   Liver Function Tests: No results for input(s): AST, ALT, ALKPHOS, BILITOT, PROT, ALBUMIN in the last 168 hours. No results for input(s): LIPASE, AMYLASE in the last 168 hours. No results for input(s): AMMONIA in the last 168 hours. CBC: Recent Labs  Lab 08/15/21 0332 08/18/21 0209 08/18/21 1356 08/18/21 1357 08/21/21 0359  WBC 5.0 5.9  --   --  6.3  HGB 11.4* 11.0* 11.6* 11.9* 13.1  HCT 35.5* 33.6* 34.0* 35.0* 38.6*  MCV 88.3 88.2  --   --  86.0  PLT 138* 136*  --   --  138*   Cardiac Enzymes: No results for input(s): CKTOTAL, CKMB, CKMBINDEX, TROPONINI in the last 168 hours. BNP: BNP (last 3 results) Recent Labs    06/11/21 1338 07/15/21 1531 08/14/21 1040  BNP 199.1* 100.1* 342.0*    ProBNP (last 3 results) No results for input(s): PROBNP in the  last 8760 hours.  CBG: Recent Labs  Lab 08/20/21 1202 08/20/21 1518 08/20/21 2133 08/21/21 0618 08/21/21 1128   GLUCAP 97 135* 143* 86 72       Signed:  Domenic Polite MD.  [facility] 08/21/2021, 12:14 PM

## 2021-08-21 NOTE — TOC Transition Note (Signed)
Transition of Care [facility]) - CM/SW Discharge Note   Patient Details  Name: [name] [name] MRN: [id number] Date of Birth: 1946-01-10  Transition of [facility] CM/SW Contact:  Zenon Mayo, RN Phone Number: 08/21/2021, 1:22 PM   Clinical Narrative:    Patient is for dc today, [place] with [facility] notified.  Occupational therapist states he needs a BSC as well. NCM made referral to [facility] for the [facility] this will be brought up to the room prior to dc.    Final next level of care: [place] Barriers to Discharge: No Barriers Identified   Patient Goals and CMS Choice Patient states their goals for this hospitalization and ongoing recovery are:: return home CMS Medicare.gov Compare Post Acute Care list provided to:: Patient Choice offered to / list presented to : Patient  Discharge Placement                       Discharge Plan and Services In-house Referral: Clinical Social Work Discharge Planning Services: CM Consult Post Acute Care Choice: Home Health, Resumption of Svcs/PTA Provider          DME Arranged: Bedside commode DME Agency: [facility] Date DME Agency Contacted: 08/21/21 Time DME Agency Contacted: [phone] Representative spoke with at DME Agency: [place]: RN, Disease Management, PT, OT [place] Agency: [place] Date [place]: 08/16/21 Time [place]: 1549 Representative spoke with at [place]: [place] ([place]) Interventions     Readmission Risk Interventions Readmission Risk Prevention Plan 08/16/2021 01/31/2021 12/29/2020  Transportation Screening Complete Complete Complete  Medication Review Press photographer) Complete - Complete  PCP or Specialist appointment within 3-5 days of discharge Complete - Complete  HRI or Home Care Consult Complete Complete Complete  SW Recovery Care/Counseling Consult Complete Complete Complete  Palliative Care Screening Not Applicable  Complete Not [place] Not Applicable Complete Complete  Some recent data might be hidden

## 2021-08-21 NOTE — Progress Notes (Signed)
Occupational Therapy Treatment Patient Details Name: [name] [name] MRN: [id number] DOB: April 23, 1946 Today's Date: 08/21/2021   History of present illness Pt is a 76 yo male returning with LE edema and SOB after recent admission 06/11/21. Upon work up pt with fluid overload and increased O2 demands. PMH includes: CHF,  afib, CAD, CKD III, COPD, HTN, HLD, LBBB, NSTEMI (2018), DM II, Non hodgkins lymphoma, sleep apnea (sleeps in recliner), morbid obesity, and neuropathy. Uses 3-4L O2 at baseline.   OT comments  Patient seated on commode upon entry, completing transfers with supervision and toileting with supervision (requires max encouragement for completing of hygiene after BM) given cueing for 1 handed techniques for safety.  Grooming at sink with supervision in standing.  Cueing for PLB throughout session, fatigues easily.  Reviewed energy conservation techniques and safety.  Plan for dc home today.  Continue with HHOT at dc.  Will follow acutely.    Recommendations for follow up therapy are one component of a multi-disciplinary discharge planning process, led by the attending physician.  Recommendations may be updated based on patient status, additional functional criteria and insurance authorization.    Follow Up Recommendations  Home health OT    Assistance Recommended at Discharge Frequent or constant Supervision/Assistance  Patient can return home with the following  A little help with walking and/or transfers;A little help with bathing/dressing/bathroom;Assistance with cooking/housework   Equipment Recommendations  BSC/3in1 (bariatric)    Recommendations for Other Services      Precautions / Restrictions Precautions Precautions: Fall Precaution Comments: watch O2 Restrictions Weight Bearing Restrictions: No       Mobility Bed Mobility               General bed mobility comments: OOB on commode upon entry    Transfers                         Balance  Overall balance assessment: Needs assistance Sitting-balance support: No upper extremity supported, Feet supported Sitting balance-Leahy Scale: Good     Standing balance support: No upper extremity supported, Single extremity supported, Bilateral upper extremity supported Standing balance-Leahy Scale: Fair Standing balance comment: relies on UE support dynamically but able to engage in ADLs with 0-1 hand support with supervision                           ADL either performed or assessed with clinical judgement   ADL Overall ADL's : Needs assistance/impaired     Grooming: Supervision/safety;Standing;Wash/dry hands                   Toilet Transfer: Supervision/safety;Ambulation;Rolling walker (2 wheels);BSC/3in1 Toilet Transfer Details (indicate cue type and reason): 3:1 over commode Toileting- Clothing Manipulation and Hygiene: Supervision/safety;Sit to/from stand Toileting - Clothing Manipulation Details (indicate cue type and reason): encouragement to complete, supervision for safety     Functional mobility during ADLs: Supervision/safety;Rolling walker (2 wheels) General ADL Comments: reviewed energy conservation techniques and safety    Extremity/Trunk Assessment              Vision       Perception     Praxis      Cognition Arousal/Alertness: Awake/alert Behavior During Therapy: Flat affect Overall Cognitive Status: Within Functional Limits for tasks assessed  Exercises      Shoulder Instructions       General Comments on 3L [place], cueing for PLB during activity    Pertinent Vitals/ Pain       Pain Assessment Pain Assessment: No/denies pain  Home Living                                          Prior Functioning/Environment              Frequency  Min 2X/week        Progress Toward Goals  OT Goals(current goals can now be found in the care plan  section)  Progress towards OT goals: Progressing toward goals  Acute Rehab OT Goals Patient Stated Goal: home today OT Goal Formulation: With patient Time For Goal Achievement: 09/03/21 Potential to Achieve Goals: Good  Plan Discharge plan remains appropriate;Frequency remains appropriate    Co-evaluation                 AM-PAC OT "6 Clicks" Daily Activity     Outcome Measure   Help from another person eating meals?: None Help from another person taking care of personal grooming?: A Little Help from another person toileting, which includes using toliet, bedpan, or urinal?: A Little Help from another person bathing (including washing, rinsing, drying)?: A Little Help from another person to put on and taking off regular upper body clothing?: A Little Help from another person to put on and taking off regular lower body clothing?: A Little 6 Click Score: 19    End of Session Equipment Utilized During Treatment: Rolling walker (2 wheels);Oxygen (3L)  OT Visit Diagnosis: Other abnormalities of gait and mobility (R26.89);Muscle weakness (generalized) (M62.81)   Activity Tolerance Patient tolerated treatment well   Patient Left with call bell/phone within reach;Other (comment) (seated EOB)   Nurse Communication Mobility status        Time: 8110-3159 OT Time Calculation (min): 19 min  Charges: OT General Charges $OT Visit: 1 Visit OT Treatments $Self Care/Home Management : 8-22 mins  Jolaine Artist, OT Acute Rehabilitation Services Pager [phone] Office [phone]   Delight Stare 08/21/2021, 1:41 PM

## 2021-08-21 NOTE — Progress Notes (Signed)
Physical Therapy Treatment Patient Details Name: [name] [name] MRN: [id number] DOB: 25-Apr-1946 Today's Date: 08/21/2021   History of Present Illness Pt is a 76 yo male returning with LE edema and SOB after recent admission 06/11/21. Upon work up pt with fluid overload and increased O2 demands. PMH includes: CHF,  afib, CAD, CKD III, COPD, HTN, HLD, LBBB, NSTEMI (2018), DM II, Non hodgkins lymphoma, sleep apnea (sleeps in recliner), morbid obesity, and neuropathy. Uses 3-4L O2 at baseline.    PT Comments    Pt already ambulated with mobility specialist this morning and plans to d/c home today, requesting to defer gait training this session. Thus, focused session on pt education and providing a HEP to address his lower extremity weakness as he reports it being difficult to get up off low surfaces. Pt does display lower extremity weakness and endurance deficits through only completing 6 sit to stand reps in 30 seconds while using his arms to assist himself to stand. Provided pt with HEP handout and educated him on managing his CHF, see General Comments below. Will continue to follow acutely. Current recommendations remain appropriate.    Recommendations for follow up therapy are one component of a multi-disciplinary discharge planning process, led by the attending physician.  Recommendations may be updated based on patient status, additional functional criteria and insurance authorization.  Follow Up Recommendations  Home health PT     Assistance Recommended at Discharge Intermittent Supervision/Assistance  Patient can return home with the following A little help with bathing/dressing/bathroom;Assist for transportation;Help with stairs or ramp for entrance;A little help with walking and/or transfers   Equipment Recommendations  None recommended by PT    Recommendations for Other Services       Precautions / Restrictions Precautions Precautions: Fall Precaution Comments: watch  O2 Restrictions Weight Bearing Restrictions: No     Mobility  Bed Mobility               General bed mobility comments: Pt sitting EOB upon arrival.    Transfers Overall transfer level: Needs assistance Equipment used:  (hands on bed/bedrail) Transfers: Sit to/from Stand Sit to Stand: Min guard           General transfer comment: Min guard for safety with pt pushing up and keeping hands on bed and bedrail for support, x6 reps in 30 sec.    Ambulation/Gait               General Gait Details: deferred per pt request as d/c'ing soon   Stairs             Wheelchair Mobility    Modified Rankin (Stroke Patients Only)       Balance Overall balance assessment: Needs assistance Sitting-balance support: No upper extremity supported, Feet supported Sitting balance-Leahy Scale: Good Sitting balance - Comments: Able to reach at least mod off BOS without LOB   Standing balance support: Bilateral upper extremity supported Standing balance-Leahy Scale: Poor Standing balance comment: Reliant on UE support                            Cognition Arousal/Alertness: Awake/alert Behavior During Therapy: Flat affect Overall Cognitive Status: Within Functional Limits for tasks assessed                                          Exercises  Other Exercises Other Exercises: 30 sec STS = 6 reps with UE use    General Comments General comments (skin integrity, edema, etc.): Educated pt on limiting sodium and processed foods intake, weighing self daily, breathing techniques, and increasing frequency of activity to manage CHF; educated on use of pulse ox; provided pt with [employer] HEP handout consisting of sit <> stands, seated hip abduction/adduction with pillow and theraband, and seated ankle dorsiflexion with theraband self-anchored. pt reports he has therabands at home      Pertinent Vitals/Pain Pain Assessment Pain Assessment:  Faces Faces Pain Scale: No hurt Pain Intervention(s): Monitored during session    Home Living                          Prior Function            PT Goals (current goals can now be found in the care plan section) Acute Rehab PT Goals Patient Stated Goal: to go home today PT Goal Formulation: With patient Time For Goal Achievement: 08/30/21 Potential to Achieve Goals: Fair Progress towards PT goals: Progressing toward goals    Frequency    Min 3X/week      PT Plan Current plan remains appropriate    Co-evaluation              AM-PAC PT "6 Clicks" Mobility   Outcome Measure  Help needed turning from your back to your side while in a flat bed without using bedrails?: None Help needed moving from lying on your back to sitting on the side of a flat bed without using bedrails?: A Little Help needed moving to and from a bed to a chair (including a wheelchair)?: A Little Help needed standing up from a chair using your arms (e.g., wheelchair or bedside chair)?: A Little Help needed to walk in hospital room?: A Little Help needed climbing 3-5 steps with a railing? : A Lot 6 Click Score: 18    End of Session Equipment Utilized During Treatment: Oxygen Activity Tolerance: Patient limited by fatigue Patient left: in bed;with call bell/phone within reach Nurse Communication: Mobility status PT Visit Diagnosis: Muscle weakness (generalized) (M62.81);Difficulty in walking, not elsewhere classified (R26.2)     Time: 4801-6553 PT Time Calculation (min) (ACUTE ONLY): 17 min  Charges:  $Therapeutic Activity: 8-22 mins                     Moishe Spice, PT, DPT Acute Rehabilitation Services  Pager: [phone] Office: [place] 08/21/2021, 2:42 PM

## 2021-08-21 NOTE — Progress Notes (Signed)
Mobility Specialist Progress Note:   08/21/21 0934  Mobility  Activity Ambulated with assistance in hallway  Level of Assistance Standby assist, set-up cues, supervision of patient - no hands on  Assistive Device Front wheel walker  Distance Ambulated (ft) 70 ft  Activity Response Tolerated well  $Mobility charge 1 Mobility   Pt received in bed willing to participate in mobility. No complaints of pain and asymptomatic. Pt left in bed with call bell in reach and all needs met.   [facility] Public librarian Phone [phone] Secondary Phone [phone]

## 2021-08-21 NOTE — Progress Notes (Addendum)
Patient ID: [name] [name], male   DOB: Dec 06, 1945, 76 y.o.   MRN: [id number]      Advanced Heart Failure Rounding Note  PCP-Cardiologist: Rozann Lesches, MD   Subjective:    01/18 Milrinone stopped. Continues to diurese well, now on PO torsemide, and SCr trending down.   3.6 L in measured UOP + 2 unmeasured voids yesterday. Wt down another 5 lb, 35 lb total.   Creatinine improving, 4.16>>4.23>>4.07>>4.03>>3.92>>3.73.  K 3.3  SB on tele w/ transient junctional bradycardia, HR mid-upper 40s but asymptomatic. On amio 200 mg daily.   Mouth still sore. Sutures still in.    RHC Procedural Findings (milrinone 0.25): Hemodynamics (mmHg) RA mean 11 RV 69/14 PA 66/22, mean 37 PCWP mean 13 Oxygen saturations: PA 75% AO 96% Cardiac Output (Fick) 10.85  Cardiac Index (Fick) 4.23 PVR 2.2 WU  Objective:   Weight Range: 125 kg Body mass index is 36.36 kg/m.   Vital Signs:   Temp:  [97.7 F (36.5 C)] 97.7 F (36.5 C) (01/19 0401) Pulse Rate:  [47-64] 47 (01/19 0401) Resp:  [14-23] 14 (01/19 0401) BP: (110-129)/(44-71) 129/65 (01/19 0401) SpO2:  [96 %-99 %] 98 % (01/19 0739) Weight:  [125 kg] 125 kg (01/19 0100) Last BM Date: 08/20/20  Weight change: Filed Weights   08/19/21 0435 08/20/21 0434 08/21/21 0100  Weight: 130.8 kg 127.2 kg 125 kg    Intake/Output:   Intake/Output Summary (Last 24 hours) at 08/21/2021 0943 Last data filed at 08/21/2021 5573 Gross per 24 hour  Intake 1323 ml  Output 3575 ml  Net -2252 ml      Physical Exam    General:  Well appearing, obese. No respiratory difficulty HEENT: normal Neck: supple. no JVD. Carotids 2+ bilat; no bruits. No lymphadenopathy or thyromegaly appreciated. Cor: PMI nondisplaced. Regular rhythm slow rate. No rubs, gallops or murmurs. Lungs: clear Abdomen: obese, soft, nontender, nondistended. No hepatosplenomegaly. No bruits or masses. Good bowel sounds. Extremities: no cyanosis, clubbing, rash, edema + b/l unna  boots  Neuro: alert & oriented x 3, cranial nerves grossly intact. moves all 4 extremities w/o difficulty. Affect pleasant.    Telemetry   SB mid-upper 40s, transient junctional bradycardia (personally reviewed)  Labs    CBC Recent Labs    08/18/21 1357 08/21/21 0359  WBC  --  6.3  HGB 11.9* 13.1  HCT 35.0* 38.6*  MCV  --  86.0  PLT  --  220*   Basic Metabolic Panel Recent Labs    08/19/21 0418 08/20/21 0457 08/21/21 0359  NA 140 137 138  K 3.5 3.3* 3.3*  CL 100 96* 94*  CO2 27 29 31   GLUCOSE 93 96 84  BUN 64* 65* 67*  CREATININE 4.03* 3.92* 3.73*  CALCIUM 9.0 9.0 8.9  MG 2.0  --   --    Liver Function Tests No results for input(s): AST, ALT, ALKPHOS, BILITOT, PROT, ALBUMIN in the last 72 hours. No results for input(s): LIPASE, AMYLASE in the last 72 hours. Cardiac Enzymes No results for input(s): CKTOTAL, CKMB, CKMBINDEX, TROPONINI in the last 72 hours.  BNP: BNP (last 3 results) Recent Labs    06/11/21 1338 07/15/21 1531 08/14/21 1040  BNP 199.1* 100.1* 342.0*    ProBNP (last 3 results) No results for input(s): PROBNP in the last 8760 hours.   D-Dimer No results for input(s): DDIMER in the last 72 hours. Hemoglobin A1C No results for input(s): HGBA1C in the last 72 hours.  Fasting Lipid Panel  Recent Labs    08/19/21 0418  CHOL 93  HDL 26*  LDLCALC 50  TRIG 86  CHOLHDL 3.6   Thyroid Function Tests No results for input(s): TSH, T4TOTAL, T3FREE, THYROIDAB in the last 72 hours.  Invalid input(s): FREET3   Other results:   Imaging    No results found.   Medications:     Scheduled Medications:  albuterol  2.5 mg Nebulization Once   amiodarone  200 mg Oral Daily   apixaban  5 mg Oral BID   atorvastatin  20 mg Oral Daily   insulin aspart  0-9 Units Subcutaneous TID WC   insulin glargine-yfgn  10 Units Subcutaneous QHS   levothyroxine  300 mcg Oral QAC breakfast   loratadine  10 mg Oral Daily   midodrine  5 mg Oral TID WC    mometasone-formoterol  2 puff Inhalation BID   sodium chloride flush  3 mL Intravenous Q12H   sodium chloride flush  3 mL Intravenous Q12H   torsemide  80 mg Oral Daily    Infusions:  sodium chloride      PRN Medications: sodium chloride, acetaminophen **OR** acetaminophen, nitroGLYCERIN, ondansetron **OR** ondansetron (ZOFRAN) IV, polyvinyl alcohol, sodium chloride, sodium chloride flush, traZODone   Assessment/Plan   1. Acute on chronic HF with mid range EF: Also with prominent RV failure.  In setting of CKD stage IV.  RHC 11/22 RA mean, PCWP mean 18, moderate to severe PAH with PVR 4 WU, CI low at 1.94. Echo this admission with EF 45-50%, mild LV dilation, mild-moderately decreased RV function with mild RVE, PASP 55.  Weight is up about 25 lbs from prior discharge.  He was admitted with worsening dyspnea and edema.  He was started on milrinone 0.25 for RV support and Lasix gtt.  Good diuresis with Lasix gtt + metolazone. Weight down 35 lb.  RHC with near-optimized filling pressures on 1/16. Transitioned to PO diuretics. Milrinone stopped 1/18. Continues to diurese well torsemide, SCr trending down.  - Continue Torsemide 80 mg daily.  K 3.3. Supp today. - Continue midodrine 5 mg tid to keep BP up.  - Unna boots.   2. Pulmonary hypertension:  Moderate to severe PAH with PVR 4 WU on RHC 11/22. 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 No chronic PE on recent V/Q scan. [place] 08/18/21 with moderate PH but low PVR at 2.2 and high CO, suspect pulmonary venous hypertension + high output PH.  - Not candidate for pulmonary vasodilators.  3. Atrial fibrillation: DC-CV in 11/22, started on amiodarone.  He was admitted bradycardic in 40s, concern for underlying junctional rhythm/heart block with regularized afib but baseline poor.  Currently, SB mid-upper 40s w/ transient junctional brady this am - Bisoprolol stopped with bradycardia, leave off.  - Discussed with EP, restarted amiodarone 200 mg daily. May need  reduction to 100 mg daily  - Continue apixaban 5 mg bid.  4. CAD: S/p NSTEMI with occluded OM1 in 6/18, had DES to 80% stenosis mLAD at that time.  No chest pain. No coronary angiography in absence of ACS given CKD stage 4.  - Continue statin.  - No ASA given Eliquis use.  5. AKI on CKD: Stage IV. Baseline creatinine 3.  Creatinine 3.97>4.16>4.23>4.07>4.03> 3.92>3.73. Volume status improved. I am concerned that he may eventually need HD for volume management.  - Plans to f/u with Nephrologist in [place] at discharge 6. Type 2 DM: Per primary service.   7. Chronic hypoxemic respiratory failure: He is  on 3L home oxygen.  Sleep study with only mild OSA in 12/22 but with oxygen desaturation.  Suspect OHS and COPD.  He smoked in the past but not heavily.  - PFTs 08/19/21 c/w severe restriction -Interstitial - f/u w/ outpatient pulmonology  8. Dental pain: Had dental procedure prior to admit. Sutures in place. Reporting pain.  -Did not f/u d/t suspected loss of insurance. TOC following and confirmed he has dental insurance  -Will contact Dr. Raynelle Dick office to request dental consult.  Possible d/c home today after dental sees.   Length of Stay: [address] PA-C  08/21/2021, 9:43 AM  Advanced Heart Failure Team Pager [phone] (M-F; 7a - 5p)  Please contact [facility] for night-coverage after hours (5p -7a ) and weekends on [email or web address]   Patient seen with PA, agree with the above note.    Weight down again.  Creatinine lower 3.92 => 3.73.  Breathing improved. Off milrinone now.   He is currently in NSR with HR about 50. BP stable.    General: NAD Neck: Thick. No JVD, no thyromegaly or thyroid nodule.  Lungs: Clear to auscultation bilaterally with normal respiratory effort. CV: Nondisplaced PMI.  Heart regular S1/S2, no S3/S4, no murmur.  Trace ankle edema. Abdomen: Soft, nontender, no hepatosplenomegaly, no distention.  Skin: Intact without lesions or rashes.  Neurologic: Alert  and oriented x 3.  Psych: Normal affect. Extremities: No clubbing or cyanosis.  HEENT: Normal.    Volume status better, weight down about 35 lbs from peak.  Creatinine down to 3.73.  - Continue torsemide 80 mg daily.  - Continue midodrine 5 tid.    He remains in NSR on amiodarone. HR around 50, will keep amiodarone 200 mg daily for now.    Still with pain at site of tooth extractions and needs sutures out, lost his outpatient dentist.  Have asked hospital dental service to see if possible.    Think he can go home today.  Will need CHF clinic followup.  Meds for discharge: torsemide 80 mg daily, amiodarone 200 daily, apixaban 5 bid, midodrine 5 tid, atorvastatin 20 daily, KCl 20 daily.   [name] 08/21/2021 12:10 PM

## 2021-08-21 NOTE — Consult Note (Signed)
Department of Dental Medicine   Service Date:   08/21/2021 Admit Date:   08/14/2021  Patient Name:  [name] [name] Date of Birth:   [birth year] Medical Record Number: [id number]  Referring Provider:           [name], M.D.   INPATIENT CONSULTATION PLAN/RECOMMENDATIONS   ASSESSMENT 2 silk sutures remain in-tact status-post extractions of mandibular anterior teeth.   The patient appears to be healing well with no signs of wound dehiscence or infection.  PROCEDURES: Suture removal  PLAN: Return to primary dentist for routine care.    Thank you for consulting with Hospital Dentistry and for the opportunity to participate in this patient's treatment.  Should you have any questions or concerns, please contact the [facility] at [phone].       08/21/2021 CONSULT NOTE:   HISTORY OF PRESENT ILLNESS: [name] [name] is a very pleasant 76 y.o. male with h/o hypertension, type 2 diabetes mellitus, COPD, obesity, tobacco use (former smoker- has quit), chronic kidney disease (stage 4), osteoarthritis, OSA, non-Hodgkin's lymphoma treated with chemoradiation therapy, coronary artery disease s/p stent placements, congestive heart failure, atrial fibrillation and long-term use of anticoagulation (on Eliquis) who is currently admitted for acute exacerbation of congestive heart failure.  Hospital dentistry was consulted to evaluate the patient postoperatively status-post extractions of teeth several weeks ago at an outside dental office.   DENTAL HISTORY: The patient reports that he had a few lower front teeth taken out before Christmas at an outside dental office.  He says that he was supposed to return for a postoperative visit to have his sutures removed, but did not make it to that appointment due to  transportation issues and then he was admitted to the hospital.  He reports that he does not plan on returning to that dental office because his insurance has changed, but he does  have a new office that he is going to go to for continued comprehensive care.  He currently denies any dental/orofacial pain or sensitivity. Patient is able to manage oral secretions.  Patient denies dysphagia, odynophagia, dysphonia.   CHIEF COMPLAINT:  "Stitches that need to come out from previous extractions"   Patient Active Problem List   Diagnosis Date Noted   Acute on chronic diastolic (congestive) heart failure ([place]) 08/14/2021   CKD (chronic kidney disease) stage 4, GFR 15-29 ml/min ([place]) 01/21/2021   Pleural effusion 01/20/2021   Thrombocytopenia ([place]) 01/20/2021   Elevated d-dimer 01/20/2021   Atrial fibrillation, chronic ([place]) 01/20/2021   CHF exacerbation ([place]) 12/28/2020   CHF (congestive heart failure) ([place]) 12/28/2020   Acute on chronic diastolic HF (heart failure) ([place]) 10/06/2020   Chronic respiratory failure with hypoxia and hypercapnia ([place]) 09/10/2020   Former smoker 08/10/2020   Acute on chronic diastolic CHF (congestive heart failure) ([place]) 08/05/2020   Acute respiratory failure with hypoxia ([place]) 08/04/2020   Acute exacerbation of CHF (congestive heart failure) ([place]) 07/19/2020   AF (paroxysmal atrial fibrillation) ([place]) 07/19/2020   COVID-19    Acute on chronic respiratory failure with hypoxia and hypercapnia ([place]) 06/25/2020   Aspiration pneumonia ([place]) 06/24/2020   Pneumonia due to COVID-19 virus 06/24/2020   Confusion    Small bowel obstruction ([place]) 01/29/2020   SBO (small bowel obstruction) ([place]) 01/28/2020   Primary osteoarthritis of left knee 05/24/2018   Primary localized osteoarthritis of left knee 05/19/2018   Acute on chronic systolic and diastolic heart failure, NYHA class 1 ([place]) 04/01/2017   CAD (coronary  artery disease) 04/01/2017   DOE (dyspnea on exertion) 03/21/2017   HTN (hypertension) 03/21/2017   NSTEMI (non-ST elevated myocardial infarction) ([place]) 01/19/2017   NSTEMI, initial episode of care ([place]) 01/19/2017   Sepsis ([place])  07/24/2016   Elevated troponin 07/24/2016   Fever 07/24/2016   Lactic acidosis 07/24/2016   Adjustment insomnia 05/18/2016   Erectile dysfunction 12/19/2015   Sleep apnea 09/30/2015   Osteoarthritis 09/30/2015   Hypothyroidism 09/30/2015   Hypogonadism in male 09/30/2015   Diabetic neuropathy ([place]) 09/30/2015   Chronic pain of left knee 09/30/2015   Benign essential hypertension 09/30/2015   Acute on chronic renal failure (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   Pelvic mass in male    Varices, esophageal (HCC)    Colonic mass    Abnormal CT scan, pelvis    Bladder mass    Elevated liver enzymes    Elevated LFTs    Abdominal pain 07/23/2014   Chronic kidney disease Stage IV 07/23/2014   Morbid obesity ([place]) 07/23/2014   Type 2 diabetes mellitus with stage 4 chronic kidney disease ([place]) 07/23/2014   Hypertension 07/23/2014   S/P total knee replacement, 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   Past Medical History:  Diagnosis Date   Atrial fibrillation ([place])    CAD (coronary artery disease)    DES to LAD June 2018   CKD (chronic kidney disease) stage 3, GFR 30-59 ml/min (HCC)    Essential hypertension    History of pneumonia    Hyperlipidemia    Hypothyroidism    LBBB (left bundle branch block)    Morbid obesity ([place])    Non Hodgkin's lymphoma ([place])    Status post XRT and chemotherapy   NSTEMI (non-ST elevated myocardial infarction) [facility])    June 2018   Peripheral neuropathy    Pneumonia due to COVID-19 virus    Sleep apnea    Type 2 diabetes mellitus ([place])    Past Surgical History:  Procedure Laterality Date   CARDIOVERSION N/A 06/20/2021   Procedure: CARDIOVERSION;  Surgeon: Larey Dresser, MD;  Location: [facility];  Service: Cardiovascular;  Laterality: N/A;   CHOLECYSTECTOMY  1992   COLONOSCOPY N/A 09/03/2014   SLF:six colon polyps removed/small internal hemorrhoids   CORONARY STENT  INTERVENTION N/A 01/21/2017   Procedure: Coronary Stent Intervention;  Surgeon: Nelva Bush, MD;  Location: [facility];  Service: Cardiovascular;  Laterality: N/A;   ESOPHAGOGASTRODUODENOSCOPY N/A 09/03/2014   SLF: mild gastritis/few gastric polyps   LEFT HEART CATH AND CORONARY ANGIOGRAPHY N/A 01/20/2017   Procedure: Left Heart Cath and Coronary Angiography;  Surgeon: Jettie Booze, MD;  Location: [facility];  Service: Cardiovascular;  Laterality: N/A;   RIGHT HEART CATH N/A 06/19/2021   Procedure: RIGHT HEART CATH;  Surgeon: Larey Dresser, MD;  Location: [facility];  Service: Cardiovascular;  Laterality: N/A;   RIGHT HEART CATH N/A 08/18/2021   Procedure: RIGHT HEART CATH;  Surgeon: Larey Dresser, MD;  Location: [facility];  Service: Cardiovascular;  Laterality: N/A;   TEE WITHOUT CARDIOVERSION N/A 06/20/2021   Procedure: TRANSESOPHAGEAL ECHOCARDIOGRAM (TEE);  Surgeon: Larey Dresser, MD;  Location: [facility];  Service: Cardiovascular;  Laterality: N/A;   TOTAL KNEE ARTHROPLASTY  11/10/2011   Procedure: TOTAL KNEE ARTHROPLASTY;  Surgeon: Mauri Pole, MD;  Location: [facility];  Service: Orthopedics;  Laterality: Right;   TOTAL KNEE ARTHROPLASTY  Left 05/24/2018   Procedure: LEFT TOTAL KNEE ARTHROPLASTY;  Surgeon: Melrose Nakayama, MD;  Location: [facility];  Service: Orthopedics;  Laterality: Left;   No Known Allergies Current Facility-Administered Medications  Medication Dose Route Frequency Provider Last Rate Last Admin   0.9 %  sodium chloride infusion  250 mL Intravenous PRN Manuella Ghazi, Pratik D, DO       acetaminophen (TYLENOL) tablet 650 mg  650 mg Oral Q6H PRN Manuella Ghazi, Pratik D, DO       Or   acetaminophen (TYLENOL) suppository 650 mg  650 mg Rectal Q6H PRN Manuella Ghazi, Pratik D, DO       albuterol (PROVENTIL) (2.5 MG/3ML) 0.083% nebulizer solution 2.5 mg  2.5 mg Nebulization Once Satira Sark, MD       amiodarone (PACERONE) tablet 200 mg  200 mg Oral  Daily Larey Dresser, MD   200 mg at 08/20/21 1610   apixaban (ELIQUIS) tablet 5 mg  5 mg Oral BID Manuella Ghazi, Pratik D, DO   5 mg at 08/21/21 0820   atorvastatin (LIPITOR) tablet 20 mg  20 mg Oral Daily Lyda Jester M, PA-C   20 mg at 08/21/21 0820   insulin aspart (novoLOG) injection 0-9 Units  0-9 Units Subcutaneous TID WC Hosie Poisson, MD   1 Units at 08/20/21 1704   insulin glargine-yfgn (SEMGLEE) injection 10 Units  10 Units Subcutaneous QHS Hosie Poisson, MD   10 Units at 08/20/21 2141   levothyroxine (SYNTHROID) tablet 300 mcg  300 mcg Oral QAC breakfast Heath Lark D, DO   300 mcg at 08/21/21 9604   loratadine (CLARITIN) tablet 10 mg  10 mg Oral Daily Manuella Ghazi, Pratik D, DO   10 mg at 08/21/21 5409   midodrine (PROAMATINE) tablet 5 mg  5 mg Oral TID WC Larey Dresser, MD   5 mg at 08/21/21 1150   mometasone-formoterol (DULERA) 200-5 MCG/ACT inhaler 2 puff  2 puff Inhalation BID Manuella Ghazi, Pratik D, DO   2 puff at 08/21/21 0739   nitroGLYCERIN (NITROSTAT) SL tablet 0.4 mg  0.4 mg Sublingual Q5 min PRN Manuella Ghazi, Pratik D, DO       ondansetron (ZOFRAN) tablet 4 mg  4 mg Oral Q6H PRN Manuella Ghazi, Pratik D, DO       Or   ondansetron (ZOFRAN) injection 4 mg  4 mg Intravenous Q6H PRN Manuella Ghazi, Pratik D, DO       polyvinyl alcohol (LIQUIFILM TEARS) 1.4 % ophthalmic solution 1 drop  1 drop Both Eyes PRN Hosie Poisson, MD   1 drop at 08/17/21 1000   sodium chloride (OCEAN) 0.65 % nasal spray 1 spray  1 spray Each Nare PRN Manuella Ghazi, Pratik D, DO   1 spray at 08/17/21 2012   sodium chloride flush (NS) 0.9 % injection 3 mL  3 mL Intravenous Q12H Shah, Pratik D, DO   3 mL at 08/20/21 2142   sodium chloride flush (NS) 0.9 % injection 3 mL  3 mL Intravenous PRN Manuella Ghazi, Pratik D, DO       sodium chloride flush (NS) 0.9 % injection 3 mL  3 mL Intravenous Q12H Larey Dresser, MD   3 mL at 08/21/21 8119   torsemide (DEMADEX) tablet 80 mg  80 mg Oral Daily Larey Dresser, MD   80 mg at 08/21/21 0820   traZODone (DESYREL) tablet  50 mg  50 mg Oral QHS PRN Vernelle Emerald, MD   50 mg at 08/17/21 2243    LABS: Lab  Results  Component Value Date   WBC 6.3 08/21/2021   HGB 13.1 08/21/2021   HCT 38.6 (L) 08/21/2021   MCV 86.0 08/21/2021   PLT 138 (L) 08/21/2021      Component Value Date/Time   NA 138 08/21/2021 0359   K 3.3 (L) 08/21/2021 0359   CL 94 (L) 08/21/2021 0359   CO2 31 08/21/2021 0359   GLUCOSE 84 08/21/2021 0359   BUN 67 (H) 08/21/2021 0359   CREATININE 3.73 (H) 08/21/2021 0359   CALCIUM 8.9 08/21/2021 0359   GFRNONAA 16 (L) 08/21/2021 0359   GFRAA 30 (L) 01/31/2020 0500   Lab Results  Component Value Date   INR 1.3 (H) 01/20/2021   INR 1.2 10/06/2020   INR 1.1 07/20/2020   No results found for: PTT  Social History   Socioeconomic History   Marital status: Widowed    Spouse name: Not on file   Number of children: 1   Years of education: Not on file   Highest education level: Some college, no degree  Occupational History   Occupation: retired    Comment: truck Geophysicist/field seismologist  Tobacco Use   Smoking status: Former    Packs/day: 1.00    Years: 30.00    Pack years: 30.00    Types: Cigarettes    Quit date: 10/2019    Years since quitting: 1.8   Smokeless tobacco: Never  Vaping Use   Vaping Use: Never used  Substance and Sexual Activity   Alcohol use: No    Alcohol/week: 0.0 standard drinks   Drug use: No   Sexual activity: Yes  Other Topics Concern   Not on file  Social History Narrative   Not on file   Social Determinants of Health   Financial Resource Strain: Low Risk    Difficulty of Paying Living Expenses: Not very hard  Food Insecurity: No Food Insecurity   Worried About Charity fundraiser in the Last Year: Never true   [place] in the Last Year: Never true  Transportation Needs: No Transportation Needs   Lack of Transportation (Medical): No   Lack of Transportation (Non-Medical): No  Physical Activity: Not on file  Stress: Not on file  Social Connections:  Not on file  Intimate Partner Violence: Not At Risk   Fear of Current or Ex-Partner: No   Emotionally Abused: No   Physically Abused: No   Sexually Abused: No   Family History  Problem Relation Age of Onset   Cancer Mother        breast and lung   Cancer Father        bladder   Cancer Maternal Uncle        prostate   Cancer Paternal Uncle        esophagus   Colon cancer Neg Hx      REVIEW OF SYSTEMS:  Reviewed with the patient as per HPI. Psych:  Patient denies having dental phobia.   VITAL SIGNS: BP 119/68 (BP Location: Right Arm)    Pulse (!) 47    Temp 98.7 F (37.1 C) (Oral)    Resp 17    Ht 6\' 1"  (1.854 m)    Wt 125 kg Comment: scale a   SpO2 98%    BMI 36.36 kg/m    PHYSICAL/DENTAL EXAM: General:  Well-developed, comfortable and in no apparent distress. Neurological:  Alert and oriented to person, place and  time. Extraoral:  Facial symmetry present without any edema or  erythema.  No swelling or lymphadenopathy.  Intraoral:  Soft tissues appear well-perfused and mucous membranes moist.  FOM and vestibules soft and not raised. No signs of infection, parulis, sinus tract, edema or erythema evident upon exam.   2 simple-interrupted style silk sutures remain in tact in the lower anterior region.  The patient appears to be healing well following extractions and consistent with procedures performed.  There are no signs of wound dehiscence or infection.   ASSESSMENT:  1.  Congestive heart failure 2.  Current use of anticoagulation (on Eliquis) 3.  Dental examination 4.  Loss of teeth due to extraction 5.  Postoperative bleeding risk   PLAN AND RECOMMENDATIONS: I discussed various treatment options to include no treatment, suture removal prior to discharge from the hospital or waiting until he sees his primary dentist.  The patient verbalized understanding of all options, and elected for suture removal prior to his discharge due to transportation difficulties. Return  to regular dentist for comprehensive dental care including replacement of missing teeth as needed, cleanings and exams.   PROCEDURES: Suture removal.  Remaining silk-sutures removed without complication bedside. Hemostasis was observed.  Recommend applying firm gauze pressure if any oozing begins or persists throughout the rest of the day.   All questions and concerns were invited and addressed.  The patient tolerated today's visit well and was appreciative for the consult and suture removal.  Cashe Gatt B. Halston Fairclough, D.M.D.

## 2021-08-22 ENCOUNTER — Telehealth: Payer: Self-pay | Admitting: *Deleted

## 2021-08-22 NOTE — Telephone Encounter (Signed)
Attempted to contact patient in reference to his itamar results. After two different attempts, I kept getting a message saying " call cannot be completed." MyChart message will be sent to call the office.

## 2021-08-26 ENCOUNTER — Telehealth: Payer: Self-pay | Admitting: *Deleted

## 2021-08-26 ENCOUNTER — Telehealth (HOSPITAL_COMMUNITY): Payer: Self-pay

## 2021-08-26 NOTE — Telephone Encounter (Signed)
-----   Message from Freada Bergeron, [place] sent at 08/18/2021  6:19 PM EST -----  ----- Message ----- From: Sueanne Margarita, MD Sent: 07/26/2021   6:07 AM EST To: Freada Bergeron, CMA  Patient has mild OSA but given his hx of CHF and severe pulmonary HTN please Order ResMed CPAP on auto from 4 to 15cm H2O with heated humidity, mask of choice and get an overnight pulse ox on PAP therapy.  Followup in 6 weeks with me.

## 2021-08-26 NOTE — Telephone Encounter (Signed)
Called to confirm/remind patient of their appointment at the [facility] on 08/27/21.

## 2021-08-26 NOTE — Progress Notes (Incomplete)
PCP: Nickola Major, MD HF Cardiologist: Dr Aundra Dubin    Reason for Visit: [facility] f/u for chronic combined systolic and diastolic heart failure.   HPI: Mr Mcconathy is a 76 y.o. with history of chronic diastolic CHF, chronic hypoxemic respiratory failure/COPD, CKD stage 4, and persistent atrial fibrillation.  He has had multiple admissions this year for CHF.  He appears to have been in atrial fibrillation since around 12/21, DCCV never attempted.  He had NSTEMI in 6/18, culprit lesion was occluded OM1 that was not intervened on, he had DES to 80% mLAD.  No recent chest pain.  He has CKD stage 4, creatinine has been in the 2.5 range recently.  He is on 2 L home oxygen.  Carries history of COPD but never had PFTs.  ?OHS/OSA.  Most recent echo in 5/22 with EF 55-60%, mild RV enlargement, normal RV systolic function.    He was admitted on 11/9 with weight gain and marked edema as well as hypoxemia.  He was treated with prednisone for 5 days for ?component of COPDE.  Hospital course c/b A fib and AKI. He was started on Lasix 80 mg IV bid. RHC showed mildly elevated right and left heart filling pressures, mod-severe PAH w/ PVR 4 WU and low cardiac index, 1.94. He was diuresed further. Creatinine plateaued ~3.0. Underwent TEE//DC-CV with restoration of NSR. EF on TEE 45-50%, RV mildly reduced.  He was discharged home on torsemide 60 mg bid. Amio 400 mg daily x 2 weeks then 200 mg daily (outpatient PFTs recommended) + Eliquis for a/c. D/c wt 274 lb.   He was seen in the HF clinic 07/2021. Torsemide had been cut back to 40/20 by Nephrology.  Weight at that time was 290 lbs.   Admitted 1/23 with A/C CHF. AHF consulted. He was started on milrinone 0.25 for RV support and Lasix gtt. Underwent RHC showing near-optimized filling pressures. Milrinone weaned off and transitioned to po torsemide (>30 lb diuresis). Course complicated by cardiorenal syndrome, low BP and bradycardia. Nephrology following, SCr  peaked at 4.2, and down to 3.7 on discharge.  Beta blocker stopped, midodrine and amio continued. Discharged home, weight 275 lbs.  Today he returns for post hospital HF follow up. Overall feeling fine. Denies increasing SOB, CP, dizziness, edema, or PND/Orthopnea. Appetite ok. No fever or chills. Weight at home 170 pounds. Taking all medications.   ECG (personally reviewed):  Labs (1/23): K 3.3, creatinine 3.73, HDL 26, LDL 50    PMH: 1. CKD stage 4 2. OSA: Not on CPAP at home.  3. Type 2 diabetes 4. H/o non-Hodgkins lymphoma: Treated with XRT and chemotherapy.  5. Hypothyroidism 6. Atrial fibrillation: Persistent, first noted in 12/21.  - TEE/DCCV (11/22) to NSR 7. COPD: Prior smoker, never had PFTs.  8. Chronic hypoxemic respiratory failure: On 2 L home oxygen.  9. CAD: NSTEMI 6/18 with cath showing occluded OM1 (culprit) and 80% mLAD.  He had DES to mLAD.  10. Chronic diastolic CHF: Echo in 0/10 with EF 55-60%, mild RV enlargement, normal RV systolic function - Echo (11/22): EF 45-50%, mild LV dysfunction, global LV HK, RV moderately reduced, 39 mm ascending aortic dilation - RHC (11/22): moderate to severe PAH, mildly elevated R/L filling pressures, RA mean 10, PCWP mean 18, PVR 4.1 WU, CO/CI 4.91/1.94 - Echo (1/23): EF 45-50%, mild LV dilation, mild-moderately decreased RV function with mild RVE, PASP 55.  - RHC (1/23): on milrinone 0.25, RA mean 11, PCWP mean  13, pulmonary venous hypertension/high output PH with PVR 2.2 WU, CO/CI 10.85/4.23  ROS: All systems negative except as listed in HPI, PMH and Problem List.   Social History   Socioeconomic History   Marital status: Widowed    Spouse name: Not on file   Number of children: 1   Years of education: Not on file   Highest education level: Some college, no degree  Occupational History   Occupation: retired    Comment: truck Geophysicist/field seismologist  Tobacco Use   Smoking status: Former    Packs/day: 1.00    Years: 30.00    Pack years:  30.00    Types: Cigarettes    Quit date: 10/2019    Years since quitting: 1.9   Smokeless tobacco: Never  Vaping Use   Vaping Use: Never used  Substance and Sexual Activity   Alcohol use: No    Alcohol/week: 0.0 standard drinks   Drug use: No   Sexual activity: Yes  Other Topics Concern   Not on file  Social History Narrative   Not on file   Social Determinants of Health   Financial Resource Strain: Low Risk    Difficulty of Paying Living Expenses: Not very hard  Food Insecurity: No Food Insecurity   Worried About Charity fundraiser in the Last Year: Never true   [place] in the Last Year: Never true  Transportation Needs: No Transportation Needs   Lack of Transportation (Medical): No   Lack of Transportation (Non-Medical): No  Physical Activity: Not on file  Stress: Not on file  Social Connections: Not on file  Intimate Partner Violence: Not At Risk   Fear of Current or Ex-Partner: No   Emotionally Abused: No   Physically Abused: No   Sexually Abused: No   Family History  Problem Relation Age of Onset   Cancer Mother        breast and lung   Cancer Father        bladder   Cancer Maternal Uncle        prostate   Cancer Paternal Uncle        esophagus   Colon cancer Neg Hx     Current Outpatient Medications on File Prior to Visit  Medication Sig Dispense Refill   acetaminophen (TYLENOL) 325 MG tablet Take 2 tablets (650 mg total) by mouth every 6 (six) hours as needed for mild pain (or Fever >/= 101). 30 tablet 1   amiodarone (PACERONE) 200 MG tablet Take 200 mg by mouth daily.     apixaban (ELIQUIS) 5 MG TABS tablet Take 1 tablet (5 mg total) by mouth 2 (two) times daily. This is a dose change 60 tablet 6   atorvastatin (LIPITOR) 20 MG tablet Take 1 tablet (20 mg total) by mouth daily. 30 tablet 0   fluticasone (FLONASE) 50 MCG/ACT nasal spray Place 2 sprays into both nostrils daily as needed for allergies.     insulin degludec (TRESIBA FLEXTOUCH) 200  UNIT/ML FlexTouch Pen Inject 16 Units into the skin daily.     ipratropium-albuterol (DUONEB) 0.5-2.5 (3) MG/3ML SOLN Inhale 3 mLs into the lungs every 4 (four) hours as needed (shortness of breath).     levocetirizine (XYZAL) 5 MG tablet Take 5 mg by mouth daily.     levothyroxine (SYNTHROID) 100 MCG tablet Take 100 mcg by mouth daily before breakfast. (Takes with 200 mcg tab for a total of 300 mcg once daily)     levothyroxine (  SYNTHROID, LEVOTHROID) 200 MCG tablet Take 300 mcg by mouth daily before breakfast. (takes with 153mcg tab for a total of 343mcg)     midodrine (PROAMATINE) 5 MG tablet Take 1 tablet (5 mg total) by mouth 3 (three) times daily with meals. 90 tablet 0   nitroGLYCERIN (NITROSTAT) 0.4 MG SL tablet Place 0.4 mg under the tongue every 5 (five) minutes as needed for chest pain.   11   OXYGEN Inhale 3-4 L into the lungs continuous.     potassium chloride (KLOR-CON M) 10 MEQ tablet Take 2 tablets (20 mEq total) by mouth daily. 60 tablet 0   senna-docusate (SENOKOT-S) 8.6-50 MG tablet Take 1 tablet by mouth daily as needed for mild constipation.     sodium chloride (OCEAN) 0.65 % SOLN nasal spray Place 1 spray into both nostrils as needed for congestion.     SYMBICORT 160-4.5 MCG/ACT inhaler Inhale 2 puffs into the lungs daily as needed (shortness of breath).     torsemide 40 MG TABS Take 80 mg by mouth daily. 60 tablet 1   [DISCONTINUED] insulin aspart (NOVOLOG) 100 UNIT/ML injection Inject 0-15 Units into the skin 3 (three) times daily with meals. (Patient not taking: No sig reported) 13.5 mL 3   No current facility-administered medications on file prior to visit.   There were no vitals taken for this visit.  Wt Readings from Last 3 Encounters:  08/21/21 125 kg  07/15/21 131.9 kg  06/26/21 124.6 kg   Physical Exam: General:  NAD. No resp difficulty HEENT: Normal Neck: Supple. No JVD. Carotids 2+ bilat; no bruits. No lymphadenopathy or thryomegaly appreciated. Cor: PMI  nondisplaced. Regular rate & rhythm. No rubs, gallops or murmurs. Lungs: Clear Abdomen: Soft, nontender, nondistended. No hepatosplenomegaly. No bruits or masses. Good bowel sounds. Extremities: No cyanosis, clubbing, rash, edema Neuro: Alert & oriented x 3, cranial nerves grossly intact. Moves all 4 extremities w/o difficulty. Affect pleasant.   ASSESSMENT & PLAN: 1. Acute on chronic HF with mid range EF: Also with prominent RV failure.  In setting of CKD stage IV.  RHC 11/22 RA mean, PCWP mean 18, moderate to severe PAH with PVR 4 WU, CI low at 1.94. Echo this admission with EF 45-50%, mild LV dilation, mild-moderately decreased RV function with mild RVE, PASP 55.  Weight is up about 25 lbs from prior discharge.  He was admitted with worsening dyspnea and edema.  He was started on milrinone 0.25 for RV support and Lasix gtt.  Good diuresis with Lasix gtt + metolazone. Weight down 35 lb.  RHC with near-optimized filling pressures on 1/16. Transitioned to PO diuretics. Milrinone stopped 1/18. Continues to diurese well torsemide, SCr trending down.  - Continue Torsemide 80 mg daily.  K 3.3. Supp today. - Continue midodrine 5 mg tid to keep BP up.  - Unna boots.   2. Pulmonary hypertension:  Moderate to severe PAH with PVR 4 WU on RHC 11/22. 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 No chronic PE on recent V/Q scan. [place] 08/18/21 with moderate PH but low PVR at 2.2 and high CO, suspect pulmonary venous hypertension + high output PH.  - Not candidate for pulmonary vasodilators.  3. Atrial fibrillation: DC-CV in 11/22, started on amiodarone.  He was admitted bradycardic in 40s, concern for underlying junctional rhythm/heart block with regularized afib but baseline poor.  Currently, SB mid-upper 40s w/ transient junctional brady this am - Bisoprolol stopped with bradycardia, leave off.  - Discussed with EP, restarted  amiodarone 200 mg daily. May need reduction to 100 mg daily  - Continue apixaban 5 mg bid.  4.  CAD: S/p NSTEMI with occluded OM1 in 6/18, had DES to 80% stenosis mLAD at that time.  No chest pain. No coronary angiography in absence of ACS given CKD stage 4.  - Continue statin.  - No ASA given Eliquis use.  5. AKI on CKD: Stage IV. Baseline creatinine 3.  Creatinine 3.97>4.16>4.23>4.07>4.03> 3.92>3.73. Volume status improved. I am concerned that he may eventually need HD for volume management.  - Plans to f/u with Nephrologist in [place] at discharge 6. Type 2 DM: Per primary service.   7. Chronic hypoxemic respiratory failure: He is on 3L home oxygen.  Sleep study with only mild OSA in 12/22 but with oxygen desaturation.  Suspect OHS and COPD.  He smoked in the past but not heavily.  - PFTs 08/19/21 c/w severe restriction -Interstitial - f/u w/ outpatient pulmonology  8. Dental pain: Had dental procedure prior to admit. Sutures in place. Reporting pain.  -Did not f/u d/t suspected loss of insurance. TOC following and confirmed he has dental insurance  -Will contact Dr. Raynelle Dick office to request dental consult.   Possible d/c home today after dental sees.    Length of Stay: [address] PA-C  08/21/2021, 9:43 AM

## 2021-08-26 NOTE — Telephone Encounter (Signed)
Attempted to contact patient to discuss sleep study results and recommendations. Phone call would not connect. Kept getting message " call cannot be completed at this time." MyChart message will be sent to patient asking him to call me.

## 2021-08-27 ENCOUNTER — Encounter (HOSPITAL_COMMUNITY): Payer: Medicare HMO

## 2021-09-03 ENCOUNTER — Telehealth: Payer: Self-pay | Admitting: *Deleted

## 2021-09-03 NOTE — Telephone Encounter (Signed)
Attempted to call patient to discuss sleep study results. Phone still will not connect. Patient has not responded to the [employer]. Attempted to call daughter's number and got the same message.

## 2021-09-03 DEATH — deceased

## 2021-09-10 ENCOUNTER — Telehealth: Payer: Self-pay | Admitting: *Deleted

## 2021-09-10 NOTE — Telephone Encounter (Signed)
Attempted to contact patient to discuss sleep study results. Call will not go through. Will send a mychart message.

## 2021-09-11 ENCOUNTER — Encounter (HOSPITAL_COMMUNITY): Payer: Medicare HMO | Admitting: Cardiology

## 2021-09-16 ENCOUNTER — Telehealth: Payer: Self-pay | Admitting: *Deleted

## 2021-09-16 NOTE — Telephone Encounter (Deleted)
My chart message sent to patient to contact the office to discuss his sleep study results.

## 2021-09-16 NOTE — Telephone Encounter (Signed)
Error

## 2021-09-19 ENCOUNTER — Telehealth: Payer: Self-pay | Admitting: *Deleted

## 2021-09-19 NOTE — Telephone Encounter (Signed)
-----   Message from Freada Bergeron, [place] sent at 08/18/2021  6:19 PM EST -----  ----- Message ----- From: Sueanne Margarita, MD Sent: 07/26/2021   6:07 AM EST To: Freada Bergeron, CMA  Patient has mild OSA but given his hx of CHF and severe pulmonary HTN please Order ResMed CPAP on auto from 4 to 15cm H2O with heated humidity, mask of choice and get an overnight pulse ox on PAP therapy.  Followup in 6 weeks with me.

## 2021-09-23 ENCOUNTER — Other Ambulatory Visit (HOSPITAL_COMMUNITY): Payer: Self-pay | Admitting: *Deleted

## 2021-09-26 ENCOUNTER — Telehealth: Payer: Self-pay | Admitting: *Deleted

## 2021-09-26 NOTE — Telephone Encounter (Signed)
Attempted to contact the patient to inform of sleep study results and recommendations. Received a message voicemail box has not been se up. I have made multiple attempts to contact the patient. No further attempts will be made.

## 2022-11-05 IMAGING — US US RENAL
1 series · 14 of 25 positions shown · non-contrast
Comparison: CT abdomen pelvis 01/28/2020

CLINICAL DATA: Acute kidney injury.  Lymphoma

EXAM:
RENAL / URINARY TRACT ULTRASOUND COMPLETE

[Series 1: us renal · 0.31mm/px · 14 of 60 slices shown]
[im 1/60]
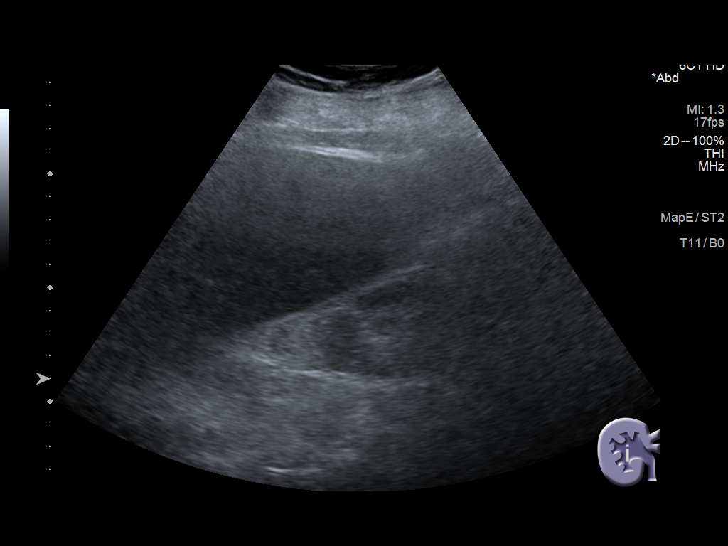
[im 5/60]
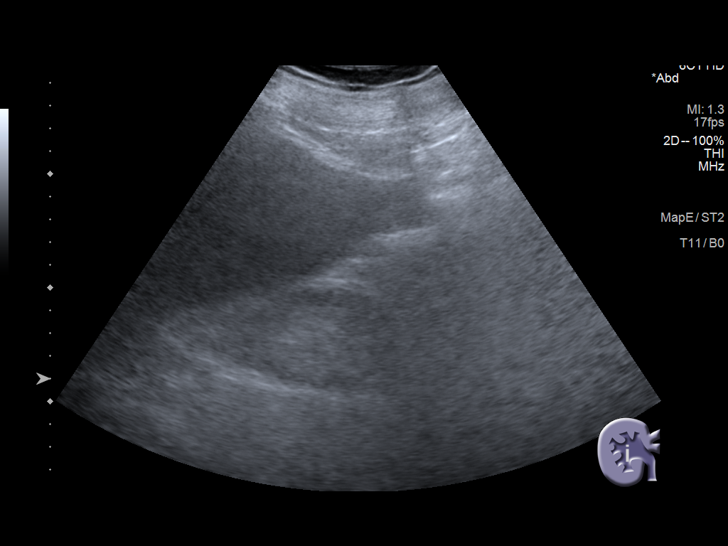
[im 10/60]
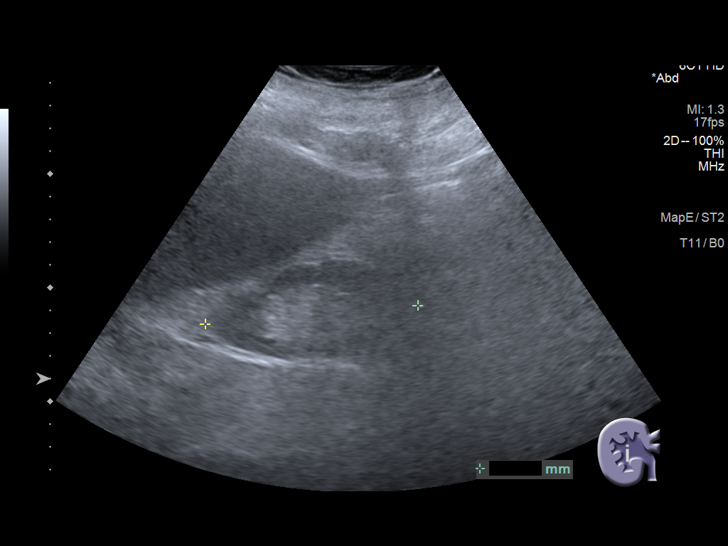
[im 15/60]
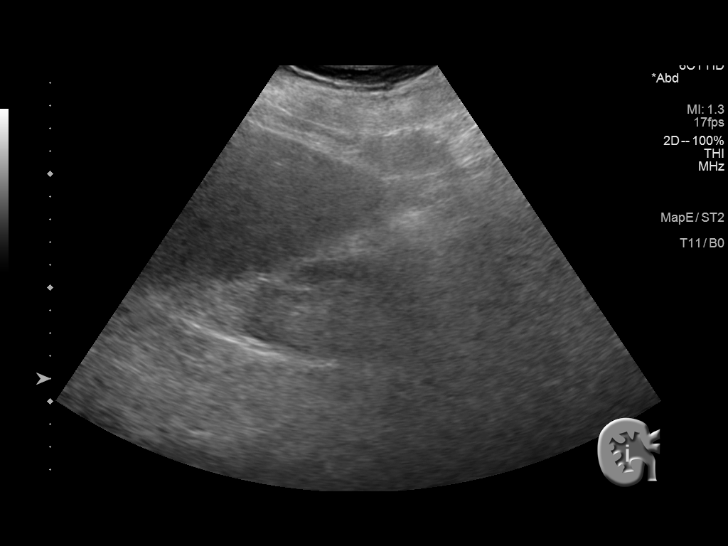
[im 20/60]
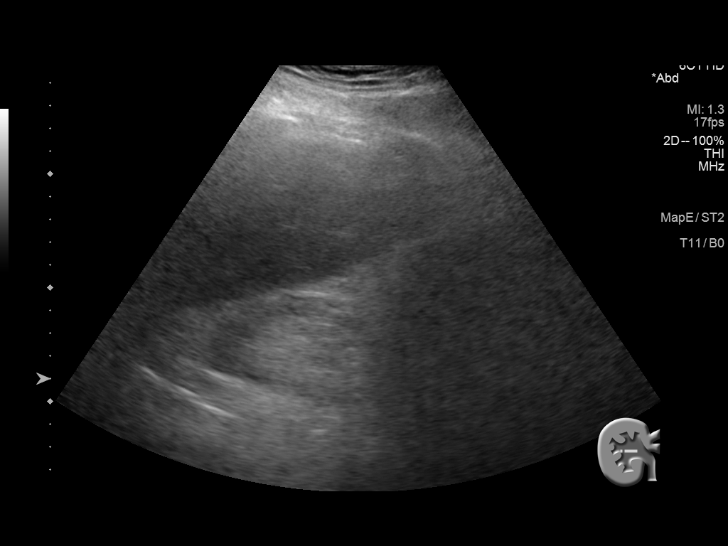
[im 23/60]
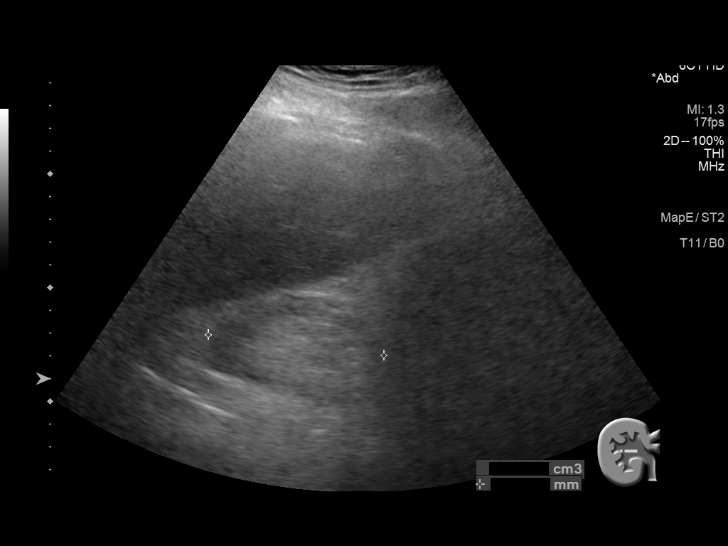
[im 28/60]
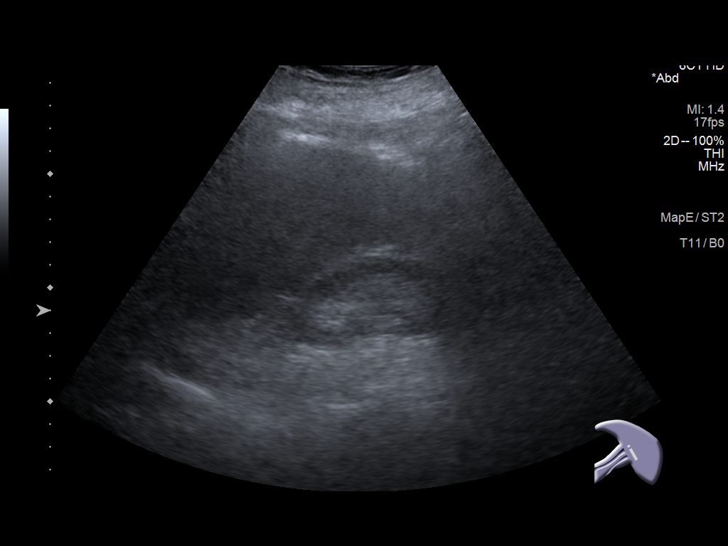
[im 32/60]
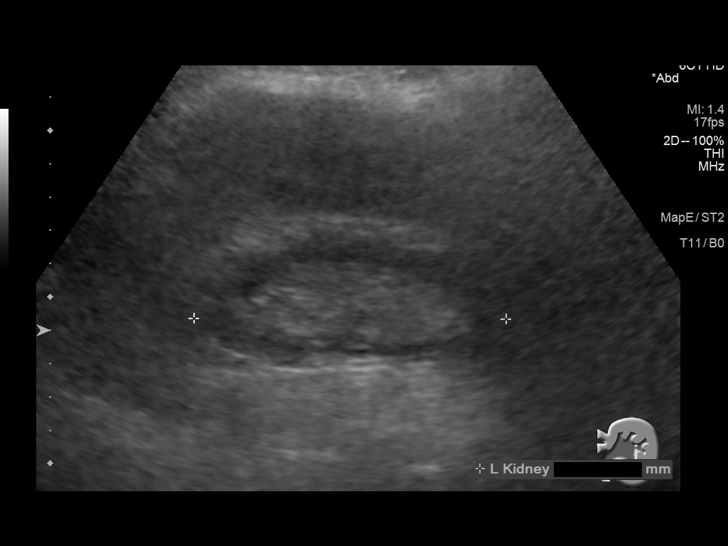
[im 37/60]
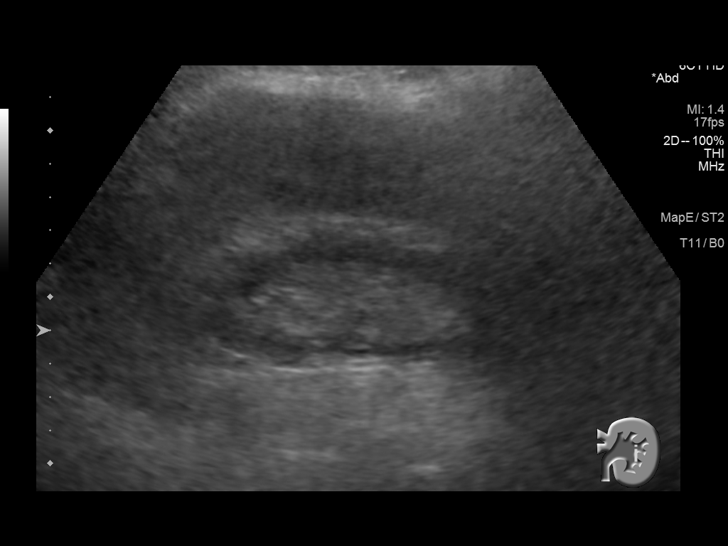
[im 40/60]
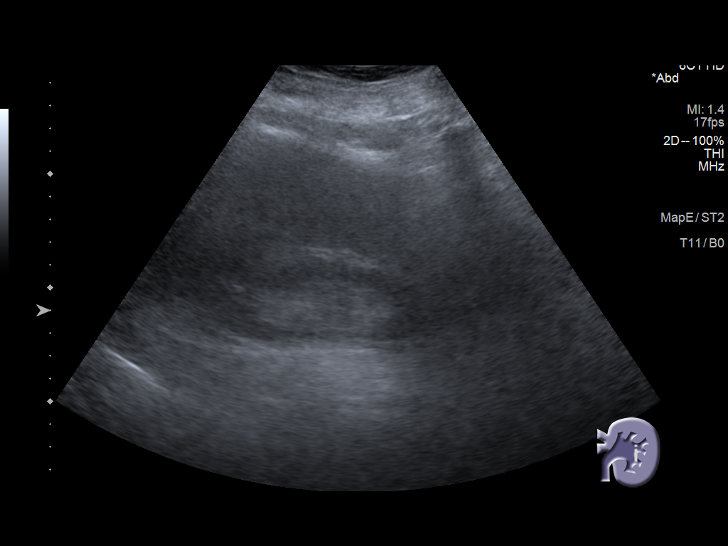
[im 45/60]
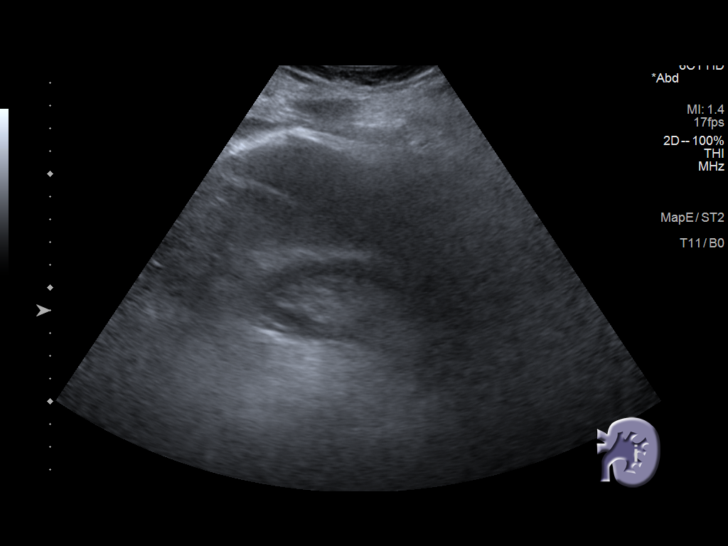
[im 50/60]
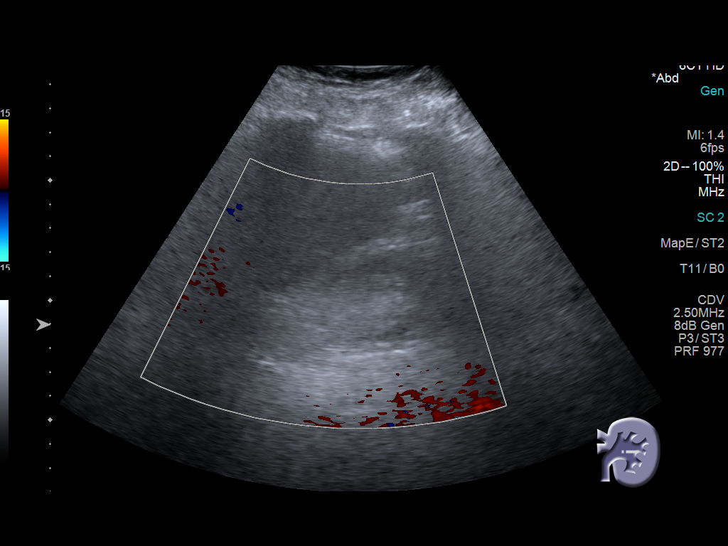
[im 55/60]
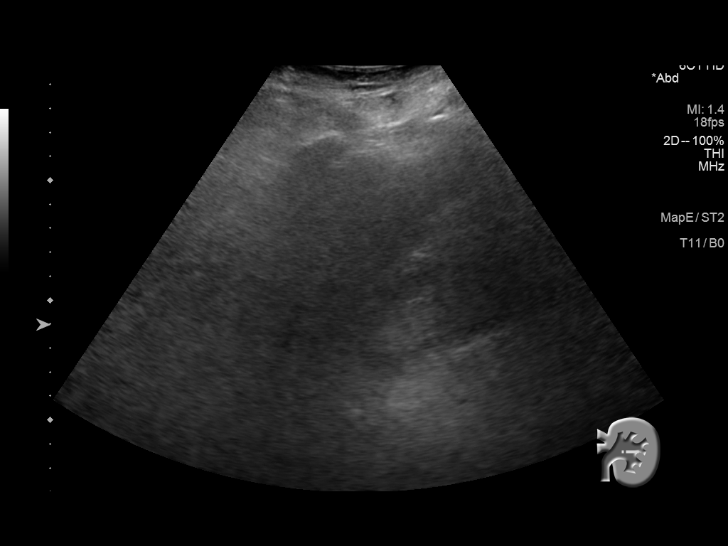
[im 60/60]
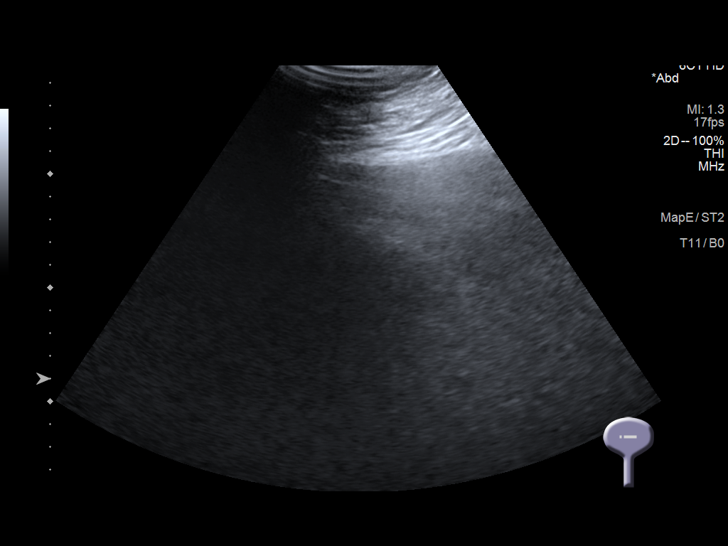

[14 of 25 positions shown; findings below may reference images not displayed]

FINDINGS: Right Kidney:

Renal measurements: 9.3 x 4.4 x 7.8 cm = volume: 164 mL.
Echogenicity within normal limits. No mass or hydronephrosis
visualized.

Left Kidney:

Renal measurements: 9.1 x 3.7 x 4.5 cm = volume: 81 mL. Echogenicity
within normal limits. No mass or hydronephrosis visualized.

Bladder:

Empty bladder.

Other:

Exam quality limited by obesity.
IMPRESSION: No renal obstruction.  Left kidney smaller than right.
